# Patient Record
Sex: Female | Born: 1935 | Race: White | Hispanic: No | Smoking: Never smoker
Health system: Southern US, Community
[De-identification: ages and names within clinical notes are randomized; demographics above are authoritative.]

## PROBLEM LIST (undated history)

## (undated) DIAGNOSIS — I219 Acute myocardial infarction, unspecified: Secondary | ICD-10-CM

## (undated) DIAGNOSIS — N179 Acute kidney failure, unspecified: Secondary | ICD-10-CM

## (undated) DIAGNOSIS — J189 Pneumonia, unspecified organism: Secondary | ICD-10-CM

## (undated) DIAGNOSIS — I10 Essential (primary) hypertension: Secondary | ICD-10-CM

## (undated) DIAGNOSIS — I509 Heart failure, unspecified: Secondary | ICD-10-CM

## (undated) DIAGNOSIS — K219 Gastro-esophageal reflux disease without esophagitis: Secondary | ICD-10-CM

## (undated) DIAGNOSIS — I341 Nonrheumatic mitral (valve) prolapse: Secondary | ICD-10-CM

## (undated) DIAGNOSIS — F0390 Unspecified dementia without behavioral disturbance: Secondary | ICD-10-CM

## (undated) DIAGNOSIS — I471 Supraventricular tachycardia, unspecified: Secondary | ICD-10-CM

## (undated) DIAGNOSIS — K922 Gastrointestinal hemorrhage, unspecified: Secondary | ICD-10-CM

## (undated) DIAGNOSIS — I059 Rheumatic mitral valve disease, unspecified: Secondary | ICD-10-CM

## (undated) DIAGNOSIS — K5520 Angiodysplasia of colon without hemorrhage: Secondary | ICD-10-CM

## (undated) DIAGNOSIS — N183 Chronic kidney disease, stage 3 unspecified: Secondary | ICD-10-CM

## (undated) DIAGNOSIS — N1832 Chronic kidney disease, stage 3b: Secondary | ICD-10-CM

## (undated) DIAGNOSIS — E876 Hypokalemia: Secondary | ICD-10-CM

## (undated) DIAGNOSIS — C189 Malignant neoplasm of colon, unspecified: Secondary | ICD-10-CM

## (undated) DIAGNOSIS — Z950 Presence of cardiac pacemaker: Secondary | ICD-10-CM

## (undated) DIAGNOSIS — M1990 Unspecified osteoarthritis, unspecified site: Secondary | ICD-10-CM

## (undated) DIAGNOSIS — Z8489 Family history of other specified conditions: Secondary | ICD-10-CM

## (undated) DIAGNOSIS — Z8701 Personal history of pneumonia (recurrent): Secondary | ICD-10-CM

## (undated) DIAGNOSIS — I350 Nonrheumatic aortic (valve) stenosis: Secondary | ICD-10-CM

## (undated) DIAGNOSIS — N390 Urinary tract infection, site not specified: Secondary | ICD-10-CM

## (undated) DIAGNOSIS — D649 Anemia, unspecified: Secondary | ICD-10-CM

## (undated) DIAGNOSIS — R011 Cardiac murmur, unspecified: Secondary | ICD-10-CM

## (undated) DIAGNOSIS — K31819 Angiodysplasia of stomach and duodenum without bleeding: Secondary | ICD-10-CM

## (undated) DIAGNOSIS — N189 Chronic kidney disease, unspecified: Secondary | ICD-10-CM

## (undated) DIAGNOSIS — I4891 Unspecified atrial fibrillation: Secondary | ICD-10-CM

## (undated) DIAGNOSIS — Z5189 Encounter for other specified aftercare: Secondary | ICD-10-CM

## (undated) DIAGNOSIS — I639 Cerebral infarction, unspecified: Secondary | ICD-10-CM

## (undated) DIAGNOSIS — IMO0001 Reserved for inherently not codable concepts without codable children: Secondary | ICD-10-CM

## (undated) HISTORY — PX: NASAL SINUS SURGERY: SHX719

## (undated) HISTORY — DX: Supraventricular tachycardia, unspecified: I47.10

## (undated) HISTORY — DX: Cerebral infarction, unspecified: I63.9

## (undated) HISTORY — DX: Chronic kidney disease, stage 3b: N18.32

## (undated) HISTORY — DX: Acute kidney failure, unspecified: N17.9

## (undated) HISTORY — DX: Urinary tract infection, site not specified: N39.0

## (undated) HISTORY — DX: Unspecified dementia, unspecified severity, without behavioral disturbance, psychotic disturbance, mood disturbance, and anxiety: F03.90

## (undated) HISTORY — PX: PACEMAKER INSERTION: SHX728

## (undated) HISTORY — DX: Nonrheumatic aortic (valve) stenosis: I35.0

## (undated) HISTORY — DX: Rheumatic mitral valve disease, unspecified: I05.9

## (undated) HISTORY — DX: Chronic kidney disease, unspecified: N18.9

## (undated) HISTORY — DX: Angiodysplasia of stomach and duodenum without bleeding: K31.819

## (undated) HISTORY — DX: Chronic kidney disease, stage 3 unspecified: N18.30

## (undated) HISTORY — DX: Presence of cardiac pacemaker: Z95.0

## (undated) HISTORY — DX: Nonrheumatic mitral (valve) prolapse: I34.1

## (undated) HISTORY — PX: APPENDECTOMY: SHX54

## (undated) HISTORY — DX: Malignant neoplasm of colon, unspecified: C18.9

## (undated) HISTORY — DX: Acute myocardial infarction, unspecified: I21.9

## (undated) HISTORY — PX: BLADDER SURGERY: SHX569

## (undated) HISTORY — PX: EYE SURGERY: SHX253

## (undated) HISTORY — PX: CARDIAC SURGERY: SHX584

## (undated) HISTORY — DX: Personal history of pneumonia (recurrent): Z87.01

## (undated) HISTORY — DX: Essential (primary) hypertension: I10

## (undated) HISTORY — DX: Gastrointestinal hemorrhage, unspecified: K92.2

## (undated) HISTORY — DX: Supraventricular tachycardia: I47.1

## (undated) HISTORY — DX: Hypokalemia: E87.6

## (undated) HISTORY — DX: Angiodysplasia of colon without hemorrhage: K55.20

## (undated) HISTORY — DX: Encounter for other specified aftercare: Z51.89

## (undated) SURGERY — ESOPHAGOGASTRODUODENOSCOPY (EGD) WITH PROPOFOL
Anesthesia: Monitor Anesthesia Care

---

## 1998-04-28 ENCOUNTER — Other Ambulatory Visit: Admission: RE | Admit: 1998-04-28 | Discharge: 1998-04-28 | Payer: Self-pay | Admitting: Family Medicine

## 1998-06-23 ENCOUNTER — Encounter: Payer: Self-pay | Admitting: Family Medicine

## 1998-06-23 ENCOUNTER — Ambulatory Visit (HOSPITAL_COMMUNITY): Admission: RE | Admit: 1998-06-23 | Discharge: 1998-06-23 | Payer: Self-pay | Admitting: Obstetrics & Gynecology

## 1999-07-25 ENCOUNTER — Ambulatory Visit (HOSPITAL_COMMUNITY): Admission: RE | Admit: 1999-07-25 | Discharge: 1999-07-25 | Payer: Self-pay | Admitting: Family Medicine

## 1999-07-25 ENCOUNTER — Encounter: Payer: Self-pay | Admitting: Family Medicine

## 2000-07-31 ENCOUNTER — Encounter: Payer: Self-pay | Admitting: Family Medicine

## 2000-07-31 ENCOUNTER — Ambulatory Visit (HOSPITAL_COMMUNITY): Admission: RE | Admit: 2000-07-31 | Discharge: 2000-07-31 | Payer: Self-pay | Admitting: Family Medicine

## 2001-08-22 ENCOUNTER — Encounter: Payer: Self-pay | Admitting: Obstetrics & Gynecology

## 2001-08-22 ENCOUNTER — Ambulatory Visit (HOSPITAL_COMMUNITY): Admission: RE | Admit: 2001-08-22 | Discharge: 2001-08-22 | Payer: Self-pay | Admitting: Obstetrics & Gynecology

## 2001-08-30 ENCOUNTER — Inpatient Hospital Stay (HOSPITAL_COMMUNITY): Admission: EM | Admit: 2001-08-30 | Discharge: 2001-08-30 | Payer: Self-pay | Admitting: Internal Medicine

## 2002-02-06 ENCOUNTER — Encounter (HOSPITAL_BASED_OUTPATIENT_CLINIC_OR_DEPARTMENT_OTHER): Payer: Self-pay | Admitting: Internal Medicine

## 2002-02-06 ENCOUNTER — Inpatient Hospital Stay (HOSPITAL_COMMUNITY): Admission: EM | Admit: 2002-02-06 | Discharge: 2002-02-08 | Payer: Self-pay | Admitting: *Deleted

## 2002-08-27 ENCOUNTER — Ambulatory Visit (HOSPITAL_COMMUNITY): Admission: RE | Admit: 2002-08-27 | Discharge: 2002-08-27 | Payer: Self-pay | Admitting: Obstetrics & Gynecology

## 2002-08-27 ENCOUNTER — Encounter: Payer: Self-pay | Admitting: Obstetrics & Gynecology

## 2003-08-30 ENCOUNTER — Ambulatory Visit (HOSPITAL_COMMUNITY): Admission: RE | Admit: 2003-08-30 | Discharge: 2003-08-30 | Payer: Self-pay | Admitting: Family Medicine

## 2004-09-01 ENCOUNTER — Ambulatory Visit (HOSPITAL_COMMUNITY): Admission: RE | Admit: 2004-09-01 | Discharge: 2004-09-01 | Payer: Self-pay | Admitting: Family Medicine

## 2005-09-03 ENCOUNTER — Ambulatory Visit (HOSPITAL_COMMUNITY): Admission: RE | Admit: 2005-09-03 | Discharge: 2005-09-03 | Payer: Self-pay | Admitting: Family Medicine

## 2006-08-19 ENCOUNTER — Ambulatory Visit: Payer: Self-pay | Admitting: Internal Medicine

## 2006-08-19 ENCOUNTER — Encounter (INDEPENDENT_AMBULATORY_CARE_PROVIDER_SITE_OTHER): Payer: Self-pay | Admitting: *Deleted

## 2006-08-19 ENCOUNTER — Ambulatory Visit (HOSPITAL_COMMUNITY): Admission: RE | Admit: 2006-08-19 | Discharge: 2006-08-19 | Payer: Self-pay | Admitting: Internal Medicine

## 2006-09-05 ENCOUNTER — Ambulatory Visit (HOSPITAL_COMMUNITY): Admission: RE | Admit: 2006-09-05 | Discharge: 2006-09-05 | Payer: Self-pay | Admitting: Family Medicine

## 2007-09-23 ENCOUNTER — Ambulatory Visit (HOSPITAL_COMMUNITY): Admission: RE | Admit: 2007-09-23 | Discharge: 2007-09-23 | Payer: Self-pay | Admitting: Family Medicine

## 2008-06-14 ENCOUNTER — Ambulatory Visit (HOSPITAL_COMMUNITY): Admission: RE | Admit: 2008-06-14 | Discharge: 2008-06-14 | Payer: Self-pay | Admitting: Ophthalmology

## 2008-09-24 ENCOUNTER — Ambulatory Visit (HOSPITAL_COMMUNITY): Admission: RE | Admit: 2008-09-24 | Discharge: 2008-09-24 | Payer: Self-pay | Admitting: Family Medicine

## 2009-09-26 ENCOUNTER — Ambulatory Visit (HOSPITAL_COMMUNITY): Admission: RE | Admit: 2009-09-26 | Discharge: 2009-09-26 | Payer: Self-pay | Admitting: Family Medicine

## 2010-08-28 ENCOUNTER — Other Ambulatory Visit: Payer: Self-pay

## 2010-08-28 DIAGNOSIS — Z139 Encounter for screening, unspecified: Secondary | ICD-10-CM

## 2010-09-29 ENCOUNTER — Ambulatory Visit (HOSPITAL_COMMUNITY)
Admission: RE | Admit: 2010-09-29 | Discharge: 2010-09-29 | Disposition: A | Discharge status: Home / Self Care | Payer: Medicare Other | Source: Ambulatory Visit | Attending: Family Medicine | Admitting: Family Medicine

## 2010-09-29 DIAGNOSIS — Z1231 Encounter for screening mammogram for malignant neoplasm of breast: Secondary | ICD-10-CM | POA: Insufficient documentation

## 2010-09-29 DIAGNOSIS — Z139 Encounter for screening, unspecified: Secondary | ICD-10-CM

## 2010-10-04 ENCOUNTER — Other Ambulatory Visit (HOSPITAL_COMMUNITY): Payer: Self-pay | Admitting: Family Medicine

## 2010-10-04 DIAGNOSIS — N6310 Unspecified lump in the right breast, unspecified quadrant: Secondary | ICD-10-CM

## 2010-10-11 ENCOUNTER — Ambulatory Visit (HOSPITAL_COMMUNITY)
Admission: RE | Admit: 2010-10-11 | Discharge: 2010-10-11 | Disposition: A | Discharge status: Home / Self Care | Payer: Medicare Other | Source: Ambulatory Visit | Attending: Family Medicine | Admitting: Family Medicine

## 2010-10-11 ENCOUNTER — Other Ambulatory Visit (HOSPITAL_COMMUNITY): Payer: Self-pay | Admitting: Family Medicine

## 2010-10-11 DIAGNOSIS — N6310 Unspecified lump in the right breast, unspecified quadrant: Secondary | ICD-10-CM

## 2010-10-11 DIAGNOSIS — R928 Other abnormal and inconclusive findings on diagnostic imaging of breast: Secondary | ICD-10-CM | POA: Insufficient documentation

## 2010-10-11 DIAGNOSIS — N6009 Solitary cyst of unspecified breast: Secondary | ICD-10-CM | POA: Insufficient documentation

## 2010-11-10 NOTE — Discharge Summary (Signed)
Crystal Cooper. [facility]  Patient:    Crystal Cooper, Crystal Cooper Visit Number: [id number] MRN: [id number]          Service Type: MED Location: [id number] Attending Physician:  Cristopher Peru Dictated by:   Mikey Bussing, P.A. Admit Date:  08/29/2001 Disc. Date: 08/30/01   CC:         Youlanda Roys. Deatra Ina, M.D.   Referring Physician Discharge [place]:  August 31, 1926  BRIEF HISTORY:  This is a patient referred to Dr. Lovena Le for spells of rapid heart rate.  Her symptoms date back to age 65.  She has occasional "spells" which were rare and typically did not last long and were associated with a feeling of weakness.  She has lived with these for quite some time.  In 1989 she was at work and had the sensation of her heart racing and was subsequently diagnosed with mitral valve prolapse and started on beta blockers.  Over the six months prior to this admission she had two prolonged episodes of rapid heart rate which occurred suddenly and ended suddenly.  She has had many episodes which will last for brief periods of time - typically one to two minutes.  Two spells have been associated with hypotension with systolic blood pressures in the 85 to 90 range and heart rate between 200 and 240 beats per minute.  She has worn a 30-day monitor and was found to have no symptomatic arrhythmias while wearing the monitor.  She was referred for evaluation.  Dr. Lovena Le suspected that she had SVT and recommended radiofrequency ablation. She was admitted to [facility] for this procedure.  PAST MEDICAL HISTORY:  Significant for hypertension, remote history of rheumatic fever as a child, history of bladder surgery as well as sinus surgery, and she is status post appendectomy.  She has had three spontaneous vaginal deliveries.  ALLERGIES:  CODEINE.  MEDICATIONS PRIOR TO ADMISSION:  Altace 10 mg daily.  SOCIAL HISTORY:  The patient lives alone.  She has supportive family  members. She denies tobacco or alcohol use.  She still drives.  She no longer uses caffeine.  HOSPITAL COURSE:  As noted, this patient was admitted to [facility] for a radiofrequency ablation for SVT.  She was taken to the EP lab on the day of admission and underwent ablation performed by Dr. Lovena Le, which she tolerated well.  Arrangements were made to discharge the patient the following day; however, she had some nausea and vomiting.  She was kept in the hospital until August 30, 2001.  She is currently feeling better at this time.  She had a Foley catheter which was removed; however, she has not voided since the catheter was removed.  If she is able to void, she will be discharged later today.  LABORATORY DATA:  An EKG on March 8 showed normal sinus rhythm, rate 69 beats per minute, and was interpreted as a normal EKG.  DISCHARGE MEDICATIONS: 1. Altace 10 mg daily. 2. Enteric-coated aspirin 325 mg daily for six weeks. 3. The patient was to be on SBE prophylaxis with antibiotics prior to any    surgical or dental procedures x 3 months.  ACTIVITY:  The patient was told to avoid any strenuous activity or heavy lifting for four days, no driving for two days.  DIET:  She was to be on a low fat/low cholesterol/low salt diet.  WOUND CARE:  She was told she could remove her dressings  and shower when she was back home.  She was to call if she had any drainage from her wound.  FOLLOW-UP:  The patient was to follow up with [facility] in six to eight weeks; the office will call her for an appointment.  She will follow up with Dr. Deatra Ina as needed or as scheduled.  PROBLEM LIST AT TIME OF DISCHARGE: 1. Supraventricular tachycardia, symptomatic since age 71. 2. Status post radiofrequency ablation performed August 29, 2001. 3. History of hypertension. Dictated by:   Mikey Bussing, P.A. Attending Physician:  Cristopher Peru DD:  08/30/01 TD:  08/30/01 Job: [id number]

## 2010-11-10 NOTE — Discharge Summary (Signed)
NAME:  Crystal Cooper, Crystal Cooper                         ACCOUNT NO.:  [id number]   MEDICAL RECORD NO.:  [id number]                   PATIENT TYPE:  INP   LOCATION:  5151                                 FACILITY:  [facility]   PHYSICIAN:  Ricard Dillon, M.D.             DATE OF BIRTH:  10-07-1926   DATE OF ADMISSION:  02/06/2002  DATE OF DISCHARGE:  02/08/2002                                 DISCHARGE SUMMARY   ID:  This is a 77 year old female patient.   CHIEF COMPLAINT:  Coughing x 1 month.  Vomiting x 3 days.   HISTORY OF PRESENT ILLNESS:  This is a normally 77 year old healthy woman  who has been sick for one month.  This started with episodes of shortness of  breath and intense spells of coughing productive of white sputum.  There is  no fever.  No hemoptysis.  She complained of sinus congestion but no GERD-  like symptoms.  She has no history of asthma or COPD.  She is a nonsmoker  but a long history of second-hand smoke exposure.  She was seen in the  primary care office 10 days ago and prescribed treatment for bronchitis  which did not help.  She was seen again in the office on Monday, was given  Cipro and prednisone and an intramuscular injury presumably Solu-Medrol.  She felt better on Tuesday, however, she started vomiting Tuesday night.  Most of the vomiting sounded post tussive although I wondered whether some  of this could have had a more primary cause on admission.  She denies  hemoptysis, abdominal pain or dysphagia.   On arrival to ER her chest x-ray was found to be normal, however, he sodium  was 117.   PAST MEDICAL HISTORY:  1. Hypertension.  2. Rheumatic fever.  3. Sinus surgery.  4. Appendectomy remotely.  5. SVT status post radiofrequency ablation August 29, 2001 (Dr. Lovena Le).   MEDICATIONS ON ADMISSION:  Altace 10 mg q.d., prednisone 40 mg p.o. q.d.  started on Monday, Cipro 500 mg b.i.d.   SOCIAL HISTORY:  She lives alone.  No recent changes in environment.   PHYSICAL EXAMINATION:  VITAL SIGNS:  Temperature 98.9, pulse 66,  respirations 22.  O2 sats were 97% on room air.  HEENT:  Edentulous.  NECK:  Normal.  CARDIOVASCULAR:  Heart sounds were normal, soft, S4.  No murmurs.  No  bruits.  RESPIRATORY:  Air entry was normal.  No wheezes or crackles were heard.  ABDOMEN:  No liver.  No spleen.  She was nontender.  BREASTS:  Deferred as she had a normal mammogram in January '03.  EXTREMITIES:  Normal peripheral pulses.  No evidence of a DVT.  CNS:  Equal strength bilaterally.  She is alert and oriented.   LABORATORY WORK:  On admission sodium was 117, potassium 3.5, chloride 81,  CO2 28, BUN 8, creatinine 0.7.  White count 10, hemoglobin  13, platelets  326.  Her liver function tests were normal.  Calcium was 8.9, albumin 3.6.  Total CK 109.  Lipase was 42.  A PA and lateral chest x-ray was done which  was normal.  Sinus films were normal.  EKG showed poor R wave progression  that was not new, slight inversion of T waves in the high lateral chest  leads were also felt probably to be chronic.   LABORATORY AND OTHER INVESTIGATIONS:  Sodium on admission was 117.  Urine  osmolality was 196.  Serum osmolality was 244.  Urine sodium was 71.  Sodium  was repeated on 02/07/02 and this was 129, potassium 4.5.  Her CBC was  normal.   COURSE IN THE HOSPITAL:  This patient was admitted with what was felt to be  asthmatic bronchitis.  This settled down with Ventolin nebulizers, however,  the patient is still complaining of episodic spasms of coughing and a  sensation of tightness in her chest.  She certainly looks considerably  better than on admission, however.  We felt that she would be best treated  with bronchodilators and inhaled steroids.  Due to cost considerations, I  did not order an Advair diskus, however, we will discharge her on Flovent  and Ventolin inhalers.  She might be able to get samples from her primary  care office this week.   The  vomiting settled down on its own.  She is tolerating an oral diet.  I  think most of this was post tussive.   With regards to the hyponatremia I think this is largely secondary to one  and two above.  Her studies were not compatible with SIADH and this  corrected with isotonic fluid and continues to correct even after IVs were  stopped.   IMPRESSION:  1. Asthmatic bronchitis.  I am uncertain whether this is woman is going to     have further problems with this or this could be an isolated episode.  2. No evidence of an infectious component here.  She is not discharged on     antibiotics.  3. The vomiting is largely post tussive.  4. Hyponatremia currently resolved.   DISCHARGE MEDICATIONS:  Flovent inhaler 44 two puffs b.i.d.  Ventolin  inhaler 2 puffs q.i.d. and p.r.n.  Her Altace that she was on on admission  is on hold because of the cough variant.  I really doubt that this is what  is behind this.  I will leave this to Dr. Deatra Ina to restart as necessary and  follow.   DISPOSITION:  The patient is discharged in stable condition.  Advised to  follow up with primary, Dr. Deatra Ina, this week.                                               Ricard Dillon, M.D.    MGR/MEDQ  D:  02/08/2002  T:  02/11/2002  Job:  [id number]   cc:   Dr. Deatra Ina

## 2010-11-10 NOTE — H&P (Signed)
NAME:  Crystal Cooper, Crystal Cooper NO.:  [id number]   MEDICAL RECORD NO.:  [id number]                   PATIENT TYPE:  EMS   LOCATION:  MAJO                                 FACILITY:  [facility]   PHYSICIAN:  Ricard Dillon, M.D.             DATE OF BIRTH:  [birth year]   DATE OF ADMISSION:  02/06/2002  DATE OF DISCHARGE:                                HISTORY & PHYSICAL   ID:  This is a 77 year old woman who is a primary patient of Dr. Kaplan's,  [place].   CHIEF COMPLAINT:  Coughing times one month, vomiting times three days.   HISTORY OF PRESENT ILLNESS:  This is a normally healthy, 77 year old woman  who has been sick roughly for a month.  This started with spasms of episodic  shortness of breath and intense spells of coughing productive of whitish  sputum.  She had no fever, no hemoptysis, no gastroesophageal reflux  symptoms, but did have sinus congestion and drainage.  There is no history  of asthma or COPD, although, during her married life she had an intense  secondary smoke exposure.  She was seen in her primary MD's office 10 days  ago and prescribed Amoxil for bronchitis, which did not help.  She was seen  again in the office on Monday of this week was given Cipro, p.o. Prednisone,  and IM injection (presumably steroids).  She felt better on Tuesday,  however, started vomiting intensely Tuesday night and could not keep  anything on her stomach.  Most of this sounds posttussive, however, she was  also nauseated predominantly independent of coughing.  She has been unable  to keep much down.  There is no hemoptysis, no abdominal pain, no dysphagia,  and no dysuria.   She has been arrived in the ER after vomiting and coughing all night.  Her  sodium is 117.  Chest x-ray PA and lateral has been done, which is entirely  normal.   PAST MEDICAL HISTORY:  Hypertension on all Altace, rheumatic fever as a  child, sinus surgery unknown date  and surgeon, appendectomy, remotely  supraventricular tachycardia status-post radiofrequency ablation on  08/29/2001, (Dr. Cristopher Peru).   CURRENT MEDICATIONS:  Altace 10 mg p.o. q.d., Prednisone 22 p.o. q.d.  started on Monday, Cipro 500 b.i.d. also started on Monday.   SOCIAL HISTORY:  She lives alone.  No recent changes in her environment  exposures, etc.   PHYSICAL EXAMINATION:  VITAL SIGNS:  Temperature 98.9, blood pressure  166/82, pulse 70, respirations 16, O2 sats 97%.  HEENT:  She is edentulous.  Mucus membranes are moist.  NECK:  Normal.  No masses felt.  CVS:  S1, S2 normal.  Positive S4 is heard.  There is no murmurs, no bruits.  She appears euvolemic.  RESPIRATORY:  Air entry is quite normal.  There is no wheezing.  No  crackles.  Forced expiratory maneuver  was attempted.  Again I did not  appreciate significant bronchospasm.  ABDOMEN:  No liver.  No spleen.  She is nontender.  BREAST:  Deferred, as she had a normal mammogram in January 2003.  EXTREMITIES:  Normal peripheral pulses.  There is no edema.  CNS:  Equal strength.  She is alert and oriented.  Nothing seems focal.   LABORATORY WORK TO DATE:  Sodium 117, potassium 3.5, chloride 81, CO2 28,  BUN 8, creatinine 0.7, white count 10, hemoglobin 13, platelets 326, liver  function tests are normal, calcium 8.9, albumin 3.6, lipase is normal at 42,  total CK is 109.  Chest x-ray PA and lateral shows no evidence of an acute  infiltrate.  EKG shows sinus rhythm.  Poor R wave progression (not new since  March).  She has developed T wave inversion in the high lateral leads.  This  was flat in March or isoelectric in March, perhaps somewhat worse, although,  there is no evidence of exertional chest symptoms.   IMPRESSION:  1. Probable asthmatic bronchitis secondary to a viral illness.  There is no     evidence of bacterial pneumonia or bronchitis at present.  I cannot     completely rule out other illnesses such as  gastroesophageal reflux,     sinusitis, etc.  2. Intense vomiting.  Most of this sound posttussive probably secondary to     #1, however, she is still nauseated now.  I would also wonder about a     drug effect, Quinolones or Prednisone, both can do this.  Her abdomen     seems benign.  There is no reason at this point to suspect a primary GI     problem here, although, one would wonder about esophageal reflux     contributing to her coughing.  3. Hyponatremia.  I think this is probably secondary largely to #1 and #2.     As such should correct with isotonic fluid.  It seems reasonable to do a     spot urine for sodium and osmolality, however.   PLAN:  She will see nebulizers, fluids, and antibiotics.  I see no need for  IV steroids now at present, as I do not appreciate any wheezing.  However,  ultimately PFTs may prove useful here.                                               Ricard Dillon, M.D.    MGR/MEDQ  D:  02/06/2002  T:  02/09/2002  Job:  [id number]   cc:   Youlanda Roys. Deatra Ina, M.D.

## 2010-11-10 NOTE — Op Note (Signed)
Crystal Cooper, Crystal Cooper               ACCOUNT NO.:  [id number]   MEDICAL RECORD NO.:  [id number]          PATIENT TYPE:  AMB   LOCATION:  DAY                           FACILITY:  [facility]   PHYSICIAN:  R. Garfield Cornea, M.D. DATE OF BIRTH:  08-07-1926   DATE OF PROCEDURE:  08/19/2006  DATE OF DISCHARGE:                               OPERATIVE REPORT   PROCEDURE:  Colonoscopy with biopsy.   INDICATIONS FOR PROCEDURE:  The patient is a 77 year old lady referred  at courtesy Dr. Tollie Pizza over at [facility] for  colorectal cancer screening.  She has never had her lower GI tract  imaged previously.  There is no family history of colon cancer, and she  has no lower GI tract symptoms.  Colonoscopy is now being done as a  screening maneuver.  This approach has been discussed with the patient  at length.  Potential risks, benefits, and alternatives have been  reviewed.  Please see the documentation in the medical record.   PROCEDURE NOTE:  O2 saturation, blood pressure, pulse was monitored  throughout the entire procedure.  Conscious sedation Versed 4 mg IV  Demerol 50 mg IV in divided doses.  Instrument Pentax video chip system.   FINDINGS:  Digital rectal exam revealed no abnormalities.   Endoscopic Findings:  Prep was good.  Scope was advanced from the  rectosigmoid junction through the left transverse right colon to the  area of appendiceal orifice, ileocecal valve and cecum where these  structures were well seen and photographed for the record.  From this  level, the scope was slowly withdrawn, and all previous mentioned  mucosal surfaces were again seen.   The patient had the following abnormalities:  1. Scattered sigmoid diverticula.  2. Two diminutive 3 mm polyps at the hepatic flexure and 2 in the      descending colon, all of which were cold biopsy/removed.  The      remainder of colonic mucosa appeared normal.  Scope was then the      rectum where a thoroughly  examination of the rectal mucosa      including retroflex view of the anal verge was undertaken.  The      rectal mucosa appeared normal.  The patient tolerated the procedure      well, was reacted in endoscopy.   IMPRESSION:  Normal rectum, sigmoid diverticula diminutive colonic  polyps removed as described above.  Remainder of colonic mucosa appeared  normal.   RECOMMENDATIONS:  1. Diverticulosis literature provided to [name].  2. Follow up on path.  3. Further recommendations to follow.      Bridgette Habermann, M.D.  Electronically Signed     RMR/MEDQ  D:  08/19/2006  T:  08/19/2006  Job:  [id number]   cc:   Juanita Craver, M.D.  Fax: [phone]

## 2010-11-10 NOTE — Op Note (Signed)
[place]  Patient:    Crystal Cooper, Crystal Cooper Visit Number: [id number] MRN: [id number]          Service Type: MED Location: [id number] Attending Physician:  Cristopher Peru Dictated by:   Champ Mungo. Lovena Le, M.D. [facility] Proc. Date: 08/29/01 Admit Date:  08/29/2001 Discharge Date: 08/30/2001   CC:         Youlanda Roys. Deatra Ina, M.D.  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  INDICATION:  Recurrent tachypalpitations and documented heart rates in the 200 range.  I. INTRODUCTION:  The patient is a very pleasant 77 year old young lady who was referred by Dr. Elson Clan for evaluation of tachypalpitations.  The patient states that her symptoms start and stop suddenly and have been present off and on for many, many years.  While wearing an automatic blood pressure cuff, her heart rate was said to be 220 beats per minute with a blood pressure of 85 associated with dizziness and palpitations.  There was no actual documented SVT, but because her symptoms are very typical for SVT, and she had a documentation of very rapid heart rate, she is referred for electrophysiologic testing.  PHYSICIAN:  Champ Mungo. Lovena Le, M.D.  II. PROCEDURE:  After informed consent was obtained, the patient was taken to the diagnostic EP lab in the fasting state.  After the usual preparation and draping, intravenous fentanyl and midazolam was given for sedation.  A 6-French hexapolar catheter was inserted percutaneously in the right jugular vein and advanced to the coronary sinus.  A 5-French quadripolar catheter was inserted percutaneously in the right femoral vein and advanced to the RV apex. A 5-French quadripolar catheter was inserted percutaneously in the right femoral vein and advanced to the His bundle region.  After measurement of the basic intervals,  rapid ventricular pacing was carried out from the RV apex at a pacing cycle length of 500 msec and stepwise decreased down to 310 msec where V-A Wenckebach was observed.  During rapid ventricular pacing, the atrial activation sequence was midline and decremental.  Next, programmed ventricular stimulation was carried out from the RV apex at a base adjusted cycle length of 500 msec.  The S1 and S2 interval was stepwise decreased from 440 msec down to 230 msec where the retrograde A-V node ERP was observed. During programmed ventricular stimulation, the atrial activation sequence was again midline and decremental.  Next, programmed atrial stimulation was carried out from the coronary sinus with a base adjusted cycle length of 500 msec.  The S1-S2 interval was stepwise decreased down to 210 msec where the ERP of the A-V node was observed.  It should be noted that during programmed atrial stimulation, there were multiple A-H jumps and echo beats including double and triple echo beats.  Next, rapid atrial pacing was carried out from the coronary sinus with a base adjusted cycle length of 500 msec and stepwise decreased down 390 msec where A-V Wenckebach was observed.  During rapid atrial pacing, there was nonsustained SVT.  Isoproterenol at 10 cc/min (approximately 0.5 mcg per minute) was subsequently infused, and additional rapid atrial pacing resulted in the initiation of SVT.  The tachycardia cycle length varied between 280 and 343 msec.  During tachycardia, mapping demonstrated the earliest atrial activation to be in the His bundle region. The V-A interval was very short, less than 30 msec.  PVCs placed at  the time of His bundle refractions did not result in pre-excitation of the atrium, and the ventricular pacing resulted in a VAV conduction sequence.  In addition, the atrial activation was midline.  With the diagnosis of A-V node re-entrant tachycardia in hand, the 7-French quadripolar  ablation catheter was inserted through the right femoral vein and advanced into the right atrium.  Mapping was carried out in the right atrium at sites 7 and 8 in Kochs triangle. Three RF energy applications were delivered.  During RF energy application, there were prolonged episodes of accelerated junctional rhythm.  Following this, additional rapid atrial pacing was carried out demonstrating no evidence of any residual dual A-V nodal physiology.  The A-V Wenckebach cycle length increased to 590 msec.  At this point, the catheters were removed.  Hemostasis was assured, and the patient was returned to her room in satisfactory condition.  III. COMPLICATIONS:  None.  IV:  RESULTS:      A. Baseline EHG.  The baseline EHG demonstrated normal sinus rhythm and         normal axis and intervals.      B. Baseline intervals.  The sinus node cycle length was 737 msec.         The A-H interval 91 msec, the H-V interval 36 msec, and the         Q-R-S restoration 94 msec.      C. Rapid ventricular pacing.  Rapid ventricular pacing was carried out         from the RV apex at a base adjusted cycle length of 500 msec and         stepwise decreased down to 310 msec where V-A Wenckebach was         observed.  During rapid ventricular pacing, the atrial activation         sequence was midline and decremental.      D. Programmed ventricular stimulation.  Programmed ventricular         stimulation was carried out from the RV apex at a base adjusted         cycle length of 500 msec.  The S1-S2 interval was stepwise decreased         from 440 msec down to 230 msec where the ERP of the ventricle was         observed.  During programmed ventricular stimulation, the atrial         activation sequence was again midline and decremental.  There were no         obvious V-A jumps.      E. Programmed atrial stimulation.  Programmed atrial stimulation was          carried out from the coronary sinus at a base  adjusted cycle         length of 500 msec.  The S1-S2 interval was stepwise decreased         from 440 msec down to 210 msec where the ERP of the A-V node         was observed.  During programmed atrial stimulation, there were         multiple A-H jump and echo and double echo beats.      F. Rapid atrial pacing.  Rapid atrial pacing was carried out from the         coronary sinus both with and without isoproterenol.  The A-V         Wenckebach cycle  length was 390 msec.  During isoproterenol infusion,         and with rapid atrial pacing, there was inducible A-V re-entrant         tachycardia.      G. Arrhythmias observed.         a. A-V node re-entrant tachycardia.  Initiation rapid            atrial pacing during isoproterenol infusion, duration            sustained, termination spontaneous and with ventricular            pacing.         b. Atrial fibrillation/flutter.  Initiation spontaneous            during SVT.  Duration sustained, termination spontaneous.      H. Mapping.  Mapping of the patients SVT demonstrated the earliest         atrial activation down in Kochs triangle.  In addition, mapping         demonstrated a normal size and orientation of Kochs triangle.      I. RF energy application.  A total of three RF energy applications were         subsequently delivered to sites 7 and 8 in Kochs triangle.  During         RF energy application, there was accelerated junctional rhythm.  V. CONCLUSIONS:  This study demonstrates inducible A-V node re-entrant tachycardia.  In addition, there was spontaneous occurring atrial fibrillation/flutter during A-V node re-entrant tachycardia.  The clinical significance of this arrhythmia is unknown.  Finally, after three RF energy applications, there was no evidence of any residual dual A-V nodal physiology. ictated by:   Champ Mungo. Lovena Le, M.D. [facility] Attending Physician:  Cristopher Peru DD:  08/29/01 TD:  09/01/01 Job: [id number]

## 2011-07-03 ENCOUNTER — Encounter: Payer: Self-pay | Admitting: Cardiology

## 2011-07-26 ENCOUNTER — Encounter: Payer: Self-pay | Admitting: Internal Medicine

## 2011-09-06 ENCOUNTER — Other Ambulatory Visit (HOSPITAL_COMMUNITY): Payer: Self-pay | Admitting: Family Medicine

## 2011-09-06 DIAGNOSIS — Z139 Encounter for screening, unspecified: Secondary | ICD-10-CM

## 2011-09-24 ENCOUNTER — Encounter: Payer: Self-pay | Admitting: Cardiology

## 2011-10-12 ENCOUNTER — Ambulatory Visit (HOSPITAL_COMMUNITY)
Admission: RE | Admit: 2011-10-12 | Discharge: 2011-10-12 | Disposition: A | Discharge status: Home / Self Care | Payer: Medicare Other | Source: Ambulatory Visit | Attending: Family Medicine | Admitting: Family Medicine

## 2011-10-12 DIAGNOSIS — Z139 Encounter for screening, unspecified: Secondary | ICD-10-CM

## 2011-10-12 DIAGNOSIS — Z1231 Encounter for screening mammogram for malignant neoplasm of breast: Secondary | ICD-10-CM | POA: Insufficient documentation

## 2011-10-18 ENCOUNTER — Encounter: Payer: Self-pay | Admitting: Cardiology

## 2011-10-18 ENCOUNTER — Encounter: Payer: Self-pay | Admitting: *Deleted

## 2011-10-19 ENCOUNTER — Ambulatory Visit (HOSPITAL_COMMUNITY): Payer: Medicare Other | Attending: Internal Medicine

## 2011-10-19 ENCOUNTER — Ambulatory Visit (INDEPENDENT_AMBULATORY_CARE_PROVIDER_SITE_OTHER): Payer: Medicare Other | Admitting: Cardiology

## 2011-10-19 ENCOUNTER — Encounter: Payer: Self-pay | Admitting: Cardiology

## 2011-10-19 ENCOUNTER — Other Ambulatory Visit: Payer: Self-pay

## 2011-10-19 VITALS — BP 152/80 | HR 80 | Ht 64.0 in | Wt 126.0 lb

## 2011-10-19 DIAGNOSIS — R011 Cardiac murmur, unspecified: Secondary | ICD-10-CM | POA: Insufficient documentation

## 2011-10-19 DIAGNOSIS — I059 Rheumatic mitral valve disease, unspecified: Secondary | ICD-10-CM | POA: Insufficient documentation

## 2011-10-19 DIAGNOSIS — I10 Essential (primary) hypertension: Secondary | ICD-10-CM

## 2011-10-19 DIAGNOSIS — Z9889 Other specified postprocedural states: Secondary | ICD-10-CM | POA: Insufficient documentation

## 2011-10-19 DIAGNOSIS — R0609 Other forms of dyspnea: Secondary | ICD-10-CM | POA: Insufficient documentation

## 2011-10-19 DIAGNOSIS — R079 Chest pain, unspecified: Secondary | ICD-10-CM | POA: Insufficient documentation

## 2011-10-19 DIAGNOSIS — R0989 Other specified symptoms and signs involving the circulatory and respiratory systems: Secondary | ICD-10-CM | POA: Insufficient documentation

## 2011-10-19 DIAGNOSIS — I498 Other specified cardiac arrhythmias: Secondary | ICD-10-CM

## 2011-10-19 DIAGNOSIS — I471 Supraventricular tachycardia: Secondary | ICD-10-CM

## 2011-10-19 NOTE — Assessment & Plan Note (Signed)
Continue present blood pressure medications. 

## 2011-10-19 NOTE — Assessment & Plan Note (Signed)
Patient sounds to have an aortic stenosis murmur. Plan echocardiogram to better assess.

## 2011-10-19 NOTE — Progress Notes (Signed)
  HPI: 78 year-old female for evaluation of murmur. Patient does have a history of ablation of AVNRT in 2003. She does have chronic dyspnea on exertion but no orthopnea or PND. Occasional mild pedal edema. No syncope. She also has problems with chest pain. It is substernal and described as a pressing pain. No radiation. Last several minutes and resolve spontaneously. Not exertional. No associated symptoms. She was recently noted to have a worsening murmur. Cardiology was asked to further evaluate.  Current Outpatient Prescriptions  Medication Sig Dispense Refill  . albuterol (PROVENTIL HFA;VENTOLIN HFA) 108 (90 BASE) MCG/ACT inhaler Inhale 2 puffs into the lungs every 4 (four) hours as needed.      [name] amLODipine (NORVASC) 5 MG tablet Take 5 mg by mouth daily.      . MULTIPLE VITAMIN PO Take by mouth.        Allergies  Allergen Reactions  . Codeine   . Hctz (Hydrochlorothiazide)     Past Medical History  Diagnosis Date  . HTN (hypertension)   . Asthma   . Glaucoma   . Hearing loss   . Prolapsing mitral leaflet syndrome   . SVT (supraventricular tachycardia)     S/P ablation of AVNRT in 2003    Past Surgical History  Procedure Date  . Appendectomy   . Bladder surgery   . Cardiac surgery   . Nasal sinus surgery     History   Social History  . Marital Status: Widowed    Spouse Name: N/A    Number of Children: 3  . Years of Education: N/A   Occupational History  . Not on file.   Social History Main Topics  . Smoking status: Never Smoker   . Smokeless tobacco: Not on file  . Alcohol Use: No  . Drug Use: No  . Sexually Active: Not on file   Other Topics Concern  . Not on file   Social History Narrative  . No narrative on file    Family History  Problem Relation Age of Onset  . Coronary artery disease Brother     MI at age 41    ROS: Some arthralgias but no fevers or chills, productive cough, hemoptysis, dysphasia, odynophagia, melena, hematochezia, dysuria,  hematuria, rash, seizure activity, orthopnea, PND, claudication. Remaining systems are negative.  Physical Exam:   Blood pressure 152/80, pulse 80, height 5\' 4"  (1.626 m), weight 57.153 kg (126 lb).  General:  Well developed/somewhat frail in NAD Skin warm/dry Patient not depressed No peripheral clubbing Back-normal HEENT-normal/normal eyelids Neck supple/normal carotid upstroke bilaterally; no JVD; no thyromegaly chest - CTA/ normal expansion CV - RRR/normal S1 and S2; no rubs or gallops;  PMI nondisplaced; 3/6 systolic murmur left sternal border. S2 is not diminished. There is radiation to the carotids. Abdomen -NT/ND, no HSM, no mass, + bowel sounds, no bruit 2+ femoral pulses, no bruits Ext-trace edema, no chords, 2+ DP, varicosities noted Neuro-grossly nonfocal  ECG normal sinus rhythm with no ST changes.

## 2011-10-19 NOTE — Patient Instructions (Signed)
Your physician wants you to follow-up in: [place] will receive a reminder letter in the mail two months in advance. If you don't receive a letter, please call our office to schedule the follow-up appointment.   Your physician has requested that you have a lexiscan myoview. For further information please visit [email or web address]. Please follow instruction sheet, as given.   Your physician has requested that you have an echocardiogram. Echocardiography is a painless test that uses sound waves to create images of your heart. It provides your doctor with information about the size and shape of your heart and how well your heart's chambers and valves are working. This procedure takes approximately one hour. There are no restrictions for this procedure.

## 2011-10-19 NOTE — Assessment & Plan Note (Signed)
Symptoms atypical. Schedule Myoview for risk stratification.

## 2011-10-19 NOTE — Assessment & Plan Note (Signed)
Status post ablation. 

## 2011-11-01 ENCOUNTER — Other Ambulatory Visit (HOSPITAL_COMMUNITY): Payer: Medicare Other

## 2011-12-31 ENCOUNTER — Ambulatory Visit (HOSPITAL_COMMUNITY): Payer: Medicare Other | Attending: Cardiology | Admitting: Radiology

## 2011-12-31 VITALS — BP 143/79 | HR 74 | Ht 64.0 in | Wt 127.0 lb

## 2011-12-31 DIAGNOSIS — Z8249 Family history of ischemic heart disease and other diseases of the circulatory system: Secondary | ICD-10-CM | POA: Insufficient documentation

## 2011-12-31 DIAGNOSIS — R0609 Other forms of dyspnea: Secondary | ICD-10-CM | POA: Insufficient documentation

## 2011-12-31 DIAGNOSIS — I4949 Other premature depolarization: Secondary | ICD-10-CM

## 2011-12-31 DIAGNOSIS — I10 Essential (primary) hypertension: Secondary | ICD-10-CM | POA: Insufficient documentation

## 2011-12-31 DIAGNOSIS — R0602 Shortness of breath: Secondary | ICD-10-CM

## 2011-12-31 DIAGNOSIS — R002 Palpitations: Secondary | ICD-10-CM | POA: Insufficient documentation

## 2011-12-31 DIAGNOSIS — R0789 Other chest pain: Secondary | ICD-10-CM | POA: Insufficient documentation

## 2011-12-31 DIAGNOSIS — R079 Chest pain, unspecified: Secondary | ICD-10-CM

## 2011-12-31 DIAGNOSIS — R0989 Other specified symptoms and signs involving the circulatory and respiratory systems: Secondary | ICD-10-CM | POA: Insufficient documentation

## 2011-12-31 DIAGNOSIS — R011 Cardiac murmur, unspecified: Secondary | ICD-10-CM

## 2011-12-31 MED ORDER — REGADENOSON 0.4 MG/5ML IV SOLN
0.4000 mg | Freq: Once | INTRAVENOUS | Status: AC
  Administered 2011-12-31: 0.4 mg via INTRAVENOUS

## 2011-12-31 MED ORDER — TECHNETIUM TC 99M TETROFOSMIN IV KIT
11.0000 | PACK | Freq: Once | INTRAVENOUS | Status: AC | PRN
  Administered 2011-12-31: 11 via INTRAVENOUS

## 2011-12-31 MED ORDER — TECHNETIUM TC 99M TETROFOSMIN IV KIT
33.0000 | PACK | Freq: Once | INTRAVENOUS | Status: AC | PRN
  Administered 2011-12-31: 33 via INTRAVENOUS

## 2011-12-31 NOTE — Progress Notes (Signed)
[place] [address] [phone]  Cardiology Nuclear Med Study  Crystal Cooper is a 78 y.o. female     MRN : [id number]     DOB: 03-25-1927  Procedure Date: 12/31/2011  Nuclear Med Background Indication for Stress Test:  Evaluation for Ischemia. History:  '03 Ablation for SVT; 10/19/11 Echo:EF=60-65%, mild to moderate AS. Cardiac Risk Factors: Family History - CAD and Hypertension.  Symptoms:  Chest/Pressure.  (last episode of chest discomfort was last week), DOE/SOB and Palpitations    Nuclear Pre-Procedure Caffeine/Decaff Intake:  None NPO After: 7:00pm   Lungs:  Clear. O2 Sat: 96% on room air. IV 0.9% NS with Angio Cath:  22g  IV Site: R Hand  IV Started by:  Matilde Haymaker, RN  Chest Size (in):  36 Cup Size: B  Height: 5\' 4"  (1.626 m)  Weight:  127 lb (57.607 kg)  BMI:  Body mass index is 21.80 kg/(m^2). Tech Comments:  n/a    Nuclear Med Study 1 or 2 day study: 1 day  Stress Test Type:  Lexiscan  Reading MD: Darlin Coco, MD  Order Authorizing Provider:  [name], MD  Resting Radionuclide: Technetium 34m Tetrofosmin  Resting Radionuclide Dose: 10.1 mCi   Stress Radionuclide:  Technetium 25m Tetrofosmin  Stress Radionuclide Dose: 31.4 mCi           Stress Protocol Rest HR: 74 Stress HR: 108  Rest BP: 143/79 Stress BP: 145/81  Exercise Time (min): n/a METS: n/a   Predicted Max HR: 138 bpm % Max HR: 78.26 bpm Rate Pressure Product: 15660   Dose of Adenosine (mg):  n/a Dose of Lexiscan: 0.4 mg  Dose of Atropine (mg): n/a Dose of Dobutamine: n/a mcg/kg/min (at max HR)  Stress Test Technologist: Letta Moynahan, CMA-N  Nuclear Technologist:  Charlton Amor, CNMT     Rest Procedure:  Myocardial perfusion imaging was performed at rest 45 minutes following the intravenous administration of Technetium 27m Tetrofosmin.  Rest ECG: No acute changes and occasional PVC's.  Stress Procedure:  The  patient received IV Lexiscan 0.4 mg over 15-seconds.  Technetium 72m Tetrofosmin injected at 30-seconds.  There were nonspecific ST-T wave changes, a very brief episode of AVB vs a pause and occasional PVC's/PAC's with Lexiscan.  She also c/o chest tightness, 8/10.  Quantitative spect images were obtained after a 45 minute delay.  Stress ECG: No significant change from baseline ECG  QPS Raw Data Images:  Normal; no motion artifact; normal heart/lung ratio. Stress Images:  Normal homogeneous uptake in all areas of the myocardium. Rest Images:  Normal homogeneous uptake in all areas of the myocardium. Subtraction (SDS):  No evidence of ischemia. Transient Ischemic Dilatation (Normal <1.22):  0.98 Lung/Heart Ratio (Normal <0.45):  0.29  Quantitative Gated Spect Images QGS EDV:  66 ml QGS ESV:  16 ml  Impression Exercise Capacity:  Lexiscan with no exercise. BP Response:  Normal blood pressure response. Clinical Symptoms:  There is dyspnea. Patient had panic attack with dyspnea at end of infusion. ECG Impression:  No significant ST segment change suggestive of ischemia. Comparison with Prior Nuclear Study: No previous nuclear study performed  Overall Impression:  Normal stress nuclear study.  LV Ejection Fraction: 76%.  LV Wall Motion:  NL LV Function; NL Wall Motion  [employer]

## 2012-09-08 ENCOUNTER — Other Ambulatory Visit (HOSPITAL_COMMUNITY): Payer: Self-pay | Admitting: Family Medicine

## 2012-09-08 DIAGNOSIS — Z139 Encounter for screening, unspecified: Secondary | ICD-10-CM

## 2012-10-14 ENCOUNTER — Ambulatory Visit (HOSPITAL_COMMUNITY)
Admission: RE | Admit: 2012-10-14 | Discharge: 2012-10-14 | Disposition: A | Discharge status: Home / Self Care | Payer: Medicare Other | Source: Ambulatory Visit | Attending: Family Medicine | Admitting: Family Medicine

## 2012-10-14 DIAGNOSIS — Z1231 Encounter for screening mammogram for malignant neoplasm of breast: Secondary | ICD-10-CM | POA: Insufficient documentation

## 2012-10-14 DIAGNOSIS — Z139 Encounter for screening, unspecified: Secondary | ICD-10-CM

## 2012-10-15 ENCOUNTER — Other Ambulatory Visit: Payer: Self-pay | Admitting: Family Medicine

## 2012-10-15 DIAGNOSIS — R928 Other abnormal and inconclusive findings on diagnostic imaging of breast: Secondary | ICD-10-CM

## 2012-10-29 ENCOUNTER — Ambulatory Visit (HOSPITAL_COMMUNITY)
Admission: RE | Admit: 2012-10-29 | Discharge: 2012-10-29 | Disposition: A | Discharge status: Home / Self Care | Payer: Medicare Other | Source: Ambulatory Visit | Attending: Family Medicine | Admitting: Family Medicine

## 2012-10-29 ENCOUNTER — Other Ambulatory Visit (HOSPITAL_COMMUNITY): Payer: Self-pay | Admitting: Family Medicine

## 2012-10-29 DIAGNOSIS — R928 Other abnormal and inconclusive findings on diagnostic imaging of breast: Secondary | ICD-10-CM

## 2012-10-29 DIAGNOSIS — N6009 Solitary cyst of unspecified breast: Secondary | ICD-10-CM | POA: Insufficient documentation

## 2013-09-22 ENCOUNTER — Other Ambulatory Visit (HOSPITAL_COMMUNITY): Payer: Self-pay | Admitting: Family Medicine

## 2013-09-22 DIAGNOSIS — Z1231 Encounter for screening mammogram for malignant neoplasm of breast: Secondary | ICD-10-CM

## 2013-10-15 ENCOUNTER — Ambulatory Visit (HOSPITAL_COMMUNITY): Payer: Medicare Other

## 2013-10-30 ENCOUNTER — Ambulatory Visit (HOSPITAL_COMMUNITY)
Admission: RE | Admit: 2013-10-30 | Discharge: 2013-10-30 | Disposition: A | Discharge status: Home / Self Care | Payer: Medicare Other | Source: Ambulatory Visit | Attending: Family Medicine | Admitting: Family Medicine

## 2013-10-30 DIAGNOSIS — R928 Other abnormal and inconclusive findings on diagnostic imaging of breast: Secondary | ICD-10-CM | POA: Insufficient documentation

## 2013-10-30 DIAGNOSIS — Z1231 Encounter for screening mammogram for malignant neoplasm of breast: Secondary | ICD-10-CM | POA: Insufficient documentation

## 2013-11-04 ENCOUNTER — Other Ambulatory Visit: Payer: Self-pay | Admitting: Family Medicine

## 2013-11-04 DIAGNOSIS — R928 Other abnormal and inconclusive findings on diagnostic imaging of breast: Secondary | ICD-10-CM

## 2013-11-13 ENCOUNTER — Ambulatory Visit (INDEPENDENT_AMBULATORY_CARE_PROVIDER_SITE_OTHER): Payer: Medicare Other | Admitting: Cardiology

## 2013-11-13 ENCOUNTER — Encounter: Payer: Self-pay | Admitting: Cardiology

## 2013-11-13 ENCOUNTER — Encounter: Payer: Self-pay | Admitting: *Deleted

## 2013-11-13 VITALS — BP 177/88 | HR 78 | Ht 63.0 in | Wt 125.0 lb

## 2013-11-13 DIAGNOSIS — I359 Nonrheumatic aortic valve disorder, unspecified: Secondary | ICD-10-CM

## 2013-11-13 DIAGNOSIS — I10 Essential (primary) hypertension: Secondary | ICD-10-CM

## 2013-11-13 DIAGNOSIS — R011 Cardiac murmur, unspecified: Secondary | ICD-10-CM

## 2013-11-13 DIAGNOSIS — I471 Supraventricular tachycardia: Secondary | ICD-10-CM

## 2013-11-13 DIAGNOSIS — I350 Nonrheumatic aortic (valve) stenosis: Secondary | ICD-10-CM

## 2013-11-13 DIAGNOSIS — I498 Other specified cardiac arrhythmias: Secondary | ICD-10-CM

## 2013-11-13 NOTE — Progress Notes (Signed)
      HPI: FU dyspnea. History of ablation of AVNRT in 2003. Nuclear study 7/13 showed EF 76 and normal perfusion. Echo 4/13 showed normal LV function and mild to moderate AS (mean gradient 20 mmHg), mild MR. Last seen 4/13. Since then, She has some dyspnea on exertion which is chronic and unchanged. No orthopnea, PND, pedal edema or chest pain or syncope.    Current Outpatient Prescriptions  Medication Sig Dispense Refill  . albuterol (PROVENTIL HFA;VENTOLIN HFA) 108 (90 BASE) MCG/ACT inhaler Inhale 2 puffs into the lungs every 4 (four) hours as needed.      [name] amLODipine (NORVASC) 5 MG tablet Take 5 mg by mouth daily.      . MULTIPLE VITAMIN PO Take by mouth.       No current facility-administered medications for this visit.     Past Medical History  Diagnosis Date  . HTN (hypertension)   . Asthma   . Glaucoma   . Hearing loss   . Prolapsing mitral leaflet syndrome   . SVT (supraventricular tachycardia)     S/P ablation of AVNRT in 2003  . Aortic stenosis     Past Surgical History  Procedure Laterality Date  . Appendectomy    . Bladder surgery    . Cardiac surgery    . Nasal sinus surgery      History   Social History  . Marital Status: Widowed    Spouse Name: N/A    Number of Children: 3  . Years of Education: N/A   Occupational History  . Not on file.   Social History Main Topics  . Smoking status: Never Smoker   . Smokeless tobacco: Not on file  . Alcohol Use: No  . Drug Use: No  . Sexual Activity: Not on file   Other Topics Concern  . Not on file   Social History Narrative  . No narrative on file    ROS: no fevers or chills, productive cough, hemoptysis, dysphasia, odynophagia, melena, hematochezia, dysuria, hematuria, rash, seizure activity, orthopnea, PND, pedal edema, claudication. Remaining systems are negative.  Physical Exam: Well-developed well-nourished in no acute distress.  Skin is warm and dry.  HEENT is normal.  Neck is supple.    Chest is clear to auscultation with normal expansion.  Cardiovascular exam is regular rate and rhythm. 3/6 systolic murmur left sternal border. S2 mildly diminished. Abdominal exam nontender or distended. No masses palpated. Extremities show trace edema. neuro grossly intact  ECG Sinus rhythm at a rate of 78. First degree AV block. Nonspecific ST changes

## 2013-11-13 NOTE — Assessment & Plan Note (Signed)
Status post ablation. 

## 2013-11-13 NOTE — Assessment & Plan Note (Addendum)
Blood pressure elevated today but she follows this at home and typically controlled.. Continue present medications. Follow and adjust as needed.

## 2013-11-13 NOTE — Assessment & Plan Note (Addendum)
Aortic stenosis sounds worse on examination. Plan repeat echocardiogram.

## 2013-11-13 NOTE — Patient Instructions (Signed)

## 2013-11-18 ENCOUNTER — Other Ambulatory Visit: Payer: Self-pay | Admitting: Family Medicine

## 2013-11-18 ENCOUNTER — Ambulatory Visit (HOSPITAL_COMMUNITY)
Admission: RE | Admit: 2013-11-18 | Discharge: 2013-11-18 | Disposition: A | Discharge status: Home / Self Care | Payer: Medicare Other | Source: Ambulatory Visit | Attending: Family Medicine | Admitting: Family Medicine

## 2013-11-18 DIAGNOSIS — N6009 Solitary cyst of unspecified breast: Secondary | ICD-10-CM | POA: Insufficient documentation

## 2013-11-18 DIAGNOSIS — R928 Other abnormal and inconclusive findings on diagnostic imaging of breast: Secondary | ICD-10-CM

## 2013-12-04 ENCOUNTER — Ambulatory Visit (HOSPITAL_COMMUNITY): Payer: Medicare Other | Attending: Cardiovascular Disease | Admitting: Cardiology

## 2013-12-04 DIAGNOSIS — I350 Nonrheumatic aortic (valve) stenosis: Secondary | ICD-10-CM

## 2013-12-04 DIAGNOSIS — I471 Supraventricular tachycardia: Secondary | ICD-10-CM

## 2013-12-04 DIAGNOSIS — R011 Cardiac murmur, unspecified: Secondary | ICD-10-CM

## 2013-12-04 DIAGNOSIS — I359 Nonrheumatic aortic valve disorder, unspecified: Secondary | ICD-10-CM | POA: Insufficient documentation

## 2013-12-04 DIAGNOSIS — I10 Essential (primary) hypertension: Secondary | ICD-10-CM | POA: Insufficient documentation

## 2013-12-04 DIAGNOSIS — I059 Rheumatic mitral valve disease, unspecified: Secondary | ICD-10-CM | POA: Insufficient documentation

## 2013-12-04 NOTE — Progress Notes (Signed)
Echo performed. 

## 2013-12-11 ENCOUNTER — Other Ambulatory Visit (HOSPITAL_COMMUNITY): Payer: Medicare Other

## 2014-05-24 NOTE — Progress Notes (Signed)
      HPI: FU AS. History of ablation of AVNRT in 2003. Nuclear study 7/13 showed EF 76 and normal perfusion. Last echocardiogram June 2015 showed normal LV functionWith moderate to severe aortic stenosis and a mean gradient of 40 mmHg. Mild mitral regurgitation. Since last seen, She has dyspnea on exertion which is chronic and unchanged. No orthopnea, PND, chest pain or syncope. Minimal pedal edema.  Current Outpatient Prescriptions  Medication Sig Dispense Refill  . albuterol (PROVENTIL HFA;VENTOLIN HFA) 108 (90 BASE) MCG/ACT inhaler Inhale 2 puffs into the lungs every 4 (four) hours as needed.    [name] amLODipine (NORVASC) 5 MG tablet Take 5 mg by mouth daily.    [name] BIOTIN FORTE PO Take by mouth daily.    . MULTIPLE VITAMIN PO Take by mouth.     No current facility-administered medications for this visit.     Past Medical History  Diagnosis Date  . HTN (hypertension)   . Asthma   . Glaucoma   . Hearing loss   . Prolapsing mitral leaflet syndrome   . SVT (supraventricular tachycardia)     S/P ablation of AVNRT in 2003  . Aortic stenosis     Past Surgical History  Procedure Laterality Date  . Appendectomy    . Bladder surgery    . Cardiac surgery    . Nasal sinus surgery      History   Social History  . Marital Status: Widowed    Spouse Name: N/A    Number of Children: 3  . Years of Education: N/A   Occupational History  . Not on file.   Social History Main Topics  . Smoking status: Never Smoker   . Smokeless tobacco: Not on file  . Alcohol Use: No  . Drug Use: No  . Sexual Activity: Not on file   Other Topics Concern  . Not on file   Social History Narrative    ROS: Fatigue but no fevers or chills, productive cough, hemoptysis, dysphasia, odynophagia, melena, hematochezia, dysuria, hematuria, rash, seizure activity, orthopnea, PND, claudication. Remaining systems are negative.  Physical Exam: Well-developed well-nourished in no acute distress.  Skin is  warm and dry.  HEENT is normal.  Neck is supple.  Chest is clear to auscultation with normal expansion.  Cardiovascular exam is regular rate and rhythm. 3/6 systolic murmur left sternal border Abdominal exam nontender or distended. No masses palpated. Extremities show Trace edema. neuro grossly intact  ECG Sinus rhythm at a rate of 88. Normal axis. No significant ST changes. Right atrial enlargement.

## 2014-05-25 ENCOUNTER — Ambulatory Visit (INDEPENDENT_AMBULATORY_CARE_PROVIDER_SITE_OTHER): Payer: Medicare Other | Admitting: Cardiology

## 2014-05-25 ENCOUNTER — Encounter: Payer: Self-pay | Admitting: Cardiology

## 2014-05-25 VITALS — BP 160/70 | HR 88 | Ht 63.0 in | Wt 124.1 lb

## 2014-05-25 DIAGNOSIS — I359 Nonrheumatic aortic valve disorder, unspecified: Secondary | ICD-10-CM

## 2014-05-25 DIAGNOSIS — I10 Essential (primary) hypertension: Secondary | ICD-10-CM

## 2014-05-25 DIAGNOSIS — R0602 Shortness of breath: Secondary | ICD-10-CM

## 2014-05-25 DIAGNOSIS — I471 Supraventricular tachycardia: Secondary | ICD-10-CM

## 2014-05-25 NOTE — Patient Instructions (Signed)
Your physician wants you to follow-up in: June [place] will receive a reminder letter in the mail two months in advance. If you don't receive a letter, please call our office to schedule the follow-up appointment.   Your physician has requested that you have an echocardiogram. Echocardiography is a painless test that uses sound waves to create images of your heart. It provides your doctor with information about the size and shape of your heart and how well your heart's chambers and valves are working. This procedure takes approximately one hour. There are no restrictions for this procedure.SCHEDULE IN [place]

## 2014-05-25 NOTE — Assessment & Plan Note (Signed)
Blood pressure is mildly elevated. However she states is typically controlled. Continue present dose of amlodipine. Follow and adjust as needed.

## 2014-05-25 NOTE — Assessment & Plan Note (Signed)
Status post ablation. 

## 2014-05-25 NOTE — Assessment & Plan Note (Signed)
Patient has moderate to severe aortic stenosis. She does have dyspnea on exertion but this is chronic and unchanged. We will plan to repeat her echocardiogram in June 2016. We will see her back after that. I have explained that she will most likely require aortic valve replacement in the future. Given her age we would consider TAVR. I have explained the symptoms of chest pain, syncope and worsening dyspnea that she should be aware of.

## 2014-06-04 ENCOUNTER — Ambulatory Visit (HOSPITAL_COMMUNITY): Payer: Medicare Other

## 2014-07-26 DIAGNOSIS — H4011X2 Primary open-angle glaucoma, moderate stage: Secondary | ICD-10-CM | POA: Diagnosis not present

## 2014-08-30 DIAGNOSIS — I10 Essential (primary) hypertension: Secondary | ICD-10-CM | POA: Diagnosis not present

## 2014-08-30 DIAGNOSIS — J45909 Unspecified asthma, uncomplicated: Secondary | ICD-10-CM | POA: Diagnosis not present

## 2014-08-30 DIAGNOSIS — I350 Nonrheumatic aortic (valve) stenosis: Secondary | ICD-10-CM | POA: Diagnosis not present

## 2014-09-22 ENCOUNTER — Telehealth: Payer: Self-pay | Admitting: Cardiology

## 2014-09-24 NOTE — Telephone Encounter (Signed)
Closed encounter °

## 2014-10-25 ENCOUNTER — Other Ambulatory Visit (HOSPITAL_COMMUNITY): Payer: Self-pay | Admitting: Family Medicine

## 2014-10-25 DIAGNOSIS — Z1231 Encounter for screening mammogram for malignant neoplasm of breast: Secondary | ICD-10-CM

## 2014-11-01 DIAGNOSIS — H4011X2 Primary open-angle glaucoma, moderate stage: Secondary | ICD-10-CM | POA: Diagnosis not present

## 2014-11-08 ENCOUNTER — Ambulatory Visit (HOSPITAL_COMMUNITY)
Admission: RE | Admit: 2014-11-08 | Discharge: 2014-11-08 | Disposition: A | Discharge status: Home / Self Care | Payer: Medicare Other | Source: Ambulatory Visit | Attending: Family Medicine | Admitting: Family Medicine

## 2014-11-08 ENCOUNTER — Ambulatory Visit (HOSPITAL_COMMUNITY): Payer: Medicare Other

## 2014-11-08 DIAGNOSIS — Z1231 Encounter for screening mammogram for malignant neoplasm of breast: Secondary | ICD-10-CM | POA: Diagnosis not present

## 2014-11-12 ENCOUNTER — Ambulatory Visit (HOSPITAL_COMMUNITY): Payer: Medicare Other

## 2014-11-12 ENCOUNTER — Ambulatory Visit (HOSPITAL_COMMUNITY)
Admission: RE | Admit: 2014-11-12 | Discharge: 2014-11-12 | Disposition: A | Discharge status: Home / Self Care | Payer: Medicare Other | Source: Ambulatory Visit | Attending: Cardiology | Admitting: Cardiology

## 2014-11-12 DIAGNOSIS — I340 Nonrheumatic mitral (valve) insufficiency: Secondary | ICD-10-CM | POA: Diagnosis not present

## 2014-11-12 DIAGNOSIS — I071 Rheumatic tricuspid insufficiency: Secondary | ICD-10-CM | POA: Insufficient documentation

## 2014-11-12 DIAGNOSIS — I358 Other nonrheumatic aortic valve disorders: Secondary | ICD-10-CM | POA: Insufficient documentation

## 2014-11-12 DIAGNOSIS — I350 Nonrheumatic aortic (valve) stenosis: Secondary | ICD-10-CM | POA: Insufficient documentation

## 2014-11-12 DIAGNOSIS — R0602 Shortness of breath: Secondary | ICD-10-CM

## 2014-11-12 DIAGNOSIS — I359 Nonrheumatic aortic valve disorder, unspecified: Secondary | ICD-10-CM | POA: Insufficient documentation

## 2014-11-24 DIAGNOSIS — I509 Heart failure, unspecified: Secondary | ICD-10-CM

## 2014-11-24 HISTORY — DX: Heart failure, unspecified: I50.9

## 2014-12-12 ENCOUNTER — Encounter (HOSPITAL_COMMUNITY): Payer: Self-pay | Admitting: Emergency Medicine

## 2014-12-12 ENCOUNTER — Emergency Department (HOSPITAL_COMMUNITY): Payer: Medicare Other

## 2014-12-12 ENCOUNTER — Inpatient Hospital Stay (HOSPITAL_COMMUNITY)
Admission: EM | Admit: 2014-12-12 | Discharge: 2014-12-19 | DRG: 292 | Disposition: A | Discharge status: Home Health | Payer: Medicare Other | Attending: Internal Medicine | Admitting: Internal Medicine

## 2014-12-12 DIAGNOSIS — I4892 Unspecified atrial flutter: Secondary | ICD-10-CM | POA: Diagnosis not present

## 2014-12-12 DIAGNOSIS — I509 Heart failure, unspecified: Secondary | ICD-10-CM

## 2014-12-12 DIAGNOSIS — Z79899 Other long term (current) drug therapy: Secondary | ICD-10-CM | POA: Diagnosis not present

## 2014-12-12 DIAGNOSIS — J80 Acute respiratory distress syndrome: Secondary | ICD-10-CM | POA: Diagnosis present

## 2014-12-12 DIAGNOSIS — E871 Hypo-osmolality and hyponatremia: Secondary | ICD-10-CM | POA: Diagnosis present

## 2014-12-12 DIAGNOSIS — I5031 Acute diastolic (congestive) heart failure: Secondary | ICD-10-CM

## 2014-12-12 DIAGNOSIS — E876 Hypokalemia: Secondary | ICD-10-CM | POA: Diagnosis present

## 2014-12-12 DIAGNOSIS — I4891 Unspecified atrial fibrillation: Secondary | ICD-10-CM | POA: Diagnosis present

## 2014-12-12 DIAGNOSIS — R0603 Acute respiratory distress: Secondary | ICD-10-CM

## 2014-12-12 DIAGNOSIS — I10 Essential (primary) hypertension: Secondary | ICD-10-CM | POA: Diagnosis not present

## 2014-12-12 DIAGNOSIS — H9190 Unspecified hearing loss, unspecified ear: Secondary | ICD-10-CM | POA: Diagnosis present

## 2014-12-12 DIAGNOSIS — J45909 Unspecified asthma, uncomplicated: Secondary | ICD-10-CM | POA: Diagnosis present

## 2014-12-12 DIAGNOSIS — I471 Supraventricular tachycardia: Secondary | ICD-10-CM

## 2014-12-12 DIAGNOSIS — I5033 Acute on chronic diastolic (congestive) heart failure: Secondary | ICD-10-CM | POA: Diagnosis not present

## 2014-12-12 DIAGNOSIS — H409 Unspecified glaucoma: Secondary | ICD-10-CM | POA: Diagnosis present

## 2014-12-12 DIAGNOSIS — Z9889 Other specified postprocedural states: Secondary | ICD-10-CM | POA: Diagnosis present

## 2014-12-12 DIAGNOSIS — E8770 Fluid overload, unspecified: Secondary | ICD-10-CM | POA: Diagnosis not present

## 2014-12-12 DIAGNOSIS — R0602 Shortness of breath: Secondary | ICD-10-CM

## 2014-12-12 DIAGNOSIS — Z8249 Family history of ischemic heart disease and other diseases of the circulatory system: Secondary | ICD-10-CM

## 2014-12-12 DIAGNOSIS — R0682 Tachypnea, not elsewhere classified: Secondary | ICD-10-CM | POA: Diagnosis not present

## 2014-12-12 DIAGNOSIS — I483 Typical atrial flutter: Secondary | ICD-10-CM | POA: Diagnosis not present

## 2014-12-12 DIAGNOSIS — I120 Hypertensive chronic kidney disease with stage 5 chronic kidney disease or end stage renal disease: Secondary | ICD-10-CM | POA: Diagnosis not present

## 2014-12-12 HISTORY — DX: Unspecified atrial fibrillation: I48.91

## 2014-12-12 LAB — BASIC METABOLIC PANEL
Anion gap: 10 (ref 5–15)
BUN: 15 mg/dL (ref 6–20)
CHLORIDE: 98 mmol/L — AB (ref 101–111)
CO2: 24 mmol/L (ref 22–32)
CREATININE: 0.67 mg/dL (ref 0.44–1.00)
Calcium: 8.8 mg/dL — ABNORMAL LOW (ref 8.9–10.3)
GFR calc Af Amer: >60 mL/min (ref >60)
Glucose, Bld: 160 mg/dL — ABNORMAL HIGH (ref 65–99)
Potassium: 3.1 mmol/L — ABNORMAL LOW (ref 3.5–5.1)
SODIUM: 132 mmol/L — AB (ref 135–145)

## 2014-12-12 LAB — CBC WITH DIFFERENTIAL/PLATELET
BASOS PCT: 1 % (ref 0–1)
Basophils Absolute: 0 10*3/uL (ref 0.0–0.1)
EOS PCT: 1 % (ref 0–5)
Eosinophils Absolute: 0.1 10*3/uL (ref 0.0–0.7)
HCT: 36.9 % (ref 36.0–46.0)
Hemoglobin: 12.5 g/dL (ref 12.0–15.0)
Lymphocytes Relative: 15 % (ref 12–46)
Lymphs Abs: 1.2 10*3/uL (ref 0.7–4.0)
MCH: 31.3 pg (ref 26.0–34.0)
MCHC: 33.9 g/dL (ref 30.0–36.0)
MCV: 92.3 fL (ref 78.0–100.0)
MONOS PCT: 8 % (ref 3–12)
Monocytes Absolute: 0.7 10*3/uL (ref 0.1–1.0)
Neutro Abs: 6.2 10*3/uL (ref 1.7–7.7)
Neutrophils Relative %: 75 % (ref 43–77)
Platelets: 196 10*3/uL (ref 150–400)
RBC: 4 MIL/uL (ref 3.87–5.11)
RDW: 12.5 % (ref 11.5–15.5)
WBC: 8.3 10*3/uL (ref 4.0–10.5)

## 2014-12-12 LAB — MRSA PCR SCREENING: MRSA BY PCR: NEGATIVE

## 2014-12-12 LAB — BLOOD GAS, ARTERIAL
Acid-base deficit: 0.6 mmol/L (ref 0.0–2.0)
BICARBONATE: 23.7 meq/L (ref 20.0–24.0)
Drawn by: 422461
FIO2: 1 %
O2 Content: 15 L/min
O2 Saturation: 99 %
PH ART: 7.39 (ref 7.350–7.450)
PO2 ART: 244 mmHg — AB (ref 80.0–100.0)
Patient temperature: 98.6
TCO2: 21.3 mmol/L (ref 0–100)
pCO2 arterial: 40 mmHg (ref 35.0–45.0)

## 2014-12-12 LAB — I-STAT TROPONIN, ED: TROPONIN I, POC: 0.04 ng/mL (ref 0.00–0.08)

## 2014-12-12 LAB — TROPONIN I
TROPONIN I: 0.1 ng/mL — AB (ref <0.031)
Troponin I: 0.11 ng/mL — ABNORMAL HIGH (ref <0.031)
Troponin I: 0.1 ng/mL — ABNORMAL HIGH (ref <0.031)

## 2014-12-12 LAB — LACTIC ACID, PLASMA
Lactic Acid, Venous: 1.2 mmol/L (ref 0.5–2.0)
Lactic Acid, Venous: 3.4 mmol/L (ref 0.5–2.0)

## 2014-12-12 LAB — BRAIN NATRIURETIC PEPTIDE: B NATRIURETIC PEPTIDE 5: 277 pg/mL — AB (ref 0.0–100.0)

## 2014-12-12 MED ORDER — HEPARIN SODIUM (PORCINE) 5000 UNIT/ML IJ SOLN
5000.0000 [IU] | Freq: Three times a day (TID) | INTRAMUSCULAR | Status: DC
  Administered 2014-12-12 – 2014-12-14 (×7): 5000 [IU] via SUBCUTANEOUS
  Filled 2014-12-12 (×8): qty 1

## 2014-12-12 MED ORDER — FUROSEMIDE 10 MG/ML IJ SOLN
40.0000 mg | Freq: Two times a day (BID) | INTRAMUSCULAR | Status: DC
  Administered 2014-12-12 – 2014-12-13 (×3): 40 mg via INTRAVENOUS
  Filled 2014-12-12 (×2): qty 4

## 2014-12-12 MED ORDER — FUROSEMIDE 10 MG/ML IJ SOLN
40.0000 mg | INTRAMUSCULAR | Status: AC
  Administered 2014-12-12: 40 mg via INTRAVENOUS
  Filled 2014-12-12: qty 4

## 2014-12-12 MED ORDER — CETYLPYRIDINIUM CHLORIDE 0.05 % MT LIQD
7.0000 mL | Freq: Two times a day (BID) | OROMUCOSAL | Status: DC
  Administered 2014-12-12 – 2014-12-19 (×10): 7 mL via OROMUCOSAL

## 2014-12-12 MED ORDER — SODIUM CHLORIDE 0.9 % IV SOLN: 250.0000 mL | INTRAVENOUS | Status: DC | PRN

## 2014-12-12 MED ORDER — ALBUTEROL SULFATE (2.5 MG/3ML) 0.083% IN NEBU
5.0000 mg | INHALATION_SOLUTION | Freq: Once | RESPIRATORY_TRACT | Status: AC
  Administered 2014-12-12: 5 mg via RESPIRATORY_TRACT
  Filled 2014-12-12: qty 6

## 2014-12-12 MED ORDER — SODIUM CHLORIDE 0.9 % IJ SOLN: 3.0000 mL | INTRAMUSCULAR | Status: DC | PRN

## 2014-12-12 MED ORDER — SODIUM CHLORIDE 0.9 % IJ SOLN
3.0000 mL | Freq: Two times a day (BID) | INTRAMUSCULAR | Status: DC
  Administered 2014-12-13: 11:00:00 via INTRAVENOUS
  Administered 2014-12-13 – 2014-12-19 (×11): 3 mL via INTRAVENOUS

## 2014-12-12 MED ORDER — ONDANSETRON HCL 4 MG/2ML IJ SOLN: 4.0000 mg | Freq: Four times a day (QID) | INTRAMUSCULAR | Status: DC | PRN

## 2014-12-12 MED ORDER — ALBUTEROL SULFATE (2.5 MG/3ML) 0.083% IN NEBU
2.5000 mg | INHALATION_SOLUTION | RESPIRATORY_TRACT | Status: DC | PRN
Start: 2014-12-12 — End: 2014-12-19

## 2014-12-12 MED ORDER — ACETAMINOPHEN 325 MG PO TABS
650.0000 mg | ORAL_TABLET | ORAL | Status: DC | PRN
  Filled 2014-12-12: qty 2

## 2014-12-12 MED ORDER — AMLODIPINE BESYLATE 5 MG PO TABS
5.0000 mg | ORAL_TABLET | Freq: Every day | ORAL | Status: DC
  Administered 2014-12-12 – 2014-12-15 (×4): 5 mg via ORAL
  Filled 2014-12-12 (×5): qty 1

## 2014-12-12 MED ORDER — IPRATROPIUM-ALBUTEROL 0.5-2.5 (3) MG/3ML IN SOLN
3.0000 mL | Freq: Four times a day (QID) | RESPIRATORY_TRACT | Status: AC
  Administered 2014-12-12 (×4): 3 mL via RESPIRATORY_TRACT
  Filled 2014-12-12 (×4): qty 3

## 2014-12-12 MED ORDER — IPRATROPIUM BROMIDE 0.02 % IN SOLN
0.5000 mg | Freq: Once | RESPIRATORY_TRACT | Status: AC
  Administered 2014-12-12: 0.5 mg via RESPIRATORY_TRACT
  Filled 2014-12-12: qty 2.5

## 2014-12-12 MED ORDER — POTASSIUM CHLORIDE CRYS ER 20 MEQ PO TBCR
40.0000 meq | EXTENDED_RELEASE_TABLET | Freq: Two times a day (BID) | ORAL | Status: AC
  Administered 2014-12-12 (×2): 40 meq via ORAL
  Filled 2014-12-12 (×2): qty 2

## 2014-12-12 MED ORDER — FUROSEMIDE 10 MG/ML IJ SOLN
40.0000 mg | Freq: Once | INTRAMUSCULAR | Status: AC
  Administered 2014-12-13: 40 mg via INTRAVENOUS
  Filled 2014-12-12: qty 4

## 2014-12-12 NOTE — ED Notes (Signed)
Pt incorrectly discharged by this writer

## 2014-12-12 NOTE — ED Notes (Signed)
Pt adjusted in bed per request,  Comfort measures given and family advised to let us know if anything needed or wanted.

## 2014-12-12 NOTE — ED Notes (Signed)
Bed: WA04 Expected date:  Expected time:  Means of arrival:  Comments: EMS Resp Distress

## 2014-12-12 NOTE — Progress Notes (Signed)
Transported Pt to 1224 on BiPap @ Fi02= .40. Pt tolerated well.

## 2014-12-12 NOTE — Progress Notes (Signed)
Utilization review completed.  

## 2014-12-12 NOTE — ED Notes (Signed)
I have just given report to [facility], RN in ICU/step-down and will transport shortly.

## 2014-12-12 NOTE — ED Notes (Signed)
Patient was in respiratory distress that started three days ago with expiratory and inspiratory wheezing. Initial Sats 89% on room air, A-Fib to Sinus Tach to A-Fib. Patient was given albuterol neb 5mg  and 125mg  of Solumedrol. Patient said that inhaler was not working.

## 2014-12-12 NOTE — ED Provider Notes (Signed)
CSN: [id number]     Arrival date & time 12/12/14  0349 History   First MD Initiated Contact with Patient 12/12/14 0353     Chief Complaint  Patient presents with  . Respiratory Distress    Patient was in respiratory distress that started three days ago with expiratory and inspiratory wheezing. Initial Sats 89% on room air, A-Fib to Sinus Tach to A-Fib. Patient was given albuterol neb 5mg  and 125mg  of Solumedrol. Patient said that inhaler was not working.    Level V caveat applies secondary to acuity of condition  (Consider location/radiation/quality/duration/timing/severity/associated sxs/prior Treatment) HPI Comments: 81 year old female with a history of hypertension, SVT status post ablation in 2003, aortic stenosis, and atrial fibrillation presents to the emergency department for further evaluation of respiratory distress. Her EMS, patient called daughter at 3 AM secondary to difficulty breathing. Patient was found to be in respiratory distress on EMS arrival with sats of 89% on room air. Patient has no chronic oxygen requirement. She was given 2 albuterol nebulizer treatments and 125 mg of Solu-Medrol prior to arrival. Patient satting 100% on nonrebreather on arrival to the emergency department. Patient reports some pain/pressure in her left chest. She denies any abdominal pain or known fever. History difficult to obtain from patient as she cannot complete a full sentence secondary to shortness of breath.  Patient is DNI   Past Medical History  Diagnosis Date  . HTN (hypertension)   . Asthma   . Glaucoma   . Hearing loss   . Prolapsing mitral leaflet syndrome   . SVT (supraventricular tachycardia)     S/P ablation of AVNRT in 2003  . Aortic stenosis   . Atrial fibrillation    Past Surgical History  Procedure Laterality Date  . Appendectomy    . Bladder surgery    . Cardiac surgery    . Nasal sinus surgery     Family History  Problem Relation Age of Onset  . Coronary artery  disease Brother     MI at age 51   History  Substance Use Topics  . Smoking status: Never Smoker   . Smokeless tobacco: Not on file  . Alcohol Use: No   OB History    No data available      Review of Systems  Unable to perform ROS: Acuity of condition  Constitutional: Negative for fever.  Respiratory: Positive for shortness of breath.   Cardiovascular: Positive for chest pain.  Neurological: Negative for syncope.    Allergies  Codeine and Hctz  Home Medications   Prior to Admission medications   Medication Sig Start Date End Date Taking? Authorizing Provider  albuterol (PROVENTIL HFA;VENTOLIN HFA) 108 (90 BASE) MCG/ACT inhaler Inhale 2 puffs into the lungs every 4 (four) hours as needed.   Yes Historical Provider, MD  amLODipine (NORVASC) 5 MG tablet Take 5 mg by mouth daily.   Yes Historical Provider, MD  BIOTIN FORTE PO Take by mouth daily.   Yes Historical Provider, MD  MULTIPLE VITAMIN PO Take by mouth.   Yes Historical Provider, MD   BP 131/58 mmHg  Pulse 101  Temp(Src) 98.7 F (37.1 C) (Oral)  Resp 24  SpO2 100%   Physical Exam  Constitutional: She is oriented to person, place, and time. She appears well-developed and well-nourished. She appears distressed.  Patient in mild distress.  HENT:  Head: Normocephalic and atraumatic.  Eyes: Conjunctivae and EOM are normal. No scleral icterus.  Neck: Normal range of motion.  Cardiovascular: Regular  rhythm and intact distal pulses.   Tachycardic rate, regular rhythm  Pulmonary/Chest: She is in respiratory distress. She has wheezes. She has no rales.  Diffuse inspiratory and expiratory wheezes. Patient in respiratory distress. Accessory muscle use noted. Chest expansion is symmetric. Patient speaking in very truncated sentences; no more than 3-5 words secondary to shortness of breath.  Abdominal: Soft. She exhibits no distension. There is no tenderness.  No tenderness to palpation of the abdomen. No peritoneal signs.   Musculoskeletal: Normal range of motion. She exhibits edema.  2+ pitting edema  Neurological: She is alert and oriented to person, place, and time. She exhibits normal muscle tone. Coordination normal.  GCS 15. Patient alert and oriented 3. She moves her extremity is without ataxia.  Skin: Skin is warm and dry. No rash noted. She is not diaphoretic. No erythema. No pallor.  Psychiatric: She has a normal mood and affect. Her behavior is normal.  Nursing note and vitals reviewed.   ED Course  Procedures (including critical care time) Labs Review Labs Reviewed  BASIC METABOLIC PANEL - Abnormal; Notable for the following:    Sodium 132 (*)    Potassium 3.1 (*)    Chloride 98 (*)    Glucose, Bld 160 (*)    Calcium 8.8 (*)    All other components within normal limits  BRAIN NATRIURETIC PEPTIDE - Abnormal; Notable for the following:    B Natriuretic Peptide 277.0 (*)    All other components within normal limits  BLOOD GAS, ARTERIAL - Abnormal; Notable for the following:    pO2, Arterial 244 (*)    All other components within normal limits  CBC WITH DIFFERENTIAL/PLATELET  LACTIC ACID, PLASMA  LACTIC ACID, PLASMA  I-STAT TROPOININ, ED    Imaging Review Dg Chest Port 1 View  12/12/2014   CLINICAL DATA:  Shortness of breath. History of asthma, atrial fibrillation, nonsmoker. Respiratory distress starting 3 days ago with expiratory and inspiratory wheezing. Decreased oxygen saturations.  EXAM: PORTABLE CHEST - 1 VIEW  COMPARISON:  None.  FINDINGS: Mild cardiac enlargement and pulmonary vascular congestion. Diffuse interstitial infiltrates throughout the lungs likely representing edema. No blunting of costophrenic angles. No pneumothorax. No focal consolidation. Calcified aorta.  IMPRESSION: Cardiac enlargement with pulmonary vascular congestion and diffuse interstitial pattern suggesting edema.   Electronically Signed   By: Lucienne Capers M.D.   On: 12/12/2014 04:29     EKG  Interpretation   Date/Time:  Sunday December 12 2014 04:11:41 EDT Ventricular Rate:  100 PR Interval:  206 QRS Duration: 101 QT Interval:  368 QTC Calculation: 475 R Axis:   66 Text Interpretation:  Sinus tachycardia with irregular rate SINCE LAST  TRACING HEART RATE HAS INCREASED Confirmed by Debby Freiberg [phone]) on  12/12/2014 5:23:52 AM       CRITICAL CARE Performed by: Antonietta Breach   Total critical care time: 31  Critical care time was exclusive of separately billable procedures and treating other patients.  Critical care was necessary to treat or prevent imminent or life-threatening deterioration.  Critical care was time spent personally by me on the following activities: development of treatment plan with patient and/or surrogate as well as nursing, discussions with consultants, evaluation of patient's response to treatment, examination of patient, obtaining history from patient or surrogate, ordering and performing treatments and interventions, ordering and review of laboratory studies, ordering and review of radiographic studies, pulse oximetry and re-evaluation of patient's condition.  MDM   Final diagnoses:  Shortness of breath  Acute respiratory distress  Acute congestive heart failure, unspecified congestive heart failure type    81 year old female presents to the emergency department for further evaluation of respiratory distress. Patient with inspiratory and expiratory wheezing on arrival. She was given albuterol nebulizer 2 by EMS with Solu-Medrol. Lung sounds have improved over ED course with BiPAP and Lasix. Chest x-ray shows cardiac enlargement with pulmonary vascular congestion and findings c/w edema. EKG nonischemic with negative initial troponin. Clinical picture suggestive of CHF; no known COPD history. Patient to be admitted to [facility] for further management. She is tolerating BiPAP well with O2 sats of 99%.   Filed Vitals:   12/12/14 0500 12/12/14 0502  12/12/14 0530 12/12/14 0600  BP: 131/58 131/58 120/51 114/43  Pulse: 93 101 88 79  Temp:      TempSrc:      Resp: 19 24 27 19   SpO2: 100% 100% 99% 99%     Antonietta Breach, PA-C 12/12/14 0612  Antonietta Breach, PA-C 12/12/14 [id number]  Debby Freiberg, MD 12/13/14 0025

## 2014-12-12 NOTE — ED Notes (Signed)
Bed: RESB Expected date:  Expected time:  Means of arrival:  Comments: Rm 4

## 2014-12-12 NOTE — ED Notes (Signed)
Respiratory at bedside.

## 2014-12-12 NOTE — H&P (Addendum)
Patient Demographics  Crystal Cooper, is a 81 y.o. female  MRN: [id number]   DOB - 10-20-1926  Admit Date - 12/12/2014  Outpatient Primary MD for the patient is Stephens Shire, MD   With History of -  Past Medical History  Diagnosis Date  . HTN (hypertension)   . Asthma   . Glaucoma   . Hearing loss   . Prolapsing mitral leaflet syndrome   . SVT (supraventricular tachycardia)     S/P ablation of AVNRT in 2003  . Aortic stenosis   . Atrial fibrillation       Past Surgical History  Procedure Laterality Date  . Appendectomy    . Bladder surgery    . Cardiac surgery    . Nasal sinus surgery      in for   Chief Complaint  Patient presents with  . Respiratory Distress    Patient was in respiratory distress that started three days ago with expiratory and inspiratory wheezing. Initial Sats 89% on room air, A-Fib to Sinus Tach to A-Fib. Patient was given albuterol neb 5mg  and 125mg  of Solumedrol. Patient said that inhaler was not working.     HPI  Crystal Cooper  is a 81 y.o. female, with past medical history of diastolic CHF, SVT, asthmatic bronchitis, vertigo stenosis, hypertension, presents with complaints of shortness of breath, patient had recent echo in May 2016 showing grade 2 diastolic dysfunction, EF [id number], agent called her daughter 3 AM secondary to difficulty breathing, found to be in respiratory distress, interacting 89% on room air,  ABG showing no hypoxia or hypercarbia, chest x-ray significant for vascular congestion, has elevated BNP, patient started on BiPAP, received IV Lasix, had significant improvement of her symptoms, reports some is pressure in left chest during shortness of breath, patient is hard of hearing, usually wears hearing aid which she does not have right now, she was obtained from daughter.   Review of Systems    In addition to the HPI above, No Fever-chills, No Headache, No changes with Vision or hearing, No problems swallowing food or  Liquids, Port some Chest pain, Cough and Shortness of Breath, No Abdominal pain, No Nausea or Vommitting, Bowel movements are regular, No Blood in stool or Urine, No dysuria, No new skin rashes or bruises, No new joints pains-aches,  No new weakness, tingling, numbness in any extremity, No recent weight gain or loss, No polyuria, polydypsia or polyphagia, No significant Mental Stressors.  A full 10 point Review of Systems was done, except as stated above, all other Review of Systems were negative.   Social History History  Substance Use Topics  . Smoking status: Never Smoker   . Smokeless tobacco: Not on file  . Alcohol Use: No     Family History Family History  Problem Relation Age of Onset  . Coronary artery disease Brother     MI at age 54     Prior to Admission medications   Medication Sig Start Date End Date Taking? Authorizing Provider  albuterol (PROVENTIL HFA;VENTOLIN HFA) 108 (90 BASE) MCG/ACT inhaler Inhale 2 puffs into the lungs every 4 (four) hours as needed.   Yes Historical Provider, MD  amLODipine (NORVASC) 5 MG tablet Take 5 mg by mouth daily.   Yes Historical Provider, MD  BIOTIN FORTE PO Take by mouth daily.   Yes Historical Provider, MD  MULTIPLE VITAMIN PO Take by mouth.   Yes Historical Provider, MD    Allergies  Allergen Reactions  .  Codeine   . Hctz [Hydrochlorothiazide]     Physical Exam  Vitals  Blood pressure 133/53, pulse 86, temperature 98.7 F (37.1 C), temperature source Oral, resp. rate 20, SpO2 100 %.   1. General frail elderly female lying in bed in NAD,    2. Normal affect and insight, Not Suicidal or Homicidal, Awake Alert, Oriented X 3.  3. No F.N deficits, ALL C.Nerves Intact, Strength 5/5 all 4 extremities, Sensation intact all 4 extremities, Plantars down going.  4. Ears and Eyes appear Normal, Conjunctivae clear, PERRLA. Moist Oral Mucosa.  5. Supple Neck, +8 cm JVD, No cervical lymphadenopathy appriciated, No  Carotid Bruits.  6. Symmetrical Chest wall movement, Good air movement bilaterally, no wheezing  7. RRR, No Gallops, Rubs, significant for murmur 3/ 6, No Parasternal Heave.  8. Positive Bowel Sounds, Abdomen Soft, No tenderness, No organomegaly appriciated,No rebound -guarding or rigidity.  9.  No Cyanosis, Normal Skin Turgor, No Skin Rash or Bruise.  10. Good muscle tone,  joints appear normal , no effusions, Normal ROM.  11. No Palpable Lymph Nodes in Neck or Axillae    Data Review  CBC  Recent Labs Lab 12/12/14 0432  WBC 8.3  HGB 12.5  HCT 36.9  PLT 196  MCV 92.3  MCH 31.3  MCHC 33.9  RDW 12.5  LYMPHSABS 1.2  MONOABS 0.7  EOSABS 0.1  BASOSABS 0.0   ------------------------------------------------------------------------------------------------------------------  Chemistries   Recent Labs Lab 12/12/14 0432  NA 132*  K 3.1*  CL 98*  CO2 24  GLUCOSE 160*  BUN 15  CREATININE 0.67  CALCIUM 8.8*   ------------------------------------------------------------------------------------------------------------------ CrCl cannot be calculated (Unknown ideal weight.). ------------------------------------------------------------------------------------------------------------------ No results for input(s): TSH, T4TOTAL, T3FREE, THYROIDAB in the last 72 hours.  Invalid input(s): FREET3   Coagulation profile No results for input(s): INR, PROTIME in the last 168 hours. ------------------------------------------------------------------------------------------------------------------- No results for input(s): DDIMER in the last 72 hours. -------------------------------------------------------------------------------------------------------------------  Cardiac Enzymes No results for input(s): CKMB, TROPONINI, MYOGLOBIN in the last 168 hours.  Invalid input(s):  CK ------------------------------------------------------------------------------------------------------------------ Invalid input(s): POCBNP   ---------------------------------------------------------------------------------------------------------------  Urinalysis No results found for: COLORURINE, APPEARANCEUR, LABSPEC, PHURINE, GLUCOSEU, HGBUR, BILIRUBINUR, KETONESUR, PROTEINUR, UROBILINOGEN, NITRITE, LEUKOCYTESUR  ----------------------------------------------------------------------------------------------------------------  Imaging results:   Dg Chest Port 1 View  12/12/2014   CLINICAL DATA:  Shortness of breath. History of asthma, atrial fibrillation, nonsmoker. Respiratory distress starting 3 days ago with expiratory and inspiratory wheezing. Decreased oxygen saturations.  EXAM: PORTABLE CHEST - 1 VIEW  COMPARISON:  None.  FINDINGS: Mild cardiac enlargement and pulmonary vascular congestion. Diffuse interstitial infiltrates throughout the lungs likely representing edema. No blunting of costophrenic angles. No pneumothorax. No focal consolidation. Calcified aorta.  IMPRESSION: Cardiac enlargement with pulmonary vascular congestion and diffuse interstitial pattern suggesting edema.   Electronically Signed   By: Lucienne Capers M.D.   On: 12/12/2014 04:29    My personal review of EKG: Sinus arrhythmia, Rate 100 /min, QTc 475 , no Acute ST changes    Assessment & Plan  Active Problems:   Hypertension   SVT (supraventricular tachycardia)   CHF (congestive heart failure)    Acute diastolic CHF - Patient presents with dyspnea, elevated BNP, chest x-ray significant for volume overload, echo done last month showing normal EF, but the grade 2 diastolic dysfunction, will start on IV Lasix 40 mg twice a day, daily weights, strict ins and outs, monitor on telemetry, continue with daily weight. - Asthmatic bronchitis might be contributing factor as patient had wheezing by EMS,  and  endorses some cough, will continue with nebs, when necessary albuterol, hold on IV steroid.  Chest pain - During dyspnea, is likely related to CHF, will monitor on telemetry, and cycle troponins follow the trend.  Hypertension - Continue with Norvasc.  Hypokelmia -will replete, recheck in am.  History of SVT - Monitor on telemetry   DVT Prophylaxis Heparin -    AM Labs Ordered, also please review Full Orders  Family Communication: Admission, patients condition and plan of care including tests being ordered have been discussed with the patient and daughter who indicate understanding and agree with the plan and Code Status.  Code Status full code, this was confirmed with patient and daughter(patient is not DO NOT INTUBATE as initially reported.  Likely DC to  pending further workup  Condition GUARDED   Time spent in minutes : 55 minutes    Micaella Gitto M.D on 12/12/2014 at 8:02 AM  Between 7am to 7pm - Pager - [phone]  After 7pm go to [email or web address] - password [email or web address]  And look for the night coverage person covering me after hours  [facility] Office  [phone]

## 2014-12-13 LAB — BASIC METABOLIC PANEL
ANION GAP: 7 (ref 5–15)
BUN: 22 mg/dL — ABNORMAL HIGH (ref 6–20)
CALCIUM: 8.7 mg/dL — AB (ref 8.9–10.3)
CO2: 28 mmol/L (ref 22–32)
Chloride: 97 mmol/L — ABNORMAL LOW (ref 101–111)
Creatinine, Ser: 0.87 mg/dL (ref 0.44–1.00)
GFR calc Af Amer: >60 mL/min (ref >60)
GFR calc non Af Amer: 59 mL/min — ABNORMAL LOW (ref >60)
GLUCOSE: 115 mg/dL — AB (ref 65–99)
POTASSIUM: 4.5 mmol/L (ref 3.5–5.1)
SODIUM: 132 mmol/L — AB (ref 135–145)

## 2014-12-13 NOTE — Progress Notes (Signed)
Patient Demographics  Crystal Cooper, is a 81 y.o. female, DOB - 04-06-1927, [id number]  Admit date - 12/12/2014   Admitting Physician Ivor Costa, MD  Outpatient Primary MD for the patient is Stephens Shire, MD  LOS - 1   Chief Complaint  Patient presents with  . Respiratory Distress    Patient was in respiratory distress that started three days ago with expiratory and inspiratory wheezing. Initial Sats 89% on room air, A-Fib to Sinus Tach to A-Fib. Patient was given albuterol neb 5mg  and 125mg  of Solumedrol. Patient said that inhaler was not working.       Admission HPI/Brief narrative: 81 y.o. female, with  history of diastolic CHF, SVT, asthmatic bronchitis, aortic stenosis, hypertension, presents withshortness of breath, patient , echo in May 2016 showing grade 2 diastolic dysfunction, EF [id number], initially requiring BiPAP, significant improvement with IV diuresis.  Subjective:   Crystal Cooper today has, No headache, No chest pain, No abdominal pain - No Nausea, No new weakness tingling or numbness, No Cough , reports her shortness of breath much improved.  Assessment & Plan    Active Problems:   Hypertension   SVT (supraventricular tachycardia)   CHF (congestive heart failure)  Acute diastolic CHF - 2-D echo on 5/16 showing EF 55%, with a grade 2 diastolic dysfunction. - Continue with IV Lasix 40 mg twice a day, strict ins and outs, -2270 mL over last 24 hours, monitor daily weight. - No need of BiPAP overnight, more stable, transfer to telemetry.  Chest pain - During dyspnea, is likely related to CHF, continue to monitor on telemetry, no recurrence. - Troponin has plateaued 0.11>> 0.10>> 0.10 - Discussed with cardiology 6/19 Dr. Caryl Comes, patient with normal stress test 12/2011, this is most likely related to demand ischemia, not a candidate for any further testing.  Hypertension - Continue  with Norvasc.  Hypokelmia - replaced  History of SVT - Monitor on telemetry  Code Status: FULL  Family Communication: Discussed with patient, none at bedside  Disposition Plan: Transfer to telemetry, pending PT evaluation   Procedures None   Consults   None   Medications  Scheduled Meds: . amLODipine  5 mg Oral Daily  . antiseptic oral rinse  7 mL Mouth Rinse BID  . furosemide  40 mg Intravenous BID  . furosemide  40 mg Intravenous Once  . heparin  5,000 Units Subcutaneous 3 times per day  . sodium chloride  3 mL Intravenous Q12H   Continuous Infusions:  PRN Meds:.sodium chloride, acetaminophen, albuterol, ondansetron (ZOFRAN) IV, sodium chloride  DVT Prophylaxis  Heparin -  Lab Results  Component Value Date   PLT 196 12/12/2014    Antibiotics    Anti-infectives    None          Objective:   Filed Vitals:   12/13/14 0500 12/13/14 0600 12/13/14 0700 12/13/14 0800  BP: 157/62 146/52 150/63   Pulse: 62 63 70 66  Temp:    98.4 F (36.9 C)  TempSrc:    Axillary  Resp: 18 19 20 24   Height:      Weight: 58.1 kg (128 lb 1.4 oz)     SpO2: 99% 100% 98% 98%    Wt Readings from Last 3  Encounters:  12/13/14 58.1 kg (128 lb 1.4 oz)  05/25/14 56.291 kg (124 lb 1.6 oz)  11/13/13 56.7 kg (125 lb)     Intake/Output Summary (Last 24 hours) at 12/13/14 1033 Last data filed at 12/13/14 0900  Gross per 24 hour  Intake     80 ml  Output   2150 ml  Net  -2070 ml     Physical Exam  Awake Alert, Oriented X 3, No new F.N deficits, Normal affect [place].AT,PERRAL Supple Neck,+ JVD, No cervical lymphadenopathy appriciated.  Symmetrical Chest wall movement, Good air movement bilaterally, bibasilar crackles RRR,No Gallops,Rubs, 3/6 [place], No Parasternal Heave +ve B.Sounds, Abd Soft, No tenderness, No organomegaly appriciated, No rebound - guarding or rigidity. No Cyanosis, Clubbing or edema, No new Rash or bruise     Data Review   Micro Results Recent  Results (from the past 240 hour(s))  MRSA PCR Screening     Status: None   Collection Time: 12/12/14 10:00 AM  Result Value Ref Range Status   MRSA by PCR NEGATIVE NEGATIVE Final    Comment:        The GeneXpert MRSA Assay (FDA approved for NASAL specimens only), is one component of a comprehensive MRSA colonization surveillance program. It is not intended to diagnose MRSA infection nor to guide or monitor treatment for MRSA infections.     Radiology Reports Dg Chest Port 1 View  12/12/2014   CLINICAL DATA:  Shortness of breath. History of asthma, atrial fibrillation, nonsmoker. Respiratory distress starting 3 days ago with expiratory and inspiratory wheezing. Decreased oxygen saturations.  EXAM: PORTABLE CHEST - 1 VIEW  COMPARISON:  None.  FINDINGS: Mild cardiac enlargement and pulmonary vascular congestion. Diffuse interstitial infiltrates throughout the lungs likely representing edema. No blunting of costophrenic angles. No pneumothorax. No focal consolidation. Calcified aorta.  IMPRESSION: Cardiac enlargement with pulmonary vascular congestion and diffuse interstitial pattern suggesting edema.   Electronically Signed   By: Lucienne Capers M.D.   On: 12/12/2014 04:29     CBC  Recent Labs Lab 12/12/14 0432  WBC 8.3  HGB 12.5  HCT 36.9  PLT 196  MCV 92.3  MCH 31.3  MCHC 33.9  RDW 12.5  LYMPHSABS 1.2  MONOABS 0.7  EOSABS 0.1  BASOSABS 0.0    Chemistries   Recent Labs Lab 12/12/14 0432 12/13/14 0357  NA 132* 132*  K 3.1* 4.5  CL 98* 97*  CO2 24 28  GLUCOSE 160* 115*  BUN 15 22*  CREATININE 0.67 0.87  CALCIUM 8.8* 8.7*   ------------------------------------------------------------------------------------------------------------------ estimated creatinine clearance is 39.1 mL/min (by C-G formula based on Cr of 0.87). ------------------------------------------------------------------------------------------------------------------ No results for input(s):  HGBA1C in the last 72 hours. ------------------------------------------------------------------------------------------------------------------ No results for input(s): CHOL, HDL, LDLCALC, TRIG, CHOLHDL, LDLDIRECT in the last 72 hours. ------------------------------------------------------------------------------------------------------------------ No results for input(s): TSH, T4TOTAL, T3FREE, THYROIDAB in the last 72 hours.  Invalid input(s): FREET3 ------------------------------------------------------------------------------------------------------------------ No results for input(s): VITAMINB12, FOLATE, FERRITIN, TIBC, IRON, RETICCTPCT in the last 72 hours.  Coagulation profile No results for input(s): INR, PROTIME in the last 168 hours.  No results for input(s): DDIMER in the last 72 hours.  Cardiac Enzymes  Recent Labs Lab 12/12/14 1014 12/12/14 1540 12/12/14 2200  TROPONINI 0.11* 0.10* 0.10*   ------------------------------------------------------------------------------------------------------------------ Invalid input(s): POCBNP     Time Spent in minutes   30 minutes   Yazir Koerber M.D on 12/13/2014 at 10:33 AM  Between 7am to 7pm - Pager - [phone]  After 7pm go to [email or web address] -  password [facility]   Office  [phone]

## 2014-12-13 NOTE — Care Management Note (Signed)
Case Management Note  Patient Details  Name: Crystal Cooper MRN: [id number] Date of Birth: [birth year]  Subjective/Objective:   Transfer from SDU.CHF.From home.PT cons placed. Await recommendations.                 Action/Plan:Monitor progress for d/c plans.   Expected Discharge Date:                Expected Discharge Plan:  Home/Self Care  In-House Referral:     Discharge planning Services  CM Consult  Post Acute Care Choice:    Choice offered to:     DME Arranged:    DME Agency:     HH Arranged:    HH Agency:     Status of Service:  In process, will continue to follow  Medicare Important Message Given:    Date Medicare IM Given:    Medicare IM give by:    Date Additional Medicare IM Given:    Additional Medicare Important Message give by:     If discussed at [place] of Stay Meetings, dates discussed:    Additional Comments:  Dessa Phi, RN 12/13/2014, 12:50 PM

## 2014-12-13 NOTE — Progress Notes (Signed)
Patient converted to a.fib/flutter. Denies symptoms, BP stable, MD notified Barbee Shro[place]. Brigitte Pulse, RN

## 2014-12-14 DIAGNOSIS — I5033 Acute on chronic diastolic (congestive) heart failure: Principal | ICD-10-CM

## 2014-12-14 DIAGNOSIS — I4892 Unspecified atrial flutter: Secondary | ICD-10-CM

## 2014-12-14 DIAGNOSIS — I483 Typical atrial flutter: Secondary | ICD-10-CM

## 2014-12-14 LAB — BASIC METABOLIC PANEL
Anion gap: 8 (ref 5–15)
BUN: 20 mg/dL (ref 6–20)
CO2: 29 mmol/L (ref 22–32)
CREATININE: 0.76 mg/dL (ref 0.44–1.00)
Calcium: 8.5 mg/dL — ABNORMAL LOW (ref 8.9–10.3)
Chloride: 90 mmol/L — ABNORMAL LOW (ref 101–111)
GFR calc Af Amer: >60 mL/min (ref >60)
GLUCOSE: 108 mg/dL — AB (ref 65–99)
Potassium: 3.4 mmol/L — ABNORMAL LOW (ref 3.5–5.1)
Sodium: 127 mmol/L — ABNORMAL LOW (ref 135–145)

## 2014-12-14 LAB — MAGNESIUM: MAGNESIUM: 1.9 mg/dL (ref 1.7–2.4)

## 2014-12-14 LAB — SODIUM, URINE, RANDOM: SODIUM UR: 53 mmol/L

## 2014-12-14 LAB — OSMOLALITY: Osmolality: 269 mOsm/kg — ABNORMAL LOW (ref 275–300)

## 2014-12-14 MED ORDER — FUROSEMIDE 40 MG PO TABS
40.0000 mg | ORAL_TABLET | Freq: Every day | ORAL | Status: DC
  Filled 2014-12-14: qty 1

## 2014-12-14 MED ORDER — FUROSEMIDE 40 MG PO TABS
40.0000 mg | ORAL_TABLET | Freq: Every day | ORAL | Status: DC
  Administered 2014-12-14 – 2014-12-15 (×2): 40 mg via ORAL
  Filled 2014-12-14: qty 1

## 2014-12-14 MED ORDER — APIXABAN 2.5 MG PO TABS
2.5000 mg | ORAL_TABLET | Freq: Two times a day (BID) | ORAL | Status: DC
  Administered 2014-12-14 – 2014-12-19 (×11): 2.5 mg via ORAL
  Filled 2014-12-14 (×11): qty 1

## 2014-12-14 MED ORDER — POTASSIUM CHLORIDE CRYS ER 20 MEQ PO TBCR
30.0000 meq | EXTENDED_RELEASE_TABLET | ORAL | Status: AC
  Administered 2014-12-14 (×2): 30 meq via ORAL
  Filled 2014-12-14 (×4): qty 1

## 2014-12-14 NOTE — Progress Notes (Addendum)
Patient Demographics  Crystal Cooper, is a 81 y.o. female, DOB - 30-Sep-1926, [id number]  Admit date - 12/12/2014   Admitting Physician Ivor Costa, MD  Outpatient Primary MD for the patient is Stephens Shire, MD  LOS - 2   Chief Complaint  Patient presents with  . Respiratory Distress    Patient was in respiratory distress that started three days ago with expiratory and inspiratory wheezing. Initial Sats 89% on room air, A-Fib to Sinus Tach to A-Fib. Patient was given albuterol neb 5mg  and 125mg  of Solumedrol. Patient said that inhaler was not working.       Admission HPI/Brief narrative: 81 y.o. female, with  history of diastolic CHF, SVT, asthmatic bronchitis, aortic stenosis, hypertension, presents withshortness of breath, patient , echo in May 2016 showing grade 2 diastolic dysfunction, EF [id number], initially requiring BiPAP, significant improvement with IV diuresis. Patient developed a flutter on 12/13/14  Subjective:   Crystal Cooper today has, No headache, No chest pain, No abdominal pain - No Nausea, No new weakness tingling or numbness, No Cough , denies any further shortness of breath.  Assessment & Plan    Active Problems:   Hypertension   SVT (supraventricular tachycardia)   CHF (congestive heart failure)  Acute diastolic CHF - 2-D echo on 5/16 showing EF 55%, with a grade 2 diastolic dysfunction. - Initially on IV Lasix 40 mg twice a day, strict ins and outs, -4930 L since admission,monitor daily weight. Weight went from 57 on admission to 54 kg today, will change IV diuresis to Lasix 40 mg oral daily(as patient appears to be over diuresed especially in the setting of hyponatremia) - No need of BiPAP overnight, more stable, transfer to telemetry.  Chest pain - During dyspnea, is likely related to CHF, continue to monitor on telemetry, no recurrence. - Troponin has plateaued 0.11>> 0.10>>  0.10 - Discussed with cardiology,  normal stress test 12/2011, this is most likely related to demand ischemia, not a candidate for any further testing.  Atrial flutter - Cardiology consult appreciated,CHA2DS2 VASc score of 5 , rate controlled, radiology recommended anticoagulation.  Hyponatremia - Most likely in the setting of diuresis, will DC IV diuresis, lower Lasix dose, check serum osmolality, urine osmolality and sodium.  Hypertension - Continue with Norvasc.  Hypokelmia - Repleted, recheck in a.m. magnesium within normal limits  History of SVT - Monitor on telemetry  Code Status: FULL  Family Communication: Discussed with patient, none at bedside  Disposition Plan: Continue with telemetry   Procedures None   Consults   None   Medications  Scheduled Meds: . amLODipine  5 mg Oral Daily  . antiseptic oral rinse  7 mL Mouth Rinse BID  . furosemide  40 mg Oral Daily  . heparin  5,000 Units Subcutaneous 3 times per day  . potassium chloride  30 mEq Oral Q4H  . sodium chloride  3 mL Intravenous Q12H   Continuous Infusions:  PRN Meds:.sodium chloride, acetaminophen, albuterol, ondansetron (ZOFRAN) IV, sodium chloride  DVT Prophylaxis  Heparin -  Lab Results  Component Value Date   PLT 196 12/12/2014    Antibiotics    Anti-infectives    None          Objective:  Filed Vitals:   12/13/14 1232 12/13/14 1816 12/13/14 2031 12/14/14 0513  BP: 155/73 132/78 115/64 133/58  Pulse: 86 88 67 71  Temp: 98.1 F (36.7 C)  98.2 F (36.8 C) 97.5 F (36.4 C)  TempSrc: Oral  Oral Oral  Resp: 20  20 20   Height:      Weight:    54.6 kg (120 lb 5.9 oz)  SpO2: 94%  97% 93%    Wt Readings from Last 3 Encounters:  12/14/14 54.6 kg (120 lb 5.9 oz)  05/25/14 56.291 kg (124 lb 1.6 oz)  11/13/13 56.7 kg (125 lb)     Intake/Output Summary (Last 24 hours) at 12/14/14 1115 Last data filed at 12/14/14 0555  Gross per 24 hour  Intake    120 ml  Output   2800  ml  Net  -2680 ml     Physical Exam  Awake Alert, Oriented X 3, No new F.N deficits, Normal affect [place].AT,PERRAL Supple Neck,+ JVD, No cervical lymphadenopathy appriciated.  Symmetrical Chest wall movement, Good air movement bilaterally, Irregular,No Gallops,Rubs, 3/6 Murmur,NO parasternal heave  +ve B.Sounds, Abd Soft, No tenderness, No organomegaly appriciated, No rebound - guarding or rigidity. No Cyanosis, Clubbing or edema, No new Rash or bruise     Data Review   Micro Results Recent Results (from the past 240 hour(s))  MRSA PCR Screening     Status: None   Collection Time: 12/12/14 10:00 AM  Result Value Ref Range Status   MRSA by PCR NEGATIVE NEGATIVE Final    Comment:        The GeneXpert MRSA Assay (FDA approved for NASAL specimens only), is one component of a comprehensive MRSA colonization surveillance program. It is not intended to diagnose MRSA infection nor to guide or monitor treatment for MRSA infections.     Radiology Reports Dg Chest Port 1 View  12/12/2014   CLINICAL DATA:  Shortness of breath. History of asthma, atrial fibrillation, nonsmoker. Respiratory distress starting 3 days ago with expiratory and inspiratory wheezing. Decreased oxygen saturations.  EXAM: PORTABLE CHEST - 1 VIEW  COMPARISON:  None.  FINDINGS: Mild cardiac enlargement and pulmonary vascular congestion. Diffuse interstitial infiltrates throughout the lungs likely representing edema. No blunting of costophrenic angles. No pneumothorax. No focal consolidation. Calcified aorta.  IMPRESSION: Cardiac enlargement with pulmonary vascular congestion and diffuse interstitial pattern suggesting edema.   Electronically Signed   By: Lucienne Capers M.D.   On: 12/12/2014 04:29     CBC  Recent Labs Lab 12/12/14 0432  WBC 8.3  HGB 12.5  HCT 36.9  PLT 196  MCV 92.3  MCH 31.3  MCHC 33.9  RDW 12.5  LYMPHSABS 1.2  MONOABS 0.7  EOSABS 0.1  BASOSABS 0.0    Chemistries   Recent  Labs Lab 12/12/14 0432 12/13/14 0357 12/14/14 0410 12/14/14 0900  NA 132* 132* 127*  --   K 3.1* 4.5 3.4*  --   CL 98* 97* 90*  --   CO2 24 28 29   --   GLUCOSE 160* 115* 108*  --   BUN 15 22* 20  --   CREATININE 0.67 0.87 0.76  --   CALCIUM 8.8* 8.7* 8.5*  --   MG  --   --   --  1.9   ------------------------------------------------------------------------------------------------------------------ estimated creatinine clearance is 42.5 mL/min (by C-G formula based on Cr of 0.76). ------------------------------------------------------------------------------------------------------------------ No results for input(s): HGBA1C in the last 72 hours. ------------------------------------------------------------------------------------------------------------------ No results for input(s): CHOL, HDL, LDLCALC, TRIG,  CHOLHDL, LDLDIRECT in the last 72 hours. ------------------------------------------------------------------------------------------------------------------ No results for input(s): TSH, T4TOTAL, T3FREE, THYROIDAB in the last 72 hours.  Invalid input(s): FREET3 ------------------------------------------------------------------------------------------------------------------ No results for input(s): VITAMINB12, FOLATE, FERRITIN, TIBC, IRON, RETICCTPCT in the last 72 hours.  Coagulation profile No results for input(s): INR, PROTIME in the last 168 hours.  No results for input(s): DDIMER in the last 72 hours.  Cardiac Enzymes  Recent Labs Lab 12/12/14 1014 12/12/14 1540 12/12/14 2200  TROPONINI 0.11* 0.10* 0.10*   ------------------------------------------------------------------------------------------------------------------ Invalid input(s): POCBNP     Time Spent in minutes   30 minutes   Chucky Homes M.D on 12/14/2014 at 11:15 AM  Between 7am to 7pm - Pager - [phone]  After 7pm go to [email or web address] - password [facility]  [facility]   Office   [phone]

## 2014-12-14 NOTE — Consult Note (Signed)
Patient ID: Crystal Cooper MRN: [id number], DOB/AGE: 1927-01-25   Admit date: 12/12/2014   Primary Physician: Stephens Shire, MD Primary Cardiologist: Dr. Stanford Breed  Pt. Profile:  81 y/o female with chronic diastolic CHF, aortic stenosis and SVT s/p ablation in 2003, admitted for a/c CHF. Also found to be in atrial flutter.    Problem List  Past Medical History  Diagnosis Date  . HTN (hypertension)   . Asthma   . Glaucoma   . Hearing loss   . Prolapsing mitral leaflet syndrome   . SVT (supraventricular tachycardia)     S/P ablation of AVNRT in 2003  . Aortic stenosis   . Atrial fibrillation     Past Surgical History  Procedure Laterality Date  . Appendectomy    . Bladder surgery    . Cardiac surgery    . Nasal sinus surgery       Allergies  Allergies  Allergen Reactions  . Codeine   . Hctz [Hydrochlorothiazide]     HPI  The patient is a 81 y/o female, followed by Dr. Stanford Breed, with a h/o SVT s/p AVNRT ablation in 2003, aortic stenosis, atrial fibrillation, HTN and chronic diastolic CHF with grade 2 DD on recent echo 10/2014. EF is normal at 60-65%. Her AS is moderate-severe. Recent 2 echo demonstrated an AVA of 0.73 cm2 and mean gradient of 31 mg Hg.  She was admitted to [facility] by IM, 12/12/14, for acute on chronic diastolic CHF. She has had a good response to IV diuretic therapy and dyspnea has improved. I/Os negative 4.9 L. She is well below her dry office weight of 124 lb. Hospital weight is at 120 lb. Cardiology has been consulted regarding abnormal heart rhythm on telemetry, which appears to be atrial flutter with a controlled and, at times, a slow ventricular response. Ventricular rate is currently in the 90s. She is hypokalemic with a K of 3.4. Mg normal at 1.9. Troponin's are mildly elevated with flat trend (0.11, 0.10, 0.10), likely secondary to demand ischemia.  CBC normal on admit.  She has remained afebrile. CXR on admit only with pulmonary vascular  congestion/ edema. Renal function is normal. CHA2DS2 VASc score is 5. (CHF, HTN, Age> 17 (2), Female Sex).  She reports that she feels better. Her dyspnea and LEE have resolved. She denies any palpitations. No chest pain, dizziness, syncope/ near syncope. No h/o falls or GIB.    Home Medications  Prior to Admission medications   Medication Sig Start Date End Date Taking? Authorizing Provider  albuterol (PROVENTIL HFA;VENTOLIN HFA) 108 (90 BASE) MCG/ACT inhaler Inhale 2 puffs into the lungs every 4 (four) hours as needed.   Yes Historical Provider, MD  amLODipine (NORVASC) 5 MG tablet Take 5 mg by mouth daily.   Yes Historical Provider, MD  BIOTIN FORTE PO Take by mouth daily.   Yes Historical Provider, MD  MULTIPLE VITAMIN PO Take by mouth.   Yes Historical Provider, MD    Family History  Family History  Problem Relation Age of Onset  . Coronary artery disease Brother     MI at age 14    Social History  History   Social History  . Marital Status: Widowed    Spouse Name: N/A  . Number of Children: 3  . Years of Education: N/A   Occupational History  . Not on file.   Social History Main Topics  . Smoking status: Never Smoker   . Smokeless tobacco: Not on file  .  Alcohol Use: No  . Drug Use: No  . Sexual Activity: Not on file   Other Topics Concern  . Not on file   Social History Narrative     Review of Systems General:  No chills, fever, night sweats or weight changes.  Cardiovascular:  No chest pain, dyspnea on exertion, edema, orthopnea, palpitations, paroxysmal nocturnal dyspnea. Dermatological: No rash, lesions/masses Respiratory: No cough, dyspnea Urologic: No hematuria, dysuria Abdominal:   No nausea, vomiting, diarrhea, bright red blood per rectum, melena, or hematemesis Neurologic:  No visual changes, wkns, changes in mental status. All other systems reviewed and are otherwise negative except as noted above.  Physical Exam  Blood pressure 133/58,  pulse 71, temperature 97.5 F (36.4 C), temperature source Oral, resp. rate 20, height 5\' 3"  (1.6 m), weight 120 lb 5.9 oz (54.6 kg), SpO2 93 %.  General: Pleasant, NAD Psych: Normal affect. Neuro: Alert and oriented X 3. Moves all extremities spontaneously. HEENT: Normal  Neck: Supple without bruits or JVD. Lungs:  Resp regular and unlabored, CTA. Heart: RRR 3/6 SM heard throughout the precordium  Abdomen: Soft, non-tender, non-distended, BS + x 4.  Extremities: No clubbing, cyanosis or edema. DP/PT/Radials 2+ and equal bilaterally.  Labs  Troponin [facility] of Care Test)  Recent Labs  12/12/14 0440  TROPIPOC 0.04    Recent Labs  12/12/14 1014 12/12/14 1540 12/12/14 2200  TROPONINI 0.11* 0.10* 0.10*   Lab Results  Component Value Date   WBC 8.3 12/12/2014   HGB 12.5 12/12/2014   HCT 36.9 12/12/2014   MCV 92.3 12/12/2014   PLT 196 12/12/2014    Recent Labs Lab 12/14/14 0410  NA 127*  K 3.4*  CL 90*  CO2 29  BUN 20  CREATININE 0.76  CALCIUM 8.5*  GLUCOSE 108*   No results found for: CHOL, HDL, LDLCALC, TRIG No results found for: DDIMER   Radiology/Studies  Dg Chest Port 1 View  12/12/2014   CLINICAL DATA:  Shortness of breath. History of asthma, atrial fibrillation, nonsmoker. Respiratory distress starting 3 days ago with expiratory and inspiratory wheezing. Decreased oxygen saturations.  EXAM: PORTABLE CHEST - 1 VIEW  COMPARISON:  None.  FINDINGS: Mild cardiac enlargement and pulmonary vascular congestion. Diffuse interstitial infiltrates throughout the lungs likely representing edema. No blunting of costophrenic angles. No pneumothorax. No focal consolidation. Calcified aorta.  IMPRESSION: Cardiac enlargement with pulmonary vascular congestion and diffuse interstitial pattern suggesting edema.   Electronically Signed   By: Lucienne Capers M.D.   On: 12/12/2014 04:29    ECG  Atrial flutter with a SVR.    ASSESSMENT AND PLAN  Active Problems:    Hypertension   SVT (supraventricular tachycardia)   CHF (congestive heart failure)    1. Atrial Flutter: telemetry strips demonstrate atrial flutter with classic saw tooth pattern. Ventricular rate is well controlled currently in the 90s. She has had some occasional bradycardia on telemetry as well with rates as low as 39. Would avoid use of AVN blocking agents to prevent recurrent bradyacardia. She has been asymptomatic w/o dizziness, syncope/ near syncope. She has and elevated CHA2DS2 VASc score of 5 (CHF, HTN, Age >61 and female sex), placing her at a high risk for stroke/ TIA. She has good cognitive function and denies h/o falls or GIB. Would recommend anticoagulation. She has normal renal function. Would recommend Eliquis. Given age > 71 and weight < than 60 kg, she will need the lower dose, 2.5 mg BID. Check TSH. May also consider OP  cardiac monitor to assess for recurrent bradycardia. Will defer to MD.   2. Aortic Stenosis: Moderate-severe based on recent echo. Asymptomatic (no dyspnea after diuresis, no chest pain or syncope). Dr. Stanford Breed will continue to follow.  3. Chronic Diastolic CHF: euvolemic. Diuresed well. Symptoms resolved. Below dry weight of 124 (120 lb today). Continue PO Lasix and low sodium diet.   4. Elevated Troponin: mildly elevated with flat trend. No chest pain. Likely demand ischemia from CHF.    Signed, SIMMONS, Silas Flood, PA-C 12/14/2014, 10:07 AM   History and all data above reviewed.  Patient examined.  I agree with the findings as above. The patient has had increased dyspnea over a week or so.  She has not noticed any irregular rhythm.  She has had some orthopnea.   The patient exam reveals [id number], 3/6 systolic murmur radiating out the outflow tract, no diastolic murmur  ,  Lungs: Clear  ,  Abd: Positive bowel sounds, no rebound no guarding, Ext No edema  .  All available labs, radiology testing, previous records reviewed. Agree with documented assessment  and plan.   Atrial flutter:  Rate is controlled.  Discussed at length with the patient and one of her daughters.  She will need anticoagulation and she will be started on Eliquis.  No contraindication to this.  She was in NSR earlier this admission.  No indication for cardioversion.  She can have follow up with Dr. Stanford Breed after discharge and out patient monitoring to determine further med adjustments for rate or rhythm control.   Jeneen Rinks [facility]  11:33 AM  12/14/2014

## 2014-12-14 NOTE — Care Management Note (Signed)
Case Management Note  Patient Details  Name: TAJ DILS MRN: [id number] Date of Birth: 06-22-1927  Subjective/Objective: Patient chose [facility] for HHRN/PT/OT.TC [facility] rep Kristen aware of orders, await d/c order.                   Action/Plan:d/c plan home w/HHC.   Expected Discharge Date:                Expected Discharge Plan:  [place]  In-House Referral:     Discharge planning Services  CM Consult  Post Acute Care Choice:    Choice offered to:     DME Arranged:    DME Agency:     HH Arranged:  RN, PT, OT [place] Agency:  [place]  Status of Service:  In process, will continue to follow  Medicare Important Message Given:    Date Medicare IM Given:    Medicare IM give by:    Date Additional Medicare IM Given:    Additional Medicare Important Message give by:     If discussed at [place] of Stay Meetings, dates discussed:    Additional Comments:  Dessa Phi, RN 12/14/2014, 3:58 PM

## 2014-12-14 NOTE — Evaluation (Signed)
Physical Therapy Evaluation Patient Details Name: Crystal Cooper MRN: [id number] DOB: 1926/12/29 Today's Date: 12/14/2014   History of Present Illness  81 y/o female with chronic diastolic CHF, aortic stenosis and SVT s/p ablation in 2003, admitted for a/c CHF. Also found to be in atrial flutter.   Clinical Impression  Pt admitted with above diagnosis. Pt currently with functional limitations due to the deficits listed below (see PT Problem List). Pt will need HH therapies and to use RW at all times.  Pt and daughter aware to use RW for safety upon d/c and HH can make any safety recommendations.  Will follow acutely.  Pt will benefit from skilled PT to increase their independence and safety with mobility to allow discharge to the venue listed below.     Follow Up Recommendations Home health PT;Supervision - Intermittent (HHOT and HHRN if needed)    Equipment Recommendations  None recommended by PT    Recommendations for Other Services       Precautions / Restrictions Precautions Precautions: Fall Restrictions Weight Bearing Restrictions: No      Mobility  Bed Mobility Overal bed mobility: Independent                Transfers Overall transfer level: Independent                  Ambulation/Gait Ambulation/Gait assistance: Min guard Ambulation Distance (Feet): 250 Feet Assistive device: None;Rolling walker (2 wheeled) Gait Pattern/deviations: Step-through pattern;Decreased stride length;Shuffle;Staggering left;Staggering right;Narrow base of support   Gait velocity interpretation: <1.8 ft/sec, indicative of risk for recurrent falls General Gait Details: Ambulated some without RW and some with RW.  Pt steadier with RW.  Pt has a RW and encouraged pt to use at home.  Without RW, pt staggers occasionally.  Daughter states she will try to get pt to use RW at home.  Daughter feels that her gait without Rw is her baseline and that she shuffles her feet almost always.  Pt  has slow and guarded gait.    Stairs            Wheelchair Mobility    Modified Rankin (Stroke Patients Only)       Balance Overall balance assessment: Needs assistance Sitting-balance support: No upper extremity supported;Feet supported Sitting balance-Leahy Scale: Good     Standing balance support: No upper extremity supported;During functional activity Standing balance-Leahy Scale: Fair Standing balance comment: can stand statically without RW for short periods of time.  Able to wash hands at sink without PT assist/support.              High level balance activites: Turns High Level Balance Comments: min guard assist with turns.              Pertinent Vitals/Pain Pain Assessment: No/denies pain  VSS    Home Living Family/patient expects to be discharged to:: Private residence Living Arrangements: Alone Available Help at Discharge: Family;Available PRN/intermittently Type of Home: House Home Access: Stairs to enter Entrance Stairs-Rails: Left Entrance Stairs-Number of Steps: 3 Home Layout: One level Home Equipment: Walker - 2 wheels      Prior Function Level of Independence: Independent         Comments: daughter came in and observed part of treatment and agrees pt is close to baseline.  she furniture walks and shuffled her feet PTA per daughter     Hand Dominance   Dominant Hand: Right    Extremity/Trunk Assessment   Upper Extremity Assessment:  Defer to OT evaluation           Lower Extremity Assessment: Generalized weakness      Cervical / Trunk Assessment: Kyphotic  Communication   Communication: No difficulties  Cognition Arousal/Alertness: Awake/alert Behavior During Therapy: WFL for tasks assessed/performed Overall Cognitive Status: Within Functional Limits for tasks assessed       Memory: Decreased short-term memory              General Comments      Exercises        Assessment/Plan    PT Assessment  Patient needs continued PT services  PT Diagnosis Generalized weakness   PT Problem List Decreased activity tolerance;Decreased balance;Decreased mobility;Decreased knowledge of use of DME;Decreased safety awareness;Decreased knowledge of precautions;Decreased cognition  PT Treatment Interventions DME instruction;Gait training;Therapeutic activities;Functional mobility training;Therapeutic exercise;Stair training;Balance training;Patient/family education   PT Goals (Current goals can be found in the Care Plan section) Acute Rehab PT Goals Patient Stated Goal: [facility] PT Goal Formulation: With patient/family Time For Goal Achievement: 12/21/14 Potential to Achieve Goals: Good    Frequency Min 3X/week   Barriers to discharge Decreased caregiver support      Co-evaluation               End of Session Equipment Utilized During Treatment: Gait belt Activity Tolerance: Patient limited by fatigue Patient left: in chair;with call bell/phone within reach;with chair alarm set;with family/visitor present Nurse Communication: Mobility status         Time: 1012-1033 PT Time Calculation (min) (ACUTE ONLY): 21 min   Charges:   PT Evaluation $Initial PT Evaluation Tier I: 1 Procedure     PT G CodesDenice Paradise December 22, 2014, 11:57 AM Amanda [place] Acute Rehabilitation [phone] (pager)

## 2014-12-15 ENCOUNTER — Other Ambulatory Visit: Payer: Self-pay | Admitting: Cardiology

## 2014-12-15 DIAGNOSIS — I4892 Unspecified atrial flutter: Secondary | ICD-10-CM

## 2014-12-15 DIAGNOSIS — R001 Bradycardia, unspecified: Secondary | ICD-10-CM

## 2014-12-15 DIAGNOSIS — E871 Hypo-osmolality and hyponatremia: Secondary | ICD-10-CM

## 2014-12-15 LAB — BASIC METABOLIC PANEL
Anion gap: 9 (ref 5–15)
BUN: 20 mg/dL (ref 6–20)
CALCIUM: 8.6 mg/dL — AB (ref 8.9–10.3)
CO2: 26 mmol/L (ref 22–32)
Chloride: 90 mmol/L — ABNORMAL LOW (ref 101–111)
Creatinine, Ser: 0.83 mg/dL (ref 0.44–1.00)
GFR calc Af Amer: >60 mL/min (ref >60)
GFR calc non Af Amer: >60 mL/min (ref >60)
Glucose, Bld: 127 mg/dL — ABNORMAL HIGH (ref 65–99)
Potassium: 4.3 mmol/L (ref 3.5–5.1)
SODIUM: 125 mmol/L — AB (ref 135–145)

## 2014-12-15 LAB — OSMOLALITY, URINE: OSMOLALITY UR: 482 mosm/kg (ref 390–1090)

## 2014-12-15 MED ORDER — FUROSEMIDE 20 MG PO TABS
20.0000 mg | ORAL_TABLET | Freq: Every day | ORAL | Status: DC
  Administered 2014-12-16: 20 mg via ORAL
  Filled 2014-12-15: qty 1

## 2014-12-15 NOTE — Progress Notes (Signed)
Patient Demographics  Crystal Cooper, is a 81 y.o. female, DOB - [birth year], [id number]  Admit date - 12/12/2014   Admitting Physician Ivor Costa, MD  Outpatient Primary MD for the patient is Stephens Shire, MD  LOS - 3   Chief Complaint  Patient presents with  . Respiratory Distress    Patient was in respiratory distress that started three days ago with expiratory and inspiratory wheezing. Initial Sats 89% on room air, A-Fib to Sinus Tach to A-Fib. Patient was given albuterol neb 5mg  and 125mg  of Solumedrol. Patient said that inhaler was not working.       Admission HPI/Brief narrative: 81 y.o. female, with  history of diastolic CHF, SVT, asthmatic bronchitis, aortic stenosis, hypertension, presents withshortness of breath, patient , echo in May 2016 showing grade 2 diastolic dysfunction, EF [id number], initially requiring BiPAP, significant improvement with IV diuresis. Patient developed a flutter on 12/13/14  Subjective:   Crystal Cooper today has, No headache, No chest pain, No abdominal pain - No Nausea, No new weakness tingling or numbness, No Cough , denies any further shortness of breath.  Assessment & Plan    Active Problems:   Hypertension   SVT (supraventricular tachycardia)   CHF (congestive heart failure)   Atrial flutter  Acute diastolic CHF - 2-D echo on 5/16 showing EF 55%, with a grade 2 diastolic dysfunction. - Initially on IV Lasix 40 mg twice a day, strict ins and outs, -5700 L since admission,monitor daily weight, currently appears to be euvolemic, continue with Lasix 20 mg oral daily.   Chest pain - During dyspnea, is likely related to CHF, continue to monitor on telemetry, no recurrence. - Troponin has plateaued 0.11>> 0.10>> 0.10 - Discussed with cardiology,  normal stress test 12/2011, this is most likely related to demand ischemia, not a candidate for any further  testing.  Atrial flutter - Cardiology consult appreciated,CHA2DS2 VASc score of 5 , rate controlled, started on Eliquis, will need 30 days monitor as an outpatient, to be arranged by Dr. Stanford Breed as an outpatient.  Hyponatremia - workup significant for elevated urine sodium ,  urine osmolality of 482 , serum osmolality 269 , workup significant for possible SIADH , will continue with low-dose Lasix , and have patient on fluid restriction 1200 mL per day , patient endorses least 6 large cups of water ingestion per day .  Hypertension - Continue with Norvasc.  Hypokelmia - Repleted,  magnesium within normal limits  History of SVT - Monitor on telemetry  Code Status: FULL  Family Communication: Discussed with patient, none at bedside  Disposition Plan: Continue with telemetry, significant hyponatremia prevent current discharge plan for today .   Procedures None   Consults   None   Medications  Scheduled Meds: . amLODipine  5 mg Oral Daily  . antiseptic oral rinse  7 mL Mouth Rinse BID  . apixaban  2.5 mg Oral BID  . furosemide  20 mg Oral Daily  . sodium chloride  3 mL Intravenous Q12H   Continuous Infusions:  PRN Meds:.sodium chloride, acetaminophen, albuterol, ondansetron (ZOFRAN) IV, sodium chloride  DVT Prophylaxis  Heparin -  Lab Results  Component Value Date   PLT 196 12/12/2014    Antibiotics  Anti-infectives    None          Objective:   Filed Vitals:   12/14/14 1432 12/14/14 2129 12/15/14 0451 12/15/14 1329  BP: 116/47 142/60 142/73 117/58  Pulse: 62 67 86 70  Temp: 97.4 F (36.3 C) 97.9 F (36.6 C) 97.8 F (36.6 C) 97.6 F (36.4 C)  TempSrc: Oral Oral Oral Oral  Resp: 20 20 20 20   Height:      Weight:   55.3 kg (121 lb 14.6 oz)   SpO2: 97% 94% 92% 99%    Wt Readings from Last 3 Encounters:  12/15/14 55.3 kg (121 lb 14.6 oz)  05/25/14 56.291 kg (124 lb 1.6 oz)  11/13/13 56.7 kg (125 lb)     Intake/Output Summary (Last 24  hours) at 12/15/14 1350 Last data filed at 12/15/14 1200  Gross per 24 hour  Intake    580 ml  Output   1350 ml  Net   -770 ml     Physical Exam  Awake Alert, Oriented X 3, No new F.N deficits, Normal affect .AT,PERRAL Supple Neck,+ JVD, No cervical lymphadenopathy appriciated.  Symmetrical Chest wall movement, Good air movement bilaterally, Irregular,No Gallops,Rubs, 3/6 Murmur,NO parasternal heave  +ve B.Sounds, Abd Soft, No tenderness, No organomegaly appriciated, No rebound - guarding or rigidity. No Cyanosis, Clubbing or edema, No new Rash or bruise     Data Review   Micro Results Recent Results (from the past 240 hour(s))  MRSA PCR Screening     Status: None   Collection Time: 12/12/14 10:00 AM  Result Value Ref Range Status   MRSA by PCR NEGATIVE NEGATIVE Final    Comment:        The GeneXpert MRSA Assay (FDA approved for NASAL specimens only), is one component of a comprehensive MRSA colonization surveillance program. It is not intended to diagnose MRSA infection nor to guide or monitor treatment for MRSA infections.     Radiology Reports Dg Chest Port 1 View  12/12/2014   CLINICAL DATA:  Shortness of breath. History of asthma, atrial fibrillation, nonsmoker. Respiratory distress starting 3 days ago with expiratory and inspiratory wheezing. Decreased oxygen saturations.  EXAM: PORTABLE CHEST - 1 VIEW  COMPARISON:  None.  FINDINGS: Mild cardiac enlargement and pulmonary vascular congestion. Diffuse interstitial infiltrates throughout the lungs likely representing edema. No blunting of costophrenic angles. No pneumothorax. No focal consolidation. Calcified aorta.  IMPRESSION: Cardiac enlargement with pulmonary vascular congestion and diffuse interstitial pattern suggesting edema.   Electronically Signed   By: Lucienne Capers M.D.   On: 12/12/2014 04:29     CBC  Recent Labs Lab 12/12/14 0432  WBC 8.3  HGB 12.5  HCT 36.9  PLT 196  MCV 92.3  MCH 31.3   MCHC 33.9  RDW 12.5  LYMPHSABS 1.2  MONOABS 0.7  EOSABS 0.1  BASOSABS 0.0    Chemistries   Recent Labs Lab 12/12/14 0432 12/13/14 0357 12/14/14 0410 12/14/14 0900 12/15/14 0345  NA 132* 132* 127*  --  125*  K 3.1* 4.5 3.4*  --  4.3  CL 98* 97* 90*  --  90*  CO2 24 28 29   --  26  GLUCOSE 160* 115* 108*  --  127*  BUN 15 22* 20  --  20  CREATININE 0.67 0.87 0.76  --  0.83  CALCIUM 8.8* 8.7* 8.5*  --  8.6*  MG  --   --   --  1.9  --    ------------------------------------------------------------------------------------------------------------------  estimated creatinine clearance is 41 mL/min (by C-G formula based on Cr of 0.83). ------------------------------------------------------------------------------------------------------------------ No results for input(s): HGBA1C in the last 72 hours. ------------------------------------------------------------------------------------------------------------------ No results for input(s): CHOL, HDL, LDLCALC, TRIG, CHOLHDL, LDLDIRECT in the last 72 hours. ------------------------------------------------------------------------------------------------------------------ No results for input(s): TSH, T4TOTAL, T3FREE, THYROIDAB in the last 72 hours.  Invalid input(s): FREET3 ------------------------------------------------------------------------------------------------------------------ No results for input(s): VITAMINB12, FOLATE, FERRITIN, TIBC, IRON, RETICCTPCT in the last 72 hours.  Coagulation profile No results for input(s): INR, PROTIME in the last 168 hours.  No results for input(s): DDIMER in the last 72 hours.  Cardiac Enzymes  Recent Labs Lab 12/12/14 1014 12/12/14 1540 12/12/14 2200  TROPONINI 0.11* 0.10* 0.10*   ------------------------------------------------------------------------------------------------------------------ Invalid input(s): POCBNP     Time Spent in minutes   30 minutes   Crystal Cooper,  Najiyah Paris M.D on 12/15/2014 at 1:50 PM  Between 7am to 7pm - Pager - [phone]  After 7pm go to [email or web address] - password [facility]  [facility]   Office  [phone]

## 2014-12-15 NOTE — Progress Notes (Signed)
  I reviewed telemetry. She is still in atrial flutter with a CVR. No issues with RVR or bradycardia. She is anticoagulated with Eliquis. No indication for med changes. Per Dr. Rosezella Florida progress note from 6/21, she will need a 30 day monitor and f/u with Dr. Stanford Breed. I have placed order for monitor and f/u appointment is scheduled. Our office will call with appointment for monitor pick-up. Clinic f/u appt date and time is listed in discharge AVS.   SIMMONS, BRITTAINY 12/15/2014

## 2014-12-16 LAB — BASIC METABOLIC PANEL
ANION GAP: 8 (ref 5–15)
BUN: 16 mg/dL (ref 6–20)
CALCIUM: 8.3 mg/dL — AB (ref 8.9–10.3)
CO2: 26 mmol/L (ref 22–32)
Chloride: 89 mmol/L — ABNORMAL LOW (ref 101–111)
Creatinine, Ser: 0.76 mg/dL (ref 0.44–1.00)
GFR calc Af Amer: >60 mL/min (ref >60)
Glucose, Bld: 132 mg/dL — ABNORMAL HIGH (ref 65–99)
POTASSIUM: 3.9 mmol/L (ref 3.5–5.1)
SODIUM: 123 mmol/L — AB (ref 135–145)

## 2014-12-16 MED ORDER — AMLODIPINE BESYLATE 2.5 MG PO TABS
2.5000 mg | ORAL_TABLET | Freq: Every day | ORAL | Status: DC
  Administered 2014-12-16 – 2014-12-18 (×3): 2.5 mg via ORAL
  Filled 2014-12-16 (×4): qty 1

## 2014-12-16 MED ORDER — FUROSEMIDE 40 MG PO TABS
40.0000 mg | ORAL_TABLET | Freq: Every day | ORAL | Status: DC
  Administered 2014-12-17 – 2014-12-19 (×3): 40 mg via ORAL
  Filled 2014-12-16 (×3): qty 1

## 2014-12-16 MED ORDER — DEMECLOCYCLINE HCL 150 MG PO TABS
300.0000 mg | ORAL_TABLET | Freq: Two times a day (BID) | ORAL | Status: DC
  Administered 2014-12-16 – 2014-12-19 (×7): 300 mg via ORAL
  Filled 2014-12-16 (×7): qty 2

## 2014-12-16 NOTE — Progress Notes (Signed)
Physical Therapy Treatment Patient Details Name: Crystal Cooper MRN: [id number] DOB: 07/17/1926 Today's Date: 12/16/2014    History of Present Illness 81 y/o female with chronic diastolic CHF, aortic stenosis and SVT s/p ablation in 2003, admitted for a/c CHF. Also found to be in atrial flutter.     PT Comments    Pt feeling "okay".  Daughter in room and very attentive.  Pt OOB in recliner.  Assisted with amb in hallway with RW.  Prior used no AD at home.  Daughter would like pt to use all the time but she feels pt won't remember to use it.    Follow Up Recommendations  Home health PT;Supervision - Intermittent     Equipment Recommendations  None recommended by PT    Recommendations for Other Services       Precautions / Restrictions Precautions Precautions: Fall Precaution Comments: fluid restrictions Restrictions Weight Bearing Restrictions: No    Mobility  Bed Mobility               General bed mobility comments: Pt OOB in recliner  Transfers Overall transfer level: Needs assistance Equipment used: Rolling walker (2 wheeled) Transfers: Sit to/from Stand Sit to Stand: Supervision;Min guard         General transfer comment: 25% VC's to push up from reclliner and not pull up from walker  Ambulation/Gait Ambulation/Gait assistance: Supervision;Min guard Ambulation Distance (Feet): 125 Feet Assistive device: Rolling walker (2 wheeled) Gait Pattern/deviations: Step-through pattern;Decreased stride length;Trunk flexed;Shuffle Gait velocity: decreased   General Gait Details: used RW for increased support.  required 25% VC's for proper walker to self distance and upright posture.    Stairs            Wheelchair Mobility    Modified Rankin (Stroke Patients Only)       Balance                                    Cognition Arousal/Alertness: Awake/alert Behavior During Therapy: WFL for tasks assessed/performed Overall Cognitive  Status: Within Functional Limits for tasks assessed                      Exercises      General Comments        Pertinent Vitals/Pain Pain Assessment: No/denies pain    Home Living                      Prior Function            PT Goals (current goals can now be found in the care plan section) Progress towards PT goals: Progressing toward goals    Frequency  Min 3X/week    PT Plan      Co-evaluation             End of Session Equipment Utilized During Treatment: Gait belt Activity Tolerance: Patient tolerated treatment well Patient left: in chair;with call bell/phone within reach;with chair alarm set;with family/visitor present     Time: [id number] PT Time Calculation (min) (ACUTE ONLY): 24 min  Charges:  $Gait Training: 8-22 mins $Therapeutic Activity: 8-22 mins                    G Codes:      Rica Koyanagi  PTA [facility]  Acute  Rehab Pager      [phone]

## 2014-12-16 NOTE — Progress Notes (Signed)
Patient Demographics  Crystal Cooper, is a 81 y.o. female, DOB - [birth year], [id number]  Admit date - 12/12/2014   Admitting Physician Ivor Costa, MD  Outpatient Primary MD for the patient is Stephens Shire, MD  LOS - 4   Chief Complaint  Patient presents with  . Respiratory Distress    Patient was in respiratory distress that started three days ago with expiratory and inspiratory wheezing. Initial Sats 89% on room air, A-Fib to Sinus Tach to A-Fib. Patient was given albuterol neb 5mg  and 125mg  of Solumedrol. Patient said that inhaler was not working.       Admission HPI/Brief narrative: 81 y.o. female, with  history of diastolic CHF, SVT, asthmatic bronchitis, aortic stenosis, hypertension, presents withshortness of breath, patient , echo in May 2016 showing grade 2 diastolic dysfunction, EF [id number], initially requiring BiPAP, significant improvement with IV diuresis. Patient developed a flutter on 12/13/14, rate controlled, started on anticoagulation, patient hospital stay was prolonged secondary to worsening hyponatremia.  Subjective:   Crystal Cooper today has, No headache, No chest pain, No abdominal pain - No Nausea, No new weakness tingling or numbness, No Cough , denies any further shortness of breath.  Assessment & Plan    Active Problems:   Hypertension   SVT (supraventricular tachycardia)   CHF (congestive heart failure)   Atrial flutter  Acute diastolic CHF - 2-D echo on 5/16 showing EF 55%, with a grade 2 diastolic dysfunction. - Initially on IV Lasix 40 mg twice a day, strict ins and outs, -6070 L since admission,monitor daily weight, currently appears to be euvolemic, continue with Lasix 20 mg oral daily.   Chest pain - During dyspnea, is likely related to CHF, continue to monitor on telemetry, no recurrence. - Troponin has plateaued 0.11>> 0.10>> 0.10 - Discussed with cardiology,   normal stress test 12/2011, this is most likely related to demand ischemia, not a candidate for any further testing.  Atrial flutter - Cardiology consult appreciated,CHA2DS2 VASc score of 5 , rate controlled, started on Eliquis, will need 30 days monitor as an outpatient, to be arranged by Dr. Stanford Breed as an outpatient.  Hyponatremia - workup significant for elevated urine sodium ,  urine osmolality of 482 , serum osmolality 269 ,  patient was started on fluid restriction yesterday, despite that she still continued to have worsening hyponatremia, so nephrology service were consulted for assistance for management of patients hyponatremia., will continue with low-dose Lasix ,   Hypertension - Continue with Norvasc.  Hypokelmia - Repleted,  magnesium within normal limits  History of SVT - Monitor on telemetry  Code Status: FULL  Family Communication: Discussed with patient,spoke with daughter over the phone  Disposition Plan: Continue with telemetry, significant hyponatremia prevent current discharge plan  .   Procedures None   Consults   None   Medications  Scheduled Meds: . amLODipine  5 mg Oral Daily  . antiseptic oral rinse  7 mL Mouth Rinse BID  . apixaban  2.5 mg Oral BID  . furosemide  20 mg Oral Daily  . sodium chloride  3 mL Intravenous Q12H   Continuous Infusions:  PRN Meds:.sodium chloride, acetaminophen, albuterol, ondansetron (ZOFRAN) IV, sodium chloride  DVT Prophylaxis  Heparin -  Lab Results  Component Value Date   PLT 196 12/12/2014    Antibiotics    Anti-infectives    None          Objective:   Filed Vitals:   12/15/14 0451 12/15/14 1329 12/15/14 2122 12/16/14 0504  BP: 142/73 117/58 132/64 132/62  Pulse: 86 70 88 67  Temp: 97.8 F (36.6 C) 97.6 F (36.4 C) 98.1 F (36.7 C) 98.6 F (37 C)  TempSrc: Oral Oral Oral Oral  Resp: 20 20 20 20   Height:      Weight: 55.3 kg (121 lb 14.6 oz)   55.2 kg (121 lb 11.1 oz)  SpO2: 92% 99% 98%  98%    Wt Readings from Last 3 Encounters:  12/16/14 55.2 kg (121 lb 11.1 oz)  05/25/14 56.291 kg (124 lb 1.6 oz)  11/13/13 56.7 kg (125 lb)     Intake/Output Summary (Last 24 hours) at 12/16/14 1258 Last data filed at 12/16/14 1011  Gross per 24 hour  Intake    480 ml  Output    850 ml  Net   -370 ml     Physical Exam  Awake Alert, Oriented X 3, No new F.N deficits, Normal affect [place].AT,PERRAL Supple Neck,+ JVD, No cervical lymphadenopathy appriciated.  Symmetrical Chest wall movement, Good air movement bilaterally, Irregular,No Gallops,Rubs, 3/6 Murmur,NO parasternal heave  +ve B.Sounds, Abd Soft, No tenderness, No organomegaly appriciated, No rebound - guarding or rigidity. No Cyanosis, Clubbing or edema, No new Rash or bruise     Data Review   Micro Results Recent Results (from the past 240 hour(s))  MRSA PCR Screening     Status: None   Collection Time: 12/12/14 10:00 AM  Result Value Ref Range Status   MRSA by PCR NEGATIVE NEGATIVE Final    Comment:        The GeneXpert MRSA Assay (FDA approved for NASAL specimens only), is one component of a comprehensive MRSA colonization surveillance program. It is not intended to diagnose MRSA infection nor to guide or monitor treatment for MRSA infections.     Radiology Reports Dg Chest Port 1 View  12/12/2014   CLINICAL DATA:  Shortness of breath. History of asthma, atrial fibrillation, nonsmoker. Respiratory distress starting 3 days ago with expiratory and inspiratory wheezing. Decreased oxygen saturations.  EXAM: PORTABLE CHEST - 1 VIEW  COMPARISON:  None.  FINDINGS: Mild cardiac enlargement and pulmonary vascular congestion. Diffuse interstitial infiltrates throughout the lungs likely representing edema. No blunting of costophrenic angles. No pneumothorax. No focal consolidation. Calcified aorta.  IMPRESSION: Cardiac enlargement with pulmonary vascular congestion and diffuse interstitial pattern suggesting edema.    Electronically Signed   By: Lucienne Capers M.D.   On: 12/12/2014 04:29     CBC  Recent Labs Lab 12/12/14 0432  WBC 8.3  HGB 12.5  HCT 36.9  PLT 196  MCV 92.3  MCH 31.3  MCHC 33.9  RDW 12.5  LYMPHSABS 1.2  MONOABS 0.7  EOSABS 0.1  BASOSABS 0.0    Chemistries   Recent Labs Lab 12/12/14 0432 12/13/14 0357 12/14/14 0410 12/14/14 0900 12/15/14 0345 12/16/14 0450  NA 132* 132* 127*  --  125* 123*  K 3.1* 4.5 3.4*  --  4.3 3.9  CL 98* 97* 90*  --  90* 89*  CO2 24 28 29   --  26 26  GLUCOSE 160* 115* 108*  --  127* 132*  BUN 15 22* 20  --  20 16  CREATININE 0.67 0.87 0.76  --  0.83 0.76  CALCIUM 8.8* 8.7* 8.5*  --  8.6* 8.3*  MG  --   --   --  1.9  --   --    ------------------------------------------------------------------------------------------------------------------ estimated creatinine clearance is 42.5 mL/min (by C-G formula based on Cr of 0.76). ------------------------------------------------------------------------------------------------------------------ No results for input(s): HGBA1C in the last 72 hours. ------------------------------------------------------------------------------------------------------------------ No results for input(s): CHOL, HDL, LDLCALC, TRIG, CHOLHDL, LDLDIRECT in the last 72 hours. ------------------------------------------------------------------------------------------------------------------ No results for input(s): TSH, T4TOTAL, T3FREE, THYROIDAB in the last 72 hours.  Invalid input(s): FREET3 ------------------------------------------------------------------------------------------------------------------ No results for input(s): VITAMINB12, FOLATE, FERRITIN, TIBC, IRON, RETICCTPCT in the last 72 hours.  Coagulation profile No results for input(s): INR, PROTIME in the last 168 hours.  No results for input(s): DDIMER in the last 72 hours.  Cardiac Enzymes  Recent Labs Lab 12/12/14 1014 12/12/14 1540 12/12/14 2200   TROPONINI 0.11* 0.10* 0.10*   ------------------------------------------------------------------------------------------------------------------ Invalid input(s): POCBNP     Time Spent in minutes   30 minutes   Praneeth Bussey M.D on 12/16/2014 at 12:58 PM  Between 7am to 7pm - Pager - [phone]  After 7pm go to [email or web address] - password [email or web address]  [facility]   Office  [phone]

## 2014-12-16 NOTE — Care Management Note (Signed)
Case Management Note  Patient Details  Name: Crystal Cooper MRN: [id number] Date of Birth: 1926/11/02  Subjective/Objective:  Hyponatremia.   [facility] rep already following,& has [place] orders,just awaiting d/c order.              Action/Plan:d/c plan d/c plan home w/HHC.   Expected Discharge Date:  12/14/14               Expected Discharge Plan:  [place]  In-House Referral:     Discharge planning Services  CM Consult  Post Acute Care Choice:    Choice offered to:     DME Arranged:    DME Agency:     HH Arranged:  RN, PT, OT Rosa Sanchez Agency:  [facility]  Status of Service:  In process, will continue to follow  Medicare Important Message Given:  Yes Date Medicare IM Given:  12/16/14 Medicare IM give by:  Dessa Phi Date Additional Medicare IM Given:    Additional Medicare Important Message give by:     If discussed at [place] of Stay Meetings, dates discussed:    Additional Comments:  Dessa Phi, RN 12/16/2014, 3:42 PM

## 2014-12-16 NOTE — Consult Note (Signed)
Reason for Consult: Hyponatremia Referring Physician: Elgergawy  Crystal Cooper is an 81 y.o. female with past medical history significant for hypertension, atrial fibrillation also with SVT as well as aortic stenosis and diastolic dysfunction.  She was noted to present on 6/19 with shortness of breath- chest x-ray consistent with congestive heart failure. She was placed on Lasix and diuresed quite successfully. On 621 her sodium was noted to decrease from 132 to 127 so Lasix was decreased and is now at a level of 20 mg oral daily. Ins and outs of been pretty even last 24 hours. Patient had been put on a fluid restriction yesterday. Interestingly, even on the Lasix urine osmolality is elevated at 482 Indicating inappropriate concentration of urine. Patient also had been drinking large amounts of water before fluid restriction was put in place.  She does not appear to be dry or wet on exam.  She tells me that this has happened before and was managed as an OP- she doesn't think anything special was done- "they just kept checking labs until it got better " according to the daughter    Trend in Creatinine: CREATININE, SER  Date/Time Value Ref Range Status  12/16/2014 04:50 AM 0.76 0.44 - 1.00 mg/dL Final  12/15/2014 03:45 AM 0.83 0.44 - 1.00 mg/dL Final  12/14/2014 04:10 AM 0.76 0.44 - 1.00 mg/dL Final  12/13/2014 03:57 AM 0.87 0.44 - 1.00 mg/dL Final  12/12/2014 04:32 AM 0.67 0.44 - 1.00 mg/dL Final   SODIUM  Date/Time Value Ref Range Status  12/16/2014 04:50 AM 123* 135 - 145 mmol/L Final  12/15/2014 03:45 AM 125* 135 - 145 mmol/L Final  12/14/2014 04:10 AM 127* 135 - 145 mmol/L Final  12/13/2014 03:57 AM 132* 135 - 145 mmol/L Final  12/12/2014 04:32 AM 132* 135 - 145 mmol/L Final   PMH:   Past Medical History  Diagnosis Date  . HTN (hypertension)   . Asthma   . Glaucoma   . Hearing loss   . Prolapsing mitral leaflet syndrome   . SVT (supraventricular tachycardia)     S/P ablation of  AVNRT in 2003  . Aortic stenosis   . Atrial fibrillation     PSH:   Past Surgical History  Procedure Laterality Date  . Appendectomy    . Bladder surgery    . Cardiac surgery    . Nasal sinus surgery      Allergies:  Allergies  Allergen Reactions  . Codeine   . Hctz [Hydrochlorothiazide]     Medications:   Prior to Admission medications   Medication Sig Start Date End Date Taking? Authorizing Provider  albuterol (PROVENTIL HFA;VENTOLIN HFA) 108 (90 BASE) MCG/ACT inhaler Inhale 2 puffs into the lungs every 4 (four) hours as needed.   Yes Historical Provider, MD  amLODipine (NORVASC) 5 MG tablet Take 5 mg by mouth daily.   Yes Historical Provider, MD  BIOTIN FORTE PO Take by mouth daily.   Yes Historical Provider, MD  MULTIPLE VITAMIN PO Take by mouth.   Yes Historical Provider, MD    Discontinued Meds:   Medications Discontinued During This Encounter  Medication Reason  . furosemide (LASIX) injection 40 mg   . furosemide (LASIX) tablet 40 mg   . heparin injection 5,000 Units   . furosemide (LASIX) tablet 40 mg     Social History:  reports that she has never smoked. She does not have any smokeless tobacco history on file. She reports that she does not drink alcohol  or use illicit drugs.  Family History:   Family History  Problem Relation Age of Onset  . Coronary artery disease Brother     MI at age 38    A comprehensive review of systems was negative except for: Respiratory: positive for SOB now improved Musculoskeletal: positive for muscle weakness  Blood pressure 132/62, pulse 67, temperature 98.6 F (37 C), temperature source Oral, resp. rate 20, height 5\' 3"  (1.6 m), weight 55.2 kg (121 lb 11.1 oz), SpO2 98 %. General appearance: alert, cooperative and looks good for age Eyes: conjunctivae/corneas clear. PERRL, EOM's intact. Fundi benign. Neck: no adenopathy, no carotid bruit, no JVD, supple, symmetrical, trachea midline and thyroid not enlarged, symmetric, no  tenderness/mass/nodules Resp: clear to auscultation bilaterally Cardio: regular rate and rhythm and systolic murmur: systolic ejection 2/6, harsh at 2nd left intercostal space GI: soft, non-tender; bowel sounds normal; no masses,  no organomegaly Extremities: extremities normal, atraumatic, no cyanosis or edema  Labs: Basic Metabolic Panel:  Recent Labs Lab 12/12/14 0432 12/13/14 0357 12/14/14 0410 12/15/14 0345 12/16/14 0450  NA 132* 132* 127* 125* 123*  K 3.1* 4.5 3.4* 4.3 3.9  CL 98* 97* 90* 90* 89*  CO2 24 28 29 26 26   GLUCOSE 160* 115* 108* 127* 132*  BUN 15 22* 20 20 16   CREATININE 0.67 0.87 0.76 0.83 0.76  CALCIUM 8.8* 8.7* 8.5* 8.6* 8.3*   Liver Function Tests: No results for input(s): AST, ALT, ALKPHOS, BILITOT, PROT, ALBUMIN in the last 168 hours. No results for input(s): LIPASE, AMYLASE in the last 168 hours. No results for input(s): AMMONIA in the last 168 hours. CBC:  Recent Labs Lab 12/12/14 0432  WBC 8.3  NEUTROABS 6.2  HGB 12.5  HCT 36.9  MCV 92.3  PLT 196   PT/INR: @labrcntip (inr:5) Cardiac Enzymes:  Recent Labs Lab 12/12/14 1014 12/12/14 1540 12/12/14 2200  TROPONINI 0.11* 0.10* 0.10*   CBG: No results for input(s): GLUCAP in the last 168 hours.  Iron Studies: No results for input(s): IRON, TIBC, TRANSFERRIN, FERRITIN in the last 168 hours.  Xrays/Other Studies: No results found.   Assessment/Plan: 81 year old WF with a history of hyponatremia now admitted with CHF- better with diuresis but then worsening of hyponatremia - has an inappropriately high urine osm  1) Hyponatremia- Initially needed to be diuresed and was diuresed appropriately- now seems euvolemic- however, the issue is that she was taking in water (hypoosmolar fluid) and she is putting out urine with an inappropriate high osm SIADH- so is left with extra free water in her system.  Would continue with fluid restriction and minimizing water- then needs something to dilute  urine-  Lasix should do it but for some reason it is not doing- will increase dose to 40 mg daily and then also add demeclocycline as an additional agent to dilute urine.  It should equilibrate it just sometimes takes a little while- if no better tomorrow I will consider a dose of samsca  2) Hypertension- well controlled - will decrease norvasc if has anything to do with edema    Thank you for this consult, I will continue to follow    Sneijder Bernards A 12/16/2014, 11:37 AM

## 2014-12-17 DIAGNOSIS — E871 Hypo-osmolality and hyponatremia: Secondary | ICD-10-CM

## 2014-12-17 LAB — BASIC METABOLIC PANEL
ANION GAP: 10 (ref 5–15)
ANION GAP: 10 (ref 5–15)
BUN: 23 mg/dL — ABNORMAL HIGH (ref 6–20)
BUN: 26 mg/dL — AB (ref 6–20)
CALCIUM: 8.5 mg/dL — AB (ref 8.9–10.3)
CO2: 25 mmol/L (ref 22–32)
CO2: 26 mmol/L (ref 22–32)
CREATININE: 0.89 mg/dL (ref 0.44–1.00)
Calcium: 8.5 mg/dL — ABNORMAL LOW (ref 8.9–10.3)
Chloride: 87 mmol/L — ABNORMAL LOW (ref 101–111)
Chloride: 86 mmol/L — ABNORMAL LOW (ref 101–111)
Creatinine, Ser: 1.03 mg/dL — ABNORMAL HIGH (ref 0.44–1.00)
GFR calc Af Amer: >60 mL/min (ref >60)
GFR, EST AFRICAN AMERICAN: 56 mL/min — AB (ref >60)
GFR, EST NON AFRICAN AMERICAN: 57 mL/min — AB (ref >60)
GFR, EST NON AFRICAN AMERICAN: 48 mL/min — AB (ref >60)
Glucose, Bld: 115 mg/dL — ABNORMAL HIGH (ref 65–99)
Glucose, Bld: 167 mg/dL — ABNORMAL HIGH (ref 65–99)
POTASSIUM: 4.2 mmol/L (ref 3.5–5.1)
Potassium: 4.1 mmol/L (ref 3.5–5.1)
SODIUM: 122 mmol/L — AB (ref 135–145)
Sodium: 122 mmol/L — ABNORMAL LOW (ref 135–145)

## 2014-12-17 MED ORDER — TOLVAPTAN 15 MG PO TABS
15.0000 mg | ORAL_TABLET | Freq: Once | ORAL | Status: AC
  Administered 2014-12-17: 15 mg via ORAL
  Filled 2014-12-17: qty 1

## 2014-12-17 NOTE — Progress Notes (Signed)
Patient Demographics  Crystal Cooper, is a 81 y.o. female, DOB - March 12, 1927, [id number]  Admit date - 12/12/2014   Admitting Physician Ivor Costa, MD  Outpatient Primary MD for the patient is Stephens Shire, MD  LOS - 5   Chief Complaint  Patient presents with  . Respiratory Distress    Patient was in respiratory distress that started three days ago with expiratory and inspiratory wheezing. Initial Sats 89% on room air, A-Fib to Sinus Tach to A-Fib. Patient was given albuterol neb 5mg  and 125mg  of Solumedrol. Patient said that inhaler was not working.       Admission HPI/Brief narrative: 81 y.o. female, with  history of diastolic CHF, SVT, asthmatic bronchitis, aortic stenosis, hypertension, presents with shortness of breath, patient , echo in May 2016 showing grade 2 diastolic dysfunction, EF [id number], initially requiring BiPAP, significant improvement with IV diuresis. Patient developed a flutter on 12/13/14, rate controlled, started on anticoagulation, patient hospital stay was prolonged secondary to worsening hyponatremia.  Subjective:   Crystal Cooper today has, No headache, No chest pain, No abdominal pain - No Nausea, No new weakness tingling or numbness, No Cough , denies any further shortness of breath.  Assessment & Plan    Active Problems:   Hypertension   SVT (supraventricular tachycardia)   CHF (congestive heart failure)   Atrial flutter  Hyponatremia - workup significant for elevated urine sodium ,  urine osmolality of 482 , serum osmolality 269 ,  Despite being on strict fluid restriction she continues to have worsening hyponatremia her sodium level is 122 today. - Nephrology consult greatly appreciated, will continue with fluid restriction, Lasix increased to 40 mg oral daily, started on demeclocycline.   Acute diastolic CHF - 2-D echo on 5/16 showing EF 55%, with a grade 2 diastolic  dysfunction. - Initially on IV Lasix 40 mg twice a day, strict ins and outs, ,daily weight, currently appears to be euvolemic, continue with Lasix 40 mg oral daily.   Chest pain - During dyspnea, is likely related to CHF, continue to monitor on telemetry, no recurrence. - Troponin has plateaued 0.11>> 0.10>> 0.10 - Discussed with cardiology,  normal stress test 12/2011, this is most likely related to demand ischemia, not a candidate for any further testing.  Atrial flutter - Cardiology consult appreciated,CHA2DS2 VASc score of 5 , rate controlled, started on Eliquis, will need 30 days monitor as an outpatient, to be arranged by Dr. Stanford Breed as an outpatient.    Hypertension - Continue with Norvasc.  Hypokelmia - Repleted,  magnesium within normal limits  History of SVT - Monitor on telemetry  Code Status: FULL  Family Communication: Discussed with patient,spoke with daughter over the phone 12/16/14.  Disposition Plan: Continue with telemetry, significant hyponatremia prevent current discharge plan  .   Procedures None   Consults   None   Medications  Scheduled Meds: . amLODipine  2.5 mg Oral Daily  . antiseptic oral rinse  7 mL Mouth Rinse BID  . apixaban  2.5 mg Oral BID  . demeclocycline  300 mg Oral Q12H  . furosemide  40 mg Oral Daily  . sodium chloride  3 mL Intravenous Q12H   Continuous Infusions:  PRN Meds:.sodium chloride, acetaminophen, albuterol, ondansetron (ZOFRAN)  IV, sodium chloride  DVT Prophylaxis  Heparin -  Lab Results  Component Value Date   PLT 196 12/12/2014    Antibiotics    Anti-infectives    Start     Dose/Rate Route Frequency Ordered Stop   12/16/14 1400  demeclocycline (DECLOMYCIN) tablet 300 mg     300 mg Oral Every 12 hours 12/16/14 1303            Objective:   Filed Vitals:   12/16/14 1329 12/16/14 1427 12/16/14 2030 12/17/14 0400  BP: 100/41 105/50 123/59 146/65  Pulse: 78  68 67  Temp: 97.8 F (36.6 C)  98 F  (36.7 C) 98.5 F (36.9 C)  TempSrc: Oral  Oral Oral  Resp: 18  18 18   Height:      Weight:    55.3 kg (121 lb 14.6 oz)  SpO2: 97%  98% 98%    Wt Readings from Last 3 Encounters:  12/17/14 55.3 kg (121 lb 14.6 oz)  05/25/14 56.291 kg (124 lb 1.6 oz)  11/13/13 56.7 kg (125 lb)     Intake/Output Summary (Last 24 hours) at 12/17/14 1040 Last data filed at 12/17/14 0420  Gross per 24 hour  Intake    360 ml  Output    350 ml  Net     10 ml     Physical Exam  Awake Alert, Oriented X 3, No new F.N deficits, Normal affect .AT,PERRAL Supple Neck,+ JVD, No cervical lymphadenopathy appriciated.  Symmetrical Chest wall movement, Good air movement bilaterally, Irregular,No Gallops,Rubs, 3/6 Murmur,NO parasternal heave  +ve B.Sounds, Abd Soft, No tenderness, No organomegaly appriciated, No rebound - guarding or rigidity. No Cyanosis, Clubbing or edema, No new Rash or bruise     Data Review   Micro Results Recent Results (from the past 240 hour(s))  MRSA PCR Screening     Status: None   Collection Time: 12/12/14 10:00 AM  Result Value Ref Range Status   MRSA by PCR NEGATIVE NEGATIVE Final    Comment:        The GeneXpert MRSA Assay (FDA approved for NASAL specimens only), is one component of a comprehensive MRSA colonization surveillance program. It is not intended to diagnose MRSA infection nor to guide or monitor treatment for MRSA infections.     Radiology Reports Dg Chest Port 1 View  12/12/2014   CLINICAL DATA:  Shortness of breath. History of asthma, atrial fibrillation, nonsmoker. Respiratory distress starting 3 days ago with expiratory and inspiratory wheezing. Decreased oxygen saturations.  EXAM: PORTABLE CHEST - 1 VIEW  COMPARISON:  None.  FINDINGS: Mild cardiac enlargement and pulmonary vascular congestion. Diffuse interstitial infiltrates throughout the lungs likely representing edema. No blunting of costophrenic angles. No pneumothorax. No focal  consolidation. Calcified aorta.  IMPRESSION: Cardiac enlargement with pulmonary vascular congestion and diffuse interstitial pattern suggesting edema.   Electronically Signed   By: Lucienne Capers M.D.   On: 12/12/2014 04:29     CBC  Recent Labs Lab 12/12/14 0432  WBC 8.3  HGB 12.5  HCT 36.9  PLT 196  MCV 92.3  MCH 31.3  MCHC 33.9  RDW 12.5  LYMPHSABS 1.2  MONOABS 0.7  EOSABS 0.1  BASOSABS 0.0    Chemistries   Recent Labs Lab 12/13/14 0357 12/14/14 0410 12/14/14 0900 12/15/14 0345 12/16/14 0450 12/17/14 0430  NA 132* 127*  --  125* 123* 122*  K 4.5 3.4*  --  4.3 3.9 4.1  CL 97* 90*  --  90* 89* 87*  CO2 28 29  --  26 26 25   GLUCOSE 115* 108*  --  127* 132* 115*  BUN 22* 20  --  20 16 23*  CREATININE 0.87 0.76  --  0.83 0.76 0.89  CALCIUM 8.7* 8.5*  --  8.6* 8.3* 8.5*  MG  --   --  1.9  --   --   --    ------------------------------------------------------------------------------------------------------------------ estimated creatinine clearance is 38.2 mL/min (by C-G formula based on Cr of 0.89). ------------------------------------------------------------------------------------------------------------------ No results for input(s): HGBA1C in the last 72 hours. ------------------------------------------------------------------------------------------------------------------ No results for input(s): CHOL, HDL, LDLCALC, TRIG, CHOLHDL, LDLDIRECT in the last 72 hours. ------------------------------------------------------------------------------------------------------------------ No results for input(s): TSH, T4TOTAL, T3FREE, THYROIDAB in the last 72 hours.  Invalid input(s): FREET3 ------------------------------------------------------------------------------------------------------------------ No results for input(s): VITAMINB12, FOLATE, FERRITIN, TIBC, IRON, RETICCTPCT in the last 72 hours.  Coagulation profile No results for input(s): INR, PROTIME in the  last 168 hours.  No results for input(s): DDIMER in the last 72 hours.  Cardiac Enzymes  Recent Labs Lab 12/12/14 1014 12/12/14 1540 12/12/14 2200  TROPONINI 0.11* 0.10* 0.10*   ------------------------------------------------------------------------------------------------------------------ Invalid input(s): POCBNP     Time Spent in minutes   30 minutes   Barett Whidbee M.D on 12/17/2014 at 10:40 AM  Between 7am to 7pm - Pager - [phone]  After 7pm go to [email or web address] - password [facility]  [facility]   Office  [phone]

## 2014-12-17 NOTE — Progress Notes (Signed)
Physical Therapy Treatment Patient Details Name: Crystal Cooper MRN: [id number] DOB: Jun 06, 1927 Today's Date: 12/17/2014    History of Present Illness 81 y/o female with chronic diastolic CHF, aortic stenosis and SVT s/p ablation in 2003, admitted for a/c CHF. Also found to be in atrial flutter.     PT Comments    Assisted pt out of recliner to amb in hallway.  Noted decreased gait speed today with increased c/o fatigue.  Assisted to bathroom and on/off toilet.  Returned to [employer].  Pt is much safer using the walker than with out as prior to admission.   Follow Up Recommendations  Home health PT;Supervision - Intermittent     Equipment Recommendations  None recommended by PT    Recommendations for Other Services       Precautions / Restrictions Precautions Precautions: Fall Precaution Comments: fluid restrictions Restrictions Weight Bearing Restrictions: No    Mobility  Bed Mobility               General bed mobility comments: Pt OOB in recliner  Transfers Overall transfer level: Needs assistance Equipment used: Rolling walker (2 wheeled) Transfers: Sit to/from Stand Sit to Stand: Supervision         General transfer comment: increased ability to self rise and good hand placement.  Assisted out of recliner and on/off toilet.  Ambulation/Gait Ambulation/Gait assistance: Supervision;Min guard Ambulation Distance (Feet): 85 Feet Assistive device: Rolling walker (2 wheeled) Gait Pattern/deviations: Step-to pattern;Decreased stride length;Shuffle;Trunk flexed Gait velocity: decreased   General Gait Details: used RW for increased support.  required < 25% VC's for proper walker to self distance and upright posture. decreased speed today with increased c/o fatigue.   Stairs            Wheelchair Mobility    Modified Rankin (Stroke Patients Only)       Balance                                    Cognition Arousal/Alertness:  Awake/alert Behavior During Therapy: WFL for tasks assessed/performed Overall Cognitive Status: Within Functional Limits for tasks assessed                      Exercises      General Comments        Pertinent Vitals/Pain Pain Assessment: Faces Faces Pain Scale: Hurts little more Pain Location: B "thighs" Pain Descriptors / Indicators: Aching Pain Intervention(s): Monitored during session;Repositioned    Home Living                      Prior Function            PT Goals (current goals can now be found in the care plan section) Progress towards PT goals: Progressing toward goals    Frequency  Min 3X/week    PT Plan      Co-evaluation             End of Session Equipment Utilized During Treatment: Gait belt Activity Tolerance: Patient tolerated treatment well Patient left: in chair;with call bell/phone within reach;with chair alarm set;with family/visitor present     Time: [id number] PT Time Calculation (min) (ACUTE ONLY): 18 min  Charges:  $Gait Training: 8-22 mins                    G Codes:  Rica Koyanagi  PTA [facility]  Acute  Rehab Pager      [phone]

## 2014-12-17 NOTE — Progress Notes (Signed)
Subjective:  Feels fine- but then family tells me that she threw up today - sodium stable to just a little down Objective Vital signs in last 24 hours: Filed Vitals:   12/16/14 1329 12/16/14 1427 12/16/14 2030 12/17/14 0400  BP: 100/41 105/50 123/59 146/65  Pulse: 78  68 67  Temp: 97.8 F (36.6 C)  98 F (36.7 C) 98.5 F (36.9 C)  TempSrc: Oral  Oral Oral  Resp: 18  18 18   Height:      Weight:    55.3 kg (121 lb 14.6 oz)  SpO2: 97%  98% 98%   Weight change: 0.1 kg (3.5 oz)  Intake/Output Summary (Last 24 hours) at 12/17/14 1232 Last data filed at 12/17/14 1220  Gross per 24 hour  Intake    480 ml  Output    550 ml  Net    -70 ml    Assessment/Plan: 81 year old WF with a history of hyponatremia now admitted with CHF- better with diuresis but then worsening of hyponatremia - has an inappropriately high urine osm  1) Hyponatremia- Initially needed to be diuresed and was diuresed appropriately- now seems euvolemic- however, the issue is that she was taking in water (hypoosmolar fluid) and she is putting out urine with an inappropriate high osm SIADH- so is left with extra free water in her system. Would continue with fluid restriction and minimizing water- increased lasix and added demeclocycline yest- still no improvement. I will give one dose of tolvaptan to eliminate dilute urine- do not want to do too much because could cause volume depletion.   Then It should equilibrate it just sometimes takes a little while-  2) Hypertension- well controlled - have decreased norvasc if has anything to do with edema     Roger Kettles A    Labs: Basic Metabolic Panel:  Recent Labs Lab 12/15/14 0345 12/16/14 0450 12/17/14 0430  NA 125* 123* 122*  K 4.3 3.9 4.1  CL 90* 89* 87*  CO2 26 26 25   GLUCOSE 127* 132* 115*  BUN 20 16 23*  CREATININE 0.83 0.76 0.89  CALCIUM 8.6* 8.3* 8.5*   Liver Function Tests: No results for input(s): AST, ALT, ALKPHOS, BILITOT, PROT, ALBUMIN in  the last 168 hours. No results for input(s): LIPASE, AMYLASE in the last 168 hours. No results for input(s): AMMONIA in the last 168 hours. CBC:  Recent Labs Lab 12/12/14 0432  WBC 8.3  NEUTROABS 6.2  HGB 12.5  HCT 36.9  MCV 92.3  PLT 196   Cardiac Enzymes:  Recent Labs Lab 12/12/14 1014 12/12/14 1540 12/12/14 2200  TROPONINI 0.11* 0.10* 0.10*   CBG: No results for input(s): GLUCAP in the last 168 hours.  Iron Studies: No results for input(s): IRON, TIBC, TRANSFERRIN, FERRITIN in the last 72 hours. Studies/Results: No results found. Medications: Infusions:    Scheduled Medications: . amLODipine  2.5 mg Oral Daily  . antiseptic oral rinse  7 mL Mouth Rinse BID  . apixaban  2.5 mg Oral BID  . demeclocycline  300 mg Oral Q12H  . furosemide  40 mg Oral Daily  . sodium chloride  3 mL Intravenous Q12H    have reviewed scheduled and prn medications.  Physical Exam: General: NAD, HOH Heart: irreg but good rate Lungs: clear Abdomen: soft, non tender Extremities: mild edema    12/17/2014,12:32 PM  LOS: 5 days

## 2014-12-17 NOTE — Discharge Instructions (Signed)

## 2014-12-18 LAB — BASIC METABOLIC PANEL
Anion gap: 9 (ref 5–15)
Anion gap: 12 (ref 5–15)
BUN: 26 mg/dL — ABNORMAL HIGH (ref 6–20)
BUN: 24 mg/dL — ABNORMAL HIGH (ref 6–20)
CALCIUM: 8.5 mg/dL — AB (ref 8.9–10.3)
CHLORIDE: 88 mmol/L — AB (ref 101–111)
CO2: 27 mmol/L (ref 22–32)
CO2: 27 mmol/L (ref 22–32)
Calcium: 8.9 mg/dL (ref 8.9–10.3)
Chloride: 88 mmol/L — ABNORMAL LOW (ref 101–111)
Creatinine, Ser: 1.02 mg/dL — ABNORMAL HIGH (ref 0.44–1.00)
Creatinine, Ser: 0.93 mg/dL (ref 0.44–1.00)
GFR calc non Af Amer: 49 mL/min — ABNORMAL LOW (ref >60)
GFR calc non Af Amer: 54 mL/min — ABNORMAL LOW (ref >60)
GFR, EST AFRICAN AMERICAN: 56 mL/min — AB (ref >60)
GLUCOSE: 128 mg/dL — AB (ref 65–99)
GLUCOSE: 119 mg/dL — AB (ref 65–99)
Potassium: 4.2 mmol/L (ref 3.5–5.1)
Potassium: 4.3 mmol/L (ref 3.5–5.1)
Sodium: 124 mmol/L — ABNORMAL LOW (ref 135–145)
Sodium: 127 mmol/L — ABNORMAL LOW (ref 135–145)

## 2014-12-18 MED ORDER — TOLVAPTAN 15 MG PO TABS
15.0000 mg | ORAL_TABLET | Freq: Once | ORAL | Status: AC
  Administered 2014-12-18: 15 mg via ORAL
  Filled 2014-12-18: qty 1

## 2014-12-18 NOTE — Progress Notes (Signed)
Subjective:  No complaints- sodium up from 122-124 Objective Vital signs in last 24 hours: Filed Vitals:   12/17/14 1405 12/17/14 2137 12/18/14 0456 12/18/14 0457  BP: 120/52 121/55 133/55   Pulse: 60 65 71   Temp: 97.8 F (36.6 C) 97.8 F (36.6 C) 97.9 F (36.6 C)   TempSrc: Oral Oral Oral   Resp: 18 18 17    Height:      Weight:    55.2 kg (121 lb 11.1 oz)  SpO2: 97% 97% 99%    Weight change: -0.1 kg (-3.5 oz)  Intake/Output Summary (Last 24 hours) at 12/18/14 1015 Last data filed at 12/18/14 0914  Gross per 24 hour  Intake    980 ml  Output   1150 ml  Net   -170 ml    Assessment/Plan: 81 year old WF with a history of hyponatremia now admitted with CHF- better with diuresis but then worsening of hyponatremia - has an inappropriately high urine osm  1) Hyponatremia- Initially needed to be diuresed and was diuresed appropriately- now seems euvolemic- however, the issue is that she was taking in water (hypoosmolar fluid) and she is putting out urine with an inappropriate high osm SIADH- so is left with extra free water in her system. Would continue with fluid restriction and minimizing water- I have increased  lasix to 40 mg and added demeclocycline - is better s/p one dose of tolvaptan- will give another dose of tolvaptan today to eliminate dilute urine- do not want to do too much because could cause volume depletion.   Thenit should equilibrate it just sometimes takes a little while- if sodium trends up tomorrow- I think OK for discharge on lasix and demeclocycline- with close lab follow up to delineate further tomorrow 2) Hypertension- well controlled - have decreased norvasc if has anything to do with edema     Jaideep Pollack A    Labs: Basic Metabolic Panel:  Recent Labs Lab 12/17/14 0430 12/17/14 1917 12/18/14 0247  NA 122* 122* 124*  K 4.1 4.2 4.2  CL 87* 86* 88*  CO2 25 26 27   GLUCOSE 115* 167* 128*  BUN 23* 26* 26*  CREATININE 0.89 1.03* 1.02*   CALCIUM 8.5* 8.5* 8.5*   Liver Function Tests: No results for input(s): AST, ALT, ALKPHOS, BILITOT, PROT, ALBUMIN in the last 168 hours. No results for input(s): LIPASE, AMYLASE in the last 168 hours. No results for input(s): AMMONIA in the last 168 hours. CBC:  Recent Labs Lab 12/12/14 0432  WBC 8.3  NEUTROABS 6.2  HGB 12.5  HCT 36.9  MCV 92.3  PLT 196   Cardiac Enzymes:  Recent Labs Lab 12/12/14 1014 12/12/14 1540 12/12/14 2200  TROPONINI 0.11* 0.10* 0.10*   CBG: No results for input(s): GLUCAP in the last 168 hours.  Iron Studies: No results for input(s): IRON, TIBC, TRANSFERRIN, FERRITIN in the last 72 hours. Studies/Results: No results found. Medications: Infusions:    Scheduled Medications: . amLODipine  2.5 mg Oral Daily  . antiseptic oral rinse  7 mL Mouth Rinse BID  . apixaban  2.5 mg Oral BID  . demeclocycline  300 mg Oral Q12H  . furosemide  40 mg Oral Daily  . sodium chloride  3 mL Intravenous Q12H    have reviewed scheduled and prn medications.  Physical Exam: General: NAD, HOH Heart: irreg but good rate Lungs: clear Abdomen: soft, non tender Extremities: mild edema    12/18/2014,10:15 AM  LOS: 6 days

## 2014-12-18 NOTE — Progress Notes (Signed)
Patient Demographics  Crystal Cooper, is a 81 y.o. female, DOB - [birth year], [id number]  Admit date - 12/12/2014   Admitting Physician Ivor Costa, MD  Outpatient Primary MD for the patient is Stephens Shire, MD  LOS - 6   Chief Complaint  Patient presents with  . Respiratory Distress    Patient was in respiratory distress that started three days ago with expiratory and inspiratory wheezing. Initial Sats 89% on room air, A-Fib to Sinus Tach to A-Fib. Patient was given albuterol neb 5mg  and 125mg  of Solumedrol. Patient said that inhaler was not working.       Admission HPI/Brief narrative: 81 y.o. female, with  history of diastolic CHF, SVT, asthmatic bronchitis, aortic stenosis, hypertension, presents with shortness of breath, patient , echo in May 2016 showing grade 2 diastolic dysfunction, EF [id number], initially requiring BiPAP, significant improvement with IV diuresis. Patient developed a flutter on 12/13/14, rate controlled, started on anticoagulation, patient hospital stay was prolonged secondary to worsening hyponatremia.  Subjective:   Crystal Cooper today has, No headache, No chest pain, No abdominal pain - No Nausea, No new weakness tingling or numbness, No Cough , denies any further shortness of breath.  Assessment & Plan    Active Problems:   Hypertension   SVT (supraventricular tachycardia)   CHF (congestive heart failure)   Atrial flutter   Hyponatremia  Hyponatremia - workup significant for elevated urine sodium ,  urine osmolality of 482 , serum osmolality 269 , Despite being on strict fluid restriction she continues to have worsening hyponatremia . - Nephrology consult greatly appreciated, will continue with fluid restriction, Lasix increased to 40 mg oral daily, started on demeclocycline, received 1 dose of tolvaptan, sodium improved today to 124, will be given another close by  nephrology today.   Acute diastolic CHF - 2-D echo on 5/16 showing EF 55%, with a grade 2 diastolic dysfunction. - Initially on IV Lasix 40 mg twice a day, strict ins and outs, ,daily weight, currently appears to be euvolemic, continue with Lasix 40 mg oral daily.   Chest pain - During dyspnea, is likely related to CHF, continue to monitor on telemetry, no recurrence. - Troponin has plateaued 0.11>> 0.10>> 0.10 - Discussed with cardiology,  normal stress test 12/2011, this is most likely related to demand ischemia, not a candidate for any further testing.  Atrial flutter - Cardiology consult appreciated,CHA2DS2 VASc score of 5 , rate controlled, started on Eliquis, will need 30 days monitor as an outpatient, to be arranged by Dr. Stanford Breed as an outpatient.    Hypertension - Continue with Norvasc.  Hypokelmia - Repleted,  magnesium within normal limits  History of SVT - Monitor on telemetry  Code Status: FULL  Family Communication: Discussed with patient,spoke with daughter over the phone 12/16/14.  Disposition Plan: Continue with telemetry, significant hyponatremia prevent current discharge plan  , and be discharged home in a.m. if sodium continues to trend up tomorrow.   Procedures None   Consults   None   Medications  Scheduled Meds: . amLODipine  2.5 mg Oral Daily  . antiseptic oral rinse  7 mL Mouth Rinse BID  . apixaban  2.5 mg Oral BID  . demeclocycline  300 mg Oral Q12H  .  furosemide  40 mg Oral Daily  . sodium chloride  3 mL Intravenous Q12H  . tolvaptan  15 mg Oral Once   Continuous Infusions:  PRN Meds:.sodium chloride, acetaminophen, albuterol, ondansetron (ZOFRAN) IV, sodium chloride  DVT Prophylaxis  Heparin -  Lab Results  Component Value Date   PLT 196 12/12/2014    Antibiotics    Anti-infectives    Start     Dose/Rate Route Frequency Ordered Stop   12/16/14 1400  demeclocycline (DECLOMYCIN) tablet 300 mg     300 mg Oral Every 12 hours  12/16/14 1303            Objective:   Filed Vitals:   12/17/14 2137 12/18/14 0456 12/18/14 0457 12/18/14 0909  BP: 121/55 133/55  125/51  Pulse: 65 71  76  Temp: 97.8 F (36.6 C) 97.9 F (36.6 C)  97.4 F (36.3 C)  TempSrc: Oral Oral  Oral  Resp: 18 17    Height:      Weight:   55.2 kg (121 lb 11.1 oz)   SpO2: 97% 99%      Wt Readings from Last 3 Encounters:  12/18/14 55.2 kg (121 lb 11.1 oz)  05/25/14 56.291 kg (124 lb 1.6 oz)  11/13/13 56.7 kg (125 lb)     Intake/Output Summary (Last 24 hours) at 12/18/14 1109 Last data filed at 12/18/14 0914  Gross per 24 hour  Intake    980 ml  Output   1150 ml  Net   -170 ml     Physical Exam  Awake Alert, Oriented X 3, No new F.N deficits, Normal affect [place].AT,PERRAL Supple Neck,+ JVD, No cervical lymphadenopathy appriciated.  Symmetrical Chest wall movement, Good air movement bilaterally, Irregular,No Gallops,Rubs, 3/6 Murmur,NO parasternal heave  +ve B.Sounds, Abd Soft, No tenderness, No organomegaly appriciated, No rebound - guarding or rigidity. No Cyanosis, Clubbing or edema, No new Rash or bruise     Data Review   Micro Results Recent Results (from the past 240 hour(s))  MRSA PCR Screening     Status: None   Collection Time: 12/12/14 10:00 AM  Result Value Ref Range Status   MRSA by PCR NEGATIVE NEGATIVE Final    Comment:        The GeneXpert MRSA Assay (FDA approved for NASAL specimens only), is one component of a comprehensive MRSA colonization surveillance program. It is not intended to diagnose MRSA infection nor to guide or monitor treatment for MRSA infections.     Radiology Reports Dg Chest Port 1 View  12/12/2014   CLINICAL DATA:  Shortness of breath. History of asthma, atrial fibrillation, nonsmoker. Respiratory distress starting 3 days ago with expiratory and inspiratory wheezing. Decreased oxygen saturations.  EXAM: PORTABLE CHEST - 1 VIEW  COMPARISON:  None.  FINDINGS: Mild cardiac  enlargement and pulmonary vascular congestion. Diffuse interstitial infiltrates throughout the lungs likely representing edema. No blunting of costophrenic angles. No pneumothorax. No focal consolidation. Calcified aorta.  IMPRESSION: Cardiac enlargement with pulmonary vascular congestion and diffuse interstitial pattern suggesting edema.   Electronically Signed   By: Lucienne Capers M.D.   On: 12/12/2014 04:29     CBC  Recent Labs Lab 12/12/14 0432  WBC 8.3  HGB 12.5  HCT 36.9  PLT 196  MCV 92.3  MCH 31.3  MCHC 33.9  RDW 12.5  LYMPHSABS 1.2  MONOABS 0.7  EOSABS 0.1  BASOSABS 0.0    Chemistries   Recent Labs Lab 12/14/14 0900 12/15/14 0345 12/16/14 0450 12/17/14  0430 12/17/14 1917 12/18/14 0247  NA  --  125* 123* 122* 122* 124*  K  --  4.3 3.9 4.1 4.2 4.2  CL  --  90* 89* 87* 86* 88*  CO2  --  26 26 25 26 27   GLUCOSE  --  127* 132* 115* 167* 128*  BUN  --  20 16 23* 26* 26*  CREATININE  --  0.83 0.76 0.89 1.03* 1.02*  CALCIUM  --  8.6* 8.3* 8.5* 8.5* 8.5*  MG 1.9  --   --   --   --   --    ------------------------------------------------------------------------------------------------------------------ estimated creatinine clearance is 33.4 mL/min (by C-G formula based on Cr of 1.02). ------------------------------------------------------------------------------------------------------------------ No results for input(s): HGBA1C in the last 72 hours. ------------------------------------------------------------------------------------------------------------------ No results for input(s): CHOL, HDL, LDLCALC, TRIG, CHOLHDL, LDLDIRECT in the last 72 hours. ------------------------------------------------------------------------------------------------------------------ No results for input(s): TSH, T4TOTAL, T3FREE, THYROIDAB in the last 72 hours.  Invalid input(s):  FREET3 ------------------------------------------------------------------------------------------------------------------ No results for input(s): VITAMINB12, FOLATE, FERRITIN, TIBC, IRON, RETICCTPCT in the last 72 hours.  Coagulation profile No results for input(s): INR, PROTIME in the last 168 hours.  No results for input(s): DDIMER in the last 72 hours.  Cardiac Enzymes  Recent Labs Lab 12/12/14 1014 12/12/14 1540 12/12/14 2200  TROPONINI 0.11* 0.10* 0.10*   ------------------------------------------------------------------------------------------------------------------ Invalid input(s): POCBNP     Time Spent in minutes   30 minutes   Faizah Kandler M.D on 12/18/2014 at 11:09 AM  Between 7am to 7pm - Pager - [phone]  After 7pm go to [email or web address] - password [email or web address]  [facility]   Office  [phone]

## 2014-12-19 LAB — RENAL FUNCTION PANEL
ALBUMIN: 3 g/dL — AB (ref 3.5–5.0)
ANION GAP: 8 (ref 5–15)
BUN: 27 mg/dL — ABNORMAL HIGH (ref 6–20)
CO2: 29 mmol/L (ref 22–32)
Calcium: 8.9 mg/dL (ref 8.9–10.3)
Chloride: 91 mmol/L — ABNORMAL LOW (ref 101–111)
Creatinine, Ser: 0.96 mg/dL (ref 0.44–1.00)
GFR calc Af Amer: >60 mL/min (ref >60)
GFR calc non Af Amer: 52 mL/min — ABNORMAL LOW (ref >60)
Glucose, Bld: 113 mg/dL — ABNORMAL HIGH (ref 65–99)
PHOSPHORUS: 4.8 mg/dL — AB (ref 2.5–4.6)
Potassium: 4.3 mmol/L (ref 3.5–5.1)
SODIUM: 128 mmol/L — AB (ref 135–145)

## 2014-12-19 MED ORDER — APIXABAN 2.5 MG PO TABS: 2.5000 mg | ORAL_TABLET | Freq: Two times a day (BID) | ORAL | Status: DC

## 2014-12-19 MED ORDER — DEMECLOCYCLINE HCL 150 MG PO TABS: 300.0000 mg | ORAL_TABLET | Freq: Two times a day (BID) | ORAL | Status: DC

## 2014-12-19 MED ORDER — AMLODIPINE BESYLATE 2.5 MG PO TABS: 2.5000 mg | ORAL_TABLET | Freq: Every day | ORAL | Status: DC

## 2014-12-19 MED ORDER — FUROSEMIDE 20 MG PO TABS
20.0000 mg | ORAL_TABLET | Freq: Every day | ORAL | Status: DC
Start: 2014-12-19 — End: 2015-01-24

## 2014-12-19 NOTE — Progress Notes (Signed)
Discharge to home, d/c instructions and follow up appointments done and was given to the daughter, verbalized understanding. PIV removed no s/s of infiltration or swelling noted.

## 2014-12-19 NOTE — Progress Notes (Signed)
Subjective:  No complaints- sodium up to 128 Objective Vital signs in last 24 hours: Filed Vitals:   12/18/14 1355 12/18/14 2114 12/19/14 0500 12/19/14 0536  BP: 116/67 105/70  111/73  Pulse: 93 67  62  Temp: 97.8 F (36.6 C) 97.3 F (36.3 C)  97.7 F (36.5 C)  TempSrc: Oral Oral  Oral  Resp: 18 18  20   Height:      Weight:   56.11 kg (123 lb 11.2 oz)   SpO2: 98% 98%  97%   Weight change: 0.91 kg (2 lb 0.1 oz)  Intake/Output Summary (Last 24 hours) at 12/19/14 1124 Last data filed at 12/19/14 0830  Gross per 24 hour  Intake    933 ml  Output   1700 ml  Net   -767 ml    Assessment/Plan: 81 year old WF with a history of hyponatremia now admitted with CHF- better with diuresis but then worsening of hyponatremia - has an inappropriately high urine osm  1) Hyponatremia- Initially needed to be diuresed and was diuresed appropriately- now seems euvolemic- however, the issue is that she was taking in water (hypoosmolar fluid) and she is putting out urine with an inappropriate high osm SIADH- is better with  lasix to 40 mg  demeclocycline -also  s/p two doses of tolvaptan-  Thenit should equilibrate it just sometimes takes a little while-I would discharge her on lasix 20 mg daily and demeclocycline  300 BID.  I just told her to drink what she is thirsty for and to not drink extra- I do not think is necessary to tell her she cannot have water.   I will arrange for labs to be done later this week as well as a follow up with me at the office  2) Hypertension- well controlled - I actually would have her to stop the norvasc at discharge- i decreased dose but BP if anything is low  Renal will sign off    Cyerra Yim A    Labs: Basic Metabolic Panel:  Recent Labs Lab 12/18/14 0247 12/18/14 0630 12/19/14 0507  NA 124* 127* 128*  K 4.2 4.3 4.3  CL 88* 88* 91*  CO2 27 27 29   GLUCOSE 128* 119* 113*  BUN 26* 24* 27*  CREATININE 1.02* 0.93 0.96  CALCIUM 8.5* 8.9 8.9  PHOS   --   --  4.8*   Liver Function Tests:  Recent Labs Lab 12/19/14 0507  ALBUMIN 3.0*   No results for input(s): LIPASE, AMYLASE in the last 168 hours. No results for input(s): AMMONIA in the last 168 hours. CBC: No results for input(s): WBC, NEUTROABS, HGB, HCT, MCV, PLT in the last 168 hours. Cardiac Enzymes:  Recent Labs Lab 12/12/14 1540 12/12/14 2200  TROPONINI 0.10* 0.10*   CBG: No results for input(s): GLUCAP in the last 168 hours.  Iron Studies: No results for input(s): IRON, TIBC, TRANSFERRIN, FERRITIN in the last 72 hours. Studies/Results: No results found. Medications: Infusions:    Scheduled Medications: . amLODipine  2.5 mg Oral Daily  . antiseptic oral rinse  7 mL Mouth Rinse BID  . apixaban  2.5 mg Oral BID  . demeclocycline  300 mg Oral Q12H  . furosemide  40 mg Oral Daily  . sodium chloride  3 mL Intravenous Q12H    have reviewed scheduled and prn medications.  Physical Exam: General: NAD, HOH Heart: irreg but good rate Lungs: clear Abdomen: soft, non tender Extremities: mild edema    12/19/2014,11:24 AM  LOS: 7 days

## 2014-12-19 NOTE — Discharge Summary (Signed)
Crystal Cooper, is a 81 y.o. female  DOB July 11, 1926  MRN [id number].  Admission date:  12/12/2014  Admitting Physician  Ivor Costa, MD  Discharge Date:  12/19/2014   Primary MD  Stephens Shire, MD  Recommendations for primary care physician for things to follow:  - Please check BMP during next visit, follow on sodium level, sodium is 128 at day of discharge. - Patient to follow with nephrology service Dr.Goldsborough  this coming week regarding hyponatremia. Patient is being discharged on Lasix 20 mg oral daily,and demeclocycline ( given prescription for 10 day supply until she is followed by nephrology) - Patient started on Eliquis for anticoagulation as she developed new onset A.flutter.  Admission Diagnosis  Shortness of breath [R06.02] Acute respiratory distress [J80] Acute congestive heart failure, unspecified congestive heart failure type [I50.9]   Discharge Diagnosis  Shortness of breath [R06.02] Acute respiratory distress [J80] Acute congestive heart failure, unspecified congestive heart failure type [I50.9]    Active Problems:   Hypertension   SVT (supraventricular tachycardia)   CHF (congestive heart failure)   Atrial flutter   Hyponatremia      Past Medical History  Diagnosis Date  . HTN (hypertension)   . Asthma   . Glaucoma   . Hearing loss   . Prolapsing mitral leaflet syndrome   . SVT (supraventricular tachycardia)     S/P ablation of AVNRT in 2003  . Aortic stenosis   . Atrial fibrillation     Past Surgical History  Procedure Laterality Date  . Appendectomy    . Bladder surgery    . Cardiac surgery    . Nasal sinus surgery         History of present illness and  Hospital Course:     Kindly see H&P for history of present illness and admission details, please review complete Labs, Consult reports and Test reports for all details in brief  HPI  from the history  and physical done on the day of admission  Crystal Cooper is a 81 y.o. female, with past medical history of diastolic CHF, SVT, asthmatic bronchitis, vertigo stenosis, hypertension, presents with complaints of shortness of breath, patient had recent echo in May 2016 showing grade 2 diastolic dysfunction, EF [id number], agent called her daughter 3 AM secondary to difficulty breathing, found to be in respiratory distress, interacting 89% on room air, ABG showing no hypoxia or hypercarbia, chest x-ray significant for vascular congestion, has elevated BNP, patient started on BiPAP, received IV Lasix, had significant improvement of her symptoms, reports some is pressure in left chest during shortness of breath, patient is hard of hearing, usually wears hearing aid which she does not have right now, she was obtained from daughter.  Hospital Course  81 y.o. female, with history of diastolic CHF, SVT, asthmatic bronchitis, aortic stenosis, hypertension, presents with shortness of breath, patient , echo in May 2016 showing grade 2 diastolic dysfunction, EF [id number], initially requiring BiPAP, significant improvement with IV diuresis. Patient developed a flutter on 12/13/14, rate controlled,  started on anticoagulation, patient hospital stay was prolonged secondary to worsening hyponatremia.   Hyponatremia - workup significant for elevated urine sodium , urine osmolality of 482 , serum osmolality 269 , SIADH Despite being on strict fluid restriction she continues to have worsening hyponatremia initially, sodium is 128 and day of discharge, lowest was at 122 during hospital stay. - Nephrology consult greatly appreciated, surgeon started to improve after patient received couple doses of tolvaptan, started on demeclocycline , and to continue with fluid restriction on discharge, to follow with nephrology this coming week as an outpatient   Acute diastolic CHF - 2-D echo on 5/16 showing EF 55%, with a grade 2 diastolic  dysfunction. - Initially on IV Lasix 40 mg twice a day, strict ins and outs, ,daily weight, currently appears to be euvolemic,  Chest pain - During dyspnea, is likely related to CHF, continue to monitor on telemetry, no recurrence. - Troponin has plateaued 0.11>> 0.10>> 0.10 - Discussed with cardiology, normal stress test 12/2011, this is most likely related to demand ischemia, not a candidate for any further testing.  Atrial flutter - Cardiology consult appreciated,CHA2DS2 VASc score of 5 , rate controlled, started on Eliquis, will need 30 days monitor as an outpatient, to be arranged by Dr. Stanford Breed as an outpatient.    Hypertension - Continue with Norvasc.  Hypokelmia - Repleted, magnesium within normal limits  History of SVT - Monitor on telemetry  Discharge Condition: stable   Follow UP  Follow-up Information    Follow up with [facility].   Why:  HHRN/PT/OT   Contact information:   [address] [place] [address] [phone]       Follow up with Tarri Fuller, PA-C On 01/24/2015.   Specialties:  Physician Assistant, Radiology, Interventional Cardiology   Why:  11:30 am (Dr. Jacalyn Lefevre PA).    Contact information:   [address] [place] [address] [phone]       Follow up with [facility].   Specialty:  Cardiology   Why:  our office will call you with an appointment for a 30 day heart monitor   Contact information:   [id number] [place] [phone]      Follow up with BURNETT,BRENT A, MD. Schedule an appointment as soon as possible for a visit in 1 week.   Specialty:  Family Medicine   Why:  Posthospitalization follow-up   Contact information:   [place] [address] [phone]         Discharge Instructions  and  Discharge Medications    Discharge Instructions    Diet - low  sodium heart healthy    Complete by:  As directed      Discharge instructions    Complete by:  As directed   Follow with Primary MD BURNETT,BRENT A, MD in 7 days   Get CBC, CMP, 2 view Chest X ray checked  by Primary MD next visit.    Activity: As tolerated with Full fall precautions use walker/cane & assistance as needed   Disposition Home    Diet: Heart Healthy , with fluid restriction 1200 mL per day, with feeding assistance and aspiration precautions.  For Heart failure patients - Check your Weight same time everyday, if you gain over 2 pounds, or you develop in leg swelling, experience more shortness of breath or chest pain, call your Primary MD immediately.  Follow Cardiac Low Salt Diet and 1.5 lit/day fluid restriction.   On your next visit with your primary care physician please Get Medicines reviewed and adjusted.   Please request your Prim.MD to go over all Hospital Tests and Procedure/Radiological results at the follow up, please get all Hospital records sent to your Prim MD by signing hospital release before you go home.   If you experience worsening of your admission symptoms, develop shortness of breath, life threatening emergency, suicidal or homicidal thoughts you must seek medical attention immediately by calling 911 or calling your MD immediately  if symptoms less severe.  You Must read complete instructions/literature along with all the possible adverse reactions/side effects for all the Medicines you take and that have been prescribed to you. Take any new Medicines after you have completely understood and accpet all the possible adverse reactions/side effects.   Do not drive, operating heavy machinery, perform activities at heights, swimming or participation in water activities or provide baby sitting services if your were admitted for syncope or siezures until you have seen by Primary MD or a Neurologist and advised to do so again.  Do not drive when taking Pain  medications.    Do not take more than prescribed Pain, Sleep and Anxiety Medications  Special Instructions: If you have smoked or chewed Tobacco  in the last 2 yrs please stop smoking, stop any regular Alcohol  and or any Recreational drug use.  Wear Seat belts while driving.   Please note  You were cared for by a hospitalist during your hospital stay. If you have any questions about your discharge medications or the care you received while you were in the hospital after you are discharged, you can call the unit and asked to speak with the hospitalist on call if the hospitalist that took care of you is not available. Once you are discharged, your primary care physician will handle any further medical issues. Please note that NO REFILLS for any discharge medications will be authorized once you are discharged, as it is imperative that you return to your primary care physician (or establish a relationship with a primary care physician if you do not have one) for your aftercare needs so that they can reassess your need for medications and monitor your lab values.     Increase activity slowly    Complete by:  As directed             Medication List    TAKE these medications        albuterol 108 (90 BASE) MCG/ACT inhaler  Commonly known as:  PROVENTIL HFA;VENTOLIN HFA  Inhale 2 puffs into the lungs every 4 (four) hours as needed.     amLODipine 2.5 MG tablet  Commonly known as:  NORVASC  Take 1 tablet (2.5 mg total) by mouth daily.     apixaban 2.5 MG Tabs tablet  Commonly known as:  ELIQUIS  Take 1 tablet (2.5 mg total) by mouth 2 (two) times daily.     BIOTIN FORTE PO  Take by mouth daily.     demeclocycline 150 MG tablet  Commonly known as:  DECLOMYCIN  Take 2 tablets (300 mg total) by mouth every 12 (twelve) hours.     furosemide 20 MG tablet  Commonly known as:  LASIX  Take 1 tablet (20 mg total) by mouth daily.     MULTIPLE VITAMIN PO  Take by mouth.           Diet  and Activity recommendation: See Discharge Instructions above   Consults obtained -  Cardiology Nephrology   Major procedures and Radiology Reports - PLEASE review detailed and final reports for all details, in brief -     Dg Chest Port 1 View  12/12/2014   CLINICAL DATA:  Shortness of breath. History of asthma, atrial fibrillation, nonsmoker. Respiratory distress starting 3 days ago with expiratory and inspiratory wheezing. Decreased oxygen saturations.  EXAM: PORTABLE CHEST - 1 VIEW  COMPARISON:  None.  FINDINGS: Mild cardiac enlargement and pulmonary vascular congestion. Diffuse interstitial infiltrates throughout the lungs likely representing edema. No blunting of costophrenic angles. No pneumothorax. No focal consolidation. Calcified aorta.  IMPRESSION: Cardiac enlargement with pulmonary vascular congestion and diffuse interstitial pattern suggesting edema.   Electronically Signed   By: Lucienne Capers M.D.   On: 12/12/2014 04:29    Micro Results   Recent Results (from the past 240 hour(s))  MRSA PCR Screening     Status: None   Collection Time: 12/12/14 10:00 AM  Result Value Ref Range Status   MRSA by PCR NEGATIVE NEGATIVE Final    Comment:        The GeneXpert MRSA Assay (FDA approved for NASAL specimens only), is one component of a comprehensive MRSA colonization surveillance program. It is not intended to diagnose MRSA infection nor to guide or monitor treatment for MRSA infections.        Today   Subjective:   Crystal Cooper today has no headache,no chest abdominal pain,no new weakness tingling or numbness, feels much better wants to go home today.   Objective:   Blood pressure 111/73, pulse 62, temperature 97.7 F (36.5 C), temperature source Oral, resp. rate 20, height 5\' 3"  (1.6 m), weight 56.11 kg (123 lb 11.2 oz), SpO2 97 %.   Intake/Output Summary (Last 24 hours) at 12/19/14 1207 Last data filed at 12/19/14 0830  Gross per 24 hour   Intake    933 ml  Output   1700 ml  Net   -767 ml    Exam Awake Alert, Oriented x 3, No new F.N deficits, Normal affect [place].AT,PERRAL Supple Neck,No JVD, No cervical lymphadenopathy appriciated.  Symmetrical Chest wall movement, Good air movement bilaterally, CTAB ,No Gallops,Rubs or new Murmurs, No Parasternal Heave +ve B.Sounds, Abd Soft, Non tender, No organomegaly appriciated, No rebound -guarding or rigidity. No Cyanosis, Clubbing or edema, No new Rash or bruise  Data Review   CBC w Diff: Lab Results  Component Value Date   WBC 8.3 12/12/2014   HGB 12.5 12/12/2014   HCT 36.9 12/12/2014   PLT 196 12/12/2014   LYMPHOPCT 15 12/12/2014   MONOPCT 8 12/12/2014   EOSPCT 1 12/12/2014   BASOPCT 1 12/12/2014    CMP: Lab Results  Component Value Date   NA 128* 12/19/2014   K 4.3 12/19/2014   CL 91* 12/19/2014   CO2 29 12/19/2014   BUN 27* 12/19/2014   CREATININE 0.96 12/19/2014   ALBUMIN 3.0* 12/19/2014  .   Total Time in preparing paper work, data evaluation and todays exam - 35 minutes  ELGERGAWY, DAWOOD M.D on 12/19/2014 at 12:07 PM  [facility]   Office  [phone]

## 2014-12-21 DIAGNOSIS — I350 Nonrheumatic aortic (valve) stenosis: Secondary | ICD-10-CM | POA: Diagnosis not present

## 2014-12-21 DIAGNOSIS — I4891 Unspecified atrial fibrillation: Secondary | ICD-10-CM | POA: Diagnosis not present

## 2014-12-21 DIAGNOSIS — Z7901 Long term (current) use of anticoagulants: Secondary | ICD-10-CM | POA: Diagnosis not present

## 2014-12-21 DIAGNOSIS — I4892 Unspecified atrial flutter: Secondary | ICD-10-CM | POA: Diagnosis not present

## 2014-12-21 DIAGNOSIS — I5033 Acute on chronic diastolic (congestive) heart failure: Secondary | ICD-10-CM | POA: Diagnosis not present

## 2014-12-21 DIAGNOSIS — I10 Essential (primary) hypertension: Secondary | ICD-10-CM | POA: Diagnosis not present

## 2014-12-22 DIAGNOSIS — I4892 Unspecified atrial flutter: Secondary | ICD-10-CM | POA: Diagnosis not present

## 2014-12-22 DIAGNOSIS — I10 Essential (primary) hypertension: Secondary | ICD-10-CM | POA: Diagnosis not present

## 2014-12-22 DIAGNOSIS — I4891 Unspecified atrial fibrillation: Secondary | ICD-10-CM | POA: Diagnosis not present

## 2014-12-22 DIAGNOSIS — Z7901 Long term (current) use of anticoagulants: Secondary | ICD-10-CM | POA: Diagnosis not present

## 2014-12-22 DIAGNOSIS — I350 Nonrheumatic aortic (valve) stenosis: Secondary | ICD-10-CM | POA: Diagnosis not present

## 2014-12-22 DIAGNOSIS — I5033 Acute on chronic diastolic (congestive) heart failure: Secondary | ICD-10-CM | POA: Diagnosis not present

## 2014-12-23 ENCOUNTER — Ambulatory Visit (INDEPENDENT_AMBULATORY_CARE_PROVIDER_SITE_OTHER): Payer: Medicare Other

## 2014-12-23 ENCOUNTER — Other Ambulatory Visit: Payer: Self-pay | Admitting: Cardiology

## 2014-12-23 DIAGNOSIS — R001 Bradycardia, unspecified: Secondary | ICD-10-CM

## 2014-12-23 DIAGNOSIS — I4892 Unspecified atrial flutter: Secondary | ICD-10-CM

## 2014-12-23 DIAGNOSIS — I471 Supraventricular tachycardia: Secondary | ICD-10-CM

## 2014-12-23 DIAGNOSIS — E871 Hypo-osmolality and hyponatremia: Secondary | ICD-10-CM | POA: Diagnosis not present

## 2014-12-24 DIAGNOSIS — I5033 Acute on chronic diastolic (congestive) heart failure: Secondary | ICD-10-CM | POA: Diagnosis not present

## 2014-12-24 DIAGNOSIS — I350 Nonrheumatic aortic (valve) stenosis: Secondary | ICD-10-CM | POA: Diagnosis not present

## 2014-12-24 DIAGNOSIS — I4892 Unspecified atrial flutter: Secondary | ICD-10-CM | POA: Diagnosis not present

## 2014-12-24 DIAGNOSIS — I10 Essential (primary) hypertension: Secondary | ICD-10-CM | POA: Diagnosis not present

## 2014-12-24 DIAGNOSIS — I4891 Unspecified atrial fibrillation: Secondary | ICD-10-CM | POA: Diagnosis not present

## 2014-12-24 DIAGNOSIS — Z7901 Long term (current) use of anticoagulants: Secondary | ICD-10-CM | POA: Diagnosis not present

## 2014-12-25 DIAGNOSIS — Z7901 Long term (current) use of anticoagulants: Secondary | ICD-10-CM | POA: Diagnosis not present

## 2014-12-25 DIAGNOSIS — I10 Essential (primary) hypertension: Secondary | ICD-10-CM | POA: Diagnosis not present

## 2014-12-25 DIAGNOSIS — I350 Nonrheumatic aortic (valve) stenosis: Secondary | ICD-10-CM | POA: Diagnosis not present

## 2014-12-25 DIAGNOSIS — I5033 Acute on chronic diastolic (congestive) heart failure: Secondary | ICD-10-CM | POA: Diagnosis not present

## 2014-12-25 DIAGNOSIS — I4892 Unspecified atrial flutter: Secondary | ICD-10-CM | POA: Diagnosis not present

## 2014-12-25 DIAGNOSIS — I4891 Unspecified atrial fibrillation: Secondary | ICD-10-CM | POA: Diagnosis not present

## 2014-12-27 DIAGNOSIS — I350 Nonrheumatic aortic (valve) stenosis: Secondary | ICD-10-CM | POA: Diagnosis not present

## 2014-12-27 DIAGNOSIS — I10 Essential (primary) hypertension: Secondary | ICD-10-CM | POA: Diagnosis not present

## 2014-12-27 DIAGNOSIS — I4891 Unspecified atrial fibrillation: Secondary | ICD-10-CM | POA: Diagnosis not present

## 2014-12-27 DIAGNOSIS — Z7901 Long term (current) use of anticoagulants: Secondary | ICD-10-CM | POA: Diagnosis not present

## 2014-12-27 DIAGNOSIS — I4892 Unspecified atrial flutter: Secondary | ICD-10-CM | POA: Diagnosis not present

## 2014-12-27 DIAGNOSIS — I5033 Acute on chronic diastolic (congestive) heart failure: Secondary | ICD-10-CM | POA: Diagnosis not present

## 2014-12-28 DIAGNOSIS — E871 Hypo-osmolality and hyponatremia: Secondary | ICD-10-CM | POA: Diagnosis not present

## 2014-12-29 DIAGNOSIS — I5033 Acute on chronic diastolic (congestive) heart failure: Secondary | ICD-10-CM | POA: Diagnosis not present

## 2014-12-29 DIAGNOSIS — I4891 Unspecified atrial fibrillation: Secondary | ICD-10-CM | POA: Diagnosis not present

## 2014-12-29 DIAGNOSIS — Z7901 Long term (current) use of anticoagulants: Secondary | ICD-10-CM | POA: Diagnosis not present

## 2014-12-29 DIAGNOSIS — I10 Essential (primary) hypertension: Secondary | ICD-10-CM | POA: Diagnosis not present

## 2014-12-29 DIAGNOSIS — I4892 Unspecified atrial flutter: Secondary | ICD-10-CM | POA: Diagnosis not present

## 2014-12-29 DIAGNOSIS — I350 Nonrheumatic aortic (valve) stenosis: Secondary | ICD-10-CM | POA: Diagnosis not present

## 2014-12-30 DIAGNOSIS — Z7901 Long term (current) use of anticoagulants: Secondary | ICD-10-CM | POA: Diagnosis not present

## 2014-12-30 DIAGNOSIS — I4892 Unspecified atrial flutter: Secondary | ICD-10-CM | POA: Diagnosis not present

## 2014-12-30 DIAGNOSIS — I10 Essential (primary) hypertension: Secondary | ICD-10-CM | POA: Diagnosis not present

## 2014-12-30 DIAGNOSIS — I350 Nonrheumatic aortic (valve) stenosis: Secondary | ICD-10-CM | POA: Diagnosis not present

## 2014-12-30 DIAGNOSIS — I4891 Unspecified atrial fibrillation: Secondary | ICD-10-CM | POA: Diagnosis not present

## 2014-12-30 DIAGNOSIS — I5033 Acute on chronic diastolic (congestive) heart failure: Secondary | ICD-10-CM | POA: Diagnosis not present

## 2014-12-31 DIAGNOSIS — Z7901 Long term (current) use of anticoagulants: Secondary | ICD-10-CM | POA: Diagnosis not present

## 2014-12-31 DIAGNOSIS — I350 Nonrheumatic aortic (valve) stenosis: Secondary | ICD-10-CM | POA: Diagnosis not present

## 2014-12-31 DIAGNOSIS — I5033 Acute on chronic diastolic (congestive) heart failure: Secondary | ICD-10-CM | POA: Diagnosis not present

## 2014-12-31 DIAGNOSIS — I10 Essential (primary) hypertension: Secondary | ICD-10-CM | POA: Diagnosis not present

## 2014-12-31 DIAGNOSIS — I4892 Unspecified atrial flutter: Secondary | ICD-10-CM | POA: Diagnosis not present

## 2014-12-31 DIAGNOSIS — I4891 Unspecified atrial fibrillation: Secondary | ICD-10-CM | POA: Diagnosis not present

## 2015-01-03 DIAGNOSIS — Z7901 Long term (current) use of anticoagulants: Secondary | ICD-10-CM | POA: Diagnosis not present

## 2015-01-03 DIAGNOSIS — I5033 Acute on chronic diastolic (congestive) heart failure: Secondary | ICD-10-CM | POA: Diagnosis not present

## 2015-01-03 DIAGNOSIS — I4891 Unspecified atrial fibrillation: Secondary | ICD-10-CM | POA: Diagnosis not present

## 2015-01-03 DIAGNOSIS — I350 Nonrheumatic aortic (valve) stenosis: Secondary | ICD-10-CM | POA: Diagnosis not present

## 2015-01-03 DIAGNOSIS — I10 Essential (primary) hypertension: Secondary | ICD-10-CM | POA: Diagnosis not present

## 2015-01-03 DIAGNOSIS — I4892 Unspecified atrial flutter: Secondary | ICD-10-CM | POA: Diagnosis not present

## 2015-01-04 DIAGNOSIS — I5033 Acute on chronic diastolic (congestive) heart failure: Secondary | ICD-10-CM | POA: Diagnosis not present

## 2015-01-04 DIAGNOSIS — I10 Essential (primary) hypertension: Secondary | ICD-10-CM | POA: Diagnosis not present

## 2015-01-04 DIAGNOSIS — I4892 Unspecified atrial flutter: Secondary | ICD-10-CM | POA: Diagnosis not present

## 2015-01-04 DIAGNOSIS — I4891 Unspecified atrial fibrillation: Secondary | ICD-10-CM | POA: Diagnosis not present

## 2015-01-04 DIAGNOSIS — Z7901 Long term (current) use of anticoagulants: Secondary | ICD-10-CM | POA: Diagnosis not present

## 2015-01-04 DIAGNOSIS — E871 Hypo-osmolality and hyponatremia: Secondary | ICD-10-CM | POA: Diagnosis not present

## 2015-01-04 DIAGNOSIS — I350 Nonrheumatic aortic (valve) stenosis: Secondary | ICD-10-CM | POA: Diagnosis not present

## 2015-01-05 DIAGNOSIS — I4891 Unspecified atrial fibrillation: Secondary | ICD-10-CM | POA: Diagnosis not present

## 2015-01-05 DIAGNOSIS — I4892 Unspecified atrial flutter: Secondary | ICD-10-CM | POA: Diagnosis not present

## 2015-01-05 DIAGNOSIS — I350 Nonrheumatic aortic (valve) stenosis: Secondary | ICD-10-CM | POA: Diagnosis not present

## 2015-01-05 DIAGNOSIS — I10 Essential (primary) hypertension: Secondary | ICD-10-CM | POA: Diagnosis not present

## 2015-01-05 DIAGNOSIS — Z7901 Long term (current) use of anticoagulants: Secondary | ICD-10-CM | POA: Diagnosis not present

## 2015-01-05 DIAGNOSIS — I5033 Acute on chronic diastolic (congestive) heart failure: Secondary | ICD-10-CM | POA: Diagnosis not present

## 2015-01-06 DIAGNOSIS — I4891 Unspecified atrial fibrillation: Secondary | ICD-10-CM | POA: Diagnosis not present

## 2015-01-06 DIAGNOSIS — I350 Nonrheumatic aortic (valve) stenosis: Secondary | ICD-10-CM | POA: Diagnosis not present

## 2015-01-06 DIAGNOSIS — I5033 Acute on chronic diastolic (congestive) heart failure: Secondary | ICD-10-CM | POA: Diagnosis not present

## 2015-01-06 DIAGNOSIS — I10 Essential (primary) hypertension: Secondary | ICD-10-CM | POA: Diagnosis not present

## 2015-01-06 DIAGNOSIS — I4892 Unspecified atrial flutter: Secondary | ICD-10-CM | POA: Diagnosis not present

## 2015-01-06 DIAGNOSIS — Z7901 Long term (current) use of anticoagulants: Secondary | ICD-10-CM | POA: Diagnosis not present

## 2015-01-07 DIAGNOSIS — I4891 Unspecified atrial fibrillation: Secondary | ICD-10-CM | POA: Diagnosis not present

## 2015-01-07 DIAGNOSIS — Z7901 Long term (current) use of anticoagulants: Secondary | ICD-10-CM | POA: Diagnosis not present

## 2015-01-07 DIAGNOSIS — I10 Essential (primary) hypertension: Secondary | ICD-10-CM | POA: Diagnosis not present

## 2015-01-07 DIAGNOSIS — I4892 Unspecified atrial flutter: Secondary | ICD-10-CM | POA: Diagnosis not present

## 2015-01-07 DIAGNOSIS — I5033 Acute on chronic diastolic (congestive) heart failure: Secondary | ICD-10-CM | POA: Diagnosis not present

## 2015-01-07 DIAGNOSIS — I350 Nonrheumatic aortic (valve) stenosis: Secondary | ICD-10-CM | POA: Diagnosis not present

## 2015-01-10 DIAGNOSIS — I350 Nonrheumatic aortic (valve) stenosis: Secondary | ICD-10-CM | POA: Diagnosis not present

## 2015-01-10 DIAGNOSIS — I509 Heart failure, unspecified: Secondary | ICD-10-CM | POA: Diagnosis not present

## 2015-01-10 DIAGNOSIS — I120 Hypertensive chronic kidney disease with stage 5 chronic kidney disease or end stage renal disease: Secondary | ICD-10-CM | POA: Diagnosis not present

## 2015-01-10 DIAGNOSIS — I5033 Acute on chronic diastolic (congestive) heart failure: Secondary | ICD-10-CM | POA: Diagnosis not present

## 2015-01-10 DIAGNOSIS — I10 Essential (primary) hypertension: Secondary | ICD-10-CM | POA: Diagnosis not present

## 2015-01-10 DIAGNOSIS — I4892 Unspecified atrial flutter: Secondary | ICD-10-CM | POA: Diagnosis not present

## 2015-01-10 DIAGNOSIS — Z7901 Long term (current) use of anticoagulants: Secondary | ICD-10-CM | POA: Diagnosis not present

## 2015-01-10 DIAGNOSIS — I4891 Unspecified atrial fibrillation: Secondary | ICD-10-CM | POA: Diagnosis not present

## 2015-01-10 DIAGNOSIS — E871 Hypo-osmolality and hyponatremia: Secondary | ICD-10-CM | POA: Diagnosis not present

## 2015-01-11 DIAGNOSIS — Z7901 Long term (current) use of anticoagulants: Secondary | ICD-10-CM | POA: Diagnosis not present

## 2015-01-11 DIAGNOSIS — I350 Nonrheumatic aortic (valve) stenosis: Secondary | ICD-10-CM | POA: Diagnosis not present

## 2015-01-11 DIAGNOSIS — I4891 Unspecified atrial fibrillation: Secondary | ICD-10-CM | POA: Diagnosis not present

## 2015-01-11 DIAGNOSIS — I10 Essential (primary) hypertension: Secondary | ICD-10-CM | POA: Diagnosis not present

## 2015-01-11 DIAGNOSIS — I5033 Acute on chronic diastolic (congestive) heart failure: Secondary | ICD-10-CM | POA: Diagnosis not present

## 2015-01-11 DIAGNOSIS — I4892 Unspecified atrial flutter: Secondary | ICD-10-CM | POA: Diagnosis not present

## 2015-01-12 ENCOUNTER — Telehealth: Payer: Self-pay | Admitting: Physician Assistant

## 2015-01-12 DIAGNOSIS — I4891 Unspecified atrial fibrillation: Secondary | ICD-10-CM | POA: Diagnosis not present

## 2015-01-12 DIAGNOSIS — I10 Essential (primary) hypertension: Secondary | ICD-10-CM | POA: Diagnosis not present

## 2015-01-12 DIAGNOSIS — I350 Nonrheumatic aortic (valve) stenosis: Secondary | ICD-10-CM | POA: Diagnosis not present

## 2015-01-12 DIAGNOSIS — I5033 Acute on chronic diastolic (congestive) heart failure: Secondary | ICD-10-CM | POA: Diagnosis not present

## 2015-01-12 DIAGNOSIS — I4892 Unspecified atrial flutter: Secondary | ICD-10-CM | POA: Diagnosis not present

## 2015-01-12 DIAGNOSIS — Z7901 Long term (current) use of anticoagulants: Secondary | ICD-10-CM | POA: Diagnosis not present

## 2015-01-12 NOTE — Telephone Encounter (Signed)
Received records from [facility] for appointment with Tarri Fuller, PA on 01/24/15.  Records given to D Chavis (medical records) for Bryan's schedule on 01/24/15. lp

## 2015-01-13 DIAGNOSIS — I5033 Acute on chronic diastolic (congestive) heart failure: Secondary | ICD-10-CM | POA: Diagnosis not present

## 2015-01-13 DIAGNOSIS — I10 Essential (primary) hypertension: Secondary | ICD-10-CM | POA: Diagnosis not present

## 2015-01-13 DIAGNOSIS — I4891 Unspecified atrial fibrillation: Secondary | ICD-10-CM | POA: Diagnosis not present

## 2015-01-13 DIAGNOSIS — Z7901 Long term (current) use of anticoagulants: Secondary | ICD-10-CM | POA: Diagnosis not present

## 2015-01-13 DIAGNOSIS — I4892 Unspecified atrial flutter: Secondary | ICD-10-CM | POA: Diagnosis not present

## 2015-01-13 DIAGNOSIS — I350 Nonrheumatic aortic (valve) stenosis: Secondary | ICD-10-CM | POA: Diagnosis not present

## 2015-01-14 DIAGNOSIS — I10 Essential (primary) hypertension: Secondary | ICD-10-CM | POA: Diagnosis not present

## 2015-01-14 DIAGNOSIS — I350 Nonrheumatic aortic (valve) stenosis: Secondary | ICD-10-CM | POA: Diagnosis not present

## 2015-01-14 DIAGNOSIS — Z7901 Long term (current) use of anticoagulants: Secondary | ICD-10-CM | POA: Diagnosis not present

## 2015-01-14 DIAGNOSIS — I4891 Unspecified atrial fibrillation: Secondary | ICD-10-CM | POA: Diagnosis not present

## 2015-01-14 DIAGNOSIS — I4892 Unspecified atrial flutter: Secondary | ICD-10-CM | POA: Diagnosis not present

## 2015-01-14 DIAGNOSIS — I5033 Acute on chronic diastolic (congestive) heart failure: Secondary | ICD-10-CM | POA: Diagnosis not present

## 2015-01-16 DIAGNOSIS — I10 Essential (primary) hypertension: Secondary | ICD-10-CM | POA: Diagnosis not present

## 2015-01-16 DIAGNOSIS — Z7901 Long term (current) use of anticoagulants: Secondary | ICD-10-CM | POA: Diagnosis not present

## 2015-01-16 DIAGNOSIS — I4892 Unspecified atrial flutter: Secondary | ICD-10-CM | POA: Diagnosis not present

## 2015-01-16 DIAGNOSIS — I5033 Acute on chronic diastolic (congestive) heart failure: Secondary | ICD-10-CM | POA: Diagnosis not present

## 2015-01-16 DIAGNOSIS — I4891 Unspecified atrial fibrillation: Secondary | ICD-10-CM | POA: Diagnosis not present

## 2015-01-16 DIAGNOSIS — I350 Nonrheumatic aortic (valve) stenosis: Secondary | ICD-10-CM | POA: Diagnosis not present

## 2015-01-17 DIAGNOSIS — E871 Hypo-osmolality and hyponatremia: Secondary | ICD-10-CM | POA: Diagnosis not present

## 2015-01-17 DIAGNOSIS — I509 Heart failure, unspecified: Secondary | ICD-10-CM | POA: Diagnosis not present

## 2015-01-17 DIAGNOSIS — I350 Nonrheumatic aortic (valve) stenosis: Secondary | ICD-10-CM | POA: Diagnosis not present

## 2015-01-17 DIAGNOSIS — I10 Essential (primary) hypertension: Secondary | ICD-10-CM | POA: Diagnosis not present

## 2015-01-18 DIAGNOSIS — I350 Nonrheumatic aortic (valve) stenosis: Secondary | ICD-10-CM | POA: Diagnosis not present

## 2015-01-18 DIAGNOSIS — I10 Essential (primary) hypertension: Secondary | ICD-10-CM | POA: Diagnosis not present

## 2015-01-18 DIAGNOSIS — I4891 Unspecified atrial fibrillation: Secondary | ICD-10-CM | POA: Diagnosis not present

## 2015-01-18 DIAGNOSIS — I4892 Unspecified atrial flutter: Secondary | ICD-10-CM | POA: Diagnosis not present

## 2015-01-18 DIAGNOSIS — Z7901 Long term (current) use of anticoagulants: Secondary | ICD-10-CM | POA: Diagnosis not present

## 2015-01-18 DIAGNOSIS — I5033 Acute on chronic diastolic (congestive) heart failure: Secondary | ICD-10-CM | POA: Diagnosis not present

## 2015-01-20 DIAGNOSIS — I4891 Unspecified atrial fibrillation: Secondary | ICD-10-CM | POA: Diagnosis not present

## 2015-01-20 DIAGNOSIS — I4892 Unspecified atrial flutter: Secondary | ICD-10-CM | POA: Diagnosis not present

## 2015-01-20 DIAGNOSIS — I350 Nonrheumatic aortic (valve) stenosis: Secondary | ICD-10-CM | POA: Diagnosis not present

## 2015-01-20 DIAGNOSIS — Z7901 Long term (current) use of anticoagulants: Secondary | ICD-10-CM | POA: Diagnosis not present

## 2015-01-20 DIAGNOSIS — I5033 Acute on chronic diastolic (congestive) heart failure: Secondary | ICD-10-CM | POA: Diagnosis not present

## 2015-01-20 DIAGNOSIS — I10 Essential (primary) hypertension: Secondary | ICD-10-CM | POA: Diagnosis not present

## 2015-01-21 DIAGNOSIS — E876 Hypokalemia: Secondary | ICD-10-CM | POA: Diagnosis not present

## 2015-01-24 ENCOUNTER — Ambulatory Visit (INDEPENDENT_AMBULATORY_CARE_PROVIDER_SITE_OTHER): Payer: Medicare Other | Admitting: Physician Assistant

## 2015-01-24 ENCOUNTER — Encounter: Payer: Self-pay | Admitting: Physician Assistant

## 2015-01-24 VITALS — BP 162/72 | HR 74 | Ht 63.0 in | Wt 119.5 lb

## 2015-01-24 DIAGNOSIS — I471 Supraventricular tachycardia: Secondary | ICD-10-CM

## 2015-01-24 DIAGNOSIS — I350 Nonrheumatic aortic (valve) stenosis: Secondary | ICD-10-CM | POA: Diagnosis not present

## 2015-01-24 DIAGNOSIS — I10 Essential (primary) hypertension: Secondary | ICD-10-CM | POA: Diagnosis not present

## 2015-01-24 DIAGNOSIS — I483 Typical atrial flutter: Secondary | ICD-10-CM

## 2015-01-24 DIAGNOSIS — I5032 Chronic diastolic (congestive) heart failure: Secondary | ICD-10-CM

## 2015-01-24 NOTE — Assessment & Plan Note (Signed)
No SVT seen on 30 day heart monitor

## 2015-01-24 NOTE — Progress Notes (Signed)
Patient ID: Crystal Cooper, female   DOB: [birth year], 81 y.o.   MRN: [id number]    Date:  01/24/2015   ID:  Crystal Cooper, DOB 1927/01/03, MRN [id number]  PCP:  Stephens Shire, MD  Primary Cardiologist:  Stanford Breed   Chief Complaint  Patient presents with  . Follow-up    30-day event monitor  . Shortness of Breath    on exertion  . Edema    in both ankles     History of Present Illness: Crystal Cooper is a 81 y.o. female followed by Dr. Stanford Breed, with a h/o SVT s/p AVNRT ablation in 2003, aortic stenosis, atrial fibrillation, HTN and chronic diastolic CHF with grade 2 DD on recent echo 10/2014. EF is normal at 60-65%. Her AS is moderate-severe. Recent 2 echo demonstrated an AVA of 0.73 cm2 and mean gradient of 31 mg Hg.  She was admitted to [facility] by IM, 12/12/14, for acute on chronic diastolic CHF. She has had a good response to IV diuretic therapy and dyspnea has improved. I/Os negative 4.9 L. She is well below her dry office weight of 124 lb. Hospital weight is at 120 lb. Cardiology was consulted for atrial flutter with a controlled and, at times, a slow ventricular response. Troponin's were mildly elevated with flat trend (0.11, 0.10, 0.10), likely secondary to demand ischemia. Renal function is normal. CHA2DS2 VASc score is 5. (CHF, HTN, Age> 22 (2), Female Sex). She was started on Eliquis 2.5 mg twice daily.    Patient presents for posthospital follow-up. She wore a CardioNet monitor for 30 days. Her heart rate ranged from 47-117. The overall trend is very well controlled. Approximately last 10 days. That her overall burden per day was increased compared to the first 20 days. She reports that she feels "pretty good". She does notice that her heart rate increases with some exertion she sometimes feels short of breath. She gets lower extremity edema and was instructed to keep her legs elevated as much as possible. She sleeps on one pillow and denies any PND. Her Lasix was recently increased to  40 mg daily by Dr. Moshe Cipro.    The patient currently denies nausea, vomiting, fever, chest pain, orthopnea, dizziness, PND, cough, congestion, abdominal pain, hematochezia, melena, claudication.  Wt Readings from Last 3 Encounters:  01/24/15 119 lb 8 oz (54.205 kg)  12/19/14 123 lb 11.2 oz (56.11 kg)  05/25/14 124 lb 1.6 oz (56.291 kg)     Past Medical History  Diagnosis Date  . HTN (hypertension)   . Asthma   . Glaucoma   . Hearing loss   . Prolapsing mitral leaflet syndrome   . SVT (supraventricular tachycardia)     S/P ablation of AVNRT in 2003  . Aortic stenosis   . Atrial fibrillation     Current Outpatient Prescriptions  Medication Sig Dispense Refill  . albuterol (PROVENTIL HFA;VENTOLIN HFA) 108 (90 BASE) MCG/ACT inhaler Inhale 2 puffs into the lungs every 4 (four) hours as needed.    [name] amLODipine (NORVASC) 2.5 MG tablet Take 1 tablet (2.5 mg total) by mouth daily. 30 tablet 0  . apixaban (ELIQUIS) 2.5 MG TABS tablet Take 1 tablet (2.5 mg total) by mouth 2 (two) times daily. 60 tablet 1  . BIOTIN FORTE PO Take by mouth daily.    [name] demeclocycline (DECLOMYCIN) 150 MG tablet Take 2 tablets (300 mg total) by mouth every 12 (twelve) hours. 20 tablet 0  . furosemide (LASIX) 40 MG tablet Take 40  mg by mouth daily.    . MULTIPLE VITAMIN PO Take by mouth.     No current facility-administered medications for this visit.    Allergies:    Allergies  Allergen Reactions  . Codeine   . Hctz [Hydrochlorothiazide]     Social History:  The patient  reports that she has never smoked. She does not have any smokeless tobacco history on file. She reports that she does not drink alcohol or use illicit drugs.   Family history:   Family History  Problem Relation Age of Onset  . Coronary artery disease Brother     MI at age 86    ROS:  Please see the history of present illness.  All other systems reviewed and negative.   PHYSICAL EXAM: VS:  BP 162/72 mmHg  Pulse 74  Ht 5'  3" (1.6 m)  Wt 119 lb 8 oz (54.205 kg)  BMI 21.17 kg/m2 Well nourished, well developed, in no acute distress HEENT: Pupils are equal round react to light accommodation extraocular movements are intact.  Neck: no JVDNo cervical lymphadenopathy. Cardiac: 3/6 systolic murmur  Lungs:  clear to auscultation bilaterally, no wheezing, rhonchi or rales Abd: soft, nontender, positive bowel sounds all quadrants, no hepatosplenomegaly Ext: no lower extremity edema.  2+ radial and dorsalis pedis pulses. Skin: warm and dry Neuro:  Grossly normal     ASSESSMENT AND PLAN:  Problem List Items Addressed This Visit    SVT (supraventricular tachycardia) - Primary    No SVT seen on 30 day heart monitor      Relevant Medications   furosemide (LASIX) 40 MG tablet   Hypertension    Patient reports her blood pressure at home is usually 120/72. She does get nervous when she is here at the office. I rechecked her blood pressure was also 160/78. Hematocrit make any changes.      Relevant Medications   furosemide (LASIX) 40 MG tablet   CHF (congestive heart failure)    Patient appears euvolemic I did emphasize daily weight monitoring and when to call the office.      Relevant Medications   furosemide (LASIX) 40 MG tablet   Atrial flutter    Overall burden is as high as 74%.  Heart rate is well controlled.  She only takes amlodipine 2.5 mg daily and also Eliquis 2.5 mg twice daily.  She reports feeling pretty good.  Would not add any, beta blocker due to bradycardia.  She comes becomes more symptomatic may need to consider anti-rhythmic.      Relevant Medications   furosemide (LASIX) 40 MG tablet   Aortic stenosis    Her AS is moderate-severe. Recent 2 echo demonstrated an AVA of 0.73 cm2 and mean gradient of 31 mg Hg.  continue to follow with echocardiograms      Relevant Medications   furosemide (LASIX) 40 MG tablet

## 2015-01-24 NOTE — Assessment & Plan Note (Addendum)
Her AS is moderate-severe. Recent 2 echo demonstrated an AVA of 0.73 cm2 and mean gradient of 31 mg Hg.  continue to follow with echocardiograms

## 2015-01-24 NOTE — Assessment & Plan Note (Signed)
Overall burden is as high as 74%.  Heart rate is well controlled.  She only takes amlodipine 2.5 mg daily and also Eliquis 2.5 mg twice daily.  She reports feeling pretty good.  Would not add any, beta blocker due to bradycardia.  She comes becomes more symptomatic may need to consider anti-rhythmic.

## 2015-01-24 NOTE — Assessment & Plan Note (Signed)
Patient appears euvolemic I did emphasize daily weight monitoring and when to call the office.

## 2015-01-24 NOTE — Assessment & Plan Note (Signed)
Patient reports her blood pressure at home is usually 120/72. She does get nervous when she is here at the office. I rechecked her blood pressure was also 160/78. Hematocrit make any changes.

## 2015-01-24 NOTE — Patient Instructions (Addendum)
Monitor your weight every morning.  If you gain 3 pounds in 24 hours, or 5 pounds in a week, call the office for instructions.   Your physician recommends that you schedule a follow-up appointment in: 3 months with Dr. Stanford Breed.

## 2015-01-25 DIAGNOSIS — I5033 Acute on chronic diastolic (congestive) heart failure: Secondary | ICD-10-CM | POA: Diagnosis not present

## 2015-01-25 DIAGNOSIS — I4891 Unspecified atrial fibrillation: Secondary | ICD-10-CM | POA: Diagnosis not present

## 2015-01-25 DIAGNOSIS — Z7901 Long term (current) use of anticoagulants: Secondary | ICD-10-CM | POA: Diagnosis not present

## 2015-01-25 DIAGNOSIS — I10 Essential (primary) hypertension: Secondary | ICD-10-CM | POA: Diagnosis not present

## 2015-01-25 DIAGNOSIS — I350 Nonrheumatic aortic (valve) stenosis: Secondary | ICD-10-CM | POA: Diagnosis not present

## 2015-01-25 DIAGNOSIS — I4892 Unspecified atrial flutter: Secondary | ICD-10-CM | POA: Diagnosis not present

## 2015-01-26 DIAGNOSIS — I5033 Acute on chronic diastolic (congestive) heart failure: Secondary | ICD-10-CM | POA: Diagnosis not present

## 2015-01-26 DIAGNOSIS — I10 Essential (primary) hypertension: Secondary | ICD-10-CM | POA: Diagnosis not present

## 2015-01-26 DIAGNOSIS — Z7901 Long term (current) use of anticoagulants: Secondary | ICD-10-CM | POA: Diagnosis not present

## 2015-01-26 DIAGNOSIS — I350 Nonrheumatic aortic (valve) stenosis: Secondary | ICD-10-CM | POA: Diagnosis not present

## 2015-01-26 DIAGNOSIS — I4892 Unspecified atrial flutter: Secondary | ICD-10-CM | POA: Diagnosis not present

## 2015-01-26 DIAGNOSIS — I4891 Unspecified atrial fibrillation: Secondary | ICD-10-CM | POA: Diagnosis not present

## 2015-01-31 DIAGNOSIS — I4891 Unspecified atrial fibrillation: Secondary | ICD-10-CM | POA: Diagnosis not present

## 2015-01-31 DIAGNOSIS — I5033 Acute on chronic diastolic (congestive) heart failure: Secondary | ICD-10-CM | POA: Diagnosis not present

## 2015-01-31 DIAGNOSIS — I350 Nonrheumatic aortic (valve) stenosis: Secondary | ICD-10-CM | POA: Diagnosis not present

## 2015-01-31 DIAGNOSIS — I4892 Unspecified atrial flutter: Secondary | ICD-10-CM | POA: Diagnosis not present

## 2015-01-31 DIAGNOSIS — Z7901 Long term (current) use of anticoagulants: Secondary | ICD-10-CM | POA: Diagnosis not present

## 2015-01-31 DIAGNOSIS — I10 Essential (primary) hypertension: Secondary | ICD-10-CM | POA: Diagnosis not present

## 2015-02-04 DIAGNOSIS — I350 Nonrheumatic aortic (valve) stenosis: Secondary | ICD-10-CM | POA: Diagnosis not present

## 2015-02-04 DIAGNOSIS — I4891 Unspecified atrial fibrillation: Secondary | ICD-10-CM | POA: Diagnosis not present

## 2015-02-04 DIAGNOSIS — Z7901 Long term (current) use of anticoagulants: Secondary | ICD-10-CM | POA: Diagnosis not present

## 2015-02-04 DIAGNOSIS — I4892 Unspecified atrial flutter: Secondary | ICD-10-CM | POA: Diagnosis not present

## 2015-02-04 DIAGNOSIS — I5033 Acute on chronic diastolic (congestive) heart failure: Secondary | ICD-10-CM | POA: Diagnosis not present

## 2015-02-04 DIAGNOSIS — I10 Essential (primary) hypertension: Secondary | ICD-10-CM | POA: Diagnosis not present

## 2015-02-07 DIAGNOSIS — Z7901 Long term (current) use of anticoagulants: Secondary | ICD-10-CM | POA: Diagnosis not present

## 2015-02-07 DIAGNOSIS — I350 Nonrheumatic aortic (valve) stenosis: Secondary | ICD-10-CM | POA: Diagnosis not present

## 2015-02-07 DIAGNOSIS — I4892 Unspecified atrial flutter: Secondary | ICD-10-CM | POA: Diagnosis not present

## 2015-02-07 DIAGNOSIS — I10 Essential (primary) hypertension: Secondary | ICD-10-CM | POA: Diagnosis not present

## 2015-02-07 DIAGNOSIS — I4891 Unspecified atrial fibrillation: Secondary | ICD-10-CM | POA: Diagnosis not present

## 2015-02-07 DIAGNOSIS — I5033 Acute on chronic diastolic (congestive) heart failure: Secondary | ICD-10-CM | POA: Diagnosis not present

## 2015-02-08 DIAGNOSIS — H4011X2 Primary open-angle glaucoma, moderate stage: Secondary | ICD-10-CM | POA: Diagnosis not present

## 2015-02-08 DIAGNOSIS — H3531 Nonexudative age-related macular degeneration: Secondary | ICD-10-CM | POA: Diagnosis not present

## 2015-02-10 DIAGNOSIS — I5033 Acute on chronic diastolic (congestive) heart failure: Secondary | ICD-10-CM | POA: Diagnosis not present

## 2015-02-10 DIAGNOSIS — I350 Nonrheumatic aortic (valve) stenosis: Secondary | ICD-10-CM | POA: Diagnosis not present

## 2015-02-10 DIAGNOSIS — I4891 Unspecified atrial fibrillation: Secondary | ICD-10-CM | POA: Diagnosis not present

## 2015-02-10 DIAGNOSIS — I10 Essential (primary) hypertension: Secondary | ICD-10-CM | POA: Diagnosis not present

## 2015-02-10 DIAGNOSIS — I4892 Unspecified atrial flutter: Secondary | ICD-10-CM | POA: Diagnosis not present

## 2015-02-10 DIAGNOSIS — Z7901 Long term (current) use of anticoagulants: Secondary | ICD-10-CM | POA: Diagnosis not present

## 2015-02-11 DIAGNOSIS — E871 Hypo-osmolality and hyponatremia: Secondary | ICD-10-CM | POA: Diagnosis not present

## 2015-02-14 DIAGNOSIS — I5033 Acute on chronic diastolic (congestive) heart failure: Secondary | ICD-10-CM | POA: Diagnosis not present

## 2015-02-14 DIAGNOSIS — Z7901 Long term (current) use of anticoagulants: Secondary | ICD-10-CM | POA: Diagnosis not present

## 2015-02-14 DIAGNOSIS — I4892 Unspecified atrial flutter: Secondary | ICD-10-CM | POA: Diagnosis not present

## 2015-02-14 DIAGNOSIS — I4891 Unspecified atrial fibrillation: Secondary | ICD-10-CM | POA: Diagnosis not present

## 2015-02-14 DIAGNOSIS — I350 Nonrheumatic aortic (valve) stenosis: Secondary | ICD-10-CM | POA: Diagnosis not present

## 2015-02-14 DIAGNOSIS — I10 Essential (primary) hypertension: Secondary | ICD-10-CM | POA: Diagnosis not present

## 2015-02-19 DIAGNOSIS — I4892 Unspecified atrial flutter: Secondary | ICD-10-CM | POA: Diagnosis not present

## 2015-02-19 DIAGNOSIS — Z7901 Long term (current) use of anticoagulants: Secondary | ICD-10-CM | POA: Diagnosis not present

## 2015-02-19 DIAGNOSIS — I10 Essential (primary) hypertension: Secondary | ICD-10-CM | POA: Diagnosis not present

## 2015-02-19 DIAGNOSIS — I5033 Acute on chronic diastolic (congestive) heart failure: Secondary | ICD-10-CM | POA: Diagnosis not present

## 2015-02-19 DIAGNOSIS — I350 Nonrheumatic aortic (valve) stenosis: Secondary | ICD-10-CM | POA: Diagnosis not present

## 2015-02-19 DIAGNOSIS — I4891 Unspecified atrial fibrillation: Secondary | ICD-10-CM | POA: Diagnosis not present

## 2015-02-22 DIAGNOSIS — Z7901 Long term (current) use of anticoagulants: Secondary | ICD-10-CM | POA: Diagnosis not present

## 2015-02-22 DIAGNOSIS — I350 Nonrheumatic aortic (valve) stenosis: Secondary | ICD-10-CM | POA: Diagnosis not present

## 2015-02-22 DIAGNOSIS — I4891 Unspecified atrial fibrillation: Secondary | ICD-10-CM | POA: Diagnosis not present

## 2015-02-22 DIAGNOSIS — I4892 Unspecified atrial flutter: Secondary | ICD-10-CM | POA: Diagnosis not present

## 2015-02-22 DIAGNOSIS — I5033 Acute on chronic diastolic (congestive) heart failure: Secondary | ICD-10-CM | POA: Diagnosis not present

## 2015-02-22 DIAGNOSIS — I10 Essential (primary) hypertension: Secondary | ICD-10-CM | POA: Diagnosis not present

## 2015-02-25 DIAGNOSIS — I509 Heart failure, unspecified: Secondary | ICD-10-CM | POA: Diagnosis not present

## 2015-02-25 DIAGNOSIS — E871 Hypo-osmolality and hyponatremia: Secondary | ICD-10-CM | POA: Diagnosis not present

## 2015-03-04 DIAGNOSIS — Z7901 Long term (current) use of anticoagulants: Secondary | ICD-10-CM | POA: Diagnosis not present

## 2015-03-04 DIAGNOSIS — I5033 Acute on chronic diastolic (congestive) heart failure: Secondary | ICD-10-CM | POA: Diagnosis not present

## 2015-03-04 DIAGNOSIS — I350 Nonrheumatic aortic (valve) stenosis: Secondary | ICD-10-CM | POA: Diagnosis not present

## 2015-03-04 DIAGNOSIS — I10 Essential (primary) hypertension: Secondary | ICD-10-CM | POA: Diagnosis not present

## 2015-03-04 DIAGNOSIS — I4892 Unspecified atrial flutter: Secondary | ICD-10-CM | POA: Diagnosis not present

## 2015-03-04 DIAGNOSIS — I4891 Unspecified atrial fibrillation: Secondary | ICD-10-CM | POA: Diagnosis not present

## 2015-03-10 DIAGNOSIS — I4891 Unspecified atrial fibrillation: Secondary | ICD-10-CM | POA: Diagnosis not present

## 2015-03-10 DIAGNOSIS — I5033 Acute on chronic diastolic (congestive) heart failure: Secondary | ICD-10-CM | POA: Diagnosis not present

## 2015-03-10 DIAGNOSIS — I4892 Unspecified atrial flutter: Secondary | ICD-10-CM | POA: Diagnosis not present

## 2015-03-10 DIAGNOSIS — I10 Essential (primary) hypertension: Secondary | ICD-10-CM | POA: Diagnosis not present

## 2015-03-10 DIAGNOSIS — Z7901 Long term (current) use of anticoagulants: Secondary | ICD-10-CM | POA: Diagnosis not present

## 2015-03-10 DIAGNOSIS — I350 Nonrheumatic aortic (valve) stenosis: Secondary | ICD-10-CM | POA: Diagnosis not present

## 2015-03-14 DIAGNOSIS — E871 Hypo-osmolality and hyponatremia: Secondary | ICD-10-CM | POA: Diagnosis not present

## 2015-03-16 DIAGNOSIS — Z7901 Long term (current) use of anticoagulants: Secondary | ICD-10-CM | POA: Diagnosis not present

## 2015-03-16 DIAGNOSIS — I4892 Unspecified atrial flutter: Secondary | ICD-10-CM | POA: Diagnosis not present

## 2015-03-16 DIAGNOSIS — I5033 Acute on chronic diastolic (congestive) heart failure: Secondary | ICD-10-CM | POA: Diagnosis not present

## 2015-03-16 DIAGNOSIS — I4891 Unspecified atrial fibrillation: Secondary | ICD-10-CM | POA: Diagnosis not present

## 2015-03-16 DIAGNOSIS — I10 Essential (primary) hypertension: Secondary | ICD-10-CM | POA: Diagnosis not present

## 2015-03-16 DIAGNOSIS — I350 Nonrheumatic aortic (valve) stenosis: Secondary | ICD-10-CM | POA: Diagnosis not present

## 2015-03-20 DIAGNOSIS — I10 Essential (primary) hypertension: Secondary | ICD-10-CM | POA: Diagnosis not present

## 2015-03-20 DIAGNOSIS — I5033 Acute on chronic diastolic (congestive) heart failure: Secondary | ICD-10-CM | POA: Diagnosis not present

## 2015-03-20 DIAGNOSIS — I350 Nonrheumatic aortic (valve) stenosis: Secondary | ICD-10-CM | POA: Diagnosis not present

## 2015-03-20 DIAGNOSIS — Z7901 Long term (current) use of anticoagulants: Secondary | ICD-10-CM | POA: Diagnosis not present

## 2015-03-20 DIAGNOSIS — I4891 Unspecified atrial fibrillation: Secondary | ICD-10-CM | POA: Diagnosis not present

## 2015-03-20 DIAGNOSIS — I4892 Unspecified atrial flutter: Secondary | ICD-10-CM | POA: Diagnosis not present

## 2015-03-30 DIAGNOSIS — I4891 Unspecified atrial fibrillation: Secondary | ICD-10-CM | POA: Diagnosis not present

## 2015-03-30 DIAGNOSIS — I5033 Acute on chronic diastolic (congestive) heart failure: Secondary | ICD-10-CM | POA: Diagnosis not present

## 2015-03-30 DIAGNOSIS — I10 Essential (primary) hypertension: Secondary | ICD-10-CM | POA: Diagnosis not present

## 2015-03-30 DIAGNOSIS — I4892 Unspecified atrial flutter: Secondary | ICD-10-CM | POA: Diagnosis not present

## 2015-03-30 DIAGNOSIS — I350 Nonrheumatic aortic (valve) stenosis: Secondary | ICD-10-CM | POA: Diagnosis not present

## 2015-03-30 DIAGNOSIS — Z7901 Long term (current) use of anticoagulants: Secondary | ICD-10-CM | POA: Diagnosis not present

## 2015-04-03 DIAGNOSIS — I4892 Unspecified atrial flutter: Secondary | ICD-10-CM | POA: Diagnosis not present

## 2015-04-03 DIAGNOSIS — Z7901 Long term (current) use of anticoagulants: Secondary | ICD-10-CM | POA: Diagnosis not present

## 2015-04-03 DIAGNOSIS — I5033 Acute on chronic diastolic (congestive) heart failure: Secondary | ICD-10-CM | POA: Diagnosis not present

## 2015-04-03 DIAGNOSIS — I4891 Unspecified atrial fibrillation: Secondary | ICD-10-CM | POA: Diagnosis not present

## 2015-04-03 DIAGNOSIS — I350 Nonrheumatic aortic (valve) stenosis: Secondary | ICD-10-CM | POA: Diagnosis not present

## 2015-04-03 DIAGNOSIS — I10 Essential (primary) hypertension: Secondary | ICD-10-CM | POA: Diagnosis not present

## 2015-04-15 DIAGNOSIS — I5033 Acute on chronic diastolic (congestive) heart failure: Secondary | ICD-10-CM | POA: Diagnosis not present

## 2015-04-15 DIAGNOSIS — Z7901 Long term (current) use of anticoagulants: Secondary | ICD-10-CM | POA: Diagnosis not present

## 2015-04-15 DIAGNOSIS — I10 Essential (primary) hypertension: Secondary | ICD-10-CM | POA: Diagnosis not present

## 2015-04-15 DIAGNOSIS — I350 Nonrheumatic aortic (valve) stenosis: Secondary | ICD-10-CM | POA: Diagnosis not present

## 2015-04-15 DIAGNOSIS — I4892 Unspecified atrial flutter: Secondary | ICD-10-CM | POA: Diagnosis not present

## 2015-04-15 DIAGNOSIS — I4891 Unspecified atrial fibrillation: Secondary | ICD-10-CM | POA: Diagnosis not present

## 2015-04-24 NOTE — Progress Notes (Signed)
      HPI: FU AS. History of ablation of AVNRT in 2003. Nuclear study 7/13 showed EF 76 and normal perfusion. Last echocardiogram May 2016 showed normal LV function; grade 2 diastolic dysfunction, moderate to severe AS (mean gradient 31 mmHg), mild MR. Admitted with CHF 6/16 which improved with diuresis; also with newly diagnosed atrial flutter/fibrillation. Monitor 6/16 showed afib rate controlled. Since last seen, She has dyspnea with more vigorous activities. No orthopnea, PND, pedal edema, exertional chest pain, syncope or bleeding. No palpitations.  Current Outpatient Prescriptions  Medication Sig Dispense Refill  . albuterol (PROVENTIL HFA;VENTOLIN HFA) 108 (90 BASE) MCG/ACT inhaler Inhale 2 puffs into the lungs every 4 (four) hours as needed.    [name] amLODipine (NORVASC) 2.5 MG tablet Take 1 tablet (2.5 mg total) by mouth daily. 30 tablet 0  . apixaban (ELIQUIS) 2.5 MG TABS tablet Take 1 tablet (2.5 mg total) by mouth 2 (two) times daily. 60 tablet 1  . furosemide (LASIX) 40 MG tablet Take 40 mg by mouth daily.     No current facility-administered medications for this visit.     Past Medical History  Diagnosis Date  . HTN (hypertension)   . Asthma   . Glaucoma   . Hearing loss   . Prolapsing mitral leaflet syndrome   . SVT (supraventricular tachycardia) (HCC)     S/P ablation of AVNRT in 2003  . Aortic stenosis   . Atrial fibrillation [facility])     Past Surgical History  Procedure Laterality Date  . Appendectomy    . Bladder surgery    . Cardiac surgery    . Nasal sinus surgery      Social History   Social History  . Marital Status: Widowed    Spouse Name: N/A  . Number of Children: 3  . Years of Education: N/A   Occupational History  . Not on file.   Social History Main Topics  . Smoking status: Never Smoker   . Smokeless tobacco: Not on file  . Alcohol Use: No  . Drug Use: No  . Sexual Activity: Not on file   Other Topics Concern  . Not on file   Social  History Narrative    ROS: no fevers or chills, productive cough, hemoptysis, dysphasia, odynophagia, melena, hematochezia, dysuria, hematuria, rash, seizure activity, orthopnea, PND, pedal edema, claudication. Remaining systems are negative.  Physical Exam: Well-developed frail in no acute distress.  Skin is warm and dry.  HEENT is normal.  Neck is supple.  Chest is clear to auscultation with normal expansion.  Cardiovascular exam is regular rate and rhythm. 3/6 systolic murmur LSB; S2 is diminished Abdominal exam nontender or distended. No masses palpated. Extremities show no edema. neuro grossly intact

## 2015-04-25 ENCOUNTER — Telehealth: Payer: Self-pay | Admitting: Cardiology

## 2015-04-25 NOTE — Telephone Encounter (Signed)
Received records from [facility] for appointment on 04/26/15 with Dr Stanford Breed.  Records given to [facility] (medical records) for Dr Jacalyn Lefevre schedule on 04/26/15. lp

## 2015-04-26 ENCOUNTER — Ambulatory Visit (INDEPENDENT_AMBULATORY_CARE_PROVIDER_SITE_OTHER): Payer: Medicare Other | Admitting: Cardiology

## 2015-04-26 ENCOUNTER — Encounter: Payer: Self-pay | Admitting: Cardiology

## 2015-04-26 VITALS — BP 150/70 | HR 60 | Ht 63.0 in | Wt 119.7 lb

## 2015-04-26 DIAGNOSIS — I471 Supraventricular tachycardia: Secondary | ICD-10-CM

## 2015-04-26 DIAGNOSIS — I5032 Chronic diastolic (congestive) heart failure: Secondary | ICD-10-CM | POA: Diagnosis not present

## 2015-04-26 DIAGNOSIS — I350 Nonrheumatic aortic (valve) stenosis: Secondary | ICD-10-CM

## 2015-04-26 DIAGNOSIS — I483 Typical atrial flutter: Secondary | ICD-10-CM | POA: Diagnosis not present

## 2015-04-26 DIAGNOSIS — I10 Essential (primary) hypertension: Secondary | ICD-10-CM

## 2015-04-26 DIAGNOSIS — I4892 Unspecified atrial flutter: Secondary | ICD-10-CM | POA: Diagnosis not present

## 2015-04-26 LAB — CBC
HCT: 37.2 % (ref 36.0–46.0)
HEMOGLOBIN: 12.4 g/dL (ref 12.0–15.0)
MCH: 28.8 pg (ref 26.0–34.0)
MCHC: 33.3 g/dL (ref 30.0–36.0)
MCV: 86.5 fL (ref 78.0–100.0)
MPV: 9 fL (ref 8.6–12.4)
Platelets: 218 10*3/uL (ref 150–400)
RBC: 4.3 MIL/uL (ref 3.87–5.11)
RDW: 14.9 % (ref 11.5–15.5)
WBC: 5.1 10*3/uL (ref 4.0–10.5)

## 2015-04-26 LAB — BASIC METABOLIC PANEL
BUN: 15 mg/dL (ref 7–25)
CO2: 31 mmol/L (ref 20–31)
Calcium: 9.3 mg/dL (ref 8.6–10.4)
Chloride: 95 mmol/L — ABNORMAL LOW (ref 98–110)
Creat: 0.84 mg/dL (ref 0.60–0.88)
Glucose, Bld: 105 mg/dL — ABNORMAL HIGH (ref 65–99)
Potassium: 4.4 mmol/L (ref 3.5–5.3)
Sodium: 133 mmol/L — ABNORMAL LOW (ref 135–146)

## 2015-04-26 NOTE — Patient Instructions (Signed)
Medication Instructions:   NO CHANGE  Labwork:  Your physician recommends that you HAVE LAB WORK TODAY  Follow-Up:  Your physician recommends that you schedule a follow-up appointment in: 3 MONTHS WITH DR CRENSHAW   If you need a refill on your cardiac medications before your next appointment, please call your pharmacy.    

## 2015-04-26 NOTE — Assessment & Plan Note (Signed)
Patient with history of atrial fibrillation/flutter. She remains in this rhythm on examination. Her rate is controlled on the medications. Continue apixaban. Check hemoglobin and renal function.

## 2015-04-26 NOTE — Assessment & Plan Note (Signed)
Patient has a history of diastolic congestive heart failure.She is euvolemic on examination. Continue present dose of Lasix. Check potassium and renal function. Note she has also been hyponatremic previously. We will also follow her sodium.

## 2015-04-26 NOTE — Assessment & Plan Note (Signed)
History of AVNRT ablation.

## 2015-04-26 NOTE — Assessment & Plan Note (Signed)
Continue present blood pressure medications. 

## 2015-04-26 NOTE — Assessment & Plan Note (Signed)
Patient has moderate to severe aortic stenosis. She does have dyspnea on exertion but this is chronic and unchanged. We will plan to repeat her echocardiogram in Feb 2017. I have explained that she will most likely require aortic valve replacement in the future. Given her age we would consider TAVR. I have explained the symptoms of chest pain, syncope and worsening dyspnea that she should be aware of.

## 2015-04-28 ENCOUNTER — Encounter: Payer: Self-pay | Admitting: Cardiology

## 2015-04-28 NOTE — Telephone Encounter (Signed)
This encounter was created in error - please disregard.

## 2015-04-28 NOTE — Telephone Encounter (Signed)
Pt called in stating that she received a call from our office asking her to call back .   Thanks

## 2015-05-06 DIAGNOSIS — Z23 Encounter for immunization: Secondary | ICD-10-CM | POA: Diagnosis not present

## 2015-05-30 DIAGNOSIS — J22 Unspecified acute lower respiratory infection: Secondary | ICD-10-CM | POA: Diagnosis not present

## 2015-07-12 DIAGNOSIS — I350 Nonrheumatic aortic (valve) stenosis: Secondary | ICD-10-CM | POA: Diagnosis not present

## 2015-07-12 DIAGNOSIS — E222 Syndrome of inappropriate secretion of antidiuretic hormone: Secondary | ICD-10-CM | POA: Diagnosis not present

## 2015-07-12 DIAGNOSIS — I509 Heart failure, unspecified: Secondary | ICD-10-CM | POA: Diagnosis not present

## 2015-07-12 DIAGNOSIS — E871 Hypo-osmolality and hyponatremia: Secondary | ICD-10-CM | POA: Diagnosis not present

## 2015-07-12 DIAGNOSIS — I10 Essential (primary) hypertension: Secondary | ICD-10-CM | POA: Diagnosis not present

## 2015-07-19 ENCOUNTER — Encounter: Payer: Self-pay | Admitting: Cardiology

## 2015-07-21 DIAGNOSIS — E871 Hypo-osmolality and hyponatremia: Secondary | ICD-10-CM | POA: Diagnosis not present

## 2015-07-21 DIAGNOSIS — I509 Heart failure, unspecified: Secondary | ICD-10-CM | POA: Diagnosis not present

## 2015-07-22 NOTE — Progress Notes (Signed)
      HPI: FU AS. History of ablation of AVNRT in 2003. Nuclear study 7/13 showed EF 76 and normal perfusion. Last echocardiogram May 2016 showed normal LV function; grade 2 diastolic dysfunction, moderate to severe AS (mean gradient 31 mmHg), mild MR. Admitted with CHF 6/16 which improved with diuresis; also with newly diagnosed atrial flutter/fibrillation. Monitor 6/16 showed afib rate controlled. Since last seen, She notes increased dyspnea on exertion as well as mild orthopnea. Also with mild pedal edema. No chest pain, palpitations or syncope.  Current Outpatient Prescriptions  Medication Sig Dispense Refill  . albuterol (PROVENTIL HFA;VENTOLIN HFA) 108 (90 BASE) MCG/ACT inhaler Inhale 2 puffs into the lungs every 4 (four) hours as needed.    [name] amLODipine (NORVASC) 2.5 MG tablet Take 1 tablet (2.5 mg total) by mouth daily. 30 tablet 0  . apixaban (ELIQUIS) 2.5 MG TABS tablet Take 1 tablet (2.5 mg total) by mouth 2 (two) times daily. 60 tablet 1  . furosemide (LASIX) 40 MG tablet Take 40 mg by mouth daily.     No current facility-administered medications for this visit.     Past Medical History  Diagnosis Date  . HTN (hypertension)   . Asthma   . Glaucoma   . Hearing loss   . Prolapsing mitral leaflet syndrome   . SVT (supraventricular tachycardia) (HCC)     S/P ablation of AVNRT in 2003  . Aortic stenosis   . Atrial fibrillation [facility])     Past Surgical History  Procedure Laterality Date  . Appendectomy    . Bladder surgery    . Cardiac surgery    . Nasal sinus surgery      Social History   Social History  . Marital Status: Widowed    Spouse Name: N/A  . Number of Children: 3  . Years of Education: N/A   Occupational History  . Not on file.   Social History Main Topics  . Smoking status: Never Smoker   . Smokeless tobacco: Not on file  . Alcohol Use: No  . Drug Use: No  . Sexual Activity: Not on file   Other Topics Concern  . Not on file   Social History  Narrative    Family History  Problem Relation Age of Onset  . Coronary artery disease Brother     MI at age 55    ROS: no fevers or chills, productive cough, hemoptysis, dysphasia, odynophagia, melena, hematochezia, dysuria, hematuria, rash, seizure activity, orthopnea, PND, pedal edema, claudication. Remaining systems are negative.  Physical Exam: Well-developed frail in no acute distress.  Skin is warm and dry.  HEENT is normal.  Neck is supple.  Chest is clear to auscultation with normal expansion.  Cardiovascular exam is irregular, 3/6 systolic murmur left sternal border. Abdominal exam nontender or distended. No masses palpated. Extremities show 1+ edema. neuro grossly intact  ECG Atrial fibrillation at a rate of 60. No ST changes.

## 2015-07-29 ENCOUNTER — Encounter: Payer: Self-pay | Admitting: Cardiology

## 2015-07-29 ENCOUNTER — Ambulatory Visit (INDEPENDENT_AMBULATORY_CARE_PROVIDER_SITE_OTHER): Payer: Medicare Other | Admitting: Cardiology

## 2015-07-29 ENCOUNTER — Telehealth: Payer: Self-pay | Admitting: Cardiology

## 2015-07-29 VITALS — BP 150/72 | HR 60 | Ht 63.0 in | Wt 117.0 lb

## 2015-07-29 DIAGNOSIS — I471 Supraventricular tachycardia: Secondary | ICD-10-CM | POA: Diagnosis not present

## 2015-07-29 DIAGNOSIS — I350 Nonrheumatic aortic (valve) stenosis: Secondary | ICD-10-CM | POA: Diagnosis not present

## 2015-07-29 DIAGNOSIS — I4891 Unspecified atrial fibrillation: Secondary | ICD-10-CM | POA: Diagnosis not present

## 2015-07-29 DIAGNOSIS — I10 Essential (primary) hypertension: Secondary | ICD-10-CM | POA: Diagnosis not present

## 2015-07-29 MED ORDER — APIXABAN 2.5 MG PO TABS: 2.5000 mg | ORAL_TABLET | Freq: Two times a day (BID) | ORAL | Status: DC

## 2015-07-29 MED ORDER — FUROSEMIDE 40 MG PO TABS: 60.0000 mg | ORAL_TABLET | Freq: Every day | ORAL | Status: DC

## 2015-07-29 NOTE — Telephone Encounter (Signed)
Spoke with pt dtr, eliquis PA approved.

## 2015-07-29 NOTE — Telephone Encounter (Signed)
New Message  Pt c/o medication issue:  1. Name of Medication: Eliquis  4. What is your medication issue? Insurance wants the office to call the medication in and explain why the patient needs the medicaiton.  Antoine  [phone]   Comments: Please call the patient back and them know if the medication was called in

## 2015-07-29 NOTE — Assessment & Plan Note (Signed)
Patient in atrial fibrillation this morning. This could be contributing to her congestive heart failure as well. If aortic stenosis has not progressed we may need to consider attempting cardioversion. Her rate is controlled on the medications. Continue apixaban. Check hemoglobin and renal function.

## 2015-07-29 NOTE — Assessment & Plan Note (Signed)
Plan as outlined under aortic stenosis. Increased diuretics.

## 2015-07-29 NOTE — Assessment & Plan Note (Signed)
History of ablation.

## 2015-07-29 NOTE — Patient Instructions (Signed)
Medication Instructions:   INCREASE FUROSEMIDE TO 60 MG ONCE DAILY= 1 AND 1/2 TABLETS ONCE DAILY  Labwork:  Your physician recommends that you return for lab work in: [place]  Testing/Procedures:  Your physician has requested that you have an echocardiogram. Echocardiography is a painless test that uses sound waves to create images of your heart. It provides your doctor with information about the size and shape of your heart and how well your heart's chambers and valves are working. This procedure takes approximately one hour. There are no restrictions for this procedure.    Follow-Up:  Your physician recommends that you schedule a follow-up appointment in: [place]   If you need a refill on your cardiac medications before your next appointment, please call your pharmacy.

## 2015-07-29 NOTE — Telephone Encounter (Signed)
Returned call. Pt needed to make sure refills were sent to pharmacy, I submitted these and she is aware.  Calling also because [facility] is asking for reason pt needs Eliquis  - Prior Authorization may be required. Deferred to [facility].

## 2015-07-29 NOTE — Assessment & Plan Note (Signed)
Blood pressure controlled. Continue present medications. 

## 2015-07-29 NOTE — Assessment & Plan Note (Signed)
Patient is having worsening CHF symptoms. She is mildly volume overloaded on examination. Change Lasix to 60 mg daily. Check potassium and renal function in 1 week. Repeat echocardiogram to assess aortic stenosis. She may require TAVR in the near future. I will see her back in 4 weeks to reassess.

## 2015-08-05 DIAGNOSIS — I359 Nonrheumatic aortic valve disorder, unspecified: Secondary | ICD-10-CM | POA: Diagnosis not present

## 2015-08-05 DIAGNOSIS — I350 Nonrheumatic aortic (valve) stenosis: Secondary | ICD-10-CM | POA: Diagnosis not present

## 2015-08-05 LAB — CBC
HCT: 39.9 % (ref 36.0–46.0)
Hemoglobin: 13.2 g/dL (ref 12.0–15.0)
MCH: 28.8 pg (ref 26.0–34.0)
MCHC: 33.1 g/dL (ref 30.0–36.0)
MCV: 86.9 fL (ref 78.0–100.0)
MPV: 9.1 fL (ref 8.6–12.4)
Platelets: 215 10*3/uL (ref 150–400)
RBC: 4.59 MIL/uL (ref 3.87–5.11)
RDW: 15.7 % — ABNORMAL HIGH (ref 11.5–15.5)
WBC: 4.1 10*3/uL (ref 4.0–10.5)

## 2015-08-05 LAB — BASIC METABOLIC PANEL
BUN: 16 mg/dL (ref 7–25)
CHLORIDE: 92 mmol/L — AB (ref 98–110)
CO2: 34 mmol/L — AB (ref 20–31)
Calcium: 9.4 mg/dL (ref 8.6–10.4)
Creat: 0.9 mg/dL — ABNORMAL HIGH (ref 0.60–0.88)
Glucose, Bld: 101 mg/dL — ABNORMAL HIGH (ref 65–99)
Potassium: 3.8 mmol/L (ref 3.5–5.3)
SODIUM: 132 mmol/L — AB (ref 135–146)

## 2015-08-11 ENCOUNTER — Other Ambulatory Visit (HOSPITAL_COMMUNITY): Payer: Medicare Other

## 2015-08-11 DIAGNOSIS — H401131 Primary open-angle glaucoma, bilateral, mild stage: Secondary | ICD-10-CM | POA: Diagnosis not present

## 2015-08-11 DIAGNOSIS — H353131 Nonexudative age-related macular degeneration, bilateral, early dry stage: Secondary | ICD-10-CM | POA: Diagnosis not present

## 2015-08-11 DIAGNOSIS — Z961 Presence of intraocular lens: Secondary | ICD-10-CM | POA: Diagnosis not present

## 2015-08-12 ENCOUNTER — Other Ambulatory Visit: Payer: Self-pay

## 2015-08-12 ENCOUNTER — Ambulatory Visit (HOSPITAL_COMMUNITY): Payer: Medicare Other | Attending: Cardiology

## 2015-08-12 DIAGNOSIS — I340 Nonrheumatic mitral (valve) insufficiency: Secondary | ICD-10-CM | POA: Insufficient documentation

## 2015-08-12 DIAGNOSIS — I352 Nonrheumatic aortic (valve) stenosis with insufficiency: Secondary | ICD-10-CM | POA: Diagnosis not present

## 2015-08-12 DIAGNOSIS — I071 Rheumatic tricuspid insufficiency: Secondary | ICD-10-CM | POA: Insufficient documentation

## 2015-08-12 DIAGNOSIS — I359 Nonrheumatic aortic valve disorder, unspecified: Secondary | ICD-10-CM | POA: Diagnosis present

## 2015-08-12 DIAGNOSIS — I119 Hypertensive heart disease without heart failure: Secondary | ICD-10-CM | POA: Insufficient documentation

## 2015-08-12 DIAGNOSIS — I350 Nonrheumatic aortic (valve) stenosis: Secondary | ICD-10-CM

## 2015-08-30 NOTE — Progress Notes (Signed)
      HPI: FU AS. History of ablation of AVNRT in 2003. Nuclear study 7/13 showed EF 76 and normal perfusion. Echocardiogram May 2016 showed normal LV function; grade 2 diastolic dysfunction, moderate to severe AS (mean gradient 31 mmHg), mild MR. Admitted with CHF 6/16 which improved with diuresis; also with newly diagnosed atrial flutter/fibrillation. Monitor 6/16 showed afib rate controlled. Echocardiogram repeated February 2017 and showed normal LV systolic function, severe aortic stenosis with mean gradient 43 mmHg, mild mitral regurgitation, moderate tricuspid regurgitation and mildly elevated pulmonary pressures. Since last seen, She has some dyspnea on exertion but improved. There is no orthopnea, PND or pedal edema has also improved. She does not have exertional chest pain or syncope.  Current Outpatient Prescriptions  Medication Sig Dispense Refill  . albuterol (PROVENTIL HFA;VENTOLIN HFA) 108 (90 BASE) MCG/ACT inhaler Inhale 2 puffs into the lungs every 4 (four) hours as needed.    [name] amLODipine (NORVASC) 2.5 MG tablet Take 1 tablet (2.5 mg total) by mouth daily. 30 tablet 0  . apixaban (ELIQUIS) 2.5 MG TABS tablet Take 1 tablet (2.5 mg total) by mouth 2 (two) times daily. 60 tablet 1  . furosemide (LASIX) 40 MG tablet Take 1.5 tablets (60 mg total) by mouth daily. (Patient taking differently: Take 60 mg by mouth daily. Take one and one-half tablets (60mg ) daily.) 45 tablet 6   No current facility-administered medications for this visit.     Past Medical History  Diagnosis Date  . HTN (hypertension)   . Asthma   . Glaucoma   . Hearing loss   . Prolapsing mitral leaflet syndrome   . SVT (supraventricular tachycardia) (HCC)     S/P ablation of AVNRT in 2003  . Aortic stenosis   . Atrial fibrillation [facility])     Past Surgical History  Procedure Laterality Date  . Appendectomy    . Bladder surgery    . Cardiac surgery    . Nasal sinus surgery      Social History   Social  History  . Marital Status: Widowed    Spouse Name: N/A  . Number of Children: 3  . Years of Education: N/A   Occupational History  . Not on file.   Social History Main Topics  . Smoking status: Never Smoker   . Smokeless tobacco: Not on file  . Alcohol Use: No  . Drug Use: No  . Sexual Activity: Not on file   Other Topics Concern  . Not on file   Social History Narrative    Family History  Problem Relation Age of Onset  . Coronary artery disease Brother     MI at age 71    ROS: no fevers or chills, productive cough, hemoptysis, dysphasia, odynophagia, melena, hematochezia, dysuria, hematuria, rash, seizure activity, orthopnea, PND, claudication. Remaining systems are negative.  Physical Exam: Well-developed well-nourished in no acute distress.  Skin is warm and dry.  HEENT is normal.  Neck is supple.  Chest is clear to auscultation with normal expansion.  Cardiovascular exam is irregular and bradycardic, 3/6 systolic murmur left sternal border. S2 is diminished. Abdominal exam nontender or distended. No masses palpated. Extremities show trace edema. neuro grossly intact  ECG Atrial fibrillation with slow ventricular response.

## 2015-09-06 ENCOUNTER — Encounter: Payer: Self-pay | Admitting: Cardiology

## 2015-09-06 ENCOUNTER — Ambulatory Visit (INDEPENDENT_AMBULATORY_CARE_PROVIDER_SITE_OTHER): Payer: Medicare Other | Admitting: Cardiology

## 2015-09-06 VITALS — BP 142/70 | HR 45 | Ht 62.0 in | Wt 111.2 lb

## 2015-09-06 DIAGNOSIS — I350 Nonrheumatic aortic (valve) stenosis: Secondary | ICD-10-CM

## 2015-09-06 DIAGNOSIS — I480 Paroxysmal atrial fibrillation: Secondary | ICD-10-CM | POA: Insufficient documentation

## 2015-09-06 DIAGNOSIS — I5032 Chronic diastolic (congestive) heart failure: Secondary | ICD-10-CM

## 2015-09-06 DIAGNOSIS — I4891 Unspecified atrial fibrillation: Secondary | ICD-10-CM

## 2015-09-06 DIAGNOSIS — I10 Essential (primary) hypertension: Secondary | ICD-10-CM

## 2015-09-06 NOTE — Assessment & Plan Note (Addendum)
Patient's echocardiogram demonstrates severe aortic stenosis.She has progressive CHF symptoms that are now improved on higher dose diuretics. I have explained that this is a progressive process. I did not think she would be a very good candidate for open aortic valve replacement but I will refer her for consideration of TAVR. Right and left catheterization can be arranged if she is felt to be a good candidate for TAVR.

## 2015-09-06 NOTE — Assessment & Plan Note (Signed)
Continue present blood pressure medications. 

## 2015-09-06 NOTE — Patient Instructions (Signed)
REFERRAL TO DR Burt Knack OR DR Bancroft  Your physician recommends that you schedule a follow-up appointment in: [place]

## 2015-09-06 NOTE — Assessment & Plan Note (Signed)
Patient remains in atrial fibrillation. Rate is mildly decreased. We will avoid AV nodal blocking agents. Continue apixaban.

## 2015-09-06 NOTE — Assessment & Plan Note (Signed)
Chronic diastolic congestive heart failure With contribution from aortic stenosis. Much improved with high-dose diuretics. Continue Lasix at present dose.

## 2015-09-16 ENCOUNTER — Ambulatory Visit (INDEPENDENT_AMBULATORY_CARE_PROVIDER_SITE_OTHER): Payer: Medicare Other | Admitting: Cardiovascular Disease

## 2015-09-16 ENCOUNTER — Encounter: Payer: Self-pay | Admitting: Cardiovascular Disease

## 2015-09-16 ENCOUNTER — Encounter: Payer: Self-pay | Admitting: *Deleted

## 2015-09-16 VITALS — BP 140/82 | HR 64 | Ht 62.0 in | Wt 114.8 lb

## 2015-09-16 DIAGNOSIS — I359 Nonrheumatic aortic valve disorder, unspecified: Secondary | ICD-10-CM | POA: Diagnosis not present

## 2015-09-16 DIAGNOSIS — I350 Nonrheumatic aortic (valve) stenosis: Secondary | ICD-10-CM | POA: Diagnosis not present

## 2015-09-16 DIAGNOSIS — I481 Persistent atrial fibrillation: Secondary | ICD-10-CM | POA: Diagnosis not present

## 2015-09-16 DIAGNOSIS — I4819 Other persistent atrial fibrillation: Secondary | ICD-10-CM

## 2015-09-16 LAB — CBC WITH DIFFERENTIAL/PLATELET
BASOS PCT: 1 % (ref 0–1)
Basophils Absolute: 0 10*3/uL (ref 0.0–0.1)
Eosinophils Absolute: 0.1 10*3/uL (ref 0.0–0.7)
Eosinophils Relative: 2 % (ref 0–5)
HCT: 39.6 % (ref 36.0–46.0)
HEMOGLOBIN: 13.2 g/dL (ref 12.0–15.0)
LYMPHS ABS: 1.6 10*3/uL (ref 0.7–4.0)
Lymphocytes Relative: 34 % (ref 12–46)
MCH: 28.9 pg (ref 26.0–34.0)
MCHC: 33.3 g/dL (ref 30.0–36.0)
MCV: 86.8 fL (ref 78.0–100.0)
MPV: 9.3 fL (ref 8.6–12.4)
Monocytes Absolute: 0.6 10*3/uL (ref 0.1–1.0)
Monocytes Relative: 13 % — ABNORMAL HIGH (ref 3–12)
NEUTROS ABS: 2.4 10*3/uL (ref 1.7–7.7)
NEUTROS PCT: 50 % (ref 43–77)
PLATELETS: 206 10*3/uL (ref 150–400)
RBC: 4.56 MIL/uL (ref 3.87–5.11)
RDW: 15 % (ref 11.5–15.5)
WBC: 4.7 10*3/uL (ref 4.0–10.5)

## 2015-09-16 NOTE — Patient Instructions (Signed)
Medication Instructions:  Your physician recommends that you continue on your current medications as directed. Please refer to the Current Medication list given to you today.  See catheterization instruction sheet.    Labwork: Lab work to be done today--BMP,CBC, PT  Testing/Procedures:. Your physician has requested that you have a cardiac catheterization. Cardiac catheterization is used to diagnose and/or treat various heart conditions. Doctors may recommend this procedure for a number of different reasons. The most common reason is to evaluate chest pain. Chest pain can be a symptom of coronary artery disease (CAD), and cardiac catheterization can show whether plaque is narrowing or blocking your heart's arteries. This procedure is also used to evaluate the valves, as well as measure the blood flow and oxygen levels in different parts of your heart. For further information please visit [email or web address]. Please follow instruction sheet, as given.  Scheduled for April 3,2017    Follow-Up: To be arranged after catheterization  Any Other Special Instructions Will Be Listed Below (If Applicable).     If you need a refill on your cardiac medications before your next appointment, please call your pharmacy.

## 2015-09-16 NOTE — Progress Notes (Signed)
Valve Clinic Note  Referring: Crenshaw  Chief Complaint  Patient presents with  . New Evaluation    TAVR consult    History of Present Illness: 82 yo female with history of severe aortic valve stenosis, HTN, persistent atrial fibrillation on Eliquis, asthma, SVT, chronic diastolic CHF referred to valve clinic today for further discussion regarding her aortic valve stenosis and possible TAVR. She is followed by Dr. Stanford Breed. She has been followed for many years for aortic stenosis. Most recent echo 08/12/15 with normal LV systolic function, severe aortic stenosis. The aortic valve is thickened and calcified with mean gradient of 43 mm Hg, peak gradient 71 mmHg, AVA 0.44 cm2. There is mild mitral valve regurgitation and moderate tricuspid regurgitation. She has had progressive dyspnea over the last 6 months. She has been on Lasix but her dyspnea is worsening. No chest pain or syncope. No dizziness. She lives alone. She does her own cooking and cleaning. She still drives. She is a widow. Her daughter reports no memory issues. She is very functional.   Primary Care Physician: Stephens Shire, MD  Primary Cardiology: Stanford Breed   Past Medical History  Diagnosis Date  . HTN (hypertension)   . Asthma   . Glaucoma   . Hearing loss   . Prolapsing mitral leaflet syndrome   . SVT (supraventricular tachycardia) (HCC)     S/P ablation of AVNRT in 2003  . Aortic stenosis   . Atrial fibrillation [facility])     Past Surgical History  Procedure Laterality Date  . Appendectomy    . Bladder surgery    . Cardiac surgery    . Nasal sinus surgery      Current Outpatient Prescriptions  Medication Sig Dispense Refill  . albuterol (PROVENTIL HFA;VENTOLIN HFA) 108 (90 BASE) MCG/ACT inhaler Inhale 2 puffs into the lungs every 4 (four) hours as needed.    [name] amLODipine (NORVASC) 2.5 MG tablet Take 1 tablet (2.5 mg total) by mouth daily. 30 tablet 0  . apixaban (ELIQUIS) 2.5 MG TABS tablet Take 1 tablet (2.5 mg  total) by mouth 2 (two) times daily. 60 tablet 1  . furosemide (LASIX) 40 MG tablet Take 1.5 tablets (60 mg total) by mouth daily. (Patient taking differently: Take 60 mg by mouth daily. Take one and one-half tablets (60mg ) daily.) 45 tablet 6   No current facility-administered medications for this visit.    Allergies  Allergen Reactions  . Codeine Other (See Comments)    Made pt feel ill   . Hctz [Hydrochlorothiazide] Other (See Comments)    Pt was ill and affected kidneys     Social History   Social History  . Marital Status: Widowed    Spouse Name: N/A  . Number of Children: 3  . Years of Education: N/A   Occupational History  . Not on file.   Social History Main Topics  . Smoking status: Never Smoker   . Smokeless tobacco: Not on file  . Alcohol Use: No  . Drug Use: No  . Sexual Activity: Not on file   Other Topics Concern  . Not on file   Social History Narrative    Family History  Problem Relation Age of Onset  . Pneumonia Father     Review of Systems:  As stated in the HPI and otherwise negative.   BP 140/82 mmHg  Pulse 64  Ht 5\' 2"  (1.575 m)  Wt 114 lb 12.8 oz (52.073 kg)  BMI 20.99 kg/m2  SpO2 98%  Physical Examination: General: Well developed, well nourished, NAD HEENT: OP clear, mucus membranes moist SKIN: warm, dry. No rashes. Neuro: No focal deficits Musculoskeletal: Muscle strength 5/5 all ext Psychiatric: Mood and affect normal Neck: No JVD, no carotid bruits, no thyromegaly, no lymphadenopathy. Lungs:Clear bilaterally, no wheezes, rhonci, crackles Cardiovascular: Irreg irreg with loud harsh systolic murmur. No gallops or rubs. Abdomen:Soft. Bowel sounds present. Non-tender.  Extremities: No lower extremity edema. Pulses are 2 + in the bilateral DP/PT.  Echo 08/12/15: Left ventricle: The cavity size was normal. Systolic function was  normal. The estimated ejection fraction was in the range of 60%  to 65%. Wall motion was normal;  there were no regional wall  motion abnormalities. - Aortic valve: Valve mobility was restricted. There was severe  stenosis. There was mild regurgitation. Mean gradient (S): 43 mm  Hg. Peak gradient (S): 71 mm Hg. - Aortic root: The aortic root was normal in size. - Ascending aorta: The ascending aorta was normal in size. - Mitral valve: Structurally normal valve. There was mild  regurgitation. - Left atrium: The atrium was normal in size. - Right ventricle: The cavity size was normal. Wall thickness was  normal. Systolic function was normal. - Right atrium: The atrium was mildly dilated. - Tricuspid valve: There was moderate regurgitation. - Pulmonary arteries: Systolic pressure was mildly increased. PA  peak pressure: 42 mm Hg (S). - Inferior vena cava: The vessel was normal in size. - Pericardium, extracardiac: There was no pericardial effusion.  EKG:  EKG is not ordered today but I have reviewed the EKG from 09/06/15. This shows atrial fib, rate in the 40s with non-specific ST and T wave abnormalities.   Recent Labs: 12/12/2014: B Natriuretic Peptide 277.0* 12/14/2014: Magnesium 1.9 08/05/2015: BUN 16; Creat 0.90*; Hemoglobin 13.2; Platelets 215; Potassium 3.8; Sodium 132*   Lipid Panel No results found for: CHOL, TRIG, HDL, CHOLHDL, VLDL, LDLCALC, LDLDIRECT   Wt Readings from Last 3 Encounters:  09/16/15 114 lb 12.8 oz (52.073 kg)  09/06/15 111 lb 3.2 oz (50.44 kg)  07/29/15 117 lb (53.071 kg)    Other studies Reviewed: Additional studies/ records that were reviewed today include: Echo images are personally reviewed by me. Review of the above records demonstrates:   STS Risk Score:  Risk of Mortality: 4.097% Morbidity or Mortality: 22.329% Long Length of Stay: 9.591% Short Length of Stay: 20.333% Permanent Stroke: 2.526% Prolonged Ventilation: 13.159% DSW Infection: 0.111% Renal Failure: 4.793% Reoperation: 9.879%  Assessment and Plan:   1. Severe aortic  valve stenosis: She has stage D symptomatic aortic valve stenosis. I have personally reviewed the echo images. The aortic valve is thickened, calcified with limited leaflet mobility. I think she would benefit from AVR. Given advanced age, she is not a good candidate for conventional AVR by surgical approach. STS risk score 4.09% but clearly she is not an optimal candidate for surgical approach to AVR. I think she may be a good candidate for TAVR. She is a very functional 82 yo patient. I have reviewed the TAVR procedure in detail today with the patient and her family. She would like to proceed with planning for TAVR. I will arrange a right and left heart catheterization at [facility] on 09/26/15. Risks and benefits of procedure reviewed with the patient. After the cath, she will be referred to see one of the CT surgeons on our TAVR team. She will then have Cardiac CT and CTA of the chest/abd/pelvis.   2. Atrial fibrillation: Rate controlled. She  is on Eliquis. This will be held 2 days prior to her cardiac cath.   Current medicines are reviewed at length with the patient today.  The patient does not have concerns regarding medicines.  The following changes have been made:  no change  Labs/ tests ordered today include:  No orders of the defined types were placed in this encounter.    Disposition:   FU with TAVR CT surgeon after cath.    Signed, Lauree Chandler, MD 09/16/2015 2:22 PM    [facility] [place], [place], [place]  [address] Phone: [phone]; Fax: [phone]

## 2015-09-17 LAB — BASIC METABOLIC PANEL
BUN: 18 mg/dL (ref 7–25)
CHLORIDE: 92 mmol/L — AB (ref 98–110)
CO2: 31 mmol/L (ref 20–31)
Calcium: 9.2 mg/dL (ref 8.6–10.4)
Creat: 0.92 mg/dL — ABNORMAL HIGH (ref 0.60–0.88)
Glucose, Bld: 103 mg/dL — ABNORMAL HIGH (ref 65–99)
Potassium: 4.5 mmol/L (ref 3.5–5.3)
SODIUM: 135 mmol/L (ref 135–146)

## 2015-09-17 LAB — PROTIME-INR
INR: 1.15 (ref <1.50)
Prothrombin Time: 14.8 seconds (ref 11.6–15.2)

## 2015-09-26 ENCOUNTER — Ambulatory Visit (HOSPITAL_COMMUNITY)
Admission: RE | Admit: 2015-09-26 | Discharge: 2015-09-26 | Disposition: A | Discharge status: Home / Self Care | Payer: Medicare Other | Source: Ambulatory Visit | Attending: Cardiovascular Disease | Admitting: Cardiovascular Disease

## 2015-09-26 ENCOUNTER — Encounter (HOSPITAL_COMMUNITY)
Admission: RE | Disposition: A | Discharge status: Home / Self Care | Payer: Self-pay | Source: Ambulatory Visit | Attending: Cardiovascular Disease

## 2015-09-26 ENCOUNTER — Encounter (HOSPITAL_COMMUNITY): Payer: Self-pay | Admitting: Cardiovascular Disease

## 2015-09-26 DIAGNOSIS — I2510 Atherosclerotic heart disease of native coronary artery without angina pectoris: Secondary | ICD-10-CM | POA: Insufficient documentation

## 2015-09-26 DIAGNOSIS — I110 Hypertensive heart disease with heart failure: Secondary | ICD-10-CM | POA: Diagnosis not present

## 2015-09-26 DIAGNOSIS — I350 Nonrheumatic aortic (valve) stenosis: Secondary | ICD-10-CM

## 2015-09-26 DIAGNOSIS — I5032 Chronic diastolic (congestive) heart failure: Secondary | ICD-10-CM | POA: Insufficient documentation

## 2015-09-26 DIAGNOSIS — J45909 Unspecified asthma, uncomplicated: Secondary | ICD-10-CM | POA: Insufficient documentation

## 2015-09-26 DIAGNOSIS — Z7901 Long term (current) use of anticoagulants: Secondary | ICD-10-CM | POA: Diagnosis not present

## 2015-09-26 DIAGNOSIS — I2584 Coronary atherosclerosis due to calcified coronary lesion: Secondary | ICD-10-CM | POA: Insufficient documentation

## 2015-09-26 DIAGNOSIS — I481 Persistent atrial fibrillation: Secondary | ICD-10-CM | POA: Diagnosis not present

## 2015-09-26 DIAGNOSIS — H409 Unspecified glaucoma: Secondary | ICD-10-CM | POA: Diagnosis not present

## 2015-09-26 HISTORY — PX: CARDIAC CATHETERIZATION: SHX172

## 2015-09-26 LAB — POCT I-STAT 3, VENOUS BLOOD GAS (G3P V)
ACID-BASE EXCESS: 4 mmol/L — AB (ref 0.0–2.0)
BICARBONATE: 30.1 meq/L — AB (ref 20.0–24.0)
O2 SAT: 59 %
PH VEN: 7.37 — AB (ref 7.250–7.300)
TCO2: 32 mmol/L (ref 0–100)
pCO2, Ven: 52 mmHg — ABNORMAL HIGH (ref 45.0–50.0)
pO2, Ven: 32 mmHg (ref 31.0–45.0)

## 2015-09-26 LAB — POCT I-STAT 3, ART BLOOD GAS (G3+)
ACID-BASE EXCESS: 2 mmol/L (ref 0.0–2.0)
BICARBONATE: 27.4 meq/L — AB (ref 20.0–24.0)
O2 SAT: 92 %
PO2 ART: 63 mmHg — AB (ref 80.0–100.0)
TCO2: 29 mmol/L (ref 0–100)
pCO2 arterial: 44.1 mmHg (ref 35.0–45.0)
pH, Arterial: 7.4 (ref 7.350–7.450)

## 2015-09-26 SURGERY — RIGHT/LEFT HEART CATH AND CORONARY ANGIOGRAPHY
Anesthesia: LOCAL

## 2015-09-26 MED ORDER — MIDAZOLAM HCL 2 MG/2ML IJ SOLN
INTRAMUSCULAR | Status: AC
  Filled 2015-09-26: qty 2

## 2015-09-26 MED ORDER — IOPAMIDOL (ISOVUE-370) INJECTION 76%
INTRAVENOUS | Status: DC | PRN
  Administered 2015-09-26: 50 mL via INTRA_ARTERIAL

## 2015-09-26 MED ORDER — VERAPAMIL HCL 2.5 MG/ML IV SOLN
INTRAVENOUS | Status: AC
  Filled 2015-09-26: qty 2

## 2015-09-26 MED ORDER — SODIUM CHLORIDE 0.9 % IV SOLN: 250.0000 mL | INTRAVENOUS | Status: DC | PRN

## 2015-09-26 MED ORDER — LIDOCAINE HCL (PF) 1 % IJ SOLN
INTRAMUSCULAR | Status: AC
  Filled 2015-09-26: qty 30

## 2015-09-26 MED ORDER — VERAPAMIL HCL 2.5 MG/ML IV SOLN
INTRAVENOUS | Status: DC | PRN
  Administered 2015-09-26: 10 mL via INTRA_ARTERIAL

## 2015-09-26 MED ORDER — FENTANYL CITRATE (PF) 100 MCG/2ML IJ SOLN
INTRAMUSCULAR | Status: DC | PRN
  Administered 2015-09-26: 25 ug via INTRAVENOUS

## 2015-09-26 MED ORDER — HEPARIN (PORCINE) IN NACL 2-0.9 UNIT/ML-% IJ SOLN
INTRAMUSCULAR | Status: DC | PRN
  Administered 2015-09-26: 09:00:00

## 2015-09-26 MED ORDER — HEPARIN (PORCINE) IN NACL 2-0.9 UNIT/ML-% IJ SOLN
INTRAMUSCULAR | Status: AC
  Filled 2015-09-26: qty 1000

## 2015-09-26 MED ORDER — SODIUM CHLORIDE 0.9% FLUSH: 3.0000 mL | Freq: Two times a day (BID) | INTRAVENOUS | Status: DC

## 2015-09-26 MED ORDER — HEPARIN SODIUM (PORCINE) 1000 UNIT/ML IJ SOLN
INTRAMUSCULAR | Status: AC
  Filled 2015-09-26: qty 1

## 2015-09-26 MED ORDER — SODIUM CHLORIDE 0.9% FLUSH: 3.0000 mL | INTRAVENOUS | Status: DC | PRN

## 2015-09-26 MED ORDER — FENTANYL CITRATE (PF) 100 MCG/2ML IJ SOLN
INTRAMUSCULAR | Status: AC
  Filled 2015-09-26: qty 2

## 2015-09-26 MED ORDER — MIDAZOLAM HCL 2 MG/2ML IJ SOLN
INTRAMUSCULAR | Status: DC | PRN
  Administered 2015-09-26: 1 mg via INTRAVENOUS

## 2015-09-26 MED ORDER — ASPIRIN 81 MG PO CHEW
81.0000 mg | CHEWABLE_TABLET | ORAL | Status: AC
  Administered 2015-09-26: 81 mg via ORAL

## 2015-09-26 MED ORDER — LIDOCAINE HCL (PF) 1 % IJ SOLN
INTRAMUSCULAR | Status: DC | PRN
  Administered 2015-09-26: 4 mL via INTRADERMAL

## 2015-09-26 MED ORDER — SODIUM CHLORIDE 0.9 % IV SOLN
INTRAVENOUS | Status: AC
  Administered 2015-09-26: 08:00:00 via INTRAVENOUS

## 2015-09-26 MED ORDER — SODIUM CHLORIDE 0.9 % IV SOLN: INTRAVENOUS | Status: AC

## 2015-09-26 MED ORDER — HEPARIN SODIUM (PORCINE) 1000 UNIT/ML IJ SOLN
INTRAMUSCULAR | Status: DC | PRN
  Administered 2015-09-26: 3000 [IU] via INTRAVENOUS

## 2015-09-26 MED ORDER — IOPAMIDOL (ISOVUE-370) INJECTION 76%
INTRAVENOUS | Status: AC
  Filled 2015-09-26: qty 100

## 2015-09-26 MED ORDER — ASPIRIN 81 MG PO CHEW
CHEWABLE_TABLET | ORAL | Status: AC
  Administered 2015-09-26: 81 mg via ORAL
  Filled 2015-09-26: qty 1

## 2015-09-26 SURGICAL SUPPLY — 16 items
CATH BALLN WEDGE 5F 110CM (CATHETERS) ×2 IMPLANT
CATH INFINITI 5 FR JL3.5 (CATHETERS) ×2 IMPLANT
CATH INFINITI 5FR AL1 (CATHETERS) ×2 IMPLANT
CATH INFINITI JR4 5F (CATHETERS) ×2 IMPLANT
DEVICE RAD COMP TR BAND LRG (VASCULAR PRODUCTS) ×2 IMPLANT
GLIDESHEATH SLEND SS 6F .021 (SHEATH) ×2 IMPLANT
GUIDEWIRE .025 260CM (WIRE) ×2 IMPLANT
KIT HEART LEFT (KITS) ×2 IMPLANT
PACK CARDIAC CATHETERIZATION (CUSTOM PROCEDURE TRAY) ×2 IMPLANT
SHEATH CLASSIC 8F 25CM (SHEATH) IMPLANT
SHEATH FAST CATH BRACH 5F 5CM (SHEATH) ×2 IMPLANT
TRANSDUCER W/STOPCOCK (MISCELLANEOUS) ×2 IMPLANT
TUBING CIL FLEX 10 FLL-RA (TUBING) ×2 IMPLANT
WIRE EMERALD ST .035X150CM (WIRE) ×2 IMPLANT
WIRE HI TORQ VERSACORE-J 145CM (WIRE) ×2 IMPLANT
WIRE SAFE-T 1.5MM-J .035X260CM (WIRE) ×2 IMPLANT

## 2015-09-26 NOTE — H&P (View-Only) (Signed)
Valve Clinic Note  Referring: Crenshaw  Chief Complaint  Patient presents with  . New Evaluation    TAVR consult    History of Present Illness: 82 yo female with history of severe aortic valve stenosis, HTN, persistent atrial fibrillation on Eliquis, asthma, SVT, chronic diastolic CHF referred to valve clinic today for further discussion regarding her aortic valve stenosis and possible TAVR. She is followed by Dr. Stanford Breed. She has been followed for many years for aortic stenosis. Most recent echo 08/12/15 with normal LV systolic function, severe aortic stenosis. The aortic valve is thickened and calcified with mean gradient of 43 mm Hg, peak gradient 71 mmHg, AVA 0.44 cm2. There is mild mitral valve regurgitation and moderate tricuspid regurgitation. She has had progressive dyspnea over the last 6 months. She has been on Lasix but her dyspnea is worsening. No chest pain or syncope. No dizziness. She lives alone. She does her own cooking and cleaning. She still drives. She is a widow. Her daughter reports no memory issues. She is very functional.   Primary Care Physician: Stephens Shire, MD  Primary Cardiology: Stanford Breed   Past Medical History  Diagnosis Date  . HTN (hypertension)   . Asthma   . Glaucoma   . Hearing loss   . Prolapsing mitral leaflet syndrome   . SVT (supraventricular tachycardia) (HCC)     S/P ablation of AVNRT in 2003  . Aortic stenosis   . Atrial fibrillation [facility])     Past Surgical History  Procedure Laterality Date  . Appendectomy    . Bladder surgery    . Cardiac surgery    . Nasal sinus surgery      Current Outpatient Prescriptions  Medication Sig Dispense Refill  . albuterol (PROVENTIL HFA;VENTOLIN HFA) 108 (90 BASE) MCG/ACT inhaler Inhale 2 puffs into the lungs every 4 (four) hours as needed.    [name] amLODipine (NORVASC) 2.5 MG tablet Take 1 tablet (2.5 mg total) by mouth daily. 30 tablet 0  . apixaban (ELIQUIS) 2.5 MG TABS tablet Take 1 tablet (2.5 mg  total) by mouth 2 (two) times daily. 60 tablet 1  . furosemide (LASIX) 40 MG tablet Take 1.5 tablets (60 mg total) by mouth daily. (Patient taking differently: Take 60 mg by mouth daily. Take one and one-half tablets (60mg ) daily.) 45 tablet 6   No current facility-administered medications for this visit.    Allergies  Allergen Reactions  . Codeine Other (See Comments)    Made pt feel ill   . Hctz [Hydrochlorothiazide] Other (See Comments)    Pt was ill and affected kidneys     Social History   Social History  . Marital Status: Widowed    Spouse Name: N/A  . Number of Children: 3  . Years of Education: N/A   Occupational History  . Not on file.   Social History Main Topics  . Smoking status: Never Smoker   . Smokeless tobacco: Not on file  . Alcohol Use: No  . Drug Use: No  . Sexual Activity: Not on file   Other Topics Concern  . Not on file   Social History Narrative    Family History  Problem Relation Age of Onset  . Pneumonia Father     Review of Systems:  As stated in the HPI and otherwise negative.   BP 140/82 mmHg  Pulse 64  Ht 5\' 2"  (1.575 m)  Wt 114 lb 12.8 oz (52.073 kg)  BMI 20.99 kg/m2  SpO2 98%  Physical Examination: General: Well developed, well nourished, NAD HEENT: OP clear, mucus membranes moist SKIN: warm, dry. No rashes. Neuro: No focal deficits Musculoskeletal: Muscle strength 5/5 all ext Psychiatric: Mood and affect normal Neck: No JVD, no carotid bruits, no thyromegaly, no lymphadenopathy. Lungs:Clear bilaterally, no wheezes, rhonci, crackles Cardiovascular: Irreg irreg with loud harsh systolic murmur. No gallops or rubs. Abdomen:Soft. Bowel sounds present. Non-tender.  Extremities: No lower extremity edema. Pulses are 2 + in the bilateral DP/PT.  Echo 08/12/15: Left ventricle: The cavity size was normal. Systolic function was  normal. The estimated ejection fraction was in the range of 60%  to 65%. Wall motion was normal;  there were no regional wall  motion abnormalities. - Aortic valve: Valve mobility was restricted. There was severe  stenosis. There was mild regurgitation. Mean gradient (S): 43 mm  Hg. Peak gradient (S): 71 mm Hg. - Aortic root: The aortic root was normal in size. - Ascending aorta: The ascending aorta was normal in size. - Mitral valve: Structurally normal valve. There was mild  regurgitation. - Left atrium: The atrium was normal in size. - Right ventricle: The cavity size was normal. Wall thickness was  normal. Systolic function was normal. - Right atrium: The atrium was mildly dilated. - Tricuspid valve: There was moderate regurgitation. - Pulmonary arteries: Systolic pressure was mildly increased. PA  peak pressure: 42 mm Hg (S). - Inferior vena cava: The vessel was normal in size. - Pericardium, extracardiac: There was no pericardial effusion.  EKG:  EKG is not ordered today but I have reviewed the EKG from 09/06/15. This shows atrial fib, rate in the 40s with non-specific ST and T wave abnormalities.   Recent Labs: 12/12/2014: B Natriuretic Peptide 277.0* 12/14/2014: Magnesium 1.9 08/05/2015: BUN 16; Creat 0.90*; Hemoglobin 13.2; Platelets 215; Potassium 3.8; Sodium 132*   Lipid Panel No results found for: CHOL, TRIG, HDL, CHOLHDL, VLDL, LDLCALC, LDLDIRECT   Wt Readings from Last 3 Encounters:  09/16/15 114 lb 12.8 oz (52.073 kg)  09/06/15 111 lb 3.2 oz (50.44 kg)  07/29/15 117 lb (53.071 kg)    Other studies Reviewed: Additional studies/ records that were reviewed today include: Echo images are personally reviewed by me. Review of the above records demonstrates:   STS Risk Score:  Risk of Mortality: 4.097% Morbidity or Mortality: 22.329% Long Length of Stay: 9.591% Short Length of Stay: 20.333% Permanent Stroke: 2.526% Prolonged Ventilation: 13.159% DSW Infection: 0.111% Renal Failure: 4.793% Reoperation: 9.879%  Assessment and Plan:   1. Severe aortic  valve stenosis: She has stage D symptomatic aortic valve stenosis. I have personally reviewed the echo images. The aortic valve is thickened, calcified with limited leaflet mobility. I think she would benefit from AVR. Given advanced age, she is not a good candidate for conventional AVR by surgical approach. STS risk score 4.09% but clearly she is not an optimal candidate for surgical approach to AVR. I think she may be a good candidate for TAVR. She is a very functional 82 yo patient. I have reviewed the TAVR procedure in detail today with the patient and her family. She would like to proceed with planning for TAVR. I will arrange a right and left heart catheterization at [facility] on 09/26/15. Risks and benefits of procedure reviewed with the patient. After the cath, she will be referred to see one of the CT surgeons on our TAVR team. She will then have Cardiac CT and CTA of the chest/abd/pelvis.   2. Atrial fibrillation: Rate controlled. She  is on Eliquis. This will be held 2 days prior to her cardiac cath.   Current medicines are reviewed at length with the patient today.  The patient does not have concerns regarding medicines.  The following changes have been made:  no change  Labs/ tests ordered today include:  No orders of the defined types were placed in this encounter.    Disposition:   FU with TAVR CT surgeon after cath.    Signed, Lauree Chandler, MD 09/16/2015 2:22 PM    [facility] [place], [place], [place]  [address] Phone: [phone]; Fax: [phone]

## 2015-09-26 NOTE — Discharge Instructions (Signed)
Resume Eliquis tomorrow morning (09/27/15) if no bleeding from cath sites.    Radial Site Care Refer to this sheet in the next few weeks. These instructions provide you with information about caring for yourself after your procedure. Your health care provider may also give you more specific instructions. Your treatment has been planned according to current medical practices, but problems sometimes occur. Call your health care provider if you have any problems or questions after your procedure. WHAT TO EXPECT AFTER THE PROCEDURE After your procedure, it is typical to have the following:  Bruising at the radial site that usually fades within 1-2 weeks.  Blood collecting in the tissue (hematoma) that may be painful to the touch. It should usually decrease in size and tenderness within 1-2 weeks. HOME CARE INSTRUCTIONS  Take medicines only as directed by your health care provider.  You may shower 24-48 hours after the procedure or as directed by your health care provider. Remove the bandage (dressing) and gently wash the site with plain soap and water. Pat the area dry with a clean towel. Do not rub the site, because this may cause bleeding.  Do not take baths, swim, or use a hot tub until your health care provider approves.  Check your insertion site every day for redness, swelling, or drainage.  Do not apply powder or lotion to the site.  Do not flex or bend the affected arm for 24 hours or as directed by your health care provider.  Do not push or pull heavy objects with the affected arm for 24 hours or as directed by your health care provider.  Do not lift over 10 lb (4.5 kg) for 5 days after your procedure or as directed by your health care provider.  Ask your health care provider when it is okay to:  Return to work or school.  Resume usual physical activities or sports.  Resume sexual activity.  Do not drive home if you are discharged the same day as the procedure. Have someone  else drive you.  You may drive 24 hours after the procedure unless otherwise instructed by your health care provider.  Do not operate machinery or power tools for 24 hours after the procedure.  If your procedure was done as an outpatient procedure, which means that you went home the same day as your procedure, a responsible adult should be with you for the first 24 hours after you arrive home.  Keep all follow-up visits as directed by your health care provider. This is important. SEEK MEDICAL CARE IF:  You have a fever.  You have chills.  You have increased bleeding from the radial site. Hold pressure on the site. SEEK IMMEDIATE MEDICAL CARE IF:  You have unusual pain at the radial site.  You have redness, warmth, or swelling at the radial site.  You have drainage (other than a small amount of blood on the dressing) from the radial site.  The radial site is bleeding, and the bleeding does not stop after 30 minutes of holding steady pressure on the site.  Your arm or hand becomes pale, cool, tingly, or numb.   This information is not intended to replace advice given to you by your health care provider. Make sure you discuss any questions you have with your health care provider.   Document Released: 07/14/2010 Document Revised: 07/02/2014 Document Reviewed: 12/28/2013 Elsevier Interactive Patient Education [employer].

## 2015-09-26 NOTE — Interval H&P Note (Signed)
History and Physical Interval Note:  09/26/2015 8:36 AM  Crystal Cooper  has presented today for cardiac cath with the diagnosis of aortic stenosis  The various methods of treatment have been discussed with the patient and family. After consideration of risks, benefits and other options for treatment, the patient has consented to  Procedure(s): Right/Left Heart Cath and Coronary Angiography (N/A) as a surgical intervention .  The patient's history has been reviewed, patient examined, no change in status, stable for surgery.  I have reviewed the patient's chart and labs.  Questions were answered to the patient's satisfaction.     Tommy Minichiello

## 2015-09-28 ENCOUNTER — Encounter: Payer: Self-pay | Admitting: Surgery

## 2015-09-28 ENCOUNTER — Institutional Professional Consult (permissible substitution) (INDEPENDENT_AMBULATORY_CARE_PROVIDER_SITE_OTHER): Payer: Medicare Other | Admitting: Surgery

## 2015-09-28 VITALS — BP 170/75 | HR 54 | Resp 16 | Ht 62.0 in | Wt 112.8 lb

## 2015-09-28 DIAGNOSIS — I482 Chronic atrial fibrillation, unspecified: Secondary | ICD-10-CM

## 2015-09-28 DIAGNOSIS — I350 Nonrheumatic aortic (valve) stenosis: Secondary | ICD-10-CM

## 2015-09-29 ENCOUNTER — Other Ambulatory Visit: Payer: Self-pay | Admitting: *Deleted

## 2015-09-29 DIAGNOSIS — I350 Nonrheumatic aortic (valve) stenosis: Secondary | ICD-10-CM

## 2015-09-30 ENCOUNTER — Encounter: Payer: Self-pay | Admitting: Surgery

## 2015-09-30 NOTE — Progress Notes (Signed)
Patient ID: Crystal Cooper, female   DOB: 11/11/1926, 82 y.o.   MRN: [id number]  [facility] REPORT  Referring Provider is Crenshaw, Denice Bors, MD PCP is Stephens Shire, MD  Chief Complaint  Patient presents with  . Aortic Stenosis    SEVERE..eval for TAVR.Marland KitchenECHO 08/12/15, CATH 09/26/15    HPI:  The patient is an 82 year old woman with a history of hypertension, persistent atrial fibrillation on Eliquis and chronic diastolic heart failure. She has been followed for aortic stenosis for many years by Dr. Stanford Breed. She has had progressive dyspnea over the past 6 months. A recent echo on 08/12/2015 showed normal LV function with severe AS with a mean gradient of 43 mm Hg and an AVA of 0.44 cm2. She underwent cardiac cath on 09/26/2015 showing non-obstructive CAD with severe AS. The mean gradient was measured at 20 mm Hg and the AVA at 0.74 cm2.   She is widowed and lives alone. She is here today with her daughter who lives 6 miles away. Another daughter lives close by. She still takes care of her house.   Past Medical History  Diagnosis Date  . HTN (hypertension)   . Asthma   . Glaucoma   . Hearing loss   . Prolapsing mitral leaflet syndrome   . SVT (supraventricular tachycardia) (HCC)     S/P ablation of AVNRT in 2003  . Aortic stenosis   . Atrial fibrillation [facility])     Past Surgical History  Procedure Laterality Date  . Appendectomy    . Bladder surgery    . Cardiac surgery    . Nasal sinus surgery    . Cardiac catheterization N/A 09/26/2015    Procedure: Right/Left Heart Cath and Coronary Angiography;  Surgeon: Burnell Blanks, MD;  Location: [facility];  Service: Cardiovascular;  Laterality: N/A;    Family History  Problem Relation Age of Onset  . Pneumonia Father     Social History   Social History  . Marital Status: Widowed    Spouse Name: N/A  . Number of Children:  3  . Years of Education: N/A   Occupational History  . Not on file.   Social History Main Topics  . Smoking status: Never Smoker   . Smokeless tobacco: Not on file  . Alcohol Use: No  . Drug Use: No  . Sexual Activity: Not on file   Other Topics Concern  . Not on file   Social History Narrative    Current Outpatient Prescriptions  Medication Sig Dispense Refill  . albuterol (PROVENTIL HFA;VENTOLIN HFA) 108 (90 BASE) MCG/ACT inhaler Inhale 2 puffs into the lungs every 4 (four) hours as needed for wheezing or shortness of breath.     [name] amLODipine (NORVASC) 2.5 MG tablet Take 1 tablet (2.5 mg total) by mouth daily. 30 tablet 0  . apixaban (ELIQUIS) 2.5 MG TABS tablet Take 1 tablet (2.5 mg total) by mouth 2 (two) times daily. 60 tablet 1  . furosemide (LASIX) 40 MG tablet Take 1.5 tablets (60 mg total) by mouth daily. (Patient taking differently: Take 60 mg by mouth daily. Take one and one-half tablets (60mg ) daily.) 45 tablet 6   No current facility-administered medications for this visit.    Allergies  Allergen Reactions  . Hctz [Hydrochlorothiazide] Other (See Comments)    Pt was ill and affected kidneys       Review of  Systems:   General:  normal appetite, decreased energy, no weight gain, no weight loss, no fever  Cardiac:  no chest pain with exertion, no chest pain at rest, moderate SOB with mild exertion, no resting SOB, no PND, no orthopnea, no palpitations, has persistent atrial fibrillation, no LE edema, no dizzy spells, no syncope  Respiratory:  Has exertional shortness of breath, no home oxygen, no productive cough, no dry cough, no bronchitis, no wheezing, no hemoptysis, no asthma, no pain with inspiration or cough, no sleep apnea, no CPAP at night  GI:   no difficulty swallowing, no reflux, no frequent heartburn, no hiatal hernia, no abdominal pain, no constipation, no diarrhea, no hematochezia, no hematemesis, no melena  GU:   no dysuria,  no frequency, no  urinary tract infection, no hematuria,no kidney stones, no kidney disease  Vascular:  no pain suggestive of claudication, no pain in feet, no leg cramps, no varicose veins, no DVT, no non-healing foot ulcer  Neuro:   no stroke, no TIA's, no seizures, no headaches, notemporary blindness one eye,  no slurred speech, no peripheral neuropathy, no chronic pain, no instability of gait, no memory/cognitive dysfunction  Musculoskeletal: no arthritis, no joint swelling, no myalgias, no difficulty walking, normal mobility   Skin:   no rash, no itching, no skin infections, no pressure sores or ulcerations  Psych:   no anxiety, no depression, no nervousness, no unusual recent stress  Eyes:   no blurry vision, no floaters, no recent vision changes,  wears glasses or contacts  ENT:   has hearing loss, no loose or painful teeth, wears dentures  Hematologic:  no easy bruising, no abnormal bleeding, no clotting disorder, no frequent epistaxis  Endocrine:  no diabetes, does not check CBG's at home       Physical Exam:   BP 170/75 mmHg  Pulse 54  Resp 16  Ht 5\' 2"  (1.575 m)  Wt 112 lb 12.8 oz (51.166 kg)  BMI 20.63 kg/m2  SpO2 94%  General:  Elderly but  well-appearing  HEENT:  Unremarkable , NCAT, PERLA, EOMI, oropharynx clar  Neck:   no JVD, no bruits, no adenopathy or thyromegaly  Chest:   clear to auscultation, symmetrical breath sounds, no wheezes, no rhonchi   CV:   RRR, grade III/VI crescendo/decrescendo murmur heard best at RSB,  no diastolic murmur  Abdomen:  soft, non-tender, no masses or organomegaly  Extremities:  warm, well-perfused, pulses palpable in feet, no LE edema  Rectal/GU  Deferred  Neuro:   Grossly non-focal and symmetrical throughout  Skin:   Clean and dry, no rashes, no breakdown   Diagnostic Tests:   [facility]  [address], [place] [address]   [phone]  ------------------------------------------------------------------- Transthoracic Echocardiography  Patient: Carmie, Dehart MR #: [id number] Study Date: 08/12/2015 Gender: F Age: 82 Height: 160 cm Weight: 53.1 kg BSA: 1.54 m^2 Pt. Status: Room:  ORDERING [name] SONOGRAPHER [place], Will ATTENDING Ena Dawley, M.D. PERFORMING [facility], Outpatient  cc:  ------------------------------------------------------------------- LV EF: 60% - 65%  ------------------------------------------------------------------- Indications: (I35.0).  ------------------------------------------------------------------- History: PMH: Acquired from the patient and from the patient&'s chart. Atrial fibrillation. Aortic stenosis. Mitral valve disease. Risk factors: Hypertension.  ------------------------------------------------------------------- Study Conclusions  - Left ventricle: The cavity size was normal. Systolic function was  normal. The estimated ejection fraction was in the range of 60%  to 65%. Wall motion was normal; there were no regional wall  motion abnormalities. - Aortic valve: Valve mobility  was restricted. There was severe  stenosis. There was mild regurgitation. Mean gradient (S): 43 mm  Hg. Peak gradient (S): 71 mm Hg. - Aortic root: The aortic root was normal in size. - Ascending aorta: The ascending aorta was normal in size. - Mitral valve: Structurally normal valve. There was mild  regurgitation. - Left atrium: The atrium was normal in size. - Right ventricle: The cavity size was normal. Wall thickness was  normal. Systolic function was normal. - Right atrium: The atrium was mildly dilated. - Tricuspid valve: There was moderate regurgitation. - Pulmonary arteries: Systolic pressure was mildly increased. PA  peak pressure: 42 mm Hg (S). - Inferior vena cava: The vessel  was normal in size. - Pericardium, extracardiac: There was no pericardial effusion.  Transthoracic echocardiography. M-mode, complete 2D, spectral Doppler, and color Doppler. Birthdate: Patient birthdate: 08/09/1926. Age: Patient is 82 yr old. Sex: Gender: female. BMI: 20.7 kg/m^2. Blood pressure: 150/72 Patient status: Outpatient. Study date: Study date: 08/12/2015. Study time: 03:12 PM. Location: [facility]  -------------------------------------------------------------------  ------------------------------------------------------------------- Left ventricle: The cavity size was normal. Systolic function was normal. The estimated ejection fraction was in the range of 60% to 65%. Wall motion was normal; there were no regional wall motion abnormalities. The study was not technically sufficient to allow evaluation of LV diastolic dysfunction due to atrial fibrillation.  ------------------------------------------------------------------- Aortic valve: Trileaflet; severely thickened, severely calcified leaflets. Valve mobility was restricted. Doppler: There was severe stenosis. There was mild regurgitation. VTI ratio of LVOT to aortic valve: 0.14. Valve area (VTI): 0.45 cm^2. Indexed valve area (VTI): 0.29 cm^2/m^2. Peak velocity ratio of LVOT to aortic valve: 0.14. Valve area (Vmax): 0.44 cm^2. Indexed valve area (Vmax): 0.29 cm^2/m^2. Mean velocity ratio of LVOT to aortic valve: 0.14. Valve area (Vmean): 0.45 cm^2. Indexed valve area (Vmean): 0.29 cm^2/m^2. Mean gradient (S): 43 mm Hg. Peak gradient (S): 71 mm Hg.  ------------------------------------------------------------------- Aorta: Aortic root: The aortic root was normal in size. Ascending aorta: The ascending aorta was normal in size.  ------------------------------------------------------------------- Mitral valve: Structurally normal valve. Mobility was not restricted.  Doppler: Transvalvular velocity was within the normal range. There was no evidence for stenosis. There was mild regurgitation. Peak gradient (D): 7 mm Hg.  ------------------------------------------------------------------- Left atrium: The atrium was normal in size.  ------------------------------------------------------------------- Right ventricle: The cavity size was normal. Wall thickness was normal. Systolic function was normal.  ------------------------------------------------------------------- Pulmonic valve: Structurally normal valve. Cusp separation was normal. Doppler: Transvalvular velocity was within the normal range. There was no evidence for stenosis. There was no regurgitation.  ------------------------------------------------------------------- Tricuspid valve: Structurally normal valve. Doppler: Transvalvular velocity was within the normal range. There was moderate regurgitation.  ------------------------------------------------------------------- Pulmonary artery: The main pulmonary artery was normal-sized. Systolic pressure was mildly increased.  ------------------------------------------------------------------- Right atrium: The atrium was mildly dilated.  ------------------------------------------------------------------- Pericardium: There was no pericardial effusion.  ------------------------------------------------------------------- Systemic veins: Inferior vena cava: The vessel was normal in size.  ------------------------------------------------------------------- Measurements  Left ventricle Value Reference LV ID, ED, PLAX chordal (L) 35.5 mm 43 - 52 LV ID, ES, PLAX chordal 25.3 mm 23 - 38 LV fx shortening, PLAX chordal 29 % >=29 LV PW thickness, ED 10.3 mm --------- IVS/LV  PW ratio, ED 1.14 <=1.3 Stroke volume, 2D 47 ml --------- Stroke volume/bsa, 2D 31 ml/m^2 ---------  Ventricular septum Value Reference IVS thickness, ED 11.7 mm ---------  LVOT Value Reference LVOT ID, S 20 mm --------- LVOT area 3.14 cm^2 --------- LVOT ID 20 mm --------- LVOT peak  velocity, S 59.3 cm/s --------- LVOT mean velocity, S 40.6 cm/s --------- LVOT VTI, S 15.1 cm --------- LVOT peak gradient, S 1 mm Hg --------- Stroke volume (SV), LVOT DP 47.4 ml --------- Stroke index (SV/bsa), LVOT DP 30.9 ml/m^2 ---------  Aortic valve Value Reference Aortic valve peak velocity, S 420 cm/s --------- Aortic valve mean velocity, S 282 cm/s --------- Aortic valve VTI, S 106 cm --------- Aortic mean gradient, S 36 mm Hg --------- Aortic peak gradient, S 71 mm Hg --------- VTI ratio, LVOT/AV 0.14 --------- Aortic valve area, VTI 0.45 cm^2 --------- Aortic valve area/bsa, VTI 0.29 cm^2/m^2 --------- Velocity ratio, peak, LVOT/AV 0.14 --------- Aortic valve area, peak velocity 0.44 cm^2 --------- Aortic valve area/bsa, peak 0.29 cm^2/m^2 --------- velocity Velocity ratio, mean, LVOT/AV 0.14  --------- Aortic valve area, mean velocity 0.45 cm^2 --------- Aortic valve area/bsa, mean 0.29 cm^2/m^2 --------- velocity  Aorta Value Reference Aortic root ID, ED 32 mm --------- Ascending aorta ID, A-P, S 32 mm ---------  Left atrium Value Reference LA ID, A-P, ES 30 mm --------- LA ID/bsa, A-P 1.95 cm/m^2 <=2.2 LA volume, ES, 1-p A4C 48 ml --------- LA volume/bsa, ES, 1-p A4C 31.3 ml/m^2 --------- LA volume, ES, 1-p A2C 64 ml --------- LA volume/bsa, ES, 1-p A2C 41.7 ml/m^2 ---------  Mitral valve Value Reference Mitral E-wave peak velocity 130 cm/s --------- Mitral deceleration time 211 ms 150 - 230 Mitral peak gradient, D 7 mm Hg ---------  Pulmonary arteries Value Reference PA pressure, S, DP (H) 42 mm Hg <=30  Tricuspid valve Value Reference Tricuspid regurg peak velocity 311 cm/s --------- Tricuspid peak RV-RA gradient 39 mm Hg ---------  Systemic veins Value Reference Estimated CVP 3 mm Hg ---------  Right ventricle Value Reference RV pressure, S, DP (H) 42 mm Hg <=30  Legend: (L) and (H) mark values outside specified reference range.  ------------------------------------------------------------------- Prepared and Electronically Authenticated  by  Ena Dawley, M.D. 2017-02-17T17:01:38  Burnell Blanks, MD (Primary)      Procedures    Right/Left Heart Cath and Coronary Angiography    Conclusion     Prox RCA to Mid RCA lesion, 10% stenosed.  Ost LAD to Prox LAD lesion, 10% stenosed.  Prox LAD to Mid LAD lesion, 30% stenosed.  1. Mild non-obstructive CAD 2. Severe aortic valve stenosis (mean gradient 20.4 mm Hg, AVA 0.74 cm2). The valve appears to be heavily calcified.   Recommendations: Will continue with planning for TAVR. She will see our surgeons and CT scans will be arranged. Resume Eliquis tomorrow if no bleeding from cath access sites.      Indications    Severe aortic stenosis [I35.0 (ICD-10-CM)]    Technique and Indications    Estimated blood loss <50 mL. Indication: 82 yo female with history of severe AS here today for right and left heart cath for workup prior to TAVR.   Procedure: The risks, benefits, complications, treatment options, and expected outcomes were discussed with the patient. The patient and/or family concurred with the proposed plan, giving informed consent. The patient was brought to the cath lab after IV hydration was begun and oral premedication was given. The patient was further sedated with Versed and Fentanyl. There was an IV catheter present in the right antecubital vein. This area was prepped and draped. I then changed this out for a 5 [place] sheath. A right heart catheterization was performed with a balloon tipped catheter. The right wrist was assessed with a modified Allens test which was positive. The right wrist  was prepped and draped in a sterile fashion. 1% lidocaine was used for local anesthesia. Using the modified Seldinger access technique, a 5 French sheath was placed in the right radial artery. 3 mg Verapamil was given through the sheath. 3000 units IV heparin was given. Standard diagnostic catheters were used to perform selective coronary angiography. I crossed  the aortic valve with an AL-1 catheter and a straight wire. LV pressures measured. No LV gram performed. The sheath was removed from the right radial artery and a Terumo hemostasis band was applied at the arteriotomy site on the right wrist.   Sedation: During this procedure the patient is administered a total of Versed 1 mg and Fentanyl 25 mcg to achieve and maintain moderate conscious sedation. The patient's heart rate, blood pressure, and oxygen saturation are monitored continuously during the procedure. The period of conscious sedation is 27 minutes, of which I was present face-to-face 100% of this time.    Coronary Findings    Dominance: Right   Left Anterior Descending   . Ost LAD to Prox LAD lesion, 10% stenosed. Calcified diffuse.   . Prox LAD to Mid LAD lesion, 30% stenosed. Calcified diffuse.   . First Diagonal Branch   The vessel is moderate in size and is angiographically normal.   . Second Diagonal Branch   The vessel is small in size.   . Third Diagonal Branch   The vessel is small in size.     Left Circumflex  . Vessel is moderate in size. Vessel is angiographically normal.   . Second Obtuse Marginal Branch   The vessel is moderate in size and is angiographically normal.     Right Coronary Artery  . Vessel is large.   . Prox RCA to Mid RCA lesion, 10% stenosed. Diffuse.   . Right Posterior Descending Artery   The vessel is large in size.   . Right Posterior Atrioventricular Branch   The vessel is moderate in size.       Right Heart Pressures Elevated LV EDP consistent with volume overload.    Left Heart    Aortic Valve There is severe aortic valve stenosis. The aortic valve is calcified.    Coronary Diagrams    Diagnostic Diagram            Implants     No implant documentation for this case.    PACS Images    Show images for Cardiac catheterization     Link to Procedure Log    Procedure Log      Hemo Data       Most Recent  Value   Fick Cardiac Output  3.37 L/min   Fick Cardiac Output Index  2.25 (L/min)/BSA   Aortic Mean Gradient  20.4 mmHg   Aortic Peak Gradient  17 mmHg   Aortic Valve Area  0.74   Aortic Value Area Index  0.49 cm2/BSA   RA A Wave  6 mmHg   RA V Wave  6 mmHg   RA Mean  5 mmHg   RV Systolic Pressure  47 mmHg   RV Diastolic Pressure  0 mmHg   RV EDP  5 mmHg   PA Systolic Pressure  43 mmHg   PA Diastolic Pressure  11 mmHg   PA Mean  24 mmHg   PW A Wave  17 mmHg   PW V Wave  26 mmHg   PW Mean  14 mmHg   AO Systolic Pressure  [id number] mmHg  AO Diastolic Pressure  54 mmHg   AO Mean  83 mmHg   LV Systolic Pressure  123XX123 mmHg   LV Diastolic Pressure  5 mmHg   LV EDP  14 mmHg   Arterial Occlusion Pressure Extended Systolic Pressure  [id number] mmHg   Arterial Occlusion Pressure Extended Diastolic Pressure  50 mmHg   Arterial Occlusion Pressure Extended Mean Pressure  77 mmHg   Left Ventricular Apex Extended Systolic Pressure  123XX123 mmHg   Left Ventricular Apex Extended Diastolic Pressure  20 mmHg   Left Ventricular Apex Extended EDP Pressure  43 mmHg   QP/QS  1   TPVR Index  10.69 HRUI   TSVR Index  36.98 HRUI   PVR SVR Ratio  0.13   TPVR/TSVR Ratio  0.29    STS Risk Score: AVR  Risk of Mortality: 4.097% Morbidity or Mortality: 22.329% Long Length of Stay: 9.591% Short Length of Stay: 20.333% Permanent Stroke: 2.526% Prolonged Ventilation: 13.159% DSW Infection: 0.111% Renal Failure: 4.793% Reoperation: 9.879%  Impression:  This 82 year old woman has stage D severe symptomatic aortic stenosis with a trileaflet, severely thickened and calcified valve with restricted mobility and a mean gradient of 43 mm Hg. Her cath shows no significant coronary disease. She has progressive exertional shortness of breath and fatigue due to diastolic congestive heart failure with NYHA class II symptoms. I think AVR is indicated. Her risk for open surgical AVR is at least  moderately elevated due to her advanced age and atrial fibrillation. I think TAVR is a reasonable alternative.   The patient and her daughter were counseled at length regarding treatment alternatives for management of severe symptomatic aortic stenosis. Alternative approaches such as conventional aortic valve replacement, transcatheter aortic valve replacement, and palliative medical therapy were compared and contrasted at length. The risks associated with conventional surgical aortic valve replacement were been discussed in detail, as were expectations for post-operative convalescence. Long-term prognosis with medical therapy was discussed. She would like to proceed with further TAVR evaluation.   Plan:  She will be scheduled for a gated cardiac CT, CTA of the chest, abdomen, and pelvis and will have PT evaluation and PFT's. She will then return to see Dr. Roxy Manns for a second surgical evaluation.    Gaye Pollack, MD 09/28/2015

## 2015-10-10 ENCOUNTER — Encounter: Payer: Self-pay | Admitting: Physical Therapy

## 2015-10-10 ENCOUNTER — Ambulatory Visit: Payer: Medicare Other | Attending: Surgery | Admitting: Physical Therapy

## 2015-10-10 DIAGNOSIS — R293 Abnormal posture: Secondary | ICD-10-CM | POA: Insufficient documentation

## 2015-10-10 DIAGNOSIS — R2689 Other abnormalities of gait and mobility: Secondary | ICD-10-CM | POA: Insufficient documentation

## 2015-10-10 DIAGNOSIS — M6281 Muscle weakness (generalized): Secondary | ICD-10-CM | POA: Diagnosis not present

## 2015-10-10 NOTE — Therapy (Signed)
[place], [place], [address] Phone: [phone]   Fax:  [phone]  Physical Therapy Evaluation  Patient Details  Name: Crystal Cooper MRN: [id number] Date of Birth: [birth year] Referring Provider: Dr. Gilford Raid  Encounter Date: 10/10/2015      PT End of Session - 10/10/15 1503    Visit Number 1   PT Start Time 1501   PT Stop Time 1550   PT Time Calculation (min) 49 min   Activity Tolerance Patient tolerated treatment well      Past Medical History  Diagnosis Date  . HTN (hypertension)   . Asthma   . Glaucoma   . Hearing loss   . Prolapsing mitral leaflet syndrome   . SVT (supraventricular tachycardia) (HCC)     S/P ablation of AVNRT in 2003  . Aortic stenosis   . Atrial fibrillation [facility])     Past Surgical History  Procedure Laterality Date  . Appendectomy    . Bladder surgery    . Cardiac surgery    . Nasal sinus surgery    . Cardiac catheterization N/A 09/26/2015    Procedure: Right/Left Heart Cath and Coronary Angiography;  Surgeon: Burnell Blanks, MD;  Location: [facility];  Service: Cardiovascular;  Laterality: N/A;    There were no vitals filed for this visit.       Subjective Assessment - 10/10/15 1504    Subjective notice increased SOB over past 6+months which has caused a general slowing down of mobility and more rest breaks, daughters now helping with deep cleaning, reports some chest tightness with SOB   Patient Stated Goals to improve mobility   Currently in Pain? No/denies            [facility] PT Assessment - 10/10/15 0001    Assessment   Medical Diagnosis severe aortic stenosis   Referring Provider Dr. Gilford Raid   Onset Date/Surgical Date 09/28/15   Precautions   Precautions None   Restrictions   Weight Bearing Restrictions No   Balance Screen   Has the patient fallen in the past 6 months No   Has the patient had a decrease in activity  level because of a fear of falling?  No   Is the patient reluctant to leave their home because of a fear of falling?  No   Home Environment   Living Environment Private residence   Home Access Stairs to enter   Entrance Stairs-Number of Steps [place] One level   Prior Function   Level of Independence Independent with household mobility without device;Independent with community mobility with device   Posture/Postural Control   Posture/Postural Control Postural limitations   Postural Limitations Rounded Shoulders;Forward head   ROM / Strength   AROM / PROM / Strength AROM;Strength   Strength   Overall Strength Comments grossly 3+/5 throughout UE and 4-/5 throughout lower extremity   Strength Assessment Site Hand   Right/Left hand Right;Left   Right Hand Grip (lbs) 21  R hand dominant   Left Hand Grip (lbs) 21   Ambulation/Gait   Assistive device Straight cane  with quad support base attached (like hurry cane)   Gait Pattern Decreased step length - right;Decreased step length - left;Trunk flexed  mild unsteadiness but no LOB          [facility] Pre-Surgical Assessment - 10/10/15 0001    5 Meter Walk Test- trial 1 13.3  sec   5 Meter Walk Test- trial 2 14 sec.    5 Meter Walk Test- trial 3 15 sec.  >/= 6 sec considered slow speed   5 meter walk test average 14.1 sec   Timed Up & Go Test trial  26.6 sec.  with single point cane   Comments >/= 12 sec indicates increased fall risk   4 Stage Balance Test tolerated for:  10 sec.   4 Stage Balance Test Position 2   comment inability to hold position 3 x 10 sec indicative of increased fall risk   Sit To Stand Test- trial 1 25.5 sec.   Comment </= 14.8 sec WNL   ADL/IADL Independent with: Bathing;Dressing;Meal prep;Finances   ADL/IADL Needs Assistance with: Valla Leaver work   ADL/IADL Fraility Index Vulnerable   6 Minute Walk- Baseline yes   BP (mmHg) 118/68 mmHg   HR (bpm) 54   02 Sat (%RA) 98 %    Modified Borg Scale for Dyspnea 0- Nothing at all   Perceived Rate of Exertion (Borg) 6-   6 Minute Walk Post Test yes   BP (mmHg) 158/78 mmHg   HR (bpm) 80   02 Sat (%RA) 94 %   Modified Borg Scale for Dyspnea 1- Very mild shortness of breath   Perceived Rate of Exertion (Borg) 17- Very hard   Aerobic Endurance Distance Walked 410   Endurance additional comments No seated rest breaks, slow steady pace, utilized single point cane and supervision due to mild unsteadiness noted                         PT Education - 10/10/15 1613    Education provided Yes   Education Details fall risk/continued use of cane   Person(s) Educated Patient;Child(ren)   Methods Explanation   Comprehension Verbalized understanding                    Plan - 10/10/15 1613    Clinical Impression Statement Pt is an 82 yo female presenting to O[facility] for evaluation prior to possible TAVR surgery due to severe aortic stenosis. Pt is present with daughter. Pt reports a longstanding cardiac history which was mostely assymptomatic up until about 6 months ago when exertional SOB started. Pt also reports some chest tightess at times with activity. Since bout with CHF last year, pt has relied more on daughters/son-in-law for assist with groceries, heavy cleaning, and yardwork. Pt utilizes single point cane in community and no device in household. Pt presents with generalized muscle weakness throughout UE and lower extremities, good ROM, fair balance with increased fall risk per 4 stage balance test and timed up and go, and moderate to severe limitations in 6 minute walk test/aerobic endurance in 6 minute walk test. Pt able to ambulate at slow steady pace continuously for 6 minutes but only walked a total of 410'. BP increased from 118/68 at rest to 158/78 after 6 minute walk.    PT Frequency One time visit   Consulted and Agree with Plan of Care Patient;Family member/caregiver      Patient was found  to have the following deficits and impairments:     Visit Diagnosis: Muscle weakness (generalized) - Plan: PT plan of care cert/re-cert  Other abnormalities of gait and mobility - Plan: PT plan of care cert/re-cert  Abnormal posture - Plan: PT plan of care cert/re-cert      G-Codes - [phone]    Functional  Assessment Tool Used 6 minute walk 410' with single point cane   Functional Limitation Mobility: Walking and moving around   Mobility: Walking and Moving Around Current Status [phone]) At least 60 percent but less than 80 percent impaired, limited or restricted   Mobility: Walking and Moving Around Goal Status [phone]) At least 60 percent but less than 80 percent impaired, limited or restricted   Mobility: Walking and Moving Around Discharge Status [phone]) At least 60 percent but less than 80 percent impaired, limited or restricted       Problem List Patient Active Problem List   Diagnosis Date Noted  . Atrial fibrillation ([place]) 09/06/2015  . Hyponatremia 12/17/2014  . Atrial flutter ([place]) 12/14/2014  . CHF (congestive heart failure) ([place]) 12/12/2014  . Aortic stenosis 11/13/2013  . Murmur 10/19/2011  . Chest pain 10/19/2011  . Hypertension 10/19/2011  . SVT (supraventricular tachycardia) ([place]) 10/19/2011    Dago Jungwirth, PT 10/10/2015, 4:25 PM  [facility] [address] [place], [place], [address] Phone: [phone]   Fax:  [phone]  Name: Crystal Cooper MRN: [id number] Date of Birth: March 20, 1927

## 2015-10-14 ENCOUNTER — Encounter: Payer: Self-pay | Admitting: Thoracic Surgery (Cardiothoracic Vascular Surgery)

## 2015-10-14 ENCOUNTER — Institutional Professional Consult (permissible substitution) (INDEPENDENT_AMBULATORY_CARE_PROVIDER_SITE_OTHER): Payer: Medicare Other | Admitting: Thoracic Surgery (Cardiothoracic Vascular Surgery)

## 2015-10-14 ENCOUNTER — Ambulatory Visit (HOSPITAL_COMMUNITY)
Admission: RE | Admit: 2015-10-14 | Discharge: 2015-10-14 | Disposition: A | Discharge status: Home / Self Care | Payer: Medicare Other | Source: Ambulatory Visit | Attending: Surgery | Admitting: Surgery

## 2015-10-14 ENCOUNTER — Ambulatory Visit (HOSPITAL_COMMUNITY): Payer: Medicare Other

## 2015-10-14 ENCOUNTER — Other Ambulatory Visit: Payer: Self-pay | Admitting: *Deleted

## 2015-10-14 VITALS — BP 118/64 | HR 61 | Resp 16 | Ht 62.0 in | Wt 111.0 lb

## 2015-10-14 DIAGNOSIS — I358 Other nonrheumatic aortic valve disorders: Secondary | ICD-10-CM | POA: Diagnosis not present

## 2015-10-14 DIAGNOSIS — N6489 Other specified disorders of breast: Secondary | ICD-10-CM | POA: Diagnosis not present

## 2015-10-14 DIAGNOSIS — R918 Other nonspecific abnormal finding of lung field: Secondary | ICD-10-CM | POA: Diagnosis not present

## 2015-10-14 DIAGNOSIS — I482 Chronic atrial fibrillation, unspecified: Secondary | ICD-10-CM

## 2015-10-14 DIAGNOSIS — I350 Nonrheumatic aortic (valve) stenosis: Secondary | ICD-10-CM | POA: Insufficient documentation

## 2015-10-14 DIAGNOSIS — I5032 Chronic diastolic (congestive) heart failure: Secondary | ICD-10-CM | POA: Diagnosis not present

## 2015-10-14 DIAGNOSIS — R942 Abnormal results of pulmonary function studies: Secondary | ICD-10-CM | POA: Diagnosis not present

## 2015-10-14 DIAGNOSIS — J449 Chronic obstructive pulmonary disease, unspecified: Secondary | ICD-10-CM | POA: Insufficient documentation

## 2015-10-14 DIAGNOSIS — I712 Thoracic aortic aneurysm, without rupture: Secondary | ICD-10-CM | POA: Diagnosis not present

## 2015-10-14 LAB — PULMONARY FUNCTION TEST
FEF 25-75 PRE: 0.33 L/s
FEF 25-75 Post: 0.6 L/sec
FEF2575-%Change-Post: 80 %
FEF2575-%Pred-Post: 64 %
FEF2575-%Pred-Pre: 35 %
FEV1-%Change-Post: 24 %
FEV1-%PRED-POST: 61 %
FEV1-%PRED-PRE: 49 %
FEV1-POST: 0.92 L
FEV1-Pre: 0.74 L
FEV1FVC-%Change-Post: -1 %
FEV1FVC-%Pred-Pre: 80 %
FEV6-%CHANGE-POST: 28 %
FEV6-%PRED-POST: 83 %
FEV6-%PRED-PRE: 64 %
FEV6-POST: 1.6 L
FEV6-PRE: 1.25 L
FEV6FVC-%CHANGE-POST: 0 %
FEV6FVC-%PRED-POST: 105 %
FEV6FVC-%PRED-PRE: 104 %
FVC-%CHANGE-POST: 27 %
FVC-%PRED-POST: 79 %
FVC-%Pred-Pre: 62 %
FVC-Post: 1.63 L
FVC-Pre: 1.28 L
POST FEV1/FVC RATIO: 57 %
POST FEV6/FVC RATIO: 98 %
Pre FEV1/FVC ratio: 58 %
Pre FEV6/FVC Ratio: 97 %
RV % PRED: 103 %
RV: 2.51 L
TLC % PRED: 91 %
TLC: 4.37 L

## 2015-10-14 MED ORDER — IOPAMIDOL (ISOVUE-370) INJECTION 76%
INTRAVENOUS | Status: AC
  Administered 2015-10-14: 70 mL via INTRAVENOUS
  Filled 2015-10-14: qty 50

## 2015-10-14 MED ORDER — ALBUTEROL SULFATE (2.5 MG/3ML) 0.083% IN NEBU
2.5000 mg | INHALATION_SOLUTION | Freq: Once | RESPIRATORY_TRACT | Status: AC
  Administered 2015-10-14: 2.5 mg via RESPIRATORY_TRACT

## 2015-10-14 MED ORDER — IOPAMIDOL (ISOVUE-370) INJECTION 76%
INTRAVENOUS | Status: AC
  Administered 2015-10-14: 80 mL
  Filled 2015-10-14: qty 100

## 2015-10-14 NOTE — Progress Notes (Signed)
[facility]  MULTIDISCIPLINARY HEART VALVE CLINIC  CARDIOTHORACIC SURGERY CONSULTATION REPORT  Referring Provider is Crenshaw, Denice Bors, MD PCP is Stephens Shire, MD  Chief Complaint  Patient presents with  . Aortic Stenosis    2ND TAVR EVAL after consult with Dr. Cyndia Bent on 09/30/15...review all studies    HPI:  Patient is an 82 year old female with history of aortic stenosis, atrial fibrillation, SVT status post ablation in 2003, hypertension, and chronic diastolic congestive heart failure who has been referred for a second surgical opinion to discuss treatment options for management of severe symptomatic aortic stenosis. The patient's cardiac history dates back nearly 20 years ago when she first presented with atrial tachycardia. 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 She has been followed for several years by Dr. Stanford Breed. Echocardiograms have demonstrated a gradual progression of severity of aortic stenosis. In July 2016 the patient was hospitalized with an acute exacerbation of chronic diastolic congestive heart failure. During that admission she developed atrial flutter and atrial fibrillation. She was started on Eliquis for anticoagulation at that time.  Follow-up transthoracic echocardiogram performed 08/12/2015 reversed revealed further progression and the severity of aortic stenosis with peak velocity across the aortic valve measured 4.2 m/s corresponding to mean transvalvular gradient estimated 36 mmHg.  Left ventricular systolic function remains preserved with ejection fraction estimated 60-65%. She was referred to Dr. Angelena Form for consultation and subsequently underwent left and right heart catheterization on 09/26/2015. She is found to have mild nonobstructive coronary artery disease. Mean transvalvular gradient measured at catheterization was 20.4 mmHg corresponding to aortic valve area calculated 0.74 cm. The patient  was referred for surgical consultation and initially evaluated by Dr. Cyndia Bent.  She was felt to be at least moderate risk for conventional surgical aortic valve replacement and transcatheter aortic valve replacement was discussed as an alternative. The patient has subsequent undergone CT angiography to characterize the feasibility of transcatheter aortic valve replacement. The patient has been referred for a second surgical opinion.  The patient is widowed and lives alone in a private home in [place].  She has 2 adult daughters, both of whom live reasonably close by. The patient remains functionally independent. She still drives and automobile on a limited basis. She does her own cooking, cleaning, and simple chores around the house. She complains of progressive exertional fatigue and exertional shortness of breath. Symptoms have been slowly progressive for quite some time. She now occasionally gets short of breath with minimal activity and at rest. Oftentimes she will wake up in the middle night feeling as though she has smothering and will get up to sit in a chair. She has occasional episodes of pressure across her chest associated with shortness of breath. She has had some mild transient dizzy spells when she stands up from a sitting position. She has not had any syncopal episodes. Her mobility and functional status is limited primarily by fatigue and exertional shortness of breath. She does use a cane to stabilize herself with walking. She has some arthritis but minimal pain.   Past Medical History  Diagnosis Date  . HTN (hypertension)   . Asthma   . Glaucoma   . Hearing loss   . Prolapsing mitral leaflet syndrome   . SVT (supraventricular tachycardia) (HCC)     S/P ablation of AVNRT in 2003  . Aortic stenosis   . Atrial fibrillation [facility])     Past Surgical History  Procedure Laterality Date  . Appendectomy    .  Bladder surgery    . Cardiac surgery    . Nasal sinus surgery    . Cardiac  catheterization N/A 09/26/2015    Procedure: Right/Left Heart Cath and Coronary Angiography;  Surgeon: Burnell Blanks, MD;  Location: [facility];  Service: Cardiovascular;  Laterality: N/A;    Family History  Problem Relation Age of Onset  . Pneumonia Father     Social History   Social History  . Marital Status: Widowed    Spouse Name: N/A  . Number of Children: 3  . Years of Education: N/A   Occupational History  . Not on file.   Social History Main Topics  . Smoking status: Never Smoker   . Smokeless tobacco: Not on file  . Alcohol Use: No  . Drug Use: No  . Sexual Activity: Not on file   Other Topics Concern  . Not on file   Social History Narrative    Current Outpatient Prescriptions  Medication Sig Dispense Refill  . albuterol (PROVENTIL HFA;VENTOLIN HFA) 108 (90 BASE) MCG/ACT inhaler Inhale 2 puffs into the lungs every 4 (four) hours as needed for wheezing or shortness of breath.     [name] amLODipine (NORVASC) 2.5 MG tablet Take 1 tablet (2.5 mg total) by mouth daily. 30 tablet 0  . apixaban (ELIQUIS) 2.5 MG TABS tablet Take 1 tablet (2.5 mg total) by mouth 2 (two) times daily. 60 tablet 1  . furosemide (LASIX) 40 MG tablet Take 1.5 tablets (60 mg total) by mouth daily. (Patient taking differently: Take 60 mg by mouth daily. Take one and one-half tablets (60mg ) daily.) 45 tablet 6   No current facility-administered medications for this visit.    Allergies  Allergen Reactions  . Hctz [Hydrochlorothiazide] Other (See Comments)    Pt was ill and affected kidneys       Review of Systems:   General:  normal appetite, decreased energy, no weight gain, no weight loss, no fever  Cardiac:  + chest pain with exertion, no chest pain at rest, + SOB with exertion, occasional resting SOB, + PND, + orthopnea, + palpitations, + arrhythmia, + atrial fibrillation, + LE edema, + dizzy spells, no syncope  Respiratory:  + shortness of breath, no home oxygen, no  productive cough, no dry cough, no bronchitis, no wheezing, no hemoptysis, no asthma, no pain with inspiration or cough, no sleep apnea, no CPAP at night  GI:   no difficulty swallowing, no reflux, no frequent heartburn, no hiatal hernia, no abdominal pain, no constipation, no diarrhea, no hematochezia, no hematemesis, no melena  GU:   no dysuria,  no frequency, no urinary tract infection, no hematuria, no kidney stones, no kidney disease  Vascular:  no pain suggestive of claudication, no pain in feet, no leg cramps, + varicose veins, no DVT, no non-healing foot ulcer  Neuro:   no stroke, no TIA's, no seizures, no headaches, no temporary blindness one eye,  no slurred speech, no peripheral neuropathy, no chronic pain, mild instability of gait, no memory/cognitive dysfunction  Musculoskeletal: + arthritis, + joint swelling, no myalgias, minimal difficulty walking, slightly decreased mobility   Skin:   no rash, no itching, no skin infections, no pressure sores or ulcerations  Psych:   no anxiety, no depression, no nervousness, no unusual recent stress  Eyes:   no blurry vision, no floaters, no recent vision changes, + wears glasses or contacts  ENT:   no hearing loss, no loose or painful teeth, edentulous with  full set dentures  Hematologic:  + easy bruising, no abnormal bleeding, no clotting disorder, no frequent epistaxis  Endocrine:  no diabetes, does not check CBG's at home           Physical Exam:   BP 118/64 mmHg  Pulse 61  Resp 16  Ht 5\' 2"  (1.575 m)  Wt 111 lb (50.349 kg)  BMI 20.30 kg/m2  General:  Elderly and frail-appearing  HEENT:  Unremarkable   Neck:   no JVD, no bruits, no adenopathy   Chest:   clear to auscultation, symmetrical breath sounds, no wheezes, no rhonchi   CV:   RRR, grade IV/VI crescendo/decrescendo murmur heard best at RSB,  no diastolic murmur  Abdomen:  soft, non-tender, no masses   Extremities:  warm, well-perfused, pulses palpable, + LE  edema  Rectal/GU  Deferred  Neuro:   Grossly non-focal and symmetrical throughout  Skin:   Clean and dry, no rashes, no breakdown   Diagnostic Tests:  Transthoracic Echocardiography  Patient: Ladaija, Vaccarelli MR #: [id number] Study Date: 08/12/2015 Gender: F Age: 38 Height: 160 cm Weight: 53.1 kg BSA: 1.54 m^2 Pt. Status: Room:  ORDERING [name] SONOGRAPHER Sumas, Will ATTENDING Ena Dawley, M.D. PERFORMING [facility], Outpatient  cc:  ------------------------------------------------------------------- LV EF: 60% - 65%  ------------------------------------------------------------------- Indications: (I35.0).  ------------------------------------------------------------------- History: PMH: Acquired from the patient and from the patient&'s chart. Atrial fibrillation. Aortic stenosis. Mitral valve disease. Risk factors: Hypertension.  ------------------------------------------------------------------- Study Conclusions  - Left ventricle: The cavity size was normal. Systolic function was  normal. The estimated ejection fraction was in the range of 60%  to 65%. Wall motion was normal; there were no regional wall  motion abnormalities. - Aortic valve: Valve mobility was restricted. There was severe  stenosis. There was mild regurgitation. Mean gradient (S): 43 mm  Hg. Peak gradient (S): 71 mm Hg. - Aortic root: The aortic root was normal in size. - Ascending aorta: The ascending aorta was normal in size. - Mitral valve: Structurally normal valve. There was mild  regurgitation. - Left atrium: The atrium was normal in size. - Right ventricle: The cavity size was normal. Wall thickness was  normal. Systolic function was normal. - Right atrium: The atrium was mildly dilated. - Tricuspid valve: There was moderate regurgitation. - Pulmonary arteries: Systolic pressure was mildly  increased. PA  peak pressure: 42 mm Hg (S). - Inferior vena cava: The vessel was normal in size. - Pericardium, extracardiac: There was no pericardial effusion.  Transthoracic echocardiography. M-mode, complete 2D, spectral Doppler, and color Doppler. Birthdate: Patient birthdate: 09-18-1926. Age: Patient is 82 yr old. Sex: Gender: female. BMI: 20.7 kg/m^2. Blood pressure: 150/72 Patient status: Outpatient. Study date: Study date: 08/12/2015. Study time: 03:12 PM. Location: [facility]  -------------------------------------------------------------------  ------------------------------------------------------------------- Left ventricle: The cavity size was normal. Systolic function was normal. The estimated ejection fraction was in the range of 60% to 65%. Wall motion was normal; there were no regional wall motion abnormalities. The study was not technically sufficient to allow evaluation of LV diastolic dysfunction due to atrial fibrillation.  ------------------------------------------------------------------- Aortic valve: Trileaflet; severely thickened, severely calcified leaflets. Valve mobility was restricted. Doppler: There was severe stenosis. There was mild regurgitation. VTI ratio of LVOT to aortic valve: 0.14. Valve area (VTI): 0.45 cm^2. Indexed valve area (VTI): 0.29 cm^2/m^2. Peak velocity ratio of LVOT to aortic valve: 0.14. Valve area (Vmax): 0.44 cm^2. Indexed valve area (Vmax): 0.29 cm^2/m^2. Mean velocity ratio of LVOT to aortic valve:  0.14. Valve area (Vmean): 0.45 cm^2. Indexed valve area (Vmean): 0.29 cm^2/m^2. Mean gradient (S): 43 mm Hg. Peak gradient (S): 71 mm Hg.  ------------------------------------------------------------------- Aorta: Aortic root: The aortic root was normal in size. Ascending aorta: The ascending aorta was normal in  size.  ------------------------------------------------------------------- Mitral valve: Structurally normal valve. Mobility was not restricted. Doppler: Transvalvular velocity was within the normal range. There was no evidence for stenosis. There was mild regurgitation. Peak gradient (D): 7 mm Hg.  ------------------------------------------------------------------- Left atrium: The atrium was normal in size.  ------------------------------------------------------------------- Right ventricle: The cavity size was normal. Wall thickness was normal. Systolic function was normal.  ------------------------------------------------------------------- Pulmonic valve: Structurally normal valve. Cusp separation was normal. Doppler: Transvalvular velocity was within the normal range. There was no evidence for stenosis. There was no regurgitation.  ------------------------------------------------------------------- Tricuspid valve: Structurally normal valve. Doppler: Transvalvular velocity was within the normal range. There was moderate regurgitation.  ------------------------------------------------------------------- Pulmonary artery: The main pulmonary artery was normal-sized. Systolic pressure was mildly increased.  ------------------------------------------------------------------- Right atrium: The atrium was mildly dilated.  ------------------------------------------------------------------- Pericardium: There was no pericardial effusion.  ------------------------------------------------------------------- Systemic veins: Inferior vena cava: The vessel was normal in size.  ------------------------------------------------------------------- Measurements  Left ventricle Value Reference LV ID, ED, PLAX chordal (L) 35.5 mm 43 - 52 LV ID, ES, PLAX chordal 25.3  mm 23 - 38 LV fx shortening, PLAX chordal 29 % >=29 LV PW thickness, ED 10.3 mm --------- IVS/LV PW ratio, ED 1.14 <=1.3 Stroke volume, 2D 47 ml --------- Stroke volume/bsa, 2D 31 ml/m^2 ---------  Ventricular septum Value Reference IVS thickness, ED 11.7 mm ---------  LVOT Value Reference LVOT ID, S 20 mm --------- LVOT area 3.14 cm^2 --------- LVOT ID 20 mm --------- LVOT peak velocity, S 59.3 cm/s --------- LVOT mean velocity, S 40.6 cm/s --------- LVOT VTI, S 15.1 cm --------- LVOT peak gradient, S 1 mm Hg --------- Stroke volume (SV), LVOT DP 47.4 ml --------- Stroke index (SV/bsa), LVOT DP 30.9 ml/m^2 ---------  Aortic valve Value Reference Aortic valve peak velocity, S 420 cm/s --------- Aortic valve mean velocity, S 282 cm/s --------- Aortic valve VTI, S 106 cm --------- Aortic mean gradient, S 36 mm Hg --------- Aortic peak gradient, S 71 mm Hg --------- VTI ratio, LVOT/AV 0.14 --------- Aortic valve area, VTI 0.45 cm^2 --------- Aortic valve area/bsa, VTI 0.29 cm^2/m^2 --------- Velocity ratio, peak, LVOT/AV 0.14 --------- Aortic valve area, peak velocity 0.44 cm^2  --------- Aortic valve area/bsa, peak 0.29 cm^2/m^2 --------- velocity Velocity ratio, mean, LVOT/AV 0.14 --------- Aortic valve area, mean velocity 0.45 cm^2 --------- Aortic valve area/bsa, mean 0.29 cm^2/m^2 --------- velocity  Aorta Value Reference Aortic root ID, ED 32 mm --------- Ascending aorta ID, A-P, S 32 mm ---------  Left atrium Value Reference LA ID, A-P, ES 30 mm --------- LA ID/bsa, A-P 1.95 cm/m^2 <=2.2 LA volume, ES, 1-p A4C 48 ml --------- LA volume/bsa, ES, 1-p A4C 31.3 ml/m^2 --------- LA volume, ES, 1-p A2C 64 ml --------- LA volume/bsa, ES, 1-p A2C 41.7 ml/m^2 ---------  Mitral valve Value Reference Mitral E-wave peak velocity 130 cm/s --------- Mitral deceleration time 211 ms 150 - 230 Mitral peak gradient, D 7 mm Hg ---------  Pulmonary arteries Value Reference PA pressure, S, DP (H) 42 mm Hg <=30  Tricuspid valve Value Reference Tricuspid regurg peak velocity 311 cm/s --------- Tricuspid peak RV-RA gradient 39 mm Hg ---------  Systemic veins Value Reference Estimated CVP 3 mm Hg ---------  Right ventricle Value Reference RV pressure, S, DP (H) 42  mm Hg <=30  Legend: (L) and (H) mark values  outside specified reference range.  ------------------------------------------------------------------- Prepared and Electronically Authenticated by  Ena Dawley, M.D. 2017-02-17T17:01:38       CARDIAC CATHETERIZATION Procedures    Right/Left Heart Cath and Coronary Angiography    Conclusion     Prox RCA to Mid RCA lesion, 10% stenosed.  Ost LAD to Prox LAD lesion, 10% stenosed.  Prox LAD to Mid LAD lesion, 30% stenosed.  1. Mild non-obstructive CAD 2. Severe aortic valve stenosis (mean gradient 20.4 mm Hg, AVA 0.74 cm2). The valve appears to be heavily calcified.   Recommendations: Will continue with planning for TAVR. She will see our surgeons and CT scans will be arranged. Resume Eliquis tomorrow if no bleeding from cath access sites.      Indications    Severe aortic stenosis [I35.0 (ICD-10-CM)]    Technique and Indications    Estimated blood loss <50 mL. Indication: 82 yo female with history of severe AS here today for right and left heart cath for workup prior to TAVR.   Procedure: The risks, benefits, complications, treatment options, and expected outcomes were discussed with the patient. The patient and/or family concurred with the proposed plan, giving informed consent. The patient was brought to the cath lab after IV hydration was begun and oral premedication was given. The patient was further sedated with Versed and Fentanyl. There was an IV catheter present in the right antecubital vein. This area was prepped and draped. I then changed this out for a 5 [place] sheath. A right heart catheterization was performed with a balloon tipped catheter. The right wrist was assessed with a modified Allens test which was positive. The right wrist was prepped and draped in a sterile fashion. 1% lidocaine was used for local anesthesia. Using the modified Seldinger access technique, a 5 French sheath was placed in the right radial artery. 3 mg Verapamil was given  through the sheath. 3000 units IV heparin was given. Standard diagnostic catheters were used to perform selective coronary angiography. I crossed the aortic valve with an AL-1 catheter and a straight wire. LV pressures measured. No LV gram performed. The sheath was removed from the right radial artery and a Terumo hemostasis band was applied at the arteriotomy site on the right wrist.   Sedation: During this procedure the patient is administered a total of Versed 1 mg and Fentanyl 25 mcg to achieve and maintain moderate conscious sedation. The patient's heart rate, blood pressure, and oxygen saturation are monitored continuously during the procedure. The period of conscious sedation is 27 minutes, of which I was present face-to-face 100% of this time.    Coronary Findings    Dominance: Right   Left Anterior Descending   . Ost LAD to Prox LAD lesion, 10% stenosed. Calcified diffuse.   . Prox LAD to Mid LAD lesion, 30% stenosed. Calcified diffuse.   . First Diagonal Branch   The vessel is moderate in size and is angiographically normal.   . Second Diagonal Branch   The vessel is small in size.   . Third Diagonal Branch   The vessel is small in size.     Left Circumflex  . Vessel is moderate in size. Vessel is angiographically normal.   . Second Obtuse Marginal Branch   The vessel is moderate in size and is angiographically normal.     Right Coronary Artery  . Vessel is large.   . Prox RCA to Mid RCA lesion, 10%  stenosed. Diffuse.   . Right Posterior Descending Artery   The vessel is large in size.   . Right Posterior Atrioventricular Branch   The vessel is moderate in size.       Right Heart Pressures Elevated LV EDP consistent with volume overload.    Left Heart    Aortic Valve There is severe aortic valve stenosis. The aortic valve is calcified.    Coronary Diagrams    Diagnostic Diagram            Implants     No implant documentation for this case.     PACS Images    Show images for Cardiac catheterization     Link to Procedure Log    Procedure Log      Hemo Data       Most Recent Value   Fick Cardiac Output  3.37 L/min   Fick Cardiac Output Index  2.25 (L/min)/BSA   Aortic Mean Gradient  20.4 mmHg   Aortic Peak Gradient  17 mmHg   Aortic Valve Area  0.74   Aortic Value Area Index  0.49 cm2/BSA   RA A Wave  6 mmHg   RA V Wave  6 mmHg   RA Mean  5 mmHg   RV Systolic Pressure  47 mmHg   RV Diastolic Pressure  0 mmHg   RV EDP  5 mmHg   PA Systolic Pressure  43 mmHg   PA Diastolic Pressure  11 mmHg   PA Mean  24 mmHg   PW A Wave  17 mmHg   PW V Wave  26 mmHg   PW Mean  14 mmHg   AO Systolic Pressure  [id number] mmHg   AO Diastolic Pressure  54 mmHg   AO Mean  83 mmHg   LV Systolic Pressure  123XX123 mmHg   LV Diastolic Pressure  5 mmHg   LV EDP  14 mmHg   Arterial Occlusion Pressure Extended Systolic Pressure  [id number] mmHg   Arterial Occlusion Pressure Extended Diastolic Pressure  50 mmHg   Arterial Occlusion Pressure Extended Mean Pressure  77 mmHg   Left Ventricular Apex Extended Systolic Pressure  123XX123 mmHg   Left Ventricular Apex Extended Diastolic Pressure  20 mmHg   Left Ventricular Apex Extended EDP Pressure  43 mmHg   QP/QS  1   TPVR Index  10.69 HRUI   TSVR Index  36.98 HRUI   PVR SVR Ratio  0.13   TPVR/TSVR Ratio  0.29     Cardiac TAVR CT  TECHNIQUE: The patient was scanned on a Philips 256 scanner. A 120 kV retrospective scan was triggered in the descending thoracic aorta at 111 HU's. Gantry rotation speed was 270 msecs and collimation was .9 mm. No beta blockade or nitro were given. The 3D data set was reconstructed in 5% intervals of the R-R cycle. Systolic and diastolic phases were analyzed on a dedicated work station using MPR, MIP and VRT modes. The patient received 80 cc of contrast.  MEDICATIONS: None  FINDINGS: Aortic Valve: Functionally bicuspid. Thickened  and calcified leaflet tips  Aorta: Mild calcification of the arch and descending thoracic aorta No aneurysm Normal origin of great vessels  Sinotubular Junction: 28 mm  Ascending Thoracic Aorta: 34 mm  Aortic Arch: 29 mm  Descending Thoracic Aorta: 23 mm  Sinus of Valsalva Measurements:  Non-coronary: 28 mm  Right -coronary: 31 mm  Left -coronary: 31 mm  Coronary Artery Height above Annulus:  Left Main:  11.2 mm  Right Coronary: 14.8 mm  Virtual Basal Annulus Measurements:  Maximum/Minimum Diameter: 31 mm x 22.8 mm  Perimeter: 87 mm  Area: 580 mm2  Coronary Arteries: Sufficient height above annulus for deployment  Optimum Fluoroscopic Angle for Delivery: LAO 15 degrees  IMPRESSION: 1) Calcified functionally bicuspid AV with annulus 580 mm2 suitable for a 29 mm Sapien 3 valve  2) Sufficient coronary artery height for deployment  3) Normal arch and great vessels with no aortic root enlargement  4) Optimum angle for deployment LAO 15 degrees  Jenkins Rouge  Electronically Signed: By: Jenkins Rouge M.D. On: 10/14/2015 12:44    STS Risk Calculator  Procedure    AVR  Risk of Mortality   6.5% Morbidity or Mortality  24.8% Prolonged LOS   12.0% Short LOS    16.2% Permanent Stroke   2.7% Prolonged Vent Support  17.8% DSW Infection    0.1% Renal Failure    5.3% Reoperation    10.3%   Impression:  Patient has stage D severe symptomatic aortic stenosis.  She presents with progressive symptoms of exertional shortness of breath and fatigue consistent with chronic diastolic congestive heart failure, [place] Heart Association functional class III. Symptoms or further exacerbated by the presence of persistent atrial fibrillation with relatively slow heart rate. I have personally reviewed the patient's recent transthoracic echocardiogram, diagnostic cardiac catheterization, and CT angiograms. Echocardiogram confirms the  presence of severe calcification, thickening, and restricted leaflet mobility involving all 3 leaflets of the aortic valve. Peak velocity across the aortic valve measures greater than 4 m/s. Left ventricular systolic function remains preserved. Diagnostic cardiac catheterization is notable for the absence of significant coronary artery disease. Risks associated with conventional surgical aortic valve replacement would be at least moderately elevated because of the patient's advanced age and frailty.  Cardiac gated CT angiogram of the heart demonstrates anatomical characteristics consistent with severe aortic stenosis without any significant complicating features.  CT angiogram of the aorta and iliac vessels has not been officially interpreted but the patient appears to have adequate pelvic vascular access to facilitate a transfemoral approach for TAVR.   Plan:  The patient and her daughter were counseled at length regarding treatment alternatives for management of severe symptomatic aortic stenosis.  The natural history of aortic stenosis and long-term prognosis with medical therapy was discussed.  Alternative approaches such as conventional surgical aortic valve replacement, transcatheter aortic valve replacement, and palliative medical therapy were compared and contrasted at length. This discussion was placed in the context of the patient's own specific clinical presentation and past medical history.  The patient hopes to proceed with transcatheter aortic valve replacement in the near future.  We tentatively plan for TAVR via transfemoral approach on Tuesday, 10/25/2015.    Following the decision to proceed with transcatheter aortic valve replacement, a discussion has been held regarding what types of management strategies would be attempted intraoperatively in the event of life-threatening complications, including whether or not the patient would be considered a candidate for the use of cardiopulmonary  bypass and/or conversion to open sternotomy for attempted surgical intervention.  The patient has been advised of a variety of complications that might develop including but not limited to risks of death, stroke, paravalvular leak, aortic dissection or other major vascular complications, aortic annulus rupture, device embolization, cardiac rupture or perforation, mitral regurgitation, acute myocardial infarction, arrhythmia, heart block or bradycardia requiring permanent pacemaker placement, congestive heart failure, respiratory failure, renal failure, pneumonia, infection, other late complications related  to structural valve deterioration or migration, or other complications that might ultimately cause a temporary or permanent loss of functional independence or other long term morbidity.  The patient provides full informed consent for the procedure as described and all questions were answered.  The patient has been instructed to stop taking Eliquis 5 days prior to her surgery.    Valentina Gu. Roxy Manns, MD 10/14/2015 3:16 PM

## 2015-10-14 NOTE — Patient Instructions (Signed)
Patient has been instructed to stop taking Eliquis 5 days prior to your scheduled surgery  Patient should continue taking all other medications without change through the day before surgery.  Patient should have nothing to eat or drink after midnight the night before surgery.  On the morning of surgery patient should not take any of her regular medications.

## 2015-10-19 ENCOUNTER — Encounter: Payer: Medicare Other | Admitting: Thoracic Surgery (Cardiothoracic Vascular Surgery)

## 2015-10-21 ENCOUNTER — Ambulatory Visit (HOSPITAL_COMMUNITY)
Admission: RE | Admit: 2015-10-21 | Discharge: 2015-10-21 | Disposition: A | Discharge status: Home / Self Care | Payer: Medicare Other | Source: Ambulatory Visit | Attending: Thoracic Surgery (Cardiothoracic Vascular Surgery) | Admitting: Thoracic Surgery (Cardiothoracic Vascular Surgery)

## 2015-10-21 ENCOUNTER — Encounter (HOSPITAL_COMMUNITY): Payer: Self-pay

## 2015-10-21 ENCOUNTER — Encounter (HOSPITAL_COMMUNITY)
Admission: RE | Admit: 2015-10-21 | Discharge: 2015-10-21 | Disposition: A | Discharge status: Home / Self Care | Payer: Medicare Other | Source: Ambulatory Visit | Attending: Cardiovascular Disease | Admitting: Cardiovascular Disease

## 2015-10-21 VITALS — BP 146/62 | HR 98 | Temp 97.6°F | Resp 18 | Ht 62.0 in | Wt 112.1 lb

## 2015-10-21 DIAGNOSIS — Z01818 Encounter for other preprocedural examination: Secondary | ICD-10-CM | POA: Insufficient documentation

## 2015-10-21 DIAGNOSIS — I110 Hypertensive heart disease with heart failure: Secondary | ICD-10-CM | POA: Diagnosis not present

## 2015-10-21 DIAGNOSIS — R911 Solitary pulmonary nodule: Secondary | ICD-10-CM | POA: Insufficient documentation

## 2015-10-21 DIAGNOSIS — J449 Chronic obstructive pulmonary disease, unspecified: Secondary | ICD-10-CM | POA: Insufficient documentation

## 2015-10-21 DIAGNOSIS — Z79899 Other long term (current) drug therapy: Secondary | ICD-10-CM | POA: Insufficient documentation

## 2015-10-21 DIAGNOSIS — I509 Heart failure, unspecified: Secondary | ICD-10-CM | POA: Diagnosis not present

## 2015-10-21 DIAGNOSIS — I350 Nonrheumatic aortic (valve) stenosis: Secondary | ICD-10-CM

## 2015-10-21 DIAGNOSIS — Z7901 Long term (current) use of anticoagulants: Secondary | ICD-10-CM | POA: Insufficient documentation

## 2015-10-21 DIAGNOSIS — Z01812 Encounter for preprocedural laboratory examination: Secondary | ICD-10-CM | POA: Insufficient documentation

## 2015-10-21 DIAGNOSIS — I517 Cardiomegaly: Secondary | ICD-10-CM | POA: Insufficient documentation

## 2015-10-21 HISTORY — DX: Unspecified osteoarthritis, unspecified site: M19.90

## 2015-10-21 HISTORY — DX: Anemia, unspecified: D64.9

## 2015-10-21 HISTORY — DX: Reserved for inherently not codable concepts without codable children: IMO0001

## 2015-10-21 HISTORY — DX: Cardiac murmur, unspecified: R01.1

## 2015-10-21 HISTORY — DX: Heart failure, unspecified: I50.9

## 2015-10-21 HISTORY — DX: Family history of other specified conditions: Z84.89

## 2015-10-21 HISTORY — DX: Pneumonia, unspecified organism: J18.9

## 2015-10-21 LAB — COMPREHENSIVE METABOLIC PANEL
ALT: 19 U/L (ref 14–54)
ANION GAP: 12 (ref 5–15)
AST: 30 U/L (ref 15–41)
Albumin: 3.6 g/dL (ref 3.5–5.0)
Alkaline Phosphatase: 95 U/L (ref 38–126)
BUN: 13 mg/dL (ref 6–20)
CHLORIDE: 98 mmol/L — AB (ref 101–111)
CO2: 23 mmol/L (ref 22–32)
CREATININE: 0.81 mg/dL (ref 0.44–1.00)
Calcium: 9 mg/dL (ref 8.9–10.3)
GFR calc non Af Amer: >60 mL/min (ref >60)
Glucose, Bld: 100 mg/dL — ABNORMAL HIGH (ref 65–99)
Potassium: 3.9 mmol/L (ref 3.5–5.1)
SODIUM: 133 mmol/L — AB (ref 135–145)
Total Bilirubin: 1.2 mg/dL (ref 0.3–1.2)
Total Protein: 7.1 g/dL (ref 6.5–8.1)

## 2015-10-21 LAB — BLOOD GAS, ARTERIAL
ACID-BASE EXCESS: 4.1 mmol/L — AB (ref 0.0–2.0)
Bicarbonate: 28 mEq/L — ABNORMAL HIGH (ref 20.0–24.0)
Drawn by: 206361
FIO2: 0.21
O2 SAT: 96.7 %
PCO2 ART: 41.4 mmHg (ref 35.0–45.0)
PH ART: 7.445 (ref 7.350–7.450)
Patient temperature: 98.6
TCO2: 29.3 mmol/L (ref 0–100)
pO2, Arterial: 85.8 mmHg (ref 80.0–100.0)

## 2015-10-21 LAB — CBC
HCT: 36.3 % (ref 36.0–46.0)
Hemoglobin: 12.2 g/dL (ref 12.0–15.0)
MCH: 29.6 pg (ref 26.0–34.0)
MCHC: 33.6 g/dL (ref 30.0–36.0)
MCV: 88.1 fL (ref 78.0–100.0)
PLATELETS: 186 10*3/uL (ref 150–400)
RBC: 4.12 MIL/uL (ref 3.87–5.11)
RDW: 13.5 % (ref 11.5–15.5)
WBC: 4.8 10*3/uL (ref 4.0–10.5)

## 2015-10-21 LAB — URINALYSIS, ROUTINE W REFLEX MICROSCOPIC
Bilirubin Urine: NEGATIVE
Glucose, UA: NEGATIVE mg/dL
Ketones, ur: NEGATIVE mg/dL
NITRITE: NEGATIVE
PROTEIN: NEGATIVE mg/dL
Specific Gravity, Urine: 1.008 (ref 1.005–1.030)
pH: 6 (ref 5.0–8.0)

## 2015-10-21 LAB — SURGICAL PCR SCREEN
MRSA, PCR: NEGATIVE
STAPHYLOCOCCUS AUREUS: NEGATIVE

## 2015-10-21 LAB — URINE MICROSCOPIC-ADD ON

## 2015-10-21 LAB — ABO/RH: ABO/RH(D): A POS

## 2015-10-21 LAB — APTT: aPTT: 40 seconds — ABNORMAL HIGH (ref 24–37)

## 2015-10-21 LAB — PROTIME-INR
INR: 1.3 (ref 0.00–1.49)
Prothrombin Time: 16.3 seconds — ABNORMAL HIGH (ref 11.6–15.2)

## 2015-10-21 NOTE — Pre-Procedure Instructions (Signed)
SHYLYNN STORIE  10/21/2015     Your procedure is scheduled on Tuesday, Oct 25, 2015 at 10:40 AM.   Report to [facility] Entrance "A" Admitting Office at 8:00 AM.   Call this number if you have problems the morning of surgery: [phone]   Any questions prior to day of surgery, please call [phone] between 8 & 4 PM.   Remember:  Do not eat food or drink liquids after midnight Monday, 10/24/15.  Take these medicines the morning of surgery with A SIP OF WATER: Amlodipine (Norvasc), Albuterol inhaler - if needed (bring with you day of surgery)   Do not wear jewelry, make-up or nail polish.  Do not wear lotions, powders, or perfumes.  You may NOT wear deodorant.  Men may shave face and neck.  Do not bring valuables to the hospital.  [facility] is not responsible for any belongings or valuables.  Contacts, dentures or bridgework may not be worn into surgery.  Leave your suitcase in the car.  After surgery it may be brought to your room.  For patients admitted to the hospital, discharge time will be determined by your treatment team.  Special instructions:  [facility] - Preparing for Surgery  Before surgery, you can play an important role.  Because skin is not sterile, your skin needs to be as free of germs as possible.  You can reduce the number of germs on you skin by washing with CHG (chlorahexidine gluconate) soap before surgery.  CHG is an antiseptic cleaner which kills germs and bonds with the skin to continue killing germs even after washing.  Please DO NOT use if you have an allergy to CHG or antibacterial soaps.  If your skin becomes reddened/irritated stop using the CHG and inform your nurse when you arrive at Short Stay.  Do not shave (including legs and underarms) for at least 48 hours prior to the first CHG shower.  You may shave your face.  Please follow these instructions carefully:   1.  Shower with CHG Soap the night before surgery and the                                 morning of Surgery.  2.  If you choose to wash your hair, wash your hair first as usual with your       normal shampoo.  3.  After you shampoo, rinse your hair and body thoroughly to remove the                      Shampoo.  4.  Use CHG as you would any other liquid soap.  You can apply chg directly       to the skin and wash gently with scrungie or a clean washcloth.  5.  Apply the CHG Soap to your body ONLY FROM THE NECK DOWN.        Do not use on open wounds or open sores.  Avoid contact with your eyes, ears, mouth and genitals (private parts).  Wash genitals (private parts) with your normal soap.  6.  Wash thoroughly, paying special attention to the area where your surgery        will be performed.  7.  Thoroughly rinse your body with warm water from the neck down.  8.  DO NOT shower/wash with your normal soap after using and rinsing off  the CHG Soap.  9.  Pat yourself dry with a clean towel.            10.  Wear clean pajamas.            11.  Place clean sheets on your bed the night of your first shower and do not        sleep with pets.  Day of Surgery  Do not apply any lotions/deodorants the morning of surgery.  Please wear clean clothes to the hospital.   Please read over the following fact sheets that you were given. Pain Booklet, Coughing and Deep Breathing, Blood Transfusion Information, MRSA Information and Surgical Site Infection Prevention

## 2015-10-22 LAB — HEMOGLOBIN A1C
Hgb A1c MFr Bld: 6.3 % — ABNORMAL HIGH (ref 4.8–5.6)
MEAN PLASMA GLUCOSE: 134 mg/dL

## 2015-10-24 MED ORDER — NITROGLYCERIN IN D5W 200-5 MCG/ML-% IV SOLN
2.0000 ug/min | INTRAVENOUS | Status: DC
  Filled 2015-10-24: qty 250

## 2015-10-24 MED ORDER — PHENYLEPHRINE HCL 10 MG/ML IJ SOLN
30.0000 ug/min | INTRAVENOUS | Status: DC
  Filled 2015-10-24: qty 2

## 2015-10-24 MED ORDER — SODIUM CHLORIDE 0.9 % IV SOLN
INTRAVENOUS | Status: DC
  Filled 2015-10-24: qty 30

## 2015-10-24 MED ORDER — DEXMEDETOMIDINE HCL IN NACL 400 MCG/100ML IV SOLN
0.1000 ug/kg/h | INTRAVENOUS | Status: AC
  Administered 2015-10-25: .3 ug/kg/h via INTRAVENOUS
  Filled 2015-10-24: qty 100

## 2015-10-24 MED ORDER — POTASSIUM CHLORIDE 2 MEQ/ML IV SOLN
80.0000 meq | INTRAVENOUS | Status: DC
  Filled 2015-10-24: qty 40

## 2015-10-24 MED ORDER — EPINEPHRINE HCL 1 MG/ML IJ SOLN
0.0000 ug/min | INTRAVENOUS | Status: DC
  Filled 2015-10-24: qty 4

## 2015-10-24 MED ORDER — VANCOMYCIN HCL IN DEXTROSE 1-5 GM/200ML-% IV SOLN: 1000.0000 mg | INTRAVENOUS | Status: DC

## 2015-10-24 MED ORDER — VANCOMYCIN HCL IN DEXTROSE 1-5 GM/200ML-% IV SOLN
1000.0000 mg | INTRAVENOUS | Status: AC
  Administered 2015-10-25: 1000 mg via INTRAVENOUS
  Filled 2015-10-24: qty 200

## 2015-10-24 MED ORDER — SODIUM CHLORIDE 0.9 % IV SOLN
INTRAVENOUS | Status: DC
  Filled 2015-10-24: qty 2.5

## 2015-10-24 MED ORDER — NOREPINEPHRINE BITARTRATE 1 MG/ML IV SOLN
0.0000 ug/min | INTRAVENOUS | Status: AC
  Administered 2015-10-25: 2 ug/min via INTRAVENOUS
  Filled 2015-10-24: qty 4

## 2015-10-24 MED ORDER — DEXTROSE 5 % IV SOLN: 1.5000 g | INTRAVENOUS | Status: DC

## 2015-10-24 MED ORDER — DEXMEDETOMIDINE HCL IN NACL 400 MCG/100ML IV SOLN
0.1000 ug/kg/h | INTRAVENOUS | Status: DC
  Filled 2015-10-24: qty 100

## 2015-10-24 MED ORDER — NOREPINEPHRINE BITARTRATE 1 MG/ML IV SOLN
0.0000 ug/min | INTRAVENOUS | Status: DC
  Filled 2015-10-24: qty 4

## 2015-10-24 MED ORDER — MAGNESIUM SULFATE 50 % IJ SOLN
40.0000 meq | INTRAMUSCULAR | Status: DC
  Filled 2015-10-24: qty 10

## 2015-10-24 MED ORDER — DOPAMINE-DEXTROSE 3.2-5 MG/ML-% IV SOLN: 0.0000 ug/kg/min | INTRAVENOUS | Status: DC

## 2015-10-24 MED ORDER — DEXTROSE 5 % IV SOLN
1.5000 g | INTRAVENOUS | Status: AC
  Administered 2015-10-25: 1.5 g via INTRAVENOUS
  Filled 2015-10-24: qty 1.5

## 2015-10-24 MED ORDER — DOPAMINE-DEXTROSE 3.2-5 MG/ML-% IV SOLN
0.0000 ug/kg/min | INTRAVENOUS | Status: DC
  Filled 2015-10-24: qty 250

## 2015-10-24 NOTE — H&P (Signed)
[address]       [place] [address]             [phone]      Cardiothoracic Surgery History and Physical   Referring Provider is Stanford Breed, Denice Bors, MD PCP is Stephens Shire, MD  Chief Complaint  Patient presents with  . Aortic Stenosis    SEVERE..eval for TAVR.Marland KitchenECHO 08/12/15, CATH 09/26/15    HPI:  The patient is an 82 year old woman with a history of hypertension, persistent atrial fibrillation on Eliquis and chronic diastolic heart failure. She has been followed for aortic stenosis for many years by Dr. Stanford Breed. She has had progressive dyspnea over the past 6 months. A recent echo on 08/12/2015 showed normal LV function with severe AS with a mean gradient of 43 mm Hg and an AVA of 0.44 cm2. She underwent cardiac cath on 09/26/2015 showing non-obstructive CAD with severe AS. The mean gradient was measured at 20 mm Hg and the AVA at 0.74 cm2.   She is widowed and lives alone. She is here today with her daughter who lives 6 miles away. Another daughter lives close by. She still takes care of her house.   Past Medical History  Diagnosis Date  . HTN (hypertension)   . Asthma   . Glaucoma   . Hearing loss   . Prolapsing mitral leaflet syndrome   . SVT (supraventricular tachycardia) (HCC)     S/P ablation of AVNRT in 2003  . Aortic stenosis   . Atrial fibrillation [facility])     Past Surgical History  Procedure Laterality Date  . Appendectomy    . Bladder surgery    . Cardiac surgery    . Nasal sinus surgery    . Cardiac catheterization N/A 09/26/2015    Procedure: Right/Left Heart Cath and Coronary Angiography; Surgeon: Burnell Blanks, MD; Location: [place] [facility]; Service: Cardiovascular; Laterality: N/A;    Family History  Problem Relation Age of Onset  . Pneumonia Father     Social History   Social History  . Marital Status: Widowed     Spouse Name: N/A  . Number of Children: 3  . Years of Education: N/A   Occupational History  . Not on file.   Social History Main Topics  . Smoking status: Never Smoker   . Smokeless tobacco: Not on file  . Alcohol Use: No  . Drug Use: No  . Sexual Activity: Not on file   Other Topics Concern  . Not on file   Social History Narrative    Current Outpatient Prescriptions  Medication Sig Dispense Refill  . albuterol (PROVENTIL HFA;VENTOLIN HFA) 108 (90 BASE) MCG/ACT inhaler Inhale 2 puffs into the lungs every 4 (four) hours as needed for wheezing or shortness of breath.     [name] amLODipine (NORVASC) 2.5 MG tablet Take 1 tablet (2.5 mg total) by mouth daily. 30 tablet 0  . apixaban (ELIQUIS) 2.5 MG TABS tablet Take 1 tablet (2.5 mg total) by mouth 2 (two) times daily. 60 tablet 1  . furosemide (LASIX) 40 MG tablet Take 1.5 tablets (60 mg total) by mouth daily. (Patient taking differently: Take 60 mg by mouth daily. Take one and one-half tablets (60mg ) daily.) 45 tablet 6   No current facility-administered medications for this visit.    Allergies  Allergen Reactions  . Hctz [Hydrochlorothiazide] Other (See Comments)    Pt was ill and affected kidneys  Review of Systems:  General:normal appetite, decreased energy, no weight gain, no weight loss, no fever Cardiac:no chest pain with exertion, no chest pain at rest, moderate SOB with mild exertion, no resting SOB, no PND, no orthopnea, no palpitations, has persistent atrial fibrillation, no LE edema, no dizzy spells, no syncope Respiratory:Has exertional shortness of breath, no home oxygen, no productive cough, no dry cough, no bronchitis, no wheezing, no hemoptysis, no asthma, no pain with inspiration or cough, no sleep apnea, no CPAP at  night GI:no difficulty swallowing, no reflux, no frequent heartburn, no hiatal hernia, no abdominal pain, no constipation, no diarrhea, no hematochezia, no hematemesis, no melena GU:no dysuria, no frequency, no urinary tract infection, no hematuria,no kidney stones, no kidney disease Vascular:no pain suggestive of claudication, no pain in feet, no leg cramps, no varicose veins, no DVT, no non-healing foot ulcer Neuro:no stroke, no TIA's, no seizures, no headaches, notemporary blindness one eye, no slurred speech, no peripheral neuropathy, no chronic pain, no instability of gait, no memory/cognitive dysfunction Musculoskeletal:no arthritis, no joint swelling, no myalgias, no difficulty walking, normal mobility  Skin:no rash, no itching, no skin infections, no pressure sores or ulcerations Psych:no anxiety, no depression, no nervousness, no unusual recent stress Eyes:no blurry vision, no floaters, no recent vision changes, wears glasses or contacts ENT:has hearing loss, no loose or painful teeth, wears dentures Hematologic:no easy bruising, no abnormal bleeding, no clotting disorder, no frequent epistaxis Endocrine:no diabetes, does not check CBG's at home     Physical Exam:  BP 170/75 mmHg  Pulse 54  Resp 16  Ht 5\' 2"  (1.575 m)  Wt 112 lb 12.8 oz (51.166 kg)  BMI 20.63 kg/m2  SpO2 94% General:Elderly but well-appearing HEENT:Unremarkable , NCAT, PERLA, EOMI, oropharynx  clar Neck:no JVD, no bruits, no adenopathy or thyromegaly Chest:clear to auscultation, symmetrical breath sounds, no wheezes, no rhonchi  CV:RRR, grade III/VI crescendo/decrescendo murmur heard best at RSB, no diastolic murmur Abdomen:soft, non-tender, no masses or organomegaly Extremities:warm, well-perfused, pulses palpable in feet, no LE edema Rectal/GUDeferred Neuro:Grossly non-focal and symmetrical throughout Skin:Clean and dry, no rashes, no breakdown   Diagnostic Tests:   [facility]  [address], [place] [address]  [phone]  ------------------------------------------------------------------- Transthoracic Echocardiography  Patient: Crystal, Cooper MR #: [id number] Study Date: 08/12/2015 Gender: F Age: 27 Height: 160 cm Weight: 53.1 kg BSA: 1.54 m^2 Pt. Status: Room:  ORDERING [name] SONOGRAPHER Bug Tussle, Will ATTENDING Ena Dawley, M.D. PERFORMING [facility], Outpatient  cc:  ------------------------------------------------------------------- LV EF: 60% - 65%  ------------------------------------------------------------------- Indications: (I35.0).  ------------------------------------------------------------------- History: PMH: Acquired from the patient and from the patient&'s chart. Atrial fibrillation. Aortic stenosis. Mitral valve disease. Risk factors: Hypertension.  ------------------------------------------------------------------- Study Conclusions  - Left ventricle: The cavity size was  normal. Systolic function was  normal. The estimated ejection fraction was in the range of 60%  to 65%. Wall motion was normal; there were no regional wall  motion abnormalities. - Aortic valve: Valve mobility was restricted. There was severe  stenosis. There was mild regurgitation. Mean gradient (S): 43 mm  Hg. Peak gradient (S): 71 mm Hg. - Aortic root: The aortic root was normal in size. - Ascending aorta: The ascending aorta was normal in size. - Mitral valve: Structurally normal valve. There was mild  regurgitation. - Left atrium: The atrium was normal in size. - Right ventricle: The cavity size was normal. Wall thickness was  normal. Systolic function was normal. - Right atrium: The atrium was mildly  dilated. - Tricuspid valve: There was moderate regurgitation. - Pulmonary arteries: Systolic pressure was mildly increased. PA  peak pressure: 42 mm Hg (S). - Inferior vena cava: The vessel was normal in size. - Pericardium, extracardiac: There was no pericardial effusion.  Transthoracic echocardiography. M-mode, complete 2D, spectral Doppler, and color Doppler. Birthdate: Patient birthdate: [birth year]. Age: Patient is 82 yr old. Sex: Gender: female. BMI: 20.7 kg/m^2. Blood pressure: 150/72 Patient status: Outpatient. Study date: Study date: 08/12/2015. Study time: 03:12 PM. Location: [facility]  -------------------------------------------------------------------  ------------------------------------------------------------------- Left ventricle: The cavity size was normal. Systolic function was normal. The estimated ejection fraction was in the range of 60% to 65%. Wall motion was normal; there were no regional wall motion abnormalities. The study was not technically sufficient to allow evaluation of LV diastolic dysfunction due to atrial fibrillation.  ------------------------------------------------------------------- Aortic valve:  Trileaflet; severely thickened, severely calcified leaflets. Valve mobility was restricted. Doppler: There was severe stenosis. There was mild regurgitation. VTI ratio of LVOT to aortic valve: 0.14. Valve area (VTI): 0.45 cm^2. Indexed valve area (VTI): 0.29 cm^2/m^2. Peak velocity ratio of LVOT to aortic valve: 0.14. Valve area (Vmax): 0.44 cm^2. Indexed valve area (Vmax): 0.29 cm^2/m^2. Mean velocity ratio of LVOT to aortic valve: 0.14. Valve area (Vmean): 0.45 cm^2. Indexed valve area (Vmean): 0.29 cm^2/m^2. Mean gradient (S): 43 mm Hg. Peak gradient (S): 71 mm Hg.  ------------------------------------------------------------------- Aorta: Aortic root: The aortic root was normal in size. Ascending aorta: The ascending aorta was normal in size.  ------------------------------------------------------------------- Mitral valve: Structurally normal valve. Mobility was not restricted. Doppler: Transvalvular velocity was within the normal range. There was no evidence for stenosis. There was mild regurgitation. Peak gradient (D): 7 mm Hg.  ------------------------------------------------------------------- Left atrium: The atrium was normal in size.  ------------------------------------------------------------------- Right ventricle: The cavity size was normal. Wall thickness was normal. Systolic function was normal.  ------------------------------------------------------------------- Pulmonic valve: Structurally normal valve. Cusp separation was normal. Doppler: Transvalvular velocity was within the normal range. There was no evidence for stenosis. There was no regurgitation.  ------------------------------------------------------------------- Tricuspid valve: Structurally normal valve. Doppler: Transvalvular velocity was within the normal range. There was moderate  regurgitation.  ------------------------------------------------------------------- Pulmonary artery: The main pulmonary artery was normal-sized. Systolic pressure was mildly increased.  ------------------------------------------------------------------- Right atrium: The atrium was mildly dilated.  ------------------------------------------------------------------- Pericardium: There was no pericardial effusion.  ------------------------------------------------------------------- Systemic veins: Inferior vena cava: The vessel was normal in size.  ------------------------------------------------------------------- Measurements  Left ventricle Value Reference LV ID, ED, PLAX chordal (L) 35.5 mm 43 - 52 LV ID, ES, PLAX chordal 25.3 mm 23 - 38 LV fx shortening, PLAX chordal 29 % >=29 LV PW thickness, ED 10.3 mm --------- IVS/LV PW ratio, ED 1.14 <=1.3 Stroke volume, 2D 47 ml --------- Stroke volume/bsa, 2D 31 ml/m^2 ---------  Ventricular septum Value Reference IVS thickness, ED 11.7 mm ---------  LVOT Value Reference LVOT ID, S 20 mm --------- LVOT area 3.14 cm^2 --------- LVOT ID 20 mm --------- LVOT peak velocity, S 59.3 cm/s --------- LVOT mean velocity, S 40.6 cm/s --------- LVOT VTI, S 15.1 cm --------- LVOT peak gradient, S 1 mm Hg --------- Stroke volume (SV), LVOT DP  47.4 ml --------- Stroke index (SV/bsa), LVOT DP 30.9 ml/m^2 ---------  Aortic valve Value Reference Aortic valve peak velocity, S 420 cm/s --------- Aortic valve mean velocity, S 282 cm/s --------- Aortic valve VTI, S 106 cm --------- Aortic mean gradient, S 36 mm Hg --------- Aortic peak gradient, S 71 mm Hg --------- VTI ratio,  LVOT/AV 0.14 --------- Aortic valve area, VTI 0.45 cm^2 --------- Aortic valve area/bsa, VTI 0.29 cm^2/m^2 --------- Velocity ratio, peak, LVOT/AV 0.14 --------- Aortic valve area, peak velocity 0.44 cm^2 --------- Aortic valve area/bsa, peak 0.29 cm^2/m^2 --------- velocity Velocity ratio, mean, LVOT/AV 0.14 --------- Aortic valve area, mean velocity 0.45 cm^2 --------- Aortic valve area/bsa, mean 0.29 cm^2/m^2 --------- velocity  Aorta Value Reference Aortic root ID, ED 32 mm --------- Ascending aorta ID, A-P, S 32 mm ---------  Left atrium Value Reference LA ID, A-P, ES 30 mm --------- LA ID/bsa, A-P 1.95 cm/m^2 <=2.2 LA volume, ES, 1-p A4C 48 ml --------- LA volume/bsa, ES, 1-p A4C 31.3 ml/m^2 --------- LA volume, ES, 1-p A2C 64 ml --------- LA volume/bsa, ES, 1-p A2C 41.7 ml/m^2 ---------  Mitral valve Value Reference Mitral E-wave peak  velocity 130 cm/s --------- Mitral deceleration time 211 ms 150 - 230 Mitral peak gradient, D 7 mm Hg ---------  Pulmonary arteries Value Reference PA pressure, S, DP (H) 42 mm Hg <=30  Tricuspid valve Value Reference Tricuspid regurg peak velocity 311 cm/s --------- Tricuspid peak RV-RA gradient 39 mm Hg ---------  Systemic veins Value Reference Estimated CVP 3 mm Hg ---------  Right ventricle Value Reference RV pressure, S, DP (H) 42 mm Hg <=30  Legend: (L) and (H) mark values outside specified reference range.  ------------------------------------------------------------------- Prepared and Electronically Authenticated by  Ena Dawley, M.D. 2017-02-17T17:01:38  Burnell Blanks, MD (Primary)      Procedures    Right/Left Heart Cath and Coronary Angiography    Conclusion     Prox RCA to Mid RCA lesion, 10% stenosed.  Ost LAD to Prox LAD lesion, 10% stenosed.  Prox LAD to Mid LAD lesion, 30% stenosed.  1. Mild non-obstructive CAD 2. Severe aortic valve stenosis (mean gradient 20.4 mm Hg, AVA 0.74 cm2). The valve appears to be heavily calcified.   Recommendations: Will continue with planning for TAVR. She will see our surgeons and CT scans will be arranged. Resume Eliquis tomorrow if no bleeding from cath access sites.      Indications    Severe aortic stenosis [I35.0 (ICD-10-CM)]    Technique and Indications    Estimated blood loss <50 mL. Indication: 82 yo female with history of severe AS here today for right and left heart cath for workup prior to TAVR.   Procedure: The  risks, benefits, complications, treatment options, and expected outcomes were discussed with the patient. The patient and/or family concurred with the proposed plan, giving informed consent. The patient was brought to the cath lab after IV hydration was begun and oral premedication was given. The patient was further sedated with Versed and Fentanyl. There was an IV catheter present in the right antecubital vein. This area was prepped and draped. I then changed this out for a 5 [place] sheath. A right heart catheterization was performed with a balloon tipped catheter. The right wrist was assessed with a modified Allens test which was positive. The right wrist was prepped and draped in a sterile fashion. 1% lidocaine was used for local anesthesia. Using the modified Seldinger access technique, a 5 French sheath was placed in the right radial artery. 3 mg Verapamil was given through the sheath. 3000 units IV heparin was given. Standard diagnostic catheters were used to perform selective coronary angiography. I crossed the aortic valve with an AL-1 catheter and a straight wire. LV pressures measured. No LV gram performed. The sheath was removed from the right radial artery and a Terumo  hemostasis band was applied at the arteriotomy site on the right wrist.   Sedation: During this procedure the patient is administered a total of Versed 1 mg and Fentanyl 25 mcg to achieve and maintain moderate conscious sedation. The patient's heart rate, blood pressure, and oxygen saturation are monitored continuously during the procedure. The period of conscious sedation is 27 minutes, of which I was present face-to-face 100% of this time.    Coronary Findings    Dominance: Right   Left Anterior Descending   . Ost LAD to Prox LAD lesion, 10% stenosed. Calcified diffuse.   . Prox LAD to Mid LAD lesion, 30% stenosed. Calcified diffuse.   . First Diagonal Branch   The vessel is moderate in size and is  angiographically normal.   . Second Diagonal Branch   The vessel is small in size.   . Third Diagonal Branch   The vessel is small in size.     Left Circumflex  . Vessel is moderate in size. Vessel is angiographically normal.   . Second Obtuse Marginal Branch   The vessel is moderate in size and is angiographically normal.     Right Coronary Artery  . Vessel is large.   . Prox RCA to Mid RCA lesion, 10% stenosed. Diffuse.   . Right Posterior Descending Artery   The vessel is large in size.   . Right Posterior Atrioventricular Branch   The vessel is moderate in size.       Right Heart Pressures Elevated LV EDP consistent with volume overload.    Left Heart    Aortic Valve There is severe aortic valve stenosis. The aortic valve is calcified.    Coronary Diagrams    Diagnostic Diagram            Implants    No implant documentation for this case.    PACS Images    Show images for Cardiac catheterization     Link to Procedure Log    Procedure Log      Hemo Data       Most Recent Value   Fick Cardiac Output  3.37 L/min   Fick Cardiac Output Index  2.25 (L/min)/BSA   Aortic Mean Gradient  20.4 mmHg   Aortic Peak Gradient  17 mmHg   Aortic Valve Area  0.74   Aortic Value Area Index  0.49 cm2/BSA   RA A Wave  6 mmHg   RA V Wave  6 mmHg   RA Mean  5 mmHg   RV Systolic Pressure  47 mmHg   RV Diastolic Pressure  0 mmHg   RV EDP  5 mmHg   PA Systolic Pressure  43 mmHg   PA Diastolic Pressure  11 mmHg   PA Mean  24 mmHg   PW A Wave  17 mmHg   PW V Wave  26 mmHg   PW Mean  14 mmHg   AO Systolic Pressure  [id number] mmHg   AO Diastolic Pressure  54 mmHg   AO Mean  83 mmHg   LV Systolic Pressure  123XX123 mmHg   LV Diastolic Pressure  5 mmHg   LV EDP  14 mmHg    Arterial Occlusion Pressure Extended Systolic Pressure  [id number] mmHg   Arterial Occlusion Pressure Extended Diastolic Pressure  50 mmHg   Arterial Occlusion Pressure Extended Mean Pressure  77 mmHg   Left Ventricular Apex Extended Systolic Pressure  123XX123 mmHg   Left Ventricular Apex Extended Diastolic Pressure  20 mmHg   Left Ventricular Apex Extended EDP Pressure  43 mmHg   QP/QS  1   TPVR Index  10.69 HRUI   TSVR Index  36.98 HRUI   PVR SVR Ratio  0.13   TPVR/TSVR Ratio  0.29   ADDENDUM REPORT: 10/14/2015 13:56 EXAM: OVER-READ INTERPRETATION CT CHEST The following report is an over-read performed by radiologist Dr. Fonnie Birkenhead [facility] on 10/14/2015. This over-read does not include interpretation of cardiac or coronary anatomy or pathology. The interpretation by the cardiologist is attached. COMPARISON: None. FINDINGS: Lymph nodes adjacent to the distal esophagus and descending thoracic aorta are sub cm in short axis size. Mosaic attenuation in the lungs is nonspecific. Probable mucoid impaction in the left upper lobe (series 512, image 8). No pericardial or pleural effusion. A fluid density nodule in the medial right breast measures 1.6 cm (series 513, image 48). IMPRESSION: 1. No acute extracardiac findings. 2. Fluid density lesion in the medial right breast. Correlation with physical exam, mammogram and possibly ultrasound recommended, as clinically indicated. Electronically Signed  By: Lorin Picket M.D.  On: 10/14/2015 13:56     Study Result     CLINICAL DATA: Aortic stenosis  EXAM: Cardiac TAVR CT  TECHNIQUE: The patient was scanned on a Philips 256 scanner. A 120 kV retrospective scan was triggered in the descending thoracic aorta at 111 HU's. Gantry rotation speed was 270 msecs and collimation was .9 mm. No beta blockade or nitro were given. The 3D data set was reconstructed in  5% intervals of the R-R cycle. Systolic and diastolic phases were analyzed on a dedicated work station using MPR, MIP and VRT modes. The patient received 80 cc of contrast.  MEDICATIONS: None  FINDINGS: Aortic Valve: Functionally bicuspid. Thickened and calcified leaflet tips  Aorta: Mild calcification of the arch and descending thoracic aorta No aneurysm Normal origin of great vessels  Sinotubular Junction: 28 mm  Ascending Thoracic Aorta: 34 mm  Aortic Arch: 29 mm  Descending Thoracic Aorta: 23 mm  Sinus of Valsalva Measurements:  Non-coronary: 28 mm  Right -coronary: 31 mm  Left -coronary: 31 mm  Coronary Artery Height above Annulus:  Left Main: 11.2 mm  Right Coronary: 14.8 mm  Virtual Basal Annulus Measurements:  Maximum/Minimum Diameter: 31 mm x 22.8 mm  Perimeter: 87 mm  Area: 580 mm2  Coronary Arteries: Sufficient height above annulus for deployment  Optimum Fluoroscopic Angle for Delivery: LAO 15 degrees  IMPRESSION: 1) Calcified functionally bicuspid AV with annulus 580 mm2 suitable for a 29 mm Sapien 3 valve  2) Sufficient coronary artery height for deployment  3) Normal arch and great vessels with no aortic root enlargement  4) Optimum angle for deployment LAO 15 degrees  Jenkins Rouge  Electronically Signed: By: Jenkins Rouge M.D. On: 10/14/2015 12:44   CLINICAL DATA: 82 year old female with history of severe aortic stenosis. Preprocedural study prior to potential transcatheter aortic valve replacement (TAVR) procedure.  EXAM: CT ANGIOGRAPHY CHEST, ABDOMEN AND PELVIS  TECHNIQUE: Multidetector CT imaging through the chest, abdomen and pelvis was performed using the standard protocol during bolus administration of intravenous contrast. Multiplanar reconstructed images and MIPs were obtained and reviewed to evaluate the vascular anatomy.  CONTRAST: 70 mL of Isovue 370.  COMPARISON:  No priors.  FINDINGS: CTA CHEST FINDINGS  Mediastinum/Lymph Nodes: Heart size is mildly enlarged. There is no significant pericardial fluid, thickening or pericardial calcification. There is atherosclerosis of the thoracic aorta, the great vessels of the mediastinum and the  coronary arteries, including calcified atherosclerotic plaque in the left anterior descending and right coronary arteries. Severe thickening and calcification of the aortic valve, compatible with the reported clinical history of aortic stenosis. No pathologically enlarged mediastinal or hilar lymph nodes. Esophagus is unremarkable in appearance. No axillary lymphadenopathy.  Lungs/Pleura: Scattered areas of mild scarring are noted throughout the lungs bilaterally, most evident in the right middle lobe and anterior aspect of the lingula. In the left upper lobe there is a 12 x 5 mm (mean diameter of 9 mm) pulmonary nodule which appears to have a branching appearance on coronal reconstructions (image 37 of series 408), most favored to represent an area of mucoid impaction. A few other scattered areas of apparent mucoid impaction are also noted. No acute consolidative airspace disease. No pleural effusions. There is a mosaic attenuation throughout the lung parenchyma with areas of lucency interspersed with areas of mild ground-glass attenuation, likely to reflect air trapping from small airways disease.  Musculoskeletal/Soft Tissues: There are no aggressive appearing lytic or blastic lesions noted in the visualized portions of the skeleton.  CTA ABDOMEN AND PELVIS FINDINGS  Hepatobiliary: No cystic or solid hepatic lesions. No intra or extrahepatic biliary ductal dilatation. Gallbladder is normal in appearance.  Pancreas: No pancreatic mass. No pancreatic ductal dilatation. No pancreatic or peripancreatic fluid or inflammatory changes.  Spleen: Unremarkable.  Adrenals/Urinary Tract: 12 mm  low-attenuation lesion in the interpolar region of the right kidney is compatible with a tiny cyst. Other smaller sub cm low-attenuation lesions in the kidneys bilaterally are too small to definitively characterize, but are also favored to represent tiny cysts. Bilateral adrenal glands are normal in appearance. There is no hydroureteronephrosis. Urinary bladder is trabeculated, and there are several bladder wall diverticulae.  Stomach/Bowel: The stomach is normal in appearance. No pathologic dilatation of small bowel or colon. The appendix is not confidently identified may be surgically absent. Regardless, there are no inflammatory changes noted adjacent to the cecum to suggest the presence of an acute appendicitis at this time.  Vascular/Lymphatic: Vascular findings and measurements pertinent to potential TAVR procedure, as detailed below. No aneurysm or dissection identified in the abdominal or pelvic vasculature. No lymphadenopathy noted in the abdomen or pelvis.  Reproductive: Uterus is retroverted, and markedly heterogeneous in appearance with multiple coarse calcifications, compatible with a fibroid uterus. Ovaries are atrophic.  Other: No significant ascites and no pneumoperitoneum noted.  Musculoskeletal: There are no aggressive appearing lytic or blastic lesions noted in the visualized portions of the skeleton.  VASCULAR MEASUREMENTS PERTINENT TO TAVR:  AORTA:  Minimal Aortic Diameter - 14 x 14 mm  Severity of Aortic Calcification - moderate to severe  RIGHT PELVIS:  Right Common Iliac Artery -  Minimal Diameter - 8.5 x 8.6 mm  Tortuosity - mild  Calcification - mild  Right External Iliac Artery -  Minimal Diameter - 5.8 x 5.7 mm  Tortuosity - moderate  Calcification - none  Right Common Femoral Artery -  Minimal Diameter - 6.1 x 5.9 mm  Tortuosity - mild  Calcification - mild  LEFT PELVIS:  Left Common Iliac Artery  -  Minimal Diameter - 8.2 x 8.4 mm  Tortuosity - mild  Calcification - mild  Left External Iliac Artery -  Minimal Diameter - 5.8 x 6.2 mm  Tortuosity - moderate  Calcification - none  Left Common Femoral Artery -  Minimal Diameter - 6.2 x 5.5 mm  Tortuosity - mild  Calcification - mild  Review of the  MIP images confirms the above findings.  IMPRESSION: 1. Vascular findings and measurements pertinent to potential TAVR procedure, as detailed above. This patient may have suitable pelvic arterial access for low profile devices, as detailed above. 2. Severe thickening and calcification of the aortic valve, compatible with the reported clinical history of severe aortic stenosis. 3. Probable air trapping in the lungs, suggestive of small airways disease. 4. Scattered pulmonary nodules, as discussed above, the largest of which in the left upper lobe has a mean diameter of 9 mm, but appears to have a branching configuration, suggestive of mucoid impaction. Follow-up noncontrast chest CT is recommended in 3 months to evaluate for the stability or resolution of this finding. 5. Additional incidental findings, as above.   Electronically Signed  By: Vinnie Langton M.D.  On: 10/17/2015 11:53    STS Risk Score: AVR  Risk of Mortality: 4.097% Morbidity or Mortality: 22.329% Long Length of Stay: 9.591% Short Length of Stay: 20.333% Permanent Stroke: 2.526% Prolonged Ventilation: 13.159% DSW Infection: 0.111% Renal Failure: 4.793% Reoperation: 9.879%  Impression:  This 82 year old woman has stage D severe symptomatic aortic stenosis with a trileaflet, severely thickened and calcified valve with restricted mobility and a mean gradient of 43 mm Hg. Her cath shows no significant coronary disease. She has progressive exertional shortness of breath and fatigue due to diastolic congestive heart failure with NYHA class II symptoms. I think AVR is indicated.  Her risk for open surgical AVR is at least moderately elevated due to her advanced age and atrial fibrillation. I think TAVR is a reasonable alternative. Cardiac gated CT angiogram of the heart demonstrates anatomical characteristics consistent with severe aortic stenosis without any significant complicating features. CTA of the abdomen and pelvis appears to show adequate vascular access for a transfemoral approach.   The patient and her daughter were counseled at length regarding treatment alternatives for management of severe symptomatic aortic stenosis. Alternative approaches such as conventional aortic valve replacement, transcatheter aortic valve replacement, and palliative medical therapy were compared and contrasted at length. The risks associated with conventional surgical aortic valve replacement were been discussed in detail, as were expectations for post-operative convalescence. Long-term prognosis with medical therapy was discussed.   We discussed complications that might develop including but not limited to risks of death, stroke, paravalvular leak, aortic dissection or other major vascular complications, aortic annulus rupture, device embolization, cardiac rupture or perforation, mitral regurgitation, acute myocardial infarction, arrhythmia, heart block or bradycardia requiring permanent pacemaker placement, congestive heart failure, respiratory failure, renal failure, pneumonia, infection, other late complications related to structural valve deterioration or migration, or other complications that might ultimately cause a temporary or permanent loss of functional independence or other long term morbidity.   I discussed transcatheter aortic valve replacement, what types of management strategies would be attempted intraoperatively in the event of life-threatening complications, including whether or not the patient would be considered a candidate for the use of cardiopulmonary bypass and/or  conversion to open sternotomy for attempted surgical intervention.  The patient has been advised of a variety of complications that might develop including but not limited to risks of death, stroke, paravalvular leak, aortic dissection or other major vascular complications, aortic annulus rupture, device embolization, cardiac rupture or perforation, mitral regurgitation, acute myocardial infarction, arrhythmia, heart block or bradycardia requiring permanent pacemaker placement, congestive heart failure, respiratory failure, renal failure, pneumonia, infection, other late complications related to structural valve deterioration or migration, or other complications that might ultimately cause a temporary or  permanent loss of functional independence or other long term morbidity.  The patient provides full informed consent for the procedure as described and all questions were answered.

## 2015-10-25 ENCOUNTER — Inpatient Hospital Stay (HOSPITAL_COMMUNITY): Payer: Medicare Other | Admitting: Certified Registered"

## 2015-10-25 ENCOUNTER — Encounter (HOSPITAL_COMMUNITY): Payer: Self-pay | Admitting: *Deleted

## 2015-10-25 ENCOUNTER — Ambulatory Visit (HOSPITAL_COMMUNITY): Payer: Medicare Other

## 2015-10-25 ENCOUNTER — Encounter (HOSPITAL_COMMUNITY)
Admission: RE | Disposition: A | Discharge status: Home Health | Payer: Self-pay | Source: Ambulatory Visit | Attending: Cardiovascular Disease

## 2015-10-25 ENCOUNTER — Inpatient Hospital Stay (HOSPITAL_COMMUNITY): Payer: Medicare Other

## 2015-10-25 ENCOUNTER — Inpatient Hospital Stay (HOSPITAL_COMMUNITY)
Admission: RE | Admit: 2015-10-25 | Discharge: 2015-10-28 | DRG: 267 | Disposition: A | Discharge status: Home Health | Payer: Medicare Other | Source: Ambulatory Visit | Attending: Cardiovascular Disease | Admitting: Cardiovascular Disease

## 2015-10-25 DIAGNOSIS — I2510 Atherosclerotic heart disease of native coronary artery without angina pectoris: Secondary | ICD-10-CM | POA: Diagnosis present

## 2015-10-25 DIAGNOSIS — I5032 Chronic diastolic (congestive) heart failure: Secondary | ICD-10-CM | POA: Diagnosis not present

## 2015-10-25 DIAGNOSIS — I350 Nonrheumatic aortic (valve) stenosis: Secondary | ICD-10-CM

## 2015-10-25 DIAGNOSIS — D649 Anemia, unspecified: Secondary | ICD-10-CM | POA: Diagnosis not present

## 2015-10-25 DIAGNOSIS — I4892 Unspecified atrial flutter: Secondary | ICD-10-CM | POA: Diagnosis present

## 2015-10-25 DIAGNOSIS — Y831 Surgical operation with implant of artificial internal device as the cause of abnormal reaction of the patient, or of later complication, without mention of misadventure at the time of the procedure: Secondary | ICD-10-CM | POA: Diagnosis not present

## 2015-10-25 DIAGNOSIS — H9190 Unspecified hearing loss, unspecified ear: Secondary | ICD-10-CM | POA: Diagnosis present

## 2015-10-25 DIAGNOSIS — Z952 Presence of prosthetic heart valve: Secondary | ICD-10-CM | POA: Diagnosis not present

## 2015-10-25 DIAGNOSIS — I4891 Unspecified atrial fibrillation: Secondary | ICD-10-CM | POA: Diagnosis not present

## 2015-10-25 DIAGNOSIS — I110 Hypertensive heart disease with heart failure: Secondary | ICD-10-CM | POA: Diagnosis present

## 2015-10-25 DIAGNOSIS — Z95818 Presence of other cardiac implants and grafts: Secondary | ICD-10-CM

## 2015-10-25 DIAGNOSIS — D62 Acute posthemorrhagic anemia: Secondary | ICD-10-CM | POA: Diagnosis not present

## 2015-10-25 DIAGNOSIS — R001 Bradycardia, unspecified: Secondary | ICD-10-CM | POA: Diagnosis not present

## 2015-10-25 DIAGNOSIS — R5381 Other malaise: Secondary | ICD-10-CM | POA: Diagnosis not present

## 2015-10-25 DIAGNOSIS — Z006 Encounter for examination for normal comparison and control in clinical research program: Secondary | ICD-10-CM

## 2015-10-25 DIAGNOSIS — I083 Combined rheumatic disorders of mitral, aortic and tricuspid valves: Secondary | ICD-10-CM | POA: Diagnosis not present

## 2015-10-25 DIAGNOSIS — Z7901 Long term (current) use of anticoagulants: Secondary | ICD-10-CM | POA: Diagnosis not present

## 2015-10-25 DIAGNOSIS — L7632 Postprocedural hematoma of skin and subcutaneous tissue following other procedure: Secondary | ICD-10-CM | POA: Diagnosis not present

## 2015-10-25 DIAGNOSIS — J90 Pleural effusion, not elsewhere classified: Secondary | ICD-10-CM | POA: Diagnosis not present

## 2015-10-25 DIAGNOSIS — D500 Iron deficiency anemia secondary to blood loss (chronic): Secondary | ICD-10-CM | POA: Diagnosis not present

## 2015-10-25 DIAGNOSIS — Z954 Presence of other heart-valve replacement: Secondary | ICD-10-CM | POA: Diagnosis not present

## 2015-10-25 DIAGNOSIS — I517 Cardiomegaly: Secondary | ICD-10-CM | POA: Diagnosis not present

## 2015-10-25 DIAGNOSIS — Z79899 Other long term (current) drug therapy: Secondary | ICD-10-CM

## 2015-10-25 DIAGNOSIS — J811 Chronic pulmonary edema: Secondary | ICD-10-CM | POA: Diagnosis not present

## 2015-10-25 DIAGNOSIS — R0602 Shortness of breath: Secondary | ICD-10-CM | POA: Diagnosis not present

## 2015-10-25 DIAGNOSIS — I481 Persistent atrial fibrillation: Secondary | ICD-10-CM | POA: Diagnosis present

## 2015-10-25 DIAGNOSIS — I509 Heart failure, unspecified: Secondary | ICD-10-CM | POA: Diagnosis not present

## 2015-10-25 HISTORY — PX: TEE WITHOUT CARDIOVERSION: SHX5443

## 2015-10-25 HISTORY — PX: TRANSCATHETER AORTIC VALVE REPLACEMENT, TRANSFEMORAL: SHX6400

## 2015-10-25 LAB — POCT I-STAT, CHEM 8
BUN: 12 mg/dL (ref 6–20)
BUN: 11 mg/dL (ref 6–20)
BUN: 10 mg/dL (ref 6–20)
BUN: 11 mg/dL (ref 6–20)
CALCIUM ION: 1.13 mmol/L (ref 1.13–1.30)
CALCIUM ION: 1.1 mmol/L — AB (ref 1.13–1.30)
CHLORIDE: 97 mmol/L — AB (ref 101–111)
CREATININE: 0.6 mg/dL (ref 0.44–1.00)
CREATININE: 0.6 mg/dL (ref 0.44–1.00)
Calcium, Ion: 1.13 mmol/L (ref 1.13–1.30)
Calcium, Ion: 1.16 mmol/L (ref 1.13–1.30)
Chloride: 96 mmol/L — ABNORMAL LOW (ref 101–111)
Chloride: 96 mmol/L — ABNORMAL LOW (ref 101–111)
Chloride: 98 mmol/L — ABNORMAL LOW (ref 101–111)
Creatinine, Ser: 0.5 mg/dL (ref 0.44–1.00)
Creatinine, Ser: 0.5 mg/dL (ref 0.44–1.00)
GLUCOSE: 171 mg/dL — AB (ref 65–99)
GLUCOSE: 140 mg/dL — AB (ref 65–99)
Glucose, Bld: 150 mg/dL — ABNORMAL HIGH (ref 65–99)
Glucose, Bld: 132 mg/dL — ABNORMAL HIGH (ref 65–99)
HEMATOCRIT: 36 % (ref 36.0–46.0)
HEMATOCRIT: 36 % (ref 36.0–46.0)
HEMATOCRIT: 38 % (ref 36.0–46.0)
HEMATOCRIT: 35 % — AB (ref 36.0–46.0)
HEMOGLOBIN: 12.2 g/dL (ref 12.0–15.0)
HEMOGLOBIN: 12.2 g/dL (ref 12.0–15.0)
HEMOGLOBIN: 12.9 g/dL (ref 12.0–15.0)
Hemoglobin: 11.9 g/dL — ABNORMAL LOW (ref 12.0–15.0)
POTASSIUM: 3.1 mmol/L — AB (ref 3.5–5.1)
POTASSIUM: 3.1 mmol/L — AB (ref 3.5–5.1)
Potassium: 3 mmol/L — ABNORMAL LOW (ref 3.5–5.1)
Potassium: 3 mmol/L — ABNORMAL LOW (ref 3.5–5.1)
SODIUM: 135 mmol/L (ref 135–145)
SODIUM: 135 mmol/L (ref 135–145)
SODIUM: 136 mmol/L (ref 135–145)
Sodium: 135 mmol/L (ref 135–145)
TCO2: 30 mmol/L (ref 0–100)
TCO2: 29 mmol/L (ref 0–100)
TCO2: 28 mmol/L (ref 0–100)
TCO2: 26 mmol/L (ref 0–100)

## 2015-10-25 LAB — PROTIME-INR
INR: 1.25 (ref 0.00–1.49)
INR: 1.54 — ABNORMAL HIGH (ref 0.00–1.49)
PROTHROMBIN TIME: 18.6 s — AB (ref 11.6–15.2)
Prothrombin Time: 15.9 seconds — ABNORMAL HIGH (ref 11.6–15.2)

## 2015-10-25 LAB — POCT I-STAT 3, ART BLOOD GAS (G3+)
ACID-BASE EXCESS: 6 mmol/L — AB (ref 0.0–2.0)
ACID-BASE EXCESS: 6 mmol/L — AB (ref 0.0–2.0)
ACID-BASE EXCESS: 2 mmol/L (ref 0.0–2.0)
BICARBONATE: 30.4 meq/L — AB (ref 20.0–24.0)
BICARBONATE: 27.5 meq/L — AB (ref 20.0–24.0)
Bicarbonate: 29.7 mEq/L — ABNORMAL HIGH (ref 20.0–24.0)
O2 SAT: 100 %
O2 Saturation: 100 %
O2 Saturation: 99 %
PCO2 ART: 43.3 mmHg (ref 35.0–45.0)
PH ART: 7.467 — AB (ref 7.350–7.450)
PH ART: 7.404 (ref 7.350–7.450)
PO2 ART: 541 mmHg — AB (ref 80.0–100.0)
PO2 ART: 114 mmHg — AB (ref 80.0–100.0)
Patient temperature: 35.5
TCO2: 32 mmol/L (ref 0–100)
TCO2: 31 mmol/L (ref 0–100)
TCO2: 29 mmol/L (ref 0–100)
pCO2 arterial: 41.5 mmHg (ref 35.0–45.0)
pCO2 arterial: 36 mmHg (ref 35.0–45.0)
pH, Arterial: 7.519 — ABNORMAL HIGH (ref 7.350–7.450)
pO2, Arterial: 557 mmHg — ABNORMAL HIGH (ref 80.0–100.0)

## 2015-10-25 LAB — CBC
HCT: 31.2 % — ABNORMAL LOW (ref 36.0–46.0)
HEMOGLOBIN: 10.4 g/dL — AB (ref 12.0–15.0)
MCH: 28.7 pg (ref 26.0–34.0)
MCHC: 33.3 g/dL (ref 30.0–36.0)
MCV: 86.2 fL (ref 78.0–100.0)
Platelets: 136 10*3/uL — ABNORMAL LOW (ref 150–400)
RBC: 3.62 MIL/uL — AB (ref 3.87–5.11)
RDW: 13.4 % (ref 11.5–15.5)
WBC: 4.1 10*3/uL (ref 4.0–10.5)

## 2015-10-25 LAB — GLUCOSE, CAPILLARY: Glucose-Capillary: 109 mg/dL — ABNORMAL HIGH (ref 65–99)

## 2015-10-25 LAB — PREPARE RBC (CROSSMATCH)

## 2015-10-25 LAB — APTT: aPTT: 35 seconds (ref 24–37)

## 2015-10-25 SURGERY — IMPLANTATION, AORTIC VALVE, TRANSCATHETER, FEMORAL APPROACH
Anesthesia: General | Site: Groin | Laterality: Right

## 2015-10-25 MED ORDER — ROCURONIUM BROMIDE 100 MG/10ML IV SOLN
INTRAVENOUS | Status: DC | PRN
  Administered 2015-10-25: 5 mg via INTRAVENOUS

## 2015-10-25 MED ORDER — PANTOPRAZOLE SODIUM 40 MG PO TBEC
40.0000 mg | DELAYED_RELEASE_TABLET | Freq: Every day | ORAL | Status: DC
  Administered 2015-10-27 – 2015-10-28 (×2): 40 mg via ORAL
  Filled 2015-10-25 (×2): qty 1

## 2015-10-25 MED ORDER — METOPROLOL TARTRATE 12.5 MG HALF TABLET
12.5000 mg | ORAL_TABLET | Freq: Two times a day (BID) | ORAL | Status: DC
  Administered 2015-10-25: 12.5 mg via ORAL
  Filled 2015-10-25: qty 1

## 2015-10-25 MED ORDER — PHENYLEPHRINE HCL 10 MG/ML IJ SOLN
10.0000 mg | INTRAVENOUS | Status: DC | PRN
  Administered 2015-10-25: 100 ug/min via INTRAVENOUS

## 2015-10-25 MED ORDER — NITROGLYCERIN IN D5W 200-5 MCG/ML-% IV SOLN: 0.0000 ug/min | INTRAVENOUS | Status: DC

## 2015-10-25 MED ORDER — ALBUTEROL SULFATE (2.5 MG/3ML) 0.083% IN NEBU: 2.5000 mg | INHALATION_SOLUTION | RESPIRATORY_TRACT | Status: DC | PRN

## 2015-10-25 MED ORDER — LACTATED RINGERS IV SOLN
INTRAVENOUS | Status: DC | PRN
  Administered 2015-10-25: 11:00:00 via INTRAVENOUS

## 2015-10-25 MED ORDER — ONDANSETRON HCL 4 MG/2ML IJ SOLN: 4.0000 mg | Freq: Four times a day (QID) | INTRAMUSCULAR | Status: DC | PRN

## 2015-10-25 MED ORDER — POTASSIUM CHLORIDE 10 MEQ/50ML IV SOLN
10.0000 meq | INTRAVENOUS | Status: AC
  Administered 2015-10-25 (×3): 10 meq via INTRAVENOUS

## 2015-10-25 MED ORDER — ASPIRIN EC 325 MG PO TBEC
325.0000 mg | DELAYED_RELEASE_TABLET | Freq: Every day | ORAL | Status: DC
  Administered 2015-10-26 – 2015-10-28 (×3): 325 mg via ORAL
  Filled 2015-10-25 (×3): qty 1

## 2015-10-25 MED ORDER — ACETAMINOPHEN 160 MG/5ML PO SOLN: 1000.0000 mg | Freq: Four times a day (QID) | ORAL | Status: DC

## 2015-10-25 MED ORDER — TRAMADOL HCL 50 MG PO TABS: 50.0000 mg | ORAL_TABLET | ORAL | Status: DC | PRN

## 2015-10-25 MED ORDER — LACTATED RINGERS IV SOLN: 500.0000 mL | Freq: Once | INTRAVENOUS | Status: DC | PRN

## 2015-10-25 MED ORDER — PROTAMINE SULFATE 10 MG/ML IV SOLN
INTRAVENOUS | Status: DC | PRN
  Administered 2015-10-25: 30 mg via INTRAVENOUS
  Administered 2015-10-25: 20 mg via INTRAVENOUS
  Administered 2015-10-25 (×2): 30 mg via INTRAVENOUS

## 2015-10-25 MED ORDER — ONDANSETRON HCL 4 MG/2ML IJ SOLN
INTRAMUSCULAR | Status: AC
  Filled 2015-10-25: qty 2

## 2015-10-25 MED ORDER — ACETAMINOPHEN 500 MG PO TABS
1000.0000 mg | ORAL_TABLET | Freq: Four times a day (QID) | ORAL | Status: DC
  Administered 2015-10-26 – 2015-10-27 (×7): 1000 mg via ORAL
  Filled 2015-10-25 (×8): qty 2

## 2015-10-25 MED ORDER — ALBUMIN HUMAN 5 % IV SOLN: 250.0000 mL | INTRAVENOUS | Status: AC | PRN

## 2015-10-25 MED ORDER — PHENYLEPHRINE HCL 10 MG/ML IJ SOLN
INTRAMUSCULAR | Status: DC | PRN
  Administered 2015-10-25 (×2): 200 ug via INTRAVENOUS

## 2015-10-25 MED ORDER — CHLORHEXIDINE GLUCONATE 0.12 % MT SOLN
15.0000 mL | Freq: Once | OROMUCOSAL | Status: AC
  Administered 2015-10-25: 15 mL via OROMUCOSAL
  Filled 2015-10-25: qty 15

## 2015-10-25 MED ORDER — PHENYLEPHRINE HCL 10 MG/ML IJ SOLN
0.0000 ug/min | INTRAVENOUS | Status: DC
  Filled 2015-10-25: qty 2

## 2015-10-25 MED ORDER — METOPROLOL TARTRATE 25 MG/10 ML ORAL SUSPENSION: 12.5000 mg | Freq: Two times a day (BID) | ORAL | Status: DC

## 2015-10-25 MED ORDER — METOPROLOL TARTRATE 5 MG/5ML IV SOLN: 2.5000 mg | INTRAVENOUS | Status: DC | PRN

## 2015-10-25 MED ORDER — SODIUM CHLORIDE 0.9 % IV SOLN: 10.0000 mL/h | Freq: Once | INTRAVENOUS | Status: DC

## 2015-10-25 MED ORDER — SODIUM CHLORIDE 0.9 % IV SOLN
INTRAVENOUS | Status: DC
  Filled 2015-10-25: qty 2.5

## 2015-10-25 MED ORDER — CHLORHEXIDINE GLUCONATE 4 % EX LIQD: 60.0000 mL | Freq: Once | CUTANEOUS | Status: DC

## 2015-10-25 MED ORDER — MAGNESIUM SULFATE 4 GM/100ML IV SOLN
4.0000 g | Freq: Once | INTRAVENOUS | Status: AC
  Administered 2015-10-25: 4 g via INTRAVENOUS
  Filled 2015-10-25: qty 100

## 2015-10-25 MED ORDER — ROCURONIUM BROMIDE 50 MG/5ML IV SOLN
INTRAVENOUS | Status: AC
  Filled 2015-10-25: qty 2

## 2015-10-25 MED ORDER — ASPIRIN 81 MG PO CHEW
324.0000 mg | CHEWABLE_TABLET | Freq: Every day | ORAL | Status: DC
  Filled 2015-10-25: qty 4

## 2015-10-25 MED ORDER — 0.9 % SODIUM CHLORIDE (POUR BTL) OPTIME
TOPICAL | Status: DC | PRN
  Administered 2015-10-25: 3000 mL

## 2015-10-25 MED ORDER — IODIXANOL 320 MG/ML IV SOLN
INTRAVENOUS | Status: DC | PRN
  Administered 2015-10-25: 112.9 mL via INTRAVENOUS

## 2015-10-25 MED ORDER — ACETAMINOPHEN 650 MG RE SUPP: 650.0000 mg | Freq: Once | RECTAL | Status: DC

## 2015-10-25 MED ORDER — LIDOCAINE 2% (20 MG/ML) 5 ML SYRINGE
INTRAMUSCULAR | Status: AC
  Filled 2015-10-25: qty 5

## 2015-10-25 MED ORDER — CHLORHEXIDINE GLUCONATE 4 % EX LIQD: 30.0000 mL | CUTANEOUS | Status: DC

## 2015-10-25 MED ORDER — HEPARIN SODIUM (PORCINE) 1000 UNIT/ML IJ SOLN
INTRAMUSCULAR | Status: DC | PRN
  Administered 2015-10-25: 8000 [IU] via INTRAVENOUS

## 2015-10-25 MED ORDER — INSULIN REGULAR BOLUS VIA INFUSION
0.0000 [IU] | Freq: Three times a day (TID) | INTRAVENOUS | Status: DC
  Filled 2015-10-25: qty 10

## 2015-10-25 MED ORDER — AMLODIPINE BESYLATE 5 MG PO TABS
2.5000 mg | ORAL_TABLET | Freq: Every day | ORAL | Status: DC
  Administered 2015-10-26 – 2015-10-28 (×3): 2.5 mg via ORAL
  Filled 2015-10-25 (×3): qty 1

## 2015-10-25 MED ORDER — ROCURONIUM BROMIDE 50 MG/5ML IV SOLN
INTRAVENOUS | Status: AC
  Filled 2015-10-25: qty 1

## 2015-10-25 MED ORDER — CHLORHEXIDINE GLUCONATE 0.12 % MT SOLN
15.0000 mL | OROMUCOSAL | Status: AC
  Administered 2015-10-25: 15 mL via OROMUCOSAL
  Filled 2015-10-25: qty 15

## 2015-10-25 MED ORDER — PROPOFOL 10 MG/ML IV BOLUS
INTRAVENOUS | Status: AC
  Filled 2015-10-25: qty 20

## 2015-10-25 MED ORDER — SUGAMMADEX SODIUM 200 MG/2ML IV SOLN
INTRAVENOUS | Status: DC | PRN
Start: 2015-10-25 — End: 2015-10-25
  Administered 2015-10-25: 200 mg via INTRAVENOUS

## 2015-10-25 MED ORDER — MORPHINE SULFATE (PF) 2 MG/ML IV SOLN
2.0000 mg | INTRAVENOUS | Status: DC | PRN
  Administered 2015-10-25: 2 mg via INTRAVENOUS
  Filled 2015-10-25: qty 1

## 2015-10-25 MED ORDER — MORPHINE SULFATE (PF) 2 MG/ML IV SOLN: 1.0000 mg | INTRAVENOUS | Status: AC | PRN

## 2015-10-25 MED ORDER — SODIUM CHLORIDE 0.9 % IV SOLN
INTRAVENOUS | Status: DC | PRN
  Administered 2015-10-25: 1500 mL

## 2015-10-25 MED ORDER — DEXTROSE 5 % IV SOLN
1.5000 g | Freq: Two times a day (BID) | INTRAVENOUS | Status: AC
  Administered 2015-10-25 – 2015-10-27 (×4): 1.5 g via INTRAVENOUS
  Filled 2015-10-25 (×5): qty 1.5

## 2015-10-25 MED ORDER — OXYCODONE HCL 5 MG PO TABS: 5.0000 mg | ORAL_TABLET | ORAL | Status: DC | PRN

## 2015-10-25 MED ORDER — EPHEDRINE SULFATE 50 MG/ML IJ SOLN
INTRAMUSCULAR | Status: DC | PRN
  Administered 2015-10-25: 15 mg via INTRAVENOUS
  Administered 2015-10-25: 5 mg via INTRAVENOUS
  Administered 2015-10-25: 10 mg via INTRAVENOUS

## 2015-10-25 MED ORDER — SODIUM CHLORIDE 0.9 % IV SOLN: INTRAVENOUS | Status: DC

## 2015-10-25 MED ORDER — MIDAZOLAM HCL 2 MG/2ML IJ SOLN: 2.0000 mg | INTRAMUSCULAR | Status: DC | PRN

## 2015-10-25 MED ORDER — DEXMEDETOMIDINE HCL IN NACL 200 MCG/50ML IV SOLN
0.1000 ug/kg/h | INTRAVENOUS | Status: DC
  Filled 2015-10-25: qty 50

## 2015-10-25 MED ORDER — VANCOMYCIN HCL IN DEXTROSE 1-5 GM/200ML-% IV SOLN
1000.0000 mg | Freq: Once | INTRAVENOUS | Status: AC
  Administered 2015-10-25: 1000 mg via INTRAVENOUS
  Filled 2015-10-25: qty 200

## 2015-10-25 MED ORDER — FAMOTIDINE IN NACL 20-0.9 MG/50ML-% IV SOLN
20.0000 mg | Freq: Two times a day (BID) | INTRAVENOUS | Status: AC
  Administered 2015-10-25: 20 mg via INTRAVENOUS

## 2015-10-25 MED ORDER — PROTAMINE SULFATE 10 MG/ML IV SOLN
INTRAVENOUS | Status: AC
  Filled 2015-10-25: qty 5

## 2015-10-25 MED ORDER — PROPOFOL 10 MG/ML IV BOLUS
INTRAVENOUS | Status: DC | PRN
  Administered 2015-10-25: 30 mg via INTRAVENOUS
  Administered 2015-10-25 (×2): 40 mg via INTRAVENOUS
  Administered 2015-10-25: 30 mg via INTRAVENOUS

## 2015-10-25 MED ORDER — ONDANSETRON HCL 4 MG/2ML IJ SOLN
INTRAMUSCULAR | Status: DC | PRN
  Administered 2015-10-25: 4 mg via INTRAVENOUS

## 2015-10-25 MED ORDER — ACETAMINOPHEN 160 MG/5ML PO SOLN: 650.0000 mg | Freq: Once | ORAL | Status: DC

## 2015-10-25 MED ORDER — SODIUM CHLORIDE 0.9 % IV SOLN
INTRAVENOUS | Status: AC
  Administered 2015-10-25: 16:00:00 via INTRAVENOUS

## 2015-10-25 MED ORDER — FENTANYL CITRATE (PF) 100 MCG/2ML IJ SOLN
INTRAMUSCULAR | Status: DC | PRN
  Administered 2015-10-25: 100 ug via INTRAVENOUS

## 2015-10-25 MED ORDER — FENTANYL CITRATE (PF) 250 MCG/5ML IJ SOLN
INTRAMUSCULAR | Status: AC
  Filled 2015-10-25: qty 5

## 2015-10-25 MED FILL — Heparin Sodium (Porcine) Inj 1000 Unit/ML: INTRAMUSCULAR | Qty: 30 | Status: AC

## 2015-10-25 MED FILL — Potassium Chloride Inj 2 mEq/ML: INTRAVENOUS | Qty: 40 | Status: AC

## 2015-10-25 MED FILL — Magnesium Sulfate Inj 50%: INTRAMUSCULAR | Qty: 10 | Status: AC

## 2015-10-25 SURGICAL SUPPLY — 99 items
ADAPTER UNIV SWAN GANZ BIP (ADAPTER) ×2 IMPLANT
ADAPTER UNV SWAN GANZ BIP (ADAPTER) ×1
BAG BANDED W/RUBBER/TAPE 36X54 (MISCELLANEOUS) ×3 IMPLANT
BAG DECANTER FOR FLEXI CONT (MISCELLANEOUS) IMPLANT
BAG SNAP BAND KOVER 36X36 (MISCELLANEOUS) ×6 IMPLANT
BLADE OSCILLATING /SAGITTAL (BLADE) IMPLANT
BLADE STERNUM SYSTEM 6 (BLADE) IMPLANT
BLADE SURG ROTATE 9660 (MISCELLANEOUS) ×3 IMPLANT
CABLE PACING FASLOC BIEGE (MISCELLANEOUS) ×3 IMPLANT
CABLE PACING FASLOC BLUE (MISCELLANEOUS) IMPLANT
CANNULA FEM VENOUS REMOTE 22FR (CANNULA) IMPLANT
CANNULA OPTISITE PERFUSION 16F (CANNULA) IMPLANT
CANNULA OPTISITE PERFUSION 18F (CANNULA) IMPLANT
CATH S G BIP PACING (SET/KITS/TRAYS/PACK) ×6 IMPLANT
CATH STRAIGHT 5FR 65CM (CATHETERS) ×3 IMPLANT
CLIP TI MEDIUM 24 (CLIP) IMPLANT
CLIP TI WIDE RED SMALL 24 (CLIP) IMPLANT
CONT SPEC STER OR (MISCELLANEOUS) ×9 IMPLANT
COVER BACK TABLE 24X17X13 BIG (DRAPES) ×3 IMPLANT
COVER DOME SNAP 22 D (MISCELLANEOUS) ×3 IMPLANT
COVER MAYO STAND STRL (DRAPES) ×3 IMPLANT
COVER TABLE BACK 60X90 (DRAPES) IMPLANT
CRADLE DONUT ADULT HEAD (MISCELLANEOUS) ×3 IMPLANT
DERMABOND ADVANCED (GAUZE/BANDAGES/DRESSINGS) ×2
DERMABOND ADVANCED .7 DNX12 (GAUZE/BANDAGES/DRESSINGS) ×4 IMPLANT
DEVICE CLOSURE PERCLS PRGLD 6F (VASCULAR PRODUCTS) ×4 IMPLANT
DRAPE INCISE IOBAN 66X45 STRL (DRAPES) IMPLANT
DRAPE SLUSH MACHINE 52X66 (DRAPES) ×3 IMPLANT
DRAPE TABLE COVER HEAVY DUTY (DRAPES) ×3 IMPLANT
DRSG OPSITE 6X11 MED (GAUZE/BANDAGES/DRESSINGS) ×3 IMPLANT
DRSG TEGADERM 4X4.75 (GAUZE/BANDAGES/DRESSINGS) ×3 IMPLANT
ELECT REM PT RETURN 9FT ADLT (ELECTROSURGICAL) ×6
ELECTRODE REM PT RTRN 9FT ADLT (ELECTROSURGICAL) ×4 IMPLANT
FELT TEFLON 6X6 (MISCELLANEOUS) IMPLANT
FEMORAL VENOUS CANN RAP (CANNULA) IMPLANT
GAUZE SPONGE 4X4 12PLY STRL (GAUZE/BANDAGES/DRESSINGS) ×3 IMPLANT
GLOVE BIO SURGEON STRL SZ7.5 (GLOVE) ×3 IMPLANT
GLOVE BIO SURGEON STRL SZ8 (GLOVE) IMPLANT
GLOVE BIOGEL PI IND STRL 6.5 (GLOVE) ×12 IMPLANT
GLOVE BIOGEL PI INDICATOR 6.5 (GLOVE) ×6
GLOVE ECLIPSE 7.5 STRL STRAW (GLOVE) ×6 IMPLANT
GLOVE ECLIPSE 8.0 STRL XLNG CF (GLOVE) ×6 IMPLANT
GLOVE EUDERMIC 7 POWDERFREE (GLOVE) ×6 IMPLANT
GLOVE ORTHO TXT STRL SZ7.5 (GLOVE) IMPLANT
GOWN STRL REUS W/ TWL LRG LVL3 (GOWN DISPOSABLE) ×8 IMPLANT
GOWN STRL REUS W/ TWL XL LVL3 (GOWN DISPOSABLE) ×14 IMPLANT
GOWN STRL REUS W/TWL LRG LVL3 (GOWN DISPOSABLE) ×4
GOWN STRL REUS W/TWL XL LVL3 (GOWN DISPOSABLE) ×7
GUIDEWIRE SAF TJ AMPL .035X180 (WIRE) ×3 IMPLANT
GUIDEWIRE SAFE TJ AMPLATZ EXST (WIRE) ×3 IMPLANT
GUIDEWIRE STRAIGHT .035 260CM (WIRE) ×3 IMPLANT
INSERT FOGARTY 61MM (MISCELLANEOUS) IMPLANT
INSERT FOGARTY SM (MISCELLANEOUS) IMPLANT
INSERT FOGARTY XLG (MISCELLANEOUS) IMPLANT
KIT BASIN OR (CUSTOM PROCEDURE TRAY) ×3 IMPLANT
KIT DILATOR VASC 18G NDL (KITS) IMPLANT
KIT HEART LEFT (KITS) ×3 IMPLANT
KIT ROOM TURNOVER OR (KITS) ×3 IMPLANT
KIT SUCTION CATH 14FR (SUCTIONS) IMPLANT
NEEDLE PERC 18GX7CM (NEEDLE) ×3 IMPLANT
NS IRRIG 1000ML POUR BTL (IV SOLUTION) ×15 IMPLANT
PACK AORTA (CUSTOM PROCEDURE TRAY) ×3 IMPLANT
PAD ARMBOARD 7.5X6 YLW CONV (MISCELLANEOUS) ×6 IMPLANT
PAD ELECT DEFIB RADIOL ZOLL (MISCELLANEOUS) ×3 IMPLANT
PERCLOSE PROGLIDE 6F (VASCULAR PRODUCTS) ×6
SET MICROPUNCTURE 5F STIFF (MISCELLANEOUS) ×3 IMPLANT
SHEATH AVANTI 11CM 8FR (MISCELLANEOUS) ×3 IMPLANT
SHEATH BRITE TIP 6FR 35CM (SHEATH) ×3 IMPLANT
SHEATH PINNACLE 6F 10CM (SHEATH) ×6 IMPLANT
SLEEVE REPOSITIONING LENGTH 30 (MISCELLANEOUS) ×3 IMPLANT
SPONGE GAUZE 4X4 12PLY STER LF (GAUZE/BANDAGES/DRESSINGS) ×3 IMPLANT
SPONGE LAP 4X18 X RAY DECT (DISPOSABLE) ×3 IMPLANT
STOPCOCK MORSE 400PSI 3WAY (MISCELLANEOUS) ×9 IMPLANT
SUT ETHIBOND X763 2 0 SH 1 (SUTURE) IMPLANT
SUT GORETEX CV 4 TH 22 36 (SUTURE) IMPLANT
SUT GORETEX CV4 TH-18 (SUTURE) IMPLANT
SUT GORETEX TH-18 36 INCH (SUTURE) IMPLANT
SUT PROLENE 3 0 SH1 36 (SUTURE) IMPLANT
SUT PROLENE 4 0 RB 1 (SUTURE)
SUT PROLENE 4-0 RB1 .5 CRCL 36 (SUTURE) IMPLANT
SUT PROLENE 5 0 C 1 36 (SUTURE) IMPLANT
SUT PROLENE 6 0 C 1 30 (SUTURE) IMPLANT
SUT SILK  1 MH (SUTURE)
SUT SILK 1 MH (SUTURE) IMPLANT
SUT SILK 2 0 SH CR/8 (SUTURE) IMPLANT
SUT VIC AB 2-0 CT1 27 (SUTURE)
SUT VIC AB 2-0 CT1 TAPERPNT 27 (SUTURE) IMPLANT
SUT VIC AB 2-0 CTX 36 (SUTURE) IMPLANT
SUT VIC AB 3-0 SH 8-18 (SUTURE) IMPLANT
SYR 30ML LL (SYRINGE) ×6 IMPLANT
SYR 50ML LL SCALE MARK (SYRINGE) ×3 IMPLANT
SYRINGE 10CC LL (SYRINGE) ×6 IMPLANT
TOWEL OR 17X26 10 PK STRL BLUE (TOWEL DISPOSABLE) ×6 IMPLANT
TRANSDUCER W/STOPCOCK (MISCELLANEOUS) ×6 IMPLANT
TRAY FOLEY IC TEMP SENS 16FR (CATHETERS) ×3 IMPLANT
TUBING HIGH PRESSURE 120CM (CONNECTOR) ×3 IMPLANT
VALVE HEART TRANSCATH SZ3 29MM (Prosthesis & Implant Heart) ×3 IMPLANT
WIRE AMPLATZ SS-J .035X180CM (WIRE) ×6 IMPLANT
WIRE BENTSON .035X145CM (WIRE) ×3 IMPLANT

## 2015-10-25 NOTE — Progress Notes (Signed)
  Echocardiogram Echocardiogram Transesophageal has been performed.  Jennette Dubin 10/25/2015, 1:34 PM

## 2015-10-25 NOTE — Anesthesia Preprocedure Evaluation (Addendum)
Anesthesia Evaluation  Patient identified by MRN, date of birth, ID band Patient awake    Reviewed: Allergy & Precautions, NPO status , Patient's Chart, lab work & pertinent test results, reviewed documented beta blocker date and time   History of Anesthesia Complications Negative for: history of anesthetic complications  Airway Mallampati: I  TM Distance: >3 FB     Dental  (+) Edentulous Upper, Edentulous Lower, Dental Advisory Given   Pulmonary shortness of breath, asthma ,    breath sounds clear to auscultation       Cardiovascular hypertension, Pt. on medications +CHF  + Valvular Problems/Murmurs  Rhythm:Regular + Systolic murmurs    Neuro/Psych negative neurological ROS  negative psych ROS   GI/Hepatic negative GI ROS, Neg liver ROS,   Endo/Other  negative endocrine ROS  Renal/GU negative Renal ROS     Musculoskeletal  (+) Arthritis ,   Abdominal   Peds  Hematology negative hematology ROS (+)   Anesthesia Other Findings   Reproductive/Obstetrics                            Anesthesia Physical Anesthesia Plan  ASA: IV  Anesthesia Plan:    Post-op Pain Management:    Induction: Intravenous  Airway Management Planned: Oral ETT  Additional Equipment: Arterial line, Ultrasound Guidance Line Placement and CVP  Intra-op Plan:   Post-operative Plan: Extubation in OR and Possible Post-op intubation/ventilation  Informed Consent: I have reviewed the patients History and Physical, chart, labs and discussed the procedure including the risks, benefits and alternatives for the proposed anesthesia with the patient or authorized representative who has indicated his/her understanding and acceptance.   Dental advisory given  Plan Discussed with: Surgeon and CRNA  Anesthesia Plan Comments:         Anesthesia Quick Evaluation

## 2015-10-25 NOTE — Interval H&P Note (Signed)
History and Physical Interval Note:  10/25/2015 10:31 AM  Nickola W Difonzo  has presented today for surgery, with the diagnosis of SEVERE AS  The various methods of treatment have been discussed with the patient and family. After consideration of risks, benefits and other options for treatment, the patient has consented to  Procedure(s): TRANSCATHETER AORTIC VALVE REPLACEMENT, TRANSFEMORAL (N/A) TRANSESOPHAGEAL ECHOCARDIOGRAM (TEE) (N/A) as a surgical intervention .  The patient's history has been reviewed, patient examined, no change in status, stable for surgery.  I have reviewed the patient's chart and labs.  Questions were answered to the patient's satisfaction.     Gaye Pollack

## 2015-10-25 NOTE — Anesthesia Postprocedure Evaluation (Signed)
Anesthesia Post Note  Patient: Crystal Cooper  Procedure(s) Performed: Procedure(s) (LRB): TRANSCATHETER AORTIC VALVE REPLACEMENT, TRANSFEMORAL (Right) TRANSESOPHAGEAL ECHOCARDIOGRAM (TEE) (N/A)  Patient location during evaluation: ICU Anesthesia Type: General Level of consciousness: awake and alert Pain management: pain level controlled Vital Signs Assessment: post-procedure vital signs reviewed and stable Respiratory status: spontaneous breathing, nonlabored ventilation and respiratory function stable Cardiovascular status: blood pressure returned to baseline and stable Postop Assessment: no signs of nausea or vomiting Anesthetic complications: no    Last Vitals:  Filed Vitals:   10/25/15 2000 10/25/15 2100  BP: 140/60 154/64  Pulse: 50 64  Temp: 37 C 37 C  Resp: 19 19    Last Pain:  Filed Vitals:   10/25/15 2124  PainSc: 5                  Berdell Nevitt A

## 2015-10-25 NOTE — Transfer of Care (Signed)
Immediate Anesthesia Transfer of Care Note  Patient: Crystal Cooper  Procedure(s) Performed: Procedure(s): TRANSCATHETER AORTIC VALVE REPLACEMENT, TRANSFEMORAL (Right) TRANSESOPHAGEAL ECHOCARDIOGRAM (TEE) (N/A)  Patient Location: PACU  Anesthesia Type:General  Level of Consciousness: awake and alert   Airway & Oxygen Therapy: Patient Spontanous Breathing and Patient connected to nasal cannula oxygen  Post-op Assessment: Report given to RN  Post vital signs: Reviewed and stable  Last Vitals:  Filed Vitals:   10/25/15 0829  BP: 176/71  Pulse: 57  Temp: 36.4 C  Resp: 18    Last Pain: There were no vitals filed for this visit.       Complications: No apparent anesthesia complications

## 2015-10-25 NOTE — Progress Notes (Signed)
Patient ID: Crystal Cooper, female   DOB: [birth year], 82 y.o.   MRN: [id number]   SICU Evening Rounds:   Hemodynamically stable, atrial flutter 60, temp pacer in place on backup Awake and alert, neuro intact   Urine output good   Groin sites look good.  CBC    Component Value Date/Time   WBC 4.1 10/25/2015 1515   RBC 3.62* 10/25/2015 1515   HGB 11.9* 10/25/2015 1518   HCT 35.0* 10/25/2015 1518   PLT 136* 10/25/2015 1515   MCV 86.2 10/25/2015 1515   MCH 28.7 10/25/2015 1515   MCHC 33.3 10/25/2015 1515   RDW 13.4 10/25/2015 1515   LYMPHSABS 1.6 09/16/2015 1501   MONOABS 0.6 09/16/2015 1501   EOSABS 0.1 09/16/2015 1501   BASOSABS 0.0 09/16/2015 1501     BMET    Component Value Date/Time   NA 136 10/25/2015 1518   K 3.1* 10/25/2015 1518   CL 97* 10/25/2015 1518   CO2 23 10/21/2015 1532   GLUCOSE 132* 10/25/2015 1518   BUN 11 10/25/2015 1518   CREATININE 0.60 10/25/2015 1518   CREATININE 0.92* 09/16/2015 1501   CALCIUM 9.0 10/21/2015 1532   GFRNONAA >60 10/21/2015 1532   GFRAA >60 10/21/2015 1532     A/P:  Stable postop course. Continue current plans

## 2015-10-25 NOTE — CV Procedure (Signed)
[facility]  TAVR OPERATIVE NOTE   Date of Procedure:  10/25/2015  Preoperative Diagnosis: Severe Aortic Stenosis   Postoperative Diagnosis: Same   Procedure:    Transcatheter Aortic Valve Replacement - Transfemoral Approach  Edwards Sapien 3 THV (size 29 mm, model # 9600TFX, serial # [id number])   Co-Surgeons:  Gilford Raid, MD and Lauree Chandler, MD  Anesthesiologist:                  Ermalene Postin  Echocardiographer:  Meda Coffee  Pre-operative Echo Findings:  Severe aortic stenosis  Normal left ventricular systolic function  Post-operative Echo Findings:  Trivial paravalvular leak  Normal left ventricular systolic function  BRIEF CLINICAL NOTE AND INDICATIONS FOR SURGERY  82 yo female with history of severe aortic valve stenosis, HTN, persistent atrial fibrillation on Eliquis, asthma, SVT, chronic diastolic CHF who is here today for TAVr. She was referred to valve clinic today for further discussion regarding her aortic valve stenosis and possible TAVR. She is followed by Dr. Stanford Breed. She has been followed for many years for aortic stenosis. Most recent echo 08/12/15 with normal LV systolic function, severe aortic stenosis. The aortic valve is thickened and calcified with mean gradient of 43 mm Hg, peak gradient 71 mmHg, AVA 0.44 cm2. There is mild mitral valve regurgitation and moderate tricuspid regurgitation. She has had progressive dyspnea over the last 6 months. She has been on Lasix but her dyspnea is worsening. No chest pain or syncope. No dizziness. She lives alone. She does her own cooking and cleaning. She still drives. She is a widow. Her daughter reports no memory issues. She is very functional. CT scans show suitable anatomy for femoral artery access and use of a 29 mm valve.   During the course of the patient's preoperative work up they have been evaluated comprehensively by a multidisciplinary team of specialists coordinated through the [facility] in the [facility].  They have been demonstrated to suffer from symptomatic severe aortic stenosis as noted above. The patient has been counseled extensively as to the relative risks and benefits of all options for the treatment of severe aortic stenosis including long term medical therapy, conventional surgery for aortic valve replacement, and transcatheter aortic valve replacement.  The patient has been independently evaluated by two cardiac surgeons including Dr Roxy Manns and Dr. Cyndia Bent, and they are felt to be at high risk for conventional surgical aortic valve replacement. Both surgeons indicated the patient would be a poor candidate for conventional surgery given advanced age and other comorbidities. Based upon review of all of the patient's preoperative diagnostic tests they are felt to be candidate for transcatheter aortic valve replacement using the transfemoral approach as an alternative to high risk conventional surgery.    Following the decision to proceed with transcatheter aortic valve replacement, a discussion has been held regarding what types of management strategies would be attempted intraoperatively in the event of life-threatening complications, including whether or not the patient would be considered a candidate for the use of cardiopulmonary bypass and/or conversion to open sternotomy for attempted surgical intervention.  The patient has been advised of a variety of complications that might develop peculiar to this approach including but not limited to risks of death, stroke, paravalvular leak, aortic dissection or other major vascular complications, aortic annulus rupture, device embolization, cardiac rupture or perforation, acute myocardial infarction, arrhythmia, heart block or bradycardia requiring permanent pacemaker placement, congestive heart failure, respiratory failure, renal failure,  pneumonia, infection, other late complications related to  structural valve deterioration or migration, or other complications that might ultimately cause a temporary or permanent loss of functional independence or other long term morbidity.  The patient provides full informed consent for the procedure as described and all questions were answered preoperatively.   DETAILS OF THE OPERATIVE PROCEDURE  PREPARATION:   The patient is brought to the operating room on the above mentioned date and central monitoring was established by the anesthesia team including placement of Swan-Ganz catheter and radial arterial line. The patient is placed in the supine position on the operating table.  Intravenous antibiotics are administered. General endotracheal anesthesia is induced uneventfully. A Foley catheter is placed.  Baseline transesophageal echocardiogram was performed. The patient's chest, abdomen, both groins, and both lower extremities are prepared and draped in a sterile manner. A time out procedure is performed.   PERIPHERAL ACCESS:   Using the modified Seldinger technique, femoral arterial and venous access were obtained with placement of 6 Fr sheaths on the lef side.  A pigtail diagnostic catheter was passed through the femoral arterial sheath under fluoroscopic guidance into the aortic root.  A temporary transvenous pacemaker catheter was passed through the femoral venous sheath under fluoroscopic guidance into the right ventricle.  The pacemaker was tested to ensure stable lead placement and pacemaker capture. Aortic root angiography was performed in order to determine the optimal angiographic angle for valve deployment.  TRANSFEMORAL ACCESS:  A percutaneous technique was used for access. A micro-puncture needle was used to gain access into the right femoral artery using the modified Seldinger technique. Appropriate location of the arterial stick was confirmed with contrast injection through the small micropuncture catheter before the largeer sheath was  placed. Double ProGlide devices were deployed before sheath insertion. The patient was heparinized systemically and ACT verified > 250 seconds.    A 16 Fr transfemoral E-sheath was introduced into the right femoral artery after progressively dilating over an Amplatz superstiff wire. An AL-1 catheter was used to direct a straight-tip exchange length wire across the native aortic valve into the left ventricle. This was exchanged out for a pigtail catheter and position was confirmed in the LV apex. Simultaneous LV and Ao pressures were recorded.  The pigtail catheter was then exchanged for an Amplatz Extra-stiff wire in the LV apex. At that point, BAV was performed using a 26 mm valvuloplasty balloon.  Once optimal position was achieved, BAV was done under rapid ventricular pacing at 180 bpm. The patient recovered well hemodynamically.   TRANSCATHETER HEART VALVE DEPLOYMENT:  An Edwards Sapien 3 THV (size 29 mm) was prepared and crimped per manufacturer's guidelines, and the proper orientation of the valve is confirmed on the [employer] delivery system. The valve was advanced through the introducer sheath using normal technique until in an appropriate position in the abdominal aorta beyond the sheath tip. The balloon was then retracted and using the fine-tuning wheel was centered on the valve. The valve was then advanced across the aortic arch using appropriate flexion of the catheter. The valve was carefully positioned across the aortic valve annulus. The Commander catheter was retracted using normal technique. Once final position of the valve has been confirmed by angiographic assessment, the valve is deployed while temporarily holding ventilation and during rapid ventricular pacing to maintain systolic blood pressure < 50 mmHg and pulse pressure < 10 mmHg. The balloon inflation is held for >3 seconds after reaching full deployment volume. Once the balloon has fully  deflated the balloon is retracted into  the ascending aorta and valve function is assessed using TEE. There is felt to be trivial paravalvular leak and no central aortic insufficiency.  The patient's hemodynamic recovery following valve deployment is good.  The deployment balloon and guidewire are both removed. Echo demostrated acceptable post-procedural gradients, stable mitral valve function, and no AI.   PROCEDURE COMPLETION:  The sheath was then removed and the double ProGlide sutures were pulled down to the arterial wall and closure was completed. Protamine was administered once femoral arterial repair was complete. The temporary pacemaker, pigtail catheters and femoral sheaths were removed with manual pressure used for hemostasis.   The patient tolerated the procedure well and is transported to the surgical intensive care in stable condition. There were no immediate intraoperative complications. All sponge instrument and needle counts are verified correct at completion of the operation.   No blood products were administered during the operation.  The patient received a total of  112.9 mL of intravenous contrast during the procedure.  Dellia Donnelly MD 10/25/2015 11:27 AM

## 2015-10-25 NOTE — Anesthesia Procedure Notes (Addendum)
Procedure Name: Intubation Date/Time: 10/25/2015 11:42 AM Performed by: Sampson Si E Pre-anesthesia Checklist: Patient identified, Emergency Drugs available, Suction available, Patient being monitored and Timeout performed Patient Re-evaluated:Patient Re-evaluated prior to inductionOxygen Delivery Method: Circle system utilized Preoxygenation: Pre-oxygenation with 100% oxygen Intubation Type: IV induction Ventilation: Mask ventilation without difficulty and Two handed mask ventilation required Laryngoscope Size: Mac and 3 Grade View: Grade I Tube type: Oral Tube size: 8.0 mm Number of attempts: 1 Airway Equipment and Method: Stylet Placement Confirmation: ETT inserted through vocal cords under direct vision,  positive ETCO2 and breath sounds checked- equal and bilateral Secured at: 21 cm Tube secured with: Tape Dental Injury: Teeth and Oropharynx as per pre-operative assessment    Central Venous Catheter Insertion Performed by: anesthesiologist Patient location: Pre-op. Preanesthetic checklist: patient identified, IV checked, site marked, risks and benefits discussed, surgical consent, monitors and equipment checked, pre-op evaluation, timeout performed and anesthesia consent Position: Trendelenburg Lidocaine 1% used for infiltration Landmarks identified and Seldinger technique used Catheter size: 8 Fr Central line was placed.Double lumen Procedure performed using ultrasound guided technique. Attempts: 1 Following insertion, dressing applied, line sutured and Biopatch. Post procedure assessment: blood return through all ports, free fluid flow and no air. Patient tolerated the procedure well with no immediate complications.

## 2015-10-25 NOTE — Op Note (Signed)
[facility]   MULTIDISCIPLINARY HEART VALVE TEAM   TAVR OPERATIVE NOTE   Date of Procedure:  10/25/2015  Preoperative Diagnosis: Severe Aortic Stenosis   Postoperative Diagnosis: Same   Procedure:    Transcatheter Aortic Valve Replacement - Percutaneous Right Transfemoral Approach  Edwards Sapien 3 THV (size 29 mm, model # 9600TFX, serial # [id number])   Co-Surgeons:  Gaye Pollack, MD and Lauree Chandler, MD   Anesthesiologist:  Laurie Panda, MD  Echocardiographer:  Ena Dawley, MD.  Pre-operative Echo Findings:   severe aortic stenosis   normal left ventricular systolic function   Post-operative Echo Findings:  trivial paravalvular leak  normal left ventricular systolic function    BRIEF CLINICAL NOTE AND INDICATIONS FOR SURGERY  The patient is an 82 year old woman with a history of hypertension, persistent atrial fibrillation on Eliquis and chronic diastolic heart failure. She has been followed for aortic stenosis for many years by Dr. Stanford Breed. She has had progressive dyspnea over the past 6 months. A recent echo on 08/12/2015 showed normal LV function with severe AS with a mean gradient of 43 mm Hg and an AVA of 0.44 cm2. She underwent cardiac cath on 09/26/2015 showing non-obstructive CAD with severe AS. The mean gradient was measured at 20 mm Hg and the AVA at 0.74 cm2.   During the course of the patient's preoperative work up they have been evaluated comprehensively by a multidisciplinary team of specialists coordinated through the [facility] in the [facility].  They have been demonstrated to suffer from symptomatic severe aortic stenosis as noted above. The patient has been counseled extensively as to the relative risks and benefits of all options for the treatment of severe aortic stenosis including long term medical therapy, conventional surgery for aortic valve replacement, and  transcatheter aortic valve replacement.  The patient has been independently evaluated by two cardiac surgeons including myself and Dr. Roxy Manns, and they are felt to be at intermediate risk for conventional surgical aortic valve replacement due to advanced age. Based upon review of all of the patient's preoperative diagnostic tests they are felt to be candidate for transcatheter aortic valve replacement using the transfemoral approach as an alternative to higher risk conventional surgery.    Following the decision to proceed with transcatheter aortic valve replacement, a discussion has been held regarding what types of management strategies would be attempted intraoperatively in the event of life-threatening complications, including whether or not the patient would be considered a candidate for the use of cardiopulmonary bypass and/or conversion to open sternotomy for attempted surgical intervention.  The patient has been advised of a variety of complications that might develop peculiar to this approach including but not limited to risks of death, stroke, paravalvular leak, aortic dissection or other major vascular complications, aortic annulus rupture, device embolization, cardiac rupture or perforation, acute myocardial infarction, arrhythmia, heart block or bradycardia requiring permanent pacemaker placement, congestive heart failure, respiratory failure, renal failure, pneumonia, infection, other late complications related to structural valve deterioration or migration, or other complications that might ultimately cause a temporary or permanent loss of functional independence or other long term morbidity.  The patient provides full informed consent for the procedure as described and all questions were answered preoperatively.    DETAILS OF THE OPERATIVE PROCEDURE  PREPARATION:    The patient is brought to the operating room on the above mentioned date and central monitoring was established by the  anesthesia team including  placement of Swan-Ganz catheter and radial arterial line. The patient is placed in the supine position on the operating table.  Intravenous antibiotics are administered. General endotracheal anesthesia is induced uneventfully. A Foley catheter is placed.  Baseline transesophageal echocardiogram was performed. The patient's chest, abdomen, both groins, and both lower extremities are prepared and draped in a sterile manner. A time out procedure is performed.   Peripheral and transfemoral access is obtained by Dr. Angelena Form and will be dictated in his noted.   BALLOON AORTIC VALVULOPLASTY:   Balloon aortic valvuloplasty was performed using a 26 mm valvuloplasty balloon.  Once optimal position was achieved, BAV was done under rapid ventricular pacing. The patient recovered well hemodynamically.    TRANSCATHETER HEART VALVE DEPLOYMENT:   An Edwards Sapien 3 transcatheter heart valve (size 29 mm, model #9600TFX, serial [id number]) was prepared and crimped per manufacturer's guidelines, and the proper orientation of the valve is confirmed on the [employer] delivery system. The valve was advanced through the introducer sheath using normal technique until in an appropriate position in the abdominal aorta beyond the sheath tip. The balloon was then retracted and using the fine-tuning wheel was centered on the valve. The valve was then advanced across the aortic arch using appropriate flexion of the catheter. The valve was carefully positioned across the aortic valve annulus. The Commander catheter was retracted using normal technique. Once final position of the valve has been confirmed by angiographic assessment, the valve is deployed while temporarily holding ventilation and during rapid ventricular pacing to maintain systolic blood pressure < 50 mmHg and pulse pressure < 10 mmHg. The balloon inflation is held for >3 seconds after reaching full deployment volume. Once the balloon  has fully deflated the balloon is retracted into the ascending aorta and valve function is assessed using echocardiography. There is felt to be trivial paravalvular leak and no central aortic insufficiency.  The patient's hemodynamic recovery following valve deployment is good.  The deployment balloon and guidewire are both removed. Echo demostrated acceptable post-procedural gradients, stable mitral valve function, and no central aortic insufficiency.    PROCEDURE COMPLETION:   The sheath was removed and femoral artery closure performed by Dr Angelena Form. Please see his separate report for details.  Protamine was administered once femoral arterial repair was complete. The temporary pacemaker, pigtail catheters and femoral sheaths were removed with manual pressure used for hemostasis.   The patient tolerated the procedure well and is transported to the surgical intensive care in stable condition. There were no immediate intraoperative complications. All sponge instrument and needle counts are verified correct at completion of the operation.   No blood products were administered during the operation.  The patient received a total of 112.9 mL of intravenous contrast during the procedure.   Gaye Pollack, MD 10/25/2015

## 2015-10-26 ENCOUNTER — Inpatient Hospital Stay (HOSPITAL_COMMUNITY): Payer: Medicare Other

## 2015-10-26 ENCOUNTER — Encounter (HOSPITAL_COMMUNITY)
Admission: RE | Disposition: A | Discharge status: Home Health | Payer: Self-pay | Source: Ambulatory Visit | Attending: Cardiovascular Disease

## 2015-10-26 ENCOUNTER — Other Ambulatory Visit: Payer: Self-pay | Admitting: *Deleted

## 2015-10-26 DIAGNOSIS — R001 Bradycardia, unspecified: Secondary | ICD-10-CM

## 2015-10-26 DIAGNOSIS — Z954 Presence of other heart-valve replacement: Secondary | ICD-10-CM

## 2015-10-26 DIAGNOSIS — I4891 Unspecified atrial fibrillation: Secondary | ICD-10-CM

## 2015-10-26 DIAGNOSIS — I350 Nonrheumatic aortic (valve) stenosis: Secondary | ICD-10-CM

## 2015-10-26 DIAGNOSIS — I483 Typical atrial flutter: Secondary | ICD-10-CM

## 2015-10-26 HISTORY — PX: EP IMPLANTABLE DEVICE: SHX172B

## 2015-10-26 LAB — GLUCOSE, CAPILLARY
GLUCOSE-CAPILLARY: 164 mg/dL — AB (ref 65–99)
GLUCOSE-CAPILLARY: 92 mg/dL (ref 65–99)
GLUCOSE-CAPILLARY: 96 mg/dL (ref 65–99)
GLUCOSE-CAPILLARY: 97 mg/dL (ref 65–99)
Glucose-Capillary: 152 mg/dL — ABNORMAL HIGH (ref 65–99)
Glucose-Capillary: 90 mg/dL (ref 65–99)

## 2015-10-26 LAB — BASIC METABOLIC PANEL
Anion gap: 8 (ref 5–15)
BUN: 6 mg/dL (ref 6–20)
CALCIUM: 8 mg/dL — AB (ref 8.9–10.3)
CHLORIDE: 100 mmol/L — AB (ref 101–111)
CO2: 24 mmol/L (ref 22–32)
CREATININE: 0.75 mg/dL (ref 0.44–1.00)
GFR calc Af Amer: >60 mL/min (ref >60)
GFR calc non Af Amer: >60 mL/min (ref >60)
Glucose, Bld: 113 mg/dL — ABNORMAL HIGH (ref 65–99)
Potassium: 3.7 mmol/L (ref 3.5–5.1)
Sodium: 132 mmol/L — ABNORMAL LOW (ref 135–145)

## 2015-10-26 LAB — CBC
HEMATOCRIT: 30.7 % — AB (ref 36.0–46.0)
HEMOGLOBIN: 10.3 g/dL — AB (ref 12.0–15.0)
MCH: 29.9 pg (ref 26.0–34.0)
MCHC: 33.6 g/dL (ref 30.0–36.0)
MCV: 89.2 fL (ref 78.0–100.0)
Platelets: 122 10*3/uL — ABNORMAL LOW (ref 150–400)
RBC: 3.44 MIL/uL — ABNORMAL LOW (ref 3.87–5.11)
RDW: 13.9 % (ref 11.5–15.5)
WBC: 5 10*3/uL (ref 4.0–10.5)

## 2015-10-26 LAB — ECHOCARDIOGRAM COMPLETE
Height: 62 in
WEIGHTICAEL: 1793.6 [oz_av]

## 2015-10-26 LAB — MAGNESIUM: Magnesium: 2.5 mg/dL — ABNORMAL HIGH (ref 1.7–2.4)

## 2015-10-26 SURGERY — PACEMAKER IMPLANT
Anesthesia: LOCAL

## 2015-10-26 MED ORDER — POTASSIUM CHLORIDE 10 MEQ/50ML IV SOLN
10.0000 meq | INTRAVENOUS | Status: AC | PRN
  Administered 2015-10-26 (×3): 10 meq via INTRAVENOUS

## 2015-10-26 MED ORDER — SODIUM CHLORIDE 0.9 % IR SOLN
Status: AC
  Filled 2015-10-26: qty 2

## 2015-10-26 MED ORDER — LIDOCAINE HCL (PF) 1 % IJ SOLN
INTRAMUSCULAR | Status: DC | PRN
  Administered 2015-10-26: 45 mL

## 2015-10-26 MED ORDER — SODIUM CHLORIDE 0.9 % IV SOLN: INTRAVENOUS | Status: DC

## 2015-10-26 MED ORDER — SODIUM CHLORIDE 0.9 % IV SOLN
INTRAVENOUS | Status: DC | PRN
  Administered 2015-10-26: 50 mL/h via INTRAVENOUS

## 2015-10-26 MED ORDER — MIDAZOLAM HCL 5 MG/5ML IJ SOLN
INTRAMUSCULAR | Status: DC | PRN
  Administered 2015-10-26: 1 mg via INTRAVENOUS

## 2015-10-26 MED ORDER — LIDOCAINE HCL (PF) 1 % IJ SOLN
INTRAMUSCULAR | Status: AC
  Filled 2015-10-26: qty 30

## 2015-10-26 MED ORDER — ACETAMINOPHEN 325 MG PO TABS: 325.0000 mg | ORAL_TABLET | ORAL | Status: DC | PRN

## 2015-10-26 MED ORDER — CEFAZOLIN SODIUM 1-5 GM-% IV SOLN: 1.0000 g | Freq: Four times a day (QID) | INTRAVENOUS | Status: DC

## 2015-10-26 MED ORDER — HEPARIN (PORCINE) IN NACL 2-0.9 UNIT/ML-% IJ SOLN
INTRAMUSCULAR | Status: DC | PRN
  Administered 2015-10-26: 500 mL

## 2015-10-26 MED ORDER — SODIUM CHLORIDE 0.9 % IV SOLN
250.0000 mL | INTRAVENOUS | Status: DC
  Administered 2015-10-26: 250 mL via INTRAVENOUS

## 2015-10-26 MED ORDER — MIDAZOLAM HCL 5 MG/5ML IJ SOLN
INTRAMUSCULAR | Status: AC
  Filled 2015-10-26: qty 5

## 2015-10-26 MED ORDER — ONDANSETRON HCL 4 MG/2ML IJ SOLN: 4.0000 mg | Freq: Four times a day (QID) | INTRAMUSCULAR | Status: DC | PRN

## 2015-10-26 MED ORDER — SODIUM CHLORIDE 0.9% FLUSH: 3.0000 mL | INTRAVENOUS | Status: DC | PRN

## 2015-10-26 MED ORDER — CEFAZOLIN SODIUM-DEXTROSE 2-4 GM/100ML-% IV SOLN
INTRAVENOUS | Status: AC
  Filled 2015-10-26: qty 100

## 2015-10-26 MED ORDER — SODIUM CHLORIDE 0.9% FLUSH: 3.0000 mL | Freq: Two times a day (BID) | INTRAVENOUS | Status: DC

## 2015-10-26 MED ORDER — CHLORHEXIDINE GLUCONATE 4 % EX LIQD
60.0000 mL | Freq: Once | CUTANEOUS | Status: DC
  Filled 2015-10-26: qty 60

## 2015-10-26 MED ORDER — CEFAZOLIN SODIUM-DEXTROSE 2-4 GM/100ML-% IV SOLN
2.0000 g | INTRAVENOUS | Status: AC
  Administered 2015-10-26: 2 g via INTRAVENOUS

## 2015-10-26 MED ORDER — SODIUM CHLORIDE 0.9 % IR SOLN
80.0000 mg | Status: AC
  Administered 2015-10-26: 80 mg

## 2015-10-26 MED ORDER — FENTANYL CITRATE (PF) 100 MCG/2ML IJ SOLN
INTRAMUSCULAR | Status: AC
  Filled 2015-10-26: qty 2

## 2015-10-26 MED ORDER — HEPARIN (PORCINE) IN NACL 2-0.9 UNIT/ML-% IJ SOLN
INTRAMUSCULAR | Status: AC
  Filled 2015-10-26: qty 500

## 2015-10-26 SURGICAL SUPPLY — 7 items
CABLE SURGICAL S-101-97-12 (CABLE) ×2 IMPLANT
LEAD CAPSURE NOVUS 45CM (Lead) ×2 IMPLANT
LEAD CAPSURE NOVUS 5076-52CM (Lead) ×2 IMPLANT
PAD DEFIB LIFELINK (PAD) ×2 IMPLANT
PPM ADVISA MRI DR A2DR01 (Pacemaker) ×2 IMPLANT
SHEATH CLASSIC 7F (SHEATH) ×4 IMPLANT
TRAY PACEMAKER INSERTION (PACKS) ×2 IMPLANT

## 2015-10-26 NOTE — Consult Note (Signed)
ELECTROPHYSIOLOGY CONSULT NOTE    Patient ID: Crystal Cooper MRN: [id number], DOB/AGE: 11/08/1926 82 y.o.  Admit date: 10/25/2015 Date of Consult: 10/26/2015  Primary Physician: Stephens Shire, MD Primary Cardiologist: Dr. Stanford Breed Referring MD: Dr. Julianne Handler  Reason for Consultation: bradycardia, evaluate for PPM  HPI: Crystal Cooper is a 82 y.o. female with PMHx of severe AS, AFlutter/fib, HTN, Asthma, remote SVT ablation in 2003 and glaucoma, was admitted 10/25/15 to undergo TAVR for her AS (which was done 10/25/15).  EP is being asked to evaluate for possible PPM implant.  The patient is observed underneath the temp pacing to have rates of high 30's-40's to the 60's and observed prior to her TAVR yesterday by cardiology to be in AFlutter with rates 35-40bpm.  She is not on any rate limiting meds her or at home.  Past Medical History  Diagnosis Date  . HTN (hypertension)   . Asthma   . Glaucoma   . Hearing loss   . Prolapsing mitral leaflet syndrome   . SVT (supraventricular tachycardia) (HCC)     S/P ablation of AVNRT in 2003  . Aortic stenosis   . Atrial fibrillation ([place])   . Heart murmur   . Shortness of breath dyspnea     with exertion  . CHF (congestive heart failure) ([place]) 11/2014  . Pneumonia   . Arthritis   . Anemia     years ago  . Family history of adverse reaction to anesthesia     2 daughters would have N/V     Surgical History:  Past Surgical History  Procedure Laterality Date  . Appendectomy    . Bladder surgery    . Cardiac surgery    . Nasal sinus surgery    . Cardiac catheterization N/A 09/26/2015    Procedure: Right/Left Heart Cath and Coronary Angiography;  Surgeon: Burnell Blanks, MD;  Location: [facility];  Service: Cardiovascular;  Laterality: N/A;  . Eye surgery Bilateral     cataract surgery     Prescriptions prior to admission  Medication Sig Dispense Refill Last Dose  . albuterol (PROVENTIL HFA;VENTOLIN HFA) 108 (90  BASE) MCG/ACT inhaler Inhale 2 puffs into the lungs every 4 (four) hours as needed for wheezing or shortness of breath.    10/25/2015 at 0630  . amLODipine (NORVASC) 2.5 MG tablet Take 1 tablet (2.5 mg total) by mouth daily. 30 tablet 0 10/25/2015 at 0630  . apixaban (ELIQUIS) 2.5 MG TABS tablet Take 1 tablet (2.5 mg total) by mouth 2 (two) times daily. 60 tablet 1 Past Week at Unknown time  . furosemide (LASIX) 40 MG tablet Take 1.5 tablets (60 mg total) by mouth daily. (Patient taking differently: Take 60 mg by mouth daily. Take one and one-half tablets (60mg ) daily.) 45 tablet 6 10/24/2015 at Unknown time    Inpatient Medications:  . acetaminophen  1,000 mg Oral Q6H   Or  . acetaminophen (TYLENOL) oral liquid 160 mg/5 mL  1,000 mg Per Tube Q6H  . acetaminophen (TYLENOL) oral liquid 160 mg/5 mL  650 mg Per Tube Once   Or  . acetaminophen  650 mg Rectal Once  . amLODipine  2.5 mg Oral Daily  . aspirin EC  325 mg Oral Daily   Or  . aspirin  324 mg Per Tube Daily  . cefUROXime (ZINACEF)  IV  1.5 g Intravenous Q12H  . insulin regular  0-10 Units Intravenous TID WC  . [START ON 10/27/2015] pantoprazole  40 mg Oral Daily    Allergies:  Allergies  Allergen Reactions  . Hctz [Hydrochlorothiazide] Other (See Comments)    Pt was ill and affected kidneys     Social History   Social History  . Marital Status: Widowed    Spouse Name: N/A  . Number of Children: 3  . Years of Education: N/A   Occupational History  . Not on file.   Social History Main Topics  . Smoking status: Never Smoker   . Smokeless tobacco: Never Used  . Alcohol Use: No  . Drug Use: No  . Sexual Activity: Not on file   Other Topics Concern  . Not on file   Social History Narrative     Family History  Problem Relation Age of Onset  . Pneumonia Father      Review of Systems: All other systems reviewed and are otherwise negative except as noted above.  Physical Exam: Filed Vitals:   10/26/15 0800 10/26/15  0900 10/26/15 1000 10/26/15 1100  BP: 134/62 134/63 131/60 131/61  Pulse: 50 53 50 50  Temp: 98.6 F (37 C) 98.4 F (36.9 C) 98.8 F (37.1 C) 99 F (37.2 C)  TempSrc:      Resp: 17 14 20 20   Height:      Weight:      SpO2: 98% 93% 100% 97%    GEN- The patient is well appearing, alert and oriented x 3 today.   HEENT: normocephalic, atraumatic; sclera clear, conjunctiva pink; hearing intact; oropharynx clear; neck supple, no JVP Lymph- no cervical lymphadenopathy Lungs- Clear to ausculation bilaterally, normal work of breathing.  No wheezes, rales, rhonchi Heart- Regular rate and rhythm, no murmurs, rubs or gallops, PMI not laterally displaced GI- soft, non-tender, non-distended, bowel sounds present Extremities- no clubbing, cyanosis, or edema; DP/PT/radial pulses 2+ bilaterally MS- no significant deformity or atrophy Skin- warm and dry, no rash or lesion Psych- euthymic mood, full affect Neuro- no gross deficits observed, hard of hearing  Labs:   Lab Results  Component Value Date   WBC 5.0 10/26/2015   HGB 10.3* 10/26/2015   HCT 30.7* 10/26/2015   MCV 89.2 10/26/2015   PLT 122* 10/26/2015    Recent Labs Lab 10/21/15 1532  10/26/15 0430  NA 133*  < > 132*  K 3.9  < > 3.7  CL 98*  < > 100*  CO2 23  --  24  BUN 13  < > 6  CREATININE 0.81  < > 0.75  CALCIUM 9.0  --  8.0*  PROT 7.1  --   --   BILITOT 1.2  --   --   ALKPHOS 95  --   --   ALT 19  --   --   AST 30  --   --   GLUCOSE 100*  < > 113*  < > = values in this interval not displayed.     EKG: today is AFib/flutter, 42 bpm Recent out patient EKGs with rates 40's-60's TELEMETRY: currently pacing at 50, AFib with intrinsic rates 40's-60's noted, RN and MD have observed rates in high 30's as well.  10/26/15: Echocardiogram Study Conclusions - Left ventricle: The cavity size was normal. Wall thickness was  increased in a pattern of moderate LVH. Systolic function was  normal. The estimated ejection  fraction was in the range of 60%  to 65%. - Aortic valve: Normal appearing 29 mm Sapien 3 valve with no  perivalvular regurgitation. Valve area (VTI): 1.21  cm^2. Valve  area (Vmax): 1.2 cm^2. Valve area (Vmean): 1.21 cm^2. - Mitral valve: There was mild to moderate regurgitation. - Left atrium: The atrium was mildly dilated. - Right atrium: The atrium was mildly dilated. - Atrial septum: No defect or patent foramen ovale was identified. - Tricuspid valve: There was moderate regurgitation.  09/26/15: LHC Conclusion     Prox RCA to Mid RCA lesion, 10% stenosed.  Ost LAD to Prox LAD lesion, 10% stenosed.  Prox LAD to Mid LAD lesion, 30% stenosed.  1. Mild non-obstructive CAD 2. Severe aortic valve stenosis (mean gradient 20.4 mm Hg, AVA 0.74 cm2). The valve appears to be heavily calcified     Assessment and Plan:   1. Afib/Flutter with SVR     CHA2DS2Vasc is at least 3, out patient on Eliquis     She has had for about a year or os symptoms of occassional dizziness, lightheaded spells, no near syncope or syncope, ? Secondary to AS, bradycardia, both  2.Severe AS, now s/p TAVR     POD #1.  Stable.  3. HTN     stable    Signed, Tommye Standard, PA-C 10/26/2015 12:12 PM       I have seen and examined this patient with Tommye Standard.  Agree with above, note added to reflect my findings.  On exam, regular rhythm, no murmurs, lungs clear.  Had TAVR yesterday with bradycardia into the 30s during the case.  Continued to require pacing for bradycardia.  ECG appears to be in AF with junctional escape and heart block at times.  Crystal Cooper plan for dual chamber pacemaker today as she may require cardioversion in the future.  Risks and benefits discussed.  risks include bleeding, infection, tamponade, and pneumothorax.  The patient understands the risks and has agreed.    Awesome Jared M. Dashun Borre MD 10/26/2015 2:11 PM

## 2015-10-26 NOTE — Progress Notes (Signed)
     SUBJECTIVE: NO pain or dyspnea.   Tele: atrial flutter, paced at 50 bpm. When temp pacer is turned off, her underlying Hr is 38-42 bpm.   BP 149/67 mmHg  Pulse 51  Temp(Src) 98.6 F (37 C) (Oral)  Resp 24  Ht 5\' 2"  (1.575 m)  Wt 112 lb 1.6 oz (50.848 kg)  BMI 20.50 kg/m2  SpO2 99%  Intake/Output Summary (Last 24 hours) at 10/26/15 0841 Last data filed at 10/26/15 0600  Gross per 24 hour  Intake 1798.75 ml  Output   2140 ml  Net -341.25 ml    PHYSICAL EXAM General: Well developed, well nourished, in no acute distress. Alert and oriented x 3.  Psych:  Good affect, responds appropriately Neck: No JVD. No masses noted.  Lungs: Clear bilaterally with no wheezes or rhonci noted.  Heart: RRR with no murmurs noted. Abdomen: Bowel sounds are present. Soft, non-tender.  Extremities: No lower extremity edema.   LABS: Basic Metabolic Panel:  Recent Labs  10/25/15 1518 10/26/15 0430  NA 136 132*  K 3.1* 3.7  CL 97* 100*  CO2  --  24  GLUCOSE 132* 113*  BUN 11 6  CREATININE 0.60 0.75  CALCIUM  --  8.0*  MG  --  2.5*   CBC:  Recent Labs  10/25/15 1515 10/25/15 1518 10/26/15 0430  WBC 4.1  --  5.0  HGB 10.4* 11.9* 10.3*  HCT 31.2* 35.0* 30.7*  MCV 86.2  --  89.2  PLT 136*  --  122*   Current Meds: . acetaminophen  1,000 mg Oral Q6H   Or  . acetaminophen (TYLENOL) oral liquid 160 mg/5 mL  1,000 mg Per Tube Q6H  . acetaminophen (TYLENOL) oral liquid 160 mg/5 mL  650 mg Per Tube Once   Or  . acetaminophen  650 mg Rectal Once  . amLODipine  2.5 mg Oral Daily  . aspirin EC  325 mg Oral Daily   Or  . aspirin  324 mg Per Tube Daily  . cefUROXime (ZINACEF)  IV  1.5 g Intravenous Q12H  . famotidine (PEPCID) IV  20 mg Intravenous Q12H  . insulin regular  0-10 Units Intravenous TID WC  . metoprolol tartrate  12.5 mg Oral BID   Or  . metoprolol tartrate  12.5 mg Per Tube BID  . [START ON 10/27/2015] pantoprazole  40 mg Oral Daily     ASSESSMENT AND  PLAN:  1. Severe aortic stenosis: She is stable this am POD #1 s/p TAVR. She is on ASA. Will restart Eliquis tomorrow if groin sites stable. Echo later today to assess valve.   2. Atrial flutter/bradycardia: She has received no AV nodal blocking agents. Yesterday before the TAVR, she was in atrial flutter with HR of 35-40 bpm. Today she has the same bradycardia and is requiring the temp pacer. Will ask EP team to see to discuss permanent pacemaker. She is receiving no AV nodal blocking agents.    Crystal Cooper  5/3/20178:41 AM

## 2015-10-26 NOTE — Progress Notes (Signed)
Site area: left groin fv sheath Site Prior to Removal:  Level 0 Pressure Applied For:  15 minutes Manual:   yes Patient Status During Pull:  stable Post Pull Site:  Level  0 Post Pull Instructions Given:  yes Post Pull Pulses Present: yes Dressing Applied:  Small tegaderm Bedrest begins @  1700 Comments:

## 2015-10-26 NOTE — Progress Notes (Signed)
  Echocardiogram 2D Echocardiogram has been performed.  Beck Cofer 10/26/2015, 11:18 AM

## 2015-10-26 NOTE — Progress Notes (Signed)
Patient not returning from cath lab, patient to transfer to room 2W14, I brought patients glasses to 2W14 and placed in room. Receiving RN made aware and given report.  Rowe Pavy, RN

## 2015-10-26 NOTE — Progress Notes (Signed)
1 Day Post-Op Procedure(s) (LRB): TRANSCATHETER AORTIC VALVE REPLACEMENT, TRANSFEMORAL (Right) TRANSESOPHAGEAL ECHOCARDIOGRAM (TEE) (N/A) Subjective: No complaints  Objective: Vital signs in last 24 hours: Temp:  [95.7 F (35.4 C)-98.6 F (37 C)] 98.6 F (37 C) (05/03 0600) Pulse Rate:  [30-66] 51 (05/03 0600) Cardiac Rhythm:  [-] Atrial fibrillation;Ventricular paced (05/03 0748) Resp:  [15-24] 24 (05/03 0600) BP: (110-154)/(48-71) 149/67 mmHg (05/03 0600) SpO2:  [96 %-100 %] 99 % (05/03 0600) Arterial Line BP: (121-148)/(41-59) 144/54 mmHg (05/03 0600)  Hemodynamic parameters for last 24 hours:    Intake/Output from previous day: 05/02 0701 - 05/03 0700 In: 1798.8 [P.O.:360; I.V.:1088.8; IV Piggyback:350] Out: 2140 [Urine:2040; Blood:100] Intake/Output this shift:    General appearance: alert and cooperative Neurologic: intact Heart: irregularly irregular rhythm and no murmur Lungs: clear to auscultation bilaterally Extremities: extremities normal, atraumatic, no cyanosis or edema Wound: groin sites ok  Lab Results:  Recent Labs  10/25/15 1515 10/25/15 1518 10/26/15 0430  WBC 4.1  --  5.0  HGB 10.4* 11.9* 10.3*  HCT 31.2* 35.0* 30.7*  PLT 136*  --  122*   BMET:  Recent Labs  10/25/15 1518 10/26/15 0430  NA 136 132*  K 3.1* 3.7  CL 97* 100*  CO2  --  24  GLUCOSE 132* 113*  BUN 11 6  CREATININE 0.60 0.75  CALCIUM  --  8.0*    PT/INR:  Recent Labs  10/25/15 1515  LABPROT 18.6*  INR 1.54*   ABG    Component Value Date/Time   PHART 7.404 10/25/2015 1513   HCO3 27.5* 10/25/2015 1513   TCO2 26 10/25/2015 1518   ACIDBASEDEF 0.6 12/12/2014 0423   O2SAT 99.0 10/25/2015 1513   CBG (last 3)   Recent Labs  10/25/15 2332 10/25/15 2338 10/26/15 0411  GLUCAP 152* 164* 97    Assessment/Plan: S/P Procedure(s) (LRB): TRANSCATHETER AORTIC VALVE REPLACEMENT, TRANSFEMORAL (Right) TRANSESOPHAGEAL ECHOCARDIOGRAM (TEE) (N/A)  POD  1  Hemodynamically stable  Atrial flutter with ventricular rate in the 40's-50's, sometimes down in the 30's and has been intermittently pacing with transvenous pacer on back up of 45. Will see what Dr. Angelena Form thinks about PPM.  Plan 2D echo today.  No beta blocker due to slow heart rate.  Will need to decide on anticoagulation. She was on Eliquis preop for A-fib/flutter.     LOS: 1 day    Gaye Pollack 10/26/2015

## 2015-10-27 ENCOUNTER — Inpatient Hospital Stay (HOSPITAL_COMMUNITY): Payer: Medicare Other

## 2015-10-27 ENCOUNTER — Encounter (HOSPITAL_COMMUNITY): Payer: Self-pay | Admitting: Cardiology

## 2015-10-27 LAB — CBC
HCT: 30.2 % — ABNORMAL LOW (ref 36.0–46.0)
Hemoglobin: 10 g/dL — ABNORMAL LOW (ref 12.0–15.0)
MCH: 30.1 pg (ref 26.0–34.0)
MCHC: 33.1 g/dL (ref 30.0–36.0)
MCV: 91 fL (ref 78.0–100.0)
PLATELETS: 113 10*3/uL — AB (ref 150–400)
RBC: 3.32 MIL/uL — AB (ref 3.87–5.11)
RDW: 13.9 % (ref 11.5–15.5)
WBC: 4.6 10*3/uL (ref 4.0–10.5)

## 2015-10-27 LAB — BASIC METABOLIC PANEL
ANION GAP: 7 (ref 5–15)
BUN: 7 mg/dL (ref 6–20)
CALCIUM: 8.5 mg/dL — AB (ref 8.9–10.3)
CO2: 24 mmol/L (ref 22–32)
CREATININE: 0.82 mg/dL (ref 0.44–1.00)
Chloride: 101 mmol/L (ref 101–111)
GFR calc non Af Amer: >60 mL/min (ref >60)
Glucose, Bld: 137 mg/dL — ABNORMAL HIGH (ref 65–99)
Potassium: 4.4 mmol/L (ref 3.5–5.1)
SODIUM: 132 mmol/L — AB (ref 135–145)

## 2015-10-27 LAB — GLUCOSE, CAPILLARY: Glucose-Capillary: 133 mg/dL — ABNORMAL HIGH (ref 65–99)

## 2015-10-27 MED ORDER — SODIUM CHLORIDE 0.9% FLUSH
10.0000 mL | INTRAVENOUS | Status: DC | PRN
  Administered 2015-10-27: 20 mL
  Administered 2015-10-27 (×2): 10 mL
  Filled 2015-10-27 (×3): qty 40

## 2015-10-27 NOTE — Progress Notes (Signed)
POD 2 s/p TAVR and 1 Day Post-Op Procedure(s) (LRB): Pacemaker Implant (N/A) Subjective:  No complaints  Objective: Vital signs in last 24 hours: Temp:  [97.7 F (36.5 C)-98.4 F (36.9 C)] 97.7 F (36.5 C) (05/04 1343) Pulse Rate:  [59-60] 59 (05/04 1343) Cardiac Rhythm:  [-] Ventricular paced (05/04 0850) Resp:  [18-23] 18 (05/04 1343) BP: (123-160)/(59-86) 136/59 mmHg (05/04 1343) SpO2:  [94 %-99 %] 99 % (05/04 1343) Weight:  [53.071 kg (117 lb)] 53.071 kg (117 lb) (05/04 0443)  Hemodynamic parameters for last 24 hours:    Intake/Output from previous day: 05/03 0701 - 05/04 0700 In: 480 [P.O.:480] Out: 795 [Urine:795] Intake/Output this shift: Total I/O In: 600 [P.O.:600] Out: 1200 [Urine:1200]  General appearance: alert and cooperative Neurologic: intact Heart: irregularly irregular rhythm Lungs: clear to auscultation bilaterally Extremities: extremities normal, atraumatic, no cyanosis or edema Wound: groin sites ok Pressure dressing over pacer site per EP, not removed  Lab Results:  Recent Labs  10/26/15 0430 10/27/15 0505  WBC 5.0 4.6  HGB 10.3* 10.0*  HCT 30.7* 30.2*  PLT 122* 113*   BMET:  Recent Labs  10/26/15 0430 10/27/15 0815  NA 132* 132*  K 3.7 4.4  CL 100* 101  CO2 24 24  GLUCOSE 113* 137*  BUN 6 7  CREATININE 0.75 0.82  CALCIUM 8.0* 8.5*    PT/INR:  Recent Labs  10/25/15 1515  LABPROT 18.6*  INR 1.54*   ABG    Component Value Date/Time   PHART 7.404 10/25/2015 1513   HCO3 27.5* 10/25/2015 1513   TCO2 26 10/25/2015 1518   ACIDBASEDEF 0.6 12/12/2014 0423   O2SAT 99.0 10/25/2015 1513   CBG (last 3)   Recent Labs  10/26/15 1226 10/26/15 1606 10/27/15 [address] [place], [place] [address]   [phone]  ------------------------------------------------------------------- Transthoracic Echocardiography  Patient: Crystal Cooper, Crystal Cooper MR #: [id number] Study Date: 10/26/2015 Gender: F Age: 82 Height: 157.5 cm Weight: 50.8 kg BSA: 1.49 m^2 Pt. Status: Room: 2S08C  SONOGRAPHER Diamond Nickel ADMITTING Lauree Chandler ATTENDING Sonny Dandy, Christopher REFERRING McAlhany, Christopher PERFORMING [facility], Inpatient  cc:  ------------------------------------------------------------------- LV EF: 60% - 65%  ------------------------------------------------------------------- Indications: Aortic stenosis 424.1.  ------------------------------------------------------------------- History: PMH: Asthma. Prolapsing mitral leaflet syndrome. Supraventricular tachycardia. Post TAVR. Dyspnea and murmur. Atrial fibrillation. Congestive heart failure. Risk factors: Hypertension.  ------------------------------------------------------------------- Study Conclusions  - Left ventricle: The cavity size was normal. Wall thickness was  increased in a pattern of moderate LVH. Systolic function was  normal. The estimated ejection fraction was in the range of 60%  to 65%. - Aortic valve: Normal appearing 29 mm Sapien 3 valve with no  perivalvular regurgitation. Valve area (VTI): 1.21 cm^2. Valve  area (Vmax): 1.2 cm^2. Valve area (Vmean): 1.21 cm^2. - Mitral valve: There was mild to moderate regurgitation. - Left atrium: The atrium was mildly dilated. - Right atrium: The atrium was mildly dilated. - Atrial septum: No defect or patent foramen ovale was identified. - Tricuspid valve: There was moderate regurgitation.  Transthoracic echocardiography. M-mode, complete 2D, spectral Doppler, and color Doppler. Birthdate: Patient birthdate: Sep 24, 1926. Age: Patient  is 82 yr old. Sex: Gender: female. BMI: 20.5 kg/m^2. Blood pressure: 149/67 Patient status: Inpatient. Study date: Study date: 10/26/2015. Study time: 10:42 AM. Location: ICU/CCU  -------------------------------------------------------------------  ------------------------------------------------------------------- Left ventricle: The cavity size was normal. Wall thickness was increased in a pattern of  moderate LVH. Systolic function was normal. The estimated ejection fraction was in the range of 60% to 65%.  ------------------------------------------------------------------- Aortic valve: Normal appearing 29 mm Sapien 3 valve with no perivalvular regurgitation. Doppler: VTI ratio of LVOT to aortic valve: 0.39. Valve area (VTI): 1.21 cm^2. Indexed valve area (VTI): 0.81 cm^2/m^2. Peak velocity ratio of LVOT to aortic valve: 0.38. Valve area (Vmax): 1.2 cm^2. Indexed valve area (Vmax): 0.8 cm^2/m^2. Mean velocity ratio of LVOT to aortic valve: 0.38. Valve area (Vmean): 1.21 cm^2. Indexed valve area (Vmean): 0.81 cm^2/m^2.  Mean gradient (S): 6 mm Hg. Peak gradient (S): 11 mm Hg.  ------------------------------------------------------------------- Aorta: The aorta was normal, not dilated, and non-diseased.  ------------------------------------------------------------------- Mitral valve: Doppler: There was mild to moderate regurgitation.  ------------------------------------------------------------------- Left atrium: The atrium was mildly dilated.  ------------------------------------------------------------------- Atrial septum: No defect or patent foramen ovale was identified.  ------------------------------------------------------------------- Right ventricle: The cavity size was normal. Wall thickness was normal. Systolic function was normal.  ------------------------------------------------------------------- Pulmonic valve:  Doppler: There was mild regurgitation.  ------------------------------------------------------------------- Tricuspid valve: Doppler: There was moderate regurgitation.  ------------------------------------------------------------------- Right atrium: The atrium was mildly dilated.  ------------------------------------------------------------------- Pericardium: The pericardium was normal in appearance.  ------------------------------------------------------------------- Post procedure conclusions Ascending Aorta:  - The aorta was normal, not dilated, and non-diseased.  ------------------------------------------------------------------- Measurements  Left ventricle Value Reference LV ID, ED, PLAX chordal (L) 32.1 mm 43 - 52 LV ID, ES, PLAX chordal (L) 19.8 mm 23 - 38 LV fx shortening, PLAX chordal 38 % >=29 LV PW thickness, ED 12.2 mm --------- IVS/LV PW ratio, ED 1.16 <=1.3 Stroke volume, 2D 45 ml --------- Stroke volume/bsa, 2D 30 ml/m^2 --------- LV e&', lateral 8.05 cm/s --------- LV e&', medial 6.09 cm/s --------- LV e&', average 7.07 cm/s ---------  Ventricular septum Value Reference IVS thickness, ED 14.1 mm ---------  LVOT Value Reference LVOT ID, S 20 mm --------- LVOT area 3.14 cm^2 --------- LVOT peak velocity, S 62.8 cm/s --------- LVOT mean velocity, S 41.5 cm/s --------- LVOT VTI,  S 14.4 cm --------- LVOT peak gradient, S 2 mm Hg ---------  Aortic valve Value Reference Aortic valve peak velocity, S 164 cm/s --------- Aortic valve mean velocity, S 108 cm/s --------- Aortic valve VTI, S 37.4 cm --------- Aortic mean gradient, S 6 mm Hg --------- Aortic peak gradient, S 11 mm Hg --------- VTI ratio, LVOT/AV 0.39 --------- Aortic valve area, VTI 1.21 cm^2 --------- Aortic valve area/bsa, VTI 0.81 cm^2/m^2 --------- Velocity ratio, peak, LVOT/AV 0.38 --------- Aortic valve area, peak velocity 1.2 cm^2 --------- Aortic valve area/bsa, peak 0.8 cm^2/m^2 --------- velocity Velocity ratio, mean, LVOT/AV 0.38 --------- Aortic valve area, mean velocity 1.21 cm^2 --------- Aortic valve area/bsa, mean 0.81 cm^2/m^2 --------- velocity  Left atrium Value Reference LA ID, A-P, ES 39 mm --------- LA ID/bsa, A-P (H) 2.61 cm/m^2 <=2.2 LA volume, S 71.3 ml --------- LA volume/bsa, S 47.8 ml/m^2 --------- LA volume, ES, 1-p A4C 56 ml --------- LA volume/bsa, ES, 1-p A4C 37.5 ml/m^2 --------- LA volume, ES, 1-p A2C 77.7 ml --------- LA volume/bsa, ES, 1-p A2C 52.1 ml/m^2 ---------  Tricuspid valve Value Reference Tricuspid regurg peak velocity 327 cm/s  --------- Tricuspid peak RV-RA gradient 43 mm Hg ---------  Legend: (L) and (H) mark values outside specified reference range.  ------------------------------------------------------------------- Prepared and Electronically Authenticated by  Jenkins Rouge, M.D. 2017-05-03T12:15:26   Assessment/Plan:  POD 2 s/p TAVR. Echo personally reviewed by me and valve is functioning normally with low gradient and no paravalvular leak seen. She ambulated once today  with cardiac rehab and reportedly weak. I am not sure why she only walked once today and it is 5:00 pm. Her daughter is here with her and also concerned that she only walked once. She has been hemodynamically stable. Pacer checked this am by EP and functioning normally. She will be able to go home tomorrow if walking ok and pacer site ok. I would wait to restart Eliquis with pacer pocket hematoma.  LOS: 2 days    Gaye Pollack 10/27/2015

## 2015-10-27 NOTE — Progress Notes (Signed)
SUBJECTIVE: The patient is doing well today.  At this time, she denies chest pain, shortness of breath, or any new concerns.  She has minimal discomfort at her pacer site.  [name] acetaminophen  1,000 mg Oral Q6H   Or  . acetaminophen (TYLENOL) oral liquid 160 mg/5 mL  1,000 mg Per Tube Q6H  . acetaminophen (TYLENOL) oral liquid 160 mg/5 mL  650 mg Per Tube Once   Or  . acetaminophen  650 mg Rectal Once  . amLODipine  2.5 mg Oral Daily  . aspirin EC  325 mg Oral Daily   Or  . aspirin  324 mg Per Tube Daily  . pantoprazole  40 mg Oral Daily   . nitroGLYCERIN    . phenylephrine (NEO-SYNEPHRINE) Adult infusion      OBJECTIVE: Physical Exam: Filed Vitals:   10/26/15 1715 10/26/15 1721 10/26/15 2000 10/27/15 0443  BP: 123/86 151/70 148/60 159/74  Pulse: 59 59 60 60  Temp: 98.3 F (36.8 C)  98.4 F (36.9 C) 97.8 F (36.6 C)  TempSrc: Oral  Oral Oral  Resp: 18  18 18   Height:      Weight:    117 lb (53.071 kg)  SpO2: 95%  94% 98%    Intake/Output Summary (Last 24 hours) at 10/27/15 0953 Last data filed at 10/27/15 0827  Gross per 24 hour  Intake    480 ml  Output   1245 ml  Net   -765 ml    Telemetry reveals Afib, generally V paced, at times rates 60's-70's  GEN- The patient is well appearing, alert and oriented x 3 today.   Head- normocephalic, atraumatic Eyes-  Sclera clear, conjunctiva pink Ears- hearing intact Oropharynx- clear Neck- supple, no JVP Lungs- Clear to ausculation bilaterally, normal work of breathing Heart- Regular rate and rhythm (paced), no significant murmurs, no rubs or gallops GI- soft, NT, ND Extremities- no clubbing, cyanosis, or edema Skin- no rash or lesion Psych- euthymic mood, full affect Neuro- no gross deficits appreciated  PPM site has hematoma (golf ball size), minimal ecchymosis, dressing is dry  LABS: Basic Metabolic Panel:  Recent Labs  10/26/15 0430 10/27/15 0815  NA 132* 132*  K 3.7 4.4  CL 100* 101  CO2 24 24    GLUCOSE 113* 137*  BUN 6 7  CREATININE 0.75 0.82  CALCIUM 8.0* 8.5*  MG 2.5*  --    CBC:  Recent Labs  10/26/15 0430 10/27/15 0505  WBC 5.0 4.6  HGB 10.3* 10.0*  HCT 30.7* 30.2*  MCV 89.2 91.0  PLT 122* 113*   RADIOLOGY: Dg Chest 2 View 10/27/2015  CLINICAL DATA:  Weakness.  Status post pacer placement EXAM: CHEST  2 VIEW COMPARISON:  10/26/2015 FINDINGS: There is a left chest wall pacer device with lead in the right atrial appendage and right ventricle. No pneumothorax identified. Right IJ catheter tip is in the projection of the SVC. Moderate cardiac enlargement. Aortic atherosclerosis noted. The lungs appear hyperinflated but are clear. Improvement in pulmonary edema pattern. IMPRESSION: 1. Improvement in pulmonary edema. 2. No complication after left chest wall pacer placement. Electronically Signed   By: Kerby Moors M.D.   On: 10/27/2015 08:17     ASSESSMENT AND PLAN:   1. Afib/Flutter with SVR  CHA2DS2Vasc is at least 3, out patient on Eliquis  Symptomatic bradycardia     S/p PPM implant yesterday by Dr. Curt Bears     Device interrogation this morning shows normal function  cxr is w/o pneumothorax Hematoma at pacer site, pressure was held to the site with significant improvement, patient tolerated well, pressure dressing was applied.  2.Severe AS, now s/p TAVR  POD #2. Stable.  3. HTN  stable  Tommye Standard, PA-C 10/27/2015 9:53 AM   I have seen and examined this patient with Tommye Standard.  Agree with above, note added to reflect my findings.  On exam, regular rhythm, no murmurs, lungs clear.  Had dual chamber pacemaker placed yesterday.  Tolerated procedure well.  Device interrogation this AM shows working appropriately.  Has hematoma over site.  Plan to watch overnight and discharge tomorrow if stable.    Johnluke Haugen M. Reagann Dolce MD 10/27/2015 8:41 PM

## 2015-10-27 NOTE — Progress Notes (Signed)
CARDIAC REHAB PHASE I   PRE:  Rate/Rhythm: 60 paced  BP:  Supine:   Sitting: 157/67  Standing:    SaO2: 92%RA  MODE:  Ambulation: 80 ft   POST:  Rate/Rhythm: 77  BP:  Supine:   Sitting: 174/76  Standing:    SaO2: 97%RA 1140-1205 Pt walked 80 ft with gait belt use, her cane and asst x 1 with unsteady gait. Encouraged pt to stand upright. Would recommend PT consult and discussed with RN. Pt is very weak and would benefit from their services too. To recliner after walk. Set up lunch and got coffee for pt. Family in room.   Graylon Good, RN BSN  10/27/2015 12:13 PM

## 2015-10-27 NOTE — Progress Notes (Signed)
SUBJECTIVE: Sore over pacemaker site. No chest pain or dyspnea  Tele: paced at 60 bpm  BP 159/74 mmHg  Pulse 60  Temp(Src) 97.8 F (36.6 C) (Oral)  Resp 18  Ht 5\' 2"  (1.575 m)  Wt 117 lb (53.071 kg)  BMI 21.39 kg/m2  SpO2 98%  Intake/Output Summary (Last 24 hours) at 10/27/15 0738 Last data filed at 10/26/15 2020  Gross per 24 hour  Intake    480 ml  Output    795 ml  Net   -315 ml    PHYSICAL EXAM General: Well developed, well nourished, in no acute distress. Alert and oriented x 3.  Psych:  Good affect, responds appropriately Neck: No JVD. No masses noted.  Lungs: Clear bilaterally with no wheezes or rhonci noted.  Heart: RRR with no murmurs noted. Abdomen: Bowel sounds are present. Soft, non-tender.  Extremities: No lower extremity edema.  Chest wall: hematoma at pacer site.   LABS: Basic Metabolic Panel:  Recent Labs  10/25/15 1518 10/26/15 0430  NA 136 132*  K 3.1* 3.7  CL 97* 100*  CO2  --  24  GLUCOSE 132* 113*  BUN 11 6  CREATININE 0.60 0.75  CALCIUM  --  8.0*  MG  --  2.5*   CBC:  Recent Labs  10/26/15 0430 10/27/15 0505  WBC 5.0 4.6  HGB 10.3* 10.0*  HCT 30.7* 30.2*  MCV 89.2 91.0  PLT 122* PENDING   Current Meds: . acetaminophen  1,000 mg Oral Q6H   Or  . acetaminophen (TYLENOL) oral liquid 160 mg/5 mL  1,000 mg Per Tube Q6H  . acetaminophen (TYLENOL) oral liquid 160 mg/5 mL  650 mg Per Tube Once   Or  . acetaminophen  650 mg Rectal Once  . amLODipine  2.5 mg Oral Daily  . aspirin EC  325 mg Oral Daily   Or  . aspirin  324 mg Per Tube Daily  . cefUROXime (ZINACEF)  IV  1.5 g Intravenous Q12H  . insulin regular  0-10 Units Intravenous TID WC  . pantoprazole  40 mg Oral Daily   Echo 10/26/15: - Left ventricle: The cavity size was normal. Wall thickness was  increased in a pattern of moderate LVH. Systolic function was  normal. The estimated ejection fraction was in the range of 60%  to 65%. - Aortic valve: Normal  appearing 29 mm Sapien 3 valve with no  perivalvular regurgitation. Valve area (VTI): 1.21 cm^2. Valve  area (Vmax): 1.2 cm^2. Valve area (Vmean): 1.21 cm^2. - Mitral valve: There was mild to moderate regurgitation. - Left atrium: The atrium was mildly dilated. - Right atrium: The atrium was mildly dilated. - Atrial septum: No defect or patent foramen ovale was identified. - Tricuspid valve: There was moderate regurgitation.   ASSESSMENT AND PLAN:   1. Severe aortic stenosis: She is stable this am POD #2 s/p TAVR. She is on ASA. Will not restart Eliquis today due to hematoma at pacemaker site left chest wall. Echo yesterday with normally functioning AVR.    2. Atrial flutter/bradycardia: Pt had baseline atrial flutter with bradycardia. Post procedure no change in baseline HR 35-40 bpm. Permanent pacemaker implanted yesterday. She has a hematoma at the pacemaker site. Will monitor today and plan d/c home tomorrow if pacemaker site is stable. Will not restart Eliquis until ok with EP team.   Probable d/c home tomorrow. I have spoken to her daughters this am.   Elmira Olkowski  5/4/20177:38 AM

## 2015-10-27 NOTE — Discharge Instructions (Signed)
° ° °  Supplemental Discharge Instructions for  Pacemaker/Defibrillator Patients  Activity No heavy lifting or vigorous activity with your left/right arm for 6 to 8 weeks.  Do not raise your left/right arm above your head for one week.  Gradually raise your affected arm as drawn below.             10/30/15                      10/31/15                       11/01/15                     11/02/15 __  NO DRIVING for  1 week    ; you may begin driving on  [id number]    .  WOUND CARE - Keep the wound area clean and dry.  Do not get this area wet for one week. No showers for one week; you may shower on 11/02/15  . - The tape/steri-strips on your wound will fall off; do not pull them off.  No bandage is needed on the site.  DO  NOT apply any creams, oils, or ointments to the wound area. - If you notice any drainage or discharge from the wound, any swelling or bruising at the site, or you develop a fever > 101? F after you are discharged home, call the office at once.  Special Instructions - You are still able to use cellular telephones; use the ear opposite the side where you have your pacemaker/defibrillator.  Avoid carrying your cellular phone near your device. - When traveling through airports, show security personnel your identification card to avoid being screened in the metal detectors.  Ask the security personnel to use the hand wand. - Avoid arc welding equipment, MRI testing (magnetic resonance imaging), TENS units (transcutaneous nerve stimulators).  Call the office for questions about other devices. - Avoid electrical appliances that are in poor condition or are not properly grounded. - Microwave ovens are safe to be near or to operate.  Additional information for defibrillator patients should your device go off: - If your device goes off ONCE and you feel fine afterward, notify the device clinic nurses. - If your device goes off ONCE and you do not feel well afterward, call 911. - If your device  goes off TWICE, call 911. - If your device goes off THREE times in one day, call 911.  DO NOT DRIVE YOURSELF OR A FAMILY MEMBER WITH A DEFIBRILLATOR TO THE HOSPITAL--CALL 911.

## 2015-10-28 ENCOUNTER — Telehealth: Payer: Self-pay | Admitting: Cardiovascular Disease

## 2015-10-28 DIAGNOSIS — D500 Iron deficiency anemia secondary to blood loss (chronic): Secondary | ICD-10-CM

## 2015-10-28 DIAGNOSIS — R5381 Other malaise: Secondary | ICD-10-CM

## 2015-10-28 LAB — BASIC METABOLIC PANEL
ANION GAP: 7 (ref 5–15)
BUN: 8 mg/dL (ref 6–20)
CALCIUM: 8.5 mg/dL — AB (ref 8.9–10.3)
CO2: 26 mmol/L (ref 22–32)
CREATININE: 0.79 mg/dL (ref 0.44–1.00)
Chloride: 101 mmol/L (ref 101–111)
GLUCOSE: 94 mg/dL (ref 65–99)
Potassium: 4.2 mmol/L (ref 3.5–5.1)
Sodium: 134 mmol/L — ABNORMAL LOW (ref 135–145)

## 2015-10-28 LAB — CBC
HEMATOCRIT: 26.9 % — AB (ref 36.0–46.0)
Hemoglobin: 9.3 g/dL — ABNORMAL LOW (ref 12.0–15.0)
MCH: 31.1 pg (ref 26.0–34.0)
MCHC: 34.6 g/dL (ref 30.0–36.0)
MCV: 90 fL (ref 78.0–100.0)
PLATELETS: 113 10*3/uL — AB (ref 150–400)
RBC: 2.99 MIL/uL — ABNORMAL LOW (ref 3.87–5.11)
RDW: 13.9 % (ref 11.5–15.5)
WBC: 5.6 10*3/uL (ref 4.0–10.5)

## 2015-10-28 MED ORDER — ASPIRIN 325 MG PO TBEC: 325.0000 mg | DELAYED_RELEASE_TABLET | Freq: Every day | ORAL | Status: DC

## 2015-10-28 MED FILL — Insulin Regular (Human) Inj 100 Unit/ML: INTRAMUSCULAR | Qty: 250 | Status: AC

## 2015-10-28 NOTE — Evaluation (Signed)
Physical Therapy Evaluation Patient Details Name: Crystal Cooper MRN: [id number] DOB: 04/20/1927 Today's Date: 10/28/2015   History of Present Illness  82 yo female with onset of TAVR after extended time with low energy and now has TAVR, pacemaker site with hematoma.  PMHx:  aortic stenosis, a-flutter, bradycardia, pacemaker implanted 5.3,   Clinical Impression  Pt had 150' walk for 6 min test, speed with 5 meter walk was .9 to 1.1 feet per second, had O2 sat 95% after longer walk and pulses were controlled at 76 post gait.     Follow Up Recommendations CIR    Equipment Recommendations  Cane    Recommendations for Other Services Rehab consult     Precautions / Restrictions Precautions Precautions: ICD/Pacemaker;Fall Restrictions Weight Bearing Restrictions: No      Mobility  Bed Mobility               General bed mobility comments: up in chair when PT entered  Transfers Overall transfer level: Needs assistance Equipment used: 1 person hand held assist;Straight cane Transfers: Sit to/from Stand;Stand Pivot Transfers Sit to Stand: Min guard;Min assist Stand pivot transfers: Min guard;Min assist       General transfer comment: Pt is forgetting to use RUE and tends to try to get LUE out of sling  Ambulation/Gait Ambulation/Gait assistance: Min guard;Min assist Ambulation Distance (Feet): 150 Feet Assistive device: Straight cane;1 person hand held assist Gait Pattern/deviations: Step-through pattern;Shuffle;Narrow base of support;Trunk flexed;Drifts right/left Gait velocity: reduced Gait velocity interpretation: Below normal speed for age/gender General Gait Details: Pt is SOB with the effort and O2 sats were 95% post gait  Stairs Stairs:  (Pt was SOB with hallway walks)          Wheelchair Mobility    Modified Rankin (Stroke Patients Only)       Balance Overall balance assessment: Needs assistance Sitting-balance support: Feet supported Sitting  balance-Leahy Scale: Good   Postural control: Posterior lean Standing balance support: Single extremity supported Standing balance-Leahy Scale: Fair Standing balance comment: fair- dynamic standing balance                 Standardized Balance Assessment Standardized Balance Assessment : TUG: Timed Up and Go Test     Timed Up and Go Test TUG: Normal TUG Normal TUG (seconds): 20.3     Pertinent Vitals/Pain Pain Assessment: 0-10 Pain Score: 5  Pain Location: surgery site Pain Descriptors / Indicators: Operative site guarding Pain Intervention(s): Monitored during session;Premedicated before session;Repositioned (applied her sling)    Home Living Family/patient expects to be discharged to:: Private residence Living Arrangements: Alone Available Help at Discharge: Family;Available PRN/intermittently Type of Home: House Home Access: Stairs to enter Entrance Stairs-Rails: Left Entrance Stairs-Number of Steps: 3 Home Layout: One level Home Equipment: Walker - 2 wheels;Cane - single point      Prior Function Level of Independence: Independent with assistive device(s)               Hand Dominance   Dominant Hand: Right    Extremity/Trunk Assessment   Upper Extremity Assessment: Generalized weakness;LUE deficits/detail       LUE Deficits / Details: L arm in a sling   Lower Extremity Assessment: Generalized weakness      Cervical / Trunk Assessment: Kyphotic  Communication   Communication: HOH  Cognition Arousal/Alertness: Awake/alert Behavior During Therapy: WFL for tasks assessed/performed Overall Cognitive Status: Within Functional Limits for tasks assessed  General Comments General comments (skin integrity, edema, etc.): Pt is fatigued with effort of gait and needed some rest between walks to assess for TAVR, but has modified BORG of 5 and SOB with sats normal    Exercises        Assessment/Plan    PT  Assessment Patient needs continued PT services  PT Diagnosis Difficulty walking;Acute pain   PT Problem List Decreased strength;Decreased range of motion;Decreased activity tolerance;Decreased balance;Decreased mobility;Decreased coordination;Decreased knowledge of use of DME;Decreased safety awareness;Decreased knowledge of precautions;Cardiopulmonary status limiting activity;Decreased skin integrity;Pain  PT Treatment Interventions DME instruction;Gait training;Stair training;Functional mobility training;Therapeutic activities;Therapeutic exercise;Balance training;Neuromuscular re-education;Patient/family education   PT Goals (Current goals can be found in the Care Plan section) Acute Rehab PT Goals Patient Stated Goal: to get home when able PT Goal Formulation: With patient Time For Goal Achievement: 11/11/15 Potential to Achieve Goals: Good    Frequency Min 4X/week   Barriers to discharge Decreased caregiver support;Other (comment) (has drop in help only)      Co-evaluation               End of Session Equipment Utilized During Treatment: Gait belt Activity Tolerance: Patient tolerated treatment well Patient left: in chair;with call bell/phone within reach;with nursing/sitter in room Nurse Communication: Mobility status    Functional Assessment Tool Used: 6 minute walk test 150' with SPC and min guard assist, time for 5 meter walks .9 to 1.1 f/sec, frailty scale was 6.     Time: [id number] PT Time Calculation (min) (ACUTE ONLY): 33 min   Charges:   PT Evaluation $PT Eval Moderate Complexity: 1 Procedure PT Treatments $Gait Training: 8-22 mins   PT G Codes:   PT G-Codes **NOT FOR INPATIENT CLASS** Functional Assessment Tool Used: 6 minute walk test 150' with SPC and min guard assist, time for 5 meter walks .9 to 1.1 f/sec, frailty scale was 6.     Ramond Dial 10/28/2015, 10:29 AM   Mee Hives, PT MS Acute Rehab Dept. Number: [phone] [id number] and [place] [phone]

## 2015-10-28 NOTE — Progress Notes (Signed)
Pt ambulated 150 feet on RA with cane and assist x 1.  During walk pt tired easily and felt weak.  Pt assisted to recliner after walk.  Pt now resting comfortably with call bell within reach.  RN will continue to monitor.  Claudette Stapler, RN

## 2015-10-28 NOTE — Progress Notes (Signed)
SUBJECTIVE: The patient is doing well today.  At this time, she denies chest pain, shortness of breath, or any new concerns.  She has minimal discomfort at her pacer site.  [name] acetaminophen  1,000 mg Oral Q6H   Or  . acetaminophen (TYLENOL) oral liquid 160 mg/5 mL  1,000 mg Per Tube Q6H  . acetaminophen (TYLENOL) oral liquid 160 mg/5 mL  650 mg Per Tube Once   Or  . acetaminophen  650 mg Rectal Once  . amLODipine  2.5 mg Oral Daily  . aspirin EC  325 mg Oral Daily   Or  . aspirin  324 mg Per Tube Daily  . pantoprazole  40 mg Oral Daily   . nitroGLYCERIN    . phenylephrine (NEO-SYNEPHRINE) Adult infusion      OBJECTIVE: Physical Exam: Filed Vitals:   10/27/15 1343 10/27/15 1946 10/28/15 0611 10/28/15 0638  BP: 136/59 149/63 172/67 158/68  Pulse: 59 61 61   Temp: 97.7 F (36.5 C) 98 F (36.7 C) 98.1 F (36.7 C)   TempSrc: Oral Axillary Axillary   Resp: 18 20 20    Height:      Weight:   117 lb (53.071 kg)   SpO2: 99% 100% 99%     Intake/Output Summary (Last 24 hours) at 10/28/15 0807 Last data filed at 10/27/15 1800  Gross per 24 hour  Intake    480 ml  Output   1200 ml  Net   -720 ml    Telemetry reveals Afib, generally V paced, at times rates 60's-70's  GEN- The patient is well appearing, alert and oriented x 3 today.   Head- normocephalic, atraumatic Eyes-  Sclera clear, conjunctiva pink Ears- hearing intact Oropharynx- clear Neck- supple, no JVP Lungs- Clear to ausculation bilaterally, normal work of breathing Heart- Regular rate and rhythm (paced), no significant murmurs, no rubs or gallops GI- soft, NT, ND Extremities- no clubbing, cyanosis, or edema Skin- no rash or lesion Psych- euthymic mood, full affect Neuro- no gross deficits appreciated, hard of hearing  PPM site has hematoma remains, similar to yesterday, + ecchymosis, dressing is dry, no bleeding, mild tenderness  LABS: Basic Metabolic Panel:  Recent Labs  10/26/15 0430  10/27/15 0815 10/28/15 0625  NA 132* 132* 134*  K 3.7 4.4 4.2  CL 100* 101 101  CO2 24 24 26   GLUCOSE 113* 137* 94  BUN 6 7 8   CREATININE 0.75 0.82 0.79  CALCIUM 8.0* 8.5* 8.5*  MG 2.5*  --   --    CBC:  Recent Labs  10/27/15 0505 10/28/15 0627  WBC 4.6 5.6  HGB 10.0* 9.3*  HCT 30.2* 26.9*  MCV 91.0 90.0  PLT 113* 113*   RADIOLOGY: Dg Chest 2 View 10/27/2015  CLINICAL DATA:  Weakness.  Status post pacer placement EXAM: CHEST  2 VIEW COMPARISON:  10/26/2015 FINDINGS: There is a left chest wall pacer device with lead in the right atrial appendage and right ventricle. No pneumothorax identified. Right IJ catheter tip is in the projection of the SVC. Moderate cardiac enlargement. Aortic atherosclerosis noted. The lungs appear hyperinflated but are clear. Improvement in pulmonary edema pattern. IMPRESSION: 1. Improvement in pulmonary edema. 2. No complication after left chest wall pacer placement. Electronically Signed   By: Kerby Moors M.D.   On: 10/27/2015 08:17     ASSESSMENT AND PLAN:   1. Afib/Flutter with SVR  CHA2DS2Vasc is at least 3, out patient on Eliquis  Symptomatic bradycardia  S/p PPM implant 10/26/15 by Dr. Curt Bears     Device interrogation 10/27/15 post implant showed normal function     cxr is w/o pneumothorax 10/27/15 Hematoma remains at pacer site despite pressure dressing, agree with holding her Eliquis until next week as discussed with Dr Julianne Handler, Pura Picinich have wound check Monday next week to re-evaluate site. Patient is instructed to call if any worsening or increase in size.  2.Severe AS, now s/p TAVR  POD #3. Stable.  3. HTN  stable  Tommye Standard, PA-C 10/28/2015 8:07 AM    I have seen and examined this patient with Tommye Standard on 10/28/15.  Agree with above, note added to reflect my findings.  On exam, regular rhythm, no murmurs, lungs clear.  Had dual chamber pacemaker placed. Hematoma over device site.  Serria Sloma plan on follow up in device  clinic on Monday for wound check.  Agree with holding anticoagulation until hematoma evaluated.    Krystyn Picking M. Jniya Madara MD 10/29/2015 7:42 AM  oagu

## 2015-10-28 NOTE — Discharge Summary (Signed)
Discharge Summary    Patient ID: Crystal Cooper,  MRN: [id number], DOB/AGE: [birth year] 82 y.o.  Admit date: 10/25/2015 Discharge date: 10/28/2015  Primary Care Provider: BURNETT,BRENT Cooper Primary Cardiologist: Dr. Stanford Cooper  Discharge Diagnoses    Active Problems:   Severe aortic stenosis   Junctional bradycardia   Allergies Allergies  Allergen Reactions  . Hctz [Hydrochlorothiazide] Other (See Comments)    Pt was ill and affected kidneys     Diagnostic Studies/Procedures  Transesophageal Echocardiography - Left ventricle: The cavity size was normal. Wall thickness was  normal. Systolic function was normal. The estimated ejection  fraction was in the range of 60% to 65%. Wall motion was normal;  there were no regional wall motion abnormalities. - Aortic valve: Bicuspid; severely thickened, severely calcified  leaflets. Cusp separation was severely reduced. There was severe  stenosis. There was mild regurgitation. - Mitral valve: There was mild regurgitation. - Left atrium: No evidence of thrombus in the atrial cavity or  appendage. No evidence of thrombus in the atrial cavity or  appendage. - Right atrium: No evidence of thrombus in the atrial cavity or  appendage. - Atrial septum: No defect or patent foramen ovale was identified. - Tricuspid valve: There was moderate regurgitation.  Impressions:  - This was an intraoperative TEE during Cooper TAVR procedure.  Transthoracic Echocardiography Post TAVR 10/26/15 - Left ventricle: The cavity size was normal. Wall thickness was  increased in Cooper pattern of moderate LVH. Systolic function was  normal. The estimated ejection fraction was in the range of 60%  to 65%. - Aortic valve: Normal appearing 29 mm Sapien 3 valve with no  perivalvular regurgitation. Valve area (VTI): 1.21 cm^2. Valve  area (Vmax): 1.2 cm^2. Valve area (Vmean): 1.21 cm^2. - Mitral valve: There was mild to moderate regurgitation. - Left  atrium: The atrium was mildly dilated. - Right atrium: The atrium was mildly dilated. - Atrial septum: No defect or patent foramen ovale was identified. - Tricuspid valve: There was moderate regurgitation.  Pacemaker Implant 10/26/15    DESCRIPTION OF PROCEDURE: Informed written consent was obtained, and  the patient was brought to the electrophysiology lab in Cooper fasting state. The patient required no sedation for the procedure today. The patients left chest was prepped and draped in the usual sterile fashion by the EP lab staff. The skin overlying the left deltopectoral region was infiltrated with lidocaine for local analgesia. Cooper 4-cm incision was made over the left deltopectoral region. Cooper left subcutaneous pacemaker pocket was fashioned using Cooper combination of sharp and blunt dissection. Electrocautery was required to assure hemostasis.   RA/RV Lead Placement: The left cephalic vein was therefore directly visualized and cannulated. Through the left cephalic vein, Cooper Medtronic model 5076 (serial number [id number]) right atrial lead and Cooper Medtronic model 5076 (serial number [id number]) right ventricular lead were advanced  with fluoroscopic visualization into the right atrial appendage and right ventricular apex positions respectively. Initial atrial lead fib- waves measured 4.62mV with impedance of 623 ohms. Right ventricular lead R-waves measured 7.1 mV with an impedance of 667 ohms and Cooper threshold of 1.1 V at 0.5 msec. Both leads were secured to the pectoralis fascia using #2-0 silk over the suture sleeves.   Device Placement: The leads were then connected to Cooper Medtronic Advisa DR MRI model A2DR01 1 (serial number [id number]) pacemaker. The pocket was irrigated with copious gentamicin solution. The pacemaker was then placed into the pocket. The pocket was  then closed in 3 layers with 2.0 Vicryl suture for the subcutaneous and 3.0 Vicryl suture subcuticular layers. Steri-Strips  and Cooper sterile dressing were then applied. EBL<10ml. There were no early apparent complications.    CONCLUSIONS:  1. Successful implantation of Cooper Medtronic Advisa DR MRI dual-chamber pacemaker for atrial fibrillation and junctional bradycardia 2. No early apparent complications.    _____________   History of Present Illness   Crystal Cooper is Cooper 82 year old female with past medical history of hypertension, persistent atrial fibrillation, chronic diastolic heart failure, and severe aortic stenosis. She had progressive dyspnea over the past 6 months, echo on 08/12/2015 showed normal LV function with severe aortic stenosis with Cooper mean gradient of 43 mmHg. She underwent cardiac catheterization on 09/26/2015 that showed nonobstructive CAD with again severe AS the mean gradient was measured at 20 mmHg and the AVA at 0.74 cm2.  She was seen by cardiothoracic surgery and it was determined that her risk for an open surgical AVR was elevated. Decision was made to perform transcatheter aortic valve replacement. She was admitted for planned TAVR.   Hospital Course  Upon admission she was in atrial flutter. She is in chronic atrial fib/flutter. She takes Eliquis as her anticoagulation.  She underwent transcatheter aortic valve replacement on 10/25/2015. Her temporary pacer was kept in place post procedure.  Following the procedure the next morning it was noted that when her temporary pacer is turned off her underlying heart rhythm was 38-42 bpm. He had received no AV nodal blocking agents. EP was consulted for consideration of  permanent pacemaker placement.    She underwent permanent pacemaker placement on 10/26/2015.  Following the pacemaker implantation she had Cooper hematoma at the pacemaker site. At that time her Eliquis had not been restarted. It was held due to pacemaker hematoma.   She ambulated post procedure but felt easily tired and weak. PT was consults noted. PT suggested inpatient rehabilitation.  Patient was evaluated for inpatient rehabilitation and it was felt that inpatient rehabilitation would benefit her however there is no bed available. Outpatient rehabilitation was offered to the patient, but she refused. So she will be going home with home health.   She will be seen by cardiology in one week for Cooper pacemaker pocket check and CBC. At that time if there is no visible hematoma at the pacemaker site, Eliquis can be restarted. Once Eliquis is restarted, stop 324mg  aspirin.  Patient will follow-up with Dr. Angelena Form in one month, and have echo at the same time. _____________  Discharge Vitals Blood pressure 138/59, pulse 81, temperature 98.1 F (36.7 C), temperature source Oral, resp. rate 18, height 5\' 2"  (1.575 m), weight 117 lb (53.071 kg), SpO2 98 %.  Filed Weights   10/25/15 0829 10/27/15 0443 10/28/15 0611  Weight: 112 lb 1.6 oz (50.848 kg) 117 lb (53.071 kg) 117 lb (53.071 kg)    Labs & Radiologic Studies     CBC  Recent Labs  10/27/15 0505 10/28/15 0627  WBC 4.6 5.6  HGB 10.0* 9.3*  HCT 30.2* 26.9*  MCV 91.0 90.0  PLT 113* [id number]*   Basic Metabolic Panel  Recent Labs  10/26/15 0430 10/27/15 0815 10/28/15 0625  NA 132* 132* 134*  K 3.7 4.4 4.2  CL 100* 101 101  CO2 24 24 26   GLUCOSE 113* 137* 94  BUN 6 7 8   CREATININE 0.75 0.82 0.79  CALCIUM 8.0* 8.5* 8.5*  MG 2.5*  --   --     Dg  Chest 2 View  10/27/2015  CLINICAL DATA:  Weakness.  Status post pacer placement EXAM: CHEST  2 VIEW COMPARISON:  10/26/2015 FINDINGS: There is Cooper left chest wall pacer device with lead in the right atrial appendage and right ventricle. No pneumothorax identified. Right IJ catheter tip is in the projection of the SVC. Moderate cardiac enlargement. Aortic atherosclerosis noted. The lungs appear hyperinflated but are clear. Improvement in pulmonary edema pattern. IMPRESSION: 1. Improvement in pulmonary edema. 2. No complication after left chest wall pacer placement. Electronically  Signed   By: Kerby Moors M.D.   On: 10/27/2015 08:17   Dg Chest 2 View  10/21/2015  CLINICAL DATA:  Pre aortic valve replacement evaluation. EXAM: CHEST  2 VIEW COMPARISON:  Chest CTA dated 10/14/2015 and portable chest dated 12/12/2014. FINDINGS: The recently demonstrated left upper lobe nodule suggesting mucoid impaction may be present as Cooper subtle density in the superior aspect of the left upper lobe. This could also represent Cooper prominent first costochondral junction on today's frontal view. The cardiac silhouette is enlarged. The lungs are mildly hyperexpanded with mild diffuse peribronchial thickening and accentuation of the interstitial markings. Cooper linear scar at the left lung base is again demonstrated. Mild scoliosis. Thoracic spine degenerative changes. Diffuse osteopenia. IMPRESSION: 1. Cardiomegaly. 2. Mild changes of COPD and chronic bronchitis. 3. Previously noted left upper lobe nodule with previously recommended CT follow-up. Electronically Signed   By: Claudie Revering M.D.   On: 10/21/2015 15:25   Ct Coronary Morp W/cta Cor W/score W/ca W/cm &/or Wo/cm  10/14/2015  ADDENDUM REPORT: 10/14/2015 13:56 EXAM: OVER-READ INTERPRETATION  CT CHEST The following report is an over-read performed by radiologist Dr. Fonnie Birkenhead [facility] on 10/14/2015. This over-read does not include interpretation of cardiac or coronary anatomy or pathology. The interpretation by the cardiologist is attached. COMPARISON:  None. FINDINGS: Lymph nodes adjacent to the distal esophagus and descending thoracic aorta are sub cm in short axis size. Mosaic attenuation in the lungs is nonspecific. Probable mucoid impaction in the left upper lobe (series 512, image 8). No pericardial or pleural effusion. Cooper fluid density nodule in the medial right breast measures 1.6 cm (series 513, image 48). IMPRESSION: 1. No acute extracardiac findings. 2. Fluid density lesion in the medial right breast. Correlation with physical  exam, mammogram and possibly ultrasound recommended, as clinically indicated. Electronically Signed   By: Lorin Picket M.D.   On: 10/14/2015 13:56  10/14/2015  CLINICAL DATA:  Aortic stenosis EXAM: Cardiac TAVR CT TECHNIQUE: The patient was scanned on Cooper Philips 256 scanner. Cooper 120 kV retrospective scan was triggered in the descending thoracic aorta at 111 HU's. Gantry rotation speed was 270 msecs and collimation was .9 mm. No beta blockade or nitro were given. The 3D data set was reconstructed in 5% intervals of the R-R cycle. Systolic and diastolic phases were analyzed on Cooper dedicated work station using MPR, MIP and VRT modes. The patient received 80 cc of contrast. MEDICATIONS: None FINDINGS: Aortic Valve: Functionally bicuspid. Thickened and calcified leaflet tips Aorta: Mild calcification of the arch and descending thoracic aorta No aneurysm Normal origin of great vessels Sinotubular Junction:  28 mm Ascending Thoracic Aorta:  34 mm Aortic Arch:  29 mm Descending Thoracic Aorta:  23 mm Sinus of Valsalva Measurements: Non-coronary:  28 mm Right -coronary:  31 mm Left -coronary:  31 mm Coronary Artery Height above Annulus: Left Main:  11.2 mm Right Coronary:  14.8 mm Virtual Basal Annulus Measurements:  Maximum/Minimum Diameter:  31 mm x 22.8 mm Perimeter:  87 mm Area:  580 mm2 Coronary Arteries:  Sufficient height above annulus for deployment Optimum Fluoroscopic Angle for Delivery:  LAO 15 degrees IMPRESSION: 1) Calcified functionally bicuspid AV with annulus 580 mm2 suitable for Cooper 29 mm Sapien 3 valve 2) Sufficient coronary artery height for deployment 3) Normal arch and great vessels with no aortic root enlargement 4) Optimum angle for deployment LAO 15 degrees Jenkins Rouge Electronically Signed: By: Jenkins Rouge M.D. On: 10/14/2015 12:44   Dg Chest Port 1 View  10/26/2015  CLINICAL DATA:  Aortic stenosis with aortic valve replacement EXAM: PORTABLE CHEST 1 VIEW COMPARISON:  Oct 25, 2015 FINDINGS: Central  catheter tip is in the superior vena cava. There is Cooper prosthetic aortic valve in place. There is Cooper pacemaker placed from Cooper femoral approach with tip in right ventricle. No pneumothorax. Patchy opacity in the lung apices remains. Lungs elsewhere are clear. There is Cooper minimal left pleural effusion. Heart is mildly enlarged with pulmonary vascular within normal limits. No adenopathy. IMPRESSION: No change in catheter positions. No pneumothorax. Patchy opacity in the upper lobes near the apices bilaterally remain stable. Question mild edema versus subtle pneumonia in the upper lobes. Lungs elsewhere clear. Minimal left effusion. No change in cardiac silhouette. Electronically Signed   By: Lowella Grip III M.D.   On: 10/26/2015 08:05   Dg Chest Port 1 View  10/25/2015  CLINICAL DATA:  TAVR EXAM: PORTABLE CHEST 1 VIEW COMPARISON:  10/21/2015 and earlier FINDINGS: Right IJ central line tip to the superior vena cava. Status post placement of transcatheter aortic valve. Heart is mildly enlarged. There is increased patchy density at the lung apices, partially obscuring Cooper known left upper lobe pulmonary nodule. No pleural effusions. No definite pneumothorax. Skin folds overlie the chest. IMPRESSION: 1. Mild cardiomegaly. 2. Bilateral upper lobe infiltrates or developing edema. Electronically Signed   By: Nolon Nations M.D.   On: 10/25/2015 14:49   Ct Angio Chest Aorta W/cm &/or Wo/cm  10/17/2015  CLINICAL DATA:  82 year old female with history of severe aortic stenosis. Preprocedural study prior to potential transcatheter aortic valve replacement (TAVR) procedure. EXAM: CT ANGIOGRAPHY CHEST, ABDOMEN AND PELVIS TECHNIQUE: Multidetector CT imaging through the chest, abdomen and pelvis was performed using the standard protocol during bolus administration of intravenous contrast. Multiplanar reconstructed images and MIPs were obtained and reviewed to evaluate the vascular anatomy. CONTRAST:  70 mL of Isovue 370.  COMPARISON:  No priors. FINDINGS: CTA CHEST FINDINGS Mediastinum/Lymph Nodes: Heart size is mildly enlarged. There is no significant pericardial fluid, thickening or pericardial calcification. There is atherosclerosis of the thoracic aorta, the great vessels of the mediastinum and the coronary arteries, including calcified atherosclerotic plaque in the left anterior descending and right coronary arteries. Severe thickening and calcification of the aortic valve, compatible with the reported clinical history of aortic stenosis. No pathologically enlarged mediastinal or hilar lymph nodes. Esophagus is unremarkable in appearance. No axillary lymphadenopathy. Lungs/Pleura: Scattered areas of mild scarring are noted throughout the lungs bilaterally, most evident in the right middle lobe and anterior aspect of the lingula. In the left upper lobe there is Cooper 12 x 5 mm (mean diameter of 9 mm) pulmonary nodule which appears to have Cooper branching appearance on coronal reconstructions (image 37 of series 408), most favored to represent an area of mucoid impaction. Cooper few other scattered areas of apparent mucoid impaction are also noted. No acute consolidative airspace disease. No  pleural effusions. There is Cooper mosaic attenuation throughout the lung parenchyma with areas of lucency interspersed with areas of mild ground-glass attenuation, likely to reflect air trapping from small airways disease. Musculoskeletal/Soft Tissues: There are no aggressive appearing lytic or blastic lesions noted in the visualized portions of the skeleton. CTA ABDOMEN AND PELVIS FINDINGS Hepatobiliary: No cystic or solid hepatic lesions. No intra or extrahepatic biliary ductal dilatation. Gallbladder is normal in appearance. Pancreas: No pancreatic mass. No pancreatic ductal dilatation. No pancreatic or peripancreatic fluid or inflammatory changes. Spleen: Unremarkable. Adrenals/Urinary Tract: 12 mm low-attenuation lesion in the interpolar region of the  right kidney is compatible with Cooper tiny cyst. Other smaller sub cm low-attenuation lesions in the kidneys bilaterally are too small to definitively characterize, but are also favored to represent tiny cysts. Bilateral adrenal glands are normal in appearance. There is no hydroureteronephrosis. Urinary bladder is trabeculated, and there are several bladder wall diverticulae. Stomach/Bowel: The stomach is normal in appearance. No pathologic dilatation of small bowel or colon. The appendix is not confidently identified may be surgically absent. Regardless, there are no inflammatory changes noted adjacent to the cecum to suggest the presence of an acute appendicitis at this time. Vascular/Lymphatic: Vascular findings and measurements pertinent to potential TAVR procedure, as detailed below. No aneurysm or dissection identified in the abdominal or pelvic vasculature. No lymphadenopathy noted in the abdomen or pelvis. Reproductive: Uterus is retroverted, and markedly heterogeneous in appearance with multiple coarse calcifications, compatible with Cooper fibroid uterus. Ovaries are atrophic. Other: No significant ascites and no pneumoperitoneum noted. Musculoskeletal: There are no aggressive appearing lytic or blastic lesions noted in the visualized portions of the skeleton. VASCULAR MEASUREMENTS PERTINENT TO TAVR: AORTA: Minimal Aortic Diameter -  14 x 14 mm Severity of Aortic Calcification -  moderate to severe RIGHT PELVIS: Right Common Iliac Artery - Minimal Diameter - 8.5 x 8.6 mm Tortuosity - mild Calcification - mild Right External Iliac Artery - Minimal Diameter - 5.8 x 5.7 mm Tortuosity - moderate Calcification - none Right Common Femoral Artery - Minimal Diameter - 6.1 x 5.9 mm Tortuosity - mild Calcification - mild LEFT PELVIS: Left Common Iliac Artery - Minimal Diameter - 8.2 x 8.4 mm Tortuosity - mild Calcification - mild Left External Iliac Artery - Minimal Diameter - 5.8 x 6.2 mm Tortuosity - moderate Calcification  - none Left Common Femoral Artery - Minimal Diameter - 6.2 x 5.5 mm Tortuosity - mild Calcification - mild Review of the MIP images confirms the above findings. IMPRESSION: 1. Vascular findings and measurements pertinent to potential TAVR procedure, as detailed above. This patient may have suitable pelvic arterial access for low profile devices, as detailed above. 2. Severe thickening and calcification of the aortic valve, compatible with the reported clinical history of severe aortic stenosis. 3. Probable air trapping in the lungs, suggestive of small airways disease. 4. Scattered pulmonary nodules, as discussed above, the largest of which in the left upper lobe has Cooper mean diameter of 9 mm, but appears to have Cooper branching configuration, suggestive of mucoid impaction. Follow-up noncontrast chest CT is recommended in 3 months to evaluate for the stability or resolution of this finding. 5. Additional incidental findings, as above. Electronically Signed   By: Vinnie Langton M.D.   On: 10/17/2015 11:53   Ct Angio Abd/pel W/ And/or W/o  10/17/2015  CLINICAL DATA:  82 year old female with history of severe aortic stenosis. Preprocedural study prior to potential transcatheter aortic valve replacement (TAVR) procedure. EXAM: CT ANGIOGRAPHY  CHEST, ABDOMEN AND PELVIS TECHNIQUE: Multidetector CT imaging through the chest, abdomen and pelvis was performed using the standard protocol during bolus administration of intravenous contrast. Multiplanar reconstructed images and MIPs were obtained and reviewed to evaluate the vascular anatomy. CONTRAST:  70 mL of Isovue 370. COMPARISON:  No priors. FINDINGS: CTA CHEST FINDINGS Mediastinum/Lymph Nodes: Heart size is mildly enlarged. There is no significant pericardial fluid, thickening or pericardial calcification. There is atherosclerosis of the thoracic aorta, the great vessels of the mediastinum and the coronary arteries, including calcified atherosclerotic plaque in the left  anterior descending and right coronary arteries. Severe thickening and calcification of the aortic valve, compatible with the reported clinical history of aortic stenosis. No pathologically enlarged mediastinal or hilar lymph nodes. Esophagus is unremarkable in appearance. No axillary lymphadenopathy. Lungs/Pleura: Scattered areas of mild scarring are noted throughout the lungs bilaterally, most evident in the right middle lobe and anterior aspect of the lingula. In the left upper lobe there is Cooper 12 x 5 mm (mean diameter of 9 mm) pulmonary nodule which appears to have Cooper branching appearance on coronal reconstructions (image 37 of series 408), most favored to represent an area of mucoid impaction. Cooper few other scattered areas of apparent mucoid impaction are also noted. No acute consolidative airspace disease. No pleural effusions. There is Cooper mosaic attenuation throughout the lung parenchyma with areas of lucency interspersed with areas of mild ground-glass attenuation, likely to reflect air trapping from small airways disease. Musculoskeletal/Soft Tissues: There are no aggressive appearing lytic or blastic lesions noted in the visualized portions of the skeleton. CTA ABDOMEN AND PELVIS FINDINGS Hepatobiliary: No cystic or solid hepatic lesions. No intra or extrahepatic biliary ductal dilatation. Gallbladder is normal in appearance. Pancreas: No pancreatic mass. No pancreatic ductal dilatation. No pancreatic or peripancreatic fluid or inflammatory changes. Spleen: Unremarkable. Adrenals/Urinary Tract: 12 mm low-attenuation lesion in the interpolar region of the right kidney is compatible with Cooper tiny cyst. Other smaller sub cm low-attenuation lesions in the kidneys bilaterally are too small to definitively characterize, but are also favored to represent tiny cysts. Bilateral adrenal glands are normal in appearance. There is no hydroureteronephrosis. Urinary bladder is trabeculated, and there are several bladder wall  diverticulae. Stomach/Bowel: The stomach is normal in appearance. No pathologic dilatation of small bowel or colon. The appendix is not confidently identified may be surgically absent. Regardless, there are no inflammatory changes noted adjacent to the cecum to suggest the presence of an acute appendicitis at this time. Vascular/Lymphatic: Vascular findings and measurements pertinent to potential TAVR procedure, as detailed below. No aneurysm or dissection identified in the abdominal or pelvic vasculature. No lymphadenopathy noted in the abdomen or pelvis. Reproductive: Uterus is retroverted, and markedly heterogeneous in appearance with multiple coarse calcifications, compatible with Cooper fibroid uterus. Ovaries are atrophic. Other: No significant ascites and no pneumoperitoneum noted. Musculoskeletal: There are no aggressive appearing lytic or blastic lesions noted in the visualized portions of the skeleton. VASCULAR MEASUREMENTS PERTINENT TO TAVR: AORTA: Minimal Aortic Diameter -  14 x 14 mm Severity of Aortic Calcification -  moderate to severe RIGHT PELVIS: Right Common Iliac Artery - Minimal Diameter - 8.5 x 8.6 mm Tortuosity - mild Calcification - mild Right External Iliac Artery - Minimal Diameter - 5.8 x 5.7 mm Tortuosity - moderate Calcification - none Right Common Femoral Artery - Minimal Diameter - 6.1 x 5.9 mm Tortuosity - mild Calcification - mild LEFT PELVIS: Left Common Iliac Artery - Minimal Diameter - 8.2 x 8.4  mm Tortuosity - mild Calcification - mild Left External Iliac Artery - Minimal Diameter - 5.8 x 6.2 mm Tortuosity - moderate Calcification - none Left Common Femoral Artery - Minimal Diameter - 6.2 x 5.5 mm Tortuosity - mild Calcification - mild Review of the MIP images confirms the above findings. IMPRESSION: 1. Vascular findings and measurements pertinent to potential TAVR procedure, as detailed above. This patient may have suitable pelvic arterial access for low profile devices, as detailed  above. 2. Severe thickening and calcification of the aortic valve, compatible with the reported clinical history of severe aortic stenosis. 3. Probable air trapping in the lungs, suggestive of small airways disease. 4. Scattered pulmonary nodules, as discussed above, the largest of which in the left upper lobe has Cooper mean diameter of 9 mm, but appears to have Cooper branching configuration, suggestive of mucoid impaction. Follow-up noncontrast chest CT is recommended in 3 months to evaluate for the stability or resolution of this finding. 5. Additional incidental findings, as above. Electronically Signed   By: Vinnie Langton M.D.   On: 10/17/2015 11:53    Disposition   Pt is being discharged home today in good condition.  Follow-up Plans & Appointments    Follow-up Information    Follow up with [facility] On 10/31/2015.   Specialty:  Cardiology   Why:  11:00AM, wound check (pacemaker)   Contact information:   [address] [place] [phone]      Follow up with Will Meredith Leeds, MD On 01/30/2016.   Specialty:  Cardiology   Why:  10:30AM   Contact information:   [place] [address] [phone]       Follow up with [facility] On 11/07/2015.   Specialty:  Cardiology   Why:  11:00AM, wound check   Contact information:   [address] [place] [phone]     Discharge Instructions    Diet - low sodium heart healthy    Complete by:  As directed      Increase activity slowly    Complete by:  As directed            Discharge Medications   Current Discharge Medication List    START taking these medications   Details  aspirin EC 325 MG EC tablet Take 1 tablet (325 mg total) by mouth daily. Qty: 30 tablet, Refills: 0      CONTINUE these medications which have NOT CHANGED   Details  albuterol (PROVENTIL HFA;VENTOLIN HFA)  108 (90 BASE) MCG/ACT inhaler Inhale 2 puffs into the lungs every 4 (four) hours as needed for wheezing or shortness of breath.     amLODipine (NORVASC) 2.5 MG tablet Take 1 tablet (2.5 mg total) by mouth daily. Qty: 30 tablet, Refills: 0    furosemide (LASIX) 40 MG tablet Take 1.5 tablets (60 mg total) by mouth daily. Qty: 45 tablet, Refills: 6      STOP taking these medications     apixaban (ELIQUIS) 2.5 MG TABS tablet             Outstanding Labs/Studies   CBC (pt had hematoma at pacemaker site).   Duration of Discharge Encounter   Greater than 30 minutes including physician time.  Signed, Arbutus Leas NP 10/28/2015, 4:12 PM   I have personally seen and examined this patient. I agree with the assessment and plan  as outlined above.   MCALHANY,CHRISTOPHER 10/28/2015 4:43 PM

## 2015-10-28 NOTE — Consult Note (Signed)
Physical Medicine and Rehabilitation Consult  Reason for Consult: Debility Referring Physician: Dr. Angelena Form   HPI: Crystal Cooper is a 82 y.o. female with history of HTN, A fib--on eliquis, chronic diastolic CHF, severe aortic stenosis with progressive dyspnea x 6 months. She elected to undergo TVAR on 10/25/15 by Dr. Bascom Levels. Angelena Form. Post op with HR in 30-40 range and dual chamber pacemaker placed 05/03.  Repeat echo with Hematoma pacemakers site improved with compression and ABLA being monitored. PT evaluation done today and patient noted to be debilitated. CIR recommended for follow up therapy.    Review of Systems  Constitutional: Positive for malaise/fatigue.  HENT: Negative for hearing loss.   Eyes: Negative for blurred vision.  Respiratory: Positive for shortness of breath. Negative for cough.   Cardiovascular: Negative for chest pain, palpitations and leg swelling.  Gastrointestinal: Negative for heartburn, nausea and diarrhea.  Genitourinary: Negative for dysuria and urgency.  Musculoskeletal: Negative for myalgias, back pain and joint pain.  Neurological: Positive for weakness. Negative for dizziness, sensory change, speech change and headaches.  Psychiatric/Behavioral: Negative for memory loss. The patient does not have insomnia.       Past Medical History  Diagnosis Date  . HTN (hypertension)   . Asthma   . Glaucoma   . Hearing loss   . Prolapsing mitral leaflet syndrome   . SVT (supraventricular tachycardia) (HCC)     S/P ablation of AVNRT in 2003  . Aortic stenosis   . Atrial fibrillation ([place])   . Heart murmur   . Shortness of breath dyspnea     with exertion  . CHF (congestive heart failure) ([place]) 11/2014  . Pneumonia   . Arthritis   . Anemia     years ago  . Family history of adverse reaction to anesthesia     2 daughters would have N/V    Past Surgical History  Procedure Laterality Date  . Appendectomy    . Bladder surgery    .  Cardiac surgery    . Nasal sinus surgery    . Cardiac catheterization N/A 09/26/2015    Procedure: Right/Left Heart Cath and Coronary Angiography;  Surgeon: Burnell Blanks, MD;  Location: [facility];  Service: Cardiovascular;  Laterality: N/A;  . Eye surgery Bilateral     cataract surgery  . Ep implantable device N/A 10/26/2015    Procedure: Pacemaker Implant;  Surgeon: Will Meredith Leeds, MD;  Location: [facility];  Service: Cardiovascular;  Laterality: N/A;  . Transcatheter aortic valve replacement, transfemoral Right 10/25/2015    Procedure: TRANSCATHETER AORTIC VALVE REPLACEMENT, TRANSFEMORAL;  Surgeon: Burnell Blanks, MD;  Location: [facility];  Service: Open Heart Surgery;  Laterality: Right;  . Tee without cardioversion N/A 10/25/2015    Procedure: TRANSESOPHAGEAL ECHOCARDIOGRAM (TEE);  Surgeon: Burnell Blanks, MD;  Location: [facility];  Service: Open Heart Surgery;  Laterality: N/A;    Family History  Problem Relation Age of Onset  . Pneumonia Father     Social History:  Lives alone. Independent with cane PTA--children help with housework and meals. She has been sedentary  since last June and has driven once in the past 9 months. She  reports that she has never smoked. She has never used smokeless tobacco. She reports that she does not drink alcohol or use illicit drugs.    Allergies  Allergen Reactions  . Hctz [Hydrochlorothiazide] Other (See Comments)    Pt was ill and affected kidneys  Medications Prior to Admission  Medication Sig Dispense Refill  . albuterol (PROVENTIL HFA;VENTOLIN HFA) 108 (90 BASE) MCG/ACT inhaler Inhale 2 puffs into the lungs every 4 (four) hours as needed for wheezing or shortness of breath.     [name] amLODipine (NORVASC) 2.5 MG tablet Take 1 tablet (2.5 mg total) by mouth daily. 30 tablet 0  . apixaban (ELIQUIS) 2.5 MG TABS tablet Take 1 tablet (2.5 mg total) by mouth 2 (two) times daily. 60 tablet 1  . furosemide (LASIX) 40  MG tablet Take 1.5 tablets (60 mg total) by mouth daily. (Patient taking differently: Take 60 mg by mouth daily. Take one and one-half tablets (60mg ) daily.) 45 tablet 6    Home: Home Living Family/patient expects to be discharged to:: Private residence Living Arrangements: Alone Available Help at Discharge: Family, Available PRN/intermittently Type of Home: House Home Access: Stairs to enter Technical brewer of Steps: 3 Entrance Stairs-Rails: Left Home Layout: One level Bathroom Toilet: Standard Bathroom Accessibility: No Home Equipment: Environmental consultant - 2 wheels, Cane - single point  Functional History: Prior Function Level of Independence: Independent with assistive device(s) Functional Status:  Mobility: Bed Mobility General bed mobility comments: up in chair when PT entered Transfers Overall transfer level: Needs assistance Equipment used: 1 person hand held assist, Straight cane Transfers: Sit to/from Stand, Stand Pivot Transfers Sit to Stand: Min guard, Min assist Stand pivot transfers: Min guard, Min assist General transfer comment: Pt is forgetting to use RUE and tends to try to get LUE out of sling Ambulation/Gait Ambulation/Gait assistance: Min guard, Min assist Ambulation Distance (Feet): 150 Feet Assistive device: Straight cane, 1 person hand held assist Gait Pattern/deviations: Step-through pattern, Shuffle, Narrow base of support, Trunk flexed, Drifts right/left General Gait Details: Pt is SOB with the effort and O2 sats were 95% post gait Gait velocity: reduced Gait velocity interpretation: Below normal speed for age/gender Stairs:  (Pt was SOB with hallway walks)    ADL:    Cognition: Cognition Overall Cognitive Status: Within Functional Limits for tasks assessed Orientation Level: Oriented X4 Cognition Arousal/Alertness: Awake/alert Behavior During Therapy: WFL for tasks assessed/performed Overall Cognitive Status: Within Functional Limits for tasks  assessed    Blood pressure 158/68, pulse 61, temperature 98.1 F (36.7 C), temperature source Axillary, resp. rate 20, height 5\' 2"  (1.575 m), weight 53.071 kg (117 lb), SpO2 99 %. Physical Exam  Nursing note and vitals reviewed. Constitutional: She is oriented to person, place, and time. She appears well-developed and well-nourished.  LUE in sling  HENT:  Head: Normocephalic and atraumatic.  Eyes: Conjunctivae are normal. Pupils are equal, round, and reactive to light.  Neck: Normal range of motion. Neck supple.  Cardiovascular: Normal rate.   Murmur heard. Respiratory: Effort normal and breath sounds normal. No stridor. No respiratory distress. She has no wheezes.  GI: Soft. Bowel sounds are normal. She exhibits no distension. There is no tenderness.  Musculoskeletal: She exhibits edema (lower ext trace).  Neurological: She is alert and oriented to person, place, and time.  Cognitively intact. Strength grossly 4/5 LUE and 5/5 RUE. Lower ext 4/5 prox to 5/5 distal. No sensory changes.  Skin: Skin is warm and dry.  Psychiatric: She has a normal mood and affect. Her behavior is normal. Judgment and thought content normal.    Results for orders placed or performed during the hospital encounter of 10/25/15 (from the past 24 hour(s))  Basic metabolic panel     Status: Abnormal   Collection Time: 10/28/15  6:25 AM  Result Value Ref Range   Sodium 134 (L) 135 - 145 mmol/L   Potassium 4.2 3.5 - 5.1 mmol/L   Chloride 101 101 - 111 mmol/L   CO2 26 22 - 32 mmol/L   Glucose, Bld 94 65 - 99 mg/dL   BUN 8 6 - 20 mg/dL   Creatinine, Ser 0.79 0.44 - 1.00 mg/dL   Calcium 8.5 (L) 8.9 - 10.3 mg/dL   GFR calc non Af Amer >60 >60 mL/min   GFR calc Af Amer >60 >60 mL/min   Anion gap 7 5 - 15  CBC     Status: Abnormal   Collection Time: 10/28/15  6:27 AM  Result Value Ref Range   WBC 5.6 4.0 - 10.5 K/uL   RBC 2.99 (L) 3.87 - 5.11 MIL/uL   Hemoglobin 9.3 (L) 12.0 - 15.0 g/dL   HCT 26.9 (L)  36.0 - 46.0 %   MCV 90.0 78.0 - 100.0 fL   MCH 31.1 26.0 - 34.0 pg   MCHC 34.6 30.0 - 36.0 g/dL   RDW 13.9 11.5 - 15.5 %   Platelets 113 (L) 150 - 400 K/uL   Dg Chest 2 View  10/27/2015  CLINICAL DATA:  Weakness.  Status post pacer placement EXAM: CHEST  2 VIEW COMPARISON:  10/26/2015 FINDINGS: There is a left chest wall pacer device with lead in the right atrial appendage and right ventricle. No pneumothorax identified. Right IJ catheter tip is in the projection of the SVC. Moderate cardiac enlargement. Aortic atherosclerosis noted. The lungs appear hyperinflated but are clear. Improvement in pulmonary edema pattern. IMPRESSION: 1. Improvement in pulmonary edema. 2. No complication after left chest wall pacer placement. Electronically Signed   By: Kerby Moors M.D.   On: 10/27/2015 08:17    Assessment/Plan: Diagnosis: debility related to AS and recent TAVR 1. Does the need for close, 24 hr/day medical supervision in concert with the patient's rehab needs make it unreasonable for this patient to be served in a less intensive setting? Potentially 2. Co-Morbidities requiring supervision/potential complications: afib, htn 3. Due to bladder management, bowel management, safety, skin/wound care, disease management, medication administration, pain management and patient education, does the patient require 24 hr/day rehab nursing? Yes 4. Does the patient require coordinated care of a physician, rehab nurse, PT (1-2 hrs/day, 5 days/week) and OT (1-2 hrs/day, 5 days/week) to address physical and functional deficits in the context of the above medical diagnosis(es)? Yes and Potentially Addressing deficits in the following areas: balance, endurance, locomotion, strength, transferring, bowel/bladder control, bathing, dressing, feeding, grooming and toileting 5. Can the patient actively participate in an intensive therapy program of at least 3 hrs of therapy per day at least 5 days per week? Yes 6. The  potential for patient to make measurable gains while on inpatient rehab is good and fair 7. Anticipated functional outcomes upon discharge from inpatient rehab are modified independent  with PT, modified independent with OT, modified independent with SLP. 8. Estimated rehab length of stay to reach the above functional goals is: 5-7 days if needed 9. Does the patient have adequate social supports and living environment to accommodate these discharge functional goals? Yes 10. Anticipated D/C setting: Home 11. Anticipated post D/C treatments: [place] therapy 12. Overall Rehab/Functional Prognosis: excellent  RECOMMENDATIONS: This patient's condition is appropriate for continued rehabilitative care in the following setting: Home health vs brief CIR admit Patient has agreed to participate in recommended program. Yes and Potentially Note that insurance prior  authorization may be required for reimbursement for recommended care.  Comment: Pt doing fairly well. Pending bed availability could benefit from a brief stay. Her preference, however,  is to go home if she's safe enough to do so. Per patient, her daughter will be with her initially once home  Meredith Staggers, MD, [facility] 10/28/2015     10/28/2015

## 2015-10-28 NOTE — Telephone Encounter (Signed)
TOC patient-fu appt 11-04-15 with Katie at [facility]

## 2015-10-28 NOTE — Clinical Documentation Improvement (Signed)
Cardiothoracic  Can the diagnosis of Atrial Flutter be further specified by type ?  Thank you    Atypical (Type II)  Typical (Type I)  Other  Clinically Undetermined  Document any associated diagnoses/conditions.   Supporting Information:  Afib/Flutter w SVR , Severe AS s/p TAVR   Evaluation: Telemetry    Please exercise your independent, professional judgment when responding. A specific answer is not anticipated or expected.   Thank You,  [place] [phone]

## 2015-10-28 NOTE — Progress Notes (Signed)
Rehab admissions - I met with patient and family at the bedside.  Patient prefers to discharge home with home health.  I did let patient and family know that we have no rehab beds open today or tomorrow.  Call me for questions.  #[phone]

## 2015-10-28 NOTE — Progress Notes (Signed)
     SUBJECTIVE: No chest pain or dyspnea.   Tele: paced  BP 158/68 mmHg  Pulse 61  Temp(Src) 98.1 F (36.7 C) (Axillary)  Resp 20  Ht 5\' 2"  (1.575 m)  Wt 117 lb (53.071 kg)  BMI 21.39 kg/m2  SpO2 99%  Intake/Output Summary (Last 24 hours) at 10/28/15 0710 Last data filed at 10/27/15 1800  Gross per 24 hour  Intake    840 ml  Output   1200 ml  Net   -360 ml    PHYSICAL EXAM General: Well developed, well nourished, in no acute distress. Alert and oriented x 3.  Psych:  Good affect, responds appropriately Neck: No JVD. No masses noted.  Lungs: Clear bilaterally with no wheezes or rhonci noted.  Heart: RRR with no murmurs noted. Abdomen: Bowel sounds are present. Soft, non-tender.  Extremities: No lower extremity edema.   LABS: Basic Metabolic Panel:  Recent Labs  10/26/15 0430 10/27/15 0815  NA 132* 132*  K 3.7 4.4  CL 100* 101  CO2 24 24  GLUCOSE 113* 137*  BUN 6 7  CREATININE 0.75 0.82  CALCIUM 8.0* 8.5*  MG 2.5*  --    CBC:  Recent Labs  10/27/15 0505 10/28/15 0627  WBC 4.6 5.6  HGB 10.0* 9.3*  HCT 30.2* 26.9*  MCV 91.0 90.0  PLT 113* 113*   Current Meds: . acetaminophen  1,000 mg Oral Q6H   Or  . acetaminophen (TYLENOL) oral liquid 160 mg/5 mL  1,000 mg Per Tube Q6H  . acetaminophen (TYLENOL) oral liquid 160 mg/5 mL  650 mg Per Tube Once   Or  . acetaminophen  650 mg Rectal Once  . amLODipine  2.5 mg Oral Daily  . aspirin EC  325 mg Oral Daily   Or  . aspirin  324 mg Per Tube Daily  . pantoprazole  40 mg Oral Daily     ASSESSMENT AND PLAN:   1. Severe aortic stenosis: She is stable this am POD #3 s/p TAVR. She is on ASA 325 mg daily. Will not restart Eliquis today due to hematoma at pacemaker site left chest wall. Echo 10/26/15 with normally functioning AVR. Once Eliquis is restarted, would stop ASA.   2. Atrial flutter/bradycardia: Pt had baseline atrial flutter with bradycardia. Post procedure no change in baseline HR 35-40 bpm.  Permanent pacemaker implanted 10/26/15. She has a hematoma at the pacemaker site. Will ambulate today. PT consult. Will not restart Eliquis for several days given pacemaker pocket hematoma. Will ask EP team to see her this am to comment. I would anticipate f/u in our office early next week with APP with pacemaker pocket check and CBC and if hematoma is improved, can restart Eliquis then.   3. Anemia: Expected blood loss post TAVR and with hematoma pacer pocket. Will need CBC early next week.   D/c home later today if ambulating ok. She will need one week f/u with office APP for pacer pocket check, CBC and then one month f/u with me with echo.     Crystal Cooper  5/5/20177:10 AM

## 2015-10-28 NOTE — Care Management Important Message (Signed)
Important Message  Patient Details  Name: Crystal Cooper MRN: [id number] Date of Birth: 12-05-1926   Medicare Important Message Given:  Yes    [name] 10/28/2015, 3:00 PM

## 2015-10-28 NOTE — Progress Notes (Signed)
CARDIAC REHAB PHASE I   PRE:  Rate/Rhythm: paced 61  BP:  Supine:   Sitting: 142/51  Standing:    SaO2: 97%RA  MODE:  Ambulation: 150 ft   POST:  Rate/Rhythm: 68 paced   BP:  Supine:   Sitting: 160/58  Standing:    SaO2: 96%RA 1410-1450 Pt walked 150 ft on RA with cane and gait belt and asst x 1 with fairly steady gait. Much better than yesterday. To recliner after walk. Kept sling on so pt would not use left arm too much. Gave low sodium diet handouts and reviewed when to call MD with weight gain. Pt weighs daily and has been watching sodium. Discussed CRP 2 but family and pt stated would not be appropriate for pt.   Graylon Good, RN BSN  10/28/2015 2:46 PM

## 2015-10-28 NOTE — Care Management Note (Signed)
Case Management Note Marvetta Gibbons RN, BSN Unit 2W-Case Manager [phone]  Patient Details  Name: Crystal Cooper MRN: [id number] Date of Birth: 1926/07/11  Subjective/Objective:  Pt admitted s/p TAVR then also PPM                  Action/Plan: PTA pt lived at home- recommendations were for CIR- however there are no beds available and pt stable for discharge- in to speak with pt and family at bedside regarding d/c options- STSNF as backup offered to pt vs home with [facility]- per daughter family has arranged for pt to have someone with her for assistance at home if pt returns home with [facility]- per pt she has used [facility] in the past for services- after discussion of safety of going home with [facility]- vs going to a STSNF for rehab-  pt has decided that she would rather return home with [facility] and does not wish to pursue STSNF option- again confirmed pt choice for [facility] agency as [facility] - pt has no DME needs- confirmed that family to be there with pt at home- call made to [place] PA with cardiology to let her know pt desire to d/c home with [facility]- orders to be placed for HH-RN/PT/OT/aide- referral made to [facility] with [facility] for [facility] services-   Expected Discharge Date:      10/28/15            Expected Discharge Plan:  [place]  In-House Referral:     Discharge planning Services  CM Consult  Post Acute Care Choice:  Home Health Choice offered to:  Patient, Adult Children  DME Arranged:    DME Agency:     HH Arranged:  PT, RN, OT, Nurse's Aide [place] Agency:  [facility] [place]  Status of Service:  Completed, signed off  Medicare Important Message Given:  Yes Date Medicare IM Given:    Medicare IM give by:    Date Additional Medicare IM Given:    Additional Medicare Important Message give by:     If discussed at [place] of Stay Meetings, dates discussed:    Discharge Disposition: home/home health   Additional Comments:  Dawayne Patricia, RN 10/28/2015, 4:39 PM

## 2015-10-28 NOTE — Progress Notes (Signed)
      [address]       [place] [address]             [phone]      2 Days Post-Op Procedure(s) (LRB): Pacemaker Implant (N/A)   Subjective:  [name] has no complaints.    Objective: Vital signs in last 24 hours: Temp:  [97.7 F (36.5 C)-98.1 F (36.7 C)] 98.1 F (36.7 C) (05/05 0611) Pulse Rate:  [59-61] 61 (05/05 0611) Cardiac Rhythm:  [-] Ventricular paced (05/04 1946) Resp:  [18-20] 20 (05/05 0611) BP: (136-172)/(59-80) 158/68 mmHg (05/05 0638) SpO2:  [99 %-100 %] 99 % (05/05 0611) Weight:  [117 lb (53.071 kg)] 117 lb (53.071 kg) (05/05 0611)  Intake/Output from previous day: 05/04 0701 - 05/05 0700 In: 840 [P.O.:840] Out: 1200 [Urine:1200]  General appearance: alert, cooperative and no distress Heart: regular rate and rhythm and paced Lungs: clear to auscultation bilaterally Abdomen: soft, non-tender; bowel sounds normal; no masses,  no organomegaly Wound: hematoma at PPM site, clean and dry  Lab Results:  Recent Labs  10/27/15 0505 10/28/15 0627  WBC 4.6 5.6  HGB 10.0* 9.3*  HCT 30.2* 26.9*  PLT 113* 113*   BMET:  Recent Labs  10/27/15 0815 10/28/15 0625  NA 132* 134*  K 4.4 4.2  CL 101 101  CO2 24 26  GLUCOSE 137* 94  BUN 7 8  CREATININE 0.82 0.79  CALCIUM 8.5* 8.5*    PT/INR:  Recent Labs  10/25/15 1515  LABPROT 18.6*  INR 1.54*   ABG    Component Value Date/Time   PHART 7.404 10/25/2015 1513   HCO3 27.5* 10/25/2015 1513   TCO2 26 10/25/2015 1518   ACIDBASEDEF 0.6 12/12/2014 0423   O2SAT 99.0 10/25/2015 1513   CBG (last 3)   Recent Labs  10/26/15 1226 10/26/15 1606 10/27/15 0822  GLUCAP 92 96 133*    Assessment/Plan: S/P Procedure(s) (LRB): Pacemaker Implant (N/A)  1. CV- remains hemodynamically stable 2. Hematoma at [facility] site, hold Eliquis for now 3. Dispo- patient stable, needs to ambulate, possibly for discharge later today   LOS: 3 days    Ahmed Prima, Jaken Fregia 10/28/2015

## 2015-10-31 ENCOUNTER — Encounter: Payer: Self-pay | Admitting: Cardiology

## 2015-10-31 ENCOUNTER — Ambulatory Visit (INDEPENDENT_AMBULATORY_CARE_PROVIDER_SITE_OTHER): Payer: Medicare Other | Admitting: *Deleted

## 2015-10-31 ENCOUNTER — Telehealth: Payer: Self-pay | Admitting: *Deleted

## 2015-10-31 ENCOUNTER — Ambulatory Visit: Payer: Medicare Other | Admitting: Cardiology

## 2015-10-31 DIAGNOSIS — I4819 Other persistent atrial fibrillation: Secondary | ICD-10-CM

## 2015-10-31 DIAGNOSIS — I481 Persistent atrial fibrillation: Secondary | ICD-10-CM

## 2015-10-31 DIAGNOSIS — T148XXA Other injury of unspecified body region, initial encounter: Secondary | ICD-10-CM

## 2015-10-31 LAB — TYPE AND SCREEN
ABO/RH(D): A POS
ANTIBODY SCREEN: NEGATIVE
UNIT DIVISION: 0
Unit division: 0

## 2015-10-31 NOTE — Telephone Encounter (Signed)
LMTCB//sss 

## 2015-10-31 NOTE — Telephone Encounter (Signed)
Patient has a wound check this morning. Discussed with Tobin Chad in device clinic- she will call me when the patient is here.

## 2015-10-31 NOTE — Telephone Encounter (Signed)
New Message:    Daughter said pt was her earlier and saw you,please call her.

## 2015-10-31 NOTE — Telephone Encounter (Signed)
Discussed with pt when she was here in office for wound check on 10/31/15.  Patient contacted regarding discharge from  [facility] on 5/517.  Patient understands to follow up with provider. Yes.  Angelena Form, PA on 11/04/15 at 8:00 Patient understands discharge instructions?  yes Patient understands medications and regiment? yes Patient understands to bring all medications to this visit? yes

## 2015-11-01 DIAGNOSIS — R001 Bradycardia, unspecified: Secondary | ICD-10-CM | POA: Diagnosis not present

## 2015-11-01 DIAGNOSIS — I5030 Unspecified diastolic (congestive) heart failure: Secondary | ICD-10-CM | POA: Diagnosis not present

## 2015-11-01 DIAGNOSIS — I350 Nonrheumatic aortic (valve) stenosis: Secondary | ICD-10-CM | POA: Diagnosis not present

## 2015-11-01 DIAGNOSIS — I110 Hypertensive heart disease with heart failure: Secondary | ICD-10-CM | POA: Diagnosis not present

## 2015-11-01 DIAGNOSIS — Z7901 Long term (current) use of anticoagulants: Secondary | ICD-10-CM | POA: Diagnosis not present

## 2015-11-01 DIAGNOSIS — Z48812 Encounter for surgical aftercare following surgery on the circulatory system: Secondary | ICD-10-CM | POA: Diagnosis not present

## 2015-11-01 DIAGNOSIS — I4891 Unspecified atrial fibrillation: Secondary | ICD-10-CM | POA: Diagnosis not present

## 2015-11-01 DIAGNOSIS — Z5181 Encounter for therapeutic drug level monitoring: Secondary | ICD-10-CM | POA: Diagnosis not present

## 2015-11-01 DIAGNOSIS — Z950 Presence of cardiac pacemaker: Secondary | ICD-10-CM | POA: Diagnosis not present

## 2015-11-01 LAB — CUP PACEART INCLINIC DEVICE CHECK
Battery Voltage: 3.14 V
Brady Statistic AP VP Percent: 0.18 %
Brady Statistic AP VS Percent: 0.01 %
Brady Statistic RA Percent Paced: 0.2 %
Brady Statistic RV Percent Paced: 67.75 %
Implantable Lead Implant Date: 20170503
Implantable Lead Location: 753859
Implantable Lead Location: 753860
Implantable Lead Model: 5076
Implantable Lead Model: 5076
Lead Channel Impedance Value: 380 Ohm
Lead Channel Impedance Value: 399 Ohm
Lead Channel Impedance Value: 399 Ohm
Lead Channel Impedance Value: 475 Ohm
Lead Channel Pacing Threshold Amplitude: 0.75 V
Lead Channel Sensing Intrinsic Amplitude: 1.25 mV
Lead Channel Sensing Intrinsic Amplitude: 1.375 mV
MDC IDC LEAD IMPLANT DT: 20170503
MDC IDC MSMT LEADCHNL RV PACING THRESHOLD PULSEWIDTH: 0.4 ms
MDC IDC MSMT LEADCHNL RV SENSING INTR AMPL: 16.125 mV
MDC IDC MSMT LEADCHNL RV SENSING INTR AMPL: 17.875 mV
MDC IDC SESS DTM: 20170508152952
MDC IDC SET LEADCHNL RA PACING AMPLITUDE: 3.5 V
MDC IDC SET LEADCHNL RV PACING AMPLITUDE: 3.5 V
MDC IDC SET LEADCHNL RV PACING PULSEWIDTH: 0.4 ms
MDC IDC SET LEADCHNL RV SENSING SENSITIVITY: 2.8 mV
MDC IDC STAT BRADY AS VP PERCENT: 67.56 %
MDC IDC STAT BRADY AS VS PERCENT: 32.24 %

## 2015-11-01 NOTE — Telephone Encounter (Signed)
[name] wanted to know if patient would be ok to push herself out of a chair. I told her that this would be fine.   We also discussed lifting and other arm restrictions.  [name] voiced understanding.

## 2015-11-01 NOTE — Telephone Encounter (Signed)
Pt's daughter [name] returned your call-pls call at [phone]

## 2015-11-01 NOTE — Progress Notes (Signed)
Wound check appointment to evaluate hematoma (see 5/5 discharge summary). Hematoma remains and is still slightly firm upon palpation. Ecchymosis has begun to turn green/yellow. Dr.Camnitz assessed wound and recommended that patient follow up on 5/12 for a wound recheck to potentially restart Eliquis.  Pacemaker check performed. Normal device function. Threshold, sensing, and impedances consistent with implant measurements. Device programmed at 3.5V for extra safety margin until 3 month visit. Histogram distribution appropriate for patient and level of activity. Persistent AF since implant--currently taking 324mg  ASA. No high ventricular rates noted. Patient educated about wound care, arm mobility, lifting restrictions. ROV on 5/12 @ 0830 for a wound recheck and CBC.

## 2015-11-02 DIAGNOSIS — I350 Nonrheumatic aortic (valve) stenosis: Secondary | ICD-10-CM | POA: Diagnosis not present

## 2015-11-02 DIAGNOSIS — R001 Bradycardia, unspecified: Secondary | ICD-10-CM | POA: Diagnosis not present

## 2015-11-02 DIAGNOSIS — Z950 Presence of cardiac pacemaker: Secondary | ICD-10-CM | POA: Diagnosis not present

## 2015-11-02 DIAGNOSIS — I4891 Unspecified atrial fibrillation: Secondary | ICD-10-CM | POA: Diagnosis not present

## 2015-11-02 DIAGNOSIS — I110 Hypertensive heart disease with heart failure: Secondary | ICD-10-CM | POA: Diagnosis not present

## 2015-11-02 DIAGNOSIS — Z48812 Encounter for surgical aftercare following surgery on the circulatory system: Secondary | ICD-10-CM | POA: Diagnosis not present

## 2015-11-02 NOTE — Progress Notes (Signed)
Cardiology Office Note    Date:  11/04/2015   ID:  Crystal Cooper, DOB June 30, 1926, MRN [id number]  PCP:  Stephens Shire, MD  Cardiologist:  Dr. Stanford Breed   Post hospital follow up- TAVR  History of Present Illness:  Crystal Cooper is a 82 y.o. female with past medical history of HTN, persistent atrial fib/flutter, chronic diastolic heart failure, and severe aortic stenosis who presents to clinic for post hospital follow after recent admission for TAVR.  She had progressive dyspnea over the past 6 months and echo on 08/12/2015 showed normal LV function with severe aortic stenosis with a mean gradient of 43 mmHg. She underwent cardiac catheterization on 09/26/2015 that showed nonobstructive CAD with again severe AS the mean gradient was measured at 20 mmHg and the AVA at 0.74 cm2. She was seen by cardiothoracic surgery and it was determined that her risk for an open surgical AVR was elevated. Decision was made to perform transcatheter aortic valve replacement. She was admitted for planned TAVR on 10/25/2015. Her hospital course was complicate by bradycardia requiring PPM on 10/26/2015.Following the pacemaker implantation she had a hematoma at the pacemaker site and Eliquis was held. Plan was to restart as an outpatient if CBC stable.  At that time if there is no visible hematoma at the pacemaker site, Eliquis can be restarted and 324mg  aspirin discontinued. She was weak and inpatient rehab was recommended but there were not beds. She refused SNF and went home.   Today she presents to clinic for follow up. No chest pain or SOB. Feeling better and better every day, especially in terms of bleeding. Some tenderness over the pacer hematoma. She sometimes get LE edema but usually well controlled. No orthopnea or PND. No dizziness or syncope. No blood in her stool or urine.    Past Medical History  Diagnosis Date  . HTN (hypertension)   . Asthma   . Glaucoma   . Hearing loss   . Prolapsing  mitral leaflet syndrome   . SVT (supraventricular tachycardia) (HCC)     S/P ablation of AVNRT in 2003  . Aortic stenosis   . Atrial fibrillation ([place])   . Heart murmur   . Shortness of breath dyspnea     with exertion  . CHF (congestive heart failure) ([place]) 11/2014  . Pneumonia   . Arthritis   . Anemia     years ago  . Family history of adverse reaction to anesthesia     2 daughters would have N/V    Past Surgical History  Procedure Laterality Date  . Appendectomy    . Bladder surgery    . Cardiac surgery    . Nasal sinus surgery    . Cardiac catheterization N/A 09/26/2015    Procedure: Right/Left Heart Cath and Coronary Angiography;  Surgeon: Burnell Blanks, MD;  Location: [facility];  Service: Cardiovascular;  Laterality: N/A;  . Eye surgery Bilateral     cataract surgery  . Ep implantable device N/A 10/26/2015    Procedure: Pacemaker Implant;  Surgeon: Will Meredith Leeds, MD;  Location: [facility];  Service: Cardiovascular;  Laterality: N/A;  . Transcatheter aortic valve replacement, transfemoral Right 10/25/2015    Procedure: TRANSCATHETER AORTIC VALVE REPLACEMENT, TRANSFEMORAL;  Surgeon: Burnell Blanks, MD;  Location: [facility];  Service: Open Heart Surgery;  Laterality: Right;  . Tee without cardioversion N/A 10/25/2015    Procedure: TRANSESOPHAGEAL ECHOCARDIOGRAM (TEE);  Surgeon: Burnell Blanks, MD;  Location:  [facility];  Service: Open Heart Surgery;  Laterality: N/A;    Current Medications: Outpatient Prescriptions Prior to Visit  Medication Sig Dispense Refill  . albuterol (PROVENTIL HFA;VENTOLIN HFA) 108 (90 BASE) MCG/ACT inhaler Inhale 2 puffs into the lungs every 4 (four) hours as needed for wheezing or shortness of breath.     [name] amLODipine (NORVASC) 2.5 MG tablet Take 1 tablet (2.5 mg total) by mouth daily. 30 tablet 0  . aspirin EC 325 MG EC tablet Take 1 tablet (325 mg total) by mouth daily. 30 tablet 0  . furosemide (LASIX) 40 MG  tablet Take 1.5 tablets (60 mg total) by mouth daily. 45 tablet 6   No facility-administered medications prior to visit.     Allergies:   Hctz   Social History   Social History  . Marital Status: Widowed    Spouse Name: N/A  . Number of Children: 3  . Years of Education: N/A   Social History Main Topics  . Smoking status: Never Smoker   . Smokeless tobacco: Never Used  . Alcohol Use: No  . Drug Use: No  . Sexual Activity: Not Asked   Other Topics Concern  . None   Social History Narrative     Family History:  The patient's family history includes Pneumonia in her father.   ROS:   Please see the history of present illness.    ROS All other systems reviewed and are negative.   PHYSICAL EXAM:   VS:  BP 122/70 mmHg  Pulse 68  Ht 5\' 2"  (1.575 m)  Wt 118 lb (53.524 kg)  BMI 21.58 kg/m2   GEN: Well nourished, well developed, in no acute distress HEENT: normal Neck: no JVD, carotid bruits, or masses Cardiac: RRR; no murmurs, rubs, or gallops, 1 + bilateral LE edema .[name] Swelling around pacemaker site but soft.  Respiratory:  clear to auscultation bilaterally, normal work of breathing GI: soft, nontender, nondistended, + BS MS: no deformity or atrophy Skin: warm and dry, no rash Neuro:  Alert and Oriented x 3, Strength and sensation are intact Psych: euthymic mood, full affect  Wt Readings from Last 3 Encounters:  11/04/15 118 lb (53.524 kg)  10/28/15 117 lb (53.071 kg)  10/21/15 112 lb 1.6 oz (50.848 kg)      Studies/Labs Reviewed:   EKG:  EKG is ordered today.  The ekg ordered today demonstrates V paced rhythm with underlying atrial fib  Recent Labs: 12/12/2014: B Natriuretic Peptide 277.0* 10/21/2015: ALT 19 10/26/2015: Magnesium 2.5* 10/28/2015: BUN 8; Creatinine, Ser 0.79; Hemoglobin 9.3*; Platelets 113*; Potassium 4.2; Sodium 134*   Lipid Panel No results found for: CHOL, TRIG, HDL, CHOLHDL, VLDL, LDLCALC, LDLDIRECT  Additional studies/ records that were  reviewed today include:  Transesophageal Echocardiography - Left ventricle: The cavity size was normal. Wall thickness was  normal. Systolic function was normal. The estimated ejection  fraction was in the range of 60% to 65%. Wall motion was normal;  there were no regional wall motion abnormalities. - Aortic valve: Bicuspid; severely thickened, severely calcified  leaflets. Cusp separation was severely reduced. There was severe  stenosis. There was mild regurgitation. - Mitral valve: There was mild regurgitation. - Left atrium: No evidence of thrombus in the atrial cavity or  appendage. No evidence of thrombus in the atrial cavity or  appendage. - Right atrium: No evidence of thrombus in the atrial cavity or  appendage. - Atrial septum: No defect or patent foramen ovale was identified. - Tricuspid  valve: There was moderate regurgitation. Impressions - This was an intraoperative TEE during a TAVR procedure.  Transthoracic Echocardiography Post TAVR 10/26/15 - Left ventricle: The cavity size was normal. Wall thickness was  increased in a pattern of moderate LVH. Systolic function was  normal. The estimated ejection fraction was in the range of 60%  to 65%. - Aortic valve: Normal appearing 29 mm Sapien 3 valve with no  perivalvular regurgitation. Valve area (VTI): 1.21 cm^2. Valve  area (Vmax): 1.2 cm^2. Valve area (Vmean): 1.21 cm^2. - Mitral valve: There was mild to moderate regurgitation. - Left atrium: The atrium was mildly dilated. - Right atrium: The atrium was mildly dilated. - Atrial septum: No defect or patent foramen ovale was identified. - Tricuspid valve: There was moderate regurgitation.  Pacemaker Implant 10/26/15    CONCLUSIONS:  1. Successful implantation of a Medtronic Advisa DR MRI dual-chamber pacemaker for atrial fibrillation and junctional bradycardia 2. No early apparent complications.          ASSESSMENT:    1. S/P TAVR  (transcatheter aortic valve replacement)   2. S/P cardiac pacemaker procedure   3. Essential hypertension   4. Persistent atrial fibrillation ([place])   5. Chronic diastolic congestive heart failure (HCC)      PLAN:  In order of problems listed above:  Severe AS s/p TAVR (11/02/15): doing well. Will see Dr. Julianne Handler with echo in 1 month  Bradycardia s/p Medtronic Adapta PPM: wound check today. Hematoma has gone down but still quite prominant. Dr. Lovena Le came in to look at the patients pacemaker site. The plan is to restart eliquis 2.5mg  BID on 11/09/15 after two weeks to allow time to heal. If it gets worse between now and then, she will call us and not restart the Eliquis  HTN: BP well controlled on current regimen   Persistent atrial fib/flutter: Eliquis held due to PPM hematoma. If CBC stable, plan is to restart Eliquis 2.5mg  BID on 11/09/15. CHADSVASC score of at least 5 (CHF, HTN, age, F sex).   Chronic diastolic CHF: appears euvolemic. Continue lasix 60mg  daily.    Medication Adjustments/Labs and Tests Ordered: Current medicines are reviewed at length with the patient today.  Concerns regarding medicines are outlined above.  Medication changes, Labs and Tests ordered today are listed in the Patient Instructions below. Patient Instructions  Medication Instructions:  Your physician has recommended you make the following change in your medication:  1.  RESTART the Eliquis 2.5 mg taking 1 tablet twice a day on Nov 09, 2015  Labwork: TODAY:  CBC  Testing/Procedures:   Follow-Up: Your physician recommends that you schedule a follow-up appointment in: [facility]    Any Other Special Instructions Will Be Listed Below (If Applicable).   If you need a refill on your cardiac medications before your next appointment, please call your pharmacy.       Signed, Angelena Form, PA-C  11/04/2015 9:00 AM    [facility] [address],  [place], [place]  [address] Phone: [phone]; Fax: [phone]

## 2015-11-03 DIAGNOSIS — Z48812 Encounter for surgical aftercare following surgery on the circulatory system: Secondary | ICD-10-CM | POA: Diagnosis not present

## 2015-11-03 DIAGNOSIS — I350 Nonrheumatic aortic (valve) stenosis: Secondary | ICD-10-CM | POA: Diagnosis not present

## 2015-11-03 DIAGNOSIS — R001 Bradycardia, unspecified: Secondary | ICD-10-CM | POA: Diagnosis not present

## 2015-11-03 DIAGNOSIS — I110 Hypertensive heart disease with heart failure: Secondary | ICD-10-CM | POA: Diagnosis not present

## 2015-11-03 DIAGNOSIS — Z950 Presence of cardiac pacemaker: Secondary | ICD-10-CM | POA: Diagnosis not present

## 2015-11-03 DIAGNOSIS — I4891 Unspecified atrial fibrillation: Secondary | ICD-10-CM | POA: Diagnosis not present

## 2015-11-04 ENCOUNTER — Other Ambulatory Visit (INDEPENDENT_AMBULATORY_CARE_PROVIDER_SITE_OTHER): Payer: Medicare Other | Admitting: *Deleted

## 2015-11-04 ENCOUNTER — Ambulatory Visit (INDEPENDENT_AMBULATORY_CARE_PROVIDER_SITE_OTHER): Payer: Self-pay | Admitting: *Deleted

## 2015-11-04 ENCOUNTER — Ambulatory Visit (INDEPENDENT_AMBULATORY_CARE_PROVIDER_SITE_OTHER): Payer: Medicare Other | Admitting: Physician Assistant

## 2015-11-04 ENCOUNTER — Encounter: Payer: Self-pay | Admitting: Physician Assistant

## 2015-11-04 VITALS — BP 122/70 | HR 68 | Ht 62.0 in | Wt 118.0 lb

## 2015-11-04 DIAGNOSIS — I5032 Chronic diastolic (congestive) heart failure: Secondary | ICD-10-CM

## 2015-11-04 DIAGNOSIS — I481 Persistent atrial fibrillation: Secondary | ICD-10-CM | POA: Diagnosis not present

## 2015-11-04 DIAGNOSIS — T148 Other injury of unspecified body region: Secondary | ICD-10-CM

## 2015-11-04 DIAGNOSIS — T148XXA Other injury of unspecified body region, initial encounter: Secondary | ICD-10-CM

## 2015-11-04 DIAGNOSIS — Z954 Presence of other heart-valve replacement: Secondary | ICD-10-CM | POA: Diagnosis not present

## 2015-11-04 DIAGNOSIS — I10 Essential (primary) hypertension: Secondary | ICD-10-CM | POA: Diagnosis not present

## 2015-11-04 DIAGNOSIS — Z950 Presence of cardiac pacemaker: Secondary | ICD-10-CM | POA: Diagnosis not present

## 2015-11-04 DIAGNOSIS — I4819 Other persistent atrial fibrillation: Secondary | ICD-10-CM

## 2015-11-04 DIAGNOSIS — Z952 Presence of prosthetic heart valve: Secondary | ICD-10-CM

## 2015-11-04 LAB — CBC
HEMATOCRIT: 28.5 % — AB (ref 35.0–45.0)
Hemoglobin: 9.6 g/dL — ABNORMAL LOW (ref 11.7–15.5)
MCH: 29.7 pg (ref 27.0–33.0)
MCHC: 33.7 g/dL (ref 32.0–36.0)
MCV: 88.2 fL (ref 80.0–100.0)
MPV: 8.4 fL (ref 7.5–12.5)
Platelets: 246 10*3/uL (ref 140–400)
RBC: 3.23 MIL/uL — ABNORMAL LOW (ref 3.80–5.10)
RDW: 15.7 % — AB (ref 11.0–15.0)
WBC: 5.5 10*3/uL (ref 3.8–10.8)

## 2015-11-04 MED ORDER — APIXABAN 2.5 MG PO TABS: 2.5000 mg | ORAL_TABLET | Freq: Two times a day (BID) | ORAL | Status: DC

## 2015-11-04 NOTE — Progress Notes (Signed)
Wound reassessed for hematoma s/p ppm implant. Ecchymosis is now green/yellow over device area and hematoma is soft upon palpation. Dr.Taylor saw patient with Angelena Form, PA and recommended that pt restart Eliquis on 5/17. Patient will follow up with Dr.Camnitz in 3 months as scheduled.

## 2015-11-04 NOTE — Patient Instructions (Addendum)
Medication Instructions:  Your physician has recommended you make the following change in your medication:  1.  RESTART the Eliquis 2.5 mg taking 1 tablet twice a day on Nov 09, 2015  Labwork: TODAY:  CBC  Testing/Procedures:   Follow-Up: Your physician recommends that you schedule a follow-up appointment in: [place]    Any Other Special Instructions Will Be Listed Below (If Applicable).   If you need a refill on your cardiac medications before your next appointment, please call your pharmacy.

## 2015-11-04 NOTE — Addendum Note (Signed)
Addended by: Eulis Foster on: 11/04/2015 08:18 AM   Modules accepted: Orders

## 2015-11-05 DIAGNOSIS — I4891 Unspecified atrial fibrillation: Secondary | ICD-10-CM | POA: Diagnosis not present

## 2015-11-05 DIAGNOSIS — R001 Bradycardia, unspecified: Secondary | ICD-10-CM | POA: Diagnosis not present

## 2015-11-05 DIAGNOSIS — I350 Nonrheumatic aortic (valve) stenosis: Secondary | ICD-10-CM | POA: Diagnosis not present

## 2015-11-05 DIAGNOSIS — I110 Hypertensive heart disease with heart failure: Secondary | ICD-10-CM | POA: Diagnosis not present

## 2015-11-05 DIAGNOSIS — Z950 Presence of cardiac pacemaker: Secondary | ICD-10-CM | POA: Diagnosis not present

## 2015-11-05 DIAGNOSIS — Z48812 Encounter for surgical aftercare following surgery on the circulatory system: Secondary | ICD-10-CM | POA: Diagnosis not present

## 2015-11-07 ENCOUNTER — Ambulatory Visit: Payer: Medicare Other

## 2015-11-07 ENCOUNTER — Telehealth: Payer: Self-pay | Admitting: Cardiology

## 2015-11-07 DIAGNOSIS — I4891 Unspecified atrial fibrillation: Secondary | ICD-10-CM | POA: Diagnosis not present

## 2015-11-07 DIAGNOSIS — I350 Nonrheumatic aortic (valve) stenosis: Secondary | ICD-10-CM | POA: Diagnosis not present

## 2015-11-07 DIAGNOSIS — Z950 Presence of cardiac pacemaker: Secondary | ICD-10-CM | POA: Diagnosis not present

## 2015-11-07 DIAGNOSIS — Z48812 Encounter for surgical aftercare following surgery on the circulatory system: Secondary | ICD-10-CM | POA: Diagnosis not present

## 2015-11-07 DIAGNOSIS — I110 Hypertensive heart disease with heart failure: Secondary | ICD-10-CM | POA: Diagnosis not present

## 2015-11-07 DIAGNOSIS — R001 Bradycardia, unspecified: Secondary | ICD-10-CM | POA: Diagnosis not present

## 2015-11-07 NOTE — Telephone Encounter (Signed)
Called patient back and informed her she should be fine sitting under the hair dryer. Informed patient did not call her daughter back because she was not on the [facility]. Patient stated she was not having her hair done any time soon. Patient verbalized understanding

## 2015-11-07 NOTE — Telephone Encounter (Signed)
Crystal Cooper(Daughter) is calling because  MRs. Crystal Cooper wants to know now that she has a Psychologist, forensic can she sit up under the hair dryer? Please call   Thanks

## 2015-11-08 DIAGNOSIS — I350 Nonrheumatic aortic (valve) stenosis: Secondary | ICD-10-CM | POA: Diagnosis not present

## 2015-11-08 DIAGNOSIS — Z48812 Encounter for surgical aftercare following surgery on the circulatory system: Secondary | ICD-10-CM | POA: Diagnosis not present

## 2015-11-08 DIAGNOSIS — Z950 Presence of cardiac pacemaker: Secondary | ICD-10-CM | POA: Diagnosis not present

## 2015-11-08 DIAGNOSIS — R001 Bradycardia, unspecified: Secondary | ICD-10-CM | POA: Diagnosis not present

## 2015-11-08 DIAGNOSIS — I110 Hypertensive heart disease with heart failure: Secondary | ICD-10-CM | POA: Diagnosis not present

## 2015-11-08 DIAGNOSIS — I4891 Unspecified atrial fibrillation: Secondary | ICD-10-CM | POA: Diagnosis not present

## 2015-11-09 ENCOUNTER — Telehealth: Payer: Self-pay | Admitting: *Deleted

## 2015-11-09 ENCOUNTER — Ambulatory Visit (INDEPENDENT_AMBULATORY_CARE_PROVIDER_SITE_OTHER): Payer: Self-pay | Admitting: *Deleted

## 2015-11-09 DIAGNOSIS — I350 Nonrheumatic aortic (valve) stenosis: Secondary | ICD-10-CM | POA: Diagnosis not present

## 2015-11-09 DIAGNOSIS — T148XXA Other injury of unspecified body region, initial encounter: Secondary | ICD-10-CM

## 2015-11-09 DIAGNOSIS — Z950 Presence of cardiac pacemaker: Secondary | ICD-10-CM | POA: Diagnosis not present

## 2015-11-09 DIAGNOSIS — I4891 Unspecified atrial fibrillation: Secondary | ICD-10-CM | POA: Diagnosis not present

## 2015-11-09 DIAGNOSIS — I110 Hypertensive heart disease with heart failure: Secondary | ICD-10-CM | POA: Diagnosis not present

## 2015-11-09 DIAGNOSIS — R001 Bradycardia, unspecified: Secondary | ICD-10-CM | POA: Diagnosis not present

## 2015-11-09 DIAGNOSIS — T148 Other injury of unspecified body region: Secondary | ICD-10-CM

## 2015-11-09 DIAGNOSIS — Z48812 Encounter for surgical aftercare following surgery on the circulatory system: Secondary | ICD-10-CM | POA: Diagnosis not present

## 2015-11-09 NOTE — Progress Notes (Signed)
Patient presents to the office for a wound recheck s/p ppm implant on 5/3. Steri strips removed. Incision edges approximated. Wound without redness. Hematoma remains, but is soft upon palpation. Ecchymosis around device pocket is beginning to turn yellow.  I informed patient that she should restart Eliquis as Dr.Taylor recommended, but I strongly urged her to call if she notices that the site getting larger. Patient/daughter voiced understanding.  Patient education also completed regarding wound care, arm mobility, and lifting restrictions.

## 2015-11-09 NOTE — Telephone Encounter (Signed)
Recent PPM insertion on 5/3.  Wound check appt 5/12 w/ hematoma noted. HHRN calling in today to report patient still has hematoma in place and was supposed to restart her Elquis today. Advised to hold Eliquis until device clinic speaks with patient to determine if safe to restart. Forwarding to device clinic to call patient to discuss.

## 2015-11-09 NOTE — Telephone Encounter (Signed)
Spoke to patient regarding hematoma. Patient states that she believes that it has gotten a lot smaller since she was in the office last. Patient states that the ecchymosis is now yellow. Patient states that she did take one dose of her Eliquis this am, but was advised by her home health nurse not to take anymore until she spoke with our office. Appt was made for today at 1530 for a wound recheck/steri strip removal. Patient voiced understanding.

## 2015-11-10 DIAGNOSIS — I350 Nonrheumatic aortic (valve) stenosis: Secondary | ICD-10-CM | POA: Diagnosis not present

## 2015-11-10 DIAGNOSIS — I4891 Unspecified atrial fibrillation: Secondary | ICD-10-CM | POA: Diagnosis not present

## 2015-11-10 DIAGNOSIS — R001 Bradycardia, unspecified: Secondary | ICD-10-CM | POA: Diagnosis not present

## 2015-11-10 DIAGNOSIS — Z950 Presence of cardiac pacemaker: Secondary | ICD-10-CM | POA: Diagnosis not present

## 2015-11-10 DIAGNOSIS — I110 Hypertensive heart disease with heart failure: Secondary | ICD-10-CM | POA: Diagnosis not present

## 2015-11-10 DIAGNOSIS — Z48812 Encounter for surgical aftercare following surgery on the circulatory system: Secondary | ICD-10-CM | POA: Diagnosis not present

## 2015-11-11 DIAGNOSIS — I110 Hypertensive heart disease with heart failure: Secondary | ICD-10-CM | POA: Diagnosis not present

## 2015-11-11 DIAGNOSIS — I4891 Unspecified atrial fibrillation: Secondary | ICD-10-CM | POA: Diagnosis not present

## 2015-11-11 DIAGNOSIS — Z48812 Encounter for surgical aftercare following surgery on the circulatory system: Secondary | ICD-10-CM | POA: Diagnosis not present

## 2015-11-11 DIAGNOSIS — R001 Bradycardia, unspecified: Secondary | ICD-10-CM | POA: Diagnosis not present

## 2015-11-11 DIAGNOSIS — Z950 Presence of cardiac pacemaker: Secondary | ICD-10-CM | POA: Diagnosis not present

## 2015-11-11 DIAGNOSIS — I350 Nonrheumatic aortic (valve) stenosis: Secondary | ICD-10-CM | POA: Diagnosis not present

## 2015-11-14 DIAGNOSIS — I110 Hypertensive heart disease with heart failure: Secondary | ICD-10-CM | POA: Diagnosis not present

## 2015-11-14 DIAGNOSIS — I350 Nonrheumatic aortic (valve) stenosis: Secondary | ICD-10-CM | POA: Diagnosis not present

## 2015-11-14 DIAGNOSIS — Z950 Presence of cardiac pacemaker: Secondary | ICD-10-CM | POA: Diagnosis not present

## 2015-11-14 DIAGNOSIS — R001 Bradycardia, unspecified: Secondary | ICD-10-CM | POA: Diagnosis not present

## 2015-11-14 DIAGNOSIS — Z48812 Encounter for surgical aftercare following surgery on the circulatory system: Secondary | ICD-10-CM | POA: Diagnosis not present

## 2015-11-14 DIAGNOSIS — I4891 Unspecified atrial fibrillation: Secondary | ICD-10-CM | POA: Diagnosis not present

## 2015-11-15 DIAGNOSIS — R001 Bradycardia, unspecified: Secondary | ICD-10-CM | POA: Diagnosis not present

## 2015-11-15 DIAGNOSIS — Z48812 Encounter for surgical aftercare following surgery on the circulatory system: Secondary | ICD-10-CM | POA: Diagnosis not present

## 2015-11-15 DIAGNOSIS — I4891 Unspecified atrial fibrillation: Secondary | ICD-10-CM | POA: Diagnosis not present

## 2015-11-15 DIAGNOSIS — Z950 Presence of cardiac pacemaker: Secondary | ICD-10-CM | POA: Diagnosis not present

## 2015-11-15 DIAGNOSIS — I110 Hypertensive heart disease with heart failure: Secondary | ICD-10-CM | POA: Diagnosis not present

## 2015-11-15 DIAGNOSIS — I350 Nonrheumatic aortic (valve) stenosis: Secondary | ICD-10-CM | POA: Diagnosis not present

## 2015-11-16 DIAGNOSIS — Z48812 Encounter for surgical aftercare following surgery on the circulatory system: Secondary | ICD-10-CM | POA: Diagnosis not present

## 2015-11-16 DIAGNOSIS — I110 Hypertensive heart disease with heart failure: Secondary | ICD-10-CM | POA: Diagnosis not present

## 2015-11-16 DIAGNOSIS — I350 Nonrheumatic aortic (valve) stenosis: Secondary | ICD-10-CM | POA: Diagnosis not present

## 2015-11-16 DIAGNOSIS — R001 Bradycardia, unspecified: Secondary | ICD-10-CM | POA: Diagnosis not present

## 2015-11-16 DIAGNOSIS — I4891 Unspecified atrial fibrillation: Secondary | ICD-10-CM | POA: Diagnosis not present

## 2015-11-16 DIAGNOSIS — Z950 Presence of cardiac pacemaker: Secondary | ICD-10-CM | POA: Diagnosis not present

## 2015-11-18 DIAGNOSIS — Z48812 Encounter for surgical aftercare following surgery on the circulatory system: Secondary | ICD-10-CM | POA: Diagnosis not present

## 2015-11-18 DIAGNOSIS — I350 Nonrheumatic aortic (valve) stenosis: Secondary | ICD-10-CM | POA: Diagnosis not present

## 2015-11-18 DIAGNOSIS — I110 Hypertensive heart disease with heart failure: Secondary | ICD-10-CM | POA: Diagnosis not present

## 2015-11-18 DIAGNOSIS — I4891 Unspecified atrial fibrillation: Secondary | ICD-10-CM | POA: Diagnosis not present

## 2015-11-18 DIAGNOSIS — Z950 Presence of cardiac pacemaker: Secondary | ICD-10-CM | POA: Diagnosis not present

## 2015-11-18 DIAGNOSIS — R001 Bradycardia, unspecified: Secondary | ICD-10-CM | POA: Diagnosis not present

## 2015-11-21 DIAGNOSIS — R001 Bradycardia, unspecified: Secondary | ICD-10-CM | POA: Diagnosis not present

## 2015-11-21 DIAGNOSIS — I4891 Unspecified atrial fibrillation: Secondary | ICD-10-CM | POA: Diagnosis not present

## 2015-11-21 DIAGNOSIS — Z950 Presence of cardiac pacemaker: Secondary | ICD-10-CM | POA: Diagnosis not present

## 2015-11-21 DIAGNOSIS — I350 Nonrheumatic aortic (valve) stenosis: Secondary | ICD-10-CM | POA: Diagnosis not present

## 2015-11-21 DIAGNOSIS — I110 Hypertensive heart disease with heart failure: Secondary | ICD-10-CM | POA: Diagnosis not present

## 2015-11-21 DIAGNOSIS — Z48812 Encounter for surgical aftercare following surgery on the circulatory system: Secondary | ICD-10-CM | POA: Diagnosis not present

## 2015-11-22 DIAGNOSIS — I350 Nonrheumatic aortic (valve) stenosis: Secondary | ICD-10-CM | POA: Diagnosis not present

## 2015-11-22 DIAGNOSIS — Z950 Presence of cardiac pacemaker: Secondary | ICD-10-CM | POA: Diagnosis not present

## 2015-11-22 DIAGNOSIS — R001 Bradycardia, unspecified: Secondary | ICD-10-CM | POA: Diagnosis not present

## 2015-11-22 DIAGNOSIS — I110 Hypertensive heart disease with heart failure: Secondary | ICD-10-CM | POA: Diagnosis not present

## 2015-11-22 DIAGNOSIS — Z48812 Encounter for surgical aftercare following surgery on the circulatory system: Secondary | ICD-10-CM | POA: Diagnosis not present

## 2015-11-22 DIAGNOSIS — I4891 Unspecified atrial fibrillation: Secondary | ICD-10-CM | POA: Diagnosis not present

## 2015-11-24 DIAGNOSIS — Z950 Presence of cardiac pacemaker: Secondary | ICD-10-CM | POA: Diagnosis not present

## 2015-11-24 DIAGNOSIS — I4891 Unspecified atrial fibrillation: Secondary | ICD-10-CM | POA: Diagnosis not present

## 2015-11-24 DIAGNOSIS — R001 Bradycardia, unspecified: Secondary | ICD-10-CM | POA: Diagnosis not present

## 2015-11-24 DIAGNOSIS — I110 Hypertensive heart disease with heart failure: Secondary | ICD-10-CM | POA: Diagnosis not present

## 2015-11-24 DIAGNOSIS — Z48812 Encounter for surgical aftercare following surgery on the circulatory system: Secondary | ICD-10-CM | POA: Diagnosis not present

## 2015-11-24 DIAGNOSIS — I350 Nonrheumatic aortic (valve) stenosis: Secondary | ICD-10-CM | POA: Diagnosis not present

## 2015-11-25 DIAGNOSIS — I350 Nonrheumatic aortic (valve) stenosis: Secondary | ICD-10-CM | POA: Diagnosis not present

## 2015-11-25 DIAGNOSIS — Z950 Presence of cardiac pacemaker: Secondary | ICD-10-CM | POA: Diagnosis not present

## 2015-11-25 DIAGNOSIS — Z48812 Encounter for surgical aftercare following surgery on the circulatory system: Secondary | ICD-10-CM | POA: Diagnosis not present

## 2015-11-25 DIAGNOSIS — I4891 Unspecified atrial fibrillation: Secondary | ICD-10-CM | POA: Diagnosis not present

## 2015-11-25 DIAGNOSIS — R001 Bradycardia, unspecified: Secondary | ICD-10-CM | POA: Diagnosis not present

## 2015-11-25 DIAGNOSIS — I110 Hypertensive heart disease with heart failure: Secondary | ICD-10-CM | POA: Diagnosis not present

## 2015-11-27 NOTE — Progress Notes (Signed)
Valve Clinic Note  Referring: [place]  Chief Complaint  Patient presents with  . Follow-up    History of Present Illness: 82 yo female with history of severe aortic valve stenosis, HTN, persistent atrial fibrillation on Eliquis, asthma, SVT, chronic diastolic CHF here today for one month TAVR follow up. She underwent TAVR on 10/25/15 with a 29 mm Edwards Sapien 3 bioprosthetic valve from the transfemoral approach. She had persistent bradycardia following the procedure with baseline atrial fib. A permanent pacemaker was implanted 10/26/15 by Dr. Lennie Odor.  Eliquis was held due to pacer pocket hematoma but was restarted mid May.   She is here today for follow up. She is feeling great. The Pacer pocket hematoma is resolving but still sore. NO dyspnea. No chest pain with exertion. No LE edema. No dizziness.   Primary Care Physician: Stephens Shire, MD  Primary Cardiology: Stanford Breed   Past Medical History  Diagnosis Date  . HTN (hypertension)   . Asthma   . Glaucoma   . Hearing loss   . Prolapsing mitral leaflet syndrome   . SVT (supraventricular tachycardia) (HCC)     S/P ablation of AVNRT in 2003  . Aortic stenosis   . Atrial fibrillation ([place])   . Heart murmur   . Shortness of breath dyspnea     with exertion  . CHF (congestive heart failure) ([place]) 11/2014  . Pneumonia   . Arthritis   . Anemia     years ago  . Family history of adverse reaction to anesthesia     2 daughters would have N/V    Past Surgical History  Procedure Laterality Date  . Appendectomy    . Bladder surgery    . Cardiac surgery    . Nasal sinus surgery    . Cardiac catheterization N/A 09/26/2015    Procedure: Right/Left Heart Cath and Coronary Angiography;  Surgeon: Burnell Blanks, MD;  Location: [facility];  Service: Cardiovascular;  Laterality: N/A;  . Eye surgery Bilateral     cataract surgery  . Ep implantable device N/A 10/26/2015    Procedure: Pacemaker Implant;  Surgeon: Will Meredith Leeds, MD;  Location: [facility];  Service: Cardiovascular;  Laterality: N/A;  . Transcatheter aortic valve replacement, transfemoral Right 10/25/2015    Procedure: TRANSCATHETER AORTIC VALVE REPLACEMENT, TRANSFEMORAL;  Surgeon: Burnell Blanks, MD;  Location: [facility];  Service: Open Heart Surgery;  Laterality: Right;  . Tee without cardioversion N/A 10/25/2015    Procedure: TRANSESOPHAGEAL ECHOCARDIOGRAM (TEE);  Surgeon: Burnell Blanks, MD;  Location: [facility];  Service: Open Heart Surgery;  Laterality: N/A;    Current Outpatient Prescriptions  Medication Sig Dispense Refill  . albuterol (PROVENTIL HFA;VENTOLIN HFA) 108 (90 BASE) MCG/ACT inhaler Inhale 2 puffs into the lungs every 4 (four) hours as needed for wheezing or shortness of breath.     [name] amLODipine (NORVASC) 2.5 MG tablet Take 1 tablet (2.5 mg total) by mouth daily. 30 tablet 0  . apixaban (ELIQUIS) 2.5 MG TABS tablet Take 1 tablet (2.5 mg total) by mouth 2 (two) times daily. 180 tablet 3  . furosemide (LASIX) 40 MG tablet Take 1.5 tablets (60 mg total) by mouth daily. 45 tablet 6   No current facility-administered medications for this visit.    Allergies  Allergen Reactions  . Hctz [Hydrochlorothiazide] Other (See Comments)    Pt was ill and affected kidneys     Social History   Social History  . Marital Status: Widowed  Spouse Name: N/A  . Number of Children: 3  . Years of Education: N/A   Occupational History  . Not on file.   Social History Main Topics  . Smoking status: Never Smoker   . Smokeless tobacco: Never Used  . Alcohol Use: No  . Drug Use: No  . Sexual Activity: Not on file   Other Topics Concern  . Not on file   Social History Narrative    Family History  Problem Relation Age of Onset  . Pneumonia Father   . Heart attack Neg Hx   . Hypertension Mother     Review of Systems:  As stated in the HPI and otherwise negative.   BP 140/60 mmHg  Pulse 67  Ht 5\' 2"  (1.575 m)   Wt 112 lb 12.8 oz (51.166 kg)  BMI 20.63 kg/m2  Physical Examination: General: Well developed, well nourished, NAD HEENT: OP clear, mucus membranes moist SKIN: warm, dry. No rashes. Neuro: No focal deficits Musculoskeletal: Muscle strength 5/5 all ext Psychiatric: Mood and affect normal Neck: No JVD, no carotid bruits, no thyromegaly, no lymphadenopathy. Lungs:Clear bilaterally, no wheezes, rhonci, crackles Cardiovascular: Irreg irreg with loud harsh systolic murmur. No gallops or rubs. Abdomen:Soft. Bowel sounds present. Non-tender.  Extremities: No lower extremity edema. Pulses are 2 + in the bilateral DP/PT.  Echo 11/28/15:  Full report pending. Preliminary read with normal LV function. Bioprosthetic aortic valve working well.   EKG:  EKG is not ordered today   Recent Labs: 12/12/2014: B Natriuretic Peptide 277.0* 10/21/2015: ALT 19 10/26/2015: Magnesium 2.5* 10/28/2015: BUN 8; Creatinine, Ser 0.79; Potassium 4.2; Sodium 134* 11/04/2015: Hemoglobin 9.6*; Platelets 246   Lipid Panel No results found for: CHOL, TRIG, HDL, CHOLHDL, VLDL, LDLCALC, LDLDIRECT   Wt Readings from Last 3 Encounters:  11/28/15 112 lb 12.8 oz (51.166 kg)  11/04/15 118 lb (53.524 kg)  10/28/15 117 lb (53.071 kg)    Other studies Reviewed: Additional studies/ records that were reviewed today include: Echo images are personally reviewed by me. Review of the above records demonstrates:   Assessment and Plan:   1. Severe aortic valve stenosis: Now s/p TAVR and doing well. She is back on Eliquis. Pacer pocket hematoma is resolving. She is NYHA class 1. Echo today shows normally functioning bioprosthetic aortic valve. Normal LV function. She will need one year TAVR follow up with me. Continue Eliquis.   2. Atrial fibrillation: Rate controlled. She is on Eliquis.   3. Bradycardia: Permanent pacemaker in place. She is due to see Dr. Lennie Odor in 2 months.   Current medicines are reviewed at length with the  patient today.  The patient does not have concerns regarding medicines.  The following changes have been made:  no change  Labs/ tests ordered today include:  No orders of the defined types were placed in this encounter.    Disposition:   FU with TAVR CT surgeon after cath.    Signed, Lauree Chandler, MD 11/28/2015 11:57 AM    [facility] [place], [place], [place]  [address] Phone: [phone]; Fax: [phone]

## 2015-11-28 ENCOUNTER — Ambulatory Visit (HOSPITAL_COMMUNITY): Payer: Medicare Other | Attending: Cardiology

## 2015-11-28 ENCOUNTER — Ambulatory Visit (INDEPENDENT_AMBULATORY_CARE_PROVIDER_SITE_OTHER): Payer: Medicare Other | Admitting: Cardiovascular Disease

## 2015-11-28 ENCOUNTER — Encounter: Payer: Self-pay | Admitting: Cardiovascular Disease

## 2015-11-28 ENCOUNTER — Other Ambulatory Visit: Payer: Self-pay

## 2015-11-28 VITALS — BP 140/60 | HR 67 | Ht 62.0 in | Wt 112.8 lb

## 2015-11-28 DIAGNOSIS — I272 Other secondary pulmonary hypertension: Secondary | ICD-10-CM | POA: Insufficient documentation

## 2015-11-28 DIAGNOSIS — I509 Heart failure, unspecified: Secondary | ICD-10-CM | POA: Diagnosis not present

## 2015-11-28 DIAGNOSIS — I481 Persistent atrial fibrillation: Secondary | ICD-10-CM | POA: Diagnosis not present

## 2015-11-28 DIAGNOSIS — I350 Nonrheumatic aortic (valve) stenosis: Secondary | ICD-10-CM | POA: Diagnosis not present

## 2015-11-28 DIAGNOSIS — Z954 Presence of other heart-valve replacement: Secondary | ICD-10-CM

## 2015-11-28 DIAGNOSIS — I071 Rheumatic tricuspid insufficiency: Secondary | ICD-10-CM | POA: Diagnosis not present

## 2015-11-28 DIAGNOSIS — Z953 Presence of xenogenic heart valve: Secondary | ICD-10-CM | POA: Insufficient documentation

## 2015-11-28 DIAGNOSIS — I340 Nonrheumatic mitral (valve) insufficiency: Secondary | ICD-10-CM | POA: Diagnosis not present

## 2015-11-28 DIAGNOSIS — I517 Cardiomegaly: Secondary | ICD-10-CM | POA: Insufficient documentation

## 2015-11-28 DIAGNOSIS — I351 Nonrheumatic aortic (valve) insufficiency: Secondary | ICD-10-CM | POA: Diagnosis not present

## 2015-11-28 DIAGNOSIS — I4891 Unspecified atrial fibrillation: Secondary | ICD-10-CM | POA: Insufficient documentation

## 2015-11-28 DIAGNOSIS — R001 Bradycardia, unspecified: Secondary | ICD-10-CM | POA: Diagnosis not present

## 2015-11-28 DIAGNOSIS — I4819 Other persistent atrial fibrillation: Secondary | ICD-10-CM

## 2015-11-28 DIAGNOSIS — Z952 Presence of prosthetic heart valve: Secondary | ICD-10-CM

## 2015-11-28 NOTE — Patient Instructions (Signed)
Medication Instructions:  Your physician recommends that you continue on your current medications as directed. Please refer to the Current Medication list given to you today.   Labwork: none  Testing/Procedures: Your physician has requested that you have an echocardiogram. Echocardiography is a painless test that uses sound waves to create images of your heart. It provides your doctor with information about the size and shape of your heart and how well your heart's chambers and valves are working. This procedure takes approximately one hour. There are no restrictions for this procedure.  To be done in 11 months when you see Dr. Angelena Form     Follow-Up: Your physician wants you to follow-up in: 6 months with Dr. Stanford Breed and 11 months with Dr. Angelena Form. You will receive a reminder letter in the mail two months in advance. If you don't receive a letter, please call our office to schedule the follow-up appointment.    Any Other Special Instructions Will Be Listed Below (If Applicable).     If you need a refill on your cardiac medications before your next appointment, please call your pharmacy.

## 2015-11-29 DIAGNOSIS — Z48812 Encounter for surgical aftercare following surgery on the circulatory system: Secondary | ICD-10-CM | POA: Diagnosis not present

## 2015-11-29 DIAGNOSIS — R001 Bradycardia, unspecified: Secondary | ICD-10-CM | POA: Diagnosis not present

## 2015-11-29 DIAGNOSIS — I350 Nonrheumatic aortic (valve) stenosis: Secondary | ICD-10-CM | POA: Diagnosis not present

## 2015-11-29 DIAGNOSIS — I110 Hypertensive heart disease with heart failure: Secondary | ICD-10-CM | POA: Diagnosis not present

## 2015-11-29 DIAGNOSIS — Z950 Presence of cardiac pacemaker: Secondary | ICD-10-CM | POA: Diagnosis not present

## 2015-11-29 DIAGNOSIS — I4891 Unspecified atrial fibrillation: Secondary | ICD-10-CM | POA: Diagnosis not present

## 2015-11-30 DIAGNOSIS — I350 Nonrheumatic aortic (valve) stenosis: Secondary | ICD-10-CM | POA: Diagnosis not present

## 2015-11-30 DIAGNOSIS — Z950 Presence of cardiac pacemaker: Secondary | ICD-10-CM | POA: Diagnosis not present

## 2015-11-30 DIAGNOSIS — Z48812 Encounter for surgical aftercare following surgery on the circulatory system: Secondary | ICD-10-CM | POA: Diagnosis not present

## 2015-11-30 DIAGNOSIS — I4891 Unspecified atrial fibrillation: Secondary | ICD-10-CM | POA: Diagnosis not present

## 2015-11-30 DIAGNOSIS — R001 Bradycardia, unspecified: Secondary | ICD-10-CM | POA: Diagnosis not present

## 2015-11-30 DIAGNOSIS — I110 Hypertensive heart disease with heart failure: Secondary | ICD-10-CM | POA: Diagnosis not present

## 2015-12-01 DIAGNOSIS — I4891 Unspecified atrial fibrillation: Secondary | ICD-10-CM | POA: Diagnosis not present

## 2015-12-01 DIAGNOSIS — I350 Nonrheumatic aortic (valve) stenosis: Secondary | ICD-10-CM | POA: Diagnosis not present

## 2015-12-01 DIAGNOSIS — Z48812 Encounter for surgical aftercare following surgery on the circulatory system: Secondary | ICD-10-CM | POA: Diagnosis not present

## 2015-12-01 DIAGNOSIS — I110 Hypertensive heart disease with heart failure: Secondary | ICD-10-CM | POA: Diagnosis not present

## 2015-12-01 DIAGNOSIS — Z950 Presence of cardiac pacemaker: Secondary | ICD-10-CM | POA: Diagnosis not present

## 2015-12-01 DIAGNOSIS — R001 Bradycardia, unspecified: Secondary | ICD-10-CM | POA: Diagnosis not present

## 2015-12-02 DIAGNOSIS — Z950 Presence of cardiac pacemaker: Secondary | ICD-10-CM | POA: Diagnosis not present

## 2015-12-02 DIAGNOSIS — I110 Hypertensive heart disease with heart failure: Secondary | ICD-10-CM | POA: Diagnosis not present

## 2015-12-02 DIAGNOSIS — I350 Nonrheumatic aortic (valve) stenosis: Secondary | ICD-10-CM | POA: Diagnosis not present

## 2015-12-02 DIAGNOSIS — I4891 Unspecified atrial fibrillation: Secondary | ICD-10-CM | POA: Diagnosis not present

## 2015-12-02 DIAGNOSIS — R001 Bradycardia, unspecified: Secondary | ICD-10-CM | POA: Diagnosis not present

## 2015-12-02 DIAGNOSIS — Z48812 Encounter for surgical aftercare following surgery on the circulatory system: Secondary | ICD-10-CM | POA: Diagnosis not present

## 2015-12-05 DIAGNOSIS — I4891 Unspecified atrial fibrillation: Secondary | ICD-10-CM | POA: Diagnosis not present

## 2015-12-05 DIAGNOSIS — I350 Nonrheumatic aortic (valve) stenosis: Secondary | ICD-10-CM | POA: Diagnosis not present

## 2015-12-05 DIAGNOSIS — R001 Bradycardia, unspecified: Secondary | ICD-10-CM | POA: Diagnosis not present

## 2015-12-05 DIAGNOSIS — Z950 Presence of cardiac pacemaker: Secondary | ICD-10-CM | POA: Diagnosis not present

## 2015-12-05 DIAGNOSIS — I110 Hypertensive heart disease with heart failure: Secondary | ICD-10-CM | POA: Diagnosis not present

## 2015-12-05 DIAGNOSIS — Z48812 Encounter for surgical aftercare following surgery on the circulatory system: Secondary | ICD-10-CM | POA: Diagnosis not present

## 2015-12-07 DIAGNOSIS — Z950 Presence of cardiac pacemaker: Secondary | ICD-10-CM | POA: Diagnosis not present

## 2015-12-07 DIAGNOSIS — R001 Bradycardia, unspecified: Secondary | ICD-10-CM | POA: Diagnosis not present

## 2015-12-07 DIAGNOSIS — I350 Nonrheumatic aortic (valve) stenosis: Secondary | ICD-10-CM | POA: Diagnosis not present

## 2015-12-07 DIAGNOSIS — I110 Hypertensive heart disease with heart failure: Secondary | ICD-10-CM | POA: Diagnosis not present

## 2015-12-07 DIAGNOSIS — I4891 Unspecified atrial fibrillation: Secondary | ICD-10-CM | POA: Diagnosis not present

## 2015-12-07 DIAGNOSIS — Z48812 Encounter for surgical aftercare following surgery on the circulatory system: Secondary | ICD-10-CM | POA: Diagnosis not present

## 2015-12-09 DIAGNOSIS — Z48812 Encounter for surgical aftercare following surgery on the circulatory system: Secondary | ICD-10-CM | POA: Diagnosis not present

## 2015-12-09 DIAGNOSIS — I110 Hypertensive heart disease with heart failure: Secondary | ICD-10-CM | POA: Diagnosis not present

## 2015-12-09 DIAGNOSIS — Z950 Presence of cardiac pacemaker: Secondary | ICD-10-CM | POA: Diagnosis not present

## 2015-12-09 DIAGNOSIS — I350 Nonrheumatic aortic (valve) stenosis: Secondary | ICD-10-CM | POA: Diagnosis not present

## 2015-12-09 DIAGNOSIS — R001 Bradycardia, unspecified: Secondary | ICD-10-CM | POA: Diagnosis not present

## 2015-12-09 DIAGNOSIS — I4891 Unspecified atrial fibrillation: Secondary | ICD-10-CM | POA: Diagnosis not present

## 2015-12-12 DIAGNOSIS — I4891 Unspecified atrial fibrillation: Secondary | ICD-10-CM | POA: Diagnosis not present

## 2015-12-12 DIAGNOSIS — Z950 Presence of cardiac pacemaker: Secondary | ICD-10-CM | POA: Diagnosis not present

## 2015-12-12 DIAGNOSIS — I110 Hypertensive heart disease with heart failure: Secondary | ICD-10-CM | POA: Diagnosis not present

## 2015-12-12 DIAGNOSIS — Z48812 Encounter for surgical aftercare following surgery on the circulatory system: Secondary | ICD-10-CM | POA: Diagnosis not present

## 2015-12-12 DIAGNOSIS — R001 Bradycardia, unspecified: Secondary | ICD-10-CM | POA: Diagnosis not present

## 2015-12-12 DIAGNOSIS — I350 Nonrheumatic aortic (valve) stenosis: Secondary | ICD-10-CM | POA: Diagnosis not present

## 2015-12-13 DIAGNOSIS — I110 Hypertensive heart disease with heart failure: Secondary | ICD-10-CM | POA: Diagnosis not present

## 2015-12-13 DIAGNOSIS — I4891 Unspecified atrial fibrillation: Secondary | ICD-10-CM | POA: Diagnosis not present

## 2015-12-13 DIAGNOSIS — I350 Nonrheumatic aortic (valve) stenosis: Secondary | ICD-10-CM | POA: Diagnosis not present

## 2015-12-13 DIAGNOSIS — R001 Bradycardia, unspecified: Secondary | ICD-10-CM | POA: Diagnosis not present

## 2015-12-13 DIAGNOSIS — Z950 Presence of cardiac pacemaker: Secondary | ICD-10-CM | POA: Diagnosis not present

## 2015-12-13 DIAGNOSIS — Z48812 Encounter for surgical aftercare following surgery on the circulatory system: Secondary | ICD-10-CM | POA: Diagnosis not present

## 2015-12-16 DIAGNOSIS — Z950 Presence of cardiac pacemaker: Secondary | ICD-10-CM | POA: Diagnosis not present

## 2015-12-16 DIAGNOSIS — I4891 Unspecified atrial fibrillation: Secondary | ICD-10-CM | POA: Diagnosis not present

## 2015-12-16 DIAGNOSIS — I350 Nonrheumatic aortic (valve) stenosis: Secondary | ICD-10-CM | POA: Diagnosis not present

## 2015-12-16 DIAGNOSIS — R001 Bradycardia, unspecified: Secondary | ICD-10-CM | POA: Diagnosis not present

## 2015-12-16 DIAGNOSIS — Z48812 Encounter for surgical aftercare following surgery on the circulatory system: Secondary | ICD-10-CM | POA: Diagnosis not present

## 2015-12-16 DIAGNOSIS — I110 Hypertensive heart disease with heart failure: Secondary | ICD-10-CM | POA: Diagnosis not present

## 2015-12-19 DIAGNOSIS — R001 Bradycardia, unspecified: Secondary | ICD-10-CM | POA: Diagnosis not present

## 2015-12-19 DIAGNOSIS — I350 Nonrheumatic aortic (valve) stenosis: Secondary | ICD-10-CM | POA: Diagnosis not present

## 2015-12-19 DIAGNOSIS — Z950 Presence of cardiac pacemaker: Secondary | ICD-10-CM | POA: Diagnosis not present

## 2015-12-19 DIAGNOSIS — I110 Hypertensive heart disease with heart failure: Secondary | ICD-10-CM | POA: Diagnosis not present

## 2015-12-19 DIAGNOSIS — Z48812 Encounter for surgical aftercare following surgery on the circulatory system: Secondary | ICD-10-CM | POA: Diagnosis not present

## 2015-12-19 DIAGNOSIS — I4891 Unspecified atrial fibrillation: Secondary | ICD-10-CM | POA: Diagnosis not present

## 2015-12-21 DIAGNOSIS — R001 Bradycardia, unspecified: Secondary | ICD-10-CM | POA: Diagnosis not present

## 2015-12-21 DIAGNOSIS — I350 Nonrheumatic aortic (valve) stenosis: Secondary | ICD-10-CM | POA: Diagnosis not present

## 2015-12-21 DIAGNOSIS — Z48812 Encounter for surgical aftercare following surgery on the circulatory system: Secondary | ICD-10-CM | POA: Diagnosis not present

## 2015-12-21 DIAGNOSIS — I4891 Unspecified atrial fibrillation: Secondary | ICD-10-CM | POA: Diagnosis not present

## 2015-12-21 DIAGNOSIS — Z950 Presence of cardiac pacemaker: Secondary | ICD-10-CM | POA: Diagnosis not present

## 2015-12-21 DIAGNOSIS — I110 Hypertensive heart disease with heart failure: Secondary | ICD-10-CM | POA: Diagnosis not present

## 2015-12-23 DIAGNOSIS — I4891 Unspecified atrial fibrillation: Secondary | ICD-10-CM | POA: Diagnosis not present

## 2015-12-23 DIAGNOSIS — R001 Bradycardia, unspecified: Secondary | ICD-10-CM | POA: Diagnosis not present

## 2015-12-23 DIAGNOSIS — Z48812 Encounter for surgical aftercare following surgery on the circulatory system: Secondary | ICD-10-CM | POA: Diagnosis not present

## 2015-12-23 DIAGNOSIS — I350 Nonrheumatic aortic (valve) stenosis: Secondary | ICD-10-CM | POA: Diagnosis not present

## 2015-12-23 DIAGNOSIS — I110 Hypertensive heart disease with heart failure: Secondary | ICD-10-CM | POA: Diagnosis not present

## 2015-12-23 DIAGNOSIS — Z950 Presence of cardiac pacemaker: Secondary | ICD-10-CM | POA: Diagnosis not present

## 2015-12-26 DIAGNOSIS — R001 Bradycardia, unspecified: Secondary | ICD-10-CM | POA: Diagnosis not present

## 2015-12-26 DIAGNOSIS — Z48812 Encounter for surgical aftercare following surgery on the circulatory system: Secondary | ICD-10-CM | POA: Diagnosis not present

## 2015-12-26 DIAGNOSIS — I4891 Unspecified atrial fibrillation: Secondary | ICD-10-CM | POA: Diagnosis not present

## 2015-12-26 DIAGNOSIS — I350 Nonrheumatic aortic (valve) stenosis: Secondary | ICD-10-CM | POA: Diagnosis not present

## 2015-12-26 DIAGNOSIS — Z950 Presence of cardiac pacemaker: Secondary | ICD-10-CM | POA: Diagnosis not present

## 2015-12-26 DIAGNOSIS — I110 Hypertensive heart disease with heart failure: Secondary | ICD-10-CM | POA: Diagnosis not present

## 2015-12-29 DIAGNOSIS — I4891 Unspecified atrial fibrillation: Secondary | ICD-10-CM | POA: Diagnosis not present

## 2015-12-29 DIAGNOSIS — R001 Bradycardia, unspecified: Secondary | ICD-10-CM | POA: Diagnosis not present

## 2015-12-29 DIAGNOSIS — I350 Nonrheumatic aortic (valve) stenosis: Secondary | ICD-10-CM | POA: Diagnosis not present

## 2015-12-29 DIAGNOSIS — Z48812 Encounter for surgical aftercare following surgery on the circulatory system: Secondary | ICD-10-CM | POA: Diagnosis not present

## 2015-12-29 DIAGNOSIS — I110 Hypertensive heart disease with heart failure: Secondary | ICD-10-CM | POA: Diagnosis not present

## 2015-12-29 DIAGNOSIS — Z950 Presence of cardiac pacemaker: Secondary | ICD-10-CM | POA: Diagnosis not present

## 2016-01-09 DIAGNOSIS — H401131 Primary open-angle glaucoma, bilateral, mild stage: Secondary | ICD-10-CM | POA: Diagnosis not present

## 2016-01-11 DIAGNOSIS — H9190 Unspecified hearing loss, unspecified ear: Secondary | ICD-10-CM | POA: Insufficient documentation

## 2016-01-11 DIAGNOSIS — J45909 Unspecified asthma, uncomplicated: Secondary | ICD-10-CM | POA: Insufficient documentation

## 2016-01-11 DIAGNOSIS — I2510 Atherosclerotic heart disease of native coronary artery without angina pectoris: Secondary | ICD-10-CM | POA: Insufficient documentation

## 2016-01-11 DIAGNOSIS — I3489 Other nonrheumatic mitral valve disorders: Secondary | ICD-10-CM | POA: Insufficient documentation

## 2016-01-11 DIAGNOSIS — I348 Other nonrheumatic mitral valve disorders: Secondary | ICD-10-CM | POA: Insufficient documentation

## 2016-01-11 DIAGNOSIS — H409 Unspecified glaucoma: Secondary | ICD-10-CM | POA: Insufficient documentation

## 2016-01-16 DIAGNOSIS — H401131 Primary open-angle glaucoma, bilateral, mild stage: Secondary | ICD-10-CM | POA: Diagnosis not present

## 2016-01-17 DIAGNOSIS — J4520 Mild intermittent asthma, uncomplicated: Secondary | ICD-10-CM | POA: Diagnosis not present

## 2016-01-17 DIAGNOSIS — I509 Heart failure, unspecified: Secondary | ICD-10-CM | POA: Diagnosis not present

## 2016-01-17 DIAGNOSIS — I5022 Chronic systolic (congestive) heart failure: Secondary | ICD-10-CM | POA: Diagnosis not present

## 2016-01-17 DIAGNOSIS — I350 Nonrheumatic aortic (valve) stenosis: Secondary | ICD-10-CM | POA: Diagnosis not present

## 2016-01-17 DIAGNOSIS — I4891 Unspecified atrial fibrillation: Secondary | ICD-10-CM | POA: Diagnosis not present

## 2016-01-17 DIAGNOSIS — I10 Essential (primary) hypertension: Secondary | ICD-10-CM | POA: Diagnosis not present

## 2016-01-17 DIAGNOSIS — I482 Chronic atrial fibrillation: Secondary | ICD-10-CM | POA: Diagnosis not present

## 2016-02-08 NOTE — Progress Notes (Signed)
 Electrophysiology Office Note   Date:  02/08/2016   ID:  Crystal Cooper, DOB 04/09/1927, MRN [id number]  PCP:  Cooper,Crystal A, MD  Cardiologist:  Crenshaw, McAlhany Primary Electrophysiologist:  Ron Beske Martin Ranada Vigorito, MD    No chief complaint on file.    History of Present Illness: Crystal Cooper is a 82 y.o. female who presents today for electrophysiology evaluation.   Hx severe aortic valve stenosis, HTN, persistent atrial fibrillation on Eliquis, asthma, SVT, chronic diastolic CHF.  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  Dual chamber pacemaker implanted complicated by pocket hematoma.   Today, she denies symptoms of palpitations, chest pain, shortness of breath, orthopnea, PND, lower extremity edema, claudication, dizziness, presyncope, syncope, bleeding, or neurologic sequela. The patient is tolerating medications without difficulties and is otherwise without complaint today. She does say that she has a little bit of fatigue and shortness of breath, but this is been improving ever since her TAVR. She is in atrial fibrillation today and has been for the last at least 110 days.    Past Medical History:  Diagnosis Date  . Anemia    years ago  . Aortic stenosis   . Arthritis   . Asthma   . Atrial fibrillation (HCC)   . CHF (congestive heart failure) (HCC) 11/2014  . Family history of adverse reaction to anesthesia    2 daughters would have N/V  . Glaucoma   . Hearing loss   . Heart murmur   . HTN (hypertension)   . Pneumonia   . Prolapsing mitral leaflet syndrome   . Shortness of breath dyspnea    with exertion  . SVT (supraventricular tachycardia) (HCC)    S/P ablation of AVNRT in 2003   Past Surgical History:  Procedure Laterality Date  . APPENDECTOMY    . BLADDER SURGERY    . CARDIAC CATHETERIZATION N/A 09/26/2015   Procedure: Right/Left Heart Cath and Coronary Angiography;  Surgeon: Christopher D McAlhany, MD;  [facility]: [facility];   Service: Cardiovascular;  Laterality: N/A;  . CARDIAC SURGERY    . EP IMPLANTABLE DEVICE N/A 10/26/2015   Procedure: Pacemaker Implant;  Surgeon: Alizandra Loh Martin Tyaira Heward, MD;  Location: [facility];  Service: Cardiovascular;  Laterality: N/A;  . EYE SURGERY Bilateral    cataract surgery  . NASAL SINUS SURGERY    . TEE WITHOUT CARDIOVERSION N/A 10/25/2015   Procedure: TRANSESOPHAGEAL ECHOCARDIOGRAM (TEE);  Surgeon: Christopher D McAlhany, MD;  Location: [facility];  Service: Open Heart Surgery;  Laterality: N/A;  . TRANSCATHETER AORTIC VALVE REPLACEMENT, TRANSFEMORAL Right 10/25/2015   Procedure: TRANSCATHETER AORTIC VALVE REPLACEMENT, TRANSFEMORAL;  Surgeon: Christopher D McAlhany, MD;  Location: [facility];  Service: Open Heart Surgery;  Laterality: Right;     Current Outpatient Prescriptions  Medication Sig Dispense Refill  . albuterol (PROVENTIL HFA;VENTOLIN HFA) 108 (90 BASE) MCG/ACT inhaler Inhale 2 puffs into the lungs every 4 (four) hours as needed for wheezing or shortness of breath.     . amLODipine (NORVASC) 2.5 MG tablet Take 1 tablet (2.5 mg total) by mouth daily. 30 tablet 0  . apixaban (ELIQUIS) 2.5 MG TABS tablet Take 1 tablet (2.5 mg total) by mouth 2 (two) times daily. 180 tablet 3  . furosemide (LASIX) 40 MG tablet Take 1.5 tablets (60 mg total) by mouth daily. 45 tablet 6   No current facility-administered medications for this visit.     Allergies:   Hctz [hydrochlorothiazide]   Social History:    The patient  reports that she has never smoked. She has never used smokeless tobacco. She reports that she does not drink alcohol or use drugs.   Family History:  The patient's family history includes Hypertension in her mother; Pneumonia in her father.    ROS:  Please see the history of present illness.   Otherwise, review of systems is positive for none.   All other systems are reviewed and negative.    PHYSICAL EXAM: VS:  There were no vitals taken for this visit. , BMI There is no  height or weight on file to calculate BMI. GEN: Well nourished, well developed, in no acute distress  HEENT: normal  Neck: no JVD, carotid bruits, or masses Cardiac: RRR; no murmurs, rubs, or gallops,no edema  Respiratory:  clear to auscultation bilaterally, normal work of breathing GI: soft, nontender, nondistended, + BS MS: no deformity or atrophy  Skin: warm and dry,  device pocket is well healed Neuro:  Strength and sensation are intact Psych: euthymic mood, full affect  EKG:  EKG is ordered today. Personal review of the ekg ordered shows atrial fibrillation, V paced   Device interrogation is reviewed today in detail.  See PaceArt for details.   Recent Labs: 10/21/2015: ALT 19 10/26/2015: Magnesium 2.5 10/28/2015: BUN 8; Creatinine, Ser 0.79; Potassium 4.2; Sodium 134 11/04/2015: Hemoglobin 9.6; Platelets 246    Lipid Panel  No results found for: CHOL, TRIG, HDL, CHOLHDL, VLDL, LDLCALC, LDLDIRECT   Wt Readings from Last 3 Encounters:  11/28/15 112 lb 12.8 oz (51.2 kg)  11/04/15 118 lb (53.5 kg)  10/28/15 117 lb (53.1 kg)      Other studies Reviewed: Additional studies/ records that were reviewed today include: TTE 11/28/15  Review of the above records today demonstrates:  - Left ventricle: The cavity size was normal. Wall thickness was   normal. Systolic function was normal. The estimated ejection   fraction was in the range of 55% to 60%. Indeterminant diastolic   function, atrial fibrillation. Septal bounce consistent with   pacing. - Aortic valve: Bioprosthetic aortic valve s/p TAVR. Mild   perivalvular aortic insufficiency. There was no stenosis. Mean   gradient (S): 5 mm Hg. - Mitral valve: There was moderate regurgitation. - Left atrium: The atrium was moderately dilated. - Right ventricle: The cavity size was normal. Pacer wire or   catheter noted in right ventricle. Systolic function was normal. - Right atrium: The atrium was mildly dilated. - Tricuspid valve:  There was mild-moderate regurgitation. Peak   RV-RA gradient (S): 36 mm Hg. - Pulmonary arteries: PA peak pressure: 39 mm Hg (S). - Inferior vena cava: The vessel was normal in size. The   respirophasic diameter changes were in the normal range (>= 50%),   consistent with normal central venous pressure.   ASSESSMENT AND PLAN:  1.  Atrial fibrillation: anticoagulation with Eliquis. I discussed with her the options of cardioversion, now that she is post aortic valve replacement. We discussed the options of medication management along with cardioversion. She says that she would like to talk this over with her daughters and Kodie Pick call us back with an answer.  This patients CHA2DS2-VASc Score and unadjusted Ischemic Stroke Rate (% per year) is equal to 7.2 % stroke rate/year from a score of 5  Above score calculated as 1 point each if present [CHF, HTN, DM, Vascular=MI/PAD/Aortic Plaque, Age if 65-74, or Female] Above score calculated as 2 points each if present [Age > 75, or Stroke/TIA/TE]     2. Junctional bradycardia: dual chamber pacemaker placed 10/26/15.  Complicated by hematoma formation. no changes made today.    Current medicines are reviewed at length with the patient today.   The patient does not have concerns regarding her medicines.  The following changes were made today:  none  Labs/ tests ordered today include:  No orders of the defined types were placed in this encounter.    Disposition:   FU with Sameria Morss 9 months  Signed, Mclain Freer Martin Tupac Jeffus, MD  02/08/2016 9:28 PM     [facility] [address] [place] [address] [phone] (office) [phone] (fax)  

## 2016-02-09 ENCOUNTER — Encounter: Payer: Self-pay | Admitting: Cardiology

## 2016-02-09 ENCOUNTER — Ambulatory Visit (INDEPENDENT_AMBULATORY_CARE_PROVIDER_SITE_OTHER): Payer: Medicare Other | Admitting: Cardiology

## 2016-02-09 VITALS — BP 154/74 | HR 70 | Ht 62.0 in | Wt 114.0 lb

## 2016-02-09 DIAGNOSIS — I4819 Other persistent atrial fibrillation: Secondary | ICD-10-CM

## 2016-02-09 DIAGNOSIS — R001 Bradycardia, unspecified: Secondary | ICD-10-CM | POA: Diagnosis not present

## 2016-02-09 DIAGNOSIS — I481 Persistent atrial fibrillation: Secondary | ICD-10-CM

## 2016-02-09 LAB — CUP PACEART INCLINIC DEVICE CHECK
Brady Statistic AP VS Percent: 0 %
Brady Statistic AS VS Percent: 10.27 %
Date Time Interrogation Session: 20170817130503
Implantable Lead Implant Date: 20170503
Implantable Lead Location: 753860
Implantable Lead Model: 5076
Lead Channel Impedance Value: 323 Ohm
Lead Channel Pacing Threshold Amplitude: 0.75 V
Lead Channel Pacing Threshold Pulse Width: 0.4 ms
Lead Channel Sensing Intrinsic Amplitude: 11.375 mV
Lead Channel Setting Pacing Amplitude: 2 V
Lead Channel Setting Pacing Pulse Width: 0.4 ms
Lead Channel Setting Sensing Sensitivity: 2.8 mV
MDC IDC LEAD IMPLANT DT: 20170503
MDC IDC LEAD LOCATION: 753859
MDC IDC MSMT BATTERY REMAINING LONGEVITY: 102 mo
MDC IDC MSMT BATTERY VOLTAGE: 3.03 V
MDC IDC MSMT LEADCHNL RA IMPEDANCE VALUE: 475 Ohm
MDC IDC MSMT LEADCHNL RA SENSING INTR AMPL: 3.375 mV
MDC IDC MSMT LEADCHNL RV IMPEDANCE VALUE: 380 Ohm
MDC IDC MSMT LEADCHNL RV IMPEDANCE VALUE: 513 Ohm
MDC IDC SET LEADCHNL RV PACING AMPLITUDE: 2.5 V
MDC IDC STAT BRADY AP VP PERCENT: 0.06 %
MDC IDC STAT BRADY AS VP PERCENT: 89.67 %
MDC IDC STAT BRADY RA PERCENT PACED: 0.06 %
MDC IDC STAT BRADY RV PERCENT PACED: 89.72 %

## 2016-02-09 NOTE — Patient Instructions (Signed)
Medication Instructions:  Your physician recommends that you continue on your current medications as directed. Please refer to the Current Medication list given to you today.   Labwork: None ordered   Testing/Procedures: None ordered   Follow-Up: Your physician wants you to follow-up in: 9 months with Dr Crystal Cooper will receive a reminder letter in the mail two months in advance. If you don't receive a letter, please call our office to schedule the follow-up appointment.  Remote monitoring is used to monitor your Pacemaker from home. This monitoring reduces the number of office visits required to check your device to one time per year. It allows [place] to keep an eye on the functioning of your device to ensure it is working properly. You are scheduled for a device check from home on 05/10/16. You may send your transmission at any time that day. If you have a wireless device, the transmission will be sent automatically. After your physician reviews your transmission, you will receive a postcard with your next transmission date.      Electrical Cardioversion Electrical cardioversion is the delivery of a jolt of electricity to change the rhythm of the heart. Sticky patches or metal paddles are placed on the chest to deliver the electricity from a device. This is done to restore a normal rhythm. A rhythm that is too fast or not regular keeps the heart from pumping well. Electrical cardioversion is done in an emergency if:   There is low or no blood pressure as a result of the heart rhythm.   Normal rhythm must be restored as fast as possible to protect the brain and heart from further damage.   It may save a life. Cardioversion may be done for heart rhythms that are not immediately life threatening, such as atrial fibrillation or flutter, in which:   The heart is beating too fast or is not regular.   Medicine to change the rhythm has not worked.   It is safe to wait in order to allow  time for preparation.  Symptoms of the abnormal rhythm are bothersome.  The risk of stroke and other serious problems can be reduced. LET [facility] CARE PROVIDER KNOW ABOUT:   Any allergies you have.  All medicines you are taking, including vitamins, herbs, eye drops, creams, and over-the-counter medicines.  Previous problems you or members of your family have had with the use of anesthetics.   Any blood disorders you have.   Previous surgeries you have had.   Medical conditions you have. RISKS AND COMPLICATIONS  Generally, this is a safe procedure. However, problems can occur and include:   Breathing problems related to the anesthetic used.  A blood clot that breaks free and travels to other parts of your body. This could cause a stroke or other problems. The risk of this is lowered by use of blood-thinning medicine (anticoagulant) prior to the procedure.  Cardiac arrest (rare). BEFORE THE PROCEDURE   You may have tests to detect blood clots in your heart and to evaluate heart function.  You may start taking anticoagulants so your blood does not clot as easily.   Medicines may be given to help stabilize your heart rate and rhythm. PROCEDURE  You will be given medicine through an IV tube to reduce discomfort and make you sleepy (sedative).   An electrical shock will be delivered. AFTER THE PROCEDURE Your heart rhythm will be watched to make sure it does not change.    This information is not  intended to replace advice given to you by your health care provider. Make sure you discuss any questions you have with your health care provider.   Document Released: 06/01/2002 Document Revised: 07/02/2014 Document Reviewed: 12/24/2012 Elsevier Interactive Patient Education [employer].

## 2016-02-13 ENCOUNTER — Telehealth: Payer: Self-pay | Admitting: Cardiology

## 2016-02-13 DIAGNOSIS — I509 Heart failure, unspecified: Secondary | ICD-10-CM | POA: Diagnosis not present

## 2016-02-13 DIAGNOSIS — Z01812 Encounter for preprocedural laboratory examination: Secondary | ICD-10-CM

## 2016-02-13 DIAGNOSIS — I480 Paroxysmal atrial fibrillation: Secondary | ICD-10-CM

## 2016-02-13 DIAGNOSIS — E871 Hypo-osmolality and hyponatremia: Secondary | ICD-10-CM | POA: Diagnosis not present

## 2016-02-13 NOTE — Telephone Encounter (Signed)
New Message  Pt voiced she is calling due to prior discussion she and MD Camnitz had on Thursday during her appt.  Pt voiced MD Camnitz offered the procedure of shocking of the heart to get it back in rhythm and pt is calling to confirm.  Pt voiced she never confirmed or denied recommendation on Thursday during the appt but calling to confirm she wants it done.  Pt voiced it has to be done after 9.14 due to family vacation previously scheduled.  Please follow up with pt. Thanks!

## 2016-02-14 ENCOUNTER — Encounter: Payer: Self-pay | Admitting: *Deleted

## 2016-02-14 NOTE — Telephone Encounter (Signed)
DCCV scheduled for 9/8.   Patient will have pre procedure labs next week in [place]. Letter of instructions completed and patient will pick up when she goes by [place] next week. Patient verbalized understanding and agreeable to plan.

## 2016-02-14 NOTE — Telephone Encounter (Signed)
Follow Up:   September 8th is the date she would like to have her procedure please.i

## 2016-02-17 ENCOUNTER — Other Ambulatory Visit: Payer: Self-pay | Admitting: Cardiology

## 2016-02-22 ENCOUNTER — Telehealth: Payer: Self-pay | Admitting: Cardiology

## 2016-02-22 NOTE — Telephone Encounter (Signed)
Patient given instructions. She verbalized understanding.

## 2016-02-22 NOTE — Telephone Encounter (Signed)
New message    Pt verbalized that she is calling for the rn to go over her instructions for [place]

## 2016-02-24 ENCOUNTER — Other Ambulatory Visit (HOSPITAL_COMMUNITY)
Admission: RE | Admit: 2016-02-24 | Discharge: 2016-02-24 | Disposition: A | Discharge status: Home / Self Care | Payer: Medicare Other | Source: Ambulatory Visit | Attending: Cardiology | Admitting: Cardiology

## 2016-02-24 DIAGNOSIS — Z01812 Encounter for preprocedural laboratory examination: Secondary | ICD-10-CM | POA: Insufficient documentation

## 2016-02-24 DIAGNOSIS — I480 Paroxysmal atrial fibrillation: Secondary | ICD-10-CM | POA: Insufficient documentation

## 2016-02-24 LAB — CBC WITH DIFFERENTIAL/PLATELET
Basophils Absolute: 0 10*3/uL (ref 0.0–0.1)
Basophils Relative: 1 %
EOS PCT: 2 %
Eosinophils Absolute: 0.1 10*3/uL (ref 0.0–0.7)
HEMATOCRIT: 35.5 % — AB (ref 36.0–46.0)
Hemoglobin: 11.9 g/dL — ABNORMAL LOW (ref 12.0–15.0)
LYMPHS ABS: 1.2 10*3/uL (ref 0.7–4.0)
LYMPHS PCT: 29 %
MCH: 29.5 pg (ref 26.0–34.0)
MCHC: 33.5 g/dL (ref 30.0–36.0)
MCV: 87.9 fL (ref 78.0–100.0)
MONO ABS: 0.6 10*3/uL (ref 0.1–1.0)
Monocytes Relative: 14 %
NEUTROS ABS: 2.3 10*3/uL (ref 1.7–7.7)
Neutrophils Relative %: 54 %
PLATELETS: 207 10*3/uL (ref 150–400)
RBC: 4.04 MIL/uL (ref 3.87–5.11)
RDW: 14.1 % (ref 11.5–15.5)
WBC: 4.2 10*3/uL (ref 4.0–10.5)

## 2016-02-24 LAB — BASIC METABOLIC PANEL
Anion gap: 5 (ref 5–15)
BUN: 13 mg/dL (ref 6–20)
CHLORIDE: 97 mmol/L — AB (ref 101–111)
CO2: 28 mmol/L (ref 22–32)
Calcium: 8.8 mg/dL — ABNORMAL LOW (ref 8.9–10.3)
Creatinine, Ser: 0.77 mg/dL (ref 0.44–1.00)
GFR calc Af Amer: >60 mL/min (ref >60)
GFR calc non Af Amer: >60 mL/min (ref >60)
GLUCOSE: 96 mg/dL (ref 65–99)
POTASSIUM: 3.9 mmol/L (ref 3.5–5.1)
Sodium: 130 mmol/L — ABNORMAL LOW (ref 135–145)

## 2016-03-02 ENCOUNTER — Ambulatory Visit (HOSPITAL_COMMUNITY): Payer: Medicare Other | Admitting: Anesthesiology

## 2016-03-02 ENCOUNTER — Encounter (HOSPITAL_COMMUNITY)
Admission: RE | Disposition: A | Discharge status: Home / Self Care | Payer: Self-pay | Source: Ambulatory Visit | Attending: Cardiovascular Disease

## 2016-03-02 ENCOUNTER — Ambulatory Visit (HOSPITAL_COMMUNITY)
Admission: RE | Admit: 2016-03-02 | Discharge: 2016-03-02 | Disposition: A | Discharge status: Home / Self Care | Payer: Medicare Other | Source: Ambulatory Visit | Attending: Cardiovascular Disease | Admitting: Cardiovascular Disease

## 2016-03-02 ENCOUNTER — Encounter (HOSPITAL_COMMUNITY): Payer: Self-pay | Admitting: *Deleted

## 2016-03-02 DIAGNOSIS — Z950 Presence of cardiac pacemaker: Secondary | ICD-10-CM | POA: Diagnosis not present

## 2016-03-02 DIAGNOSIS — I481 Persistent atrial fibrillation: Secondary | ICD-10-CM | POA: Diagnosis not present

## 2016-03-02 DIAGNOSIS — Z952 Presence of prosthetic heart valve: Secondary | ICD-10-CM | POA: Diagnosis not present

## 2016-03-02 DIAGNOSIS — I110 Hypertensive heart disease with heart failure: Secondary | ICD-10-CM | POA: Diagnosis not present

## 2016-03-02 DIAGNOSIS — I4891 Unspecified atrial fibrillation: Secondary | ICD-10-CM | POA: Diagnosis not present

## 2016-03-02 DIAGNOSIS — I5032 Chronic diastolic (congestive) heart failure: Secondary | ICD-10-CM | POA: Diagnosis not present

## 2016-03-02 DIAGNOSIS — Z79899 Other long term (current) drug therapy: Secondary | ICD-10-CM | POA: Diagnosis not present

## 2016-03-02 DIAGNOSIS — D509 Iron deficiency anemia, unspecified: Secondary | ICD-10-CM | POA: Diagnosis not present

## 2016-03-02 DIAGNOSIS — Z7901 Long term (current) use of anticoagulants: Secondary | ICD-10-CM | POA: Diagnosis not present

## 2016-03-02 DIAGNOSIS — I499 Cardiac arrhythmia, unspecified: Secondary | ICD-10-CM | POA: Diagnosis not present

## 2016-03-02 DIAGNOSIS — I10 Essential (primary) hypertension: Secondary | ICD-10-CM | POA: Diagnosis not present

## 2016-03-02 DIAGNOSIS — J45909 Unspecified asthma, uncomplicated: Secondary | ICD-10-CM | POA: Diagnosis not present

## 2016-03-02 HISTORY — PX: CARDIOVERSION: SHX1299

## 2016-03-02 SURGERY — CARDIOVERSION
Anesthesia: Monitor Anesthesia Care

## 2016-03-02 MED ORDER — PROPOFOL 10 MG/ML IV BOLUS
INTRAVENOUS | Status: DC | PRN
  Administered 2016-03-02 (×2): 10 mg via INTRAVENOUS
  Administered 2016-03-02: 40 mg via INTRAVENOUS

## 2016-03-02 MED ORDER — SODIUM CHLORIDE 0.9 % IV SOLN
INTRAVENOUS | Status: DC
  Administered 2016-03-02: 500 mL via INTRAVENOUS

## 2016-03-02 MED ORDER — LIDOCAINE 2% (20 MG/ML) 5 ML SYRINGE
INTRAMUSCULAR | Status: DC | PRN
  Administered 2016-03-02: 60 mg via INTRAVENOUS

## 2016-03-02 NOTE — H&P (View-Only) (Signed)
Electrophysiology Office Note   Date:  02/08/2016   ID:  Crystal Cooper, DOB 1927/01/21, MRN [id number]  PCP:  Stephens Shire, MD  Cardiologist:  Jerolyn Center Primary Electrophysiologist:  Constance Haw, MD    No chief complaint on file.    History of Present Illness: Crystal Cooper is a 82 y.o. female who presents today for electrophysiology evaluation.   Hx severe aortic valve stenosis, HTN, persistent atrial fibrillation on Eliquis, asthma, SVT, chronic diastolic CHF.  Had TAVR  0/1/74 complicated by junctional bradycardia.  Dual chamber pacemaker implanted complicated by pocket hematoma.   Today, she denies symptoms of palpitations, chest pain, shortness of breath, orthopnea, PND, lower extremity edema, claudication, dizziness, presyncope, syncope, bleeding, or neurologic sequela. The patient is tolerating medications without difficulties and is otherwise without complaint today. She does say that she has a little bit of fatigue and shortness of breath, but this is been improving ever since her TAVR. She is in atrial fibrillation today and has been for the last at least 110 days.    Past Medical History:  Diagnosis Date  . Anemia    years ago  . Aortic stenosis   . Arthritis   . Asthma   . Atrial fibrillation ([place])   . CHF (congestive heart failure) ([place]) 11/2014  . Family history of adverse reaction to anesthesia    2 daughters would have N/V  . Glaucoma   . Hearing loss   . Heart murmur   . HTN (hypertension)   . Pneumonia   . Prolapsing mitral leaflet syndrome   . Shortness of breath dyspnea    with exertion  . SVT (supraventricular tachycardia) (HCC)    S/P ablation of AVNRT in 2003   Past Surgical History:  Procedure Laterality Date  . APPENDECTOMY    . BLADDER SURGERY    . CARDIAC CATHETERIZATION N/A 09/26/2015   Procedure: Right/Left Heart Cath and Coronary Angiography;  Surgeon: Burnell Blanks, MD;  Location: [place] [facility];   Service: Cardiovascular;  Laterality: N/A;  . CARDIAC SURGERY    . EP IMPLANTABLE DEVICE N/A 10/26/2015   Procedure: Pacemaker Implant;  Surgeon: Jurrell Royster Meredith Leeds, MD;  Location: [place] [facility];  Service: Cardiovascular;  Laterality: N/A;  . EYE SURGERY Bilateral    cataract surgery  . NASAL SINUS SURGERY    . TEE WITHOUT CARDIOVERSION N/A 10/25/2015   Procedure: TRANSESOPHAGEAL ECHOCARDIOGRAM (TEE);  Surgeon: Burnell Blanks, MD;  Location: [place];  Service: Open Heart Surgery;  Laterality: N/A;  . TRANSCATHETER AORTIC VALVE REPLACEMENT, TRANSFEMORAL Right 10/25/2015   Procedure: TRANSCATHETER AORTIC VALVE REPLACEMENT, TRANSFEMORAL;  Surgeon: Burnell Blanks, MD;  Location: [place];  Service: Open Heart Surgery;  Laterality: Right;     Current Outpatient Prescriptions  Medication Sig Dispense Refill  . albuterol (PROVENTIL HFA;VENTOLIN HFA) 108 (90 BASE) MCG/ACT inhaler Inhale 2 puffs into the lungs every 4 (four) hours as needed for wheezing or shortness of breath.     [name] amLODipine (NORVASC) 2.5 MG tablet Take 1 tablet (2.5 mg total) by mouth daily. 30 tablet 0  . apixaban (ELIQUIS) 2.5 MG TABS tablet Take 1 tablet (2.5 mg total) by mouth 2 (two) times daily. 180 tablet 3  . furosemide (LASIX) 40 MG tablet Take 1.5 tablets (60 mg total) by mouth daily. 45 tablet 6   No current facility-administered medications for this visit.     Allergies:   Hctz [hydrochlorothiazide]   Social History:  The patient  reports that she has never smoked. She has never used smokeless tobacco. She reports that she does not drink alcohol or use drugs.   Family History:  The patient's family history includes Hypertension in her mother; Pneumonia in her father.    ROS:  Please see the history of present illness.   Otherwise, review of systems is positive for none.   All other systems are reviewed and negative.    PHYSICAL EXAM: VS:  There were no vitals taken for this visit. , BMI There is no  height or weight on file to calculate BMI. GEN: Well nourished, well developed, in no acute distress  HEENT: normal  Neck: no JVD, carotid bruits, or masses Cardiac: RRR; no murmurs, rubs, or gallops,no edema  Respiratory:  clear to auscultation bilaterally, normal work of breathing GI: soft, nontender, nondistended, + BS MS: no deformity or atrophy  Skin: warm and dry,  device pocket is well healed Neuro:  Strength and sensation are intact Psych: euthymic mood, full affect  EKG:  EKG is ordered today. Personal review of the ekg ordered shows atrial fibrillation, V paced   Device interrogation is reviewed today in detail.  See PaceArt for details.   Recent Labs: 10/21/2015: ALT 19 10/26/2015: Magnesium 2.5 10/28/2015: BUN 8; Creatinine, Ser 0.79; Potassium 4.2; Sodium 134 11/04/2015: Hemoglobin 9.6; Platelets 246    Lipid Panel  No results found for: CHOL, TRIG, HDL, CHOLHDL, VLDL, LDLCALC, LDLDIRECT   Wt Readings from Last 3 Encounters:  11/28/15 112 lb 12.8 oz (51.2 kg)  11/04/15 118 lb (53.5 kg)  10/28/15 117 lb (53.1 kg)      Other studies Reviewed: Additional studies/ records that were reviewed today include: TTE 11/28/15  Review of the above records today demonstrates:  - Left ventricle: The cavity size was normal. Wall thickness was   normal. Systolic function was normal. The estimated ejection   fraction was in the range of 55% to 60%. Indeterminant diastolic   function, atrial fibrillation. Septal bounce consistent with   pacing. - Aortic valve: Bioprosthetic aortic valve s/p TAVR. Mild   perivalvular aortic insufficiency. There was no stenosis. Mean   gradient (S): 5 mm Hg. - Mitral valve: There was moderate regurgitation. - Left atrium: The atrium was moderately dilated. - Right ventricle: The cavity size was normal. Pacer wire or   catheter noted in right ventricle. Systolic function was normal. - Right atrium: The atrium was mildly dilated. - Tricuspid valve:  There was mild-moderate regurgitation. Peak   RV-RA gradient (S): 36 mm Hg. - Pulmonary arteries: PA peak pressure: 39 mm Hg (S). - Inferior vena cava: The vessel was normal in size. The   respirophasic diameter changes were in the normal range (>= 50%),   consistent with normal central venous pressure.   ASSESSMENT AND PLAN:  1.  Atrial fibrillation: anticoagulation with Eliquis. I discussed with her the options of cardioversion, now that she is post aortic valve replacement. We discussed the options of medication management along with cardioversion. She says that she would like to talk this over with her daughters and Cedarius Kersh call us back with an answer.  This patients CHA2DS2-VASc Score and unadjusted Ischemic Stroke Rate (% per year) is equal to 7.2 % stroke rate/year from a score of 5  Above score calculated as 1 point each if present [CHF, HTN, DM, Vascular=MI/PAD/Aortic Plaque, Age if 65-74, or Female] Above score calculated as 2 points each if present [Age > 75, or Stroke/TIA/TE]  2. Junctional bradycardia: dual chamber pacemaker placed 10/26/15.  Complicated by hematoma formation. no changes made today.    Current medicines are reviewed at length with the patient today.   The patient does not have concerns regarding her medicines.  The following changes were made today:  none  Labs/ tests ordered today include:  No orders of the defined types were placed in this encounter.    Disposition:   FU with Kadey Mihalic 9 months  Signed, Ashad Fawbush Meredith Leeds, MD  02/08/2016 9:28 PM     [facility] [address] [place] [address] [phone] (office) [phone] (fax)

## 2016-03-02 NOTE — Anesthesia Preprocedure Evaluation (Signed)
Anesthesia Evaluation  Patient identified by MRN, date of birth, ID band Patient awake    Reviewed: Allergy & Precautions, NPO status , Patient's Chart, lab work & pertinent test results, reviewed documented beta blocker date and time   History of Anesthesia Complications Negative for: history of anesthetic complications  Airway Mallampati: I  TM Distance: >3 FB Neck ROM: Full    Dental  (+) Edentulous Upper, Edentulous Lower, Dental Advisory Given   Pulmonary shortness of breath, asthma ,    Pulmonary exam normal breath sounds clear to auscultation       Cardiovascular hypertension, Pt. on medications Normal cardiovascular exam+ dysrhythmias + Valvular Problems/Murmurs (s/p TAVR) MR  Rhythm:Regular - Systolic murmurs Left ventricle: The cavity size was normal. Wall thickness was   normal. Systolic function was normal. The estimated ejection   fraction was in the range of 55% to 60%. Indeterminant diastolic   function, atrial fibrillation. Septal bounce consistent with   pacing. - Aortic valve: Bioprosthetic aortic valve s/p TAVR. Mild   perivalvular aortic insufficiency. There was no stenosis. Mean   gradient (S): 5 mm Hg. - Mitral valve: There was moderate regurgitation. - Left atrium: The atrium was moderately dilated. - Right ventricle: The cavity size was normal. Pacer wire or   catheter noted in right ventricle. Systolic function was normal. - Right atrium: The atrium was mildly dilated. - Tricuspid valve: There was mild-moderate regurgitation. Peak   RV-RA gradient (S): 36 mm Hg. - Pulmonary arteries: PA peak pressure: 39 mm Hg (S). - Inferior vena cava: The vessel was normal in size. The   respirophasic diameter changes were in the normal range (>= 50%),   consistent with normal central venous pressure.   Neuro/Psych negative neurological ROS  negative psych ROS   GI/Hepatic negative GI ROS, Neg liver ROS,    Endo/Other  negative endocrine ROS  Renal/GU negative Renal ROS     Musculoskeletal  (+) Arthritis ,   Abdominal   Peds  Hematology negative hematology ROS (+)   Anesthesia Other Findings   Reproductive/Obstetrics                             Anesthesia Physical  Anesthesia Plan  ASA: III  Anesthesia Plan: MAC   Post-op Pain Management:    Induction: Intravenous  Airway Management Planned: Mask  Additional Equipment:   Intra-op Plan:   Post-operative Plan:   Informed Consent:   Plan Discussed with: Surgeon and CRNA  Anesthesia Plan Comments:         Anesthesia Quick Evaluation

## 2016-03-02 NOTE — Anesthesia Postprocedure Evaluation (Signed)
Anesthesia Post Note  Patient: Crystal Cooper  Procedure(s) Performed: Procedure(s) (LRB): CARDIOVERSION (N/A)  Patient location during evaluation: PACU Anesthesia Type: MAC Level of consciousness: awake and alert Pain management: pain level controlled Vital Signs Assessment: post-procedure vital signs reviewed and stable Respiratory status: spontaneous breathing, nonlabored ventilation, respiratory function stable and patient connected to nasal cannula oxygen Cardiovascular status: stable and blood pressure returned to baseline Anesthetic complications: no    Last Vitals:  Vitals:   03/02/16 1120 03/02/16 1130  BP: (!) 164/83 (!) 179/85  Pulse: 68 80  Resp: (!) 23 (!) 24  Temp:      Last Pain:  Vitals:   03/02/16 1111  TempSrc: Oral                 [name]

## 2016-03-02 NOTE — Interval H&P Note (Signed)
History and Physical Interval Note:  03/02/2016 10:19 AM  Crystal Cooper  has presented today for surgery, with the diagnosis of a fib  The various methods of treatment have been discussed with the patient and family. After consideration of risks, benefits and other options for treatment, the patient has consented to  Procedure(s): CARDIOVERSION (N/A) as a surgical intervention .  The patient's history has been reviewed, patient examined, no change in status, stable for surgery.  I have reviewed the patient's chart and labs.  Questions were answered to the patient's satisfaction.     Mertie Moores

## 2016-03-02 NOTE — Discharge Instructions (Signed)
Electrical Cardioversion, Care After °Refer to this sheet in the next few weeks. These instructions provide you with information on caring for yourself after your procedure. Your health care provider may also give you more specific instructions. Your treatment has been planned according to current medical practices, but problems sometimes occur. Call your health care provider if you have any problems or questions after your procedure. °WHAT TO EXPECT AFTER THE PROCEDURE °After your procedure, it is typical to have the following sensations: °· Some redness on the skin where the shocks were delivered. If this is tender, a sunburn lotion or hydrocortisone cream may help. °· Possible return of an abnormal heart rhythm within hours or days after the procedure. °HOME CARE INSTRUCTIONS °· Take medicines only as directed by your health care provider. Be sure you understand how and when to take your medicine. °· Learn how to feel your pulse and check it often. °· Limit your activity for 48 hours after the procedure or as directed by your health care provider. °· Avoid or minimize caffeine and other stimulants as directed by your health care provider. °SEEK MEDICAL CARE IF: °· You feel like your heart is beating too fast or your pulse is not regular. °· You have any questions about your medicines. °· You have bleeding that will not stop. °SEEK IMMEDIATE MEDICAL CARE IF: °· You are dizzy or feel faint. °· It is hard to breathe or you feel short of breath. °· There is a change in discomfort in your chest. °· Your speech is slurred or you have trouble moving an arm or leg on one side of your body. °· You get a serious muscle cramp that does not go away. °· Your fingers or toes turn cold or blue. °  °This information is not intended to replace advice given to you by your health care provider. Make sure you discuss any questions you have with your health care provider. °  °Document Released: 04/01/2013 Document Revised: 07/02/2014  Document Reviewed: 04/01/2013 °Elsevier Interactive Patient Education ©2016 [employer] ° °

## 2016-03-02 NOTE — Transfer of Care (Signed)
Immediate Anesthesia Transfer of Care Note  Patient: Crystal Cooper  Procedure(s) Performed: Procedure(s): CARDIOVERSION (N/A)  Patient Location: Endoscopy Unit  Anesthesia Type:MAC  Level of Consciousness: awake  Airway & Oxygen Therapy: Patient Spontanous Breathing and Patient connected to nasal cannula oxygen  Post-op Assessment: Report given to RN, Post -op Vital signs reviewed and stable and Patient moving all extremities  Post vital signs: Reviewed and stable  Last Vitals:  Vitals:   03/02/16 0940  BP: (!) 154/94  Resp: 16  Temp: 36.6 C    Last Pain:  Vitals:   03/02/16 0940  TempSrc: Oral         Complications: No apparent anesthesia complications

## 2016-03-02 NOTE — CV Procedure (Signed)
    Cardioversion Note  Crystal Cooper [id number] Jun 28, 1926  Procedure: DC Cardioversion Indications: atrial fib  Procedure Details Consent: Obtained Time Out: Verified patient identification, verified procedure, site/side was marked, verified correct patient position, special equipment/implants available, Radiology Safety Procedures followed,  medications/allergies/relevent history reviewed, required imaging and test results available.  Performed  The patient has been on adequate anticoagulation.  The patient received Lidocaine 60  Mg IV followed by Propofol 60 mg IV  for sedation.  Synchronous cardioversion was performed at 120  joules.  The cardioversion was successful     Complications: No apparent complications Patient did tolerate procedure well.   Thayer Headings, Brooke Bonito., MD, [facility] 03/02/2016, 11:04 AM

## 2016-03-26 DIAGNOSIS — H401131 Primary open-angle glaucoma, bilateral, mild stage: Secondary | ICD-10-CM | POA: Diagnosis not present

## 2016-04-19 DIAGNOSIS — H401131 Primary open-angle glaucoma, bilateral, mild stage: Secondary | ICD-10-CM | POA: Diagnosis not present

## 2016-04-26 DIAGNOSIS — Z23 Encounter for immunization: Secondary | ICD-10-CM | POA: Diagnosis not present

## 2016-05-10 ENCOUNTER — Ambulatory Visit (INDEPENDENT_AMBULATORY_CARE_PROVIDER_SITE_OTHER): Payer: Medicare Other | Admitting: *Deleted

## 2016-05-10 ENCOUNTER — Telehealth: Payer: Self-pay | Admitting: Cardiology

## 2016-05-10 DIAGNOSIS — R001 Bradycardia, unspecified: Secondary | ICD-10-CM | POA: Diagnosis not present

## 2016-05-10 NOTE — Telephone Encounter (Signed)
LMOVM reminding pt to send remote transmission.   

## 2016-05-11 NOTE — Progress Notes (Signed)
Remote pacemaker transmission.   

## 2016-05-16 ENCOUNTER — Encounter: Payer: Self-pay | Admitting: Cardiology

## 2016-05-19 LAB — CUP PACEART REMOTE DEVICE CHECK
Battery Voltage: 3.02 V
Brady Statistic AP VP Percent: 7.68 %
Brady Statistic RA Percent Paced: 7.71 %
Implantable Lead Implant Date: 20170503
Implantable Lead Location: 753860
Implantable Pulse Generator Implant Date: 20170503
Lead Channel Impedance Value: 380 Ohm
Lead Channel Impedance Value: 475 Ohm
Lead Channel Pacing Threshold Amplitude: 0.625 V
Lead Channel Pacing Threshold Pulse Width: 0.4 ms
Lead Channel Pacing Threshold Pulse Width: 0.4 ms
Lead Channel Setting Pacing Amplitude: 2 V
Lead Channel Setting Sensing Sensitivity: 2.8 mV
MDC IDC LEAD IMPLANT DT: 20170503
MDC IDC LEAD LOCATION: 753859
MDC IDC MSMT BATTERY REMAINING LONGEVITY: 105 mo
MDC IDC MSMT LEADCHNL RA IMPEDANCE VALUE: 342 Ohm
MDC IDC MSMT LEADCHNL RA IMPEDANCE VALUE: 494 Ohm
MDC IDC MSMT LEADCHNL RA SENSING INTR AMPL: 5.625 mV
MDC IDC MSMT LEADCHNL RA SENSING INTR AMPL: 5.625 mV
MDC IDC MSMT LEADCHNL RV PACING THRESHOLD AMPLITUDE: 0.75 V
MDC IDC MSMT LEADCHNL RV SENSING INTR AMPL: 14 mV
MDC IDC MSMT LEADCHNL RV SENSING INTR AMPL: 14 mV
MDC IDC SESS DTM: 20171116202256
MDC IDC SET LEADCHNL RV PACING AMPLITUDE: 2.5 V
MDC IDC SET LEADCHNL RV PACING PULSEWIDTH: 0.4 ms
MDC IDC STAT BRADY AP VS PERCENT: 0.03 %
MDC IDC STAT BRADY AS VP PERCENT: 89.23 %
MDC IDC STAT BRADY AS VS PERCENT: 3.06 %
MDC IDC STAT BRADY RV PERCENT PACED: 96.91 %

## 2016-06-18 DIAGNOSIS — R404 Transient alteration of awareness: Secondary | ICD-10-CM | POA: Diagnosis not present

## 2016-06-18 DIAGNOSIS — R55 Syncope and collapse: Secondary | ICD-10-CM | POA: Diagnosis not present

## 2016-06-18 DIAGNOSIS — R001 Bradycardia, unspecified: Secondary | ICD-10-CM | POA: Diagnosis not present

## 2016-06-27 NOTE — Progress Notes (Signed)
HPI: FU AVR. History of ablation of AVNRT in 2003. Echocardiogram repeated February 2017 and showed normal LV systolic function, severe aortic stenosis with mean gradient 43 mmHg, mild mitral regurgitation, moderate tricuspid regurgitation and mildly elevated pulmonary pressures. Cath 2/17 showed no significant CAD. CT 4/17 showed nodule and fu recommended 3 months. Had TAVR 5/17. Echo 6/17 showed normal LV function; s/p TAVR with mild AI, moderate MR; biatrial enlargement and mild to moderate TR. Had PM 5/17. Also with afib/flutter. Had DCCV 03/02/16. Since last seen, her dyspnea has improved. She denies orthopnea, PND, pedal edema or chest pain. On Christmas day patient states she had a syncopal episode. She sat down at the table and had syncope for less than 5 minutes. No preceding chest pain, nausea or palpitations. She was dyspneic. She felt well afterwards other than diaphoresis. No symptoms or event since then.  Current Outpatient Prescriptions  Medication Sig Dispense Refill  . albuterol (PROVENTIL HFA;VENTOLIN HFA) 108 (90 BASE) MCG/ACT inhaler Inhale 2 puffs into the lungs every 4 (four) hours as needed for wheezing or shortness of breath.     [name] amLODipine (NORVASC) 5 MG tablet Take 1 tablet (5 mg total) by mouth daily. 30 tablet 3  . apixaban (ELIQUIS) 2.5 MG TABS tablet Take 1 tablet (2.5 mg total) by mouth 2 (two) times daily. 180 tablet 3  . Biotin 10000 MCG TABS Take by mouth.    . furosemide (LASIX) 40 MG tablet Take 1.5 tablets (60 mg total) by mouth daily. 45 tablet 6  . multivitamin-iron-minerals-folic acid (CENTRUM) chewable tablet Chew 1 tablet by mouth daily.     No current facility-administered medications for this visit.      Past Medical History:  Diagnosis Date  . Anemia    years ago  . Aortic stenosis   . Arthritis   . Asthma   . Atrial fibrillation ([place])   . CHF (congestive heart failure) ([place]) 11/2014  . Family history of adverse reaction to anesthesia    2 daughters would have N/V  . Glaucoma   . Hearing loss   . Heart murmur   . HTN (hypertension)   . Pneumonia   . Prolapsing mitral leaflet syndrome   . Shortness of breath dyspnea    with exertion  . SVT (supraventricular tachycardia) (HCC)    S/P ablation of AVNRT in 2003    Past Surgical History:  Procedure Laterality Date  . APPENDECTOMY    . BLADDER SURGERY    . CARDIAC CATHETERIZATION N/A 09/26/2015   Procedure: Right/Left Heart Cath and Coronary Angiography;  Surgeon: Burnell Blanks, MD;  Location: [place] [facility];  Service: Cardiovascular;  Laterality: N/A;  . CARDIAC SURGERY    . CARDIOVERSION N/A 03/02/2016   Procedure: CARDIOVERSION;  Surgeon: Thayer Headings, MD;  Location: [place];  Service: Cardiovascular;  Laterality: N/A;  . EP IMPLANTABLE DEVICE N/A 10/26/2015   Procedure: Pacemaker Implant;  Surgeon: Will Meredith Leeds, MD;  Location: [place] [facility];  Service: Cardiovascular;  Laterality: N/A;  . EYE SURGERY Bilateral    cataract surgery  . NASAL SINUS SURGERY    . TEE WITHOUT CARDIOVERSION N/A 10/25/2015   Procedure: TRANSESOPHAGEAL ECHOCARDIOGRAM (TEE);  Surgeon: Burnell Blanks, MD;  Location: [place];  Service: Open Heart Surgery;  Laterality: N/A;  . TRANSCATHETER AORTIC VALVE REPLACEMENT, TRANSFEMORAL Right 10/25/2015   Procedure: TRANSCATHETER AORTIC VALVE REPLACEMENT, TRANSFEMORAL;  Surgeon: Burnell Blanks, MD;  Location: [place];  Service: Open Heart Surgery;  Laterality: Right;    Social History   Social History  . Marital status: Widowed    Spouse name: N/A  . Number of children: 3  . Years of education: N/A   Occupational History  . Not on file.   Social History Main Topics  . Smoking status: Never Smoker  . Smokeless tobacco: Never Used  . Alcohol use No  . Drug use: No  . Sexual activity: Not on file   Other Topics Concern  . Not on file   Social History Narrative  . No narrative on file    Family History    Problem Relation Age of Onset  . Hypertension Mother   . Pneumonia Father   . Heart attack Neg Hx     ROS: no fevers or chills, productive cough, hemoptysis, dysphasia, odynophagia, melena, hematochezia, dysuria, hematuria, rash, seizure activity, orthopnea, PND, pedal edema, claudication. Remaining systems are negative.  Physical Exam: Well-developed thin in no acute distress.  Skin is warm and dry.  HEENT is normal.  Neck is supple.  Chest is clear to auscultation with normal expansion. Pacemaker left chest.Cardiovascular exam is regular rate and rhythm. No diastolic murmur Abdominal exam nontender or distended. No masses palpated. Extremities show no edema. neuro grossly intact  ECG Atrial sensed ventricular paced.  A/P  1 status post TAVR-continue SBE prophylaxis.  2 status post pacemaker-management per electrophysiology.  3 history of atrial fibrillation-patient in sinus today. Continue apixaban.   4 chronic diastolic congestive heart failure-continue present dose of diuretics.  5 hypertension-blood pressure controlled. Continue present medications.  6 Abnormal chest CT-will schedule follow-up noncontrast study.  7 syncope-etiology unclear. Question vagal episode. We will have her pacemaker interrogated. Repeat echocardiogram. Patient instructed not to drive for 6 months. No events since Christmas day.   [name], MD

## 2016-06-29 ENCOUNTER — Encounter (INDEPENDENT_AMBULATORY_CARE_PROVIDER_SITE_OTHER): Payer: Self-pay

## 2016-06-29 ENCOUNTER — Ambulatory Visit (INDEPENDENT_AMBULATORY_CARE_PROVIDER_SITE_OTHER): Payer: Medicare Other | Admitting: Pediatrics

## 2016-06-29 ENCOUNTER — Encounter: Payer: Self-pay | Admitting: Pediatrics

## 2016-06-29 VITALS — BP 176/83 | HR 81 | Temp 96.9°F | Ht 62.0 in | Wt 113.2 lb

## 2016-06-29 DIAGNOSIS — I10 Essential (primary) hypertension: Secondary | ICD-10-CM | POA: Diagnosis not present

## 2016-06-29 DIAGNOSIS — E871 Hypo-osmolality and hyponatremia: Secondary | ICD-10-CM | POA: Diagnosis not present

## 2016-06-29 DIAGNOSIS — I4819 Other persistent atrial fibrillation: Secondary | ICD-10-CM

## 2016-06-29 DIAGNOSIS — I5032 Chronic diastolic (congestive) heart failure: Secondary | ICD-10-CM

## 2016-06-29 DIAGNOSIS — I481 Persistent atrial fibrillation: Secondary | ICD-10-CM

## 2016-06-29 LAB — BMP8+EGFR
BUN / CREAT RATIO: 18 (ref 12–28)
BUN: 16 mg/dL (ref 8–27)
CALCIUM: 9.2 mg/dL (ref 8.7–10.3)
CHLORIDE: 92 mmol/L — AB (ref 96–106)
CO2: 28 mmol/L (ref 18–29)
CREATININE: 0.88 mg/dL (ref 0.57–1.00)
GFR calc non Af Amer: 59 mL/min/{1.73_m2} — ABNORMAL LOW (ref >59)
GFR, EST AFRICAN AMERICAN: 68 mL/min/{1.73_m2} (ref >59)
Glucose: 92 mg/dL (ref 65–99)
Potassium: 4.1 mmol/L (ref 3.5–5.2)
Sodium: 136 mmol/L (ref 134–144)

## 2016-06-29 MED ORDER — FUROSEMIDE 40 MG PO TABS: 60.0000 mg | ORAL_TABLET | Freq: Every day | ORAL | 6 refills | Status: DC

## 2016-06-29 MED ORDER — AMLODIPINE BESYLATE 5 MG PO TABS: 5.0000 mg | ORAL_TABLET | Freq: Every day | ORAL | 3 refills | Status: DC

## 2016-06-29 MED ORDER — APIXABAN 2.5 MG PO TABS: 2.5000 mg | ORAL_TABLET | Freq: Two times a day (BID) | ORAL | 3 refills | Status: DC

## 2016-06-29 NOTE — Progress Notes (Signed)
Subjective:   Patient ID: Crystal Cooper, female    DOB: 1926/07/05, 83 y.o.   MRN: [id number] CC: New Patient (Initial Visit)  HPI: Crystal Cooper is a 83 y.o. female presenting for New Patient (Initial Visit)  Pt's long time PCP has recently retired, here to establish care  HTN: no CP, SON Has not had BP med change for past 2-3 yrs Takes 2.22m of amlodipine daily  Aortic valve stenosis: s/p valve replacement  Afib: has a pacemaker On apixaban No trouble with bleeding  Energy level has been fine Appetite has been  CHF: no swelling in legs Weights in the morning--stays around 111 lbs Takes 6551mlasix daily  Lives at home alone Daughters live 8 miles in either direction Minimal driving now  Past Medical History:  Diagnosis Date  . Anemia    years ago  . Aortic stenosis   . Arthritis   . Asthma   . Atrial fibrillation ([place]  . CHF (congestive heart failure) ([place]/2016  . Family history of adverse reaction to anesthesia    2 daughters would have N/V  . Glaucoma   . Hearing loss   . Heart murmur   . HTN (hypertension)   . Pneumonia   . Prolapsing mitral leaflet syndrome   . Shortness of breath dyspnea    with exertion  . SVT (supraventricular tachycardia) (HCC)    S/P ablation of AVNRT in 2003   Family History  Problem Relation Age of Onset  . Hypertension Mother   . Pneumonia Father   . Heart attack Neg Hx    Social History   Social History  . Marital status: Widowed    Spouse name: N/A  . Number of children: 3  . Years of education: N/A   Social History Main Topics  . Smoking status: Never Smoker  . Smokeless tobacco: Never Used  . Alcohol use No  . Drug use: No  . Sexual activity: Not Asked   Other Topics Concern  . None   Social History Narrative  . None   ROS: All systems negative other than what is in HPI  Objective:    BP (!) 176/83   Pulse 81   Temp (!) 96.9 F (36.1 C) (Oral)   Ht 5' 2"  (1.575 m)   Wt 113 lb 3.2 oz (51.3  kg)   BMI 20.70 kg/m   Wt Readings from Last 3 Encounters:  06/29/16 113 lb 3.2 oz (51.3 kg)  03/02/16 111 lb (50.3 kg)  02/09/16 114 lb (51.7 kg)    Gen: NAD, alert, cooperative with exam, NCAT EYES: EOMI, no conjunctival injection, or no icterus ENT:   OP without erythema LYMPH: no cervical LAD CV: irregular, normal rate, prominent S1, no murmur, distal pulses 2+ b/l Resp: CTABL, no wheezes, normal WOB Abd: +BS, soft, NTND. no guarding or organomegaly Ext: No edema, warm Neuro: Alert and oriented MSK: normal muscle bulk  Assessment & Plan:  Crystal Cooper was seen today for new patient (initial visit), follow med problems. No complaints today.  Diagnoses and all orders for this visit:  Essential hypertension Elevated, increase from 2.5 to 51m7mmlodipine Check ay home -     BMP8+EGFR -     amLODipine (NORVASC) 5 MG tablet; Take 1 tablet (5 mg total) by mouth daily.  Chronic diastolic congestive heart failure (HCC) wts stable at home euvolemic on exam -     furosemide (LASIX) 40 MG tablet; Take 1.5 tablets (60 mg total)  by mouth daily.  Persistent atrial fibrillation (HCC) Rate controlled -     apixaban (ELIQUIS) 2.5 MG TABS tablet; Take 1 tablet (2.5 mg total) by mouth 2 (two) times daily.  Hyponatremia Recheck labs -     BMP8+EGFR   Follow up plan: Return in about 6 weeks (around 08/10/2016). Assunta Found, MD [place]

## 2016-06-29 NOTE — Patient Instructions (Signed)
Take 5mg  of amlodipine every day Continue to check blood pressures at home Let me know if less than 120 regularly or greater than 150 on top  We will check labs today

## 2016-07-03 ENCOUNTER — Encounter: Payer: Self-pay | Admitting: Cardiology

## 2016-07-03 ENCOUNTER — Telehealth: Payer: Self-pay | Admitting: Cardiology

## 2016-07-03 ENCOUNTER — Ambulatory Visit (INDEPENDENT_AMBULATORY_CARE_PROVIDER_SITE_OTHER): Payer: Medicare Other | Admitting: Cardiology

## 2016-07-03 VITALS — BP 134/60 | HR 86 | Ht 62.0 in | Wt 113.0 lb

## 2016-07-03 DIAGNOSIS — R938 Abnormal findings on diagnostic imaging of other specified body structures: Secondary | ICD-10-CM | POA: Diagnosis not present

## 2016-07-03 DIAGNOSIS — R55 Syncope and collapse: Secondary | ICD-10-CM | POA: Diagnosis not present

## 2016-07-03 DIAGNOSIS — Z952 Presence of prosthetic heart valve: Secondary | ICD-10-CM | POA: Diagnosis not present

## 2016-07-03 DIAGNOSIS — R9389 Abnormal findings on diagnostic imaging of other specified body structures: Secondary | ICD-10-CM

## 2016-07-03 NOTE — Telephone Encounter (Signed)
-----   Message from Cristopher Estimable, RN sent at 07/03/2016  3:47 PM EST ----- Hey dr Stanford Breed saw this patient today and wanted an interrogation of her device. She reported a syncopal episode on 12-25. He is looking for any rhythm disturbances. Can you guys do that from her remote at home?

## 2016-07-03 NOTE — Telephone Encounter (Signed)
LMOVM for pt to return call 

## 2016-07-03 NOTE — Patient Instructions (Addendum)
Medication Instructions:   NO CHANGE  Testing/Procedures:  Your physician has requested that you have an echocardiogram. Echocardiography is a painless test that uses sound waves to create images of your heart. It provides your doctor with information about the size and shape of your heart and how well your heart's chambers and valves are working. This procedure takes approximately one hour. There are no restrictions for this procedure.  Non-Cardiac CT scanning, (CAT scanning), is a noninvasive, special x-ray that produces cross-sectional images of the body using x-rays and a computer. CT scans help physicians diagnose and treat medical conditions. For some CT exams, a contrast material is used to enhance visibility in the area of the body being studied. CT scans provide greater clarity and reveal more details than regular x-ray exams.      Follow-Up:  Your physician wants you to follow-up in: 3 Vici will receive a reminder letter in the mail two months in advance. If you don't receive a letter, please call our office to schedule the follow-up appointment.

## 2016-07-05 NOTE — Telephone Encounter (Signed)
Spoke w/ pt and informed her MD wanted her to send a remote transmission w/ her home monitor. Pt stated that she knows how to send the transmission w/ the monitor and she will do it in the morning.

## 2016-07-06 ENCOUNTER — Ambulatory Visit (HOSPITAL_COMMUNITY): Payer: Medicare Other | Attending: Cardiology

## 2016-07-06 ENCOUNTER — Other Ambulatory Visit: Payer: Self-pay

## 2016-07-06 ENCOUNTER — Ambulatory Visit (INDEPENDENT_AMBULATORY_CARE_PROVIDER_SITE_OTHER)
Admission: RE | Admit: 2016-07-06 | Discharge: 2016-07-06 | Disposition: A | Discharge status: Home / Self Care | Payer: Medicare Other | Source: Ambulatory Visit | Attending: Cardiology | Admitting: Cardiology

## 2016-07-06 DIAGNOSIS — R938 Abnormal findings on diagnostic imaging of other specified body structures: Secondary | ICD-10-CM | POA: Diagnosis not present

## 2016-07-06 DIAGNOSIS — I081 Rheumatic disorders of both mitral and tricuspid valves: Secondary | ICD-10-CM | POA: Insufficient documentation

## 2016-07-06 DIAGNOSIS — J45909 Unspecified asthma, uncomplicated: Secondary | ICD-10-CM | POA: Diagnosis not present

## 2016-07-06 DIAGNOSIS — R9389 Abnormal findings on diagnostic imaging of other specified body structures: Secondary | ICD-10-CM

## 2016-07-06 DIAGNOSIS — R55 Syncope and collapse: Secondary | ICD-10-CM | POA: Diagnosis not present

## 2016-07-06 DIAGNOSIS — I4891 Unspecified atrial fibrillation: Secondary | ICD-10-CM | POA: Diagnosis not present

## 2016-07-06 DIAGNOSIS — I509 Heart failure, unspecified: Secondary | ICD-10-CM | POA: Insufficient documentation

## 2016-07-06 DIAGNOSIS — R918 Other nonspecific abnormal finding of lung field: Secondary | ICD-10-CM | POA: Diagnosis not present

## 2016-07-06 NOTE — Telephone Encounter (Signed)
Dr Stanford Breed aware and no changes made at this time.

## 2016-07-06 NOTE — Telephone Encounter (Signed)
Transmission received. Forward note to device tech RN to review.

## 2016-07-10 ENCOUNTER — Telehealth: Payer: Self-pay | Admitting: Cardiology

## 2016-07-10 NOTE — Telephone Encounter (Signed)
Pt tells me that she was mistaken.  The message was an old message.   Nothing needed at this time. She thanks me following up.

## 2016-07-10 NOTE — Telephone Encounter (Signed)
Crystal Cooper is returning a call .[name] Thanks

## 2016-07-13 ENCOUNTER — Telehealth: Payer: Self-pay | Admitting: Cardiology

## 2016-07-13 NOTE — Telephone Encounter (Signed)
Pt saw Dr Stanford Breed on 1-0-18,daughter says she have some questions.

## 2016-07-13 NOTE — Telephone Encounter (Signed)
Follow up   Pt daughter returned phone call. Stated she accidentally hit the button on the phone

## 2016-07-13 NOTE — Telephone Encounter (Signed)
Attempt to return call-no answer, lmtcb. 

## 2016-07-13 NOTE — Telephone Encounter (Signed)
Returned call, spoke to daughter (ok per DPR)-states patient said she is not suppose to drive for 6 months but this was not indicated on her AVS and wanted to clarify.    Per OV note on 07/04/15 with Dr. Stanford Breed:  (7 syncope-etiology unclear. Question vagal episode. We will have her pacemaker interrogated. Repeat echocardiogram. Patient instructed not to drive for 6 months. No events since Christmas day.)   Daughter verbalized understanding.  Advised this can readdressed at her f/u appointment in 3 months.

## 2016-07-24 DIAGNOSIS — H353131 Nonexudative age-related macular degeneration, bilateral, early dry stage: Secondary | ICD-10-CM | POA: Diagnosis not present

## 2016-07-24 DIAGNOSIS — Z961 Presence of intraocular lens: Secondary | ICD-10-CM | POA: Diagnosis not present

## 2016-07-24 DIAGNOSIS — H43813 Vitreous degeneration, bilateral: Secondary | ICD-10-CM | POA: Diagnosis not present

## 2016-07-24 DIAGNOSIS — H40011 Open angle with borderline findings, low risk, right eye: Secondary | ICD-10-CM | POA: Diagnosis not present

## 2016-07-27 ENCOUNTER — Other Ambulatory Visit: Payer: Self-pay | Admitting: *Deleted

## 2016-07-27 DIAGNOSIS — I350 Nonrheumatic aortic (valve) stenosis: Secondary | ICD-10-CM

## 2016-08-09 ENCOUNTER — Ambulatory Visit (INDEPENDENT_AMBULATORY_CARE_PROVIDER_SITE_OTHER): Payer: Medicare Other | Admitting: *Deleted

## 2016-08-09 ENCOUNTER — Telehealth: Payer: Self-pay | Admitting: Cardiology

## 2016-08-09 DIAGNOSIS — R001 Bradycardia, unspecified: Secondary | ICD-10-CM | POA: Diagnosis not present

## 2016-08-09 NOTE — Progress Notes (Signed)
Remote pacemaker transmission.   

## 2016-08-09 NOTE — Telephone Encounter (Signed)
Informed patient that remote transmission was received successfully.

## 2016-08-09 NOTE — Telephone Encounter (Signed)
New message    Pt calling to see if her transmission came through.

## 2016-08-10 ENCOUNTER — Ambulatory Visit (INDEPENDENT_AMBULATORY_CARE_PROVIDER_SITE_OTHER): Payer: Medicare Other | Admitting: Pediatrics

## 2016-08-10 ENCOUNTER — Encounter: Payer: Self-pay | Admitting: Pediatrics

## 2016-08-10 ENCOUNTER — Encounter: Payer: Self-pay | Admitting: *Deleted

## 2016-08-10 VITALS — BP 126/68 | HR 82 | Temp 96.8°F | Ht 62.0 in | Wt 112.4 lb

## 2016-08-10 DIAGNOSIS — Z23 Encounter for immunization: Secondary | ICD-10-CM | POA: Diagnosis not present

## 2016-08-10 DIAGNOSIS — I10 Essential (primary) hypertension: Secondary | ICD-10-CM | POA: Diagnosis not present

## 2016-08-10 NOTE — Progress Notes (Signed)
  Subjective:   Patient ID: Crystal Cooper, female    DOB: 01/31/1927, 83 y.o.   MRN: [id number] CC: Follow-up (Blood pressure)  HPI: Crystal Cooper is a 83 y.o. female presenting for Follow-up (Blood pressure)  HTN: at home has been 120s/80s since restarting amlodipine No lightheadedness or dizzyness Taking meds regularly No CP, no palpitations  Relevant past medical, surgical, family and social history reviewed. Allergies and medications reviewed and updated. History  Smoking Status  . Never Smoker  Smokeless Tobacco  . Never Used   ROS: Per HPI   Objective:    BP 126/68   Pulse 82   Temp (!) 96.8 F (36 C) (Oral)   Ht 5\' 2"  (1.575 m)   Wt 112 lb 6.4 oz (51 kg)   BMI 20.56 kg/m   Wt Readings from Last 3 Encounters:  08/10/16 112 lb 6.4 oz (51 kg)  07/03/16 113 lb (51.3 kg)  06/29/16 113 lb 3.2 oz (51.3 kg)    Gen: NAD, alert, cooperative with exam, NCAT EYES: EOMI, no conjunctival injection, or no icterus CV: NRRR, normal S1/S2, no murmur Resp: CTABL, no wheezes, normal WOB Abd: +BS, soft, NTND. no guarding or organomegaly Ext: No edema, warm Neuro: Alert and oriented  Assessment & Plan:  Gloriann was seen today for follow-up BP.  Diagnoses and all orders for this visit:  Essential hypertension Improved BP Cont to check at home Cont current meds  Need for vaccination against Streptococcus pneumoniae using pneumococcal conjugate vaccine 7 -     Pneumococcal conjugate vaccine 13-valent   Follow up plan: Return in about 6 months (around 02/07/2017). Assunta Found, MD [place]

## 2016-08-14 ENCOUNTER — Encounter: Payer: Self-pay | Admitting: Cardiology

## 2016-08-15 ENCOUNTER — Telehealth: Payer: Self-pay | Admitting: Pediatrics

## 2016-08-15 NOTE — Telephone Encounter (Signed)
Line busy x 2 jkp 2/21

## 2016-08-16 ENCOUNTER — Ambulatory Visit (INDEPENDENT_AMBULATORY_CARE_PROVIDER_SITE_OTHER): Payer: Medicare Other | Admitting: *Deleted

## 2016-08-16 DIAGNOSIS — Z23 Encounter for immunization: Secondary | ICD-10-CM

## 2016-08-17 LAB — CUP PACEART REMOTE DEVICE CHECK
Battery Voltage: 3.01 V
Brady Statistic AS VS Percent: 1.85 %
Brady Statistic RA Percent Paced: 14.15 %
Implantable Lead Implant Date: 20170503
Implantable Lead Implant Date: 20170503
Implantable Lead Location: 753860
Implantable Lead Model: 5076
Implantable Pulse Generator Implant Date: 20170503
Lead Channel Impedance Value: 323 Ohm
Lead Channel Impedance Value: 380 Ohm
Lead Channel Pacing Threshold Amplitude: 0.75 V
Lead Channel Pacing Threshold Amplitude: 0.875 V
Lead Channel Pacing Threshold Pulse Width: 0.4 ms
Lead Channel Pacing Threshold Pulse Width: 0.4 ms
Lead Channel Sensing Intrinsic Amplitude: 6 mV
Lead Channel Sensing Intrinsic Amplitude: 6 mV
Lead Channel Setting Pacing Amplitude: 2 V
MDC IDC LEAD LOCATION: 753859
MDC IDC MSMT BATTERY REMAINING LONGEVITY: 98 mo
MDC IDC MSMT LEADCHNL RA IMPEDANCE VALUE: 456 Ohm
MDC IDC MSMT LEADCHNL RV IMPEDANCE VALUE: 456 Ohm
MDC IDC MSMT LEADCHNL RV SENSING INTR AMPL: 14.75 mV
MDC IDC MSMT LEADCHNL RV SENSING INTR AMPL: 14.75 mV
MDC IDC SESS DTM: 20180215153841
MDC IDC SET LEADCHNL RV PACING AMPLITUDE: 2.5 V
MDC IDC SET LEADCHNL RV PACING PULSEWIDTH: 0.4 ms
MDC IDC SET LEADCHNL RV SENSING SENSITIVITY: 2.8 mV
MDC IDC STAT BRADY AP VP PERCENT: 14.76 %
MDC IDC STAT BRADY AP VS PERCENT: 0.02 %
MDC IDC STAT BRADY AS VP PERCENT: 83.37 %
MDC IDC STAT BRADY RV PERCENT PACED: 97.45 %

## 2016-08-17 NOTE — Telephone Encounter (Signed)
Spoke with patient, appointment schedule for 08/21/16 at 2:00 pm.

## 2016-08-21 ENCOUNTER — Ambulatory Visit (INDEPENDENT_AMBULATORY_CARE_PROVIDER_SITE_OTHER): Payer: Medicare Other

## 2016-08-21 ENCOUNTER — Other Ambulatory Visit: Payer: Self-pay | Admitting: Pediatrics

## 2016-08-21 DIAGNOSIS — Z780 Asymptomatic menopausal state: Secondary | ICD-10-CM

## 2016-08-27 ENCOUNTER — Encounter: Payer: Self-pay | Admitting: Pharmacist

## 2016-08-27 ENCOUNTER — Ambulatory Visit (INDEPENDENT_AMBULATORY_CARE_PROVIDER_SITE_OTHER): Payer: Medicare Other | Admitting: Pharmacist

## 2016-08-27 VITALS — Ht 61.25 in | Wt 112.0 lb

## 2016-08-27 DIAGNOSIS — M810 Age-related osteoporosis without current pathological fracture: Secondary | ICD-10-CM | POA: Diagnosis not present

## 2016-08-27 MED ORDER — RISEDRONATE SODIUM 150 MG PO TABS: 150.0000 mg | ORAL_TABLET | ORAL | 11 refills | Status: DC

## 2016-08-27 MED ORDER — CALCIUM CARBONATE-VITAMIN D 500-200 MG-UNIT PO TABS: 1.0000 | ORAL_TABLET | Freq: Every day | ORAL | Status: DC

## 2016-08-27 NOTE — Progress Notes (Signed)
Patient ID: Crystal Cooper, female   DOB: March 03, 1927, 83 y.o.   MRN: [id number]     HPI: Crystal Cooper is here today to discuss DEXA results and discuss possible treatment for osteoporosis  Back Pain?  Yes       Kyphosis?  Yes  - Max adult height = 5'3 "  Current height = 5' 1.25"  Prior fracture?  Yes - right wrist in 1977 (83yo) - fell on ice Med(s) for Osteoporosis/Osteopenia:  none Med(s) previously tried for Osteoporosis/Osteopenia:  none                                                             PMH: Age at menopause:  83 yo Hysterectomy?  No Oophorectomy?  No HRT? No Steroid Use?  No Thyroid med?  No History of cancer?  No History of digestive disorders (ie Crohn's)?  No Current or previous eating disorders?  No Last Vitamin D Result: never checked Last GFR Result:  Over 60 (09.01.217)   FH/SH: Family history of osteoporosis?  No Parent with history of hip fracture?  No Family history of breast cancer?  No Exercise?  No Smoking?  No Alcohol?  No Denies falling over the last 12 months Uses cane to ambulate    Calcium Assessment Calcium Intake  # of servings/day  Calcium mg  Milk (8 oz) 0.5  x  300  = 150mg   Yogurt (4 oz) 0 x  200 = 0  Cheese (1 oz) 1 x  200 = 200mg   Other Calcium sources   250mg   Ca supplement MVI = 400mg    Estimated calcium intake per day 1000mg     DEXA Results Date of Test T-Score for AP Spine L1-L4 T-Score for Total Left Hip T-Score left radius 33%  08/21/2016 -3.5 -3.9 -3.3                  Assessment: osteoporosis  Recommendations: 1.  Discussed Forteo, Reclast, Prolia and oral bisphosphonates. Pros and con dicussed with patient.  Recommended Forteo because of bone building effects but patient refused because is a daily injectiona d she is concerned about cost / coverage gap. Patient will start Risendronate 150mg  monthly as long as labs checked today are WNL. 2.  recommend calcium 1200mg  daily through supplementation or diet.  3.   recommend weight bearing exercise as able - continue to use cane to ambulate 4.  Counseled and educated about fall risk and prevention. 5.   Orders Placed This Encounter  Procedures  . VITAMIN D 25 Hydroxy (Vit-D Deficiency, Fractures)  . PTH, Intact and Calcium  . Magnesium  . Phosphorus    Recheck DEXA:  2 years  Time spent counseling patient:  30 minutes

## 2016-08-27 NOTE — Patient Instructions (Signed)
Recommend starting risendronate / Actonel 150mg  - take 1 tablet once a month.  Take first thing in the morning before you eat or take other medications.  Take with at least 4 ounces of water and then wait to eat or take other medications for 30 minutes.    Also increase calcium intake - recommend either take a calcium supplement 500mg  1 tablet or increase calcium rich foods such as low fat milk, low fast cheese, yogurt or greens (collard, turnip), salmon.  Fall Prevention in the Home Falls can cause injuries and can affect people from all age groups. There are many simple things that you can do to make your home safe and to help prevent falls. What can I do on the outside of my home?  Regularly repair the edges of walkways and driveways and fix any cracks.  Remove high doorway thresholds.  Trim any shrubbery on the main path into your home.  Use bright outdoor lighting.  Clear walkways of debris and clutter, including tools and rocks.  Regularly check that handrails are securely fastened and in good repair. Both sides of any steps should have handrails.  Install guardrails along the edges of any raised decks or porches.  Have leaves, snow, and ice cleared regularly.  Use sand or salt on walkways during winter months.  In the garage, clean up any spills right away, including grease or oil spills. What can I do in the bathroom?  Use night lights.  Install grab bars by the toilet and in the tub and shower. Do not use towel bars as grab bars.  Use non-skid mats or decals on the floor of the tub or shower.  If you need to sit down while you are in the shower, use a plastic, non-slip stool.  Keep the floor dry. Immediately clean up any water that spills on the floor.  Remove soap buildup in the tub or shower on a regular basis.  Attach bath mats securely with double-sided non-slip rug tape.  Remove throw rugs and other tripping hazards from the floor. What can I do in the  bedroom?  Use night lights.  Make sure that a bedside light is easy to reach.  Do not use oversized bedding that drapes onto the floor.  Have a firm chair that has side arms to use for getting dressed.  Remove throw rugs and other tripping hazards from the floor. What can I do in the kitchen?  Clean up any spills right away.  Avoid walking on wet floors.  Place frequently used items in easy-to-reach places.  If you need to reach for something above you, use a sturdy step stool that has a grab bar.  Keep electrical cables out of the way.  Do not use floor polish or wax that makes floors slippery. If you have to use wax, make sure that it is non-skid floor wax.  Remove throw rugs and other tripping hazards from the floor. What can I do in the stairways?  Do not leave any items on the stairs.  Make sure that there are handrails on both sides of the stairs. Fix handrails that are broken or loose. Make sure that handrails are as long as the stairways.  Check any carpeting to make sure that it is firmly attached to the stairs. Fix any carpet that is loose or worn.  Avoid having throw rugs at the top or bottom of stairways, or secure the rugs with carpet tape to prevent them from moving.  Make sure that you have a light switch at the top of the stairs and the bottom of the stairs. If you do not have them, have them installed. What are some other fall prevention tips?  Wear closed-toe shoes that fit well and support your feet. Wear shoes that have rubber soles or low heels.  When you use a stepladder, make sure that it is completely opened and that the sides are firmly locked. Have someone hold the ladder while you are using it. Do not climb a closed stepladder.  Add color or contrast paint or tape to grab bars and handrails in your home. Place contrasting color strips on the first and last steps.  Use mobility aids as needed, such as canes, walkers, scooters, and  crutches.  Turn on lights if it is dark. Replace any light bulbs that burn out.  Set up furniture so that there are clear paths. Keep the furniture in the same spot.  Fix any uneven floor surfaces.  Choose a carpet design that does not hide the edge of steps of a stairway.  Be aware of any and all pets.  Review your medicines with your healthcare provider. Some medicines can cause dizziness or changes in blood pressure, which increase your risk of falling. Talk with your health care provider about other ways that you can decrease your risk of falls. This may include working with a physical therapist or trainer to improve your strength, balance, and endurance. This information is not intended to replace advice given to you by your health care provider. Make sure you discuss any questions you have with your health care provider. Document Released: 06/01/2002 Document Revised: 11/08/2015 Document Reviewed: 07/16/2014 Elsevier Interactive Patient Education  2017 [employer].

## 2016-08-28 LAB — PTH, INTACT AND CALCIUM
CALCIUM: 9.9 mg/dL (ref 8.7–10.3)
PTH: 13 pg/mL — ABNORMAL LOW (ref 15–65)

## 2016-08-28 LAB — MAGNESIUM: Magnesium: 2.4 mg/dL — ABNORMAL HIGH (ref 1.6–2.3)

## 2016-08-28 LAB — VITAMIN D 25 HYDROXY (VIT D DEFICIENCY, FRACTURES): Vit D, 25-Hydroxy: 41.3 ng/mL (ref 30.0–100.0)

## 2016-08-28 LAB — PHOSPHORUS: PHOSPHORUS: 3.9 mg/dL (ref 2.5–4.5)

## 2016-08-30 ENCOUNTER — Telehealth: Payer: Self-pay | Admitting: Pediatrics

## 2016-08-31 NOTE — Telephone Encounter (Signed)
Lab results showed low PTH, normal calcium and normal vitamin D.   Patient states that she did take 1 dose of risendronate and is feeling like it might be hurting her stomach.   Since PTH was low suggest starting Forto 39mcg sq qd.  Patient is agreeable to try and will come in 09/06/16 for instruction and to receive sample.

## 2016-09-06 ENCOUNTER — Ambulatory Visit (INDEPENDENT_AMBULATORY_CARE_PROVIDER_SITE_OTHER): Payer: Medicare Other | Admitting: Pharmacist

## 2016-09-06 DIAGNOSIS — M810 Age-related osteoporosis without current pathological fracture: Secondary | ICD-10-CM

## 2016-09-06 MED ORDER — INSULIN PEN NEEDLE 32G X 4 MM MISC: 0 refills | Status: DC

## 2016-09-06 MED ORDER — TERIPARATIDE (RECOMBINANT) 600 MCG/2.4ML ~~LOC~~ SOLN: 20.0000 ug | Freq: Every day | SUBCUTANEOUS | 0 refills | Status: DC

## 2016-09-07 NOTE — Progress Notes (Signed)
Patient ID: TOMMIE DEJOSEPH, female   DOB: 1926-12-18, 82 y.o.   MRN: [id number]   Patient ID: MEITAL RIEHL, female   DOB: 30-Sep-1926, 83 y.o.   MRN: [id number]     HPI: Mrs. Belknap is here today to start Forteo.  I saw patient about 2 weeks ago to review DEXA.  We discussed many options and at the time she wanted to start oral bisphoophonate.  However labs show low PTH and since patients T-Scores were very low after discussion with her PCP it wa decided to try Forteo at least for 3 months.   Back Pain?  Yes       Kyphosis?  Yes  - Max adult height = 5'3 "  Current height = 5' 1.25"  Prior fracture?  Yes - right wrist in 1977 (83yo) - fell on ice Med(s) for Osteoporosis/Osteopenia:  risendronate 150mg  monthly Med(s) previously tried for Osteoporosis/Osteopenia:  none                                                             PMH: Age at menopause:  83 yo Hysterectomy?  No Oophorectomy?  No HRT? No Steroid Use?  No Thyroid med?  No History of cancer?  No History of digestive disorders (ie Crohn's)?  No Current or previous eating disorders?  No Last Vitamin D Result: 41.3 (08/27/2016) PTH = 13 (low) Magnesium = 2.4 (slightly elevated but has been higher in past) Phosphorus = 3.9 (normal) Last GFR Result:  Over 60 (09.01.217)   FH/SH: Family history of osteoporosis?  No Parent with history of hip fracture?  No Family history of breast cancer?  No Exercise?  No Smoking?  No Alcohol?  No Denies falling over the last 12 months Uses cane to ambulate    Calcium Assessment Calcium Intake  # of servings/day  Calcium mg  Milk (8 oz) 0.5  x  300  = 150mg   Yogurt (4 oz) 0 x  200 = 0  Cheese (1 oz) 1 x  200 = 200mg   Other Calcium sources   250mg   Ca supplement MVI = 400mg    Estimated calcium intake per day 1000mg     DEXA Results Date of Test T-Score for AP Spine L1-L4 T-Score for Total Left Hip T-Score left radius 33%  08/21/2016 -3.5 -3.9 -3.3                   Assessment: osteoporosis  Recommendations: 1.  Forteo 5mcg SQ daily started today - educated patient on proper administration, storage and side effects to monitor for.  First injection given in office today. 2.  recommend calcium 1200mg  daily through supplementation or diet.  3.  recommend weight bearing exercise as able - continue to use cane to ambulate 4.  Counseled and educated about fall risk and prevention. 5.   No orders of the defined types were placed in this encounter.   Recheck DEXA:  3 -6 months  Time spent counseling patient:  40 minutes

## 2016-10-24 ENCOUNTER — Other Ambulatory Visit: Payer: Self-pay | Admitting: Pediatrics

## 2016-10-24 DIAGNOSIS — I10 Essential (primary) hypertension: Secondary | ICD-10-CM

## 2016-11-02 ENCOUNTER — Other Ambulatory Visit: Payer: Self-pay

## 2016-11-02 ENCOUNTER — Ambulatory Visit (HOSPITAL_COMMUNITY): Payer: Medicare Other | Attending: Cardiology

## 2016-11-02 ENCOUNTER — Ambulatory Visit (INDEPENDENT_AMBULATORY_CARE_PROVIDER_SITE_OTHER): Payer: Medicare Other | Admitting: Cardiovascular Disease

## 2016-11-02 ENCOUNTER — Encounter: Payer: Self-pay | Admitting: Cardiovascular Disease

## 2016-11-02 VITALS — BP 126/72 | HR 72 | Ht 61.0 in | Wt 111.1 lb

## 2016-11-02 DIAGNOSIS — I471 Supraventricular tachycardia: Secondary | ICD-10-CM | POA: Insufficient documentation

## 2016-11-02 DIAGNOSIS — Z48812 Encounter for surgical aftercare following surgery on the circulatory system: Secondary | ICD-10-CM | POA: Insufficient documentation

## 2016-11-02 DIAGNOSIS — I350 Nonrheumatic aortic (valve) stenosis: Secondary | ICD-10-CM | POA: Diagnosis not present

## 2016-11-02 DIAGNOSIS — I110 Hypertensive heart disease with heart failure: Secondary | ICD-10-CM | POA: Insufficient documentation

## 2016-11-02 DIAGNOSIS — I4891 Unspecified atrial fibrillation: Secondary | ICD-10-CM | POA: Diagnosis not present

## 2016-11-02 DIAGNOSIS — Z953 Presence of xenogenic heart valve: Secondary | ICD-10-CM | POA: Insufficient documentation

## 2016-11-02 DIAGNOSIS — Z952 Presence of prosthetic heart valve: Secondary | ICD-10-CM | POA: Diagnosis not present

## 2016-11-02 DIAGNOSIS — J45909 Unspecified asthma, uncomplicated: Secondary | ICD-10-CM | POA: Insufficient documentation

## 2016-11-02 DIAGNOSIS — I509 Heart failure, unspecified: Secondary | ICD-10-CM | POA: Insufficient documentation

## 2016-11-02 NOTE — Progress Notes (Signed)
Valve Clinic Note  No chief complaint on file.   History of Present Illness: 83 yo female with history of severe aortic valve stenosis now one year s/p TAVR, HTN, persistent atrial fibrillation on Eliquis, asthma, SVT, chronic diastolic CHF here today for one year TAVR follow up. She underwent TAVR on 10/25/15 with a 29 mm Edwards Sapien 3 bioprosthetic valve from the transfemoral approach. She had persistent bradycardia following the procedure with baseline atrial fib. A permanent pacemaker was implanted 10/26/15 by Dr. Lennie Odor.  She had cardioversion in September 2017.   She is here today for follow up. The patient denies any chest pain, dyspnea, palpitations, lower extremity edema, orthopnea, PND, dizziness, near syncope or syncope.   Primary Care Physician: Eustaquio Maize, MD  Primary Cardiology: Stanford Breed   Past Medical History:  Diagnosis Date  . Anemia    years ago  . Aortic stenosis   . Arthritis   . Asthma   . Atrial fibrillation ([place])   . CHF (congestive heart failure) ([place]) 11/2014  . Family history of adverse reaction to anesthesia    2 daughters would have N/V  . Glaucoma   . Hearing loss   . Heart murmur   . HTN (hypertension)   . Pneumonia   . Prolapsing mitral leaflet syndrome   . Shortness of breath dyspnea    with exertion  . SVT (supraventricular tachycardia) ([facility])    S/P ablation of AVNRT in 2003    Past Surgical History:  Procedure Laterality Date  . APPENDECTOMY    . BLADDER SURGERY    . CARDIAC CATHETERIZATION N/A 09/26/2015   Procedure: Right/Left Heart Cath and Coronary Angiography;  Surgeon: Burnell Blanks, MD;  Location: [facility];  Service: Cardiovascular;  Laterality: N/A;  . CARDIAC SURGERY    . CARDIOVERSION N/A 03/02/2016   Procedure: CARDIOVERSION;  Surgeon: Thayer Headings, MD;  Location: [facility];  Service: Cardiovascular;  Laterality: N/A;  . EP IMPLANTABLE DEVICE N/A 10/26/2015   Procedure: Pacemaker Implant;  Surgeon:  Will Meredith Leeds, MD;  Location: [facility];  Service: Cardiovascular;  Laterality: N/A;  . EYE SURGERY Bilateral    cataract surgery  . NASAL SINUS SURGERY    . TEE WITHOUT CARDIOVERSION N/A 10/25/2015   Procedure: TRANSESOPHAGEAL ECHOCARDIOGRAM (TEE);  Surgeon: Burnell Blanks, MD;  Location: [facility];  Service: Open Heart Surgery;  Laterality: N/A;  . TRANSCATHETER AORTIC VALVE REPLACEMENT, TRANSFEMORAL Right 10/25/2015   Procedure: TRANSCATHETER AORTIC VALVE REPLACEMENT, TRANSFEMORAL;  Surgeon: Burnell Blanks, MD;  Location: [facility];  Service: Open Heart Surgery;  Laterality: Right;    Current Outpatient Prescriptions  Medication Sig Dispense Refill  . albuterol (PROVENTIL HFA;VENTOLIN HFA) 108 (90 BASE) MCG/ACT inhaler Inhale 2 puffs into the lungs every 4 (four) hours as needed for wheezing or shortness of breath.     [name] amLODipine (NORVASC) 5 MG tablet TAKE ONE TABLET BY MOUTH ONCE DAILY 30 tablet 3  . apixaban (ELIQUIS) 2.5 MG TABS tablet Take 1 tablet (2.5 mg total) by mouth 2 (two) times daily. 180 tablet 3  . calcium-vitamin D (OSCAL WITH D) 500-200 MG-UNIT tablet Take 1 tablet by mouth daily.    . furosemide (LASIX) 40 MG tablet Take 1.5 tablets (60 mg total) by mouth daily. 45 tablet 6  . Insulin Pen Needle 32G X 4 MM MISC Use one pen needle daily with Forteo pen 100 each 0  . multivitamin-iron-minerals-folic acid (CENTRUM) chewable tablet Chew 1 tablet  by mouth daily.    . Teriparatide, Recombinant, 600 MCG/2.4ML SOLN Inject 0.08 mLs (20 mcg total) into the skin daily. 7.2 mL 0   No current facility-administered medications for this visit.     Allergies  Allergen Reactions  . Hctz [Hydrochlorothiazide] Other (See Comments)    Pt was ill and affected kidneys   . Codeine Other (See Comments)    Unknown  Made pt feel ill, has not had any problems since 1977    Social History   Social History  . Marital status: Widowed    Spouse name: N/A  . Number of  children: 3  . Years of education: N/A   Occupational History  . Not on file.   Social History Main Topics  . Smoking status: Never Smoker  . Smokeless tobacco: Never Used  . Alcohol use No  . Drug use: No  . Sexual activity: No   Other Topics Concern  . Not on file   Social History Narrative  . No narrative on file    Family History  Problem Relation Age of Onset  . Hypertension Mother   . Pneumonia Father   . Heart attack Neg Hx     Review of Systems:  As stated in the HPI and otherwise negative.   BP 126/72   Pulse 72   Ht 5\' 1"  (1.549 m)   Wt 111 lb 1.9 oz (50.4 kg)   SpO2 98%   BMI 21.00 kg/m   Physical Examination: General: Well developed, well nourished, NAD  HEENT: OP clear, mucus membranes moist  SKIN: warm, dry. No rashes. Neuro: No focal deficits  Musculoskeletal: Muscle strength 5/5 all ext  Psychiatric: Mood and affect normal  Neck: No JVD, no carotid bruits, no thyromegaly, no lymphadenopathy.  Lungs:Clear bilaterally, no wheezes, rhonci, crackles Cardiovascular: Regular rate and rhythm. No murmurs, gallops or rubs. Abdomen:Soft. Bowel sounds present. Non-tender.  Extremities: No lower extremity edema. Pulses are 2 + in the bilateral DP/PT.   Echo  Preliminary Read: Normal LV function AVR working well, no AI  EKG:  EKG is not ordered today   Recent Labs: 02/24/2016: Hemoglobin 11.9; Platelets 207 06/29/2016: BUN 16; Creatinine, Ser 0.88; Potassium 4.1; Sodium 136 08/27/2016: Magnesium 2.4   Lipid Panel No results found for: CHOL, TRIG, HDL, CHOLHDL, VLDL, LDLCALC, LDLDIRECT   Wt Readings from Last 3 Encounters:  11/02/16 111 lb 1.9 oz (50.4 kg)  08/27/16 112 lb (50.8 kg)  08/10/16 112 lb 6.4 oz (51 kg)    Other studies Reviewed: Additional studies/ records that were reviewed today include: Echo images are personally reviewed by me. Review of the above records demonstrates:   Assessment and Plan:   1. Severe aortic valve stenosis s/p  TAVR: She is one year s/p TAVR. She is on Eliquis. She is NYHA class 1. Echo today with normally functioning AVR with no AI. No further valve clinic f/u.     Current medicines are reviewed at length with the patient today.  The patient does not have concerns regarding medicines.  The following changes have been made:  no change  Labs/ tests ordered today include:  No orders of the defined types were placed in this encounter.   Disposition:   FU with Dr. Stanford Breed as planned.     Signed, Lauree Chandler, MD 11/02/2016 1:30 PM    [facility] [address], [place], [place]  [address] Phone: [phone]; Fax: [phone]

## 2016-11-02 NOTE — Patient Instructions (Signed)
Medication Instructions:  Your physician recommends that you continue on your current medications as directed. Please refer to the Current Medication list given to you today.   Labwork: none  Testing/Procedures: none  Follow-Up: Follow up with Dr.Crenshaw and Dr. Curt Bears as planned.     Any Other Special Instructions Will Be Listed Below (If Applicable).     If you need a refill on your cardiac medications before your next appointment, please call your pharmacy.

## 2016-11-05 NOTE — Progress Notes (Signed)
HPI: FU AVR. History of ablation of AVNRT in 2003. Echocardiogram repeated February 2017 and showed normal LV systolic function, severe aortic stenosis with mean gradient 43 mmHg. Cath 2/17 showed no significant CAD. Had TAVR 5/17. Had PM 5/17. Also with afib/flutter. Had DCCV 03/02/16. Echocardiogram May 2018 showed normal LV systolic function, bioprosthetic aortic valve with normal function, biatrial enlargement and mild right ventricular enlargement. Since last seen, the patient has dyspnea with more extreme activities but not with routine activities. It is relieved with rest. It is not associated with chest pain. There is no orthopnea, PND or pedal edema. There is no syncope or palpitations. There is no exertional chest pain.   Current Outpatient Prescriptions  Medication Sig Dispense Refill  . acetaminophen (TYLENOL) 500 MG tablet Take 500 mg by mouth every 6 (six) hours as needed for mild pain.    [name] albuterol (PROVENTIL HFA;VENTOLIN HFA) 108 (90 BASE) MCG/ACT inhaler Inhale 2 puffs into the lungs every 4 (four) hours as needed for wheezing or shortness of breath.     [name] amLODipine (NORVASC) 5 MG tablet TAKE ONE TABLET BY MOUTH ONCE DAILY 30 tablet 3  . apixaban (ELIQUIS) 2.5 MG TABS tablet Take 1 tablet (2.5 mg total) by mouth 2 (two) times daily. 180 tablet 3  . calcium-vitamin D (OSCAL WITH D) 500-200 MG-UNIT tablet Take 1 tablet by mouth daily.    . furosemide (LASIX) 40 MG tablet Take 1.5 tablets (60 mg total) by mouth daily. 45 tablet 6  . Insulin Pen Needle 32G X 4 MM MISC Use one pen needle daily with Forteo pen 100 each 0  . multivitamin-iron-minerals-folic acid (CENTRUM) chewable tablet Chew 1 tablet by mouth daily.    . Teriparatide, Recombinant, 600 MCG/2.4ML SOLN Inject 0.08 mLs (20 mcg total) into the skin daily. 7.2 mL 0   No current facility-administered medications for this visit.      Past Medical History:  Diagnosis Date  . Anemia    years ago  . Aortic stenosis    . Arthritis   . Asthma   . Atrial fibrillation ([place])   . CHF (congestive heart failure) ([place]) 11/2014  . Family history of adverse reaction to anesthesia    2 daughters would have N/V  . Glaucoma   . Hearing loss   . Heart murmur   . HTN (hypertension)   . Pneumonia   . Prolapsing mitral leaflet syndrome   . Shortness of breath dyspnea    with exertion  . SVT (supraventricular tachycardia) ([facility])    S/P ablation of AVNRT in 2003    Past Surgical History:  Procedure Laterality Date  . APPENDECTOMY    . BLADDER SURGERY    . CARDIAC CATHETERIZATION N/A 09/26/2015   Procedure: Right/Left Heart Cath and Coronary Angiography;  Surgeon: Burnell Blanks, MD;  Location: [facility];  Service: Cardiovascular;  Laterality: N/A;  . CARDIAC SURGERY    . CARDIOVERSION N/A 03/02/2016   Procedure: CARDIOVERSION;  Surgeon: Thayer Headings, MD;  Location: [facility];  Service: Cardiovascular;  Laterality: N/A;  . EP IMPLANTABLE DEVICE N/A 10/26/2015   Procedure: Pacemaker Implant;  Surgeon: Will Meredith Leeds, MD;  Location: [facility];  Service: Cardiovascular;  Laterality: N/A;  . EYE SURGERY Bilateral    cataract surgery  . NASAL SINUS SURGERY    . TEE WITHOUT CARDIOVERSION N/A 10/25/2015   Procedure: TRANSESOPHAGEAL ECHOCARDIOGRAM (TEE);  Surgeon: Burnell Blanks, MD;  Location: [facility];  Service: Open Heart Surgery;  Laterality: N/A;  . TRANSCATHETER AORTIC VALVE REPLACEMENT, TRANSFEMORAL Right 10/25/2015   Procedure: TRANSCATHETER AORTIC VALVE REPLACEMENT, TRANSFEMORAL;  Surgeon: Burnell Blanks, MD;  Location: [facility];  Service: Open Heart Surgery;  Laterality: Right;    Social History   Social History  . Marital status: Widowed    Spouse name: N/A  . Number of children: 3  . Years of education: N/A   Occupational History  . Not on file.   Social History Main Topics  . Smoking status: Never Smoker  . Smokeless tobacco: Never Used  . Alcohol use No  .  Drug use: No  . Sexual activity: No   Other Topics Concern  . Not on file   Social History Narrative  . No narrative on file    Family History  Problem Relation Age of Onset  . Hypertension Mother   . Pneumonia Father   . Heart attack Neg Hx     ROS: no fevers or chills, productive cough, hemoptysis, dysphasia, odynophagia, melena, hematochezia, dysuria, hematuria, rash, seizure activity, orthopnea, PND, pedal edema, claudication. Remaining systems are negative.  Physical Exam: Well-developed frail in no acute distress.  Skin is warm and dry.  HEENT is normal.  Neck is supple.  Chest is clear to auscultation with normal expansion.  Cardiovascular exam is regular rate and rhythm. No murmurs Abdominal exam nontender or distended. No masses palpated. Extremities show no edema. neuro grossly intact   A/P  1 S/P TAVR-continue SBE prophylaxis.  2 status post permanent pacemaker-followed by electrophysiology.  3 history of paroxysmal atrial fibrillation-patient remains in sinus rhythm on examination. Continue apixaban.   4 hypertension-blood pressure is mildly elevated; however typically controlled. We will follow and increase medications as needed.  5 chronic diastolic congestive heart failure-patient appears to be euvolemic. Continue present dose of diuretics.  6 abnormal chest CT-follow-up in January unchanged and no follow-up recommended.  7 syncope-no recurrent episodes. LV function normal.    [name], MD

## 2016-11-08 ENCOUNTER — Ambulatory Visit (INDEPENDENT_AMBULATORY_CARE_PROVIDER_SITE_OTHER): Payer: Medicare Other | Admitting: Cardiology

## 2016-11-08 ENCOUNTER — Encounter: Payer: Self-pay | Admitting: Cardiology

## 2016-11-08 VITALS — BP 154/72 | HR 72 | Ht 61.0 in | Wt 111.8 lb

## 2016-11-08 DIAGNOSIS — Z952 Presence of prosthetic heart valve: Secondary | ICD-10-CM | POA: Diagnosis not present

## 2016-11-08 DIAGNOSIS — I10 Essential (primary) hypertension: Secondary | ICD-10-CM

## 2016-11-08 DIAGNOSIS — I5032 Chronic diastolic (congestive) heart failure: Secondary | ICD-10-CM

## 2016-11-08 DIAGNOSIS — I480 Paroxysmal atrial fibrillation: Secondary | ICD-10-CM

## 2016-11-08 NOTE — Patient Instructions (Signed)
Your physician wants you to follow-up in: 6 MONTHS WITH DR CRENSHAW You will receive a reminder letter in the mail two months in advance. If you don't receive a letter, please call our office to schedule the follow-up appointment.   If you need a refill on your cardiac medications before your next appointment, please call your pharmacy.  

## 2016-11-14 ENCOUNTER — Encounter: Payer: Self-pay | Admitting: Cardiology

## 2016-11-14 ENCOUNTER — Ambulatory Visit (INDEPENDENT_AMBULATORY_CARE_PROVIDER_SITE_OTHER): Payer: Medicare Other | Admitting: Cardiology

## 2016-11-14 VITALS — BP 130/72 | HR 74 | Ht 61.0 in | Wt 110.8 lb

## 2016-11-14 DIAGNOSIS — R001 Bradycardia, unspecified: Secondary | ICD-10-CM

## 2016-11-14 DIAGNOSIS — I480 Paroxysmal atrial fibrillation: Secondary | ICD-10-CM | POA: Diagnosis not present

## 2016-11-14 DIAGNOSIS — Z45018 Encounter for adjustment and management of other part of cardiac pacemaker: Secondary | ICD-10-CM | POA: Diagnosis not present

## 2016-11-14 LAB — CUP PACEART INCLINIC DEVICE CHECK
Date Time Interrogation Session: 20180523152443
Implantable Lead Implant Date: 20170503
Implantable Lead Location: 753859
Implantable Lead Location: 753860
MDC IDC LEAD IMPLANT DT: 20170503
MDC IDC PG IMPLANT DT: 20170503

## 2016-11-14 NOTE — Progress Notes (Signed)
Electrophysiology Office Note   Date:  11/14/2016   ID:  Crystal Cooper, DOB [birth year], MRN [id number]  PCP:  Eustaquio Maize, MD  Cardiologist:  Jerolyn Center Primary Electrophysiologist:  Constance Haw, MD    Chief Complaint  Patient presents with  . Pacemaker Check    Junctional Brady/PAF     History of Present Illness: Crystal Cooper is a 83 y.o. female who presents today for electrophysiology evaluation.   Hx severe aortic valve stenosis, HTN, persistent atrial fibrillation on Eliquis, asthma, SVT, chronic diastolic CHF.  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  Dual chamber pacemaker implanted complicated by pocket hematoma.   Today, denies symptoms of palpitations, chest pain, shortness of breath, orthopnea, PND, lower extremity edema, claudication, dizziness, presyncope, syncope, bleeding, or neurologic sequela. The patient is tolerating medications without difficulties and is otherwise without complaint today. She has been feeling well without complaint. She is able to do all her daily activities without problems. She did have an episode of syncope on Christmas day. She says that she passed out for one second. Her primary physician has changed her medication since then, she has had no further issues.   Past Medical History:  Diagnosis Date  . Anemia    years ago  . Aortic stenosis   . Arthritis   . Asthma   . Atrial fibrillation ([place])   . CHF (congestive heart failure) ([place]) 11/2014  . Family history of adverse reaction to anesthesia    2 daughters would have N/V  . Glaucoma   . Hearing loss   . Heart murmur   . HTN (hypertension)   . Pneumonia   . Prolapsing mitral leaflet syndrome   . Shortness of breath dyspnea    with exertion  . SVT (supraventricular tachycardia) (HCC)    S/P ablation of AVNRT in 2003   Past Surgical History:  Procedure Laterality Date  . APPENDECTOMY    . BLADDER SURGERY    . CARDIAC CATHETERIZATION N/A  09/26/2015   Procedure: Right/Left Heart Cath and Coronary Angiography;  Surgeon: Burnell Blanks, MD;  Location: [facility];  Service: Cardiovascular;  Laterality: N/A;  . CARDIAC SURGERY    . CARDIOVERSION N/A 03/02/2016   Procedure: CARDIOVERSION;  Surgeon: Thayer Headings, MD;  Location: [facility];  Service: Cardiovascular;  Laterality: N/A;  . EP IMPLANTABLE DEVICE N/A 10/26/2015   Procedure: Pacemaker Implant;  Surgeon: Will Meredith Leeds, MD;  Location: [facility];  Service: Cardiovascular;  Laterality: N/A;  . EYE SURGERY Bilateral    cataract surgery  . NASAL SINUS SURGERY    . TEE WITHOUT CARDIOVERSION N/A 10/25/2015   Procedure: TRANSESOPHAGEAL ECHOCARDIOGRAM (TEE);  Surgeon: Burnell Blanks, MD;  Location: [place];  Service: Open Heart Surgery;  Laterality: N/A;  . TRANSCATHETER AORTIC VALVE REPLACEMENT, TRANSFEMORAL Right 10/25/2015   Procedure: TRANSCATHETER AORTIC VALVE REPLACEMENT, TRANSFEMORAL;  Surgeon: Burnell Blanks, MD;  Location: [facility];  Service: Open Heart Surgery;  Laterality: Right;     Current Outpatient Prescriptions  Medication Sig Dispense Refill  . acetaminophen (TYLENOL) 500 MG tablet Take 500 mg by mouth every 6 (six) hours as needed for mild pain.    [name] albuterol (PROVENTIL HFA;VENTOLIN HFA) 108 (90 BASE) MCG/ACT inhaler Inhale 2 puffs into the lungs every 4 (four) hours as needed for wheezing or shortness of breath.     [name] amLODipine (NORVASC) 5 MG tablet TAKE ONE TABLET BY MOUTH ONCE DAILY  30 tablet 3  . apixaban (ELIQUIS) 2.5 MG TABS tablet Take 1 tablet (2.5 mg total) by mouth 2 (two) times daily. 180 tablet 3  . Biotin 10 MG CAPS Take 1 capsule by mouth daily.    . calcium-vitamin D (OSCAL WITH D) 500-200 MG-UNIT tablet Take 1 tablet by mouth daily.    . furosemide (LASIX) 40 MG tablet Take 1.5 tablets (60 mg total) by mouth daily. 45 tablet 6  . Insulin Pen Needle 32G X 4 MM MISC Use one pen needle daily with Forteo pen 100  each 0  . multivitamin-iron-minerals-folic acid (CENTRUM) chewable tablet Chew 1 tablet by mouth daily.    . Teriparatide, Recombinant, 600 MCG/2.4ML SOLN Inject 0.08 mLs (20 mcg total) into the skin daily. 7.2 mL 0   No current facility-administered medications for this visit.     Allergies:   Hctz [hydrochlorothiazide] and Codeine   Social History:  The patient  reports that she has never smoked. She has never used smokeless tobacco. She reports that she does not drink alcohol or use drugs.   Family History:  The patient's family history includes Hypertension in her mother; Pneumonia in her father.    ROS:  Please see the history of present illness.   Otherwise, review of systems is positive for passing out.   All other systems are reviewed and negative.     PHYSICAL EXAM: VS:  BP 130/72   Pulse 74   Ht 5\' 1"  (1.549 m)   Wt 110 lb 12.8 oz (50.3 kg)   SpO2 97%   BMI 20.94 kg/m  , BMI Body mass index is 20.94 kg/m. GEN: Well nourished, well developed, in no acute distress  HEENT: normal  Neck: no JVD, carotid bruits, or masses Cardiac: RRR; no murmurs, rubs, or gallops,no edema  Respiratory:  clear to auscultation bilaterally, normal work of breathing GI: soft, nontender, nondistended, + BS MS: no deformity or atrophy  Skin: warm and dry, device site well healed Neuro:  Strength and sensation are intact Psych: euthymic mood, full affect  EKG:  EKG is not ordered today. Personal review of the ekg ordered 07/03/16 shows A sense, V pace   Personal review of the device interrogation today. Results in [facility]: 02/24/2016: Hemoglobin 11.9; Platelets 207 06/29/2016: BUN 16; Creatinine, Ser 0.88; Potassium 4.1; Sodium 136 08/27/2016: Magnesium 2.4    Lipid Panel  No results found for: CHOL, TRIG, HDL, CHOLHDL, VLDL, LDLCALC, LDLDIRECT   Wt Readings from Last 3 Encounters:  11/14/16 110 lb 12.8 oz (50.3 kg)  11/08/16 111 lb 12.8 oz (50.7 kg)  11/02/16 111 lb  1.9 oz (50.4 kg)      Other studies Reviewed: Additional studies/ records that were reviewed today include: TTE 11/28/15  Review of the above records today demonstrates:  - Left ventricle: The cavity size was normal. Wall thickness was   normal. Systolic function was normal. The estimated ejection   fraction was in the range of 55% to 60%. Indeterminant diastolic   function, atrial fibrillation. Septal bounce consistent with   pacing. - Aortic valve: Bioprosthetic aortic valve s/p TAVR. Mild   perivalvular aortic insufficiency. There was no stenosis. Mean   gradient (S): 5 mm Hg. - Mitral valve: There was moderate regurgitation. - Left atrium: The atrium was moderately dilated. - Right ventricle: The cavity size was normal. Pacer wire or   catheter noted in right ventricle. Systolic function was normal. - Right atrium: The atrium  was mildly dilated. - Tricuspid valve: There was mild-moderate regurgitation. Peak   RV-RA gradient (S): 36 mm Hg. - Pulmonary arteries: PA peak pressure: 39 mm Hg (S). - Inferior vena cava: The vessel was normal in size. The   respirophasic diameter changes were in the normal range (>= 50%),   consistent with normal central venous pressure.   ASSESSMENT AND PLAN:  1.  Atrial fibrillation: Anticoagulated with Eliquis. Has reverted back to sinus rhythm with only minimal amounts of atrial fibrillation. We'll switch her device settings to MVP she is conducting.  This patients CHA2DS2-VASc Score and unadjusted Ischemic Stroke Rate (% per year) is equal to 7.2 % stroke rate/year from a score of 5  Above score calculated as 1 point each if present [CHF, HTN, DM, Vascular=MI/PAD/Aortic Plaque, Age if 65-74, or Female] Above score calculated as 2 points each if present [Age > 75, or Stroke/TIA/TE]   2. 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 She is in sinus rhythm and is conducting currently. Will switch her to  MVP.    Current medicines are reviewed at length with the patient today.   The patient does not have concerns regarding her medicines.  The following changes were made today:  none  Labs/ tests ordered today include:  No orders of the defined types were placed in this encounter.    Disposition:   FU with Will Camnitz 12 months  Signed, Will Meredith Leeds, MD  11/14/2016 2:29 PM     [place] [address] [place] [address] [phone] (office) [phone] (fax)

## 2016-11-14 NOTE — Patient Instructions (Signed)
Medication Instructions:    Your physician recommends that you continue on your current medications as directed. Please refer to the Current Medication list given to you today.  --- If you need a refill on your cardiac medications before your next appointment, please call your pharmacy. ---  Labwork:  None ordered  Testing/Procedures:  None ordered  Follow-Up: Remote monitoring is used to monitor your Pacemaker of ICD from home. This monitoring reduces the number of office visits required to check your device to one time per year. It allows [place] to keep an eye on the functioning of your device to ensure it is working properly. You are scheduled for a device check from home on 02/13/2017. You may send your transmission at any time that day. If you have a wireless device, the transmission will be sent automatically. After your physician reviews your transmission, you will receive a postcard with your next transmission date.   Your physician wants you to follow-up in: 1 year with Dr. Curt Bears.  You will receive a reminder letter in the mail two months in advance. If you don't receive a letter, please call our office to schedule the follow-up appointment.  Thank you for choosing [facility]!!   Trinidad Curet, RN [phone]

## 2016-12-03 ENCOUNTER — Telehealth: Payer: Self-pay | Admitting: Pediatrics

## 2016-12-03 MED ORDER — TERIPARATIDE (RECOMBINANT) 600 MCG/2.4ML ~~LOC~~ SOLN: 20.0000 ug | Freq: Every day | SUBCUTANEOUS | 0 refills | Status: DC

## 2016-12-03 NOTE — Telephone Encounter (Signed)
Will continue Forteo for 6 months total.  Patient given #2 months of samples and will have DEXA rechecked 02/18/2017.

## 2017-01-24 DIAGNOSIS — H353131 Nonexudative age-related macular degeneration, bilateral, early dry stage: Secondary | ICD-10-CM | POA: Diagnosis not present

## 2017-01-24 DIAGNOSIS — H40011 Open angle with borderline findings, low risk, right eye: Secondary | ICD-10-CM | POA: Diagnosis not present

## 2017-01-24 DIAGNOSIS — Z9849 Cataract extraction status, unspecified eye: Secondary | ICD-10-CM | POA: Diagnosis not present

## 2017-01-24 DIAGNOSIS — Z961 Presence of intraocular lens: Secondary | ICD-10-CM | POA: Diagnosis not present

## 2017-02-08 ENCOUNTER — Ambulatory Visit (INDEPENDENT_AMBULATORY_CARE_PROVIDER_SITE_OTHER): Payer: Medicare Other | Admitting: Pediatrics

## 2017-02-08 ENCOUNTER — Encounter: Payer: Self-pay | Admitting: Pediatrics

## 2017-02-08 DIAGNOSIS — I4819 Other persistent atrial fibrillation: Secondary | ICD-10-CM

## 2017-02-08 DIAGNOSIS — I10 Essential (primary) hypertension: Secondary | ICD-10-CM

## 2017-02-08 DIAGNOSIS — I5032 Chronic diastolic (congestive) heart failure: Secondary | ICD-10-CM | POA: Diagnosis not present

## 2017-02-08 DIAGNOSIS — I481 Persistent atrial fibrillation: Secondary | ICD-10-CM

## 2017-02-08 DIAGNOSIS — R7309 Other abnormal glucose: Secondary | ICD-10-CM

## 2017-02-08 MED ORDER — APIXABAN 2.5 MG PO TABS: 2.5000 mg | ORAL_TABLET | Freq: Two times a day (BID) | ORAL | 3 refills | Status: DC

## 2017-02-08 MED ORDER — FUROSEMIDE 40 MG PO TABS: 60.0000 mg | ORAL_TABLET | Freq: Every day | ORAL | 6 refills | Status: DC

## 2017-02-08 MED ORDER — AMLODIPINE BESYLATE 5 MG PO TABS: 5.0000 mg | ORAL_TABLET | Freq: Every day | ORAL | 3 refills | Status: DC

## 2017-02-08 NOTE — Progress Notes (Signed)
  Subjective:   Patient ID: Crystal Cooper, female    DOB: Aug 08, 1926, 83 y.o.   MRN: [id number] CC: Follow-up (6 month) multiple med problems HPI: Crystal Cooper is a 83 y.o. female presenting for Follow-up (6 month)  HTN: checks at home 115-120/60s-70s No CP, no HA Feels better on 5mg  she says Denies lightheadedness, dizziness  Afib: no bleeding, now on eliquis, most recently has been in sinus rhythm  Constipation: takes stool softener as needed  Chronic diastolic HF: No swelling in LE Follows with cardiology  Relevant past medical, surgical, family and social history reviewed. Allergies and medications reviewed and updated. History  Smoking Status  . Never Smoker  Smokeless Tobacco  . Never Used   ROS: Per HPI   Objective:    BP 137/70   Pulse 90   Temp (!) 97.4 F (36.3 C) (Oral)   Ht 5\' 1"  (1.549 m)   Wt 112 lb 12.8 oz (51.2 kg)   BMI 21.31 kg/m   Wt Readings from Last 3 Encounters:  02/08/17 112 lb 12.8 oz (51.2 kg)  11/14/16 110 lb 12.8 oz (50.3 kg)  11/08/16 111 lb 12.8 oz (50.7 kg)    Gen: NAD, alert, cooperative with exam, NCAT EYES: EOMI, no conjunctival injection, or no icterus ENT:  Hearing aids in place, OP without erythema, no distended neck veins CV: NRRR, normal S1/S2 Resp: CTABL, no wheezes, normal WOB Abd: +BS, soft, NTND. no guarding or organomegaly Ext: No edema, warm Neuro: Alert and oriented, uses cane  Assessment & Plan:  Crystal Cooper was seen today for follow-up med problems  Diagnoses and all orders for this visit:  Essential hypertension Adequate control, cont current meds -     amLODipine (NORVASC) 5 MG tablet; Take 1 tablet (5 mg total) by mouth daily. -     Basic Metabolic Panel  h/o atrial fibrillation (HCC) On blood thinners, follows with cardiology -     apixaban (ELIQUIS) 2.5 MG TABS tablet; Take 1 tablet (2.5 mg total) by mouth 2 (two) times daily.  Chronic diastolic congestive heart failure (HCC) euvolemic on exam  today, cont BP management -     furosemide (LASIX) 40 MG tablet; Take 1.5 tablets (60 mg total) by mouth daily.   Follow up plan: Return in about 6 months (around 08/11/2017). Assunta Found, MD [place]

## 2017-02-09 LAB — BASIC METABOLIC PANEL
BUN/Creatinine Ratio: 18 (ref 12–28)
BUN: 20 mg/dL (ref 8–27)
CALCIUM: 8.8 mg/dL (ref 8.7–10.3)
CHLORIDE: 94 mmol/L — AB (ref 96–106)
CO2: 24 mmol/L (ref 20–29)
Creatinine, Ser: 1.12 mg/dL — ABNORMAL HIGH (ref 0.57–1.00)
GFR calc non Af Amer: 44 mL/min/{1.73_m2} — ABNORMAL LOW (ref >59)
GFR, EST AFRICAN AMERICAN: 51 mL/min/{1.73_m2} — AB (ref >59)
GLUCOSE: 136 mg/dL — AB (ref 65–99)
Potassium: 3.6 mmol/L (ref 3.5–5.2)
Sodium: 135 mmol/L (ref 134–144)

## 2017-02-11 ENCOUNTER — Other Ambulatory Visit: Payer: Self-pay | Admitting: *Deleted

## 2017-02-11 DIAGNOSIS — R7309 Other abnormal glucose: Secondary | ICD-10-CM

## 2017-02-12 DIAGNOSIS — R7309 Other abnormal glucose: Secondary | ICD-10-CM | POA: Diagnosis not present

## 2017-02-12 LAB — BAYER DCA HB A1C WAIVED: HB A1C (BAYER DCA - WAIVED): 5.6 % (ref <7.0)

## 2017-02-12 NOTE — Addendum Note (Signed)
Addended by: Earlene Plater on: 02/12/2017 12:17 PM   Modules accepted: Orders

## 2017-02-13 ENCOUNTER — Ambulatory Visit (INDEPENDENT_AMBULATORY_CARE_PROVIDER_SITE_OTHER): Payer: Medicare Other | Admitting: *Deleted

## 2017-02-13 DIAGNOSIS — R001 Bradycardia, unspecified: Secondary | ICD-10-CM | POA: Diagnosis not present

## 2017-02-13 NOTE — Progress Notes (Signed)
Remote pacemaker transmission.   

## 2017-02-18 ENCOUNTER — Other Ambulatory Visit: Payer: Self-pay | Admitting: Pediatrics

## 2017-02-18 ENCOUNTER — Other Ambulatory Visit (INDEPENDENT_AMBULATORY_CARE_PROVIDER_SITE_OTHER): Payer: Medicare Other

## 2017-02-18 DIAGNOSIS — Z09 Encounter for follow-up examination after completed treatment for conditions other than malignant neoplasm: Secondary | ICD-10-CM

## 2017-02-22 ENCOUNTER — Encounter: Payer: Self-pay | Admitting: Cardiology

## 2017-04-02 LAB — CUP PACEART REMOTE DEVICE CHECK
Battery Remaining Longevity: 107 mo
Battery Voltage: 3.02 V
Brady Statistic AP VS Percent: 13.7 %
Brady Statistic RA Percent Paced: 15.46 %
Brady Statistic RV Percent Paced: 4.95 %
Implantable Lead Implant Date: 20170503
Implantable Lead Location: 753859
Implantable Lead Model: 5076
Implantable Lead Model: 5076
Implantable Pulse Generator Implant Date: 20170503
Lead Channel Impedance Value: 304 Ohm
Lead Channel Impedance Value: 437 Ohm
Lead Channel Impedance Value: 437 Ohm
Lead Channel Sensing Intrinsic Amplitude: 3.375 mV
Lead Channel Sensing Intrinsic Amplitude: 9.875 mV
Lead Channel Setting Pacing Amplitude: 2.5 V
Lead Channel Setting Sensing Sensitivity: 2.8 mV
MDC IDC LEAD IMPLANT DT: 20170503
MDC IDC LEAD LOCATION: 753860
MDC IDC MSMT LEADCHNL RA PACING THRESHOLD AMPLITUDE: 0.875 V
MDC IDC MSMT LEADCHNL RA PACING THRESHOLD PULSEWIDTH: 0.4 ms
MDC IDC MSMT LEADCHNL RA SENSING INTR AMPL: 3.375 mV
MDC IDC MSMT LEADCHNL RV IMPEDANCE VALUE: 361 Ohm
MDC IDC MSMT LEADCHNL RV PACING THRESHOLD AMPLITUDE: 1.25 V
MDC IDC MSMT LEADCHNL RV PACING THRESHOLD PULSEWIDTH: 0.4 ms
MDC IDC MSMT LEADCHNL RV SENSING INTR AMPL: 9.875 mV
MDC IDC SESS DTM: 20180822140851
MDC IDC SET LEADCHNL RA PACING AMPLITUDE: 2 V
MDC IDC SET LEADCHNL RV PACING PULSEWIDTH: 0.4 ms
MDC IDC STAT BRADY AP VP PERCENT: 2.12 %
MDC IDC STAT BRADY AS VP PERCENT: 2.56 %
MDC IDC STAT BRADY AS VS PERCENT: 81.62 %

## 2017-04-10 DIAGNOSIS — H353132 Nonexudative age-related macular degeneration, bilateral, intermediate dry stage: Secondary | ICD-10-CM | POA: Diagnosis not present

## 2017-04-10 DIAGNOSIS — H401111 Primary open-angle glaucoma, right eye, mild stage: Secondary | ICD-10-CM | POA: Diagnosis not present

## 2017-04-10 DIAGNOSIS — H40002 Preglaucoma, unspecified, left eye: Secondary | ICD-10-CM | POA: Diagnosis not present

## 2017-05-01 ENCOUNTER — Ambulatory Visit (INDEPENDENT_AMBULATORY_CARE_PROVIDER_SITE_OTHER): Payer: Medicare Other

## 2017-05-01 DIAGNOSIS — Z23 Encounter for immunization: Secondary | ICD-10-CM | POA: Diagnosis not present

## 2017-05-15 ENCOUNTER — Ambulatory Visit (INDEPENDENT_AMBULATORY_CARE_PROVIDER_SITE_OTHER): Payer: Medicare Other | Admitting: *Deleted

## 2017-05-15 DIAGNOSIS — R001 Bradycardia, unspecified: Secondary | ICD-10-CM | POA: Diagnosis not present

## 2017-05-15 NOTE — Progress Notes (Signed)
Remote pacemaker transmission.   

## 2017-05-24 ENCOUNTER — Encounter: Payer: Self-pay | Admitting: Cardiology

## 2017-05-24 NOTE — Progress Notes (Signed)
Letter  

## 2017-06-04 LAB — CUP PACEART REMOTE DEVICE CHECK
Brady Statistic AP VP Percent: 2.48 %
Brady Statistic AP VS Percent: 20.57 %
Brady Statistic AS VP Percent: 6.1 %
Brady Statistic RA Percent Paced: 21.38 %
Date Time Interrogation Session: 20181121145218
Implantable Lead Implant Date: 20170503
Implantable Lead Location: 753859
Implantable Lead Model: 5076
Lead Channel Impedance Value: 361 Ohm
Lead Channel Impedance Value: 418 Ohm
Lead Channel Impedance Value: 437 Ohm
Lead Channel Pacing Threshold Pulse Width: 0.4 ms
Lead Channel Sensing Intrinsic Amplitude: 3.5 mV
Lead Channel Sensing Intrinsic Amplitude: 8.125 mV
Lead Channel Sensing Intrinsic Amplitude: 8.125 mV
Lead Channel Setting Pacing Amplitude: 2 V
Lead Channel Setting Pacing Amplitude: 2.5 V
Lead Channel Setting Pacing Pulse Width: 0.4 ms
Lead Channel Setting Sensing Sensitivity: 2.8 mV
MDC IDC LEAD IMPLANT DT: 20170503
MDC IDC LEAD LOCATION: 753860
MDC IDC MSMT BATTERY REMAINING LONGEVITY: 99 mo
MDC IDC MSMT BATTERY VOLTAGE: 3.02 V
MDC IDC MSMT LEADCHNL RA IMPEDANCE VALUE: 304 Ohm
MDC IDC MSMT LEADCHNL RA PACING THRESHOLD AMPLITUDE: 0.625 V
MDC IDC MSMT LEADCHNL RA PACING THRESHOLD PULSEWIDTH: 0.4 ms
MDC IDC MSMT LEADCHNL RA SENSING INTR AMPL: 3.5 mV
MDC IDC MSMT LEADCHNL RV PACING THRESHOLD AMPLITUDE: 1 V
MDC IDC PG IMPLANT DT: 20170503
MDC IDC STAT BRADY AS VS PERCENT: 70.85 %
MDC IDC STAT BRADY RV PERCENT PACED: 8.67 %

## 2017-06-25 ENCOUNTER — Other Ambulatory Visit: Payer: Self-pay | Admitting: Pediatrics

## 2017-06-25 DIAGNOSIS — I10 Essential (primary) hypertension: Secondary | ICD-10-CM

## 2017-07-19 NOTE — Progress Notes (Signed)
HPI: FU AVR. History of ablation of AVNRT in 2003. Echocardiogram repeated February 2017 and showed normal LV systolic function, severe aortic stenosis with mean gradient 43 mmHg. Cath 2/17 showed no significant CAD. Had TAVR 5/17. Had PM 5/17. Also with afib/flutter. Had DCCV 03/02/16. Echocardiogram May 2018 showed normal LV systolic function, bioprosthetic aortic valve with normal function, biatrial enlargement and mild right ventricular enlargement. Since last seen,  patient denies dyspnea, chest pain, palpitations or syncope.   Current Outpatient Medications  Medication Sig Dispense Refill  . acetaminophen (TYLENOL) 500 MG tablet Take 500 mg by mouth every 6 (six) hours as needed for mild pain.    [name] albuterol (PROVENTIL HFA;VENTOLIN HFA) 108 (90 BASE) MCG/ACT inhaler Inhale 2 puffs into the lungs every 4 (four) hours as needed for wheezing or shortness of breath.     [name] amLODipine (NORVASC) 5 MG tablet Take 1 tablet (5 mg total) by mouth daily. 30 tablet 3  . amLODipine (NORVASC) 5 MG tablet TAKE 1 TABLET BY MOUTH ONCE DAILY 90 tablet 0  . apixaban (ELIQUIS) 2.5 MG TABS tablet Take 1 tablet (2.5 mg total) by mouth 2 (two) times daily. 180 tablet 3  . Biotin 10 MG CAPS Take 1 capsule by mouth daily.    . calcium-vitamin D (OSCAL WITH D) 500-200 MG-UNIT tablet Take 1 tablet by mouth daily.    . furosemide (LASIX) 40 MG tablet Take 1.5 tablets (60 mg total) by mouth daily. 45 tablet 6  . multivitamin-iron-minerals-folic acid (CENTRUM) chewable tablet Chew 1 tablet by mouth daily.     No current facility-administered medications for this visit.      Past Medical History:  Diagnosis Date  . Anemia    years ago  . Aortic stenosis   . Arthritis   . Asthma   . Atrial fibrillation ([place])   . CHF (congestive heart failure) ([place]) 11/2014  . Family history of adverse reaction to anesthesia    2 daughters would have N/V  . Glaucoma   . Hearing loss   . Heart murmur   . HTN  (hypertension)   . Pneumonia   . Prolapsing mitral leaflet syndrome   . Shortness of breath dyspnea    with exertion  . SVT (supraventricular tachycardia) (HCC)    S/P ablation of AVNRT in 2003    Past Surgical History:  Procedure Laterality Date  . APPENDECTOMY    . BLADDER SURGERY    . CARDIAC CATHETERIZATION N/A 09/26/2015   Procedure: Right/Left Heart Cath and Coronary Angiography;  Surgeon: Burnell Blanks, MD;  Location: [facility];  Service: Cardiovascular;  Laterality: N/A;  . CARDIAC SURGERY    . CARDIOVERSION N/A 03/02/2016   Procedure: CARDIOVERSION;  Surgeon: Thayer Headings, MD;  Location: [facility];  Service: Cardiovascular;  Laterality: N/A;  . EP IMPLANTABLE DEVICE N/A 10/26/2015   Procedure: Pacemaker Implant;  Surgeon: Will Meredith Leeds, MD;  Location: [facility];  Service: Cardiovascular;  Laterality: N/A;  . EYE SURGERY Bilateral    cataract surgery  . NASAL SINUS SURGERY    . TEE WITHOUT CARDIOVERSION N/A 10/25/2015   Procedure: TRANSESOPHAGEAL ECHOCARDIOGRAM (TEE);  Surgeon: Burnell Blanks, MD;  Location: [facility];  Service: Open Heart Surgery;  Laterality: N/A;  . TRANSCATHETER AORTIC VALVE REPLACEMENT, TRANSFEMORAL Right 10/25/2015   Procedure: TRANSCATHETER AORTIC VALVE REPLACEMENT, TRANSFEMORAL;  Surgeon: Burnell Blanks, MD;  Location: [facility];  Service: Open Heart Surgery;  Laterality: Right;  Social History   Socioeconomic History  . Marital status: Widowed    Spouse name: Not on file  . Number of children: 3  . Years of education: Not on file  . Highest education level: Not on file  Social Needs  . Financial resource strain: Not on file  . Food insecurity - worry: Not on file  . Food insecurity - inability: Not on file  . Transportation needs - medical: Not on file  . Transportation needs - non-medical: Not on file  Occupational History  . Not on file  Tobacco Use  . Smoking status: Never Smoker  . Smokeless  tobacco: Never Used  Substance and Sexual Activity  . Alcohol use: No    Alcohol/week: 0.0 oz  . Drug use: No  . Sexual activity: No  Other Topics Concern  . Not on file  Social History Narrative  . Not on file    Family History  Problem Relation Age of Onset  . Hypertension Mother   . Pneumonia Father   . Heart attack Neg Hx     ROS: no fevers or chills, productive cough, hemoptysis, dysphasia, odynophagia, melena, hematochezia, dysuria, hematuria, rash, seizure activity, orthopnea, PND, pedal edema, claudication. Remaining systems are negative.  Physical Exam: Well-developed well-nourished in no acute distress.  Skin is warm and dry.  HEENT is normal.  Neck is supple.  Chest is clear to auscultation with normal expansion.  Cardiovascular exam is regular rate and rhythm.  Abdominal exam nontender or distended. No masses palpated. Extremities show no edema. neuro grossly intact  ECG-sinus rhythm with ventricular pacing.  Personally reviewed  A/P  1 status post TAVR-continue SBE prophylaxis.  2 history of paroxysmal atrial fibrillation-patient is in sinus rhythm today.  Continue apixaban.  Check hemoglobin and renal function.  3 hypertension-blood pressure is controlled.  Continue present medications.  4 prior pacemaker-followed by electrophysiology.  5 chronic diastolic congestive heart failure-patient is euvolemic.  Continue present dose of diuretics.  Check potassium and renal function.  [name], MD

## 2017-07-29 ENCOUNTER — Ambulatory Visit (INDEPENDENT_AMBULATORY_CARE_PROVIDER_SITE_OTHER): Payer: Medicare Other | Admitting: Cardiology

## 2017-07-29 ENCOUNTER — Encounter: Payer: Self-pay | Admitting: Cardiology

## 2017-07-29 VITALS — BP 124/61 | HR 71 | Ht 63.0 in | Wt 111.2 lb

## 2017-07-29 DIAGNOSIS — I5032 Chronic diastolic (congestive) heart failure: Secondary | ICD-10-CM

## 2017-07-29 DIAGNOSIS — I10 Essential (primary) hypertension: Secondary | ICD-10-CM

## 2017-07-29 DIAGNOSIS — Z952 Presence of prosthetic heart valve: Secondary | ICD-10-CM

## 2017-07-29 DIAGNOSIS — I480 Paroxysmal atrial fibrillation: Secondary | ICD-10-CM | POA: Diagnosis not present

## 2017-07-29 NOTE — Patient Instructions (Signed)
Medication Instructions:   NO CHANGE  Labwork:  Your physician recommends that you HAVE LAB WORK TODAY  Follow-Up:  Your physician wants you to follow-up in: ONE YEAR WITH DR CRENSHAW You will receive a reminder letter in the mail two months in advance. If you don't receive a letter, please call our office to schedule the follow-up appointment.   If you need a refill on your cardiac medications before your next appointment, please call your pharmacy.    

## 2017-07-30 ENCOUNTER — Encounter: Payer: Self-pay | Admitting: *Deleted

## 2017-07-30 LAB — BASIC METABOLIC PANEL
BUN / CREAT RATIO: 17 (ref 12–28)
BUN: 19 mg/dL (ref 8–27)
CO2: 29 mmol/L (ref 20–29)
CREATININE: 1.14 mg/dL — AB (ref 0.57–1.00)
Calcium: 9.3 mg/dL (ref 8.7–10.3)
Chloride: 95 mmol/L — ABNORMAL LOW (ref 96–106)
GFR calc non Af Amer: 43 mL/min/{1.73_m2} — ABNORMAL LOW (ref >59)
GFR, EST AFRICAN AMERICAN: 50 mL/min/{1.73_m2} — AB (ref >59)
GLUCOSE: 95 mg/dL (ref 65–99)
Potassium: 4.3 mmol/L (ref 3.5–5.2)
Sodium: 140 mmol/L (ref 134–144)

## 2017-07-30 LAB — CBC
HEMOGLOBIN: 11.7 g/dL (ref 11.1–15.9)
Hematocrit: 34.3 % (ref 34.0–46.6)
MCH: 30.9 pg (ref 26.6–33.0)
MCHC: 34.1 g/dL (ref 31.5–35.7)
MCV: 91 fL (ref 79–97)
Platelets: 198 10*3/uL (ref 150–379)
RBC: 3.79 x10E6/uL (ref 3.77–5.28)
RDW: 13.9 % (ref 12.3–15.4)
WBC: 4.7 10*3/uL (ref 3.4–10.8)

## 2017-08-08 ENCOUNTER — Other Ambulatory Visit: Payer: Self-pay | Admitting: Pediatrics

## 2017-08-08 MED ORDER — ALBUTEROL SULFATE HFA 108 (90 BASE) MCG/ACT IN AERS: 2.0000 | INHALATION_SPRAY | RESPIRATORY_TRACT | 0 refills | Status: DC | PRN

## 2017-08-08 NOTE — Telephone Encounter (Signed)
What is the name of the medication? Inhaler, Dr. Marlyn Corporal in [place] prescribed  but he is retired  Have you contacted your pharmacy to request a refill? yes  Which pharmacy would you like this sent to? [facility] [place]   Patient notified that their request is being sent to the clinical staff for review and that they should receive a call once it is complete. If they do not receive a call within 24 hours they can check with their pharmacy or our office.

## 2017-08-08 NOTE — Telephone Encounter (Signed)
Pt aware refill sent to [facility] 

## 2017-08-12 ENCOUNTER — Ambulatory Visit (INDEPENDENT_AMBULATORY_CARE_PROVIDER_SITE_OTHER): Payer: Medicare Other | Admitting: Pediatrics

## 2017-08-12 ENCOUNTER — Encounter: Payer: Self-pay | Admitting: Pediatrics

## 2017-08-12 VITALS — BP 136/68 | HR 75 | Temp 96.6°F | Ht 63.0 in | Wt 112.0 lb

## 2017-08-12 DIAGNOSIS — I481 Persistent atrial fibrillation: Secondary | ICD-10-CM | POA: Diagnosis not present

## 2017-08-12 DIAGNOSIS — J4520 Mild intermittent asthma, uncomplicated: Secondary | ICD-10-CM | POA: Diagnosis not present

## 2017-08-12 DIAGNOSIS — I10 Essential (primary) hypertension: Secondary | ICD-10-CM | POA: Diagnosis not present

## 2017-08-12 DIAGNOSIS — M810 Age-related osteoporosis without current pathological fracture: Secondary | ICD-10-CM

## 2017-08-12 DIAGNOSIS — I4819 Other persistent atrial fibrillation: Secondary | ICD-10-CM

## 2017-08-12 NOTE — Progress Notes (Signed)
  Subjective:   Patient ID: Crystal Cooper, female    DOB: 04-04-1927, 84 y.o.   MRN: [id number] CC: Follow-up (6 month)  HPI: Crystal Cooper is a 84 y.o. female presenting for Follow-up (6 month)  HTN: taking med regularly  Osteoporosis:   Relevant past medical, surgical, family and social history reviewed. Allergies and medications reviewed and updated. Social History   Tobacco Use  Smoking Status Never Smoker  Smokeless Tobacco Never Used   ROS: Per HPI   Objective:    BP 136/68   Pulse 75   Temp (!) 96.6 F (35.9 C) (Oral)   Ht 5\' 3"  (1.6 m)   Wt 112 lb (50.8 kg)   BMI 19.84 kg/m   Wt Readings from Last 3 Encounters:  08/12/17 112 lb (50.8 kg)  07/29/17 111 lb 3.2 oz (50.4 kg)  02/08/17 112 lb 12.8 oz (51.2 kg)    Gen: NAD, alert,  NCAT EYES: EOMI, no conjunctival injection, or no icterus ENT: OP without erythema LYMPH: no cervical LAD CV: NRRR, normal S1/S2 Resp: CTABL, no wheezes, normal WOB Abd: +BS, soft, NTND. no guarding or organomegaly Ext: No edema, warm Neuro: Alert and oriented, strength equal b/l UE and LE, coordination grossly normal MSK: normal muscle bulk  Assessment & Plan:  Crystal Cooper was seen today for follow-up.  Diagnoses and all orders for this visit:  Essential hypertension Stable, cont current meds  Age-related osteoporosis without current pathological fracture Will see if able to get coverage for prolia  Mild intermittent asthma without complication Stable, rarely needs albuterol  Persistent atrial fibrillation ([place]) On eliquis, cont  Follow up plan: Return in about 6 months (around 02/09/2018). Assunta Found, MD [place]

## 2017-08-14 ENCOUNTER — Ambulatory Visit (INDEPENDENT_AMBULATORY_CARE_PROVIDER_SITE_OTHER): Payer: Medicare Other | Admitting: *Deleted

## 2017-08-14 DIAGNOSIS — R001 Bradycardia, unspecified: Secondary | ICD-10-CM | POA: Diagnosis not present

## 2017-08-14 NOTE — Progress Notes (Signed)
Remote pacemaker transmission.   

## 2017-08-15 ENCOUNTER — Encounter: Payer: Self-pay | Admitting: Cardiology

## 2017-08-19 LAB — CUP PACEART REMOTE DEVICE CHECK
Battery Remaining Longevity: 94 mo
Brady Statistic AS VP Percent: 8.59 %
Brady Statistic RA Percent Paced: 13.73 %
Brady Statistic RV Percent Paced: 13.36 %
Date Time Interrogation Session: 20190220151528
Implantable Lead Implant Date: 20170503
Implantable Lead Location: 753859
Implantable Lead Location: 753860
Implantable Lead Model: 5076
Lead Channel Impedance Value: 304 Ohm
Lead Channel Impedance Value: 361 Ohm
Lead Channel Impedance Value: 437 Ohm
Lead Channel Pacing Threshold Pulse Width: 0.4 ms
Lead Channel Sensing Intrinsic Amplitude: 9.125 mV
Lead Channel Sensing Intrinsic Amplitude: 9.125 mV
Lead Channel Setting Pacing Amplitude: 2 V
Lead Channel Setting Pacing Amplitude: 2.5 V
Lead Channel Setting Pacing Pulse Width: 0.4 ms
Lead Channel Setting Sensing Sensitivity: 2.8 mV
MDC IDC LEAD IMPLANT DT: 20170503
MDC IDC MSMT BATTERY VOLTAGE: 3.01 V
MDC IDC MSMT LEADCHNL RA IMPEDANCE VALUE: 418 Ohm
MDC IDC MSMT LEADCHNL RA PACING THRESHOLD AMPLITUDE: 0.75 V
MDC IDC MSMT LEADCHNL RA SENSING INTR AMPL: 4.125 mV
MDC IDC MSMT LEADCHNL RA SENSING INTR AMPL: 4.125 mV
MDC IDC MSMT LEADCHNL RV PACING THRESHOLD AMPLITUDE: 0.875 V
MDC IDC MSMT LEADCHNL RV PACING THRESHOLD PULSEWIDTH: 0.4 ms
MDC IDC PG IMPLANT DT: 20170503
MDC IDC STAT BRADY AP VP PERCENT: 4.52 %
MDC IDC STAT BRADY AP VS PERCENT: 9.59 %
MDC IDC STAT BRADY AS VS PERCENT: 77.3 %

## 2017-09-23 ENCOUNTER — Other Ambulatory Visit: Payer: Self-pay | Admitting: Pediatrics

## 2017-09-23 DIAGNOSIS — I10 Essential (primary) hypertension: Secondary | ICD-10-CM

## 2017-10-07 DIAGNOSIS — H40002 Preglaucoma, unspecified, left eye: Secondary | ICD-10-CM | POA: Diagnosis not present

## 2017-10-07 DIAGNOSIS — H401111 Primary open-angle glaucoma, right eye, mild stage: Secondary | ICD-10-CM | POA: Diagnosis not present

## 2017-10-07 DIAGNOSIS — H353132 Nonexudative age-related macular degeneration, bilateral, intermediate dry stage: Secondary | ICD-10-CM | POA: Diagnosis not present

## 2017-10-23 ENCOUNTER — Other Ambulatory Visit: Payer: Self-pay | Admitting: Pediatrics

## 2017-10-23 DIAGNOSIS — I5032 Chronic diastolic (congestive) heart failure: Secondary | ICD-10-CM

## 2017-11-13 ENCOUNTER — Telehealth: Payer: Self-pay | Admitting: Cardiology

## 2017-11-13 ENCOUNTER — Ambulatory Visit (INDEPENDENT_AMBULATORY_CARE_PROVIDER_SITE_OTHER): Payer: Medicare Other | Admitting: *Deleted

## 2017-11-13 DIAGNOSIS — R001 Bradycardia, unspecified: Secondary | ICD-10-CM

## 2017-11-13 NOTE — Telephone Encounter (Signed)
ERROR/ no note needed/kw

## 2017-11-14 ENCOUNTER — Encounter: Payer: Self-pay | Admitting: Cardiology

## 2017-11-14 NOTE — Progress Notes (Signed)
Remote pacemaker transmission.   

## 2017-11-27 ENCOUNTER — Encounter: Payer: Self-pay | Admitting: Cardiology

## 2017-11-27 ENCOUNTER — Ambulatory Visit (INDEPENDENT_AMBULATORY_CARE_PROVIDER_SITE_OTHER): Payer: Medicare Other | Admitting: Cardiology

## 2017-11-27 ENCOUNTER — Encounter (INDEPENDENT_AMBULATORY_CARE_PROVIDER_SITE_OTHER): Payer: Self-pay

## 2017-11-27 VITALS — BP 120/68 | HR 70 | Ht 62.0 in | Wt 110.0 lb

## 2017-11-27 DIAGNOSIS — I480 Paroxysmal atrial fibrillation: Secondary | ICD-10-CM

## 2017-11-27 DIAGNOSIS — R001 Bradycardia, unspecified: Secondary | ICD-10-CM | POA: Diagnosis not present

## 2017-11-27 LAB — CUP PACEART REMOTE DEVICE CHECK
Battery Remaining Longevity: 93 mo
Battery Voltage: 3.01 V
Brady Statistic AP VS Percent: 12.43 %
Brady Statistic AS VS Percent: 77.13 %
Date Time Interrogation Session: 20190605123628
Implantable Lead Implant Date: 20170503
Implantable Lead Location: 753860
Implantable Lead Model: 5076
Implantable Pulse Generator Implant Date: 20170503
Lead Channel Impedance Value: 304 Ohm
Lead Channel Impedance Value: 418 Ohm
Lead Channel Pacing Threshold Amplitude: 0.875 V
Lead Channel Pacing Threshold Amplitude: 1.5 V
Lead Channel Pacing Threshold Pulse Width: 0.4 ms
Lead Channel Pacing Threshold Pulse Width: 0.4 ms
Lead Channel Sensing Intrinsic Amplitude: 2.25 mV
Lead Channel Setting Sensing Sensitivity: 2.8 mV
MDC IDC LEAD IMPLANT DT: 20170503
MDC IDC LEAD LOCATION: 753859
MDC IDC MSMT LEADCHNL RA SENSING INTR AMPL: 4.25 mV
MDC IDC MSMT LEADCHNL RV IMPEDANCE VALUE: 342 Ohm
MDC IDC MSMT LEADCHNL RV IMPEDANCE VALUE: 437 Ohm
MDC IDC MSMT LEADCHNL RV SENSING INTR AMPL: 10.875 mV
MDC IDC MSMT LEADCHNL RV SENSING INTR AMPL: 9.75 mV
MDC IDC SET LEADCHNL RA PACING AMPLITUDE: 2 V
MDC IDC SET LEADCHNL RV PACING AMPLITUDE: 3 V
MDC IDC SET LEADCHNL RV PACING PULSEWIDTH: 0.4 ms
MDC IDC STAT BRADY AP VP PERCENT: 3.32 %
MDC IDC STAT BRADY AS VP PERCENT: 7.13 %
MDC IDC STAT BRADY RA PERCENT PACED: 15.2 %
MDC IDC STAT BRADY RV PERCENT PACED: 10.68 %

## 2017-11-27 NOTE — Patient Instructions (Signed)
Medication Instructions:  Your physician recommends that you continue on your current medications as directed. Please refer to the Current Medication list given to you today.  *If you need a refill on your cardiac medications before your next appointment, please call your pharmacy*  Labwork: None ordered  Testing/Procedures: None ordered  Follow-Up: Remote monitoring is used to monitor your Pacemaker or ICD from home. This monitoring reduces the number of office visits required to check your device to one time per year. It allows [place] to keep an eye on the functioning of your device to ensure it is working properly. You are scheduled for a device check from home on 02/12/2018. You may send your transmission at any time that day. If you have a wireless device, the transmission will be sent automatically. After your physician reviews your transmission, you will receive a postcard with your next transmission date.  Your physician wants you to follow-up in: 1 year with Dr. Curt Bears.  You will receive a reminder letter in the mail two months in advance. If you don't receive a letter, please call our office to schedule the follow-up appointment.  Thank you for choosing [facility]!!   Trinidad Curet, RN [phone]

## 2017-11-27 NOTE — Progress Notes (Signed)
Electrophysiology Office Note   Date:  11/27/2017   ID:  Crystal Cooper, DOB Sep 17, 1926, MRN [id number]  PCP:  Eustaquio Maize, MD  Cardiologist:  Jerolyn Center Primary Electrophysiologist:  Constance Haw, MD    Chief Complaint  Patient presents with  . Pacemaker Check    Junctional Bradycardia/PAF     History of Present Illness: Crystal Cooper is a 84 y.o. female who presents today for electrophysiology evaluation.   Hx severe aortic valve stenosis, HTN, persistent atrial fibrillation on Eliquis, asthma, SVT, chronic diastolic CHF.  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  Dual chamber pacemaker implanted complicated by pocket hematoma.  Today, denies symptoms of palpitations, chest pain, shortness of breath, orthopnea, PND, lower extremity edema, claudication, dizziness, presyncope, syncope, bleeding, or neurologic sequela. The patient is tolerating medications without difficulties.  Overall she is doing well.  She has no major complaints at this time.  She is tolerating her pacemaker well.  She has had minimal atrial fibrillation.   Past Medical History:  Diagnosis Date  . Anemia    years ago  . Aortic stenosis   . Arthritis   . Asthma   . Atrial fibrillation ([place])   . CHF (congestive heart failure) ([place]) 11/2014  . Family history of adverse reaction to anesthesia    2 daughters would have N/V  . Glaucoma   . Hearing loss   . Heart murmur   . HTN (hypertension)   . Pneumonia   . Prolapsing mitral leaflet syndrome   . Shortness of breath dyspnea    with exertion  . SVT (supraventricular tachycardia) ([facility])    S/P ablation of AVNRT in 2003   Past Surgical History:  Procedure Laterality Date  . APPENDECTOMY    . BLADDER SURGERY    . CARDIAC CATHETERIZATION N/A 09/26/2015   Procedure: Right/Left Heart Cath and Coronary Angiography;  Surgeon: Burnell Blanks, MD;  Location: [facility];  Service: Cardiovascular;  Laterality: N/A;   . CARDIAC SURGERY    . CARDIOVERSION N/A 03/02/2016   Procedure: CARDIOVERSION;  Surgeon: Thayer Headings, MD;  Location: [facility];  Service: Cardiovascular;  Laterality: N/A;  . EP IMPLANTABLE DEVICE N/A 10/26/2015   Procedure: Pacemaker Implant;  Surgeon: Mercedies Ganesh Meredith Leeds, MD;  Location: [facility];  Service: Cardiovascular;  Laterality: N/A;  . EYE SURGERY Bilateral    cataract surgery  . NASAL SINUS SURGERY    . TEE WITHOUT CARDIOVERSION N/A 10/25/2015   Procedure: TRANSESOPHAGEAL ECHOCARDIOGRAM (TEE);  Surgeon: Burnell Blanks, MD;  Location: [facility];  Service: Open Heart Surgery;  Laterality: N/A;  . TRANSCATHETER AORTIC VALVE REPLACEMENT, TRANSFEMORAL Right 10/25/2015   Procedure: TRANSCATHETER AORTIC VALVE REPLACEMENT, TRANSFEMORAL;  Surgeon: Burnell Blanks, MD;  Location: [facility];  Service: Open Heart Surgery;  Laterality: Right;     Current Outpatient Medications  Medication Sig Dispense Refill  . acetaminophen (TYLENOL) 500 MG tablet Take 500 mg by mouth every 6 (six) hours as needed for mild pain.    [name] albuterol (PROVENTIL HFA;VENTOLIN HFA) 108 (90 Base) MCG/ACT inhaler Inhale 2 puffs into the lungs every 4 (four) hours as needed for wheezing or shortness of breath. 18 g 0  . amLODipine (NORVASC) 5 MG tablet Take 1 tablet (5 mg total) by mouth daily. 30 tablet 3  . apixaban (ELIQUIS) 2.5 MG TABS tablet Take 1 tablet (2.5 mg total) by mouth 2 (two) times daily. 180 tablet 3  .  Biotin 10 MG CAPS Take 1 capsule by mouth daily.    . calcium-vitamin D (OSCAL WITH D) 500-200 MG-UNIT tablet Take 1 tablet by mouth daily.    . furosemide (LASIX) 40 MG tablet TAKE 1 & 1/2 (ONE & ONE-HALF) TABLETS BY MOUTH ONCE DAILY 45 tablet 2  . multivitamin-iron-minerals-folic acid (CENTRUM) chewable tablet Chew 1 tablet by mouth daily.     No current facility-administered medications for this visit.     Allergies:   Hctz [hydrochlorothiazide] and Codeine   Social History:  The  patient  reports that she has never smoked. She has never used smokeless tobacco. She reports that she does not drink alcohol or use drugs.   Family History:  The patient's family history includes Hypertension in her mother; Pneumonia in her father.    ROS:  Please see the history of present illness.   Otherwise, review of systems is positive for balance problems.   All other systems are reviewed and negative.   PHYSICAL EXAM: VS:  BP 120/68   Pulse 70   Ht 5\' 2"  (1.575 m)   Wt 110 lb (49.9 kg)   SpO2 95%   BMI 20.12 kg/m  , BMI Body mass index is 20.12 kg/m. GEN: Well nourished, well developed, in no acute distress  HEENT: normal  Neck: no JVD, carotid bruits, or masses Cardiac: RRR; no murmurs, rubs, or gallops,no edema  Respiratory:  clear to auscultation bilaterally, normal work of breathing GI: soft, nontender, nondistended, + BS MS: no deformity or atrophy  Skin: warm and dry, device site well healed Neuro:  Strength and sensation are intact Psych: euthymic mood, full affect  EKG:  EKG is not ordered today. Personal review of the ekg ordered 07/03/17 shows A sense, V paced  Personal review of the device interrogation today. Results in [facility]: 07/29/2017: BUN 19; Creatinine, Ser 1.14; Hemoglobin 11.7; Platelets 198; Potassium 4.3; Sodium 140    Lipid Panel  No results found for: CHOL, TRIG, HDL, CHOLHDL, VLDL, LDLCALC, LDLDIRECT   Wt Readings from Last 3 Encounters:  11/27/17 110 lb (49.9 kg)  08/12/17 112 lb (50.8 kg)  07/29/17 111 lb 3.2 oz (50.4 kg)      Other studies Reviewed: Additional studies/ records that were reviewed today include: TTE 11/28/15  Review of the above records today demonstrates:  - Left ventricle: The cavity size was normal. Wall thickness was   normal. Systolic function was normal. The estimated ejection   fraction was in the range of 55% to 60%. Indeterminant diastolic   function, atrial fibrillation. Septal bounce  consistent with   pacing. - Aortic valve: Bioprosthetic aortic valve s/p TAVR. Mild   perivalvular aortic insufficiency. There was no stenosis. Mean   gradient (S): 5 mm Hg. - Mitral valve: There was moderate regurgitation. - Left atrium: The atrium was moderately dilated. - Right ventricle: The cavity size was normal. Pacer wire or   catheter noted in right ventricle. Systolic function was normal. - Right atrium: The atrium was mildly dilated. - Tricuspid valve: There was mild-moderate regurgitation. Peak   RV-RA gradient (S): 36 mm Hg. - Pulmonary arteries: PA peak pressure: 39 mm Hg (S). - Inferior vena cava: The vessel was normal in size. The   respirophasic diameter changes were in the normal range (>= 50%),   consistent with normal central venous pressure.   ASSESSMENT AND PLAN:  1.  Atrial fibrillation: Early on Eliquis.  She is back in sinus rhythm  and doing well.  She has no major complaints.  Continue current management.  This patients CHA2DS2-VASc Score and unadjusted Ischemic Stroke Rate (% per year) is equal to 7.2 % stroke rate/year from a score of 5  Above score calculated as 1 point each if present [CHF, HTN, DM, Vascular=MI/PAD/Aortic Plaque, Age if 65-74, or Female] Above score calculated as 2 points each if present [Age > 75, or Stroke/TIA/TE]  2. Junctional bradycardia: Status post [employer] dual-chamber pacemaker implanted 10/26/2015.  Device function without issue.  No changes.    Current medicines are reviewed at length with the patient today.   The patient does not have concerns regarding her medicines.  The following changes were made today: None  Labs/ tests ordered today include:  No orders of the defined types were placed in this encounter.    Disposition:   FU with Lucas Winograd 12 months  Signed, Hanson Medeiros Meredith Leeds, MD  11/27/2017 10:57 AM     [facility] [address] [place] [address] [phone]  (office) [phone] (fax)

## 2017-11-30 LAB — CUP PACEART REMOTE DEVICE CHECK
Battery Remaining Longevity: 94 mo
Battery Voltage: 3.02 V
Brady Statistic AS VS Percent: 80.63 %
Brady Statistic RA Percent Paced: 10.16 %
Implantable Lead Implant Date: 20170503
Implantable Lead Implant Date: 20170503
Implantable Lead Location: 753860
Implantable Lead Model: 5076
Implantable Pulse Generator Implant Date: 20170503
Lead Channel Impedance Value: 304 Ohm
Lead Channel Impedance Value: 342 Ohm
Lead Channel Pacing Threshold Amplitude: 0.75 V
Lead Channel Pacing Threshold Amplitude: 1.25 V
Lead Channel Pacing Threshold Pulse Width: 0.4 ms
Lead Channel Pacing Threshold Pulse Width: 0.4 ms
Lead Channel Sensing Intrinsic Amplitude: 3.125 mV
Lead Channel Setting Pacing Amplitude: 2 V
MDC IDC LEAD LOCATION: 753859
MDC IDC MSMT LEADCHNL RA IMPEDANCE VALUE: 399 Ohm
MDC IDC MSMT LEADCHNL RA SENSING INTR AMPL: 3.125 mV
MDC IDC MSMT LEADCHNL RV IMPEDANCE VALUE: 437 Ohm
MDC IDC MSMT LEADCHNL RV SENSING INTR AMPL: 9.625 mV
MDC IDC MSMT LEADCHNL RV SENSING INTR AMPL: 9.625 mV
MDC IDC SESS DTM: 20190522133013
MDC IDC SET LEADCHNL RV PACING AMPLITUDE: 2.75 V
MDC IDC SET LEADCHNL RV PACING PULSEWIDTH: 0.4 ms
MDC IDC SET LEADCHNL RV SENSING SENSITIVITY: 2.8 mV
MDC IDC STAT BRADY AP VP PERCENT: 3.35 %
MDC IDC STAT BRADY AP VS PERCENT: 6.98 %
MDC IDC STAT BRADY AS VP PERCENT: 9.04 %
MDC IDC STAT BRADY RV PERCENT PACED: 12.69 %

## 2017-12-02 ENCOUNTER — Encounter: Payer: Self-pay | Admitting: Family Medicine

## 2017-12-02 ENCOUNTER — Ambulatory Visit (INDEPENDENT_AMBULATORY_CARE_PROVIDER_SITE_OTHER): Payer: Medicare Other | Admitting: Family Medicine

## 2017-12-02 VITALS — BP 139/66 | HR 72 | Temp 96.8°F | Ht 62.0 in | Wt 111.0 lb

## 2017-12-02 DIAGNOSIS — R490 Dysphonia: Secondary | ICD-10-CM

## 2017-12-02 DIAGNOSIS — J069 Acute upper respiratory infection, unspecified: Secondary | ICD-10-CM | POA: Diagnosis not present

## 2017-12-02 DIAGNOSIS — H6121 Impacted cerumen, right ear: Secondary | ICD-10-CM | POA: Diagnosis not present

## 2017-12-02 DIAGNOSIS — B9789 Other viral agents as the cause of diseases classified elsewhere: Secondary | ICD-10-CM | POA: Diagnosis not present

## 2017-12-02 NOTE — Progress Notes (Signed)
Subjective: CC: hoarseness PCP: Eustaquio Maize, MD WIO:[id number] Crystal Cooper is a 84 y.o. female presenting to clinic today for:  1. Hoarseness Patient reports that she had onset of an upper respiratory infection several weeks ago.  She notes that cough has gotten quite a bit better and she has been using peppermints to help with the cough.  Cough is nonproductive.  No hemoptysis.  However, over the last week, she has become hoarse.  She just wanted to get checked out to make sure that everything was okay.  Denies any fevers, chills, rhinorrhea, sinus congestion or headache.  She has been using her albuterol inhaler but no oral antihistamines.   ROS: Per HPI  Allergies  Allergen Reactions  . Hctz [Hydrochlorothiazide] Other (See Comments)    Pt was ill and affected kidneys   . Codeine Other (See Comments)    Unknown  Made pt feel ill, has not had any problems since 1977   Past Medical History:  Diagnosis Date  . Anemia    years ago  . Aortic stenosis   . Arthritis   . Asthma   . Atrial fibrillation ([place])   . CHF (congestive heart failure) ([place]) 11/2014  . Family history of adverse reaction to anesthesia    2 daughters would have N/V  . Glaucoma   . Hearing loss   . Heart murmur   . HTN (hypertension)   . Pneumonia   . Prolapsing mitral leaflet syndrome   . Shortness of breath dyspnea    with exertion  . SVT (supraventricular tachycardia) ([facility])    S/P ablation of AVNRT in 2003    Current Outpatient Medications:  .  acetaminophen (TYLENOL) 500 MG tablet, Take 500 mg by mouth every 6 (six) hours as needed for mild pain., Disp: , Rfl:  .  albuterol (PROVENTIL HFA;VENTOLIN HFA) 108 (90 Base) MCG/ACT inhaler, Inhale 2 puffs into the lungs every 4 (four) hours as needed for wheezing or shortness of breath., Disp: 18 g, Rfl: 0 .  amLODipine (NORVASC) 5 MG tablet, Take 1 tablet (5 mg total) by mouth daily., Disp: 30 tablet, Rfl: 3 .  apixaban (ELIQUIS) 2.5 MG TABS tablet, Take 1  tablet (2.5 mg total) by mouth 2 (two) times daily., Disp: 180 tablet, Rfl: 3 .  Biotin 10 MG CAPS, Take 1 capsule by mouth daily., Disp: , Rfl:  .  calcium-vitamin D (OSCAL WITH D) 500-200 MG-UNIT tablet, Take 1 tablet by mouth daily., Disp: , Rfl:  .  furosemide (LASIX) 40 MG tablet, TAKE 1 & 1/2 (ONE & ONE-HALF) TABLETS BY MOUTH ONCE DAILY, Disp: 45 tablet, Rfl: 2 .  multivitamin-iron-minerals-folic acid (CENTRUM) chewable tablet, Chew 1 tablet by mouth daily., Disp: , Rfl:  Social History   Socioeconomic History  . Marital status: Widowed    Spouse name: Not on file  . Number of children: 3  . Years of education: Not on file  . Highest education level: Not on file  Occupational History  . Not on file  Social Needs  . Financial resource strain: Not on file  . Food insecurity:    Worry: Not on file    Inability: Not on file  . Transportation needs:    Medical: Not on file    Non-medical: Not on file  Tobacco Use  . Smoking status: Never Smoker  . Smokeless tobacco: Never Used  Substance and Sexual Activity  . Alcohol use: No    Alcohol/week: 0.0 oz  . Drug  use: No  . Sexual activity: Never  Lifestyle  . Physical activity:    Days per week: Not on file    Minutes per session: Not on file  . Stress: Not on file  Relationships  . Social connections:    Talks on phone: Not on file    Gets together: Not on file    Attends religious service: Not on file    Active member of club or organization: Not on file    Attends meetings of clubs or organizations: Not on file    Relationship status: Not on file  . Intimate partner violence:    Fear of current or ex partner: Not on file    Emotionally abused: Not on file    Physically abused: Not on file    Forced sexual activity: Not on file  Other Topics Concern  . Not on file  Social History Narrative  . Not on file   Family History  Problem Relation Age of Onset  . Hypertension Mother   . Pneumonia Father   . Heart attack  Neg Hx     Objective: Office vital signs reviewed. BP 139/66   Pulse 72   Temp (!) 96.8 F (36 C) (Oral)   Ht 5\' 2"  (1.575 m)   Wt 111 lb (50.3 kg)   BMI 20.30 kg/m   Physical Examination:  General: Awake, alert, well appearing, No acute distress HEENT: Normal    Neck: No masses palpated. No lymphadenopathy    Ears: L Tympanic membranes intact, normal light reflex, no erythema, no bulging; R TM obscured by cerumen    Eyes: PERRLA, extraocular membranes intact, sclera white    Nose: nasal turbinates moist, no nasal discharge    Throat: moist mucus membranes, no erythema, no tonsillar exudate.  Airway is patent Cardio: regular rate and rhythm, S1S2 heard, no murmurs appreciated Pulm: clear to auscultation bilaterally, no wheezes, rhonchi or rales; normal work of breathing on room air  Assessment/ Plan: 84 y.o. female   1. Viral URI with cough Patient is afebrile and nontoxic-appearing.  I suspect that she is getting over a viral URI.  Hoarseness likely related to cough.  For now, continue supportive care.  Home care instructions reviewed.  Handout was provided.  If hoarseness persists beyond another week, I instructed her to call our office and I will refer her to ear nose and throat for direct visualization of the vocal cords.  2. Hoarseness  3. Impacted cerumen of right ear Irrigation performed today.  Janora Norlander, DO [place] [phone]

## 2017-12-02 NOTE — Patient Instructions (Addendum)
I think the hoarseness is a result from your coughing and sinus drainage.  However, we discussed that if this were to last longer than another week I would like you to call me and I will refer you to the ear nose and throat doctor to look at your vocal cords.   Hoarseness Hoarseness is any abnormal change in your voice.Hoarseness can make it difficult to speak. Your voice may sound raspy, breathy, or strained. Hoarseness is caused by a problem with the vocal cords. The vocal cords are two bands of tissue inside your voice box (larynx). When you speak, your vocal cords move back and forth to create sound. The surfaces of your vocal cords need to be smooth for your voice to sound clear. Swelling or lumps on the vocal cords can cause hoarseness. Common causes of vocal cord problems include:  Upper airway infection.  A long-term cough.  Straining or overusing your voice.  Smoking.  Allergies.  Vocal cord growths.  Stomach acids that flow up from your stomach and irritate your vocal cords (gastroesophageal reflux).  Follow these instructions at home: Watch your condition for any changes. To ease any discomfort that you feel:  Rest your voice. Do not whisper. Whispering can cause muscle strain.  Do not speak in a loud or harsh voice that makes your hoarseness worse.  Do not use any tobacco products, including cigarettes, chewing tobacco, or electronic cigarettes. If you need help quitting, ask your health care provider.  Avoid secondhand smoke.  Do not eat foods that give you heartburn. Heartburn can make gastroesophageal reflux worse.  Do not drink coffee.  Do not drink alcohol.  Drink enough fluids to keep your urine clear or pale yellow.  Use a humidifier if the air in your home is dry.  Contact a health care provider if:  You have hoarseness that lasts longer than 3 weeks.  You almost lose or completelylose your voice for longer than 3 days.  You have pain when you  swallow or try to talk.  You feel a lump in your neck. Get help right away if:  You have trouble swallowing.  You feel as though you are choking when you swallow.  You cough up blood or vomit blood.  You have trouble breathing. This information is not intended to replace advice given to you by your health care provider. Make sure you discuss any questions you have with your health care provider. Document Released: 05/25/2005 Document Revised: 11/17/2015 Document Reviewed: 06/02/2014 Elsevier Interactive Patient Education  [employer].

## 2017-12-10 ENCOUNTER — Ambulatory Visit (INDEPENDENT_AMBULATORY_CARE_PROVIDER_SITE_OTHER): Payer: Medicare Other | Admitting: Family

## 2017-12-10 ENCOUNTER — Encounter: Payer: Self-pay | Admitting: Family

## 2017-12-10 VITALS — BP 137/72 | HR 75 | Temp 97.6°F | Ht 62.0 in | Wt 111.8 lb

## 2017-12-10 DIAGNOSIS — H6121 Impacted cerumen, right ear: Secondary | ICD-10-CM

## 2017-12-10 NOTE — Progress Notes (Signed)
   Subjective:    Patient ID: Crystal Cooper, female    DOB: [birth year], 84 y.o.   MRN: [id number]  Chief Complaint  Patient presents with  . right ear has cerumen    Ear Fullness   There is pain in the right ear. This is a recurrent problem. The problem has been unchanged. The pain is at a severity of 0/10. The patient is experiencing no pain. Associated symptoms include hearing loss.      Review of Systems  HENT: Positive for hearing loss.   All other systems reviewed and are negative.      Objective:   Physical Exam  Constitutional: She is oriented to person, place, and time. She appears well-developed and well-nourished. No distress.  HENT:  Head: Normocephalic and atraumatic.  Left Ear: External ear normal.  Mouth/Throat: Oropharynx is clear and moist.  Right ear cerumen impaction   Eyes: Pupils are equal, round, and reactive to light.  Neck: Normal range of motion. Neck supple. No thyromegaly present.  Cardiovascular: Normal rate, regular rhythm, normal heart sounds and intact distal pulses.  No murmur heard. Pulmonary/Chest: Effort normal and breath sounds normal. No respiratory distress. She has no wheezes.  Abdominal: Soft. Bowel sounds are normal. She exhibits no distension. There is no tenderness.  Musculoskeletal: Normal range of motion. She exhibits edema (trace right ankle). She exhibits no tenderness.  Neurological: She is alert and oriented to person, place, and time. She has normal reflexes. No cranial nerve deficit.  Skin: Skin is warm and dry.  Psychiatric: She has a normal mood and affect. Her behavior is normal. Judgment and thought content normal.  Vitals reviewed.   Water and peroxide mixture used to irrigate right ear by nurse. Cerumen removed and TM WNL  BP 137/72   Pulse 75   Temp 97.6 F (36.4 C) (Oral)   Ht 5\' 2"  (1.575 m)   Wt 111 lb 12.8 oz (50.7 kg)   BMI 20.45 kg/m      Assessment & Plan:  Crystal Cooper was seen today for right ear has  cerumen.  Diagnoses and all orders for this visit:  Right ear impacted cerumen  Do not stick anything into ear Ear drops OTC as needed for wax RTO as needed  Evelina Dun, FNP

## 2017-12-10 NOTE — Patient Instructions (Signed)
Earwax Buildup, Adult The ears produce a substance called earwax that helps keep bacteria out of the ear and protects the skin in the ear canal. Occasionally, earwax can build up in the ear and cause discomfort or hearing loss. What increases the risk? This condition is more likely to develop in people who:  Are female.  Are elderly.  Naturally produce more earwax.  Clean their ears often with cotton swabs.  Use earplugs often.  Use in-ear headphones often.  Wear hearing aids.  Have narrow ear canals.  Have earwax that is overly thick or sticky.  Have eczema.  Are dehydrated.  Have excess hair in the ear canal.  What are the signs or symptoms? Symptoms of this condition include:  Reduced or muffled hearing.  A feeling of fullness in the ear or feeling that the ear is plugged.  Fluid coming from the ear.  Ear pain.  Ear itch.  Ringing in the ear.  Coughing.  An obvious piece of earwax that can be seen inside the ear canal.  How is this diagnosed? This condition may be diagnosed based on:  Your symptoms.  Your medical history.  An ear exam. During the exam, your health care provider will look into your ear with an instrument called an otoscope.  You may have tests, including a hearing test. How is this treated? This condition may be treated by:  Using ear drops to soften the earwax.  Having the earwax removed by a health care provider. The health care provider may: ? Flush the ear with water. ? Use an instrument that has a loop on the end (curette). ? Use a suction device.  Surgery to remove the wax buildup. This may be done in severe cases.  Follow these instructions at home:  Take over-the-counter and prescription medicines only as told by your health care provider.  Do not put any objects, including cotton swabs, into your ear. You can clean the opening of your ear canal with a washcloth or facial tissue.  Follow instructions from your health  care provider about cleaning your ears. Do not over-clean your ears.  Drink enough fluid to keep your urine clear or pale yellow. This will help to thin the earwax.  Keep all follow-up visits as told by your health care provider. If earwax builds up in your ears often or if you use hearing aids, consider seeing your health care provider for routine, preventive ear cleanings. Ask your health care provider how often you should schedule your cleanings.  If you have hearing aids, clean them according to instructions from the manufacturer and your health care provider. Contact a health care provider if:  You have ear pain.  You develop a fever.  You have blood, pus, or other fluid coming from your ear.  You have hearing loss.  You have ringing in your ears that does not go away.  Your symptoms do not improve with treatment.  You feel like the room is spinning (vertigo). Summary  Earwax can build up in the ear and cause discomfort or hearing loss.  The most common symptoms of this condition include reduced or muffled hearing and a feeling of fullness in the ear or feeling that the ear is plugged.  This condition may be diagnosed based on your symptoms, your medical history, and an ear exam.  This condition may be treated by using ear drops to soften the earwax or by having the earwax removed by a health care provider.  Do   not put any objects, including cotton swabs, into your ear. You can clean the opening of your ear canal with a washcloth or facial tissue. This information is not intended to replace advice given to you by your health care provider. Make sure you discuss any questions you have with your health care provider. Document Released: 07/19/2004 Document Revised: 08/22/2016 Document Reviewed: 08/22/2016 Elsevier Interactive Patient Education  2018 [employer]vier Inc[employer]  

## 2017-12-19 DIAGNOSIS — H401111 Primary open-angle glaucoma, right eye, mild stage: Secondary | ICD-10-CM | POA: Diagnosis not present

## 2018-01-10 ENCOUNTER — Telehealth: Payer: Self-pay | Admitting: *Deleted

## 2018-01-10 NOTE — Telephone Encounter (Signed)
   Researched coverage with both medicare and and supplement. Covered 100%. Patient would not owe anything for Prolia. Discussed with patient and she said that she will consider it. She has an appointment in August with Dr Evette Doffing and prefers to wait until then to start it if she decides to. Advised that we have to order the mediation ahead of time but that we try to keep one in stock. She can call and come in sooner if she changes her mind. Patient stated understanding.

## 2018-01-23 ENCOUNTER — Other Ambulatory Visit: Payer: Self-pay | Admitting: Pediatrics

## 2018-01-23 DIAGNOSIS — I5032 Chronic diastolic (congestive) heart failure: Secondary | ICD-10-CM

## 2018-01-24 ENCOUNTER — Other Ambulatory Visit: Payer: Self-pay | Admitting: Pediatrics

## 2018-01-24 DIAGNOSIS — I5032 Chronic diastolic (congestive) heart failure: Secondary | ICD-10-CM

## 2018-02-05 ENCOUNTER — Encounter: Payer: Self-pay | Admitting: *Deleted

## 2018-02-05 ENCOUNTER — Ambulatory Visit (INDEPENDENT_AMBULATORY_CARE_PROVIDER_SITE_OTHER): Payer: Medicare Other | Admitting: *Deleted

## 2018-02-05 VITALS — BP 139/75 | HR 76 | Ht 62.0 in | Wt 111.0 lb

## 2018-02-05 DIAGNOSIS — Z0000 Encounter for general adult medical examination without abnormal findings: Secondary | ICD-10-CM

## 2018-02-05 DIAGNOSIS — Z23 Encounter for immunization: Secondary | ICD-10-CM

## 2018-02-05 NOTE — Progress Notes (Signed)
Subjective:   Crystal Cooper is a 84 y.o. female who presents for an Initial Medicare Annual Wellness Visit.  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  She enjoys traveling as tolerated and reading.  Crystal Cooper has 2 daughters, and 1 daughter who is deceased, and 2 grandsons.  She is widowed and lives alone.  She sees her daughters frequently as they provide transportation for her to appointments and grocery store.  Crystal Cooper states that she is feeling increasingly stronger since having a valve replacement in 2017, and pacemaker placement.  She is able to do more walking than she has been in the past few years.  She reports no hospitalizations, ER visits, or surgeries in the past year.  She feels her health this year is better than it was last year.    Review of Systems    All negative today         Objective:    Today's Vitals   02/05/18 0918  BP: 139/75  Pulse: 76  Weight: 111 lb (50.3 kg)  Height: 5\' 2"  (1.575 m)  PainSc: 0-No pain   Body mass index is 20.3 kg/m.  Advanced Directives 02/05/2018 03/02/2016 10/26/2015 10/21/2015 10/10/2015 09/26/2015 12/12/2014  Does Patient Have a Medical Advance Directive? Yes Yes No No No No No  Type of Paramedic of [place];Living will [place]  Does patient want to make changes to medical advance directive? No - Patient declined - - - - - -  Copy of [place] in Chart? No - copy requested No - copy requested - - - - -  Would patient like information on creating a medical advance directive? - - No - patient declined information No - patient declined information No - patient declined information No - patient declined information No - patient declined information    Current Medications (verified) Outpatient Encounter Medications as of 02/05/2018  Medication Sig  . acetaminophen (TYLENOL) 500 MG tablet Take 500 mg by mouth every 6 (six) hours as needed  for mild pain.  [name] albuterol (PROVENTIL HFA;VENTOLIN HFA) 108 (90 Base) MCG/ACT inhaler Inhale 2 puffs into the lungs every 4 (four) hours as needed for wheezing or shortness of breath.  [name] amLODipine (NORVASC) 5 MG tablet Take 1 tablet (5 mg total) by mouth daily.  [name] apixaban (ELIQUIS) 2.5 MG TABS tablet Take 1 tablet (2.5 mg total) by mouth 2 (two) times daily.  . Biotin 10 MG CAPS Take 1 capsule by mouth daily.  . calcium-vitamin D (OSCAL WITH D) 500-200 MG-UNIT tablet Take 1 tablet by mouth daily.  . furosemide (LASIX) 40 MG tablet TAKE 1 & 1/2 (ONE & ONE-HALF) TABLETS BY MOUTH ONCE DAILY  . multivitamin-iron-minerals-folic acid (CENTRUM) chewable tablet Chew 1 tablet by mouth daily.   No facility-administered encounter medications on file as of 02/05/2018.     Allergies (verified) Hctz [hydrochlorothiazide] and Codeine   History: Past Medical History:  Diagnosis Date  . Anemia    years ago  . Aortic stenosis   . Arthritis   . Asthma   . Atrial fibrillation ([place])   . CHF (congestive heart failure) ([place]) 11/2014  . Family history of adverse reaction to anesthesia    2 daughters would have N/V  . Glaucoma   . Hearing loss   . Heart murmur   . HTN (hypertension)   . Pneumonia   . Prolapsing  mitral leaflet syndrome   . Shortness of breath dyspnea    with exertion  . SVT (supraventricular tachycardia) (HCC)    S/P ablation of AVNRT in 2003   Past Surgical History:  Procedure Laterality Date  . APPENDECTOMY    . BLADDER SURGERY    . CARDIAC CATHETERIZATION N/A 09/26/2015   Procedure: Right/Left Heart Cath and Coronary Angiography;  Surgeon: Burnell Blanks, MD;  Location: [facility];  Service: Cardiovascular;  Laterality: N/A;  . CARDIAC SURGERY    . CARDIOVERSION N/A 03/02/2016   Procedure: CARDIOVERSION;  Surgeon: Thayer Headings, MD;  Location: [facility];  Service: Cardiovascular;  Laterality: N/A;  . EP IMPLANTABLE DEVICE N/A 10/26/2015   Procedure: Pacemaker  Implant;  Surgeon: Will Meredith Leeds, MD;  Location: [facility];  Service: Cardiovascular;  Laterality: N/A;  . EYE SURGERY Bilateral    cataract surgery  . NASAL SINUS SURGERY    . TEE WITHOUT CARDIOVERSION N/A 10/25/2015   Procedure: TRANSESOPHAGEAL ECHOCARDIOGRAM (TEE);  Surgeon: Burnell Blanks, MD;  Location: [facility];  Service: Open Heart Surgery;  Laterality: N/A;  . TRANSCATHETER AORTIC VALVE REPLACEMENT, TRANSFEMORAL Right 10/25/2015   Procedure: TRANSCATHETER AORTIC VALVE REPLACEMENT, TRANSFEMORAL;  Surgeon: Burnell Blanks, MD;  Location: [facility];  Service: Open Heart Surgery;  Laterality: Right;   Family History  Problem Relation Age of Onset  . Hypertension Mother   . Pneumonia Father   . Heart attack Neg Hx    Social History   Socioeconomic History  . Marital status: Widowed    Spouse name: Not on file  . Number of children: 3  . Years of education: Not on file  . Highest education level: Not on file  Occupational History  . Not on file  Social Needs  . Financial resource strain: Not on file  . Food insecurity:    Worry: Not on file    Inability: Not on file  . Transportation needs:    Medical: Not on file    Non-medical: Not on file  Tobacco Use  . Smoking status: Never Smoker  . Smokeless tobacco: Never Used  Substance and Sexual Activity  . Alcohol use: No    Alcohol/week: 0.0 standard drinks  . Drug use: No  . Sexual activity: Never  Lifestyle  . Physical activity:    Days per week: Not on file    Minutes per session: Not on file  . Stress: Not on file  Relationships  . Social connections:    Talks on phone: Not on file    Gets together: Not on file    Attends religious service: Not on file    Active member of club or organization: Not on file    Attends meetings of clubs or organizations: Not on file    Relationship status: Not on file  Other Topics Concern  . Not on file  Social History Narrative  . Not on file    Tobacco  Counseling Counseling given: No   Clinical Intake:     Pain Score: 0-No pain                  Activities of Daily Living In your present state of health, do you have any difficulty performing the following activities: 02/05/2018  Hearing? Y  Comment Wears bilateral hearing aids  Vision? N  Difficulty concentrating or making decisions? N  Walking or climbing stairs? Y  Comment Avoids climbing stairs   Dressing or bathing?  N  Doing errands, shopping? N  Preparing Food and eating ? N  Using the Toilet? N  In the past six months, have you accidently leaked urine? N  Do you have problems with loss of bowel control? N  Managing your Medications? N  Managing your Finances? N  Housekeeping or managing your Housekeeping? N  Some recent data might be hidden     Immunizations and Health Maintenance Immunization History  Administered Date(s) Administered  . Influenza, High Dose Seasonal PF 03/26/2015, 05/01/2017  . Influenza-Unspecified 04/26/2016  . Pneumococcal Conjugate-13 08/10/2016  . Pneumococcal Polysaccharide-23 04/10/1996  . Tdap 08/16/2016  . Zoster Recombinat (Shingrix) 02/05/2018   Health Maintenance Due  Topic Date Due  . INFLUENZA VACCINE  01/23/2018    Patient Care Team: Eustaquio Maize, MD as PCP - General (Pediatrics) Ander Slade, Carlisle Beers, MD as Referring Physician (Ophthalmology) Stanford Breed Denice Bors, MD as Consulting Physician (Cardiology) Constance Haw, MD as Consulting Physician (Cardiology)       Assessment:   This is a routine wellness examination for [place].  Hearing/Vision screen Patient wears hearing aids bilaterally States she sees well with her glasses.  She is seen regularly by Dr. Ander Slade to follow up on glaucoma.     Dietary issues and exercise activities discussed:  Ms. Mode states she eats 3 meals per day and snacks as needed.  Usually has cereal and milk for breakfast, a sandwich for lunch, and vegetables for dinner.   Recommended a diet of mostly lean protein, vegetables, fruits, and lean proteins.  Healthy cooking options - baked, broiled, grilled, sauteed.     Current Exercise Habits: Home exercise routine, Type of exercise: walking, Time (Minutes): 30, Frequency (Times/Week): 3, Weekly Exercise (Minutes/Week): 90, Intensity: Mild, Exercise limited by: cardiac condition(s)  Patient states she is feeling much stronger since valve replacement in 2017.  She is able to walk around her block, and wants to work on increasing her endurance by gradually increasing her walking time.   Goals    . Exercise 3x per week (30 min per time)     Increase walking time by 15 minutes per day      Depression Screen PHQ 2/9 Scores 02/05/2018 12/10/2017 12/02/2017 08/12/2017 02/08/2017 08/10/2016 06/29/2016  PHQ - 2 Score 1 0 0 0 0 0 0    Fall Risk Fall Risk  02/05/2018 12/10/2017 12/02/2017 08/12/2017 02/08/2017  Falls in the past year? No No No No No    Is the patient's home free of loose throw rugs in walkways, pet beds, electrical cords, etc?   no      Grab bars in the bathroom? yes      Handrails on the stairs?   yes      Adequate lighting?   yes    Cognitive Function: MMSE - Mini Mental State Exam 02/05/2018  Orientation to time 5  Orientation to Place 5  Registration 3  Attention/ Calculation 5  Recall 3  Language- name 2 objects 2  Language- repeat 0  Language- follow 3 step command 3  Language- read & follow direction 1  Write a sentence 1  Copy design 1  Total score 29        Screening Tests Health Maintenance  Topic Date Due  . INFLUENZA VACCINE  01/23/2018  . TETANUS/TDAP  08/16/2026  . DEXA SCAN  Completed  . PNA vac Low Risk Adult  Completed    Qualifies for Shingles Vaccine? Yes, given today  Cancer Screenings: Lung:  Low Dose CT Chest recommended if Age 62-80 years, 30 pack-year currently smoking OR have quit w/in 15years. Patient does not qualify. Breast: Up to date on Mammogram? Not  indicated   Up to date of Bone Density/Dexa? Yes Colorectal: Not indicated  Additional Screenings:  Hepatitis C Screening:  Not indicated     Plan:     Work on goal of increasing your walking by 15-20 minutes per day.  Shingrix vaccine given today, 2nd dose needed after 04/07/18. Bring copy of  Advance Directives to office to be filed in medical record.   I have personally reviewed and noted the following in the patient's chart:   . Medical and social history . Use of alcohol, tobacco or illicit drugs  . Current medications and supplements . Functional ability and status . Nutritional status . Physical activity . Advanced directives . List of other physicians . Hospitalizations, surgeries, and ER visits in previous 12 months . Vitals . Screenings to include cognitive, depression, and falls . Referrals and appointments  In addition, I have reviewed and discussed with patient certain preventive protocols, quality metrics, and best practice recommendations. A written personalized care plan for preventive services as well as general preventive health recommendations were provided to patient.     Bettejane Leavens M, RN   02/05/2018    I have reviewed and agree with the above AWV documentation.   Assunta Found, MD [facility] 02/06/2018, 1:29 PM

## 2018-02-05 NOTE — Patient Instructions (Addendum)
Please work on your goal of increasing your walking by 15-20 minutes per day.   You received your Shingles vaccine today, you will need to 2nd dose after 04/07/18.   Thank you for coming in for your Annual Wellness Visit today!   Preventive Care 22 Years and Older, Female Preventive care refers to lifestyle choices and visits with your health care provider that can promote health and wellness. What does preventive care include?  A yearly physical exam. This is also called an annual well check.  Dental exams once or twice a year.  Routine eye exams. Ask your health care provider how often you should have your eyes checked.  Personal lifestyle choices, including: ? Daily care of your teeth and gums. ? Regular physical activity. ? Eating a healthy diet. ? Avoiding tobacco and drug use. ? Limiting alcohol use. ? Practicing safe sex. ? Taking low-dose aspirin every day. ? Taking vitamin and mineral supplements as recommended by your health care provider. What happens during an annual well check? The services and screenings done by your health care provider during your annual well check will depend on your age, overall health, lifestyle risk factors, and family history of disease. Counseling Your health care provider may ask you questions about your:  Alcohol use.  Tobacco use.  Drug use.  Emotional well-being.  Home and relationship well-being.  Sexual activity.  Eating habits.  History of falls.  Memory and ability to understand (cognition).  Work and work Statistician.  Reproductive health.  Screening You may have the following tests or measurements:  Height, weight, and BMI.  Blood pressure.  Lipid and cholesterol levels. These may be checked every 5 years, or more frequently if you are over 85 years old.  Skin check.  Lung cancer screening. You may have this screening every year starting at age 58 if you have a 30-pack-year history of smoking and  currently smoke or have quit within the past 15 years.  Fecal occult blood test (FOBT) of the stool. You may have this test every year starting at age 49.  Flexible sigmoidoscopy or colonoscopy. You may have a sigmoidoscopy every 5 years or a colonoscopy every 10 years starting at age 21.  Hepatitis C blood test.  Hepatitis B blood test.  Sexually transmitted disease (STD) testing.  Diabetes screening. This is done by checking your blood sugar (glucose) after you have not eaten for a while (fasting). You may have this done every 1-3 years.  Bone density scan. This is done to screen for osteoporosis. You may have this done starting at age 50.  Mammogram. This may be done every 1-2 years. Talk to your health care provider about how often you should have regular mammograms.  Talk with your health care provider about your test results, treatment options, and if necessary, the need for more tests. Vaccines Your health care provider may recommend certain vaccines, such as:  Influenza vaccine. This is recommended every year.  Tetanus, diphtheria, and acellular pertussis (Tdap, Td) vaccine. You may need a Td booster every 10 years.  Varicella vaccine. You may need this if you have not been vaccinated.  Zoster vaccine. You may need this after age 20.  Measles, mumps, and rubella (MMR) vaccine. You may need at least one dose of MMR if you were born in 1957 or later. You may also need a second dose.  Pneumococcal 13-valent conjugate (PCV13) vaccine. One dose is recommended after age 52.  Pneumococcal polysaccharide (PPSV23) vaccine. One dose  is recommended after age 46.  Meningococcal vaccine. You may need this if you have certain conditions.  Hepatitis A vaccine. You may need this if you have certain conditions or if you travel or work in places where you may be exposed to hepatitis A.  Hepatitis B vaccine. You may need this if you have certain conditions or if you travel or work in  places where you may be exposed to hepatitis B.  Haemophilus influenzae type b (Hib) vaccine. You may need this if you have certain conditions.  Talk to your health care provider about which screenings and vaccines you need and how often you need them. This information is not intended to replace advice given to you by your health care provider. Make sure you discuss any questions you have with your health care provider. Document Released: 07/08/2015 Document Revised: 02/29/2016 Document Reviewed: 04/12/2015 Elsevier Interactive Patient Education  [employer].

## 2018-02-10 ENCOUNTER — Encounter: Payer: Self-pay | Admitting: Pediatrics

## 2018-02-10 ENCOUNTER — Ambulatory Visit (INDEPENDENT_AMBULATORY_CARE_PROVIDER_SITE_OTHER): Payer: Medicare Other | Admitting: Pediatrics

## 2018-02-10 VITALS — BP 130/68 | HR 76 | Temp 97.6°F | Ht 62.0 in | Wt 112.2 lb

## 2018-02-10 DIAGNOSIS — M810 Age-related osteoporosis without current pathological fracture: Secondary | ICD-10-CM

## 2018-02-10 DIAGNOSIS — J4520 Mild intermittent asthma, uncomplicated: Secondary | ICD-10-CM | POA: Diagnosis not present

## 2018-02-10 DIAGNOSIS — I10 Essential (primary) hypertension: Secondary | ICD-10-CM

## 2018-02-10 DIAGNOSIS — I481 Persistent atrial fibrillation: Secondary | ICD-10-CM | POA: Diagnosis not present

## 2018-02-10 DIAGNOSIS — I5032 Chronic diastolic (congestive) heart failure: Secondary | ICD-10-CM | POA: Diagnosis not present

## 2018-02-10 DIAGNOSIS — I4819 Other persistent atrial fibrillation: Secondary | ICD-10-CM

## 2018-02-10 MED ORDER — DENOSUMAB 60 MG/ML ~~LOC~~ SOSY
60.0000 mg | PREFILLED_SYRINGE | Freq: Once | SUBCUTANEOUS | Status: AC
  Administered 2018-02-10: 60 mg via SUBCUTANEOUS

## 2018-02-10 MED ORDER — ALBUTEROL SULFATE HFA 108 (90 BASE) MCG/ACT IN AERS: 2.0000 | INHALATION_SPRAY | RESPIRATORY_TRACT | 3 refills | Status: DC | PRN

## 2018-02-10 MED ORDER — FUROSEMIDE 40 MG PO TABS: ORAL_TABLET | ORAL | 3 refills | Status: DC

## 2018-02-10 MED ORDER — AMLODIPINE BESYLATE 5 MG PO TABS: 5.0000 mg | ORAL_TABLET | Freq: Every day | ORAL | 3 refills | Status: DC

## 2018-02-10 MED ORDER — APIXABAN 2.5 MG PO TABS: 2.5000 mg | ORAL_TABLET | Freq: Two times a day (BID) | ORAL | 3 refills | Status: DC

## 2018-02-10 NOTE — Progress Notes (Signed)
  Subjective:   Patient ID: Crystal Cooper, female    DOB: 1926/12/25, 83 y.o.   MRN: [id number] CC: Medical Management of Chronic Issues  HPI: Crystal Cooper is a 84 y.o. female   Osteoporosis: Open to starting Prolia  Hypertension: Taking medicines regularly.  No lightheadedness or dizziness.  No falls since last visit.  Atrial fibrillation: On Eliquis.  No heart palpitations  Reactive airway disease: Takes albuterol at times for shortness of breath.  She thinks it helps she gets that feeling.  Happening regularly.  Does tend to happen more in hot weather.  Relevant past medical, surgical, family and social history reviewed. Allergies and medications reviewed and updated. Social History   Tobacco Use  Smoking Status Never Smoker  Smokeless Tobacco Never Used   ROS: Per HPI   Objective:    BP 130/68   Pulse 76   Temp 97.6 F (36.4 C) (Oral)   Ht 5' 2" (1.575 m)   Wt 112 lb 3.2 oz (50.9 kg)   BMI 20.52 kg/m   Wt Readings from Last 3 Encounters:  02/10/18 112 lb 3.2 oz (50.9 kg)  02/05/18 111 lb (50.3 kg)  12/10/17 111 lb 12.8 oz (50.7 kg)    Gen: NAD, alert, cooperative with exam, NCAT EYES: EOMI, no conjunctival injection, or no icterus ENT: OP without erythema LYMPH: no cervical LAD CV: NRRR, normal S1/S2, no murmur, distal pulses 2+ b/l Resp: CTABL, no wheezes, normal WOB Abd: +BS, soft, NTND.  Ext: No edema, warm Neuro: Alert and oriented, strength equal b/l UE and LE, coordination grossly normal MSK: normal muscle bulk  Assessment & Plan:  Crystal Cooper was seen today for medical management of chronic issues.  Diagnoses and all orders for this visit:  Osteoporosis, unspecified osteoporosis type, unspecified pathological fracture presence Start Prolia, given injection today. -     VITAMIN D 25 Hydroxy (Vit-D Deficiency, Fractures) -     BMP8+EGFR -     denosumab (PROLIA) injection 60 mg  Persistent atrial fibrillation (HCC) Stable, continue below -      apixaban (ELIQUIS) 2.5 MG TABS tablet; Take 1 tablet (2.5 mg total) by mouth 2 (two) times daily.  Essential hypertension Stable, continue below -     amLODipine (NORVASC) 5 MG tablet; Take 1 tablet (5 mg total) by mouth daily.  Mild intermittent reactive airway disease without complication Stable, continue below -     albuterol (PROVENTIL HFA;VENTOLIN HFA) 108 (90 Base) MCG/ACT inhaler; Inhale 2 puffs into the lungs every 4 (four) hours as needed for wheezing or shortness of breath.  Chronic diastolic congestive heart failure (HCC) No swelling today, euvolemic.  We will get blood work. -     furosemide (LASIX) 40 MG tablet; TAKE 1 & 1/2 (ONE & ONE-HALF) TABLETS BY MOUTH ONCE DAILY   Follow up plan: Return in about 6 months (around 08/13/2018). Assunta Found, MD [place]

## 2018-02-11 LAB — BMP8+EGFR
BUN / CREAT RATIO: 17 (ref 12–28)
BUN: 20 mg/dL (ref 8–27)
CO2: 26 mmol/L (ref 20–29)
CREATININE: 1.16 mg/dL — AB (ref 0.57–1.00)
Calcium: 9 mg/dL (ref 8.7–10.3)
Chloride: 94 mmol/L — ABNORMAL LOW (ref 96–106)
GFR calc Af Amer: 48 mL/min/{1.73_m2} — ABNORMAL LOW (ref >59)
GFR, EST NON AFRICAN AMERICAN: 42 mL/min/{1.73_m2} — AB (ref >59)
GLUCOSE: 93 mg/dL (ref 65–99)
Potassium: 4.1 mmol/L (ref 3.5–5.2)
SODIUM: 134 mmol/L (ref 134–144)

## 2018-02-11 LAB — VITAMIN D 25 HYDROXY (VIT D DEFICIENCY, FRACTURES): Vit D, 25-Hydroxy: 57.2 ng/mL (ref 30.0–100.0)

## 2018-02-12 ENCOUNTER — Telehealth: Payer: Self-pay | Admitting: Cardiology

## 2018-02-12 ENCOUNTER — Ambulatory Visit (INDEPENDENT_AMBULATORY_CARE_PROVIDER_SITE_OTHER): Payer: Medicare Other | Admitting: *Deleted

## 2018-02-12 DIAGNOSIS — I480 Paroxysmal atrial fibrillation: Secondary | ICD-10-CM

## 2018-02-12 DIAGNOSIS — R001 Bradycardia, unspecified: Secondary | ICD-10-CM

## 2018-02-12 NOTE — Progress Notes (Signed)
Remote pacemaker transmission.   

## 2018-02-12 NOTE — Telephone Encounter (Signed)
Attempted to confirm remote transmission with pt. No answer and was unable to leave a message.   

## 2018-02-13 ENCOUNTER — Ambulatory Visit: Payer: Medicare Other | Admitting: *Deleted

## 2018-02-14 ENCOUNTER — Encounter: Payer: Self-pay | Admitting: Cardiology

## 2018-02-14 DIAGNOSIS — H40002 Preglaucoma, unspecified, left eye: Secondary | ICD-10-CM | POA: Diagnosis not present

## 2018-02-14 DIAGNOSIS — H353132 Nonexudative age-related macular degeneration, bilateral, intermediate dry stage: Secondary | ICD-10-CM | POA: Diagnosis not present

## 2018-02-14 DIAGNOSIS — H401111 Primary open-angle glaucoma, right eye, mild stage: Secondary | ICD-10-CM | POA: Diagnosis not present

## 2018-02-17 ENCOUNTER — Telehealth: Payer: Self-pay

## 2018-02-17 NOTE — Telephone Encounter (Signed)
**Note De-Identified  Obfuscation** I received a fax addressed to me from the pts daughter, Marin Roberts, concerning the pt applying for pt asst with [employer] for her Eliquis.  She is asking what the criteria is for the pt to be approved for pt assistance with [employer]. I faxed her back a [employer] pt asst application with a message written on the front page asking her to call [employer] (phone number is listed on the front page of the application) as I do not know all of the requirements.

## 2018-02-25 ENCOUNTER — Other Ambulatory Visit: Payer: Self-pay

## 2018-02-25 DIAGNOSIS — I4819 Other persistent atrial fibrillation: Secondary | ICD-10-CM

## 2018-02-25 MED ORDER — APIXABAN 2.5 MG PO TABS: 2.5000 mg | ORAL_TABLET | Freq: Two times a day (BID) | ORAL | 3 refills | Status: DC

## 2018-02-25 NOTE — Telephone Encounter (Signed)
[name] called me back and stated that she has not faxed the pts [employer]MS pt asst application in. I have advised her that I will fax it to [employer] along with the provider part.  Dr Curt Bears has signed the provider part of the application and I have faxed all to [employer].

## 2018-02-25 NOTE — Telephone Encounter (Signed)
Fax received (Copy of the pts BMS pt asst application) from the pts daughter Marin Roberts.  I have called and left a message on [place] [facility] asking her to call me back as it is unclear if she has already faxed this application to [employer] or if she wants me to.

## 2018-03-05 ENCOUNTER — Other Ambulatory Visit: Payer: Self-pay

## 2018-03-05 DIAGNOSIS — I4819 Other persistent atrial fibrillation: Secondary | ICD-10-CM

## 2018-03-05 MED ORDER — APIXABAN 2.5 MG PO TABS: 2.5000 mg | ORAL_TABLET | Freq: Two times a day (BID) | ORAL | 3 refills | Status: DC

## 2018-03-05 NOTE — Telephone Encounter (Signed)
BMS pt asst application faxed back to [place] with note stating that the application is out dated. They sent a new application.  I have completed the new BMS pt asst application and printed a new Eliquis RX. I have placed both in Dr [employer] mail bin awaiting his signature.

## 2018-03-10 NOTE — Telephone Encounter (Signed)
Dr Curt Bears has signed the pts application and Eliquis RX. I have faxed all back to [employer].

## 2018-03-17 ENCOUNTER — Encounter: Payer: Self-pay | Admitting: Family Medicine

## 2018-03-17 ENCOUNTER — Ambulatory Visit (INDEPENDENT_AMBULATORY_CARE_PROVIDER_SITE_OTHER): Payer: Medicare Other | Admitting: Family Medicine

## 2018-03-17 VITALS — BP 136/75 | HR 82 | Temp 98.2°F | Ht 62.0 in | Wt 112.0 lb

## 2018-03-17 DIAGNOSIS — J4520 Mild intermittent asthma, uncomplicated: Secondary | ICD-10-CM | POA: Diagnosis not present

## 2018-03-17 DIAGNOSIS — J45901 Unspecified asthma with (acute) exacerbation: Secondary | ICD-10-CM

## 2018-03-17 MED ORDER — AMOXICILLIN-POT CLAVULANATE 875-125 MG PO TABS: 1.0000 | ORAL_TABLET | Freq: Two times a day (BID) | ORAL | 0 refills | Status: AC

## 2018-03-17 MED ORDER — BETAMETHASONE SOD PHOS & ACET 6 (3-3) MG/ML IJ SUSP
6.0000 mg | Freq: Once | INTRAMUSCULAR | Status: AC
  Administered 2018-03-17: 6 mg via INTRAMUSCULAR

## 2018-03-17 NOTE — Patient Instructions (Signed)
Continue using inh.aler at least 4 times a day

## 2018-03-17 NOTE — Progress Notes (Signed)
Subjective:  Patient ID: Crystal Cooper, female    DOB: 08/27/1926  Age: 84 y.o. MRN: [id number]  CC: Asthma (cough, shortness of breath; symptoms began 2 days ago, seems to be worse at night, feels better today, using Albuterol inhaler)   HPI Crystal Cooper presents for exacerbation of her asthma.  For the last 2 days she has had increasing shortness of breath that has been partially relieved temporarily by the use of her albuterol inhaler.  Unfortunately the wheezing comes back along with the shortness of breath.  She has had no fever chills and sweats.  She does have a cough but it has been nonproductive.  She has a long history of asthma.  She does not use maintenance inhalers.  She thinks she is getting some better since starting regular use of the albuterol inhaler yesterday. Depression screen [facility] 2/9 03/17/2018 02/10/2018 02/10/2018  Decreased Interest 0 0 0  Down, Depressed, Hopeless 0 1 1  PHQ - 2 Score 0 1 1    History Bridey has a past medical history of Anemia, Aortic stenosis, Arthritis, Asthma, Atrial fibrillation ([place]), CHF (congestive heart failure) ([place]) (11/2014), Family history of adverse reaction to anesthesia, Glaucoma, Hearing loss, Heart murmur, HTN (hypertension), Pneumonia, Prolapsing mitral leaflet syndrome, Shortness of breath dyspnea, and SVT (supraventricular tachycardia) ([place]).   She has a past surgical history that includes Appendectomy; Bladder surgery; Cardiac surgery; Nasal sinus surgery; Cardiac catheterization (N/A, 09/26/2015); Eye surgery (Bilateral); Cardiac catheterization (N/A, 10/26/2015); Transcatheter aortic valve replacement, transfemoral (Right, 10/25/2015); TEE without cardioversion (N/A, 10/25/2015); and Cardioversion (N/A, 03/02/2016).   Her family history includes Hypertension in her mother; Pneumonia in her father.She reports that she has never smoked. She has never used smokeless tobacco. She reports that she does not drink alcohol or use  drugs.    ROS Review of Systems  Constitutional: Negative.  Negative for fever.  HENT: Negative.   Eyes: Negative for visual disturbance.  Respiratory: Positive for cough and shortness of breath.   Cardiovascular: Negative for chest pain.  Gastrointestinal: Negative for abdominal pain.  Musculoskeletal: Negative for arthralgias.    Objective:  BP 136/75   Pulse 82   Temp 98.2 F (36.8 C) (Oral)   Ht 5\' 2"  (1.575 m)   Wt 112 lb (50.8 kg)   SpO2 97%   BMI 20.49 kg/m   BP Readings from Last 3 Encounters:  03/17/18 136/75  02/10/18 130/68  02/05/18 139/75    Wt Readings from Last 3 Encounters:  03/17/18 112 lb (50.8 kg)  02/10/18 112 lb 3.2 oz (50.9 kg)  02/05/18 111 lb (50.3 kg)     Physical Exam  Constitutional: She is oriented to person, place, and time. She appears well-developed and well-nourished. No distress.  HENT:  Head: Normocephalic and atraumatic.  Eyes: Pupils are equal, round, and reactive to light. Conjunctivae are normal.  Neck: Normal range of motion. Neck supple. No thyromegaly present.  Cardiovascular: Normal rate, regular rhythm and normal heart sounds.  No murmur heard. Pulmonary/Chest: Effort normal. No respiratory distress. She has wheezes (Noted at the left base with associated rhonchi). She has no rales.  Abdominal: Soft.  Musculoskeletal: Normal range of motion.  Lymphadenopathy:    She has no cervical adenopathy.  Neurological: She is alert and oriented to person, place, and time.  Skin: Skin is warm and dry.  Psychiatric: She has a normal mood and affect. Her behavior is normal. Thought content normal.      Assessment & Plan:  Crystal Cooper was seen today for asthma.  Diagnoses and all orders for this visit:  Mild intermittent reactive airway disease without complication -     betamethasone acetate-betamethasone sodium phosphate (CELESTONE) injection 6 mg  Moderate asthma with exacerbation, unspecified whether persistent  Other  orders -     amoxicillin-clavulanate (AUGMENTIN) 875-125 MG tablet; Take 1 tablet by mouth 2 (two) times daily for 10 days.       I am having Crystal Cooper start on amoxicillin-clavulanate. I am also having her maintain her multivitamin-iron-minerals-folic acid, calcium-vitamin D, acetaminophen, Biotin, furosemide, amLODipine, albuterol, and apixaban. We administered betamethasone acetate-betamethasone sodium phosphate.  Allergies as of 03/17/2018      Reactions   Hctz [hydrochlorothiazide] Other (See Comments)   Pt was ill and affected kidneys   Codeine Other (See Comments)   Unknown  Made pt feel ill, has not had any problems since 1977      Medication List        Accurate as of 03/17/18  7:19 PM. Always use your most recent med list.          acetaminophen 500 MG tablet Commonly known as:  TYLENOL Take 500 mg by mouth every 6 (six) hours as needed for mild pain.   albuterol 108 (90 Base) MCG/ACT inhaler Commonly known as:  PROVENTIL HFA;VENTOLIN HFA Inhale 2 puffs into the lungs every 4 (four) hours as needed for wheezing or shortness of breath.   amLODipine 5 MG tablet Commonly known as:  NORVASC Take 1 tablet (5 mg total) by mouth daily.   amoxicillin-clavulanate 875-125 MG tablet Commonly known as:  AUGMENTIN Take 1 tablet by mouth 2 (two) times daily for 10 days.   apixaban 2.5 MG Tabs tablet Commonly known as:  ELIQUIS Take 1 tablet (2.5 mg total) by mouth 2 (two) times daily.   Biotin 10 MG Caps Take 1 capsule by mouth daily.   calcium-vitamin D 500-200 MG-UNIT tablet Commonly known as:  OSCAL WITH D Take 1 tablet by mouth daily.   furosemide 40 MG tablet Commonly known as:  LASIX TAKE 1 & 1/2 (ONE & ONE-HALF) TABLETS BY MOUTH ONCE DAILY   multivitamin-iron-minerals-folic acid chewable tablet Chew 1 tablet by mouth daily.        Follow-up: No follow-ups on file.  [name], M.D.

## 2018-03-18 LAB — CUP PACEART REMOTE DEVICE CHECK
Battery Remaining Longevity: 89 mo
Battery Voltage: 3.01 V
Brady Statistic AP VP Percent: 4.04 %
Brady Statistic AP VS Percent: 3.63 %
Brady Statistic AS VP Percent: 18.73 %
Brady Statistic AS VS Percent: 73.6 %
Brady Statistic RA Percent Paced: 7.55 %
Brady Statistic RV Percent Paced: 22.92 %
Date Time Interrogation Session: 20190821153806
Implantable Lead Implant Date: 20170503
Implantable Lead Implant Date: 20170503
Implantable Lead Location: 753859
Implantable Lead Location: 753860
Implantable Lead Model: 5076
Implantable Lead Model: 5076
Implantable Pulse Generator Implant Date: 20170503
Lead Channel Impedance Value: 285 Ohm
Lead Channel Impedance Value: 342 Ohm
Lead Channel Impedance Value: 399 Ohm
Lead Channel Impedance Value: 399 Ohm
Lead Channel Pacing Threshold Amplitude: 1 V
Lead Channel Pacing Threshold Amplitude: 1 V
Lead Channel Pacing Threshold Pulse Width: 0.4 ms
Lead Channel Pacing Threshold Pulse Width: 0.4 ms
Lead Channel Sensing Intrinsic Amplitude: 2.5 mV
Lead Channel Sensing Intrinsic Amplitude: 2.5 mV
Lead Channel Sensing Intrinsic Amplitude: 9.75 mV
Lead Channel Sensing Intrinsic Amplitude: 9.75 mV
Lead Channel Setting Pacing Amplitude: 2 V
Lead Channel Setting Pacing Amplitude: 2.5 V
Lead Channel Setting Pacing Pulse Width: 0.4 ms
Lead Channel Setting Sensing Sensitivity: 2.8 mV

## 2018-03-24 ENCOUNTER — Telehealth: Payer: Self-pay | Admitting: Cardiology

## 2018-03-24 NOTE — Telephone Encounter (Signed)
Letter received via fax from [employer] stating that they have approved the pts Eliquis for pt asst. Approval good from 03/21/2018 until 06/24/2018.

## 2018-03-24 NOTE — Telephone Encounter (Signed)
Error no note needed °

## 2018-04-06 ENCOUNTER — Other Ambulatory Visit: Payer: Self-pay

## 2018-04-06 ENCOUNTER — Encounter (HOSPITAL_COMMUNITY): Payer: Self-pay | Admitting: Emergency Medicine

## 2018-04-06 ENCOUNTER — Other Ambulatory Visit (HOSPITAL_COMMUNITY): Payer: Medicare Other

## 2018-04-06 ENCOUNTER — Observation Stay (HOSPITAL_COMMUNITY)
Admission: EM | Admit: 2018-04-06 | Discharge: 2018-04-08 | Disposition: A | Discharge status: Home / Self Care | Payer: Medicare Other | Attending: Internal Medicine | Admitting: Internal Medicine

## 2018-04-06 ENCOUNTER — Emergency Department (HOSPITAL_COMMUNITY): Payer: Medicare Other

## 2018-04-06 DIAGNOSIS — R0602 Shortness of breath: Secondary | ICD-10-CM | POA: Diagnosis not present

## 2018-04-06 DIAGNOSIS — M1990 Unspecified osteoarthritis, unspecified site: Secondary | ICD-10-CM | POA: Insufficient documentation

## 2018-04-06 DIAGNOSIS — D62 Acute posthemorrhagic anemia: Secondary | ICD-10-CM | POA: Insufficient documentation

## 2018-04-06 DIAGNOSIS — I5032 Chronic diastolic (congestive) heart failure: Secondary | ICD-10-CM | POA: Diagnosis not present

## 2018-04-06 DIAGNOSIS — Z950 Presence of cardiac pacemaker: Secondary | ICD-10-CM | POA: Insufficient documentation

## 2018-04-06 DIAGNOSIS — K31819 Angiodysplasia of stomach and duodenum without bleeding: Secondary | ICD-10-CM | POA: Diagnosis not present

## 2018-04-06 DIAGNOSIS — Z23 Encounter for immunization: Secondary | ICD-10-CM | POA: Diagnosis not present

## 2018-04-06 DIAGNOSIS — I480 Paroxysmal atrial fibrillation: Secondary | ICD-10-CM | POA: Insufficient documentation

## 2018-04-06 DIAGNOSIS — K2970 Gastritis, unspecified, without bleeding: Secondary | ICD-10-CM

## 2018-04-06 DIAGNOSIS — D500 Iron deficiency anemia secondary to blood loss (chronic): Secondary | ICD-10-CM

## 2018-04-06 DIAGNOSIS — Z952 Presence of prosthetic heart valve: Secondary | ICD-10-CM | POA: Insufficient documentation

## 2018-04-06 DIAGNOSIS — K222 Esophageal obstruction: Secondary | ICD-10-CM | POA: Insufficient documentation

## 2018-04-06 DIAGNOSIS — I4892 Unspecified atrial flutter: Secondary | ICD-10-CM | POA: Insufficient documentation

## 2018-04-06 DIAGNOSIS — K449 Diaphragmatic hernia without obstruction or gangrene: Secondary | ICD-10-CM | POA: Insufficient documentation

## 2018-04-06 DIAGNOSIS — I5189 Other ill-defined heart diseases: Secondary | ICD-10-CM

## 2018-04-06 DIAGNOSIS — H409 Unspecified glaucoma: Secondary | ICD-10-CM | POA: Insufficient documentation

## 2018-04-06 DIAGNOSIS — K2950 Unspecified chronic gastritis without bleeding: Secondary | ICD-10-CM | POA: Insufficient documentation

## 2018-04-06 DIAGNOSIS — D509 Iron deficiency anemia, unspecified: Secondary | ICD-10-CM | POA: Diagnosis not present

## 2018-04-06 DIAGNOSIS — D649 Anemia, unspecified: Secondary | ICD-10-CM | POA: Diagnosis not present

## 2018-04-06 DIAGNOSIS — I110 Hypertensive heart disease with heart failure: Secondary | ICD-10-CM | POA: Diagnosis not present

## 2018-04-06 DIAGNOSIS — K921 Melena: Secondary | ICD-10-CM

## 2018-04-06 DIAGNOSIS — E871 Hypo-osmolality and hyponatremia: Secondary | ICD-10-CM | POA: Insufficient documentation

## 2018-04-06 DIAGNOSIS — K625 Hemorrhage of anus and rectum: Secondary | ICD-10-CM | POA: Diagnosis not present

## 2018-04-06 DIAGNOSIS — I471 Supraventricular tachycardia: Secondary | ICD-10-CM | POA: Diagnosis not present

## 2018-04-06 DIAGNOSIS — I700 Atherosclerosis of aorta: Secondary | ICD-10-CM | POA: Diagnosis not present

## 2018-04-06 DIAGNOSIS — K2990 Gastroduodenitis, unspecified, without bleeding: Secondary | ICD-10-CM

## 2018-04-06 DIAGNOSIS — K2980 Duodenitis without bleeding: Secondary | ICD-10-CM | POA: Insufficient documentation

## 2018-04-06 DIAGNOSIS — J45909 Unspecified asthma, uncomplicated: Secondary | ICD-10-CM | POA: Diagnosis not present

## 2018-04-06 DIAGNOSIS — Z7901 Long term (current) use of anticoagulants: Secondary | ICD-10-CM | POA: Diagnosis not present

## 2018-04-06 DIAGNOSIS — I350 Nonrheumatic aortic (valve) stenosis: Secondary | ICD-10-CM | POA: Insufficient documentation

## 2018-04-06 DIAGNOSIS — R0609 Other forms of dyspnea: Secondary | ICD-10-CM | POA: Diagnosis not present

## 2018-04-06 LAB — VITAMIN B12: Vitamin B-12: 759 pg/mL (ref 180–914)

## 2018-04-06 LAB — CBC
HCT: 18.1 % — ABNORMAL LOW (ref 36.0–46.0)
HEMOGLOBIN: 5.4 g/dL — AB (ref 12.0–15.0)
MCH: 23.1 pg — AB (ref 26.0–34.0)
MCHC: 29.8 g/dL — AB (ref 30.0–36.0)
MCV: 77.4 fL — ABNORMAL LOW (ref 80.0–100.0)
Platelets: 215 10*3/uL (ref 150–400)
RBC: 2.34 MIL/uL — ABNORMAL LOW (ref 3.87–5.11)
RDW: 14.7 % (ref 11.5–15.5)
WBC: 4.6 10*3/uL (ref 4.0–10.5)
nRBC: 0 % (ref 0.0–0.2)

## 2018-04-06 LAB — I-STAT TROPONIN, ED: TROPONIN I, POC: 0.03 ng/mL (ref 0.00–0.08)

## 2018-04-06 LAB — URINALYSIS, ROUTINE W REFLEX MICROSCOPIC
Bilirubin Urine: NEGATIVE
Glucose, UA: NEGATIVE mg/dL
Ketones, ur: NEGATIVE mg/dL
Nitrite: NEGATIVE
Protein, ur: NEGATIVE mg/dL
Specific Gravity, Urine: 1.008 (ref 1.005–1.030)
WBC, UA: >50 WBC/hpf — ABNORMAL HIGH (ref 0–5)
pH: 8 (ref 5.0–8.0)

## 2018-04-06 LAB — HEMOGLOBIN AND HEMATOCRIT, BLOOD
HCT: 28.4 % — ABNORMAL LOW (ref 36.0–46.0)
Hemoglobin: 9.1 g/dL — ABNORMAL LOW (ref 12.0–15.0)

## 2018-04-06 LAB — BASIC METABOLIC PANEL
Anion gap: 10 (ref 5–15)
BUN: 11 mg/dL (ref 8–23)
CALCIUM: 8.2 mg/dL — AB (ref 8.9–10.3)
CO2: 25 mmol/L (ref 22–32)
Chloride: 98 mmol/L (ref 98–111)
Creatinine, Ser: 1.01 mg/dL — ABNORMAL HIGH (ref 0.44–1.00)
GFR, EST AFRICAN AMERICAN: 55 mL/min — AB (ref >60)
GFR, EST NON AFRICAN AMERICAN: 48 mL/min — AB (ref >60)
GLUCOSE: 110 mg/dL — AB (ref 70–99)
POTASSIUM: 3.2 mmol/L — AB (ref 3.5–5.1)
SODIUM: 133 mmol/L — AB (ref 135–145)

## 2018-04-06 LAB — BRAIN NATRIURETIC PEPTIDE
B Natriuretic Peptide: 521.2 pg/mL — ABNORMAL HIGH (ref 0.0–100.0)
B Natriuretic Peptide: 790.9 pg/mL — ABNORMAL HIGH (ref 0.0–100.0)

## 2018-04-06 LAB — IRON AND TIBC
Iron: 408 ug/dL — ABNORMAL HIGH (ref 28–170)
Saturation Ratios: 85 % — ABNORMAL HIGH (ref 10.4–31.8)
TIBC: 482 ug/dL — ABNORMAL HIGH (ref 250–450)
UIBC: 74 ug/dL

## 2018-04-06 LAB — FERRITIN: Ferritin: 6 ng/mL — ABNORMAL LOW (ref 11–307)

## 2018-04-06 LAB — TROPONIN I: Troponin I: 0.06 ng/mL (ref <0.03)

## 2018-04-06 LAB — POC OCCULT BLOOD, ED: Fecal Occult Bld: POSITIVE — AB

## 2018-04-06 LAB — PREPARE RBC (CROSSMATCH)

## 2018-04-06 MED ORDER — CENTRUM PO CHEW
1.0000 | CHEWABLE_TABLET | Freq: Every day | ORAL | Status: DC
  Filled 2018-04-06: qty 1

## 2018-04-06 MED ORDER — CALCIUM CARBONATE-VITAMIN D 500-200 MG-UNIT PO TABS
1.0000 | ORAL_TABLET | Freq: Every day | ORAL | Status: DC
  Administered 2018-04-06 – 2018-04-08 (×3): 1 via ORAL
  Filled 2018-04-06 (×4): qty 1

## 2018-04-06 MED ORDER — FUROSEMIDE 40 MG PO TABS
60.0000 mg | ORAL_TABLET | Freq: Every day | ORAL | Status: DC
  Administered 2018-04-06 – 2018-04-08 (×3): 60 mg via ORAL
  Filled 2018-04-06 (×4): qty 1

## 2018-04-06 MED ORDER — DOCUSATE SODIUM 100 MG PO CAPS: 100.0000 mg | ORAL_CAPSULE | Freq: Every day | ORAL | Status: DC | PRN

## 2018-04-06 MED ORDER — AMLODIPINE BESYLATE 5 MG PO TABS
5.0000 mg | ORAL_TABLET | Freq: Every day | ORAL | Status: DC
  Administered 2018-04-06 – 2018-04-08 (×3): 5 mg via ORAL
  Filled 2018-04-06 (×4): qty 1

## 2018-04-06 MED ORDER — ALBUTEROL SULFATE (2.5 MG/3ML) 0.083% IN NEBU
3.0000 mL | INHALATION_SOLUTION | RESPIRATORY_TRACT | Status: DC | PRN
  Administered 2018-04-07 (×2): 3 mL via RESPIRATORY_TRACT
  Filled 2018-04-06 (×2): qty 3

## 2018-04-06 MED ORDER — FUROSEMIDE 10 MG/ML IJ SOLN
20.0000 mg | Freq: Once | INTRAMUSCULAR | Status: AC
  Administered 2018-04-06: 20 mg via INTRAVENOUS
  Filled 2018-04-06: qty 2

## 2018-04-06 MED ORDER — SODIUM CHLORIDE 0.9% IV SOLUTION: Freq: Once | INTRAVENOUS | Status: DC

## 2018-04-06 MED ORDER — INFLUENZA VAC SPLIT HIGH-DOSE 0.5 ML IM SUSY: 0.5000 mL | PREFILLED_SYRINGE | INTRAMUSCULAR | Status: DC | PRN

## 2018-04-06 MED ORDER — POTASSIUM CHLORIDE CRYS ER 20 MEQ PO TBCR
40.0000 meq | EXTENDED_RELEASE_TABLET | ORAL | Status: AC
  Administered 2018-04-06 (×2): 40 meq via ORAL
  Filled 2018-04-06 (×2): qty 2

## 2018-04-06 MED ORDER — ADULT MULTIVITAMIN W/MINERALS CH
1.0000 | ORAL_TABLET | Freq: Every day | ORAL | Status: DC
  Administered 2018-04-06 – 2018-04-08 (×3): 1 via ORAL
  Filled 2018-04-06 (×4): qty 1

## 2018-04-06 MED ORDER — SODIUM CHLORIDE 0.9% IV SOLUTION
Freq: Once | INTRAVENOUS | Status: AC
  Administered 2018-04-06: 10:00:00 via INTRAVENOUS

## 2018-04-06 MED ORDER — FERROUS SULFATE 325 (65 FE) MG PO TABS
325.0000 mg | ORAL_TABLET | Freq: Two times a day (BID) | ORAL | Status: DC
  Administered 2018-04-06 – 2018-04-08 (×3): 325 mg via ORAL
  Filled 2018-04-06 (×3): qty 1

## 2018-04-06 MED ORDER — ALBUTEROL SULFATE (2.5 MG/3ML) 0.083% IN NEBU
5.0000 mg | INHALATION_SOLUTION | Freq: Once | RESPIRATORY_TRACT | Status: AC
  Administered 2018-04-06: 5 mg via RESPIRATORY_TRACT
  Filled 2018-04-06: qty 6

## 2018-04-06 NOTE — ED Notes (Signed)
CRITICAL VALUE ALERT  Critical Value:  Hemoglobin 5.4 Date & Time Notied:  04/06/2018 Provider Notified: Eulis Foster

## 2018-04-06 NOTE — ED Triage Notes (Signed)
Pt reports she has been SOB for about two weeks.  Pt does have a cardiac hx, however no chest pain.  When asked if she had pain she denied however she later stated she could "feel it in my back."

## 2018-04-06 NOTE — Consult Note (Addendum)
Cardiology Consult    Patient ID: MAKAIYA GEERDES MRN: [id number], DOB/AGE: 1926/09/08   Admit date: 04/06/2018 Date of Consult: 04/06/2018  Primary Physician: Eustaquio Maize, MD Primary Cardiologist: Dr. Stanford Breed  Requesting Provider: Dr. Marthenia Rolling Reason for Consultation: dyspnea  Crystal Cooper is a 84 y.o. female who is being seen today for the evaluation of dyspnea at the request of Dr. Marthenia Rolling.   Patient Profile    84 year old female with past medical history of AVNRT with ablation in 2003, severe aortic stenosis with TAVR 5/17, and pacemaker placed at that time as well, A. fib/flutter who presented with dyspnea and found to have acute GI bleed.  Past Medical History   Past Medical History:  Diagnosis Date  . Anemia    years ago  . Aortic stenosis   . Arthritis   . Asthma   . Atrial fibrillation ([place])   . CHF (congestive heart failure) ([place]) 11/2014  . Family history of adverse reaction to anesthesia    2 daughters would have N/V  . Glaucoma   . Hearing loss   . Heart murmur   . HTN (hypertension)   . Pneumonia   . Prolapsing mitral leaflet syndrome   . Shortness of breath dyspnea    with exertion  . SVT (supraventricular tachycardia) ([facility])    S/P ablation of AVNRT in 2003    Past Surgical History:  Procedure Laterality Date  . APPENDECTOMY    . BLADDER SURGERY    . CARDIAC CATHETERIZATION N/A 09/26/2015   Procedure: Right/Left Heart Cath and Coronary Angiography;  Surgeon: Burnell Blanks, MD;  Location: [facility];  Service: Cardiovascular;  Laterality: N/A;  . CARDIAC SURGERY    . CARDIOVERSION N/A 03/02/2016   Procedure: CARDIOVERSION;  Surgeon: Thayer Headings, MD;  Location: [facility];  Service: Cardiovascular;  Laterality: N/A;  . EP IMPLANTABLE DEVICE N/A 10/26/2015   Procedure: Pacemaker Implant;  Surgeon: Will Meredith Leeds, MD;  Location: [place] [facility];  Service: Cardiovascular;  Laterality: N/A;  . EYE SURGERY Bilateral    cataract surgery  . NASAL SINUS SURGERY    . TEE WITHOUT CARDIOVERSION N/A 10/25/2015   Procedure: TRANSESOPHAGEAL ECHOCARDIOGRAM (TEE);  Surgeon: Burnell Blanks, MD;  Location: [facility];  Service: Open Heart Surgery;  Laterality: N/A;  . TRANSCATHETER AORTIC VALVE REPLACEMENT, TRANSFEMORAL Right 10/25/2015   Procedure: TRANSCATHETER AORTIC VALVE REPLACEMENT, TRANSFEMORAL;  Surgeon: Burnell Blanks, MD;  Location: [place];  Service: Open Heart Surgery;  Laterality: Right;     Allergies  Allergies  Allergen Reactions  . Hctz [Hydrochlorothiazide] Other (See Comments)    Pt was ill and affected kidneys   . Codeine Other (See Comments)    Unknown  Made pt feel ill, has not had any problems since 1977    History of Present Illness    Crystal Cooper is a 84 year old female with past medical history noted above.  She is followed by Dr. Stanford Breed as an outpatient.  She had an echocardiogram in February 2017 that showed normal LV function, severe AS with a mean gradient of 43 mmHg.  Cath at that time showed no significant CAD and ultimately underwent TAVR on 5/17 with subsequent pacemaker placement.  She had a cardioversion on 03/02/2016.  Had an echocardiogram on May 2018 which showed normal systolic dysfunction bioprosthetic valve with normal function, biatrial enlargement and mild right ventricular enlargement.  She was last seen in the office by Dr. Stanford Breed on 07/29/2017 and  reported doing well at that time.  She is followed by Dr. Curt Bears as an outpatient for her pacemaker.   She presented to the ED on 10/13 with reports of significant dyspnea, fatigue and orthopnea over the past several weeks.  No chest pain or dizziness.  In the ED her labs showed sodium of 133, potassium 3.2, BNP 521, hemoglobin 5.4, and her stool was heme positive.  Chest x-ray was negative for acute edema.  She was admitted to internal medicine and given 2 units of PRBCs and GI consulted.  Inpatient Medications    .  sodium chloride   Intravenous Once  . amLODipine  5 mg Oral Daily  . calcium-vitamin D  1 tablet Oral Daily  . ferrous sulfate  325 mg Oral BID WC  . furosemide  60 mg Oral Daily  . multivitamin with minerals  1 tablet Oral Daily    Family History    Family History  Problem Relation Age of Onset  . Hypertension Mother   . Pneumonia Father   . Heart attack Neg Hx     Social History    Social History   Socioeconomic History  . Marital status: Widowed    Spouse name: Not on file  . Number of children: 3  . Years of education: Not on file  . Highest education level: Not on file  Occupational History  . Not on file  Social Needs  . Financial resource strain: Not on file  . Food insecurity:    Worry: Not on file    Inability: Not on file  . Transportation needs:    Medical: Not on file    Non-medical: Not on file  Tobacco Use  . Smoking status: Never Smoker  . Smokeless tobacco: Never Used  Substance and Sexual Activity  . Alcohol use: No    Alcohol/week: 0.0 standard drinks  . Drug use: No  . Sexual activity: Never  Lifestyle  . Physical activity:    Days per week: Not on file    Minutes per session: Not on file  . Stress: Not on file  Relationships  . Social connections:    Talks on phone: Not on file    Gets together: Not on file    Attends religious service: Not on file    Active member of club or organization: Not on file    Attends meetings of clubs or organizations: Not on file    Relationship status: Not on file  . Intimate partner violence:    Fear of current or ex partner: Not on file    Emotionally abused: Not on file    Physically abused: Not on file    Forced sexual activity: Not on file  Other Topics Concern  . Not on file  Social History Narrative  . Not on file     Review of Systems    See HPI All other systems reviewed and are otherwise negative except as noted above.  Physical Exam    Blood pressure (!) 144/57, pulse 79,  temperature 97.8 F (36.6 C), temperature source Oral, resp. rate 18, height 5\' 2"  (1.575 m), weight 50.8 kg, SpO2 100 %.  General: Pleasant, older white female NAD Psych: Normal affect. Neuro: Alert and oriented X 3. Moves all extremities spontaneously. HEENT: Normal  Neck: Supple, no JVD. Lungs:  Resp regular and unlabored, CTA. Heart: RRR no murmurs. Abdomen: Soft, non-tender, non-distended, BS + x 4.  Extremities: No clubbing, cyanosis or edema. DP/PT/Radials 2+ and  equal bilaterally.  Labs    Troponin [facility] of [facility] Test) Recent Labs    04/06/18 0637  TROPIPOC 0.03   No results for input(s): CKTOTAL, CKMB, TROPONINI in the last 72 hours. Lab Results  Component Value Date   WBC 4.6 04/06/2018   HGB 5.4 (LL) 04/06/2018   HCT 18.1 (L) 04/06/2018   MCV 77.4 (L) 04/06/2018   PLT 215 04/06/2018    Recent Labs  Lab 04/06/18 0633  NA 133*  K 3.2*  CL 98  CO2 25  BUN 11  CREATININE 1.01*  CALCIUM 8.2*  GLUCOSE 110*   No results found for: CHOL, HDL, LDLCALC, TRIG No results found for: [facility]   Radiology Studies    Dg Chest 2 View  Result Date: 04/06/2018 CLINICAL DATA:  Shortness of breath for 2 weeks.  Cardiac history. EXAM: CHEST - 2 VIEW COMPARISON:  10/27/2015.  CT 07/06/2016. FINDINGS: Hyperinflation. Osteopenia. Dual lead pacer. Midline trachea. Moderate cardiomegaly. Atherosclerosis in the transverse aorta. Aortic valve repair. No pleural effusion or pneumothorax. No lobar consolidation. Moderate lower and left-sided predominant interstitial thickening is felt to be similar, given differences in technique. IMPRESSION: Hyperinflation and chronic interstitial thickening, most consistent with COPD/chronic bronchitis. No convincing evidence of acute superimposed process. Aortic Atherosclerosis (ICD10-I70.0). Electronically Signed   By: Abigail Miyamoto M.D.   On: 04/06/2018 07:23    ECG & Cardiac Imaging    EKG:  The EKG was personally reviewed and demonstrates sinus  rhythm with first-degree AV block  Echo: 11/02/2016  Study Conclusions  - Left ventricle: The cavity size was normal. Systolic function was   normal. The estimated ejection fraction was in the range of 60%   to 65%. Wall motion was normal; there were no regional wall   motion abnormalities. - Aortic valve: A TAVR bioprosthesis was present and functioning   normally. Peak velocity (S): 161 cm/s. - Left atrium: The atrium was mildly dilated. - Right ventricle: The cavity size was mildly dilated. Wall   thickness was normal. - Right atrium: The atrium was moderately dilated.  Assessment & Plan    84 year old female with past medical history of AVNRT with ablation in 2003, severe aortic stenosis with TAVR 5/17, and pacemaker placed at that time as well, A. fib/flutter who presented with dyspnea and found to have acute GI bleed.  1.  Dyspnea: This is in the setting of acute GI bleed with a hemoglobin of 5.  She does not appear volume overloaded on exam, and chest x-ray without acute edema.  BNP is elevated, but no JVD noted.  Suspect this is related to her anemia, but repeat echo pending to further evaluate aortic valve that was done back in 2017.  2.  Aortic stenosis status post TAVR: Reports she has been doing well since having this done.  Denies any significant weight gain or lower extremity edema over the past several weeks.  Does not appear volume overloa  ded on exam. --Echo pending to further evaluate aortic valve.  3.  Acute GI bleed: Hemoglobin noted at 5 on admission.  Also noted with positive heme stool.  GI has been consulted with plans to do an EGD in the a.m.  She was transfused 2 units while in the ED.  Further management per primary.  4.  A. Fib: EKG notes sinus rhythm on admission.  Her Eliquis has been held in the setting of acute GI bleed. CHA2DS2-VASc Score of 4. Further decision on [place] pending GI work up.  Barnet Pall, NP-C Pager [phone] 04/06/2018,  3:29 PM   Patient seen and examined. Agree with assessment and plan.  Crystal Cooper is a very pleasant 84 year old female who has a history of AV nodal reentrant tachycardia and is status post ablation in 2003.  She had developed progressive aortic valve stenosis leading to ultimately TAVR surgery in May 2017 and required a permanent pacemaker.  She is followed by Dr. Stanford Breed.  She has been on chronic Eliquis for atrial fibrillation/flutter.  She denies any significant change in the color of her stools.  She denies bright red blood per rectum.  Recently she has started to notice more shortness of breath.  She presented to the hospital today with increasing shortness of breath and was found to have severe anemia with a hemoglobin of 5.4 and positive stools for blood.  Remotely she had undergone normal screening colonoscopy in 2007.  She denies any abdominal discomfort.  She denies any chest pain.  She denies any presyncope or syncope.  Her last echo Doppler study was in May 2018 which showed an EF of 60 to 65%.  Her TAVR bioprosthesis was present and was functioning normally.  She had mild dilation of her atrium and right ventricle.  On exam her blood pressure is stable but mildly increased at 144/57.  Telemetry reveals predominant ventricular pacing with intermittent atrial sensing and atrial past pacing.  There is no JVD.  She has bilateral transmitted murmurs to her carotids.  Her lungs are relatively clear.  Rhythm was regular with a 1/6 systolic murmur.  I did not appreciate aortic insufficiency.  Abdomen was soft and nontender.  She did not have clubbing cyanosis or edema.  Her ECG shows ventricular pacing at 76 bpm with occasional atrial sensing and occasional atrial pacing.  Laboratory is notable for normal renal function.  Hemoglobin 5.4, hematocrit 18.1, MCV 77.4.  She received 2 units of blood cell transfusion.  Fecal stool is positive for blood.  A 2D echo Doppler study has been ordered for  reassessment.  I suspect her decompensation with increasing dyspnea is due to her profound anemia resulting in decreased oxygen carrying capacity.  I will schedule her for follow-up echo Doppler study to reassess her systolic and diastolic function as well as competence of her TAVR bioprosthetic valve.  Follow-up CBC posttransfusion.  Okay to proceed with endoscopy with SBE prophylaxis in light of aortic valve replacement.  Eliquis is held in light of her GI bleed and profound anemia.  With age and GI blood loss, alternative to full dose anticoagulation may be necessary in the future.   Troy Sine, MD, [facility] 04/06/2018 4:03 PM

## 2018-04-06 NOTE — ED Provider Notes (Signed)
[facility] EMERGENCY DEPARTMENT Provider Note   CSN: [id number] Arrival date & time: 04/06/18  3536     History   Chief Complaint Chief Complaint  Patient presents with  . Shortness of Breath    HPI Crystal Cooper is a 84 y.o. female.  HPI   She is here for shortness of breath, occurring intermittently both day and night, worsened last night at 4 AM.  She has a mild cough which is nonproductive.  She was recently treated for bronchitis with Augmentin.  No history of congestive heart failure.  She is not complaining of chest pain.  There is been no fever, chills, nausea or vomiting.  Most of the history is from her daughter who helps because the patient is very hard of hearing.  Patient complains of urinary incontinence but has not had any fecal incontinence.  No long-term similar problems.  There are no other known modifying factors.  Past Medical History:  Diagnosis Date  . Anemia    years ago  . Aortic stenosis   . Arthritis   . Asthma   . Atrial fibrillation ([place])   . CHF (congestive heart failure) ([place]) 11/2014  . Family history of adverse reaction to anesthesia    2 daughters would have N/V  . Glaucoma   . Hearing loss   . Heart murmur   . HTN (hypertension)   . Pneumonia   . Prolapsing mitral leaflet syndrome   . Shortness of breath dyspnea    with exertion  . SVT (supraventricular tachycardia) ([facility])    S/P ablation of AVNRT in 2003    Patient Active Problem List   Diagnosis Date Noted  . Symptomatic anemia 04/06/2018  . Osteoporosis 08/27/2016  . ASCVD (arteriosclerotic cardiovascular disease) 01/11/2016  . Asthma 01/11/2016  . Glaucoma 01/11/2016  . Hearing loss 01/11/2016  . Other nonrheumatic mitral valve disorders 01/11/2016  . Junctional bradycardia   . Aortic valve stenosis 10/25/2015  . Atrial fibrillation ([place]) 09/06/2015  . Hyponatremia 12/17/2014  . Atrial flutter ([place]) 12/14/2014  . Congestive heart failure ([place])  12/12/2014  . Aortic stenosis 11/13/2013  . Murmur 10/19/2011  . Chest pain 10/19/2011  . Hypertension 10/19/2011  . SVT (supraventricular tachycardia) ([place]) 10/19/2011    Past Surgical History:  Procedure Laterality Date  . APPENDECTOMY    . BLADDER SURGERY    . CARDIAC CATHETERIZATION N/A 09/26/2015   Procedure: Right/Left Heart Cath and Coronary Angiography;  Surgeon: Burnell Blanks, MD;  Location: [facility];  Service: Cardiovascular;  Laterality: N/A;  . CARDIAC SURGERY    . CARDIOVERSION N/A 03/02/2016   Procedure: CARDIOVERSION;  Surgeon: Thayer Headings, MD;  Location: [facility];  Service: Cardiovascular;  Laterality: N/A;  . EP IMPLANTABLE DEVICE N/A 10/26/2015   Procedure: Pacemaker Implant;  Surgeon: Will Meredith Leeds, MD;  Location: [facility];  Service: Cardiovascular;  Laterality: N/A;  . EYE SURGERY Bilateral    cataract surgery  . NASAL SINUS SURGERY    . TEE WITHOUT CARDIOVERSION N/A 10/25/2015   Procedure: TRANSESOPHAGEAL ECHOCARDIOGRAM (TEE);  Surgeon: Burnell Blanks, MD;  Location: [facility];  Service: Open Heart Surgery;  Laterality: N/A;  . TRANSCATHETER AORTIC VALVE REPLACEMENT, TRANSFEMORAL Right 10/25/2015   Procedure: TRANSCATHETER AORTIC VALVE REPLACEMENT, TRANSFEMORAL;  Surgeon: Burnell Blanks, MD;  Location: [facility];  Service: Open Heart Surgery;  Laterality: Right;     OB History   None      Home Medications  Prior to Admission medications   Medication Sig Start Date End Date Taking? Authorizing Provider  acetaminophen (TYLENOL) 500 MG tablet Take 500 mg by mouth every 6 (six) hours as needed for mild pain.   Yes [provider]  albuterol (PROVENTIL HFA;VENTOLIN HFA) 108 (90 Base) MCG/ACT inhaler Inhale 2 puffs into the lungs every 4 (four) hours as needed for wheezing or shortness of breath. 02/10/18  Yes Eustaquio Maize, MD  amLODipine (NORVASC) 5 MG tablet Take 1 tablet (5 mg total) by mouth daily.  02/10/18  Yes Eustaquio Maize, MD  apixaban (ELIQUIS) 2.5 MG TABS tablet Take 1 tablet (2.5 mg total) by mouth 2 (two) times daily. 03/05/18  Yes Camnitz, Ocie Doyne, MD  Biotin 10 MG CAPS Take 1 capsule by mouth daily.   Yes [provider]  calcium-vitamin D (OSCAL WITH D) 500-200 MG-UNIT tablet Take 1 tablet by mouth daily. 08/27/16  Yes Cherre Robins, PharmD  docusate sodium (COLACE) 100 MG capsule Take 100 mg by mouth daily as needed for mild constipation.   Yes [provider]  furosemide (LASIX) 40 MG tablet TAKE 1 & 1/2 (ONE & ONE-HALF) TABLETS BY MOUTH ONCE DAILY 02/10/18  Yes Eustaquio Maize, MD  multivitamin-iron-minerals-folic acid (CENTRUM) chewable tablet Chew 1 tablet by mouth daily.   Yes [provider]    Family History Family History  Problem Relation Age of Onset  . Hypertension Mother   . Pneumonia Father   . Heart attack Neg Hx     Social History Social History   Tobacco Use  . Smoking status: Never Smoker  . Smokeless tobacco: Never Used  Substance Use Topics  . Alcohol use: No    Alcohol/week: 0.0 standard drinks  . Drug use: No     Allergies   Hctz [hydrochlorothiazide] and Codeine   Review of Systems Review of Systems  All other systems reviewed and are negative.    Physical Exam Updated Vital Signs BP (!) 128/45   Pulse 94   Temp 97.6 F (36.4 C) (Oral)   Resp (!) 25   Ht 5\' 2"  (1.575 m)   Wt 50.8 kg   SpO2 98%   BMI 20.49 kg/m   Physical Exam  Constitutional: She is oriented to person, place, and time. She appears well-developed. She does not appear ill.  Elderly, frail  HENT:  Head: Normocephalic and atraumatic.  Eyes: Pupils are equal, round, and reactive to light. EOM are normal.  Pale conjunctiva  Neck: Normal range of motion and phonation normal. Neck supple.  Cardiovascular: Normal rate and regular rhythm.  Pulmonary/Chest: Effort normal and breath sounds normal. No stridor. No respiratory  distress. She has no wheezes. She exhibits no tenderness.  Good air movement bilaterally  Abdominal: Soft. She exhibits no distension. There is no tenderness. There is no guarding.  Genitourinary:  Genitourinary Comments: Anus with normal sphincter tone.  Small amount of brown stool in rectal vault.  Stool sent for Hemoccult testing.  Musculoskeletal: Normal range of motion. She exhibits no edema or deformity.  Neurological: She is alert and oriented to person, place, and time. She exhibits normal muscle tone.  Skin: Skin is warm and dry. There is pallor.  Psychiatric: She has a normal mood and affect. Her behavior is normal. Judgment and thought content normal.  Nursing note and vitals reviewed.    ED Treatments / Results  Labs (all labs ordered are listed, but only abnormal results are displayed) Labs Reviewed  BASIC  METABOLIC PANEL - Abnormal; Notable for the following components:      Result Value   Sodium 133 (*)    Potassium 3.2 (*)    Glucose, Bld 110 (*)    Creatinine, Ser 1.01 (*)    Calcium 8.2 (*)    GFR calc non Af Amer 48 (*)    GFR calc Af Amer 55 (*)    All other components within normal limits  CBC - Abnormal; Notable for the following components:   RBC 2.34 (*)    Hemoglobin 5.4 (*)    HCT 18.1 (*)    MCV 77.4 (*)    MCH 23.1 (*)    MCHC 29.8 (*)    All other components within normal limits  POC OCCULT BLOOD, ED - Abnormal; Notable for the following components:   Fecal Occult Bld POSITIVE (*)    All other components within normal limits  BRAIN NATRIURETIC PEPTIDE  URINALYSIS, ROUTINE W REFLEX MICROSCOPIC  I-STAT TROPONIN, ED  TYPE AND SCREEN  PREPARE RBC (CROSSMATCH)  PREPARE RBC (CROSSMATCH)    EKG EKG Interpretation  Date/Time:  Sunday April 06 2018 06:20:56 EDT Ventricular Rate:  76 PR Interval:    QRS Duration: 162 QT Interval:  420 QTC Calculation: 472 R Axis:   21 Text Interpretation:  Ventricular-paced rhythm with occasional AV  dual-paced complexes Abnormal ECG Interpretation limited secondary to artifact Confirmed by Thayer Jew [phone]) on 04/06/2018 6:34:52 AM   Radiology Dg Chest 2 View  Result Date: 04/06/2018 CLINICAL DATA:  Shortness of breath for 2 weeks.  Cardiac history. EXAM: CHEST - 2 VIEW COMPARISON:  10/27/2015.  CT 07/06/2016. FINDINGS: Hyperinflation. Osteopenia. Dual lead pacer. Midline trachea. Moderate cardiomegaly. Atherosclerosis in the transverse aorta. Aortic valve repair. No pleural effusion or pneumothorax. No lobar consolidation. Moderate lower and left-sided predominant interstitial thickening is felt to be similar, given differences in technique. IMPRESSION: Hyperinflation and chronic interstitial thickening, most consistent with COPD/chronic bronchitis. No convincing evidence of acute superimposed process. Aortic Atherosclerosis (ICD10-I70.0). Electronically Signed   By: Abigail Miyamoto M.D.   On: 04/06/2018 07:23    Procedures .Critical Care Performed by: Daleen Bo, MD Authorized by: Daleen Bo, MD   Critical care provider statement:    Critical care time (minutes):  35   Critical care start time:  04/06/2018 7:02 AM   Critical care end time:  04/06/2018 8:05 AM   Critical care time was exclusive of:  Separately billable procedures and treating other patients   Critical care was necessary to treat or prevent imminent or life-threatening deterioration of the following conditions:  Circulatory failure   Critical care was time spent personally by me on the following activities:  Blood draw for specimens, development of treatment plan with patient or surrogate, discussions with consultants, evaluation of patient's response to treatment, examination of patient, obtaining history from patient or surrogate, ordering and performing treatments and interventions, ordering and review of laboratory studies, pulse oximetry, re-evaluation of patient's condition, review of old charts and  ordering and review of radiographic studies   (including critical care time)  Medications Ordered in ED Medications  0.9 %  sodium chloride infusion (Manually program via Guardrails IV Fluids) (has no administration in time range)  0.9 %  sodium chloride infusion (Manually program via Guardrails IV Fluids) (has no administration in time range)  furosemide (LASIX) injection 20 mg (has no administration in time range)  potassium chloride SA (K-DUR,KLOR-CON) CR tablet 40 mEq (has no administration in time range)  albuterol (PROVENTIL) (2.5 MG/3ML) 0.083% nebulizer solution 5 mg (5 mg Nebulization Given 04/06/18 0654)     Initial Impression / Assessment and Plan / ED Course  I have reviewed the triage vital signs and the nursing notes.  Pertinent labs & imaging results that were available during my care of the patient were reviewed by me and considered in my medical decision making (see chart for details).  Clinical Course as of Apr 06 841  Sun Apr 06, 2018  0745 Abnormal, blood present  POC occult blood, ED Provider will collect(!) [EW]  0745 Abnormal, sodium low, potassium low, glucose low, creatinine, calcium, GFR low  Basic metabolic panel(!) [EW]  1696 Normal  I-stat troponin, ED [EW]  0745 Normal except hemoglobin low, MCV low  CBC(!!) [EW]  85 Sister with bronchitis, no infiltrate or CHF, images reviewed by me  DG Chest 2 View [EW]    Clinical Course User Index [EW] Daleen Bo, MD     Patient Vitals for the past 24 hrs:  BP Temp Temp src Pulse Resp SpO2 Height Weight  04/06/18 0800 (!) 128/45 - - 94 (!) 25 98 % - -  04/06/18 0730 (!) 146/59 - - 90 (!) 23 100 % - -  04/06/18 0702 - - - - - - 5\' 2"  (1.575 m) 50.8 kg  04/06/18 0629 140/85 97.6 F (36.4 C) Oral 90 20 98 % - -    7:46 AM Reevaluation with update and discussion. After initial assessment and treatment, an updated evaluation reveals no change in clinical status.  Findings discussed with patient and  family members, they agreed to proceed with blood transfusion.  All questions answered. Daleen Bo   Medical Decision Making: Symptomatic anemia, with blood in stool, and decreased MCV consistent with iron deficiency.  She does not have evidence for acute pulmonary process.  Shortness of breath likely due to anemia.  Patient requires blood transfusion for nebulization and will need to be hospitalized.  Trace positive brown stool in rectal vault.  Suspect chronic GI bleed, leading to iron deficiency anemia.  CRITICAL CARE-yes Performed by: Daleen Bo   Nursing Notes Reviewed/ Care Coordinated Applicable Imaging Reviewed Interpretation of Laboratory Data incorporated into ED treatment   7:47 AM-Consult complete with hospitalist. Patient case explained and discussed.  He agrees to admit patient for further evaluation and treatment. Call ended at 7:53 AM  Plan: Admit   Final Clinical Impressions(s) / ED Diagnoses   Final diagnoses:  Iron deficiency anemia, unspecified iron deficiency anemia type  Rectal bleeding    ED Discharge Orders    None       Daleen Bo, MD 04/06/18 [phone]

## 2018-04-06 NOTE — Consult Note (Addendum)
Referring Provider: No ref. provider found Primary Care Physician:  Eustaquio Maize, MD Primary Gastroenterologist:  Dr. Manus Rudd  Reason for Consultation:  Hemoccult + stools, symptomatic anemia   IMPRESSION:  Suspected GI blood loss anemia presenting with a hemoglobin of 5.4, hemoccult + stools Ongoing shortness of breath despite 2 units of PRBCs. Normal BUN Mild hyponatremia Normal screening colonoscopy with Dr. Gala Romney 2007  Etiology of GI blood loss anemia is unclear. Both upper and lower sources must be considered as the patient has no localizing symptoms. However, given her advanced age and comorbidities, will request cardiology clearance for endoscopy.  I am recommending an EGD first. Could consider a colonoscopy later if the EGD is nondiagnostic.  PLAN: EGD tomorrow if cleared by Cardiology  Serial hgb/hct with additional transfusion as indicated Obtain PT/INR Regular diet today. NPO at midnight Consider colonoscopy after full Eliquis washout if EGD is nondiagnostic  I consented the patient at the bedside today discussing the risks, benefits, and alternatives to endoscopic evaluation. In particular, we discussed the risks that include, but are not limited to, reaction to medication, cardiopulmonary compromise, bleeding requiring blood transfusion, aspiration resulting in pneumonia, perforation requiring surgery, and even death. We reviewed the risk of missed lesion including polyps or even cancer. The patient acknowledges these risks and asks that we proceed.      HPI: Crystal Cooper is a 84 y.o. female seen in consultation requested Dr. Marthenia Rolling for further evaluation of symptomatic anemia and Hemoccult-positive stools.  History is obtained through the patient, her daughter who is present at the bedside, and review of her electronic health record.  Patient is followed by Dr. Stanford Breed for atrial fibrillation requiring Eliquis, supraventricular tachycardia, hypertension,  moderate to severe aortic stenosis, and diastolic congestive heart failure per an echocardiogram in 2015. 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  She was admitted earlier today with progressive shortness of breath and fatigue.  She was found to have a hemoglobin of 5.4.  Her hemoglobin was 11.7 September. She remains somewhat short of breath despite 2 units of PRBCs earlier today.   No dysphagia, odynophagia, heartburn, nausea, vomiting, abdominal pain, change in bowel habits, melena, hematochezia, or bright red blood per rectum. She has chronic constipation that responds to stool softeners PRN (not more than weekly). There is no overt GI blood loss. No epistaxis, vaginal bleeding, hemoptysis, or hematuria.   She uses tylenol for arthralgias. Denies the use of any NSAIDs. No prior GI bleeding.  She reports a normal screening colonoscopy with Dr. Gala Romney in 2007. He told her that she did not need to have another colonoscopy as she was 79 at the time.    Past Medical History:  Diagnosis Date  . Anemia    years ago  . Aortic stenosis   . Arthritis   . Asthma   . Atrial fibrillation ([place])   . CHF (congestive heart failure) ([place]) 11/2014  . Family history of adverse reaction to anesthesia    2 daughters would have N/V  . Glaucoma   . Hearing loss   . Heart murmur   . HTN (hypertension)   . Pneumonia   . Prolapsing mitral leaflet syndrome   . Shortness of breath dyspnea    with exertion  . SVT (supraventricular tachycardia) ([facility])    S/P ablation of AVNRT in 2003    Past Surgical History:  Procedure Laterality Date  . APPENDECTOMY    .  BLADDER SURGERY    . CARDIAC CATHETERIZATION N/A 09/26/2015   Procedure: Right/Left Heart Cath and Coronary Angiography;  Surgeon: Burnell Blanks, MD;  Location: [place];  Service: Cardiovascular;  Laterality: N/A;  . CARDIAC SURGERY    . CARDIOVERSION N/A 03/02/2016    Procedure: CARDIOVERSION;  Surgeon: Thayer Headings, MD;  Location: [facility];  Service: Cardiovascular;  Laterality: N/A;  . EP IMPLANTABLE DEVICE N/A 10/26/2015   Procedure: Pacemaker Implant;  Surgeon: Will Meredith Leeds, MD;  Location: [place];  Service: Cardiovascular;  Laterality: N/A;  . EYE SURGERY Bilateral    cataract surgery  . NASAL SINUS SURGERY    . TEE WITHOUT CARDIOVERSION N/A 10/25/2015   Procedure: TRANSESOPHAGEAL ECHOCARDIOGRAM (TEE);  Surgeon: Burnell Blanks, MD;  Location: [facility];  Service: Open Heart Surgery;  Laterality: N/A;  . TRANSCATHETER AORTIC VALVE REPLACEMENT, TRANSFEMORAL Right 10/25/2015   Procedure: TRANSCATHETER AORTIC VALVE REPLACEMENT, TRANSFEMORAL;  Surgeon: Burnell Blanks, MD;  Location: [place];  Service: Open Heart Surgery;  Laterality: Right;    Prior to Admission medications   Medication Sig Start Date End Date Taking? Authorizing Provider  acetaminophen (TYLENOL) 500 MG tablet Take 500 mg by mouth every 6 (six) hours as needed for mild pain.   Yes [provider]  albuterol (PROVENTIL HFA;VENTOLIN HFA) 108 (90 Base) MCG/ACT inhaler Inhale 2 puffs into the lungs every 4 (four) hours as needed for wheezing or shortness of breath. 02/10/18  Yes Eustaquio Maize, MD  amLODipine (NORVASC) 5 MG tablet Take 1 tablet (5 mg total) by mouth daily. 02/10/18  Yes Eustaquio Maize, MD  apixaban (ELIQUIS) 2.5 MG TABS tablet Take 1 tablet (2.5 mg total) by mouth 2 (two) times daily. 03/05/18  Yes Camnitz, Ocie Doyne, MD  Biotin 10 MG CAPS Take 1 capsule by mouth daily.   Yes [provider]  calcium-vitamin D (OSCAL WITH D) 500-200 MG-UNIT tablet Take 1 tablet by mouth daily. 08/27/16  Yes Cherre Robins, PharmD  docusate sodium (COLACE) 100 MG capsule Take 100 mg by mouth daily as needed for mild constipation.   Yes [provider]  furosemide (LASIX) 40 MG tablet TAKE 1 & 1/2 (ONE & ONE-HALF) TABLETS BY MOUTH ONCE DAILY  02/10/18  Yes Eustaquio Maize, MD  multivitamin-iron-minerals-folic acid (CENTRUM) chewable tablet Chew 1 tablet by mouth daily.   Yes [provider]    Current Facility-Administered Medications  Medication Dose Route Frequency Provider Last Rate Last Dose  . 0.9 %  sodium chloride infusion (Manually program via Guardrails IV Fluids)   Intravenous Once Dana Allan I, MD      . albuterol (PROVENTIL) (2.5 MG/3ML) 0.083% nebulizer solution 3 mL  3 mL Inhalation Q4H PRN Dana Allan I, MD      . amLODipine (NORVASC) tablet 5 mg  5 mg Oral Daily Dana Allan I, MD   5 mg at 04/06/18 1238  . calcium-vitamin D (OSCAL WITH D) 500-200 MG-UNIT per tablet 1 tablet  1 tablet Oral Daily Dana Allan I, MD   1 tablet at 04/06/18 1238  . docusate sodium (COLACE) capsule 100 mg  100 mg Oral Daily PRN Dana Allan I, MD      . ferrous sulfate tablet 325 mg  325 mg Oral BID WC Dana Allan I, MD      . furosemide (LASIX) tablet 60 mg  60 mg Oral Daily Dana Allan I, MD   60 mg at 04/06/18 1238  .  Influenza vac split quadrivalent PF (FLUZONE HIGH-DOSE) injection 0.5 mL  0.5 mL Intramuscular Prior to discharge Dana Allan I, MD      . multivitamin with minerals tablet 1 tablet  1 tablet Oral Daily Skeet Simmer, [employer]   1 tablet at 04/06/18 1238    Allergies as of 04/06/2018 - Review Complete 04/06/2018  Allergen Reaction Noted  . Hctz [hydrochlorothiazide] Other (See Comments)   . Codeine Other (See Comments) 01/11/2016    Family History  Problem Relation Age of Onset  . Hypertension Mother   . Pneumonia Father   . Heart attack Neg Hx     Social History   Socioeconomic History  . Marital status: Widowed    Spouse name: Not on file  . Number of children: 3  . Years of education: Not on file  . Highest education level: Not on file  Occupational History  . Not on file  Social Needs  . Financial resource strain: Not on file  . Food insecurity:      Worry: Not on file    Inability: Not on file  . Transportation needs:    Medical: Not on file    Non-medical: Not on file  Tobacco Use  . Smoking status: Never Smoker  . Smokeless tobacco: Never Used  Substance and Sexual Activity  . Alcohol use: No    Alcohol/week: 0.0 standard drinks  . Drug use: No  . Sexual activity: Never  Lifestyle  . Physical activity:    Days per week: Not on file    Minutes per session: Not on file  . Stress: Not on file  Relationships  . Social connections:    Talks on phone: Not on file    Gets together: Not on file    Attends religious service: Not on file    Active member of club or organization: Not on file    Attends meetings of clubs or organizations: Not on file    Relationship status: Not on file  . Intimate partner violence:    Fear of current or ex partner: Not on file    Emotionally abused: Not on file    Physically abused: Not on file    Forced sexual activity: Not on file  Other Topics Concern  . Not on file  Social History Narrative  . Not on file    Review of Systems: 12 system ROS is negative except as noted above.   Physical Exam: Vital signs in last 24 hours: Temp:  [97.6 F (36.4 C)-97.8 F (36.6 C)] 97.8 F (36.6 C) (10/13 1258) Pulse Rate:  [63-94] 79 (10/13 1258) Resp:  [18-25] 18 (10/13 1258) BP: (106-146)/(45-85) 144/57 (10/13 1258) SpO2:  [98 %-100 %] 100 % (10/13 1258) Weight:  [50.8 kg] 50.8 kg (10/13 0702) Last BM Date: 04/05/18 General:   Alert,  Elderly woman. She appears slightly short of breath and speaks in short phrases between breaths. She appears her stated age.  Head:  Normocephalic and atraumatic. Eyes:  Sclera clear, no icterus.   Conjunctiva pink. Ears:  Normal auditory acuity. Nose:  No deformity, discharge,  or lesions. Mouth:  No deformity or lesions.  No blood in the oropharynx. Neck:  Supple; no masses or thyromegaly. Lungs:  Clear throughout to auscultation.   No wheezes, crackles,  or rhonchi.  Heart:  Regular rate and rhythm; no murmurs, clicks, rubs,  or gallops. Abdomen:  Soft,slight protuberent, nontender, normal bowel sounds, no mass or hsm.   Rectal:  Deferred. Hemoccult positive per the ED staff.   Msk:  Symmetrical without gross deformities. . Pulses:  Normal pulses noted. Extremities:  Without clubbing or edema. Neurologic:  Alert and  oriented x4;  grossly normal neurologically. Skin:  Intact without significant lesions or rashes.. Psych:  Alert and cooperative. Normal mood and affect.  Lab Results: Recent Labs    04/06/18 0633  WBC 4.6  HGB 5.4*  HCT 18.1*  PLT 215   BMET Recent Labs    04/06/18 0633  NA 133*  K 3.2*  CL 98  CO2 25  GLUCOSE 110*  BUN 11  CREATININE 1.01*  CALCIUM 8.2*     Studies/Results: Dg Chest 2 View  Result Date: 04/06/2018 CLINICAL DATA:  Shortness of breath for 2 weeks.  Cardiac history. EXAM: CHEST - 2 VIEW COMPARISON:  10/27/2015.  CT 07/06/2016. FINDINGS: Hyperinflation. Osteopenia. Dual lead pacer. Midline trachea. Moderate cardiomegaly. Atherosclerosis in the transverse aorta. Aortic valve repair. No pleural effusion or pneumothorax. No lobar consolidation. Moderate lower and left-sided predominant interstitial thickening is felt to be similar, given differences in technique. IMPRESSION: Hyperinflation and chronic interstitial thickening, most consistent with COPD/chronic bronchitis. No convincing evidence of acute superimposed process. Aortic Atherosclerosis (ICD10-I70.0). Electronically Signed   By: Abigail Miyamoto M.D.   On: 04/06/2018 07:23    I consented the patient at the bedside today discussing the risks, benefits, and alternatives to endoscopic evaluation. In particular, we discussed the risks that include, but are not limited to, reaction to medication, cardiopulmonary compromise, bleeding requiring blood transfusion, aspiration resulting in pneumonia, perforation requiring surgery, and even death. We  reviewed the risk of missed lesion including polyps or even cancer. The patient acknowledges these risks and asks that we proceed.

## 2018-04-06 NOTE — H&P (Signed)
History and Physical  KENTRELL GUETTLER NLG:[id number] DOB: 1926-09-30 DOA: 04/06/2018  Referring physician: ER physician, Dr. Eulis Foster. PCP: Eustaquio Maize, MD  Outpatient Specialists: Cardiology Patient coming from: Home  Chief Complaint: Shortness of breath and fatigue.  HPI: Patient is an 84 year old Caucasian female, with past medical history significant for atrial fibrillation for which patient takes Eliquis 2.5 mg p.o. twice daily, supraventricular tachycardias, hypertension, hearing loss, diastolic congestive heart failure as per echocardiogram done in 2015, moderate to severe aortic and anemia.  Patient is known to the cardiologist, Dr. Stanford Breed.  Patient was seen alongside patient's daughter.  Patient is hard of hearing.  Patient presents with fatigue, shortness of breath, dyspnea on exertion and orthopnea.  Apparently, this has been going on for a long time.  On presentation to the hospital, lab work done revealed hemoglobin of 5.4 g/dL, hemoglobin was 11.7 g/dL 7 months ago.  Fecal occult blood he said to be positive.  Potassium is 3.2 mmol/L, but patient is on Lasix 60 mg p.o. once daily.  Chest x-ray revealed "Hyperinflation and chronic interstitial thickening, most consistent with COPD/chronic bronchitis. No convincing evidence of acute superimposed process".  No headache, and no neck pain, and no fever chills, no URI symptoms, no chest pain, no GI symptoms and no urinary symptoms.  Hospitalist service has been called to admit patient for further assessment and management.  ED Course: Work-up for shortness of breath and fatigue. Pertinent labs: CBC reveals WBC of 4.6, hemoglobin of 5.4, hematocrit of 18.1, MCV of 77.4 with platelet count of 215.  Chemistry reveals sodium of 133, potassium of 3.2, chloride of 90, CO2 25, BUN of 11 and creatinine of 1.01 with blood sugar of 110.  Troponin is 0.03, cardiac BNP is 521.2. EKG: Independently reviewed.  Imaging: independently reviewed.    Review of Systems:  Negative for fever, visual changes, sore throat, rash, new muscle aches, chest pain, dysuria, bleeding, n/v/abdominal pain.  Past Medical History:  Diagnosis Date  . Anemia    years ago  . Aortic stenosis   . Arthritis   . Asthma   . Atrial fibrillation ([place])   . CHF (congestive heart failure) ([place]) 11/2014  . Family history of adverse reaction to anesthesia    2 daughters would have N/V  . Glaucoma   . Hearing loss   . Heart murmur   . HTN (hypertension)   . Pneumonia   . Prolapsing mitral leaflet syndrome   . Shortness of breath dyspnea    with exertion  . SVT (supraventricular tachycardia) ([facility])    S/P ablation of AVNRT in 2003    Past Surgical History:  Procedure Laterality Date  . APPENDECTOMY    . BLADDER SURGERY    . CARDIAC CATHETERIZATION N/A 09/26/2015   Procedure: Right/Left Heart Cath and Coronary Angiography;  Surgeon: Burnell Blanks, MD;  Location: [facility];  Service: Cardiovascular;  Laterality: N/A;  . CARDIAC SURGERY    . CARDIOVERSION N/A 03/02/2016   Procedure: CARDIOVERSION;  Surgeon: Thayer Headings, MD;  Location: [place];  Service: Cardiovascular;  Laterality: N/A;  . EP IMPLANTABLE DEVICE N/A 10/26/2015   Procedure: Pacemaker Implant;  Surgeon: Will Meredith Leeds, MD;  Location: [facility];  Service: Cardiovascular;  Laterality: N/A;  . EYE SURGERY Bilateral    cataract surgery  . NASAL SINUS SURGERY    . TEE WITHOUT CARDIOVERSION N/A 10/25/2015   Procedure: TRANSESOPHAGEAL ECHOCARDIOGRAM (TEE);  Surgeon: Burnell Blanks, MD;  Location: [facility];  Service: Open Heart Surgery;  Laterality: N/A;  . TRANSCATHETER AORTIC VALVE REPLACEMENT, TRANSFEMORAL Right 10/25/2015   Procedure: TRANSCATHETER AORTIC VALVE REPLACEMENT, TRANSFEMORAL;  Surgeon: Burnell Blanks, MD;  Location: [facility];  Service: Open Heart Surgery;  Laterality: Right;     reports that she has never smoked. She has never used  smokeless tobacco. She reports that she does not drink alcohol or use drugs.  Allergies  Allergen Reactions  . Hctz [Hydrochlorothiazide] Other (See Comments)    Pt was ill and affected kidneys   . Codeine Other (See Comments)    Unknown  Made pt feel ill, has not had any problems since 1977    Family History  Problem Relation Age of Onset  . Hypertension Mother   . Pneumonia Father   . Heart attack Neg Hx      Prior to Admission medications   Medication Sig Start Date End Date Taking? Authorizing Provider  acetaminophen (TYLENOL) 500 MG tablet Take 500 mg by mouth every 6 (six) hours as needed for mild pain.    [provider]  albuterol (PROVENTIL HFA;VENTOLIN HFA) 108 (90 Base) MCG/ACT inhaler Inhale 2 puffs into the lungs every 4 (four) hours as needed for wheezing or shortness of breath. 02/10/18   Eustaquio Maize, MD  amLODipine (NORVASC) 5 MG tablet Take 1 tablet (5 mg total) by mouth daily. 02/10/18   Eustaquio Maize, MD  apixaban (ELIQUIS) 2.5 MG TABS tablet Take 1 tablet (2.5 mg total) by mouth 2 (two) times daily. 03/05/18   Camnitz, Ocie Doyne, MD  Biotin 10 MG CAPS Take 1 capsule by mouth daily.    [provider]  calcium-vitamin D (OSCAL WITH D) 500-200 MG-UNIT tablet Take 1 tablet by mouth daily. 08/27/16   Cherre Robins, PharmD  furosemide (LASIX) 40 MG tablet TAKE 1 & 1/2 (ONE & ONE-HALF) TABLETS BY MOUTH ONCE DAILY 02/10/18   Eustaquio Maize, MD  multivitamin-iron-minerals-folic acid (CENTRUM) chewable tablet Chew 1 tablet by mouth daily.    [provider]    Physical Exam: Vitals:   04/06/18 0629 04/06/18 0702  BP: 140/85   Pulse: 90   Resp: 20   Temp: 97.6 F (36.4 C)   TempSrc: Oral   SpO2: 98%   Weight:  50.8 kg  Height:  5\' 2"  (1.575 m)   Constitutional:  . Appears calm and comfortable.  Hard of hearing. Eyes:  . Pallor..  ENMT:  . external ears, nose appear normal Neck:  . Neck is supple. No JVD Respiratory:   . CTA bilaterally, no w/r/r.  . Respiratory effort normal. No retractions or accessory muscle use Cardiovascular:  . S1S2 . No LE extremity edema   Abdomen:  . Abdomen is soft and non tender. Organs are difficult to assess. Neurologic:  . Awake and alert. . Moves all limbs.  Wt Readings from Last 3 Encounters:  04/06/18 50.8 kg  03/17/18 50.8 kg  02/10/18 50.9 kg    I have personally reviewed following labs and imaging studies  Labs on Admission:  CBC: Recent Labs  Lab 04/06/18 0633  WBC 4.6  HGB 5.4*  HCT 18.1*  MCV 77.4*  PLT 440   Basic Metabolic Panel: Recent Labs  Lab 04/06/18 0633  NA 133*  K 3.2*  CL 98  CO2 25  GLUCOSE 110*  BUN 11  CREATININE 1.01*  CALCIUM 8.2*   Liver Function Tests: No results for input(s): AST, ALT, ALKPHOS, BILITOT,  PROT, ALBUMIN in the last 168 hours. No results for input(s): LIPASE, AMYLASE in the last 168 hours. No results for input(s): AMMONIA in the last 168 hours. Coagulation Profile: No results for input(s): INR, PROTIME in the last 168 hours. Cardiac Enzymes: No results for input(s): CKTOTAL, CKMB, CKMBINDEX, TROPONINI in the last 168 hours. BNP (last 3 results) No results for input(s): PROBNP in the last 8760 hours. HbA1C: No results for input(s): HGBA1C in the last 72 hours. CBG: No results for input(s): GLUCAP in the last 168 hours. Lipid Profile: No results for input(s): CHOL, HDL, LDLCALC, TRIG, CHOLHDL, LDLDIRECT in the last 72 hours. Thyroid Function Tests: No results for input(s): TSH, T4TOTAL, FREET4, T3FREE, THYROIDAB in the last 72 hours. Anemia Panel: No results for input(s): VITAMINB12, FOLATE, FERRITIN, TIBC, IRON, RETICCTPCT in the last 72 hours. Urine analysis:    Component Value Date/Time   COLORURINE YELLOW 10/21/2015 1532   APPEARANCEUR CLEAR 10/21/2015 1532   LABSPEC 1.008 10/21/2015 1532   PHURINE 6.0 10/21/2015 1532   GLUCOSEU NEGATIVE 10/21/2015 1532   HGBUR TRACE (A) 10/21/2015 1532    BILIRUBINUR NEGATIVE 10/21/2015 1532   KETONESUR NEGATIVE 10/21/2015 1532   PROTEINUR NEGATIVE 10/21/2015 1532   NITRITE NEGATIVE 10/21/2015 1532   LEUKOCYTESUR SMALL (A) 10/21/2015 1532   Sepsis Labs: @LABRCNTIP (procalcitonin:4,lacticidven:4) )No results found for this or any previous visit (from the past 240 hour(s)).    Radiological Exams on Admission: Dg Chest 2 View  Result Date: 04/06/2018 CLINICAL DATA:  Shortness of breath for 2 weeks.  Cardiac history. EXAM: CHEST - 2 VIEW COMPARISON:  10/27/2015.  CT 07/06/2016. FINDINGS: Hyperinflation. Osteopenia. Dual lead pacer. Midline trachea. Moderate cardiomegaly. Atherosclerosis in the transverse aorta. Aortic valve repair. No pleural effusion or pneumothorax. No lobar consolidation. Moderate lower and left-sided predominant interstitial thickening is felt to be similar, given differences in technique. IMPRESSION: Hyperinflation and chronic interstitial thickening, most consistent with COPD/chronic bronchitis. No convincing evidence of acute superimposed process. Aortic Atherosclerosis (ICD10-I70.0). Electronically Signed   By: Abigail Miyamoto M.D.   On: 04/06/2018 07:23    EKG: Independently reviewed.   Active Problems:   * No active hospital problems. *   Assessment/Plan Symptomatic anemia: Hemoglobin is 5.4 g/dL MCV 77 Fecal occult blood is positive Suspect upper GI bleed Check iron stores Transfuse 2 units of packed red blood cells Hold Eliquis Consult GI and cardiology Start patient on iron Further management will depend on hospital course  History of diastolic dysfunction/aortic stenosis: Last visualized echocardiogram was in 2015. Moderate to severe left aortic stenosis noted Will consult cardiology team Patient may benefit from repeat echocardiogram, if none has been done by the cardiology team. Continue diuretics Further management will depend on hospital course  Atrial fibrillation: Rate controlled Eliquis  to be held.  Hypertension: Continue to optimize.  Further management will depend on hospital course.  DVT prophylaxis: SCD Code Status: Full code Family Communication: Patient's daughter Disposition Plan: Home eventually Consults called: We will consult cardiology and GI Admission status: Observation  Time spent: 60 minutes.  Dana Allan, MD  [facility] Pager #: [phone] 7PM-7AM contact night coverage as above  04/06/2018, 8:21 AM

## 2018-04-07 ENCOUNTER — Encounter (HOSPITAL_COMMUNITY)
Admission: EM | Disposition: A | Discharge status: Home / Self Care | Payer: Self-pay | Source: Home / Self Care | Attending: Emergency Medicine

## 2018-04-07 ENCOUNTER — Observation Stay (HOSPITAL_BASED_OUTPATIENT_CLINIC_OR_DEPARTMENT_OTHER): Payer: Medicare Other

## 2018-04-07 ENCOUNTER — Observation Stay (HOSPITAL_COMMUNITY): Payer: Medicare Other | Admitting: Anesthesiology

## 2018-04-07 ENCOUNTER — Encounter (HOSPITAL_COMMUNITY): Payer: Self-pay | Admitting: Anesthesiology

## 2018-04-07 DIAGNOSIS — K222 Esophageal obstruction: Secondary | ICD-10-CM | POA: Diagnosis not present

## 2018-04-07 DIAGNOSIS — E871 Hypo-osmolality and hyponatremia: Secondary | ICD-10-CM | POA: Diagnosis not present

## 2018-04-07 DIAGNOSIS — K2970 Gastritis, unspecified, without bleeding: Secondary | ICD-10-CM

## 2018-04-07 DIAGNOSIS — I700 Atherosclerosis of aorta: Secondary | ICD-10-CM | POA: Diagnosis not present

## 2018-04-07 DIAGNOSIS — D509 Iron deficiency anemia, unspecified: Secondary | ICD-10-CM | POA: Diagnosis not present

## 2018-04-07 DIAGNOSIS — K921 Melena: Secondary | ICD-10-CM

## 2018-04-07 DIAGNOSIS — K2990 Gastroduodenitis, unspecified, without bleeding: Secondary | ICD-10-CM | POA: Diagnosis not present

## 2018-04-07 DIAGNOSIS — I5032 Chronic diastolic (congestive) heart failure: Secondary | ICD-10-CM | POA: Diagnosis not present

## 2018-04-07 DIAGNOSIS — D62 Acute posthemorrhagic anemia: Secondary | ICD-10-CM | POA: Diagnosis not present

## 2018-04-07 DIAGNOSIS — I480 Paroxysmal atrial fibrillation: Secondary | ICD-10-CM | POA: Diagnosis not present

## 2018-04-07 DIAGNOSIS — K2951 Unspecified chronic gastritis with bleeding: Secondary | ICD-10-CM | POA: Diagnosis not present

## 2018-04-07 DIAGNOSIS — Z7901 Long term (current) use of anticoagulants: Secondary | ICD-10-CM | POA: Diagnosis not present

## 2018-04-07 DIAGNOSIS — I110 Hypertensive heart disease with heart failure: Secondary | ICD-10-CM | POA: Diagnosis not present

## 2018-04-07 DIAGNOSIS — D649 Anemia, unspecified: Secondary | ICD-10-CM | POA: Diagnosis not present

## 2018-04-07 DIAGNOSIS — K31819 Angiodysplasia of stomach and duodenum without bleeding: Secondary | ICD-10-CM | POA: Diagnosis not present

## 2018-04-07 DIAGNOSIS — I340 Nonrheumatic mitral (valve) insufficiency: Secondary | ICD-10-CM | POA: Diagnosis not present

## 2018-04-07 DIAGNOSIS — K449 Diaphragmatic hernia without obstruction or gangrene: Secondary | ICD-10-CM | POA: Diagnosis not present

## 2018-04-07 DIAGNOSIS — J45909 Unspecified asthma, uncomplicated: Secondary | ICD-10-CM | POA: Diagnosis not present

## 2018-04-07 DIAGNOSIS — I4892 Unspecified atrial flutter: Secondary | ICD-10-CM | POA: Diagnosis not present

## 2018-04-07 DIAGNOSIS — Z23 Encounter for immunization: Secondary | ICD-10-CM | POA: Diagnosis not present

## 2018-04-07 DIAGNOSIS — I350 Nonrheumatic aortic (valve) stenosis: Secondary | ICD-10-CM | POA: Diagnosis not present

## 2018-04-07 DIAGNOSIS — K2980 Duodenitis without bleeding: Secondary | ICD-10-CM | POA: Diagnosis not present

## 2018-04-07 HISTORY — PX: BIOPSY: SHX5522

## 2018-04-07 HISTORY — PX: ESOPHAGOGASTRODUODENOSCOPY (EGD) WITH PROPOFOL: SHX5813

## 2018-04-07 HISTORY — PX: HOT HEMOSTASIS: SHX5433

## 2018-04-07 LAB — COMPREHENSIVE METABOLIC PANEL
ALT: 18 U/L (ref 0–44)
AST: 25 U/L (ref 15–41)
Albumin: 3.5 g/dL (ref 3.5–5.0)
Alkaline Phosphatase: 54 U/L (ref 38–126)
Anion gap: 8 (ref 5–15)
BUN: 10 mg/dL (ref 8–23)
CO2: 25 mmol/L (ref 22–32)
Calcium: 8.2 mg/dL — ABNORMAL LOW (ref 8.9–10.3)
Chloride: 104 mmol/L (ref 98–111)
Creatinine, Ser: 1.01 mg/dL — ABNORMAL HIGH (ref 0.44–1.00)
GFR calc Af Amer: 55 mL/min — ABNORMAL LOW (ref >60)
GFR calc non Af Amer: 48 mL/min — ABNORMAL LOW (ref >60)
Glucose, Bld: 110 mg/dL — ABNORMAL HIGH (ref 70–99)
Potassium: 4.1 mmol/L (ref 3.5–5.1)
Sodium: 137 mmol/L (ref 135–145)
Total Bilirubin: 1.7 mg/dL — ABNORMAL HIGH (ref 0.3–1.2)
Total Protein: 6.6 g/dL (ref 6.5–8.1)

## 2018-04-07 LAB — TYPE AND SCREEN
ABO/RH(D): A POS
ANTIBODY SCREEN: NEGATIVE
UNIT DIVISION: 0
UNIT DIVISION: 0

## 2018-04-07 LAB — BPAM RBC
BLOOD PRODUCT EXPIRATION DATE: 201910302359
Blood Product Expiration Date: 201910312359
ISSUE DATE / TIME: 201910131003
ISSUE DATE / TIME: 201910131238
Unit Type and Rh: 6200
Unit Type and Rh: 6200

## 2018-04-07 LAB — ECHOCARDIOGRAM COMPLETE
Height: 62 in
Weight: 1792 oz

## 2018-04-07 LAB — FOLATE RBC
Folate, Hemolysate: >620 ng/mL
Folate, RBC: >2214 ng/mL (ref >498)
Hematocrit: 28 % — ABNORMAL LOW (ref 34.0–46.6)

## 2018-04-07 LAB — PROTIME-INR
INR: 1.17
Prothrombin Time: 14.8 s (ref 11.4–15.2)

## 2018-04-07 SURGERY — ESOPHAGOGASTRODUODENOSCOPY (EGD) WITH PROPOFOL
Anesthesia: Monitor Anesthesia Care

## 2018-04-07 MED ORDER — BENZONATATE 100 MG PO CAPS
200.0000 mg | ORAL_CAPSULE | Freq: Once | ORAL | Status: AC
  Administered 2018-04-07: 200 mg via ORAL
  Filled 2018-04-07: qty 2

## 2018-04-07 MED ORDER — INFLUENZA VAC SPLIT HIGH-DOSE 0.5 ML IM SUSY
0.5000 mL | PREFILLED_SYRINGE | INTRAMUSCULAR | Status: AC
  Administered 2018-04-08: 0.5 mL via INTRAMUSCULAR
  Filled 2018-04-07: qty 0.5

## 2018-04-07 MED ORDER — SODIUM CHLORIDE 0.9 % IV SOLN
INTRAVENOUS | Status: DC
  Administered 2018-04-07: 12:00:00 via INTRAVENOUS

## 2018-04-07 MED ORDER — PANTOPRAZOLE SODIUM 40 MG PO TBEC
40.0000 mg | DELAYED_RELEASE_TABLET | Freq: Every day | ORAL | Status: DC
  Administered 2018-04-07 – 2018-04-08 (×2): 40 mg via ORAL
  Filled 2018-04-07 (×2): qty 1

## 2018-04-07 MED ORDER — SODIUM CHLORIDE 0.9 % IV SOLN: INTRAVENOUS | Status: DC

## 2018-04-07 MED ORDER — SODIUM CHLORIDE 0.9 % IV SOLN
INTRAVENOUS | Status: DC | PRN
  Administered 2018-04-07: 12:00:00 via INTRAVENOUS

## 2018-04-07 MED ORDER — METHOCARBAMOL 500 MG PO TABS
500.0000 mg | ORAL_TABLET | Freq: Once | ORAL | Status: AC
  Administered 2018-04-07: 500 mg via ORAL
  Filled 2018-04-07: qty 1

## 2018-04-07 MED ORDER — PROPOFOL 10 MG/ML IV BOLUS
INTRAVENOUS | Status: DC | PRN
  Administered 2018-04-07 (×2): 10 mg via INTRAVENOUS
  Administered 2018-04-07: 30 mg via INTRAVENOUS
  Administered 2018-04-07 (×7): 20 mg via INTRAVENOUS
  Administered 2018-04-07: 10 mg via INTRAVENOUS

## 2018-04-07 MED ORDER — LIDOCAINE 2% (20 MG/ML) 5 ML SYRINGE
INTRAMUSCULAR | Status: DC | PRN
  Administered 2018-04-07: 20 mg via INTRAVENOUS
  Administered 2018-04-07: 30 mg via INTRAVENOUS

## 2018-04-07 SURGICAL SUPPLY — 14 items

## 2018-04-07 NOTE — Op Note (Signed)
[facility] Patient Name: Crystal Cooper Procedure Date : 04/07/2018 MRN: [id number] Attending MD: Jerene Bears , MD Date of Birth: Mar 09, 1927 CSN: [id number] Age: 84 Admit Type: Inpatient Procedure:                Upper GI endoscopy Indications:              Acute post hemorrhagic anemia, Melena Providers:                Lajuan Lines. Hilarie Fredrickson, MD, Carlyn Reichert, RN, Elspeth Cho                            Tech., Rockney Ghee, CRNA Referring MD:             [facility] Medicines:                Monitored Anesthesia Care Complications:            No immediate complications. Estimated Blood Loss:     Estimated blood loss was minimal. Procedure:                Pre-Anesthesia Assessment:                           - Prior to the procedure, a History and Physical                            was performed, and patient medications and                            allergies were reviewed. The patient's tolerance of                            previous anesthesia was also reviewed. The risks                            and benefits of the procedure and the sedation                            options and risks were discussed with the patient.                            All questions were answered, and informed consent                            was obtained. Prior Anticoagulants: The patient has                            taken Eliquis (apixaban), last dose was 2 days                            prior to procedure. ASA Grade Assessment: III - A                            patient with severe systemic disease. After  reviewing the risks and benefits, the patient was                            deemed in satisfactory condition to undergo the                            procedure.                           After obtaining informed consent, the endoscope was                            passed under direct vision. Throughout the   procedure, the patient's blood pressure, pulse, and                            oxygen saturations were monitored continuously. The                            GIF-H190 ([id number] Olympus adult EGD was introduced                            through the mouth, and advanced to the second part                            of duodenum. The upper GI endoscopy was                            accomplished without difficulty. The patient                            tolerated the procedure well. Scope In: Scope Out: Findings:      A non-obstructing and mild Schatzki ring was found in the lower third of       the esophagus.      A 2 cm hiatal hernia was present.      A few localized, small non-bleeding erosions were found in the cardia       and in the gastric fundus. There were no stigmata of recent bleeding.       Biopsies were taken with a cold forceps for histology and Helicobacter       pylori testing (body, antrum, incisura, fundus).      The exam of the stomach was otherwise normal.      Localized mild inflammation characterized by erosions and erythema was       found in the duodenal bulb.      A single small angioectasia with typical arborization was found in the       second portion of the duodenum. Fulguration to ablate the lesion to       prevent bleeding by argon plasma at 0.5 liters/minute and 20 watts was       successful.      The exam of the duodenum was otherwise normal. Impression:               - Non-obstructing and mild Schatzki ring.                           -  2 cm hiatal hernia.                           - Non-bleeding erosive gastropathy in proximal                            stomach. Biopsied.                           - Mild bulbar duodenitis.                           - A single angioectasia in the duodenum. Treated                            with argon plasma coagulation (APC). Moderate Sedation:      N/A Recommendation:           - Patient has a contact number available  for                            emergencies. The signs and symptoms of potential                            delayed complications were discussed with the                            patient. Return to normal activities tomorrow.                            Written discharge instructions were provided to the                            patient.                           - Resume previous diet.                           - Continue present medications.                           - Would hold Eliquis at least until tomorrow.                           - Daily PPI.                           - Await pathology results.                           - Would plan daily PPI, monitor Hgb to ensure                            improvement. If further decline, then would                            consider  further endoscopic evaluation (video                            capsule and possible colonoscopy) versus holding                            anticoagulation and observation. Procedure Code(s):        --- Professional ---                           [phone], 34, Esophagogastroduodenoscopy, flexible,                            transoral; with control of bleeding, any method                           43239, Esophagogastroduodenoscopy, flexible,                            transoral; with biopsy, single or multiple Diagnosis Code(s):        --- Professional ---                           K22.2, Esophageal obstruction                           K44.9, Diaphragmatic hernia without obstruction or                            gangrene                           K31.89, Other diseases of stomach and duodenum                           K29.80, Duodenitis without bleeding                           K31.819, Angiodysplasia of stomach and duodenum                            without bleeding                           D62, Acute posthemorrhagic anemia                           K92.1, Melena (includes Hematochezia) CPT copyright 2018  American Medical Association. All rights reserved. The codes documented in this report are preliminary and upon coder review may  be revised to meet current compliance requirements. Jerene Bears, MD 04/07/2018 1:23:52 PM This report has been signed electronically. Number of Addenda: 0

## 2018-04-07 NOTE — Progress Notes (Signed)
Progress Note  Patient Name: Crystal Cooper Date of Encounter: 04/07/2018  Primary Cardiologist: Dr Stanford Breed  Subjective   Mild dyspnea; no CP  Inpatient Medications    Scheduled Meds: . sodium chloride   Intravenous Once  . amLODipine  5 mg Oral Daily  . calcium-vitamin D  1 tablet Oral Daily  . ferrous sulfate  325 mg Oral BID WC  . furosemide  60 mg Oral Daily  . [START ON 04/08/2018] Influenza vac split quadrivalent PF  0.5 mL Intramuscular Tomorrow-1000  . multivitamin with minerals  1 tablet Oral Daily   Continuous Infusions:  PRN Meds: albuterol, docusate sodium   Vital Signs    Vitals:   04/06/18 2100 04/07/18 0044 04/07/18 0059 04/07/18 0757  BP: (!) 141/72 (!) 153/80  (!) 152/74  Pulse: 82 97  81  Resp: 19   18  Temp: (!) 97.5 F (36.4 C)   (!) 97.5 F (36.4 C)  TempSrc: Oral   Oral  SpO2: 97% 93% 100% 100%  Weight:      Height:        Intake/Output Summary (Last 24 hours) at 04/07/2018 0906 Last data filed at 04/07/2018 0600 Gross per 24 hour  Intake 540 ml  Output 950 ml  Net -410 ml   Filed Weights   04/06/18 0702  Weight: 50.8 kg    Telemetry    Sinus- Personally Reviewed   Physical Exam   GEN: No acute distress.   Neck: Positive JVD Cardiac: RRR, 1/6 systolic murmur Respiratory: Clear to auscultation bilaterally. GI: Soft, nontender, non-distended  MS: No edema Neuro:  Nonfocal  Psych: Normal affect   Labs    Chemistry Recent Labs  Lab 04/06/18 0633 04/07/18 0516  NA 133* 137  K 3.2* 4.1  CL 98 104  CO2 25 25  GLUCOSE 110* 110*  BUN 11 10  CREATININE 1.01* 1.01*  CALCIUM 8.2* 8.2*  PROT  --  6.6  ALBUMIN  --  3.5  AST  --  25  ALT  --  18  ALKPHOS  --  54  BILITOT  --  1.7*  GFRNONAA 48* 48*  GFRAA 55* 55*  ANIONGAP 10 8     Hematology Recent Labs  Lab 04/06/18 0633 04/06/18 1848  WBC 4.6  --   RBC 2.34*  --   HGB 5.4* 9.1*  HCT 18.1* 28.4*  MCV 77.4*  --   MCH 23.1*  --   MCHC 29.8*  --     RDW 14.7  --   PLT 215  --     Cardiac Enzymes Recent Labs  Lab 04/06/18 1848  TROPONINI 0.06*    Recent Labs  Lab 04/06/18 0637  TROPIPOC 0.03     BNP Recent Labs  Lab 04/06/18 0732 04/06/18 1848  BNP 521.2* 790.9*      Radiology    Dg Chest 2 View  Result Date: 04/06/2018 CLINICAL DATA:  Shortness of breath for 2 weeks.  Cardiac history. EXAM: CHEST - 2 VIEW COMPARISON:  10/27/2015.  CT 07/06/2016. FINDINGS: Hyperinflation. Osteopenia. Dual lead pacer. Midline trachea. Moderate cardiomegaly. Atherosclerosis in the transverse aorta. Aortic valve repair. No pleural effusion or pneumothorax. No lobar consolidation. Moderate lower and left-sided predominant interstitial thickening is felt to be similar, given differences in technique. IMPRESSION: Hyperinflation and chronic interstitial thickening, most consistent with COPD/chronic bronchitis. No convincing evidence of acute superimposed process. Aortic Atherosclerosis (ICD10-I70.0). Electronically Signed   By: Adria Devon.D.  On: 04/06/2018 07:23    Patient Profile     84 y.o. female with past medical history of TAVR, pacemaker, atrial fibrillation/atrial flutter admitted with dyspnea and found to be severely anemic.  Assessment & Plan    1 dyspnea-likely secondary to anemia.  She feels some improvement following transfusion.  She does have jugular venous distention this morning and has chronic diastolic congestive heart failure.  Continue Lasix at present dose.  Follow renal function.  2 chronic diastolic congestive heart failure-jugular venous distention noted this morning.  Continue Lasix and follow.  3 status post TAVR-follow-up echocardiogram pending.  4 acute blood loss anemia-improved with transfusion.  GI evaluation in process.  5 paroxysmal atrial fibrillation-she is in sinus rhythm.  Apixaban on hold because of GI bleed.  For questions or updates, please contact [place] Please consult  [email or web address] for contact info under        Signed, [name], MD  04/07/2018, 9:06 AM

## 2018-04-07 NOTE — Progress Notes (Signed)
   04/06/18 2006  Complaints & Interventions  Complains of Other (Comment) (critical troponin 0.06)  Provider Notification  Provider Name/Title Card Fellow  Date Provider Notified 04/06/18  Time Provider Notified 2004  Notification Type Page  Notification Reason Other (Comment) (critical troponin 0.06)  Response Other (Comment) (Continue to monitor - troponins cancelled)  Date of Provider Response 04/06/18  Time of Provider Response 2015   Will continue to monitor patient.  Earleen Reaper RN-BC, [employer]

## 2018-04-07 NOTE — Progress Notes (Signed)
  Echocardiogram 2D Echocardiogram has been performed.  Crystal Cooper 04/07/2018, 8:38 AM

## 2018-04-07 NOTE — Progress Notes (Signed)
   04/07/18 0049  Provider Notification  Provider Name/Title [facility]  Date Provider Notified 04/07/18  Time Provider Notified [phone]  Notification Type Page  Notification Reason Change in status (SOB - O2 92% RA - need order for oxygen)  Response See new orders  Date of Provider Response 04/07/18  Time of Provider Response 0053   Patient c/o increasing SOB.  Unable to talk in complete sentences.  Upon auscultation, she sounds tight.  She was 92% on RA.  Placed on 1 LPM O2 via [place] and sats came up to 98%.  She states she gets like this when her Asthma flares up.  PRN albuterol treatment given by Respiratory.  [facility] made aware.  Order received for prn oxygen.  Patient is currently resting comfortably.  Will continue to monitor patient.  Earleen Reaper RN-BC, [employer]

## 2018-04-07 NOTE — Progress Notes (Signed)
Progress Note    Crystal Cooper  ALP:[id number] DOB: Dec 07, 1926  DOA: 04/06/2018 PCP: Eustaquio Maize, MD    Brief Narrative:    Medical records reviewed and are as summarized below:  Crystal Cooper is an 84 y.o. female with past medical history significant for atrial fibrillation for which patient takes Eliquis 2.5 mg p.o. twice daily, supraventricular tachycardias, hypertension, hearing loss, diastolic congestive heart failure as per echocardiogram done in 2015, moderate to severe aortic and anemia.  Patient is known to the cardiologist, Dr. Stanford Breed.  Patient was seen alongside patient's daughter.  Patient is hard of hearing.  Patient presents with fatigue, shortness of breath, dyspnea on exertion and orthopnea.  Apparently, this has been going on for a long time.  On presentation to the hospital, lab work done revealed hemoglobin of 5.4 g/dL, hemoglobin was 11.7 g/dL 7 months ago.  Fecal occult blood he said to be positive.  Potassium is 3.2 mmol/L, but patient is on Lasix 60 mg p.o. once daily.  Chest x-ray revealed "Hyperinflation and chronic interstitial thickening, most consistent with COPD/chronic bronchitis. No convincing evidence of acute superimposed process".  No headache, and no neck pain, and no fever chills, no URI symptoms, no chest pain, no GI symptoms and no urinary symptoms.  Hospitalist service has been called to admit patient for further assessment and management.  Assessment/Plan:   Active Problems:   Symptomatic anemia  Symptomatic anemia Admission Hemoglobin is 5.4 g/dL Fecal occult blood is positive s/p 2 units of packed red blood cells Hold Eliquis For EGD today  History of diastolic dysfunction/aortic stenosis: Last visualized echocardiogram was in 2015. Moderate to severe left aortic stenosis noted Appreciate cardiology consult -continue lasix -echo done- pending reading  P. Atrial fibrillation: In sinus Eliquis to be  held.  Hypertension: Adjust meds as able  Asthma PRN nebulizers  Family Communication/Anticipated D/C date and plan/Code Status   DVT prophylaxis: scd Code Status: Full Code.  Family Communication: none at bedside Disposition Plan: pending EGD   Medical Consultants:    GI  cardiology   Anti-Infectives:    None  Subjective:   Had episode of shortness of breath last PM that improved with nebulizer  Objective:    Vitals:   04/06/18 2100 04/07/18 0044 04/07/18 0059 04/07/18 0757  BP: (!) 141/72 (!) 153/80  (!) 152/74  Pulse: 82 97  81  Resp: 19   18  Temp: (!) 97.5 F (36.4 C)   (!) 97.5 F (36.4 C)  TempSrc: Oral   Oral  SpO2: 97% 93% 100% 100%  Weight:      Height:        Intake/Output Summary (Last 24 hours) at 04/07/2018 1123 Last data filed at 04/07/2018 0900 Gross per 24 hour  Intake 540 ml  Output 1750 ml  Net -1210 ml   Filed Weights   04/06/18 3532  Weight: 50.8 kg    Exam: In bed, appears stated age No increased work of breathing, no wheezing +BS, soft, NT A+Ox3, mildly hard of hearing Rrr, no LE edema  Data Reviewed:   I have personally reviewed following labs and imaging studies:  Labs: Labs show the following:   Basic Metabolic Panel: Recent Labs  Lab 04/06/18 0633 04/07/18 0516  NA 133* 137  K 3.2* 4.1  CL 98 104  CO2 25 25  GLUCOSE 110* 110*  BUN 11 10  CREATININE 1.01* 1.01*  CALCIUM 8.2* 8.2*   GFR Estimated Creatinine Clearance:  29.9 mL/min (A) (by C-G formula based on SCr of 1.01 mg/dL (H)). Liver Function Tests: Recent Labs  Lab 04/07/18 0516  AST 25  ALT 18  ALKPHOS 54  BILITOT 1.7*  PROT 6.6  ALBUMIN 3.5   No results for input(s): LIPASE, AMYLASE in the last 168 hours. No results for input(s): AMMONIA in the last 168 hours. Coagulation profile Recent Labs  Lab 04/07/18 0516  INR 1.17    CBC: Recent Labs  Lab 04/06/18 0633 04/06/18 1848  WBC 4.6  --   HGB 5.4* 9.1*  HCT 18.1* 28.4*   MCV 77.4*  --   PLT 215  --    Cardiac Enzymes: Recent Labs  Lab 04/06/18 1848  TROPONINI 0.06*   BNP (last 3 results) No results for input(s): PROBNP in the last 8760 hours. CBG: No results for input(s): GLUCAP in the last 168 hours. D-Dimer: No results for input(s): DDIMER in the last 72 hours. Hgb A1c: No results for input(s): HGBA1C in the last 72 hours. Lipid Profile: No results for input(s): CHOL, HDL, LDLCALC, TRIG, CHOLHDL, LDLDIRECT in the last 72 hours. Thyroid function studies: No results for input(s): TSH, T4TOTAL, T3FREE, THYROIDAB in the last 72 hours.  Invalid input(s): FREET3 Anemia work up: Recent Labs    04/06/18 1848  VITAMINB12 759  FERRITIN 6*  TIBC 482*  IRON 408*   Sepsis Labs: Recent Labs  Lab 04/06/18 0633  WBC 4.6    Microbiology No results found for this or any previous visit (from the past 240 hour(s)).  Procedures and diagnostic studies:  Dg Chest 2 View  Result Date: 04/06/2018 CLINICAL DATA:  Shortness of breath for 2 weeks.  Cardiac history. EXAM: CHEST - 2 VIEW COMPARISON:  10/27/2015.  CT 07/06/2016. FINDINGS: Hyperinflation. Osteopenia. Dual lead pacer. Midline trachea. Moderate cardiomegaly. Atherosclerosis in the transverse aorta. Aortic valve repair. No pleural effusion or pneumothorax. No lobar consolidation. Moderate lower and left-sided predominant interstitial thickening is felt to be similar, given differences in technique. IMPRESSION: Hyperinflation and chronic interstitial thickening, most consistent with COPD/chronic bronchitis. No convincing evidence of acute superimposed process. Aortic Atherosclerosis (ICD10-I70.0). Electronically Signed   By: Abigail Miyamoto M.D.   On: 04/06/2018 07:23    Medications:   . sodium chloride   Intravenous Once  . amLODipine  5 mg Oral Daily  . calcium-vitamin D  1 tablet Oral Daily  . ferrous sulfate  325 mg Oral BID WC  . furosemide  60 mg Oral Daily  . [START ON 04/08/2018]  Influenza vac split quadrivalent PF  0.5 mL Intramuscular Tomorrow-1000  . multivitamin with minerals  1 tablet Oral Daily   Continuous Infusions:   LOS: 0 days   Geradine Girt  [facility]   *Please refer to [email or web address], password [email or web address] to get updated schedule on who will round on this patient, as hospitalists switch teams weekly. If 7PM-7AM, please contact night-coverage at [email or web address], password [email or web address] for any overnight needs.  04/07/2018, 11:23 AM

## 2018-04-07 NOTE — Progress Notes (Signed)
   04/07/18 0035  Pain Assessment  Pain Scale 0-10  Pain Score 5  Pain Type Acute pain  Pain Location Leg  Pain Orientation Right;Lower  Pain Descriptors / Indicators Cramping  Pain Frequency Intermittent  Pain Onset Sudden  Patients Stated Pain Goal 0  Pain Intervention(s) MD notified (Comment)  Complaints & Interventions  Complains of Coughing  Provider Notification  Provider Name/Title [facility]  Date Provider Notified 04/07/18  Time Provider Notified [phone]  Notification Type Page  Notification Reason Requested by patient/family (right lower leg cramping and coughing)  Response See new orders  Date of Provider Response 04/07/18  Time of Provider Response 0045   Patient complained of coughing and leg cramping.  Orders received for Tessalon and Robaxin; which were given.  Will continue to monitor patient.  Earleen Reaper RN-BC, [employer]

## 2018-04-07 NOTE — Progress Notes (Signed)
Patient for EGD today for evaluation of anemia. She looks and feels okay -VSS -Hgb up from 5.4 to 9.1 -Cardiology has already evaluated this am. Patient is in NSR. Will proceed with EGD this am.   Tye Savoy, NP

## 2018-04-07 NOTE — Anesthesia Preprocedure Evaluation (Addendum)
Anesthesia Evaluation    Reviewed: Allergy & Precautions, Patient's Chart, lab work & pertinent test results  History of Anesthesia Complications Negative for: history of anesthetic complications  Airway Mallampati: III  TM Distance: >3 FB Neck ROM: Full    Dental  (+) Lower Dentures, Upper Dentures   Pulmonary shortness of breath and with exertion, asthma ,    breath sounds clear to auscultation       Cardiovascular hypertension, Pt. on medications +CHF  negative cardio ROS  + dysrhythmias Atrial Fibrillation and Supra Ventricular Tachycardia + Valvular Problems/Murmurs MVP and AS  Rhythm:Irregular Rate:Normal   S/p TAVR  '18 TTE - EF 60% to 65%. A TAVR bioprosthesis was present and functioning normally. LA was mildly dilated. RV cavity size was mildly dilated. RA was moderately dilated.    Neuro/Psych negative neurological ROS  negative psych ROS   GI/Hepatic negative GI ROS, Neg liver ROS,   Endo/Other  negative endocrine ROS  Renal/GU negative Renal ROS  negative genitourinary   Musculoskeletal negative musculoskeletal ROS (+) Arthritis ,   Abdominal   Peds  Hematology negative hematology ROS (+) anemia ,   Anesthesia Other Findings   Reproductive/Obstetrics                            Anesthesia Physical Anesthesia Plan  ASA: III  Anesthesia Plan: MAC   Post-op Pain Management:    Induction: Intravenous  PONV Risk Score and Plan: 2 and Propofol infusion and Treatment may vary due to age or medical condition  Airway Management Planned: Nasal Cannula and Natural Airway  Additional Equipment: None  Intra-op Plan:   Post-operative Plan:   Informed Consent: I have reviewed the patients History and Physical, chart, labs and discussed the procedure including the risks, benefits and alternatives for the proposed anesthesia with the patient or authorized representative who has  indicated his/her understanding and acceptance.     Plan Discussed with: CRNA and Anesthesiologist  Anesthesia Plan Comments:        Anesthesia Quick Evaluation

## 2018-04-07 NOTE — Anesthesia Postprocedure Evaluation (Signed)
Anesthesia Post Note  Patient: Crystal Cooper  Procedure(s) Performed: ESOPHAGOGASTRODUODENOSCOPY (EGD) WITH PROPOFOL (N/A ) HOT HEMOSTASIS (ARGON PLASMA COAGULATION/BICAP) (N/A ) BIOPSY     Patient location during evaluation: PACU Anesthesia Type: MAC Level of consciousness: awake and alert Pain management: pain level controlled Vital Signs Assessment: post-procedure vital signs reviewed and stable Respiratory status: spontaneous breathing, nonlabored ventilation and respiratory function stable Cardiovascular status: stable and blood pressure returned to baseline Anesthetic complications: no    Last Vitals:  Vitals:   04/07/18 2029 04/07/18 2036  BP:  110/62  Pulse:  90  Resp:  18  Temp:  36.6 C  SpO2: 95% 97%    Last Pain:  Vitals:   04/07/18 2036  TempSrc: Oral  PainSc:                  [name]

## 2018-04-07 NOTE — Transfer of Care (Signed)
Immediate Anesthesia Transfer of Care Note  Patient: Crystal Cooper  Procedure(s) Performed: ESOPHAGOGASTRODUODENOSCOPY (EGD) WITH PROPOFOL (N/A ) HOT HEMOSTASIS (ARGON PLASMA COAGULATION/BICAP) (N/A ) BIOPSY  Patient Location: PACU and Endoscopy Unit  Anesthesia Type:MAC  Level of Consciousness: drowsy and patient cooperative  Airway & Oxygen Therapy: Patient Spontanous Breathing and Patient connected to nasal cannula oxygen  Post-op Assessment: Report given to RN and Post -op Vital signs reviewed and stable  Post vital signs: Reviewed and stable  Last Vitals:  Vitals Value Taken Time  BP 112/45 04/07/2018  1:11 PM  Temp    Pulse 69 04/07/2018  1:12 PM  Resp 30 04/07/2018  1:12 PM  SpO2 100 % 04/07/2018  1:12 PM  Vitals shown include unvalidated device data.  Last Pain:  Vitals:   04/07/18 1204  TempSrc: Oral  PainSc: 0-No pain      Patients Stated Pain Goal: 0 (93/81/82 9937)  Complications: No apparent anesthesia complications

## 2018-04-08 ENCOUNTER — Encounter (HOSPITAL_COMMUNITY): Payer: Self-pay | Admitting: Internal Medicine

## 2018-04-08 ENCOUNTER — Encounter: Payer: Self-pay | Admitting: Internal Medicine

## 2018-04-08 DIAGNOSIS — D649 Anemia, unspecified: Secondary | ICD-10-CM | POA: Diagnosis not present

## 2018-04-08 DIAGNOSIS — Z7901 Long term (current) use of anticoagulants: Secondary | ICD-10-CM

## 2018-04-08 DIAGNOSIS — K922 Gastrointestinal hemorrhage, unspecified: Secondary | ICD-10-CM | POA: Diagnosis not present

## 2018-04-08 DIAGNOSIS — K921 Melena: Secondary | ICD-10-CM

## 2018-04-08 DIAGNOSIS — R0600 Dyspnea, unspecified: Secondary | ICD-10-CM

## 2018-04-08 DIAGNOSIS — D509 Iron deficiency anemia, unspecified: Secondary | ICD-10-CM

## 2018-04-08 LAB — BASIC METABOLIC PANEL
ANION GAP: 9 (ref 5–15)
BUN: 17 mg/dL (ref 8–23)
CALCIUM: 8.4 mg/dL — AB (ref 8.9–10.3)
CO2: 26 mmol/L (ref 22–32)
Chloride: 100 mmol/L (ref 98–111)
Creatinine, Ser: 1.09 mg/dL — ABNORMAL HIGH (ref 0.44–1.00)
GFR calc Af Amer: 51 mL/min — ABNORMAL LOW (ref >60)
GFR, EST NON AFRICAN AMERICAN: 44 mL/min — AB (ref >60)
Glucose, Bld: 105 mg/dL — ABNORMAL HIGH (ref 70–99)
Potassium: 3.7 mmol/L (ref 3.5–5.1)
SODIUM: 135 mmol/L (ref 135–145)

## 2018-04-08 LAB — CBC
HCT: 29 % — ABNORMAL LOW (ref 36.0–46.0)
Hemoglobin: 9.1 g/dL — ABNORMAL LOW (ref 12.0–15.0)
MCH: 25.3 pg — ABNORMAL LOW (ref 26.0–34.0)
MCHC: 31.4 g/dL (ref 30.0–36.0)
MCV: 80.6 fL (ref 80.0–100.0)
PLATELETS: 182 10*3/uL (ref 150–400)
RBC: 3.6 MIL/uL — AB (ref 3.87–5.11)
RDW: 16.9 % — ABNORMAL HIGH (ref 11.5–15.5)
WBC: 7.1 10*3/uL (ref 4.0–10.5)
nRBC: 0 % (ref 0.0–0.2)

## 2018-04-08 MED ORDER — SODIUM CHLORIDE 0.9 % IV SOLN
510.0000 mg | Freq: Once | INTRAVENOUS | Status: AC
  Administered 2018-04-08: 510 mg via INTRAVENOUS
  Filled 2018-04-08: qty 17

## 2018-04-08 MED ORDER — FERROUS SULFATE 325 (65 FE) MG PO TABS: 325.0000 mg | ORAL_TABLET | Freq: Two times a day (BID) | ORAL | 3 refills | Status: DC

## 2018-04-08 MED ORDER — PANTOPRAZOLE SODIUM 40 MG PO TBEC: 40.0000 mg | DELAYED_RELEASE_TABLET | Freq: Every day | ORAL | 0 refills | Status: DC

## 2018-04-08 NOTE — Progress Notes (Signed)
Patient discharged to home. After visit summary reviewed with patient and daughter.  Capable of re verbalizing medications and follow up appointments. Pt remains stable. No signs and symptoms of distress. Educated to return to ER in the event of SOB, dizziness, chest pain, or fainting. Mady Gemma, RN

## 2018-04-08 NOTE — Progress Notes (Signed)
MEDICATION RELATED CONSULT NOTE - FOLLOW UP   Pharmacy Consult for IV iron Indication: anemia  Allergies  Allergen Reactions  . Hctz [Hydrochlorothiazide] Other (See Comments)    Pt was ill and affected kidneys   . Codeine Other (See Comments)    Unknown  Made pt feel ill, has not had any problems since 1977    Patient Measurements: Height: 5\' 2"  (157.5 cm) Weight: 112 lb (50.8 kg) IBW/kg (Calculated) : 50.1  Labs: Recent Labs    04/06/18 0633 04/06/18 1848 04/07/18 0516 04/08/18 0402 04/08/18 0806  WBC 4.6  --   --   --  7.1  HGB 5.4* 9.1*  --   --  9.1*  HCT 18.1* 28.4*  28.0*  --   --  29.0*  PLT 215  --   --   --  182  CREATININE 1.01*  --  1.01* 1.09*  --   ALBUMIN  --   --  3.5  --   --   PROT  --   --  6.6  --   --   AST  --   --  25  --   --   ALT  --   --  18  --   --   ALKPHOS  --   --  54  --   --   BILITOT  --   --  1.7*  --   --    Estimated Creatinine Clearance: 27.7 mL/min (A) (by C-G formula based on SCr of 1.09 mg/dL (H)).   Assessment:  84 yr old female admitted 10/13 with symptomatic anemia. Was on Eliquis 2.5 mg BID prior to admission for atrial fibrillation, which has been held. Last Eliquis dose 10/12 at 7pm.    Pharmacy asked to dose IV iron for target Hgb 13.  Hgb 9.1 today. Hgb 5.4 on admit, 2 units PRBCs given.  Fe 408 and tsat 85 on 10/13 pm but after transfusion. Ferritin 6.    Iron deficit 1085 mg.  Discussed with GI.  On Ferrous sulfate 325 mg BID.  Goal of Therapy:   target Hgb 13  Plan:   Feraheme 510 mg IV x 1 today.  Repeat dose in 3-8 days.  Arty Baumgartner, [place] Pager: [phone] or phone: [phone] 04/08/2018,11:24 AM

## 2018-04-08 NOTE — Care Management Note (Signed)
Case Management Note  Patient Details  Name: Crystal Cooper MRN: [id number] Date of Birth: 1927-02-10  Subjective/Objective:                 obs for anemia   Action/Plan:  Will dc to home today, self acre, RW to be delivered to room prior to discharge. Patient states she has transportation home.  Expected Discharge Date:                  Expected Discharge Plan:  Home/Self Care  In-House Referral:     Discharge planning Services  CM Consult  Post Acute Care Choice:  Durable Medical Equipment Choice offered to:     DME Arranged:  Walker rolling DME Agency:  [place]:    [facility] Agency:     Status of Service:  Completed, signed off  If discussed at [place] of Stay Meetings, dates discussed:    Additional Comments:  Carles Collet, RN 04/08/2018, 11:49 AM

## 2018-04-08 NOTE — Discharge Summary (Signed)
Physician Discharge Summary  Crystal Cooper KXF:[id number] DOB: Apr 03, 1927 DOA: 04/06/2018  PCP: Eustaquio Maize, MD  Admit date: 04/06/2018 Discharge date: 04/08/2018  Admitted From: home Discharge disposition: home   Recommendations for Outpatient Follow-Up:   1. Cbc 1 week 2. 2nd dose of IV Fe in 1 week 3. Daily PPC 4. Holding eliquis for now-- cardiology follow up   Discharge Diagnosis:   Active Problems:   Symptomatic anemia   Melena   Angiodysplasia of duodenum   Gastritis and gastroduodenitis   Iron deficiency anemia    Discharge Condition: Improved.  Diet recommendation:  Regular.  Wound care: None.  Code status: Full.   History of Present Illness:   Patient is an 84 year old Caucasian female, with past medical history significant for atrial fibrillation for which patient takes Eliquis 2.5 mg p.o. twice daily, supraventricular tachycardias, hypertension, hearing loss, diastolic congestive heart failure as per echocardiogram done in 2015, moderate to severe aortic and anemia.  Patient is known to the cardiologist, Dr. Stanford Breed.  Patient was seen alongside patient's daughter.  Patient is hard of hearing.  Patient presents with fatigue, shortness of breath, dyspnea on exertion and orthopnea.  Apparently, this has been going on for a long time.  On presentation to the hospital, lab work done revealed hemoglobin of 5.4 g/dL, hemoglobin was 11.7 g/dL 7 months ago.  Fecal occult blood he said to be positive.  Potassium is 3.2 mmol/L, but patient is on Lasix 60 mg p.o. once daily.  Chest x-ray revealed "Hyperinflation and chronic interstitial thickening, most consistent with COPD/chronic bronchitis. No convincing evidence of acute superimposed process".  No headache, and no neck pain, and no fever chills, no URI symptoms, no chest pain, no GI symptoms and no urinary symptoms.  Hospitalist service has been called to admit patient for further assessment and  management.    Hospital Course by Problem:   Symptomatic anemia due to severe Fe def anemia Admission Hemoglobin was 5.4 g/dL Fecal occult blood is positive s/p 2 units of packed red blood cells with improvement in Hgb -s/p IV Fe x 1, will need another dose in 1 week -cbc 1 week -GI and cardiology follow up   History of diastolic dysfunction/aortic stenosis: Last visualized echocardiogram was in 2015. Moderate to severe left aortic stenosis noted Appreciate cardiology consult -continue lasix -echo shows normal LV function and normal AV   P. Atrial fibrillation: In sinus Eliquis to be held until follow up with cardiology.  Hypertension: Well controlled  Asthma PRN nebulizers    Medical Consultants:    GI cardiology  Discharge Exam:   Vitals:   04/08/18 0408 04/08/18 0900  BP: 133/67 125/70  Pulse: 60 69  Resp: 18 18  Temp: (!) 97.5 F (36.4 C) 97.6 F (36.4 C)  SpO2: 97% 97%   Vitals:   04/07/18 2029 04/07/18 2036 04/08/18 0408 04/08/18 0900  BP:  110/62 133/67 125/70  Pulse:  90 60 69  Resp:  18 18 18   Temp:  97.9 F (36.6 C) (!) 97.5 F (36.4 C) 97.6 F (36.4 C)  TempSrc:  Oral Oral Oral  SpO2: 95% 97% 97% 97%  Weight:      Height:        General exam: Appears calm and comfortable.  The results of significant diagnostics from this hospitalization (including imaging, microbiology, ancillary and laboratory) are listed below for reference.     Procedures and Diagnostic Studies:   Dg Chest 2 View  Result Date: 04/06/2018 CLINICAL DATA:  Shortness of breath for 2 weeks.  Cardiac history. EXAM: CHEST - 2 VIEW COMPARISON:  10/27/2015.  CT 07/06/2016. FINDINGS: Hyperinflation. Osteopenia. Dual lead pacer. Midline trachea. Moderate cardiomegaly. Atherosclerosis in the transverse aorta. Aortic valve repair. No pleural effusion or pneumothorax. No lobar consolidation. Moderate lower and left-sided predominant interstitial thickening is felt to  be similar, given differences in technique. IMPRESSION: Hyperinflation and chronic interstitial thickening, most consistent with COPD/chronic bronchitis. No convincing evidence of acute superimposed process. Aortic Atherosclerosis (ICD10-I70.0). Electronically Signed   By: Abigail Miyamoto M.D.   On: 04/06/2018 07:23     Labs:   Basic Metabolic Panel: Recent Labs  Lab 04/06/18 0633 04/07/18 0516 04/08/18 0402  NA 133* 137 135  K 3.2* 4.1 3.7  CL 98 104 100  CO2 25 25 26   GLUCOSE 110* 110* 105*  BUN 11 10 17   CREATININE 1.01* 1.01* 1.09*  CALCIUM 8.2* 8.2* 8.4*   GFR Estimated Creatinine Clearance: 27.7 mL/min (A) (by C-G formula based on SCr of 1.09 mg/dL (H)). Liver Function Tests: Recent Labs  Lab 04/07/18 0516  AST 25  ALT 18  ALKPHOS 54  BILITOT 1.7*  PROT 6.6  ALBUMIN 3.5   No results for input(s): LIPASE, AMYLASE in the last 168 hours. No results for input(s): AMMONIA in the last 168 hours. Coagulation profile Recent Labs  Lab 04/07/18 0516  INR 1.17    CBC: Recent Labs  Lab 04/06/18 0633 04/06/18 1848 04/08/18 0806  WBC 4.6  --  7.1  HGB 5.4* 9.1* 9.1*  HCT 18.1* 28.4*  28.0* 29.0*  MCV 77.4*  --  80.6  PLT 215  --  182   Cardiac Enzymes: Recent Labs  Lab 04/06/18 1848  TROPONINI 0.06*   BNP: Invalid input(s): POCBNP CBG: No results for input(s): GLUCAP in the last 168 hours. D-Dimer No results for input(s): DDIMER in the last 72 hours. Hgb A1c No results for input(s): HGBA1C in the last 72 hours. Lipid Profile No results for input(s): CHOL, HDL, LDLCALC, TRIG, CHOLHDL, LDLDIRECT in the last 72 hours. Thyroid function studies No results for input(s): TSH, T4TOTAL, T3FREE, THYROIDAB in the last 72 hours.  Invalid input(s): FREET3 Anemia work up Recent Labs    04/06/18 1848  VITAMINB12 759  FERRITIN 6*  TIBC 482*  IRON 408*   Microbiology No results found for this or any previous visit (from the past 240 hour(s)).   Discharge  Instructions:   Discharge Instructions    Discharge instructions   Complete by:  As directed    Will need another dose of IV iron in 1 week to replenish your stores of iron Stop eliquis for now-- will need to follow up with your cardiologist Please have family stay with you for a few nights post-discharge from the hospital Soft diet   Increase activity slowly   Complete by:  As directed      Allergies as of 04/08/2018      Reactions   Hctz [hydrochlorothiazide] Other (See Comments)   Pt was ill and affected kidneys   Codeine Other (See Comments)   Unknown  Made pt feel ill, has not had any problems since 1977      Medication List    STOP taking these medications   apixaban 2.5 MG Tabs tablet Commonly known as:  ELIQUIS     TAKE these medications   acetaminophen 500 MG tablet Commonly known as:  TYLENOL Take 500 mg by mouth  every 6 (six) hours as needed for mild pain.   albuterol 108 (90 Base) MCG/ACT inhaler Commonly known as:  PROVENTIL HFA;VENTOLIN HFA Inhale 2 puffs into the lungs every 4 (four) hours as needed for wheezing or shortness of breath.   amLODipine 5 MG tablet Commonly known as:  NORVASC Take 1 tablet (5 mg total) by mouth daily.   Biotin 10 MG Caps Take 1 capsule by mouth daily.   calcium-vitamin D 500-200 MG-UNIT tablet Commonly known as:  OSCAL WITH D Take 1 tablet by mouth daily.   docusate sodium 100 MG capsule Commonly known as:  COLACE Take 100 mg by mouth daily as needed for mild constipation.   ferrous sulfate 325 (65 FE) MG tablet Take 1 tablet (325 mg total) by mouth 2 (two) times daily with a meal.   furosemide 40 MG tablet Commonly known as:  LASIX TAKE 1 & 1/2 (ONE & ONE-HALF) TABLETS BY MOUTH ONCE DAILY   multivitamin-iron-minerals-folic acid chewable tablet Chew 1 tablet by mouth daily.   pantoprazole 40 MG tablet Commonly known as:  PROTONIX Take 1 tablet (40 mg total) by mouth daily at 6 (six) AM. Start taking on:   04/09/2018            Durable Medical Equipment  (From admission, onward)         Start     Ordered   04/08/18 1031  For home use only DME Walker rolling  Once    Comments:  Youth height rolling walker  Question:  Patient needs a walker to treat with the following condition  Answer:  Anemia   04/08/18 [place] Follow up.   Why:  RW to be delivered to room prior to DC to go home w patient Contact information: [address] [place] [address] [phone]        Eustaquio Maize, MD Follow up in 1 week(s).   Specialty:  Pediatrics Why:  need CBC in 1 week and another dose of IV iron to replenish stores Contact information: [place] [address] [phone]        Lelon Perla, MD Follow up in 4 week(s).   Specialty:  Cardiology Contact information: [address] [place] [address] [phone]        Jerene Bears, MD Follow up in 2 week(s).   Specialty:  Gastroenterology Contact information: [address] [place] [address] [phone]            Time coordinating discharge: 25 min  Signed:  Geradine Girt  [facility] 04/08/2018, 12:12 PM

## 2018-04-08 NOTE — Progress Notes (Signed)
Progress Note  Patient Name: Crystal Cooper Date of Encounter: 04/08/2018  Primary Cardiologist: Dr Stanford Breed  Subjective   No CP or dyspnea  Inpatient Medications    Scheduled Meds: . sodium chloride   Intravenous Once  . amLODipine  5 mg Oral Daily  . calcium-vitamin D  1 tablet Oral Daily  . ferrous sulfate  325 mg Oral BID WC  . furosemide  60 mg Oral Daily  . Influenza vac split quadrivalent PF  0.5 mL Intramuscular Tomorrow-1000  . multivitamin with minerals  1 tablet Oral Daily  . pantoprazole  40 mg Oral Q0600   Continuous Infusions:  PRN Meds: albuterol, docusate sodium   Vital Signs    Vitals:   04/07/18 1424 04/07/18 2029 04/07/18 2036 04/08/18 0408  BP: 133/68  110/62 133/67  Pulse: 76  90 60  Resp: 18  18 18   Temp: 97.7 F (36.5 C)  97.9 F (36.6 C) (!) 97.5 F (36.4 C)  TempSrc: Oral  Oral Oral  SpO2: (!) 76% 95% 97% 97%  Weight:      Height:        Intake/Output Summary (Last 24 hours) at 04/08/2018 0853 Last data filed at 04/08/2018 0600 Gross per 24 hour  Intake 680 ml  Output 1700 ml  Net -1020 ml   Filed Weights   04/06/18 0702  Weight: 50.8 kg    Telemetry    Sinus with intermittent atrial and ventricular pacing- Personally Reviewed   Physical Exam   GEN: No acute distress. WD  Neck: supple Cardiac: RRR, 1/6 systolic murmur; no DM Respiratory: CTA GI: Soft, NT/ND MS: No edema Neuro: grossly intact  Labs    Chemistry Recent Labs  Lab 04/06/18 0633 04/07/18 0516 04/08/18 0402  NA 133* 137 135  K 3.2* 4.1 3.7  CL 98 104 100  CO2 25 25 26   GLUCOSE 110* 110* 105*  BUN 11 10 17   CREATININE 1.01* 1.01* 1.09*  CALCIUM 8.2* 8.2* 8.4*  PROT  --  6.6  --   ALBUMIN  --  3.5  --   AST  --  25  --   ALT  --  18  --   ALKPHOS  --  54  --   BILITOT  --  1.7*  --   GFRNONAA 48* 48* 44*  GFRAA 55* 55* 51*  ANIONGAP 10 8 9      Hematology Recent Labs  Lab 04/06/18 0633 04/06/18 1848  WBC 4.6  --   RBC 2.34*   --   HGB 5.4* 9.1*  HCT 18.1* 28.4*  28.0*  MCV 77.4*  --   MCH 23.1*  --   MCHC 29.8*  --   RDW 14.7  --   PLT 215  --     Cardiac Enzymes Recent Labs  Lab 04/06/18 1848  TROPONINI 0.06*    Recent Labs  Lab 04/06/18 0637  TROPIPOC 0.03     BNP Recent Labs  Lab 04/06/18 0732 04/06/18 1848  BNP 521.2* 790.9*      Patient Profile     84 y.o. female with past medical history of TAVR, pacemaker, atrial fibrillation/atrial flutter admitted with dyspnea and found to be severely anemic.  Assessment & Plan    1 dyspnea-improved.  Likely secondary to anemia at time of presentation.  Improved following transfusion.  Volume status stable this morning.  Continue present dose of Lasix.  2 chronic diastolic congestive heart failure-continue present dose of Lasix and follow.  3 status post TAVR-follow-up echocardiogram shows normal LV function and normally functioning aortic valve replacement.  4 acute blood loss anemia-improved with transfusion.  Results of EGD noted.  5 paroxysmal atrial fibrillation-patient remains in sinus rhythm.  Given recent anemia and GI bleed I would favor holding apixaban for now.  We can reassess as an outpatient but risk may outweigh benefit.   Patient can be discharged from a cardiac standpoint. [facility] will sign off.   Medication Recommendations:  DC apixaban Other recommendations (labs, testing, etc):  No other lab testing Follow up as an outpatient: Dr. Stanford Breed 4 to 6 weeks following discharge  For questions or updates, please contact [place] Please consult [email or web address] for contact info under        Signed, [name], MD  04/08/2018, 8:53 AM

## 2018-04-08 NOTE — Progress Notes (Signed)
Daily Rounding Note  04/08/2018, 9:58 AM  LOS: 0 days   SUBJECTIVE:   Chief complaint: Acute and chronic blood loss anemia.    Patient walking 50 feet, able to climb up and down 3 stairs.  Does not feel winded.  A little bit weaker than normal but rapidly returning to baseline endurance.  Dark stool yesterday, none today.  No nausea or vomiting. Tolerating soft diet.  OBJECTIVE:         Vital signs in last 24 hours:    Temp:  [97.5 F (36.4 C)-98.7 F (37.1 C)] 97.6 F (36.4 C) (10/15 0900) Pulse Rate:  [60-90] 69 (10/15 0900) Resp:  [18-38] 18 (10/15 0900) BP: (110-136)/(45-70) 125/70 (10/15 0900) SpO2:  [76 %-100 %] 97 % (10/15 0900) Last BM Date: 04/06/18 Filed Weights   04/06/18 0702  Weight: 50.8 kg   General: Pleasant, looks well, walking with a walker and PT assistance in the hallway.  Hard of hearing. Did not examine her extensively. Heart: Irregularly irregular, rate 83. Chest: No labored breathing. Abdomen: Not distended. Extremities: No peripheral edema. Neuro/Psych: Pleasant, cooperative.  Hard of hearing.  Fluid speech.  Fully alert and oriented.  Walking with assistance of a walker in the hall.  Intake/Output from previous day: 10/14 0701 - 10/15 0700 In: 680 [P.O.:480; I.V.:200] Out: 1700 [Urine:1700]  Intake/Output this shift: No intake/output data recorded.  Lab Results: Recent Labs    04/06/18 0633 04/06/18 1848 04/08/18 0806  WBC 4.6  --  7.1  HGB 5.4* 9.1* 9.1*  HCT 18.1* 28.4*  28.0* 29.0*  PLT 215  --  182   BMET Recent Labs    04/06/18 0633 04/07/18 0516 04/08/18 0402  NA 133* 137 135  K 3.2* 4.1 3.7  CL 98 104 100  CO2 25 25 26   GLUCOSE 110* 110* 105*  BUN 11 10 17   CREATININE 1.01* 1.01* 1.09*  CALCIUM 8.2* 8.2* 8.4*   LFT Recent Labs    04/07/18 0516  PROT 6.6  ALBUMIN 3.5  AST 25  ALT 18  ALKPHOS 54  BILITOT 1.7*   PT/INR Recent Labs     04/07/18 0516  LABPROT 14.8  INR 1.17   Hepatitis Panel No results for input(s): HEPBSAG, HCVAB, HEPAIGM, HEPBIGM in the last 72 hours.  Studies/Results: No results found.  Scheduled Meds: . sodium chloride   Intravenous Once  . amLODipine  5 mg Oral Daily  . calcium-vitamin D  1 tablet Oral Daily  . ferrous sulfate  325 mg Oral BID WC  . furosemide  60 mg Oral Daily  . multivitamin with minerals  1 tablet Oral Daily  . pantoprazole  40 mg Oral Q0600   Continuous Infusions: PRN Meds:.albuterol, docusate sodium   ASSESMENT:   *   UGIB with melena.    04/07/2018 EGD:   Nonobstructing Schatzki ring.  Hiatal hernia.  Nonbleeding erosions in the gastric cardia and fundus without stigmata of bleeding.  Duodenal bulb inflammation with erosion.  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 2007 colonoscopy with Dr. Gala Romney, normal screening study.  Aged out of routine screening colonoscopies.  *   Anemia.  S/p 2 U PRBCs.  Hgb 5.4 >> 9.1 >> 9.1,  (11.7 in 07/2017).  IDA: ferritin 6.   On oral ironBID  *    Chronic Eliquis.  Indictation Parox A. Fib. S/p TAVR.    PLAN   *  Dr Jacalyn Lefevre rec: discontinue Abixiban, may  consider restart in future but will revisit this at [place] in 4 to 6 weeks.    *  Continue 1x daily PPI.  On no PPI etc PTA.    *   Infuse Feraheme??  *   ? Home today.  Should obtain CBC in 7 to 10 days, could be done through PMD Assunta Found MD.     *    Adding Colace daily.  She is now taking oral iron and calcium supplement which are both quite constipating.  She may need laxatives, if so would recommend MiraLAX.    Crystal Cooper  04/08/2018, 9:58 AM Phone [phone]

## 2018-04-08 NOTE — Evaluation (Signed)
Physical Therapy Evaluation Patient Details Name: Crystal Cooper MRN: [id number] DOB: 12-May-1927 Today's Date: 04/08/2018   History of Present Illness  84 y.o. female with past medical history of TAVR, pacemaker, atrial fibrillation/atrial flutter admitted with dyspnea and found to be severely anemic.  Clinical Impression  Patient evaluated by Physical Therapy with no further acute PT needs identified. All education has been completed and the patient has no further questions. Pt mod I with transfers and ambulation and practiced stairs as well. O2 sats remained upper 90's, HR 85-105 bpm with exertion. Recommend family stay with her tonight and she reports that they will.  See below for any follow-up Physical Therapy or equipment needs. PT is signing off. Thank you for this referral.     Follow Up Recommendations No PT follow up    Equipment Recommendations  Rolling walker with 5" wheels(youth height)    Recommendations for Other Services       Precautions / Restrictions Precautions Precautions: None Restrictions Weight Bearing Restrictions: No      Mobility  Bed Mobility Overal bed mobility: Modified Independent                Transfers Overall transfer level: Modified independent Equipment used: Rolling walker (2 wheeled)             General transfer comment: no difficulties with sit to stand, stand to sit without use of hands  Ambulation/Gait Ambulation/Gait assistance: Modified independent (Device/Increase time) Gait Distance (Feet): 300 Feet Assistive device: Rolling walker (2 wheeled) Gait Pattern/deviations: Decreased stride length Gait velocity: age appropriate Gait velocity interpretation: 1.31 - 2.62 ft/sec, indicative of limited community ambulator General Gait Details: vc's for fwd gaze but pt with safe use of RW, no imbalance noted. O2 sats 95%, HR 85 bpm  Stairs Stairs: Yes Stairs assistance: Supervision Stair Management: One rail Right;Step  to pattern;Forwards Number of Stairs: 3 General stair comments: steps up with L each time due to discomfort R posterior calf. Discussed sequencing for descent. O2 sats 98% after stairs. HR 105 bpm and decreased after 30 secs.   Wheelchair Mobility    Modified Rankin (Stroke Patients Only)       Balance Overall balance assessment: Mild deficits observed, not formally tested                                           Pertinent Vitals/Pain Pain Assessment: No/denies pain    Home Living Family/patient expects to be discharged to:: Private residence Living Arrangements: Alone Available Help at Discharge: Family;Available 24 hours/day Type of Home: House Home Access: Stairs to enter Entrance Stairs-Rails: Left Entrance Stairs-Number of Steps: 3 Home Layout: One level Home Equipment: Cane - single point Additional Comments: pt has a walker that someone gave her but is too big for her so she doesn't use it, will plan to request a new one    Prior Function Level of Independence: Independent with assistive device(s)         Comments: walks with cane, was walking block 3x/ day until she started feeling weak     Hand Dominance   Dominant Hand: Right    Extremity/Trunk Assessment   Upper Extremity Assessment Upper Extremity Assessment: Overall WFL for tasks assessed    Lower Extremity Assessment Lower Extremity Assessment: RLE deficits/detail;Generalized weakness RLE Deficits / Details: RLE weaker than left and pt reports she has been  having some Achilles discomfort with stairs RLE Sensation: WNL RLE Coordination: WNL    Cervical / Trunk Assessment Cervical / Trunk Assessment: Kyphotic  Communication   Communication: HOH  Cognition Arousal/Alertness: Awake/alert Behavior During Therapy: WFL for tasks assessed/performed Overall Cognitive Status: Within Functional Limits for tasks assessed                                         General Comments General comments (skin integrity, edema, etc.): discussed return to activity level slowly and keeping cell phone in her pocket when she is ambulating out of house     Exercises     Assessment/Plan    PT Assessment Patent does not need any further PT services  PT Problem List         PT Treatment Interventions      PT Goals (Current goals can be found in the Care Plan section)  Acute Rehab PT Goals Patient Stated Goal: return home PT Goal Formulation: All assessment and education complete, DC therapy    Frequency     Barriers to discharge        Co-evaluation               AM-PAC PT "6 Clicks" Daily Activity  Outcome Measure Difficulty turning over in bed (including adjusting bedclothes, sheets and blankets)?: None Difficulty moving from lying on back to sitting on the side of the bed? : None Difficulty sitting down on and standing up from a chair with arms (e.g., wheelchair, bedside commode, etc,.)?: None Help needed moving to and from a bed to chair (including a wheelchair)?: None Help needed walking in hospital room?: None Help needed climbing 3-5 steps with a railing? : A Little 6 Click Score: 23    End of Session Equipment Utilized During Treatment: Gait belt Activity Tolerance: Patient tolerated treatment well Patient left: in chair;with call bell/phone within reach;with chair alarm set Nurse Communication: Mobility status PT Visit Diagnosis: Muscle weakness (generalized) (M62.81)    Time: 0762-2633 PT Time Calculation (min) (ACUTE ONLY): 22 min   Charges:   PT Evaluation $PT Eval Low Complexity: 1 Low          Leighton Roach, PT  Acute Rehab Services  Pager [phone] Office [place] 04/08/2018, 10:28 AM

## 2018-04-09 ENCOUNTER — Telehealth: Payer: Self-pay | Admitting: Pediatrics

## 2018-04-09 NOTE — Telephone Encounter (Signed)
Appt made/daughter aware

## 2018-04-15 ENCOUNTER — Encounter: Payer: Self-pay | Admitting: Pediatrics

## 2018-04-15 ENCOUNTER — Ambulatory Visit (INDEPENDENT_AMBULATORY_CARE_PROVIDER_SITE_OTHER): Payer: Medicare Other | Admitting: Pediatrics

## 2018-04-15 VITALS — BP 119/44 | HR 68 | Temp 96.7°F | Ht 62.0 in | Wt 113.6 lb

## 2018-04-15 DIAGNOSIS — Z09 Encounter for follow-up examination after completed treatment for conditions other than malignant neoplasm: Secondary | ICD-10-CM

## 2018-04-15 DIAGNOSIS — I10 Essential (primary) hypertension: Secondary | ICD-10-CM

## 2018-04-15 DIAGNOSIS — D509 Iron deficiency anemia, unspecified: Secondary | ICD-10-CM | POA: Diagnosis not present

## 2018-04-15 MED ORDER — AMLODIPINE BESYLATE 2.5 MG PO TABS: 2.5000 mg | ORAL_TABLET | Freq: Every day | ORAL | 1 refills | Status: DC

## 2018-04-15 NOTE — Progress Notes (Signed)
  Subjective:   Patient ID: Crystal Cooper, female    DOB: [birth year], 84 y.o.   MRN: [id number] CC: Anemia [facility] follow up- [facility] 10/16. Patient states she has no complaints.)  HPI: Crystal Cooper is a 84 y.o. female   Admitted to [facility] 10/13 with a hemoglobin of 5.4 and shortness of breath EGD and colonoscopy.  She received IV iron, 2 pRBC with improvement in hemoglobin.  Fecal occult blood was positive.  She had an EGD that showed single angiectasia that was ablated.   History of TAVR, atrial fibrillation.  Was on Eliquis at time of admission, now off until follow-up with cardiology in 4 weeks.   Has been feeling back to her normal self.  No pain in her stomach.  Appetite has been normal.  No lightheadedness when she stands up.  Has been having regular stools.  No bloody, maroon-colored or black stools.   Relevant past medical, surgical, family and social history reviewed. Allergies and medications reviewed and updated. Social History   Tobacco Use  Smoking Status Never Smoker  Smokeless Tobacco Never Used   ROS: Per HPI   Objective:    BP (!) 119/44   Pulse 68   Temp (!) 96.7 F (35.9 C) (Oral)   Ht _0  (1.575 m)   Wt 113 lb 9.6 oz (51.5 kg)   BMI 20.78 kg/m   Wt Readings from Last 3 Encounters:  04/15/18 113 lb 9.6 oz (51.5 kg)  04/06/18 112 lb (50.8 kg)  03/17/18 112 lb (50.8 kg)    Gen: NAD, alert, cooperative with exam, NCAT EYES: EOMI, no conjunctival injection, or no icterus CV: NRRR, normal S1/S2, no murmur, distal pulses 2+ b/l Resp: CTABL, no wheezes, normal WOB Abd: +BS, soft, NTND.  Ext: No edema, warm Neuro: Alert and oriented MSK: normal muscle bulk  Assessment & Plan:  Crystal Cooper was seen today for anemia and hospital follow-up.  Hospital records, procedure, lab work were reviewed.  Diagnoses and all orders for this visit:  Iron deficiency anemia, unspecified iron deficiency anemia type Feeling back to normal self.  We will repeat  labs today.  Has appointment next week with GI.  Their office said they would be able to send for IV iron infusion if appropriate.  Possible that angiectasia was ablated with source of bleeding.  Now off of Eliquis, going to determine restart Eliquis versus not at next appointment with cardiology.  For now we will monitor hg.  -     BMP8+EGFR -     CBC with Differential/Platelet  Essential hypertension Diastolic quite low, will decrease amlodipine to 2.5 mg.  Continue to follow blood pressures at home. Let me know if top number is <110 or >150 or if bottom number is <50 or > 90.  Follow up plan: Return in about 2 months (around 06/15/2018). Assunta Found, MD [place]

## 2018-04-15 NOTE — Patient Instructions (Signed)
Stop amlodipine 5mg . Start lower dose, amlodipine 2.5mg  once a day in the morning.  Check blood pressures at home. Let me know if top number is <110 or >150 or if bottom number is <50 or > 90

## 2018-04-16 LAB — CBC WITH DIFFERENTIAL/PLATELET
BASOS ABS: 0.1 10*3/uL (ref 0.0–0.2)
Basos: 2 %
EOS (ABSOLUTE): 0.2 10*3/uL (ref 0.0–0.4)
Eos: 5 %
Hematocrit: 27.4 % — ABNORMAL LOW (ref 34.0–46.6)
Hemoglobin: 8.7 g/dL — CL (ref 11.1–15.9)
IMMATURE GRANS (ABS): 0 10*3/uL (ref 0.0–0.1)
IMMATURE GRANULOCYTES: 0 %
LYMPHS: 23 %
Lymphocytes Absolute: 1 10*3/uL (ref 0.7–3.1)
MCH: 27 pg (ref 26.6–33.0)
MCHC: 31.8 g/dL (ref 31.5–35.7)
MCV: 85 fL (ref 79–97)
MONOCYTES: 13 %
Monocytes Absolute: 0.6 10*3/uL (ref 0.1–0.9)
NEUTROS PCT: 57 %
Neutrophils Absolute: 2.6 10*3/uL (ref 1.4–7.0)
PLATELETS: 231 10*3/uL (ref 150–450)
RBC: 3.22 x10E6/uL — AB (ref 3.77–5.28)
RDW: 19.6 % — ABNORMAL HIGH (ref 12.3–15.4)
WBC: 4.5 10*3/uL (ref 3.4–10.8)

## 2018-04-16 LAB — BMP8+EGFR
BUN/Creatinine Ratio: 13 (ref 12–28)
BUN: 15 mg/dL (ref 8–27)
CALCIUM: 8.2 mg/dL — AB (ref 8.7–10.3)
CHLORIDE: 95 mmol/L — AB (ref 96–106)
CO2: 27 mmol/L (ref 20–29)
Creatinine, Ser: 1.13 mg/dL — ABNORMAL HIGH (ref 0.57–1.00)
GFR calc Af Amer: 50 mL/min/{1.73_m2} — ABNORMAL LOW (ref >59)
GFR calc non Af Amer: 43 mL/min/{1.73_m2} — ABNORMAL LOW (ref >59)
Glucose: 96 mg/dL (ref 65–99)
POTASSIUM: 4.3 mmol/L (ref 3.5–5.2)
Sodium: 136 mmol/L (ref 134–144)

## 2018-04-18 ENCOUNTER — Telehealth: Payer: Self-pay | Admitting: Internal Medicine

## 2018-04-18 ENCOUNTER — Other Ambulatory Visit: Payer: Self-pay

## 2018-04-18 DIAGNOSIS — D509 Iron deficiency anemia, unspecified: Secondary | ICD-10-CM

## 2018-04-18 NOTE — Telephone Encounter (Signed)
Pt scheduled for Feraheme at pt care center 04/22/18@8am . Pts daughter aware of appt. Orders in epic.

## 2018-04-22 ENCOUNTER — Encounter (HOSPITAL_COMMUNITY): Payer: Medicare Other

## 2018-04-23 ENCOUNTER — Encounter: Payer: Self-pay | Admitting: Physician Assistant

## 2018-04-23 ENCOUNTER — Ambulatory Visit (INDEPENDENT_AMBULATORY_CARE_PROVIDER_SITE_OTHER): Payer: Medicare Other | Admitting: Physician Assistant

## 2018-04-23 ENCOUNTER — Other Ambulatory Visit (INDEPENDENT_AMBULATORY_CARE_PROVIDER_SITE_OTHER): Payer: Medicare Other

## 2018-04-23 VITALS — BP 142/58 | HR 71 | Ht 62.0 in | Wt 111.0 lb

## 2018-04-23 DIAGNOSIS — K2980 Duodenitis without bleeding: Secondary | ICD-10-CM

## 2018-04-23 DIAGNOSIS — D509 Iron deficiency anemia, unspecified: Secondary | ICD-10-CM | POA: Diagnosis not present

## 2018-04-23 DIAGNOSIS — K31819 Angiodysplasia of stomach and duodenum without bleeding: Secondary | ICD-10-CM | POA: Diagnosis not present

## 2018-04-23 LAB — CBC WITH DIFFERENTIAL/PLATELET
BASOS ABS: 0.1 10*3/uL (ref 0.0–0.1)
Basophils Relative: 2.2 % (ref 0.0–3.0)
EOS PCT: 2.6 % (ref 0.0–5.0)
Eosinophils Absolute: 0.1 10*3/uL (ref 0.0–0.7)
HEMATOCRIT: 32.3 % — AB (ref 36.0–46.0)
HEMOGLOBIN: 10.7 g/dL — AB (ref 12.0–15.0)
Lymphocytes Relative: 24 % (ref 12.0–46.0)
Lymphs Abs: 0.9 10*3/uL (ref 0.7–4.0)
MCHC: 33 g/dL (ref 30.0–36.0)
MCV: 85.7 fl (ref 78.0–100.0)
MONOS PCT: 13.5 % — AB (ref 3.0–12.0)
Monocytes Absolute: 0.5 10*3/uL (ref 0.1–1.0)
Neutro Abs: 2.2 10*3/uL (ref 1.4–7.7)
Neutrophils Relative %: 57.7 % (ref 43.0–77.0)
Platelets: 256 10*3/uL (ref 150.0–400.0)
RBC: 3.77 Mil/uL — AB (ref 3.87–5.11)
RDW: 25.7 % — ABNORMAL HIGH (ref 11.5–15.5)
WBC: 3.8 10*3/uL — AB (ref 4.0–10.5)

## 2018-04-23 MED ORDER — PANTOPRAZOLE SODIUM 40 MG PO TBEC: 40.0000 mg | DELAYED_RELEASE_TABLET | Freq: Every day | ORAL | 11 refills | Status: DC

## 2018-04-23 NOTE — Patient Instructions (Addendum)
Your provider has requested that you go to the basement level for lab work before leaving today. Press "B" on the elevator. The lab is located at the first door on the left as you exit the elevator.  We sent refills for the Pantoprazole sodium 40 mg to your pharmacy. [facility], [place].  The nurse will call you tomorrow about an IV Iron infusion at [facility].  Normal BMI (Body Mass Index- based on height and weight) is between 23 and 30. Your BMI today is Body mass index is 20.3 kg/m. [name] Please consider follow up  regarding your BMI with your Primary Care Provider.

## 2018-04-24 ENCOUNTER — Encounter: Payer: Self-pay | Admitting: Physician Assistant

## 2018-04-24 NOTE — Progress Notes (Addendum)
Subjective:    Patient ID: Crystal Cooper, female    DOB: [birth year], 84 y.o.   MRN: [id number]  HPI Crystal Cooper is a pleasant 84 year old white female, recently known to Dr. Hilarie Cooper seen during hospitalization.  She had remotely been established with Dr. Gala Cooper, and apparently had colonoscopy in 2007. She comes in today for post hospital follow-up, after admission 10/13 through 04/08/2018 with weakness and shortness of breath, and found to have marked anemia with hemoglobin of 5.4 very She was heme positive with complaints of dark stool in setting of Eliquis.  She received transfusions during her admission and 1 Feraheme infusion.  Iron studies consistent with iron deficiency. Went EGD with Dr. 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  She was to stay off of Eliquis at term after her admission, and was discharged home on daily PPI.  She did have follow-up hemoglobin through her PCP on 04/15/2018 showed hemoglobin of 8.7 hematocrit of 27.4. She says she has been feeling pretty good, denies any extreme weakness and has not had any shortness of breath with usual activities.  She denies dizziness or lightheadedness.  She has been paying attention to her bowels and has not noted any dark stools or blood. Apparently she had been scheduled for another iron infusion but they were clear whether or not she needed this and decided to wait until the appointment today.  Other medical problems include hypertension, atrial fibrillation, aortic stenosis, congestive heart failure.  Review of Systems Pertinent positive and negative review of systems were noted in the above HPI section.  All other review of systems was otherwise negative.  Outpatient Encounter Medications as of 04/23/2018  Medication Sig  . acetaminophen (TYLENOL) 500 MG tablet Take 500 mg by mouth every 6 (six) hours as needed for mild pain.  [name] albuterol (PROVENTIL HFA;VENTOLIN HFA) 108 (90 Base) MCG/ACT inhaler  Inhale 2 puffs into the lungs every 4 (four) hours as needed for wheezing or shortness of breath.  [name] amLODipine (NORVASC) 2.5 MG tablet Take 1 tablet (2.5 mg total) by mouth daily.  . Biotin 10 MG CAPS Take 1 capsule by mouth daily.  . calcium-vitamin D (OSCAL WITH D) 500-200 MG-UNIT tablet Take 1 tablet by mouth daily.  [name] denosumab (PROLIA) 60 MG/ML SOSY injection Inject 60 mg into the skin every 6 (six) months.  . docusate sodium (COLACE) 100 MG capsule Take 100 mg by mouth daily as needed for mild constipation.  . ferrous sulfate 325 (65 FE) MG tablet Take 1 tablet (325 mg total) by mouth 2 (two) times daily with a meal.  . furosemide (LASIX) 40 MG tablet TAKE 1 & 1/2 (ONE & ONE-HALF) TABLETS BY MOUTH ONCE DAILY  . multivitamin-iron-minerals-folic acid (CENTRUM) chewable tablet Chew 1 tablet by mouth daily.  . pantoprazole (PROTONIX) 40 MG tablet Take 1 tablet (40 mg total) by mouth daily at 6 (six) AM.  . [DISCONTINUED] pantoprazole (PROTONIX) 40 MG tablet Take 1 tablet (40 mg total) by mouth daily at 6 (six) AM.   No facility-administered encounter medications on file as of 04/23/2018.    Allergies  Allergen Reactions  . Hctz [Hydrochlorothiazide] Other (See Comments)    Pt was ill and affected kidneys   . Codeine Other (See Comments)    Unknown  Made pt feel ill, has not had any problems since 1977   Patient Active Problem List   Diagnosis Date Noted  . Iron deficiency anemia   .  Melena   . Angiodysplasia of duodenum   . Gastritis and gastroduodenitis   . Symptomatic anemia 04/06/2018  . Osteoporosis 08/27/2016  . ASCVD (arteriosclerotic cardiovascular disease) 01/11/2016  . Asthma 01/11/2016  . Glaucoma 01/11/2016  . Hearing loss 01/11/2016  . Other nonrheumatic mitral valve disorders 01/11/2016  . Junctional bradycardia   . Aortic valve stenosis 10/25/2015  . Atrial fibrillation (Crystal Cooper) 09/06/2015  . Hyponatremia 12/17/2014  . Atrial flutter (Crystal Cooper) 12/14/2014  .  Congestive heart failure (Crystal Cooper) 12/12/2014  . Aortic stenosis 11/13/2013  . Murmur 10/19/2011  . Chest pain 10/19/2011  . Hypertension 10/19/2011  . SVT (supraventricular tachycardia) (Crystal Cooper) 10/19/2011   Social History   Socioeconomic History  . Marital status: Widowed    Spouse name: Not on file  . Number of children: 3  . Years of education: Not on file  . Highest education level: Not on file  Occupational History  . Not on file  Social Needs  . Financial resource strain: Not on file  . Food insecurity:    Worry: Not on file    Inability: Not on file  . Transportation needs:    Medical: Not on file    Non-medical: Not on file  Tobacco Use  . Smoking status: Never Smoker  . Smokeless tobacco: Never Used  Substance and Sexual Activity  . Alcohol use: No    Alcohol/week: 0.0 standard drinks  . Drug use: No  . Sexual activity: Never  Lifestyle  . Physical activity:    Days per week: Not on file    Minutes per session: Not on file  . Stress: Not on file  Relationships  . Social connections:    Talks on phone: Not on file    Gets together: Not on file    Attends religious service: Not on file    Active member of club or organization: Not on file    Attends meetings of clubs or organizations: Not on file    Relationship status: Not on file  . Intimate partner violence:    Fear of current or ex partner: Not on file    Emotionally abused: Not on file    Physically abused: Not on file    Forced sexual activity: Not on file  Other Topics Concern  . Not on file  Social History Narrative  . Not on file    [name]'s family history includes Hypertension in her mother; Pneumonia in her father.      Objective:    Vitals:   04/23/18 1512  BP: (!) 142/58  Pulse: 71    Physical Exam; well-developed elderly, thin, frail appearing white female in no acute distress, was not accompanied by her daughter blood pressure 142/58 pulse 71, BMI 20.3.  HEENT; nontraumatic  normocephalic EOMI PERRLA sclera anicteric Oral mucosa moist, Cardiovascular; regular rate and rhythm with H2-C9,, systolic murmur Pulmonary; clear bilaterally, Abdomen ;soft, nontender non-distended bowel sounds are active there is no palpable mass or hepatosplenomegaly, Rectal ;exam not done today she was recently documented heme positive during hospitalization, Extremities; no clubbing cyanosis or edema skin warm and dry, Neuro psych ;alert and oriented, grossly nonfocal mood and affect appropriate       Assessment & Plan:   #1 84 year old white female with recent hospitalization 2 weeks ago with weakness, shortness of breath and marked anemia with hemoglobin of 5.4, heme positive stool, there deficiency, in setting of chronic anticoagulation with Eliquis.  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 She is been doing well since return to home with no active GI complaints She has not noted any melena or hematochezia. She remains off Eliquis. Last hemoglobin had drifted about half a gram.  Suspect that she has AVMs as source for chronic slow intermittent blood loss especially in setting of chronic anticoagulation.  She likely has other small bowel AVMs.  Colonoscopy was last done in 2007 and normal.  This was not pursued during hospitalization.  #2 history of atrial fibrillation-on chronic Eliquis #3.  Congestive heart failure #4.  Aortic stenosis #5.  Hypertension  Plan; repeat CBC today, and will need serial CBCs over the next several months Schedule patient for a second IV Feraheme infusion. Continue Protonix 40 mg p.o. daily.-Refill sent We will monitor her hemoglobin serially.  If she has had any significant drop over the past couple of weeks, we will leave her off of Eliquis and then plan to proceed with colonoscopy and/or capsule endoscopy. If she is maintaining a stable hemoglobin will restart Eliquis, and then continue to monitor hemoglobin.   This plan was discussed with the patient and her daughter expressed understanding.   Joie Hipps S Marionette Meskill PA-C 04/24/2018   Cc: Eustaquio Maize, MD   Addendum: Reviewed and agree with assessment and management plan. Pyrtle, Lajuan Lines, MD

## 2018-04-25 ENCOUNTER — Other Ambulatory Visit: Payer: Self-pay

## 2018-04-25 DIAGNOSIS — D509 Iron deficiency anemia, unspecified: Secondary | ICD-10-CM

## 2018-04-28 ENCOUNTER — Encounter (HOSPITAL_COMMUNITY)
Admission: RE | Admit: 2018-04-28 | Discharge: 2018-04-28 | Disposition: A | Discharge status: Home / Self Care | Payer: Medicare Other | Source: Ambulatory Visit | Attending: Internal Medicine | Admitting: Internal Medicine

## 2018-04-28 ENCOUNTER — Encounter (HOSPITAL_COMMUNITY): Payer: Self-pay

## 2018-04-28 DIAGNOSIS — D509 Iron deficiency anemia, unspecified: Secondary | ICD-10-CM | POA: Diagnosis not present

## 2018-04-28 MED ORDER — SODIUM CHLORIDE 0.9 % IV SOLN
Freq: Once | INTRAVENOUS | Status: AC
  Administered 2018-04-28: 15:00:00 via INTRAVENOUS

## 2018-04-28 MED ORDER — SODIUM CHLORIDE 0.9 % IV SOLN
510.0000 mg | Freq: Once | INTRAVENOUS | Status: AC
  Administered 2018-04-28: 510 mg via INTRAVENOUS
  Filled 2018-04-28: qty 17

## 2018-04-28 NOTE — Progress Notes (Signed)
Tolerated infusion well.  Has had iron infusions in the past without reaction noted.

## 2018-05-08 ENCOUNTER — Telehealth: Payer: Self-pay | Admitting: Physician Assistant

## 2018-05-08 ENCOUNTER — Other Ambulatory Visit: Payer: Medicare Other

## 2018-05-09 ENCOUNTER — Other Ambulatory Visit: Payer: Self-pay

## 2018-05-09 ENCOUNTER — Other Ambulatory Visit: Payer: Medicare Other

## 2018-05-09 DIAGNOSIS — D509 Iron deficiency anemia, unspecified: Secondary | ICD-10-CM | POA: Diagnosis not present

## 2018-05-09 NOTE — Telephone Encounter (Signed)
Order is in [place] now. Faxed to the number listed as requested.  CBC for chronic anemia

## 2018-05-10 LAB — CBC WITH DIFFERENTIAL/PLATELET
BASOS ABS: 0.1 10*3/uL (ref 0.0–0.2)
Basos: 1 %
EOS (ABSOLUTE): 0.1 10*3/uL (ref 0.0–0.4)
Eos: 3 %
Hematocrit: 30.3 % — ABNORMAL LOW (ref 34.0–46.6)
Hemoglobin: 9.8 g/dL — ABNORMAL LOW (ref 11.1–15.9)
IMMATURE GRANULOCYTES: 1 %
Immature Grans (Abs): 0 10*3/uL (ref 0.0–0.1)
LYMPHS: 28 %
Lymphocytes Absolute: 1.2 10*3/uL (ref 0.7–3.1)
MCH: 28.9 pg (ref 26.6–33.0)
MCHC: 32.3 g/dL (ref 31.5–35.7)
MCV: 89 fL (ref 79–97)
MONOS ABS: 0.5 10*3/uL (ref 0.1–0.9)
Monocytes: 12 %
NEUTROS PCT: 55 %
Neutrophils Absolute: 2.3 10*3/uL (ref 1.4–7.0)
PLATELETS: 202 10*3/uL (ref 150–450)
RBC: 3.39 x10E6/uL — AB (ref 3.77–5.28)
RDW: 20.5 % — AB (ref 12.3–15.4)
WBC: 4.2 10*3/uL (ref 3.4–10.8)

## 2018-05-10 LAB — SPECIMEN STATUS REPORT

## 2018-05-12 ENCOUNTER — Encounter: Payer: Self-pay | Admitting: Cardiology

## 2018-05-12 DIAGNOSIS — K922 Gastrointestinal hemorrhage, unspecified: Secondary | ICD-10-CM | POA: Insufficient documentation

## 2018-05-12 DIAGNOSIS — Z952 Presence of prosthetic heart valve: Secondary | ICD-10-CM | POA: Insufficient documentation

## 2018-05-12 DIAGNOSIS — I5189 Other ill-defined heart diseases: Secondary | ICD-10-CM | POA: Insufficient documentation

## 2018-05-12 DIAGNOSIS — Z950 Presence of cardiac pacemaker: Secondary | ICD-10-CM | POA: Insufficient documentation

## 2018-05-13 ENCOUNTER — Encounter: Payer: Self-pay | Admitting: Cardiology

## 2018-05-13 ENCOUNTER — Telehealth: Payer: Self-pay | Admitting: Cardiology

## 2018-05-13 ENCOUNTER — Ambulatory Visit (INDEPENDENT_AMBULATORY_CARE_PROVIDER_SITE_OTHER): Payer: Medicare Other | Admitting: Cardiology

## 2018-05-13 DIAGNOSIS — I5189 Other ill-defined heart diseases: Secondary | ICD-10-CM

## 2018-05-13 DIAGNOSIS — I480 Paroxysmal atrial fibrillation: Secondary | ICD-10-CM

## 2018-05-13 DIAGNOSIS — Z950 Presence of cardiac pacemaker: Secondary | ICD-10-CM

## 2018-05-13 DIAGNOSIS — K264 Chronic or unspecified duodenal ulcer with hemorrhage: Secondary | ICD-10-CM | POA: Diagnosis not present

## 2018-05-13 DIAGNOSIS — Z952 Presence of prosthetic heart valve: Secondary | ICD-10-CM | POA: Diagnosis not present

## 2018-05-13 DIAGNOSIS — I10 Essential (primary) hypertension: Secondary | ICD-10-CM | POA: Diagnosis not present

## 2018-05-13 NOTE — Assessment & Plan Note (Signed)
TAVR May 2017- Her most recent echo Oct 2019 showed normal LVF with transvalvularvelocity was within the normal range. There was no stenosis. There was no regurgitation.

## 2018-05-13 NOTE — Assessment & Plan Note (Signed)
NSR Oct 2019

## 2018-05-13 NOTE — Assessment & Plan Note (Signed)
Admitted 04/06/18- Eliquis stopped. Transfused. She resumed Eliquis 04/23/18 - then stopped it again 05/09/18 secondary to a drop in her Hgb to 9.8 from 10.7.

## 2018-05-13 NOTE — Assessment & Plan Note (Signed)
Continue present blood pressure medications. 

## 2018-05-13 NOTE — Progress Notes (Signed)
05/13/2018 Crystal Cooper   [birth year]  [id number]  Primary Physician Eustaquio Maize, MD Primary Cardiologist: Dr Stanford Breed  HPI: Mrs. Dowse is a pleasant 84 year old female followed by Dr. Stanford Breed.  She is here in the office today for post hospital follow-up with her daughter.  Patient has a history of remote AVNRT ablation in 2003.  She has severe aortic stenosis and in May 2017 she underwent TAVR.  She had a pacemaker placed at that time as well for bradycardia.  She also had paroxysmal atrial fibrillation at that time and underwent a cardioversion in September 2017.  She is been on Eliquis.  She presented to the emergency room 04/06/2018 with weakness.  She was found to be profoundly anemic with a hemoglobin of 5.4.  Her Eliquis was held and she was transfused.  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  She was seen in in the GI office 04/23/2018.  There was some confusion about whether she was to resume her Eliquis or not.  The notes seem to indicate that she should continue to hold the Eliquis and they would monitor her CBC but the patient's daughter says that her instructions were to resume the Eliquis.  At the time of that office visit (04/23/18)  the patient's hemoglobin was 10.7.  A follow-up hemoglobin done on November 15 showed a drop to 9.8.  The patient was contacted at that point and told "continue to hold Eliquis".  The patient's daughter has sent a patient called message through epic to try and get this clarified.  From a cardiac standpoint she is doing well.  She denies any unusual palpitations or tachycardia.  She has no chest pain or shortness of breath.  She is hard of hearing and some of this history was obtained through the patient's daughter.   Current Outpatient Medications  Medication Sig Dispense Refill  . acetaminophen (TYLENOL) 500 MG tablet Take 500 mg by mouth every 6 (six) hours as needed for mild pain.    [name] albuterol (PROVENTIL HFA;VENTOLIN HFA) 108 (90  Base) MCG/ACT inhaler Inhale 2 puffs into the lungs every 4 (four) hours as needed for wheezing or shortness of breath. 18 g 3  . amLODipine (NORVASC) 2.5 MG tablet Take 1 tablet (2.5 mg total) by mouth daily. 90 tablet 1  . Biotin 10 MG CAPS Take 1 capsule by mouth daily.    . calcium-vitamin D (OSCAL WITH D) 500-200 MG-UNIT tablet Take 1 tablet by mouth daily.    [name] denosumab (PROLIA) 60 MG/ML SOSY injection Inject 60 mg into the skin every 6 (six) months.    . docusate sodium (COLACE) 100 MG capsule Take 100 mg by mouth daily as needed for mild constipation.    . ferrous sulfate 325 (65 FE) MG tablet Take 1 tablet (325 mg total) by mouth 2 (two) times daily with a meal.  3  . furosemide (LASIX) 40 MG tablet TAKE 1 & 1/2 (ONE & ONE-HALF) TABLETS BY MOUTH ONCE DAILY 45 tablet 3  . multivitamin-iron-minerals-folic acid (CENTRUM) chewable tablet Chew 1 tablet by mouth daily.    . pantoprazole (PROTONIX) 40 MG tablet Take 1 tablet (40 mg total) by mouth daily at 6 (six) AM. 30 tablet 11   No current facility-administered medications for this visit.     Allergies  Allergen Reactions  . Hctz [Hydrochlorothiazide] Other (See Comments)    Pt was ill and affected kidneys   . Codeine Other (See Comments)    Unknown  Made pt feel ill, has not had any problems since 1977    Past Medical History:  Diagnosis Date  . Anemia    years ago  . Aortic stenosis   . Arthritis   . Asthma   . Atrial fibrillation ([place])   . CHF (congestive heart failure) ([place]) 11/2014  . Family history of adverse reaction to anesthesia    2 daughters would have N/V  . Glaucoma   . Hearing loss   . Heart murmur   . HTN (hypertension)   . Pneumonia   . Prolapsing mitral leaflet syndrome   . Shortness of breath dyspnea    with exertion  . SVT (supraventricular tachycardia) ([facility])    S/P ablation of AVNRT in 2003    Social History   Socioeconomic History  . Marital status: Widowed    Spouse name: Not on file  .  Number of children: 3  . Years of education: Not on file  . Highest education level: Not on file  Occupational History  . Not on file  Social Needs  . Financial resource strain: Not on file  . Food insecurity:    Worry: Not on file    Inability: Not on file  . Transportation needs:    Medical: Not on file    Non-medical: Not on file  Tobacco Use  . Smoking status: Never Smoker  . Smokeless tobacco: Never Used  Substance and Sexual Activity  . Alcohol use: No    Alcohol/week: 0.0 standard drinks  . Drug use: No  . Sexual activity: Never  Lifestyle  . Physical activity:    Days per week: Not on file    Minutes per session: Not on file  . Stress: Not on file  Relationships  . Social connections:    Talks on phone: Not on file    Gets together: Not on file    Attends religious service: Not on file    Active member of club or organization: Not on file    Attends meetings of clubs or organizations: Not on file    Relationship status: Not on file  . Intimate partner violence:    Fear of current or ex partner: Not on file    Emotionally abused: Not on file    Physically abused: Not on file    Forced sexual activity: Not on file  Other Topics Concern  . Not on file  Social History Narrative  . Not on file     Family History  Problem Relation Age of Onset  . Hypertension Mother   . Pneumonia Father   . Heart attack Neg Hx   . Colon cancer Neg Hx   . Esophageal cancer Neg Hx      Review of Systems: General: negative for chills, fever, night sweats or weight changes.  Cardiovascular: negative for chest pain, dyspnea on exertion, edema, orthopnea, palpitations, paroxysmal nocturnal dyspnea or shortness of breath Dermatological: negative for rash Respiratory: negative for cough or wheezing Urologic: negative for hematuria Abdominal: negative for nausea, vomiting, diarrhea, bright red blood per rectum, melena, or hematemesis Neurologic: negative for visual changes,  syncope, or dizziness All other systems reviewed and are otherwise negative except as noted above.    Blood pressure (!) 147/68, pulse 80, resp. rate 16, height 5\' 2"  (1.575 m), weight 112 lb (50.8 kg), SpO2 97 %.  General appearance: alert, cooperative, no distress and thin Lungs: clear to auscultation bilaterally Heart: regular rate and rhythm Extremities: no edema Skin:  pale, warm, dry Neurologic: Grossly normal   ASSESSMENT AND PLAN:   GI bleed Admitted 04/06/18- Eliquis stopped. Transfused. She resumed Eliquis 04/23/18 - then stopped it again 05/09/18 secondary to a drop in her Hgb to 9.8 from 10.7.  PAF (paroxysmal atrial fibrillation) [facility]) NSR Oct 2019  S/P TAVR (transcatheter aortic valve replacement) TAVR May 2017- Her most recent echo Oct 2019 showed normal LVF with transvalvularvelocity was within the normal range. There was no stenosis. There was no regurgitation.  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 Dr Curt Bears follows.   Essential hypertension Continue present blood pressure medications.  Diastolic dysfunction Echo Oct 2019- grade 2 DD   PLAN  Will defer Eliquis question to GI. I'll ask Dr Stanford Breed if he thinks we should consider ASA 81 mg if the patient is unable to take Eliquis.   Kerin Ransom PA-C 05/13/2018 3:00 PM

## 2018-05-13 NOTE — Patient Instructions (Signed)
Medication Instructions:  No Changes. If you need a refill on your cardiac medications before your next appointment, please call your pharmacy.   Lab work: No Orders. If you have labs (blood work) drawn today and your tests are completely normal, you will receive your results only by: [name] MyChart Message (if you have MyChart) OR . A paper copy in the mail If you have any lab test that is abnormal or we need to change your treatment, we will call you to review the results.  Testing/Procedures: No Orders.  Follow-Up: At [facility], you and your health needs are our priority.  As part of our continuing mission to provide you with exceptional heart care, we have created designated Provider Care Teams.  These Care Teams include your primary Cardiologist (physician) and Advanced Practice Providers (APPs -  Physician Assistants and Nurse Practitioners) who all work together to provide you with the care you need, when you need it. You will need a follow up appointment in 3 months. You may see Dr.Crenshaw or one of the following Advanced Practice Providers on your designated Care Team:   Kerin Ransom, PA-C Roby Lofts, [place] . Sande Rives, PA-C  Any Other Special Instructions Will Be Listed Below (If Applicable). None

## 2018-05-13 NOTE — Assessment & Plan Note (Signed)
Echo Oct 2019- grade 2 DD

## 2018-05-13 NOTE — Assessment & Plan Note (Signed)
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 Dr Curt Bears follows.

## 2018-05-14 ENCOUNTER — Ambulatory Visit (INDEPENDENT_AMBULATORY_CARE_PROVIDER_SITE_OTHER): Payer: Medicare Other

## 2018-05-14 DIAGNOSIS — R001 Bradycardia, unspecified: Secondary | ICD-10-CM

## 2018-05-14 NOTE — Telephone Encounter (Signed)
error 

## 2018-05-14 NOTE — Telephone Encounter (Signed)
Please clarify if the patient is or is currently off of Eliquis therapy Her hemoglobin has decreased slightly in the last 3 weeks.  If she is back on Eliquis this could be contributing to slow GI blood loss. If she is back on Eliquis and asymptomatic then I would continue with current therapy and repeat CBC in 1 week as previously recommended

## 2018-05-15 NOTE — Progress Notes (Signed)
Remote pacemaker transmission.   

## 2018-05-21 ENCOUNTER — Other Ambulatory Visit: Payer: Self-pay

## 2018-05-21 ENCOUNTER — Telehealth: Payer: Self-pay | Admitting: Internal Medicine

## 2018-05-21 DIAGNOSIS — D649 Anemia, unspecified: Secondary | ICD-10-CM

## 2018-05-21 NOTE — Telephone Encounter (Signed)
Order in epic. 

## 2018-05-21 NOTE — Telephone Encounter (Signed)
Daughter aware that order is in epic.

## 2018-05-21 NOTE — Telephone Encounter (Signed)
Pt's daughter called again to give fax # for Keen[facility] Bachelor to fax lab order [phone].

## 2018-05-26 ENCOUNTER — Other Ambulatory Visit: Payer: Self-pay

## 2018-05-26 ENCOUNTER — Other Ambulatory Visit: Payer: Medicare Other

## 2018-05-26 ENCOUNTER — Telehealth: Payer: Self-pay | Admitting: Internal Medicine

## 2018-05-26 DIAGNOSIS — D649 Anemia, unspecified: Secondary | ICD-10-CM

## 2018-05-26 NOTE — Telephone Encounter (Signed)
The phone number does not ring through.  [facility] does not have a Crystal Cooper and does not know who the patient is. Unable to help "Crystal Cooper"

## 2018-05-27 ENCOUNTER — Other Ambulatory Visit: Payer: Self-pay

## 2018-05-27 DIAGNOSIS — D509 Iron deficiency anemia, unspecified: Secondary | ICD-10-CM

## 2018-05-27 DIAGNOSIS — D649 Anemia, unspecified: Secondary | ICD-10-CM

## 2018-05-27 LAB — CBC WITH DIFFERENTIAL/PLATELET
BASOS: 1 %
Basophils Absolute: 0.1 10*3/uL (ref 0.0–0.2)
EOS (ABSOLUTE): 0.2 10*3/uL (ref 0.0–0.4)
EOS: 5 %
HEMOGLOBIN: 9.5 g/dL — AB (ref 11.1–15.9)
Hematocrit: 29.5 % — ABNORMAL LOW (ref 34.0–46.6)
IMMATURE GRANS (ABS): 0 10*3/uL (ref 0.0–0.1)
IMMATURE GRANULOCYTES: 0 %
LYMPHS: 27 %
Lymphocytes Absolute: 1.3 10*3/uL (ref 0.7–3.1)
MCH: 29.4 pg (ref 26.6–33.0)
MCHC: 32.2 g/dL (ref 31.5–35.7)
MCV: 91 fL (ref 79–97)
MONOCYTES: 11 %
Monocytes Absolute: 0.5 10*3/uL (ref 0.1–0.9)
NEUTROS ABS: 2.8 10*3/uL (ref 1.4–7.0)
NEUTROS PCT: 56 %
Platelets: 189 10*3/uL (ref 150–450)
RBC: 3.23 x10E6/uL — ABNORMAL LOW (ref 3.77–5.28)
RDW: 18.3 % — ABNORMAL HIGH (ref 12.3–15.4)
WBC: 4.9 10*3/uL (ref 3.4–10.8)

## 2018-06-06 ENCOUNTER — Telehealth: Payer: Self-pay | Admitting: Internal Medicine

## 2018-06-06 NOTE — Telephone Encounter (Signed)
Patient daughter states pt is having labs done Monday 12.16.19 and wants to make sure the form for her cbc is sent to [place]. Patient daughter requesting a call to make sure this is done.

## 2018-06-06 NOTE — Telephone Encounter (Signed)
Left message for pts daughter that the order is in epic so her PCP should be able to see the order.

## 2018-06-09 ENCOUNTER — Other Ambulatory Visit: Payer: Medicare Other

## 2018-06-09 DIAGNOSIS — D649 Anemia, unspecified: Secondary | ICD-10-CM | POA: Diagnosis not present

## 2018-06-09 DIAGNOSIS — D509 Iron deficiency anemia, unspecified: Secondary | ICD-10-CM | POA: Diagnosis not present

## 2018-06-09 LAB — CBC WITH DIFFERENTIAL/PLATELET
BASOS ABS: 0.1 10*3/uL (ref 0.0–0.2)
Basos: 2 %
EOS (ABSOLUTE): 0.2 10*3/uL (ref 0.0–0.4)
Eos: 6 %
Hematocrit: 33.8 % — ABNORMAL LOW (ref 34.0–46.6)
Hemoglobin: 11.3 g/dL (ref 11.1–15.9)
IMMATURE GRANS (ABS): 0 10*3/uL (ref 0.0–0.1)
Immature Granulocytes: 0 %
LYMPHS: 29 %
Lymphocytes Absolute: 1.1 10*3/uL (ref 0.7–3.1)
MCH: 29.8 pg (ref 26.6–33.0)
MCHC: 33.4 g/dL (ref 31.5–35.7)
MCV: 89 fL (ref 79–97)
Monocytes Absolute: 0.7 10*3/uL (ref 0.1–0.9)
Monocytes: 18 %
NEUTROS ABS: 1.8 10*3/uL (ref 1.4–7.0)
NEUTROS PCT: 45 %
PLATELETS: 211 10*3/uL (ref 150–450)
RBC: 3.79 x10E6/uL (ref 3.77–5.28)
RDW: 14.9 % (ref 12.3–15.4)
WBC: 3.9 10*3/uL (ref 3.4–10.8)

## 2018-06-16 ENCOUNTER — Encounter: Payer: Self-pay | Admitting: Pediatrics

## 2018-06-16 ENCOUNTER — Ambulatory Visit (INDEPENDENT_AMBULATORY_CARE_PROVIDER_SITE_OTHER): Payer: Medicare Other | Admitting: Pediatrics

## 2018-06-16 VITALS — BP 136/64 | HR 77 | Temp 97.1°F | Ht 62.0 in | Wt 110.6 lb

## 2018-06-16 DIAGNOSIS — I10 Essential (primary) hypertension: Secondary | ICD-10-CM | POA: Diagnosis not present

## 2018-06-16 DIAGNOSIS — I480 Paroxysmal atrial fibrillation: Secondary | ICD-10-CM | POA: Diagnosis not present

## 2018-06-16 DIAGNOSIS — D509 Iron deficiency anemia, unspecified: Secondary | ICD-10-CM | POA: Diagnosis not present

## 2018-06-16 DIAGNOSIS — M810 Age-related osteoporosis without current pathological fracture: Secondary | ICD-10-CM

## 2018-06-16 NOTE — Progress Notes (Signed)
  Subjective:   Patient ID: Crystal Cooper, female    DOB: August 24, 1926, 84 y.o.   MRN: [id number] CC: Medical Management of Chronic Issues  HPI: Crystal Cooper is a 84 y.o. female   Has not noticed any further bleeding.  Had recheck hemoglobin last week, up to 10.7.  Has follow-up hemoglobin in 4 weeks.  Still holding Eliquis, on for atrial fibrillation.  Has follow-up upcoming with cardiology.    Overall has been feeling well.  No shortness of breath.  Appetite is been okay.  Having regular bowel movements.  Taking medicines regularly, no lightheadedness or dizziness.  Osteoporosis: Due for Prolia injection February 2020  Relevant past medical, surgical, family and social history reviewed. Allergies and medications reviewed and updated. Social History   Tobacco Use  Smoking Status Never Smoker  Smokeless Tobacco Never Used   ROS: Per HPI   Objective:    BP 136/64   Pulse 77   Temp (!) 97.1 F (36.2 C) (Oral)   Ht 5\' 2"  (1.575 m)   Wt 110 lb 9.6 oz (50.2 kg)   BMI 20.23 kg/m   Wt Readings from Last 3 Encounters:  06/16/18 110 lb 9.6 oz (50.2 kg)  05/13/18 112 lb (50.8 kg)  04/23/18 111 lb (50.3 kg)    Gen: NAD, alert, cooperative with exam, NCAT EYES: EOMI, no conjunctival injection, or no icterus ENT: OP without erythema LYMPH: no cervical LAD CV: NRRR, normal S1/S2, no murmur, distal pulses 2+ b/l Resp: CTABL, no wheezes, normal WOB Abd: +BS, soft, NTND. no guarding or organomegaly Ext: No edema, warm Neuro: Alert and oriented  Assessment & Plan:  Crystal Cooper was seen today for medical management of chronic issues.  Diagnoses and all orders for this visit:  Essential hypertension Stable, continue to new current medicine  Iron deficiency anemia, unspecified iron deficiency anemia type Improved, on iron replacement.  Has next hemoglobin in 4 weeks.  May be able to restart Eliquis in future  PAF (paroxysmal atrial fibrillation) [facility]) Has follow-up with cardiology  upcoming  Osteoporosis, unspecified osteoporosis type, unspecified pathological fracture presence Due for Prolia Feb 2020  Follow up plan: Return in about 2 months (around 08/17/2018) for prolia injection. Assunta Found, MD [place]

## 2018-06-23 ENCOUNTER — Other Ambulatory Visit: Payer: Self-pay | Admitting: Pediatrics

## 2018-06-23 DIAGNOSIS — I5032 Chronic diastolic (congestive) heart failure: Secondary | ICD-10-CM

## 2018-07-07 ENCOUNTER — Other Ambulatory Visit: Payer: Self-pay | Admitting: Internal Medicine

## 2018-07-07 ENCOUNTER — Telehealth: Payer: Self-pay | Admitting: Internal Medicine

## 2018-07-07 ENCOUNTER — Other Ambulatory Visit: Payer: Medicare Other

## 2018-07-07 ENCOUNTER — Ambulatory Visit: Payer: Medicare Other

## 2018-07-07 DIAGNOSIS — D649 Anemia, unspecified: Secondary | ICD-10-CM | POA: Diagnosis not present

## 2018-07-07 NOTE — Telephone Encounter (Signed)
Pt's daughter [name] called requesting orders for blood work sent to pt's PCP. She states that orders have been faxed over before, pt is planning on going to have blood drawn today at  11:00am.

## 2018-07-07 NOTE — Telephone Encounter (Signed)
Lab order is in Epic and faxed to [place].  Pt daughter aware

## 2018-07-08 LAB — CBC WITH DIFFERENTIAL/PLATELET
BASOS ABS: 0.1 10*3/uL (ref 0.0–0.2)
Basos: 1 %
EOS (ABSOLUTE): 0.3 10*3/uL (ref 0.0–0.4)
EOS: 6 %
HEMATOCRIT: 32.9 % — AB (ref 34.0–46.6)
HEMOGLOBIN: 11.1 g/dL (ref 11.1–15.9)
IMMATURE GRANS (ABS): 0 10*3/uL (ref 0.0–0.1)
IMMATURE GRANULOCYTES: 0 %
LYMPHS: 25 %
Lymphocytes Absolute: 1.2 10*3/uL (ref 0.7–3.1)
MCH: 30.9 pg (ref 26.6–33.0)
MCHC: 33.7 g/dL (ref 31.5–35.7)
MCV: 92 fL (ref 79–97)
Monocytes Absolute: 0.6 10*3/uL (ref 0.1–0.9)
Monocytes: 13 %
NEUTROS PCT: 55 %
Neutrophils Absolute: 2.5 10*3/uL (ref 1.4–7.0)
Platelets: 193 10*3/uL (ref 150–450)
RBC: 3.59 x10E6/uL — AB (ref 3.77–5.28)
RDW: 12.9 % (ref 11.7–15.4)
WBC: 4.7 10*3/uL (ref 3.4–10.8)

## 2018-07-10 ENCOUNTER — Telehealth: Payer: Self-pay | Admitting: Physician Assistant

## 2018-07-10 NOTE — Telephone Encounter (Signed)
Labs were placed in Dr Vena Rua outbox as unremarkable. I have returned to [facility] and she will respond to patient.

## 2018-07-10 NOTE — Telephone Encounter (Signed)
Pt called to follow up on message that she sent through my chart. She stated that she is getting very anxious. Pls call her.

## 2018-07-10 NOTE — Telephone Encounter (Signed)
Have you seen any lab faxed over on this patient?

## 2018-07-11 LAB — CUP PACEART REMOTE DEVICE CHECK
Brady Statistic AS VP Percent: 29.93 %
Brady Statistic AS VS Percent: 63.69 %
Brady Statistic RA Percent Paced: 5.85 %
Brady Statistic RV Percent Paced: 33.15 %
Date Time Interrogation Session: 20191120162719
Implantable Lead Implant Date: 20170503
Implantable Lead Location: 753860
Implantable Pulse Generator Implant Date: 20170503
Lead Channel Impedance Value: 304 Ohm
Lead Channel Impedance Value: 342 Ohm
Lead Channel Impedance Value: 418 Ohm
Lead Channel Pacing Threshold Pulse Width: 0.4 ms
Lead Channel Sensing Intrinsic Amplitude: 3.625 mV
Lead Channel Sensing Intrinsic Amplitude: 9.125 mV
Lead Channel Setting Pacing Amplitude: 2 V
Lead Channel Setting Pacing Pulse Width: 0.4 ms
Lead Channel Setting Sensing Sensitivity: 2.8 mV
MDC IDC LEAD IMPLANT DT: 20170503
MDC IDC LEAD LOCATION: 753859
MDC IDC MSMT BATTERY REMAINING LONGEVITY: 96 mo
MDC IDC MSMT BATTERY VOLTAGE: 3.02 V
MDC IDC MSMT LEADCHNL RA IMPEDANCE VALUE: 437 Ohm
MDC IDC MSMT LEADCHNL RA PACING THRESHOLD AMPLITUDE: 1 V
MDC IDC MSMT LEADCHNL RA SENSING INTR AMPL: 3.625 mV
MDC IDC MSMT LEADCHNL RV PACING THRESHOLD AMPLITUDE: 1.25 V
MDC IDC MSMT LEADCHNL RV PACING THRESHOLD PULSEWIDTH: 0.4 ms
MDC IDC MSMT LEADCHNL RV SENSING INTR AMPL: 9.125 mV
MDC IDC SET LEADCHNL RV PACING AMPLITUDE: 2.5 V
MDC IDC STAT BRADY AP VP PERCENT: 3.21 %
MDC IDC STAT BRADY AP VS PERCENT: 3.17 %

## 2018-07-11 NOTE — Telephone Encounter (Signed)
Advised the hemoglobin was WNL's.

## 2018-07-18 ENCOUNTER — Telehealth: Payer: Self-pay | Admitting: Cardiology

## 2018-07-18 DIAGNOSIS — I480 Paroxysmal atrial fibrillation: Secondary | ICD-10-CM

## 2018-07-18 MED ORDER — APIXABAN 5 MG PO TABS: 5.0000 mg | ORAL_TABLET | Freq: Two times a day (BID) | ORAL | 6 refills | Status: DC

## 2018-07-18 NOTE — Telephone Encounter (Signed)
New Message:    Please call, concerning pt's medicine.

## 2018-07-18 NOTE — Telephone Encounter (Signed)
Spoke with pt dtr, they received the following message: Notes recorded by Jerene Bears, MD on 07/11/2018 at 11:23 AM EST Hgb remains normal Great news Consideration to resume Eliquis is reasonable but would defer this to the prescribing provider. If Eliquis is restarted, I recommend CBC every 7 days for the first 2 to 4 weeks on therapy given her history  The patients follow up appointment 08-22-18 and they did not want to wait to restart the xarelto until then if dr Stanford Breed thinks she should restart now. Will forward for dr Stanford Breed review

## 2018-07-18 NOTE — Telephone Encounter (Signed)
Spoke with pt dtr, Aware of dr crenshaw's recommendations. New script sent to the pharmacy and Lab orders mailed to the pt  

## 2018-07-18 NOTE — Telephone Encounter (Signed)
Would treat with apixaban 5 BID; no ASA; check hgb in 1 week and 3 weeks [name]

## 2018-07-25 ENCOUNTER — Other Ambulatory Visit: Payer: Medicare Other

## 2018-07-25 DIAGNOSIS — I480 Paroxysmal atrial fibrillation: Secondary | ICD-10-CM | POA: Diagnosis not present

## 2018-07-26 LAB — CBC
HEMOGLOBIN: 11.2 g/dL (ref 11.1–15.9)
Hematocrit: 32 % — ABNORMAL LOW (ref 34.0–46.6)
MCH: 31.8 pg (ref 26.6–33.0)
MCHC: 35 g/dL (ref 31.5–35.7)
MCV: 91 fL (ref 79–97)
Platelets: 172 10*3/uL (ref 150–450)
RBC: 3.52 x10E6/uL — AB (ref 3.77–5.28)
RDW: 12.2 % (ref 11.7–15.4)
WBC: 5.3 10*3/uL (ref 3.4–10.8)

## 2018-08-04 DIAGNOSIS — H40002 Preglaucoma, unspecified, left eye: Secondary | ICD-10-CM | POA: Diagnosis not present

## 2018-08-04 DIAGNOSIS — H401111 Primary open-angle glaucoma, right eye, mild stage: Secondary | ICD-10-CM | POA: Diagnosis not present

## 2018-08-08 NOTE — Progress Notes (Signed)
HPI: FU AVR and PAF. History of ablation of AVNRT in 2003. Echocardiogram repeated February 2017 and showed normal LV systolic function, severe aortic stenosis with mean gradient 43 mmHg. Cath 2/17 showed no significant CAD. Had TAVR 5/17. Had PM 5/17. Also with afib/flutter. Had DCCV 03/02/16. Echocardiogram October 2019 showed normal LV function, moderate diastolic dysfunction, prior aortic valve replacement with mean gradient 6 mmHg, mild mitral regurgitation, biatrial enlargement, mild tricuspid regurgitation and moderately elevated pulmonary pressure.  Patient admitted with GI bleeding October 2019 with hemoglobin 5.4.  Eliquis was held and she was transfused.  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  Since last seen,she notes some dyspnea on exertion but not as severe as when her hemoglobin was 5.4.  No orthopnea, PND, pedal edema, chest pain or syncope.  She has dark stools related to iron.  Current Outpatient Medications  Medication Sig Dispense Refill  . acetaminophen (TYLENOL) 500 MG tablet Take 500 mg by mouth every 6 (six) hours as needed for mild pain.    [name] albuterol (PROVENTIL HFA;VENTOLIN HFA) 108 (90 Base) MCG/ACT inhaler Inhale 2 puffs into the lungs every 4 (four) hours as needed for wheezing or shortness of breath. 18 g 3  . amLODipine (NORVASC) 2.5 MG tablet Take 1 tablet (2.5 mg total) by mouth daily. 90 tablet 1  . Biotin 10 MG CAPS Take 1 capsule by mouth daily.    . calcium-vitamin D (OSCAL WITH D) 500-200 MG-UNIT tablet Take 1 tablet by mouth daily.    [name] denosumab (PROLIA) 60 MG/ML SOSY injection Inject 60 mg into the skin every 6 (six) months.    . docusate sodium (COLACE) 100 MG capsule Take 100 mg by mouth daily as needed for mild constipation.    . ferrous sulfate 325 (65 FE) MG tablet Take 1 tablet (325 mg total) by mouth 2 (two) times daily with a meal.  3  . furosemide (LASIX) 40 MG tablet TAKE 1 & 1/2 (ONE & ONE-HALF) TABLETS BY MOUTH ONCE DAILY 45  tablet 5  . multivitamin-iron-minerals-folic acid (CENTRUM) chewable tablet Chew 1 tablet by mouth daily.    . pantoprazole (PROTONIX) 40 MG tablet Take 1 tablet (40 mg total) by mouth daily at 6 (six) AM. 30 tablet 11   No current facility-administered medications for this visit.      Past Medical History:  Diagnosis Date  . Anemia    years ago  . Aortic stenosis   . Arthritis   . Asthma   . Atrial fibrillation ([place])   . CHF (congestive heart failure) ([place]) 11/2014  . Family history of adverse reaction to anesthesia    2 daughters would have N/V  . Glaucoma   . Hearing loss   . Heart murmur   . HTN (hypertension)   . Pneumonia   . Prolapsing mitral leaflet syndrome   . Shortness of breath dyspnea    with exertion  . SVT (supraventricular tachycardia) (H[facility])    S/P ablation of AVNRT in 2003    Past Surgical History:  Procedure Laterality Date  . APPENDECTOMY    . BIOPSY  04/07/2018   Procedure: BIOPSY;  Surgeon: Jerene Bears, MD;  Location: [facility] ENDOSCOPY;  Service: Gastroenterology;;  . BLADDER SURGERY    . CARDIAC CATHETERIZATION N/A 09/26/2015   Procedure: Right/Left Heart Cath and Coronary Angiography;  Surgeon: Burnell Blanks, MD;  Location: [place] CV LAB;  Service: Cardiovascular;  Laterality: N/A;  . CARDIAC  SURGERY    . CARDIOVERSION N/A 03/02/2016   Procedure: CARDIOVERSION;  Surgeon: Thayer Headings, MD;  Location: [place];  Service: Cardiovascular;  Laterality: N/A;  . EP IMPLANTABLE DEVICE N/A 10/26/2015   Procedure: Pacemaker Implant;  Surgeon: Will Meredith Leeds, MD;  Location: [place] CV LAB;  Service: Cardiovascular;  Laterality: N/A;  . ESOPHAGOGASTRODUODENOSCOPY (EGD) WITH PROPOFOL N/A 04/07/2018   Procedure: ESOPHAGOGASTRODUODENOSCOPY (EGD) WITH PROPOFOL;  Surgeon: Jerene Bears, MD;  Location: [facility] ENDOSCOPY;  Service: Gastroenterology;  Laterality: N/A;  . EYE SURGERY Bilateral    cataract surgery  . HOT HEMOSTASIS N/A 04/07/2018    Procedure: HOT HEMOSTASIS (ARGON PLASMA COAGULATION/BICAP);  Surgeon: Jerene Bears, MD;  Location: [facility] ENDOSCOPY;  Service: Gastroenterology;  Laterality: N/A;  . NASAL SINUS SURGERY    . TEE WITHOUT CARDIOVERSION N/A 10/25/2015   Procedure: TRANSESOPHAGEAL ECHOCARDIOGRAM (TEE);  Surgeon: Burnell Blanks, MD;  Location: [place];  Service: Open Heart Surgery;  Laterality: N/A;  . TRANSCATHETER AORTIC VALVE REPLACEMENT, TRANSFEMORAL Right 10/25/2015   Procedure: TRANSCATHETER AORTIC VALVE REPLACEMENT, TRANSFEMORAL;  Surgeon: Burnell Blanks, MD;  Location: [place];  Service: Open Heart Surgery;  Laterality: Right;    Social History   Socioeconomic History  . Marital status: Widowed    Spouse name: Not on file  . Number of children: 3  . Years of education: Not on file  . Highest education level: Not on file  Occupational History  . Not on file  Social Needs  . Financial resource strain: Not on file  . Food insecurity:    Worry: Not on file    Inability: Not on file  . Transportation needs:    Medical: Not on file    Non-medical: Not on file  Tobacco Use  . Smoking status: Never Smoker  . Smokeless tobacco: Never Used  Substance and Sexual Activity  . Alcohol use: No    Alcohol/week: 0.0 standard drinks  . Drug use: No  . Sexual activity: Never  Lifestyle  . Physical activity:    Days per week: Not on file    Minutes per session: Not on file  . Stress: Not on file  Relationships  . Social connections:    Talks on phone: Not on file    Gets together: Not on file    Attends religious service: Not on file    Active member of club or organization: Not on file    Attends meetings of clubs or organizations: Not on file    Relationship status: Not on file  . Intimate partner violence:    Fear of current or ex partner: Not on file    Emotionally abused: Not on file    Physically abused: Not on file    Forced sexual activity: Not on file  Other Topics Concern  . Not on  file  Social History Narrative  . Not on file    Family History  Problem Relation Age of Onset  . Hypertension Mother   . Pneumonia Father   . Heart attack Neg Hx   . Colon cancer Neg Hx   . Esophageal cancer Neg Hx     ROS: no fevers or chills, productive cough, hemoptysis, dysphasia, odynophagia, melena, hematochezia, dysuria, hematuria, rash, seizure activity, orthopnea, PND, pedal edema, claudication. Remaining systems are negative.  Physical Exam: Well-developed well-nourished in no acute distress.  Skin is warm and dry.  HEENT is normal.  Neck is supple.  Chest is clear to auscultation with  normal expansion.  Cardiovascular exam is regular rate and rhythm.  Abdominal exam nontender or distended. No masses palpated. Extremities show no edema. neuro grossly intact  ECG-sinus rhythm at a rate of 71, no ST changes.  Personally reviewed  A/P  1 status post TAVR-plan to continue SBE prophylaxis.  2 paroxysmal atrial fibrillation-patient remains in sinus rhythm.  Hemoglobin had decreased to 9.4.  Apixaban is on hold.  I think we should continue this.  She does describe some increased dyspnea on exertion and I wonder if her anemia has worsened.  Check hemoglobin.  She is not volume overloaded on examination.  She understands the higher risk of CVA off of Eliquis but at this point I feel we have no choice.  3 hypertension-patient's blood pressure is controlled.  Continue present medications and follow.  4 chronic diastolic congestive heart failure-she is euvolemic on examination.  Continue present dose of Lasix.  Continue fluid restriction and low-sodium diet.  5 prior pacemaker-patient is followed by electrophysiology.  [name], MD

## 2018-08-13 ENCOUNTER — Encounter: Payer: Self-pay | Admitting: Family Medicine

## 2018-08-13 ENCOUNTER — Ambulatory Visit (INDEPENDENT_AMBULATORY_CARE_PROVIDER_SITE_OTHER): Payer: Medicare Other

## 2018-08-13 ENCOUNTER — Ambulatory Visit (INDEPENDENT_AMBULATORY_CARE_PROVIDER_SITE_OTHER): Payer: Medicare Other | Admitting: Family Medicine

## 2018-08-13 VITALS — BP 130/65 | HR 76 | Temp 97.1°F | Ht 62.0 in | Wt 111.0 lb

## 2018-08-13 DIAGNOSIS — I10 Essential (primary) hypertension: Secondary | ICD-10-CM

## 2018-08-13 DIAGNOSIS — M810 Age-related osteoporosis without current pathological fracture: Secondary | ICD-10-CM | POA: Diagnosis not present

## 2018-08-13 DIAGNOSIS — I480 Paroxysmal atrial fibrillation: Secondary | ICD-10-CM | POA: Diagnosis not present

## 2018-08-13 DIAGNOSIS — R001 Bradycardia, unspecified: Secondary | ICD-10-CM

## 2018-08-13 DIAGNOSIS — D509 Iron deficiency anemia, unspecified: Secondary | ICD-10-CM

## 2018-08-13 DIAGNOSIS — J4520 Mild intermittent asthma, uncomplicated: Secondary | ICD-10-CM | POA: Diagnosis not present

## 2018-08-13 MED ORDER — ALBUTEROL SULFATE HFA 108 (90 BASE) MCG/ACT IN AERS: 2.0000 | INHALATION_SPRAY | RESPIRATORY_TRACT | 3 refills | Status: DC | PRN

## 2018-08-13 NOTE — Progress Notes (Signed)
Subjective:  Patient ID: Crystal Cooper, female    DOB: [birth year], 85 y.o.   MRN: [id number]  Chief Complaint:  Medical Management of Chronic Issues   HPI: Crystal Cooper is a 85 y.o. female presenting on 08/13/2018 for Medical Management of Chronic Issues   1. Essential hypertension  Complaint with meds - Yes Checking BP at home - No Exercising Regularly - No Watching Salt intake - Yes Pertinent ROS:  Headache - No Chest pain - No Dyspnea - No Palpitations - No LE edema - No They report good compliance with medications and can restate their regimen by memory. No medication side effects.  BP Readings from Last 3 Encounters:  08/13/18 130/65  06/16/18 136/64  05/13/18 (!) 147/68     2. PAF (paroxysmal atrial fibrillation) ([place])  Followed by cardiology. Denies chest pain, shortness of breath, or palpitations. No lower extremity edema or orthopnea.    3. Iron deficiency anemia, unspecified iron deficiency anemia type  Recently restarted Eliquis. Hgb has been stable. Is on iron repletion therapy without associated side effects. Denies abnormal bleeding or bruising. No dark or tarry stools.   4. Age-related osteoporosis without current pathological fracture  Calcium and vitamin D along with Prolia. No problems with therapy.      Relevant past medical, surgical, family, and social history reviewed and updated as indicated.  Allergies and medications reviewed and updated.   Past Medical History:  Diagnosis Date  . Anemia    years ago  . Aortic stenosis   . Arthritis   . Asthma   . Atrial fibrillation ([place])   . CHF (congestive heart failure) ([place]) 11/2014  . Family history of adverse reaction to anesthesia    2 daughters would have N/V  . Glaucoma   . Hearing loss   . Heart murmur   . HTN (hypertension)   . Pneumonia   . Prolapsing mitral leaflet syndrome   . Shortness of breath dyspnea    with exertion  . SVT (supraventricular tachycardia) ([facility])    S/P  ablation of AVNRT in 2003    Past Surgical History:  Procedure Laterality Date  . APPENDECTOMY    . BIOPSY  04/07/2018   Procedure: BIOPSY;  Surgeon: Jerene Bears, MD;  Location: [facility] ENDOSCOPY;  Service: Gastroenterology;;  . BLADDER SURGERY    . CARDIAC CATHETERIZATION N/A 09/26/2015   Procedure: Right/Left Heart Cath and Coronary Angiography;  Surgeon: Burnell Blanks, MD;  Location: [place] CV LAB;  Service: Cardiovascular;  Laterality: N/A;  . CARDIAC SURGERY    . CARDIOVERSION N/A 03/02/2016   Procedure: CARDIOVERSION;  Surgeon: Thayer Headings, MD;  Location: [place];  Service: Cardiovascular;  Laterality: N/A;  . EP IMPLANTABLE DEVICE N/A 10/26/2015   Procedure: Pacemaker Implant;  Surgeon: Will Meredith Leeds, MD;  Location: [place] CV LAB;  Service: Cardiovascular;  Laterality: N/A;  . ESOPHAGOGASTRODUODENOSCOPY (EGD) WITH PROPOFOL N/A 04/07/2018   Procedure: ESOPHAGOGASTRODUODENOSCOPY (EGD) WITH PROPOFOL;  Surgeon: Jerene Bears, MD;  Location[facility] ENDOSCOPY;  Service: Gastroenterology;  Laterality: N/A;  . EYE SURGERY Bilateral    cataract surgery  . HOT HEMOSTASIS N/A 04/07/2018   Procedure: HOT HEMOSTASIS (ARGON PLASMA COAGULATION/BICAP);  Surgeon: Jerene Bears, MD;  Location: [facility] ENDOSCOPY;  Service: Gastroenterology;  Laterality: N/A;  . NASAL SINUS SURGERY    . TEE WITHOUT CARDIOVERSION N/A 10/25/2015   Procedure: TRANSESOPHAGEAL ECHOCARDIOGRAM (TEE);  Surgeon: Burnell Blanks, MD;  Location: [place];  Service: Open Heart Surgery;  Laterality: N/A;  . TRANSCATHETER AORTIC VALVE REPLACEMENT, TRANSFEMORAL Right 10/25/2015   Procedure: TRANSCATHETER AORTIC VALVE REPLACEMENT, TRANSFEMORAL;  Surgeon: Burnell Blanks, MD;  Location: [place];  Service: Open Heart Surgery;  Laterality: Right;    Social History   Socioeconomic History  . Marital status: Widowed    Spouse name: Not on file  . Number of children: 3  . Years of education: Not on file  .  Highest education level: Not on file  Occupational History  . Not on file  Social Needs  . Financial resource strain: Not on file  . Food insecurity:    Worry: Not on file    Inability: Not on file  . Transportation needs:    Medical: Not on file    Non-medical: Not on file  Tobacco Use  . Smoking status: Never Smoker  . Smokeless tobacco: Never Used  Substance and Sexual Activity  . Alcohol use: No    Alcohol/week: 0.0 standard drinks  . Drug use: No  . Sexual activity: Never  Lifestyle  . Physical activity:    Days per week: Not on file    Minutes per session: Not on file  . Stress: Not on file  Relationships  . Social connections:    Talks on phone: Not on file    Gets together: Not on file    Attends religious service: Not on file    Active member of club or organization: Not on file    Attends meetings of clubs or organizations: Not on file    Relationship status: Not on file  . Intimate partner violence:    Fear of current or ex partner: Not on file    Emotionally abused: Not on file    Physically abused: Not on file    Forced sexual activity: Not on file  Other Topics Concern  . Not on file  Social History Narrative  . Not on file    Outpatient Encounter Medications as of 08/13/2018  Medication Sig  . acetaminophen (TYLENOL) 500 MG tablet Take 500 mg by mouth every 6 (six) hours as needed for mild pain.  [name] albuterol (PROVENTIL HFA;VENTOLIN HFA) 108 (90 Base) MCG/ACT inhaler Inhale 2 puffs into the lungs every 4 (four) hours as needed for wheezing or shortness of breath.  [name] amLODipine (NORVASC) 2.5 MG tablet Take 1 tablet (2.5 mg total) by mouth daily.  [name] apixaban (ELIQUIS) 5 MG TABS tablet Take 1 tablet (5 mg total) by mouth 2 (two) times daily.  . Biotin 10 MG CAPS Take 1 capsule by mouth daily.  . calcium-vitamin D (OSCAL WITH D) 500-200 MG-UNIT tablet Take 1 tablet by mouth daily.  [name] denosumab (PROLIA) 60 MG/ML SOSY injection Inject 60 mg into the skin  every 6 (six) months.  . docusate sodium (COLACE) 100 MG capsule Take 100 mg by mouth daily as needed for mild constipation.  . ferrous sulfate 325 (65 FE) MG tablet Take 1 tablet (325 mg total) by mouth 2 (two) times daily with a meal.  . furosemide (LASIX) 40 MG tablet TAKE 1 & 1/2 (ONE & ONE-HALF) TABLETS BY MOUTH ONCE DAILY  . multivitamin-iron-minerals-folic acid (CENTRUM) chewable tablet Chew 1 tablet by mouth daily.  . pantoprazole (PROTONIX) 40 MG tablet Take 1 tablet (40 mg total) by mouth daily at 6 (six) AM.   No facility-administered encounter medications on file as of 08/13/2018.     Allergies  Allergen Reactions  . Hctz [  Hydrochlorothiazide] Other (See Comments)    Pt was ill and affected kidneys   . Codeine Other (See Comments)    Unknown  Made pt feel ill, has not had any problems since 1977    Review of Systems  Constitutional: Negative for chills, fatigue, fever and unexpected weight change.  HENT: Positive for hearing loss (chronic). Negative for congestion.   Eyes: Negative for photophobia and visual disturbance.  Respiratory: Positive for cough. Negative for chest tightness, shortness of breath and wheezing.   Cardiovascular: Negative for chest pain, palpitations and leg swelling.  Gastrointestinal: Negative for abdominal pain, anal bleeding, blood in stool, constipation, diarrhea, nausea and vomiting.  Endocrine: Negative for cold intolerance, heat intolerance, polydipsia, polyphagia and polyuria.  Genitourinary: Negative for decreased urine volume and difficulty urinating.  Musculoskeletal: Negative for arthralgias and myalgias.  Neurological: Negative for dizziness, tremors, seizures, syncope, facial asymmetry, speech difficulty, weakness, light-headedness, numbness and headaches.  Psychiatric/Behavioral: Negative for confusion.  All other systems reviewed and are negative.       Objective:  BP 130/65   Pulse 76   Temp (!) 97.1 F (36.2 C) (Oral)   Ht  _0  (1.575 m)   Wt 111 lb (50.3 kg)   BMI 20.30 kg/m    Wt Readings from Last 3 Encounters:  08/13/18 111 lb (50.3 kg)  06/16/18 110 lb 9.6 oz (50.2 kg)  05/13/18 112 lb (50.8 kg)    Physical Exam Vitals signs and nursing note reviewed.  Constitutional:      General: She is not in acute distress.    Appearance: Normal appearance. She is well-developed and well-groomed. She is not ill-appearing or toxic-appearing.  HENT:     Head: Normocephalic and atraumatic.     Right Ear: Tympanic membrane, ear canal and external ear normal. Decreased hearing (chronic) noted.     Left Ear: Tympanic membrane, ear canal and external ear normal. Decreased hearing (chronic) noted.     Nose: Nose normal.     Mouth/Throat:     Lips: Pink.     Mouth: Mucous membranes are moist.     Pharynx: Oropharynx is clear. Uvula midline.  Neck:     Musculoskeletal: Full passive range of motion without pain and neck supple.     Thyroid: No thyroid mass, thyromegaly or thyroid tenderness.     Vascular: No carotid bruit or JVD.  Cardiovascular:     Rate and Rhythm: Normal rate and regular rhythm.     Chest Wall: PMI is not displaced.     Pulses: Normal pulses.     Heart sounds: Normal heart sounds. No murmur. No friction rub. No gallop.   Pulmonary:     Effort: Pulmonary effort is normal. No respiratory distress.     Breath sounds: Normal breath sounds.  Abdominal:     General: Bowel sounds are normal.     Palpations: Abdomen is soft.     Tenderness: There is no abdominal tenderness.  Lymphadenopathy:     Cervical: No cervical adenopathy.  Skin:    General: Skin is warm and dry.     Capillary Refill: Capillary refill takes less than 2 seconds.     Coloration: Skin is not pale.  Neurological:     General: No focal deficit present.     Mental Status: She is alert and oriented to person, place, and time.  Psychiatric:        Mood and Affect: Mood normal.        Behavior:  Behavior normal. Behavior is  cooperative.        Thought Content: Thought content normal.        Judgment: Judgment normal.     Results for orders placed or performed in visit on 07/18/18  CBC  Result Value Ref Range   WBC 5.3 3.4 - 10.8 x10E3/uL   RBC 3.52 (L) 3.77 - 5.28 x10E6/uL   Hemoglobin 11.2 11.1 - 15.9 g/dL   Hematocrit 32.0 (L) 34.0 - 46.6 %   MCV 91 79 - 97 fL   MCH 31.8 26.6 - 33.0 pg   MCHC 35.0 31.5 - 35.7 g/dL   RDW 12.2 11.7 - 15.4 %   Platelets 172 150 - 450 x10E3/uL       Pertinent labs & imaging results that were available during my care of the patient were reviewed by me and considered in my medical decision making.  Assessment & Plan:  Murrell was seen today for medical management of chronic issues.  Diagnoses and all orders for this visit:  Essential hypertension Stable. Continue medications as prescribed. Labs to be drawn Friday.  -     CMP14+EGFR -     CBC with Differential/Platelet  PAF (paroxysmal atrial fibrillation) ([place]) Has restarted Eliquis. Has follow up with cardiology next week. Labs to be drawn Friday.  -     CMP14+EGFR -     CBC with Differential/Platelet  Iron deficiency anemia, unspecified iron deficiency anemia type Has restarted Eliquis. Last Hgb normal. Will have redrawn this Friday. Continue Iron repletion therapy.  -     CBC with Differential/Platelet  Age-related osteoporosis without current pathological fracture Due for prolia injection this month     Continue all other maintenance medications.  Follow up plan: Return in about 3 months (around 11/11/2018), or if symptoms worsen or fail to improve, for HTN.   The above assessment and management plan was discussed with the patient. The patient verbalized understanding of and has agreed to the management plan. Patient is aware to call the clinic if symptoms persist or worsen. Patient is aware when to return to the clinic for a follow-up visit. Patient educated on when it is appropriate to go to the  emergency department.   Monia Pouch, FNP-C [facility] [phone]

## 2018-08-14 ENCOUNTER — Ambulatory Visit: Payer: Medicare Other | Admitting: Pediatrics

## 2018-08-15 ENCOUNTER — Other Ambulatory Visit: Payer: Medicare Other

## 2018-08-15 DIAGNOSIS — I480 Paroxysmal atrial fibrillation: Secondary | ICD-10-CM | POA: Diagnosis not present

## 2018-08-15 DIAGNOSIS — I10 Essential (primary) hypertension: Secondary | ICD-10-CM | POA: Diagnosis not present

## 2018-08-15 LAB — CUP PACEART REMOTE DEVICE CHECK
Brady Statistic AP VP Percent: 6.1 %
Brady Statistic AP VS Percent: 10.7 %
Brady Statistic AS VP Percent: 9.43 %
Brady Statistic RA Percent Paced: 16.45 %
Brady Statistic RV Percent Paced: 15.86 %
Implantable Lead Implant Date: 20170503
Implantable Lead Location: 753859
Implantable Lead Location: 753860
Implantable Lead Model: 5076
Implantable Lead Model: 5076
Lead Channel Impedance Value: 304 Ohm
Lead Channel Impedance Value: 342 Ohm
Lead Channel Impedance Value: 437 Ohm
Lead Channel Pacing Threshold Amplitude: 1.125 V
Lead Channel Pacing Threshold Pulse Width: 0.4 ms
Lead Channel Sensing Intrinsic Amplitude: 8.875 mV
Lead Channel Sensing Intrinsic Amplitude: 8.875 mV
Lead Channel Setting Pacing Amplitude: 2 V
Lead Channel Setting Pacing Amplitude: 2.5 V
Lead Channel Setting Pacing Pulse Width: 0.4 ms
MDC IDC LEAD IMPLANT DT: 20170503
MDC IDC MSMT BATTERY REMAINING LONGEVITY: 87 mo
MDC IDC MSMT BATTERY VOLTAGE: 3.01 V
MDC IDC MSMT LEADCHNL RA PACING THRESHOLD AMPLITUDE: 0.875 V
MDC IDC MSMT LEADCHNL RA SENSING INTR AMPL: 4.375 mV
MDC IDC MSMT LEADCHNL RA SENSING INTR AMPL: 4.375 mV
MDC IDC MSMT LEADCHNL RV IMPEDANCE VALUE: 437 Ohm
MDC IDC MSMT LEADCHNL RV PACING THRESHOLD PULSEWIDTH: 0.4 ms
MDC IDC PG IMPLANT DT: 20170503
MDC IDC SESS DTM: 20200219150313
MDC IDC SET LEADCHNL RV SENSING SENSITIVITY: 2.8 mV
MDC IDC STAT BRADY AS VS PERCENT: 73.77 %

## 2018-08-16 LAB — CMP14+EGFR
A/G RATIO: 1.6 (ref 1.2–2.2)
ALK PHOS: 61 IU/L (ref 39–117)
ALT: 15 IU/L (ref 0–32)
AST: 24 IU/L (ref 0–40)
Albumin: 4.2 g/dL (ref 3.6–4.6)
BILIRUBIN TOTAL: 0.3 mg/dL (ref 0.0–1.2)
BUN / CREAT RATIO: 17 (ref 12–28)
BUN: 17 mg/dL (ref 8–27)
CALCIUM: 8.9 mg/dL (ref 8.7–10.3)
CO2: 22 mmol/L (ref 20–29)
CREATININE: 0.98 mg/dL (ref 0.57–1.00)
Chloride: 98 mmol/L (ref 96–106)
GFR calc Af Amer: 59 mL/min/{1.73_m2} — ABNORMAL LOW (ref >59)
GFR calc non Af Amer: 51 mL/min/{1.73_m2} — ABNORMAL LOW (ref >59)
GLOBULIN, TOTAL: 2.7 g/dL (ref 1.5–4.5)
Glucose: 95 mg/dL (ref 65–99)
Potassium: 4.3 mmol/L (ref 3.5–5.2)
SODIUM: 139 mmol/L (ref 134–144)
Total Protein: 6.9 g/dL (ref 6.0–8.5)

## 2018-08-16 LAB — CBC
HEMATOCRIT: 27.5 % — AB (ref 34.0–46.6)
Hemoglobin: 9.4 g/dL — ABNORMAL LOW (ref 11.1–15.9)
MCH: 32.3 pg (ref 26.6–33.0)
MCHC: 34.2 g/dL (ref 31.5–35.7)
MCV: 95 fL (ref 79–97)
Platelets: 199 10*3/uL (ref 150–450)
RBC: 2.91 x10E6/uL — ABNORMAL LOW (ref 3.77–5.28)
RDW: 13.2 % (ref 11.7–15.4)
WBC: 4.5 10*3/uL (ref 3.4–10.8)

## 2018-08-21 NOTE — Progress Notes (Signed)
Remote pacemaker transmission.   

## 2018-08-22 ENCOUNTER — Encounter: Payer: Self-pay | Admitting: Cardiology

## 2018-08-22 ENCOUNTER — Ambulatory Visit (INDEPENDENT_AMBULATORY_CARE_PROVIDER_SITE_OTHER): Payer: Medicare Other | Admitting: Cardiology

## 2018-08-22 VITALS — BP 118/62 | HR 71 | Ht 62.0 in | Wt 113.0 lb

## 2018-08-22 DIAGNOSIS — I480 Paroxysmal atrial fibrillation: Secondary | ICD-10-CM | POA: Diagnosis not present

## 2018-08-22 DIAGNOSIS — Z952 Presence of prosthetic heart valve: Secondary | ICD-10-CM

## 2018-08-22 DIAGNOSIS — I10 Essential (primary) hypertension: Secondary | ICD-10-CM | POA: Diagnosis not present

## 2018-08-22 NOTE — Patient Instructions (Signed)
Medication Instructions:  NO CHANGE If you need a refill on your cardiac medications before your next appointment, please call your pharmacy.   Lab work: Your physician recommends that you HAVE LAB WORK TODAY  If you have labs (blood work) drawn today and your tests are completely normal, you will receive your results only by: . MyChart Message (if you have MyChart) OR . A paper copy in the mail If you have any lab test that is abnormal or we need to change your treatment, we will call you to review the results.  Follow-Up: At [facility], you and your health needs are our priority.  As part of our continuing mission to provide you with exceptional heart care, we have created designated Provider Care Teams.  These Care Teams include your primary Cardiologist (physician) and Advanced Practice Providers (APPs -  Physician Assistants and Nurse Practitioners) who all work together to provide you with the care you need, when you need it. Your physician recommends that you schedule a follow-up appointment in: 3 MONTHS WITH DR CRENSHAW      

## 2018-08-23 LAB — CBC
Hematocrit: 29.2 % — ABNORMAL LOW (ref 34.0–46.6)
Hemoglobin: 9.9 g/dL — ABNORMAL LOW (ref 11.1–15.9)
MCH: 31.7 pg (ref 26.6–33.0)
MCHC: 33.9 g/dL (ref 31.5–35.7)
MCV: 94 fL (ref 79–97)
PLATELETS: 197 10*3/uL (ref 150–450)
RBC: 3.12 x10E6/uL — AB (ref 3.77–5.28)
RDW: 13.2 % (ref 11.7–15.4)
WBC: 4.8 10*3/uL (ref 3.4–10.8)

## 2018-08-29 ENCOUNTER — Encounter: Payer: Self-pay | Admitting: Pediatrics

## 2018-09-04 ENCOUNTER — Other Ambulatory Visit: Payer: Self-pay

## 2018-09-04 ENCOUNTER — Other Ambulatory Visit: Payer: Medicare Other

## 2018-09-04 DIAGNOSIS — R710 Precipitous drop in hematocrit: Secondary | ICD-10-CM

## 2018-09-05 LAB — CBC WITH DIFFERENTIAL/PLATELET
Basophils Absolute: 0.1 10*3/uL (ref 0.0–0.2)
Basos: 2 %
EOS (ABSOLUTE): 0.2 10*3/uL (ref 0.0–0.4)
Eos: 4 %
Hematocrit: 32.7 % — ABNORMAL LOW (ref 34.0–46.6)
Hemoglobin: 10.6 g/dL — ABNORMAL LOW (ref 11.1–15.9)
Immature Grans (Abs): 0 10*3/uL (ref 0.0–0.1)
Immature Granulocytes: 0 %
Lymphocytes Absolute: 1 10*3/uL (ref 0.7–3.1)
Lymphs: 24 %
MCH: 31.3 pg (ref 26.6–33.0)
MCHC: 32.4 g/dL (ref 31.5–35.7)
MCV: 97 fL (ref 79–97)
Monocytes Absolute: 0.5 10*3/uL (ref 0.1–0.9)
Monocytes: 12 %
Neutrophils Absolute: 2.3 10*3/uL (ref 1.4–7.0)
Neutrophils: 58 %
PLATELETS: 210 10*3/uL (ref 150–450)
RBC: 3.39 x10E6/uL — AB (ref 3.77–5.28)
RDW: 12.4 % (ref 11.7–15.4)
WBC: 4 10*3/uL (ref 3.4–10.8)

## 2018-10-06 ENCOUNTER — Telehealth: Payer: Self-pay | Admitting: Family Medicine

## 2018-10-07 NOTE — Telephone Encounter (Signed)
Will change to televisit - [name] aware

## 2018-10-07 NOTE — Telephone Encounter (Signed)
She can be a telephone visit

## 2018-10-08 ENCOUNTER — Other Ambulatory Visit: Payer: Self-pay

## 2018-10-08 ENCOUNTER — Ambulatory Visit (INDEPENDENT_AMBULATORY_CARE_PROVIDER_SITE_OTHER): Payer: Medicare Other | Admitting: Family Medicine

## 2018-10-08 ENCOUNTER — Encounter: Payer: Self-pay | Admitting: Family Medicine

## 2018-10-08 DIAGNOSIS — I10 Essential (primary) hypertension: Secondary | ICD-10-CM

## 2018-10-08 DIAGNOSIS — I480 Paroxysmal atrial fibrillation: Secondary | ICD-10-CM

## 2018-10-08 DIAGNOSIS — K219 Gastro-esophageal reflux disease without esophagitis: Secondary | ICD-10-CM

## 2018-10-08 MED ORDER — PANTOPRAZOLE SODIUM 40 MG PO TBEC: 40.0000 mg | DELAYED_RELEASE_TABLET | Freq: Every day | ORAL | 1 refills | Status: DC

## 2018-10-08 MED ORDER — AMLODIPINE BESYLATE 2.5 MG PO TABS: 2.5000 mg | ORAL_TABLET | Freq: Every day | ORAL | 1 refills | Status: DC

## 2018-10-08 NOTE — Progress Notes (Signed)
Virtual Visit via telephone Note Due to COVID-19, visit is conducted virtually and was requested by patient.  I connected with Crystal Cooper on 10/08/18 at 1305 by telephone and verified that I am speaking with the correct person using two identifiers. Crystal Cooper is currently located at home and family is currently with them during visit. The provider, Monia Pouch, FNP is located in their office at time of visit.  I discussed the limitations, risks, security and privacy concerns of performing an evaluation and management service by telephone and the availability of in person appointments. I also discussed with the patient that there may be a patient responsible charge related to this service. The patient expressed understanding and agreed to proceed.  Subjective:  Patient ID: Crystal Cooper, female    DOB: [birth year], 85 y.o.   MRN: [id number]  Chief Complaint:  Medical Management of Chronic Issues   HPI: Crystal Cooper is a 85 y.o. female presenting on 10/08/2018 for Medical Management of Chronic Issues   1. Essential hypertension Complaint with meds - Yes Checking BP at home - No Exercising Regularly - No Watching Salt intake - Yes Pertinent ROS:  Headache - No Chest pain - No Dyspnea - No Palpitations - No LE edema - No They report good compliance with medications and can restate their regimen by memory. No medication side effects.  BP Readings from Last 3 Encounters:  08/22/18 118/62  08/13/18 130/65  06/16/18 136/64     2. PAF (paroxysmal atrial fibrillation) ([place]) Followed by cardiology. Hgb recently dropped again so Apixaban was stopped. Pt denies chest pain, palpitations, shortness of breath, fatigue, or dizziness. No abnormal bruising or bleeding. Last Hgb 10.6on 09/04/2018, will recheck in one month. Pt is taking iron as prescribed.   3. GERD without esophagitis Well controlled with current medications. Denies cough, sore throat, trouble swallowing,  voice changes, or hemoptysis.     Relevant past medical, surgical, family, and social history reviewed and updated as indicated.  Allergies and medications reviewed and updated.   Past Medical History:  Diagnosis Date  . Anemia    years ago  . Aortic stenosis   . Arthritis   . Asthma   . Atrial fibrillation ([place])   . CHF (congestive heart failure) ([place]) 11/2014  . Family history of adverse reaction to anesthesia    2 daughters would have N/V  . Glaucoma   . Hearing loss   . Heart murmur   . HTN (hypertension)   . Pneumonia   . Prolapsing mitral leaflet syndrome   . Shortness of breath dyspnea    with exertion  . SVT (supraventricular tachycardia) ([facility])    S/P ablation of AVNRT in 2003    Past Surgical History:  Procedure Laterality Date  . APPENDECTOMY    . BIOPSY  04/07/2018   Procedure: BIOPSY;  Surgeon: Jerene Bears, MD;  Location: [facility] ENDOSCOPY;  Service: Gastroenterology;;  . BLADDER SURGERY    . CARDIAC CATHETERIZATION N/A 09/26/2015   Procedure: Right/Left Heart Cath and Coronary Angiography;  Surgeon: Burnell Blanks, MD;  Location: [place] CV LAB;  Service: Cardiovascular;  Laterality: N/A;  . CARDIAC SURGERY    . CARDIOVERSION N/A 03/02/2016   Procedure: CARDIOVERSION;  Surgeon: Thayer Headings, MD;  Location: [place];  Service: Cardiovascular;  Laterality: N/A;  . EP IMPLANTABLE DEVICE N/A 10/26/2015   Procedure: Pacemaker Implant;  Surgeon: Will Meredith Leeds, MD;  Location: [place] CV LAB;  Service:  Cardiovascular;  Laterality: N/A;  . ESOPHAGOGASTRODUODENOSCOPY (EGD) WITH PROPOFOL N/A 04/07/2018   Procedure: ESOPHAGOGASTRODUODENOSCOPY (EGD) WITH PROPOFOL;  Surgeon: Jerene Bears, MD;  Location: [facility] ENDOSCOPY;  Service: Gastroenterology;  Laterality: N/A;  . EYE SURGERY Bilateral    cataract surgery  . HOT HEMOSTASIS N/A 04/07/2018   Procedure: HOT HEMOSTASIS (ARGON PLASMA COAGULATION/BICAP);  Surgeon: Jerene Bears, MD;  Location: [facility]  ENDOSCOPY;  Service: Gastroenterology;  Laterality: N/A;  . NASAL SINUS SURGERY    . TEE WITHOUT CARDIOVERSION N/A 10/25/2015   Procedure: TRANSESOPHAGEAL ECHOCARDIOGRAM (TEE);  Surgeon: Burnell Blanks, MD;  Location: [place];  Service: Open Heart Surgery;  Laterality: N/A;  . TRANSCATHETER AORTIC VALVE REPLACEMENT, TRANSFEMORAL Right 10/25/2015   Procedure: TRANSCATHETER AORTIC VALVE REPLACEMENT, TRANSFEMORAL;  Surgeon: Burnell Blanks, MD;  Location: [place];  Service: Open Heart Surgery;  Laterality: Right;    Social History   Socioeconomic History  . Marital status: Widowed    Spouse name: Not on file  . Number of children: 3  . Years of education: Not on file  . Highest education level: Not on file  Occupational History  . Not on file  Social Needs  . Financial resource strain: Not on file  . Food insecurity:    Worry: Not on file    Inability: Not on file  . Transportation needs:    Medical: Not on file    Non-medical: Not on file  Tobacco Use  . Smoking status: Never Smoker  . Smokeless tobacco: Never Used  Substance and Sexual Activity  . Alcohol use: No    Alcohol/week: 0.0 standard drinks  . Drug use: No  . Sexual activity: Never  Lifestyle  . Physical activity:    Days per week: Not on file    Minutes per session: Not on file  . Stress: Not on file  Relationships  . Social connections:    Talks on phone: Not on file    Gets together: Not on file    Attends religious service: Not on file    Active member of club or organization: Not on file    Attends meetings of clubs or organizations: Not on file    Relationship status: Not on file  . Intimate partner violence:    Fear of current or ex partner: Not on file    Emotionally abused: Not on file    Physically abused: Not on file    Forced sexual activity: Not on file  Other Topics Concern  . Not on file  Social History Narrative  . Not on file    Outpatient Encounter Medications as of 10/08/2018   Medication Sig  . acetaminophen (TYLENOL) 500 MG tablet Take 500 mg by mouth every 6 (six) hours as needed for mild pain.  [name] albuterol (PROVENTIL HFA;VENTOLIN HFA) 108 (90 Base) MCG/ACT inhaler Inhale 2 puffs into the lungs every 4 (four) hours as needed for wheezing or shortness of breath.  [name] amLODipine (NORVASC) 2.5 MG tablet Take 1 tablet (2.5 mg total) by mouth daily.  . Biotin 10 MG CAPS Take 1 capsule by mouth daily.  . calcium-vitamin D (OSCAL WITH D) 500-200 MG-UNIT tablet Take 1 tablet by mouth daily.  [name] denosumab (PROLIA) 60 MG/ML SOSY injection Inject 60 mg into the skin every 6 (six) months.  . docusate sodium (COLACE) 100 MG capsule Take 100 mg by mouth daily as needed for mild constipation.  . ferrous sulfate 325 (65 FE) MG tablet Take 1 tablet (  325 mg total) by mouth 2 (two) times daily with a meal.  . furosemide (LASIX) 40 MG tablet TAKE 1 & 1/2 (ONE & ONE-HALF) TABLETS BY MOUTH ONCE DAILY  . multivitamin-iron-minerals-folic acid (CENTRUM) chewable tablet Chew 1 tablet by mouth daily.  . pantoprazole (PROTONIX) 40 MG tablet Take 1 tablet (40 mg total) by mouth daily at 6 (six) AM.  . [DISCONTINUED] amLODipine (NORVASC) 2.5 MG tablet Take 1 tablet (2.5 mg total) by mouth daily.  . [DISCONTINUED] pantoprazole (PROTONIX) 40 MG tablet Take 1 tablet (40 mg total) by mouth daily at 6 (six) AM.   No facility-administered encounter medications on file as of 10/08/2018.     Allergies  Allergen Reactions  . Hctz [Hydrochlorothiazide] Other (See Comments)    Pt was ill and affected kidneys   . Codeine Other (See Comments)    Unknown  Made pt feel ill, has not had any problems since 1977    Review of Systems  Constitutional: Negative for activity change, appetite change, chills, diaphoresis, fatigue, fever and unexpected weight change.  Eyes: Negative for photophobia and visual disturbance.  Respiratory: Negative for cough and shortness of breath.   Cardiovascular: Negative for  chest pain, palpitations and leg swelling.  Gastrointestinal: Negative for abdominal distention, abdominal pain, anal bleeding, blood in stool, constipation, diarrhea, nausea, rectal pain and vomiting.  Endocrine: Negative for polydipsia, polyphagia and polyuria.  Genitourinary: Negative for decreased urine volume and difficulty urinating.  Musculoskeletal: Negative for arthralgias and myalgias.  Skin: Negative for color change and pallor.  Neurological: Negative for dizziness, tremors, seizures, syncope, facial asymmetry, speech difficulty, weakness, light-headedness, numbness and headaches.  Hematological: Does not bruise/bleed easily.  Psychiatric/Behavioral: Negative for confusion.  All other systems reviewed and are negative.        Observations/Objective: No vital signs or physical exam, this was a telephone or virtual health encounter.  Pt alert and oriented, answers all questions appropriately, and able to speak in full sentences.    Assessment and Plan: Chrishawn was seen today for medical management of chronic issues.  Diagnoses and all orders for this visit:  Essential hypertension Stable, continue below.  -     amLODipine (NORVASC) 2.5 MG tablet; Take 1 tablet (2.5 mg total) by mouth daily.  PAF (paroxysmal atrial fibrillation) ([place]) Followed by cardiology.   GERD without esophagitis Stable, continue below. -     pantoprazole (PROTONIX) 40 MG tablet; Take 1 tablet (40 mg total) by mouth daily at 6 (six) AM.     Follow Up Instructions: Return in about 1 month (around 11/07/2018), or if symptoms worsen or fail to improve, for anemia.    I discussed the assessment and treatment plan with the patient. The patient was provided an opportunity to ask questions and all were answered. The patient agreed with the plan and demonstrated an understanding of the instructions.   The patient was advised to call back or seek an in-person evaluation if the symptoms worsen or if the  condition fails to improve as anticipated.  The above assessment and management plan was discussed with the patient. The patient verbalized understanding of and has agreed to the management plan. Patient is aware to call the clinic if symptoms persist or worsen. Patient is aware when to return to the clinic for a follow-up visit. Patient educated on when it is appropriate to go to the emergency department.    I provided 15 minutes of non-face-to-face time during this encounter. The call started at 1305. The  call ended at 1320.   Monia Pouch, FNP-C [facility] [address] [place], [place] [address] [phone]

## 2018-11-10 ENCOUNTER — Telehealth: Payer: Self-pay | Admitting: Cardiology

## 2018-11-10 ENCOUNTER — Ambulatory Visit: Payer: Medicare Other | Admitting: Family Medicine

## 2018-11-10 NOTE — Progress Notes (Signed)
Virtual Visit via Video Note   This visit type was conducted due to national recommendations for restrictions regarding the COVID-19 Pandemic (e.g. social distancing) in an effort to limit this patient's exposure and mitigate transmission in our community.  Due to her co-morbid illnesses, this patient is at least at moderate risk for complications without adequate follow up.  This format is felt to be most appropriate for this patient at this time.  All issues noted in this document were discussed and addressed.  A limited physical exam was performed with this format.  Please refer to the patient's chart for her consent to telehealth for [facility].   Date:  11/11/2018   ID:  Crystal Cooper, DOB [birth year], MRN [id number]  Patient Location: Home Provider Location: Home  PCP:  Baruch Gouty, FNP  Cardiologist:  Dr Stanford Breed  Evaluation Performed:  Follow-Up Visit  Chief Complaint:  FU AVR and atrial fibrillation  History of Present Illness:    FU AVR and PAF. History of ablation of AVNRT in 2003. Echocardiogram repeated February 2017 and showed normal LV systolic function, severe aortic stenosis with mean gradient 43 mmHg. Cath 2/17 showed no significant CAD. Had TAVR 5/17. Had PM 5/17. Also with afib/flutter. Had DCCV 03/02/16. Echocardiogram October 2019 showed normal LV function, moderate diastolic dysfunction, prior aortic valve replacement with mean gradient 6 mmHg, mild mitral regurgitation, biatrial enlargement, mild tricuspid regurgitation and moderately elevated pulmonary pressure.  Patient admitted with GI bleeding October 2019 with hemoglobin 5.4.  Eliquis was held and she was transfused.  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  Since last seen,patient denies dyspnea, chest pain, palpitations, syncope, melena or hematochezia.  The patient does not have symptoms concerning for COVID-19 infection (fever, chills, cough, or new shortness of breath).    Past Medical  History:  Diagnosis Date  . Anemia    years ago  . Aortic stenosis   . Arthritis   . Asthma   . Atrial fibrillation ([place])   . CHF (congestive heart failure) ([place]) 11/2014  . Family history of adverse reaction to anesthesia    2 daughters would have N/V  . Glaucoma   . Hearing loss   . Heart murmur   . HTN (hypertension)   . Pneumonia   . Prolapsing mitral leaflet syndrome   . Shortness of breath dyspnea    with exertion  . SVT (supraventricular tachycardia) ([facility])    S/P ablation of AVNRT in 2003   Past Surgical History:  Procedure Laterality Date  . APPENDECTOMY    . BIOPSY  04/07/2018   Procedure: BIOPSY;  Surgeon: Jerene Bears, MD;  Location: [facility] ENDOSCOPY;  Service: Gastroenterology;;  . BLADDER SURGERY    . CARDIAC CATHETERIZATION N/A 09/26/2015   Procedure: Right/Left Heart Cath and Coronary Angiography;  Surgeon: Burnell Blanks, MD;  Location: [place] CV LAB;  Service: Cardiovascular;  Laterality: N/A;  . CARDIAC SURGERY    . CARDIOVERSION N/A 03/02/2016   Procedure: CARDIOVERSION;  Surgeon: Thayer Headings, MD;  Location: [place];  Service: Cardiovascular;  Laterality: N/A;  . EP IMPLANTABLE DEVICE N/A 10/26/2015   Procedure: Pacemaker Implant;  Surgeon: Will Meredith Leeds, MD;  Location: [place] CV LAB;  Service: Cardiovascular;  Laterality: N/A;  . ESOPHAGOGASTRODUODENOSCOPY (EGD) WITH PROPOFOL N/A 04/07/2018   Procedure: ESOPHAGOGASTRODUODENOSCOPY (EGD) WITH PROPOFOL;  Surgeon: Jerene Bears, MD;  Location: [facility] ENDOSCOPY;  Service: Gastroenterology;  Laterality: N/A;  . EYE SURGERY Bilateral  cataract surgery  . HOT HEMOSTASIS N/A 04/07/2018   Procedure: HOT HEMOSTASIS (ARGON PLASMA COAGULATION/BICAP);  Surgeon: Jerene Bears, MD;  Location: [facility] ENDOSCOPY;  Service: Gastroenterology;  Laterality: N/A;  . NASAL SINUS SURGERY    . TEE WITHOUT CARDIOVERSION N/A 10/25/2015   Procedure: TRANSESOPHAGEAL ECHOCARDIOGRAM (TEE);  Surgeon: Burnell Blanks, MD;  Location: [place];  Service: Open Heart Surgery;  Laterality: N/A;  . TRANSCATHETER AORTIC VALVE REPLACEMENT, TRANSFEMORAL Right 10/25/2015   Procedure: TRANSCATHETER AORTIC VALVE REPLACEMENT, TRANSFEMORAL;  Surgeon: Burnell Blanks, MD;  Location: [place];  Service: Open Heart Surgery;  Laterality: Right;     Current Meds  Medication Sig  . acetaminophen (TYLENOL) 500 MG tablet Take 500 mg by mouth every 6 (six) hours as needed for mild pain.  [name] albuterol (PROVENTIL HFA;VENTOLIN HFA) 108 (90 Base) MCG/ACT inhaler Inhale 2 puffs into the lungs every 4 (four) hours as needed for wheezing or shortness of breath.  [name] amLODipine (NORVASC) 2.5 MG tablet Take 1 tablet (2.5 mg total) by mouth daily.  . Biotin 10 MG CAPS Take 1 capsule by mouth daily.  . calcium-vitamin D (OSCAL WITH D) 500-200 MG-UNIT tablet Take 1 tablet by mouth daily.  [name] denosumab (PROLIA) 60 MG/ML SOSY injection Inject 60 mg into the skin every 6 (six) months.  . docusate sodium (COLACE) 100 MG capsule Take 100 mg by mouth daily as needed for mild constipation.  . ferrous sulfate 325 (65 FE) MG tablet Take 1 tablet (325 mg total) by mouth 2 (two) times daily with a meal.  . furosemide (LASIX) 40 MG tablet TAKE 1 & 1/2 (ONE & ONE-HALF) TABLETS BY MOUTH ONCE DAILY  . multivitamin-iron-minerals-folic acid (CENTRUM) chewable tablet Chew 1 tablet by mouth daily.  . pantoprazole (PROTONIX) 40 MG tablet Take 1 tablet (40 mg total) by mouth daily at 6 (six) AM.     Allergies:   Hctz [hydrochlorothiazide] and Codeine   Social History   Tobacco Use  . Smoking status: Never Smoker  . Smokeless tobacco: Never Used  Substance Use Topics  . Alcohol use: No    Alcohol/week: 0.0 standard drinks  . Drug use: No     Family Hx: The patient's family history includes Hypertension in her mother; Pneumonia in her father. There is no history of Heart attack, Colon cancer, or Esophageal cancer.  ROS:   Please see the history  of present illness.    No fevers, chills or productive cough. All other systems reviewed and are negative.  Recent Labs: 04/06/2018: B Natriuretic Peptide 790.9 08/15/2018: ALT 15; BUN 17; Creatinine, Ser 0.98; Potassium 4.3; Sodium 139 09/04/2018: Hemoglobin 10.6; Platelets 210    Wt Readings from Last 3 Encounters:  11/11/18 110 lb (49.9 kg)  08/22/18 113 lb (51.3 kg)  08/13/18 111 lb (50.3 kg)     Objective:    Vital Signs:  Ht 5\' 2"  (1.575 m)   Wt 110 lb (49.9 kg)   BMI 20.12 kg/m    VITAL SIGNS:  reviewed  No acute distress Answers questions appropriately Normal affect Remainder of physical examination not performed (telehealth visit; coronavirus pandemic)  ASSESSMENT & PLAN:    1. Status post TAVR-plan to continue SBE prophylaxis. 2. Paroxysmal atrial fibrillation-by history patient remains in sinus rhythm.  Her apixaban has not been resumed due to previous anemia and GI bleed.  She has not had melena or hematochezia.  She is scheduled to have blood work done with primary care today.  We will await hemoglobin.  If it has improved we will resume apixaban and follow hemoglobin closely.  If she has recurrent GI bleeding would need to discontinue at that time. 3. Prior pacemaker-followed by electrophysiology. 4. Hypertension-Continue present medications and follow. 5. Chronic diastolic congestive heart failure-patient is euvolemic by history.  We will continue present dose of Lasix.  Continue fluid restriction and low-sodium diet.  COVID-19 Education: The importance of social distancing was discussed today.  Time:   Today, I have spent 12 minutes with the patient with telehealth technology discussing the above problems.     Medication Adjustments/Labs and Tests Ordered: Current medicines are reviewed at length with the patient today.  Concerns regarding medicines are outlined above.   Tests Ordered: No orders of the defined types were placed in this encounter.    Medication Changes: No orders of the defined types were placed in this encounter.   Disposition:  Follow up in 4 month(s)  Signed, [name], MD  11/11/2018 11:08 AM    [place]

## 2018-11-11 ENCOUNTER — Other Ambulatory Visit: Payer: Self-pay

## 2018-11-11 ENCOUNTER — Encounter: Payer: Self-pay | Admitting: Family Medicine

## 2018-11-11 ENCOUNTER — Ambulatory Visit (INDEPENDENT_AMBULATORY_CARE_PROVIDER_SITE_OTHER): Payer: Medicare Other | Admitting: Family Medicine

## 2018-11-11 ENCOUNTER — Encounter: Payer: Self-pay | Admitting: *Deleted

## 2018-11-11 ENCOUNTER — Encounter: Payer: Self-pay | Admitting: Cardiology

## 2018-11-11 ENCOUNTER — Telehealth (INDEPENDENT_AMBULATORY_CARE_PROVIDER_SITE_OTHER): Payer: Medicare Other | Admitting: Cardiology

## 2018-11-11 VITALS — Ht 62.0 in | Wt 110.0 lb

## 2018-11-11 VITALS — BP 152/69 | HR 75 | Temp 97.6°F | Ht 62.0 in | Wt 111.0 lb

## 2018-11-11 DIAGNOSIS — I10 Essential (primary) hypertension: Secondary | ICD-10-CM

## 2018-11-11 DIAGNOSIS — Z952 Presence of prosthetic heart valve: Secondary | ICD-10-CM

## 2018-11-11 DIAGNOSIS — I480 Paroxysmal atrial fibrillation: Secondary | ICD-10-CM

## 2018-11-11 DIAGNOSIS — D509 Iron deficiency anemia, unspecified: Secondary | ICD-10-CM

## 2018-11-11 DIAGNOSIS — E871 Hypo-osmolality and hyponatremia: Secondary | ICD-10-CM

## 2018-11-11 DIAGNOSIS — N183 Chronic kidney disease, stage 3 unspecified: Secondary | ICD-10-CM

## 2018-11-11 DIAGNOSIS — M810 Age-related osteoporosis without current pathological fracture: Secondary | ICD-10-CM | POA: Diagnosis not present

## 2018-11-11 DIAGNOSIS — I5032 Chronic diastolic (congestive) heart failure: Secondary | ICD-10-CM

## 2018-11-11 MED ORDER — DENOSUMAB 60 MG/ML ~~LOC~~ SOSY
60.0000 mg | PREFILLED_SYRINGE | Freq: Once | SUBCUTANEOUS | Status: AC
  Administered 2018-11-11: 17:00:00 60 mg via SUBCUTANEOUS

## 2018-11-11 NOTE — Progress Notes (Signed)
PROLIA: Summary of Benefits Interpretation  Nov 11, 2018     Purchase Information  Last purchase location: [place] . Primary: Medicare  . Secondary: [employer] (If Medicaid, the patient's cost will be $0 and can be purchased at their local pharmacy through their Rx Benefits)  Summary of Benefits  . Received on: 11/11/2018 . Estimated Cost to Patient: $0 . Prior authorization required: No   . Physician Purchase Covered: Yes  . Specialty Pharmacy Covered:No    Patient notified that Prolia was covered at 100% with her secondary insurance.  Injection administered 11/11/2018.    Hulen Skains, Katherleen Folkes M   11/11/2018 [place] [phone]

## 2018-11-11 NOTE — Patient Instructions (Signed)
Medication Instructions:  NO CHANGE If you need a refill on your cardiac medications before your next appointment, please call your pharmacy.   Lab work: If you have labs (blood work) drawn today and your tests are completely normal, you will receive your results only by: [name] MyChart Message (if you have MyChart) OR . A paper copy in the mail If you have any lab test that is abnormal or we need to change your treatment, we will call you to review the results.  Follow-Up: At [facility], you and your health needs are our priority.  As part of our continuing mission to provide you with exceptional heart care, we have created designated Provider Care Teams.  These Care Teams include your primary Cardiologist (physician) and Advanced Practice Providers (APPs -  Physician Assistants and Nurse Practitioners) who all work together to provide you with the care you need, when you need it. Your physician recommends that you schedule a follow-up appointment in:  [place]

## 2018-11-11 NOTE — Progress Notes (Signed)
Pt given Prolia Tolerated well Buy and bill

## 2018-11-11 NOTE — Progress Notes (Signed)
Subjective:  Patient ID: Crystal Cooper, female    DOB: March 02, 1927, 85 y.o.   MRN: [id number]  Chief Complaint:  Anemia (1 mo follow up ); Hyperlipidemia; Hypothyroidism; and Hypertension   HPI: Crystal Cooper is a 85 y.o. female presenting on 11/11/2018 for Anemia (1 mo follow up ); Hyperlipidemia; Hypothyroidism; and Hypertension   1. Essential hypertension  Complaint with meds - Yes Checking BP at home ranging 145/85 Exercising Regularly - No Watching Salt intake - Yes Pertinent ROS:  Headache - No Chest pain - No Dyspnea - Yes, exertional Palpitations - No LE edema - No They report good compliance with medications and can restate their regimen by memory. No medication side effects.  BP Readings from Last 3 Encounters:  11/11/18 (!) 152/69  08/22/18 118/62  08/13/18 130/65     2. Iron deficiency anemia, unspecified iron deficiency anemia type  Iron deficiency anemia due to GI bleed and Eliquis. Has been off of Eliquis for several months and Hgb and Hct are trending up. She denies hematochezia or melena. No abnormal bruising or bleeding. States she has more energy and is not feeling as weak or short of breath. States she feels much better than she did a few weeks back.   3. Hyponatremia  Previous hyponatremia. Goes have slight generalized weakness today. No confusion, nausea, headache, or dizziness.    4. Age-related osteoporosis without current pathological fracture  Had first Prolia injection in 01/2018. States they were working with insurance to see if the next injection would be covered. Will get injection today if approved by insurance. Does take calcium and vit D repletion daily.      Relevant past medical, surgical, family, and social history reviewed and updated as indicated.  Allergies and medications reviewed and updated.   Past Medical History:  Diagnosis Date  . Anemia    years ago  . Aortic stenosis   . Arthritis   . Asthma   . Atrial  fibrillation ([place])   . CHF (congestive heart failure) ([place]) 11/2014  . Family history of adverse reaction to anesthesia    2 daughters would have N/V  . Glaucoma   . Hearing loss   . Heart murmur   . HTN (hypertension)   . Pneumonia   . Prolapsing mitral leaflet syndrome   . Shortness of breath dyspnea    with exertion  . SVT (supraventricular tachycardia) ([facility])    S/P ablation of AVNRT in 2003    Past Surgical History:  Procedure Laterality Date  . APPENDECTOMY    . BIOPSY  04/07/2018   Procedure: BIOPSY;  Surgeon: Jerene Bears, MD;  Location: [facility] ENDOSCOPY;  Service: Gastroenterology;;  . BLADDER SURGERY    . CARDIAC CATHETERIZATION N/A 09/26/2015   Procedure: Right/Left Heart Cath and Coronary Angiography;  Surgeon: Burnell Blanks, MD;  Location: [place] CV LAB;  Service: Cardiovascular;  Laterality: N/A;  . CARDIAC SURGERY    . CARDIOVERSION N/A 03/02/2016   Procedure: CARDIOVERSION;  Surgeon: Thayer Headings, MD;  Location: [place];  Service: Cardiovascular;  Laterality: N/A;  . EP IMPLANTABLE DEVICE N/A 10/26/2015   Procedure: Pacemaker Implant;  Surgeon: Will Meredith Leeds, MD;  Location: [place] CV LAB;  Service: Cardiovascular;  Laterality: N/A;  . ESOPHAGOGASTRODUODENOSCOPY (EGD) WITH PROPOFOL N/A 04/07/2018   Procedure: ESOPHAGOGASTRODUODENOSCOPY (EGD) WITH PROPOFOL;  Surgeon: Jerene Bears, MD;  Location: [facility] ENDOSCOPY;  Service: Gastroenterology;  Laterality: N/A;  . EYE SURGERY Bilateral  cataract surgery  . HOT HEMOSTASIS N/A 04/07/2018   Procedure: HOT HEMOSTASIS (ARGON PLASMA COAGULATION/BICAP);  Surgeon: Jerene Bears, MD;  Location: [facility] ENDOSCOPY;  Service: Gastroenterology;  Laterality: N/A;  . NASAL SINUS SURGERY    . TEE WITHOUT CARDIOVERSION N/A 10/25/2015   Procedure: TRANSESOPHAGEAL ECHOCARDIOGRAM (TEE);  Surgeon: Burnell Blanks, MD;  Location: [place];  Service: Open Heart Surgery;  Laterality: N/A;  . TRANSCATHETER AORTIC VALVE  REPLACEMENT, TRANSFEMORAL Right 10/25/2015   Procedure: TRANSCATHETER AORTIC VALVE REPLACEMENT, TRANSFEMORAL;  Surgeon: Burnell Blanks, MD;  Location: [place];  Service: Open Heart Surgery;  Laterality: Right;    Social History   Socioeconomic History  . Marital status: Widowed    Spouse name: Not on file  . Number of children: 3  . Years of education: Not on file  . Highest education level: Not on file  Occupational History  . Not on file  Social Needs  . Financial resource strain: Not on file  . Food insecurity:    Worry: Not on file    Inability: Not on file  . Transportation needs:    Medical: Not on file    Non-medical: Not on file  Tobacco Use  . Smoking status: Never Smoker  . Smokeless tobacco: Never Used  Substance and Sexual Activity  . Alcohol use: No    Alcohol/week: 0.0 standard drinks  . Drug use: No  . Sexual activity: Never  Lifestyle  . Physical activity:    Days per week: Not on file    Minutes per session: Not on file  . Stress: Not on file  Relationships  . Social connections:    Talks on phone: Not on file    Gets together: Not on file    Attends religious service: Not on file    Active member of club or organization: Not on file    Attends meetings of clubs or organizations: Not on file    Relationship status: Not on file  . Intimate partner violence:    Fear of current or ex partner: Not on file    Emotionally abused: Not on file    Physically abused: Not on file    Forced sexual activity: Not on file  Other Topics Concern  . Not on file  Social History Narrative  . Not on file    Outpatient Encounter Medications as of 11/11/2018  Medication Sig  . acetaminophen (TYLENOL) 500 MG tablet Take 500 mg by mouth every 6 (six) hours as needed for mild pain.  [name] albuterol (PROVENTIL HFA;VENTOLIN HFA) 108 (90 Base) MCG/ACT inhaler Inhale 2 puffs into the lungs every 4 (four) hours as needed for wheezing or shortness of breath.  [name] amLODipine  (NORVASC) 2.5 MG tablet Take 1 tablet (2.5 mg total) by mouth daily.  . Biotin 10 MG CAPS Take 1 capsule by mouth daily.  . calcium-vitamin D (OSCAL WITH D) 500-200 MG-UNIT tablet Take 1 tablet by mouth daily.  [name] denosumab (PROLIA) 60 MG/ML SOSY injection Inject 60 mg into the skin every 6 (six) months.  . docusate sodium (COLACE) 100 MG capsule Take 100 mg by mouth daily as needed for mild constipation.  . ferrous sulfate 325 (65 FE) MG tablet Take 1 tablet (325 mg total) by mouth 2 (two) times daily with a meal.  . furosemide (LASIX) 40 MG tablet TAKE 1 & 1/2 (ONE & ONE-HALF) TABLETS BY MOUTH ONCE DAILY  . multivitamin-iron-minerals-folic acid (CENTRUM) chewable tablet Chew 1 tablet by mouth  daily.  . pantoprazole (PROTONIX) 40 MG tablet Take 1 tablet (40 mg total) by mouth daily at 6 (six) AM.  . [EXPIRED] denosumab (PROLIA) injection 60 mg    No facility-administered encounter medications on file as of 11/11/2018.     Allergies  Allergen Reactions  . Hctz [Hydrochlorothiazide] Other (See Comments)    Pt was ill and affected kidneys   . Codeine Other (See Comments)    Unknown  Made pt feel ill, has not had any problems since 1977    Review of Systems  Constitutional: Negative for activity change, appetite change, chills, diaphoresis, fatigue, fever and unexpected weight change.  HENT: Positive for hearing loss (chronic).   Eyes: Negative for photophobia and visual disturbance.  Respiratory: Positive for shortness of breath (exertional, longstanding issue). Negative for cough, chest tightness and wheezing.   Cardiovascular: Negative for chest pain, palpitations and leg swelling.  Gastrointestinal: Negative for abdominal distention, abdominal pain, anal bleeding, blood in stool, constipation, diarrhea, nausea and vomiting.  Endocrine: Negative for polydipsia, polyphagia and polyuria.  Genitourinary: Negative for decreased urine volume, difficulty urinating and hematuria.   Musculoskeletal: Negative for arthralgias and myalgias.  Neurological: Positive for weakness (generalized). Negative for dizziness, tremors, seizures, syncope, facial asymmetry, speech difficulty, light-headedness, numbness and headaches.  Hematological: Does not bruise/bleed easily.  Psychiatric/Behavioral: Negative for confusion.  All other systems reviewed and are negative.       Objective:  BP (!) 152/69 (BP Location: Left Arm)   Pulse 75   Temp 97.6 F (36.4 C) (Oral)   Ht '5\' 2"'$  (1.575 m)   Wt 111 lb (50.3 kg)   SpO2 97%   BMI 20.30 kg/m    Wt Readings from Last 3 Encounters:  11/11/18 111 lb (50.3 kg)  11/11/18 110 lb (49.9 kg)  08/22/18 113 lb (51.3 kg)    Physical Exam Vitals signs and nursing note reviewed.  Constitutional:      General: She is not in acute distress.    Appearance: Normal appearance. She is well-developed, well-groomed and normal weight. She is not ill-appearing or toxic-appearing.  HENT:     Head: Normocephalic and atraumatic.     Jaw: There is normal jaw occlusion.     Right Ear: Decreased hearing noted.     Left Ear: Decreased hearing noted.     Nose: Nose normal.     Mouth/Throat:     Mouth: Mucous membranes are moist.     Pharynx: Oropharynx is clear.  Eyes:     General: Lids are normal.     Conjunctiva/sclera: Conjunctivae normal.     Pupils: Pupils are equal, round, and reactive to light.  Neck:     Musculoskeletal: Full passive range of motion without pain and neck supple.     Thyroid: No thyroid mass, thyromegaly or thyroid tenderness.     Vascular: No carotid bruit or JVD.     Trachea: Trachea and phonation normal.  Cardiovascular:     Rate and Rhythm: Normal rate and regular rhythm.     Pulses: Normal pulses.     Heart sounds: Normal heart sounds. No murmur. No friction rub. No gallop.   Pulmonary:     Effort: Pulmonary effort is normal. No respiratory distress.     Breath sounds: Normal breath sounds.  Musculoskeletal:      Right lower leg: No edema.     Left lower leg: No edema.  Lymphadenopathy:     Cervical: No cervical adenopathy.  Skin:  General: Skin is warm and dry.     Capillary Refill: Capillary refill takes less than 2 seconds.     Coloration: Skin is not jaundiced or pale.     Findings: No bruising.  Neurological:     General: No focal deficit present.     Mental Status: She is alert and oriented to person, place, and time.     Gait: Gait abnormal (uses cane to ambulate).  Psychiatric:        Mood and Affect: Mood normal.        Behavior: Behavior normal. Behavior is cooperative.        Thought Content: Thought content normal.        Judgment: Judgment normal.     Results for orders placed or performed in visit on 09/04/18  CBC with Differential/Platelet  Result Value Ref Range   WBC 4.0 3.4 - 10.8 x10E3/uL   RBC 3.39 (L) 3.77 - 5.28 x10E6/uL   Hemoglobin 10.6 (L) 11.1 - 15.9 g/dL   Hematocrit 32.7 (L) 34.0 - 46.6 %   MCV 97 79 - 97 fL   MCH 31.3 26.6 - 33.0 pg   MCHC 32.4 31.5 - 35.7 g/dL   RDW 12.4 11.7 - 15.4 %   Platelets 210 150 - 450 x10E3/uL   Neutrophils 58 Not Estab. %   Lymphs 24 Not Estab. %   Monocytes 12 Not Estab. %   Eos 4 Not Estab. %   Basos 2 Not Estab. %   Neutrophils Absolute 2.3 1.4 - 7.0 x10E3/uL   Lymphocytes Absolute 1.0 0.7 - 3.1 x10E3/uL   Monocytes Absolute 0.5 0.1 - 0.9 x10E3/uL   EOS (ABSOLUTE) 0.2 0.0 - 0.4 x10E3/uL   Basophils Absolute 0.1 0.0 - 0.2 x10E3/uL   Immature Granulocytes 0 Not Estab. %   Immature Grans (Abs) 0.0 0.0 - 0.1 x10E3/uL       Pertinent labs & imaging results that were available during my care of the patient were reviewed by me and considered in my medical decision making.  Assessment & Plan:  Maeby was seen today for anemia, hyperlipidemia, hypothyroidism and hypertension.  Diagnoses and all orders for this visit:  Essential hypertension Stable. Continue current medications. Report any persistent high or low  readings. Medications as prescribed. Labs pending. No refills needed today. Follow up in 3 months. DASH diet discussed.  -     CMP14+EGFR -     TSH -     Lipid panel  Iron deficiency anemia, unspecified iron deficiency anemia type Has been off of Eliquis for several months due to GI bleeding. Pt denies hematochezia or melena. Pt states she is not longer short of breath. States she is gaining her strength back. If Hgb and Hct have returned to normal, will restart Eliquis per cardiology. If we restart Eliquis we will repeat CBC after 4 weeks. Pt will be notified of results.  -     CBC with Differential/Platelet  Hyponatremia Last sodium level normal. Pt does have slight generalized weakness, will recheck today. .  -     CMP14+EGFR  Age-related osteoporosis without current pathological fracture -     denosumab (PROLIA) injection 60 mg     Continue all other maintenance medications.  Follow up plan: Return in about 3 months (around 02/11/2019), or if symptoms worsen or fail to improve, for HTN.   The above assessment and management plan was discussed with the patient. The patient verbalized understanding of and has agreed to the  management plan. Patient is aware to call the clinic if symptoms persist or worsen. Patient is aware when to return to the clinic for a follow-up visit. Patient educated on when it is appropriate to go to the emergency department.   Monia Pouch, FNP-C [facility] [phone]

## 2018-11-11 NOTE — Telephone Encounter (Signed)
VERBAL CONSENT GIVEN TO TERESA FIELDS ON 11/10/2018 AT 9:54AM.      Virtual Visit Pre-Appointment Phone Call  "Crystal Cooper, I am calling you today to discuss your upcoming appointment. We are currently trying to limit exposure to the virus that causes COVID-19 by seeing patients at home rather than in the office."  1. "What is the BEST phone number to call the day of the visit?" - include this in appointment notes  2. "Do you have or have access to (through a family member/friend) a smartphone with video capability that we can use for your visit?" a. If yes - list this number in appt notes as "cell" (if different from BEST phone #) and list the appointment type as a VIDEO visit in appointment notes b. If no - list the appointment type as a PHONE visit in appointment notes  3. Confirm consent - "In the setting of the current Covid19 crisis, you are scheduled for a VIDEO visit with your provider on 11/11/2018 at 11AM.  Just as we do with many in-office visits, in order for you to participate in this visit, we must obtain consent.  If you'd like, I can send this to your mychart (if signed up) or email for you to review.  Otherwise, I can obtain your verbal consent now.  All virtual visits are billed to your insurance company just like a normal visit would be.  By agreeing to a virtual visit, we'd like you to understand that the technology does not allow for your provider to perform an examination, and thus may limit your provider's ability to fully assess your condition. If your provider identifies any concerns that need to be evaluated in person, we will make arrangements to do so.  Finally, though the technology is pretty good, we cannot assure that it will always work on either your or our end, and in the setting of a video visit, we may have to convert it to a phone-only visit.  In either situation, we cannot ensure that we have a secure connection.  Are you willing to proceed?" STAFF: Did the patient  verbally acknowledge consent to telehealth visit? Document YES/NO here: YES  4. Advise patient to be prepared - "Two hours prior to your appointment, go ahead and check your blood pressure, pulse, oxygen saturation, and your weight (if you have the equipment to check those) and write them all down. When your visit starts, your provider will ask you for this information. If you have an Apple Watch or Kardia device, please plan to have heart rate information ready on the day of your appointment. Please have a pen and paper handy nearby the day of the visit as well."  5. Give patient instructions for MyChart download to smartphone OR Doximity/Doxy.me as below if video visit (depending on what platform provider is using)  6. Inform patient they will receive a phone call 15 minutes prior to their appointment time (may be from unknown caller ID) so they should be prepared to answer    Crystal Cooper has been deemed a candidate for a follow-up tele-health visit to limit community exposure during the Covid-19 pandemic. I spoke with the patient via phone to ensure availability of phone/video source, confirm preferred email & phone number, and discuss instructions and expectations.  I reminded Crystal Cooper to be prepared with any vital sign and/or heart rhythm information that could potentially be obtained via home monitoring, at the time of her visit.  I reminded Crystal Cooper to expect a phone call prior to her visit.  Crystal Cooper, [place] 11/11/2018 10:29 AM   INSTRUCTIONS FOR DOWNLOADING THE MYCHART APP TO SMARTPHONE  - The patient must first make sure to have activated MyChart and know their login information - If Apple, go to [employer] and type in MyChart in the search bar and download the app. If Android, ask patient to go to [employer] and type in [place] in the search bar and download the app. The app is free but as with any other app downloads, their phone may  require them to verify saved payment information or Apple/Android password.  - The patient will need to then log into the app with their MyChart username and password, and select [facility] as their healthcare provider to link the account. When it is time for your visit, go to the MyChart app, find appointments, and click Begin Video Visit. Be sure to Select Allow for your device to access the Microphone and Camera for your visit. You will then be connected, and your provider will be with you shortly.  **If they have any issues connecting, or need assistance please contact MyChart service desk [phone])**  **If using a computer, in order to ensure the best quality for their visit they will need to use either of the following Internet Browsers: [employer], or Google Chrome**  IF USING DOXIMITY or DOXY.ME - The patient will receive a link just prior to their visit by text.     FULL LENGTH CONSENT FOR TELE-HEALTH VISIT   I hereby voluntarily request, consent and authorize [facility] and its employed or contracted physicians, physician assistants, nurse practitioners or other licensed health care professionals (the Practitioner), to provide me with telemedicine health care services (the "Services") as deemed necessary by the treating Practitioner. I acknowledge and consent to receive the Services by the Practitioner via telemedicine. I understand that the telemedicine visit will involve communicating with the Practitioner through live audiovisual communication technology and the disclosure of certain medical information by electronic transmission. I acknowledge that I have been given the opportunity to request an in-person assessment or other available alternative prior to the telemedicine visit and am voluntarily participating in the telemedicine visit.  I understand that I have the right to withhold or withdraw my consent to the use of telemedicine in the course of my care at  any time, without affecting my right to future care or treatment, and that the Practitioner or I may terminate the telemedicine visit at any time. I understand that I have the right to inspect all information obtained and/or recorded in the course of the telemedicine visit and may receive copies of available information for a reasonable fee.  I understand that some of the potential risks of receiving the Services via telemedicine include:  [name] Delay or interruption in medical evaluation due to technological equipment failure or disruption; . Information transmitted may not be sufficient (e.g. poor resolution of images) to allow for appropriate medical decision making by the Practitioner; and/or  . In rare instances, security protocols could fail, causing a breach of personal health information.  Furthermore, I acknowledge that it is my responsibility to provide information about my medical history, conditions and care that is complete and accurate to the best of my ability. I acknowledge that Practitioner's advice, recommendations, and/or decision may be based on factors not within their control, such as incomplete or inaccurate data provided by me or  distortions of diagnostic images or specimens that may result from electronic transmissions. I understand that the practice of medicine is not an exact science and that Practitioner makes no warranties or guarantees regarding treatment outcomes. I acknowledge that I will receive a copy of this consent concurrently upon execution via email to the email address I last provided but may also request a printed copy by calling the office of [facility].    I understand that my insurance will be billed for this visit.   I have read or had this consent read to me. . I understand the contents of this consent, which adequately explains the benefits and risks of the Services being provided via telemedicine.  . I have been provided ample opportunity to ask questions  regarding this consent and the Services and have had my questions answered to my satisfaction. . I give my informed consent for the services to be provided through the use of telemedicine in my medical care  By participating in this telemedicine visit I agree to the above.

## 2018-11-12 ENCOUNTER — Encounter: Payer: Self-pay | Admitting: Family Medicine

## 2018-11-12 ENCOUNTER — Ambulatory Visit (INDEPENDENT_AMBULATORY_CARE_PROVIDER_SITE_OTHER): Payer: Medicare Other | Admitting: *Deleted

## 2018-11-12 DIAGNOSIS — N183 Chronic kidney disease, stage 3 (moderate): Secondary | ICD-10-CM

## 2018-11-12 DIAGNOSIS — R001 Bradycardia, unspecified: Secondary | ICD-10-CM

## 2018-11-12 LAB — CBC WITH DIFFERENTIAL/PLATELET
Basophils Absolute: 0.1 10*3/uL (ref 0.0–0.2)
Basos: 2 %
EOS (ABSOLUTE): 0.3 10*3/uL (ref 0.0–0.4)
Eos: 6 %
Hematocrit: 36 % (ref 34.0–46.6)
Hemoglobin: 12.3 g/dL (ref 11.1–15.9)
Immature Grans (Abs): 0 10*3/uL (ref 0.0–0.1)
Immature Granulocytes: 0 %
Lymphocytes Absolute: 1.3 10*3/uL (ref 0.7–3.1)
Lymphs: 28 %
MCH: 31 pg (ref 26.6–33.0)
MCHC: 34.2 g/dL (ref 31.5–35.7)
MCV: 91 fL (ref 79–97)
Monocytes Absolute: 0.5 10*3/uL (ref 0.1–0.9)
Monocytes: 12 %
Neutrophils Absolute: 2.4 10*3/uL (ref 1.4–7.0)
Neutrophils: 52 %
Platelets: 193 10*3/uL (ref 150–450)
RBC: 3.97 x10E6/uL (ref 3.77–5.28)
RDW: 11.7 % (ref 11.7–15.4)
WBC: 4.5 10*3/uL (ref 3.4–10.8)

## 2018-11-12 LAB — TSH: TSH: 3.34 u[IU]/mL (ref 0.450–4.500)

## 2018-11-12 LAB — CMP14+EGFR
ALT: 21 IU/L (ref 0–32)
AST: 29 IU/L (ref 0–40)
Albumin/Globulin Ratio: 1.7 (ref 1.2–2.2)
Albumin: 4.5 g/dL (ref 3.6–4.6)
Alkaline Phosphatase: 75 IU/L (ref 39–117)
BUN/Creatinine Ratio: 15 (ref 12–28)
BUN: 18 mg/dL (ref 8–27)
Bilirubin Total: 0.4 mg/dL (ref 0.0–1.2)
CO2: 31 mmol/L — ABNORMAL HIGH (ref 20–29)
Calcium: 9.2 mg/dL (ref 8.7–10.3)
Chloride: 92 mmol/L — ABNORMAL LOW (ref 96–106)
Creatinine, Ser: 1.19 mg/dL — ABNORMAL HIGH (ref 0.57–1.00)
GFR calc Af Amer: 47 mL/min/{1.73_m2} — ABNORMAL LOW (ref >59)
GFR calc non Af Amer: 41 mL/min/{1.73_m2} — ABNORMAL LOW (ref >59)
Globulin, Total: 2.7 g/dL (ref 1.5–4.5)
Glucose: 113 mg/dL — ABNORMAL HIGH (ref 65–99)
Potassium: 3.8 mmol/L (ref 3.5–5.2)
Sodium: 136 mmol/L (ref 134–144)
Total Protein: 7.2 g/dL (ref 6.0–8.5)

## 2018-11-12 LAB — CUP PACEART REMOTE DEVICE CHECK
Battery Remaining Longevity: 86 mo
Battery Voltage: 3.01 V
Brady Statistic AP VP Percent: 4.79 %
Brady Statistic AP VS Percent: 11.78 %
Brady Statistic AS VP Percent: 5.9 %
Brady Statistic AS VS Percent: 77.53 %
Brady Statistic RA Percent Paced: 16.39 %
Brady Statistic RV Percent Paced: 11.03 %
Date Time Interrogation Session: 20200520121643
Implantable Lead Implant Date: 20170503
Implantable Lead Implant Date: 20170503
Implantable Lead Location: 753859
Implantable Lead Location: 753860
Implantable Lead Model: 5076
Implantable Lead Model: 5076
Implantable Pulse Generator Implant Date: 20170503
Lead Channel Impedance Value: 323 Ohm
Lead Channel Impedance Value: 361 Ohm
Lead Channel Impedance Value: 437 Ohm
Lead Channel Impedance Value: 437 Ohm
Lead Channel Pacing Threshold Amplitude: 0.875 V
Lead Channel Pacing Threshold Amplitude: 1 V
Lead Channel Pacing Threshold Pulse Width: 0.4 ms
Lead Channel Pacing Threshold Pulse Width: 0.4 ms
Lead Channel Sensing Intrinsic Amplitude: 10.625 mV
Lead Channel Sensing Intrinsic Amplitude: 10.625 mV
Lead Channel Sensing Intrinsic Amplitude: 3.875 mV
Lead Channel Sensing Intrinsic Amplitude: 3.875 mV
Lead Channel Setting Pacing Amplitude: 2 V
Lead Channel Setting Pacing Amplitude: 2.5 V
Lead Channel Setting Pacing Pulse Width: 0.4 ms
Lead Channel Setting Sensing Sensitivity: 2.8 mV

## 2018-11-12 LAB — LIPID PANEL
Chol/HDL Ratio: 2.1 ratio (ref 0.0–4.4)
Cholesterol, Total: 146 mg/dL (ref 100–199)
HDL: 70 mg/dL (ref >39)
LDL Calculated: 50 mg/dL (ref 0–99)
Triglycerides: 128 mg/dL (ref 0–149)
VLDL Cholesterol Cal: 26 mg/dL (ref 5–40)

## 2018-11-12 MED ORDER — ENALAPRIL MALEATE 2.5 MG PO TABS: 2.5000 mg | ORAL_TABLET | Freq: Every day | ORAL | 3 refills | Status: DC

## 2018-11-12 NOTE — Addendum Note (Signed)
Addended by: Baruch Gouty on: 11/12/2018 09:16 AM   Modules accepted: Orders

## 2018-11-13 ENCOUNTER — Ambulatory Visit: Payer: Medicare Other | Admitting: Cardiology

## 2018-11-13 ENCOUNTER — Telehealth: Payer: Self-pay | Admitting: Family Medicine

## 2018-11-13 NOTE — Telephone Encounter (Signed)
Yes, I sent the info about the medication with her labs. Her renal function continues to decline and she needs this very low dose for renal protection.

## 2018-11-13 NOTE — Telephone Encounter (Signed)
Pt States that she wasn't aware of thsi med being sent in - I told her that it was a low dose Bp med that can also be used for kidney protection - but she wanted [facility] response and also wanted to make sure we know "that her BP was taken after she rushed down the hall"

## 2018-11-18 ENCOUNTER — Telehealth: Payer: Self-pay | Admitting: Family Medicine

## 2018-11-18 NOTE — Telephone Encounter (Signed)
The enalapril is not for her HTN. It is for her declining kidney function and the Norvasc does not help that.

## 2018-11-18 NOTE — Telephone Encounter (Signed)
Patient is wanting to know if she really needs to start the enalapril or could she just go back up on amlodipine 5mg ?

## 2018-11-18 NOTE — Telephone Encounter (Signed)
Patient aware of recommendation.  

## 2018-11-19 NOTE — Telephone Encounter (Signed)
LM - detailed with info from [facility] = also aware to call with any questions.

## 2018-11-21 ENCOUNTER — Telehealth: Payer: Self-pay | Admitting: *Deleted

## 2018-11-21 ENCOUNTER — Encounter: Payer: Self-pay | Admitting: Cardiology

## 2018-11-21 MED ORDER — METOPROLOL TARTRATE 25 MG PO TABS: 25.0000 mg | ORAL_TABLET | Freq: Two times a day (BID) | ORAL | 3 refills | Status: DC

## 2018-11-21 NOTE — Telephone Encounter (Signed)
Spoke to pt dtr, [name]. Advised of transmission findings and to start Lopressor 25 mg BID.  Agreeable to plan. Rx sent to [facility].

## 2018-11-21 NOTE — Progress Notes (Signed)
Remote pacemaker transmission.   

## 2018-11-21 NOTE — Telephone Encounter (Signed)
-----   Message from Lelon Perla, MD sent at 11/20/2018  4:30 PM EDT ----- Resume apixaban 2.5 mg BID and DC ASA; recheck hgb 4 weeks [name] ----- Message ----- From: Cristopher Estimable, RN Sent: 11/20/2018   3:42 PM EDT To: Lelon Perla, MD  What about xarelto? ----- Message ----- From: Lelon Perla, MD Sent: 11/20/2018   2:31 PM EDT To: Cristopher Estimable, RN  Hgb stable [name] ----- Message ----- From: Cristopher Estimable, RN Sent: 11/20/2018   2:05 PM EDT To: Lelon Perla, MD  Labs cbc available in chart

## 2018-11-21 NOTE — Telephone Encounter (Signed)
Spoke to dtr, [place]. She reports pt has already re-started Apixaban 2.5 mg BID, per PCP, and pt isn't taking ASA. Pt is scheduled for f/u Hgb on 6/22 w/ PCP. Forwarding to Katherine, Fredia Beets, for her Juluis Rainier.

## 2018-11-21 NOTE — Telephone Encounter (Signed)
-----   Message from Will Meredith Leeds, MD sent at 11/20/2018 10:52 AM EDT ----- Abnormal device interrogation reviewed.  Lead parameters and battery status stable.  NSVT noted. Start metoprolol 25 mg BID.

## 2018-11-21 NOTE — Telephone Encounter (Signed)
lmtcb

## 2018-12-15 ENCOUNTER — Ambulatory Visit: Payer: Medicare Other | Admitting: Family Medicine

## 2018-12-15 ENCOUNTER — Other Ambulatory Visit: Payer: Self-pay

## 2018-12-16 ENCOUNTER — Ambulatory Visit: Payer: Medicare Other | Admitting: Family Medicine

## 2018-12-17 ENCOUNTER — Other Ambulatory Visit: Payer: Self-pay

## 2018-12-18 ENCOUNTER — Ambulatory Visit: Payer: Medicare Other | Admitting: Family Medicine

## 2018-12-19 ENCOUNTER — Other Ambulatory Visit: Payer: Self-pay

## 2018-12-22 ENCOUNTER — Ambulatory Visit (INDEPENDENT_AMBULATORY_CARE_PROVIDER_SITE_OTHER): Payer: Medicare Other | Admitting: Family Medicine

## 2018-12-22 ENCOUNTER — Encounter: Payer: Self-pay | Admitting: Family Medicine

## 2018-12-22 ENCOUNTER — Other Ambulatory Visit: Payer: Self-pay

## 2018-12-22 VITALS — BP 147/66 | HR 60 | Temp 96.7°F | Ht 62.0 in | Wt 112.0 lb

## 2018-12-22 DIAGNOSIS — I10 Essential (primary) hypertension: Secondary | ICD-10-CM | POA: Diagnosis not present

## 2018-12-22 DIAGNOSIS — K264 Chronic or unspecified duodenal ulcer with hemorrhage: Secondary | ICD-10-CM | POA: Diagnosis not present

## 2018-12-22 DIAGNOSIS — I5032 Chronic diastolic (congestive) heart failure: Secondary | ICD-10-CM

## 2018-12-22 DIAGNOSIS — D509 Iron deficiency anemia, unspecified: Secondary | ICD-10-CM

## 2018-12-22 LAB — CMP14+EGFR
ALT: 14 IU/L (ref 0–32)
AST: 23 IU/L (ref 0–40)
Albumin/Globulin Ratio: 1.8 (ref 1.2–2.2)
Albumin: 4.3 g/dL (ref 3.6–4.6)
Alkaline Phosphatase: 55 IU/L (ref 39–117)
BUN/Creatinine Ratio: 14 (ref 12–28)
BUN: 17 mg/dL (ref 8–27)
Bilirubin Total: 0.6 mg/dL (ref 0.0–1.2)
CO2: 26 mmol/L (ref 20–29)
Calcium: 9.3 mg/dL (ref 8.7–10.3)
Chloride: 96 mmol/L (ref 96–106)
Creatinine, Ser: 1.19 mg/dL — ABNORMAL HIGH (ref 0.57–1.00)
GFR calc Af Amer: 47 mL/min/{1.73_m2} — ABNORMAL LOW (ref >59)
GFR calc non Af Amer: 41 mL/min/{1.73_m2} — ABNORMAL LOW (ref >59)
Globulin, Total: 2.4 g/dL (ref 1.5–4.5)
Glucose: 86 mg/dL (ref 65–99)
Potassium: 4.7 mmol/L (ref 3.5–5.2)
Sodium: 134 mmol/L (ref 134–144)
Total Protein: 6.7 g/dL (ref 6.0–8.5)

## 2018-12-22 LAB — CBC WITH DIFFERENTIAL/PLATELET
Basophils Absolute: 0.1 10*3/uL (ref 0.0–0.2)
Basos: 2 %
EOS (ABSOLUTE): 0.2 10*3/uL (ref 0.0–0.4)
Eos: 5 %
Hematocrit: 32.1 % — ABNORMAL LOW (ref 34.0–46.6)
Hemoglobin: 10.8 g/dL — ABNORMAL LOW (ref 11.1–15.9)
Immature Grans (Abs): 0 10*3/uL (ref 0.0–0.1)
Immature Granulocytes: 0 %
Lymphocytes Absolute: 0.9 10*3/uL (ref 0.7–3.1)
Lymphs: 24 %
MCH: 31.2 pg (ref 26.6–33.0)
MCHC: 33.6 g/dL (ref 31.5–35.7)
MCV: 93 fL (ref 79–97)
Monocytes Absolute: 0.4 10*3/uL (ref 0.1–0.9)
Monocytes: 12 %
Neutrophils Absolute: 2 10*3/uL (ref 1.4–7.0)
Neutrophils: 57 %
Platelets: 198 10*3/uL (ref 150–450)
RBC: 3.46 x10E6/uL — ABNORMAL LOW (ref 3.77–5.28)
RDW: 13.1 % (ref 11.7–15.4)
WBC: 3.5 10*3/uL (ref 3.4–10.8)

## 2018-12-22 MED ORDER — FUROSEMIDE 40 MG PO TABS: 60.0000 mg | ORAL_TABLET | Freq: Every day | ORAL | 3 refills | Status: DC

## 2018-12-22 NOTE — Patient Instructions (Addendum)
It was a pleasure seeing you today, Tashiya.  Information regarding what we discussed is included in this packet.  Please make an appointment to see me in 3 months.   In a few days you may receive a survey in the mail or online from [employer] regarding your visit with [place] today. Please take a moment to fill this out. Your feedback is very important to our office. It can help [place] better understand your needs as well as improve your experience and satisfaction. Thank you for taking your time to complete it. We care about you.  Because of recent events of COVID-19 ("Coronavirus"), please follow CDC recommendations:   1. Wash your hand frequently 2. Avoid touching your face 3. Stay away from people who are sick 4. If you have symptoms such as fever, cough, shortness of breath then call your healthcare provider for further guidance 5. If you are sick, STAY AT HOME, unless otherwise directed by your healthcare provider. 6. Follow directions from state and national officials regarding staying safe    Please feel free to call our office if any questions or concerns arise.  Warm Regards, Monia Pouch, FNP-C [facility] [address] [place], [place] [address] [phone]      survey

## 2018-12-22 NOTE — Progress Notes (Signed)
Subjective:  Patient ID: Crystal Cooper, female    DOB: Jul 09, 1926, 85 y.o.   MRN: [id number]  Chief Complaint:  Hypertension (5-6 week follow up )   HPI: Crystal Cooper is a 85 y.o. female presenting on 12/22/2018 for Hypertension (5-6 week follow up )   1. Essential hypertension  Complaint with meds - Yes Checking BP at home - No Exercising Regularly - No Watching Salt intake - Yes Pertinent ROS:  Headache - No Chest pain - No Dyspnea - No Palpitations - No LE edema - Yes They report good compliance with medications and can restate their regimen by memory. No medication side effects.  BP Readings from Last 3 Encounters:  12/22/18 (!) 147/66  11/11/18 (!) 152/69  08/22/18 118/62     2. Iron deficiency anemia, unspecified iron deficiency anemia type  Pt was restarted on Eliquis 4 weeks ago after Hgb returned to normal. Pt is compliant with iron repletion and Eliquis therapy. She denies dark, tarry stools. No melena or hematochezia. No abdominal pain, weakness, fatigue, palpitations, dizziness or shortness of breath.    3. Chronic diastolic congestive heart failure ([place])  Ongoing. Followed by cardiology. States she is out of her furosemide. States she has noticed an increase in swelling in her feet and ankles for the last 2 days. No fatigue, syncope, dyspnea, PND, or orthopnea.      Relevant past medical, surgical, family, and social history reviewed and updated as indicated.  Allergies and medications reviewed and updated.   Past Medical History:  Diagnosis Date  . Anemia    years ago  . Aortic stenosis   . Arthritis   . Asthma   . Atrial fibrillation ([place])   . CHF (congestive heart failure) ([place]) 11/2014  . Family history of adverse reaction to anesthesia    2 daughters would have N/V  . Glaucoma   . Hearing loss   . Heart murmur   . HTN (hypertension)   . Pneumonia   . Prolapsing mitral leaflet syndrome   . Shortness of breath dyspnea    with exertion   . SVT (supraventricular tachycardia) ([facility])    S/P ablation of AVNRT in 2003    Past Surgical History:  Procedure Laterality Date  . APPENDECTOMY    . BIOPSY  04/07/2018   Procedure: BIOPSY;  Surgeon: Jerene Bears, MD;  Location: [facility] ENDOSCOPY;  Service: Gastroenterology;;  . BLADDER SURGERY    . CARDIAC CATHETERIZATION N/A 09/26/2015   Procedure: Right/Left Heart Cath and Coronary Angiography;  Surgeon: Burnell Blanks, MD;  Location: [place] CV LAB;  Service: Cardiovascular;  Laterality: N/A;  . CARDIAC SURGERY    . CARDIOVERSION N/A 03/02/2016   Procedure: CARDIOVERSION;  Surgeon: Thayer Headings, MD;  Location: [place];  Service: Cardiovascular;  Laterality: N/A;  . EP IMPLANTABLE DEVICE N/A 10/26/2015   Procedure: Pacemaker Implant;  Surgeon: Will Meredith Leeds, MD;  Location: [place] CV LAB;  Service: Cardiovascular;  Laterality: N/A;  . ESOPHAGOGASTRODUODENOSCOPY (EGD) WITH PROPOFOL N/A 04/07/2018   Procedure: ESOPHAGOGASTRODUODENOSCOPY (EGD) WITH PROPOFOL;  Surgeon: Jerene Bears, MD;  Location: [facility] ENDOSCOPY;  Service: Gastroenterology;  Laterality: N/A;  . EYE SURGERY Bilateral    cataract surgery  . HOT HEMOSTASIS N/A 04/07/2018   Procedure: HOT HEMOSTASIS (ARGON PLASMA COAGULATION/BICAP);  Surgeon: Jerene Bears, MD;  Location: [facility] ENDOSCOPY;  Service: Gastroenterology;  Laterality: N/A;  . NASAL SINUS SURGERY    . TEE WITHOUT CARDIOVERSION N/A  10/25/2015   Procedure: TRANSESOPHAGEAL ECHOCARDIOGRAM (TEE);  Surgeon: Burnell Blanks, MD;  Location: [place];  Service: Open Heart Surgery;  Laterality: N/A;  . TRANSCATHETER AORTIC VALVE REPLACEMENT, TRANSFEMORAL Right 10/25/2015   Procedure: TRANSCATHETER AORTIC VALVE REPLACEMENT, TRANSFEMORAL;  Surgeon: Burn[place] Blanks, MD;  Location: [place];  Service: Open Heart Surgery;  Laterality: Right;    Social History   Socioeconomic History  . Marital status: Widowed    Spouse name: Not on file  . Number of  children: 3  . Years of education: Not on file  . Highest education level: Not on file  Occupational History  . Not on file  Social Needs  . Financial resource strain: Not on file  . Food insecurity    Worry: Not on file    Inability: Not on file  . Transportation needs    Medical: Not on file    Non-medical: Not on file  Tobacco Use  . Smoking status: Never Smoker  . Smokeless tobacco: Never Used  Substance and Sexual Activity  . Alcohol use: No    Alcohol/week: 0.0 standard drinks  . Drug use: No  . Sexual activity: Never  Lifestyle  . Physical activity    Days per week: Not on file    Minutes per session: Not on file  . Stress: Not on file  Relationships  . Social Herbalist on phone: Not on file    Gets together: Not on file    Attends religious service: Not on file    Active member of club or organization: Not on file    Attends meetings of clubs or organizations: Not on file    Relationship status: Not on file  . Intimate partner violence    Fear of current or ex partner: Not on file    Emotionally abused: Not on file    Physically abused: Not on file    Forced sexual activity: Not on file  Other Topics Concern  . Not on file  Social History Narrative  . Not on file    Outpatient Encounter Medications as of 12/22/2018  Medication Sig  . acetaminophen (TYLENOL) 500 MG tablet Take 500 mg by mouth every 6 (six) hours as needed for mild pain.  [name] albuterol (PROVENTIL HFA;VENTOLIN HFA) 108 (90 Base) MCG/ACT inhaler Inhale 2 puffs into the lungs every 4 (four) hours as needed for wheezing or shortness of breath.  [name] amLODipine (NORVASC) 2.5 MG tablet Take 1 tablet (2.5 mg total) by mouth daily.  [name] Apixaban (ELIQUIS PO) Take 2.5 mg by mouth daily. Sample  . Biotin 10 MG CAPS Take 1 capsule by mouth daily.  . calcium-vitamin D (OSCAL WITH D) 500-200 MG-UNIT tablet Take 1 tablet by mouth daily.  [name] denosumab (PROLIA) 60 MG/ML SOSY injection Inject 60 mg into  the skin every 6 (six) months.  . docusate sodium (COLACE) 100 MG capsule Take 100 mg by mouth daily as needed for mild constipation.  . ferrous sulfate 325 (65 FE) MG tablet Take 1 tablet (325 mg total) by mouth 2 (two) times daily with a meal.  . furosemide (LASIX) 40 MG tablet Take 1.5 tablets (60 mg total) by mouth daily.  . metoprolol tartrate (LOPRESSOR) 25 MG tablet Take 1 tablet (25 mg total) by mouth 2 (two) times daily.  . multivitamin-iron-minerals-folic acid (CENTRUM) chewable tablet Chew 1 tablet by mouth daily.  . pantoprazole (PROTONIX) 40 MG tablet Take 1 tablet (40 mg total) by mouth  daily at 6 (six) AM.  . [DISCONTINUED] furosemide (LASIX) 40 MG tablet TAKE 1 & 1/2 (ONE & ONE-HALF) TABLETS BY MOUTH ONCE DAILY  . enalapril (VASOTEC) 2.5 MG tablet Take 1 tablet (2.5 mg total) by mouth daily for 30 days.   No facility-administered encounter medications on file as of 12/22/2018.     Allergies  Allergen Reactions  . Hctz [Hydrochlorothiazide] Other (See Comments)    Pt was ill and affected kidneys   . Codeine Other (See Comments)    Unknown  Made pt feel ill, has not had any problems since 1977    Review of Systems  Constitutional: Negative for chills, fatigue, fever and unexpected weight change.  Eyes: Negative for photophobia and visual disturbance.  Respiratory: Negative for cough, chest tightness and shortness of breath.   Cardiovascular: Positive for leg swelling. Negative for chest pain and palpitations.  Gastrointestinal: Negative for abdominal pain, anal bleeding and blood in stool.  Genitourinary: Negative for decreased urine volume and difficulty urinating.  Musculoskeletal: Negative for arthralgias and myalgias.  Skin: Negative for color change and pallor.  Neurological: Negative for dizziness, tremors, seizures, syncope, facial asymmetry, speech difficulty, weakness, light-headedness, numbness and headaches.  Hematological: Does not bruise/bleed easily.   Psychiatric/Behavioral: Negative for confusion.  All other systems reviewed and are negative.       Objective:  BP (!) 147/66   Pulse 60   Temp (!) 96.7 F (35.9 C) (Oral)   Ht 5' 2" (1.575 m)   Wt 112 lb (50.8 kg)   BMI 20.49 kg/m    Wt Readings from Last 3 Encounters:  12/22/18 112 lb (50.8 kg)  11/11/18 111 lb (50.3 kg)  11/11/18 110 lb (49.9 kg)    Physical Exam Vitals signs and nursing note reviewed.  Constitutional:      General: She is not in acute distress.    Appearance: Normal appearance. She is well-developed and well-groomed. She is not ill-appearing, toxic-appearing or diaphoretic.  HENT:     Head: Normocephalic and atraumatic.     Jaw: There is normal jaw occlusion.     Right Ear: Hearing normal.     Left Ear: Hearing normal.     Nose: Nose normal.     Mouth/Throat:     Lips: Pink.     Mouth: Mucous membranes are moist.     Pharynx: Oropharynx is clear. Uvula midline.  Eyes:     General: Lids are normal.     Extraocular Movements: Extraocular movements intact.     Conjunctiva/sclera: Conjunctivae normal.     Pupils: Pupils are equal, round, and reactive to light.  Neck:     Musculoskeletal: Normal range of motion and neck supple.     Thyroid: No thyroid mass, thyromegaly or thyroid tenderness.     Vascular: No carotid bruit or JVD.     Trachea: Trachea and phonation normal.  Cardiovascular:     Rate and Rhythm: Normal rate and regular rhythm.     Chest Wall: PMI is not displaced.     Pulses: Normal pulses.     Heart sounds: Normal heart sounds. No murmur. No friction rub. No gallop.   Pulmonary:     Effort: Pulmonary effort is normal. No respiratory distress.     Breath sounds: Normal breath sounds. No wheezing.  Abdominal:     General: Bowel sounds are normal. There is no distension or abdominal bruit.     Palpations: Abdomen is soft. There is no hepatomegaly or splenomegaly.  Tenderness: There is no abdominal tenderness. There is no right  CVA tenderness or left CVA tenderness.     Hernia: No hernia is present.  Musculoskeletal: Normal range of motion.     Right lower leg: 1+ Edema present.     Left lower leg: 1+ Edema present.  Lymphadenopathy:     Cervical: No cervical adenopathy.  Skin:    General: Skin is warm and dry.     Capillary Refill: Capillary refill takes less than 2 seconds.     Coloration: Skin is not cyanotic, jaundiced or pale.     Findings: No rash.  Neurological:     General: No focal deficit present.     Mental Status: She is alert and oriented to person, place, and time.     Cranial Nerves: Cranial nerves are intact.     Sensory: Sensation is intact.     Motor: Motor function is intact.     Coordination: Coordination is intact.     Gait: Gait is intact.     Deep Tendon Reflexes: Reflexes are normal and symmetric.  Psychiatric:        Attention and Perception: Attention and perception normal.        Mood and Affect: Mood and affect normal.        Speech: Speech normal.        Behavior: Behavior normal. Behavior is cooperative.        Thought Content: Thought content normal.        Cognition and Memory: Cognition and memory normal.        Judgment: Judgment normal.     Results for orders placed or performed in visit on 11/12/18  CUP PACEART REMOTE DEVICE CHECK  Result Value Ref Range   Date Time Interrogation Session [phone]    Pulse Generator Manufacturer [employer]M    Pulse Gen Model A2DR01 Advisa DR MRI    Pulse Gen Serial Number [id number]    Clinic Name [facility] Pulse Generator Type Implantable Pulse Generator    Implantable Pulse Generator Implant Date [id number]    Implantable Lead Manufacturer [employer]M    Implantable Lead Model 5076 CapSureFix Novus MRI SureScan    Implantable Lead Serial Number [id number]    Implantable Lead Implant Date 16010932    Implantable Lead Location Detail 1 UNKNOWN    Implantable Lead Location [id number]    Implantable Lead Manufacturer  MERM    Implantable Lead Model 5076 CapSureFix Novus MRI SureScan    Implantable Lead Serial Number [id number]    Implantable Lead Implant Date 42706237    Implantable Lead Location Detail 1 UNKNOWN    Implantable Lead Location [id number]    Lead Channel Setting Sensing Sensitivity 2.8 mV   Lead Channel Setting Pacing Amplitude 2 V   Lead Channel Setting Pacing Pulse Width 0.4 ms   Lead Channel Setting Pacing Amplitude 2.5 V   Lead Channel Impedance Value 437 ohm   Lead Channel Impedance Value 323 ohm   Lead Channel Sensing Intrinsic Amplitude 3.875 mV   Lead Channel Sensing Intrinsic Amplitude 3.875 mV   Lead Channel Pacing Threshold Amplitude 0.875 V   Lead Channel Pacing Threshold Pulse Width 0.4 ms   Lead Channel Impedance Value 437 ohm   Lead Channel Impedance Value 361 ohm   Lead Channel Sensing Intrinsic Amplitude 10.625 mV   Lead Channel Sensing Intrinsic Amplitude 10.625 mV   Lead Channel Pacing Threshold Amplitude 1 V   Lead Channel Pacing Threshold  Pulse Width 0.4 ms   Battery Status OK    Battery Remaining Longevity 86 mo   Battery Voltage 3.01 V   Brady Statistic RA Percent Paced 16.39 %   Brady Statistic RV Percent Paced 11.03 %   Brady Statistic AP VP Percent 4.79 %   Brady Statistic AS VP Percent 5.9 %   Brady Statistic AP VS Percent 11.78 %   Brady Statistic AS VS Percent 77.53 %       Pertinent labs & imaging results that were available during my care of the patient were reviewed by me and considered in my medical decision making.  Assessment & Plan:  Minnie was seen today for hypertension.  Diagnoses and all orders for this visit:  Essential hypertension DASH diet discussed. Continue medications as prescribed. Labs pending. Report any persistent high or low readings.  -     CBC with Differential/Platelet -     CMP14+EGFR  Iron deficiency anemia, unspecified iron deficiency anemia type Continue repletion therapy. Will check labs today. Will copy Dr.  Stanford Breed and Dr. Curt Bears with lab results per pt request.  -     CBC with Differential/Platelet -     ZOX09+UEAV  Chronic diastolic congestive heart failure ([place]) Has been out of furosemide for two days. Does have 1+ nonpitting edema in bilateral ankles. No PND, orthopnea, or dyspnea. No weight gain of greater than 3 lbs in one day of 5 lbs in one week. Pt aware to report weight changes or new or worsening symptoms. Keep follow up with cardiology.  -     furosemide (LASIX) 40 MG tablet; Take 1.5 tablets (60 mg total) by mouth daily.     Continue all other maintenance medications.  Follow up plan: Return in about 3 months (around 03/24/2019), or if symptoms worsen or fail to improve, for HTN.  Educational handout given for survey  The above assessment and management plan was discussed with the patient. The patient verbalized understanding of and has agreed to the management plan. Patient is aware to call the clinic if symptoms persist or worsen. Patient is aware when to return to the clinic for a follow-up visit. Patient educated on when it is appropriate to go to the emergency department.   Monia Pouch, FNP-C [facility] [phone]

## 2018-12-23 ENCOUNTER — Other Ambulatory Visit: Payer: Self-pay | Admitting: *Deleted

## 2018-12-23 NOTE — Addendum Note (Signed)
Addended by: Baruch Gouty on: 12/23/2018 08:04 AM   Modules accepted: Orders

## 2018-12-30 ENCOUNTER — Other Ambulatory Visit: Payer: Self-pay

## 2018-12-30 ENCOUNTER — Other Ambulatory Visit: Payer: Medicare Other

## 2018-12-30 DIAGNOSIS — D649 Anemia, unspecified: Secondary | ICD-10-CM | POA: Diagnosis not present

## 2018-12-31 LAB — CBC WITH DIFFERENTIAL/PLATELET
Basophils Absolute: 0.1 10*3/uL (ref 0.0–0.2)
Basos: 2 %
EOS (ABSOLUTE): 0.4 10*3/uL (ref 0.0–0.4)
Eos: 8 %
Hematocrit: 31.7 % — ABNORMAL LOW (ref 34.0–46.6)
Hemoglobin: 10.6 g/dL — ABNORMAL LOW (ref 11.1–15.9)
Immature Grans (Abs): 0 10*3/uL (ref 0.0–0.1)
Immature Granulocytes: 0 %
Lymphocytes Absolute: 1.2 10*3/uL (ref 0.7–3.1)
Lymphs: 27 %
MCH: 31.5 pg (ref 26.6–33.0)
MCHC: 33.4 g/dL (ref 31.5–35.7)
MCV: 94 fL (ref 79–97)
Monocytes Absolute: 0.6 10*3/uL (ref 0.1–0.9)
Monocytes: 12 %
Neutrophils Absolute: 2.4 10*3/uL (ref 1.4–7.0)
Neutrophils: 51 %
Platelets: 192 10*3/uL (ref 150–450)
RBC: 3.36 x10E6/uL — ABNORMAL LOW (ref 3.77–5.28)
RDW: 13.1 % (ref 11.7–15.4)
WBC: 4.6 10*3/uL (ref 3.4–10.8)

## 2019-01-05 ENCOUNTER — Other Ambulatory Visit: Payer: Self-pay | Admitting: Family Medicine

## 2019-01-05 DIAGNOSIS — D509 Iron deficiency anemia, unspecified: Secondary | ICD-10-CM

## 2019-01-05 DIAGNOSIS — K264 Chronic or unspecified duodenal ulcer with hemorrhage: Secondary | ICD-10-CM

## 2019-01-06 ENCOUNTER — Telehealth: Payer: Self-pay | Admitting: Family Medicine

## 2019-01-06 NOTE — Telephone Encounter (Signed)
Patient aware of lab results and recommendations. 

## 2019-01-22 ENCOUNTER — Telehealth: Payer: Self-pay | Admitting: Family Medicine

## 2019-01-22 NOTE — Telephone Encounter (Signed)
Pt daughter called - aware of last labs. Appt made for follow up here .  Has cardio appt 02/24/19. Gi has not been set up at this time.   I will call them today and try to get this set up asap.

## 2019-01-22 NOTE — Telephone Encounter (Signed)
GI called and appt set.  Daughter aware

## 2019-01-28 ENCOUNTER — Other Ambulatory Visit: Payer: Self-pay

## 2019-01-29 ENCOUNTER — Encounter: Payer: Self-pay | Admitting: Family Medicine

## 2019-01-29 ENCOUNTER — Ambulatory Visit (INDEPENDENT_AMBULATORY_CARE_PROVIDER_SITE_OTHER): Payer: Medicare Other | Admitting: Family Medicine

## 2019-01-29 VITALS — BP 148/77 | HR 62 | Temp 97.2°F | Ht 62.0 in | Wt 113.0 lb

## 2019-01-29 DIAGNOSIS — R6889 Other general symptoms and signs: Secondary | ICD-10-CM | POA: Diagnosis not present

## 2019-01-29 DIAGNOSIS — D509 Iron deficiency anemia, unspecified: Secondary | ICD-10-CM

## 2019-01-29 MED ORDER — ZOSTER VAC RECOMB ADJUVANTED 50 MCG/0.5ML IM SUSR: 0.5000 mL | Freq: Once | INTRAMUSCULAR | 0 refills | Status: AC

## 2019-01-29 NOTE — Progress Notes (Signed)
Subjective:  Patient ID: Crystal Cooper, female    DOB: Dec 17, 1926, 85 y.o.   MRN: [id number]  Patient Care Team: Baruch Gouty, FNP as PCP - General (Family Medicine) Ander Slade, Carlisle Beers, MD as Referring Physician (Ophthalmology) Stanford Breed Denice Bors, MD as Consulting Physician (Cardiology) Constance Haw, MD as Consulting Physician (Cardiology)   Chief Complaint:  Hypertension (4 week follow up and LABS )   HPI: Crystal Cooper is a 85 y.o. female presenting on 01/29/2019 for Hypertension (4 week follow up and LABS )   Pt following up today for anemia associated with Eliquis use. Pt has been off of Eliquis for 4 weeks. Pt states she has been taking iron as prescribed and eating iron rich foods. States she has gained some strength back. Pt denies melena or hematochezia. No shortness of breath, dizziness, chest pain, or syncope.   Hypertension This is a chronic problem. The current episode started more than 1 year ago. The problem is unchanged. The problem is controlled. Associated symptoms include palpitations. Pertinent negatives include no anxiety, blurred vision, chest pain, headaches, malaise/fatigue, neck pain, orthopnea, peripheral edema, PND, shortness of breath or sweats. Past treatments include beta blockers, ACE inhibitors and calcium channel blockers. The current treatment provides significant improvement. There are no compliance problems.      Relevant past medical, surgical, family, and social history reviewed and updated as indicated.  Allergies and medications reviewed and updated. Date reviewed: Chart in Epic.   Past Medical History:  Diagnosis Date  . Anemia    years ago  . Aortic stenosis   . Arthritis   . Asthma   . Atrial fibrillation ([place])   . CHF (congestive heart failure) ([place]) 11/2014  . Family history of adverse reaction to anesthesia    2 daughters would have N/V  . Glaucoma   . Hearing loss   . Heart murmur   . HTN (hypertension)   .  Pneumonia   . Prolapsing mitral leaflet syndrome   . Shortness of breath dyspnea    with exertion  . SVT (supraventricular tachycardia) ([facility])    S/P ablation of AVNRT in 2003    Past Surgical History:  Procedure Laterality Date  . APPENDECTOMY    . BIOPSY  04/07/2018   Procedure: BIOPSY;  Surgeon: Jerene Bears, MD;  Location: [facility] ENDOSCOPY;  Service: Gastroenterology;;  . BLADDER SURGERY    . CARDIAC CATHETERIZATION N/A 09/26/2015   Procedure: Right/Left Heart Cath and Coronary Angiography;  Surgeon: Burnell Blanks, MD;  Location: [facility];  Service: Cardiovascular;  Laterality: N/A;  . CARDIAC SURGERY    . CARDIOVERSION N/A 03/02/2016   Procedure: CARDIOVERSION;  Surgeon: Thayer Headings, MD;  Location: [facility];  Service: Cardiovascular;  Laterality: N/A;  . EP IMPLANTABLE DEVICE N/A 10/26/2015   Procedure: Pacemaker Implant;  Surgeon: Will Meredith Leeds, MD;  Location: [facility];  Service: Cardiovascular;  Laterality: N/A;  . ESOPHAGOGASTRODUODENOSCOPY (EGD) WITH PROPOFOL N/A 04/07/2018   Procedure: ESOPHAGOGASTRODUODENOSCOPY (EGD) WITH PROPOFOL;  Surgeon: Jerene Bears, MD;  Location: [facility] ENDOSCOPY;  Service: Gastroenterology;  Laterality: N/A;  . EYE SURGERY Bilateral    cataract surgery  . HOT HEMOSTASIS N/A 04/07/2018   Procedure: HOT HEMOSTASIS (ARGON PLASMA COAGULATION/BICAP);  Surgeon: Jerene Bears, MD;  Location: [facility] ENDOSCOPY;  Service: Gastroenterology;  Laterality: N/A;  . NASAL SINUS SURGERY    . TEE WITHOUT CARDIOVERSION N/A 10/25/2015   Procedure: TRANSESOPHAGEAL ECHOCARDIOGRAM (TEE);  Surgeon: Burnell Blanks, MD;  Location: [facility];  Service: Open Heart Surgery;  Laterality: N/A;  . TRANSCATHETER AORTIC VALVE REPLACEMENT, TRANSFEMORAL Right 10/25/2015   Procedure: TRANSCATHETER AORTIC VALVE REPLACEMENT, TRANSFEMORAL;  Surgeon: Burnell Blanks, MD;  Location: [facility];  Service: Open Heart Surgery;  Laterality: Right;    Social History    Socioeconomic History  . Marital status: Widowed    Spouse name: Not on file  . Number of children: 3  . Years of education: Not on file  . Highest education level: Not on file  Occupational History  . Not on file  Social Needs  . Financial resource strain: Not on file  . Food insecurity    Worry: Not on file    Inability: Not on file  . Transportation needs    Medical: Not on file    Non-medical: Not on file  Tobacco Use  . Smoking status: Never Smoker  . Smokeless tobacco: Never Used  Substance and Sexual Activity  . Alcohol use: No    Alcohol/week: 0.0 standard drinks  . Drug use: No  . Sexual activity: Never  Lifestyle  . Physical activity    Days per week: Not on file    Minutes per session: Not on file  . Stress: Not on file  Relationships  . Social Herbalist on phone: Not on file    Gets together: Not on file    Attends religious service: Not on file    Active member of club or organization: Not on file    Attends meetings of clubs or organizations: Not on file    Relationship status: Not on file  . Intimate partner violence    Fear of current or ex partner: Not on file    Emotionally abused: Not on file    Physically abused: Not on file    Forced sexual activity: Not on file  Other Topics Concern  . Not on file  Social History Narrative  . Not on file    Outpatient Encounter Medications as of 01/29/2019  Medication Sig  . acetaminophen (TYLENOL) 500 MG tablet Take 500 mg by mouth every 6 (six) hours as needed for mild pain.  [name] albuterol (PROVENTIL HFA;VENTOLIN HFA) 108 (90 Base) MCG/ACT inhaler Inhale 2 puffs into the lungs every 4 (four) hours as needed for wheezing or shortness of breath.  [name] amLODipine (NORVASC) 2.5 MG tablet Take 1 tablet (2.5 mg total) by mouth daily.  . Biotin 10 MG CAPS Take 1 capsule by mouth daily.  . calcium-vitamin D (OSCAL WITH D) 500-200 MG-UNIT tablet Take 1 tablet by mouth daily.  [name] docusate sodium (COLACE) 100  MG capsule Take 100 mg by mouth daily as needed for mild constipation.  . enalapril (VASOTEC) 2.5 MG tablet Take 1 tablet (2.5 mg total) by mouth daily for 30 days.  . ferrous sulfate 325 (65 FE) MG tablet Take 1 tablet (325 mg total) by mouth 2 (two) times daily with a meal.  . furosemide (LASIX) 40 MG tablet Take 1.5 tablets (60 mg total) by mouth daily.  . metoprolol tartrate (LOPRESSOR) 25 MG tablet Take 1 tablet (25 mg total) by mouth 2 (two) times daily.  . multivitamin-iron-minerals-folic acid (CENTRUM) chewable tablet Chew 1 tablet by mouth daily.  . pantoprazole (PROTONIX) 40 MG tablet Take 1 tablet (40 mg total) by mouth daily at 6 (six) AM.  . Zoster Vaccine Adjuvanted [facility]) injection Inject 0.5 mLs into the muscle once  for 1 dose.  . [DISCONTINUED] Apixaban (ELIQUIS PO) Take 2.5 mg by mouth daily. Sample  . [DISCONTINUED] denosumab (PROLIA) 60 MG/ML SOSY injection Inject 60 mg into the skin every 6 (six) months.   No facility-administered encounter medications on file as of 01/29/2019.     Allergies  Allergen Reactions  . Hctz [Hydrochlorothiazide] Other (See Comments)    Pt was ill and affected kidneys   . Codeine Other (See Comments)    Unknown  Made pt feel ill, has not had any problems since 1977    Review of Systems  Constitutional: Positive for fatigue (improving). Negative for activity change, appetite change, chills, diaphoresis, fever, malaise/fatigue and unexpected weight change.  Eyes: Negative for blurred vision, photophobia and visual disturbance.  Respiratory: Negative for chest tightness, shortness of breath and wheezing.   Cardiovascular: Positive for palpitations. Negative for chest pain, orthopnea, leg swelling and PND.  Gastrointestinal: Negative for abdominal pain, anal bleeding and blood in stool.  Genitourinary: Negative for hematuria.  Musculoskeletal: Negative for neck pain.  Neurological: Negative for dizziness, tremors, seizures, syncope,  facial asymmetry, speech difficulty, weakness, light-headedness, numbness and headaches.  Hematological: Does not bruise/bleed easily.  Psychiatric/Behavioral: Negative for confusion.  All other systems reviewed and are negative.       Objective:  BP (!) 148/77   Pulse 62   Temp (!) 97.2 F (36.2 C) (Oral)   Ht 5\' 2"  (1.575 m)   Wt 113 lb (51.3 kg)   BMI 20.67 kg/m    Wt Readings from Last 3 Encounters:  01/29/19 113 lb (51.3 kg)  12/22/18 112 lb (50.8 kg)  11/11/18 111 lb (50.3 kg)    Physical Exam Vitals signs and nursing note reviewed.  Constitutional:      General: She is not in acute distress.    Appearance: Normal appearance. She is well-developed and well-groomed. She is not ill-appearing, toxic-appearing or diaphoretic.  HENT:     Head: Normocephalic and atraumatic.     Jaw: There is normal jaw occlusion.     Right Ear: Hearing normal.     Left Ear: Hearing normal.     Nose: Nose normal.     Mouth/Throat:     Lips: Pink.     Mouth: Mucous membranes are moist.     Pharynx: Oropharynx is clear. Uvula midline.  Eyes:     General: Lids are normal.     Extraocular Movements: Extraocular movements intact.     Conjunctiva/sclera: Conjunctivae normal.     Pupils: Pupils are equal, round, and reactive to light.  Neck:     Musculoskeletal: Normal range of motion and neck supple.     Thyroid: No thyroid mass, thyromegaly or thyroid tenderness.     Vascular: No carotid bruit or JVD.     Trachea: Trachea and phonation normal.  Cardiovascular:     Rate and Rhythm: Normal rate and regular rhythm.     Chest Wall: PMI is not displaced.     Pulses: Normal pulses.     Heart sounds: Normal heart sounds. No murmur. No friction rub. No gallop.   Pulmonary:     Effort: Pulmonary effort is normal. No respiratory distress.     Breath sounds: Normal breath sounds. No wheezing.  Abdominal:     General: Bowel sounds are normal. There is no distension or abdominal bruit.      Palpations: Abdomen is soft. There is no hepatomegaly or splenomegaly.     Tenderness: There is no abdominal  tenderness. There is no right CVA tenderness or left CVA tenderness.     Hernia: No hernia is present.  Musculoskeletal: Normal range of motion.     Right lower leg: No edema.     Left lower leg: No edema.  Lymphadenopathy:     Cervical: No cervical adenopathy.  Skin:    General: Skin is warm and dry.     Capillary Refill: Capillary refill takes less than 2 seconds.     Coloration: Skin is not cyanotic, jaundiced or pale.     Findings: No rash.  Neurological:     General: No focal deficit present.     Mental Status: She is alert and oriented to person, place, and time.     Cranial Nerves: Cranial nerves are intact.     Sensory: Sensation is intact.     Motor: Motor function is intact.     Coordination: Coordination is intact.     Gait: Gait is intact.     Deep Tendon Reflexes: Reflexes are normal and symmetric.  Psychiatric:        Attention and Perception: Attention and perception normal.        Mood and Affect: Mood and affect normal.        Speech: Speech normal.        Behavior: Behavior normal. Behavior is cooperative.        Thought Content: Thought content normal.        Cognition and Memory: Cognition and memory normal.        Judgment: Judgment normal.     Results for orders placed or performed in visit on 12/30/18  CBC with Differential/Platelet  Result Value Ref Range   WBC 4.6 3.4 - 10.8 x10E3/uL   RBC 3.36 (L) 3.77 - 5.28 x10E6/uL   Hemoglobin 10.6 (L) 11.1 - 15.9 g/dL   Hematocrit 31.7 (L) 34.0 - 46.6 %   MCV 94 79 - 97 fL   MCH 31.5 26.6 - 33.0 pg   MCHC 33.4 31.5 - 35.7 g/dL   RDW 13.1 11.7 - 15.4 %   Platelets 192 150 - 450 x10E3/uL   Neutrophils 51 Not Estab. %   Lymphs 27 Not Estab. %   Monocytes 12 Not Estab. %   Eos 8 Not Estab. %   Basos 2 Not Estab. %   Neutrophils Absolute 2.4 1.4 - 7.0 x10E3/uL   Lymphocytes Absolute 1.2 0.7 - 3.1  x10E3/uL   Monocytes Absolute 0.6 0.1 - 0.9 x10E3/uL   EOS (ABSOLUTE) 0.4 0.0 - 0.4 x10E3/uL   Basophils Absolute 0.1 0.0 - 0.2 x10E3/uL   Immature Granulocytes 0 Not Estab. %   Immature Grans (Abs) 0.0 0.0 - 0.1 x10E3/uL       Pertinent labs & imaging results that were available during my care of the patient were reviewed by me and considered in my medical decision making.  Assessment & Plan:  Kinzi was seen today for hypertension.  Diagnoses and all orders for this visit:  Iron deficiency anemia, unspecified iron deficiency anemia type Pt has follow up scheduled with GI on 02/17/2019. Pt has been off of Eliquis for 4 weeks. Pt has been compliant with iron therapy. Will recheck labs today. Follow up as scheduled.  -     Anemia Profile B -     CBC with Differential/Platelet     Continue all other maintenance medications.  Follow up plan: Return in about 3 months (around 05/01/2019), or if symptoms worsen or fail  to improve.  Continue healthy lifestyle choices, including diet (rich in fruits, vegetables, and lean proteins, and low in salt and simple carbohydrates) and exercise (at least 30 minutes of moderate physical activity daily).   The above assessment and management plan was discussed with the patient. The patient verbalized understanding of and has agreed to the management plan. Patient is aware to call the clinic if symptoms persist or worsen. Patient is aware when to return to the clinic for a follow-up visit. Patient educated on when it is appropriate to go to the emergency department.   Monia Pouch, FNP-C [facility] [phone] 01/29/19

## 2019-01-30 LAB — ANEMIA PROFILE B
Basophils Absolute: 0.1 10*3/uL (ref 0.0–0.2)
Basos: 1 %
EOS (ABSOLUTE): 0.2 10*3/uL (ref 0.0–0.4)
Eos: 4 %
Ferritin: 46 ng/mL (ref 15–150)
Folate: >20 ng/mL (ref >3.0)
Hematocrit: 37.7 % (ref 34.0–46.6)
Hemoglobin: 12.9 g/dL (ref 11.1–15.9)
Immature Grans (Abs): 0 10*3/uL (ref 0.0–0.1)
Immature Granulocytes: 0 %
Iron Saturation: 36 % (ref 15–55)
Iron: 116 ug/dL (ref 27–139)
Lymphocytes Absolute: 1.3 10*3/uL (ref 0.7–3.1)
Lymphs: 26 %
MCH: 31.8 pg (ref 26.6–33.0)
MCHC: 34.2 g/dL (ref 31.5–35.7)
MCV: 93 fL (ref 79–97)
Monocytes Absolute: 0.6 10*3/uL (ref 0.1–0.9)
Monocytes: 12 %
Neutrophils Absolute: 2.8 10*3/uL (ref 1.4–7.0)
Neutrophils: 57 %
Platelets: 169 10*3/uL (ref 150–450)
RBC: 4.06 x10E6/uL (ref 3.77–5.28)
RDW: 11.8 % (ref 11.7–15.4)
Retic Ct Pct: 1.1 % (ref 0.6–2.6)
Total Iron Binding Capacity: 322 ug/dL (ref 250–450)
UIBC: 206 ug/dL (ref 118–369)
Vitamin B-12: 988 pg/mL (ref 232–1245)
WBC: 4.9 10*3/uL (ref 3.4–10.8)

## 2019-02-03 DIAGNOSIS — H40002 Preglaucoma, unspecified, left eye: Secondary | ICD-10-CM | POA: Diagnosis not present

## 2019-02-03 DIAGNOSIS — H401111 Primary open-angle glaucoma, right eye, mild stage: Secondary | ICD-10-CM | POA: Diagnosis not present

## 2019-02-09 ENCOUNTER — Ambulatory Visit (INDEPENDENT_AMBULATORY_CARE_PROVIDER_SITE_OTHER): Payer: Medicare Other | Admitting: *Deleted

## 2019-02-09 VITALS — Ht 62.0 in | Wt 113.0 lb

## 2019-02-09 DIAGNOSIS — Z0000 Encounter for general adult medical examination without abnormal findings: Secondary | ICD-10-CM

## 2019-02-09 NOTE — Progress Notes (Signed)
MEDICARE ANNUAL WELLNESS VISIT  02/09/2019  Telephone Visit Disclaimer This Medicare AWV was conducted by telephone due to national recommendations for restrictions regarding the COVID-19 Pandemic (e.g. social distancing).  I verified, using two identifiers, that I am speaking with Crystal Cooper or their authorized healthcare agent. I discussed the limitations, risks, security, and privacy concerns of performing an evaluation and management service by telephone and the potential availability of an in-person appointment in the future. The patient expressed understanding and agreed to proceed.   Subjective:  Crystal Cooper is a 85 y.o. female patient of Rakes, Connye Burkitt, FNP who had a Medicare Annual Wellness Visit today via telephone. Crystal Cooper is Retired and lives alone. she has 3 children. she reports that she is socially active and does interact with friends/family regularly. she is minimally physically active and enjoys reading.  Patient Care Team: Baruch Gouty, FNP as PCP - General (Family Medicine) Ander Slade, Carlisle Beers, MD as Referring Physician (Ophthalmology) Stanford Breed Denice Bors, MD as Consulting Physician (Cardiology) Constance Haw, MD as Consulting Physician (Cardiology)  Advanced Directives 02/09/2019 04/06/2018 04/06/2018 02/05/2018 03/02/2016 10/26/2015 10/21/2015  Does Patient Have a Medical Advance Directive? No - No Yes Yes No No  Type of Advance Directive - - Public librarian;Living will [place] - -  Does patient want to make changes to medical advance directive? - - - No - Patient declined - - -  Copy of Welcome in Chart? - - - No - copy requested No - copy requested - -  Would patient like information on creating a medical advance directive? Yes (MAU/Ambulatory/Procedural Areas - Information given) No - Patient declined - - - No - patient declined information No - patient declined information    Hospital Utilization  Over the Past 12 Months: # of hospitalizations or ER visits: 1 # of surgeries: 0  Review of Systems    Patient reports that her overall health is unchanged compared to last year.  Patient Reported Readings (BP, Pulse, CBG, Weight, etc) none  Review of Systems: History obtained from chart review and the patient General ROS: negative Ophthalmic ROS: positive for - uses glasses ENT ROS: positive for - uses hearing aids  All other systems negative.  Pain Assessment Pain : No/denies pain     Current Medications & Allergies (verified) Allergies as of 02/09/2019      Reactions   Hctz [hydrochlorothiazide] Other (See Comments)   Pt was ill and affected kidneys   Codeine Other (See Comments)   Unknown  Made pt feel ill, has not had any problems since 1977      Medication List       Accurate as of February 09, 2019  9:48 AM. If you have any questions, ask your nurse or doctor.        acetaminophen 500 MG tablet Commonly known as: TYLENOL Take 500 mg by mouth every 6 (six) hours as needed for mild pain.   albuterol 108 (90 Base) MCG/ACT inhaler Commonly known as: VENTOLIN HFA Inhale 2 puffs into the lungs every 4 (four) hours as needed for wheezing or shortness of breath.   amLODipine 2.5 MG tablet Commonly known as: NORVASC Take 1 tablet (2.5 mg total) by mouth daily.   Biotin 10 MG Caps Take 1 capsule by mouth daily.   calcium-vitamin D 500-200 MG-UNIT tablet Commonly known as: OSCAL WITH D Take 1 tablet by mouth daily.   docusate  sodium 100 MG capsule Commonly known as: COLACE Take 100 mg by mouth daily as needed for mild constipation.   enalapril 2.5 MG tablet Commonly known as: VASOTEC Take 1 tablet (2.5 mg total) by mouth daily for 30 days.   ferrous sulfate 325 (65 FE) MG tablet Take 1 tablet (325 mg total) by mouth 2 (two) times daily with a meal.   furosemide 40 MG tablet Commonly known as: LASIX Take 1.5 tablets (60 mg total) by mouth daily.    metoprolol tartrate 25 MG tablet Commonly known as: LOPRESSOR Take 1 tablet (25 mg total) by mouth 2 (two) times daily.   multivitamin-iron-minerals-folic acid chewable tablet Chew 1 tablet by mouth daily.   pantoprazole 40 MG tablet Commonly known as: PROTONIX Take 1 tablet (40 mg total) by mouth daily at 6 (six) AM.       History (reviewed): Past Medical History:  Diagnosis Date  . Anemia    years ago  . Aortic stenosis   . Arthritis   . Asthma   . Atrial fibrillation ([place])   . CHF (congestive heart failure) ([place]) 11/2014  . Family history of adverse reaction to anesthesia    2 daughters would have N/V  . Glaucoma   . Hearing loss   . Heart murmur   . HTN (hypertension)   . Pneumonia   . Prolapsing mitral leaflet syndrome   . Shortness of breath dyspnea    with exertion  . SVT (supraventricular tachycardia) ([facility])    S/P ablation of AVNRT in 2003   Past Surgical History:  Procedure Laterality Date  . APPENDECTOMY    . BIOPSY  04/07/2018   Procedure: BIOPSY;  Surgeon: Jerene Bears, MD;  Location: [facility] ENDOSCOPY;  Service: Gastroenterology;;  . BLADDER SURGERY    . CARDIAC CATHETERIZATION N/A 09/26/2015   Procedure: Right/Left Heart Cath and Coronary Angiography;  Surgeon: Burnell Blanks, MD;  [facility];  Service: Cardiovascular;  Laterality: N/A;  . CARDIAC SURGERY    . CARDIOVERSION N/A 03/02/2016   Procedure: CARDIOVERSION;  Surgeon: Thayer Headings, MD;  Location: [place];  Service: Cardiovascular;  Laterality: N/A;  . EP IMPLANTABLE DEVICE N/A 10/26/2015   Procedure: Pacemaker Implant;  Surgeon: Will Meredith Leeds, MD;  Location: [place] [facility];  Service: Cardiovascular;  Laterality: N/A;  . ESOPHAGOGASTRODUODENOSCOPY (EGD) WITH PROPOFOL N/A 04/07/2018   Procedure: ESOPHAGOGASTRODUODENOSCOPY (EGD) WITH PROPOFOL;  Surgeon: Jerene Bears, MD;  Location: [facility] ENDOSCOPY;  Service: Gastroenterology;  Laterality: N/A;  . EYE SURGERY  Bilateral    cataract surgery  . HOT HEMOSTASIS N/A 04/07/2018   Procedure: HOT HEMOSTASIS (ARGON PLASMA COAGULATION/BICAP);  Surgeon: Jerene Bears, MD;  Location[facility] ENDOSCOPY;  Service: Gastroenterology;  Laterality: N/A;  . NASAL SINUS SURGERY    . TEE WITHOUT CARDIOVERSION N/A 10/25/2015   Procedure: TRANSESOPHAGEAL ECHOCARDIOGRAM (TEE);  Surgeon: Burnell Blanks, MD;  Location: [place];  Service: Open Heart Surgery;  Laterality: N/A;  . TRANSCATHETER AORTIC VALVE REPLACEMENT, TRANSFEMORAL Right 10/25/2015   Procedure: TRANSCATHETER AORTIC VALVE REPLACEMENT, TRANSFEMORAL;  Surgeon: Burnell Blanks, MD;  Location: [place];  Service: Open Heart Surgery;  Laterality: Right;   Family History  Problem Relation Age of Onset  . Hypertension Mother   . Pneumonia Father   . Heart attack Neg Hx   . Colon cancer Neg Hx   . Esophageal cancer Neg Hx    Social History   Socioeconomic History  . Marital status: Widowed  Spouse name: Not on file  . Number of children: 3  . Years of education: Not on file  . Highest education level: 12th grade  Occupational History  . Occupation: Retired  Scientific laboratory technician  . Financial resource strain: Not on file  . Food insecurity    Worry: Never true    Inability: Never true  . Transportation needs    Medical: No    Non-medical: No  Tobacco Use  . Smoking status: Never Smoker  . Smokeless tobacco: Never Used  Substance and Sexual Activity  . Alcohol use: No    Alcohol/week: 0.0 standard drinks  . Drug use: No  . Sexual activity: Never  Lifestyle  . Physical activity    Days per week: 0 days    Minutes per session: 0 min  . Stress: Not at all  Relationships  . Social connections    Talks on phone: More than three times a week    Gets together: More than three times a week    Attends religious service: More than 4 times per year    Active member of club or organization: Yes    Attends meetings of clubs or organizations: More than 4  times per year    Relationship status: Widowed  Other Topics Concern  . Not on file  Social History Narrative  . Not on file    Activities of Daily Living In your present state of health, do you have any difficulty performing the following activities: 02/09/2019 04/06/2018  Hearing? Y Y  Comment Wear Hearing Aids -  Vision? N N  Difficulty concentrating or making decisions? N N  Walking or climbing stairs? Y Y  Dressing or bathing? N N  Doing errands, shopping? Y N  Preparing Food and eating ? N -  Using the Toilet? N -  In the past six months, have you accidently leaked urine? N -  Do you have problems with loss of bowel control? N -  Managing your Medications? N -  Managing your Finances? N -  Some recent data might be hidden    Patient Education/ Literacy How often do you need to have someone help you when you read instructions, pamphlets, or other written materials from your doctor or pharmacy?: 1 - Never What is the last grade level you completed in school?: GED  Exercise Current Exercise Habits: Home exercise routine, Type of exercise: walking, Time (Minutes): 15, Frequency (Times/Week): 7, Weekly Exercise (Minutes/Week): 105, Intensity: Mild, Exercise limited by: None identified  Diet Patient reports consuming 3 meals a day and 2 snack(s) a day Patient reports that her primary diet is: Regular Patient reports that she does have regular access to food.   Depression Screen PHQ 2/9 Scores 02/09/2019 12/22/2018 11/11/2018 08/13/2018 06/16/2018 04/15/2018 03/17/2018  PHQ - 2 Score 0 0 0 0 0 0 0     Fall Risk Fall Risk  02/09/2019 12/22/2018 11/11/2018 08/13/2018 06/16/2018  Falls in the past year? 0 0 0 0 0  Number falls in past yr: 0 - - - -  Injury with Fall? 0 - - - -  Risk for fall due to : History of fall(s);Impaired balance/gait - - - -     Objective:  Crystal Cooper seemed alert and oriented and she participated appropriately during our telephone visit.  Blood  Pressure Weight BMI  BP Readings from Last 3 Encounters:  01/29/19 (!) 148/77  12/22/18 (!) 147/66  11/11/18 (!) 152/69   Wt Readings from  Last 3 Encounters:  02/09/19 113 lb (51.3 kg)  01/29/19 113 lb (51.3 kg)  12/22/18 112 lb (50.8 kg)   BMI Readings from Last 1 Encounters:  02/09/19 20.67 kg/m    *Unable to obtain current vital signs, weight, and BMI due to telephone visit type  Hearing/Vision  . Crystal Cooper did  seem to have difficulty with hearing/understanding during the telephone conversation . Reports that she has had a formal eye exam by an eye care professional within the past year . Reports that she has not had a formal hearing evaluation within the past year *Unable to fully assess hearing and vision during telephone visit type  Cognitive Function: 6CIT Screen 02/09/2019  What Year? 0 points  What month? 0 points  What time? 0 points  Count back from 20 0 points  Months in reverse 0 points  Repeat phrase 0 points  Total Score 0   (Normal:0-7, Significant for Dysfunction: >8)  Normal Cognitive Function Screening: Yes   Immunization & Health Maintenance Record Immunization History  Administered Date(s) Administered  . Influenza, High Dose Seasonal PF 03/26/2015, 05/01/2017, 04/08/2018  . Influenza-Unspecified 04/26/2016  . Pneumococcal Conjugate-13 08/10/2016  . Pneumococcal Polysaccharide-23 04/10/1996  . Tdap 08/16/2016  . Zoster Recombinat (Shingrix) 02/05/2018    Health Maintenance  Topic Date Due  . INFLUENZA VACCINE  01/24/2019  . TETANUS/TDAP  08/16/2026  . DEXA SCAN  Completed  . PNA vac Low Risk Adult  Completed       Assessment  This is a routine wellness examination for Crystal Cooper.  Health Maintenance: Due or Overdue Health Maintenance Due  Topic Date Due  . INFLUENZA VACCINE  01/24/2019    Crystal Cooper does not need a referral for Community Assistance: Care Management:   no Social Work:    no Prescription Assistance:  no  Nutrition/Diabetes Education:  no   Plan:  Personalized Goals Goals Addressed   None    Personalized Health Maintenance & Screening Recommendations  Influenza vaccine Advanced directives: has NO advanced directive  - add't info requested. Referral to SW: no  Lung Cancer Screening Recommended: no (Low Dose CT Chest recommended if Age 46-80 years, 30 pack-year currently smoking OR have quit w/in past 15 years) Hepatitis C Screening recommended: no HIV Screening recommended: no  Advanced Directives: Written information was prepared per patient's request.  Referrals & Orders No orders of the defined types were placed in this encounter.   Follow-up Plan . Follow-up with Baruch Gouty, FNP as planned . Discuss Advance Directive, Living Will and Power of Attorney with Family.    I have personally reviewed and noted the following in the patient's chart:   . Medical and social history . Use of alcohol, tobacco or illicit drugs  . Current medications and supplements . Functional ability and status . Nutritional status . Physical activity . Advanced directives . List of other physicians . Hospitalizations, surgeries, and ER visits in previous 12 months . Vitals . Screenings to include cognitive, depression, and falls . Referrals and appointments  In addition, I have reviewed and discussed with Kele W Bluett certain preventive protocols, quality metrics, and best practice recommendations. A written personalized care plan for preventive services as well as general preventive health recommendations is available and can be mailed to the patient at her request.      Wardell Heath, LPN  01/04/4579

## 2019-02-09 NOTE — Patient Instructions (Signed)
[name] , Thank you for taking time to come for your Medicare Wellness Visit. I appreciate your ongoing commitment to your health goals. Please review the following plan we discussed and let me know if I can assist you in the future.   These are the goals we discussed: Goals     Exercise 3x per week (30 min per time)     Increase walking time by 15 minutes per day       This is a list of the screening recommended for you and due dates:  Health Maintenance  Topic Date Due   Flu Shot  01/24/2019   Tetanus Vaccine  08/16/2026   DEXA scan (bone density measurement)  Completed   Pneumonia vaccines  Completed     Advance Directive  Advance directives are legal documents that let you make choices ahead of time about your health care and medical treatment in case you become unable to communicate for yourself. Advance directives are a way for you to communicate your wishes to family, friends, and health care providers. This can help convey your decisions about end-of-life care if you become unable to communicate. Discussing and writing advance directives should happen over time rather than all at once. Advance directives can be changed depending on your situation and what you want, even after you have signed the advance directives. If you do not have an advance directive, some states assign family decision makers to act on your behalf based on how closely you are related to them. Each state has its own laws regarding advance directives. You may want to check with your health care provider, attorney, or state representative about the laws in your state. There are different types of advance directives, such as:  Medical power of attorney.  Living will.  Do not resuscitate (DNR) or do not attempt resuscitation (DNAR) order. Health care proxy and medical power of attorney A health care proxy, also called a health care agent, is a person who is appointed to make medical decisions for you  in cases in which you are unable to make the decisions yourself. Generally, people choose someone they know well and trust to represent their preferences. Make sure to ask this person for an agreement to act as your proxy. A proxy may have to exercise judgment in the event of a medical decision for which your wishes are not known. A medical power of attorney is a legal document that names your health care proxy. Depending on the laws in your state, after the document is written, it may also need to be:  Signed.  Notarized.  Dated.  Copied.  Witnessed.  Incorporated into your medical record. You may also want to appoint someone to manage your financial affairs in a situation in which you are unable to do so. This is called a durable power of attorney for finances. It is a separate legal document from the durable power of attorney for health care. You may choose the same person or someone different from your health care proxy to act as your agent in financial matters. If you do not appoint a proxy, or if there is a concern that the proxy is not acting in your best interests, a court-appointed guardian may be designated to act on your behalf. Living will A living will is a set of instructions documenting your wishes about medical care when you cannot express them yourself. Health care providers should keep a copy of your living will in your medical record.  You may want to give a copy to family members or friends. To alert caregivers in case of an emergency, you can place a card in your wallet to let them know that you have a living will and where they can find it. A living will is used if you become:  Terminally ill.  Incapacitated.  Unable to communicate or make decisions. Items to consider in your living will include:  The use or non-use of life-sustaining equipment, such as dialysis machines and breathing machines (ventilators).  A DNR or DNAR order, which is the instruction not to use  cardiopulmonary resuscitation (CPR) if breathing or heartbeat stops.  The use or non-use of tube feeding.  Withholding of food and fluids.  Comfort (palliative) care when the goal becomes comfort rather than a cure.  Organ and tissue donation. A living will does not give instructions for distributing your money and property if you should pass away. It is recommended that you seek the advice of a lawyer when writing a will. Decisions about taxes, beneficiaries, and asset distribution will be legally binding. This process can relieve your family and friends of any concerns surrounding disputes or questions that may come up about the distribution of your assets. DNR or DNAR A DNR or DNAR order is a request not to have CPR in the event that your heart stops beating or you stop breathing. If a DNR or DNAR order has not been made and shared, a health care provider will try to help any patient whose heart has stopped or who has stopped breathing. If you plan to have surgery, talk with your health care provider about how your DNR or DNAR order will be followed if problems occur. Summary  Advance directives are the legal documents that allow you to make choices ahead of time about your health care and medical treatment in case you become unable to communicate for yourself.  The process of discussing and writing advance directives should happen over time. You can change the advance directives, even after you have signed them.  Advance directives include DNR or DNAR orders, living wills, and designating an agent as your medical power of attorney. This information is not intended to replace advice given to you by your health care provider. Make sure you discuss any questions you have with your health care provider. Document Released: 09/18/2007 Document Revised: 07/16/2018 Document Reviewed: 04/30/2016 Elsevier Patient Education  2020 [employer].

## 2019-02-11 ENCOUNTER — Ambulatory Visit (INDEPENDENT_AMBULATORY_CARE_PROVIDER_SITE_OTHER): Payer: Medicare Other | Admitting: *Deleted

## 2019-02-11 DIAGNOSIS — R001 Bradycardia, unspecified: Secondary | ICD-10-CM

## 2019-02-11 LAB — CUP PACEART REMOTE DEVICE CHECK
Battery Remaining Longevity: 83 mo
Battery Voltage: 3.01 V
Brady Statistic AP VP Percent: 18.45 %
Brady Statistic AP VS Percent: 20.93 %
Brady Statistic AS VP Percent: 9.01 %
Brady Statistic AS VS Percent: 51.61 %
Brady Statistic RA Percent Paced: 38.91 %
Brady Statistic RV Percent Paced: 27.8 %
Date Time Interrogation Session: 20200819124818
Implantable Lead Implant Date: 20170503
Implantable Lead Implant Date: 20170503
Implantable Lead Location: 753859
Implantable Lead Location: 753860
Implantable Lead Model: 5076
Implantable Lead Model: 5076
Implantable Pulse Generator Implant Date: 20170503
Lead Channel Impedance Value: 304 Ohm
Lead Channel Impedance Value: 380 Ohm
Lead Channel Impedance Value: 437 Ohm
Lead Channel Impedance Value: 456 Ohm
Lead Channel Pacing Threshold Amplitude: 0.75 V
Lead Channel Pacing Threshold Amplitude: 1.25 V
Lead Channel Pacing Threshold Pulse Width: 0.4 ms
Lead Channel Pacing Threshold Pulse Width: 0.4 ms
Lead Channel Sensing Intrinsic Amplitude: 4.375 mV
Lead Channel Sensing Intrinsic Amplitude: 4.375 mV
Lead Channel Sensing Intrinsic Amplitude: 9.375 mV
Lead Channel Sensing Intrinsic Amplitude: 9.375 mV
Lead Channel Setting Pacing Amplitude: 2 V
Lead Channel Setting Pacing Amplitude: 2.5 V
Lead Channel Setting Pacing Pulse Width: 0.4 ms
Lead Channel Setting Sensing Sensitivity: 2.8 mV

## 2019-02-16 ENCOUNTER — Encounter: Payer: Self-pay | Admitting: Family Medicine

## 2019-02-17 ENCOUNTER — Other Ambulatory Visit: Payer: Self-pay | Admitting: Family Medicine

## 2019-02-17 ENCOUNTER — Ambulatory Visit (INDEPENDENT_AMBULATORY_CARE_PROVIDER_SITE_OTHER): Payer: Medicare Other | Admitting: Gastroenterology

## 2019-02-17 ENCOUNTER — Encounter: Payer: Self-pay | Admitting: Gastroenterology

## 2019-02-17 ENCOUNTER — Encounter: Payer: Self-pay | Admitting: Family Medicine

## 2019-02-17 VITALS — BP 160/70 | HR 72 | Temp 97.9°F | Ht 61.0 in | Wt 113.0 lb

## 2019-02-17 DIAGNOSIS — I480 Paroxysmal atrial fibrillation: Secondary | ICD-10-CM

## 2019-02-17 DIAGNOSIS — D649 Anemia, unspecified: Secondary | ICD-10-CM | POA: Diagnosis not present

## 2019-02-17 MED ORDER — METOPROLOL TARTRATE 25 MG PO TABS: 12.5000 mg | ORAL_TABLET | Freq: Every day | ORAL | 3 refills | Status: DC

## 2019-02-17 NOTE — Progress Notes (Signed)
02/17/2019 Crystal Cooper [id number] 1926/11/02   HISTORY OF PRESENT ILLNESS: This is a 85 year old female who is a patient of Dr. Vena Rua.  She was hospitalized for anemia and heme positive stools in October 2019.  Underwent EGD at that time, which showed the following:  - Non-obstructing and mild Schatzki ring. - 2 cm hiatal hernia. - Non-bleeding erosive gastropathy in proximal stomach. Biopsied. - Mild bulbar duodenitis. - A single angioectasia in the duodenum. Treated with argon plasma coagulation (APC).  Hemoglobin has been monitored since that time.  Eventually increased to just above 11 g.  Then she had a trend down to 9 g range in about February of this year, at which point it seems that her Eliquis was held again.  Then she had an increase in hemoglobin again and has been holding stable at at about 10.6 to 10.8 g since March.  Is on iron supplements twice a day.  Has maintained off of Eliquis.  Most recent CBC and iron studies on August 6 of this year were completely normal with a hemoglobin of 12.9 g.  She denies any sign of overt bleeding.  She says that her stools are dark because of the iron supplements, but she has not seen any change with those.  She takes stool softener, but moves her bowels well with those.  She continues on Protonix 40 mg daily.  She was on the Eliquis for atrial fibrillation, but according to cardiology's note she has been maintaining in sinus rhythm.  She hates being off Eliquis and would like to resume it.   Past Medical History:  Diagnosis Date  . Anemia    years ago  . Aortic stenosis   . Arthritis   . Asthma   . Atrial fibrillation ([place])   . CHF (congestive heart failure) ([place]) 11/2014  . Family history of adverse reaction to anesthesia    2 daughters would have N/V  . Glaucoma   . Hearing loss   . Heart murmur   . HTN (hypertension)   . Pneumonia   . Prolapsing mitral leaflet syndrome   . Shortness of breath dyspnea    with exertion   . SVT (supraventricular tachycardia) ([facility])    S/P ablation of AVNRT in 2003   Past Surgical History:  Procedure Laterality Date  . APPENDECTOMY    . BIOPSY  04/07/2018   Procedure: BIOPSY;  Surgeon: Jerene Bears, MD;  Location: [facility] ENDOSCOPY;  Service: Gastroenterology;;  . BLADDER SURGERY    . CARDIAC CATHETERIZATION N/A 09/26/2015   Procedure: Right/Left Heart Cath and Coronary Angiography;  Surgeon: Burnell Blanks, MD;  Location: [place] CV LAB;  Service: Cardiovascular;  Laterality: N/A;  . CARDIAC SURGERY    . CARDIOVERSION N/A 03/02/2016   Procedure: CARDIOVERSION;  Surgeon: Thayer Headings, MD;  Location: [place];  Service: Cardiovascular;  Laterality: N/A;  . EP IMPLANTABLE DEVICE N/A 10/26/2015   Procedure: Pacemaker Implant;  Surgeon: Will Meredith Leeds, MD;  Location: [place] CV LAB;  Service: Cardiovascular;  Laterality: N/A;  . ESOPHAGOGASTRODUODENOSCOPY (EGD) WITH PROPOFOL N/A 04/07/2018   Procedure: ESOPHAGOGASTRODUODENOSCOPY (EGD) WITH PROPOFOL;  Surgeon: Jerene Bears, MD;  Location: [facility] ENDOSCOPY;  Service: Gastroenterology;  Laterality: N/A;  . EYE SURGERY Bilateral    cataract surgery  . HOT HEMOSTASIS N/A 04/07/2018   Procedure: HOT HEMOSTASIS (ARGON PLASMA COAGULATION/BICAP);  Surgeon: Jerene Bears, MD;  Location: [facility] ENDOSCOPY;  Service: Gastroenterology;  Laterality: N/A;  . NASAL  SINUS SURGERY    . TEE WITHOUT CARDIOVERSION N/A 10/25/2015   Procedure: TRANSESOPHAGEAL ECHOCARDIOGRAM (TEE);  Surgeon: Burnell Blanks, MD;  Location: [place];  Service: Open Heart Surgery;  Laterality: N/A;  . TRANSCATHETER AORTIC VALVE REPLACEMENT, TRANSFEMORAL Right 10/25/2015   Procedure: TRANSCATHETER AORTIC VALVE REPLACEMENT, TRANSFEMORAL;  Surgeon: Burnell Blanks, MD;  Location: [place];  Service: Open Heart Surgery;  Laterality: Right;    reports that she has never smoked. She has never used smokeless tobacco. She reports that she does not drink alcohol  or use drugs. family history includes Hypertension in her mother; Pneumonia in her father. Allergies  Allergen Reactions  . Hctz [Hydrochlorothiazide] Other (See Comments)    Pt was ill and affected kidneys   . Codeine Other (See Comments)    Unknown  Made pt feel ill, has not had any problems since 1977      Outpatient Encounter Medications as of 02/17/2019  Medication Sig  . acetaminophen (TYLENOL) 500 MG tablet Take 500 mg by mouth every 6 (six) hours as needed for mild pain.  [name] albuterol (PROVENTIL HFA;VENTOLIN HFA) 108 (90 Base) MCG/ACT inhaler Inhale 2 puffs into the lungs every 4 (four) hours as needed for wheezing or shortness of breath.  [name] amLODipine (NORVASC) 2.5 MG tablet Take 1 tablet (2.5 mg total) by mouth daily.  . Biotin 10 MG CAPS Take 1 capsule by mouth daily.  . calcium-vitamin D (OSCAL WITH D) 500-200 MG-UNIT tablet Take 1 tablet by mouth daily.  [name] docusate sodium (COLACE) 100 MG capsule Take 100 mg by mouth daily as needed for mild constipation.  . enalapril (VASOTEC) 2.5 MG tablet Take 1 tablet (2.5 mg total) by mouth daily for 30 days.  . ferrous sulfate 325 (65 FE) MG tablet Take 1 tablet (325 mg total) by mouth 2 (two) times daily with a meal.  . furosemide (LASIX) 40 MG tablet Take 1.5 tablets (60 mg total) by mouth daily.  . metoprolol tartrate (LOPRESSOR) 25 MG tablet Take 0.5 tablets (12.5 mg total) by mouth daily.  . multivitamin-iron-minerals-folic acid (CENTRUM) chewable tablet Chew 1 tablet by mouth daily.  . pantoprazole (PROTONIX) 40 MG tablet Take 1 tablet (40 mg total) by mouth daily at 6 (six) AM.   No facility-administered encounter medications on file as of 02/17/2019.       REVIEW OF SYSTEMS  : All other systems reviewed and negative except where noted in the History of Present Illness.   PHYSICAL EXAM: BP (!) 160/70 (BP Location: Right Arm, Patient Position: Sitting, Cuff Size: Normal)   Pulse 72   Temp 97.9 F (36.6 C)   Ht 5\' 1"  (1.549  m) Comment: height measured without shoes  Wt 113 lb (51.3 kg)   BMI 21.35 kg/m  General: Well developed white female in no acute distress Head: Normocephalic and atraumatic Eyes:  Sclerae anicteric, conjunctiva pink. Ears: Normal auditory acuity Lungs: Clear throughout to auscultation; no increased WOB. Heart: Regular rate and rhythm; no M/R/G. Abdomen: Soft, non-distended.  BS present.  Non-tender. Musculoskeletal: Symmetrical with no gross deformities  Skin: No lesions on visible extremities Extremities: No edema  Neurological: Alert oriented x 4, grossly non-focal Psychological:  Alert and cooperative. Normal mood and affect  ASSESSMENT AND PLAN: *Anemia, unspecified: Recent CBC and iron studies earlier this month completely normal with a hemoglobin of 12.9 grams.  She has been off of her Eliquis for the past several months.  She would like to resume this, but at  this point I am not sure how necessary it is.  She has an appoint with Dr. Stanford Breed next week at which point she can discuss resuming that with him.  I think from a GI standpoint if it is deemed necessary then she is fine to resume it with close monitoring of her hemoglobin monthly.  She has had no overt signs of bleeding.  If there continues to be a downward trend in her hemoglobin either on or off of the Eliquis then could perform stool Hemoccult cards, which have been sent with them today.  Not planning any procedures from a GI standpoint currently for now.  CC:  Rakes, Connye Burkitt, FNP

## 2019-02-17 NOTE — Patient Instructions (Signed)
Please do hemoccults if your hemoglobin starts to trend down.  Follow the instructions on the Hemoccult cards and mail them back to [place] when you are finished or you may take them directly to the lab in the basement of the [facility]. We will call you with the results.

## 2019-02-19 ENCOUNTER — Encounter: Payer: Self-pay | Admitting: Cardiology

## 2019-02-19 NOTE — Progress Notes (Signed)
Remote pacemaker transmission.   

## 2019-02-19 NOTE — Progress Notes (Signed)
Addendum: Reviewed and agree with assessment and management plan. Pyrtle, Jay M, MD  

## 2019-02-23 NOTE — Progress Notes (Signed)
HPI: FU AVRand PAF. History of ablation of AVNRT in 2003. Echocardiogram repeated February 2017 and showed normal LV systolic function, severe aortic stenosis with mean gradient 43 mmHg. Cath 2/17 showed no significant CAD. Had TAVR 5/17. Had PM 5/17. Also with afib/flutter. Had DCCV 03/02/16. Echocardiogram October 2019 showed normal LV function, moderate diastolic dysfunction, prior aortic valve replacement with mean gradient 6 mmHg, mild mitral regurgitation, biatrial enlargement, mild tricuspid regurgitation and moderately elevated pulmonary pressure. Patient admitted with GI bleeding October 2019 with hemoglobin 5.4. Eliquis was held and she was transfused. 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 Patient seen by gastroenterology on August 25 and apixaban could be resumed if necessary.  Since last seen,she has dyspnea with more vigorous activities.  There is no orthopnea, PND, pedal edema, chest pain or syncope.  No hematochezia.  Current Outpatient Medications  Medication Sig Dispense Refill  . acetaminophen (TYLENOL) 500 MG tablet Take 500 mg by mouth every 6 (six) hours as needed for mild pain.    [name] albuterol (PROVENTIL HFA;VENTOLIN HFA) 108 (90 Base) MCG/ACT inhaler Inhale 2 puffs into the lungs every 4 (four) hours as needed for wheezing or shortness of breath. 18 g 3  . amLODipine (NORVASC) 2.5 MG tablet Take 1 tablet (2.5 mg total) by mouth daily. 90 tablet 1  . Biotin 10 MG CAPS Take 1 capsule by mouth daily.    . calcium-vitamin D (OSCAL WITH D) 500-200 MG-UNIT tablet Take 1 tablet by mouth daily.    [name] docusate sodium (COLACE) 100 MG capsule Take 100 mg by mouth daily as needed for mild constipation.    . ferrous sulfate 325 (65 FE) MG tablet Take 1 tablet (325 mg total) by mouth 2 (two) times daily with a meal.  3  . furosemide (LASIX) 40 MG tablet Take 1.5 tablets (60 mg total) by mouth daily. 135 tablet 3  . metoprolol tartrate (LOPRESSOR) 25 MG tablet Take 0.5  tablets (12.5 mg total) by mouth daily. 180 tablet 3  . multivitamin-iron-minerals-folic acid (CENTRUM) chewable tablet Chew 1 tablet by mouth daily.    . enalapril (VASOTEC) 2.5 MG tablet Take 1 tablet (2.5 mg total) by mouth daily for 30 days. 30 tablet 3  . pantoprazole (PROTONIX) 40 MG tablet Take 1 tablet (40 mg total) by mouth daily at 6 (six) AM. 90 tablet 1   No current facility-administered medications for this visit.      Past Medical History:  Diagnosis Date  . Anemia    years ago  . Aortic stenosis   . Arthritis   . Asthma   . Atrial fibrillation ([place])   . CHF (congestive heart failure) ([place]) 11/2014  . Family history of adverse reaction to anesthesia    2 daughters would have N/V  . Glaucoma   . Hearing loss   . Heart murmur   . HTN (hypertension)   . Pneumonia   . Prolapsing mitral leaflet syndrome   . Shortness of breath dyspnea    with exertion  . SVT (supraventricular tachycardia) ([facility])    S/P ablation of AVNRT in 2003    Past Surgical History:  Procedure Laterality Date  . APPENDECTOMY    . BIOPSY  04/07/2018   Procedure: BIOPSY;  Surgeon: Jerene Bears, MD;  Location: [facility] ENDOSCOPY;  Service: Gastroenterology;;  . BLADDER SURGERY    . CARDIAC CATHETERIZATION N/A 09/26/2015   Procedure: Right/Left Heart Cath and Coronary Angiography;  Surgeon: Annita Brod  Angelena Form, MD;  Location: [place] CV LAB;  Service: Cardiovascular;  Laterality: N/A;  . CARDIAC SURGERY    . CARDIOVERSION N/A 03/02/2016   Procedure: CARDIOVERSION;  Surgeon: Thayer Headings, MD;  Location: [place];  Service: Cardiovascular;  Laterality: N/A;  . EP IMPLANTABLE DEVICE N/A 10/26/2015   Procedure: Pacemaker Implant;  Surgeon: Will Meredith Leeds, MD;  Location: [place] CV LAB;  Service: Cardiovascular;  Laterality: N/A;  . ESOPHAGOGASTRODUODENOSCOPY (EGD) WITH PROPOFOL N/A 04/07/2018   Procedure: ESOPHAGOGASTRODUODENOSCOPY (EGD) WITH PROPOFOL;  Surgeon: Jerene Bears, MD;   Location: [facility] ENDOSCOPY;  Service: Gastroenterology;  Laterality: N/A;  . EYE SURGERY Bilateral    cataract surgery  . HOT HEMOSTASIS N/A 04/07/2018   Procedure: HOT HEMOSTASIS (ARGON PLASMA COAGULATION/BICAP);  Surgeon: Jerene Bears, MD;  Location: [facility] ENDOSCOPY;  Service: Gastroenterology;  Laterality: N/A;  . NASAL SINUS SURGERY    . TEE WITHOUT CARDIOVERSION N/A 10/25/2015   Procedure: TRANSESOPHAGEAL ECHOCARDIOGRAM (TEE);  Surgeon: Burnell Blanks, MD;  Location: [place];  Service: Open Heart Surgery;  Laterality: N/A;  . TRANSCATHETER AORTIC VALVE REPLACEMENT, TRANSFEMORAL Right 10/25/2015   Procedure: TRANSCATHETER AORTIC VALVE REPLACEMENT, TRANSFEMORAL;  Surgeon: Burnell Blanks, MD;  Location: [place];  Service: Open Heart Surgery;  Laterality: Right;    Social History   Socioeconomic History  . Marital status: Widowed    Spouse name: Not on file  . Number of children: 3  . Years of education: Not on file  . Highest education level: 12th grade  Occupational History  . Occupation: Retired  Scientific laboratory technician  . Financial resource strain: Not on file  . Food insecurity    Worry: Never true    Inability: Never true  . Transportation needs    Medical: No    Non-medical: No  Tobacco Use  . Smoking status: Never Smoker  . Smokeless tobacco: Never Used  Substance and Sexual Activity  . Alcohol use: No    Alcohol/week: 0.0 standard drinks  . Drug use: No  . Sexual activity: Never  Lifestyle  . Physical activity    Days per week: 0 days    Minutes per session: 0 min  . Stress: Not at all  Relationships  . Social connections    Talks on phone: More than three times a week    Gets together: More than three times a week    Attends religious service: More than 4 times per year    Active member of club or organization: Yes    Attends meetings of clubs or organizations: More than 4 times per year    Relationship status: Widowed  . Intimate partner violence    Fear of  current or ex partner: No    Emotionally abused: No    Physically abused: No    Forced sexual activity: No  Other Topics Concern  . Not on file  Social History Narrative  . Not on file    Family History  Problem Relation Age of Onset  . Hypertension Mother   . Pneumonia Father   . Heart attack Neg Hx   . Colon cancer Neg Hx   . Esophageal cancer Neg Hx     ROS: no fevers or chills, productive cough, hemoptysis, dysphasia, odynophagia, melena, hematochezia, dysuria, hematuria, rash, seizure activity, orthopnea, PND, pedal edema, claudication. Remaining systems are negative.  Physical Exam: Well-developed well-nourished in no acute distress.  Skin is warm and dry.  HEENT is normal.  Neck is supple.  Chest is clear to auscultation with normal expansion.  Cardiovascular exam is regular rate and rhythm.  Abdominal exam nontender or distended. No masses palpated. Extremities show no edema. neuro grossly intact  ECG-normal LV function, first-degree AV block, cannot rule out anterior infarct.  Personally reviewed  A/P  1 status post TAVR-continue SBE prophylaxis.  2 paroxysmal atrial fibrillation-patient has had no melena or hematochezia and her hemoglobin has been stable.  We will resume apixaban 2.5 mg BID.  I will plan to check CBC in 2 weeks and again in 4 weeks.  If she develops any recurrent GI bleeding we will plan to discontinue anticoagulation permanently.  3 prior pacemaker-managed by electrophysiology.  4 hypertension-patient's blood pressure is controlled.  Continue present medications and follow.  5 chronic diastolic congestive heart failure-euvolemic based on exam.  Continue present dose of Lasix, fluid restriction and low-sodium diet.  [name], MD

## 2019-02-24 ENCOUNTER — Other Ambulatory Visit: Payer: Self-pay

## 2019-02-24 ENCOUNTER — Encounter: Payer: Self-pay | Admitting: Cardiology

## 2019-02-24 ENCOUNTER — Ambulatory Visit (INDEPENDENT_AMBULATORY_CARE_PROVIDER_SITE_OTHER): Payer: Medicare Other | Admitting: Cardiology

## 2019-02-24 VITALS — BP 130/68 | HR 73 | Temp 97.9°F | Ht 61.0 in | Wt 111.0 lb

## 2019-02-24 DIAGNOSIS — I10 Essential (primary) hypertension: Secondary | ICD-10-CM

## 2019-02-24 DIAGNOSIS — Z952 Presence of prosthetic heart valve: Secondary | ICD-10-CM

## 2019-02-24 DIAGNOSIS — I480 Paroxysmal atrial fibrillation: Secondary | ICD-10-CM

## 2019-02-24 MED ORDER — APIXABAN 2.5 MG PO TABS: 2.5000 mg | ORAL_TABLET | Freq: Two times a day (BID) | ORAL | 6 refills | Status: DC

## 2019-02-24 NOTE — Patient Instructions (Signed)
Medication Instructions:  START ELIQUIS 2.5 MG ONE TABLET TWICE DAILY  If you need a refill on your cardiac medications before your next appointment, please call your pharmacy.   Lab work: Your physician recommends that you return for lab work in: [place]  If you have labs (blood work) drawn today and your tests are completely normal, you will receive your results only by: [name] MyChart Message (if you have MyChart) OR . A paper copy in the mail If you have any lab test that is abnormal or we need to change your treatment, we will call you to review the results.  Follow-Up: At [facility], you and your health needs are our priority.  As part of our continuing mission to provide you with exceptional heart care, we have created designated Provider Care Teams.  These Care Teams include your primary Cardiologist (physician) and Advanced Practice Providers (APPs -  Physician Assistants and Nurse Practitioners) who all work together to provide you with the care you need, when you need it. Your physician recommends that you schedule a follow-up appointment in: [place] wants you to follow-up in: 4-5 [place] will receive a reminder letter in the mail two months in advance. If you don't receive a letter, please call our office to schedule the follow-up appointment.

## 2019-03-10 ENCOUNTER — Other Ambulatory Visit: Payer: Medicare Other

## 2019-03-10 ENCOUNTER — Other Ambulatory Visit: Payer: Self-pay

## 2019-03-10 DIAGNOSIS — I480 Paroxysmal atrial fibrillation: Secondary | ICD-10-CM | POA: Diagnosis not present

## 2019-03-11 LAB — CBC
Hematocrit: 32.2 % — ABNORMAL LOW (ref 34.0–46.6)
Hemoglobin: 11.1 g/dL (ref 11.1–15.9)
MCH: 31.7 pg (ref 26.6–33.0)
MCHC: 34.5 g/dL (ref 31.5–35.7)
MCV: 92 fL (ref 79–97)
Platelets: 188 10*3/uL (ref 150–450)
RBC: 3.5 x10E6/uL — ABNORMAL LOW (ref 3.77–5.28)
RDW: 11.8 % (ref 11.7–15.4)
WBC: 5.1 10*3/uL (ref 3.4–10.8)

## 2019-03-12 ENCOUNTER — Other Ambulatory Visit: Payer: Self-pay

## 2019-03-12 DIAGNOSIS — I10 Essential (primary) hypertension: Secondary | ICD-10-CM

## 2019-03-23 ENCOUNTER — Other Ambulatory Visit: Payer: Self-pay

## 2019-03-24 ENCOUNTER — Ambulatory Visit (INDEPENDENT_AMBULATORY_CARE_PROVIDER_SITE_OTHER): Payer: Medicare Other | Admitting: Family Medicine

## 2019-03-24 ENCOUNTER — Encounter: Payer: Self-pay | Admitting: Family Medicine

## 2019-03-24 VITALS — BP 132/67 | HR 66 | Temp 95.9°F | Resp 18 | Ht 61.0 in | Wt 116.0 lb

## 2019-03-24 DIAGNOSIS — K219 Gastro-esophageal reflux disease without esophagitis: Secondary | ICD-10-CM | POA: Diagnosis not present

## 2019-03-24 DIAGNOSIS — N183 Chronic kidney disease, stage 3 unspecified: Secondary | ICD-10-CM

## 2019-03-24 DIAGNOSIS — I5032 Chronic diastolic (congestive) heart failure: Secondary | ICD-10-CM | POA: Diagnosis not present

## 2019-03-24 DIAGNOSIS — J4520 Mild intermittent asthma, uncomplicated: Secondary | ICD-10-CM | POA: Diagnosis not present

## 2019-03-24 DIAGNOSIS — D509 Iron deficiency anemia, unspecified: Secondary | ICD-10-CM

## 2019-03-24 DIAGNOSIS — I480 Paroxysmal atrial fibrillation: Secondary | ICD-10-CM | POA: Diagnosis not present

## 2019-03-24 DIAGNOSIS — I10 Essential (primary) hypertension: Secondary | ICD-10-CM | POA: Diagnosis not present

## 2019-03-24 DIAGNOSIS — Z23 Encounter for immunization: Secondary | ICD-10-CM

## 2019-03-24 DIAGNOSIS — J45909 Unspecified asthma, uncomplicated: Secondary | ICD-10-CM | POA: Insufficient documentation

## 2019-03-24 LAB — CMP14+EGFR
ALT: 12 IU/L (ref 0–32)
AST: 22 IU/L (ref 0–40)
Albumin/Globulin Ratio: 1.8 (ref 1.2–2.2)
Albumin: 3.9 g/dL (ref 3.5–4.6)
Alkaline Phosphatase: 55 IU/L (ref 39–117)
BUN/Creatinine Ratio: 14 (ref 12–28)
BUN: 16 mg/dL (ref 10–36)
Bilirubin Total: 0.3 mg/dL (ref 0.0–1.2)
CO2: 27 mmol/L (ref 20–29)
Calcium: 9.2 mg/dL (ref 8.7–10.3)
Chloride: 98 mmol/L (ref 96–106)
Creatinine, Ser: 1.16 mg/dL — ABNORMAL HIGH (ref 0.57–1.00)
GFR calc Af Amer: 48 mL/min/{1.73_m2} — ABNORMAL LOW (ref >59)
GFR calc non Af Amer: 42 mL/min/{1.73_m2} — ABNORMAL LOW (ref >59)
Globulin, Total: 2.2 g/dL (ref 1.5–4.5)
Glucose: 100 mg/dL — ABNORMAL HIGH (ref 65–99)
Potassium: 4 mmol/L (ref 3.5–5.2)
Sodium: 138 mmol/L (ref 134–144)
Total Protein: 6.1 g/dL (ref 6.0–8.5)

## 2019-03-24 LAB — CBC WITH DIFFERENTIAL/PLATELET
Basophils Absolute: 0.1 10*3/uL (ref 0.0–0.2)
Basos: 2 %
EOS (ABSOLUTE): 0.2 10*3/uL (ref 0.0–0.4)
Eos: 5 %
Hematocrit: 27.9 % — ABNORMAL LOW (ref 34.0–46.6)
Hemoglobin: 9.2 g/dL — ABNORMAL LOW (ref 11.1–15.9)
Immature Grans (Abs): 0 10*3/uL (ref 0.0–0.1)
Immature Granulocytes: 0 %
Lymphocytes Absolute: 1 10*3/uL (ref 0.7–3.1)
Lymphs: 24 %
MCH: 31.8 pg (ref 26.6–33.0)
MCHC: 33 g/dL (ref 31.5–35.7)
MCV: 97 fL (ref 79–97)
Monocytes Absolute: 0.5 10*3/uL (ref 0.1–0.9)
Monocytes: 13 %
Neutrophils Absolute: 2.2 10*3/uL (ref 1.4–7.0)
Neutrophils: 56 %
Platelets: 205 10*3/uL (ref 150–450)
RBC: 2.89 x10E6/uL — ABNORMAL LOW (ref 3.77–5.28)
RDW: 13.3 % (ref 11.7–15.4)
WBC: 4 10*3/uL (ref 3.4–10.8)

## 2019-03-24 MED ORDER — AMLODIPINE BESYLATE 2.5 MG PO TABS: 2.5000 mg | ORAL_TABLET | Freq: Every day | ORAL | 1 refills | Status: DC

## 2019-03-24 MED ORDER — PANTOPRAZOLE SODIUM 40 MG PO TBEC: 40.0000 mg | DELAYED_RELEASE_TABLET | Freq: Every day | ORAL | 1 refills | Status: DC

## 2019-03-24 MED ORDER — ALBUTEROL SULFATE HFA 108 (90 BASE) MCG/ACT IN AERS: 2.0000 | INHALATION_SPRAY | RESPIRATORY_TRACT | 3 refills | Status: DC | PRN

## 2019-03-24 MED ORDER — ENALAPRIL MALEATE 2.5 MG PO TABS: 2.5000 mg | ORAL_TABLET | Freq: Every day | ORAL | 3 refills | Status: DC

## 2019-03-24 MED ORDER — FUROSEMIDE 40 MG PO TABS: 60.0000 mg | ORAL_TABLET | Freq: Every day | ORAL | 3 refills | Status: DC

## 2019-03-24 NOTE — Addendum Note (Signed)
Addended by: Baruch Gouty on: 03/24/2019 11:26 PM   Modules accepted: Orders

## 2019-03-24 NOTE — Progress Notes (Addendum)
Subjective:  Patient ID: Crystal Cooper, female    DOB: 13-Dec-1926, 85 y.o.   MRN: [id number]  Patient Care Team: Baruch Gouty, FNP as PCP - General (Family Medicine) Ander Slade, Carlisle Beers, MD as Referring Physician (Ophthalmology) Stanford Breed Denice Bors, MD as Consulting Physician (Cardiology) Constance Haw, MD as Consulting Physician (Cardiology)   Chief Complaint:  Medical Management of Chronic Issues, Anemia, Atrial Fibrillation, and Hypertension   HPI: Crystal Cooper is a 85 y.o. female presenting on 03/24/2019 for Medical Management of Chronic Issues, Anemia, Atrial Fibrillation, and Hypertension   Crystal Cooper is a 85 yo female who presents for a 3 month follow up for HTN, PAF, IDA, CKD, and CHF. She reports she is doing well. She does occasionally have chest pain that lasts for only a few minutes and resolves with rest. She states the chest pain has not worsened from what is typical for her. She sees a cardiologist and her next appointment is in December.   She has noticed that her legs feel weak sometimes, usually in the mornings after first waking. She reports she has not been able to walk the neighborhood lately due to the weather. She denies numbness, tingling, or edema. Denies myalgias or one sided weakness.    Anemia Presents for follow-up visit. Symptoms include bruises/bleeds easily. There has been no abdominal pain, anorexia, confusion, fever, leg swelling, light-headedness, palpitations, paresthesias, pica or weight loss. Signs of blood loss that are not present include hematemesis, hematochezia and melena. Past medical history includes heart failure. There are no compliance problems.  Compliance with medications is 76-100%.  Atrial Fibrillation Presents for follow-up visit. Symptoms include chest pain (intermittent), hypertension and weakness. Symptoms are negative for dizziness, hypotension, palpitations, shortness of breath and syncope. The symptoms have been stable. Past  medical history includes atrial fibrillation. There are no medication compliance problems.  Hypertension This is a chronic problem. The current episode started more than 1 year ago. The problem is controlled. Associated symptoms include chest pain (intermittent). Pertinent negatives include no blurred vision, headaches, neck pain, palpitations, peripheral edema or shortness of breath. Risk factors for coronary artery disease include post-menopausal state. Past treatments include diuretics, lifestyle changes, calcium channel blockers and beta blockers. There are no compliance problems.  Hypertensive end-organ damage includes kidney disease and heart failure.     Relevant past medical, surgical, family, and social history reviewed and updated as indicated.  Allergies and medications reviewed and updated. Date reviewed: Chart in Epic.   Past Medical History:  Diagnosis Date  . Anemia    years ago  . Aortic stenosis   . Arthritis   . Asthma   . Atrial fibrillation ([place])   . CHF (congestive heart failure) ([place]) 11/2014  . Family history of adverse reaction to anesthesia    2 daughters would have N/V  . Glaucoma   . Hearing loss   . Heart murmur   . HTN (hypertension)   . Pneumonia   . Prolapsing mitral leaflet syndrome   . Shortness of breath dyspnea    with exertion  . SVT (supraventricular tachycardia) (HCC)    S/P ablation of AVNRT in 2003    Past Surgical History:  Procedure Laterality Date  . APPENDECTOMY    . BIOPSY  04/07/2018   Procedure: BIOPSY;  Surgeon: Jerene Bears, MD;  Location: [facility] ENDOSCOPY;  Service: Gastroenterology;;  . BLADDER SURGERY    . CARDIAC CATHETERIZATION N/A 09/26/2015   Procedure: Right/Left Heart Cath  and Coronary Angiography;  Surgeon: Burnell Blanks, MD;  Location: [place] [facility] LAB;  Service: Cardiovascular;  Laterality: N/A;  . CARDIAC SURGERY    . CARDIOVERSION N/A 03/02/2016   Procedure: CARDIOVERSION;  Surgeon: Thayer Headings, MD;   Location: [place];  Service: Cardiovascular;  Laterality: N/A;  . EP IMPLANTABLE DEVICE N/A 10/26/2015   Procedure: Pacemaker Implant;  Surgeon: Will Meredith Leeds, MD;  Location: [place] [facility];  Service: Cardiovascular;  Laterality: N/A;  . ESOPHAGOGASTRODUODENOSCOPY (EGD) WITH PROPOFOL N/A 04/07/2018   Procedure: ESOPHAGOGASTRODUODENOSCOPY (EGD) WITH PROPOFOL;  Surgeon: Jerene Bears, MD;  Location: [facility] ENDOSCOPY;  Service: Gastroenterology;  Laterality: N/A;  . EYE SURGERY Bilateral    cataract surgery  . HOT HEMOSTASIS N/A 04/07/2018   Procedure: HOT HEMOSTASIS (ARGON PLASMA COAGULATION/BICAP);  Surgeon: Jerene Bears, MD;  Location: [facility] ENDOSCOPY;  Service: Gastroenterology;  Laterality: N/A;  . NASAL SINUS SURGERY    . TEE WITHOUT CARDIOVERSION N/A 10/25/2015   Procedure: TRANSESOPHAGEAL ECHOCARDIOGRAM (TEE);  Surgeon: Burnell Blanks, MD;  Location: [place];  Service: Open Heart Surgery;  Laterality: N/A;  . TRANSCATHETER AORTIC VALVE REPLACEMENT, TRANSFEMORAL Right 10/25/2015   Procedure: TRANSCATHETER AORTIC VALVE REPLACEMENT, TRANSFEMORAL;  Surgeon: Burnell Blanks, MD;  Location: [place];  Service: Open Heart Surgery;  Laterality: Right;    Social History   Socioeconomic History  . Marital status: Widowed    Spouse name: Not on file  . Number of children: 3  . Years of education: Not on file  . Highest education level: 12th grade  Occupational History  . Occupation: Retired  Scientific laboratory technician  . Financial resource strain: Not on file  . Food insecurity    Worry: Never true    Inability: Never true  . Transportation needs    Medical: No    Non-medical: No  Tobacco Use  . Smoking status: Never Smoker  . Smokeless tobacco: Never Used  Substance and Sexual Activity  . Alcohol use: No    Alcohol/week: 0.0 standard drinks  . Drug use: No  . Sexual activity: Never  Lifestyle  . Physical activity    Days per week: 0 days    Minutes per session: 0 min  .  Stress: Not at all  Relationships  . Social connections    Talks on phone: More than three times a week    Gets together: More than three times a week    Attends religious service: More than 4 times per year    Active member of club or organization: Yes    Attends meetings of clubs or organizations: More than 4 times per year    Relationship status: Widowed  . Intimate partner violence    Fear of current or ex partner: No    Emotionally abused: No    Physically abused: No    Forced sexual activity: No  Other Topics Concern  . Not on file  Social History Narrative  . Not on file    Outpatient Encounter Medications as of 03/24/2019  Medication Sig  . acetaminophen (TYLENOL) 500 MG tablet Take 500 mg by mouth every 6 (six) hours as needed for mild pain.  [name] albuterol (VENTOLIN HFA) 108 (90 Base) MCG/ACT inhaler Inhale 2 puffs into the lungs every 4 (four) hours as needed for wheezing or shortness of breath.  [name] amLODipine (NORVASC) 2.5 MG tablet Take 1 tablet (2.5 mg total) by mouth daily.  [name] apixaban (ELIQUIS) 2.5 MG TABS tablet Take 1  tablet (2.5 mg total) by mouth 2 (two) times daily.  . Biotin 10 MG CAPS Take 1 capsule by mouth daily.  . calcium-vitamin D (OSCAL WITH D) 500-200 MG-UNIT tablet Take 1 tablet by mouth daily.  [name] docusate sodium (COLACE) 100 MG capsule Take 100 mg by mouth daily as needed for mild constipation.  . enalapril (VASOTEC) 2.5 MG tablet Take 1 tablet (2.5 mg total) by mouth daily.  . ferrous sulfate 325 (65 FE) MG tablet Take 1 tablet (325 mg total) by mouth 2 (two) times daily with a meal.  . furosemide (LASIX) 40 MG tablet Take 1.5 tablets (60 mg total) by mouth daily.  . metoprolol tartrate (LOPRESSOR) 25 MG tablet Take 0.5 tablets (12.5 mg total) by mouth daily.  . multivitamin-iron-minerals-folic acid (CENTRUM) chewable tablet Chew 1 tablet by mouth daily.  . pantoprazole (PROTONIX) 40 MG tablet Take 1 tablet (40 mg total) by mouth daily at 6 (six) AM.  .  [DISCONTINUED] albuterol (PROVENTIL HFA;VENTOLIN HFA) 108 (90 Base) MCG/ACT inhaler Inhale 2 puffs into the lungs every 4 (four) hours as needed for wheezing or shortness of breath.  . [DISCONTINUED] amLODipine (NORVASC) 2.5 MG tablet Take 1 tablet (2.5 mg total) by mouth daily.  . [DISCONTINUED] enalapril (VASOTEC) 2.5 MG tablet Take 1 tablet (2.5 mg total) by mouth daily for 30 days.  . [DISCONTINUED] furosemide (LASIX) 40 MG tablet Take 1.5 tablets (60 mg total) by mouth daily.  . [DISCONTINUED] pantoprazole (PROTONIX) 40 MG tablet Take 1 tablet (40 mg total) by mouth daily at 6 (six) AM.   No facility-administered encounter medications on file as of 03/24/2019.     Allergies  Allergen Reactions  . Hctz [Hydrochlorothiazide] Other (See Comments)    Pt was ill and affected kidneys   . Codeine Other (See Comments)    Unknown  Made pt feel ill, has not had any problems since 1977    Review of Systems  Constitutional: Negative for activity change, appetite change, fever, unexpected weight change and weight loss.  HENT: Negative for nosebleeds, sore throat and trouble swallowing.   Eyes: Negative for blurred vision and visual disturbance.  Respiratory: Negative for cough and shortness of breath.   Cardiovascular: Positive for chest pain (intermittent). Negative for palpitations and syncope.  Gastrointestinal: Negative for abdominal pain, anorexia, blood in stool, constipation, diarrhea, hematemesis, hematochezia, melena, nausea and vomiting.  Genitourinary: Negative for decreased urine volume, dysuria and hematuria.  Musculoskeletal: Negative for myalgias, neck pain and neck stiffness.  Skin: Negative for rash and wound.  Neurological: Positive for weakness. Negative for dizziness, light-headedness, headaches and paresthesias.  Hematological: Bruises/bleeds easily.  Psychiatric/Behavioral: Negative for confusion.        Objective:  BP 132/67   Pulse 66   Temp (!) 95.9 F (35.5 C)    Resp 18   Ht 5' 1" (1.549 m)   Wt 116 lb (52.6 kg)   SpO2 99%   BMI 21.92 kg/m    Wt Readings from Last 3 Encounters:  03/24/19 116 lb (52.6 kg)  02/24/19 111 lb (50.3 kg)  02/17/19 113 lb (51.3 kg)    Physical Exam Constitutional:      General: She is not in acute distress.    Appearance: Normal appearance. She is normal weight. She is not ill-appearing or toxic-appearing.  HENT:     Head: Normocephalic and atraumatic.  Eyes:     Extraocular Movements: Extraocular movements intact.     Conjunctiva/sclera: Conjunctivae normal.  Pupils: Pupils are equal, round, and reactive to light.  Neck:     Musculoskeletal: Normal range of motion and neck supple. No muscular tenderness.     Vascular: No carotid bruit.  Cardiovascular:     Rate and Rhythm: Rhythm irregularly irregular.     Chest Wall: No thrill.     Pulses: Normal pulses.     Heart sounds: Normal heart sounds. No diastolic murmur. No friction rub. No gallop.   Musculoskeletal:     Right lower leg: No edema.     Left lower leg: No edema.  Neurological:     Mental Status: She is alert.     Results for orders placed or performed in visit on 02/24/19  CBC  Result Value Ref Range   WBC 5.1 3.4 - 10.8 x10E3/uL   RBC 3.50 (L) 3.77 - 5.28 x10E6/uL   Hemoglobin 11.1 11.1 - 15.9 g/dL   Hematocrit 32.2 (L) 34.0 - 46.6 %   MCV 92 79 - 97 fL   MCH 31.7 26.6 - 33.0 pg   MCHC 34.5 31.5 - 35.7 g/dL   RDW 11.8 11.7 - 15.4 %   Platelets 188 150 - 450 x10E3/uL       Pertinent labs & imaging results that were available during my care of the patient were reviewed by me and considered in my medical decision making.  Assessment & Plan:  Medina was seen today for medical management of chronic issues, anemia, atrial fibrillation and hypertension.  Diagnoses and all orders for this visit:  PAF (paroxysmal atrial fibrillation) ([place]) Labs today as below to follow hgb/hct. Will share results with cardiologist and discuss  management as needed.  Notify provider of melena, hematochezia, or hematemesis. -     CBC with Differential/Platelet  Iron deficiency anemia, unspecified iron deficiency anemia type Labs today as below. Continue eating food rich in iron and taking iron supplement.  -     CBC with Differential/Platelet  Essential hypertension BP controlled. Changes were not made in regimen. Daily blood pressure log given with instructions on how to fill out and told to bring to next visit. Goal BP 130/80. Pt aware to report any persistent high or low readings. DASH diet and exercise encouraged. Exercise at least 150 minutes per week and increase as tolerated. Goal BMI > 25. Stress management encouraged. Smoking cessation discussed. Avoid excessive alcohol. Avoid NSAID's. Avoid more than 2000 mg of sodium daily. Medications as prescribed. Follow up as scheduled.  -     amLODipine (NORVASC) 2.5 MG tablet; Take 1 tablet (2.5 mg total) by mouth daily. -     enalapril (VASOTEC) 2.5 MG tablet; Take 1 tablet (2.5 mg total) by mouth daily. -     CMP14+EGFR  CKD (chronic kidney disease), stage III (HCC) Continue medications as prescribed. Labs today as below. Avoid NSAID use.  -     enalapril (VASOTEC) 2.5 MG tablet; Take 1 tablet (2.5 mg total) by mouth daily. -     CMP14+EGFR -     CBC with Differential/Platelet  Mild intermittent reactive airway disease without complication Continue medications as prescribed. Notify provider if increasing shortness or breath or wheezing that does not improve with albuterol use.  -     albuterol (VENTOLIN HFA) 108 (90 Base) MCG/ACT inhaler; Inhale 2 puffs into the lungs every 4 (four) hours as needed for wheezing or shortness of breath.  Chronic diastolic congestive heart failure (HCC) Continue medications as prescribed. Notify provider for increases in weight,  increasing shortness or breath, persistent cough, orthopnea, or edema.  -     furosemide (LASIX) 40 MG tablet; Take 1.5  tablets (60 mg total) by mouth daily.  GERD without esophagitis No red flags present. Diet discussed. Avoid fried, spicy, fatty, greasy, and acidic foods. Avoid caffeine, nicotine, and alcohol. Do not eat 2-3 hours before bedtime and stay upright for at least 1-2 hours after eating. Eat small frequent meals. Avoid NSAID's like motrin and aleve. Medications as prescribed. Report any new or worsening symptoms. Follow up as discussed or sooner if needed.   -     pantoprazole (PROTONIX) 40 MG tablet; Take 1 tablet (40 mg total) by mouth daily at 6 (six) AM. -     CBC with Differential/Platelet  Discussed walking and leg exercises to increase muscle strength in bilateral legs. Education regarding muscle atrophy related to aging and sedentary lifestyle.   Influenza vaccination given at today's visit.   Continue all other maintenance medications.  Follow up plan: Return in about 3 months (around 06/23/2019), or if symptoms worsen or fail to improve, for A-Fib, Anemia, HTN.  Continue healthy lifestyle choices, including diet (rich in fruits, vegetables, and lean proteins, and low in salt and simple carbohydrates) and exercise (at least 30 minutes of moderate physical activity daily).  Educational handout given for health maintenance.   The above assessment and management plan was discussed with the patient. The patient verbalized understanding of and has agreed to the management plan. Patient is aware to call the clinic if they develop any new symptoms or if symptoms persist or worsen. Patient is aware when to return to the clinic for a follow-up visit. Patient educated on when it is appropriate to go to the emergency department.   Marjorie Smolder, RN, FNP student [facility] [phone]  I personally was present during the history, physical exam, and medical decision-making activities of this service and have verified that the service and findings are accurately documented  in the nurse practitioner student's note.  Monia Pouch, FNP-C [facility] [address] [place], [place] [address] [phone]

## 2019-03-24 NOTE — Patient Instructions (Signed)
DASH Eating Plan DASH stands for "Dietary Approaches to Stop Hypertension." The DASH eating plan is a healthy eating plan that has been shown to reduce high blood pressure (hypertension). Additional health benefits may include reducing the risk of type 2 diabetes mellitus, heart disease, and stroke. The DASH eating plan may also help with weight loss.  WHAT DO I NEED TO KNOW ABOUT THE DASH EATING PLAN? For the DASH eating plan, you will follow these general guidelines:  Choose foods with a percent daily value for sodium of less than 5% (as listed on the food label).  Use salt-free seasonings or herbs instead of table salt or sea salt.  Check with your health care provider or pharmacist before using salt substitutes.  Eat lower-sodium products, often labeled as "lower sodium" or "no salt added."  Eat fresh foods.  Eat more vegetables, fruits, and low-fat dairy products.  Choose whole grains. Look for the word "whole" as the first word in the ingredient list.  Choose fish and skinless chicken or turkey more often than red meat. Limit fish, poultry, and meat to 6 oz (170 g) each day.  Limit sweets, desserts, sugars, and sugary drinks.  Choose heart-healthy fats.  Limit cheese to 1 oz (28 g) per day.  Eat more home-cooked food and less restaurant, buffet, and fast food.  Limit fried foods.  Cook foods using methods other than frying.  Limit canned vegetables. If you do use them, rinse them well to decrease the sodium.  When eating at a restaurant, ask that your food be prepared with less salt, or no salt if possible.  WHAT FOODS CAN I EAT? Seek help from a dietitian for individual calorie needs.  Grains Whole grain or whole wheat bread. Brown rice. Whole grain or whole wheat pasta. Quinoa, bulgur, and whole grain cereals. Low-sodium cereals. Corn or whole wheat flour tortillas. Whole grain cornbread. Whole grain crackers. Low-sodium crackers.  Vegetables Fresh or frozen  vegetables (raw, steamed, roasted, or grilled). Low-sodium or reduced-sodium tomato and vegetable juices. Low-sodium or reduced-sodium tomato sauce and paste. Low-sodium or reduced-sodium canned vegetables.   Fruits All fresh, canned (in natural juice), or frozen fruits.  Meat and Other Protein Products Ground beef (85% or leaner), grass-fed beef, or beef trimmed of fat. Skinless chicken or turkey. Ground chicken or turkey. Pork trimmed of fat. All fish and seafood. Eggs. Dried beans, peas, or lentils. Unsalted nuts and seeds. Unsalted canned beans.  Dairy Low-fat dairy products, such as skim or 1% milk, 2% or reduced-fat cheeses, low-fat ricotta or cottage cheese, or plain low-fat yogurt. Low-sodium or reduced-sodium cheeses.  Fats and Oils Tub margarines without trans fats. Light or reduced-fat mayonnaise and salad dressings (reduced sodium). Avocado. Safflower, olive, or canola oils. Natural peanut or almond butter.  Other Unsalted popcorn and pretzels. The items listed above may not be a complete list of recommended foods or beverages. Contact your dietitian for more options.  WHAT FOODS ARE NOT RECOMMENDED?  Grains White bread. White pasta. White rice. Refined cornbread. Bagels and croissants. Crackers that contain trans fat.  Vegetables Creamed or fried vegetables. Vegetables in a cheese sauce. Regular canned vegetables. Regular canned tomato sauce and paste. Regular tomato and vegetable juices.  Fruits Dried fruits. Canned fruit in light or heavy syrup. Fruit juice.  Meat and Other Protein Products Fatty cuts of meat. Ribs, chicken wings, bacon, sausage, bologna, salami, chitterlings, fatback, hot dogs, bratwurst, and packaged luncheon meats. Salted nuts and seeds. Canned beans with salt.    Dairy Whole or 2% milk, cream, half-and-half, and cream cheese. Whole-fat or sweetened yogurt. Full-fat cheeses or blue cheese. Nondairy creamers and whipped toppings. Processed cheese,  cheese spreads, or cheese curds.  Condiments Onion and garlic salt, seasoned salt, table salt, and sea salt. Canned and packaged gravies. Worcestershire sauce. Tartar sauce. Barbecue sauce. Teriyaki sauce. Soy sauce, including reduced sodium. Steak sauce. Fish sauce. Oyster sauce. Cocktail sauce. Horseradish. Ketchup and mustard. Meat flavorings and tenderizers. Bouillon cubes. Hot sauce. Tabasco sauce. Marinades. Taco seasonings. Relishes.  Fats and Oils Butter, stick margarine, lard, shortening, ghee, and bacon fat. Coconut, palm kernel, or palm oils. Regular salad dressings.  Other Pickles and olives. Salted popcorn and pretzels.  The items listed above may not be a complete list of foods and beverages to avoid. Contact your dietitian for more information.  WHERE CAN I FIND MORE INFORMATION? National Heart, Lung, and Blood Institute: www.nhlbi.nih.gov/health/health-topics/topics/dash/ Document Released: 05/31/2011 Document Revised: 10/26/2013 Document Reviewed: 04/15/2013 ExitCare Patient Information 2015 [employer]. This information is not intended to replace advice given to you by your health care provider. Make sure you discuss any questions you have with your health care provider.   I think that you would greatly benefit from seeing a nutritionist.  If you are interested, please call Dr Sykes at [phone] to schedule an appointment.   

## 2019-04-07 ENCOUNTER — Other Ambulatory Visit: Payer: Self-pay

## 2019-04-07 ENCOUNTER — Other Ambulatory Visit: Payer: Medicare Other

## 2019-04-07 DIAGNOSIS — I480 Paroxysmal atrial fibrillation: Secondary | ICD-10-CM | POA: Diagnosis not present

## 2019-04-08 LAB — CBC
Hematocrit: 32.2 % — ABNORMAL LOW (ref 34.0–46.6)
Hemoglobin: 10.7 g/dL — ABNORMAL LOW (ref 11.1–15.9)
MCH: 31.9 pg (ref 26.6–33.0)
MCHC: 33.2 g/dL (ref 31.5–35.7)
MCV: 96 fL (ref 79–97)
Platelets: 189 10*3/uL (ref 150–450)
RBC: 3.35 x10E6/uL — ABNORMAL LOW (ref 3.77–5.28)
RDW: 13.2 % (ref 11.7–15.4)
WBC: 3.7 10*3/uL (ref 3.4–10.8)

## 2019-04-21 ENCOUNTER — Other Ambulatory Visit: Payer: Self-pay

## 2019-04-21 ENCOUNTER — Encounter: Payer: Self-pay | Admitting: Cardiology

## 2019-04-21 ENCOUNTER — Ambulatory Visit (INDEPENDENT_AMBULATORY_CARE_PROVIDER_SITE_OTHER): Payer: Medicare Other | Admitting: Cardiology

## 2019-04-21 VITALS — BP 152/78 | HR 64 | Temp 97.9°F | Ht 62.0 in | Wt 113.0 lb

## 2019-04-21 DIAGNOSIS — I10 Essential (primary) hypertension: Secondary | ICD-10-CM | POA: Diagnosis not present

## 2019-04-21 DIAGNOSIS — Z9889 Other specified postprocedural states: Secondary | ICD-10-CM | POA: Diagnosis not present

## 2019-04-21 DIAGNOSIS — D500 Iron deficiency anemia secondary to blood loss (chronic): Secondary | ICD-10-CM | POA: Diagnosis not present

## 2019-04-21 DIAGNOSIS — K264 Chronic or unspecified duodenal ulcer with hemorrhage: Secondary | ICD-10-CM

## 2019-04-21 DIAGNOSIS — Z950 Presence of cardiac pacemaker: Secondary | ICD-10-CM

## 2019-04-21 DIAGNOSIS — I5189 Other ill-defined heart diseases: Secondary | ICD-10-CM | POA: Diagnosis not present

## 2019-04-21 DIAGNOSIS — I471 Supraventricular tachycardia: Secondary | ICD-10-CM

## 2019-04-21 DIAGNOSIS — I480 Paroxysmal atrial fibrillation: Secondary | ICD-10-CM

## 2019-04-21 DIAGNOSIS — Z952 Presence of prosthetic heart valve: Secondary | ICD-10-CM | POA: Diagnosis not present

## 2019-04-21 DIAGNOSIS — N1832 Chronic kidney disease, stage 3b: Secondary | ICD-10-CM | POA: Diagnosis not present

## 2019-04-21 NOTE — Assessment & Plan Note (Signed)
DCCV Sept 2017- NSR on exam today

## 2019-04-21 NOTE — Assessment & Plan Note (Signed)
Controlled.  

## 2019-04-21 NOTE — Assessment & Plan Note (Signed)
Echo Oct 2019- grade 2 DD- no CHF on exam

## 2019-04-21 NOTE — Progress Notes (Signed)
Cardiology Office Note:    Date:  04/21/2019   ID:  Crystal Cooper, DOB February 01, 1927, MRN [id number]  PCP:  Crystal Gouty, FNP  Cardiologist:  Dr Stanford Breed Electrophysiologist:  None   Referring MD: Crystal Gouty, FNP   Chief Complaint  Patient presents with  . Follow-up    History of Present Illness:    Crystal Cooper is a 85 y.o. female with a hx of severe aortic stenosis. In May 2017 she underwent TAVR.  She had a MDT pacemaker placed at that time as well for bradycardia.  She also had PAF and underwent a DCCV in September 2017.  She was placed on Eliquis.  She presented to the emergency room 04/06/2018  profoundly anemic with a hemoglobin of 5.4.  Her Eliquis was held and she was transfused.  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 There have been a couple of attempts at resuming her Eliquis. It was resumed in Sept after he Hgb was 12.9 in Aug.  By Sept 29th her Hgb had dropped to 9.2 and she was taken off anticoagulation permanently.   She is in the office today for follow up.  Her daughter accompanied her.  The patient is frail and HOH.  She denies any unusual tachycardia or SOB.  Her most recent Hgb 04/07/2019 was up to 10.7.   Past Medical History:  Diagnosis Date  . Anemia    years ago  . Aortic stenosis   . Arthritis   . Asthma   . Atrial fibrillation ([place])   . CHF (congestive heart failure) ([place]) 11/2014  . Family history of adverse reaction to anesthesia    2 daughters would have N/V  . Glaucoma   . Hearing loss   . Heart murmur   . HTN (hypertension)   . Pneumonia   . Prolapsing mitral leaflet syndrome   . Shortness of breath dyspnea    with exertion  . SVT (supraventricular tachycardia) (HCC)    S/P ablation of AVNRT in 2003    Past Surgical History:  Procedure Laterality Date  . APPENDECTOMY    . BIOPSY  04/07/2018   Procedure: BIOPSY;  Surgeon: Jerene Bears, MD;  Location: [facility] ENDOSCOPY;  Service: Gastroenterology;;  . BLADDER SURGERY    .  CARDIAC CATHETERIZATION N/A 09/26/2015   Procedure: Right/Left Heart Cath and Coronary Angiography;  Surgeon: Burnell Blanks, MD;  Location: [place] CV LAB;  Service: Cardiovascular;  Laterality: N/A;  . CARDIAC SURGERY    . CARDIOVERSION N/A 03/02/2016   Procedure: CARDIOVERSION;  Surgeon: Thayer Headings, MD;  Location: [place];  Service: Cardiovascular;  Laterality: N/A;  . EP IMPLANTABLE DEVICE N/A 10/26/2015   Procedure: Pacemaker Implant;  Surgeon: Will Meredith Leeds, MD;  Location: [place] CV LAB;  Service: Cardiovascular;  Laterality: N/A;  . ESOPHAGOGASTRODUODENOSCOPY (EGD) WITH PROPOFOL N/A 04/07/2018   Procedure: ESOPHAGOGASTRODUODENOSCOPY (EGD) WITH PROPOFOL;  Surgeon: Jerene Bears, MD;  Location: [facility] ENDOSCOPY;  Service: Gastroenterology;  Laterality: N/A;  . EYE SURGERY Bilateral    cataract surgery  . HOT HEMOSTASIS N/A 04/07/2018   Procedure: HOT HEMOSTASIS (ARGON PLASMA COAGULATION/BICAP);  Surgeon: Jerene Bears, MD;  Location: [facility] ENDOSCOPY;  Service: Gastroenterology;  Laterality: N/A;  . NASAL SINUS SURGERY    . TEE WITHOUT CARDIOVERSION N/A 10/25/2015   Procedure: TRANSESOPHAGEAL ECHOCARDIOGRAM (TEE);  Surgeon: Burnell Blanks, MD;  Location: [place];  Service: Open Heart Surgery;  Laterality: N/A;  . TRANSCATHETER AORTIC VALVE REPLACEMENT, TRANSFEMORAL  Right 10/25/2015   Procedure: TRANSCATHETER AORTIC VALVE REPLACEMENT, TRANSFEMORAL;  Surgeon: Burnell Blanks, MD;  Location: [place];  Service: Open Heart Surgery;  Laterality: Right;    Current Medications: Current Meds  Medication Sig  . acetaminophen (TYLENOL) 500 MG tablet Take 500 mg by mouth every 6 (six) hours as needed for mild pain.  [name] albuterol (VENTOLIN HFA) 108 (90 Base) MCG/ACT inhaler Inhale 2 puffs into the lungs every 4 (four) hours as needed for wheezing or shortness of breath.  [name] amLODipine (NORVASC) 2.5 MG tablet Take 1 tablet (2.5 mg total) by mouth daily.  . Biotin 10 MG CAPS  Take 1 capsule by mouth daily.  . calcium-vitamin D (OSCAL WITH D) 500-200 MG-UNIT tablet Take 1 tablet by mouth daily.  [name] docusate sodium (COLACE) 100 MG capsule Take 100 mg by mouth daily as needed for mild constipation.  . enalapril (VASOTEC) 2.5 MG tablet Take 1 tablet (2.5 mg total) by mouth daily.  . ferrous sulfate 325 (65 FE) MG tablet Take 1 tablet (325 mg total) by mouth 2 (two) times daily with a meal.  . furosemide (LASIX) 40 MG tablet Take 1.5 tablets (60 mg total) by mouth daily.  . metoprolol tartrate (LOPRESSOR) 25 MG tablet Take 0.5 tablets (12.5 mg total) by mouth daily.  . multivitamin-iron-minerals-folic acid (CENTRUM) chewable tablet Chew 1 tablet by mouth daily.  . pantoprazole (PROTONIX) 40 MG tablet Take 1 tablet (40 mg total) by mouth daily at 6 (six) AM.     Allergies:   Hctz [hydrochlorothiazide] and Codeine   Social History   Socioeconomic History  . Marital status: Widowed    Spouse name: Not on file  . Number of children: 3  . Years of education: Not on file  . Highest education level: 12th grade  Occupational History  . Occupation: Retired  Scientific laboratory technician  . Financial resource strain: Not on file  . Food insecurity    Worry: Never true    Inability: Never true  . Transportation needs    Medical: No    Non-medical: No  Tobacco Use  . Smoking status: Never Smoker  . Smokeless tobacco: Never Used  Substance and Sexual Activity  . Alcohol use: No    Alcohol/week: 0.0 standard drinks  . Drug use: No  . Sexual activity: Never  Lifestyle  . Physical activity    Days per week: 0 days    Minutes per session: 0 min  . Stress: Not at all  Relationships  . Social connections    Talks on phone: More than three times a week    Gets together: More than three times a week    Attends religious service: More than 4 times per year    Active member of club or organization: Yes    Attends meetings of clubs or organizations: More than 4 times per year     Relationship status: Widowed  Other Topics Concern  . Not on file  Social History Narrative  . Not on file     Family History: The patient's family history includes Hypertension in her mother; Pneumonia in her father. There is no history of Heart attack, Colon cancer, or Esophageal cancer.  ROS:   Please see the history of present illness.     All other systems reviewed and are negative.  EKGs/Labs/Other Studies Reviewed:    The following studies were reviewed today:   EKG:   The ekg ordered 02/24/2019 demonstrates NSR, 1st degree AVB  Recent Labs: 11/11/2018: TSH 3.340 03/24/2019: ALT 12; BUN 16; Creatinine, Ser 1.16; Potassium 4.0; Sodium 138 04/07/2019: Hemoglobin 10.7; Platelets 189  Recent Lipid Panel    Component Value Date/Time   CHOL 146 11/11/2018 1557   TRIG 128 11/11/2018 1557   HDL 70 11/11/2018 1557   CHOLHDL 2.1 11/11/2018 1557   LDLCALC 50 11/11/2018 1557    Physical Exam:    VS:  BP (!) 152/78   Pulse 64   Temp 97.9 F (36.6 C) (Temporal)   Ht 5\' 2"  (1.575 m)   Wt 113 lb (51.3 kg)   SpO2 97%   BMI 20.67 kg/m     Wt Readings from Last 3 Encounters:  04/21/19 113 lb (51.3 kg)  03/24/19 116 lb (52.6 kg)  02/24/19 111 lb (50.3 kg)     GEN: Thin, frail female, in no acute distress HEENT: Normal HOH NECK: No JVD; No carotid bruits LYMPHATICS: No lymphadenopathy CARDIAC: RRR, no murmurs, rubs, gallops RESPIRATORY:  Clear to auscultation without rales, wheezing or rhonchi  ABDOMEN: Soft, non-tender, non-distended MUSCULOSKELETAL:  No edema; No deformity  SKIN: Warm and dry NEUROLOGIC:  Alert and oriented x 3 PSYCHIATRIC:  Normal affect   ASSESSMENT:    Anemia Recurrent anemia on Eliquis- anticoagulation has been discontinued permanently as of Sept 2020  PAF (paroxysmal atrial fibrillation) [facility]) DCCV Sept 2017- NSR on exam today  Diastolic dysfunction Echo Oct 2019- grade 2 DD- no CHF on exam  Essential  hypertension Controlled  Pacemaker Placed May 2017 for post TAVR junctional bradycardia MDT duel chamber device. Dr Curt Bears follows.  Her annual visit in June 2020 was put off secondary to [place]- will reschedule this for 2022.   S/P TAVR (transcatheter aortic valve replacement) TAVR May 2017- Her most recent echo Oct 2019 showed normal LVF with transvalvularvelocity was within the normal range. There was no stenosis. There was no regurgitation.  CKD (chronic kidney disease), stage III [id number]  PLAN:    Same Rx- check labs today-BMP and CBC.  If her Hgb is stable I suspect she won't need to continue Q 2week CBCs. I will arrange for a formal pacer check and OV after the 1st of the year.  Keep F/U with dr Stanford Breed as scheduled.    Medication Adjustments/Labs and Tests Ordered: Current medicines are reviewed at length with the patient today.  Concerns regarding medicines are outlined above.  Orders Placed This Encounter  Procedures  . CBC with Differential  . Basic Metabolic Panel (BMET)   No orders of the defined types were placed in this encounter.   Patient Instructions  Medication Instructions:  Your physician recommends that you continue on your current medications as directed. Please refer to the Current Medication list given to you today. *If you need a refill on your cardiac medications before your next appointment, please call your pharmacy*  Lab Work: Your physician recommends that you return for lab work in: TODAY-BMET, CBC If you have labs (blood work) drawn today and your tests are completely normal, you will receive your results only by: [name] MyChart Message (if you have MyChart) OR . A paper copy in the mail If you have any lab test that is abnormal or we need to change your treatment, we will call you to review the results.  Testing/Procedures: NONE  Follow-Up: At [facility], you and your health needs are our priority.  As part of our continuing mission to  provide you with exceptional heart care, we have created designated  Provider Care Teams.  These Care Teams include your primary Cardiologist (physician) and Advanced Practice Providers (APPs -  Physician Assistants and Nurse Practitioners) who all work together to provide you with the care you need, when you need it.  Your next appointment:   AS SCHEDULED 06/15/2019  The format for your next appointment:   In Person  Provider:   [name], MD  Your next appointment:   AT [place] pacer check   The format for your next appointment:   In Person  Provider:   Allegra Lai, MD   Other Instructions     Signed, Kerin Ransom, PA-C  04/21/2019 2:13 PM    [place]

## 2019-04-21 NOTE — Assessment & Plan Note (Signed)
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 Dr Curt Bears follows.  Her annual visit in June 2020 was put off secondary to [place]- will reschedule this for 2022.

## 2019-04-21 NOTE — Assessment & Plan Note (Signed)
GFR 42 

## 2019-04-21 NOTE — Assessment & Plan Note (Signed)
Recurrent anemia on Eliquis- anticoagulation has been discontinued permanently as of Sept 2020

## 2019-04-21 NOTE — Assessment & Plan Note (Signed)
TAVR May 2017- Her most recent echo Oct 2019 showed normal LVF with transvalvularvelocity was within the normal range. There was no stenosis. There was no regurgitation. 

## 2019-04-21 NOTE — Patient Instructions (Addendum)
Medication Instructions:  Your physician recommends that you continue on your current medications as directed. Please refer to the Current Medication list given to you today. *If you need a refill on your cardiac medications before your next appointment, please call your pharmacy*  Lab Work: Your physician recommends that you return for lab work in: TODAY-BMET, CBC If you have labs (blood work) drawn today and your tests are completely normal, you will receive your results only by: [name] MyChart Message (if you have MyChart) OR . A paper copy in the mail If you have any lab test that is abnormal or we need to change your treatment, we will call you to review the results.  Testing/Procedures: NONE  Follow-Up: At [facility], you and your health needs are our priority.  As part of our continuing mission to provide you with exceptional heart care, we have created designated Provider Care Teams.  These Care Teams include your primary Cardiologist (physician) and Advanced Practice Providers (APPs -  Physician Assistants and Nurse Practitioners) who all work together to provide you with the care you need, when you need it.  Your next appointment:   AS SCHEDULED 06/15/2019  The format for your next appointment:   In Person  Provider:   [name], MD  Your next appointment:   AT [place] pacer check   The format for your next appointment:   In Person  Provider:   Allegra Lai, MD   Other Instructions

## 2019-04-22 LAB — CBC WITH DIFFERENTIAL/PLATELET
Basophils Absolute: 0.1 10*3/uL (ref 0.0–0.2)
Basos: 2 %
EOS (ABSOLUTE): 0.3 10*3/uL (ref 0.0–0.4)
Eos: 7 %
Hematocrit: 36 % (ref 34.0–46.6)
Hemoglobin: 12.1 g/dL (ref 11.1–15.9)
Immature Grans (Abs): 0 10*3/uL (ref 0.0–0.1)
Immature Granulocytes: 0 %
Lymphocytes Absolute: 1 10*3/uL (ref 0.7–3.1)
Lymphs: 24 %
MCH: 32.1 pg (ref 26.6–33.0)
MCHC: 33.6 g/dL (ref 31.5–35.7)
MCV: 96 fL (ref 79–97)
Monocytes Absolute: 0.5 10*3/uL (ref 0.1–0.9)
Monocytes: 12 %
Neutrophils Absolute: 2.3 10*3/uL (ref 1.4–7.0)
Neutrophils: 55 %
Platelets: 177 10*3/uL (ref 150–450)
RBC: 3.77 x10E6/uL (ref 3.77–5.28)
RDW: 12.3 % (ref 11.7–15.4)
WBC: 4.2 10*3/uL (ref 3.4–10.8)

## 2019-04-22 LAB — BASIC METABOLIC PANEL
BUN/Creatinine Ratio: 20 (ref 12–28)
BUN: 25 mg/dL (ref 10–36)
CO2: 28 mmol/L (ref 20–29)
Calcium: 9.7 mg/dL (ref 8.7–10.3)
Chloride: 97 mmol/L (ref 96–106)
Creatinine, Ser: 1.23 mg/dL — ABNORMAL HIGH (ref 0.57–1.00)
GFR calc Af Amer: 45 mL/min/{1.73_m2} — ABNORMAL LOW (ref >59)
GFR calc non Af Amer: 39 mL/min/{1.73_m2} — ABNORMAL LOW (ref >59)
Glucose: 95 mg/dL (ref 65–99)
Potassium: 4.2 mmol/L (ref 3.5–5.2)
Sodium: 138 mmol/L (ref 134–144)

## 2019-05-12 ENCOUNTER — Telehealth: Payer: Self-pay | Admitting: Family Medicine

## 2019-05-12 NOTE — Telephone Encounter (Signed)
Patient called to cancel Prolia injection.  She has decided she no longer wants to take this injection

## 2019-05-13 ENCOUNTER — Ambulatory Visit (INDEPENDENT_AMBULATORY_CARE_PROVIDER_SITE_OTHER): Payer: Medicare Other | Admitting: *Deleted

## 2019-05-13 DIAGNOSIS — I480 Paroxysmal atrial fibrillation: Secondary | ICD-10-CM

## 2019-05-13 DIAGNOSIS — R001 Bradycardia, unspecified: Secondary | ICD-10-CM

## 2019-05-14 LAB — CUP PACEART REMOTE DEVICE CHECK
Battery Remaining Longevity: 84 mo
Battery Voltage: 3.01 V
Brady Statistic AP VP Percent: 6.04 %
Brady Statistic AP VS Percent: 12.75 %
Brady Statistic AS VP Percent: 15.18 %
Brady Statistic AS VS Percent: 66.02 %
Brady Statistic RA Percent Paced: 17.89 %
Brady Statistic RV Percent Paced: 21.19 %
Date Time Interrogation Session: 20201118095818
Implantable Lead Implant Date: 20170503
Implantable Lead Implant Date: 20170503
Implantable Lead Location: 753859
Implantable Lead Location: 753860
Implantable Lead Model: 5076
Implantable Lead Model: 5076
Implantable Pulse Generator Implant Date: 20170503
Lead Channel Impedance Value: 304 Ohm
Lead Channel Impedance Value: 361 Ohm
Lead Channel Impedance Value: 418 Ohm
Lead Channel Impedance Value: 418 Ohm
Lead Channel Pacing Threshold Amplitude: 0.875 V
Lead Channel Pacing Threshold Amplitude: 1.125 V
Lead Channel Pacing Threshold Pulse Width: 0.4 ms
Lead Channel Pacing Threshold Pulse Width: 0.4 ms
Lead Channel Sensing Intrinsic Amplitude: 3.5 mV
Lead Channel Sensing Intrinsic Amplitude: 3.5 mV
Lead Channel Sensing Intrinsic Amplitude: 9.875 mV
Lead Channel Sensing Intrinsic Amplitude: 9.875 mV
Lead Channel Setting Pacing Amplitude: 2 V
Lead Channel Setting Pacing Amplitude: 2.5 V
Lead Channel Setting Pacing Pulse Width: 0.4 ms
Lead Channel Setting Sensing Sensitivity: 2.8 mV

## 2019-05-19 ENCOUNTER — Ambulatory Visit: Payer: Medicare Other

## 2019-05-20 ENCOUNTER — Telehealth: Payer: Self-pay | Admitting: Cardiology

## 2019-05-20 DIAGNOSIS — I480 Paroxysmal atrial fibrillation: Secondary | ICD-10-CM

## 2019-05-20 MED ORDER — METOPROLOL TARTRATE 25 MG PO TABS: 25.0000 mg | ORAL_TABLET | Freq: Two times a day (BID) | ORAL | 3 refills | Status: DC

## 2019-05-20 NOTE — Telephone Encounter (Signed)
Pt has been made aware of results and recommendations. Pt is agreeable to increase Metoprolol Tart to 25 mg BID. Pt states she does not need a new Rx sent in yet. I asked her to please keep her appt with Dr. Stanford Breed on 06/15/19 @ 11 am. Pt thanked me for the call. Patient notified of result.  Please refer to phone note from today for complete details.   Julaine Hua, [place] 05/20/2019 1:41 PM

## 2019-05-20 NOTE — Telephone Encounter (Signed)
Patient returning Carol's call in regards to her device results

## 2019-05-20 NOTE — Telephone Encounter (Signed)
-----   Message from Will Meredith Leeds, MD sent at 05/14/2019  7:37 AM EST ----- Abnormal device interrogation reviewed.  Lead parameters and battery status stable.  NSVT and AF noted. Increase metoprolol to 25 mg BID

## 2019-06-10 DIAGNOSIS — H353132 Nonexudative age-related macular degeneration, bilateral, intermediate dry stage: Secondary | ICD-10-CM | POA: Diagnosis not present

## 2019-06-10 DIAGNOSIS — H401111 Primary open-angle glaucoma, right eye, mild stage: Secondary | ICD-10-CM | POA: Diagnosis not present

## 2019-06-10 DIAGNOSIS — H40002 Preglaucoma, unspecified, left eye: Secondary | ICD-10-CM | POA: Diagnosis not present

## 2019-06-10 NOTE — Progress Notes (Signed)
HPI: FU AVRand PAF. History of ablation of AVNRT in 2003. Echocardiogram repeated February 2017 and showed normal LV systolic function, severe aortic stenosis with mean gradient 43 mmHg. Cath 2/17 showed no significant CAD. Had TAVR 5/17. Had PM 5/17. Also with afib/flutter. Had DCCV 03/02/16. Echocardiogram October 2019 showed normal LV function, moderate diastolic dysfunction, prior aortic valve replacement with mean gradient 6 mmHg, mild mitral regurgitation, biatrial enlargement, mild tricuspid regurgitation and moderately elevated pulmonary pressure. Patient admitted with GI bleeding October 2019 with hemoglobin 5.4. Eliquis was held and she was transfused. 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 Patient seen by gastroenterology on August 25 and apixaban could be resumed if necessary.    However hemoglobin again decreased after resuming apixaban.  Since last seen,she denies dyspnea, chest pain, palpitations or syncope.  Current Outpatient Medications  Medication Sig Dispense Refill  . acetaminophen (TYLENOL) 500 MG tablet Take 500 mg by mouth every 6 (six) hours as needed for mild pain.    [name] albuterol (VENTOLIN HFA) 108 (90 Base) MCG/ACT inhaler Inhale 2 puffs into the lungs every 4 (four) hours as needed for wheezing or shortness of breath. 18 g 3  . amLODipine (NORVASC) 2.5 MG tablet Take 1 tablet (2.5 mg total) by mouth daily. 90 tablet 1  . Biotin 10 MG CAPS Take 1 capsule by mouth daily.    . calcium-vitamin D (OSCAL WITH D) 500-200 MG-UNIT tablet Take 1 tablet by mouth daily.    [name] docusate sodium (COLACE) 100 MG capsule Take 100 mg by mouth daily as needed for mild constipation.    . ferrous sulfate 325 (65 FE) MG tablet Take 1 tablet (325 mg total) by mouth 2 (two) times daily with a meal.  3  . furosemide (LASIX) 40 MG tablet Take 1.5 tablets (60 mg total) by mouth daily. 135 tablet 3  . metoprolol tartrate (LOPRESSOR) 25 MG tablet Take 1 tablet (25 mg total) by  mouth 2 (two) times daily. 180 tablet 3  . multivitamin-iron-minerals-folic acid (CENTRUM) chewable tablet Chew 1 tablet by mouth daily.    . pantoprazole (PROTONIX) 40 MG tablet Take 1 tablet (40 mg total) by mouth daily at 6 (six) AM. 90 tablet 1  . enalapril (VASOTEC) 2.5 MG tablet Take 1 tablet (2.5 mg total) by mouth daily. 30 tablet 3   No current facility-administered medications for this visit.     Past Medical History:  Diagnosis Date  . Anemia    years ago  . Aortic stenosis   . Arthritis   . Asthma   . Atrial fibrillation ([place])   . CHF (congestive heart failure) ([place]) 11/2014  . Family history of adverse reaction to anesthesia    2 daughters would have N/V  . Glaucoma   . Hearing loss   . Heart murmur   . HTN (hypertension)   . Pneumonia   . Prolapsing mitral leaflet syndrome   . Shortness of breath dyspnea    with exertion  . SVT (supraventricular tachycardia) (HCC)    S/P ablation of AVNRT in 2003    Past Surgical History:  Procedure Laterality Date  . APPENDECTOMY    . BIOPSY  04/07/2018   Procedure: BIOPSY;  Surgeon: Jerene Bears, MD;  Location: [facility] ENDOSCOPY;  Service: Gastroenterology;;  . BLADDER SURGERY    . CARDIAC CATHETERIZATION N/A 09/26/2015   Procedure: Right/Left Heart Cath and Coronary Angiography;  Surgeon: Burnell Blanks, MD;  Location: [place]  CV LAB;  Service: Cardiovascular;  Laterality: N/A;  . CARDIAC SURGERY    . CARDIOVERSION N/A 03/02/2016   Procedure: CARDIOVERSION;  Surgeon: Thayer Headings, MD;  Location: [place];  Service: Cardiovascular;  Laterality: N/A;  . EP IMPLANTABLE DEVICE N/A 10/26/2015   Procedure: Pacemaker Implant;  Surgeon: Will Meredith Leeds, MD;  Location: [place] CV LAB;  Service: Cardiovascular;  Laterality: N/A;  . ESOPHAGOGASTRODUODENOSCOPY (EGD) WITH PROPOFOL N/A 04/07/2018   Procedure: ESOPHAGOGASTRODUODENOSCOPY (EGD) WITH PROPOFOL;  Surgeon: Jerene Bears, MD;  Location: [facility] ENDOSCOPY;  Service:  Gastroenterology;  Laterality: N/A;  . EYE SURGERY Bilateral    cataract surgery  . HOT HEMOSTASIS N/A 04/07/2018   Procedure: HOT HEMOSTASIS (ARGON PLASMA COAGULATION/BICAP);  Surgeon: Jerene Bears, MD;  Location: [facility] ENDOSCOPY;  Service: Gastroenterology;  Laterality: N/A;  . NASAL SINUS SURGERY    . TEE WITHOUT CARDIOVERSION N/A 10/25/2015   Procedure: TRANSESOPHAGEAL ECHOCARDIOGRAM (TEE);  Surgeon: Burnell Blanks, MD;  Location: [place];  Service: Open Heart Surgery;  Laterality: N/A;  . TRANSCATHETER AORTIC VALVE REPLACEMENT, TRANSFEMORAL Right 10/25/2015   Procedure: TRANSCATHETER AORTIC VALVE REPLACEMENT, TRANSFEMORAL;  Surgeon: Burnell Blanks, MD;  Location: [place];  Service: Open Heart Surgery;  Laterality: Right;    Social History   Socioeconomic History  . Marital status: Widowed    Spouse name: Not on file  . Number of children: 3  . Years of education: Not on file  . Highest education level: 12th grade  Occupational History  . Occupation: Retired  Tobacco Use  . Smoking status: Never Smoker  . Smokeless tobacco: Never Used  Substance and Sexual Activity  . Alcohol use: No    Alcohol/week: 0.0 standard drinks  . Drug use: No  . Sexual activity: Never  Other Topics Concern  . Not on file  Social History Narrative  . Not on file   Social Determinants of Health   Financial Resource Strain:   . Difficulty of Paying Living Expenses: Not on file  Food Insecurity: No Food Insecurity  . Worried About Charity fundraiser in the Last Year: Never true  . Ran Out of Food in the Last Year: Never true  Transportation Needs: No Transportation Needs  . Lack of Transportation (Medical): No  . Lack of Transportation (Non-Medical): No  Physical Activity: Inactive  . Days of Exercise per Week: 0 days  . Minutes of Exercise per Session: 0 min  Stress: No Stress Concern Present  . Feeling of Stress : Not at all  Social Connections: Slightly Isolated  . Frequency of  Communication with Friends and Family: More than three times a week  . Frequency of Social Gatherings with Friends and Family: More than three times a week  . Attends Religious Services: More than 4 times per year  . Active Member of Clubs or Organizations: Yes  . Attends Archivist Meetings: More than 4 times per year  . Marital Status: Widowed  Intimate Partner Violence: Not At Risk  . Fear of Current or Ex-Partner: No  . Emotionally Abused: No  . Physically Abused: No  . Sexually Abused: No    Family History  Problem Relation Age of Onset  . Hypertension Mother   . Pneumonia Father   . Heart attack Neg Hx   . Colon cancer Neg Hx   . Esophageal cancer Neg Hx     ROS: no fevers or chills, productive cough, hemoptysis, dysphasia, odynophagia, melena, hematochezia, dysuria, hematuria, rash,  seizure activity, orthopnea, PND, pedal edema, claudication. Remaining systems are negative.  Physical Exam: Well-developed well-nourished in no acute distress.  Skin is warm and dry.  HEENT is normal.  Neck is supple.  Chest is clear to auscultation with normal expansion.  Cardiovascular exam is regular rate and rhythm.  1/6 systolic murmur left sternal border.  No diastolic murmur. Abdominal exam nontender or distended. No masses palpated. Extremities show no edema. neuro grossly intact   A/P  1 paroxysmal atrial fibrillation-at previous office visit we resumed her Eliquis again.  However her hemoglobin again decreased and this medication was discontinued.  We will therefore not resume.  Continue beta-blocker.  2 prior TAVR-continue SBE prophylaxis.  Repeat echocardiogram.  3 hypertension-blood pressure controlled.  Continue present medications and follow.  4 chronic diastolic congestive heart failure-she appears to be euvolemic.  Continue present dose of diuretic.  5 prior pacemaker-followed by electrophysiology.  [name], MD

## 2019-06-11 NOTE — Progress Notes (Signed)
Remote pacemaker transmission.   

## 2019-06-15 ENCOUNTER — Other Ambulatory Visit: Payer: Self-pay

## 2019-06-15 ENCOUNTER — Ambulatory Visit (INDEPENDENT_AMBULATORY_CARE_PROVIDER_SITE_OTHER): Payer: Medicare Other | Admitting: Cardiology

## 2019-06-15 ENCOUNTER — Encounter: Payer: Self-pay | Admitting: Cardiology

## 2019-06-15 VITALS — BP 142/70 | HR 64 | Temp 97.2°F | Ht 62.0 in | Wt 109.0 lb

## 2019-06-15 DIAGNOSIS — I359 Nonrheumatic aortic valve disorder, unspecified: Secondary | ICD-10-CM | POA: Diagnosis not present

## 2019-06-15 DIAGNOSIS — I10 Essential (primary) hypertension: Secondary | ICD-10-CM | POA: Diagnosis not present

## 2019-06-15 DIAGNOSIS — I480 Paroxysmal atrial fibrillation: Secondary | ICD-10-CM

## 2019-06-15 DIAGNOSIS — Z952 Presence of prosthetic heart valve: Secondary | ICD-10-CM

## 2019-06-15 NOTE — Patient Instructions (Signed)
Medication Instructions:  NO CHANGES  *If you need a refill on your cardiac medications before your next appointment, please call your pharmacy*  Lab Work: NONE NEEDED  If you have labs (blood work) drawn today and your tests are completely normal, you will receive your results only by: [name] MyChart Message (if you have MyChart) OR . A paper copy in the mail If you have any lab test that is abnormal or we need to change your treatment, we will call you to review the results.  Testing/Procedures: Your physician has requested that you have an echocardiogram. Echocardiography is a painless test that uses sound waves to create images of your heart. It provides your doctor with information about the size and shape of your heart and how well your heart's chambers and valves are working. This procedure takes approximately one hour. There are no restrictions for this procedure. [place]   Follow-Up: At [facility], you and your health needs are our priority.  As part of our continuing mission to provide you with exceptional heart care, we have created designated Provider Care Teams.  These Care Teams include your primary Cardiologist (physician) and Advanced Practice Providers (APPs -  Physician Assistants and Nurse Practitioners) who all work together to provide you with the care you need, when you need it.  Your next appointment:   6 month(s)  The format for your next appointment:   In Person  Provider:   You may see Dr. Stanford Breed or one of the following Advanced Practice Providers on your designated Care Team:    Kerin Ransom, PA-C  [place], [place]  Coletta Memos, [place]

## 2019-06-23 ENCOUNTER — Other Ambulatory Visit: Payer: Self-pay

## 2019-06-24 ENCOUNTER — Ambulatory Visit (INDEPENDENT_AMBULATORY_CARE_PROVIDER_SITE_OTHER): Payer: Medicare Other | Admitting: Family Medicine

## 2019-06-24 ENCOUNTER — Encounter: Payer: Self-pay | Admitting: Family Medicine

## 2019-06-24 VITALS — BP 145/69 | HR 60 | Temp 97.1°F | Resp 20 | Ht 62.0 in | Wt 112.0 lb

## 2019-06-24 DIAGNOSIS — I10 Essential (primary) hypertension: Secondary | ICD-10-CM | POA: Diagnosis not present

## 2019-06-24 DIAGNOSIS — N1832 Chronic kidney disease, stage 3b: Secondary | ICD-10-CM | POA: Diagnosis not present

## 2019-06-24 DIAGNOSIS — K219 Gastro-esophageal reflux disease without esophagitis: Secondary | ICD-10-CM

## 2019-06-24 DIAGNOSIS — I480 Paroxysmal atrial fibrillation: Secondary | ICD-10-CM

## 2019-06-24 DIAGNOSIS — H6123 Impacted cerumen, bilateral: Secondary | ICD-10-CM | POA: Diagnosis not present

## 2019-06-24 MED ORDER — ENALAPRIL MALEATE 2.5 MG PO TABS: 2.5000 mg | ORAL_TABLET | Freq: Every day | ORAL | 2 refills | Status: DC

## 2019-06-24 NOTE — Progress Notes (Signed)
Subjective:  Patient ID: Crystal Cooper, female    DOB: 1926-08-12, 85 y.o.   MRN: [id number]  Patient Care Team: Baruch Gouty, FNP as PCP - General (Family Medicine) Ander Slade, Carlisle Beers, MD as Referring Physician (Ophthalmology) Stanford Breed Denice Bors, MD as Consulting Physician (Cardiology) Constance Haw, MD as Consulting Physician (Cardiology)   Chief Complaint:  Medical Management of Chronic Issues (3 mo ), Hypertension, and Atrial Fibrillation   HPI: Crystal Cooper is a 85 y.o. female presenting on 06/24/2019 for Medical Management of Chronic Issues (3 mo ), Hypertension, and Atrial Fibrillation   1. PAF (paroxysmal atrial fibrillation) (HCC) Recently saw Dr. Stanford Breed and is scheduled for an echo and pacemaker check. No chest pain, palpitations, shortness of breath, dizziness, or syncope. Reports she feels fine since the discontinuation of Eliquis. She remains on beta-blocker.   2. Essential hypertension Complaint with meds - Yes Current Medications - Norvasc, Vasotec, Lasix, Lopressor Checking BP at home - No Exercising Regularly - No Watching Salt intake - Yes Pertinent ROS:  Headache - No Fatigue - No Visual Disturbances - No Chest pain - No Dyspnea - No Palpitations - No LE edema - minimal at times They report good compliance with medications and can restate their regimen by memory. No medication side effects.  Family, social, and smoking history reviewed.   BP Readings from Last 3 Encounters:  06/24/19 (!) 145/69  06/15/19 (!) 142/70  04/21/19 (!) 152/78   CMP Latest Ref Rng & Units 04/21/2019 03/24/2019 12/22/2018  Glucose 65 - 99 mg/dL 95 100(H) 86  BUN 10 - 36 mg/dL 25 16 17   Creatinine 0.57 - 1.00 mg/dL 1.23(H) 1.16(H) 1.19(H)  Sodium 134 - 144 mmol/L 138 138 134  Potassium 3.5 - 5.2 mmol/L 4.2 4.0 4.7  Chloride 96 - 106 mmol/L 97 98 96  CO2 20 - 29 mmol/L 28 27 26   Calcium 8.7 - 10.3 mg/dL 9.7 9.2 9.3  Total Protein 6.0 - 8.5 g/dL - 6.1 6.7    Total Bilirubin 0.0 - 1.2 mg/dL - 0.3 0.6  Alkaline Phos 39 - 117 IU/L - 55 55  AST 0 - 40 IU/L - 22 23  ALT 0 - 32 IU/L - 12 14      3. Stage 3b chronic kidney disease Denies decreased urine output or increased swelling. No confusion or weakness.   4. GERD without esophagitis Compliant with medications - Yes Current medications - pantoprazole Adverse side effects - No Cough - No Sore throat - No Voice change - No Hemoptysis - No Dysphagia or dyspepsia - No Water brash - No Red Flags (weight loss, hematochezia, melena, weight loss, early satiety, fevers, odynophagia, or persistent vomiting) - No    Relevant past medical, surgical, family, and social history reviewed and updated as indicated.  Allergies and medications reviewed and updated. Date reviewed: Chart in Epic.   Past Medical History:  Diagnosis Date  . Anemia    years ago  . Aortic stenosis   . Arthritis   . Asthma   . Atrial fibrillation ([place])   . CHF (congestive heart failure) ([place]) 11/2014  . Family history of adverse reaction to anesthesia    2 daughters would have N/V  . Glaucoma   . Hearing loss   . Heart murmur   . HTN (hypertension)   . Pneumonia   . Prolapsing mitral leaflet syndrome   . Shortness of breath dyspnea    with exertion  . SVT (  supraventricular tachycardia) ([place])    S/P ablation of AVNRT in 2003    Past Surgical History:  Procedure Laterality Date  . APPENDECTOMY    . BIOPSY  04/07/2018   Procedure: BIOPSY;  Surgeon: Jerene Bears, MD;  Location: [facility] ENDOSCOPY;  Service: Gastroenterology;;  . BLADDER SURGERY    . CARDIAC CATHETERIZATION N/A 09/26/2015   Procedure: Right/Left Heart Cath and Coronary Angiography;  Surgeon: Burnell Blanks, MD;  Location: [place] [facility];  Service: Cardiovascular;  Laterality: N/A;  . CARDIAC SURGERY    . CARDIOVERSION N/A 03/02/2016   Procedure: CARDIOVERSION;  Surgeon: Thayer Headings, MD;  Location: [place];  Service:  Cardiovascular;  Laterality: N/A;  . EP IMPLANTABLE DEVICE N/A 10/26/2015   Procedure: Pacemaker Implant;  Surgeon: Will Meredith Leeds, MD;  Location: [place] [facility];  Service: Cardiovascular;  Laterality: N/A;  . ESOPHAGOGASTRODUODENOSCOPY (EGD) WITH PROPOFOL N/A 04/07/2018   Procedure: ESOPHAGOGASTRODUODENOSCOPY (EGD) WITH PROPOFOL;  Surgeon: Jerene Bears, MD;  Location: [facility] ENDOSCOPY;  Service: Gastroenterology;  Laterality: N/A;  . EYE SURGERY Bilateral    cataract surgery  . HOT HEMOSTASIS N/A 04/07/2018   Procedure: HOT HEMOSTASIS (ARGON PLASMA COAGULATION/BICAP);  Surgeon: Jerene Bears, MD;  Location: [facility] ENDOSCOPY;  Service: Gastroenterology;  Laterality: N/A;  . NASAL SINUS SURGERY    . TEE WITHOUT CARDIOVERSION N/A 10/25/2015   Procedure: TRANSESOPHAGEAL ECHOCARDIOGRAM (TEE);  Surgeon: Burnell Blanks, MD;  Location: [place];  Service: Open Heart Surgery;  Laterality: N/A;  . TRANSCATHETER AORTIC VALVE REPLACEMENT, TRANSFEMORAL Right 10/25/2015   Procedure: TRANSCATHETER AORTIC VALVE REPLACEMENT, TRANSFEMORAL;  Surgeon: Burnell Blanks, MD;  Location: [place];  Service: Open Heart Surgery;  Laterality: Right;    Social History   Socioeconomic History  . Marital status: Widowed    Spouse name: Not on file  . Number of children: 3  . Years of education: Not on file  . Highest education level: 12th grade  Occupational History  . Occupation: Retired  Tobacco Use  . Smoking status: Never Smoker  . Smokeless tobacco: Never Used  Substance and Sexual Activity  . Alcohol use: No    Alcohol/week: 0.0 standard drinks  . Drug use: No  . Sexual activity: Never  Other Topics Concern  . Not on file  Social History Narrative  . Not on file   Social Determinants of Health   Financial Resource Strain:   . Difficulty of Paying Living Expenses: Not on file  Food Insecurity: No Food Insecurity  . Worried About Charity fundraiser in the Last Year: Never true  . Ran Out of  Food in the Last Year: Never true  Transportation Needs: No Transportation Needs  . Lack of Transportation (Medical): No  . Lack of Transportation (Non-Medical): No  Physical Activity: Inactive  . Days of Exercise per Week: 0 days  . Minutes of Exercise per Session: 0 min  Stress: No Stress Concern Present  . Feeling of Stress : Not at all  Social Connections: Slightly Isolated  . Frequency of Communication with Friends and Family: More than three times a week  . Frequency of Social Gatherings with Friends and Family: More than three times a week  . Attends Religious Services: More than 4 times per year  . Active Member of Clubs or Organizations: Yes  . Attends Archivist Meetings: More than 4 times per year  . Marital Status: Widowed  Intimate Partner Violence: Not At Risk  . Fear  of Current or Ex-Partner: No  . Emotionally Abused: No  . Physically Abused: No  . Sexually Abused: No    Outpatient Encounter Medications as of 06/24/2019  Medication Sig  . acetaminophen (TYLENOL) 500 MG tablet Take 500 mg by mouth every 6 (six) hours as needed for mild pain.  [name] albuterol (VENTOLIN HFA) 108 (90 Base) MCG/ACT inhaler Inhale 2 puffs into the lungs every 4 (four) hours as needed for wheezing or shortness of breath.  [name] amLODipine (NORVASC) 2.5 MG tablet Take 1 tablet (2.5 mg total) by mouth daily.  . Biotin 10 MG CAPS Take 1 capsule by mouth daily.  . calcium-vitamin D (OSCAL WITH D) 500-200 MG-UNIT tablet Take 1 tablet by mouth daily.  [name] docusate sodium (COLACE) 100 MG capsule Take 100 mg by mouth daily as needed for mild constipation.  . enalapril (VASOTEC) 2.5 MG tablet Take 1 tablet (2.5 mg total) by mouth daily.  . ferrous sulfate 325 (65 FE) MG tablet Take 1 tablet (325 mg total) by mouth 2 (two) times daily with a meal.  . furosemide (LASIX) 40 MG tablet Take 1.5 tablets (60 mg total) by mouth daily.  . metoprolol tartrate (LOPRESSOR) 25 MG tablet Take 1 tablet (25 mg  total) by mouth 2 (two) times daily.  . multivitamin-iron-minerals-folic acid (CENTRUM) chewable tablet Chew 1 tablet by mouth daily.  . pantoprazole (PROTONIX) 40 MG tablet Take 1 tablet (40 mg total) by mouth daily at 6 (six) AM.  . [DISCONTINUED] enalapril (VASOTEC) 2.5 MG tablet Take 1 tablet (2.5 mg total) by mouth daily.   No facility-administered encounter medications on file as of 06/24/2019.    Allergies  Allergen Reactions  . Hctz [Hydrochlorothiazide] Other (See Comments)    Pt was ill and affected kidneys   . Codeine Other (See Comments)    Unknown  Made pt feel ill, has not had any problems since 1977    Review of Systems  Constitutional: Negative for activity change, appetite change, chills, diaphoresis, fatigue, fever and unexpected weight change.  HENT: Positive for hearing loss.   Eyes: Negative.  Negative for photophobia and visual disturbance.  Respiratory: Negative for cough, chest tightness and shortness of breath.   Cardiovascular: Negative for chest pain, palpitations and leg swelling.  Gastrointestinal: Negative for abdominal pain, blood in stool, constipation, diarrhea, nausea and vomiting.  Endocrine: Negative.   Genitourinary: Negative for decreased urine volume, difficulty urinating, dysuria, frequency and urgency.  Musculoskeletal: Negative for arthralgias and myalgias.  Skin: Negative.   Allergic/Immunologic: Negative.   Neurological: Negative for dizziness, tremors, seizures, syncope, facial asymmetry, speech difficulty, weakness, light-headedness, numbness and headaches.  Hematological: Negative.   Psychiatric/Behavioral: Negative for confusion, hallucinations, sleep disturbance and suicidal ideas.  All other systems reviewed and are negative.       Objective:  BP (!) 145/69 (BP Location: Right Arm, Cuff Size: Normal)   Pulse 60   Temp (!) 97.1 F (36.2 C)   Resp 20   Ht 5' 2"  (1.575 m)   Wt 112 lb (50.8 kg)   SpO2 98%   BMI 20.49 kg/m     Wt Readings from Last 3 Encounters:  06/24/19 112 lb (50.8 kg)  06/15/19 109 lb (49.4 kg)  04/21/19 113 lb (51.3 kg)    Physical Exam Vitals and nursing note reviewed.  Constitutional:      General: She is not in acute distress.    Appearance: Normal appearance. She is well-developed and well-groomed. She is not ill-appearing, toxic-appearing  or diaphoretic.  HENT:     Head: Normocephalic and atraumatic.     Jaw: There is normal jaw occlusion.     Right Ear: Decreased hearing noted. There is impacted cerumen.     Left Ear: Decreased hearing noted. There is impacted cerumen.     Nose: Nose normal.     Mouth/Throat:     Lips: Pink.     Mouth: Mucous membranes are moist.     Pharynx: Oropharynx is clear. Uvula midline.  Eyes:     General: Lids are normal.     Extraocular Movements: Extraocular movements intact.     Conjunctiva/sclera: Conjunctivae normal.     Pupils: Pupils are equal, round, and reactive to light.  Neck:     Thyroid: No thyroid mass, thyromegaly or thyroid tenderness.     Vascular: No carotid bruit or JVD.     Trachea: Trachea and phonation normal.  Cardiovascular:     Rate and Rhythm: Normal rate and regular rhythm.     Chest Wall: PMI is not displaced.     Pulses: Normal pulses.     Heart sounds: Murmur present. Systolic murmur present with a grade of 1/6. No friction rub. No gallop.   Pulmonary:     Effort: Pulmonary effort is normal. No respiratory distress.     Breath sounds: Normal breath sounds. No wheezing.  Abdominal:     General: Bowel sounds are normal. There is no distension or abdominal bruit.     Palpations: Abdomen is soft. There is no hepatomegaly or splenomegaly.     Tenderness: There is no abdominal tenderness. There is no right CVA tenderness or left CVA tenderness.     Hernia: No hernia is present.  Musculoskeletal:        General: Normal range of motion.     Cervical back: Normal range of motion and neck supple.     Right lower leg:  No edema.     Left lower leg: No edema.  Lymphadenopathy:     Cervical: No cervical adenopathy.  Skin:    General: Skin is warm and dry.     Capillary Refill: Capillary refill takes less than 2 seconds.     Coloration: Skin is not cyanotic, jaundiced or pale.     Findings: No rash.  Neurological:     General: No focal deficit present.     Mental Status: She is alert and oriented to person, place, and time.     Cranial Nerves: Cranial nerves are intact.     Sensory: Sensation is intact.     Motor: Motor function is intact.     Coordination: Coordination is intact.     Gait: Gait abnormal (uses cane).     Deep Tendon Reflexes: Reflexes are normal and symmetric.  Psychiatric:        Attention and Perception: Attention and perception normal.        Mood and Affect: Mood and affect normal.        Speech: Speech normal.        Behavior: Behavior normal. Behavior is cooperative.        Thought Content: Thought content normal.        Cognition and Memory: Cognition and memory normal.        Judgment: Judgment normal.     Results for orders placed or performed in visit on 05/13/19  [facility]  Result Value Ref Range   Date Time Interrogation Session [phone]  Pulse Generator Manufacturer MERM    Pulse Gen Model [id number] Advisa DR MRI    Pulse Gen Serial Number [id number]    Clinic Name [facility] Pulse Generator Type Implantable Pulse Generator    Implantable Pulse Generator Implant Date [id number]    Implantable Lead Manufacturer MERM    Implantable Lead Model 5076 CapSureFix Novus MRI SureScan    Implantable Lead Serial Number [id number]    Implantable Lead Implant Date [id number]    Implantable Lead Location Detail 1 UNKNOWN    Implantable Lead Location [id number]    Implantable Lead Manufacturer MERM    Implantable Lead Model 5076 CapSureFix Novus MRI SureScan    Implantable Lead Serial Number [id number]    Implantable Lead Implant Date  [id number]    Implantable Lead Location Detail 1 UNKNOWN    Implantable Lead Location [phone]    Lead Channel Setting Sensing Sensitivity 2.8 mV   Lead Channel Setting Pacing Amplitude 2 V   Lead Channel Setting Pacing Pulse Width 0.4 ms   Lead Channel Setting Pacing Amplitude 2.5 V   Lead Channel Impedance Value 418 ohm   Lead Channel Impedance Value 304 ohm   Lead Channel Sensing Intrinsic Amplitude 3.5 mV   Lead Channel Sensing Intrinsic Amplitude 3.5 mV   Lead Channel Pacing Threshold Amplitude 0.875 V   Lead Channel Pacing Threshold Pulse Width 0.4 ms   Lead Channel Impedance Value 418 ohm   Lead Channel Impedance Value 361 ohm   Lead Channel Sensing Intrinsic Amplitude 9.875 mV   Lead Channel Sensing Intrinsic Amplitude 9.875 mV   Lead Channel Pacing Threshold Amplitude 1.125 V   Lead Channel Pacing Threshold Pulse Width 0.4 ms   Battery Status OK    Battery Remaining Longevity 84 mo   Battery Voltage 3.01 V   Brady Statistic RA Percent Paced 17.89 %   Brady Statistic RV Percent Paced 21.19 %   Brady Statistic AP VP Percent 6.04 %   Brady Statistic AS VP Percent 15.18 %   Brady Statistic AP VS Percent 12.75 %   Brady Statistic AS VS Percent 66.02 %     Ear Cerumen Removal  Date/Time: 06/24/2019 11:00 AM Performed by: Baruch Gouty, FNP Authorized by: Baruch Gouty, FNP   Anesthesia: Local Anesthetic: none Location details: right ear and left ear Patient tolerance: patient tolerated the procedure well with no immediate complications Comments: Bilateral TM pearly gray without erythema, bulging, or perforation post irrigation.  Procedure type: irrigation  Sedation: Patient sedated: no       Pertinent labs & imaging results that were available during my care of the patient were reviewed by me and considered in my medical decision making.  Assessment & Plan:  Deicy was seen today for medical management of chronic issues, hypertension and atrial  fibrillation.  Diagnoses and all orders for this visit:  PAF (paroxysmal atrial fibrillation) ([place]) Doing well off of Eliquis. Remains on BB. No palpitations, chest pain, dizziness, shortness of breath, or syncope. Followed on a regular basis by cardiology. Will keep follow up appointments.   Essential hypertension BP fairly controlled. Changes were not made in regimen today. Goal BP is 130/80. Pt aware to report any persistent high or low readings. DASH diet and exercise encouraged. Exercise at least 150 minutes per week and increase as tolerated. Goal BMI > 25. Stress management encouraged. Avoid nicotine and tobacco product use. Avoid excessive alcohol and NSAID's. Avoid more than 2000 mg of sodium daily.  Medications as prescribed. Follow up as scheduled.  -     CBC with Differential/Platelet -     CMP14+EGFR -     enalapril (VASOTEC) 2.5 MG tablet; Take 1 tablet (2.5 mg total) by mouth daily.  Stage 3b chronic kidney disease Labs pending. Will continue enalapril for renal protection.  -     CMP14+EGFR -     enalapril (VASOTEC) 2.5 MG tablet; Take 1 tablet (2.5 mg total) by mouth daily.  GERD without esophagitis No red flags present. Diet discussed. Avoid fried, spicy, fatty, greasy, and acidic foods. Avoid caffeine, nicotine, and alcohol. Do not eat 2-3 hours before bedtime and stay upright for at least 1-2 hours after eating. Eat small frequent meals. Avoid NSAID's like motrin and aleve. Medications as prescribed. Report any new or worsening symptoms. Follow up as discussed or sooner if needed.   -     CBC with Differential/Platelet  Bilateral impacted cerumen Cerumen removal in office today. Pt tolerated well and reports improved hearing post procedure.  -     Ear Cerumen Removal     Continue all other maintenance medications.  Follow up plan: Return in about 3 months (around 09/22/2019), or if symptoms worsen or fail to improve.  Continue healthy lifestyle choices, including  diet (rich in fruits, vegetables, and lean proteins, and low in salt and simple carbohydrates) and exercise (at least 30 minutes of moderate physical activity daily).  Educational handout given for survey, COVID-19  The above assessment and management plan was discussed with the patient. The patient verbalized understanding of and has agreed to the management plan. Patient is aware to call the clinic if they develop any new symptoms or if symptoms persist or worsen. Patient is aware when to return to the clinic for a follow-up visit. Patient educated on when it is appropriate to go to the emergency department.   Monia Pouch, FNP-C [facility] [phone]

## 2019-06-24 NOTE — Patient Instructions (Addendum)
It was a pleasure seeing you today, Crystal Cooper.  Information regarding what we discussed is included in this packet.  Please make an appointment to see me in 3 months.    If you had labs performed today, you will be contacted with the abnormal results once they are available, usually in the next 3 business days for routine lab work.  If you had STI testing, a pap smear, or a biopsy performed, expect to be contacted in about 7-10 days. If results are normal, you will not be notified.    In a few days you may receive a survey in the mail or online from [employer] regarding your visit with [place] today. Please take a moment to fill this out. Your feedback is very important to our office. It can help [place] better understand your needs as well as improve your experience and satisfaction. Thank you for taking your time to complete it. We care about you.  Because of recent events of COVID-19 ("Coronavirus"), please follow CDC recommendations:   1. Wash your hand frequently 2. Avoid touching your face 3. Stay away from people who are sick 4. If you have symptoms such as fever, cough, shortness of breath then call your healthcare provider for further guidance 5. If you are sick, STAY AT HOME, unless otherwise directed by your healthcare provider. 6. Follow directions from state and national officials regarding staying safe    Please feel free to call our office if any questions or concerns arise.  Warm Regards, Monia Pouch, FNP-C [place] [address] [place], [place] [address] [phone]

## 2019-06-24 NOTE — Progress Notes (Deleted)
Subjective:  Patient ID: Crystal Cooper, female    DOB: [birth year], 85 y.o.   MRN: [id number]  Patient Care Team: Baruch Gouty, FNP as PCP - General (Family Medicine) Ander Slade, Carlisle Beers, MD as Referring Physician (Ophthalmology) Stanford Breed Denice Bors, MD as Consulting Physician (Cardiology) Constance Haw, MD as Consulting Physician (Cardiology)   Chief Complaint:  Medical Management of Chronic Issues (3 mo ), Hypertension, and Atrial Fibrillation   HPI: Crystal Cooper is a 85 y.o. female presenting on 06/24/2019 for Medical Management of Chronic Issues (3 mo ), Hypertension, and Atrial Fibrillation   HPI 1. PAF (paroxysmal atrial fibrillation) (HCC) ***  2. Essential hypertension ***  3. Stage 3b chronic kidney disease ***  4. GERD without esophagitis ***  5. Right ear impacted cerumen ***  6. CKD (chronic kidney disease), stage III ***     Relevant past medical, surgical, family, and social history reviewed and updated as indicated.  Allergies and medications reviewed and updated. Date reviewed: Chart in Epic.   Past Medical History:  Diagnosis Date  . Anemia    years ago  . Aortic stenosis   . Arthritis   . Asthma   . Atrial fibrillation ([place])   . CHF (congestive heart failure) ([place]) 11/2014  . Family history of adverse reaction to anesthesia    2 daughters would have N/V  . Glaucoma   . Hearing loss   . Heart murmur   . HTN (hypertension)   . Pneumonia   . Prolapsing mitral leaflet syndrome   . Shortness of breath dyspnea    with exertion  . SVT (supraventricular tachycardia) (HCC)    S/P ablation of AVNRT in 2003    Past Surgical History:  Procedure Laterality Date  . APPENDECTOMY    . BIOPSY  04/07/2018   Procedure: BIOPSY;  Surgeon: Jerene Bears, MD;  Location: [facility] ENDOSCOPY;  Service: Gastroenterology;;  . BLADDER SURGERY    . CARDIAC CATHETERIZATION N/A 09/26/2015   Procedure: Right/Left Heart Cath and Coronary Angiography;   Surgeon: Burnell Blanks, MD;  Location: [place] CV LAB;  Service: Cardiovascular;  Laterality: N/A;  . CARDIAC SURGERY    . CARDIOVERSION N/A 03/02/2016   Procedure: CARDIOVERSION;  Surgeon: Thayer Headings, MD;  Location: [place];  Service: Cardiovascular;  Laterality: N/A;  . EP IMPLANTABLE DEVICE N/A 10/26/2015   Procedure: Pacemaker Implant;  Surgeon: Will Meredith Leeds, MD;  Location: [place] CV LAB;  Service: Cardiovascular;  Laterality: N/A;  . ESOPHAGOGASTRODUODENOSCOPY (EGD) WITH PROPOFOL N/A 04/07/2018   Procedure: ESOPHAGOGASTRODUODENOSCOPY (EGD) WITH PROPOFOL;  Surgeon: Jerene Bears, MD;  Location: [facility] ENDOSCOPY;  Service: Gastroenterology;  Laterality: N/A;  . EYE SURGERY Bilateral    cataract surgery  . HOT HEMOSTASIS N/A 04/07/2018   Procedure: HOT HEMOSTASIS (ARGON PLASMA COAGULATION/BICAP);  Surgeon: Jerene Bears, MD;  Location: [facility] ENDOSCOPY;  Service: Gastroenterology;  Laterality: N/A;  . NASAL SINUS SURGERY    . TEE WITHOUT CARDIOVERSION N/A 10/25/2015   Procedure: TRANSESOPHAGEAL ECHOCARDIOGRAM (TEE);  Surgeon: Burnell Blanks, MD;  Location: [place];  Service: Open Heart Surgery;  Laterality: N/A;  . TRANSCATHETER AORTIC VALVE REPLACEMENT, TRANSFEMORAL Right 10/25/2015   Procedure: TRANSCATHETER AORTIC VALVE REPLACEMENT, TRANSFEMORAL;  Surgeon: Burnell Blanks, MD;  Location: [place];  Service: Open Heart Surgery;  Laterality: Right;    Social History   Socioeconomic History  . Marital status: Widowed    Spouse name: Not on file  .  Number of children: 3  . Years of education: Not on file  . Highest education level: 12th grade  Occupational History  . Occupation: Retired  Tobacco Use  . Smoking status: Never Smoker  . Smokeless tobacco: Never Used  Substance and Sexual Activity  . Alcohol use: No    Alcohol/week: 0.0 standard drinks  . Drug use: No  . Sexual activity: Never  Other Topics Concern  . Not on file  Social History  Narrative  . Not on file   Social Determinants of Health   Financial Resource Strain:   . Difficulty of Paying Living Expenses: Not on file  Food Insecurity: No Food Insecurity  . Worried About Charity fundraiser in the Last Year: Never true  . Ran Out of Food in the Last Year: Never true  Transportation Needs: No Transportation Needs  . Lack of Transportation (Medical): No  . Lack of Transportation (Non-Medical): No  Physical Activity: Inactive  . Days of Exercise per Week: 0 days  . Minutes of Exercise per Session: 0 min  Stress: No Stress Concern Present  . Feeling of Stress : Not at all  Social Connections: Slightly Isolated  . Frequency of Communication with Friends and Family: More than three times a week  . Frequency of Social Gatherings with Friends and Family: More than three times a week  . Attends Religious Services: More than 4 times per year  . Active Member of Clubs or Organizations: Yes  . Attends Archivist Meetings: More than 4 times per year  . Marital Status: Widowed  Intimate Partner Violence: Not At Risk  . Fear of Current or Ex-Partner: No  . Emotionally Abused: No  . Physically Abused: No  . Sexually Abused: No    Outpatient Encounter Medications as of 06/24/2019  Medication Sig  . acetaminophen (TYLENOL) 500 MG tablet Take 500 mg by mouth every 6 (six) hours as needed for mild pain.  [name] albuterol (VENTOLIN HFA) 108 (90 Base) MCG/ACT inhaler Inhale 2 puffs into the lungs every 4 (four) hours as needed for wheezing or shortness of breath.  [name] amLODipine (NORVASC) 2.5 MG tablet Take 1 tablet (2.5 mg total) by mouth daily.  . Biotin 10 MG CAPS Take 1 capsule by mouth daily.  . calcium-vitamin D (OSCAL WITH D) 500-200 MG-UNIT tablet Take 1 tablet by mouth daily.  [name] docusate sodium (COLACE) 100 MG capsule Take 100 mg by mouth daily as needed for mild constipation.  . enalapril (VASOTEC) 2.5 MG tablet Take 1 tablet (2.5 mg total) by mouth daily.  .  ferrous sulfate 325 (65 FE) MG tablet Take 1 tablet (325 mg total) by mouth 2 (two) times daily with a meal.  . furosemide (LASIX) 40 MG tablet Take 1.5 tablets (60 mg total) by mouth daily.  . metoprolol tartrate (LOPRESSOR) 25 MG tablet Take 1 tablet (25 mg total) by mouth 2 (two) times daily.  . multivitamin-iron-minerals-folic acid (CENTRUM) chewable tablet Chew 1 tablet by mouth daily.  . pantoprazole (PROTONIX) 40 MG tablet Take 1 tablet (40 mg total) by mouth daily at 6 (six) AM.  . [DISCONTINUED] enalapril (VASOTEC) 2.5 MG tablet Take 1 tablet (2.5 mg total) by mouth daily.   No facility-administered encounter medications on file as of 06/24/2019.    Allergies  Allergen Reactions  . Hctz [Hydrochlorothiazide] Other (See Comments)    Pt was ill and affected kidneys   . Codeine Other (See Comments)    Unknown  Made  pt feel ill, has not had any problems since 1977    Review of Systems      Objective:  BP (!) 145/69 (BP Location: Right Arm, Cuff Size: Normal)   Pulse 60   Temp (!) 97.1 F (36.2 C)   Resp 20   Ht 5' 2"  (1.575 m)   Wt 112 lb (50.8 kg)   SpO2 98%   BMI 20.49 kg/m    Wt Readings from Last 3 Encounters:  06/24/19 112 lb (50.8 kg)  06/15/19 109 lb (49.4 kg)  04/21/19 113 lb (51.3 kg)    Physical Exam  Results for orders placed or performed in visit on 05/13/19  CUP PACEART REMOTE DEVICE CHECK  Result Value Ref Range   Date Time Interrogation Session [id number]    Pulse Generator Manufacturer MERM    Pulse Gen Model [id number] Advisa DR MRI    Pulse Gen Serial Number [id number]    Clinic Name [facility] Pulse Generator Type Implantable Pulse Generator    Implantable Pulse Generator Implant Date [id number]    Implantable Lead Manufacturer MERM    Implantable Lead Model 5076 CapSureFix Novus MRI SureScan    Implantable Lead Serial Number [id number]    Implantable Lead Implant Date [id number]    Implantable Lead Location Detail 1  UNKNOWN    Implantable Lead Location [id number]    Implantable Lead Manufacturer MERM    Implantable Lead Model 5076 CapSureFix Novus MRI SureScan    Implantable Lead Serial Number [id number]    Implantable Lead Implant Date [id number]    Implantable Lead Location Detail 1 UNKNOWN    Implantable Lead Location [id number]    Lead Channel Setting Sensing Sensitivity 2.8 mV   Lead Channel Setting Pacing Amplitude 2 V   Lead Channel Setting Pacing Pulse Width 0.4 ms   Lead Channel Setting Pacing Amplitude 2.5 V   Lead Channel Impedance Value 418 ohm   Lead Channel Impedance Value 304 ohm   Lead Channel Sensing Intrinsic Amplitude 3.5 mV   Lead Channel Sensing Intrinsic Amplitude 3.5 mV   Lead Channel Pacing Threshold Amplitude 0.875 V   Lead Channel Pacing Threshold Pulse Width 0.4 ms   Lead Channel Impedance Value 418 ohm   Lead Channel Impedance Value 361 ohm   Lead Channel Sensing Intrinsic Amplitude 9.875 mV   Lead Channel Sensing Intrinsic Amplitude 9.875 mV   Lead Channel Pacing Threshold Amplitude 1.125 V   Lead Channel Pacing Threshold Pulse Width 0.4 ms   Battery Status OK    Battery Remaining Longevity 84 mo   Battery Voltage 3.01 V   Brady Statistic RA Percent Paced 17.89 %   Brady Statistic RV Percent Paced 21.19 %   Brady Statistic AP VP Percent 6.04 %   Brady Statistic AS VP Percent 15.18 %   Brady Statistic AP VS Percent 12.75 %   Brady Statistic AS VS Percent 66.02 %     Ear Cerumen Removal  Date/Time: 06/24/2019 12:29 PM Performed by: Baruch Gouty, FNP Authorized by: Baruch Gouty, FNP   Anesthesia: Local Anesthetic: none Location details: right ear and left ear Patient tolerance: patient tolerated the procedure well with no immediate complications Comments: TM pearly gray without erythema, bulging, or perforation post irrigation.  Procedure type: curette  Sedation: Patient sedated: no       Pertinent labs & imaging results that were available during my care  of the patient were reviewed by me and considered in my medical  decision making.  Assessment & Plan:  Mayar was seen today for medical management of chronic issues, hypertension and atrial fibrillation.  Diagnoses and all orders for this visit:  PAF (paroxysmal atrial fibrillation) (HCC)  Essential hypertension -     CBC with Differential/Platelet -     CMP14+EGFR -     enalapril (VASOTEC) 2.5 MG tablet; Take 1 tablet (2.5 mg total) by mouth daily.  Stage 3b chronic kidney disease -     CMP14+EGFR  GERD without esophagitis -     CBC with Differential/Platelet  Right ear impacted cerumen  CKD (chronic kidney disease), stage III -     enalapril (VASOTEC) 2.5 MG tablet; Take 1 tablet (2.5 mg total) by mouth daily.     Continue all other maintenance medications.  Follow up plan: Return in about 3 months (around 09/22/2019), or if symptoms worsen or fail to improve.  Continue healthy lifestyle choices, including diet (rich in fruits, vegetables, and lean proteins, and low in salt and simple carbohydrates) and exercise (at least 30 minutes of moderate physical activity daily).  Educational handout given for ***  The above assessment and management plan was discussed with the patient. The patient verbalized understanding of and has agreed to the management plan. Patient is aware to call the clinic if they develop any new symptoms or if symptoms persist or worsen. Patient is aware when to return to the clinic for a follow-up visit. Patient educated on when it is appropriate to go to the emergency department.   Monia Pouch, FNP-C [facility] [phone]

## 2019-06-25 LAB — CBC WITH DIFFERENTIAL/PLATELET
Basophils Absolute: 0.1 10*3/uL (ref 0.0–0.2)
Basos: 1 %
EOS (ABSOLUTE): 0.2 10*3/uL (ref 0.0–0.4)
Eos: 3 %
Hematocrit: 37.9 % (ref 34.0–46.6)
Hemoglobin: 12.4 g/dL (ref 11.1–15.9)
Immature Grans (Abs): 0 10*3/uL (ref 0.0–0.1)
Immature Granulocytes: 0 %
Lymphocytes Absolute: 1 10*3/uL (ref 0.7–3.1)
Lymphs: 20 %
MCH: 30.3 pg (ref 26.6–33.0)
MCHC: 32.7 g/dL (ref 31.5–35.7)
MCV: 93 fL (ref 79–97)
Monocytes Absolute: 0.6 10*3/uL (ref 0.1–0.9)
Monocytes: 12 %
Neutrophils Absolute: 3.1 10*3/uL (ref 1.4–7.0)
Neutrophils: 64 %
Platelets: 165 10*3/uL (ref 150–450)
RBC: 4.09 x10E6/uL (ref 3.77–5.28)
RDW: 11.8 % (ref 11.7–15.4)
WBC: 4.8 10*3/uL (ref 3.4–10.8)

## 2019-06-25 LAB — CMP14+EGFR
ALT: 20 IU/L (ref 0–32)
AST: 34 IU/L (ref 0–40)
Albumin/Globulin Ratio: 1.5 (ref 1.2–2.2)
Albumin: 4.1 g/dL (ref 3.5–4.6)
Alkaline Phosphatase: 60 IU/L (ref 39–117)
BUN/Creatinine Ratio: 17 (ref 12–28)
BUN: 20 mg/dL (ref 10–36)
Bilirubin Total: 0.6 mg/dL (ref 0.0–1.2)
CO2: 30 mmol/L — ABNORMAL HIGH (ref 20–29)
Calcium: 9.5 mg/dL (ref 8.7–10.3)
Chloride: 93 mmol/L — ABNORMAL LOW (ref 96–106)
Creatinine, Ser: 1.16 mg/dL — ABNORMAL HIGH (ref 0.57–1.00)
GFR calc Af Amer: 48 mL/min/{1.73_m2} — ABNORMAL LOW (ref >59)
GFR calc non Af Amer: 42 mL/min/{1.73_m2} — ABNORMAL LOW (ref >59)
Globulin, Total: 2.7 g/dL (ref 1.5–4.5)
Glucose: 88 mg/dL (ref 65–99)
Potassium: 3.8 mmol/L (ref 3.5–5.2)
Sodium: 136 mmol/L (ref 134–144)
Total Protein: 6.8 g/dL (ref 6.0–8.5)

## 2019-06-29 ENCOUNTER — Telehealth: Payer: Self-pay | Admitting: Cardiology

## 2019-06-29 NOTE — Telephone Encounter (Signed)
Informed ok to accompany pt to appt tomorrow. Dtr appreciates our call back and consideration of this matter.

## 2019-06-29 NOTE — Telephone Encounter (Signed)
New Message  Patient's daughter is calling in to get approval for Crystal Cooper to accompany patient to her appointment on 06/30/19 at 2:45 pm with Dr. Curt Bears. States that patient does not hear well and has trouble walking that requires someone to be there with her. Please give patient's daughter a call back to confirm.

## 2019-06-30 ENCOUNTER — Encounter: Payer: Self-pay | Admitting: Cardiology

## 2019-06-30 ENCOUNTER — Ambulatory Visit (INDEPENDENT_AMBULATORY_CARE_PROVIDER_SITE_OTHER): Payer: Medicare Other | Admitting: Cardiology

## 2019-06-30 ENCOUNTER — Other Ambulatory Visit: Payer: Self-pay

## 2019-06-30 VITALS — BP 152/82 | HR 70 | Ht 62.0 in | Wt 113.6 lb

## 2019-06-30 DIAGNOSIS — I480 Paroxysmal atrial fibrillation: Secondary | ICD-10-CM

## 2019-06-30 NOTE — Progress Notes (Signed)
Electrophysiology Office Note   Date:  06/30/2019   ID:  Crystal Cooper, DOB Feb 23, 1927, MRN [id number]  PCP:  Baruch Gouty, FNP  Cardiologist:  Jerolyn Center Primary Electrophysiologist:  Constance Haw, MD    No chief complaint on file.    History of Present Illness: Crystal Cooper is a 86 y.o. female who presents today for electrophysiology evaluation.   Hx severe aortic valve stenosis, HTN, persistent atrial fibrillation on Eliquis, asthma, SVT, chronic diastolic CHF.  Had TAVR  12/29/78 complicated by junctional bradycardia.  Dual chamber pacemaker implanted complicated by pocket hematoma.  Today, denies symptoms of palpitations, chest pain, shortness of breath, orthopnea, PND, lower extremity edema, claudication, dizziness, presyncope, syncope, bleeding, or neurologic sequela. The patient is tolerating medications without difficulties.     Past Medical History:  Diagnosis Date  . Anemia    years ago  . Aortic stenosis   . Arthritis   . Asthma   . Atrial fibrillation ([place])   . CHF (congestive heart failure) ([place]) 11/2014  . Family history of adverse reaction to anesthesia    2 daughters would have N/V  . Glaucoma   . Hearing loss   . Heart murmur   . HTN (hypertension)   . Pneumonia   . Prolapsing mitral leaflet syndrome   . Shortness of breath dyspnea    with exertion  . SVT (supraventricular tachycardia) (HCC)    S/P ablation of AVNRT in 2003   Past Surgical History:  Procedure Laterality Date  . APPENDECTOMY    . BIOPSY  04/07/2018   Procedure: BIOPSY;  Surgeon: Jerene Bears, MD;  Location: [facility] ENDOSCOPY;  Service: Gastroenterology;;  . BLADDER SURGERY    . CARDIAC CATHETERIZATION N/A 09/26/2015   Procedure: Right/Left Heart Cath and Coronary Angiography;  Surgeon: Burnell Blanks, MD;  Location: [place] CV LAB;  Service: Cardiovascular;  Laterality: N/A;  . CARDIAC SURGERY    . CARDIOVERSION N/A 03/02/2016   Procedure: CARDIOVERSION;   Surgeon: Thayer Headings, MD;  Location: [place];  Service: Cardiovascular;  Laterality: N/A;  . EP IMPLANTABLE DEVICE N/A 10/26/2015   Procedure: Pacemaker Implant;  Surgeon: Cortnie Ringel Meredith Leeds, MD;  Location: [place] CV LAB;  Service: Cardiovascular;  Laterality: N/A;  . ESOPHAGOGASTRODUODENOSCOPY (EGD) WITH PROPOFOL N/A 04/07/2018   Procedure: ESOPHAGOGASTRODUODENOSCOPY (EGD) WITH PROPOFOL;  Surgeon: Jerene Bears, MD;  Location: [facility] ENDOSCOPY;  Service: Gastroenterology;  Laterality: N/A;  . EYE SURGERY Bilateral    cataract surgery  . HOT HEMOSTASIS N/A 04/07/2018   Procedure: HOT HEMOSTASIS (ARGON PLASMA COAGULATION/BICAP);  Surgeon: Jerene Bears, MD;  Location: [facility] ENDOSCOPY;  Service: Gastroenterology;  Laterality: N/A;  . NASAL SINUS SURGERY    . TEE WITHOUT CARDIOVERSION N/A 10/25/2015   Procedure: TRANSESOPHAGEAL ECHOCARDIOGRAM (TEE);  Surgeon: Burnell Blanks, MD;  Location: [place];  Service: Open Heart Surgery;  Laterality: N/A;  . TRANSCATHETER AORTIC VALVE REPLACEMENT, TRANSFEMORAL Right 10/25/2015   Procedure: TRANSCATHETER AORTIC VALVE REPLACEMENT, TRANSFEMORAL;  Surgeon: Burnell Blanks, MD;  Location: [place];  Service: Open Heart Surgery;  Laterality: Right;     Current Outpatient Medications  Medication Sig Dispense Refill  . acetaminophen (TYLENOL) 500 MG tablet Take 500 mg by mouth every 6 (six) hours as needed for mild pain.    [name] albuterol (VENTOLIN HFA) 108 (90 Base) MCG/ACT inhaler Inhale 2 puffs into the lungs every 4 (four) hours as needed for wheezing or shortness of breath. 18 g 3  .  amLODipine (NORVASC) 2.5 MG tablet Take 1 tablet (2.5 mg total) by mouth daily. 90 tablet 1  . Biotin 10 MG CAPS Take 1 capsule by mouth daily.    . calcium-vitamin D (OSCAL WITH D) 500-200 MG-UNIT tablet Take 1 tablet by mouth daily.    [name] docusate sodium (COLACE) 100 MG capsule Take 100 mg by mouth daily as needed for mild constipation.    . enalapril (VASOTEC) 2.5 MG  tablet Take 1 tablet (2.5 mg total) by mouth daily. 90 tablet 2  . ferrous sulfate 325 (65 FE) MG tablet Take 1 tablet (325 mg total) by mouth 2 (two) times daily with a meal.  3  . furosemide (LASIX) 40 MG tablet Take 1.5 tablets (60 mg total) by mouth daily. 135 tablet 3  . metoprolol tartrate (LOPRESSOR) 25 MG tablet Take 1 tablet (25 mg total) by mouth 2 (two) times daily. 180 tablet 3  . multivitamin-iron-minerals-folic acid (CENTRUM) chewable tablet Chew 1 tablet by mouth daily.    . pantoprazole (PROTONIX) 40 MG tablet Take 1 tablet (40 mg total) by mouth daily at 6 (six) AM. 90 tablet 1   No current facility-administered medications for this visit.    Allergies:   Hctz [hydrochlorothiazide] and Codeine   Social History:  The patient  reports that she has never smoked. She has never used smokeless tobacco. She reports that she does not drink alcohol or use drugs.   Family History:  The patient's family history includes Hypertension in her mother; Pneumonia in her father.    ROS:  Please see the history of present illness.   Otherwise, review of systems is positive for none.   All other systems are reviewed and negative.   PHYSICAL EXAM: VS:  BP (!) 152/82   Pulse 70   Ht 5\' 2"  (1.575 m)   Wt 113 lb 9.6 oz (51.5 kg)   SpO2 97%   BMI 20.78 kg/m  , BMI Body mass index is 20.78 kg/m. GEN: Well nourished, well developed, in no acute distress  HEENT: normal  Neck: no JVD, carotid bruits, or masses Cardiac: RRR; no murmurs, rubs, or gallops,no edema  Respiratory:  clear to auscultation bilaterally, normal work of breathing GI: soft, nontender, nondistended, + BS MS: no deformity or atrophy  Skin: warm and dry, device site well healed Neuro:  Strength and sensation are intact Psych: euthymic mood, full affect  EKG:  EKG is not ordered today. Personal review of the ekg ordered 02/24/19 shows sinus rhythm  Personal review of the device interrogation today. Results in [place]: 11/11/2018: TSH 3.340 06/24/2019: ALT 20; BUN 20; Creatinine, Ser 1.16; Hemoglobin 12.4; Platelets 165; Potassium 3.8; Sodium 136    Lipid Panel     Component Value Date/Time   CHOL 146 11/11/2018 1557   TRIG 128 11/11/2018 1557   HDL 70 11/11/2018 1557   CHOLHDL 2.1 11/11/2018 1557   LDLCALC 50 11/11/2018 1557     Wt Readings from Last 3 Encounters:  06/30/19 113 lb 9.6 oz (51.5 kg)  06/24/19 112 lb (50.8 kg)  06/15/19 109 lb (49.4 kg)      Other studies Reviewed: Additional studies/ records that were reviewed today include: TTE 11/28/15  Review of the above records today demonstrates:  - Left ventricle: The cavity size was normal. Wall thickness was   normal. Systolic function was normal. The estimated ejection   fraction was in the range of 55% to 60%. Indeterminant diastolic  function, atrial fibrillation. Septal bounce consistent with   pacing. - Aortic valve: Bioprosthetic aortic valve s/p TAVR. Mild   perivalvular aortic insufficiency. There was no stenosis. Mean   gradient (S): 5 mm Hg. - Mitral valve: There was moderate regurgitation. - Left atrium: The atrium was moderately dilated. - Right ventricle: The cavity size was normal. Pacer wire or   catheter noted in right ventricle. Systolic function was normal. - Right atrium: The atrium was mildly dilated. - Tricuspid valve: There was mild-moderate regurgitation. Peak   RV-RA gradient (S): 36 mm Hg. - Pulmonary arteries: PA peak pressure: 39 mm Hg (S). - Inferior vena cava: The vessel was normal in size. The   respirophasic diameter changes were in the normal range (>= 50%),   consistent with normal central venous pressure.   ASSESSMENT AND PLAN:  1.  Atrial fibrillation: Currently on Eliquis.  CHA2DS2-VASc of 5.  Normal on device interrogation.  No changes.  2. 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  Device functioning without issue.  No  changes.      Current medicines are reviewed at length with the patient today.   The patient does not have concerns regarding her medicines.  The following changes were made today: None  Labs/ tests ordered today include:  No orders of the defined types were placed in this encounter.    Disposition:   FU with Abbie Berling 12 months  Signed, Nicholas Ossa Meredith Leeds, MD  06/30/2019 3:17 PM     [address] [place] [address] [phone] (office) [phone] (fax)

## 2019-07-06 ENCOUNTER — Other Ambulatory Visit: Payer: Self-pay

## 2019-07-06 ENCOUNTER — Ambulatory Visit (HOSPITAL_COMMUNITY): Payer: Medicare Other | Attending: Cardiology

## 2019-07-06 DIAGNOSIS — I359 Nonrheumatic aortic valve disorder, unspecified: Secondary | ICD-10-CM

## 2019-07-07 DIAGNOSIS — Z23 Encounter for immunization: Secondary | ICD-10-CM | POA: Diagnosis not present

## 2019-08-03 ENCOUNTER — Other Ambulatory Visit: Payer: Self-pay

## 2019-08-03 ENCOUNTER — Ambulatory Visit (INDEPENDENT_AMBULATORY_CARE_PROVIDER_SITE_OTHER): Payer: Medicare Other | Admitting: Family Medicine

## 2019-08-03 ENCOUNTER — Encounter: Payer: Self-pay | Admitting: Family Medicine

## 2019-08-03 DIAGNOSIS — R410 Disorientation, unspecified: Secondary | ICD-10-CM | POA: Diagnosis not present

## 2019-08-03 LAB — URINALYSIS, COMPLETE
Bilirubin, UA: NEGATIVE
Glucose, UA: NEGATIVE
Ketones, UA: NEGATIVE
Nitrite, UA: NEGATIVE
Protein,UA: NEGATIVE
Specific Gravity, UA: 1.02 (ref 1.005–1.030)
Urobilinogen, Ur: 0.2 mg/dL (ref 0.2–1.0)
pH, UA: 7 (ref 5.0–7.5)

## 2019-08-03 LAB — MICROSCOPIC EXAMINATION: Renal Epithel, UA: NONE SEEN /hpf
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
## 2019-08-04 ENCOUNTER — Other Ambulatory Visit: Payer: Self-pay | Admitting: Family Medicine

## 2019-08-04 ENCOUNTER — Telehealth: Payer: Self-pay | Admitting: *Deleted

## 2019-08-04 DIAGNOSIS — N3001 Acute cystitis with hematuria: Secondary | ICD-10-CM

## 2019-08-04 MED ORDER — CEFDINIR 300 MG PO CAPS: 300.0000 mg | ORAL_CAPSULE | Freq: Two times a day (BID) | ORAL | 0 refills | Status: AC

## 2019-08-04 NOTE — Telephone Encounter (Signed)
Albuterol HFA 20mcg INHALER is not covered on her insurance plan  No alternatives were given

## 2019-08-05 ENCOUNTER — Other Ambulatory Visit: Payer: Self-pay | Admitting: Family Medicine

## 2019-08-05 DIAGNOSIS — J4520 Mild intermittent asthma, uncomplicated: Secondary | ICD-10-CM

## 2019-08-05 LAB — URINE CULTURE

## 2019-08-05 MED ORDER — ALBUTEROL SULFATE HFA 108 (90 BASE) MCG/ACT IN AERS: 2.0000 | INHALATION_SPRAY | RESPIRATORY_TRACT | 11 refills | Status: AC | PRN

## 2019-08-05 NOTE — Telephone Encounter (Signed)
New RX sent

## 2019-08-05 NOTE — Telephone Encounter (Signed)
Pt aware.

## 2019-08-07 DIAGNOSIS — Z23 Encounter for immunization: Secondary | ICD-10-CM | POA: Diagnosis not present

## 2019-08-12 ENCOUNTER — Ambulatory Visit (INDEPENDENT_AMBULATORY_CARE_PROVIDER_SITE_OTHER): Payer: Medicare Other | Admitting: *Deleted

## 2019-08-12 DIAGNOSIS — I480 Paroxysmal atrial fibrillation: Secondary | ICD-10-CM | POA: Diagnosis not present

## 2019-08-12 LAB — CUP PACEART REMOTE DEVICE CHECK
Battery Remaining Longevity: 76 mo
Battery Voltage: 3 V
Brady Statistic AP VP Percent: 14.97 %
Brady Statistic AP VS Percent: 13.77 %
Brady Statistic AS VP Percent: 22.49 %
Brady Statistic AS VS Percent: 48.78 %
Brady Statistic RA Percent Paced: 26.33 %
Brady Statistic RV Percent Paced: 37.07 %
Date Time Interrogation Session: 20210217190117
Implantable Lead Implant Date: 20170503
Implantable Lead Implant Date: 20170503
Implantable Lead Location: 753859
Implantable Lead Location: 753860
Implantable Lead Model: 5076
Implantable Lead Model: 5076
Implantable Pulse Generator Implant Date: 20170503
Lead Channel Impedance Value: 304 Ohm
Lead Channel Impedance Value: 342 Ohm
Lead Channel Impedance Value: 418 Ohm
Lead Channel Impedance Value: 418 Ohm
Lead Channel Pacing Threshold Amplitude: 0.875 V
Lead Channel Pacing Threshold Amplitude: 1 V
Lead Channel Pacing Threshold Pulse Width: 0.4 ms
Lead Channel Pacing Threshold Pulse Width: 0.4 ms
Lead Channel Sensing Intrinsic Amplitude: 4.125 mV
Lead Channel Sensing Intrinsic Amplitude: 4.125 mV
Lead Channel Sensing Intrinsic Amplitude: 9.875 mV
Lead Channel Sensing Intrinsic Amplitude: 9.875 mV
Lead Channel Setting Pacing Amplitude: 2 V
Lead Channel Setting Pacing Amplitude: 2.5 V
Lead Channel Setting Pacing Pulse Width: 0.4 ms
Lead Channel Setting Sensing Sensitivity: 2.8 mV

## 2019-08-13 NOTE — Progress Notes (Signed)
PPM Remote  

## 2019-08-19 ENCOUNTER — Telehealth: Payer: Self-pay | Admitting: Family Medicine

## 2019-08-19 NOTE — Chronic Care Management (AMB) (Signed)
  Chronic Care Management   Note  08/19/2019 Name: DELYNN OLVERA MRN: [id number] DOB: [birth year]  SUVI ARCHULETTA is a 86 y.o. year old female who is a primary care patient of Rakes, Connye Burkitt, FNP. I reached out to [name] by phone today in response to a referral sent by Ms. Iviona Hole Polus's health plan.     Ms. [place] daughter [name] was given information about Chronic Care Management services today including:  1. CCM service includes personalized support from designated clinical staff supervised by her physician, including individualized plan of care and coordination with other care providers 2. 24/7 contact phone numbers for assistance for urgent and routine care needs. 3. Service will only be billed when office clinical staff spend 20 minutes or more in a month to coordinate care. 4. Only one practitioner may furnish and bill the service in a calendar month. 5. The patient may stop CCM services at any time (effective at the end of the month) by phone call to the office staff. 6. The patient will be responsible for cost sharing (co-pay) of up to 20% of the service fee (after annual deductible is met).  Patient's daughter [name] agreed to services and verbal consent obtained.   Follow up plan: Telephone appointment with care management team member scheduled for:09/30/2019  Noreene Larsson, [place], [place],  [address] Direct Dial: [phone] [email or web address] Website: [email or web address]

## 2019-08-23 ENCOUNTER — Encounter (HOSPITAL_COMMUNITY): Payer: Self-pay | Admitting: Emergency Medicine

## 2019-08-23 ENCOUNTER — Emergency Department (HOSPITAL_COMMUNITY): Payer: Medicare Other

## 2019-08-23 ENCOUNTER — Other Ambulatory Visit: Payer: Self-pay

## 2019-08-23 ENCOUNTER — Observation Stay (HOSPITAL_COMMUNITY)
Admission: EM | Admit: 2019-08-23 | Discharge: 2019-08-24 | Disposition: A | Discharge status: Home / Self Care | Payer: Medicare Other | Source: Home / Self Care | Attending: Emergency Medicine | Admitting: Emergency Medicine

## 2019-08-23 DIAGNOSIS — J449 Chronic obstructive pulmonary disease, unspecified: Secondary | ICD-10-CM | POA: Insufficient documentation

## 2019-08-23 DIAGNOSIS — T783XXA Angioneurotic edema, initial encounter: Secondary | ICD-10-CM | POA: Diagnosis not present

## 2019-08-23 DIAGNOSIS — N183 Chronic kidney disease, stage 3 unspecified: Secondary | ICD-10-CM | POA: Insufficient documentation

## 2019-08-23 DIAGNOSIS — E876 Hypokalemia: Secondary | ICD-10-CM | POA: Insufficient documentation

## 2019-08-23 DIAGNOSIS — I471 Supraventricular tachycardia: Secondary | ICD-10-CM | POA: Insufficient documentation

## 2019-08-23 DIAGNOSIS — I5032 Chronic diastolic (congestive) heart failure: Secondary | ICD-10-CM | POA: Insufficient documentation

## 2019-08-23 DIAGNOSIS — I130 Hypertensive heart and chronic kidney disease with heart failure and stage 1 through stage 4 chronic kidney disease, or unspecified chronic kidney disease: Secondary | ICD-10-CM | POA: Insufficient documentation

## 2019-08-23 DIAGNOSIS — T464X5A Adverse effect of angiotensin-converting-enzyme inhibitors, initial encounter: Secondary | ICD-10-CM | POA: Insufficient documentation

## 2019-08-23 DIAGNOSIS — Z952 Presence of prosthetic heart valve: Secondary | ICD-10-CM | POA: Insufficient documentation

## 2019-08-23 DIAGNOSIS — R531 Weakness: Secondary | ICD-10-CM | POA: Diagnosis not present

## 2019-08-23 DIAGNOSIS — Z79899 Other long term (current) drug therapy: Secondary | ICD-10-CM | POA: Insufficient documentation

## 2019-08-23 DIAGNOSIS — I480 Paroxysmal atrial fibrillation: Secondary | ICD-10-CM | POA: Diagnosis present

## 2019-08-23 DIAGNOSIS — I2510 Atherosclerotic heart disease of native coronary artery without angina pectoris: Secondary | ICD-10-CM | POA: Insufficient documentation

## 2019-08-23 DIAGNOSIS — H9190 Unspecified hearing loss, unspecified ear: Secondary | ICD-10-CM | POA: Insufficient documentation

## 2019-08-23 DIAGNOSIS — G9341 Metabolic encephalopathy: Secondary | ICD-10-CM | POA: Diagnosis not present

## 2019-08-23 DIAGNOSIS — I6381 Other cerebral infarction due to occlusion or stenosis of small artery: Secondary | ICD-10-CM | POA: Diagnosis not present

## 2019-08-23 DIAGNOSIS — K219 Gastro-esophageal reflux disease without esophagitis: Secondary | ICD-10-CM | POA: Insufficient documentation

## 2019-08-23 DIAGNOSIS — I10 Essential (primary) hypertension: Secondary | ICD-10-CM | POA: Diagnosis present

## 2019-08-23 DIAGNOSIS — J189 Pneumonia, unspecified organism: Secondary | ICD-10-CM | POA: Diagnosis not present

## 2019-08-23 DIAGNOSIS — R221 Localized swelling, mass and lump, neck: Secondary | ICD-10-CM | POA: Diagnosis not present

## 2019-08-23 DIAGNOSIS — Z950 Presence of cardiac pacemaker: Secondary | ICD-10-CM | POA: Insufficient documentation

## 2019-08-23 DIAGNOSIS — R0602 Shortness of breath: Secondary | ICD-10-CM | POA: Diagnosis not present

## 2019-08-23 DIAGNOSIS — Z20822 Contact with and (suspected) exposure to covid-19: Secondary | ICD-10-CM | POA: Insufficient documentation

## 2019-08-23 DIAGNOSIS — R29701 NIHSS score 1: Secondary | ICD-10-CM | POA: Diagnosis not present

## 2019-08-23 DIAGNOSIS — J45909 Unspecified asthma, uncomplicated: Secondary | ICD-10-CM | POA: Diagnosis present

## 2019-08-23 DIAGNOSIS — R4182 Altered mental status, unspecified: Secondary | ICD-10-CM | POA: Diagnosis not present

## 2019-08-23 DIAGNOSIS — D649 Anemia, unspecified: Secondary | ICD-10-CM | POA: Insufficient documentation

## 2019-08-23 HISTORY — DX: Angioneurotic edema, initial encounter: T78.3XXA

## 2019-08-23 LAB — BASIC METABOLIC PANEL
Anion gap: 13 (ref 5–15)
BUN: 14 mg/dL (ref 8–23)
CO2: 28 mmol/L (ref 22–32)
Calcium: 8.9 mg/dL (ref 8.9–10.3)
Chloride: 95 mmol/L — ABNORMAL LOW (ref 98–111)
Creatinine, Ser: 1.17 mg/dL — ABNORMAL HIGH (ref 0.44–1.00)
GFR calc Af Amer: 48 mL/min — ABNORMAL LOW (ref >60)
GFR calc non Af Amer: 41 mL/min — ABNORMAL LOW (ref >60)
Glucose, Bld: 111 mg/dL — ABNORMAL HIGH (ref 70–99)
Potassium: 3.1 mmol/L — ABNORMAL LOW (ref 3.5–5.1)
Sodium: 136 mmol/L (ref 135–145)

## 2019-08-23 LAB — CBC
HCT: 34.3 % — ABNORMAL LOW (ref 36.0–46.0)
Hemoglobin: 11.1 g/dL — ABNORMAL LOW (ref 12.0–15.0)
MCH: 31.5 pg (ref 26.0–34.0)
MCHC: 32.4 g/dL (ref 30.0–36.0)
MCV: 97.4 fL (ref 80.0–100.0)
Platelets: 202 10*3/uL (ref 150–400)
RBC: 3.52 MIL/uL — ABNORMAL LOW (ref 3.87–5.11)
RDW: 13.2 % (ref 11.5–15.5)
WBC: 9.6 10*3/uL (ref 4.0–10.5)
nRBC: 0 % (ref 0.0–0.2)

## 2019-08-23 MED ORDER — ACETAMINOPHEN 325 MG PO TABS: 650.0000 mg | ORAL_TABLET | Freq: Four times a day (QID) | ORAL | Status: DC | PRN

## 2019-08-23 MED ORDER — FAMOTIDINE IN NACL 20-0.9 MG/50ML-% IV SOLN
20.0000 mg | Freq: Two times a day (BID) | INTRAVENOUS | Status: DC
  Administered 2019-08-23 – 2019-08-24 (×2): 20 mg via INTRAVENOUS
  Filled 2019-08-23 (×2): qty 50

## 2019-08-23 MED ORDER — METHYLPREDNISOLONE SODIUM SUCC 125 MG IJ SOLR
125.0000 mg | Freq: Once | INTRAMUSCULAR | Status: AC
  Administered 2019-08-23: 125 mg via INTRAVENOUS
  Filled 2019-08-23: qty 2

## 2019-08-23 MED ORDER — IOHEXOL 300 MG/ML  SOLN
50.0000 mL | Freq: Once | INTRAMUSCULAR | Status: AC | PRN
  Administered 2019-08-23: 50 mL via INTRAVENOUS

## 2019-08-23 MED ORDER — SODIUM CHLORIDE 0.9 % IV SOLN
INTRAVENOUS | Status: DC | PRN
  Administered 2019-08-23: 23:00:00 250 mL via INTRAVENOUS

## 2019-08-23 MED ORDER — ONDANSETRON HCL 4 MG PO TABS: 4.0000 mg | ORAL_TABLET | Freq: Four times a day (QID) | ORAL | Status: DC | PRN

## 2019-08-23 MED ORDER — ALBUTEROL SULFATE HFA 108 (90 BASE) MCG/ACT IN AERS
2.0000 | INHALATION_SPRAY | Freq: Once | RESPIRATORY_TRACT | Status: AC
  Administered 2019-08-23: 2 via RESPIRATORY_TRACT
  Filled 2019-08-23: qty 6.7

## 2019-08-23 MED ORDER — ACETAMINOPHEN 650 MG RE SUPP: 650.0000 mg | Freq: Four times a day (QID) | RECTAL | Status: DC | PRN

## 2019-08-23 MED ORDER — PREDNISONE 20 MG PO TABS
20.0000 mg | ORAL_TABLET | Freq: Two times a day (BID) | ORAL | Status: DC
  Administered 2019-08-24: 20 mg via ORAL
  Filled 2019-08-23: qty 1

## 2019-08-23 MED ORDER — FAMOTIDINE IN NACL 20-0.9 MG/50ML-% IV SOLN: 20.0000 mg | INTRAVENOUS | Status: DC

## 2019-08-23 MED ORDER — DIPHENHYDRAMINE HCL 25 MG PO CAPS: 25.0000 mg | ORAL_CAPSULE | Freq: Four times a day (QID) | ORAL | Status: DC | PRN

## 2019-08-23 MED ORDER — HEPARIN SODIUM (PORCINE) 5000 UNIT/ML IJ SOLN
5000.0000 [IU] | Freq: Three times a day (TID) | INTRAMUSCULAR | Status: DC
  Administered 2019-08-23 – 2019-08-24 (×2): 5000 [IU] via SUBCUTANEOUS
  Filled 2019-08-23 (×2): qty 1

## 2019-08-23 MED ORDER — ONDANSETRON HCL 4 MG/2ML IJ SOLN: 4.0000 mg | Freq: Four times a day (QID) | INTRAMUSCULAR | Status: DC | PRN

## 2019-08-23 NOTE — H&P (Signed)
History and Physical   Crystal Cooper CBS:[id number] DOB: [birth year] DOA: 08/23/2019  Referring MD/NP/PA: Dr. Lenor Derrick  PCP: Baruch Gouty, FNP   Outpatient Specialists: None  Patient coming from: Home  Chief Complaint: Tongue and facial swelling  HPI: Crystal Cooper is a 86 y.o. female with medical history significant of hypertension, reactive airway disease, paroxysmal atrial fibrillation, aortic stenosis status post surgery, hearing loss, diastolic dysfunction CHF who presented with 1 day history of shortness of breath facial and tongue swelling since last night.  Patient is on ACE inhibitor and beta-blockers.  Symptoms started after she took her metoprolol last night.  She was seen in the ER today with mild swelling of her tongue and face.  She has some mild stridor so she is being admitted to the hospital with suspected angioedema.  Palpation the swelling was worse last night but is little better today.  She denied any previous facial swelling.  She has what appears to be atopy with her asthma and reactive airway disease.  She has been on ACE inhibitor for a while.  Patient is now suspected to have ACE inhibitor induced angioedema and will be admitted to the hospital for treatment..  ED Course: Temperature 98.7 blood pressure 158/77 pulse 88 respirate 28 oxygen sat 94% room air.  Potassium 3.1 otherwise most chemistry within normal.  White count 9.6 hemoglobin 7.9 platelets 202.  Glucose 111.  COVID-19 screen pending.  CT neck soft tissue showed no acute abnormalities.  Also small groundglass opacity in the right lung nonspecific.  Checks x-ray showed no acute findings.  Patient will be admitted for observation and be treated for angioedema.  Review of Systems: As per HPI otherwise 10 point review of systems negative.    Past Medical History:  Diagnosis Date  . Anemia    years ago  . Aortic stenosis   . Arthritis   . Asthma   . Atrial fibrillation ([place])   . CHF (congestive heart  failure) ([place]) 11/2014  . Family history of adverse reaction to anesthesia    2 daughters would have N/V  . Glaucoma   . Hearing loss   . Heart murmur   . HTN (hypertension)   . Pneumonia   . Prolapsing mitral leaflet syndrome   . Shortness of breath dyspnea    with exertion  . SVT (supraventricular tachycardia) (HCC)    S/P ablation of AVNRT in 2003    Past Surgical History:  Procedure Laterality Date  . APPENDECTOMY    . BIOPSY  04/07/2018   Procedure: BIOPSY;  Surgeon: Jerene Bears, MD;  Location[facility] ENDOSCOPY;  Service: Gastroenterology;;  . BLADDER SURGERY    . CARDIAC CATHETERIZATION N/A 09/26/2015   Procedure: Right/Left Heart Cath and Coronary Angiography;  Surgeon: Burnell Blanks, MD;  Location: [place] CV LAB;  Service: Cardiovascular;  Laterality: N/A;  . CARDIAC SURGERY    . CARDIOVERSION N/A 03/02/2016   Procedure: CARDIOVERSION;  Surgeon: Thayer Headings, MD;  Location: [place];  Service: Cardiovascular;  Laterality: N/A;  . EP IMPLANTABLE DEVICE N/A 10/26/2015   Procedure: Pacemaker Implant;  Surgeon: Will Meredith Leeds, MD;  Location: [place] CV LAB;  Service: Cardiovascular;  Laterality: N/A;  . ESOPHAGOGASTRODUODENOSCOPY (EGD) WITH PROPOFOL N/A 04/07/2018   Procedure: ESOPHAGOGASTRODUODENOSCOPY (EGD) WITH PROPOFOL;  Surgeon: Jerene Bears, MD;  Location[facility] ENDOSCOPY;  Service: Gastroenterology;  Laterality: N/A;  . EYE SURGERY Bilateral    cataract surgery  . HOT HEMOSTASIS N/A 04/07/2018  Procedure: HOT HEMOSTASIS (ARGON PLASMA COAGULATION/BICAP);  Surgeon: Jerene Bears, MD;  Location: [facility] ENDOSCOPY;  Service: Gastroenterology;  Laterality: N/A;  . NASAL SINUS SURGERY    . TEE WITHOUT CARDIOVERSION N/A 10/25/2015   Procedure: TRANSESOPHAGEAL ECHOCARDIOGRAM (TEE);  Surgeon: Burnell Blanks, MD;  Location: [facility];  Service: Open Heart Surgery;  Laterality: N/A;  . TRANSCATHETER AORTIC VALVE REPLACEMENT, TRANSFEMORAL Right 10/25/2015    Procedure: TRANSCATHETER AORTIC VALVE REPLACEMENT, TRANSFEMORAL;  Surgeon: Burnell Blanks, MD;  Location: [facility];  Service: Open Heart Surgery;  Laterality: Right;     reports that she has never smoked. She has never used smokeless tobacco. She reports that she does not drink alcohol or use drugs.  Allergies  Allergen Reactions  . Hctz [Hydrochlorothiazide] Other (See Comments)    Pt was ill and this affected her kidneys   . Aspirin Other (See Comments)    Cardiologist said the patient is to not take this  . Codeine Other (See Comments)    Made the patient feel ill, has not had any problems since 1977    Family History  Problem Relation Age of Onset  . Hypertension Mother   . Pneumonia Father   . Heart attack Neg Hx   . Colon cancer Neg Hx   . Esophageal cancer Neg Hx      Prior to Admission medications   Medication Sig Start Date End Date Taking? Authorizing Provider  acetaminophen (TYLENOL) 500 MG tablet Take 500 mg by mouth every 6 (six) hours as needed for mild pain.    [provider]  albuterol (PROAIR HFA) 108 (90 Base) MCG/ACT inhaler Inhale 2 puffs into the lungs every 4 (four) hours as needed for wheezing or shortness of breath. 08/05/19   Baruch Gouty, FNP  amLODipine (NORVASC) 2.5 MG tablet Take 1 tablet (2.5 mg total) by mouth daily. 03/24/19   Baruch Gouty, FNP  Biotin 10 MG CAPS Take 1 capsule by mouth daily.    [provider]  calcium-vitamin D (OSCAL WITH D) 500-200 MG-UNIT tablet Take 1 tablet by mouth daily. 08/27/16   Cherre Robins, PharmD  docusate sodium (COLACE) 100 MG capsule Take 100 mg by mouth daily as needed for mild constipation.    [provider]  enalapril (VASOTEC) 2.5 MG tablet Take 1 tablet (2.5 mg total) by mouth daily. 06/24/19 09/22/19  Baruch Gouty, FNP  ferrous sulfate 325 (65 FE) MG tablet Take 1 tablet (325 mg total) by mouth 2 (two) times daily with a meal. 04/08/18   Eulogio Bear U, DO  furosemide  (LASIX) 40 MG tablet Take 1.5 tablets (60 mg total) by mouth daily. 03/24/19   Baruch Gouty, FNP  metoprolol tartrate (LOPRESSOR) 25 MG tablet Take 1 tablet (25 mg total) by mouth 2 (two) times daily. 05/20/19   Camnitz, Ocie Doyne, MD  multivitamin-iron-minerals-folic acid (CENTRUM) chewable tablet Chew 1 tablet by mouth daily.    [provider]  pantoprazole (PROTONIX) 40 MG tablet Take 1 tablet (40 mg total) by mouth daily at 6 (six) AM. 03/24/19 06/30/19  Baruch Gouty, FNP    Physical Exam: Vitals:   08/23/19 1900 08/23/19 2000 08/23/19 2030 08/23/19 2138  BP:    (!) 152/78  Pulse: 80 73 65 88  Resp: (!) 28 19 20 18   Temp:    98.2 F (36.8 C)  TempSrc:    Oral  SpO2: 98% 94% 94% 95%  Weight:    49.9 kg  Height:    5\' 2"  (1.575 m)      Constitutional: Strong, Frail no acute distress Vitals:   08/23/19 1900 08/23/19 2000 08/23/19 2030 08/23/19 2138  BP:    (!) 152/78  Pulse: 80 73 65 88  Resp: (!) 28 19 20 18   Temp:    98.2 F (36.8 C)  TempSrc:    Oral  SpO2: 98% 94% 94% 95%  Weight:    49.9 kg  Height:    5\' 2"  (1.575 m)   Eyes: PERRL, lids and conjunctivae normal ENMT: Mild macroglossia, lips slightly swollen, mild facial swelling Neck: normal, supple, no masses, no thyromegaly Respiratory: clear to auscultation bilaterally, no wheezing, no crackles. Normal respiratory effort. No accessory muscle use.  Cardiovascular: Regular rate and rhythm, systolic murmur/ rubs / gallops. No extremity edema. 2+ pedal pulses. No carotid bruits.  Abdomen: no tenderness, no masses palpated. No hepatosplenomegaly. Bowel sounds positive.  Musculoskeletal: no clubbing / cyanosis. No joint deformity upper and lower extremities. Good ROM, no contractures. Normal muscle tone.  Skin: no rashes, lesions, ulcers. No induration Neurologic: CN 2-12 grossly intact. Sensation intact, DTR normal. Strength 5/5 in all 4.  Psychiatric: Normal judgment and insight. Alert and oriented x 3.  Normal mood.     Labs on Admission: I have personally reviewed following labs and imaging studies  CBC: Recent Labs  Lab 08/23/19 1623  WBC 9.6  HGB 11.1*  HCT 34.3*  MCV 97.4  PLT 063   Basic Metabolic Panel: Recent Labs  Lab 08/23/19 1623  NA 136  K 3.1*  CL 95*  CO2 28  GLUCOSE 111*  BUN 14  CREATININE 1.17*  CALCIUM 8.9   GFR: Estimated Creatinine Clearance: 25.2 mL/min (A) (by C-G formula based on SCr of 1.17 mg/dL (H)). Liver Function Tests: No results for input(s): AST, ALT, ALKPHOS, BILITOT, PROT, ALBUMIN in the last 168 hours. No results for input(s): LIPASE, AMYLASE in the last 168 hours. No results for input(s): AMMONIA in the last 168 hours. Coagulation Profile: No results for input(s): INR, PROTIME in the last 168 hours. Cardiac Enzymes: No results for input(s): CKTOTAL, CKMB, CKMBINDEX, TROPONINI in the last 168 hours. BNP (last 3 results) No results for input(s): PROBNP in the last 8760 hours. HbA1C: No results for input(s): HGBA1C in the last 72 hours. CBG: No results for input(s): GLUCAP in the last 168 hours. Lipid Profile: No results for input(s): CHOL, HDL, LDLCALC, TRIG, CHOLHDL, LDLDIRECT in the last 72 hours. Thyroid Function Tests: No results for input(s): TSH, T4TOTAL, FREET4, T3FREE, THYROIDAB in the last 72 hours. Anemia Panel: No results for input(s): VITAMINB12, FOLATE, FERRITIN, TIBC, IRON, RETICCTPCT in the last 72 hours. Urine analysis:    Component Value Date/Time   COLORURINE YELLOW 04/06/2018 1255   APPEARANCEUR Clear 08/03/2019 1620   LABSPEC 1.008 04/06/2018 1255   PHURINE 8.0 04/06/2018 1255   GLUCOSEU Negative 08/03/2019 1620   HGBUR SMALL (A) 04/06/2018 1255   BILIRUBINUR Negative 08/03/2019 1620   KETONESUR NEGATIVE 04/06/2018 1255   PROTEINUR Negative 08/03/2019 1620   PROTEINUR NEGATIVE 04/06/2018 1255   NITRITE Negative 08/03/2019 1620   NITRITE NEGATIVE 04/06/2018 1255   LEUKOCYTESUR 1+ (A) 08/03/2019 1620     Sepsis Labs: @LABRCNTIP (procalcitonin:4,lacticidven:4) )No results found for this or any previous visit (from the past 240 hour(s)).   Radiological Exams on Admission: CT Soft Tissue Neck W Contrast  Result Date: 08/23/2019 CLINICAL DATA:  Epiglottitis or tonsillitis suspected. Shortness of breath with sensation of  throat swelling. EXAM: CT NECK WITH CONTRAST TECHNIQUE: Multidetector CT imaging of the neck was performed using the standard protocol following the bolus administration of intravenous contrast. CONTRAST:  82mL OMNIPAQUE IOHEXOL 300 MG/ML  SOLN COMPARISON:  Chest CT 07/06/2016. FINDINGS: Pharynx and larynx: No evidence of mass or swelling. No fluid collection or inflammatory changes in the parapharyngeal or retropharyngeal spaces. Salivary glands: No inflammation, mass, or stone. Thyroid: 8 mm hypoattenuating right thyroid nodule for which no follow-up is recommended. Lymph nodes: No enlarged or suspicious lymph nodes in the neck. Vascular: Major vascular structures of the neck are patent. Moderate calcified atherosclerosis at the carotid bifurcations with likely mild proximal right ICA stenosis. Aortic atherosclerosis. Limited intracranial: Unremarkable. Visualized orbits: Not imaged. Mastoids and visualized paranasal sinuses: Clear. Skeleton: Advanced disc and moderate facet degeneration in the cervical spine with severe asymmetric right neural foraminal stenosis at C3-4, C4-5, and C6-7 and severe neural foraminal stenosis bilaterally at C5-6. Multilevel spinal stenosis, greatest at C5-6 where a partially calcified central disc protrusion results in severe spinal stenosis. Upper chest: Motion artifact in the lung apices with biapical pleuroparenchymal scarring. Small ground-glass opacities medially and laterally in the right upper lobe, new from the 2018 chest CT. Chronic branching density in the left upper lobe suggesting mucoid impaction. Partially visualized ICD. Other: None. IMPRESSION:  1. No acute abnormality identified in the neck. 2. Small ground-glass opacities in the right lung apex with assessment limited by motion artifact, nonspecific though likely infectious or inflammatory. 3. Advanced cervical disc and facet degeneration with severe spinal stenosis at C5-6 and severe multilevel neural foraminal stenosis. 4. Aortic Atherosclerosis (ICD10-I70.0). Electronically Signed   By: Logan Bores M.D.   On: 08/23/2019 18:23   DG Chest Port 1 View  Result Date: 08/23/2019 CLINICAL DATA:  Shortness of breath. Allergic reaction. Aortic stenosis. CHF. EXAM: PORTABLE CHEST 1 VIEW COMPARISON:  04/06/2018 FINDINGS: Pacer. Aortic valve repair. Midline trachea. Mild cardiomegaly. Atherosclerosis in the transverse aorta. No pleural effusion or pneumothorax. Hyperinflation. Moderate interstitial thickening. Left base scarring. IMPRESSION: 1. No acute cardiopulmonary disease. 2. Hyperinflation and interstitial thickening, most consistent with COPD/chronic bronchitis. 3.  Cardiomegaly without congestive failure. 4.  Aortic Atherosclerosis (ICD10-I70.0). Electronically Signed   By: Abigail Miyamoto M.D.   On: 08/23/2019 17:17    EKG: Independently reviewed.  It shows sinus rhythm with first-degree AV block and nonspecific ST-T wave changes.  Appears to be unchanged from previous.  Assessment/Plan Principal Problem:   Angio-edema, initial encounter Active Problems:   Essential hypertension   PAF (paroxysmal atrial fibrillation) (HCC)   ASCVD (arteriosclerotic cardiovascular disease)   S/P TAVR (transcatheter aortic valve replacement)   Pacemaker   CKD (chronic kidney disease), stage III   Reactive airway disease   GERD without esophagitis   Hypokalemia     #1 angioedema: Suspected secondary to ACE inhibitors.  Patient will be taken off ACE inhibitors.  As needed Benadryl with prednisone and Pepcid.  Observe patient closely.  If symptoms resolved we will discharge her home off ACE inhibitors  permanently.  We will also evaluate other medications taken with ACE inhibitor concurrently.  #2 paroxysmal atrial fibrillation: In sinus rhythm now.  Continue home regimen  #3 hypertension: Blood pressure control.  We will have substituted for ACE inhibitor ultimately.  #4 hypokalemia: Replete potassium  #5 GERD: Continue with PPI  #6 reactive airway disease: Stable asymptomatic.   DVT prophylaxis: Heparin Code Status: Full code Family Communication: No family at bedside Disposition Plan: Home Consults  called: None Admission status: Observation  Severity of Illness: The appropriate patient status for this patient is OBSERVATION. Observation status is judged to be reasonable and necessary in order to provide the required intensity of service to ensure the patient's safety. The patient's presenting symptoms, physical exam findings, and initial radiographic and laboratory data in the context of their medical condition is felt to place them at decreased risk for further clinical deterioration. Furthermore, it is anticipated that the patient will be medically stable for discharge from the hospital within 2 midnights of admission. The following factors support the patient status of observation.   " The patient's presenting symptoms include stridor with shortness of breath. " The physical exam findings include mildly inflamed tongue. " The initial radiographic and laboratory data are none.     Barbette Merino MD [facility] Pager [phone]  If 7PM-7AM, please contact night-coverage [email or web address] Password [facility]  08/23/2019, 10:49 PM

## 2019-08-23 NOTE — ED Provider Notes (Signed)
[facility] Provider Note   CSN: [id number] Arrival date & time: 08/23/19  1612     History Chief Complaint  Patient presents with  . Allergic Reaction  . Shortness of Breath    Crystal Cooper is a 86 y.o. female.  Patient is a 86 year old female with a history of CHF, atrial fibrillation, hypertension, COPD, aortic stenosis who presents with shortness of breath.  She states that last night she noted some swelling to her lips and her tongue.  She felt like her tongue was getting thick.  She started having some shortness of breath.  She said that the swelling has gotten better but she still feels like her lips are little swollen and she still feels short of breath.  She is not sure if she had an allergic reaction to something.  She has not had any new medications other than she took some antibiotics for urinary tract infection but she finished her last one about a week ago.  She is on an ACE inhibitor which she has been on for a long time.  She denies any rash or itching.  She used her inhaler and felt a little bit better after that.  She denies any leg swelling.  No fevers.  She has had a cough and some rhinorrhea for about 3 weeks.  No known fevers.  She denies any sore throat.  She is not really sure if she can tell if her throat is swollen.        Past Medical History:  Diagnosis Date  . Anemia    years ago  . Aortic stenosis   . Arthritis   . Asthma   . Atrial fibrillation ([place])   . CHF (congestive heart failure) ([place]) 11/2014  . Family history of adverse reaction to anesthesia    2 daughters would have N/V  . Glaucoma   . Hearing loss   . Heart murmur   . HTN (hypertension)   . Pneumonia   . Prolapsing mitral leaflet syndrome   . Shortness of breath dyspnea    with exertion  . SVT (supraventricular tachycardia) ([facility])    S/P ablation of AVNRT in 2003    Patient Active Problem List   Diagnosis Date Noted  . Reactive airway  disease 03/24/2019  . GERD without esophagitis 03/24/2019  . CKD (chronic kidney disease), stage III 11/12/2018  . GI bleed 05/12/2018  . 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 Diastolic dysfunction 40/98/1191  . Pacemaker 05/12/2018  . Iron deficiency anemia   . Melena   . Angiodysplasia of duodenum   . Gastritis and gastroduodenitis   . Anemia 04/06/2018  . Osteoporosis 08/27/2016  . ASCVD (arteriosclerotic cardiovascular disease) 01/11/2016  . Asthma 01/11/2016  . Glaucoma 01/11/2016  . Hearing loss 01/11/2016  . Other nonrheumatic mitral valve disorders 01/11/2016  . Junctional bradycardia   . PAF (paroxysmal atrial fibrillation) ([place]) 09/06/2015  . Hyponatremia 12/17/2014  . Murmur 10/19/2011  . Chest pain 10/19/2011  . Essential hypertension 10/19/2011  . History of cardiac radiofrequency ablation (RFA) 10/19/2011    Past Surgical History:  Procedure Laterality Date  . APPENDECTOMY    . BIOPSY  04/07/2018   Procedure: BIOPSY;  Surgeon: Jerene Bears, MD;  Location: [facility] ENDOSCOPY;  Service: Gastroenterology;;  . BLADDER SURGERY    . CARDIAC CATHETERIZATION N/A 09/26/2015   Procedure: Right/Left Heart Cath and Coronary Angiography;  Surgeon: Burnell Blanks, MD;  Location: [place]  CV LAB;  Service: Cardiovascular;  Laterality: N/A;  . CARDIAC SURGERY    . CARDIOVERSION N/A 03/02/2016   Procedure: CARDIOVERSION;  Surgeon: Thayer Headings, MD;  Location: [place];  Service: Cardiovascular;  Laterality: N/A;  . EP IMPLANTABLE DEVICE N/A 10/26/2015   Procedure: Pacemaker Implant;  Surgeon: Will Meredith Leeds, MD;  Location: [place] CV LAB;  Service: Cardiovascular;  Laterality: N/A;  . ESOPHAGOGASTRODUODENOSCOPY (EGD) WITH PROPOFOL N/A 04/07/2018   Procedure: ESOPHAGOGASTRODUODENOSCOPY (EGD) WITH PROPOFOL;  Surgeon: Jerene Bears, MD;  Location: [facility] ENDOSCOPY;  Service: Gastroenterology;  Laterality: N/A;  . EYE SURGERY Bilateral     cataract surgery  . HOT HEMOSTASIS N/A 04/07/2018   Procedure: HOT HEMOSTASIS (ARGON PLASMA COAGULATION/BICAP);  Surgeon: Jerene Bears, MD;  Location: [facility] ENDOSCOPY;  Service: Gastroenterology;  Laterality: N/A;  . NASAL SINUS SURGERY    . TEE WITHOUT CARDIOVERSION N/A 10/25/2015   Procedure: TRANSESOPHAGEAL ECHOCARDIOGRAM (TEE);  Surgeon: Burnell Blanks, MD;  Location: [place];  Service: Open Heart Surgery;  Laterality: N/A;  . TRANSCATHETER AORTIC VALVE REPLACEMENT, TRANSFEMORAL Right 10/25/2015   Procedure: TRANSCATHETER AORTIC VALVE REPLACEMENT, TRANSFEMORAL;  Surgeon: Burnell Blanks, MD;  Location: [place];  Service: Open Heart Surgery;  Laterality: Right;     OB History   No obstetric history on file.     Family History  Problem Relation Age of Onset  . Hypertension Mother   . Pneumonia Father   . Heart attack Neg Hx   . Colon cancer Neg Hx   . Esophageal cancer Neg Hx     Social History   Tobacco Use  . Smoking status: Never Smoker  . Smokeless tobacco: Never Used  Substance Use Topics  . Alcohol use: No    Alcohol/week: 0.0 standard drinks  . Drug use: No    Home Medications Prior to Admission medications   Medication Sig Start Date End Date Taking? Authorizing Provider  acetaminophen (TYLENOL) 500 MG tablet Take 500 mg by mouth every 6 (six) hours as needed for mild pain.    [provider]  albuterol (PROAIR HFA) 108 (90 Base) MCG/ACT inhaler Inhale 2 puffs into the lungs every 4 (four) hours as needed for wheezing or shortness of breath. 08/05/19   Baruch Gouty, FNP  amLODipine (NORVASC) 2.5 MG tablet Take 1 tablet (2.5 mg total) by mouth daily. 03/24/19   Baruch Gouty, FNP  Biotin 10 MG CAPS Take 1 capsule by mouth daily.    [provider]  calcium-vitamin D (OSCAL WITH D) 500-200 MG-UNIT tablet Take 1 tablet by mouth daily. 08/27/16   Cherre Robins, PharmD  docusate sodium (COLACE) 100 MG capsule Take 100 mg by mouth daily as needed  for mild constipation.    [provider]  enalapril (VASOTEC) 2.5 MG tablet Take 1 tablet (2.5 mg total) by mouth daily. 06/24/19 09/22/19  Baruch Gouty, FNP  ferrous sulfate 325 (65 FE) MG tablet Take 1 tablet (325 mg total) by mouth 2 (two) times daily with a meal. 04/08/18   Eulogio Bear U, DO  furosemide (LASIX) 40 MG tablet Take 1.5 tablets (60 mg total) by mouth daily. 03/24/19   Baruch Gouty, FNP  metoprolol tartrate (LOPRESSOR) 25 MG tablet Take 1 tablet (25 mg total) by mouth 2 (two) times daily. 05/20/19   Camnitz, Ocie Doyne, MD  multivitamin-iron-minerals-folic acid (CENTRUM) chewable tablet Chew 1 tablet by mouth daily.    [provider]  pantoprazole (PROTONIX) 40 MG  tablet Take 1 tablet (40 mg total) by mouth daily at 6 (six) AM. 03/24/19 06/30/19  Rakes, Connye Burkitt, FNP    Allergies    Hctz [hydrochlorothiazide] and Codeine  Review of Systems   Review of Systems  Constitutional: Negative for chills, diaphoresis, fatigue and fever.  HENT: Positive for congestion, facial swelling and rhinorrhea. Negative for sneezing.   Eyes: Negative.   Respiratory: Positive for cough and shortness of breath. Negative for chest tightness.   Cardiovascular: Negative for chest pain and leg swelling.  Gastrointestinal: Negative for abdominal pain, blood in stool, diarrhea, nausea and vomiting.  Genitourinary: Negative for difficulty urinating, flank pain, frequency and hematuria.  Musculoskeletal: Negative for arthralgias and back pain.  Skin: Negative for rash.  Neurological: Negative for dizziness, speech difficulty, weakness, numbness and headaches.    Physical Exam Updated Vital Signs BP (!) 154/70   Pulse 80   Temp 98.7 F (37.1 C) (Oral)   Resp (!) 28   SpO2 98%   Physical Exam Constitutional:      Appearance: She is well-developed.  HENT:     Head: Normocephalic and atraumatic.     Mouth/Throat:     Comments: No visible angioedema Eyes:     Pupils:  Pupils are equal, round, and reactive to light.  Cardiovascular:     Rate and Rhythm: Normal rate and regular rhythm.     Heart sounds: Normal heart sounds.  Pulmonary:     Effort: Pulmonary effort is normal. No respiratory distress.     Breath sounds: No wheezing or rales.     Comments: Patient has some subtle stridor present.  She has some mild tachypnea Chest:     Chest wall: No tenderness.  Abdominal:     General: Bowel sounds are normal.     Palpations: Abdomen is soft.     Tenderness: There is no abdominal tenderness. There is no guarding or rebound.  Musculoskeletal:        General: Normal range of motion.     Cervical back: Normal range of motion and neck supple. No rigidity.     Comments: 1+ edema to the bilateral lower extremities.  Lymphadenopathy:     Cervical: No cervical adenopathy.  Skin:    General: Skin is warm and dry.     Findings: No rash.  Neurological:     Mental Status: She is alert and oriented to person, place, and time.     ED Results / Procedures / Treatments   Labs (all labs ordered are listed, but only abnormal results are displayed) Labs Reviewed  BASIC METABOLIC PANEL - Abnormal; Notable for the following components:      Result Value   Potassium 3.1 (*)    Chloride 95 (*)    Glucose, Bld 111 (*)    Creatinine, Ser 1.17 (*)    GFR calc non Af Amer 41 (*)    GFR calc Af Amer 48 (*)    All other components within normal limits  CBC - Abnormal; Notable for the following components:   RBC 3.52 (*)    Hemoglobin 11.1 (*)    HCT 34.3 (*)    All other components within normal limits  SARS CORONAVIRUS 2 (TAT 6-24 HRS)    EKG EKG Interpretation  Date/Time:  Sunday August 23 2019 16:18:03 EST Ventricular Rate:  84 PR Interval:  252 QRS Duration: 86 QT Interval:  370 QTC Calculation: 437 R Axis:   43 Text Interpretation: Sinus rhythm with 1st  degree A-V block Cannot rule out Anterior infarct , age undetermined ST & T wave abnormality,  consider inferior ischemia Abnormal ECG since last tracing no significant change Confirmed by Malvin Johns [phone]) on 08/23/2019 4:28:32 PM   Radiology CT Soft Tissue Neck W Contrast  Result Date: 08/23/2019 CLINICAL DATA:  Epiglottitis or tonsillitis suspected. Shortness of breath with sensation of throat swelling. EXAM: CT NECK WITH CONTRAST TECHNIQUE: Multidetector CT imaging of the neck was performed using the standard protocol following the bolus administration of intravenous contrast. CONTRAST:  11mL OMNIPAQUE IOHEXOL 300 MG/ML  SOLN COMPARISON:  Chest CT 07/06/2016. FINDINGS: Pharynx and larynx: No evidence of mass or swelling. No fluid collection or inflammatory changes in the parapharyngeal or retropharyngeal spaces. Salivary glands: No inflammation, mass, or stone. Thyroid: 8 mm hypoattenuating right thyroid nodule for which no follow-up is recommended. Lymph nodes: No enlarged or suspicious lymph nodes in the neck. Vascular: Major vascular structures of the neck are patent. Moderate calcified atherosclerosis at the carotid bifurcations with likely mild proximal right ICA stenosis. Aortic atherosclerosis. Limited intracranial: Unremarkable. Visualized orbits: Not imaged. Mastoids and visualized paranasal sinuses: Clear. Skeleton: Advanced disc and moderate facet degeneration in the cervical spine with severe asymmetric right neural foraminal stenosis at C3-4, C4-5, and C6-7 and severe neural foraminal stenosis bilaterally at C5-6. Multilevel spinal stenosis, greatest at C5-6 where a partially calcified central disc protrusion results in severe spinal stenosis. Upper chest: Motion artifact in the lung apices with biapical pleuroparenchymal scarring. Small ground-glass opacities medially and laterally in the right upper lobe, new from the 2018 chest CT. Chronic branching density in the left upper lobe suggesting mucoid impaction. Partially visualized ICD. Other: None. IMPRESSION: 1. No acute  abnormality identified in the neck. 2. Small ground-glass opacities in the right lung apex with assessment limited by motion artifact, nonspecific though likely infectious or inflammatory. 3. Advanced cervical disc and facet degeneration with severe spinal stenosis at C5-6 and severe multilevel neural foraminal stenosis. 4. Aortic Atherosclerosis (ICD10-I70.0). Electronically Signed   By: Logan Bores M.D.   On: 08/23/2019 18:23   DG Chest Port 1 View  Result Date: 08/23/2019 CLINICAL DATA:  Shortness of breath. Allergic reaction. Aortic stenosis. CHF. EXAM: PORTABLE CHEST 1 VIEW COMPARISON:  04/06/2018 FINDINGS: Pacer. Aortic valve repair. Midline trachea. Mild cardiomegaly. Atherosclerosis in the transverse aorta. No pleural effusion or pneumothorax. Hyperinflation. Moderate interstitial thickening. Left base scarring. IMPRESSION: 1. No acute cardiopulmonary disease. 2. Hyperinflation and interstitial thickening, most consistent with COPD/chronic bronchitis. 3.  Cardiomegaly without congestive failure. 4.  Aortic Atherosclerosis (ICD10-I70.0). Electronically Signed   By: Abigail Miyamoto M.D.   On: 08/23/2019 17:17    Procedures Procedures (including critical care time)  Medications Ordered in ED Medications  methylPREDNISolone sodium succinate (SOLU-MEDROL) 125 mg/2 mL injection 125 mg (125 mg Intravenous Given 08/23/19 1656)  iohexol (OMNIPAQUE) 300 MG/ML solution 50 mL (50 mLs Intravenous Contrast Given 08/23/19 1803)  albuterol (VENTOLIN HFA) 108 (90 Base) MCG/ACT inhaler 2 puff (2 puffs Inhalation Given 08/23/19 1942)    ED Course  I have reviewed the triage vital signs and the nursing notes.  Pertinent labs & imaging results that were available during my care of the patient were reviewed by me and considered in my medical decision making (see chart for details).    MDM Rules/Calculators/A&P                      Patient is a 86 year old female who presents with swelling  to her lips and  tongue.  She had some stridor on initial presentation although she said the swelling had gotten better on her lips and tongue.  She was given dose of Solu-Medrol and monitor carefully.  Her symptoms improve while she was in the ED.  It is unclear what the etiology of this is.  It could be related to ACE inhibitor.  However her symptoms were not clearly allergic/angioedema on arrival.  I did not appreciate any visible swelling although the patient said she felt swollen.  Given this, I did do a CT of her neck which showed no soft tissue masses or visible swelling of the airway.  Chest x-ray is clear without evidence of pneumonia.  Given her symptoms with associated stridor, I felt to be better to admit her for observation.  She has improved quite a bit in the ED.  At this point she has some wheezing and will give her some albuterol.  Will consult the hospitalist for admission.  I spoke with Dr. Jonelle Sidle who will admit the pt.  CRITICAL CARE Performed by: Malvin Johns Total critical care time: 45 minutes Critical care time was exclusive of separately billable procedures and treating other patients. Critical care was necessary to treat or prevent imminent or life-threatening deterioration. Critical care was time spent personally by me on the following activities: development of treatment plan with patient and/or surrogate as well as nursing, discussions with consultants, evaluation of patient's response to treatment, examination of patient, obtaining history from patient or surrogate, ordering and performing treatments and interventions, ordering and review of laboratory studies, ordering and review of radiographic studies, pulse oximetry and re-evaluation of patient's condition.  Final Clinical Impression(s) / ED Diagnoses Final diagnoses:  Angioedema, initial encounter    Rx / DC Orders ED Discharge Orders    None       Malvin Johns, MD 08/23/19 1943

## 2019-08-23 NOTE — ED Notes (Signed)
Attempted to call nursing report.  

## 2019-08-23 NOTE — ED Triage Notes (Signed)
Pt arrives to ED from home with complaints of a possible allergic reaction and shortness of breath starting last night after taking her metoprolol. Patient states her face started swelling and she wasn't able to sleep all night. Patient states the swelling has gotten better today but she is still SOB.

## 2019-08-24 ENCOUNTER — Telehealth: Payer: Self-pay | Admitting: Family Medicine

## 2019-08-24 DIAGNOSIS — T783XXA Angioneurotic edema, initial encounter: Secondary | ICD-10-CM | POA: Diagnosis not present

## 2019-08-24 LAB — COMPREHENSIVE METABOLIC PANEL
ALT: 20 U/L (ref 0–44)
AST: 26 U/L (ref 15–41)
Albumin: 3.2 g/dL — ABNORMAL LOW (ref 3.5–5.0)
Alkaline Phosphatase: 46 U/L (ref 38–126)
Anion gap: 9 (ref 5–15)
BUN: 15 mg/dL (ref 8–23)
CO2: 29 mmol/L (ref 22–32)
Calcium: 8.8 mg/dL — ABNORMAL LOW (ref 8.9–10.3)
Chloride: 96 mmol/L — ABNORMAL LOW (ref 98–111)
Creatinine, Ser: 1.07 mg/dL — ABNORMAL HIGH (ref 0.44–1.00)
GFR calc Af Amer: 53 mL/min — ABNORMAL LOW (ref >60)
GFR calc non Af Amer: 46 mL/min — ABNORMAL LOW (ref >60)
Glucose, Bld: 158 mg/dL — ABNORMAL HIGH (ref 70–99)
Potassium: 3.2 mmol/L — ABNORMAL LOW (ref 3.5–5.1)
Sodium: 134 mmol/L — ABNORMAL LOW (ref 135–145)
Total Bilirubin: 1.6 mg/dL — ABNORMAL HIGH (ref 0.3–1.2)
Total Protein: 6.1 g/dL — ABNORMAL LOW (ref 6.5–8.1)

## 2019-08-24 LAB — SARS CORONAVIRUS 2 (TAT 6-24 HRS): SARS Coronavirus 2: NEGATIVE

## 2019-08-24 LAB — CBC
HCT: 32.6 % — ABNORMAL LOW (ref 36.0–46.0)
Hemoglobin: 10.9 g/dL — ABNORMAL LOW (ref 12.0–15.0)
MCH: 32 pg (ref 26.0–34.0)
MCHC: 33.4 g/dL (ref 30.0–36.0)
MCV: 95.6 fL (ref 80.0–100.0)
Platelets: 172 10*3/uL (ref 150–400)
RBC: 3.41 MIL/uL — ABNORMAL LOW (ref 3.87–5.11)
RDW: 13.1 % (ref 11.5–15.5)
WBC: 6.3 10*3/uL (ref 4.0–10.5)
nRBC: 0 % (ref 0.0–0.2)

## 2019-08-24 LAB — GLUCOSE, CAPILLARY: Glucose-Capillary: 115 mg/dL — ABNORMAL HIGH (ref 70–99)

## 2019-08-24 MED ORDER — POTASSIUM CHLORIDE CRYS ER 20 MEQ PO TBCR
40.0000 meq | EXTENDED_RELEASE_TABLET | Freq: Once | ORAL | Status: AC
  Administered 2019-08-24: 40 meq via ORAL
  Filled 2019-08-24: qty 2

## 2019-08-24 NOTE — Telephone Encounter (Signed)
Appointment scheduled on 09/02/2019 with Monia Pouch, patient and daughter aware.

## 2019-08-24 NOTE — Progress Notes (Signed)
Patient arrived to the unit.  No complaints of pain.  VSS.  Patient placed on telemetry.  Per ED nurse daughter had been called and updated of room number.  Patient stated no need to call daughter again.

## 2019-08-24 NOTE — Discharge Summary (Signed)
Physician Discharge Summary  Crystal Cooper KKX:[id number] DOB: [birth year] DOA: 08/23/2019  PCP: Baruch Gouty, FNP  Admit date: 08/23/2019 Discharge date: 08/24/2019  Admitted From: home Discharge disposition: home   Recommendations for Outpatient Follow-Up:   1. Recommend follow-up with PCP 1 to 2 weeks for evaluation of symptoms.  Recommend monitoring of blood pressure control as ace inhibitor and norvasc have been stopped. Recommend bmet to track potassium level   Discharge Diagnosis:   Principal Problem:   Angio-edema, initial encounter Active Problems:   Essential hypertension   PAF (paroxysmal atrial fibrillation) (HCC)   ASCVD (arteriosclerotic cardiovascular disease)   S/P TAVR (transcatheter aortic valve replacement)   Pacemaker   CKD (chronic kidney disease), stage III   Reactive airway disease   GERD without esophagitis   Hypokalemia    Discharge Condition: Improved.  Diet recommendation: Low sodium, heart healthy.    Wound care: None.  Code status: Full.   History of Present Illness:   Crystal Cooper is a delightful  86 y.o. female with medical history significant of hypertension, reactive airway disease, paroxysmal atrial fibrillation, aortic stenosis status post surgery, hearing loss, diastolic dysfunction CHF who presented 2/28 with 1 day history of shortness of breath facial and tongue swelling since last night.  Patient was on ACE inhibitor and beta-blockers.  Symptoms started after she took her metoprolol the night prior.   She was seen in the ER with mild swelling of her tongue and face.  She had some mild stridor so she was admitted to the hospital with suspected angioedema. She denied any previous facial swelling.  She had what appeared to be atopy with her asthma and reactive airway disease.  She had been on ACE inhibitor for a while.  Patient suspected to have ACE inhibitor induced angioedema and was admitted to the hospital for  treatment.[name]   Hospital Course by Problem:   #1 angioedema: Suspected secondary to ACE inhibitors. CT neck with no acute abnormality in the neck, small ground glass opacities right lung apex with assessment limited by motion likely inflammatory, advanced cervical disc and facet degeneration with severe spinal stenosis at C5-6 and sever multilevel neuroal foraminal stenosis.   Patient was taken off ACE inhibitor. Provided  Benadryl with prednisone and Pepcid. Symptoms resolved quickly. On day of discharge not problems breathing or eating. No edema. Speech clear.  We will also evaluate other medications taken with ACE inhibitor concurrently.  #2 paroxysmal atrial fibrillation: In sinus rhythm now.  Continue home BB  #3 hypertension: Blood pressure controled. Receiving BB but not home ace inhibitor or norvasc. Will discharge on BB only for rate control. Recommend close OP follow up with PCP for evaluation of BP control.   #4 hypokalemia: Repleted and resolved.    GERD: Continue with PPI  #6 reactive airway disease: Stable asymptomatic.     Medical Consultants:      Discharge Exam:   Vitals:   08/24/19 0016 08/24/19 0414  BP: (!) 141/77 (!) 144/75  Pulse: 79 66  Resp: 18 18  Temp: 98 F (36.7 C) 98.1 F (36.7 C)  SpO2: 94% 96%   Vitals:   08/23/19 2138 08/24/19 0016 08/24/19 0414 08/24/19 0600  BP: (!) 152/78 (!) 141/77 (!) 144/75   Pulse: 88 79 66   Resp: 18 18 18    Temp: 98.2 F (36.8 C) 98 F (36.7 C) 98.1 F (36.7 C)   TempSrc: Oral Oral Oral   SpO2: 95% 94%  96%   Weight: 49.9 kg   49.3 kg  Height: 5\' 2"  (1.575 m)       General exam: Appears calm and comfortable.  Sitting up in chair eating breakfast no acute distress Respiratory system: Clear to auscultation. Respiratory effort normal. Cardiovascular system: S1 & S2 heard, RRR. No JVD,  rubs, gallops or clicks. No murmurs. Gastrointestinal system: Abdomen is nondistended, soft and nontender. No organomegaly  or masses felt. Normal bowel sounds heard. Central nervous system: Alert and oriented. No focal neurological deficits. Extremities: No clubbing,  or cyanosis. No edema. Skin: No rashes, lesions or ulcers. Psychiatry: Judgement and insight appear normal. Mood & affect appropriate.    The results of significant diagnostics from this hospitalization (including imaging, microbiology, ancillary and laboratory) are listed below for reference.     Procedures and Diagnostic Studies:   CT Soft Tissue Neck W Contrast  Result Date: 08/23/2019 CLINICAL DATA:  Epiglottitis or tonsillitis suspected. Shortness of breath with sensation of throat swelling. EXAM: CT NECK WITH CONTRAST TECHNIQUE: Multidetector CT imaging of the neck was performed using the standard protocol following the bolus administration of intravenous contrast. CONTRAST:  1mL OMNIPAQUE IOHEXOL 300 MG/ML  SOLN COMPARISON:  Chest CT 07/06/2016. FINDINGS: Pharynx and larynx: No evidence of mass or swelling. No fluid collection or inflammatory changes in the parapharyngeal or retropharyngeal spaces. Salivary glands: No inflammation, mass, or stone. Thyroid: 8 mm hypoattenuating right thyroid nodule for which no follow-up is recommended. Lymph nodes: No enlarged or suspicious lymph nodes in the neck. Vascular: Major vascular structures of the neck are patent. Moderate calcified atherosclerosis at the carotid bifurcations with likely mild proximal right ICA stenosis. Aortic atherosclerosis. Limited intracranial: Unremarkable. Visualized orbits: Not imaged. Mastoids and visualized paranasal sinuses: Clear. Skeleton: Advanced disc and moderate facet degeneration in the cervical spine with severe asymmetric right neural foraminal stenosis at C3-4, C4-5, and C6-7 and severe neural foraminal stenosis bilaterally at C5-6. Multilevel spinal stenosis, greatest at C5-6 where a partially calcified central disc protrusion results in severe spinal stenosis. Upper  chest: Motion artifact in the lung apices with biapical pleuroparenchymal scarring. Small ground-glass opacities medially and laterally in the right upper lobe, new from the 2018 chest CT. Chronic branching density in the left upper lobe suggesting mucoid impaction. Partially visualized ICD. Other: None. IMPRESSION: 1. No acute abnormality identified in the neck. 2. Small ground-glass opacities in the right lung apex with assessment limited by motion artifact, nonspecific though likely infectious or inflammatory. 3. Advanced cervical disc and facet degeneration with severe spinal stenosis at C5-6 and severe multilevel neural foraminal stenosis. 4. Aortic Atherosclerosis (ICD10-I70.0). Electronically Signed   By: Logan Bores M.D.   On: 08/23/2019 18:23   DG Chest Port 1 View  Result Date: 08/23/2019 CLINICAL DATA:  Shortness of breath. Allergic reaction. Aortic stenosis. CHF. EXAM: PORTABLE CHEST 1 VIEW COMPARISON:  04/06/2018 FINDINGS: Pacer. Aortic valve repair. Midline trachea. Mild cardiomegaly. Atherosclerosis in the transverse aorta. No pleural effusion or pneumothorax. Hyperinflation. Moderate interstitial thickening. Left base scarring. IMPRESSION: 1. No acute cardiopulmonary disease. 2. Hyperinflation and interstitial thickening, most consistent with COPD/chronic bronchitis. 3.  Cardiomegaly without congestive failure. 4.  Aortic Atherosclerosis (ICD10-I70.0). Electronically Signed   By: Abigail Miyamoto M.D.   On: 08/23/2019 17:17     Labs:   Basic Metabolic Panel: Recent Labs  Lab 08/23/19 1623 08/24/19 0321  NA 136 134*  K 3.1* 3.2*  CL 95* 96*  CO2 28 29  GLUCOSE 111* 158*  BUN  14 15  CREATININE 1.17* 1.07*  CALCIUM 8.9 8.8*   GFR Estimated Creatinine Clearance: 27.2 mL/min (A) (by C-G formula based on SCr of 1.07 mg/dL (H)). Liver Function Tests: Recent Labs  Lab 08/24/19 0321  AST 26  ALT 20  ALKPHOS 46  BILITOT 1.6*  PROT 6.1*  ALBUMIN 3.2*   No results for input(s):  LIPASE, AMYLASE in the last 168 hours. No results for input(s): AMMONIA in the last 168 hours. Coagulation profile No results for input(s): INR, PROTIME in the last 168 hours.  CBC: Recent Labs  Lab 08/23/19 1623 08/24/19 0321  WBC 9.6 6.3  HGB 11.1* 10.9*  HCT 34.3* 32.6*  MCV 97.4 95.6  PLT 202 172   Cardiac Enzymes: No results for input(s): CKTOTAL, CKMB, CKMBINDEX, TROPONINI in the last 168 hours. BNP: Invalid input(s): POCBNP CBG: Recent Labs  Lab 08/24/19 1138  GLUCAP 115*   D-Dimer No results for input(s): DDIMER in the last 72 hours. Hgb A1c No results for input(s): HGBA1C in the last 72 hours. Lipid Profile No results for input(s): CHOL, HDL, LDLCALC, TRIG, CHOLHDL, LDLDIRECT in the last 72 hours. Thyroid function studies No results for input(s): TSH, T4TOTAL, T3FREE, THYROIDAB in the last 72 hours.  Invalid input(s): FREET3 Anemia work up No results for input(s): VITAMINB12, FOLATE, FERRITIN, TIBC, IRON, RETICCTPCT in the last 72 hours. Microbiology Recent Results (from the past 240 hour(s))  SARS CORONAVIRUS 2 (TAT 6-24 HRS) Nasopharyngeal Nasopharyngeal Swab     Status: None   Collection Time: 08/23/19  7:45 PM   Specimen: Nasopharyngeal Swab  Result Value Ref Range Status   SARS Coronavirus 2 NEGATIVE NEGATIVE Final    Comment: (NOTE) SARS-CoV-2 target nucleic acids are NOT DETECTED. The SARS-CoV-2 RNA is generally detectable in upper and lower respiratory specimens during the acute phase of infection. Negative results do not preclude SARS-CoV-2 infection, do not rule out co-infections with other pathogens, and should not be used as the sole basis for treatment or other patient management decisions. Negative results must be combined with clinical observations, patient history, and epidemiological information. The expected result is Negative. Fact Sheet for Patients: SugarRoll[employer]be Fact Sheet for Healthcare  Providers: https://www.woods-mathews.com/ This test is not yet approved or cleared by the [place] FDA and  has been authorized for detection and/or diagnosis of SARS-CoV-2 by FDA under an Emergency Use Authorization (EUA). This EUA will remain  in effect (meaning this test can be used) for the duration of the COVID-19 declaration under Section 56 4(b)(1) of the Act, 21 U.S.C. section 360bbb-3(b)(1), unless the authorization is terminated or revoked sooner. Performed at [facility], [place] [address], [place], [place] [address]      Discharge Instructions:   Discharge Instructions    Call MD for:  difficulty breathing, headache or visual disturbances   Complete by: As directed    Call MD for:  extreme fatigue   Complete by: As directed    Call MD for:  persistant dizziness or light-headedness   Complete by: As directed    Call MD for:  temperature >100.4   Complete by: As directed    Diet - low sodium heart healthy   Complete by: As directed    Discharge instructions   Complete by: As directed    Take medications as prescribed as some meds stopped Monitor BP daily at same time of day until follow up appointment with PCP. Take BP readings to PCP for evaluation Follow up with PCP 1-2  weeks for evaluation of symptoms and monitoring of BP control   Increase activity slowly   Complete by: As directed      Allergies as of 08/24/2019      Reactions   Hctz [hydrochlorothiazide] Other (See Comments)   Pt was ill and this affected her kidneys   Aspirin Other (See Comments)   Cardiologist said the patient is to not take this   Codeine Other (See Comments)   Made the patient feel ill, has not had any problems since 1977      Medication List    STOP taking these medications   amLODipine 2.5 MG tablet Commonly known as: NORVASC     TAKE these medications   acetaminophen 500 MG tablet Commonly known as: TYLENOL Take 500 mg by mouth every 6 (six) hours as  needed for mild pain or headache.   albuterol 108 (90 Base) MCG/ACT inhaler Commonly known as: ProAir HFA Inhale 2 puffs into the lungs every 4 (four) hours as needed for wheezing or shortness of breath.   Biotin 10 MG Caps Take 10 mg by mouth daily.   calcium-vitamin D 500-200 MG-UNIT tablet Commonly known as: OSCAL WITH D Take 1 tablet by mouth daily.   Caltrate 600+D3 600-800 MG-UNIT Tabs Generic drug: Calcium Carb-Cholecalciferol Take 1 tablet by mouth daily.   docusate sodium 100 MG capsule Commonly known as: COLACE Take 100 mg by mouth daily as needed for mild constipation.   ferrous sulfate 325 (65 FE) MG tablet Take 1 tablet (325 mg total) by mouth 2 (two) times daily with a meal.   furosemide 40 MG tablet Commonly known as: LASIX Take 1.5 tablets (60 mg total) by mouth daily.   metoprolol tartrate 25 MG tablet Commonly known as: LOPRESSOR Take 1 tablet (25 mg total) by mouth 2 (two) times daily.   multivitamin-iron-minerals-folic acid chewable tablet Chew 1 tablet by mouth daily.   pantoprazole 40 MG tablet Commonly known as: PROTONIX Take 1 tablet (40 mg total) by mouth daily at 6 (six) AM.         Time coordinating discharge: 45 minutes  Signed:  Radene Gunning NP  [facility] 08/24/2019, 12:49 PM

## 2019-08-25 ENCOUNTER — Encounter (HOSPITAL_COMMUNITY): Payer: Self-pay | Admitting: Emergency Medicine

## 2019-08-25 ENCOUNTER — Other Ambulatory Visit: Payer: Self-pay

## 2019-08-25 ENCOUNTER — Emergency Department (HOSPITAL_COMMUNITY): Payer: Medicare Other

## 2019-08-25 ENCOUNTER — Inpatient Hospital Stay (HOSPITAL_COMMUNITY)
Admission: EM | Admit: 2019-08-25 | Discharge: 2019-08-30 | DRG: 915 | Disposition: A | Discharge status: Home Health | Payer: Medicare Other | Attending: Internal Medicine | Admitting: Internal Medicine

## 2019-08-25 DIAGNOSIS — T783XXA Angioneurotic edema, initial encounter: Secondary | ICD-10-CM | POA: Diagnosis present

## 2019-08-25 DIAGNOSIS — I480 Paroxysmal atrial fibrillation: Secondary | ICD-10-CM | POA: Diagnosis present

## 2019-08-25 DIAGNOSIS — I639 Cerebral infarction, unspecified: Secondary | ICD-10-CM | POA: Diagnosis not present

## 2019-08-25 DIAGNOSIS — J189 Pneumonia, unspecified organism: Secondary | ICD-10-CM

## 2019-08-25 DIAGNOSIS — J449 Chronic obstructive pulmonary disease, unspecified: Secondary | ICD-10-CM | POA: Diagnosis present

## 2019-08-25 DIAGNOSIS — E876 Hypokalemia: Secondary | ICD-10-CM | POA: Diagnosis present

## 2019-08-25 DIAGNOSIS — I10 Essential (primary) hypertension: Secondary | ICD-10-CM

## 2019-08-25 DIAGNOSIS — I130 Hypertensive heart and chronic kidney disease with heart failure and stage 1 through stage 4 chronic kidney disease, or unspecified chronic kidney disease: Secondary | ICD-10-CM | POA: Diagnosis present

## 2019-08-25 DIAGNOSIS — R195 Other fecal abnormalities: Secondary | ICD-10-CM

## 2019-08-25 DIAGNOSIS — J159 Unspecified bacterial pneumonia: Secondary | ICD-10-CM

## 2019-08-25 DIAGNOSIS — H409 Unspecified glaucoma: Secondary | ICD-10-CM | POA: Diagnosis present

## 2019-08-25 DIAGNOSIS — R42 Dizziness and giddiness: Secondary | ICD-10-CM | POA: Diagnosis not present

## 2019-08-25 DIAGNOSIS — I482 Chronic atrial fibrillation, unspecified: Secondary | ICD-10-CM | POA: Diagnosis not present

## 2019-08-25 DIAGNOSIS — R0603 Acute respiratory distress: Secondary | ICD-10-CM | POA: Diagnosis not present

## 2019-08-25 DIAGNOSIS — N183 Chronic kidney disease, stage 3 unspecified: Secondary | ICD-10-CM

## 2019-08-25 DIAGNOSIS — N1831 Chronic kidney disease, stage 3a: Secondary | ICD-10-CM | POA: Diagnosis present

## 2019-08-25 DIAGNOSIS — G9341 Metabolic encephalopathy: Secondary | ICD-10-CM | POA: Diagnosis present

## 2019-08-25 DIAGNOSIS — M810 Age-related osteoporosis without current pathological fracture: Secondary | ICD-10-CM | POA: Diagnosis present

## 2019-08-25 DIAGNOSIS — I5189 Other ill-defined heart diseases: Secondary | ICD-10-CM

## 2019-08-25 DIAGNOSIS — Z952 Presence of prosthetic heart valve: Secondary | ICD-10-CM | POA: Diagnosis not present

## 2019-08-25 DIAGNOSIS — T50995A Adverse effect of other drugs, medicaments and biological substances, initial encounter: Secondary | ICD-10-CM | POA: Diagnosis present

## 2019-08-25 DIAGNOSIS — Z8673 Personal history of transient ischemic attack (TIA), and cerebral infarction without residual deficits: Secondary | ICD-10-CM

## 2019-08-25 DIAGNOSIS — Z7901 Long term (current) use of anticoagulants: Secondary | ICD-10-CM | POA: Diagnosis not present

## 2019-08-25 DIAGNOSIS — H9190 Unspecified hearing loss, unspecified ear: Secondary | ICD-10-CM | POA: Diagnosis present

## 2019-08-25 DIAGNOSIS — K922 Gastrointestinal hemorrhage, unspecified: Secondary | ICD-10-CM

## 2019-08-25 DIAGNOSIS — K219 Gastro-esophageal reflux disease without esophagitis: Secondary | ICD-10-CM | POA: Diagnosis present

## 2019-08-25 DIAGNOSIS — Z950 Presence of cardiac pacemaker: Secondary | ICD-10-CM | POA: Diagnosis not present

## 2019-08-25 DIAGNOSIS — I350 Nonrheumatic aortic (valve) stenosis: Secondary | ICD-10-CM | POA: Diagnosis not present

## 2019-08-25 DIAGNOSIS — R531 Weakness: Secondary | ICD-10-CM | POA: Diagnosis not present

## 2019-08-25 DIAGNOSIS — E785 Hyperlipidemia, unspecified: Secondary | ICD-10-CM | POA: Diagnosis present

## 2019-08-25 DIAGNOSIS — D509 Iron deficiency anemia, unspecified: Secondary | ICD-10-CM | POA: Diagnosis present

## 2019-08-25 DIAGNOSIS — I5032 Chronic diastolic (congestive) heart failure: Secondary | ICD-10-CM | POA: Diagnosis present

## 2019-08-25 DIAGNOSIS — I6381 Other cerebral infarction due to occlusion or stenosis of small artery: Secondary | ICD-10-CM | POA: Diagnosis not present

## 2019-08-25 DIAGNOSIS — J45909 Unspecified asthma, uncomplicated: Secondary | ICD-10-CM | POA: Diagnosis present

## 2019-08-25 DIAGNOSIS — Z885 Allergy status to narcotic agent status: Secondary | ICD-10-CM

## 2019-08-25 DIAGNOSIS — I2510 Atherosclerotic heart disease of native coronary artery without angina pectoris: Secondary | ICD-10-CM | POA: Diagnosis present

## 2019-08-25 DIAGNOSIS — R29701 NIHSS score 1: Secondary | ICD-10-CM | POA: Diagnosis not present

## 2019-08-25 DIAGNOSIS — I495 Sick sinus syndrome: Secondary | ICD-10-CM | POA: Diagnosis present

## 2019-08-25 DIAGNOSIS — K921 Melena: Secondary | ICD-10-CM | POA: Diagnosis not present

## 2019-08-25 DIAGNOSIS — Z79899 Other long term (current) drug therapy: Secondary | ICD-10-CM

## 2019-08-25 DIAGNOSIS — Z20822 Contact with and (suspected) exposure to covid-19: Secondary | ICD-10-CM | POA: Diagnosis present

## 2019-08-25 DIAGNOSIS — K31819 Angiodysplasia of stomach and duodenum without bleeding: Secondary | ICD-10-CM

## 2019-08-25 DIAGNOSIS — R55 Syncope and collapse: Secondary | ICD-10-CM | POA: Diagnosis not present

## 2019-08-25 DIAGNOSIS — R4182 Altered mental status, unspecified: Secondary | ICD-10-CM | POA: Diagnosis not present

## 2019-08-25 HISTORY — DX: Pneumonia, unspecified organism: J18.9

## 2019-08-25 LAB — PROCALCITONIN: Procalcitonin: <0.1 ng/mL

## 2019-08-25 LAB — COMPREHENSIVE METABOLIC PANEL
ALT: 26 U/L (ref 0–44)
AST: 43 U/L — ABNORMAL HIGH (ref 15–41)
Albumin: 3.5 g/dL (ref 3.5–5.0)
Alkaline Phosphatase: 56 U/L (ref 38–126)
Anion gap: 12 (ref 5–15)
BUN: 24 mg/dL — ABNORMAL HIGH (ref 8–23)
CO2: 25 mmol/L (ref 22–32)
Calcium: 9.1 mg/dL (ref 8.9–10.3)
Chloride: 99 mmol/L (ref 98–111)
Creatinine, Ser: 1.07 mg/dL — ABNORMAL HIGH (ref 0.44–1.00)
GFR calc Af Amer: 53 mL/min — ABNORMAL LOW (ref >60)
GFR calc non Af Amer: 46 mL/min — ABNORMAL LOW (ref >60)
Glucose, Bld: 155 mg/dL — ABNORMAL HIGH (ref 70–99)
Potassium: 3.7 mmol/L (ref 3.5–5.1)
Sodium: 136 mmol/L (ref 135–145)
Total Bilirubin: 1.1 mg/dL (ref 0.3–1.2)
Total Protein: 6.7 g/dL (ref 6.5–8.1)

## 2019-08-25 LAB — URINALYSIS, ROUTINE W REFLEX MICROSCOPIC
Bacteria, UA: NONE SEEN
Bilirubin Urine: NEGATIVE
Glucose, UA: NEGATIVE mg/dL
Ketones, ur: NEGATIVE mg/dL
Leukocytes,Ua: NEGATIVE
Nitrite: NEGATIVE
Protein, ur: NEGATIVE mg/dL
Specific Gravity, Urine: 1.005 (ref 1.005–1.030)
pH: 8 (ref 5.0–8.0)

## 2019-08-25 LAB — CK: Total CK: 263 U/L — ABNORMAL HIGH (ref 38–234)

## 2019-08-25 LAB — CBC
HCT: 37.4 % (ref 36.0–46.0)
Hemoglobin: 12.3 g/dL (ref 12.0–15.0)
MCH: 31.7 pg (ref 26.0–34.0)
MCHC: 32.9 g/dL (ref 30.0–36.0)
MCV: 96.4 fL (ref 80.0–100.0)
Platelets: 226 10*3/uL (ref 150–400)
RBC: 3.88 MIL/uL (ref 3.87–5.11)
RDW: 13.4 % (ref 11.5–15.5)
WBC: 9.9 10*3/uL (ref 4.0–10.5)
nRBC: 0 % (ref 0.0–0.2)

## 2019-08-25 LAB — CBG MONITORING, ED: Glucose-Capillary: 109 mg/dL — ABNORMAL HIGH (ref 70–99)

## 2019-08-25 LAB — TROPONIN I (HIGH SENSITIVITY): Troponin I (High Sensitivity): 29 ng/L — ABNORMAL HIGH (ref <18)

## 2019-08-25 LAB — SARS CORONAVIRUS 2 (TAT 6-24 HRS): SARS Coronavirus 2: NEGATIVE

## 2019-08-25 MED ORDER — DOCUSATE SODIUM 100 MG PO CAPS: 100.0000 mg | ORAL_CAPSULE | Freq: Every day | ORAL | Status: DC | PRN

## 2019-08-25 MED ORDER — ADULT MULTIVITAMIN W/MINERALS CH
1.0000 | ORAL_TABLET | Freq: Every day | ORAL | Status: DC
  Administered 2019-08-25 – 2019-08-30 (×6): 1 via ORAL
  Filled 2019-08-25 (×7): qty 1

## 2019-08-25 MED ORDER — HEPARIN SODIUM (PORCINE) 5000 UNIT/ML IJ SOLN
5000.0000 [IU] | Freq: Two times a day (BID) | INTRAMUSCULAR | Status: DC
  Administered 2019-08-25 – 2019-08-27 (×5): 5000 [IU] via SUBCUTANEOUS
  Filled 2019-08-25 (×5): qty 1

## 2019-08-25 MED ORDER — SODIUM CHLORIDE 0.9 % IV SOLN
1.0000 g | INTRAVENOUS | Status: DC
  Filled 2019-08-25: qty 10

## 2019-08-25 MED ORDER — SODIUM CHLORIDE 0.9% FLUSH
3.0000 mL | Freq: Once | INTRAVENOUS | Status: AC
  Administered 2019-08-25: 3 mL via INTRAVENOUS

## 2019-08-25 MED ORDER — CALCIUM CARB-CHOLECALCIFEROL 600-800 MG-UNIT PO TABS: 1.0000 | ORAL_TABLET | Freq: Every day | ORAL | Status: DC

## 2019-08-25 MED ORDER — BIOTIN 10 MG PO CAPS: 10.0000 mg | ORAL_CAPSULE | Freq: Every day | ORAL | Status: DC

## 2019-08-25 MED ORDER — SODIUM CHLORIDE 0.9 % IV SOLN
1.0000 g | Freq: Once | INTRAVENOUS | Status: AC
  Administered 2019-08-25: 1 g via INTRAVENOUS
  Filled 2019-08-25: qty 10

## 2019-08-25 MED ORDER — FERROUS SULFATE 325 (65 FE) MG PO TABS
325.0000 mg | ORAL_TABLET | Freq: Two times a day (BID) | ORAL | Status: DC
  Administered 2019-08-26 – 2019-08-30 (×8): 325 mg via ORAL
  Filled 2019-08-25 (×10): qty 1

## 2019-08-25 MED ORDER — SODIUM CHLORIDE 0.9 % IV SOLN
INTRAVENOUS | Status: DC
  Administered 2019-08-26: 75 mL/h via INTRAVENOUS

## 2019-08-25 MED ORDER — SODIUM CHLORIDE 0.9 % IV SOLN
500.0000 mg | Freq: Once | INTRAVENOUS | Status: AC
  Administered 2019-08-25: 19:00:00 500 mg via INTRAVENOUS
  Filled 2019-08-25: qty 500

## 2019-08-25 MED ORDER — METOPROLOL TARTRATE 25 MG PO TABS
25.0000 mg | ORAL_TABLET | Freq: Two times a day (BID) | ORAL | Status: DC
  Administered 2019-08-25 – 2019-08-30 (×10): 25 mg via ORAL
  Filled 2019-08-25 (×11): qty 1

## 2019-08-25 MED ORDER — CENTRUM PO CHEW: 1.0000 | CHEWABLE_TABLET | Freq: Every day | ORAL | Status: DC

## 2019-08-25 MED ORDER — CALCIUM CARBONATE-VITAMIN D 500-200 MG-UNIT PO TABS
1.0000 | ORAL_TABLET | Freq: Every day | ORAL | Status: DC
  Administered 2019-08-26 – 2019-08-30 (×5): 1 via ORAL
  Filled 2019-08-25 (×6): qty 1

## 2019-08-25 MED ORDER — SODIUM CHLORIDE 0.9 % IV BOLUS
1000.0000 mL | Freq: Once | INTRAVENOUS | Status: AC
  Administered 2019-08-25: 1000 mL via INTRAVENOUS

## 2019-08-25 MED ORDER — PANTOPRAZOLE SODIUM 40 MG PO TBEC
40.0000 mg | DELAYED_RELEASE_TABLET | Freq: Every day | ORAL | Status: DC
  Administered 2019-08-26 – 2019-08-28 (×3): 40 mg via ORAL
  Filled 2019-08-25 (×3): qty 1

## 2019-08-25 MED ORDER — SODIUM CHLORIDE 0.9 % IV SOLN: 3.0000 g | Freq: Four times a day (QID) | INTRAVENOUS | Status: DC

## 2019-08-25 MED ORDER — HYDRALAZINE HCL 25 MG PO TABS: 25.0000 mg | ORAL_TABLET | Freq: Four times a day (QID) | ORAL | Status: DC | PRN

## 2019-08-25 MED ORDER — SODIUM CHLORIDE 0.9 % IV SOLN
500.0000 mg | INTRAVENOUS | Status: DC
  Filled 2019-08-25: qty 500

## 2019-08-25 MED ORDER — ALBUTEROL SULFATE (2.5 MG/3ML) 0.083% IN NEBU: 3.0000 mL | INHALATION_SOLUTION | RESPIRATORY_TRACT | Status: DC | PRN

## 2019-08-25 MED ORDER — FUROSEMIDE 40 MG PO TABS
60.0000 mg | ORAL_TABLET | Freq: Every day | ORAL | Status: DC
  Administered 2019-08-26: 60 mg via ORAL
  Filled 2019-08-25: qty 1

## 2019-08-25 NOTE — ED Notes (Signed)
Pt eval per dr long and neuro pa , no code stroke called

## 2019-08-25 NOTE — H&P (Addendum)
History and Physical    Crystal Cooper DJS:[id number] DOB: 25-Nov-1926 DOA: 08/25/2019  PCP: Baruch Gouty, FNP   Patient coming from: Home  I have personally briefly reviewed patient's old medical records in [place]  Chief Complaint: AMS  HPI: Crystal Cooper is a 86 y.o. female with medical history significant of hypertension, asthma, paroxysmal atrial fibrillation, aortic stenosis status post surgery, hearing loss, diastolic dysfunction CHF who presented with AMS.  Patient was just hospitalized and discharged yesterday after a episode of angioedema secondary to ACE inhibitor which was discontinued.  Patient lives by herself, and according to the daughter patient has had copious runny nose/postnasal drip for about 3 weeks with occasional cough and occasional short of breath.  This morning patient family called the patient, and the patient did not respond in several occasions.  And eventually patient picked up the phone saying she was on the floor, and she was very confused and very very weak unable to get up herself.  Family arrived at her house, patient was confused and could not answer how much time she was on the floor. ED Course: Patient was found to have a generalized weakness, neurology was consulted, x-ray showed bilateral upper lobes infiltrates suspect for pneumonia.  Review of Systems: Unable to perform patient still somewhat confused and very hard of hearing.   Past Medical History:  Diagnosis Date  . Anemia    years ago  . Aortic stenosis   . Arthritis   . Asthma   . Atrial fibrillation ([place])   . CHF (congestive heart failure) ([place]) 11/2014  . Family history of adverse reaction to anesthesia    2 daughters would have N/V  . Glaucoma   . Hearing loss   . Heart murmur   . HTN (hypertension)   . Pneumonia   . Prolapsing mitral leaflet syndrome   . Shortness of breath dyspnea    with exertion  . SVT (supraventricular tachycardia) ([facility])    S/P ablation of AVNRT  in 2003    Past Surgical History:  Procedure Laterality Date  . APPENDECTOMY    . BIOPSY  04/07/2018   Procedure: BIOPSY;  Surgeon: Jerene Bears, MD;  Location: [facility] ENDOSCOPY;  Service: Gastroenterology;;  . BLADDER SURGERY    . CARDIAC CATHETERIZATION N/A 09/26/2015   Procedure: Right/Left Heart Cath and Coronary Angiography;  Surgeon: Burnell Blanks, MD;  Location: [place] CV LAB;  Service: Cardiovascular;  Laterality: N/A;  . CARDIAC SURGERY    . CARDIOVERSION N/A 03/02/2016   Procedure: CARDIOVERSION;  Surgeon: Thayer Headings, MD;  Location: [place];  Service: Cardiovascular;  Laterality: N/A;  . EP IMPLANTABLE DEVICE N/A 10/26/2015   Procedure: Pacemaker Implant;  Surgeon: Will Meredith Leeds, MD;  Location: [place] CV LAB;  Service: Cardiovascular;  Laterality: N/A;  . ESOPHAGOGASTRODUODENOSCOPY (EGD) WITH PROPOFOL N/A 04/07/2018   Procedure: ESOPHAGOGASTRODUODENOSCOPY (EGD) WITH PROPOFOL;  Surgeon: Jerene Bears, MD;  Location: [facility] ENDOSCOPY;  Service: Gastroenterology;  Laterality: N/A;  . EYE SURGERY Bilateral    cataract surgery  . HOT HEMOSTASIS N/A 04/07/2018   Procedure: HOT HEMOSTASIS (ARGON PLASMA COAGULATION/BICAP);  Surgeon: Jerene Bears, MD;  Location: [facility] ENDOSCOPY;  Service: Gastroenterology;  Laterality: N/A;  . NASAL SINUS SURGERY    . TEE WITHOUT CARDIOVERSION N/A 10/25/2015   Procedure: TRANSESOPHAGEAL ECHOCARDIOGRAM (TEE);  Surgeon: Burnell Blanks, MD;  Location: [place];  Service: Open Heart Surgery;  Laterality: N/A;  . TRANSCATHETER AORTIC VALVE REPLACEMENT,  TRANSFEMORAL Right 10/25/2015   Procedure: TRANSCATHETER AORTIC VALVE REPLACEMENT, TRANSFEMORAL;  Surgeon: Burnell Blanks, MD;  Location: [place];  Service: Open Heart Surgery;  Laterality: Right;     reports that she has never smoked. She has never used smokeless tobacco. She reports that she does not drink alcohol or use drugs.  Allergies  Allergen Reactions  . Hctz  [Hydrochlorothiazide] Other (See Comments)    Pt was ill and this affected her kidneys   . Aspirin Other (See Comments)    Cardiologist said the patient is to not take this  . Codeine Other (See Comments)    Made the patient feel ill, has not had any problems since 1977    Family History  Problem Relation Age of Onset  . Hypertension Mother   . Pneumonia Father   . Heart attack Neg Hx   . Colon cancer Neg Hx   . Esophageal cancer Neg Hx      Prior to Admission medications   Medication Sig Start Date End Date Taking? Authorizing Provider  acetaminophen (TYLENOL) 500 MG tablet Take 500 mg by mouth every 6 (six) hours as needed for mild pain or headache.     [provider]  albuterol (PROAIR HFA) 108 (90 Base) MCG/ACT inhaler Inhale 2 puffs into the lungs every 4 (four) hours as needed for wheezing or shortness of breath. 08/05/19   Baruch Gouty, FNP  Biotin 10 MG CAPS Take 10 mg by mouth daily.     [provider]  Calcium Carb-Cholecalciferol (CALTRATE 600+D3) 600-800 MG-UNIT TABS Take 1 tablet by mouth daily.    [provider]  calcium-vitamin D (OSCAL WITH D) 500-200 MG-UNIT tablet Take 1 tablet by mouth daily. Patient not taking: Reported on 08/23/2019 08/27/16   Cherre Robins, PharmD  docusate sodium (COLACE) 100 MG capsule Take 100 mg by mouth daily as needed for mild constipation.    [provider]  ferrous sulfate 325 (65 FE) MG tablet Take 1 tablet (325 mg total) by mouth 2 (two) times daily with a meal. 04/08/18   Vann, Jessica U, DO  furosemide (LASIX) 40 MG tablet Take 1.5 tablets (60 mg total) by mouth daily. 03/24/19   Baruch Gouty, FNP  metoprolol tartrate (LOPRESSOR) 25 MG tablet Take 1 tablet (25 mg total) by mouth 2 (two) times daily. 05/20/19   Camnitz, Ocie Doyne, MD  multivitamin-iron-minerals-folic acid (CENTRUM) chewable tablet Chew 1 tablet by mouth daily.    [provider]  pantoprazole (PROTONIX) 40 MG tablet  Take 1 tablet (40 mg total) by mouth daily at 6 (six) AM. 03/24/19 08/23/19  Baruch Gouty, FNP    Physical Exam: Vitals:   08/25/19 1715 08/25/19 1811 08/25/19 1830 08/25/19 1900  BP: (!) 152/68 (!) 148/72 (!) 157/68 (!) 161/66  Pulse: 60 60 (!) 59 (!) 58  Resp: 16 16 (!) 21 18  Temp:      SpO2: 100% 100% 99% 100%  Weight:      Height:        Constitutional: NAD, calm, comfortable Vitals:   08/25/19 1715 08/25/19 1811 08/25/19 1830 08/25/19 1900  BP: (!) 152/68 (!) 148/72 (!) 157/68 (!) 161/66  Pulse: 60 60 (!) 59 (!) 58  Resp: 16 16 (!) 21 18  Temp:      SpO2: 100% 100% 99% 100%  Weight:      Height:       Eyes: PERRL, lids and conjunctivae normal ENMT: Mucous membranes are  moist. Posterior pharynx clear of any exudate or lesions.Normal dentition.  Neck: normal, supple, no masses, no thyromegaly Respiratory: clear to auscultation bilaterally, no wheezing, no crackles. Normal respiratory effort. No accessory muscle use.  Cardiovascular: Regular rate and rhythm, soft murmur on heart base. No extremity edema. 2+ pedal pulses. No carotid bruits.  Abdomen: no tenderness, no masses palpated. No hepatosplenomegaly. Bowel sounds positive.  Musculoskeletal: no clubbing / cyanosis. No joint deformity upper and lower extremities. Good ROM, no contractures. Normal muscle tone.  Skin: no rashes, lesions, ulcers. No induration Neurologic: Following simple commands, moving all limbs, no facial droops or tongue deviation Psychiatric: Respond properly   Labs on Admission: I have personally reviewed following labs and imaging studies  CBC: Recent Labs  Lab 08/23/19 1623 08/24/19 0321 08/25/19 1525  WBC 9.6 6.3 9.9  HGB 11.1* 10.9* 12.3  HCT 34.3* 32.6* 37.4  MCV 97.4 95.6 96.4  PLT 202 172 151   Basic Metabolic Panel: Recent Labs  Lab 08/23/19 1623 08/24/19 0321 08/25/19 1525  NA 136 134* 136  K 3.1* 3.2* 3.7  CL 95* 96* 99  CO2 28 29 25   GLUCOSE 111* 158* 155*  BUN 14  15 24*  CREATININE 1.17* 1.07* 1.07*  CALCIUM 8.9 8.8* 9.1   GFR: Estimated Creatinine Clearance: 27.3 mL/min (A) (by C-G formula based on SCr of 1.07 mg/dL (H)). Liver Function Tests: Recent Labs  Lab 08/24/19 0321 08/25/19 1525  AST 26 43*  ALT 20 26  ALKPHOS 46 56  BILITOT 1.6* 1.1  PROT 6.1* 6.7  ALBUMIN 3.2* 3.5   No results for input(s): LIPASE, AMYLASE in the last 168 hours. No results for input(s): AMMONIA in the last 168 hours. Coagulation Profile: No results for input(s): INR, PROTIME in the last 168 hours. Cardiac Enzymes: Recent Labs  Lab 08/25/19 1525  CKTOTAL 263*   BNP (last 3 results) No results for input(s): PROBNP in the last 8760 hours. HbA1C: No results for input(s): HGBA1C in the last 72 hours. CBG: Recent Labs  Lab 08/24/19 1138 08/25/19 1628  GLUCAP 115* 109*   Lipid Profile: No results for input(s): CHOL, HDL, LDLCALC, TRIG, CHOLHDL, LDLDIRECT in the last 72 hours. Thyroid Function Tests: No results for input(s): TSH, T4TOTAL, FREET4, T3FREE, THYROIDAB in the last 72 hours. Anemia Panel: No results for input(s): VITAMINB12, FOLATE, FERRITIN, TIBC, IRON, RETICCTPCT in the last 72 hours. Urine analysis:    Component Value Date/Time   COLORURINE COLORLESS (A) 08/25/2019 1615   APPEARANCEUR CLEAR 08/25/2019 1615   APPEARANCEUR Clear 08/03/2019 1620   LABSPEC 1.005 08/25/2019 1615   PHURINE 8.0 08/25/2019 1615   GLUCOSEU NEGATIVE 08/25/2019 1615   HGBUR SMALL (A) 08/25/2019 1615   BILIRUBINUR NEGATIVE 08/25/2019 1615   BILIRUBINUR Negative 08/03/2019 1620   KETONESUR NEGATIVE 08/25/2019 1615   PROTEINUR NEGATIVE 08/25/2019 1615   NITRITE NEGATIVE 08/25/2019 1615   LEUKOCYTESUR NEGATIVE 08/25/2019 1615    Radiological Exams on Admission: CT Head Wo Contrast  Result Date: 08/25/2019 CLINICAL DATA:  Altered mental status. Encephalopathy. Patient was found down. EXAM: CT HEAD WITHOUT CONTRAST TECHNIQUE: Contiguous axial images were  obtained from the base of the skull through the vertex without intravenous contrast. COMPARISON:  None. FINDINGS: Brain: No evidence of acute infarction, hemorrhage, hydrocephalus, extra-axial collection or mass lesion/mass effect. There multiple old bilateral lacunar infarcts, more prominent on the right than the left. Periventricular white matter lucency consistent with small vessel ischemic disease. Diffuse mild cerebral cortical atrophy with secondary ventricular dilatation.  Vascular: No hyperdense vessel or unexpected calcification. Skull: Normal. Negative for fracture or focal lesion. Sinuses/Orbits: Normal. Other: None IMPRESSION: No acute abnormalities. 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 Electronically Signed   By: Lorriane Shire M.D.   On: 08/25/2019 17:55   DG Chest Port 1 View  Result Date: 08/25/2019 CLINICAL DATA:  Altered mental status.  Found on floor EXAM: PORTABLE CHEST 1 VIEW COMPARISON:  08/23/2019 FINDINGS: TAVR unchanged in position.  Dual lead pacemaker unchanged. Negative for heart failure.  No pleural effusion. Streaky markings in the lung apices have developed since the prior study and could be due to pneumonia. Follow-up recommended. IMPRESSION: Possible upper lobe infiltrates bilaterally. Electronically Signed   By: Franchot Gallo M.D.   On: 08/25/2019 16:20    EKG: Independently reviewed.  Paced rhythm  Assessment/Plan Active Problems:   Community acquired pneumonia   PNA (pneumonia)  Acute bacterial pneumonia Suspect from aspiration/postnasal drip We will rule out other atypical pneumonia given the nature of bilateral involvement, send Legionella antigen, mycoplasma, and strep pneumonia antigen. She does not have any history of TB contact, but given the nature of bilateral upper lobe involvement I will send a QuantiFERON test CURB 65=2, put her in a moderate risk group, warrant in hospital stay and short course of IV ABX. NPO and speech  evaluation  Acute metabolic encephalopathy From pneumonia likely Reevaluate in the morning  Question of syncope Related to weakness and pneumonia Cardiology wise she has a pacemaker, working fine She also has a biosynthetic aortic valve status post TAVR procedure, echo done last year showed stable structure PT evaluation and check orthostatic vital signs  Question of CVA Daughter reported patient has paroxysmal A. fib and sick sinus On Eliquis but stopped due to repeated GI bleed issue. Neurology on board and ordered MRI.  HTN IVF today, resume Lasix tomorrow PRN Hydralazine  DVT prophylaxis: Subcu heparin Code Status: Full code Family Communication: Daughter at bedside Disposition Plan: Karenann Cai will need 2 to 3 days hospital stay until overall condition improved, family desire home care service, transition care team consulted Consults called: Neurology PA is to see patient Admission status: Telemetry admission   Lequita Halt MD [facility] Pager [phone]    08/25/2019, 7:07 PM

## 2019-08-25 NOTE — ED Provider Notes (Addendum)
[facility] EMERGENCY DEPARTMENT Provider Note   CSN: [id number] Arrival date & time: 08/25/19  1514     History Chief Complaint  Patient presents with  . Altered Mental Status    Crystal Cooper is a 86 y.o. female.  86 yo F with a chief complaint of bilateral lower extremity weakness.  Patient was just in the hospital for angioedema.  She was discharged home and yesterday was not answering phone calls.  Eventually answer the phone and told a family member that she was on the floor.  Patient is unsure exactly how she ended up there she thinks that she scooted off the bed.  She denies falling.  Denies injury.  Denies chest pain or shortness of breath or headache or neck pain.  Denies one-sided numbness or weakness denies difficulty with speech or swallowing.  The history is provided by the patient.  Altered Mental Status Severity:  Moderate Most recent episode:  Yesterday Episode history:  Continuous Duration:  2 days Timing:  Constant Progression:  Worsening Chronicity:  New Associated symptoms: weakness (generalized)   Associated symptoms: no fever, no headaches, no nausea, no palpitations and no vomiting        Past Medical History:  Diagnosis Date  . Anemia    years ago  . Aortic stenosis   . Arthritis   . Asthma   . Atrial fibrillation ([place])   . CHF (congestive heart failure) ([place]) 11/2014  . Family history of adverse reaction to anesthesia    2 daughters would have N/V  . Glaucoma   . Hearing loss   . Heart murmur   . HTN (hypertension)   . Pneumonia   . Prolapsing mitral leaflet syndrome   . Shortness of breath dyspnea    with exertion  . SVT (supraventricular tachycardia) ([facility])    S/P ablation of AVNRT in 2003    Patient Active Problem List   Diagnosis Date Noted  . Community acquired pneumonia 08/25/2019  . PNA (pneumonia) 08/25/2019  . Hypokalemia 08/23/2019  . Angio-edema, initial encounter 08/23/2019  . Reactive airway disease  03/24/2019  . GERD without esophagitis 03/24/2019  . CKD (chronic kidney disease), stage III 11/12/2018  . GI bleed 05/12/2018  . 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 Diastolic dysfunction 83/66/2947  . Pacemaker 05/12/2018  . Iron deficiency anemia   . Melena   . Angiodysplasia of duodenum   . Gastritis and gastroduodenitis   . Anemia 04/06/2018  . Osteoporosis 08/27/2016  . ASCVD (arteriosclerotic cardiovascular disease) 01/11/2016  . Asthma 01/11/2016  . Glaucoma 01/11/2016  . Hearing loss 01/11/2016  . Other nonrheumatic mitral valve disorders 01/11/2016  . Junctional bradycardia   . PAF (paroxysmal atrial fibrillation) ([place]) 09/06/2015  . Hyponatremia 12/17/2014  . Murmur 10/19/2011  . Chest pain 10/19/2011  . Essential hypertension 10/19/2011  . History of cardiac radiofrequency ablation (RFA) 10/19/2011    Past Surgical History:  Procedure Laterality Date  . APPENDECTOMY    . BIOPSY  04/07/2018   Procedure: BIOPSY;  Surgeon: Jerene Bears, MD;  Location: [facility] ENDOSCOPY;  Service: Gastroenterology;;  . BLADDER SURGERY    . CARDIAC CATHETERIZATION N/A 09/26/2015   Procedure: Right/Left Heart Cath and Coronary Angiography;  Surgeon: Burnell Blanks, MD;  Location: [facility] CV LAB;  Service: Cardiovascular;  Laterality: N/A;  . CARDIAC SURGERY    . CARDIOVERSION N/A 03/02/2016   Procedure: CARDIOVERSION;  Surgeon: Thayer Headings, MD;  Location: [facility];  Service: Cardiovascular;  Laterality: N/A;  . EP IMPLANTABLE DEVICE N/A 10/26/2015   Procedure: Pacemaker Implant;  Surgeon: Will Meredith Leeds, MD;  Location: [place] CV LAB;  Service: Cardiovascular;  Laterality: N/A;  . ESOPHAGOGASTRODUODENOSCOPY (EGD) WITH PROPOFOL N/A 04/07/2018   Procedure: ESOPHAGOGASTRODUODENOSCOPY (EGD) WITH PROPOFOL;  Surgeon: Jerene Bears, MD;  Location: [facility] ENDOSCOPY;  Service: Gastroenterology;  Laterality: N/A;  . EYE SURGERY Bilateral    cataract  surgery  . HOT HEMOSTASIS N/A 04/07/2018   Procedure: HOT HEMOSTASIS (ARGON PLASMA COAGULATION/BICAP);  Surgeon: Jerene Bears, MD;  Location: [facility] ENDOSCOPY;  Service: Gastroenterology;  Laterality: N/A;  . NASAL SINUS SURGERY    . TEE WITHOUT CARDIOVERSION N/A 10/25/2015   Procedure: TRANSESOPHAGEAL ECHOCARDIOGRAM (TEE);  Surgeon: Burnell Blanks, MD;  Location: [facility];  Service: Open Heart Surgery;  Laterality: N/A;  . TRANSCATHETER AORTIC VALVE REPLACEMENT, TRANSFEMORAL Right 10/25/2015   Procedure: TRANSCATHETER AORTIC VALVE REPLACEMENT, TRANSFEMORAL;  Surgeon: Burnell Blanks, MD;  Location: [place];  Service: Open Heart Surgery;  Laterality: Right;     OB History   No obstetric history on file.     Family History  Problem Relation Age of Onset  . Hypertension Mother   . Pneumonia Father   . Heart attack Neg Hx   . Colon cancer Neg Hx   . Esophageal cancer Neg Hx     Social History   Tobacco Use  . Smoking status: Never Smoker  . Smokeless tobacco: Never Used  Substance Use Topics  . Alcohol use: No    Alcohol/week: 0.0 standard drinks  . Drug use: No    Home Medications Prior to Admission medications   Medication Sig Start Date End Date Taking? Authorizing Provider  acetaminophen (TYLENOL) 500 MG tablet Take 500 mg by mouth every 6 (six) hours as needed for mild pain or headache.     [provider]  albuterol (PROAIR HFA) 108 (90 Base) MCG/ACT inhaler Inhale 2 puffs into the lungs every 4 (four) hours as needed for wheezing or shortness of breath. 08/05/19   Baruch Gouty, FNP  Biotin 10 MG CAPS Take 10 mg by mouth daily.     [provider]  Calcium Carb-Cholecalciferol (CALTRATE 600+D3) 600-800 MG-UNIT TABS Take 1 tablet by mouth daily.    [provider]  calcium-vitamin D (OSCAL WITH D) 500-200 MG-UNIT tablet Take 1 tablet by mouth daily. Patient not taking: Reported on 08/23/2019 08/27/16   Cherre Robins, PharmD  docusate sodium  (COLACE) 100 MG capsule Take 100 mg by mouth daily as needed for mild constipation.    [provider]  ferrous sulfate 325 (65 FE) MG tablet Take 1 tablet (325 mg total) by mouth 2 (two) times daily with a meal. 04/08/18   Vann, Jessica U, DO  furosemide (LASIX) 40 MG tablet Take 1.5 tablets (60 mg total) by mouth daily. 03/24/19   Baruch Gouty, FNP  metoprolol tartrate (LOPRESSOR) 25 MG tablet Take 1 tablet (25 mg total) by mouth 2 (two) times daily. 05/20/19   Camnitz, Ocie Doyne, MD  multivitamin-iron-minerals-folic acid (CENTRUM) chewable tablet Chew 1 tablet by mouth daily.    [provider]  pantoprazole (PROTONIX) 40 MG tablet Take 1 tablet (40 mg total) by mouth daily at 6 (six) AM. 03/24/19 08/23/19  Rakes, Connye Burkitt, FNP    Allergies    Hctz [hydrochlorothiazide], Aspirin, and Codeine  Review of Systems   Review of Systems  Constitutional: Negative for chills and  fever.  HENT: Negative for congestion and rhinorrhea.   Eyes: Negative for redness and visual disturbance.  Respiratory: Negative for shortness of breath and wheezing.   Cardiovascular: Negative for chest pain and palpitations.  Gastrointestinal: Negative for nausea and vomiting.  Genitourinary: Negative for dysuria and urgency.  Musculoskeletal: Negative for arthralgias and myalgias.  Skin: Negative for pallor and wound.  Neurological: Positive for weakness (generalized). Negative for dizziness and headaches.    Physical Exam Updated Vital Signs BP (!) 161/66   Pulse (!) 58   Temp 97.8 F (36.6 C)   Resp 18   Ht 5\' 2"  (1.575 m)   Wt 49.4 kg   SpO2 100%   BMI 19.94 kg/m   Physical Exam Vitals and nursing note reviewed.  Constitutional:      General: She is not in acute distress.    Appearance: She is well-developed. She is not diaphoretic.  HENT:     Head: Normocephalic and atraumatic.  Eyes:     Pupils: Pupils are equal, round, and reactive to light.  Cardiovascular:     Rate and  Rhythm: Normal rate and regular rhythm.     Heart sounds: No murmur. No friction rub. No gallop.   Pulmonary:     Effort: Pulmonary effort is normal.     Breath sounds: No wheezing or rales.  Abdominal:     General: There is no distension.     Palpations: Abdomen is soft.     Tenderness: There is no abdominal tenderness.  Musculoskeletal:        General: No tenderness.     Cervical back: Normal range of motion and neck supple.  Skin:    General: Skin is warm and dry.  Neurological:     Mental Status: She is alert and oriented to person, place, and time.     Comments: May be diminished far look to the left upper visual region.  Finger-to-nose patient pointed her finger initially to my finger and then afterwards always went into the upper left visual field.  There was no ataxia to her finger. She was unable to keep her leg lifted to the   Psychiatric:        Behavior: Behavior normal.     ED Results / Procedures / Treatments   Labs (all labs ordered are listed, but only abnormal results are displayed) Labs Reviewed  COMPREHENSIVE METABOLIC PANEL - Abnormal; Notable for the following components:      Result Value   Glucose, Bld 155 (*)    BUN 24 (*)    Creatinine, Ser 1.07 (*)    AST 43 (*)    GFR calc non Af Amer 46 (*)    GFR calc Af Amer 53 (*)    All other components within normal limits  CK - Abnormal; Notable for the following components:   Total CK 263 (*)    All other components within normal limits  URINALYSIS, ROUTINE W REFLEX MICROSCOPIC - Abnormal; Notable for the following components:   Color, Urine COLORLESS (*)    Hgb urine dipstick SMALL (*)    All other components within normal limits  CBG MONITORING, ED - Abnormal; Notable for the following components:   Glucose-Capillary 109 (*)    All other components within normal limits  TROPONIN I (HIGH SENSITIVITY) - Abnormal; Notable for the following components:   Troponin I (High Sensitivity) 29 (*)    All other  components within normal limits  CULTURE, BLOOD (ROUTINE X 2)  CULTURE, BLOOD (ROUTINE X 2)  SARS CORONAVIRUS 2 (TAT 6-24 HRS)  CBC  PROCALCITONIN  PROCALCITONIN  LEGIONELLA PNEUMOPHILA SEROGP 1 UR AG  MYCOPLASMA PNEUMONIAE ANTIBODY, IGM  STREP PNEUMONIAE URINARY ANTIGEN  QUANTIFERON-TB GOLD PLUS    EKG EKG Interpretation  Date/Time:  Tuesday August 25 2019 16:16:35 EST Ventricular Rate:  60 PR Interval:    QRS Duration: 156 QT Interval:  482 QTC Calculation: 482 R Axis:   -74 Text Interpretation: A-V dual-paced rhythm with some inhibition No further analysis attempted due to paced rhythm No significant change since last tracing Confirmed by Deno Etienne [phone]) on 08/25/2019 4:42:09 PM   Radiology CT Head Wo Contrast  Result Date: 08/25/2019 CLINICAL DATA:  Altered mental status. Encephalopathy. Patient was found down. EXAM: CT HEAD WITHOUT CONTRAST TECHNIQUE: Contiguous axial images were obtained from the base of the skull through the vertex without intravenous contrast. COMPARISON:  None. FINDINGS: Brain: No evidence of acute infarction, hemorrhage, hydrocephalus, extra-axial collection or mass lesion/mass effect. There multiple old bilateral lacunar infarcts, more prominent on the right than the left. Periventricular white matter lucency consistent with small vessel ischemic disease. Diffuse mild cerebral cortical atrophy with secondary ventricular dilatation. Vascular: No hyperdense vessel or unexpected calcification. Skull: Normal. Negative for fracture or focal lesion. Sinuses/Orbits: Normal. Other: None IMPRESSION: No acute abnormalities. 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 Electronically Signed   By: Lorriane Shire M.D.   On: 08/25/2019 17:55   DG Chest Port 1 View  Result Date: 08/25/2019 CLINICAL DATA:  Altered mental status.  Found on floor EXAM: PORTABLE CHEST 1 VIEW COMPARISON:  08/23/2019 FINDINGS: TAVR unchanged in position.  Dual lead pacemaker  unchanged. Negative for heart failure.  No pleural effusion. Streaky markings in the lung apices have developed since the prior study and could be due to pneumonia. Follow-up recommended. IMPRESSION: Possible upper lobe infiltrates bilaterally. Electronically Signed   By: Franchot Gallo M.D.   On: 08/25/2019 16:20    Procedures Procedures (including critical care time)  Medications Ordered in ED Medications  azithromycin (ZITHROMAX) 500 mg in sodium chloride 0.9 % 250 mL IVPB (500 mg Intravenous New Bag/Given 08/25/19 1925)  metoprolol tartrate (LOPRESSOR) tablet 25 mg (has no administration in time range)  furosemide (LASIX) tablet 60 mg (has no administration in time range)  docusate sodium (COLACE) capsule 100 mg (has no administration in time range)  pantoprazole (PROTONIX) EC tablet 40 mg (has no administration in time range)  ferrous sulfate tablet 325 mg (has no administration in time range)  Biotin CAPS 10 mg (has no administration in time range)  Calcium Carb-Cholecalciferol 600-800 MG-UNIT TABS 1 tablet (has no administration in time range)  calcium-vitamin D (OSCAL WITH D) 500-200 MG-UNIT per tablet 1 tablet (has no administration in time range)  multivitamin-iron-minerals-folic acid (CENTRUM) chewable tablet 1 tablet (has no administration in time range)  albuterol (VENTOLIN HFA) 108 (90 Base) MCG/ACT inhaler 2 puff (has no administration in time range)  heparin injection 5,000 Units (has no administration in time range)  0.9 %  sodium chloride infusion ( Intravenous New Bag/Given 08/25/19 1941)  azithromycin (ZITHROMAX) 500 mg in sodium chloride 0.9 % 250 mL IVPB (has no administration in time range)  hydrALAZINE (APRESOLINE) tablet 25 mg (has no administration in time range)  cefTRIAXone (ROCEPHIN) 1 g in sodium chloride 0.9 % 100 mL IVPB (has no administration in time range)  sodium chloride flush (NS) 0.9 % injection 3 mL (3 mLs Intravenous Given 08/25/19  1608)  sodium chloride 0.9  % bolus 1,000 mL (0 mLs Intravenous Stopped 08/25/19 1906)  cefTRIAXone (ROCEPHIN) 1 g in sodium chloride 0.9 % 100 mL IVPB (0 g Intravenous Stopped 08/25/19 1912)    ED Course  I have reviewed the triage vital signs and the nursing notes.  Pertinent labs & imaging results that were available during my care of the patient were reviewed by me and considered in my medical decision making (see chart for details).    MDM Rules/Calculators/A&P                      86 yo F with a chief complaints of lower extremity weakness and difficulty with ambulation.  Started after she was discharged in the hospital yesterday.  We will get the patient's note there is no PT evaluation done in the hospital.  We will have case management/social work evaluate.  Laboratory evaluation chest x-ray urine bolus of IV fluids ambulate in the room and reassess.  Chest x-ray viewed by me with new infiltrates to bilateral upper lobes.  I discussed this with the family and they have been concerned because she has been coughing quite a bit.  They are concerned when she initially came to the hospital but the x-ray was negative.  We will start the patient on antibiotics.  As she is too weak to function independently will discuss with the hospitalist for possible admission.  I did discuss the patient's case with Dr. Lorraine Lax, neurology they did evaluate the patient very briefly on arrival and determined her not to be a code stroke candidate.  I discussed my neurologic exam with him and he recommended an MRI of the brain to further evaluate.  Will discuss with hospitalist.   Due to the [facility] pacemaker unable to obtain MRI until tomorrow morning.  7:54 PM:  I have discussed the diagnosis/risks/treatment options with the patient and believe the pt to be eligible for discharge home to follow-up with PCP. We also discussed returning to the ED immediately if new or worsening sx occur. We discussed the sx which are most concerning (e.g.,  sudden worsening pain, fever, inability to tolerate by mouth) that necessitate immediate return. Medications administered to the patient during their visit and any new prescriptions provided to the patient are listed below.  Medications given during this visit Medications  azithromycin (ZITHROMAX) 500 mg in sodium chloride 0.9 % 250 mL IVPB (500 mg Intravenous New Bag/Given 08/25/19 1925)  metoprolol tartrate (LOPRESSOR) tablet 25 mg (has no administration in time range)  furosemide (LASIX) tablet 60 mg (has no administration in time range)  docusate sodium (COLACE) capsule 100 mg (has no administration in time range)  pantoprazole (PROTONIX) EC tablet 40 mg (has no administration in time range)  ferrous sulfate tablet 325 mg (has no administration in time range)  Biotin CAPS 10 mg (has no administration in time range)  Calcium Carb-Cholecalciferol 600-800 MG-UNIT TABS 1 tablet (has no administration in time range)  calcium-vitamin D (OSCAL WITH D) 500-200 MG-UNIT per tablet 1 tablet (has no administration in time range)  multivitamin-iron-minerals-folic acid (CENTRUM) chewable tablet 1 tablet (has no administration in time range)  albuterol (VENTOLIN HFA) 108 (90 Base) MCG/ACT inhaler 2 puff (has no administration in time range)  heparin injection 5,000 Units (has no administration in time range)  0.9 %  sodium chloride infusion ( Intravenous New Bag/Given 08/25/19 1941)  azithromycin (ZITHROMAX) 500 mg in sodium chloride 0.9 % 250 mL IVPB (has  no administration in time range)  hydrALAZINE (APRESOLINE) tablet 25 mg (has no administration in time range)  cefTRIAXone (ROCEPHIN) 1 g in sodium chloride 0.9 % 100 mL IVPB (has no administration in time range)  sodium chloride flush (NS) 0.9 % injection 3 mL (3 mLs Intravenous Given 08/25/19 1608)  sodium chloride 0.9 % bolus 1,000 mL (0 mLs Intravenous Stopped 08/25/19 1906)  cefTRIAXone (ROCEPHIN) 1 g in sodium chloride 0.9 % 100 mL IVPB (0 g Intravenous  Stopped 08/25/19 1912)     The patient appears reasonably screen and/or stabilized for discharge and I doubt any other medical condition or other [facility] requiring further screening, evaluation, or treatment in the ED at this time prior to discharge.   Final Clinical Impression(s) / ED Diagnoses Final diagnoses:  Community acquired pneumonia of right upper lobe of lung    Rx / DC Orders ED Discharge Orders    None       Deno Etienne, DO 08/25/19 [place], [place], DO 08/25/19 1954

## 2019-08-25 NOTE — ED Notes (Signed)
Pt transported to CT ?

## 2019-08-25 NOTE — ED Triage Notes (Signed)
Per family pt was released from hospital yesterday and today family went to check on her  AND PT WAS FOUND ON FLOOR  ,  Unknown when she was last seen normal, afraid to use left hand

## 2019-08-26 ENCOUNTER — Encounter (HOSPITAL_COMMUNITY): Payer: Self-pay | Admitting: *Deleted

## 2019-08-26 ENCOUNTER — Inpatient Hospital Stay (HOSPITAL_COMMUNITY): Payer: Medicare Other

## 2019-08-26 DIAGNOSIS — R0603 Acute respiratory distress: Secondary | ICD-10-CM

## 2019-08-26 DIAGNOSIS — R55 Syncope and collapse: Secondary | ICD-10-CM

## 2019-08-26 DIAGNOSIS — R42 Dizziness and giddiness: Secondary | ICD-10-CM

## 2019-08-26 LAB — STREP PNEUMONIAE URINARY ANTIGEN: Strep Pneumo Urinary Antigen: NEGATIVE

## 2019-08-26 LAB — PROCALCITONIN: Procalcitonin: <0.1 ng/mL

## 2019-08-26 LAB — BRAIN NATRIURETIC PEPTIDE: B Natriuretic Peptide: 483 pg/mL — ABNORMAL HIGH (ref 0.0–100.0)

## 2019-08-26 MED ORDER — POLYETHYLENE GLYCOL 3350 17 G PO PACK: 17.0000 g | PACK | Freq: Every day | ORAL | Status: DC | PRN

## 2019-08-26 MED ORDER — IPRATROPIUM-ALBUTEROL 0.5-2.5 (3) MG/3ML IN SOLN
3.0000 mL | Freq: Three times a day (TID) | RESPIRATORY_TRACT | Status: DC
  Administered 2019-08-26 – 2019-08-28 (×7): 3 mL via RESPIRATORY_TRACT
  Filled 2019-08-26 (×7): qty 3

## 2019-08-26 MED ORDER — FUROSEMIDE 10 MG/ML IJ SOLN
40.0000 mg | Freq: Once | INTRAMUSCULAR | Status: DC
  Filled 2019-08-26: qty 4

## 2019-08-26 MED ORDER — SENNOSIDES-DOCUSATE SODIUM 8.6-50 MG PO TABS: 2.0000 | ORAL_TABLET | Freq: Every evening | ORAL | Status: DC | PRN

## 2019-08-26 NOTE — Evaluation (Signed)
Clinical/Bedside Swallow Evaluation Patient Details  Name: Crystal Cooper MRN: [id number] Date of Birth: Nov 12, 1926  Today's Date: 08/26/2019 Time: SLP Start Time (ACUTE ONLY): 41 SLP Stop Time (ACUTE ONLY): 1021 SLP Time Calculation (min) (ACUTE ONLY): 15 min  Past Medical History:  Past Medical History:  Diagnosis Date  . Anemia    years ago  . Aortic stenosis   . Arthritis   . Asthma   . Atrial fibrillation ([place])   . CHF (congestive heart failure) ([place]) 11/2014  . Family history of adverse reaction to anesthesia    2 daughters would have N/V  . Glaucoma   . Hearing loss   . Heart murmur   . HTN (hypertension)   . Pneumonia   . Prolapsing mitral leaflet syndrome   . Shortness of breath dyspnea    with exertion  . SVT (supraventricular tachycardia) ([facility])    S/P ablation of AVNRT in 2003   Past Surgical History:  Past Surgical History:  Procedure Laterality Date  . APPENDECTOMY    . BIOPSY  04/07/2018   Procedure: BIOPSY;  Surgeon: Jerene Bears, MD;  Location: [facility] ENDOSCOPY;  Service: Gastroenterology;;  . BLADDER SURGERY    . CARDIAC CATHETERIZATION N/A 09/26/2015   Procedure: Right/Left Heart Cath and Coronary Angiography;  Surgeon: Burnell Blanks, MD;  Location: [facility] CV LAB;  Service: Cardiovascular;  Laterality: N/A;  . CARDIAC SURGERY    . CARDIOVERSION N/A 03/02/2016   Procedure: CARDIOVERSION;  Surgeon: Thayer Headings, MD;  Location: [facility];  Service: Cardiovascular;  Laterality: N/A;  . EP IMPLANTABLE DEVICE N/A 10/26/2015   Procedure: Pacemaker Implant;  Surgeon: Will Meredith Leeds, MD;  Location: [facility] CV LAB;  Service: Cardiovascular;  Laterality: N/A;  . ESOPHAGOGASTRODUODENOSCOPY (EGD) WITH PROPOFOL N/A 04/07/2018   Procedure: ESOPHAGOGASTRODUODENOSCOPY (EGD) WITH PROPOFOL;  Surgeon: Jerene Bears, MD;  Location: [facility] ENDOSCOPY;  Service: Gastroenterology;  Laterality: N/A;  . EYE SURGERY Bilateral    cataract surgery  . HOT HEMOSTASIS  N/A 04/07/2018   Procedure: HOT HEMOSTASIS (ARGON PLASMA COAGULATION/BICAP);  Surgeon: Jerene Bears, MD;  Location: [facility] ENDOSCOPY;  Service: Gastroenterology;  Laterality: N/A;  . NASAL SINUS SURGERY    . TEE WITHOUT CARDIOVERSION N/A 10/25/2015   Procedure: TRANSESOPHAGEAL ECHOCARDIOGRAM (TEE);  Surgeon: Burnell Blanks, MD;  Location: [facility];  Service: Open Heart Surgery;  Laterality: N/A;  . TRANSCATHETER AORTIC VALVE REPLACEMENT, TRANSFEMORAL Right 10/25/2015   Procedure: TRANSCATHETER AORTIC VALVE REPLACEMENT, TRANSFEMORAL;  Surgeon: Burnell Blanks, MD;  Location: [facility];  Service: Open Heart Surgery;  Laterality: Right;   HPI:  Crystal Cooper is a 86 y.o. female with medical history significant of pneumonia, questionable COPD vs chronic bronchitis seen on CXR 2019, hypertension, asthma, paroxysmal atrial fibrillation, aortic stenosis status post surgery, hearing loss, diastolic dysfunction CHF who presented with AMS and found on her floor. Per chart just hospitalized and discharged yesterday after a episode of angioedema secondary to ACE inhibitorof breath. CT No acute abnormalities. 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 MRI pending. CXR showed bilateral upper lobes infiltrates suspect for pneumonia.   Assessment / Plan / Recommendation Clinical Impression  Pleasant, interactive 86 year old participated in swallow assessment with self feeding and denies prior dysphagia. Oral-motor ROM and strength is functional. Straw and thin sips tolerated  without s/s aspiration. Was unable to sustain consecutive 3 oz intake. No difficulty with solid oral or pharyngeal consumption. Pt reported she typically breaks larger  pills in 1/2 and takes smaller ones in applesauce and the larger ones with water. Observed her consume 3 pills without concern. Eructation present and pt educated to remain up after meals. Recommend regular texture, thin, pills in applesauce if needed and  slow pace. No further ST needed.   SLP Visit Diagnosis: Dysphagia, unspecified (R13.10)    Aspiration Risk  Mild aspiration risk    Diet Recommendation Regular;Thin liquid   Liquid Administration via: Cup;Straw Medication Administration: Other (Comment) Supervision: (cut large ones in half, small with water) Compensations: Slow rate;Small sips/bites Postural Changes: Remain upright for at least 30 minutes after po intake;Seated upright at 90 degrees    Other  Recommendations Oral Care Recommendations: Oral care BID   Follow up Recommendations None      Frequency and Duration            Prognosis        Swallow Study   General HPI: Crystal Cooper is a 86 y.o. female with medical history significant of pneumonia, questionable COPD vs chronic bronchitis seen on CXR 2019, hypertension, asthma, paroxysmal atrial fibrillation, aortic stenosis status post surgery, hearing loss, diastolic dysfunction CHF who presented with AMS and found on her floor. Per chart just hospitalized and discharged yesterday after a episode of angioedema secondary to ACE inhibitorof breath. CT No acute abnormalities. 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 MRI pending. CXR showed bilateral upper lobes infiltrates suspect for pneumonia. Type of Study: Bedside Swallow Evaluation Previous Swallow Assessment: (none) Diet Prior to this Study: NPO Temperature Spikes Noted: No Respiratory Status: Room air History of Recent Intubation: No Behavior/Cognition: Alert;Cooperative;Pleasant mood Oral Cavity Assessment: Within Functional Limits Oral Care Completed by SLP: No Oral Cavity - Dentition: Dentures, top;Dentures, bottom Vision: Functional for self-feeding Self-Feeding Abilities: Able to feed self Patient Positioning: Upright in bed Baseline Vocal Quality: Normal Volitional Cough: Strong Volitional Swallow: Able to elicit    Oral/Motor/Sensory Function Overall Oral Motor/Sensory  Function: Within functional limits   Ice Chips Ice chips: Not tested   Thin Liquid Thin Liquid: Within functional limits Presentation: Cup;Straw    Nectar Thick Nectar Thick Liquid: Not tested   Honey Thick Honey Thick Liquid: Not tested   Puree Puree: Within functional limits   Solid     Solid: Within functional limits      Houston Siren 08/26/2019,10:38 AM   Orbie Pyo Colvin Caroli.Ed Risk analyst [phone] Office [phone]

## 2019-08-26 NOTE — TOC Initial Note (Signed)
Transition of Care [facility]) - Initial/Assessment Note    Patient Details  Name: Crystal Cooper MRN: [id number] Date of Birth: 17-Jan-1927  Transition of Care [facility]) CM/SW Contact:    Carles Collet, RN Phone Number: 08/26/2019, 12:32 PM  Clinical Narrative:          Damaris Schooner w patient and daughter at bedside to discuss dispo. Patient lives at home alone with two daughters within 25 or so miles from her. Patient has RW at home, and her house is a one level.  Patient and daughter agree that they want her to return home at [place]. They are interested in [facility] services. We discussed options available for her insurance. They are most interested in [place] through [place]. Referral placed.            Expected Discharge Plan: [place] Barriers to Discharge: Continued Medical Work up   Patient Goals and CMS Choice Patient states their goals for this hospitalization and ongoing recovery are:: to go home CMS Medicare.gov Compare Post Acute Care list provided to:: Patient Choice offered to / list presented to : Patient, Adult Children  Expected Discharge Plan and Services Expected Discharge Plan: [place]   Discharge Planning Services: CM Consult Post Acute Care Choice: Home Health                             HH Arranged: RN, PT, OT, Nurse's Aide [facility] Agency: [place] Date [place]: 08/26/19 Time [place]: 1232 Representative spoke with at [place]: Tommi Rumps (requested home first program)  Prior Living Arrangements/Services   Lives with:: Self                   Activities of Daily Living      Permission Sought/Granted                  Emotional Assessment              Admission diagnosis:  PNA (pneumonia) [J18.9] Community acquired pneumonia of right upper lobe of lung [J18.9] Patient Active Problem List   Diagnosis Date Noted  . Community acquired pneumonia 08/25/2019  . PNA (pneumonia)  08/25/2019  . Hypokalemia 08/23/2019  . Angio-edema, initial encounter 08/23/2019  . Reactive airway disease 03/24/2019  . GERD without esophagitis 03/24/2019  . CKD (chronic kidney disease), stage III 11/12/2018  . GI bleed 05/12/2018  . 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 Diastolic dysfunction 82/42/3536  . Pacemaker 05/12/2018  . Iron deficiency anemia   . Melena   . Angiodysplasia of duodenum   . Gastritis and gastroduodenitis   . Anemia 04/06/2018  . Osteoporosis 08/27/2016  . ASCVD (arteriosclerotic cardiovascular disease) 01/11/2016  . Asthma 01/11/2016  . Glaucoma 01/11/2016  . Hearing loss 01/11/2016  . Other nonrheumatic mitral valve disorders 01/11/2016  . Junctional bradycardia   . PAF (paroxysmal atrial fibrillation) ([place]) 09/06/2015  . Hyponatremia 12/17/2014  . Murmur 10/19/2011  . Chest pain 10/19/2011  . Essential hypertension 10/19/2011  . History of cardiac radiofrequency ablation (RFA) 10/19/2011   PCP:  Baruch Gouty, FNP Pharmacy:   [facility] [address] [place]  [address] [place] [address] Phone: [phone] Fax: [phone]     Social Determinants of Health (SDOH) Interventions    Readmission Risk Interventions No flowsheet data found.

## 2019-08-26 NOTE — Progress Notes (Signed)
Placed pacemaker in MRI safe mode per orders VOO 70.  Will continue to monitor pt until pt taken out of safe mode and return program complete

## 2019-08-26 NOTE — Progress Notes (Signed)
PROGRESS NOTE    Crystal Cooper  LOV:[id number] DOB: September 12, 1926 DOA: 08/25/2019 PCP: Baruch Gouty, FNP   Brief Narrative:  86 year old with history of HTN, asthma, paroxysmal A. fib, aortic stenosis status post surgery, hearing loss, CHF presented with altered mental status.  Recently admitted to the hospital for angioedema secondary to ACE inhibitor which was discontinued.  Reports of copious amount of runny nose for the past 3 weeks.  Upon admission chest x-ray showed bilateral upper lobe infiltrates suspect pneumonia and neurology team was consulted.  Assessment & Plan:   Active Problems:   Community acquired pneumonia   PNA (pneumonia)  Bilateral upper lobe infiltrate concerning for pneumonia Aspiration/persistent postnasal drip -COVID-19 negative.  Procalcitonin negative.  Will stop antibiotics -Check BNP.  Stop IV fluids, Lasix 40 mg IV once. -Bronchodilators, incentive spirometer and flutter valve -Speech and swallow -mild aspiration risk, regular thin liquid okay. -Strep pneumo antigen-pending, Legionella-in process  Acute metabolic encephalopathy -Suspect from underlying infection -MRI brain ordered -UA-negative  Presyncope- she has some autonomic orthostasis.  History of biosynthetic aortic valve status post TAVR  History of paroxysmal atrial fibrillation Sick sinus syndrome status post pacemaker -Eliquis stopped due to recurrent GI bleeds -Metoprolol 25 mg twice daily  Essential hypertension -Metoprolol twice daily  DVT prophylaxis: Subcutaneous heparin Code Status: Full code Family Communication:  Spoke with [name].  Disposition Plan:   Patient From= home  Patient Anticipated D/C place= to be determined once seen by PT  Barriers= maintain hospital stay until her breathing status is improved, in the meantime she requires IV Lasix.  Awaiting her mentation returned to baseline, MRI brain ordered.  Subjective: Coughing as I walked in the room and had some  exertional dyspnea.  At rest she felt okay.  Does have some confusion regarding today's date.  Review of Systems Otherwise negative except as per HPI, including: General: Denies fever, chills, night sweats or unintended weight loss. Resp: Denies hemoptysis Cardiac: Denies chest pain, palpitations, orthopnea, paroxysmal nocturnal dyspnea. GI: Denies abdominal pain, nausea, vomiting, diarrhea or constipation GU: Denies dysuria, frequency, hesitancy or incontinence MS: Denies muscle aches, joint pain or swelling Neuro: Denies headache, neurologic deficits (focal weakness, numbness, tingling), abnormal gait Psych: Denies anxiety, depression, SI/HI/AVH Skin: Denies new rashes or lesions ID: Denies sick contacts, exotic exposures, travel  Examination:  General exam: Appears calm and comfortable  Respiratory system: B/L rhonchi Cardiovascular system: S1 & S2 heard, RRR. No JVD, murmurs, rubs, gallops or clicks. No pedal edema. Gastrointestinal system: Abdomen is nondistended, soft and nontender. No organomegaly or masses felt. Normal bowel sounds heard. Central nervous system: Alert and oriented. No focal neurological deficits. Extremities: Symmetric 5 x 5 power. Skin: No rashes, lesions or ulcers Psychiatry: Judgement and insight appear normal. Mood & affect appropriate.   Objective: Vitals:   08/25/19 1845 08/25/19 1900 08/25/19 2152 08/26/19 0742  BP: (!) 152/73 (!) 161/66 139/71 (!) 157/73  Pulse: (!) 59 (!) 58 (!) 58 60  Resp: (!) 24 18 19 16   Temp:   98.3 F (36.8 C) 98 F (36.7 C)  TempSrc:   Oral   SpO2: 98% 100% 95% 96%  Weight:      Height:        Intake/Output Summary (Last 24 hours) at 08/26/2019 0831 Last data filed at 08/25/2019 2048 Gross per 24 hour  Intake 1350 ml  Output 800 ml  Net 550 ml   Filed Weights   08/25/19 1553  Weight: 49.4 kg  Data Reviewed:   CBC: Recent Labs  Lab 08/23/19 1623 08/24/19 0321 08/25/19 1525  WBC 9.6 6.3 9.9  HGB  11.1* 10.9* 12.3  HCT 34.3* 32.6* 37.4  MCV 97.4 95.6 96.4  PLT 202 172 546   Basic Metabolic Panel: Recent Labs  Lab 08/23/19 1623 08/24/19 0321 08/25/19 1525  NA 136 134* 136  K 3.1* 3.2* 3.7  CL 95* 96* 99  CO2 28 29 25   GLUCOSE 111* 158* 155*  BUN 14 15 24*  CREATININE 1.17* 1.07* 1.07*  CALCIUM 8.9 8.8* 9.1   GFR: Estimated Creatinine Clearance: 27.3 mL/min (A) (by C-G formula based on SCr of 1.07 mg/dL (H)). Liver Function Tests: Recent Labs  Lab 08/24/19 0321 08/25/19 1525  AST 26 43*  ALT 20 26  ALKPHOS 46 56  BILITOT 1.6* 1.1  PROT 6.1* 6.7  ALBUMIN 3.2* 3.5   No results for input(s): LIPASE, AMYLASE in the last 168 hours. No results for input(s): AMMONIA in the last 168 hours. Coagulation Profile: No results for input(s): INR, PROTIME in the last 168 hours. Cardiac Enzymes: Recent Labs  Lab 08/25/19 1525  CKTOTAL 263*   BNP (last 3 results) No results for input(s): PROBNP in the last 8760 hours. HbA1C: No results for input(s): HGBA1C in the last 72 hours. CBG: Recent Labs  Lab 08/24/19 1138 08/25/19 1628  GLUCAP 115* 109*   Lipid Profile: No results for input(s): CHOL, HDL, LDLCALC, TRIG, CHOLHDL, LDLDIRECT in the last 72 hours. Thyroid Function Tests: No results for input(s): TSH, T4TOTAL, FREET4, T3FREE, THYROIDAB in the last 72 hours. Anemia Panel: No results for input(s): VITAMINB12, FOLATE, FERRITIN, TIBC, IRON, RETICCTPCT in the last 72 hours. Sepsis Labs: Recent Labs  Lab 08/25/19 1842  PROCALCITON <0.10    Recent Results (from the past 240 hour(s))  SARS CORONAVIRUS 2 (TAT 6-24 HRS) Nasopharyngeal Nasopharyngeal Swab     Status: None   Collection Time: 08/23/19  7:45 PM   Specimen: Nasopharyngeal Swab  Result Value Ref Range Status   SARS Coronavirus 2 NEGATIVE NEGATIVE Final    Comment: (NOTE) SARS-CoV-2 target nucleic acids are NOT DETECTED. The SARS-CoV-2 RNA is generally detectable in upper and lower respiratory  specimens during the acute phase of infection. Negative results do not preclude SARS-CoV-2 infection, do not rule out co-infections with other pathogens, and should not be used as the sole basis for treatment or other patient management decisions. Negative results must be combined with clinical observations, patient history, and epidemiological information. The expected result is Negative. Fact Sheet for Patients: SugarRoll.be Fact Sheet for Healthcare Providers: https://www.woods-mathews.com/ This test is not yet approved or cleared by the [place] FDA and  has been authorized for detection and/or diagnosis of SARS-CoV-2 by FDA under an Emergency Use Authorization (EUA). This EUA will remain  in effect (meaning this test can be used) for the duration of the COVID-19 declaration under Section 56 4(b)(1) of the Act, 21 U.S.C. section 360bbb-3(b)(1), unless the authorization is terminated or revoked sooner. Performed at [facility], [place] [address] [place], [place] [address]   SARS CORONAVIRUS 2 (TAT 6-24 HRS) Nasopharyngeal Nasopharyngeal Swab     Status: None   Collection Time: 08/25/19  6:04 PM   Specimen: Nasopharyngeal Swab  Result Value Ref Range Status   SARS Coronavirus 2 NEGATIVE NEGATIVE Final    Comment: (NOTE) SARS-CoV-2 target nucleic acids are NOT DETECTED. The SARS-CoV-2 RNA is generally detectable in upper and lower respiratory specimens during the acute  phase of infection. Negative results do not preclude SARS-CoV-2 infection, do not rule out co-infections with other pathogens, and should not be used as the sole basis for treatment or other patient management decisions. Negative results must be combined with clinical observations, patient history, and epidemiological information. The expected result is Negative. Fact Sheet for Patients: SugarRoll.be Fact Sheet for Healthcare  Providers: https://www.woods-mathews.com/ This test is not yet approved or cleared by the [place] FDA and  has been authorized for detection and/or diagnosis of SARS-CoV-2 by FDA under an Emergency Use Authorization (EUA). This EUA will remain  in effect (meaning this test can be used) for the duration of the COVID-19 declaration under Section 56 4(b)(1) of the Act, 21 U.S.C. section 360bbb-3(b)(1), unless the authorization is terminated or revoked sooner. Performed at [facility], [place] [address] [place],  [address]          Radiology Studies: CT Head Wo Contrast  Result Date: 08/25/2019 CLINICAL DATA:  Altered mental status. Encephalopathy. Patient was found down. EXAM: CT HEAD WITHOUT CONTRAST TECHNIQUE: Contiguous axial images were obtained from the base of the skull through the vertex without intravenous contrast. COMPARISON:  None. FINDINGS: Brain: No evidence of acute infarction, hemorrhage, hydrocephalus, extra-axial collection or mass lesion/mass effect. There multiple old bilateral lacunar infarcts, more prominent on the right than the left. Periventricular white matter lucency consistent with small vessel ischemic disease. Diffuse mild cerebral cortical atrophy with secondary ventricular dilatation. Vascular: No hyperdense vessel or unexpected calcification. Skull: Normal. Negative for fracture or focal lesion. Sinuses/Orbits: Normal. Other: None IMPRESSION: No acute abnormalities. 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 Electronically Signed   By: Lorriane Shire M.D.   On: 08/25/2019 17:55   DG Chest Port 1 View  Result Date: 08/25/2019 CLINICAL DATA:  Altered mental status.  Found on floor EXAM: PORTABLE CHEST 1 VIEW COMPARISON:  08/23/2019 FINDINGS: TAVR unchanged in position.  Dual lead pacemaker unchanged. Negative for heart failure.  No pleural effusion. Streaky markings in the lung apices have developed since the prior  study and could be due to pneumonia. Follow-up recommended. IMPRESSION: Possible upper lobe infiltrates bilaterally. Electronically Signed   By: Franchot Gallo M.D.   On: 08/25/2019 16:20        Scheduled Meds: . calcium-vitamin D  1 tablet Oral Daily  . ferrous sulfate  325 mg Oral BID WC  . furosemide  60 mg Oral Daily  . heparin  5,000 Units Subcutaneous Q12H  . metoprolol tartrate  25 mg Oral BID  . multivitamin with minerals  1 tablet Oral Daily  . pantoprazole  40 mg Oral Q0600   Continuous Infusions: . sodium chloride 75 mL/hr (08/26/19 0639)  . azithromycin    . cefTRIAXone (ROCEPHIN)  IV       LOS: 1 day   Time spent= 35 mins    Quanta Roher Arsenio Loader, MD [facility]  If 7PM-7AM, please contact night-coverage  08/26/2019, 8:31 AM

## 2019-08-26 NOTE — Evaluation (Signed)
Physical Therapy Evaluation Patient Details Name: Crystal Cooper MRN: [id number] DOB: [birth year] Today's Date: 08/26/2019   History of Present Illness  86 y.o. female admitted to ED 3/2 after being found on floor by family at home, recent admission 2/28-3/1 for angioedema. Current medical diagnoses of bacterial PNA, AMS.  PMH includes hypertension, reactive airway disease, paroxysmal atrial fibrillation, aortic stenosis s/p TAVR, pacemaker, osteoporosis, hearing loss, diastolic dysfunction CHF.  Clinical Impression   Pt presents with generalized weakness, unsteadiness in standing, impaired cognition (from chart review worse than baseline), decreased activity tolerance, and poor safety awareness. Pt to benefit from acute PT to address deficits. Pt ambulated ~40 ft with RW and min assist from PT for safety and stability. Pt with + orthostatics, but pt reports no dizziness or lightheadedness during mobility. PT recommending HHPT with 24/7 from daughters, if 24/7 not possible PT recommends SNF. PT to progress mobility as tolerated, and will continue to follow acutely.   Orthostatics (BP, HR in bpm): - supine: 152/60, 68 - sitting: 137/84, 77 - standing 0 min: 123/61, 81 - standing 3 min: 142/68, 65  SpO2 95% post-ambulation       Follow Up Recommendations Home health PT;Supervision/Assistance - 24 hour    Equipment Recommendations  None recommended by PT    Recommendations for Other Services       Precautions / Restrictions Precautions Precautions: Fall Precaution Comments: + orthostatics (see flowsheet) Restrictions Weight Bearing Restrictions: No      Mobility  Bed Mobility Overal bed mobility: Needs Assistance Bed Mobility: Supine to Sit     Supine to sit: Min assist;HOB elevated     General bed mobility comments: light assist for scooting to EOB with use of bed pads, pt able to come to sitting with increased time and effort.  Transfers Overall transfer level: Needs  assistance Equipment used: Rolling walker (2 wheeled) Transfers: Sit to/from Stand Sit to Stand: Min assist;From elevated surface         General transfer comment: Min assist for initial power up, steadying. Verbal cuign for hand placement not followed.  Ambulation/Gait Ambulation/Gait assistance: Min assist Gait Distance (Feet): 40 Feet Assistive device: Rolling walker (2 wheeled) Gait Pattern/deviations: Step-through pattern;Decreased stride length;Trunk flexed Gait velocity: decr   General Gait Details: Min assist for steadying, guiding pt and RW during mobility. Verbal cuing for safe placement inside RW.  Stairs            Wheelchair Mobility    Modified Rankin (Stroke Patients Only)       Balance Overall balance assessment: Needs assistance;History of Falls Sitting-balance support: Single extremity supported;Feet unsupported Sitting balance-Leahy Scale: Fair Sitting balance - Comments: posterior leaning with feet unsupported Postural control: Posterior lean Standing balance support: Bilateral upper extremity supported;During functional activity Standing balance-Leahy Scale: Poor Standing balance comment: reliant on external support                             Pertinent Vitals/Pain Pain Assessment: No/denies pain    Home Living Family/patient expects to be discharged to:: Private residence Living Arrangements: Alone Available Help at Discharge: Family;Available PRN/intermittently(daughters live closeby, stay with her "if I am not feeling myself") Type of Home: House Home Access: Stairs to enter Entrance Stairs-Rails: Left Entrance Stairs-Number of Steps: 3 Home Layout: One level Home Equipment: Cane - single point;Walker - 2 wheels      Prior Function Level of Independence: Needs assistance   Gait /  Transfers Assistance Needed: pt ambulates household distances with cane  ADL's / Homemaking Assistance Needed: pt reports dressing and  bathing self, requires assist for cleaning and cooking. Pt reports her family brings her meals, but she can prepare basic food.        Hand Dominance   Dominant Hand: Right    Extremity/Trunk Assessment   Upper Extremity Assessment Upper Extremity Assessment: Generalized weakness    Lower Extremity Assessment Lower Extremity Assessment: Generalized weakness    Cervical / Trunk Assessment Cervical / Trunk Assessment: Kyphotic  Communication   Communication: HOH  Cognition Arousal/Alertness: Awake/alert Behavior During Therapy: WFL for tasks assessed/performed Overall Cognitive Status: No family/caregiver present to determine baseline cognitive functioning Area of Impairment: Orientation;Attention;Memory;Following commands;Safety/judgement;Problem solving                 Orientation Level: Disoriented to;Time;Situation Current Attention Level: Selective Memory: Decreased short-term memory Following Commands: Follows one step commands consistently;Follows one step commands with increased time Safety/Judgement: Decreased awareness of deficits;Decreased awareness of safety   Problem Solving: Slow processing;Decreased initiation;Difficulty sequencing;Requires verbal cues;Requires tactile cues General Comments: Pt A&O to self, but unaware she is in the hospital and states "I thought I left already, and we were running one of our errands". Pt also did not recall being found by family on ground at home PTA. Pt requires multimodal cuing throughout session to perform mobility safely and effectively, increased time to start and change tasks. PT suspects hearing deficit plays into this as well. Pt is very pleasant.      General Comments      Exercises General Exercises - Lower Extremity Hip ABduction/ADduction: AROM;Both;5 reps;Supine   Assessment/Plan    PT Assessment Patient needs continued PT services  PT Problem List Decreased strength;Decreased mobility;Decreased safety  awareness;Decreased cognition;Decreased activity tolerance;Decreased balance;Decreased knowledge of use of DME;Cardiopulmonary status limiting activity       PT Treatment Interventions DME instruction;Therapeutic activities;Gait training;Therapeutic exercise;Patient/family education;Balance training;Stair training;Functional mobility training;Neuromuscular re-education    PT Goals (Current goals can be found in the Care Plan section)  Acute Rehab PT Goals Patient Stated Goal: go home with help of my daughters PT Goal Formulation: With patient Time For Goal Achievement: 09/09/19 Potential to Achieve Goals: Good    Frequency Min 3X/week   Barriers to discharge        Co-evaluation               AM-PAC PT "6 Clicks" Mobility  Outcome Measure Help needed turning from your back to your side while in a flat bed without using bedrails?: A Little Help needed moving from lying on your back to sitting on the side of a flat bed without using bedrails?: A Little Help needed moving to and from a bed to a chair (including a wheelchair)?: A Little Help needed standing up from a chair using your arms (e.g., wheelchair or bedside chair)?: A Little Help needed to walk in hospital room?: A Little Help needed climbing 3-5 steps with a railing? : A Lot 6 Click Score: 17    End of Session Equipment Utilized During Treatment: Gait belt Activity Tolerance: Patient tolerated treatment well;Patient limited by fatigue Patient left: in bed;with call bell/phone within reach;with bed alarm set;with nursing/sitter in room(sitting EOB with bed alarm on, NT in room setting up chair and preparing to bathe pt) Nurse Communication: Mobility status PT Visit Diagnosis: Muscle weakness (generalized) (M62.81);Unsteadiness on feet (R26.81)    Time: 3154-0086 PT Time Calculation (min) (ACUTE ONLY): 29  min   Charges:   PT Evaluation $PT Eval Low Complexity: 1 Low          Shereese Bonnie E, PT Acute  Rehabilitation Services Pager [phone]  Office [phone]   Saleema Weppler D Elonda Husky 08/26/2019, 11:32 AM

## 2019-08-26 NOTE — Progress Notes (Signed)
  Programed device  back to pre-MRI settings after completion of exam.

## 2019-08-27 ENCOUNTER — Inpatient Hospital Stay (HOSPITAL_COMMUNITY): Payer: Medicare Other

## 2019-08-27 DIAGNOSIS — I639 Cerebral infarction, unspecified: Secondary | ICD-10-CM

## 2019-08-27 DIAGNOSIS — I350 Nonrheumatic aortic (valve) stenosis: Secondary | ICD-10-CM

## 2019-08-27 DIAGNOSIS — I5032 Chronic diastolic (congestive) heart failure: Secondary | ICD-10-CM

## 2019-08-27 DIAGNOSIS — I482 Chronic atrial fibrillation, unspecified: Secondary | ICD-10-CM

## 2019-08-27 HISTORY — DX: Cerebral infarction, unspecified: I63.9

## 2019-08-27 LAB — LEGIONELLA PNEUMOPHILA SEROGP 1 UR AG: L. pneumophila Serogp 1 Ur Ag: NEGATIVE

## 2019-08-27 LAB — LIPID PANEL
Cholesterol: 159 mg/dL (ref 0–200)
HDL: 64 mg/dL (ref >40)
LDL Cholesterol: 83 mg/dL (ref 0–99)
Total CHOL/HDL Ratio: 2.5 RATIO
Triglycerides: 60 mg/dL (ref <150)
VLDL: 12 mg/dL (ref 0–40)

## 2019-08-27 LAB — BASIC METABOLIC PANEL
Anion gap: 13 (ref 5–15)
BUN: 24 mg/dL — ABNORMAL HIGH (ref 8–23)
CO2: 25 mmol/L (ref 22–32)
Calcium: 8.4 mg/dL — ABNORMAL LOW (ref 8.9–10.3)
Chloride: 99 mmol/L (ref 98–111)
Creatinine, Ser: 1.46 mg/dL — ABNORMAL HIGH (ref 0.44–1.00)
GFR calc Af Amer: 36 mL/min — ABNORMAL LOW (ref >60)
GFR calc non Af Amer: 31 mL/min — ABNORMAL LOW (ref >60)
Glucose, Bld: 143 mg/dL — ABNORMAL HIGH (ref 70–99)
Potassium: 3.1 mmol/L — ABNORMAL LOW (ref 3.5–5.1)
Sodium: 137 mmol/L (ref 135–145)

## 2019-08-27 LAB — FERRITIN: Ferritin: 55 ng/mL (ref 11–307)

## 2019-08-27 LAB — CBC
HCT: 34.5 % — ABNORMAL LOW (ref 36.0–46.0)
Hemoglobin: 11.5 g/dL — ABNORMAL LOW (ref 12.0–15.0)
MCH: 32.1 pg (ref 26.0–34.0)
MCHC: 33.3 g/dL (ref 30.0–36.0)
MCV: 96.4 fL (ref 80.0–100.0)
Platelets: 190 10*3/uL (ref 150–400)
RBC: 3.58 MIL/uL — ABNORMAL LOW (ref 3.87–5.11)
RDW: 13.2 % (ref 11.5–15.5)
WBC: 7.3 10*3/uL (ref 4.0–10.5)
nRBC: 0 % (ref 0.0–0.2)

## 2019-08-27 LAB — OCCULT BLOOD X 1 CARD TO LAB, STOOL: Fecal Occult Bld: POSITIVE — AB

## 2019-08-27 LAB — VITAMIN B12: Vitamin B-12: 1497 pg/mL — ABNORMAL HIGH (ref 180–914)

## 2019-08-27 LAB — IRON AND TIBC
Iron: 57 ug/dL (ref 28–170)
Saturation Ratios: 22 % (ref 10.4–31.8)
TIBC: 263 ug/dL (ref 250–450)
UIBC: 206 ug/dL

## 2019-08-27 LAB — PROCALCITONIN: Procalcitonin: <0.1 ng/mL

## 2019-08-27 LAB — HEMOGLOBIN A1C
Hgb A1c MFr Bld: 5.3 % (ref 4.8–5.6)
Mean Plasma Glucose: 105.41 mg/dL

## 2019-08-27 LAB — MAGNESIUM: Magnesium: 1.8 mg/dL (ref 1.7–2.4)

## 2019-08-27 MED ORDER — ASPIRIN 325 MG PO TABS
325.0000 mg | ORAL_TABLET | Freq: Once | ORAL | Status: AC
  Administered 2019-08-27: 325 mg via ORAL
  Filled 2019-08-27: qty 1

## 2019-08-27 MED ORDER — POTASSIUM CHLORIDE CRYS ER 20 MEQ PO TBCR
40.0000 meq | EXTENDED_RELEASE_TABLET | Freq: Two times a day (BID) | ORAL | Status: AC
  Administered 2019-08-27 (×2): 40 meq via ORAL
  Filled 2019-08-27 (×2): qty 2

## 2019-08-27 NOTE — Progress Notes (Signed)
Occupational Therapy Evaluation Patient Details Name: Crystal Cooper MRN: [id number] DOB: 07/25/1926 Today's Date: 08/27/2019    History of Present Illness 86 y.o. female admitted to ED 3/2 after being found on floor by family at home, recent admission 2/28-3/1 for angioedema. Current medical diagnoses of bacterial PNA, AMS.  PMH includes hypertension, reactive airway disease, paroxysmal atrial fibrillation, aortic stenosis s/p TAVR, pacemaker, osteoporosis, hearing loss, diastolic dysfunction CHF.   Clinical Impression   Patient is very kind and HOH.  She lives alone in a single story home, but daughters are near and check on her frequently.  They assist her with IADLs like cooking and cleaning, patient able to bath herself independently with washcloths.  Mod independent with most ADLs and typically uses a cane.  Today patient was cognitively foggy and not oriented to place/situation.  She demonstrated slow processing and and poor initiation, needing many cues to start task.  Patient was min assist with mobility.  She moves slowly but was stable with RW.  Able to toilet with min guard and stand at sink for 10 min with grooming.  Will continue to follow with OT acutely to address the deficits listed below.      Follow Up Recommendations  Home health OT;Supervision - Intermittent    Equipment Recommendations  None recommended by OT    Recommendations for Other Services       Precautions / Restrictions Precautions Precautions: Fall Restrictions Weight Bearing Restrictions: No      Mobility Bed Mobility Overal bed mobility: Needs Assistance Bed Mobility: Supine to Sit     Supine to sit: Min assist;HOB elevated        Transfers Overall transfer level: Needs assistance Equipment used: Rolling walker (2 wheeled) Transfers: Sit to/from Stand Sit to Stand: Min assist;From elevated surface              Balance Overall balance assessment: Needs assistance;History of  Falls Sitting-balance support: Single extremity supported;Feet unsupported Sitting balance-Leahy Scale: Fair     Standing balance support: Bilateral upper extremity supported;During functional activity Standing balance-Leahy Scale: Poor                             ADL either performed or assessed with clinical judgement   ADL Overall ADL's : Needs assistance/impaired Eating/Feeding: Set up;Sitting   Grooming: Oral care;Wash/dry hands;Min guard;Standing   Upper Body Bathing: Minimal assistance;Standing   Lower Body Bathing: Minimal assistance;Sitting/lateral leans   Upper Body Dressing : Sitting;Minimal assistance   Lower Body Dressing: Minimal assistance;Sitting/lateral leans   Toilet Transfer: Minimal assistance;Ambulation;Regular Toilet;RW   Toileting- Water quality scientist and Hygiene: Min guard;Sit to/from stand       Functional mobility during ADLs: Min guard;Rolling walker General ADL Comments: Moves slowly and requires cueing for sequencing and initiation     Vision         Perception     Praxis      Pertinent Vitals/Pain Pain Assessment: No/denies pain     Hand Dominance Right   Extremity/Trunk Assessment Upper Extremity Assessment Upper Extremity Assessment: Generalized weakness           Communication Communication Communication: HOH   Cognition Arousal/Alertness: Awake/alert Behavior During Therapy: WFL for tasks assessed/performed Overall Cognitive Status: No family/caregiver present to determine baseline cognitive functioning Area of Impairment: Orientation;Attention;Memory;Following commands;Safety/judgement;Problem solving                 Orientation Level: Situation;Place Current Attention Level:  Selective Memory: Decreased short-term memory Following Commands: Follows one step commands consistently;Follows one step commands with increased time Safety/Judgement: Decreased awareness of deficits;Decreased awareness  of safety   Problem Solving: Slow processing;Decreased initiation;Difficulty sequencing;Requires verbal cues;Requires tactile cues General Comments: She si a little foggy.  Slow processing and poor ini   General Comments       Exercises     Shoulder Instructions      Home Living Family/patient expects to be discharged to:: Private residence Living Arrangements: Alone Available Help at Discharge: Family;Available PRN/intermittently Type of Home: House Home Access: Stairs to enter [employer] of Steps: 3 Entrance Stairs-Rails: Left Home Layout: One level     Bathroom Shower/Tub: Teacher, early years/pre: Standard     Home Equipment: Cane - single point;Walker - 2 wheels;Shower seat   Additional Comments: Has shower seat but does not use it, she said she only sponge bathes now as getting in the tub is too difficult.  Uses cane primary      Prior Functioning/Environment Level of Independence: Needs assistance  Gait / Transfers Assistance Needed: pt ambulates household distances with cane ADL's / Homemaking Assistance Needed: pt reports dressing and bathing self, requires assist for cleaning and cooking. Pt reports her family brings her meals, but she can prepare basic food.            OT Problem List: Decreased strength;Decreased activity tolerance;Impaired balance (sitting and/or standing);Decreased cognition;Decreased safety awareness      OT Treatment/Interventions: Self-care/ADL training;Therapeutic exercise;Energy conservation;Therapeutic activities;Cognitive remediation/compensation;Patient/family education;Balance training    OT Goals(Current goals can be found in the care plan section) Acute Rehab OT Goals Patient Stated Goal: go home with help of my daughters OT Goal Formulation: With patient Time For Goal Achievement: 09/10/19 Potential to Achieve Goals: Good  OT Frequency: Min 3X/week   Barriers to D/C:            Co-evaluation               AM-PAC OT "6 Clicks" Daily Activity     Outcome Measure Help from another person eating meals?: A Little Help from another person taking care of personal grooming?: A Little Help from another person toileting, which includes using toliet, bedpan, or urinal?: A Little Help from another person bathing (including washing, rinsing, drying)?: A Little Help from another person to put on and taking off regular upper body clothing?: A Little Help from another person to put on and taking off regular lower body clothing?: A Little 6 Click Score: 18   End of Session Equipment Utilized During Treatment: Rolling walker;Gait belt Nurse Communication: Mobility status  Activity Tolerance: Patient tolerated treatment well Patient left: in chair;with call bell/phone within reach;with chair alarm set  OT Visit Diagnosis: Unsteadiness on feet (R26.81);Muscle weakness (generalized) (M62.81);History of falling (Z91.81);Other symptoms and signs involving cognitive function                Time: 3704-8889 OT Time Calculation (min): 38 min Charges:  OT General Charges $OT Visit: 1 Visit OT Evaluation $OT Eval Moderate Complexity: 1 Mod OT Treatments $Self Care/Home Management : 23-37 mins  August Luz, OTR/L   Phylliss Bob 08/27/2019, 10:34 AM

## 2019-08-27 NOTE — Consult Note (Signed)
Neurology Consultation  Reason for Consult: Stroke found on MRI Referring Physician: Gerlean Ren  CC: No chief complaint.  Consult for stroke found on MRI  History is obtained from: Chart  HPI: Crystal Cooper is a 86 y.o. female with past medical history of SVT status post ablation, prolapsed mitral leaflet, hypertension, atrial fibrillation on chronic anticoagulation however this has been stopped secondary to GI bleed.  Patient recently was admitted to the hospital for angioedema secondary to ACE inhibitor.  ACE inhibitor was discontinued.  Patient came to the ED at this point in time secondary to being found on the floor. Code stroke considered patient outside window for tPA and not LVO positive.  MRI brain was ordered,   MRI which did show acute stroke.  For this reason neurology was consulted.  Currently patient has no complaints.  She denies any weakness, numbness, decreased vision, difficulty with speaking.   ED course  Relevant labs include -urinalysis negative, Covid screen negative CT head shows-no acute abnormalities chest x-ray showing bilateral upper lobe infiltrate concerning for pneumonia Chart review-no previous neurological notes in epic  Work up that has been done: MRI brain as below CT head as below 2D echo obtained on 07/06/2019 which showed ejection fraction 60 to 65%.  No PFO noted  LKW: Unknown tpa given?: no, unknown last known normal Premorbid modified Rankin scale (mRS): 0 NIH stroke score: 0   Past Medical History:  Diagnosis Date  . Anemia    years ago  . Aortic stenosis   . Arthritis   . Asthma   . Atrial fibrillation ([place])   . CHF (congestive heart failure) ([place]) 11/2014  . Family history of adverse reaction to anesthesia    2 daughters would have N/V  . Glaucoma   . Hearing loss   . Heart murmur   . HTN (hypertension)   . Pneumonia   . Prolapsing mitral leaflet syndrome   . Shortness of breath dyspnea    with exertion  . SVT  (supraventricular tachycardia) ([facility])    S/P ablation of AVNRT in 2003    Family History  Problem Relation Age of Onset  . Hypertension Mother   . Pneumonia Father   . Heart attack Neg Hx   . Colon cancer Neg Hx   . Esophageal cancer Neg Hx    Social History:   reports that she has never smoked. She has never used smokeless tobacco. She reports that she does not drink alcohol or use drugs.  Medications  Current Facility-Administered Medications:  .  albuterol (PROVENTIL) (2.5 MG/3ML) 0.083% nebulizer solution 3 mL, 3 mL, Inhalation, Q4H PRN, Wynetta Fines T, MD .  calcium-vitamin D (OSCAL WITH D) 500-200 MG-UNIT per tablet 1 tablet, 1 tablet, Oral, Daily, Wynetta Fines T, MD, 1 tablet at 08/27/19 [phone] .  docusate sodium (COLACE) capsule 100 mg, 100 mg, Oral, Daily PRN, Wynetta Fines T, MD .  ferrous sulfate tablet 325 mg, 325 mg, Oral, BID WC, Wynetta Fines T, MD, 325 mg at 08/27/19 6834 .  furosemide (LASIX) injection 40 mg, 40 mg, Intravenous, Once, Amin, Ankit Chirag, MD .  heparin injection 5,000 Units, 5,000 Units, Subcutaneous, Q12H, Wynetta Fines T, MD, 5,000 Units at 08/27/19 1962 .  hydrALAZINE (APRESOLINE) tablet 25 mg, 25 mg, Oral, Q6H PRN, Wynetta Fines T, MD .  ipratropium-albuterol (DUONEB) 0.5-2.5 (3) MG/3ML nebulizer solution 3 mL, 3 mL, Nebulization, TID, Amin, Ankit Chirag, MD, 3 mL at 08/27/19 0726 .  metoprolol tartrate (LOPRESSOR) tablet  25 mg, 25 mg, Oral, BID, Wynetta Fines T, MD, 25 mg at 08/27/19 5277 .  multivitamin with minerals tablet 1 tablet, 1 tablet, Oral, Daily, Lequita Halt, MD, 1 tablet at 08/27/19 [phone] .  pantoprazole (PROTONIX) EC tablet 40 mg, 40 mg, Oral, Q0600, Lequita Halt, MD, 40 mg at 08/27/19 0609 .  polyethylene glycol (MIRALAX / GLYCOLAX) packet 17 g, 17 g, Oral, Daily PRN, Amin, Ankit Chirag, MD .  senna-docusate (Senokot-S) tablet 2 tablet, 2 tablet, Oral, QHS PRN, Amin, Ankit Chirag, MD  ROS:   General ROS: negative for - chills, fatigue, fever,  night sweats, weight gain or weight loss Psychological ROS: negative for - behavioral disorder, hallucinations, memory difficulties, mood swings or suicidal ideation Ophthalmic ROS: negative for - blurry vision, double vision, eye pain or loss of vision ENT ROS: negative for - epistaxis, nasal discharge, oral lesions, sore throat, tinnitus or vertigo Respiratory ROS: negative for - cough, hemoptysis, shortness of breath or wheezing Cardiovascular ROS: negative for - chest pain, dyspnea on exertion, edema or irregular heartbeat Gastrointestinal ROS: negative for - abdominal pain, diarrhea, hematemesis, nausea/vomiting or stool incontinence Genito-Urinary ROS: negative for - dysuria, hematuria, incontinence or urinary frequency/urgency Musculoskeletal ROS: negative for - joint swelling or muscular weakness Neurological ROS: as noted in HPI Dermatological ROS: negative for rash and skin lesion changes  Exam: Current vital signs: BP (!) 105/53 (BP Location: Left Arm)   Pulse 63   Temp 97.9 F (36.6 C) (Oral)   Resp 20   Ht 5\' 2"  (1.575 m)   Wt 49.4 kg   SpO2 97%   BMI 19.94 kg/m  Vital signs in last 24 hours: Temp:  [97.9 F (36.6 C)-99.1 F (37.3 C)] 97.9 F (36.6 C) (03/03 2218) Pulse Rate:  [61-63] 63 (03/03 2218) Resp:  [16-20] 20 (03/03 2218) BP: (105-118)/(53-55) 105/53 (03/03 2218) SpO2:  [97 %] 97 % (03/04 0726) Constitutional: Appears well-developed and well-nourished.  Eyes: No scleral injection HENT: No OP obstrucion Head: Normocephalic.  Cardiovascular: Palpable radial pulses Respiratory: Effort normal, non-labored breathing GI: Soft.  No distension. There is no tenderness.  Skin: WDI Neuro: Mental Status: Patient is awake, alert, oriented to person, place, month, year, and situation.  Slow to respond Speech-intact naming, repeating, comprehension Patient is unable to give a clear and coherent history. Cranial Nerves: II: Visual Fields are full.  III,IV, VI:  EOMI without ptosis or diploplia. Pupils equal, round and reactive to light V: Facial sensation is symmetric to temperature VII: Facial movement is symmetric.  VIII: Presbycusis X: Palat elevates symmetrically XI: Shoulder shrug is symmetric. XII: tongue is midline without atrophy or fasciculations.  Motor: Tone is normal. Bulk is normal. 5/5 strength was present in all four extremities.  No drift No aterixis Sensory: Sensation is symmetric to light touch and temperature in the arms and legs. DSS intact Deep Tendon Reflexes: 2+ and symmetric in the biceps Plantars: Toes are downgoing bilaterally.  Cerebellar: FNF and HKS are intact bilaterally  Labs I have reviewed labs in epic and the results pertinent to this consultation are:  CBC    Component Value Date/Time   WBC 7.3 08/27/2019 0117   RBC 3.58 (L) 08/27/2019 0117   HGB 11.5 (L) 08/27/2019 0117   HGB 12.4 06/24/2019 1200   HCT 34.5 (L) 08/27/2019 0117   HCT 37.9 06/24/2019 1200   PLT 190 08/27/2019 0117   PLT 165 06/24/2019 1200   MCV 96.4 08/27/2019 0117   MCV 93 06/24/2019  1200   MCH 32.1 08/27/2019 0117   MCHC 33.3 08/27/2019 0117   RDW 13.2 08/27/2019 0117   RDW 11.8 06/24/2019 1200   LYMPHSABS 1.0 06/24/2019 1200   MONOABS 0.5 04/23/2018 1601   EOSABS 0.2 06/24/2019 1200   BASOSABS 0.1 06/24/2019 1200    CMP     Component Value Date/Time   NA 137 08/27/2019 0117   NA 136 06/24/2019 1200   K 3.1 (L) 08/27/2019 0117   CL 99 08/27/2019 0117   CO2 25 08/27/2019 0117   GLUCOSE 143 (H) 08/27/2019 0117   BUN 24 (H) 08/27/2019 0117   BUN 20 06/24/2019 1200   CREATININE 1.46 (H) 08/27/2019 0117   CREATININE 0.92 (H) 09/16/2015 1501   CALCIUM 8.4 (L) 08/27/2019 0117   PROT 6.7 08/25/2019 1525   PROT 6.8 06/24/2019 1200   ALBUMIN 3.5 08/25/2019 1525   ALBUMIN 4.1 06/24/2019 1200   AST 43 (H) 08/25/2019 1525   ALT 26 08/25/2019 1525   ALKPHOS 56 08/25/2019 1525   BILITOT 1.1 08/25/2019 1525   BILITOT  0.6 06/24/2019 1200   GFRNONAA 31 (L) 08/27/2019 0117   GFRAA 36 (L) 08/27/2019 0117    Lipid Panel     Component Value Date/Time   CHOL 159 08/27/2019 0117   CHOL 146 11/11/2018 1557   TRIG 60 08/27/2019 0117   HDL 64 08/27/2019 0117   HDL 70 11/11/2018 1557   CHOLHDL 2.5 08/27/2019 0117   VLDL 12 08/27/2019 0117   LDLCALC 83 08/27/2019 0117   LDLCALC 50 11/11/2018 1557     Imaging I have reviewed the images obtained:  CT-scan of the brain-no acute infarct  MRI examination of the brain-13 mm acute/subacute infarct within the right thalamic capsular junction, punctate acute/early subacute cortical infarcts, one within the right insula and one within the right parietal lobe.  An additional punctate questioned within the right occipital lobe acute/subacute early within the right occipital lobe.  Moderate to advanced chronic small vessel ischemic disease.  Multiple chronic infarcts within the basal ganglia, right thalamic area and bilateral cerebellar hemispheres.  Etta Quill PA-C [facility] [phone]  M-F  (9:00 am- 5:00 PM)  08/27/2019, 9:36 AM     Assessment:  Is a 86 year old female with known chronic A. fib on Eliquis which has been stopped recently secondary to GI bleed.  Initially patient was brought in secondary to generalized weakness.  Code stroke was not initially called secondary to no localizing or lateralizing deficits.  MRI was obtained while in hospital which did show stroke as above.  Currently patient does not show any localizing lateralizing abnormalities.  Impression: -Multiple punctate infarcts within the right MCA territory and possibly in the right cerebellum.  Recommend -Carotid Dopplers -Start patient on ASA 325mg  daily if felt appropriate with h/o GI bleed  - Echo performed recently, 65months ago - no need to repeat. -Start or continue Atorvastatin 40 mg/other high intensity statin -BP goal: Normotensive with systolic less than  741 -HBAIC and Lipid profile -Telemetry monitoring -Frequent neuro checks -stroke swallow screen -PT/OT # please page stroke NP  Or  PA  Or MD from 8am -4 pm  as this patient from this time will be  followed by the stroke.   You can look them up on [email or web address]  Password [email or web address]  NEUROHOSPITALIST ADDENDUM Performed a face to face diagnostic evaluation.   I have reviewed the contents of history and physical exam as documented by PA/ARNP/Resident and agree with above documentation.  I have discussed and formulated the above plan as documented. Edits to the note have been made as needed.  Reviewed prior GI notes in August 2020  Underwent EGD at that time, which showed the following:  - Non-obstructing and mild Schatzki ring. - 2 cm hiatal hernia. - Non-bleeding erosive gastropathy in proximal stomach. Biopsied. - Mild bulbar duodenitis. - A single angioectasia in the duodenum. Treated with argon plasma coagulation (APC).  Per Janett Billow is there PA-C had mentioned that if Eliquis needed, okay to resume it.  Recommend touching base with Dr. Stanford Breed to see if he can resume Eliquis.  Acute ischemic stroke likely secondary to cardioembolic from A. fib not on anticoagulation.  On MRI brain-it appears embolic.  Patient is doing well, has minimal visual field deficit- NIHSS 1. No need to repeat echo, obtain carotid Dopplers to rule out carotid stenosis.    Karena Addison Muzammil Bruins MD [facility] [phone]   If 7pm to 7am, please call on call as listed on AMION.

## 2019-08-27 NOTE — Progress Notes (Signed)
Carotid artery duplex has been completed. Preliminary results can be found in CV Proc through chart review.   08/27/19 11:47 AM Crystal Cooper RVT

## 2019-08-27 NOTE — Progress Notes (Signed)
PROGRESS NOTE    Crystal Cooper  ZMO:[id number] DOB: 1927-04-20 DOA: 08/25/2019 PCP: Baruch Gouty, FNP   Brief Narrative:  86 year old with history of HTN, asthma, paroxysmal A. fib, aortic stenosis status post surgery, hearing loss, CHF presented with altered mental status.  Recently admitted to the hospital for angioedema secondary to ACE inhibitor which was discontinued.  Reports of copious amount of runny nose for the past 3 weeks.  Upon admission chest x-ray showed bilateral upper lobe infiltrates suspect pneumonia and neurology team was consulted.  MRI was positive for acute/subacute multifocal CVA therefore neurology team consulted.  Assessment & Plan:   Active Problems:   Community acquired pneumonia   PNA (pneumonia)  Acute multifocal CVA likely secondary to atrial fibrillation, not on anticoagulation -MRI brain-consistent with multifocal CVA.  Stroke team consulted -Initial CT head-negative for acute pathology except chronic infarcts. -LDL 83, A1c 5.3 -We will need to restart Eliquis, will get neurology team input. -Supportive care.  PT/OT  Bilateral upper lobe infiltrate concerning for pneumonia Aspiration/persistent postnasal drip -COVID-19 negative.  Procalcitonin negative.  Will stop antibiotics -BNP mildly elevated.  Hold off on further diuresis -Bronchodilators, incentive spirometer and flutter valve -Speech and swallow -mild aspiration risk, regular thin liquid okay. -Strep pneumo antigen-pending, Legionella-in process  Presyncope- she has some autonomic orthostasis.  History of biosynthetic aortic valve status post TAVR  History of paroxysmal atrial fibrillation Sick sinus syndrome status post pacemaker -Eliquis will need to be restarted -Metoprolol 25 mg twice daily  Essential hypertension -Metoprolol twice daily  DVT prophylaxis: Subcutaneous heparin Code Status: Full code Family Communication: Daughter, [name] updated Disposition Plan:   Patient From=  home  Patient Anticipated D/C place= to be determined  Barriers= maintain hospital stay for acute CVA evaluation, neurology team consulted.  Unsafe for discharge.  Subjective: Her coughing is much better this morning but still has slight off-and-on confusion.  She is afraid that she may intermittently fall due to her weakness.  No other acute events overnight otherwise.  Review of Systems Otherwise negative except as per HPI, including: General = no fevers, chills, dizziness,  fatigue HEENT/EYES = negative for loss of vision, double vision, blurred vision,  sore throa Cardiovascular= negative for chest pain, palpitation Respiratory/lungs= negative for shortness of breath,  wheezing; hemoptysis,  Gastrointestinal= negative for nausea, vomiting, abdominal pain Genitourinary= negative for Dysuria MSK = Negative for arthralgia, myalgias Neurology= Negative for headache, numbness, tingling  Psychiatry= Negative for suicidal and homocidal ideation Skin= Negative for Rash  Examination:  Constitutional: Not in acute distress, elderly frail Respiratory: Minimal bibasilar rhonchi Cardiovascular: Normal sinus rhythm, no rubs Abdomen: Nontender nondistended good bowel sounds Musculoskeletal: No edema noted Skin: No rashes seen Neurologic: CN 2-12 grossly intact.  And nonfocal Psychiatric: Normal judgment and insight. Alert and oriented x 3. Normal mood.      Objective: Vitals:   08/26/19 0742 08/26/19 1538 08/26/19 2218 08/27/19 0726  BP: (!) 157/73 (!) 118/55 (!) 105/53   Pulse: 60 61 63   Resp: 16 16 20    Temp: 98 F (36.7 C) 99.1 F (37.3 C) 97.9 F (36.6 C)   TempSrc:   Oral   SpO2: 96% 97% 97% 97%  Weight:      Height:        Intake/Output Summary (Last 24 hours) at 08/27/2019 1104 Last data filed at 08/27/2019 0614 Gross per 24 hour  Intake --  Output 350 ml  Net -350 ml   Filed Weights   08/25/19 1553  Weight: 49.4 kg     Data Reviewed:   CBC: Recent Labs   Lab 08/23/19 1623 08/24/19 0321 08/25/19 1525 08/27/19 0117  WBC 9.6 6.3 9.9 7.3  HGB 11.1* 10.9* 12.3 11.5*  HCT 34.3* 32.6* 37.4 34.5*  MCV 97.4 95.6 96.4 96.4  PLT 202 172 226 423   Basic Metabolic Panel: Recent Labs  Lab 08/23/19 1623 08/24/19 0321 08/25/19 1525 08/27/19 0117  NA 136 134* 136 137  K 3.1* 3.2* 3.7 3.1*  CL 95* 96* 99 99  CO2 28 29 25 25   GLUCOSE 111* 158* 155* 143*  BUN 14 15 24* 24*  CREATININE 1.17* 1.07* 1.07* 1.46*  CALCIUM 8.9 8.8* 9.1 8.4*  MG  --   --   --  1.8   GFR: Estimated Creatinine Clearance: 20 mL/min (A) (by C-G formula based on SCr of 1.46 mg/dL (H)). Liver Function Tests: Recent Labs  Lab 08/24/19 0321 08/25/19 1525  AST 26 43*  ALT 20 26  ALKPHOS 46 56  BILITOT 1.6* 1.1  PROT 6.1* 6.7  ALBUMIN 3.2* 3.5   No results for input(s): LIPASE, AMYLASE in the last 168 hours. No results for input(s): AMMONIA in the last 168 hours. Coagulation Profile: No results for input(s): INR, PROTIME in the last 168 hours. Cardiac Enzymes: Recent Labs  Lab 08/25/19 1525  CKTOTAL 263*   BNP (last 3 results) No results for input(s): PROBNP in the last 8760 hours. HbA1C: Recent Labs    08/27/19 0117  HGBA1C 5.3   CBG: Recent Labs  Lab 08/24/19 1138 08/25/19 1628  GLUCAP 115* 109*   Lipid Profile: Recent Labs    08/27/19 0117  CHOL 159  HDL 64  LDLCALC 83  TRIG 60  CHOLHDL 2.5   Thyroid Function Tests: No results for input(s): TSH, T4TOTAL, FREET4, T3FREE, THYROIDAB in the last 72 hours. Anemia Panel: No results for input(s): VITAMINB12, FOLATE, FERRITIN, TIBC, IRON, RETICCTPCT in the last 72 hours. Sepsis Labs: Recent Labs  Lab 08/25/19 1842 08/26/19 0652 08/27/19 0117  PROCALCITON <0.10 <0.10 <0.10    Recent Results (from the past 240 hour(s))  SARS CORONAVIRUS 2 (TAT 6-24 HRS) Nasopharyngeal Nasopharyngeal Swab     Status: None   Collection Time: 08/23/19  7:45 PM   Specimen: Nasopharyngeal Swab  Result  Value Ref Range Status   SARS Coronavirus 2 NEGATIVE NEGATIVE Final    Comment: (NOTE) SARS-CoV-2 target nucleic acids are NOT DETECTED. The SARS-CoV-2 RNA is generally detectable in upper and lower respiratory specimens during the acute phase of infection. Negative results do not preclude SARS-CoV-2 infection, do not rule out co-infections with other pathogens, and should not be used as the sole basis for treatment or other patient management decisions. Negative results must be combined with clinical observations, patient history, and epidemiological information. The expected result is Negative. Fact Sheet for Patients: SugarRoll.be Fact Sheet for Healthcare Providers: https://www.woods-mathews.com/ This test is not yet approved or cleared by the [place] FDA and  has been authorized for detection and/or diagnosis of SARS-CoV-2 by FDA under an Emergency Use Authorization (EUA). This EUA will remain  in effect (meaning this test can be used) for the duration of the COVID-19 declaration under Section 56 4(b)(1) of the Act, 21 U.S.C. section 360bbb-3(b)(1), unless the authorization is terminated or revoked sooner. Performed at [facility], [place] [address] [place], [place] [address]   SARS CORONAVIRUS 2 (TAT 6-24 HRS) Nasopharyngeal Nasopharyngeal Swab     Status: None  Collection Time: 08/25/19  6:04 PM   Specimen: Nasopharyngeal Swab  Result Value Ref Range Status   SARS Coronavirus 2 NEGATIVE NEGATIVE Final    Comment: (NOTE) SARS-CoV-2 target nucleic acids are NOT DETECTED. The SARS-CoV-2 RNA is generally detectable in upper and lower respiratory specimens during the acute phase of infection. Negative results do not preclude SARS-CoV-2 infection, do not rule out co-infections with other pathogens, and should not be used as the sole basis for treatment or other patient management decisions. Negative results must be combined  with clinical observations, patient history, and epidemiological information. The expected result is Negative. Fact Sheet for Patients: SugarRoll.be Fact Sheet for Healthcare Providers: https://www.woods-mathews.com/ This test is not yet approved or cleared by the [place] FDA and  has been authorized for detection and/or diagnosis of SARS-CoV-2 by FDA under an Emergency Use Authorization (EUA). This EUA will remain  in effect (meaning this test can be used) for the duration of the COVID-19 declaration under Section 56 4(b)(1) of the Act, 21 U.S.C. section 360bbb-3(b)(1), unless the authorization is terminated or revoked sooner. Performed at [facility], [place] [address], [place], [place] [address]   Blood culture (routine x 2)     Status: None (Preliminary result)   Collection Time: 08/25/19  6:06 PM   Specimen: BLOOD RIGHT FOREARM  Result Value Ref Range Status   Specimen Description BLOOD RIGHT FOREARM  Final   Special Requests   Final    BOTTLES DRAWN AEROBIC AND ANAEROBIC Blood Culture results may not be optimal due to an inadequate volume of blood received in culture bottles   Culture   Final    NO GROWTH 2 DAYS Performed at [facility], [place] [address] [place], [place] [address]    Report Status PENDING  Incomplete  Blood culture (routine x 2)     Status: None (Preliminary result)   Collection Time: 08/25/19  6:06 PM   Specimen: BLOOD LEFT HAND  Result Value Ref Range Status   Specimen Description BLOOD LEFT HAND  Final   Special Requests   Final    BOTTLES DRAWN AEROBIC AND ANAEROBIC Blood Culture results may not be optimal due to an inadequate volume of blood received in culture bottles   Culture   Final    NO GROWTH 2 DAYS Performed at [facility], [place] [address] [place], [place] [address]    Report Status PENDING  Incomplete         Radiology Studies: CT Head Wo Contrast  Result Date:  08/25/2019 CLINICAL DATA:  Altered mental status. Encephalopathy. Patient was found down. EXAM: CT HEAD WITHOUT CONTRAST TECHNIQUE: Contiguous axial images were obtained from the base of the skull through the vertex without intravenous contrast. COMPARISON:  None. FINDINGS: Brain: No evidence of acute infarction, hemorrhage, hydrocephalus, extra-axial collection or mass lesion/mass effect. There multiple old bilateral lacunar infarcts, more prominent on the right than the left. Periventricular white matter lucency consistent with small vessel ischemic disease. Diffuse mild cerebral cortical atrophy with secondary ventricular dilatation. Vascular: No hyperdense vessel or unexpected calcification. Skull: Normal. Negative for fracture or focal lesion. Sinuses/Orbits: Normal. Other: None IMPRESSION: No acute abnormalities. 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 Electronically Signed   By: Lorriane Shire M.D.   On: 08/25/2019 17:55   MR BRAIN WO CONTRAST  Result Date: 08/26/2019 CLINICAL DATA:  Neuro deficit, subacute. EXAM: MRI HEAD WITHOUT CONTRAST TECHNIQUE: Multiplanar, multiecho pulse sequences of the brain  and surrounding structures were obtained without intravenous contrast. COMPARISON:  Noncontrast head CT 08/25/2019 FINDINGS: Brain: There is a 13 mm focus of restricted diffusion within the right thalamocapsular junction consistent with acute/early subacute infarction. There are additional punctate acute/early subacute infarcts within the right insular cortex (series 3, image 22) and right parietal cortex (series 3, image 30). There is an additional punctate focus of cortical diffusion weighted hyperintensity within the right occipital lobe which may reflect an additional acute/early subacute infarct versus artifact (series 3, image 16). Moderate to severe patchy and confluent T2/FLAIR hyperintensity within the cerebral white matter is nonspecific, but consistent with chronic small  vessel ischemic disease. Chronic infarcts in the basal ganglia, right thalamus and bilateral cerebellar hemispheres. To a lesser degree, chronic ischemic changes are also present within the pons. Moderate generalized parenchymal atrophy. Vascular: Flow voids maintained within the proximal large arterial vessels. Skull and upper cervical spine: No focal marrow lesion. Sinuses/Orbits: Visualized orbits demonstrate no acute abnormality. Minimal ethmoid sinus mucosal thickening. No significant mastoid effusion. IMPRESSION: 13 mm acute/early subacute infarct within the right thalamocapsular junction. Punctate acute/early subacute cortical infarcts, one within the right insula and one within the right parietal lobe. An additional punctate acute/early subacute cortical infarct is questioned within the right occipital lobe. Moderate to advanced chronic small vessel ischemic disease. Multiple chronic infarcts within the basal ganglia, right thalamus and bilateral cerebellar hemispheres. Electronically Signed   By: Kellie Simmering DO   On: 08/26/2019 14:21   DG Chest Port 1 View  Result Date: 08/25/2019 CLINICAL DATA:  Altered mental status.  Found on floor EXAM: PORTABLE CHEST 1 VIEW COMPARISON:  08/23/2019 FINDINGS: TAVR unchanged in position.  Dual lead pacemaker unchanged. Negative for heart failure.  No pleural effusion. Streaky markings in the lung apices have developed since the prior study and could be due to pneumonia. Follow-up recommended. IMPRESSION: Possible upper lobe infiltrates bilaterally. Electronically Signed   By: Franchot Gallo M.D.   On: 08/25/2019 16:20        Scheduled Meds: . calcium-vitamin D  1 tablet Oral Daily  . ferrous sulfate  325 mg Oral BID WC  . furosemide  40 mg Intravenous Once  . heparin  5,000 Units Subcutaneous Q12H  . ipratropium-albuterol  3 mL Nebulization TID  . metoprolol tartrate  25 mg Oral BID  . multivitamin with minerals  1 tablet Oral Daily  . pantoprazole  40  mg Oral Q0600   Continuous Infusions:    LOS: 2 days   Time spent= 35 mins    Xylon Croom Arsenio Loader, MD [facility]  If 7PM-7AM, please contact night-coverage  08/27/2019, 11:04 AM

## 2019-08-28 DIAGNOSIS — R195 Other fecal abnormalities: Secondary | ICD-10-CM

## 2019-08-28 DIAGNOSIS — I639 Cerebral infarction, unspecified: Secondary | ICD-10-CM

## 2019-08-28 LAB — QUANTIFERON-TB GOLD PLUS: QuantiFERON-TB Gold Plus: NEGATIVE

## 2019-08-28 LAB — QUANTIFERON-TB GOLD PLUS (RQFGPL)
QuantiFERON Mitogen Value: 2.09 IU/mL
QuantiFERON Nil Value: 0.05 IU/mL
QuantiFERON TB1 Ag Value: 0.05 IU/mL
QuantiFERON TB2 Ag Value: 0.06 IU/mL

## 2019-08-28 LAB — BASIC METABOLIC PANEL
Anion gap: 9 (ref 5–15)
BUN: 24 mg/dL — ABNORMAL HIGH (ref 8–23)
CO2: 29 mmol/L (ref 22–32)
Calcium: 8.6 mg/dL — ABNORMAL LOW (ref 8.9–10.3)
Chloride: 99 mmol/L (ref 98–111)
Creatinine, Ser: 1.33 mg/dL — ABNORMAL HIGH (ref 0.44–1.00)
GFR calc Af Amer: 41 mL/min — ABNORMAL LOW (ref >60)
GFR calc non Af Amer: 35 mL/min — ABNORMAL LOW (ref >60)
Glucose, Bld: 151 mg/dL — ABNORMAL HIGH (ref 70–99)
Potassium: 4 mmol/L (ref 3.5–5.1)
Sodium: 137 mmol/L (ref 135–145)

## 2019-08-28 LAB — CBC
HCT: 32.4 % — ABNORMAL LOW (ref 36.0–46.0)
Hemoglobin: 10.6 g/dL — ABNORMAL LOW (ref 12.0–15.0)
MCH: 31.8 pg (ref 26.0–34.0)
MCHC: 32.7 g/dL (ref 30.0–36.0)
MCV: 97.3 fL (ref 80.0–100.0)
Platelets: 168 10*3/uL (ref 150–400)
RBC: 3.33 MIL/uL — ABNORMAL LOW (ref 3.87–5.11)
RDW: 13.5 % (ref 11.5–15.5)
WBC: 5.6 10*3/uL (ref 4.0–10.5)
nRBC: 0 % (ref 0.0–0.2)

## 2019-08-28 LAB — FOLATE RBC
Folate, Hemolysate: 567 ng/mL
Folate, RBC: 1777 ng/mL (ref >498)
Hematocrit: 31.9 % — ABNORMAL LOW (ref 34.0–46.6)

## 2019-08-28 LAB — MYCOPLASMA PNEUMONIAE ANTIBODY, IGM: Mycoplasma pneumo IgM: <770 U/mL (ref 0–769)

## 2019-08-28 LAB — MAGNESIUM: Magnesium: 2 mg/dL (ref 1.7–2.4)

## 2019-08-28 MED ORDER — IPRATROPIUM-ALBUTEROL 0.5-2.5 (3) MG/3ML IN SOLN
3.0000 mL | Freq: Two times a day (BID) | RESPIRATORY_TRACT | Status: DC
  Administered 2019-08-28 – 2019-08-29 (×3): 3 mL via RESPIRATORY_TRACT
  Filled 2019-08-28 (×3): qty 3

## 2019-08-28 MED ORDER — PANTOPRAZOLE SODIUM 40 MG PO TBEC
40.0000 mg | DELAYED_RELEASE_TABLET | Freq: Two times a day (BID) | ORAL | Status: DC
  Administered 2019-08-28 – 2019-08-30 (×4): 40 mg via ORAL
  Filled 2019-08-28 (×4): qty 1

## 2019-08-28 NOTE — Progress Notes (Signed)
Physical Therapy Treatment Patient Details Name: Crystal Cooper MRN: [id number] DOB: [birth year] Today's Date: 08/28/2019    History of Present Illness 86 y.o. female admitted to ED 3/2 after being found on floor by family at home, recent admission 2/28-3/1 for angioedema. Current medical diagnoses of bacterial PNA, AMS.  PMH includes hypertension, reactive airway disease, paroxysmal atrial fibrillation, aortic stenosis s/p TAVR, pacemaker, osteoporosis, hearing loss, diastolic dysfunction CHF.    PT Comments    Pt received in recliner. Daughter present in room. Pt required min guard assist transfers and ambulation with RW 150'. Steady gait with RW. Increased difficulty managing RW during turns. Pt returned to recliner at end of session. Current POC remains appropriate.    Follow Up Recommendations  Home health PT;Supervision/Assistance - 24 hour     Equipment Recommendations  None recommended by PT    Recommendations for Other Services       Precautions / Restrictions Precautions Precautions: Fall Restrictions Weight Bearing Restrictions: No    Mobility  Bed Mobility Overal bed mobility: Needs Assistance Bed Mobility: Supine to Sit     Supine to sit: Min assist;HOB elevated     General bed mobility comments: Pt received in recliner and returned to recliner.  Transfers Overall transfer level: Needs assistance Equipment used: Ambulation equipment used Transfers: Sit to/from Stand Sit to Stand: Min guard         General transfer comment: min guard for safety  Ambulation/Gait Ambulation/Gait assistance: Min guard Gait Distance (Feet): 150 Feet Assistive device: Rolling walker (2 wheeled) Gait Pattern/deviations: Step-through pattern;Decreased stride length;Trunk flexed Gait velocity: decreased Gait velocity interpretation: <1.31 ft/sec, indicative of household ambulator General Gait Details: min guard for safety. Increased difficulty turning with RW. Shoes donned  for gait.   Stairs             Wheelchair Mobility    Modified Rankin (Stroke Patients Only)       Balance Overall balance assessment: Needs assistance;History of Falls Sitting-balance support: No upper extremity supported;Feet supported Sitting balance-Leahy Scale: Fair     Standing balance support: Bilateral upper extremity supported;During functional activity Standing balance-Leahy Scale: Poor Standing balance comment: reliant on external support                            Cognition Arousal/Alertness: Awake/alert Behavior During Therapy: WFL for tasks assessed/performed Overall Cognitive Status: Impaired/Different from baseline Area of Impairment: Orientation;Attention;Memory;Following commands;Problem solving                 Orientation Level: Time Current Attention Level: Selective Memory: Decreased short-term memory Following Commands: Follows one step commands consistently Safety/Judgement: Decreased awareness of deficits;Decreased awareness of safety   Problem Solving: Slow processing General Comments: Very HOH and does not have her hearing aids. Daughter present in room. Difficult to discern hearing vs cognitive impairment.      Exercises      General Comments        Pertinent Vitals/Pain Pain Assessment: Faces Faces Pain Scale: Hurts a little bit Pain Location: R knee with walking Pain Descriptors / Indicators: Discomfort Pain Intervention(s): Monitored during session;Repositioned    Home Living                      Prior Function            PT Goals (current goals can now be found in the care plan section) Acute Rehab PT Goals Patient Stated  Goal: home Progress towards PT goals: Progressing toward goals    Frequency    Min 3X/week      PT Plan Current plan remains appropriate    Co-evaluation              AM-PAC PT "6 Clicks" Mobility   Outcome Measure  Help needed turning from your back  to your side while in a flat bed without using bedrails?: A Little Help needed moving from lying on your back to sitting on the side of a flat bed without using bedrails?: A Little Help needed moving to and from a bed to a chair (including a wheelchair)?: A Little Help needed standing up from a chair using your arms (e.g., wheelchair or bedside chair)?: A Little Help needed to walk in hospital room?: A Little Help needed climbing 3-5 steps with a railing? : A Lot 6 Click Score: 17    End of Session Equipment Utilized During Treatment: Gait belt Activity Tolerance: Patient tolerated treatment well Patient left: in chair;with chair alarm set;with call bell/phone within reach;with family/visitor present Nurse Communication: Mobility status PT Visit Diagnosis: Muscle weakness (generalized) (M62.81);Unsteadiness on feet (R26.81)     Time: 2376-2831 PT Time Calculation (min) (ACUTE ONLY): 23 min  Charges:  $Gait Training: 23-37 mins                     Lorrin Goodell, [place]  Office # [phone] Pager [phone]    Lorriane Shire 08/28/2019, 12:52 PM

## 2019-08-28 NOTE — Consult Note (Addendum)
[facility] Consult: 10:57 AM 08/28/2019  LOS: 3 days    Referring Provider: Dr Reesa Chew  Primary Care Physician:  Baruch Gouty, FNP Primary Gastroenterologist:  Dr. Zenovia Jarred     Reason for Consultation: BRB per rectum.   HPI: Crystal Cooper is a 86 y.o. female.  A. fib, on anticoagulation intermittently for many years.  SVT.  S/p ablation.  Moderate to severe aortic stenosis.  S/p TAVR 2017.  SS, s/p pacemaker.  hypertension.  Diastolic CHF.  CKD 3a.  Reactive airway disease. Over last several years Eliquis stopped/restarted due to recurrent anemia.   Takes oral iron bid and PPI daily. 2008 colonoscopy.  Dr. Manus Rudd.  2 diminutive polyps (serrated adenoma, hyperplastic), few scattered diverticula.  Removed otherwise normal screening study. 03/2018 EGD for transfusion requiring anemia (2 PRBCs, Hb 5.4), FOBT positive: Nonobstructing/mild Schatzki ring.  2 cm HH.  Nonbleeding erosive gastropathy, bxd (mild chronic gastritis, no H. Pylori).  Mild bulbar duodenitis.  None bleeding duodenal AVM, eradicated with APC.  Admission for 2/28  -08/24/2019 w ACE inhibitor induced angioedema.  Hypokalemia was repleted. Patient now admitted with acute right thalamic/occipital CVA, outside window for TPA. She was started on full-strength aspirin (1 dose on 3/4), SQ heparin.  Neurology would like to start Eliquis. Additionally has bil upper lobe lung infiltrates concerning for pneumonia and aspiration, received 1 dose Rocephin, Zithromax after which antibiotics discontinued..  Per speech study she is at risk for mild aspiration but has been cleared for regular diet with thin liquids.  Last night the patient had a brown/reddish-looking stool which tested FOBT +.  PT this morning saw a dark looking stool. Patient stools  normally are quite dark since she takes iron twice a day.  Pt denies abdominal pain, loss of appetite, nausea, heartburn, dysphagia, bleeding per rectum, constipation. Hgb 10.9  >> 12.3 >> 10.6 (this AM) since admission.  Hgb 11.1 on 08/23/2019.  MCV normal.     Past Medical History:  Diagnosis Date  . Anemia    years ago  . Aortic stenosis   . Arthritis   . Asthma   . Atrial fibrillation ([place])   . CHF (congestive heart failure) ([place]) 11/2014  . Family history of adverse reaction to anesthesia    2 daughters would have N/V  . Glaucoma   . Hearing loss   . Heart murmur   . HTN (hypertension)   . Pneumonia   . Prolapsing mitral leaflet syndrome   . Shortness of breath dyspnea    with exertion  . SVT (supraventricular tachycardia) ([facility])    S/P ablation of AVNRT in 2003    Past Surgical History:  Procedure Laterality Date  . APPENDECTOMY    . BIOPSY  04/07/2018   Procedure: BIOPSY;  Surgeon: Jerene Bears, MD;  Location: [facility] ENDOSCOPY;  Service: Gastroenterology;;  . BLADDER SURGERY    . CARDIAC CATHETERIZATION N/A 09/26/2015   Procedure: Right/Left Heart Cath and Coronary Angiography;  Surgeon: Burnell Blanks, MD;  Location: [place] CV LAB;  Service: Cardiovascular;  Laterality: N/A;  . CARDIAC SURGERY    . CARDIOVERSION N/A 03/02/2016   Procedure: CARDIOVERSION;  Surgeon: Thayer Headings, MD;  Location: [place];  Service: Cardiovascular;  Laterality: N/A;  . EP IMPLANTABLE DEVICE N/A 10/26/2015   Procedure: Pacemaker Implant;  Surgeon: Will Meredith Leeds, MD;  Location: [place] CV LAB;  Service: Cardiovascular;  Laterality: N/A;  . ESOPHAGOGASTRODUODENOSCOPY (EGD) WITH PROPOFOL N/A 04/07/2018   Procedure: ESOPHAGOGASTRODUODENOSCOPY (EGD) WITH PROPOFOL;  Surgeon: Jerene Bears, MD;  Location: [facility] ENDOSCOPY;  Service: Gastroenterology;  Laterality: N/A;  . EYE SURGERY Bilateral    cataract surgery  . HOT HEMOSTASIS N/A 04/07/2018   Procedure: HOT HEMOSTASIS  (ARGON PLASMA COAGULATION/BICAP);  Surgeon: Jerene Bears, MD;  Location: [facility] ENDOSCOPY;  Service: Gastroenterology;  Laterality: N/A;  . NASAL SINUS SURGERY    . TEE WITHOUT CARDIOVERSION N/A 10/25/2015   Procedure: TRANSESOPHAGEAL ECHOCARDIOGRAM (TEE);  Surgeon: Burnell Blanks, MD;  Location: [place];  Service: Open Heart Surgery;  Laterality: N/A;  . TRANSCATHETER AORTIC VALVE REPLACEMENT, TRANSFEMORAL Right 10/25/2015   Procedure: TRANSCATHETER AORTIC VALVE REPLACEMENT, TRANSFEMORAL;  Surgeon: Burnell Blanks, MD;  Location: [place];  Service: Open Heart Surgery;  Laterality: Right;    Prior to Admission medications   Medication Sig Start Date End Date Taking? Authorizing Provider  acetaminophen (TYLENOL) 500 MG tablet Take 500 mg by mouth every 6 (six) hours as needed for mild pain or headache.    Yes [provider]  albuterol (PROAIR HFA) 108 (90 Base) MCG/ACT inhaler Inhale 2 puffs into the lungs every 4 (four) hours as needed for wheezing or shortness of breath. 08/05/19  Yes RakesConnye Burkitt, FNP  Biotin 10 MG CAPS Take 10 mg by mouth daily.    Yes [provider]  Calcium Carb-Cholecalciferol (CALTRATE 600+D3) 600-800 MG-UNIT TABS Take 1 tablet by mouth daily.   Yes [provider]  docusate sodium (COLACE) 100 MG capsule Take 100 mg by mouth daily as needed for mild constipation.   Yes [provider]  enalapril (VASOTEC) 2.5 MG tablet Take 2.5 mg by mouth daily.   Yes [provider]  ferrous sulfate 325 (65 FE) MG tablet Take 1 tablet (325 mg total) by mouth 2 (two) times daily with a meal. 04/08/18  Yes Vann, Jessica U, DO  furosemide (LASIX) 40 MG tablet Take 1.5 tablets (60 mg total) by mouth daily. 03/24/19  Yes Rakes, Connye Burkitt, FNP  metoprolol tartrate (LOPRESSOR) 25 MG tablet Take 1 tablet (25 mg total) by mouth 2 (two) times daily. 05/20/19  Yes Camnitz, Will Hassell Done, MD  multivitamin-iron-minerals-folic acid (CENTRUM) chewable  tablet Chew 1 tablet by mouth daily.   Yes [provider]  pantoprazole (PROTONIX) 40 MG tablet Take 1 tablet (40 mg total) by mouth daily at 6 (six) AM. 03/24/19 08/25/19 Yes Rakes, Connye Burkitt, FNP    Scheduled Meds: . calcium-vitamin D  1 tablet Oral Daily  . ferrous sulfate  325 mg Oral BID WC  . furosemide  40 mg Intravenous Once  . ipratropium-albuterol  3 mL Nebulization TID  . metoprolol tartrate  25 mg Oral BID  . multivitamin with minerals  1 tablet Oral Daily  . pantoprazole  40 mg Oral Q0600   Infusions:  PRN Meds: albuterol, docusate sodium, hydrALAZINE, polyethylene glycol, senna-docusate   Allergies as of 08/25/2019 - Review Complete 08/25/2019  Allergen Reaction Noted  . Hctz [hydrochlorothiazide] Other (See Comments)   . Aspirin Other (See Comments) 08/23/2019  .  Codeine Other (See Comments) 01/11/2016    Family History  Problem Relation Age of Onset  . Hypertension Mother   . Pneumonia Father   . Heart attack Neg Hx   . Colon cancer Neg Hx   . Esophageal cancer Neg Hx     Social History   Socioeconomic History  . Marital status: Widowed    Spouse name: Not on file  . Number of children: 3  . Years of education: Not on file  . Highest education level: 12th grade  Occupational History  . Occupation: Retired  Tobacco Use  . Smoking status: Never Smoker  . Smokeless tobacco: Never Used  Substance and Sexual Activity  . Alcohol use: No    Alcohol/week: 0.0 standard drinks  . Drug use: No  . Sexual activity: Never  Other Topics Concern  . Not on file  Social History Narrative  . Not on file   Social Determinants of Health   Financial Resource Strain:   . Difficulty of Paying Living Expenses: Not on file  Food Insecurity: No Food Insecurity  . Worried About Charity fundraiser in the Last Year: Never true  . Ran Out of Food in the Last Year: Never true  Transportation Needs: No Transportation Needs  . Lack of Transportation (Medical): No   . Lack of Transportation (Non-Medical): No  Physical Activity: Inactive  . Days of Exercise per Week: 0 days  . Minutes of Exercise per Session: 0 min  Stress: No Stress Concern Present  . Feeling of Stress : Not at all  Social Connections: Slightly Isolated  . Frequency of Communication with Friends and Family: More than three times a week  . Frequency of Social Gatherings with Friends and Family: More than three times a week  . Attends Religious Services: More than 4 times per year  . Active Member of Clubs or Organizations: Yes  . Attends Archivist Meetings: More than 4 times per year  . Marital Status: Widowed  Intimate Partner Violence: Not At Risk  . Fear of Current or Ex-Partner: No  . Emotionally Abused: No  . Physically Abused: No  . Sexually Abused: No    REVIEW OF SYSTEMS: Constitutional: No fatigue, no weakness. ENT:  No nose bleeds Pulm: No shortness of breath, no cough. CV:  No palpitations, no LE edema.  No chest pain GU:  No hematuria, no frequency GI: See HPI. Heme: Denies unusual or excessive bleeding or bruising. Transfusions: Received PRBCs in 10/2015, 03/2018. Neuro:  No headaches, no peripheral tingling or numbness.  No syncope, no seizures. Derm:  No itching, no rash or sores.  Endocrine:  No sweats or chills.  No polyuria or dysuria Immunization: Up-to-date on multiple vaccinations.  She has completed the 2 series of COVID-19 vaccines. Travel:  None beyond local counties in last few months.    PHYSICAL EXAM: Vital signs in last 24 hours: Vitals:   08/28/19 0808 08/28/19 0924  BP: 119/90 113/62  Pulse: 62 75  Resp: 20 19  Temp: 97.8 F (36.6 C)   SpO2: 96% 97%   Wt Readings from Last 3 Encounters:  08/25/19 49.4 kg  08/24/19 49.3 kg  06/30/19 51.5 kg    General: Pleasant, hard of hearing, comfortable.  Aged but not unwell appearing. Head: No facial asymmetry or swelling.  No signs of head trauma. Eyes: No scleral icterus or  conjunctival pallor.  EOMI. Ears: HOH. Nose: No discharge or congestion Mouth: Full dentures in place, not  removed for exam.  Tongue midline.  Mucosa pink, moist, clear Neck: No JVD, no masses, no thyromegaly. Lungs: Clear bilaterally with good breath sounds.  No cough.  No dyspnea. Heart: Pacemaker visible in position on left upper chest.  RRR.  No MRG.  S1, S2 present Abdomen: Soft.  Not tender, not distended.  No HSM, masses, bruits, hernias.   Rectal: Deferred as she was sitting in the lounge chair rectal exam was not feasible.  Hope to return later today when she is back in bed to perform. Musc/Skeltl: No joint swelling, redness, gross deformity. Extremities: No CCE. Neurologic: Hard of hearing.  Forgetful of dates but knows where she is and what is going on.  Moves all 4 limbs.  Strength not tested.  No impaired speech. Skin: Thin, no significant purpura, rashes, lacerations. Tattoos: None observed Nodes: No cervical adenopathy. Psych: Very pleasant, calm, quiet.  In good spirits  Intake/Output from previous day: No intake/output data recorded. Intake/Output this shift: Total I/O In: 240 [P.O.:240] Out: -   LAB RESULTS: Recent Labs    08/25/19 1525 08/27/19 0117 08/28/19 0112  WBC 9.9 7.3 5.6  HGB 12.3 11.5* 10.6*  HCT 37.4 34.5* 32.4*  PLT 226 190 168   BMET Lab Results  Component Value Date   NA 137 08/28/2019   NA 137 08/27/2019   NA 136 08/25/2019   K 4.0 08/28/2019   K 3.1 (L) 08/27/2019   K 3.7 08/25/2019   CL 99 08/28/2019   CL 99 08/27/2019   CL 99 08/25/2019   CO2 29 08/28/2019   CO2 25 08/27/2019   CO2 25 08/25/2019   GLUCOSE 151 (H) 08/28/2019   GLUCOSE 143 (H) 08/27/2019   GLUCOSE 155 (H) 08/25/2019   BUN 24 (H) 08/28/2019   BUN 24 (H) 08/27/2019   BUN 24 (H) 08/25/2019   CREATININE 1.33 (H) 08/28/2019   CREATININE 1.46 (H) 08/27/2019   CREATININE 1.07 (H) 08/25/2019   CALCIUM 8.6 (L) 08/28/2019   CALCIUM 8.4 (L) 08/27/2019   CALCIUM 9.1  08/25/2019   LFT Recent Labs    08/25/19 1525  PROT 6.7  ALBUMIN 3.5  AST 43*  ALT 26  ALKPHOS 56  BILITOT 1.1   PT/INR Lab Results  Component Value Date   INR 1.17 04/07/2018   INR 1.54 (H) 10/25/2015   INR 1.25 10/25/2015     RADIOLOGY STUDIES: MR BRAIN WO CONTRAST  Result Date: 08/26/2019 CLINICAL DATA:  Neuro deficit, subacute. EXAM: MRI HEAD WITHOUT CONTRAST TECHNIQUE: Multiplanar, multiecho pulse sequences of the brain and surrounding structures were obtained without intravenous contrast. COMPARISON:  Noncontrast head CT 08/25/2019 FINDINGS: Brain: There is a 13 mm focus of restricted diffusion within the right thalamocapsular junction consistent with acute/early subacute infarction. There are additional punctate acute/early subacute infarcts within the right insular cortex (series 3, image 22) and right parietal cortex (series 3, image 30). There is an additional punctate focus of cortical diffusion weighted hyperintensity within the right occipital lobe which may reflect an additional acute/early subacute infarct versus artifact (series 3, image 16). Moderate to severe patchy and confluent T2/FLAIR hyperintensity within the cerebral white matter is nonspecific, but consistent with chronic small vessel ischemic disease. Chronic infarcts in the basal ganglia, right thalamus and bilateral cerebellar hemispheres. To a lesser degree, chronic ischemic changes are also present within the pons. Moderate generalized parenchymal atrophy. Vascular: Flow voids maintained within the proximal large arterial vessels. Skull and upper cervical spine: No focal marrow lesion. Sinuses/Orbits:  Visualized orbits demonstrate no acute abnormality. Minimal ethmoid sinus mucosal thickening. No significant mastoid effusion. IMPRESSION: 13 mm acute/early subacute infarct within the right thalamocapsular junction. Punctate acute/early subacute cortical infarcts, one within the right insula and one within the  right parietal lobe. An additional punctate acute/early subacute cortical infarct is questioned within the right occipital lobe. Moderate to advanced chronic small vessel ischemic disease. Multiple chronic infarcts within the basal ganglia, right thalamus and bilateral cerebellar hemispheres. Electronically Signed   By: Kellie Simmering DO   On: 08/26/2019 14:21   VAS US CAROTID  Result Date: 08/27/2019  Summary: Right Carotid: Velocities in the right ICA are consistent with a 1-39% stenosis. Left Carotid: Velocities in the left ICA are consistent with a 1-39% stenosis. Vertebrals: Bilateral vertebral arteries demonstrate antegrade flow. *See table(s) above for measurements and observations.  Electronically signed by Servando Snare MD on 08/27/2019 at 1:13:43 PM.    Final       IMPRESSION:   *   Reddish-looking, FOBT + stool last night, darker stool this morning. Hemodynamically stable. At baseline she has dark stools due to twice daily iron. 2008 Colonoscopy w a few diverticula, one hyperplastic and 1 serrated adenomatous polyp.   03/2018 EGD w Schatzki ring, hiatal hernia, nonbleeding gastritis, duodenitis, eradication of nonbleeding duodenal AVM   *    Today's Hgb is consistent with level at admission but down not quite 2 g from maximum level.  History iron deficiency attributed to blood loss from small bowel AVM bleeding.    *   Acute CVA.  Neurologically doing well with minimal visual field deficit NIH stroke score of 1.  Restart Eliquis indicated.  So far has been treated with full dose aspirin once yesterday and SQ heparin, both now discontinued.    *    Atrial fibrillation.  Longstanding.  Eliquis previously discontinued due to GI bleeding, anemia.    PLAN:     *  Per Dr Fuller Plan.  ? EGD?[name]  At her age would prefer to avoid colonoscopy, would flex sig be feasible?  *   I suspect she can resume the subcu heparin but will clear with Dr. Fuller Plan.  *    Follow CBC.   Azucena Freed  08/28/2019,  10:57 AM Phone [phone]     Attending Physician Note   I have taken a history, examined the patient and reviewed the chart. I agree with the Advanced Practitioner's note, impression and recommendations.  Occult blood in stool and anemia in the setting of an acute CVA.  Afib previously anticoagulated however anticoagulation was discontinued due to recurrent anemia  After a discussion with patient and her daughter at the beside the decision was to try to avoid procedures given acute CVA and co-morbidities. She at higher risk of complications from anesthesia and endoscopic procedures. Trial of anticoagulation in hospital and observe. If GI significant GI bleeding will reassess the need for EGD and flex sigmoidoscopy/colonoscopy.    Lucio Edward, MD [facility]enter[facility]

## 2019-08-28 NOTE — Progress Notes (Signed)
PROGRESS NOTE    Crystal Cooper  ZOX:[id number] DOB: Nov 12, 1926 DOA: 08/25/2019 PCP: Baruch Gouty, FNP   Brief Narrative:  86 year old with history of HTN, asthma, paroxysmal A. fib, aortic stenosis status post surgery, hearing loss, CHF presented with altered mental status.  Recently admitted to the hospital for angioedema secondary to ACE inhibitor which was discontinued.  Reports of copious amount of runny nose for the past 3 weeks.  Upon admission chest x-ray showed bilateral upper lobe infiltrates suspect pneumonia and neurology team was consulted.  MRI was positive for acute/subacute multifocal CVA therefore neurology team consulted.  Assessment & Plan:   Principal Problem:   Acute CVA (cerebrovascular accident) ([place]) Active Problems:   Essential hypertension   PAF (paroxysmal atrial fibrillation) (HCC)   Asthma   S/P TAVR (transcatheter aortic valve replacement)   Diastolic dysfunction   CKD (chronic kidney disease), stage III   PNA (pneumonia)  Acute multifocal CVA likely secondary to atrial fibrillation, not on anticoagulation -MRI brain-consistent with multifocal CVA.  Stroke team consulted -Initial CT head-negative for acute pathology except chronic infarcts. -LDL 83, A1c 5.3 -Carotid Dopplers-1-39% stenosis bilaterally -Supportive care.  PT/OT-home health -We will formally consult gastroenterology for their input.  Given her previous history of GI bleed but now with acute CVA in the setting of A. fib not on anticoagulation I have discussed the case with neurology, gastroenterology- Dr Hilarie Fredrickson, and Cardiology- Dr Rebekah [place] this point it is best option to resume Eliquis and monitor closely for any signs of bleeding.  Iron studies have been performed.  I also discussed this with the daughter regarding this.  Bright red blood per rectum -Hemoccult positive, unknown exact source.  Does have previous history of angiodysplasia. -Hemodynamically stable but given her extent  history of GI bleed and possible benefits from Eliquis will consult gastroenterology for their input as well.  Bilateral upper lobe infiltrate concerning for pneumonia Aspiration/persistent postnasal drip -COVID-19 negative.  Procalcitonin negative.  Will stop antibiotics -BNP mildly elevated.  Hold off on further diuresis -Bronchodilators, incentive spirometer and flutter valve -Speech and swallow -mild aspiration risk, regular thin liquid okay. -Strep pneumo antigen, Legionella-negative  Presyncope- she has some autonomic orthostasis.  History of biosynthetic aortic valve status post TAVR  History of paroxysmal atrial fibrillation Sick sinus syndrome status post pacemaker -Safety of resumption for Eliquis will be discussed with GI.  Briefly discussed with Dr. Stanford Breed -Metoprolol 25 mg twice daily  Essential hypertension -Metoprolol twice daily  DVT prophylaxis: Subcutaneous heparin Code Status: Full code Family Communication: Daughter, [name] updated Disposition Plan:   Patient From= home  Patient Anticipated D/C place= to be determined  Barriers= maintain hospital stay for acute CVA evaluation, neurology team consulted.  Unsafe for discharge.  Subjective: Small episode of bright red blood per rectum this morning. No other complaints at this time. Has ok bleeding.   Review of Systems Otherwise negative except as per HPI, including: General = no fevers, chills, dizziness,  fatigue HEENT/EYES = negative for loss of vision, double vision, blurred vision,  sore throa Cardiovascular= negative for chest pain, palpitation Respiratory/lungs= negative for shortness of breath, cough, wheezing; hemoptysis,  Gastrointestinal= negative for nausea, vomiting, abdominal pain Genitourinary= negative for Dysuria MSK = Negative for arthralgia, myalgias Neurology= Negative for headache, numbness, tingling  Psychiatry= Negative for suicidal and homocidal ideation Skin= Negative for  Rash   Examination:  Constitutional: Not in acute distress Respiratory: b/l rhonchi at the bases, minimal.  Cardiovascular: Normal sinus rhythm, no rubs Abdomen:  Nontender nondistended good bowel sounds Musculoskeletal: No edema noted Skin: No rashes seen Neurologic: CN 2-12 grossly intact.  And nonfocal Psychiatric: Normal judgment and insight. Alert and oriented x 3. Normal mood.   Objective: Vitals:   08/27/19 2121 08/28/19 0709 08/28/19 0808 08/28/19 0924  BP: (!) 126/54  119/90 113/62  Pulse: 74  62 75  Resp: 16  20 19   Temp: 98.5 F (36.9 C)  97.8 F (36.6 C)   TempSrc:   Oral   SpO2: 98% 95% 96% 97%  Weight:      Height:        Intake/Output Summary (Last 24 hours) at 08/28/2019 1020 Last data filed at 08/28/2019 0900 Gross per 24 hour  Intake 240 ml  Output --  Net 240 ml   Filed Weights   08/25/19 1553  Weight: 49.4 kg     Data Reviewed:   CBC: Recent Labs  Lab 08/23/19 1623 08/24/19 0321 08/25/19 1525 08/27/19 0117 08/28/19 0112  WBC 9.6 6.3 9.9 7.3 5.6  HGB 11.1* 10.9* 12.3 11.5* 10.6*  HCT 34.3* 32.6* 37.4 34.5* 32.4*  MCV 97.4 95.6 96.4 96.4 97.3  PLT 202 172 226 190 938   Basic Metabolic Panel: Recent Labs  Lab 08/23/19 1623 08/24/19 0321 08/25/19 1525 08/27/19 0117 08/28/19 0112  NA 136 134* 136 137 137  K 3.1* 3.2* 3.7 3.1* 4.0  CL 95* 96* 99 99 99  CO2 28 29 25 25 29   GLUCOSE 111* 158* 155* 143* 151*  BUN 14 15 24* 24* 24*  CREATININE 1.17* 1.07* 1.07* 1.46* 1.33*  CALCIUM 8.9 8.8* 9.1 8.4* 8.6*  MG  --   --   --  1.8 2.0   GFR: Estimated Creatinine Clearance: 21.9 mL/min (A) (by C-G formula based on SCr of 1.33 mg/dL (H)). Liver Function Tests: Recent Labs  Lab 08/24/19 0321 08/25/19 1525  AST 26 43*  ALT 20 26  ALKPHOS 46 56  BILITOT 1.6* 1.1  PROT 6.1* 6.7  ALBUMIN 3.2* 3.5   No results for input(s): LIPASE, AMYLASE in the last 168 hours. No results for input(s): AMMONIA in the last 168 hours. Coagulation  Profile: No results for input(s): INR, PROTIME in the last 168 hours. Cardiac Enzymes: Recent Labs  Lab 08/25/19 1525  CKTOTAL 263*   BNP (last 3 results) No results for input(s): PROBNP in the last 8760 hours. HbA1C: Recent Labs    08/27/19 0117  HGBA1C 5.3   CBG: Recent Labs  Lab 08/24/19 1138 08/25/19 1628  GLUCAP 115* 109*   Lipid Profile: Recent Labs    08/27/19 0117  CHOL 159  HDL 64  LDLCALC 83  TRIG 60  CHOLHDL 2.5   Thyroid Function Tests: No results for input(s): TSH, T4TOTAL, FREET4, T3FREE, THYROIDAB in the last 72 hours. Anemia Panel: Recent Labs    08/27/19 1639  VITAMINB12 1,497*  FERRITIN 55  TIBC 263  IRON 57   Sepsis Labs: Recent Labs  Lab 08/25/19 1842 08/26/19 0652 08/27/19 0117  PROCALCITON <0.10 <0.10 <0.10    Recent Results (from the past 240 hour(s))  SARS CORONAVIRUS 2 (TAT 6-24 HRS) Nasopharyngeal Nasopharyngeal Swab     Status: None   Collection Time: 08/23/19  7:45 PM   Specimen: Nasopharyngeal Swab  Result Value Ref Range Status   SARS Coronavirus 2 NEGATIVE NEGATIVE Final    Comment: (NOTE) SARS-CoV-2 target nucleic acids are NOT DETECTED. The SARS-CoV-2 RNA is generally detectable in upper and lower respiratory specimens during the  acute phase of infection. Negative results do not preclude SARS-CoV-2 infection, do not rule out co-infections with other pathogens, and should not be used as the sole basis for treatment or other patient management decisions. Negative results must be combined with clinical observations, patient history, and epidemiological information. The expected result is Negative. Fact Sheet for Patients: SugarRoll.be Fact Sheet for Healthcare Providers: https://www.woods-mathews.com/ This test is not yet approved or cleared by the [place] FDA and  has been authorized for detection and/or diagnosis of SARS-CoV-2 by FDA under an Emergency Use  Authorization (EUA). This EUA will remain  in effect (meaning this test can be used) for the duration of the COVID-19 declaration under Section 56 4(b)(1) of the Act, 21 U.S.C. section 360bbb-3(b)(1), unless the authorization is terminated or revoked sooner. Performed at [facility], [place] [address], [place], [place] [address]   SARS CORONAVIRUS 2 (TAT 6-24 HRS) Nasopharyngeal Nasopharyngeal Swab     Status: None   Collection Time: 08/25/19  6:04 PM   Specimen: Nasopharyngeal Swab  Result Value Ref Range Status   SARS Coronavirus 2 NEGATIVE NEGATIVE Final    Comment: (NOTE) SARS-CoV-2 target nucleic acids are NOT DETECTED. The SARS-CoV-2 RNA is generally detectable in upper and lower respiratory specimens during the acute phase of infection. Negative results do not preclude SARS-CoV-2 infection, do not rule out co-infections with other pathogens, and should not be used as the sole basis for treatment or other patient management decisions. Negative results must be combined with clinical observations, patient history, and epidemiological information. The expected result is Negative. Fact Sheet for Patients: SugarRoll.be Fact Sheet for Healthcare Providers: https://www.woods-mathews.com/ This test is not yet approved or cleared by the [place] FDA and  has been authorized for detection and/or diagnosis of SARS-CoV-2 by FDA under an Emergency Use Authorization (EUA). This EUA will remain  in effect (meaning this test can be used) for the duration of the COVID-19 declaration under Section 56 4(b)(1) of the Act, 21 U.S.C. section 360bbb-3(b)(1), unless the authorization is terminated or revoked sooner. Performed at [facility], [place] [address], [place], [place] [address]   Blood culture (routine x 2)     Status: None (Preliminary result)   Collection Time: 08/25/19  6:06 PM   Specimen: BLOOD RIGHT FOREARM  Result Value Ref  Range Status   Specimen Description BLOOD RIGHT FOREARM  Final   Special Requests   Final    BOTTLES DRAWN AEROBIC AND ANAEROBIC Blood Culture results may not be optimal due to an inadequate volume of blood received in culture bottles   Culture   Final    NO GROWTH 2 DAYS Performed at [facility], [place] [address] [place], [place] [address]    Report Status PENDING  Incomplete  Blood culture (routine x 2)     Status: None (Preliminary result)   Collection Time: 08/25/19  6:06 PM   Specimen: BLOOD LEFT HAND  Result Value Ref Range Status   Specimen Description BLOOD LEFT HAND  Final   Special Requests   Final    BOTTLES DRAWN AEROBIC AND ANAEROBIC Blood Culture results may not be optimal due to an inadequate volume of blood received in culture bottles   Culture   Final    NO GROWTH 2 DAYS Performed at [facility], [place] [address], [place], [place] [address]    Report Status PENDING  Incomplete         Radiology Studies: MR  BRAIN WO CONTRAST  Result Date: 08/26/2019 CLINICAL DATA:  Neuro deficit, subacute. EXAM: MRI HEAD WITHOUT CONTRAST TECHNIQUE: Multiplanar, multiecho pulse sequences of the brain and surrounding structures were obtained without intravenous contrast. COMPARISON:  Noncontrast head CT 08/25/2019 FINDINGS: Brain: There is a 13 mm focus of restricted diffusion within the right thalamocapsular junction consistent with acute/early subacute infarction. There are additional punctate acute/early subacute infarcts within the right insular cortex (series 3, image 22) and right parietal cortex (series 3, image 30). There is an additional punctate focus of cortical diffusion weighted hyperintensity within the right occipital lobe which may reflect an additional acute/early subacute infarct versus artifact (series 3, image 16). Moderate to severe patchy and confluent T2/FLAIR hyperintensity within the cerebral white matter is nonspecific, but consistent with chronic small  vessel ischemic disease. Chronic infarcts in the basal ganglia, right thalamus and bilateral cerebellar hemispheres. To a lesser degree, chronic ischemic changes are also present within the pons. Moderate generalized parenchymal atrophy. Vascular: Flow voids maintained within the proximal large arterial vessels. Skull and upper cervical spine: No focal marrow lesion. Sinuses/Orbits: Visualized orbits demonstrate no acute abnormality. Minimal ethmoid sinus mucosal thickening. No significant mastoid effusion. IMPRESSION: 13 mm acute/early subacute infarct within the right thalamocapsular junction. Punctate acute/early subacute cortical infarcts, one within the right insula and one within the right parietal lobe. An additional punctate acute/early subacute cortical infarct is questioned within the right occipital lobe. Moderate to advanced chronic small vessel ischemic disease. Multiple chronic infarcts within the basal ganglia, right thalamus and bilateral cerebellar hemispheres. Electronically Signed   By: Kellie Simmering DO   On: 08/26/2019 14:21   VAS US CAROTID  Result Date: 08/27/2019 Carotid Arterial Duplex Study Indications:       CVA. Risk Factors:      Hypertension. Limitations        Today's exam was limited due to the body habitus of the                    patient and patient anatomy. Comparison Study:  No prior studies. Performing Technologist: Oliver Hum RVT  Examination Guidelines: A complete evaluation includes B-mode imaging, spectral Doppler, color Doppler, and power Doppler as needed of all accessible portions of each vessel. Bilateral testing is considered an integral part of a complete examination. Limited examinations for reoccurring indications may be performed as noted.  Right Carotid Findings: +----------+--------+--------+--------+-----------------------+--------+           PSV cm/sEDV cm/sStenosisPlaque Description     Comments  +----------+--------+--------+--------+-----------------------+--------+ CCA Prox  52      7               smooth and heterogenoustortuous +----------+--------+--------+--------+-----------------------+--------+ CCA Distal49      8               smooth and heterogenous         +----------+--------+--------+--------+-----------------------+--------+ ICA Prox  76      13              calcific and irregular          +----------+--------+--------+--------+-----------------------+--------+ ICA Distal52      10                                     tortuous +----------+--------+--------+--------+-----------------------+--------+ ECA       56                                                      +----------+--------+--------+--------+-----------------------+--------+ +----------+--------+-------+--------+-------------------+  PSV cm/sEDV cmsDescribeArm Pressure (mmHG) +----------+--------+-------+--------+-------------------+ [id number]                                         +----------+--------+-------+--------+-------------------+ +---------+--------+--+--------+-+---------+ VertebralPSV cm/s28EDV cm/s4Antegrade +---------+--------+--+--------+-+---------+  Left Carotid Findings: +----------+--------+--------+--------+-----------------------+--------+           PSV cm/sEDV cm/sStenosisPlaque Description     Comments +----------+--------+--------+--------+-----------------------+--------+ CCA Prox  62      9               smooth and heterogenoustortuous +----------+--------+--------+--------+-----------------------+--------+ CCA Distal53      11              smooth and heterogenous         +----------+--------+--------+--------+-----------------------+--------+ ICA Prox  43      9               calcific and irregular          +----------+--------+--------+--------+-----------------------+--------+ ICA Distal49      11                                      tortuous +----------+--------+--------+--------+-----------------------+--------+ ECA       63      0                                               +----------+--------+--------+--------+-----------------------+--------+ +----------+--------+--------+--------+-------------------+           PSV cm/sEDV cm/sDescribeArm Pressure (mmHG) +----------+--------+--------+--------+-------------------+ [id number]                                          +----------+--------+--------+--------+-------------------+ +---------+--------+--+--------+--+---------+ VertebralPSV cm/s37EDV cm/s11Antegrade +---------+--------+--+--------+--+---------+   Summary: Right Carotid: Velocities in the right ICA are consistent with a 1-39% stenosis. Left Carotid: Velocities in the left ICA are consistent with a 1-39% stenosis. Vertebrals: Bilateral vertebral arteries demonstrate antegrade flow. *See table(s) above for measurements and observations.  Electronically signed by Servando Snare MD on 08/27/2019 at 1:13:43 PM.    Final    Scheduled Meds: . calcium-vitamin D  1 tablet Oral Daily  . ferrous sulfate  325 mg Oral BID WC  . furosemide  40 mg Intravenous Once  . ipratropium-albuterol  3 mL Nebulization TID  . metoprolol tartrate  25 mg Oral BID  . multivitamin with minerals  1 tablet Oral Daily  . pantoprazole  40 mg Oral Q0600   Continuous Infusions:   LOS: 3 days   Time spent= 35 mins  Mechele Kittleson Arsenio Loader, MD [facility]  If 7PM-7AM, please contact night-coverage  08/28/2019, 10:20 AM

## 2019-08-28 NOTE — Progress Notes (Signed)
Occupational Therapy Treatment Patient Details Name: Crystal Cooper MRN: [id number] DOB: [birth year] Today's Date: 08/28/2019    History of present illness 86 y.o. female admitted to ED 3/2 after being found on floor by family at home, recent admission 2/28-3/1 for angioedema. Current medical diagnoses of bacterial PNA, AMS.  PMH includes hypertension, reactive airway disease, paroxysmal atrial fibrillation, aortic stenosis s/p TAVR, pacemaker, osteoporosis, hearing loss, diastolic dysfunction CHF.   OT comments  Patient supine in bed finishing breakfast on arrival.  Patient was better oriented today, only incorrect on time (2 days off).  She was still presenting with poor initiation and sequencing during tasks, requiring multiple cues to begin toileting and grooming at sink and frequent cues for next step.  Patient had bowel movement in toilet that was bloody, RN notified.  She was min guard/min assist with mobility.  Patient enjoys makeup and was happy to put on lipstick in front of the mirror.  Will continue to follow with OT acutely to address the deficits listed below and improve safety.      Follow Up Recommendations  Home health OT;Supervision - Intermittent    Equipment Recommendations  None recommended by OT    Recommendations for Other Services      Precautions / Restrictions Precautions Precautions: Fall Restrictions Weight Bearing Restrictions: No       Mobility Bed Mobility Overal bed mobility: Needs Assistance Bed Mobility: Supine to Sit     Supine to sit: Min assist;HOB elevated        Transfers Overall transfer level: Needs assistance Equipment used: Rolling walker (2 wheeled) Transfers: Sit to/from Stand Sit to Stand: Min guard              Balance Overall balance assessment: Needs assistance;History of Falls Sitting-balance support: Single extremity supported;Feet unsupported Sitting balance-Leahy Scale: Fair     Standing balance support:  Bilateral upper extremity supported;During functional activity Standing balance-Leahy Scale: Poor Standing balance comment: reliant on external support                           ADL either performed or assessed with clinical judgement   ADL Overall ADL's : Needs assistance/impaired Eating/Feeding: Set up;Sitting   Grooming: Wash/dry hands;Wash/dry face;Standing;Min guard(Applied makeup)                   Armed forces technical officer: Minimal assistance;Ambulation;Regular Toilet;RW   Toileting- Water quality scientist and Hygiene: Min guard;Sit to/from stand       Functional mobility during ADLs: Min guard;Rolling walker General ADL Comments: Moves slowly and requires cueing for sequencing and initiation     Vision       Perception     Praxis      Cognition Arousal/Alertness: Awake/alert Behavior During Therapy: WFL for tasks assessed/performed Overall Cognitive Status: No family/caregiver present to determine baseline cognitive functioning Area of Impairment: Orientation;Attention;Memory;Following commands;Safety/judgement;Problem solving                 Orientation Level: Time Current Attention Level: Selective Memory: Decreased short-term memory Following Commands: Follows one step commands consistently;Follows one step commands with increased time Safety/Judgement: Decreased awareness of deficits;Decreased awareness of safety   Problem Solving: Slow processing;Decreased initiation;Difficulty sequencing;Requires verbal cues;Requires tactile cues General Comments: She is still a little foggy.  Slow processing and poor initiation        Exercises     Shoulder Instructions       General Comments  Pertinent Vitals/ Pain       Pain Assessment: No/denies pain  Home Living                                          Prior Functioning/Environment              Frequency  Min 3X/week        Progress Toward Goals  OT  Goals(current goals can now be found in the care plan section)  Progress towards OT goals: Progressing toward goals  Acute Rehab OT Goals Patient Stated Goal: go home with help of my daughters OT Goal Formulation: With patient Time For Goal Achievement: 09/10/19 Potential to Achieve Goals: Good  Plan Discharge plan remains appropriate    Co-evaluation                 AM-PAC OT "6 Clicks" Daily Activity     Outcome Measure   Help from another person eating meals?: A Little Help from another person taking care of personal grooming?: A Little Help from another person toileting, which includes using toliet, bedpan, or urinal?: A Little Help from another person bathing (including washing, rinsing, drying)?: A Little Help from another person to put on and taking off regular upper body clothing?: A Little Help from another person to put on and taking off regular lower body clothing?: A Little 6 Click Score: 18    End of Session Equipment Utilized During Treatment: Rolling walker;Gait belt  OT Visit Diagnosis: Unsteadiness on feet (R26.81);Muscle weakness (generalized) (M62.81);History of falling (Z91.81);Other symptoms and signs involving cognitive function   Activity Tolerance Patient tolerated treatment well   Patient Left in chair;with call bell/phone within reach;with chair alarm set   Nurse Communication Mobility status        Time: 9150-4136 OT Time Calculation (min): 32 min  Charges: OT General Charges $OT Visit: 1 Visit OT Treatments $Self Care/Home Management : 23-37 mins  Crystal Cooper, OTR/L    Crystal Cooper 08/28/2019, 12:36 PM

## 2019-08-28 NOTE — Progress Notes (Signed)
STROKE TEAM PROGRESS NOTE   INTERVAL HISTORY I have personally reviewed history of presenting illness with the patient and her daughter whom I spoke to over the phone, reviewed electronic medical records and imaging films in PACS.  The patient was found on the floor and could not explain what happened.  She had no focal neurological symptoms upon arrival but MRI scan does show patchy right MCA branch infarct.  Carotid ultrasound is unremarkable.  Hemoglobin A1c 6.3.  LDL cholesterol is 83 mg percent.  Echocardiogram done on 07/06/2019 was normal.  Patient has history of atrial fibrillation and was on Eliquis which was discontinued twice because of recurrent anemia.  She had previous endoscopy which had shown angiodysplasia. Vitals:   08/27/19 2047 08/27/19 2121 08/28/19 0709 08/28/19 0808  BP:  (!) 126/54  119/90  Pulse:  74  62  Resp:  16  20  Temp:  98.5 F (36.9 C)  97.8 F (36.6 C)  TempSrc:      SpO2: 97% 98% 95% 96%  Weight:      Height:        CBC:  Recent Labs  Lab 08/27/19 0117 08/28/19 0112  WBC 7.3 5.6  HGB 11.5* 10.6*  HCT 34.5* 32.4*  MCV 96.4 97.3  PLT 190 374    Basic Metabolic Panel:  Recent Labs  Lab 08/27/19 0117 08/28/19 0112  NA 137 137  K 3.1* 4.0  CL 99 99  CO2 25 29  GLUCOSE 143* 151*  BUN 24* 24*  CREATININE 1.46* 1.33*  CALCIUM 8.4* 8.6*  MG 1.8 2.0   Lipid Panel:     Component Value Date/Time   CHOL 159 08/27/2019 0117   CHOL 146 11/11/2018 1557   TRIG 60 08/27/2019 0117   HDL 64 08/27/2019 0117   HDL 70 11/11/2018 1557   CHOLHDL 2.5 08/27/2019 0117   VLDL 12 08/27/2019 0117   LDLCALC 83 08/27/2019 0117   LDLCALC 50 11/11/2018 1557   HgbA1c:  Lab Results  Component Value Date   HGBA1C 5.3 08/27/2019   Urine Drug Screen: No results found for: LABOPIA, COCAINSCRNUR, LABBENZ, AMPHETMU, THCU, LABBARB  Alcohol Level No results found for: ETH  IMAGING past 24 hours VAS US CAROTID  Result Date: 08/27/2019 Carotid Arterial Duplex  Study Indications:       CVA. Risk Factors:      Hypertension. Limitations        Today's exam was limited due to the body habitus of the                    patient and patient anatomy. Comparison Study:  No prior studies. Performing Technologist: Oliver Hum RVT  Examination Guidelines: A complete evaluation includes B-mode imaging, spectral Doppler, color Doppler, and power Doppler as needed of all accessible portions of each vessel. Bilateral testing is considered an integral part of a complete examination. Limited examinations for reoccurring indications may be performed as noted.  Right Carotid Findings: +----------+--------+--------+--------+-----------------------+--------+           PSV cm/sEDV cm/sStenosisPlaque Description     Comments +----------+--------+--------+--------+-----------------------+--------+ CCA Prox  52      7               smooth and heterogenoustortuous +----------+--------+--------+--------+-----------------------+--------+ CCA Distal49      8               smooth and heterogenous         +----------+--------+--------+--------+-----------------------+--------+ ICA Prox  76  13              calcific and irregular          +----------+--------+--------+--------+-----------------------+--------+ ICA Distal52      10                                     tortuous +----------+--------+--------+--------+-----------------------+--------+ ECA       56                                                      +----------+--------+--------+--------+-----------------------+--------+ +----------+--------+-------+--------+-------------------+           PSV cm/sEDV cmsDescribeArm Pressure (mmHG) +----------+--------+-------+--------+-------------------+ [id number]                                         +----------+--------+-------+--------+-------------------+ +---------+--------+--+--------+-+---------+ VertebralPSV cm/s28EDV  cm/s4Antegrade +---------+--------+--+--------+-+---------+  Left Carotid Findings: +----------+--------+--------+--------+-----------------------+--------+           PSV cm/sEDV cm/sStenosisPlaque Description     Comments +----------+--------+--------+--------+-----------------------+--------+ CCA Prox  62      9               smooth and heterogenoustortuous +----------+--------+--------+--------+-----------------------+--------+ CCA Distal53      11              smooth and heterogenous         +----------+--------+--------+--------+-----------------------+--------+ ICA Prox  43      9               calcific and irregular          +----------+--------+--------+--------+-----------------------+--------+ ICA Distal49      11                                     tortuous +----------+--------+--------+--------+-----------------------+--------+ ECA       63      0                                               +----------+--------+--------+--------+-----------------------+--------+ +----------+--------+--------+--------+-------------------+           PSV cm/sEDV cm/sDescribeArm Pressure (mmHG) +----------+--------+--------+--------+-------------------+ [id number]                                          +----------+--------+--------+--------+-------------------+ +---------+--------+--+--------+--+---------+ VertebralPSV cm/s37EDV cm/s11Antegrade +---------+--------+--+--------+--+---------+   Summary: Right Carotid: Velocities in the right ICA are consistent with a 1-39% stenosis. Left Carotid: Velocities in the left ICA are consistent with a 1-39% stenosis. Vertebrals: Bilateral vertebral arteries demonstrate antegrade flow. *See table(s) above for measurements and observations.  Electronically signed by Servando Snare MD on 08/27/2019 at 1:13:43 PM.    Final     PHYSICAL EXAM Pleasant elderly Caucasian lady sitting comfortably in the chair.  Not in  distress.  She is hard of hearing. . Afebrile. Head is nontraumatic. Neck is supple without bruit.    Cardiac exam no murmur or gallop. Lungs  are clear to auscultation. Distal pulses are well felt. Neurological Exam ;  Awake  Alert oriented x 3. Normal speech and language.diminished attention, registration and recall.  Poor fund of knowledge.  Eye movements full without nystagmus.fundi were not visualized. Vision acuity and fields appear normal. Hearing is normal. Palatal movements are normal. Face symmetric. Tongue midline. Normal strength, tone, reflexes and coordination.  Diminished fine finger movements on the left.  Orbits right over left upper extremity.  Mild left grip weakness compared to the right.  Normal sensation. Gait deferred.  ASSESSMENT/PLAN [name] is a 86 y.o. female with history of  HTN, asthma, paroxysmal A. Fib on and off AC d/t GIB, aortic stenosis status post surgery, hearing loss, CHF with increased nasal drainage past 3 days presenting with confusion.   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 Old B lacunes and small vessel disease.  MRI  R thalamocapsular jxn infarct. Punctate infarcts: R insula, R parietal lobe, R occipital lobe. Moderate to advanced small vessel disease. Multiple old infarcts: basal ganglia, R thalamic, B cerebellar.  Carotid Doppler  B ICA 1-39% stenosis, VAs antegrade   2D Echo 06/2019 - EF 60-65%. No source of embolus. TAVR  LDL 83  HgbA1c 5.3  Heparin 5000 units sq tid ordered on admission for VTE prophylaxis - currently off  No antithrombotic prior to admission, now on No antithrombotic. Stroke recommends resumption of eliquis, low dose if needed. Discussed with dtr. Defer to GI.  Therapy recommendations:  HH PT, HH OT  Disposition:  pending   Recurrent BRBPR  Hx Iron deficiency anemia d/t small bowel AVM  GI consulted  FOBT + Stool   Consider  EGD, flex sign  Paroxysmal Atrial Fibrillation  Home anticoagulation:  not on currently   Eliquis recently stopped d/t recurrent GIB. This has been a recurrent issue for her  Dr. Leonie Man spoke w/ daughter who agrees to resuming Eliquis at this time along w/ PPI with blood replacement as needed   Resume eliquis per GI  Hypertension  Stable . BP goal normotensive  Hyperlipidemia  Home meds:  No statin   Hold statin given advanced age and near normal LDL  LDL 83, goal < 70  Continue statin at discharge  Other Stroke Risk Factors  Advanced age  Hx Congestive heart failure  Hx TAVR  SSS post pacer  Other Active Problems  BUL infiltrate concerning for PNA, aspiration vs persistent postnasal drip  [facility] day # 3 Patient has presented with right MCA embolic branch infarct likely secondary to atrial fibrillation and was off anticoagulation due to history of recurrent GI bleeding on Eliquis.  Patient remains at increased risk for recurrent strokes but anticoagulation would be risky given her risk of recurrent GI bleed from angiodysplasia.  I had a long discussion with the patient and spoke to her daughter also over the phone about risk benefit and recommend trial of lower dose Eliquis 2.5 twice daily along with PPI for GI protection.  Also GI consult to see if any more diagnostic studies are necessary.  Discussed with Dr. Reesa Chew.  Greater than 50% time during this 35-minute visit was spent in counseling and coordination of care and discussion with care team and answering questions Crystal Contras, MD To contact Stroke Continuity provider, please refer to [email or web address] After hours, contact General Neurology

## 2019-08-29 DIAGNOSIS — R195 Other fecal abnormalities: Secondary | ICD-10-CM

## 2019-08-29 LAB — BASIC METABOLIC PANEL
Anion gap: 9 (ref 5–15)
BUN: 23 mg/dL (ref 8–23)
CO2: 27 mmol/L (ref 22–32)
Calcium: 9 mg/dL (ref 8.9–10.3)
Chloride: 102 mmol/L (ref 98–111)
Creatinine, Ser: 1.18 mg/dL — ABNORMAL HIGH (ref 0.44–1.00)
GFR calc Af Amer: 47 mL/min — ABNORMAL LOW (ref >60)
GFR calc non Af Amer: 41 mL/min — ABNORMAL LOW (ref >60)
Glucose, Bld: 127 mg/dL — ABNORMAL HIGH (ref 70–99)
Potassium: 4.2 mmol/L (ref 3.5–5.1)
Sodium: 138 mmol/L (ref 135–145)

## 2019-08-29 LAB — CBC
HCT: 32.9 % — ABNORMAL LOW (ref 36.0–46.0)
Hemoglobin: 10.7 g/dL — ABNORMAL LOW (ref 12.0–15.0)
MCH: 31.8 pg (ref 26.0–34.0)
MCHC: 32.5 g/dL (ref 30.0–36.0)
MCV: 97.9 fL (ref 80.0–100.0)
Platelets: 155 10*3/uL (ref 150–400)
RBC: 3.36 MIL/uL — ABNORMAL LOW (ref 3.87–5.11)
RDW: 13.5 % (ref 11.5–15.5)
WBC: 5.9 10*3/uL (ref 4.0–10.5)
nRBC: 0 % (ref 0.0–0.2)

## 2019-08-29 LAB — MAGNESIUM: Magnesium: 2.2 mg/dL (ref 1.7–2.4)

## 2019-08-29 MED ORDER — APIXABAN 2.5 MG PO TABS
2.5000 mg | ORAL_TABLET | Freq: Two times a day (BID) | ORAL | Status: DC
  Administered 2019-08-29 – 2019-08-30 (×3): 2.5 mg via ORAL
  Filled 2019-08-29 (×3): qty 1

## 2019-08-29 NOTE — Progress Notes (Signed)
    Progress Note   Subjective  Feeling better today. No GI complaints. No evidence of overt GI bleeding.    Objective  Vital signs in last 24 hours: Temp:  [98.3 F (36.8 C)-98.9 F (37.2 C)] 98.9 F (37.2 C) (03/06 0838) Pulse Rate:  [60-68] 60 (03/06 0838) Resp:  [17-19] 19 (03/06 0838) BP: (119-149)/(52-63) 149/63 (03/06 0838) SpO2:  [95 %-98 %] 96 % (03/06 0922) Last BM Date: 08/29/19  General: Alert, well-developed, frail, elderly in NAD Heart:  Regular rate and rhythm; no murmurs Chest: Clear to ascultation bilaterally Abdomen:  Soft, nontender and nondistended. Normal bowel sounds, without guarding, and without rebound.   Extremities:  Without edema. Neurologic:  Alert and  oriented x4; grossly normal neurologically. Psych:  Alert and cooperative. Normal mood and affect.  Intake/Output from previous day: 03/05 0701 - 03/06 0700 In: 240 [P.O.:240] Out: -  Intake/Output this shift: Total I/O In: 240 [P.O.:240] Out: -   Lab Results: Recent Labs    08/27/19 0117 08/27/19 1639 08/28/19 0112 08/29/19 0338  WBC 7.3  --  5.6 5.9  HGB 11.5*  --  10.6* 10.7*  HCT 34.5* 31.9* 32.4* 32.9*  PLT 190  --  168 155   BMET Recent Labs    08/27/19 0117 08/28/19 0112 08/29/19 0338  NA 137 137 138  K 3.1* 4.0 4.2  CL 99 99 102  CO2 25 29 27   GLUCOSE 143* 151* 127*  BUN 24* 24* 23  CREATININE 1.46* 1.33* 1.18*  CALCIUM 8.4* 8.6* 9.0     Assessment & Recommendations   1. Occult blood in stool and anemia in setting of acute CVA. Please see yesterdays consult note. In addition maintain pantoprazole 40 mg po bid long term. Hgb stable at 10.7. Restarting Eliquis today. Monitor CBC and stools. Please contact us if evidence of worsening GI bleeding.  GI signing off. Outpatient GI follow up with Dr. Zenovia Jarred as needed.     LOS: 4 days   Norberto Sorenson T. Fuller Plan MD  08/29/2019, 4:23 PM

## 2019-08-29 NOTE — Progress Notes (Signed)
STROKE TEAM PROGRESS NOTE   INTERVAL HISTORY Patient is sitting up in bed she has no complaints.  She has been seen by gastroenterology who feel there is no need for any procedures at the present time and recommend cautious Eliquis with close follow-up of hematocrit. Vitals:   08/28/19 2057 08/28/19 2133 08/29/19 0838 08/29/19 0922  BP:  (!) 119/52 (!) 149/63   Pulse:  68 60   Resp:  17 19   Temp:  98.3 F (36.8 C) 98.9 F (37.2 C)   TempSrc:      SpO2: 98% 96% 95% 96%  Weight:      Height:        CBC:  Recent Labs  Lab 08/28/19 0112 08/29/19 0338  WBC 5.6 5.9  HGB 10.6* 10.7*  HCT 32.4* 32.9*  MCV 97.3 97.9  PLT 168 510    Basic Metabolic Panel:  Recent Labs  Lab 08/28/19 0112 08/29/19 0338  NA 137 138  K 4.0 4.2  CL 99 102  CO2 29 27  GLUCOSE 151* 127*  BUN 24* 23  CREATININE 1.33* 1.18*  CALCIUM 8.6* 9.0  MG 2.0 2.2   Lipid Panel:     Component Value Date/Time   CHOL 159 08/27/2019 0117   CHOL 146 11/11/2018 1557   TRIG 60 08/27/2019 0117   HDL 64 08/27/2019 0117   HDL 70 11/11/2018 1557   CHOLHDL 2.5 08/27/2019 0117   VLDL 12 08/27/2019 0117   LDLCALC 83 08/27/2019 0117   LDLCALC 50 11/11/2018 1557   HgbA1c:  Lab Results  Component Value Date   HGBA1C 5.3 08/27/2019   Urine Drug Screen: No results found for: LABOPIA, COCAINSCRNUR, LABBENZ, AMPHETMU, THCU, LABBARB  Alcohol Level No results found for: ETH  IMAGING past 24 hours No results found.  PHYSICAL EXAM Pleasant elderly Caucasian lady sitting comfortably in the chair.  Not in distress.  She is hard of hearing. . Afebrile. Head is nontraumatic. Neck is supple without bruit.    Cardiac exam no murmur or gallop. Lungs are clear to auscultation. Distal pulses are well felt. Neurological Exam ;  Awake  Alert oriented x 3. Normal speech and language.diminished attention, registration and recall.  Poor fund of knowledge.  Eye movements full without nystagmus.fundi were not visualized. Vision  acuity and fields appear normal. Hearing is normal. Palatal movements are normal. Face symmetric. Tongue midline. Normal strength, tone, reflexes and coordination.  Diminished fine finger movements on the left.  Orbits right over left upper extremity.  Mild left grip weakness compared to the right.  Normal sensation. Gait deferred.  ASSESSMENT/PLAN Ms. KYNLI CHOU is a 86 y.o. female with history of HTN, asthma, paroxysmal A. Fib on and off AC d/t GIB, aortic stenosis status post surgery, hearing loss, CHF with increased nasal drainage past 3 days presenting with confusion.   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 Old B lacunes and small vessel disease.  MRI  R thalamocapsular jxn infarct. Punctate infarcts: R insula, R parietal lobe, R occipital lobe. Moderate to advanced small vessel disease. Multiple old infarcts: basal ganglia, R thalamic, B cerebellar.  Carotid Doppler  B ICA 1-39% stenosis, VAs antegrade   2D Echo 06/2019 - EF 60-65%. No source of embolus. TAVR  LDL 83  HgbA1c 5.3  Heparin 5000 units sq tid ordered on admission for VTE prophylaxis - currently off  No antithrombotic prior to admission,  now on No antithrombotic. Stroke recommends resumption of eliquis, low dose if needed. Discussed with dtr. Defer to GI. Now back on Eliquis.  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 This has been a recurrent issue for her  Dr. Leonie Man spoke w/ daughter who agrees to resuming Eliquis at this time along w/ PPI with blood replacement as needed   Resume eliquis per GI - Now back on Eliquis. Hb 10.6->10.7  Hypertension  Stable . BP goal  normotensive  Hyperlipidemia  Home meds:  No statin   Hold statin given advanced age and near normal LDL  LDL 83, goal < 70  Continue statin at discharge  Other Stroke Risk Factors  Advanced age  Hx Congestive heart failure  Hx TAVR  SSS post pacer  Other Active Problems  BUL infiltrate concerning for PNA, aspiration vs persistent postnasal drip - WBC's 5.9 (afebrile)  Presyncope   Hospital day # 4 Patient has presented with right MCA embolic branch infarct likely secondary to atrial fibrillation and was off anticoagulation due to history of recurrent GI bleeding on Eliquis.  Patient remains at increased risk for recurrent strokes but anticoagulation would be risky given her risk of recurrent GI bleed from angiodysplasia.  I had a long discussion with the patient and spoke to her daughter also yesterday over the phone about risk benefit and recommend trial of lower dose Eliquis 2.5 twice daily along with PPI for GI protection.  GI is also in agreement with this plan..  Discussed with Dr. Reesa Chew.  Recommend close follow-up with primary physician and GI to assess bleeding .stroke team will sign off.  Follow-up as an outpatient with stroke clinic.    Antony Contras, MD

## 2019-08-29 NOTE — Progress Notes (Signed)
Occupational Therapy Treatment Patient Details Name: Crystal Cooper MRN: [id number] DOB: 05-Jun-1927 Today's Date: 08/29/2019    History of present illness 86 y.o. female admitted to ED 3/2 after being found on floor by family at home, recent admission 2/28-3/1 for angioedema. Current medical diagnoses of bacterial PNA, AMS.  PMH includes hypertension, reactive airway disease, paroxysmal atrial fibrillation, aortic stenosis s/p TAVR, pacemaker, osteoporosis, hearing loss, diastolic dysfunction CHF.   OT comments  Patient supine in bed on arrival with daughter in room.  Today she seemed more clear cognitively, though still processing slower than baseline daughter reports.  Patient only required min cues for initiation and sequencing today, where previosuly she needed direction with each step and multiple cues to begin the step.  Able to transfer and walk to sink with min guard and stand at sink with no support.  Groomed at sink with supervision.  Educated on proper walker use and advised patient and daughter to start using walker regularly opposed to cane.  Will continue to follow with OT acutely to address the deficits listed below.    Follow Up Recommendations  Home health OT;Supervision/Assistance - 24 hour    Equipment Recommendations  None recommended by OT    Recommendations for Other Services      Precautions / Restrictions Precautions Precautions: Fall Restrictions Weight Bearing Restrictions: No       Mobility Bed Mobility Overal bed mobility: Needs Assistance Bed Mobility: Supine to Sit     Supine to sit: Min guard        Transfers Overall transfer level: Needs assistance Equipment used: Rolling walker (2 wheeled) Transfers: Sit to/from Stand Sit to Stand: Min guard              Balance Overall balance assessment: Needs assistance;History of Falls Sitting-balance support: No upper extremity supported;Feet supported Sitting balance-Leahy Scale: Fair      Standing balance support: Bilateral upper extremity supported;During functional activity Standing balance-Leahy Scale: Fair Standing balance comment: Standing grooming without support. Walker with mobility                           ADL either performed or assessed with clinical judgement   ADL Overall ADL's : Needs assistance/impaired Eating/Feeding: Set up;Sitting   Grooming: Wash/dry hands;Wash/dry face;Oral care;Supervision/safety;Standing                               Functional mobility during ADLs: Min guard;Rolling walker General ADL Comments: Moves slowly     Vision       Perception     Praxis      Cognition Arousal/Alertness: Awake/alert Behavior During Therapy: WFL for tasks assessed/performed Overall Cognitive Status: Impaired/Different from baseline Area of Impairment: Attention;Memory;Following commands                 Orientation Level: Time Current Attention Level: Alternating Memory: Decreased short-term memory Following Commands: Follows one step commands with increased time;Follows one step commands consistently Safety/Judgement: Decreased awareness of deficits;Decreased awareness of safety   Problem Solving: Slow processing;Difficulty sequencing;Decreased initiation General Comments: She wasimproved since previous visits andsequencing/initiating better        Exercises     Shoulder Instructions       General Comments      Pertinent Vitals/ Pain       Pain Assessment: No/denies pain  Home Living  Prior Functioning/Environment              Frequency  Min 3X/week        Progress Toward Goals  OT Goals(current goals can now be found in the care plan section)  Progress towards OT goals: Progressing toward goals  Acute Rehab OT Goals Patient Stated Goal: Go home with help of daughters OT Goal Formulation: With patient Time For Goal  Achievement: 09/10/19 Potential to Achieve Goals: Good  Plan Discharge plan needs to be updated    Co-evaluation                 AM-PAC OT "6 Clicks" Daily Activity     Outcome Measure   Help from another person eating meals?: None Help from another person taking care of personal grooming?: A Little Help from another person toileting, which includes using toliet, bedpan, or urinal?: A Little Help from another person bathing (including washing, rinsing, drying)?: A Little Help from another person to put on and taking off regular upper body clothing?: A Little Help from another person to put on and taking off regular lower body clothing?: A Little 6 Click Score: 19    End of Session Equipment Utilized During Treatment: Rolling walker;Gait belt  OT Visit Diagnosis: Unsteadiness on feet (R26.81);Muscle weakness (generalized) (M62.81);History of falling (Z91.81);Other symptoms and signs involving cognitive function   Activity Tolerance Patient tolerated treatment well   Patient Left in chair;with call bell/phone within reach;with chair alarm set;with family/visitor present   Nurse Communication Mobility status        Time: 1696-7893 OT Time Calculation (min): 24 min  Charges: OT General Charges $OT Visit: 1 Visit OT Treatments $Self Care/Home Management : 23-37 mins  August Luz, OTR/L    Phylliss Bob 08/29/2019, 12:55 PM

## 2019-08-29 NOTE — Progress Notes (Signed)
PROGRESS NOTE    Crystal Cooper  YKD:[id number] DOB: [birth year] DOA: 08/25/2019 PCP: Baruch Gouty, FNP   Brief Narrative:  86 year old with history of HTN, asthma, paroxysmal A. fib, aortic stenosis status post surgery, hearing loss, CHF presented with altered mental status.  Recently admitted to the hospital for angioedema secondary to ACE inhibitor which was discontinued.  Reports of copious amount of runny nose for the past 3 weeks.  Upon admission chest x-ray showed bilateral upper lobe infiltrates suspect pneumonia and neurology team was consulted.  MRI was positive for acute/subacute multifocal CVA therefore neurology team consulted.  Assessment & Plan:   Principal Problem:   Acute CVA (cerebrovascular accident) ([place]) Active Problems:   Essential hypertension   PAF (paroxysmal atrial fibrillation) ([facility])   Asthma   S/P TAVR (transcatheter aortic valve replacement)   Diastolic dysfunction   CKD (chronic kidney disease), stage III   PNA (pneumonia)  Acute multifocal CVA likely secondary to atrial fibrillation, not on anticoagulation -MRI brain-consistent with multifocal CVA.  Stroke team consulted -Initial CT head-negative for acute pathology except chronic infarcts. -LDL 83, A1c 5.3 -Carotid Dopplers-1-39% stenosis bilaterally -Supportive care.  PT/OT-home health -Extensively discussed with Cardiology, Neurology and GI. Will start Eliquis 2.5mg  BID and monitor for any Hb drop.    Bright red blood per rectum -Hemoccult positive, seen by GI.  This is subsided for now.  Okay to start low-dose Eliquis and monitoring the hospital.  PPI twice daily  Bilateral upper lobe infiltrate concerning for pneumonia Aspiration/persistent postnasal drip -COVID-19 negative.  Procalcitonin negative.  Will stop antibiotics -BNP mildly elevated.  Hold off on further diuresis -Bronchodilators, incentive spirometer and flutter valve -Speech and swallow -mild aspiration risk, regular thin liquid  okay. -Strep pneumo antigen, Legionella-negative  Presyncope- she has some autonomic orthostasis.  History of biosynthetic aortic valve status post TAVR  History of paroxysmal atrial fibrillation Sick sinus syndrome status post pacemaker -Safety of resumption for Eliquis will be discussed with GI.  Briefly discussed with Dr. Stanford Breed -Metoprolol 25 mg twice daily  Essential hypertension -Metoprolol twice daily  DVT prophylaxis: Low-dose Eliquis Code Status: Full code Family Communication: Spoke extensively with the patient's daughter [name] regarding her care. Disposition Plan:   Patient From= home  Patient Anticipated D/C place= to be determined  Barriers= Eliquis started today, will have to closely monitor.  She is at a very high risk of recurrent GI bleeding.  Discussed with GI neurology, if does not bleed over next 24 hours and will discharge tomorrow with home health.  Subjective: No further episodes of bleeding overnight, this morning she is feeling okay.  Review of Systems Otherwise negative except as per HPI, including: General = no fevers, chills, dizziness,  fatigue HEENT/EYES = negative for loss of vision, double vision, blurred vision,  sore throa Cardiovascular= negative for chest pain, palpitation Respiratory/lungs= negative for shortness of breath, cough, wheezing; hemoptysis,  Gastrointestinal= negative for nausea, vomiting, abdominal pain Genitourinary= negative for Dysuria MSK = Negative for arthralgia, myalgias Neurology= Negative for headache, numbness, tingling  Psychiatry= Negative for suicidal and homocidal ideation Skin= Negative for Rash   Examination:  Constitutional: Not in acute distress Respiratory: Clear to auscultation bilaterally Cardiovascular: Normal sinus rhythm, no rubs Abdomen: Nontender nondistended good bowel sounds Musculoskeletal: No edema noted Skin: No rashes seen Neurologic: CN 2-12 grossly intact.  And nonfocal Psychiatric:  Normal judgment and insight. Alert and oriented x 3. Normal mood.    Objective: Vitals:   08/28/19 2057 08/28/19 2133 08/29/19  4097 08/29/19 0922  BP:  (!) 119/52 (!) 149/63   Pulse:  68 60   Resp:  17 19   Temp:  98.3 F (36.8 C) 98.9 F (37.2 C)   TempSrc:      SpO2: 98% 96% 95% 96%  Weight:      Height:        Intake/Output Summary (Last 24 hours) at 08/29/2019 1025 Last data filed at 08/29/2019 0915 Gross per 24 hour  Intake 240 ml  Output --  Net 240 ml   Filed Weights   08/25/19 1553  Weight: 49.4 kg     Data Reviewed:   CBC: Recent Labs  Lab 08/24/19 0321 08/24/19 0321 08/25/19 1525 08/27/19 0117 08/27/19 1639 08/28/19 0112 08/29/19 0338  WBC 6.3  --  9.9 7.3  --  5.6 5.9  HGB 10.9*  --  12.3 11.5*  --  10.6* 10.7*  HCT 32.6*   < > 37.4 34.5* 31.9* 32.4* 32.9*  MCV 95.6  --  96.4 96.4  --  97.3 97.9  PLT 172  --  226 190  --  168 155   < > = values in this interval not displayed.   Basic Metabolic Panel: Recent Labs  Lab 08/24/19 0321 08/25/19 1525 08/27/19 0117 08/28/19 0112 08/29/19 0338  NA 134* 136 137 137 138  K 3.2* 3.7 3.1* 4.0 4.2  CL 96* 99 99 99 102  CO2 29 25 25 29 27   GLUCOSE 158* 155* 143* 151* 127*  BUN 15 24* 24* 24* 23  CREATININE 1.07* 1.07* 1.46* 1.33* 1.18*  CALCIUM 8.8* 9.1 8.4* 8.6* 9.0  MG  --   --  1.8 2.0 2.2   GFR: Estimated Creatinine Clearance: 24.7 mL/min (A) (by C-G formula based on SCr of 1.18 mg/dL (H)). Liver Function Tests: Recent Labs  Lab 08/24/19 0321 08/25/19 1525  AST 26 43*  ALT 20 26  ALKPHOS 46 56  BILITOT 1.6* 1.1  PROT 6.1* 6.7  ALBUMIN 3.2* 3.5   No results for input(s): LIPASE, AMYLASE in the last 168 hours. No results for input(s): AMMONIA in the last 168 hours. Coagulation Profile: No results for input(s): INR, PROTIME in the last 168 hours. Cardiac Enzymes: Recent Labs  Lab 08/25/19 1525  CKTOTAL 263*   BNP (last 3 results) No results for input(s): PROBNP in the last 8760  hours. HbA1C: Recent Labs    08/27/19 0117  HGBA1C 5.3   CBG: Recent Labs  Lab 08/24/19 1138 08/25/19 1628  GLUCAP 115* 109*   Lipid Profile: Recent Labs    08/27/19 0117  CHOL 159  HDL 64  LDLCALC 83  TRIG 60  CHOLHDL 2.5   Thyroid Function Tests: No results for input(s): TSH, T4TOTAL, FREET4, T3FREE, THYROIDAB in the last 72 hours. Anemia Panel: Recent Labs    08/27/19 1639  VITAMINB12 1,497*  FERRITIN 55  TIBC 263  IRON 57   Sepsis Labs: Recent Labs  Lab 08/25/19 1842 08/26/19 0652 08/27/19 0117  PROCALCITON <0.10 <0.10 <0.10    Recent Results (from the past 240 hour(s))  SARS CORONAVIRUS 2 (TAT 6-24 HRS) Nasopharyngeal Nasopharyngeal Swab     Status: None   Collection Time: 08/23/19  7:45 PM   Specimen: Nasopharyngeal Swab  Result Value Ref Range Status   SARS Coronavirus 2 NEGATIVE NEGATIVE Final    Comment: (NOTE) SARS-CoV-2 target nucleic acids are NOT DETECTED. The SARS-CoV-2 RNA is generally detectable in upper and lower respiratory specimens during the acute  phase of infection. Negative results do not preclude SARS-CoV-2 infection, do not rule out co-infections with other pathogens, and should not be used as the sole basis for treatment or other patient management decisions. Negative results must be combined with clinical observations, patient history, and epidemiological information. The expected result is Negative. Fact Sheet for Patients: SugarRoll.be Fact Sheet for Healthcare Providers: https://www.woods-mathews.com/ This test is not yet approved or cleared by the [place] FDA and  has been authorized for detection and/or diagnosis of SARS-CoV-2 by FDA under an Emergency Use Authorization (EUA). This EUA will remain  in effect (meaning this test can be used) for the duration of the COVID-19 declaration under Section 56 4(b)(1) of the Act, 21 U.S.C. section 360bbb-3(b)(1), unless the  authorization is terminated or revoked sooner. Performed at [facility], [place] [address], [place], [place] [address]   SARS CORONAVIRUS 2 (TAT 6-24 HRS) Nasopharyngeal Nasopharyngeal Swab     Status: None   Collection Time: 08/25/19  6:04 PM   Specimen: Nasopharyngeal Swab  Result Value Ref Range Status   SARS Coronavirus 2 NEGATIVE NEGATIVE Final    Comment: (NOTE) SARS-CoV-2 target nucleic acids are NOT DETECTED. The SARS-CoV-2 RNA is generally detectable in upper and lower respiratory specimens during the acute phase of infection. Negative results do not preclude SARS-CoV-2 infection, do not rule out co-infections with other pathogens, and should not be used as the sole basis for treatment or other patient management decisions. Negative results must be combined with clinical observations, patient history, and epidemiological information. The expected result is Negative. Fact Sheet for Patients: SugarRoll.be Fact Sheet for Healthcare Providers: https://www.woods-mathews.com/ This test is not yet approved or cleared by the [place] FDA and  has been authorized for detection and/or diagnosis of SARS-CoV-2 by FDA under an Emergency Use Authorization (EUA). This EUA will remain  in effect (meaning this test can be used) for the duration of the COVID-19 declaration under Section 56 4(b)(1) of the Act, 21 U.S.C. section 360bbb-3(b)(1), unless the authorization is terminated or revoked sooner. Performed at [facility], [place] [address] [place], [place] [address]   Blood culture (routine x 2)     Status: None (Preliminary result)   Collection Time: 08/25/19  6:06 PM   Specimen: BLOOD RIGHT FOREARM  Result Value Ref Range Status   Specimen Description BLOOD RIGHT FOREARM  Final   Special Requests   Final    BOTTLES DRAWN AEROBIC AND ANAEROBIC Blood Culture results may not be optimal due to an inadequate volume of blood received  in culture bottles   Culture   Final    NO GROWTH 3 DAYS Performed at [facility], [place] [address] [place], [place] [address]    Report Status PENDING  Incomplete  Blood culture (routine x 2)     Status: None (Preliminary result)   Collection Time: 08/25/19  6:06 PM   Specimen: BLOOD LEFT HAND  Result Value Ref Range Status   Specimen Description BLOOD LEFT HAND  Final   Special Requests   Final    BOTTLES DRAWN AEROBIC AND ANAEROBIC Blood Culture results may not be optimal due to an inadequate volume of blood received in culture bottles   Culture   Final    NO GROWTH 3 DAYS Performed at [facility], [place] [address] [place], [place] [address]    Report Status PENDING  Incomplete         Radiology Studies: VAS US  CAROTID  Result Date: 08/27/2019 Carotid Arterial Duplex Study Indications:       CVA. Risk Factors:      Hypertension. Limitations        Today's exam was limited due to the body habitus of the                    patient and patient anatomy. Comparison Study:  No prior studies. Performing Technologist: Oliver Hum RVT  Examination Guidelines: A complete evaluation includes B-mode imaging, spectral Doppler, color Doppler, and power Doppler as needed of all accessible portions of each vessel. Bilateral testing is considered an integral part of a complete examination. Limited examinations for reoccurring indications may be performed as noted.  Right Carotid Findings: +----------+--------+--------+--------+-----------------------+--------+             PSV cm/s EDV cm/s Stenosis Plaque Description      Comments  +----------+--------+--------+--------+-----------------------+--------+  CCA Prox   52       7                 smooth and heterogenous tortuous  +----------+--------+--------+--------+-----------------------+--------+  CCA Distal 49       8                 smooth and heterogenous            +----------+--------+--------+--------+-----------------------+--------+  ICA Prox   76       13                calcific and irregular            +----------+--------+--------+--------+-----------------------+--------+  ICA Distal 52       10                                        tortuous  +----------+--------+--------+--------+-----------------------+--------+  ECA        56                                                           +----------+--------+--------+--------+-----------------------+--------+ +----------+--------+-------+--------+-------------------+             PSV cm/s EDV cms Describe Arm Pressure (mmHG)  +----------+--------+-------+--------+-------------------+  Subclavian 86                                             +----------+--------+-------+--------+-------------------+ +---------+--------+--+--------+-+---------+  Vertebral PSV cm/s 28 EDV cm/s 4 Antegrade  +---------+--------+--+--------+-+---------+  Left Carotid Findings: +----------+--------+--------+--------+-----------------------+--------+             PSV cm/s EDV cm/s Stenosis Plaque Description      Comments  +----------+--------+--------+--------+-----------------------+--------+  CCA Prox   62       9                 smooth and heterogenous tortuous  +----------+--------+--------+--------+-----------------------+--------+  CCA Distal 53       11                smooth and heterogenous           +----------+--------+--------+--------+-----------------------+--------+  ICA Prox   43  9                 calcific and irregular            +----------+--------+--------+--------+-----------------------+--------+  ICA Distal 49       11                                        tortuous  +----------+--------+--------+--------+-----------------------+--------+  ECA        63       0                                                   +----------+--------+--------+--------+-----------------------+--------+  +----------+--------+--------+--------+-------------------+             PSV cm/s EDV cm/s Describe Arm Pressure (mmHG)  +----------+--------+--------+--------+-------------------+  Subclavian 96                                              +----------+--------+--------+--------+-------------------+ +---------+--------+--+--------+--+---------+  Vertebral PSV cm/s 37 EDV cm/s 11 Antegrade  +---------+--------+--+--------+--+---------+   Summary: Right Carotid: Velocities in the right ICA are consistent with a 1-39% stenosis. Left Carotid: Velocities in the left ICA are consistent with a 1-39% stenosis. Vertebrals: Bilateral vertebral arteries demonstrate antegrade flow. *See table(s) above for measurements and observations.  Electronically signed by Servando Snare MD on 08/27/2019 at 1:13:43 PM.    Final    Scheduled Meds:  apixaban  2.5 mg Oral BID   calcium-vitamin D  1 tablet Oral Daily   ferrous sulfate  325 mg Oral BID WC   furosemide  40 mg Intravenous Once   ipratropium-albuterol  3 mL Nebulization BID   metoprolol tartrate  25 mg Oral BID   multivitamin with minerals  1 tablet Oral Daily   pantoprazole  40 mg Oral BID AC   Continuous Infusions:   LOS: 4 days   Time spent= 35 mins  Lilianah Buffin Arsenio Loader, MD [facility]  If 7PM-7AM, please contact night-coverage  08/29/2019, 10:25 AM

## 2019-08-30 LAB — CULTURE, BLOOD (ROUTINE X 2)
Culture: NO GROWTH
Culture: NO GROWTH

## 2019-08-30 LAB — BASIC METABOLIC PANEL
Anion gap: 9 (ref 5–15)
BUN: 22 mg/dL (ref 8–23)
CO2: 25 mmol/L (ref 22–32)
Calcium: 8.8 mg/dL — ABNORMAL LOW (ref 8.9–10.3)
Chloride: 101 mmol/L (ref 98–111)
Creatinine, Ser: 1.16 mg/dL — ABNORMAL HIGH (ref 0.44–1.00)
GFR calc Af Amer: 48 mL/min — ABNORMAL LOW (ref >60)
GFR calc non Af Amer: 41 mL/min — ABNORMAL LOW (ref >60)
Glucose, Bld: 116 mg/dL — ABNORMAL HIGH (ref 70–99)
Potassium: 5.2 mmol/L — ABNORMAL HIGH (ref 3.5–5.1)
Sodium: 135 mmol/L (ref 135–145)

## 2019-08-30 LAB — CBC
HCT: 32.6 % — ABNORMAL LOW (ref 36.0–46.0)
Hemoglobin: 10.6 g/dL — ABNORMAL LOW (ref 12.0–15.0)
MCH: 31.6 pg (ref 26.0–34.0)
MCHC: 32.5 g/dL (ref 30.0–36.0)
MCV: 97.3 fL (ref 80.0–100.0)
Platelets: 153 10*3/uL (ref 150–400)
RBC: 3.35 MIL/uL — ABNORMAL LOW (ref 3.87–5.11)
RDW: 13.5 % (ref 11.5–15.5)
WBC: 6.7 10*3/uL (ref 4.0–10.5)
nRBC: 0 % (ref 0.0–0.2)

## 2019-08-30 LAB — MAGNESIUM: Magnesium: 2.2 mg/dL (ref 1.7–2.4)

## 2019-08-30 MED ORDER — APIXABAN 2.5 MG PO TABS: 2.5000 mg | ORAL_TABLET | Freq: Two times a day (BID) | ORAL | 0 refills | Status: DC

## 2019-08-30 MED ORDER — PANTOPRAZOLE SODIUM 40 MG PO TBEC: 40.0000 mg | DELAYED_RELEASE_TABLET | Freq: Two times a day (BID) | ORAL | 0 refills | Status: DC

## 2019-08-30 NOTE — TOC Transition Note (Addendum)
Transition of Care [facility]) - CM/SW Discharge Note   Patient Details  Name: RENEZMAE CANLAS MRN: [id number] Date of Birth: [birth year]  [facility]) CM/SW Contact:  Carles Collet, RN Phone Number: 08/30/2019, 8:27 AM   Clinical Narrative:    Kaylyn Layer that patient will DC today. Alvis Lemmings has been in contact with daughter [name] throughout the day yesterday. 30 day Eliquis card taken to room No other CM needs identified.        Barriers to Discharge: Continued Medical Work up   Patient Goals and CMS Choice Patient states their goals for this hospitalization and ongoing recovery are:: to go home CMS Medicare.gov Compare Post Acute Care list provided to:: Patient Choice offered to / list presented to : Patient, Adult Children  Discharge Placement                       Discharge Plan and Services   Discharge Planning Services: CM Consult Post Acute Care Choice: Home Health                    HH Arranged: RN, PT, OT, Nurse's Aide [place] Agency: [place] Date [facility] Agency Contacted: 08/26/19 Time [place]: 1232 Representative spoke with at [place]: Tommi Rumps (requested home first program)  Social Determinants of Health (SDOH) Interventions     Readmission Risk Interventions No flowsheet data found.

## 2019-08-30 NOTE — Progress Notes (Signed)
Patient is discharged and waiting on daughter to arrive to transport her home.

## 2019-08-30 NOTE — Discharge Instructions (Addendum)
Hospital Discharge After a Stroke  Being discharged from the hospital after a stroke can feel overwhelming. Many things may be different, and it is normal to feel scared or anxious. Some stroke survivors may be able to return to their homes, and others may need more specialized care on a temporary or permanent basis. Your stroke care team will work with you to develop a discharge plan that is best for you. Ask questions if you do not understand something. Invite a friend or family member to participate in discharge planning. Understanding and following your discharge plan can help to prevent another stroke or other problems. Understanding your medicines After a stroke, your health care provider may prescribe one or more types of medicine. It is important to take medicines exactly as told by your health care provider. Serious harm, such as another stroke, can happen if you are unable to take your medicine exactly as prescribed. Make sure you understand:  What medicine to take.  Why you are taking the medicine.  How and when to take it.  If it can be taken with your other medicines and herbal supplements.  Possible side effects.  When to call your health care provider if you have any side effects.  How you will get and pay for your medicines. Medical assistance programs may be able to help you pay for prescription medicines if you cannot afford them. If you are taking an anticoagulant, be sure to take it exactly as told by your health care provider. This type of medicine can increase the risk of bleeding because it works to prevent blood from clotting. You may need to take certain precautions to prevent bleeding. You should contact your health care provider if you have:  Bleeding or bruising.  A fall or other injury to your head.  Blood in your urine or stool (feces). Planning for home safety  Take steps to prevent falls, such as installing grab bars or using a shower chair. Ask a friend or  family member to get needed things in place before you go home if possible. A therapist can come to your home to make recommendations for safety equipment. Ask your health care provider if you would benefit from this service or from home care. Getting needed equipment Ask your health care provider for a list of any medical equipment and supplies you will need at home. These may include items such as:  Walkers.  Canes.  Wheelchairs.  Hand-strengthening devices.  Special eating utensils. Medical equipment can be rented or purchased, depending on your insurance coverage. Check with your insurance company about what is covered. Keeping follow-up visits After a stroke, you will need to follow up regularly with a health care provider. You may also need rehabilitation, which can include physical therapy, occupational therapy, or speech-language therapy. Keeping these appointments is very important to your recovery after a stroke. Be sure to bring your medicine list and discharge papers with you to your appointments. If you need help to keep track of your schedule, use a calendar or appointment reminder. Preventing another stroke Having a stroke puts you at risk for another stroke in the future. Ask your health care provider what actions you can take to lower the risk. These may include:  Increasing how much you exercise.  Making a healthy eating plan.  Quitting smoking.  Managing other health conditions, such as high blood pressure, high cholesterol, or diabetes.  Limiting alcohol use. Knowing the warning signs of a stroke  Make sure  you understand the signs of a stroke. Before you leave the hospital, you will receive information outlining the stroke warning signs. Share these with your friends and family members. "BE FAST" is an easy way to remember the main warning signs of a stroke:  B - Balance. Signs are dizziness, sudden trouble walking, or loss of balance.  E - Eyes. Signs are  trouble seeing or a sudden change in vision.  F - Face. Signs are sudden weakness or numbness of the face, or the face or eyelid drooping on one side.  A - Arms. Signs are weakness or numbness in an arm. This happens suddenly and usually on one side of the body.  S - Speech. Signs are sudden trouble speaking, slurred speech, or trouble understanding what people say.  T - Time. Time to call emergency services. Write down what time symptoms started. Other signs of stroke may include:  A sudden, severe headache with no known cause.  Nausea or vomiting.  Seizure. These symptoms may represent a serious problem that is an emergency. Do not wait to see if the symptoms will go away. Get medical help right away. Call your local emergency services (911 in the [place] Do not drive yourself to the hospital. Make note of the time that you had your first symptoms. Your emergency responders or emergency room staff will need to know this information. Summary  Being discharged from the hospital after a stroke can feel overwhelming. It is normal to feel scared or anxious.  Make sure you take medicines exactly as told by your health care provider.  Know the warning signs of a stroke, and get help right way if you have any of these symptoms. "BE FAST" is an easy way to remember the main warning signs of a stroke. This information is not intended to replace advice given to you by your health care provider. Make sure you discuss any questions you have with your health care provider. Document Revised: 03/04/2019 Document Reviewed: 09/14/2016 Elsevier Patient Education  [place].   Gastrointestinal Bleeding Gastrointestinal (GI) bleeding is bleeding somewhere along the path that food travels through the body (digestive tract). This path is anywhere between the mouth and the opening of the butt (anus). You may have blood in your poop (stool) or have black poop. If you throw up (vomit), there may be  blood in it. This condition can be mild, serious, or even life-threatening. If you have a lot of bleeding, you may need to stay in the hospital. What are the causes? This condition may be caused by:  Irritation and swelling of the esophagus (esophagitis). The esophagus is part of the body that moves food from your mouth to your stomach.  Swollen veins in the butt (hemorrhoids).  Areas of painful tearing in the opening of the butt (anal fissures). These are often caused by passing hard poop.  Pouches that form on the colon over time (diverticulosis).  Irritation and swelling (diverticulitis) in areas where pouches have formed on the colon.  Growths (polyps) or cancer. Colon cancer often starts out as growths that are not cancer.  Irritation of the stomach lining (gastritis).  Sores (ulcers) in the stomach. What increases the risk? You are more likely to develop this condition if you:  Have a certain type of infection in your stomach (Helicobacter pylori infection).  Take certain medicines.  Smoke.  Drink alcohol. What are the signs or symptoms? Common symptoms of this condition include:  Throwing up (vomiting)  material that has bright red blood in it. It may look like coffee grounds.  Changes in your poop. The poop may: ? Have red blood in it. ? Be black, look like tar, and smell stronger than normal. ? Be red.  Pain or cramping in the belly (abdomen). How is this treated? Treatment for this condition depends on the cause of the bleeding. For example:  Sometimes, the bleeding can be stopped during a procedure that is done to find the problem (endoscopy or colonoscopy).  Medicines can be used to: ? Help control irritation, swelling, or infection. ? Reduce acid in your stomach.  Certain problems can be treated with: ? Creams. ? Medicines that are put in the butt (suppositories). ? Warm baths.  Surgery is sometimes needed.  If you lose a lot of blood, you may need  a blood transfusion. If bleeding is mild, you may be allowed to go home. If there is a lot of bleeding, you will need to stay in the hospital. Follow these instructions at home:   Take over-the-counter and prescription medicines only as told by your doctor.  Eat foods that have a lot of fiber in them. These foods include beans, whole grains, and fresh fruits and vegetables. You can also try eating 1-3 prunes each day.  Drink enough fluid to keep your pee (urine) pale yellow.  Keep all follow-up visits as told by your doctor. This is important. Contact a doctor if:  Your symptoms do not get better. Get help right away if:  Your bleeding does not stop.  You feel dizzy or you pass out (faint).  You feel weak.  You have very bad cramps in your back or belly.  You pass large clumps of blood (clots) in your poop.  Your symptoms are getting worse.  You have chest pain or fast heartbeats. Summary  GI bleeding is bleeding somewhere along the path that food travels through the body (digestive tract).  This bleeding can be caused by many things. Treatment depends on the cause of the bleeding.  Take medicines only as told by your doctor.  Keep all follow-up visits as told by your doctor. This is important. This information is not intended to replace advice given to you by your health care provider. Make sure you discuss any questions you have with your health care provider. Document Revised: 01/22/2018 Document Reviewed: 01/22/2018 Elsevier Patient Education  [place]. ---------------------------------------------------------------------------  Information on my medicine - ELIQUIS (apixaban)  Why was Eliquis prescribed for you? Eliquis was prescribed for you to reduce the risk of a blood clot forming that can cause a stroke if you have a medical condition called atrial fibrillation (a type of irregular heartbeat).  What do You need to know about Eliquis ? Take your  Eliquis TWICE DAILY - one tablet in the morning and one tablet in the evening with or without food. If you have difficulty swallowing the tablet whole please discuss with your pharmacist how to take the medication safely.  Take Eliquis exactly as prescribed by your doctor and DO NOT stop taking Eliquis without talking to the doctor who prescribed the medication.  Stopping may increase your risk of developing a stroke.  Refill your prescription before you run out.  After discharge, you should have regular check-up appointments with your healthcare provider that is prescribing your Eliquis.  In the future your dose may need to be changed if your kidney function or weight changes by a significant amount or as  you get older.  What do you do if you miss a dose? If you miss a dose, take it as soon as you remember on the same day and resume taking twice daily.  Do not take more than one dose of ELIQUIS at the same time to make up a missed dose.  Important Safety Information A possible side effect of Eliquis is bleeding. You should call your healthcare provider right away if you experience any of the following: ? Bleeding from an injury or your nose that does not stop. ? Unusual colored urine (red or dark brown) or unusual colored stools (red or black). ? Unusual bruising for unknown reasons. ? A serious fall or if you hit your head (even if there is no bleeding).  Some medicines may interact with Eliquis and might increase your risk of bleeding or clotting while on Eliquis. To help avoid this, consult your healthcare provider or pharmacist prior to using any new prescription or non-prescription medications, including herbals, vitamins, non-steroidal anti-inflammatory drugs (NSAIDs) and supplements.  This website has more information on Eliquis (apixaban): [email or web address]

## 2019-08-30 NOTE — Discharge Summary (Signed)
Physician Discharge Summary  Crystal Cooper EGB:[id number] DOB: [birth year] DOA: 08/25/2019  PCP: Baruch Gouty, FNP  Admit date: 08/25/2019 Discharge date: 08/30/2019  Admitted From: Home Disposition: Home  Recommendations for Outpatient Follow-up:  1. Follow up with PCP in 1-2 weeks 2. Please obtain BMP/CBC in one week your next doctors visit.  3. Will be on Eliquis 2.5 mg twice daily 4. Close follow-up outpatient with primary care physician, gastroenterology, neurology and cardiology   Discharge Condition: Stable CODE STATUS: Full code Diet recommendation: 2 g salt  Brief/Interim Summary: 86 year old with history of HTN, asthma, paroxysmal A. fib, aortic stenosis status post surgery, hearing loss, CHF presented with altered mental status.  Recently admitted to the hospital for angioedema secondary to ACE inhibitor which was discontinued.  Reports of copious amount of runny nose for the past 3 weeks.  Upon admission chest x-ray showed bilateral upper lobe infiltrates suspect pneumonia and neurology team was consulted.  MRI was positive for acute/subacute multifocal CVA therefore neurology team consulted.  Given her CVA secondary to uncontrolled A. fib in the setting of intolerance to Eliquis in the past, extensive discussion happened between multiple services including cardiology, gastroenterology and neurology.  Eventually it was decided to start patient on Eliquis 2.5 mg twice daily.  No obvious bleeding in the hospital after starting Eliquis was noted therefore she was discharged home on this. I also had extensive discussion with the patient's daughter regarding starting of anticoagulation and how to closely monitor outpatient.  Acute multifocal CVA likely secondary to atrial fibrillation, not on anticoagulation -MRI brain-consistent with multifocal CVA.  Stroke team consulted -Initial CT head-negative for acute pathology except chronic infarcts. -LDL 83, A1c 5.3 -Carotid Dopplers-1-39%  stenosis bilaterally -Supportive care.  PT/OT-home health -Discharged on Eliquis 2.5 mg twice daily, closely monitor outpatient.  Appreciate input from neurology, gastroenterology  Bright red blood per rectum -Hemoccult positive, seen by GI.  This is subsided for now.  Okay to start low-dose Eliquis along with PPI twice daily.  Bilateral upper lobe infiltrate concerning for pneumonia Aspiration/persistent postnasal drip -COVID-19 negative.  Procalcitonin negative.  Will stop antibiotics -BNP mildly elevated.  Hold off on further diuresis -Bronchodilators, incentive spirometer and flutter valve -Speech and swallow -mild aspiration risk, regular thin liquid okay. -Strep pneumo antigen, Legionella-negative  Presyncope- she has some autonomic orthostasis.  History of biosynthetic aortic valve status post TAVR  History of paroxysmal atrial fibrillation Sick sinus syndrome status post pacemaker -Low-dose Eliquis 2.5 mg twice daily. -Metoprolol 25 mg twice daily  Essential hypertension -Metoprolol twice daily    Discharge Diagnoses:  Principal Problem:   Acute CVA (cerebrovascular accident) ([place]) Active Problems:   Essential hypertension   PAF (paroxysmal atrial fibrillation) ([facility])   Asthma   S/P TAVR (transcatheter aortic valve replacement)   Diastolic dysfunction   CKD (chronic kidney disease), stage III   PNA (pneumonia)   Occult blood in stools    Consultations:  Neurology  Gastroenterology  Subjective: Feels much better this morning, no complaints.  Have extensively spoken with her daughter while I was in the room as well.  Discharge Exam: Vitals:   08/29/19 2246 08/30/19 0745  BP: (!) 140/57 (!) 134/99  Pulse: 73 (!) 59  Resp: 18 (!) 22  Temp: 98.5 F (36.9 C) 97.9 F (36.6 C)  SpO2: 95% 98%   Vitals:   08/29/19 1748 08/29/19 2004 08/29/19 2246 08/30/19 0745  BP: (!) 125/51  (!) 140/57 (!) 134/99  Pulse: 62  73 (!) 59  Resp: 19  18 (!) 22   Temp: 98.1 F (36.7 C)  98.5 F (36.9 C) 97.9 F (36.6 C)  TempSrc:   Oral   SpO2: 98% 95% 95% 98%  Weight:      Height:        General: Pt is alert, awake, not in acute distress Cardiovascular: RRR, S1/S2 +, no rubs, no gallops Respiratory: CTA bilaterally, no wheezing, no rhonchi Abdominal: Soft, NT, ND, bowel sounds + Extremities: no edema, no cyanosis  Discharge Instructions  Discharge Instructions    Diet - low sodium heart healthy   Complete by: As directed    Discharge instructions   Complete by: As directed    You were cared for by a hospitalist during your hospital stay. If you have any questions about your discharge medications or the care you received while you were in the hospital after you are discharged, you can call the unit and asked to speak with the hospitalist on call if the hospitalist that took care of you is not available. Once you are discharged, your primary care physician will handle any further medical issues. Please note that NO REFILLS for any discharge medications will be authorized once you are discharged, as it is imperative that you return to your primary care physician (or establish a relationship with a primary care physician if you do not have one) for your aftercare needs so that they can reassess your need for medications and monitor your lab values.  Please request your Prim.MD to go over all Hospital Tests and Procedure/Radiological results at the follow up, please get all Hospital records sent to your Prim MD by signing hospital release before you go home.  Get CBC, CMP, 2 view Chest X ray checked  by Primary MD during your next visit or SNF MD in 5-7 days ( we routinely change or add medications that can affect your baseline labs and fluid status, therefore we recommend that you get the mentioned basic workup next visit with your PCP, your PCP may decide not to get them or add new tests based on their clinical decision)  On your next visit with  your primary care physician please Get Medicines reviewed and adjusted.  If you experience worsening of your admission symptoms, develop shortness of breath, life threatening emergency, suicidal or homicidal thoughts you must seek medical attention immediately by calling 911 or calling your MD immediately  if symptoms less severe.  You Must read complete instructions/literature along with all the possible adverse reactions/side effects for all the Medicines you take and that have been prescribed to you. Take any new Medicines after you have completely understood and accpet all the possible adverse reactions/side effects.   Do not drive, operate heavy machinery, perform activities at heights, swimming or participation in water activities or provide baby sitting services if your were admitted for syncope or siezures until you have seen by Primary MD or a Neurologist and advised to do so again.  Do not drive when taking Pain medications.   Increase activity slowly   Complete by: As directed      Allergies as of 08/30/2019      Reactions   Hctz [hydrochlorothiazide] Other (See Comments)   Pt was ill and this affected her kidneys   Aspirin Other (See Comments)   Cardiologist said the patient is to not take this   Codeine Other (See Comments)   Made the patient feel ill, has not had any problems since 1977  Medication List    TAKE these medications   acetaminophen 500 MG tablet Commonly known as: TYLENOL Take 500 mg by mouth every 6 (six) hours as needed for mild pain or headache.   albuterol 108 (90 Base) MCG/ACT inhaler Commonly known as: ProAir HFA Inhale 2 puffs into the lungs every 4 (four) hours as needed for wheezing or shortness of breath.   apixaban 2.5 MG Tabs tablet Commonly known as: ELIQUIS Take 1 tablet (2.5 mg total) by mouth 2 (two) times daily.   Biotin 10 MG Caps Take 10 mg by mouth daily.   Caltrate 600+D3 600-800 MG-UNIT Tabs Generic drug: Calcium  Carb-Cholecalciferol Take 1 tablet by mouth daily.   docusate sodium 100 MG capsule Commonly known as: COLACE Take 100 mg by mouth daily as needed for mild constipation.   enalapril 2.5 MG tablet Commonly known as: VASOTEC Take 2.5 mg by mouth daily.   ferrous sulfate 325 (65 FE) MG tablet Take 1 tablet (325 mg total) by mouth 2 (two) times daily with a meal.   furosemide 40 MG tablet Commonly known as: LASIX Take 1.5 tablets (60 mg total) by mouth daily.   metoprolol tartrate 25 MG tablet Commonly known as: LOPRESSOR Take 1 tablet (25 mg total) by mouth 2 (two) times daily.   multivitamin-iron-minerals-folic acid chewable tablet Chew 1 tablet by mouth daily.   pantoprazole 40 MG tablet Commonly known as: PROTONIX Take 1 tablet (40 mg total) by mouth 2 (two) times daily before a meal. What changed: when to take this      Follow-up Information    [facility] Follow up.   Specialty: Home Health Services Contact information: [place] [address] [phone]        Baruch Gouty, FNP. Schedule an appointment as soon as possible for a visit in 1 week(s).   Specialty: Family Medicine Contact information: [place] [address] [phone]        Jerene Bears, MD. Schedule an appointment as soon as possible for a visit in 3 week(s).   Specialty: Gastroenterology Contact information: [address] [place] [address] [phone]        Garvin Fila, MD. Schedule an appointment as soon as possible for a visit in 4 week(s).   Specialties: Neurology, Radiology Contact information: [address] [place] [address] [phone]          Allergies  Allergen Reactions  . Hctz [Hydrochlorothiazide] Other (See Comments)    Pt was ill and this affected her kidneys   . Aspirin Other (See Comments)    Cardiologist said the patient is to not take this  . Codeine Other (See  Comments)    Made the patient feel ill, has not had any problems since 1977    You were cared for by a hospitalist during your hospital stay. If you have any questions about your discharge medications or the care you received while you were in the hospital after you are discharged, you can call the unit and asked to speak with the hospitalist on call if the hospitalist that took care of you is not available. Once you are discharged, your primary care physician will handle any further medical issues. Please note that no refills for any discharge medications will be authorized once you are discharged, as it is imperative that you return to your primary care physician (or establish a relationship with a primary care  physician if you do not have one) for your aftercare needs so that they can reassess your need for medications and monitor your lab values.   Procedures/Studies: CT Head Wo Contrast  Result Date: 08/25/2019 CLINICAL DATA:  Altered mental status. Encephalopathy. Patient was found down. EXAM: CT HEAD WITHOUT CONTRAST TECHNIQUE: Contiguous axial images were obtained from the base of the skull through the vertex without intravenous contrast. COMPARISON:  None. FINDINGS: Brain: No evidence of acute infarction, hemorrhage, hydrocephalus, extra-axial collection or mass lesion/mass effect. There multiple old bilateral lacunar infarcts, more prominent on the right than the left. Periventricular white matter lucency consistent with small vessel ischemic disease. Diffuse mild cerebral cortical atrophy with secondary ventricular dilatation. Vascular: No hyperdense vessel or unexpected calcification. Skull: Normal. Negative for fracture or focal lesion. Sinuses/Orbits: Normal. Other: None IMPRESSION: No acute abnormalities. 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 Electronically Signed   By: Lorriane Shire M.D.   On: 08/25/2019 17:55   CT Soft Tissue Neck W Contrast  Result Date:  08/23/2019 CLINICAL DATA:  Epiglottitis or tonsillitis suspected. Shortness of breath with sensation of throat swelling. EXAM: CT NECK WITH CONTRAST TECHNIQUE: Multidetector CT imaging of the neck was performed using the standard protocol following the bolus administration of intravenous contrast. CONTRAST:  98mL OMNIPAQUE IOHEXOL 300 MG/ML  SOLN COMPARISON:  Chest CT 07/06/2016. FINDINGS: Pharynx and larynx: No evidence of mass or swelling. No fluid collection or inflammatory changes in the parapharyngeal or retropharyngeal spaces. Salivary glands: No inflammation, mass, or stone. Thyroid: 8 mm hypoattenuating right thyroid nodule for which no follow-up is recommended. Lymph nodes: No enlarged or suspicious lymph nodes in the neck. Vascular: Major vascular structures of the neck are patent. Moderate calcified atherosclerosis at the carotid bifurcations with likely mild proximal right ICA stenosis. Aortic atherosclerosis. Limited intracranial: Unremarkable. Visualized orbits: Not imaged. Mastoids and visualized paranasal sinuses: Clear. Skeleton: Advanced disc and moderate facet degeneration in the cervical spine with severe asymmetric right neural foraminal stenosis at C3-4, C4-5, and C6-7 and severe neural foraminal stenosis bilaterally at C5-6. Multilevel spinal stenosis, greatest at C5-6 where a partially calcified central disc protrusion results in severe spinal stenosis. Upper chest: Motion artifact in the lung apices with biapical pleuroparenchymal scarring. Small ground-glass opacities medially and laterally in the right upper lobe, new from the 2018 chest CT. Chronic branching density in the left upper lobe suggesting mucoid impaction. Partially visualized ICD. Other: None. IMPRESSION: 1. No acute abnormality identified in the neck. 2. Small ground-glass opacities in the right lung apex with assessment limited by motion artifact, nonspecific though likely infectious or inflammatory. 3. Advanced cervical  disc and facet degeneration with severe spinal stenosis at C5-6 and severe multilevel neural foraminal stenosis. 4. Aortic Atherosclerosis (ICD10-I70.0). Electronically Signed   By: Logan Bores M.D.   On: 08/23/2019 18:23   MR BRAIN WO CONTRAST  Result Date: 08/26/2019 CLINICAL DATA:  Neuro deficit, subacute. EXAM: MRI HEAD WITHOUT CONTRAST TECHNIQUE: Multiplanar, multiecho pulse sequences of the brain and surrounding structures were obtained without intravenous contrast. COMPARISON:  Noncontrast head CT 08/25/2019 FINDINGS: Brain: There is a 13 mm focus of restricted diffusion within the right thalamocapsular junction consistent with acute/early subacute infarction. There are additional punctate acute/early subacute infarcts within the right insular cortex (series 3, image 22) and right parietal cortex (series 3, image 30). There is an additional punctate focus of cortical diffusion weighted hyperintensity within the right occipital lobe which may reflect an additional acute/early subacute infarct versus  artifact (series 3, image 16). Moderate to severe patchy and confluent T2/FLAIR hyperintensity within the cerebral white matter is nonspecific, but consistent with chronic small vessel ischemic disease. Chronic infarcts in the basal ganglia, right thalamus and bilateral cerebellar hemispheres. To a lesser degree, chronic ischemic changes are also present within the pons. Moderate generalized parenchymal atrophy. Vascular: Flow voids maintained within the proximal large arterial vessels. Skull and upper cervical spine: No focal marrow lesion. Sinuses/Orbits: Visualized orbits demonstrate no acute abnormality. Minimal ethmoid sinus mucosal thickening. No significant mastoid effusion. IMPRESSION: 13 mm acute/early subacute infarct within the right thalamocapsular junction. Punctate acute/early subacute cortical infarcts, one within the right insula and one within the right parietal lobe. An additional punctate  acute/early subacute cortical infarct is questioned within the right occipital lobe. Moderate to advanced chronic small vessel ischemic disease. Multiple chronic infarcts within the basal ganglia, right thalamus and bilateral cerebellar hemispheres. Electronically Signed   By: Kellie Simmering DO   On: 08/26/2019 14:21   DG Chest Port 1 View  Result Date: 08/25/2019 CLINICAL DATA:  Altered mental status.  Found on floor EXAM: PORTABLE CHEST 1 VIEW COMPARISON:  08/23/2019 FINDINGS: TAVR unchanged in position.  Dual lead pacemaker unchanged. Negative for heart failure.  No pleural effusion. Streaky markings in the lung apices have developed since the prior study and could be due to pneumonia. Follow-up recommended. IMPRESSION: Possible upper lobe infiltrates bilaterally. Electronically Signed   By: Franchot Gallo M.D.   On: 08/25/2019 16:20   DG Chest Port 1 View  Result Date: 08/23/2019 CLINICAL DATA:  Shortness of breath. Allergic reaction. Aortic stenosis. CHF. EXAM: PORTABLE CHEST 1 VIEW COMPARISON:  04/06/2018 FINDINGS: Pacer. Aortic valve repair. Midline trachea. Mild cardiomegaly. Atherosclerosis in the transverse aorta. No pleural effusion or pneumothorax. Hyperinflation. Moderate interstitial thickening. Left base scarring. IMPRESSION: 1. No acute cardiopulmonary disease. 2. Hyperinflation and interstitial thickening, most consistent with COPD/chronic bronchitis. 3.  Cardiomegaly without congestive failure. 4.  Aortic Atherosclerosis (ICD10-I70.0). Electronically Signed   By: Abigail Miyamoto M.D.   On: 08/23/2019 17:17   CUP PACEART REMOTE DEVICE CHECK  Result Date: 08/12/2019 Scheduled remote reviewed.  Normal device function.  AF burden 10%, increase noted since November (4%); ventricular rates controlled. Not [place] candidate. Next remote 91 days.   [facility]  VAS US CAROTID  Result Date: 08/27/2019 Carotid Arterial Duplex Study Indications:       CVA. Risk Factors:      Hypertension. Limitations         Today's exam was limited due to the body habitus of the                    patient and patient anatomy. Comparison Study:  No prior studies. Performing Technologist: Oliver Hum RVT  Examination Guidelines: A complete evaluation includes B-mode imaging, spectral Doppler, color Doppler, and power Doppler as needed of all accessible portions of each vessel. Bilateral testing is considered an integral part of a complete examination. Limited examinations for reoccurring indications may be performed as noted.  Right Carotid Findings: +----------+--------+--------+--------+-----------------------+--------+           PSV cm/sEDV cm/sStenosisPlaque Description     Comments +----------+--------+--------+--------+-----------------------+--------+ CCA Prox  52      7               smooth and heterogenoustortuous +----------+--------+--------+--------+-----------------------+--------+ CCA Distal49      8               smooth and heterogenous         +----------+--------+--------+--------+-----------------------+--------+  ICA Prox  76      13              calcific and irregular          +----------+--------+--------+--------+-----------------------+--------+ ICA Distal52      10                                     tortuous +----------+--------+--------+--------+-----------------------+--------+ ECA       56                                                      +----------+--------+--------+--------+-----------------------+--------+ +----------+--------+-------+--------+-------------------+           PSV cm/sEDV cmsDescribeArm Pressure (mmHG) +----------+--------+-------+--------+-------------------+ [id number]                                         +----------+--------+-------+--------+-------------------+ +---------+--------+--+--------+-+---------+ VertebralPSV cm/s28EDV cm/s4Antegrade +---------+--------+--+--------+-+---------+  Left Carotid Findings:  +----------+--------+--------+--------+-----------------------+--------+           PSV cm/sEDV cm/sStenosisPlaque Description     Comments +----------+--------+--------+--------+-----------------------+--------+ CCA Prox  62      9               smooth and heterogenoustortuous +----------+--------+--------+--------+-----------------------+--------+ CCA Distal53      11              smooth and heterogenous         +----------+--------+--------+--------+-----------------------+--------+ ICA Prox  43      9               calcific and irregular          +----------+--------+--------+--------+-----------------------+--------+ ICA Distal49      11                                     tortuous +----------+--------+--------+--------+-----------------------+--------+ ECA       63      0                                               +----------+--------+--------+--------+-----------------------+--------+ +----------+--------+--------+--------+-------------------+           PSV cm/sEDV cm/sDescribeArm Pressure (mmHG) +----------+--------+--------+--------+-------------------+ [id number]                                          +----------+--------+--------+--------+-------------------+ +---------+--------+--+--------+--+---------+ VertebralPSV cm/s37EDV cm/s11Antegrade +---------+--------+--+--------+--+---------+   Summary: Right Carotid: Velocities in the right ICA are consistent with a 1-39% stenosis. Left Carotid: Velocities in the left ICA are consistent with a 1-39% stenosis. Vertebrals: Bilateral vertebral arteries demonstrate antegrade flow. *See table(s) above for measurements and observations.  Electronically signed by Servando Snare MD on 08/27/2019 at 1:13:43 PM.    Final       The results of significant diagnostics from this hospitalization (including imaging, microbiology, ancillary and laboratory) are listed below for reference.      Microbiology: Recent Results (from the past 240  hour(s))  SARS CORONAVIRUS 2 (TAT 6-24 HRS) Nasopharyngeal Nasopharyngeal Swab     Status: None   Collection Time: 08/23/19  7:45 PM   Specimen: Nasopharyngeal Swab  Result Value Ref Range Status   SARS Coronavirus 2 NEGATIVE NEGATIVE Final    Comment: (NOTE) SARS-CoV-2 target nucleic acids are NOT DETECTED. The SARS-CoV-2 RNA is generally detectable in upper and lower respiratory specimens during the acute phase of infection. Negative results do not preclude SARS-CoV-2 infection, do not rule out co-infections with other pathogens, and should not be used as the sole basis for treatment or other patient management decisions. Negative results must be combined with clinical observations, patient history, and epidemiological information. The expected result is Negative. Fact Sheet for Patients: SugarRoll.be Fact Sheet for Healthcare Providers: https://www.woods-mathews.com/ This test is not yet approved or cleared by the [place] FDA and  has been authorized for detection and/or diagnosis of SARS-CoV-2 by FDA under an Emergency Use Authorization (EUA). This EUA will remain  in effect (meaning this test can be used) for the duration of the COVID-19 declaration under Section 56 4(b)(1) of the Act, 21 U.S.C. section 360bbb-3(b)(1), unless the authorization is terminated or revoked sooner. Performed at [facility], [place] [address], [place], [place] [address]   SARS CORONAVIRUS 2 (TAT 6-24 HRS) Nasopharyngeal Nasopharyngeal Swab     Status: None   Collection Time: 08/25/19  6:04 PM   Specimen: Nasopharyngeal Swab  Result Value Ref Range Status   SARS Coronavirus 2 NEGATIVE NEGATIVE Final    Comment: (NOTE) SARS-CoV-2 target nucleic acids are NOT DETECTED. The SARS-CoV-2 RNA is generally detectable in upper and lower respiratory specimens during the acute phase of infection.  Negative results do not preclude SARS-CoV-2 infection, do not rule out co-infections with other pathogens, and should not be used as the sole basis for treatment or other patient management decisions. Negative results must be combined with clinical observations, patient history, and epidemiological information. The expected result is Negative. Fact Sheet for Patients: SugarRoll.be Fact Sheet for Healthcare Providers: https://www.woods-mathews.com/ This test is not yet approved or cleared by the [place] FDA and  has been authorized for detection and/or diagnosis of SARS-CoV-2 by FDA under an Emergency Use Authorization (EUA). This EUA will remain  in effect (meaning this test can be used) for the duration of the COVID-19 declaration under Section 56 4(b)(1) of the Act, 21 U.S.C. section 360bbb-3(b)(1), unless the authorization is terminated or revoked sooner. Performed at [facility], [place] [address] [place], [place] [address]   Blood culture (routine x 2)     Status: None   Collection Time: 08/25/19  6:06 PM   Specimen: BLOOD RIGHT FOREARM  Result Value Ref Range Status   Specimen Description BLOOD RIGHT FOREARM  Final   Special Requests   Final    BOTTLES DRAWN AEROBIC AND ANAEROBIC Blood Culture results may not be optimal due to an inadequate volume of blood received in culture bottles   Culture   Final    NO GROWTH 5 DAYS Performed at [facility], [place] [address] [place], [place] [address]    Report Status 08/30/2019 FINAL  Final  Blood culture (routine x 2)     Status: None   Collection Time: 08/25/19  6:06 PM   Specimen: BLOOD LEFT HAND  Result Value Ref Range Status   Specimen Description BLOOD LEFT HAND  Final   Special Requests   Final    BOTTLES DRAWN  AEROBIC AND ANAEROBIC Blood Culture results may not be optimal due to an inadequate volume of blood received in culture bottles   Culture   Final    NO GROWTH  5 DAYS Performed at [facility], [place] [address] [place], [place] [address]    Report Status 08/30/2019 FINAL  Final     Labs: BNP (last 3 results) Recent Labs    08/26/19 1135  BNP 952.8*   Basic Metabolic Panel: Recent Labs  Lab 08/25/19 1525 08/27/19 0117 08/28/19 0112 08/29/19 0338 08/30/19 0052  NA 136 137 137 138 135  K 3.7 3.1* 4.0 4.2 5.2*  CL 99 99 99 102 101  CO2 25 25 29 27 25   GLUCOSE 155* 143* 151* 127* 116*  BUN 24* 24* 24* 23 22  CREATININE 1.07* 1.46* 1.33* 1.18* 1.16*  CALCIUM 9.1 8.4* 8.6* 9.0 8.8*  MG  --  1.8 2.0 2.2 2.2   Liver Function Tests: Recent Labs  Lab 08/24/19 0321 08/25/19 1525  AST 26 43*  ALT 20 26  ALKPHOS 46 56  BILITOT 1.6* 1.1  PROT 6.1* 6.7  ALBUMIN 3.2* 3.5   No results for input(s): LIPASE, AMYLASE in the last 168 hours. No results for input(s): AMMONIA in the last 168 hours. CBC: Recent Labs  Lab 08/25/19 1525 08/25/19 1525 08/27/19 0117 08/27/19 1639 08/28/19 0112 08/29/19 0338 08/30/19 0052  WBC 9.9  --  7.3  --  5.6 5.9 6.7  HGB 12.3  --  11.5*  --  10.6* 10.7* 10.6*  HCT 37.4   < > 34.5* 31.9* 32.4* 32.9* 32.6*  MCV 96.4  --  96.4  --  97.3 97.9 97.3  PLT 226  --  190  --  168 155 153   < > = values in this interval not displayed.   Cardiac Enzymes: Recent Labs  Lab 08/25/19 1525  CKTOTAL 263*   BNP: Invalid input(s): POCBNP CBG: Recent Labs  Lab 08/24/19 1138 08/25/19 1628  GLUCAP 115* 109*   D-Dimer No results for input(s): DDIMER in the last 72 hours. Hgb A1c No results for input(s): HGBA1C in the last 72 hours. Lipid Profile No results for input(s): CHOL, HDL, LDLCALC, TRIG, CHOLHDL, LDLDIRECT in the last 72 hours. Thyroid function studies No results for input(s): TSH, T4TOTAL, T3FREE, THYROIDAB in the last 72 hours.  Invalid input(s): FREET3 Anemia work up Recent Labs    08/27/19 1639  VITAMINB12 1,497*  FERRITIN 55  TIBC 263  IRON 57   Urinalysis    Component  Value Date/Time   COLORURINE COLORLESS (A) 08/25/2019 1615   APPEARANCEUR CLEAR 08/25/2019 1615   APPEARANCEUR Clear 08/03/2019 1620   LABSPEC 1.005 08/25/2019 1615   PHURINE 8.0 08/25/2019 1615   GLUCOSEU NEGATIVE 08/25/2019 1615   HGBUR SMALL (A) 08/25/2019 1615   BILIRUBINUR NEGATIVE 08/25/2019 1615   BILIRUBINUR Negative 08/03/2019 [place] 08/25/2019 1615   PROTEINUR NEGATIVE 08/25/2019 1615   NITRITE NEGATIVE 08/25/2019 1615   LEUKOCYTESUR NEGATIVE 08/25/2019 1615   Sepsis Labs Invalid input(s): PROCALCITONIN,  WBC,  LACTICIDVEN Microbiology Recent Results (from the past 240 hour(s))  SARS CORONAVIRUS 2 (TAT 6-24 HRS) Nasopharyngeal Nasopharyngeal Swab     Status: None   Collection Time: 08/23/19  7:45 PM   Specimen: Nasopharyngeal Swab  Result Value Ref Range Status   SARS Coronavirus 2 NEGATIVE NEGATIVE Final    Comment: (NOTE) SARS-CoV-2 target nucleic acids are NOT DETECTED. The SARS-CoV-2 RNA is generally detectable  in upper and lower respiratory specimens during the acute phase of infection. Negative results do not preclude SARS-CoV-2 infection, do not rule out co-infections with other pathogens, and should not be used as the sole basis for treatment or other patient management decisions. Negative results must be combined with clinical observations, patient history, and epidemiological information. The expected result is Negative. Fact Sheet for Patients: SugarRoll.be Fact Sheet for Healthcare Providers: https://www.woods-mathews.com/ This test is not yet approved or cleared by the [place] FDA and  has been authorized for detection and/or diagnosis of SARS-CoV-2 by FDA under an Emergency Use Authorization (EUA). This EUA will remain  in effect (meaning this test can be used) for the duration of the COVID-19 declaration under Section 56 4(b)(1) of the Act, 21 U.S.C. section 360bbb-3(b)(1), unless the  authorization is terminated or revoked sooner. Performed at [facility], [place] [address], [place], [place] [address]   SARS CORONAVIRUS 2 (TAT 6-24 HRS) Nasopharyngeal Nasopharyngeal Swab     Status: None   Collection Time: 08/25/19  6:04 PM   Specimen: Nasopharyngeal Swab  Result Value Ref Range Status   SARS Coronavirus 2 NEGATIVE NEGATIVE Final    Comment: (NOTE) SARS-CoV-2 target nucleic acids are NOT DETECTED. The SARS-CoV-2 RNA is generally detectable in upper and lower respiratory specimens during the acute phase of infection. Negative results do not preclude SARS-CoV-2 infection, do not rule out co-infections with other pathogens, and should not be used as the sole basis for treatment or other patient management decisions. Negative results must be combined with clinical observations, patient history, and epidemiological information. The expected result is Negative. Fact Sheet for Patients: SugarRoll.be Fact Sheet for Healthcare Providers: https://www.woods-mathews.com/ This test is not yet approved or cleared by the [place] FDA and  has been authorized for detection and/or diagnosis of SARS-CoV-2 by FDA under an Emergency Use Authorization (EUA). This EUA will remain  in effect (meaning this test can be used) for the duration of the COVID-19 declaration under Section 56 4(b)(1) of the Act, 21 U.S.C. section 360bbb-3(b)(1), unless the authorization is terminated or revoked sooner. Performed at [facility], [place] [address] [place], [place] [address]   Blood culture (routine x 2)     Status: None   Collection Time: 08/25/19  6:06 PM   Specimen: BLOOD RIGHT FOREARM  Result Value Ref Range Status   Specimen Description BLOOD RIGHT FOREARM  Final   Special Requests   Final    BOTTLES DRAWN AEROBIC AND ANAEROBIC Blood Culture results may not be optimal due to an inadequate volume of blood received in culture bottles    Culture   Final    NO GROWTH 5 DAYS Performed at [facility], [place] [address], [place], [place] [address]    Report Status 08/30/2019 FINAL  Final  Blood culture (routine x 2)     Status: None   Collection Time: 08/25/19  6:06 PM   Specimen: BLOOD LEFT HAND  Result Value Ref Range Status   Specimen Description BLOOD LEFT HAND  Final   Special Requests   Final    BOTTLES DRAWN AEROBIC AND ANAEROBIC Blood Culture results may not be optimal due to an inadequate volume of blood received in culture bottles   Culture   Final    NO GROWTH 5 DAYS Performed at [facility], [place] [address], [place], [place] [address]    Report Status 08/30/2019 FINAL  Final     Time  coordinating discharge:  I have spent 35 minutes face to face with the patient and on the ward discussing the patients care, assessment, plan and disposition with other care givers. >50% of the time was devoted counseling the patient about the risks and benefits of treatment/Discharge disposition and coordinating care.   SIGNED:   Damita Lack, MD  [facility] 08/30/2019, 9:53 AM   If 7PM-7AM, please contact night-coverage

## 2019-08-31 ENCOUNTER — Telehealth: Payer: Self-pay | Admitting: *Deleted

## 2019-08-31 DIAGNOSIS — G9340 Encephalopathy, unspecified: Secondary | ICD-10-CM | POA: Diagnosis not present

## 2019-08-31 DIAGNOSIS — I480 Paroxysmal atrial fibrillation: Secondary | ICD-10-CM | POA: Diagnosis not present

## 2019-08-31 DIAGNOSIS — M4802 Spinal stenosis, cervical region: Secondary | ICD-10-CM | POA: Diagnosis not present

## 2019-08-31 DIAGNOSIS — R918 Other nonspecific abnormal finding of lung field: Secondary | ICD-10-CM | POA: Diagnosis not present

## 2019-08-31 DIAGNOSIS — I700 Atherosclerosis of aorta: Secondary | ICD-10-CM | POA: Diagnosis not present

## 2019-08-31 DIAGNOSIS — Z9181 History of falling: Secondary | ICD-10-CM | POA: Diagnosis not present

## 2019-08-31 DIAGNOSIS — J449 Chronic obstructive pulmonary disease, unspecified: Secondary | ICD-10-CM | POA: Diagnosis not present

## 2019-08-31 DIAGNOSIS — Z8719 Personal history of other diseases of the digestive system: Secondary | ICD-10-CM | POA: Diagnosis not present

## 2019-08-31 DIAGNOSIS — G319 Degenerative disease of nervous system, unspecified: Secondary | ICD-10-CM | POA: Diagnosis not present

## 2019-08-31 DIAGNOSIS — Z8701 Personal history of pneumonia (recurrent): Secondary | ICD-10-CM | POA: Diagnosis not present

## 2019-08-31 DIAGNOSIS — H9190 Unspecified hearing loss, unspecified ear: Secondary | ICD-10-CM | POA: Diagnosis not present

## 2019-08-31 DIAGNOSIS — I495 Sick sinus syndrome: Secondary | ICD-10-CM | POA: Diagnosis not present

## 2019-08-31 DIAGNOSIS — M47812 Spondylosis without myelopathy or radiculopathy, cervical region: Secondary | ICD-10-CM | POA: Diagnosis not present

## 2019-08-31 DIAGNOSIS — Z952 Presence of prosthetic heart valve: Secondary | ICD-10-CM | POA: Diagnosis not present

## 2019-08-31 DIAGNOSIS — E041 Nontoxic single thyroid nodule: Secondary | ICD-10-CM | POA: Diagnosis not present

## 2019-08-31 DIAGNOSIS — M50222 Other cervical disc displacement at C5-C6 level: Secondary | ICD-10-CM | POA: Diagnosis not present

## 2019-08-31 DIAGNOSIS — Z7901 Long term (current) use of anticoagulants: Secondary | ICD-10-CM | POA: Diagnosis not present

## 2019-08-31 DIAGNOSIS — K31819 Angiodysplasia of stomach and duodenum without bleeding: Secondary | ICD-10-CM | POA: Diagnosis not present

## 2019-08-31 DIAGNOSIS — N183 Chronic kidney disease, stage 3 unspecified: Secondary | ICD-10-CM | POA: Diagnosis not present

## 2019-08-31 DIAGNOSIS — Z950 Presence of cardiac pacemaker: Secondary | ICD-10-CM | POA: Diagnosis not present

## 2019-08-31 DIAGNOSIS — I509 Heart failure, unspecified: Secondary | ICD-10-CM | POA: Diagnosis not present

## 2019-08-31 DIAGNOSIS — I2510 Atherosclerotic heart disease of native coronary artery without angina pectoris: Secondary | ICD-10-CM | POA: Diagnosis not present

## 2019-08-31 DIAGNOSIS — I69351 Hemiplegia and hemiparesis following cerebral infarction affecting right dominant side: Secondary | ICD-10-CM | POA: Diagnosis not present

## 2019-08-31 DIAGNOSIS — I130 Hypertensive heart and chronic kidney disease with heart failure and stage 1 through stage 4 chronic kidney disease, or unspecified chronic kidney disease: Secondary | ICD-10-CM | POA: Diagnosis not present

## 2019-08-31 NOTE — Telephone Encounter (Signed)
Pt daughter called - she is home from the hosp.  She takes Metoprolol 25 BID and daughter gave her both this AM. She is aware to monitor BP at home randomly today and let us know if she drops below 90/50 and/ or symptomatic ( light-headed / dizzy) She is also aware to push fluids today - to keep her well hydrated.  Has appt here 3/10 for follow up   Will send to [facility] for FYI

## 2019-09-01 ENCOUNTER — Other Ambulatory Visit: Payer: Self-pay

## 2019-09-02 ENCOUNTER — Ambulatory Visit (INDEPENDENT_AMBULATORY_CARE_PROVIDER_SITE_OTHER): Payer: Medicare Other | Admitting: Family Medicine

## 2019-09-02 ENCOUNTER — Encounter: Payer: Self-pay | Admitting: Family Medicine

## 2019-09-02 VITALS — BP 114/58 | HR 61 | Temp 97.5°F | Resp 18 | Ht 62.0 in | Wt 109.0 lb

## 2019-09-02 DIAGNOSIS — N183 Chronic kidney disease, stage 3 unspecified: Secondary | ICD-10-CM | POA: Diagnosis not present

## 2019-09-02 DIAGNOSIS — I2510 Atherosclerotic heart disease of native coronary artery without angina pectoris: Secondary | ICD-10-CM | POA: Diagnosis not present

## 2019-09-02 DIAGNOSIS — E876 Hypokalemia: Secondary | ICD-10-CM | POA: Diagnosis not present

## 2019-09-02 DIAGNOSIS — I480 Paroxysmal atrial fibrillation: Secondary | ICD-10-CM

## 2019-09-02 DIAGNOSIS — I69351 Hemiplegia and hemiparesis following cerebral infarction affecting right dominant side: Secondary | ICD-10-CM | POA: Diagnosis not present

## 2019-09-02 DIAGNOSIS — J449 Chronic obstructive pulmonary disease, unspecified: Secondary | ICD-10-CM | POA: Diagnosis not present

## 2019-09-02 DIAGNOSIS — I130 Hypertensive heart and chronic kidney disease with heart failure and stage 1 through stage 4 chronic kidney disease, or unspecified chronic kidney disease: Secondary | ICD-10-CM | POA: Diagnosis not present

## 2019-09-02 DIAGNOSIS — Z09 Encounter for follow-up examination after completed treatment for conditions other than malignant neoplasm: Secondary | ICD-10-CM

## 2019-09-02 DIAGNOSIS — I509 Heart failure, unspecified: Secondary | ICD-10-CM | POA: Diagnosis not present

## 2019-09-02 DIAGNOSIS — I639 Cerebral infarction, unspecified: Secondary | ICD-10-CM | POA: Diagnosis not present

## 2019-09-02 DIAGNOSIS — N1831 Chronic kidney disease, stage 3a: Secondary | ICD-10-CM | POA: Diagnosis not present

## 2019-09-02 NOTE — Progress Notes (Signed)
Subjective:  Patient ID: Crystal Cooper, female    DOB: 1927-03-05, 86 y.o.   MRN: [id number]  Patient Care Team: Baruch Gouty, FNP as PCP - General (Family Medicine) Ander Slade, Carlisle Beers, MD as Referring Physician (Ophthalmology) Stanford Breed Denice Bors, MD as Consulting Physician (Cardiology) Constance Haw, MD as Consulting Physician (Cardiology) Ilean China, RN as Registered Nurse (General Practice)   Chief Complaint:  Hospitalization Follow-up ([facility] 3/2-08/30/19 for CVA and CAP )   HPI: Crystal Cooper is a 86 y.o. female presenting on 09/02/2019 for Hospitalization Follow-up ([facility] 3/2-08/30/19 for CVA and CAP )  Pt presents today for hospital discharge follow up. Pt was seen on 08/23/2019 for angioedema and was admitted for observation. She was discharged home on 08/24/2019. Her Norvasc and ACEI were stopped upon admission. She returned to the ED for evaluation on 08/25/2019 due to AMS, lower extremity weakness, and generalized weakness. CXR revealed pneumonia, MRI revealed acute multifocal CVA. Pt has A-Fib and her anticoagulation had been stopped due to recurrent GI bleed and anemia. She was restarted on Eliquis 2.5 mg twice daily during last hospital admission due to CVA. Pts daughter is with her today and reports pt is having a very difficult time with ADL's, memory, and ambulation. Pt is in wheelchair today and has a gait belt in place for assistance with transfers. Pt does not have her hearing aids in today and is having a hard time understanding provider today. She is scheduled to follow up with neurology and GI in the upcoming weeks.      Relevant past medical, surgical, family, and social history reviewed and updated as indicated.  Allergies and medications reviewed and updated. Date reviewed: Chart in Epic.   Past Medical History:  Diagnosis Date  . Anemia    years ago  . Aortic stenosis   . Arthritis   . Asthma   . Atrial fibrillation ([place])   . CHF (congestive  heart failure) ([place]) 11/2014  . Family history of adverse reaction to anesthesia    2 daughters would have N/V  . Glaucoma   . Hearing loss   . Heart murmur   . HTN (hypertension)   . Pneumonia   . Prolapsing mitral leaflet syndrome   . Shortness of breath dyspnea    with exertion  . SVT (supraventricular tachycardia) ([facility])    S/P ablation of AVNRT in 2003  /  Past Surgical History:  Procedure Laterality Date  . APPENDECTOMY    . BIOPSY  04/07/2018   Procedure: BIOPSY;  Surgeon: Jerene Bears, MD;  Location: [facility] ENDOSCOPY;  Service: Gastroenterology;;  . BLADDER SURGERY    . CARDIAC CATHETERIZATION N/A 09/26/2015   Procedure: Right/Left Heart Cath and Coronary Angiography;  Surgeon: Burnell Blanks, MD;  Location: [place] CV LAB;  Service: Cardiovascular;  Laterality: N/A;  . CARDIAC SURGERY    . CARDIOVERSION N/A 03/02/2016   Procedure: CARDIOVERSION;  Surgeon: Thayer Headings, MD;  Location: [place];  Service: Cardiovascular;  Laterality: N/A;  . EP IMPLANTABLE DEVICE N/A 10/26/2015   Procedure: Pacemaker Implant;  Surgeon: Will Meredith Leeds, MD;  Location: [place] CV LAB;  Service: Cardiovascular;  Laterality: N/A;  . ESOPHAGOGASTRODUODENOSCOPY (EGD) WITH PROPOFOL N/A 04/07/2018   Procedure: ESOPHAGOGASTRODUODENOSCOPY (EGD) WITH PROPOFOL;  Surgeon: Jerene Bears, MD;  Location: [facility] ENDOSCOPY;  Service: Gastroenterology;  Laterality: N/A;  . EYE SURGERY Bilateral    cataract surgery  . HOT HEMOSTASIS N/A 04/07/2018  Procedure: HOT HEMOSTASIS (ARGON PLASMA COAGULATION/BICAP);  Surgeon: Jerene Bears, MD;  Location: [facility] ENDOSCOPY;  Service: Gastroenterology;  Laterality: N/A;  . NASAL SINUS SURGERY    . TEE WITHOUT CARDIOVERSION N/A 10/25/2015   Procedure: TRANSESOPHAGEAL ECHOCARDIOGRAM (TEE);  Surgeon: Burnell Blanks, MD;  Location: [place];  Service: Open Heart Surgery;  Laterality: N/A;  . TRANSCATHETER AORTIC VALVE REPLACEMENT, TRANSFEMORAL Right 10/25/2015    Procedure: TRANSCATHETER AORTIC VALVE REPLACEMENT, TRANSFEMORAL;  Surgeon: Burnell Blanks, MD;  Location: [place];  Service: Open Heart Surgery;  Laterality: Right;    Social History   Socioeconomic History  . Marital status: Widowed    Spouse name: Not on file  . Number of children: 3  . Years of education: Not on file  . Highest education level: 12th grade  Occupational History  . Occupation: Retired  Tobacco Use  . Smoking status: Never Smoker  . Smokeless tobacco: Never Used  Substance and Sexual Activity  . Alcohol use: No    Alcohol/week: 0.0 standard drinks  . Drug use: No  . Sexual activity: Never  Other Topics Concern  . Not on file  Social History Narrative  . Not on file   Social Determinants of Health   Financial Resource Strain:   . Difficulty of Paying Living Expenses: Not on file  Food Insecurity: No Food Insecurity  . Worried About Charity fundraiser in the Last Year: Never true  . Ran Out of Food in the Last Year: Never true  Transportation Needs: No Transportation Needs  . Lack of Transportation (Medical): No  . Lack of Transportation (Non-Medical): No  Physical Activity: Inactive  . Days of Exercise per Week: 0 days  . Minutes of Exercise per Session: 0 min  Stress: No Stress Concern Present  . Feeling of Stress : Not at all  Social Connections: Slightly Isolated  . Frequency of Communication with Friends and Family: More than three times a week  . Frequency of Social Gatherings with Friends and Family: More than three times a week  . Attends Religious Services: More than 4 times per year  . Active Member of Clubs or Organizations: Yes  . Attends Archivist Meetings: More than 4 times per year  . Marital Status: Widowed  Intimate Partner Violence: Not At Risk  . Fear of Current or Ex-Partner: No  . Emotionally Abused: No  . Physically Abused: No  . Sexually Abused: No    Outpatient Encounter Medications as of 09/02/2019    Medication Sig  . acetaminophen (TYLENOL) 500 MG tablet Take 500 mg by mouth every 6 (six) hours as needed for mild pain or headache.   . albuterol (PROAIR HFA) 108 (90 Base) MCG/ACT inhaler Inhale 2 puffs into the lungs every 4 (four) hours as needed for wheezing or shortness of breath.  [name] apixaban (ELIQUIS) 2.5 MG TABS tablet Take 1 tablet (2.5 mg total) by mouth 2 (two) times daily.  . Biotin 10 MG CAPS Take 10 mg by mouth daily.   . Calcium Carb-Cholecalciferol (CALTRATE 600+D3) 600-800 MG-UNIT TABS Take 1 tablet by mouth daily.  [name] docusate sodium (COLACE) 100 MG capsule Take 100 mg by mouth daily as needed for mild constipation.  . enalapril (VASOTEC) 2.5 MG tablet Take 2.5 mg by mouth daily.  . ferrous sulfate 325 (65 FE) MG tablet Take 1 tablet (325 mg total) by mouth 2 (two) times daily with a meal.  . furosemide (LASIX) 40 MG tablet Take 1.5  tablets (60 mg total) by mouth daily.  . metoprolol tartrate (LOPRESSOR) 25 MG tablet Take 1 tablet (25 mg total) by mouth 2 (two) times daily.  . multivitamin-iron-minerals-folic acid (CENTRUM) chewable tablet Chew 1 tablet by mouth daily.  . pantoprazole (PROTONIX) 40 MG tablet Take 1 tablet (40 mg total) by mouth 2 (two) times daily before a meal.   No facility-administered encounter medications on file as of 09/02/2019.    Allergies  Allergen Reactions  . Hctz [Hydrochlorothiazide] Other (See Comments)    Pt was ill and this affected her kidneys   . Aspirin Other (See Comments)    Cardiologist said the patient is to not take this  . Codeine Other (See Comments)    Made the patient feel ill, has not had any problems since 1977    Review of Systems  Constitutional: Positive for activity change and appetite change. Negative for chills, diaphoresis, fatigue, fever and unexpected weight change.  HENT: Positive for hearing loss.   Eyes: Negative.  Negative for photophobia and visual disturbance.  Respiratory: Positive for wheezing.  Negative for cough, chest tightness and shortness of breath.   Cardiovascular: Negative for chest pain, palpitations and leg swelling.  Gastrointestinal: Negative for abdominal pain, anal bleeding, blood in stool, constipation, diarrhea, nausea and vomiting.  Endocrine: Negative.  Negative for polydipsia, polyphagia and polyuria.  Genitourinary: Negative for decreased urine volume, difficulty urinating, dysuria, frequency, hematuria and urgency.  Musculoskeletal: Negative for arthralgias and myalgias.  Skin: Negative.  Negative for color change and pallor.  Allergic/Immunologic: Negative.   Neurological: Positive for weakness. Negative for dizziness, tremors, seizures, syncope, facial asymmetry, speech difficulty, light-headedness, numbness and headaches.  Hematological: Negative.   Psychiatric/Behavioral: Positive for confusion. Negative for hallucinations, sleep disturbance and suicidal ideas.  All other systems reviewed and are negative.       Objective:  BP (!) 114/58   Pulse 61   Temp (!) 97.5 F (36.4 C)   Resp 18   Ht 5' 2"  (1.575 m)   Wt 109 lb (49.4 kg)   SpO2 99%   BMI 19.94 kg/m    Wt Readings from Last 3 Encounters:  09/02/19 109 lb (49.4 kg)  08/25/19 109 lb (49.4 kg)  08/24/19 108 lb 11.2 oz (49.3 kg)    Physical Exam Vitals and nursing note reviewed.  Constitutional:      General: She is not in acute distress.    Appearance: Normal appearance. She is well-developed, well-groomed and normal weight. She is not ill-appearing, toxic-appearing or diaphoretic.  HENT:     Head: Normocephalic and atraumatic.     Jaw: There is normal jaw occlusion.     Right Ear: Decreased hearing noted.     Left Ear: Decreased hearing noted.     Nose: Nose normal.     Mouth/Throat:     Lips: Pink.     Mouth: Mucous membranes are moist.     Pharynx: Oropharynx is clear. Uvula midline.  Eyes:     General: Lids are normal.     Extraocular Movements: Extraocular movements intact.      Conjunctiva/sclera: Conjunctivae normal.     Pupils: Pupils are equal, round, and reactive to light.  Neck:     Thyroid: No thyroid mass, thyromegaly or thyroid tenderness.     Vascular: No carotid bruit or JVD.     Trachea: Trachea and phonation normal.  Cardiovascular:     Rate and Rhythm: Normal rate. Rhythm irregular.     Chest  Wall: PMI is not displaced.     Pulses: Normal pulses.     Heart sounds: Normal heart sounds. No murmur. No friction rub. No gallop.   Pulmonary:     Effort: Pulmonary effort is normal. No respiratory distress.     Breath sounds: Rhonchi (bilateral upper lobes, minimal) present. No wheezing.  Abdominal:     General: Bowel sounds are normal. There is no distension or abdominal bruit.     Palpations: Abdomen is soft. There is no hepatomegaly or splenomegaly.     Tenderness: There is no abdominal tenderness. There is no right CVA tenderness or left CVA tenderness.     Hernia: No hernia is present.  Musculoskeletal:        General: Normal range of motion.     Cervical back: Normal range of motion and neck supple.     Right lower leg: No edema.     Left lower leg: No edema.  Lymphadenopathy:     Cervical: No cervical adenopathy.  Skin:    General: Skin is warm and dry.     Capillary Refill: Capillary refill takes less than 2 seconds.     Coloration: Skin is not cyanotic, jaundiced or pale.     Findings: No rash.  Neurological:     General: No focal deficit present.     Mental Status: She is alert.     Cranial Nerves: Cranial nerves are intact. No cranial nerve deficit.     Sensory: Sensation is intact.     Motor: Weakness (generalized) present.     Coordination: Coordination is intact.     Gait: Gait abnormal (in wheelchair today).     Deep Tendon Reflexes: Reflexes are normal and symmetric.  Psychiatric:        Attention and Perception: Attention and perception normal.        Mood and Affect: Mood and affect normal.        Speech: Speech normal.         Behavior: Behavior normal. Behavior is cooperative.        Thought Content: Thought content normal.        Cognition and Memory: Cognition and memory normal.        Judgment: Judgment normal.     Results for orders placed or performed during the hospital encounter of 08/25/19  Blood culture (routine x 2)   Specimen: BLOOD RIGHT FOREARM  Result Value Ref Range   Specimen Description BLOOD RIGHT FOREARM    Special Requests      BOTTLES DRAWN AEROBIC AND ANAEROBIC Blood Culture results may not be optimal due to an inadequate volume of blood received in culture bottles   Culture      NO GROWTH 5 DAYS Performed at [facility] [address], [place], [place] [address]    Report Status 08/30/2019 FINAL   Blood culture (routine x 2)   Specimen: BLOOD LEFT HAND  Result Value Ref Range   Specimen Description BLOOD LEFT HAND    Special Requests      BOTTLES DRAWN AEROBIC AND ANAEROBIC Blood Culture results may not be optimal due to an inadequate volume of blood received in culture bottles   Culture      NO GROWTH 5 DAYS Performed at [facility] [address] [place], [place] [address]    Report Status 08/30/2019 FINAL   SARS CORONAVIRUS 2 (TAT 6-24 HRS) Nasopharyngeal Nasopharyngeal Swab   Specimen: Nasopharyngeal Swab  Result Value  Ref Range   SARS Coronavirus 2 NEGATIVE NEGATIVE  Comprehensive metabolic panel  Result Value Ref Range   Sodium 136 135 - 145 mmol/L   Potassium 3.7 3.5 - 5.1 mmol/L   Chloride 99 98 - 111 mmol/L   CO2 25 22 - 32 mmol/L   Glucose, Bld 155 (H) 70 - 99 mg/dL   BUN 24 (H) 8 - 23 mg/dL   Creatinine, Ser 1.07 (H) 0.44 - 1.00 mg/dL   Calcium 9.1 8.9 - 10.3 mg/dL   Total Protein 6.7 6.5 - 8.1 g/dL   Albumin 3.5 3.5 - 5.0 g/dL   AST 43 (H) 15 - 41 U/L   ALT 26 0 - 44 U/L   Alkaline Phosphatase 56 38 - 126 U/L   Total Bilirubin 1.1 0.3 - 1.2 mg/dL   GFR calc non Af Amer 46 (L) >60 mL/min   GFR calc Af Amer 53 (L) >60 mL/min    Anion gap 12 5 - 15  CBC  Result Value Ref Range   WBC 9.9 4.0 - 10.5 K/uL   RBC 3.88 3.87 - 5.11 MIL/uL   Hemoglobin 12.3 12.0 - 15.0 g/dL   HCT 37.4 36.0 - 46.0 %   MCV 96.4 80.0 - 100.0 fL   MCH 31.7 26.0 - 34.0 pg   MCHC 32.9 30.0 - 36.0 g/dL   RDW 13.4 11.5 - 15.5 %   Platelets 226 150 - 400 K/uL   nRBC 0.0 0.0 - 0.2 %  CK  Result Value Ref Range   Total CK 263 (H) 38 - 234 U/L  Urinalysis, Routine w reflex microscopic  Result Value Ref Range   Color, Urine COLORLESS (A) YELLOW   APPearance CLEAR CLEAR   Specific Gravity, Urine 1.005 1.005 - 1.030   pH 8.0 5.0 - 8.0   Glucose, UA NEGATIVE NEGATIVE mg/dL   Hgb urine dipstick SMALL (A) NEGATIVE   Bilirubin Urine NEGATIVE NEGATIVE   Ketones, ur NEGATIVE NEGATIVE mg/dL   Protein, ur NEGATIVE NEGATIVE mg/dL   Nitrite NEGATIVE NEGATIVE   Leukocytes,Ua NEGATIVE NEGATIVE   RBC / HPF 0-5 0 - 5 RBC/hpf   WBC, UA 0-5 0 - 5 WBC/hpf   Bacteria, UA NONE SEEN NONE SEEN   Squamous Epithelial / LPF 0-5 0 - 5   Hyaline Casts, UA PRESENT   Procalcitonin - Baseline  Result Value Ref Range   Procalcitonin <0.10 ng/mL  Procalcitonin  Result Value Ref Range   Procalcitonin <0.10 ng/mL  Legionella Pneumophila Serogp 1 Ur Ag  Result Value Ref Range   L. pneumophila Serogp 1 Ur Ag Negative Negative   Source of Sample URINE, RANDOM   Strep pneumoniae urinary antigen  Result Value Ref Range   Strep Pneumo Urinary Antigen NEGATIVE NEGATIVE  Mycoplasma pneumoniae antibody, IgM  Result Value Ref Range   Mycoplasma pneumo IgM <770 0 - 769 U/mL  QuantiFERON-TB Gold Plus  Result Value Ref Range   QuantiFERON Incubation Incubation performed.    QuantiFERON-TB Gold Plus Negative Negative  Brain natriuretic peptide  Result Value Ref Range   B Natriuretic Peptide 483.0 (H) 0.0 - 100.0 pg/mL  Procalcitonin  Result Value Ref Range   Procalcitonin <0.10 ng/mL  Basic metabolic panel  Result Value Ref Range   Sodium 137 135 - 145 mmol/L    Potassium 3.1 (L) 3.5 - 5.1 mmol/L   Chloride 99 98 - 111 mmol/L   CO2 25 22 - 32 mmol/L   Glucose, Bld 143 (H) 70 -  99 mg/dL   BUN 24 (H) 8 - 23 mg/dL   Creatinine, Ser 1.46 (H) 0.44 - 1.00 mg/dL   Calcium 8.4 (L) 8.9 - 10.3 mg/dL   GFR calc non Af Amer 31 (L) >60 mL/min   GFR calc Af Amer 36 (L) >60 mL/min   Anion gap 13 5 - 15  Magnesium  Result Value Ref Range   Magnesium 1.8 1.7 - 2.4 mg/dL  CBC  Result Value Ref Range   WBC 7.3 4.0 - 10.5 K/uL   RBC 3.58 (L) 3.87 - 5.11 MIL/uL   Hemoglobin 11.5 (L) 12.0 - 15.0 g/dL   HCT 34.5 (L) 36.0 - 46.0 %   MCV 96.4 80.0 - 100.0 fL   MCH 32.1 26.0 - 34.0 pg   MCHC 33.3 30.0 - 36.0 g/dL   RDW 13.2 11.5 - 15.5 %   Platelets 190 150 - 400 K/uL   nRBC 0.0 0.0 - 0.2 %  Lipid panel  Result Value Ref Range   Cholesterol 159 0 - 200 mg/dL   Triglycerides 60 <150 mg/dL   HDL 64 >40 mg/dL   Total CHOL/HDL Ratio 2.5 RATIO   VLDL 12 0 - 40 mg/dL   LDL Cholesterol 83 0 - 99 mg/dL  Hemoglobin A1c  Result Value Ref Range   Hgb A1c MFr Bld 5.3 4.8 - 5.6 %   Mean Plasma Glucose 105.41 mg/dL  Ferritin  Result Value Ref Range   Ferritin 55 11 - 307 ng/mL  Iron and TIBC  Result Value Ref Range   Iron 57 28 - 170 ug/dL   TIBC 263 250 - 450 ug/dL   Saturation Ratios 22 10.4 - 31.8 %   UIBC 206 ug/dL  Vitamin B12  Result Value Ref Range   Vitamin B-12 1,497 (H) 180 - 914 pg/mL  Folate RBC  Result Value Ref Range   Folate, Hemolysate 567.0 Not Estab. ng/mL   Hematocrit 31.9 (L) 34.0 - 46.6 %   Folate, RBC 1,777 >498 ng/mL  Basic metabolic panel  Result Value Ref Range   Sodium 137 135 - 145 mmol/L   Potassium 4.0 3.5 - 5.1 mmol/L   Chloride 99 98 - 111 mmol/L   CO2 29 22 - 32 mmol/L   Glucose, Bld 151 (H) 70 - 99 mg/dL   BUN 24 (H) 8 - 23 mg/dL   Creatinine, Ser 1.33 (H) 0.44 - 1.00 mg/dL   Calcium 8.6 (L) 8.9 - 10.3 mg/dL   GFR calc non Af Amer 35 (L) >60 mL/min   GFR calc Af Amer 41 (L) >60 mL/min   Anion gap 9 5 - 15    Magnesium  Result Value Ref Range   Magnesium 2.0 1.7 - 2.4 mg/dL  CBC  Result Value Ref Range   WBC 5.6 4.0 - 10.5 K/uL   RBC 3.33 (L) 3.87 - 5.11 MIL/uL   Hemoglobin 10.6 (L) 12.0 - 15.0 g/dL   HCT 32.4 (L) 36.0 - 46.0 %   MCV 97.3 80.0 - 100.0 fL   MCH 31.8 26.0 - 34.0 pg   MCHC 32.7 30.0 - 36.0 g/dL   RDW 13.5 11.5 - 15.5 %   Platelets 168 150 - 400 K/uL   nRBC 0.0 0.0 - 0.2 %  Occult blood card to lab, stool  Result Value Ref Range   Fecal Occult Bld POSITIVE (A) NEGATIVE  QuantiFERON-TB Gold Plus  Result Value Ref Range   QuantiFERON Criteria Comment    QuantiFERON TB1  Ag Value 0.05 IU/mL   QuantiFERON TB2 Ag Value 0.06 IU/mL   QuantiFERON Nil Value 0.05 IU/mL   QuantiFERON Mitogen Value 2.09 IU/mL  Basic metabolic panel  Result Value Ref Range   Sodium 138 135 - 145 mmol/L   Potassium 4.2 3.5 - 5.1 mmol/L   Chloride 102 98 - 111 mmol/L   CO2 27 22 - 32 mmol/L   Glucose, Bld 127 (H) 70 - 99 mg/dL   BUN 23 8 - 23 mg/dL   Creatinine, Ser 1.18 (H) 0.44 - 1.00 mg/dL   Calcium 9.0 8.9 - 10.3 mg/dL   GFR calc non Af Amer 41 (L) >60 mL/min   GFR calc Af Amer 47 (L) >60 mL/min   Anion gap 9 5 - 15  Magnesium  Result Value Ref Range   Magnesium 2.2 1.7 - 2.4 mg/dL  CBC  Result Value Ref Range   WBC 5.9 4.0 - 10.5 K/uL   RBC 3.36 (L) 3.87 - 5.11 MIL/uL   Hemoglobin 10.7 (L) 12.0 - 15.0 g/dL   HCT 32.9 (L) 36.0 - 46.0 %   MCV 97.9 80.0 - 100.0 fL   MCH 31.8 26.0 - 34.0 pg   MCHC 32.5 30.0 - 36.0 g/dL   RDW 13.5 11.5 - 15.5 %   Platelets 155 150 - 400 K/uL   nRBC 0.0 0.0 - 0.2 %  Basic metabolic panel  Result Value Ref Range   Sodium 135 135 - 145 mmol/L   Potassium 5.2 (H) 3.5 - 5.1 mmol/L   Chloride 101 98 - 111 mmol/L   CO2 25 22 - 32 mmol/L   Glucose, Bld 116 (H) 70 - 99 mg/dL   BUN 22 8 - 23 mg/dL   Creatinine, Ser 1.16 (H) 0.44 - 1.00 mg/dL   Calcium 8.8 (L) 8.9 - 10.3 mg/dL   GFR calc non Af Amer 41 (L) >60 mL/min   GFR calc Af Amer 48 (L) >60 mL/min    Anion gap 9 5 - 15  Magnesium  Result Value Ref Range   Magnesium 2.2 1.7 - 2.4 mg/dL  CBC  Result Value Ref Range   WBC 6.7 4.0 - 10.5 K/uL   RBC 3.35 (L) 3.87 - 5.11 MIL/uL   Hemoglobin 10.6 (L) 12.0 - 15.0 g/dL   HCT 32.6 (L) 36.0 - 46.0 %   MCV 97.3 80.0 - 100.0 fL   MCH 31.6 26.0 - 34.0 pg   MCHC 32.5 30.0 - 36.0 g/dL   RDW 13.5 11.5 - 15.5 %   Platelets 153 150 - 400 K/uL   nRBC 0.0 0.0 - 0.2 %  CBG monitoring, ED  Result Value Ref Range   Glucose-Capillary 109 (H) 70 - 99 mg/dL   Comment 1 Notify RN    Comment 2 Document in Chart   Troponin I (High Sensitivity)  Result Value Ref Range   Troponin I (High Sensitivity) 29 (H) <18 ng/L       Pertinent labs & imaging results that were available during my care of the patient were reviewed by me and considered in my medical decision making.  Assessment & Plan:  Prarthana was seen today for hospitalization follow-up.  Diagnoses and all orders for this visit:  Hospital discharge follow-up PAF (paroxysmal atrial fibrillation) ([place]) Acute CVA (cerebrovascular accident) ([place]) Hypokalemia Stage 3a chronic kidney disease Labs pending. Will repeat CXR at next visit. Referral to CCM. Keep follow ups with specialists.  -     CBC with Differential/Platelet -  CMP14+EGFR -     Referral to Chronic Care Management Services    Continue all other maintenance medications.  Follow up plan: Return in about 4 weeks (around 09/30/2019), or if symptoms worsen or fail to improve.  Continue healthy lifestyle choices, including diet (rich in fruits, vegetables, and lean proteins, and low in salt and simple carbohydrates) and exercise (at least 30 minutes of moderate physical activity daily).   The above assessment and management plan was discussed with the patient. The patient verbalized understanding of and has agreed to the management plan. Patient is aware to call the clinic if they develop any new symptoms or if symptoms persist or  worsen. Patient is aware when to return to the clinic for a follow-up visit. Patient educated on when it is appropriate to go to the emergency department.   Monia Pouch, FNP-C [facility] [phone]

## 2019-09-03 ENCOUNTER — Telehealth: Payer: Self-pay | Admitting: Family Medicine

## 2019-09-03 DIAGNOSIS — I130 Hypertensive heart and chronic kidney disease with heart failure and stage 1 through stage 4 chronic kidney disease, or unspecified chronic kidney disease: Secondary | ICD-10-CM | POA: Diagnosis not present

## 2019-09-03 DIAGNOSIS — I2510 Atherosclerotic heart disease of native coronary artery without angina pectoris: Secondary | ICD-10-CM | POA: Diagnosis not present

## 2019-09-03 DIAGNOSIS — J449 Chronic obstructive pulmonary disease, unspecified: Secondary | ICD-10-CM | POA: Diagnosis not present

## 2019-09-03 DIAGNOSIS — N183 Chronic kidney disease, stage 3 unspecified: Secondary | ICD-10-CM | POA: Diagnosis not present

## 2019-09-03 DIAGNOSIS — I69351 Hemiplegia and hemiparesis following cerebral infarction affecting right dominant side: Secondary | ICD-10-CM | POA: Diagnosis not present

## 2019-09-03 DIAGNOSIS — I509 Heart failure, unspecified: Secondary | ICD-10-CM | POA: Diagnosis not present

## 2019-09-03 LAB — CBC WITH DIFFERENTIAL/PLATELET
Basophils Absolute: 0.1 10*3/uL (ref 0.0–0.2)
Basos: 1 %
EOS (ABSOLUTE): 0.2 10*3/uL (ref 0.0–0.4)
Eos: 3 %
Hematocrit: 30.9 % — ABNORMAL LOW (ref 34.0–46.6)
Hemoglobin: 10.2 g/dL — ABNORMAL LOW (ref 11.1–15.9)
Immature Grans (Abs): 0 10*3/uL (ref 0.0–0.1)
Immature Granulocytes: 1 %
Lymphocytes Absolute: 1 10*3/uL (ref 0.7–3.1)
Lymphs: 15 %
MCH: 31.9 pg (ref 26.6–33.0)
MCHC: 33 g/dL (ref 31.5–35.7)
MCV: 97 fL (ref 79–97)
Monocytes Absolute: 0.8 10*3/uL (ref 0.1–0.9)
Monocytes: 12 %
Neutrophils Absolute: 4.5 10*3/uL (ref 1.4–7.0)
Neutrophils: 68 %
Platelets: 218 10*3/uL (ref 150–450)
RBC: 3.2 x10E6/uL — ABNORMAL LOW (ref 3.77–5.28)
RDW: 12.6 % (ref 11.7–15.4)
WBC: 6.5 10*3/uL (ref 3.4–10.8)

## 2019-09-03 LAB — CMP14+EGFR
ALT: 22 IU/L (ref 0–32)
AST: 29 IU/L (ref 0–40)
Albumin/Globulin Ratio: 1.2 (ref 1.2–2.2)
Albumin: 3.5 g/dL (ref 3.5–4.6)
Alkaline Phosphatase: 75 IU/L (ref 39–117)
BUN/Creatinine Ratio: 16 (ref 12–28)
BUN: 26 mg/dL (ref 10–36)
Bilirubin Total: 0.5 mg/dL (ref 0.0–1.2)
CO2: 26 mmol/L (ref 20–29)
Calcium: 9.4 mg/dL (ref 8.7–10.3)
Chloride: 98 mmol/L (ref 96–106)
Creatinine, Ser: 1.61 mg/dL — ABNORMAL HIGH (ref 0.57–1.00)
GFR calc Af Amer: 32 mL/min/{1.73_m2} — ABNORMAL LOW (ref >59)
GFR calc non Af Amer: 28 mL/min/{1.73_m2} — ABNORMAL LOW (ref >59)
Globulin, Total: 2.9 g/dL (ref 1.5–4.5)
Glucose: 97 mg/dL (ref 65–99)
Potassium: 4.7 mmol/L (ref 3.5–5.2)
Sodium: 137 mmol/L (ref 134–144)
Total Protein: 6.4 g/dL (ref 6.0–8.5)

## 2019-09-03 NOTE — Telephone Encounter (Signed)
Noted  

## 2019-09-03 NOTE — Telephone Encounter (Signed)
Returned missed call from [place] regarding lab results. Went over results with [name] per Dr Thayer Ohm notes. [name] voiced understanding.

## 2019-09-04 ENCOUNTER — Telehealth: Payer: Self-pay | Admitting: Family Medicine

## 2019-09-04 DIAGNOSIS — I2510 Atherosclerotic heart disease of native coronary artery without angina pectoris: Secondary | ICD-10-CM | POA: Diagnosis not present

## 2019-09-04 DIAGNOSIS — I130 Hypertensive heart and chronic kidney disease with heart failure and stage 1 through stage 4 chronic kidney disease, or unspecified chronic kidney disease: Secondary | ICD-10-CM | POA: Diagnosis not present

## 2019-09-04 DIAGNOSIS — I69351 Hemiplegia and hemiparesis following cerebral infarction affecting right dominant side: Secondary | ICD-10-CM | POA: Diagnosis not present

## 2019-09-04 DIAGNOSIS — J449 Chronic obstructive pulmonary disease, unspecified: Secondary | ICD-10-CM | POA: Diagnosis not present

## 2019-09-04 DIAGNOSIS — I509 Heart failure, unspecified: Secondary | ICD-10-CM | POA: Diagnosis not present

## 2019-09-04 DIAGNOSIS — N183 Chronic kidney disease, stage 3 unspecified: Secondary | ICD-10-CM | POA: Diagnosis not present

## 2019-09-04 NOTE — Chronic Care Management (AMB) (Signed)
  Chronic Care Management   Note  09/04/2019 Name: [name] MRN: [id number] DOB: 06/04/1927  Crystal Cooper is a 86 y.o. year old female who is a primary care patient of Rakes, Connye Burkitt, FNP. Crystal Cooper is currently enrolled in care management services. An additional referral for LCSW was placed.   Follow up plan: Telephone appointment with care management team member scheduled for:09/11/2019  Glenna Durand, LPN Health Advisor, [facility] ??[email or web address] ??[phone]

## 2019-09-07 ENCOUNTER — Telehealth: Payer: Self-pay | Admitting: Family Medicine

## 2019-09-07 NOTE — Telephone Encounter (Signed)
Left message to please call our office to schedule an appointment with your provider.

## 2019-09-07 NOTE — Telephone Encounter (Signed)
noted 

## 2019-09-08 ENCOUNTER — Encounter: Payer: Self-pay | Admitting: Family Medicine

## 2019-09-08 ENCOUNTER — Other Ambulatory Visit: Payer: Self-pay

## 2019-09-08 ENCOUNTER — Ambulatory Visit (INDEPENDENT_AMBULATORY_CARE_PROVIDER_SITE_OTHER): Payer: Medicare Other | Admitting: Family Medicine

## 2019-09-08 VITALS — BP 136/72 | HR 60 | Temp 97.3°F | Resp 20 | Ht 62.0 in | Wt 109.0 lb

## 2019-09-08 DIAGNOSIS — K625 Hemorrhage of anus and rectum: Secondary | ICD-10-CM | POA: Diagnosis not present

## 2019-09-08 LAB — CBC WITH DIFFERENTIAL/PLATELET
Basophils Absolute: 0.1 10*3/uL (ref 0.0–0.2)
Basos: 1 %
EOS (ABSOLUTE): 0.1 10*3/uL (ref 0.0–0.4)
Eos: 2 %
Hematocrit: 29.2 % — ABNORMAL LOW (ref 34.0–46.6)
Hemoglobin: 9.9 g/dL — ABNORMAL LOW (ref 11.1–15.9)
Immature Grans (Abs): 0 10*3/uL (ref 0.0–0.1)
Immature Granulocytes: 0 %
Lymphocytes Absolute: 1.1 10*3/uL (ref 0.7–3.1)
Lymphs: 14 %
MCH: 32.4 pg (ref 26.6–33.0)
MCHC: 33.9 g/dL (ref 31.5–35.7)
MCV: 95 fL (ref 79–97)
Monocytes Absolute: 0.6 10*3/uL (ref 0.1–0.9)
Monocytes: 7 %
Neutrophils Absolute: 6.1 10*3/uL (ref 1.4–7.0)
Neutrophils: 76 %
Platelets: 281 10*3/uL (ref 150–450)
RBC: 3.06 x10E6/uL — ABNORMAL LOW (ref 3.77–5.28)
RDW: 12.6 % (ref 11.7–15.4)
WBC: 8 10*3/uL (ref 3.4–10.8)

## 2019-09-08 LAB — HEMOGLOBIN, FINGERSTICK: Hemoglobin: 9.4 g/dL — ABNORMAL LOW (ref 11.1–15.9)

## 2019-09-08 NOTE — Progress Notes (Signed)
Subjective:  Patient ID: Crystal Cooper, female    DOB: March 11, 1927, 86 y.o.   MRN: [id number]  Patient Care Team: Baruch Gouty, FNP as PCP - General (Family Medicine) Ander Slade, Carlisle Beers, MD as Referring Physician (Ophthalmology) Stanford Breed Denice Bors, MD as Consulting Physician (Cardiology) Constance Haw, MD as Consulting Physician (Cardiology) Ilean China, RN as Registered Nurse (General Practice)   Chief Complaint:  Rectal Bleeding (slight amt = x 1 week )   HPI: Crystal Cooper is a 86 y.o. female presenting on 09/08/2019 for Rectal Bleeding (slight amt = x 1 week )   1. Bright red rectal bleeding Pts daughter reports noticing a minimal amount of bright red blood on incontinence pad and on rectum. States she has noticed this over the last week. Denies blood in commode on in stool. No dark or tarry stools. Pt denies weakness, shortness of breath, palpitations, or dizziness. Pt was recently restarted on Eliquis due to CVA. Pt was stopped prior to CVA due to dropping Hgb. Her last Hgb after restarting the Eliquis was 10.2 on 09/02/2019.    Relevant past medical, surgical, family, and social history reviewed and updated as indicated.  Allergies and medications reviewed and updated. Date reviewed: Chart in Epic.   Past Medical History:  Diagnosis Date  . Anemia    years ago  . Aortic stenosis   . Arthritis   . Asthma   . Atrial fibrillation ([place])   . CHF (congestive heart failure) ([place]) 11/2014  . Family history of adverse reaction to anesthesia    2 daughters would have N/V  . Glaucoma   . Hearing loss   . Heart murmur   . HTN (hypertension)   . Pneumonia   . Prolapsing mitral leaflet syndrome   . Shortness of breath dyspnea    with exertion  . SVT (supraventricular tachycardia) (H[facility])    S/P ablation of AVNRT in 2003    Past Surgical History:  Procedure Laterality Date  . APPENDECTOMY    . BIOPSY  04/07/2018   Procedure: BIOPSY;  Surgeon: Jerene Bears,  MD;  Location: [facility] ENDOSCOPY;  Service: Gastroenterology;;  . BLADDER SURGERY    . CARDIAC CATHETERIZATION N/A 09/26/2015   Procedure: Right/Left Heart Cath and Coronary Angiography;  Surgeon: Burnell Blanks, MD;  Location: [place] CV LAB;  Service: Cardiovascular;  Laterality: N/A;  . CARDIAC SURGERY    . CARDIOVERSION N/A 03/02/2016   Procedure: CARDIOVERSION;  Surgeon: Thayer Headings, MD;  Location: [place];  Service: Cardiovascular;  Laterality: N/A;  . EP IMPLANTABLE DEVICE N/A 10/26/2015   Procedure: Pacemaker Implant;  Surgeon: Will Meredith Leeds, MD;  Location: [place] [facility] LAB;  Service: Cardiovascular;  Laterality: N/A;  . ESOPHAGOGASTRODUODENOSCOPY (EGD) WITH PROPOFOL N/A 04/07/2018   Procedure: ESOPHAGOGASTRODUODENOSCOPY (EGD) WITH PROPOFOL;  Surgeon: Jerene Bears, MD;  Location: [facility] ENDOSCOPY;  Service: Gastroenterology;  Laterality: N/A;  . EYE SURGERY Bilateral    cataract surgery  . HOT HEMOSTASIS N/A 04/07/2018   Procedure: HOT HEMOSTASIS (ARGON PLASMA COAGULATION/BICAP);  Surgeon: Jerene Bears, MD;  Location: [facility] ENDOSCOPY;  Service: Gastroenterology;  Laterality: N/A;  . NASAL SINUS SURGERY    . TEE WITHOUT CARDIOVERSION N/A 10/25/2015   Procedure: TRANSESOPHAGEAL ECHOCARDIOGRAM (TEE);  Surgeon: Burnell Blanks, MD;  Location: [place];  Service: Open Heart Surgery;  Laterality: N/A;  . TRANSCATHETER AORTIC VALVE REPLACEMENT, TRANSFEMORAL Right 10/25/2015   Procedure: TRANSCATHETER AORTIC VALVE REPLACEMENT, TRANSFEMORAL;  Surgeon: Burnell Blanks, MD;  Location: [place];  Service: Open Heart Surgery;  Laterality: Right;    Social History   Socioeconomic History  . Marital status: Widowed    Spouse name: Not on file  . Number of children: 3  . Years of education: Not on file  . Highest education level: 12th grade  Occupational History  . Occupation: Retired  Tobacco Use  . Smoking status: Never Smoker  . Smokeless tobacco: Never Used  Substance  and Sexual Activity  . Alcohol use: No    Alcohol/week: 0.0 standard drinks  . Drug use: No  . Sexual activity: Never  Other Topics Concern  . Not on file  Social History Narrative  . Not on file   Social Determinants of Health   Financial Resource Strain:   . Difficulty of Paying Living Expenses:   Food Insecurity: No Food Insecurity  . Worried About Charity fundraiser in the Last Year: Never true  . Ran Out of Food in the Last Year: Never true  Transportation Needs: No Transportation Needs  . Lack of Transportation (Medical): No  . Lack of Transportation (Non-Medical): No  Physical Activity: Inactive  . Days of Exercise per Week: 0 days  . Minutes of Exercise per Session: 0 min  Stress: No Stress Concern Present  . Feeling of Stress : Not at all  Social Connections: Slightly Isolated  . Frequency of Communication with Friends and Family: More than three times a week  . Frequency of Social Gatherings with Friends and Family: More than three times a week  . Attends Religious Services: More than 4 times per year  . Active Member of Clubs or Organizations: Yes  . Attends Archivist Meetings: More than 4 times per year  . Marital Status: Widowed  Intimate Partner Violence: Not At Risk  . Fear of Current or Ex-Partner: No  . Emotionally Abused: No  . Physically Abused: No  . Sexually Abused: No    Outpatient Encounter Medications as of 09/08/2019  Medication Sig  . acetaminophen (TYLENOL) 500 MG tablet Take 500 mg by mouth every 6 (six) hours as needed for mild pain or headache.   . albuterol (PROAIR HFA) 108 (90 Base) MCG/ACT inhaler Inhale 2 puffs into the lungs every 4 (four) hours as needed for wheezing or shortness of breath.  [name] apixaban (ELIQUIS) 2.5 MG TABS tablet Take 1 tablet (2.5 mg total) by mouth 2 (two) times daily.  . Biotin 10 MG CAPS Take 10 mg by mouth daily.   . Calcium Carb-Cholecalciferol (CALTRATE 600+D3) 600-800 MG-UNIT TABS Take 1 tablet by  mouth daily.  [name] docusate sodium (COLACE) 100 MG capsule Take 100 mg by mouth daily as needed for mild constipation.  . enalapril (VASOTEC) 2.5 MG tablet Take 2.5 mg by mouth daily.  . ferrous sulfate 325 (65 FE) MG tablet Take 1 tablet (325 mg total) by mouth 2 (two) times daily with a meal.  . furosemide (LASIX) 40 MG tablet Take 1.5 tablets (60 mg total) by mouth daily.  . metoprolol tartrate (LOPRESSOR) 25 MG tablet Take 1 tablet (25 mg total) by mouth 2 (two) times daily.  . multivitamin-iron-minerals-folic acid (CENTRUM) chewable tablet Chew 1 tablet by mouth daily.  . pantoprazole (PROTONIX) 40 MG tablet Take 1 tablet (40 mg total) by mouth 2 (two) times daily before a meal.   No facility-administered encounter medications on file as of 09/08/2019.    Allergies  Allergen Reactions  .  Hctz [Hydrochlorothiazide] Other (See Comments)    Pt was ill and this affected her kidneys   . Aspirin Other (See Comments)    Cardiologist said the patient is to not take this  . Codeine Other (See Comments)    Made the patient feel ill, has not had any problems since 1977    Review of Systems  Constitutional: Negative for activity change, appetite change, chills, diaphoresis, fatigue, fever and unexpected weight change.  HENT: Negative.   Eyes: Negative.  Negative for photophobia and visual disturbance.  Respiratory: Negative for cough, chest tightness and shortness of breath.   Cardiovascular: Negative for chest pain, palpitations and leg swelling.  Gastrointestinal: Positive for anal bleeding (minimal bright red blood on incontinence pad). Negative for abdominal distention, abdominal pain, blood in stool, constipation, diarrhea, nausea and vomiting.  Endocrine: Negative.   Genitourinary: Negative for decreased urine volume, difficulty urinating, dysuria, frequency, hematuria, urgency and vaginal bleeding.  Musculoskeletal: Positive for gait problem. Negative for arthralgias and myalgias.  Skin:  Negative.   Allergic/Immunologic: Negative.   Neurological: Positive for weakness (generalized, has greatly improved). Negative for dizziness, tremors, seizures, syncope, facial asymmetry, light-headedness, numbness and headaches.  Hematological: Negative.   Psychiatric/Behavioral: Negative for confusion, hallucinations, sleep disturbance and suicidal ideas.  All other systems reviewed and are negative.       Objective:  BP 136/72   Pulse 60   Temp (!) 97.3 F (36.3 C)   Resp 20   Ht _0  (1.575 m)   Wt 109 lb (49.4 kg)   SpO2 97%   BMI 19.94 kg/m    Wt Readings from Last 3 Encounters:  09/08/19 109 lb (49.4 kg)  09/02/19 109 lb (49.4 kg)  08/25/19 109 lb (49.4 kg)    Physical Exam Vitals and nursing note reviewed.  Constitutional:      General: She is not in acute distress.    Appearance: Normal appearance. She is well-developed, well-groomed and normal weight. She is not ill-appearing, toxic-appearing or diaphoretic.  HENT:     Head: Normocephalic and atraumatic.     Jaw: There is normal jaw occlusion.     Right Ear: Hearing normal.     Left Ear: Hearing normal.     Nose: Nose normal.     Mouth/Throat:     Lips: Pink.     Mouth: Mucous membranes are moist.     Pharynx: Oropharynx is clear. Uvula midline.  Eyes:     General: Lids are normal.     Extraocular Movements: Extraocular movements intact.     Conjunctiva/sclera: Conjunctivae normal.     Pupils: Pupils are equal, round, and reactive to light.  Neck:     Thyroid: No thyroid mass, thyromegaly or thyroid tenderness.     Vascular: No carotid bruit or JVD.     Trachea: Trachea and phonation normal.  Cardiovascular:     Rate and Rhythm: Normal rate. Rhythm irregular.     Chest Wall: PMI is not displaced.     Pulses: Normal pulses.     Heart sounds: Normal heart sounds. No murmur. No friction rub. No gallop.   Pulmonary:     Effort: Pulmonary effort is normal. No respiratory distress.     Breath sounds:  Normal breath sounds. No wheezing or rhonchi.  Abdominal:     General: Bowel sounds are normal. There is no distension or abdominal bruit.     Palpations: Abdomen is soft. There is no hepatomegaly or splenomegaly.  Tenderness: There is no abdominal tenderness. There is no right CVA tenderness or left CVA tenderness.     Hernia: No hernia is present.  Musculoskeletal:        General: Normal range of motion.     Cervical back: Normal range of motion and neck supple.     Right lower leg: No edema.     Left lower leg: No edema.  Lymphadenopathy:     Cervical: No cervical adenopathy.  Skin:    General: Skin is warm and dry.     Capillary Refill: Capillary refill takes less than 2 seconds.     Coloration: Skin is pale. Skin is not cyanotic or jaundiced.     Findings: No rash.  Neurological:     General: No focal deficit present.     Mental Status: She is alert and oriented to person, place, and time.     Cranial Nerves: Cranial nerves are intact.     Sensory: Sensation is intact. No sensory deficit.     Motor: Weakness (generalized, greatly improved) present.     Coordination: Coordination is intact. Coordination normal.     Gait: Gait abnormal (in wheelchair).     Deep Tendon Reflexes: Reflexes are normal and symmetric. Reflexes normal.  Psychiatric:        Attention and Perception: Attention and perception normal.        Mood and Affect: Mood and affect normal.        Speech: Speech normal.        Behavior: Behavior normal. Behavior is cooperative.        Thought Content: Thought content normal.        Cognition and Memory: Cognition and memory normal.        Judgment: Judgment normal.     Results for orders placed or performed in visit on 09/02/19  CBC with Differential/Platelet  Result Value Ref Range   WBC 6.5 3.4 - 10.8 x10E3/uL   RBC 3.20 (L) 3.77 - 5.28 x10E6/uL   Hemoglobin 10.2 (L) 11.1 - 15.9 g/dL   Hematocrit 30.9 (L) 34.0 - 46.6 %   MCV 97 79 - 97 fL   MCH 31.9  26.6 - 33.0 pg   MCHC 33.0 31.5 - 35.7 g/dL   RDW 12.6 11.7 - 15.4 %   Platelets 218 150 - 450 x10E3/uL   Neutrophils 68 Not Estab. %   Lymphs 15 Not Estab. %   Monocytes 12 Not Estab. %   Eos 3 Not Estab. %   Basos 1 Not Estab. %   Neutrophils Absolute 4.5 1.4 - 7.0 x10E3/uL   Lymphocytes Absolute 1.0 0.7 - 3.1 x10E3/uL   Monocytes Absolute 0.8 0.1 - 0.9 x10E3/uL   EOS (ABSOLUTE) 0.2 0.0 - 0.4 x10E3/uL   Basophils Absolute 0.1 0.0 - 0.2 x10E3/uL   Immature Granulocytes 1 Not Estab. %   Immature Grans (Abs) 0.0 0.0 - 0.1 x10E3/uL  CMP14+EGFR  Result Value Ref Range   Glucose 97 65 - 99 mg/dL   BUN 26 10 - 36 mg/dL   Creatinine, Ser 1.61 (H) 0.57 - 1.00 mg/dL   GFR calc non Af Amer 28 (L) >59 mL/min/1.73   GFR calc Af Amer 32 (L) >59 mL/min/1.73   BUN/Creatinine Ratio 16 12 - 28   Sodium 137 134 - 144 mmol/L   Potassium 4.7 3.5 - 5.2 mmol/L   Chloride 98 96 - 106 mmol/L   CO2 26 20 - 29 mmol/L   Calcium 9.4  8.7 - 10.3 mg/dL   Total Protein 6.4 6.0 - 8.5 g/dL   Albumin 3.5 3.5 - 4.6 g/dL   Globulin, Total 2.9 1.5 - 4.5 g/dL   Albumin/Globulin Ratio 1.2 1.2 - 2.2   Bilirubin Total 0.5 0.0 - 1.2 mg/dL   Alkaline Phosphatase 75 39 - 117 IU/L   AST 29 0 - 40 IU/L   ALT 22 0 - 32 IU/L       Pertinent labs & imaging results that were available during my care of the patient were reviewed by me and considered in my medical decision making.  Assessment & Plan:  Crystal Cooper was seen today for rectal bleeding.  Diagnoses and all orders for this visit:  Bright red rectal bleeding Minimal bright red blood on incontinence pad. None in commode or on stool. No weakness, dizziness, palpitations, or syncope. Fingerstick Hgb 9.4 in office but lab reported this was difficult to obtain so stat CBC was sent. Will notify pt once this results and determine treatment plans. Risks and benefits of Eliquis therapy discussed in detail again today. Will discuss further once labs have resulted.  -      Hemoglobin, fingerstick -     CBC with Differential/Platelet     Continue all other maintenance medications.  Follow up plan: Return in about 1 week (around 09/15/2019), or if symptoms worsen or fail to improve, for CBC.  Continue healthy lifestyle choices, including diet (rich in fruits, vegetables, and lean proteins, and low in salt and simple carbohydrates) and exercise (at least 30 minutes of moderate physical activity daily).   The above assessment and management plan was discussed with the patient. The patient verbalized understanding of and has agreed to the management plan. Patient is aware to call the clinic if they develop any new symptoms or if symptoms persist or worsen. Patient is aware when to return to the clinic for a follow-up visit. Patient educated on when it is appropriate to go to the emergency department.   Monia Pouch, FNP-C [facility] [phone]

## 2019-09-08 NOTE — Patient Instructions (Signed)
Rectal Bleeding  Rectal bleeding is when blood comes out of the opening of the butt (anus). People with this kind of bleeding may notice bright red blood in their underwear or in the toilet after they poop (have a bowel movement). They may also have dark red or black poop (stool). Rectal bleeding is often a sign that something is wrong. It needs to be checked by a doctor. Follow these instructions at home: Watch for any changes in your condition. Take these actions to help with bleeding and discomfort:  Eat a diet that is high in fiber. This will keep your poop soft so it is easier for you to poop without pushing too hard. Ask your doctor to tell you what foods and drinks are high in fiber.  Drink enough fluid to keep your pee (urine) clear or pale yellow. This also helps keep your poop soft.  Try taking a warm bath. This may help with pain.  Keep all follow-up visits as told by your doctor. This is important. Get help right away if:  You have new bleeding.  You have more bleeding than before.  You have black or dark red poop.  You throw up (vomit) blood or something that looks like coffee grounds.  You have pain or tenderness in your belly (abdomen).  You have a fever.  You feel weak.  You feel sick to your stomach (nauseous).  You pass out (faint).  You have very bad pain in your butt.  You cannot poop. This information is not intended to replace advice given to you by your health care provider. Make sure you discuss any questions you have with your health care provider. Document Revised: 05/24/2017 Document Reviewed: 08/07/2015 Elsevier Patient Education  2020 [employer]  

## 2019-09-09 ENCOUNTER — Ambulatory Visit (INDEPENDENT_AMBULATORY_CARE_PROVIDER_SITE_OTHER)
Admission: RE | Admit: 2019-09-09 | Discharge: 2019-09-09 | Disposition: A | Discharge status: Home / Self Care | Payer: Medicare Other | Source: Ambulatory Visit | Attending: Gastroenterology | Admitting: Gastroenterology

## 2019-09-09 ENCOUNTER — Ambulatory Visit (INDEPENDENT_AMBULATORY_CARE_PROVIDER_SITE_OTHER): Payer: Medicare Other | Admitting: Gastroenterology

## 2019-09-09 ENCOUNTER — Encounter: Payer: Self-pay | Admitting: Gastroenterology

## 2019-09-09 VITALS — BP 90/50 | HR 55 | Temp 98.3°F | Ht 62.0 in | Wt 111.0 lb

## 2019-09-09 DIAGNOSIS — D649 Anemia, unspecified: Secondary | ICD-10-CM | POA: Diagnosis not present

## 2019-09-09 DIAGNOSIS — R0689 Other abnormalities of breathing: Secondary | ICD-10-CM | POA: Insufficient documentation

## 2019-09-09 DIAGNOSIS — R05 Cough: Secondary | ICD-10-CM | POA: Diagnosis not present

## 2019-09-09 DIAGNOSIS — I639 Cerebral infarction, unspecified: Secondary | ICD-10-CM

## 2019-09-09 DIAGNOSIS — K31819 Angiodysplasia of stomach and duodenum without bleeding: Secondary | ICD-10-CM | POA: Diagnosis not present

## 2019-09-09 DIAGNOSIS — R195 Other fecal abnormalities: Secondary | ICD-10-CM

## 2019-09-09 NOTE — Patient Instructions (Addendum)
If you are age 84 or older, your body mass index should be between 23-30. Your Body mass index is 20.3 kg/m. If this is out of the aforementioned range listed, please consider follow up with your Primary Care Provider.  If you are age 9 or younger, your body mass index should be between 19-25. Your Body mass index is 20.3 kg/m. If this is out of the aformentioned range listed, please consider follow up with your Primary Care Provider.   Go downstairs for chest x-ray today.  Press "B" on the elevator. The x-ray is located at the first door on the right as you exit the elevator.

## 2019-09-09 NOTE — Progress Notes (Addendum)
09/09/2019 Crystal Cooper [id number] Aug 16, 1926   HISTORY OF PRESENT ILLNESS: This is a 86 year old female who is a patient of Dr. Vena Rua.  She is here for for hospital follow-up with her daughter.  Recently hospitalized with acute CVA and gastrointestinal bleeding/anemia/heme positive stools.  No procedures were performed as there was no sign of significant bleeding and her hemoglobin remained stable.  She is very high risk for procedures.  She is on Eliquis for atrial fibrillation.  Plan was to restart that and monitor/observe.  Her daughter tells me that they have only seen very small streaks of red blood on her pad.  Stools are dark from her twice daily iron supplements and have been like that at baseline.  She has been anemic and iron deficient in the past and her daughter recalls her mother receiving IV iron infusions in the remote past as well.  During her recent hospital stay iron studies were actually normal.  Hemoglobin yesterday at PCPs office was stable at 9.9 g as compared to 10.2 g one week ago.  Last colonoscopy was in 2008 with a few diverticula, one hyperplastic and one serrated adenomatous polyp.  October 2019 EGD with Schatzki's ring, hiatal hernia, nonbleeding gastritis, duodenitis, eradication nonbleeding duodenal AVM.   Past Medical History:  Diagnosis Date  . Anemia    years ago  . Aortic stenosis   . Arthritis   . Asthma   . Atrial fibrillation ([place])   . CHF (congestive heart failure) ([place]) 11/2014  . Family history of adverse reaction to anesthesia    2 daughters would have N/V  . Glaucoma   . Hearing loss   . Heart murmur   . HTN (hypertension)   . Pneumonia   . Prolapsing mitral leaflet syndrome   . Shortness of breath dyspnea    with exertion  . SVT (supraventricular tachycardia) ([facility])    S/P ablation of AVNRT in 2003   Past Surgical History:  Procedure Laterality Date  . APPENDECTOMY    . BIOPSY  04/07/2018   Procedure: BIOPSY;  Surgeon:  Jerene Bears, MD;  Location: [facility] ENDOSCOPY;  Service: Gastroenterology;;  . BLADDER SURGERY    . CARDIAC CATHETERIZATION N/A 09/26/2015   Procedure: Right/Left Heart Cath and Coronary Angiography;  Surgeon: Burnell Blanks, MD;  Location: [place] CV LAB;  Service: Cardiovascular;  Laterality: N/A;  . CARDIAC SURGERY    . CARDIOVERSION N/A 03/02/2016   Procedure: CARDIOVERSION;  Surgeon: Thayer Headings, MD;  Location: [place];  Service: Cardiovascular;  Laterality: N/A;  . EP IMPLANTABLE DEVICE N/A 10/26/2015   Procedure: Pacemaker Implant;  Surgeon: Will Meredith Leeds, MD;  Location: [place] CV LAB;  Service: Cardiovascular;  Laterality: N/A;  . ESOPHAGOGASTRODUODENOSCOPY (EGD) WITH PROPOFOL N/A 04/07/2018   Procedure: ESOPHAGOGASTRODUODENOSCOPY (EGD) WITH PROPOFOL;  Surgeon: Jerene Bears, MD;  Location: [facility] ENDOSCOPY;  Service: Gastroenterology;  Laterality: N/A;  . EYE SURGERY Bilateral    cataract surgery  . HOT HEMOSTASIS N/A 04/07/2018   Procedure: HOT HEMOSTASIS (ARGON PLASMA COAGULATION/BICAP);  Surgeon: Jerene Bears, MD;  Location: [facility] ENDOSCOPY;  Service: Gastroenterology;  Laterality: N/A;  . NASAL SINUS SURGERY    . TEE WITHOUT CARDIOVERSION N/A 10/25/2015   Procedure: TRANSESOPHAGEAL ECHOCARDIOGRAM (TEE);  Surgeon: Burnell Blanks, MD;  Location: [place];  Service: Open Heart Surgery;  Laterality: N/A;  . TRANSCATHETER AORTIC VALVE REPLACEMENT, TRANSFEMORAL Right 10/25/2015   Procedure: TRANSCATHETER AORTIC VALVE REPLACEMENT, TRANSFEMORAL;  Surgeon: Annita Brod  Angelena Form, MD;  Location: [place] [facility];  Service: Open Heart Surgery;  Laterality: Right;    reports that she has never smoked. She has never used smokeless tobacco. She reports that she does not drink alcohol or use drugs. family history includes Hypertension in her mother; Pneumonia in her father. Allergies  Allergen Reactions  . Hctz [Hydrochlorothiazide] Other (See Comments)    Pt was ill and this affected  her kidneys   . Aspirin Other (See Comments)    Cardiologist said the patient is to not take this  . Codeine Other (See Comments)    Made the patient feel ill, has not had any problems since 1977      Outpatient Encounter Medications as of 09/09/2019  Medication Sig  . acetaminophen (TYLENOL) 500 MG tablet Take 500 mg by mouth every 6 (six) hours as needed for mild pain or headache.   . albuterol (PROAIR HFA) 108 (90 Base) MCG/ACT inhaler Inhale 2 puffs into the lungs every 4 (four) hours as needed for wheezing or shortness of breath.  [name] apixaban (ELIQUIS) 2.5 MG TABS tablet Take 1 tablet (2.5 mg total) by mouth 2 (two) times daily.  . Biotin 10 MG CAPS Take 10 mg by mouth daily.   . Calcium Carb-Cholecalciferol (CALTRATE 600+D3) 600-800 MG-UNIT TABS Take 1 tablet by mouth daily.  [name] docusate sodium (COLACE) 100 MG capsule Take 100 mg by mouth daily as needed for mild constipation.  . enalapril (VASOTEC) 2.5 MG tablet Take 2.5 mg by mouth daily.  . ferrous sulfate 325 (65 FE) MG tablet Take 1 tablet (325 mg total) by mouth 2 (two) times daily with a meal.  . furosemide (LASIX) 40 MG tablet Take 1.5 tablets (60 mg total) by mouth daily.  . metoprolol tartrate (LOPRESSOR) 25 MG tablet Take 1 tablet (25 mg total) by mouth 2 (two) times daily.  . multivitamin-iron-minerals-folic acid (CENTRUM) chewable tablet Chew 1 tablet by mouth daily.  . pantoprazole (PROTONIX) 40 MG tablet Take 1 tablet (40 mg total) by mouth 2 (two) times daily before a meal.   No facility-administered encounter medications on file as of 09/09/2019.     REVIEW OF SYSTEMS  : All other systems reviewed and negative except where noted in the History of Present Illness.   PHYSICAL EXAM: BP (!) 90/50   Pulse (!) 55   Temp 98.3 F (36.8 C)   Ht 5\' 2"  (1.575 m)   Wt 111 lb (50.3 kg)   BMI 20.30 kg/m  General: Well developed white female in no acute distress; frail Head: Normocephalic and atraumatic Eyes:  Sclerae  anicteric, conjunctiva pink. Ears: Normal auditory acuity Lungs: Course BS noted B/L.  No increased WOB. Heart: Regular rate and rhythm; no M/R/G. Abdomen: Soft, non-distended.  BS present  Non-tender. Musculoskeletal: Symmetrical with no gross deformities  Skin: No lesions on visible extremities Extremities: No edema  Neurological: Alert oriented x 4, grossly nonfocal Psychological:  Alert and cooperative. Normal mood and affect  ASSESSMENT AND PLAN: *Normocytic anemia and Hemoccult positive stools in the setting of anticoagulation with Eliquis.  Hemoglobin checked at PCPs office yesterday was stable.  No plans for endoscopic evaluation unless significant drop in hemoglobin in the setting of significant bleeding.  She is very high risk for any procedures.  PCP is already planning for repeat CBC next week.  If stable could probably repeat every 2 weeks for a while and if continues to remain stable then monthly. *Acute CVA earlier this month *Atrial fibrillation  on anticoagulation with Eliquis.  Plan was to restart the Eliquis at her hospitalization and observe. *History of small duodenal AVM with eradication in 2019 *Abnormal breath sounds on today's exam.  Was diagnosed with pneumonia during her hospital stay, but looks like she was only treated with very short course of antibiotics.  She does not appear in respiratory distress.  Discussed with daughter.  We will repeat two-view chest x-ray today for follow-up.  I did advise that I would forward this to the patient's PCP.  **Follow-up here prn.   CC:  Baruch Gouty, FNP   Addendum: Reviewed and agree with assessment and management plan. Pyrtle, Lajuan Lines, MD

## 2019-09-10 ENCOUNTER — Other Ambulatory Visit: Payer: Self-pay

## 2019-09-10 ENCOUNTER — Ambulatory Visit (INDEPENDENT_AMBULATORY_CARE_PROVIDER_SITE_OTHER): Payer: Medicare Other

## 2019-09-10 DIAGNOSIS — Z8701 Personal history of pneumonia (recurrent): Secondary | ICD-10-CM

## 2019-09-10 DIAGNOSIS — Z9181 History of falling: Secondary | ICD-10-CM

## 2019-09-10 DIAGNOSIS — M4802 Spinal stenosis, cervical region: Secondary | ICD-10-CM

## 2019-09-10 DIAGNOSIS — J449 Chronic obstructive pulmonary disease, unspecified: Secondary | ICD-10-CM

## 2019-09-10 DIAGNOSIS — Z950 Presence of cardiac pacemaker: Secondary | ICD-10-CM

## 2019-09-10 DIAGNOSIS — I69351 Hemiplegia and hemiparesis following cerebral infarction affecting right dominant side: Secondary | ICD-10-CM

## 2019-09-10 DIAGNOSIS — I495 Sick sinus syndrome: Secondary | ICD-10-CM | POA: Diagnosis not present

## 2019-09-10 DIAGNOSIS — K31819 Angiodysplasia of stomach and duodenum without bleeding: Secondary | ICD-10-CM

## 2019-09-10 DIAGNOSIS — H9190 Unspecified hearing loss, unspecified ear: Secondary | ICD-10-CM | POA: Diagnosis not present

## 2019-09-10 DIAGNOSIS — G9340 Encephalopathy, unspecified: Secondary | ICD-10-CM | POA: Diagnosis not present

## 2019-09-10 DIAGNOSIS — I130 Hypertensive heart and chronic kidney disease with heart failure and stage 1 through stage 4 chronic kidney disease, or unspecified chronic kidney disease: Secondary | ICD-10-CM | POA: Diagnosis not present

## 2019-09-10 DIAGNOSIS — I700 Atherosclerosis of aorta: Secondary | ICD-10-CM

## 2019-09-10 DIAGNOSIS — M47812 Spondylosis without myelopathy or radiculopathy, cervical region: Secondary | ICD-10-CM

## 2019-09-10 DIAGNOSIS — Z7901 Long term (current) use of anticoagulants: Secondary | ICD-10-CM

## 2019-09-10 DIAGNOSIS — N183 Chronic kidney disease, stage 3 unspecified: Secondary | ICD-10-CM | POA: Diagnosis not present

## 2019-09-10 DIAGNOSIS — Z8719 Personal history of other diseases of the digestive system: Secondary | ICD-10-CM

## 2019-09-10 DIAGNOSIS — E041 Nontoxic single thyroid nodule: Secondary | ICD-10-CM

## 2019-09-10 DIAGNOSIS — I509 Heart failure, unspecified: Secondary | ICD-10-CM | POA: Diagnosis not present

## 2019-09-10 DIAGNOSIS — I2510 Atherosclerotic heart disease of native coronary artery without angina pectoris: Secondary | ICD-10-CM | POA: Diagnosis not present

## 2019-09-10 DIAGNOSIS — G319 Degenerative disease of nervous system, unspecified: Secondary | ICD-10-CM | POA: Diagnosis not present

## 2019-09-10 DIAGNOSIS — I480 Paroxysmal atrial fibrillation: Secondary | ICD-10-CM

## 2019-09-10 DIAGNOSIS — R918 Other nonspecific abnormal finding of lung field: Secondary | ICD-10-CM

## 2019-09-10 DIAGNOSIS — M50222 Other cervical disc displacement at C5-C6 level: Secondary | ICD-10-CM

## 2019-09-10 DIAGNOSIS — Z952 Presence of prosthetic heart valve: Secondary | ICD-10-CM

## 2019-09-11 ENCOUNTER — Ambulatory Visit (INDEPENDENT_AMBULATORY_CARE_PROVIDER_SITE_OTHER): Payer: Medicare Other | Admitting: Licensed Clinical Social Worker

## 2019-09-11 DIAGNOSIS — M810 Age-related osteoporosis without current pathological fracture: Secondary | ICD-10-CM | POA: Diagnosis not present

## 2019-09-11 DIAGNOSIS — N1831 Chronic kidney disease, stage 3a: Secondary | ICD-10-CM | POA: Diagnosis not present

## 2019-09-11 DIAGNOSIS — I2510 Atherosclerotic heart disease of native coronary artery without angina pectoris: Secondary | ICD-10-CM | POA: Diagnosis not present

## 2019-09-11 DIAGNOSIS — J449 Chronic obstructive pulmonary disease, unspecified: Secondary | ICD-10-CM | POA: Diagnosis not present

## 2019-09-11 DIAGNOSIS — I10 Essential (primary) hypertension: Secondary | ICD-10-CM | POA: Diagnosis not present

## 2019-09-11 DIAGNOSIS — I69351 Hemiplegia and hemiparesis following cerebral infarction affecting right dominant side: Secondary | ICD-10-CM | POA: Diagnosis not present

## 2019-09-11 DIAGNOSIS — N183 Chronic kidney disease, stage 3 unspecified: Secondary | ICD-10-CM | POA: Diagnosis not present

## 2019-09-11 DIAGNOSIS — I130 Hypertensive heart and chronic kidney disease with heart failure and stage 1 through stage 4 chronic kidney disease, or unspecified chronic kidney disease: Secondary | ICD-10-CM | POA: Diagnosis not present

## 2019-09-11 DIAGNOSIS — I509 Heart failure, unspecified: Secondary | ICD-10-CM | POA: Diagnosis not present

## 2019-09-11 DIAGNOSIS — K219 Gastro-esophageal reflux disease without esophagitis: Secondary | ICD-10-CM

## 2019-09-11 NOTE — Chronic Care Management (AMB) (Signed)
  Care Management Note   Crystal Cooper is a 86 y.o. year old female who is a primary care patient of Crystal Gouty, FNP. The CM team was consulted for assistance with chronic disease management and care coordination.   I reached out to [name] by phone today.     Review of patient status, including review of consultants reports, relevant laboratory and other test results, and collaboration with appropriate care team members and the patient's provider was performed as part of comprehensive patient evaluation and provision of chronic care management services.   Social determinants of health: risk of social isolation; risk of physical inactivity    Chronic Care Management from 09/11/2019 in [name]  PHQ-9 Total Score  5     GAD 7 : Generalized Anxiety Score 09/11/2019  Nervous, Anxious, on Edge 1  Control/stop worrying 1  Worry too much - different things 1  Trouble relaxing 0  Restless 0  Easily annoyed or irritable 0  Afraid - awful might happen 0  Total GAD 7 Score 3  Anxiety Difficulty Somewhat difficult   Medications   acetaminophen (TYLENOL) 500 MG tablet albuterol (PROAIR HFA) 108 (90 Base) MCG/ACT inhaler apixaban (ELIQUIS) 2.5 MG TABS tablet Biotin 10 MG CAPS Calcium Carb-Cholecalciferol (CALTRATE 600+D3) 600-800 MG-UNIT TABS docusate sodium (COLACE) 100 MG capsule enalapril (VASOTEC) 2.5 MG tablet ferrous sulfate 325 (65 FE) MG tablet furosemide (LASIX) 40 MG tablet metoprolol tartrate (LOPRESSOR) 25 MG tablet multivitamin-iron-minerals-folic acid (CENTRUM) chewable tablet pantoprazole (PROTONIX) 40 MG tablet   Goals Addressed            This Visit's Progress   . Client will communicate with LCSW in next 30 days to talk about ADLs completion and challenges in doing daily activities of Crystal (pt-stated)         Current Barriers:  . Challenges with ADLs completion in patient with Chronic Diagnoses of Osteoporosis, HTN, CKD,  GERD . Some memory challenges  Clinical Social Work Clinical Goal(s):  [name] LCSW will communicate with client in next 30 days to talk with client about ADLs completion and challenges in doing daily activities for client  Interventions: . Discussed CCM program support . Encouraged client to communicate with RNCM as needed for nursing support . Talked with client about her ADLs completion daily . Talked with client about physical therapy sessions received by client . Talked with client about social support network (she said her daughters are supportive) . Talked with client about ambulation challenges of client   Patient Self Care Activities:  Attends scheduled medical appointments  . SELF CARE DEFICITS: Challenges in ADLs completion Some mobility challenges  Initial goal documentation       Follow Up Plan: LCSW to call client in next 4 weeks to talk with client about daily ADLs completion of client and about challenges in doing daily activities.  [name].Kimbly Eanes MSW, LCSW Licensed Clinical Social Worker [facility] [phone]

## 2019-09-11 NOTE — Patient Instructions (Addendum)
Licensed Clinical Social Worker Visit Information  Goals we discussed today:  Goals Addressed            This Visit's Progress   . Client will communicate with LCSW in next 30 days to talk about ADLs completion and challenges in doing daily activities of clinet (pt-stated)         Current Barriers:  . Challenges with ADLs completion in patient with Chronic Diagnoses of Osteoporosis, HTN, CKD, GERD . Some memory challenges  Clinical Social Work Clinical Goal(s):  [name] LCSW will communicate with client in next 30 days to talk with client about ADLs completion and challenges in doing daily activities for client  Interventions: . Discussed CCM program support . Encouraged client to communicate with RNCM as needed for nursing support . Talked with client about her ADLs completion daily . Talked with client about physical therapy sessions received by client . Talked with client about social support network (she said her daughters are supportive) . Talked with client about ambulation challenges of client   Patient Self Care Activities:  Attends scheduled medical appointments  . SELF CARE DEFICITS: Challenges in ADLs completion Some mobility challenges    Initial goal documentation       Materials Provided: No  Follow Up Plan: LCSW to call client in next 4 weeks to talk with client about daily ADLs completion of client and about challenges in doing daily activities.  The patient verbalized understanding of instructions provided today and declined a print copy of patient instruction materials.   [name].Arlando Leisinger MSW, LCSW Licensed Clinical Social Worker [facility] [phone]

## 2019-09-12 NOTE — Addendum Note (Signed)
Addended by: Jerene Bears on: 09/12/2019 11:39 AM   Modules accepted: Level of Service

## 2019-09-14 DIAGNOSIS — I2510 Atherosclerotic heart disease of native coronary artery without angina pectoris: Secondary | ICD-10-CM | POA: Diagnosis not present

## 2019-09-14 DIAGNOSIS — N183 Chronic kidney disease, stage 3 unspecified: Secondary | ICD-10-CM | POA: Diagnosis not present

## 2019-09-14 DIAGNOSIS — J449 Chronic obstructive pulmonary disease, unspecified: Secondary | ICD-10-CM | POA: Diagnosis not present

## 2019-09-14 DIAGNOSIS — I509 Heart failure, unspecified: Secondary | ICD-10-CM | POA: Diagnosis not present

## 2019-09-14 DIAGNOSIS — I130 Hypertensive heart and chronic kidney disease with heart failure and stage 1 through stage 4 chronic kidney disease, or unspecified chronic kidney disease: Secondary | ICD-10-CM | POA: Diagnosis not present

## 2019-09-14 DIAGNOSIS — I69351 Hemiplegia and hemiparesis following cerebral infarction affecting right dominant side: Secondary | ICD-10-CM | POA: Diagnosis not present

## 2019-09-15 ENCOUNTER — Other Ambulatory Visit: Payer: Self-pay

## 2019-09-15 ENCOUNTER — Other Ambulatory Visit: Payer: Medicare Other

## 2019-09-15 DIAGNOSIS — D509 Iron deficiency anemia, unspecified: Secondary | ICD-10-CM | POA: Diagnosis not present

## 2019-09-15 LAB — CBC WITH DIFFERENTIAL/PLATELET
Basophils Absolute: 0.1 10*3/uL (ref 0.0–0.2)
Basos: 1 %
EOS (ABSOLUTE): 0.2 10*3/uL (ref 0.0–0.4)
Eos: 3 %
Hematocrit: 28.3 % — ABNORMAL LOW (ref 34.0–46.6)
Hemoglobin: 9.4 g/dL — ABNORMAL LOW (ref 11.1–15.9)
Immature Grans (Abs): 0 10*3/uL (ref 0.0–0.1)
Immature Granulocytes: 0 %
Lymphocytes Absolute: 0.9 10*3/uL (ref 0.7–3.1)
Lymphs: 16 %
MCH: 32 pg (ref 26.6–33.0)
MCHC: 33.2 g/dL (ref 31.5–35.7)
MCV: 96 fL (ref 79–97)
Monocytes Absolute: 0.7 10*3/uL (ref 0.1–0.9)
Monocytes: 12 %
Neutrophils Absolute: 4.2 10*3/uL (ref 1.4–7.0)
Neutrophils: 68 %
Platelets: 301 10*3/uL (ref 150–450)
RBC: 2.94 x10E6/uL — ABNORMAL LOW (ref 3.77–5.28)
RDW: 13.2 % (ref 11.7–15.4)
WBC: 6 10*3/uL (ref 3.4–10.8)

## 2019-09-16 ENCOUNTER — Other Ambulatory Visit: Payer: Self-pay

## 2019-09-16 DIAGNOSIS — I69351 Hemiplegia and hemiparesis following cerebral infarction affecting right dominant side: Secondary | ICD-10-CM | POA: Diagnosis not present

## 2019-09-16 DIAGNOSIS — I2510 Atherosclerotic heart disease of native coronary artery without angina pectoris: Secondary | ICD-10-CM | POA: Diagnosis not present

## 2019-09-16 DIAGNOSIS — I130 Hypertensive heart and chronic kidney disease with heart failure and stage 1 through stage 4 chronic kidney disease, or unspecified chronic kidney disease: Secondary | ICD-10-CM | POA: Diagnosis not present

## 2019-09-16 DIAGNOSIS — J449 Chronic obstructive pulmonary disease, unspecified: Secondary | ICD-10-CM | POA: Diagnosis not present

## 2019-09-16 DIAGNOSIS — I509 Heart failure, unspecified: Secondary | ICD-10-CM | POA: Diagnosis not present

## 2019-09-16 DIAGNOSIS — D509 Iron deficiency anemia, unspecified: Secondary | ICD-10-CM

## 2019-09-16 DIAGNOSIS — N183 Chronic kidney disease, stage 3 unspecified: Secondary | ICD-10-CM | POA: Diagnosis not present

## 2019-09-18 DIAGNOSIS — J449 Chronic obstructive pulmonary disease, unspecified: Secondary | ICD-10-CM | POA: Diagnosis not present

## 2019-09-18 DIAGNOSIS — N183 Chronic kidney disease, stage 3 unspecified: Secondary | ICD-10-CM | POA: Diagnosis not present

## 2019-09-18 DIAGNOSIS — I130 Hypertensive heart and chronic kidney disease with heart failure and stage 1 through stage 4 chronic kidney disease, or unspecified chronic kidney disease: Secondary | ICD-10-CM | POA: Diagnosis not present

## 2019-09-18 DIAGNOSIS — I2510 Atherosclerotic heart disease of native coronary artery without angina pectoris: Secondary | ICD-10-CM | POA: Diagnosis not present

## 2019-09-18 DIAGNOSIS — I509 Heart failure, unspecified: Secondary | ICD-10-CM | POA: Diagnosis not present

## 2019-09-18 DIAGNOSIS — I69351 Hemiplegia and hemiparesis following cerebral infarction affecting right dominant side: Secondary | ICD-10-CM | POA: Diagnosis not present

## 2019-09-23 ENCOUNTER — Encounter: Payer: Self-pay | Admitting: Family Medicine

## 2019-09-23 ENCOUNTER — Ambulatory Visit (INDEPENDENT_AMBULATORY_CARE_PROVIDER_SITE_OTHER): Payer: Medicare Other | Admitting: Family Medicine

## 2019-09-23 ENCOUNTER — Telehealth: Payer: Self-pay | Admitting: Family Medicine

## 2019-09-23 ENCOUNTER — Other Ambulatory Visit: Payer: Self-pay

## 2019-09-23 VITALS — BP 130/61 | HR 89 | Temp 97.3°F | Ht 62.0 in | Wt 114.0 lb

## 2019-09-23 DIAGNOSIS — R011 Cardiac murmur, unspecified: Secondary | ICD-10-CM | POA: Diagnosis not present

## 2019-09-23 DIAGNOSIS — L309 Dermatitis, unspecified: Secondary | ICD-10-CM

## 2019-09-23 DIAGNOSIS — N1831 Chronic kidney disease, stage 3a: Secondary | ICD-10-CM

## 2019-09-23 DIAGNOSIS — R062 Wheezing: Secondary | ICD-10-CM | POA: Insufficient documentation

## 2019-09-23 DIAGNOSIS — D500 Iron deficiency anemia secondary to blood loss (chronic): Secondary | ICD-10-CM

## 2019-09-23 DIAGNOSIS — I10 Essential (primary) hypertension: Secondary | ICD-10-CM | POA: Diagnosis not present

## 2019-09-23 DIAGNOSIS — E871 Hypo-osmolality and hyponatremia: Secondary | ICD-10-CM

## 2019-09-23 DIAGNOSIS — J4530 Mild persistent asthma, uncomplicated: Secondary | ICD-10-CM

## 2019-09-23 MED ORDER — ADVAIR HFA 115-21 MCG/ACT IN AERO: 2.0000 | INHALATION_SPRAY | Freq: Two times a day (BID) | RESPIRATORY_TRACT | 12 refills | Status: DC

## 2019-09-23 MED ORDER — BREO ELLIPTA 100-25 MCG/INH IN AEPB: 1.0000 | INHALATION_SPRAY | Freq: Every day | RESPIRATORY_TRACT | 11 refills | Status: DC

## 2019-09-23 NOTE — Telephone Encounter (Signed)
Patient aware and verbalized understanding. °

## 2019-09-23 NOTE — Patient Instructions (Signed)
Mix equal parts of the following and apply at each diaper change for the next five days. If rash has not resolved in 5 days, add 1% hydrocortisone cream to the remaining mixture and apply for the next 3-4 days. Store in the refrigerator.   A & D Ointment  Maalox or Milk of Magnesia  Lotrimin Lotion

## 2019-09-23 NOTE — Progress Notes (Signed)
Subjective:  Patient ID: Crystal Cooper, female    DOB: 1926-11-10, 86 y.o.   MRN: [id number]  Patient Care Team: [name], MD as PCP - General (Family Medicine) Ander Slade, [place] Beers, MD as Referring Physician (Ophthalmology) Stanford Breed Denice Bors, MD as Consulting Physician (Cardiology) Constance Haw, MD as Consulting Physician (Cardiology) Ilean China, RN as Registered Nurse ([place]) Shea Evans [name], LCSW as Social Worker (Licensed Clinical Social Worker)   Chief Complaint:  Medical Management of Chronic Issues and Hypertension   HPI: Crystal Cooper is a 86 y.o. female presenting on 09/23/2019 for Medical Management of Chronic Issues and Hypertension   1. Dermatitis Pt reports an ongoing rash to her groin and buttocks. Pt states this started when she was in the hospital. States she has been using Aquaphor without complete relief of symptoms.   2. Essential hypertension Compliant with medications without associated side effects. No headache, chest pain, palpitations, dizziness, or confusion. Slight lower extremity swelling. No worse than normal.   3. Iron deficiency anemia due to chronic blood loss Has not noticed any BRB per rectum in several days. Has been taking iron repletion without side effects. States she is feeling better and his regaining her strength.   4. Stage 3a chronic kidney disease No changes in urine output. No increased edema. No confusion.   5. Hyponatremia Denies dizziness, weakness, or syncope.    6. Wheezing Pt reports worsening of her wheezing. States she has been using her Albuterol at least 2 times daily without complete relief of symptoms.      Relevant past medical, surgical, family, and social history reviewed and updated as indicated.  Allergies and medications reviewed and updated. Date reviewed: Chart in Epic.   Past Medical History:  Diagnosis Date  . Anemia    years ago  . Aortic stenosis   . Arthritis   .  Asthma   . Atrial fibrillation ([place])   . CHF (congestive heart failure) ([place]) 11/2014  . Family history of adverse reaction to anesthesia    2 daughters would have N/V  . Glaucoma   . Hearing loss   . Heart murmur   . HTN (hypertension)   . Pneumonia   . Prolapsing mitral leaflet syndrome   . Shortness of breath dyspnea    with exertion  . SVT (supraventricular tachycardia) ([facility])    S/P ablation of AVNRT in 2003    Past Surgical History:  Procedure Laterality Date  . APPENDECTOMY    . BIOPSY  04/07/2018   Procedure: BIOPSY;  Surgeon: Jerene Bears, MD;  Location: [facility] ENDOSCOPY;  Service: Gastroenterology;;  . BLADDER SURGERY    . CARDIAC CATHETERIZATION N/A 09/26/2015   Procedure: Right/Left Heart Cath and Coronary Angiography;  Surgeon: Burnell Blanks, MD;  Location: [place] CV LAB;  Service: Cardiovascular;  Laterality: N/A;  . CARDIAC SURGERY    . CARDIOVERSION N/A 03/02/2016   Procedure: CARDIOVERSION;  Surgeon: Thayer Headings, MD;  Location: [place];  Service: Cardiovascular;  Laterality: N/A;  . EP IMPLANTABLE DEVICE N/A 10/26/2015   Procedure: Pacemaker Implant;  Surgeon: Will Meredith Leeds, MD;  Location: [place] CV LAB;  Service: Cardiovascular;  Laterality: N/A;  . ESOPHAGOGASTRODUODENOSCOPY (EGD) WITH PROPOFOL N/A 04/07/2018   Procedure: ESOPHAGOGASTRODUODENOSCOPY (EGD) WITH PROPOFOL;  Surgeon: Jerene Bears, MD;  Location: [facility] ENDOSCOPY;  Service: Gastroenterology;  Laterality: N/A;  . EYE SURGERY Bilateral    cataract surgery  . HOT HEMOSTASIS N/A  04/07/2018   Procedure: HOT HEMOSTASIS (ARGON PLASMA COAGULATION/BICAP);  Surgeon: Jerene Bears, MD;  Location: [facility] ENDOSCOPY;  Service: Gastroenterology;  Laterality: N/A;  . NASAL SINUS SURGERY    . TEE WITHOUT CARDIOVERSION N/A 10/25/2015   Procedure: TRANSESOPHAGEAL ECHOCARDIOGRAM (TEE);  Surgeon: Burnell Blanks, MD;  Location: [place];  Service: Open Heart Surgery;  Laterality: N/A;  . TRANSCATHETER  AORTIC VALVE REPLACEMENT, TRANSFEMORAL Right 10/25/2015   Procedure: TRANSCATHETER AORTIC VALVE REPLACEMENT, TRANSFEMORAL;  Surgeon: Burnell Blanks, MD;  Location: [place];  Service: Open Heart Surgery;  Laterality: Right;    Social History   Socioeconomic History  . Marital status: Widowed    Spouse name: Not on file  . Number of children: 3  . Years of education: Not on file  . Highest education level: 12th grade  Occupational History  . Occupation: Retired  Tobacco Use  . Smoking status: Never Smoker  . Smokeless tobacco: Never Used  Substance and Sexual Activity  . Alcohol use: No    Alcohol/week: 0.0 standard drinks  . Drug use: No  . Sexual activity: Never  Other Topics Concern  . Not on file  Social History Narrative  . Not on file   Social Determinants of Health   Financial Resource Strain:   . Difficulty of Paying Living Expenses:   Food Insecurity: No Food Insecurity  . Worried About Charity fundraiser in the Last Year: Never true  . Ran Out of Food in the Last Year: Never true  Transportation Needs: No Transportation Needs  . Lack of Transportation (Medical): No  . Lack of Transportation (Non-Medical): No  Physical Activity: Inactive  . Days of Exercise per Week: 0 days  . Minutes of Exercise per Session: 0 min  Stress: No Stress Concern Present  . Feeling of Stress : Not at all  Social Connections: Slightly Isolated  . Frequency of Communication with Friends and Family: More than three times a week  . Frequency of Social Gatherings with Friends and Family: More than three times a week  . Attends Religious Services: More than 4 times per year  . Active Member of Clubs or Organizations: Yes  . Attends Archivist Meetings: More than 4 times per year  . Marital Status: Widowed  Intimate Partner Violence: Not At Risk  . Fear of Current or Ex-Partner: No  . Emotionally Abused: No  . Physically Abused: No  . Sexually Abused: No    Outpatient  Encounter Medications as of 09/23/2019  Medication Sig  . acetaminophen (TYLENOL) 500 MG tablet Take 500 mg by mouth every 6 (six) hours as needed for mild pain or headache.   . albuterol (PROAIR HFA) 108 (90 Base) MCG/ACT inhaler Inhale 2 puffs into the lungs every 4 (four) hours as needed for wheezing or shortness of breath.  [name] apixaban (ELIQUIS) 2.5 MG TABS tablet Take 1 tablet (2.5 mg total) by mouth 2 (two) times daily.  . Biotin 10 MG CAPS Take 10 mg by mouth daily.   . Calcium Carb-Cholecalciferol (CALTRATE 600+D3) 600-800 MG-UNIT TABS Take 1 tablet by mouth daily.  [name] docusate sodium (COLACE) 100 MG capsule Take 100 mg by mouth daily as needed for mild constipation.  . enalapril (VASOTEC) 2.5 MG tablet Take 2.5 mg by mouth daily.  . ferrous sulfate 325 (65 FE) MG tablet Take 1 tablet (325 mg total) by mouth 2 (two) times daily with a meal.  . fluticasone-salmeterol (ADVAIR HFA) 115-21 MCG/ACT inhaler Inhale  2 puffs into the lungs 2 (two) times daily.  . furosemide (LASIX) 40 MG tablet Take 1.5 tablets (60 mg total) by mouth daily.  . metoprolol tartrate (LOPRESSOR) 25 MG tablet Take 1 tablet (25 mg total) by mouth 2 (two) times daily.  . multivitamin-iron-minerals-folic acid (CENTRUM) chewable tablet Chew 1 tablet by mouth daily.  . pantoprazole (PROTONIX) 40 MG tablet Take 1 tablet (40 mg total) by mouth 2 (two) times daily before a meal.   No facility-administered encounter medications on file as of 09/23/2019.    Allergies  Allergen Reactions  . Hctz [Hydrochlorothiazide] Other (See Comments)    Pt was ill and this affected her kidneys   . Aspirin Other (See Comments)    Cardiologist said the patient is to not take this  . Codeine Other (See Comments)    Made the patient feel ill, has not had any problems since 1977    Review of Systems  Constitutional: Negative for activity change, appetite change, chills, diaphoresis, fatigue, fever and unexpected weight change.  HENT:  Negative.   Eyes: Negative.  Negative for photophobia and visual disturbance.  Respiratory: Positive for wheezing. Negative for cough, chest tightness and shortness of breath.   Cardiovascular: Positive for leg swelling. Negative for chest pain and palpitations.  Gastrointestinal: Negative for abdominal pain, anal bleeding, blood in stool, constipation, diarrhea, nausea, rectal pain and vomiting.  Endocrine: Negative.  Negative for cold intolerance, heat intolerance, polydipsia, polyphagia and polyuria.  Genitourinary: Negative for decreased urine volume, difficulty urinating, dysuria, frequency and urgency.  Musculoskeletal: Positive for gait problem (improved, using cane today). Negative for arthralgias and myalgias.  Skin: Positive for color change and rash.  Allergic/Immunologic: Negative.   Neurological: Negative for dizziness, tremors, seizures, syncope, facial asymmetry, speech difficulty, weakness (greatly improved, still slight weakness at end of day), light-headedness and headaches.  Hematological: Negative.   Psychiatric/Behavioral: Negative for confusion, hallucinations, sleep disturbance and suicidal ideas.  All other systems reviewed and are negative.       Objective:  BP 130/61   Pulse 89   Temp (!) 97.3 F (36.3 C)   Ht _0  (1.575 m)   Wt 114 lb (51.7 kg)   SpO2 98%   BMI 20.85 kg/m    Wt Readings from Last 3 Encounters:  09/23/19 114 lb (51.7 kg)  09/09/19 111 lb (50.3 kg)  09/08/19 109 lb (49.4 kg)    Physical Exam Vitals and nursing note reviewed.  Constitutional:      General: She is not in acute distress.    Appearance: Normal appearance. She is well-developed and well-groomed. She is not ill-appearing, toxic-appearing or diaphoretic.  HENT:     Head: Normocephalic and atraumatic.     Jaw: There is normal jaw occlusion.     Right Ear: Hearing normal.     Left Ear: Hearing normal.     Nose: Nose normal.     Mouth/Throat:     Lips: Pink.     Mouth:  Mucous membranes are moist.     Pharynx: Oropharynx is clear. Uvula midline.  Eyes:     General: Lids are normal.     Extraocular Movements: Extraocular movements intact.     Conjunctiva/sclera: Conjunctivae normal.     Pupils: Pupils are equal, round, and reactive to light.  Neck:     Thyroid: No thyroid mass, thyromegaly or thyroid tenderness.     Vascular: No carotid bruit or JVD.     Trachea: Trachea and phonation normal.  Cardiovascular:     Rate and Rhythm: Normal rate. Rhythm irregular.     Chest Wall: PMI is not displaced.     Pulses: Normal pulses.     Heart sounds: Murmur present. Systolic murmur present with a grade of 1/6. No friction rub. No gallop.   Pulmonary:     Effort: Pulmonary effort is normal. No respiratory distress.     Breath sounds: Wheezing present.  Abdominal:     General: Bowel sounds are normal. There is no distension or abdominal bruit.     Palpations: Abdomen is soft. There is no hepatomegaly or splenomegaly.     Tenderness: There is no abdominal tenderness. There is no right CVA tenderness or left CVA tenderness.     Hernia: No hernia is present.  Musculoskeletal:        General: Normal range of motion.     Cervical back: Normal range of motion and neck supple.     Right lower leg: 1+ Edema present.     Left lower leg: 1+ Edema present.  Lymphadenopathy:     Cervical: No cervical adenopathy.  Skin:    General: Skin is warm and dry.     Capillary Refill: Capillary refill takes less than 2 seconds.     Coloration: Skin is not cyanotic, jaundiced or pale.     Findings: No rash.  Neurological:     General: No focal deficit present.     Mental Status: She is alert and oriented to person, place, and time.     Cranial Nerves: Cranial nerves are intact.     Sensory: Sensation is intact.     Motor: Motor function is intact.     Coordination: Coordination is intact.     Gait: Gait abnormal (uses cane).     Deep Tendon Reflexes: Reflexes are normal and  symmetric.  Psychiatric:        Attention and Perception: Attention and perception normal.        Mood and Affect: Mood and affect normal.        Speech: Speech normal.        Behavior: Behavior normal. Behavior is cooperative.        Thought Content: Thought content normal.        Cognition and Memory: Cognition and memory normal.        Judgment: Judgment normal.     Results for orders placed or performed in visit on 09/15/19  CBC with Differential/Platelet  Result Value Ref Range   WBC 6.0 3.4 - 10.8 x10E3/uL   RBC 2.94 (L) 3.77 - 5.28 x10E6/uL   Hemoglobin 9.4 (L) 11.1 - 15.9 g/dL   Hematocrit 28.3 (L) 34.0 - 46.6 %   MCV 96 79 - 97 fL   MCH 32.0 26.6 - 33.0 pg   MCHC 33.2 31.5 - 35.7 g/dL   RDW 13.2 11.7 - 15.4 %   Platelets 301 150 - 450 x10E3/uL   Neutrophils 68 Not Estab. %   Lymphs 16 Not Estab. %   Monocytes 12 Not Estab. %   Eos 3 Not Estab. %   Basos 1 Not Estab. %   Neutrophils Absolute 4.2 1.4 - 7.0 x10E3/uL   Lymphocytes Absolute 0.9 0.7 - 3.1 x10E3/uL   Monocytes Absolute 0.7 0.1 - 0.9 x10E3/uL   EOS (ABSOLUTE) 0.2 0.0 - 0.4 x10E3/uL   Basophils Absolute 0.1 0.0 - 0.2 x10E3/uL   Immature Granulocytes 0 Not Estab. %   Immature Grans (Abs) 0.0 0.0 -  0.1 x10E3/uL       Pertinent labs & imaging results that were available during my care of the patient were reviewed by me and considered in my medical decision making.  Assessment & Plan:  Shiah was seen today for medical management of chronic issues and hypertension.  Diagnoses and all orders for this visit:  Dermatitis Symptomatic care discussed in detail. Pt aware to report any new, worsening, or persistent symptoms.   Essential hypertension BP well controlled. Changes were not made in regimen today. Goal BP is 130/80. Pt aware to report any persistent high or low readings. DASH diet and exercise encouraged. Exercise at least 150 minutes per week and increase as tolerated. Goal BMI > 25. Stress  management encouraged. Avoid nicotine and tobacco product use. Avoid excessive alcohol and NSAID's. Avoid more than 2000 mg of sodium daily. Medications as prescribed. Follow up as scheduled.  -     CMP14+EGFR; Future -     CBC with Differential/Platelet; Future -     Lipid panel; Future -     Thyroid Panel With TSH; Future  Iron deficiency anemia due to chronic blood loss No continued BRB per rectum. Labs pending.  -     CBC with Differential/Platelet; Future  Stage 3a chronic kidney disease No changes in urine output. Minimal swelling to lower extremities. Labs pending.  -     CMP14+EGFR; Future  Hyponatremia Will recheck labs today. Denies confusion or dizziness. No nausea or vomiting.  -     CMP14+EGFR; Future  Murmur No worsening symptoms. Continue to follow up with cardiology as scheduled. Labs pending.  -     CMP14+EGFR; Future -     CBC with Differential/Platelet; Future  Wheezing Mild persistent asthma without complication Doing well with albuterol as needed but still having wheezing. Will add Advair to regimen. Pt aware to rinse mouth well after dosing and to report any new, worsening, or persistent symptoms.  -     fluticasone-salmeterol (ADVAIR HFA) 115-21 MCG/ACT inhaler; Inhale 2 puffs into the lungs 2 (two) times daily.     Continue all other maintenance medications.  Follow up plan: Return in about 3 months (around 12/23/2019), or if symptoms worsen or fail to improve, for chronic follow up.   Continue healthy lifestyle choices, including diet (rich in fruits, vegetables, and lean proteins, and low in salt and simple carbohydrates) and exercise (at least 30 minutes of moderate physical activity daily).  Educational handout given for dermatitis  The above assessment and management plan was discussed with the patient. The patient verbalized understanding of and has agreed to the management plan. Patient is aware to call the clinic if they develop any new symptoms  or if symptoms persist or worsen. Patient is aware when to return to the clinic for a follow-up visit. Patient educated on when it is appropriate to go to the emergency department.   Monia Pouch, FNP-C [facility] [phone]

## 2019-09-23 NOTE — Telephone Encounter (Signed)
Will trial symbicort

## 2019-09-23 NOTE — Addendum Note (Signed)
Addended by: Baruch Gouty on: 09/23/2019 04:44 PM   Modules accepted: Orders

## 2019-09-24 DIAGNOSIS — I130 Hypertensive heart and chronic kidney disease with heart failure and stage 1 through stage 4 chronic kidney disease, or unspecified chronic kidney disease: Secondary | ICD-10-CM | POA: Diagnosis not present

## 2019-09-24 DIAGNOSIS — I509 Heart failure, unspecified: Secondary | ICD-10-CM | POA: Diagnosis not present

## 2019-09-24 DIAGNOSIS — N183 Chronic kidney disease, stage 3 unspecified: Secondary | ICD-10-CM | POA: Diagnosis not present

## 2019-09-24 DIAGNOSIS — I69351 Hemiplegia and hemiparesis following cerebral infarction affecting right dominant side: Secondary | ICD-10-CM | POA: Diagnosis not present

## 2019-09-24 DIAGNOSIS — I2510 Atherosclerotic heart disease of native coronary artery without angina pectoris: Secondary | ICD-10-CM | POA: Diagnosis not present

## 2019-09-24 DIAGNOSIS — J449 Chronic obstructive pulmonary disease, unspecified: Secondary | ICD-10-CM | POA: Diagnosis not present

## 2019-09-28 ENCOUNTER — Encounter: Payer: Self-pay | Admitting: Family Medicine

## 2019-09-28 ENCOUNTER — Telehealth: Payer: Self-pay | Admitting: Family Medicine

## 2019-09-28 ENCOUNTER — Ambulatory Visit (INDEPENDENT_AMBULATORY_CARE_PROVIDER_SITE_OTHER): Payer: Medicare Other | Admitting: Family Medicine

## 2019-09-28 DIAGNOSIS — R609 Edema, unspecified: Secondary | ICD-10-CM

## 2019-09-28 NOTE — Progress Notes (Signed)
Virtual Visit via telephone Note Due to COVID-19 pandemic this visit was conducted virtually. This visit type was conducted due to national recommendations for restrictions regarding the COVID-19 Pandemic (e.g. social distancing, sheltering in place) in an effort to limit this patient's exposure and mitigate transmission in our community. All issues noted in this document were discussed and addressed.  A physical exam was not performed with this format.   I connected with Crystal Cooper on 09/28/2019 at 0830 by telephone and verified that I am speaking with the correct person using two identifiers. Crystal Cooper is currently located at home and family is currently with them during visit. The provider, Monia Pouch, FNP is located in their office at time of visit.  I discussed the limitations, risks, security and privacy concerns of performing an evaluation and management service by telephone and the availability of in person appointments. I also discussed with the patient that there may be a patient responsible charge related to this service. The patient expressed understanding and agreed to proceed.  Subjective:  Patient ID: Crystal Cooper, female    DOB: 1927/01/01, 86 y.o.   MRN: [id number]  Chief Complaint:  Foot Swelling   HPI: Crystal Cooper is a 86 y.o. female presenting on 09/28/2019 for Foot Swelling   Pt and daughter report bilateral feet swelling. States this was noticed yesterday and has improved today. States pt was up more than normal yesterday due to Easter. States her weight has not increased over 2 lbs. No chest pain, shortness of breath, palpitations, dizziness, or syncope. No redness, weeping, or wounds to lower extremities. No calf pain. No orthopnea, PND, or exertional dyspnea.     Relevant past medical, surgical, family, and social history reviewed and updated as indicated.  Allergies and medications reviewed and updated.   Past Medical History:  Diagnosis Date  .  Anemia    years ago  . Aortic stenosis   . Arthritis   . Asthma   . Atrial fibrillation ([place])   . CHF (congestive heart failure) ([place]) 11/2014  . Family history of adverse reaction to anesthesia    2 daughters would have N/V  . Glaucoma   . Hearing loss   . Heart murmur   . HTN (hypertension)   . Pneumonia   . Prolapsing mitral leaflet syndrome   . Shortness of breath dyspnea    with exertion  . SVT (supraventricular tachycardia) ([facility])    S/P ablation of AVNRT in 2003    Past Surgical History:  Procedure Laterality Date  . APPENDECTOMY    . BIOPSY  04/07/2018   Procedure: BIOPSY;  Surgeon: Jerene Bears, MD;  Location: [facility] ENDOSCOPY;  Service: Gastroenterology;;  . BLADDER SURGERY    . CARDIAC CATHETERIZATION N/A 09/26/2015   Procedure: Right/Left Heart Cath and Coronary Angiography;  Surgeon: Burnell Blanks, MD;  Location: [place] CV LAB;  Service: Cardiovascular;  Laterality: N/A;  . CARDIAC SURGERY    . CARDIOVERSION N/A 03/02/2016   Procedure: CARDIOVERSION;  Surgeon: Thayer Headings, MD;  Location: [place];  Service: Cardiovascular;  Laterality: N/A;  . EP IMPLANTABLE DEVICE N/A 10/26/2015   Procedure: Pacemaker Implant;  Surgeon: Will Meredith Leeds, MD;  Location: [place] CV LAB;  Service: Cardiovascular;  Laterality: N/A;  . ESOPHAGOGASTRODUODENOSCOPY (EGD) WITH PROPOFOL N/A 04/07/2018   Procedure: ESOPHAGOGASTRODUODENOSCOPY (EGD) WITH PROPOFOL;  Surgeon: Jerene Bears, MD;  Location: [facility] ENDOSCOPY;  Service: Gastroenterology;  Laterality: N/A;  . EYE SURGERY  Bilateral    cataract surgery  . HOT HEMOSTASIS N/A 04/07/2018   Procedure: HOT HEMOSTASIS (ARGON PLASMA COAGULATION/BICAP);  Surgeon: Jerene Bears, MD;  Location: [facility] ENDOSCOPY;  Service: Gastroenterology;  Laterality: N/A;  . NASAL SINUS SURGERY    . TEE WITHOUT CARDIOVERSION N/A 10/25/2015   Procedure: TRANSESOPHAGEAL ECHOCARDIOGRAM (TEE);  Surgeon: Burnell Blanks, MD;  Location: [place];   Service: Open Heart Surgery;  Laterality: N/A;  . TRANSCATHETER AORTIC VALVE REPLACEMENT, TRANSFEMORAL Right 10/25/2015   Procedure: TRANSCATHETER AORTIC VALVE REPLACEMENT, TRANSFEMORAL;  Surgeon: Burnell Blanks, MD;  Location: [place];  Service: Open Heart Surgery;  Laterality: Right;    Social History   Socioeconomic History  . Marital status: Widowed    Spouse name: Not on file  . Number of children: 3  . Years of education: Not on file  . Highest education level: 12th grade  Occupational History  . Occupation: Retired  Tobacco Use  . Smoking status: Never Smoker  . Smokeless tobacco: Never Used  Substance and Sexual Activity  . Alcohol use: No    Alcohol/week: 0.0 standard drinks  . Drug use: No  . Sexual activity: Never  Other Topics Concern  . Not on file  Social History Narrative  . Not on file   Social Determinants of Health   Financial Resource Strain:   . Difficulty of Paying Living Expenses:   Food Insecurity: No Food Insecurity  . Worried About Charity fundraiser in the Last Year: Never true  . Ran Out of Food in the Last Year: Never true  Transportation Needs: No Transportation Needs  . Lack of Transportation (Medical): No  . Lack of Transportation (Non-Medical): No  Physical Activity: Inactive  . Days of Exercise per Week: 0 days  . Minutes of Exercise per Session: 0 min  Stress: No Stress Concern Present  . Feeling of Stress : Not at all  Social Connections: Slightly Isolated  . Frequency of Communication with Friends and Family: More than three times a week  . Frequency of Social Gatherings with Friends and Family: More than three times a week  . Attends Religious Services: More than 4 times per year  . Active Member of Clubs or Organizations: Yes  . Attends Archivist Meetings: More than 4 times per year  . Marital Status: Widowed  Intimate Partner Violence: Not At Risk  . Fear of Current or Ex-Partner: No  . Emotionally Abused: No    . Physically Abused: No  . Sexually Abused: No    Outpatient Encounter Medications as of 09/28/2019  Medication Sig  . acetaminophen (TYLENOL) 500 MG tablet Take 500 mg by mouth every 6 (six) hours as needed for mild pain or headache.   . albuterol (PROAIR HFA) 108 (90 Base) MCG/ACT inhaler Inhale 2 puffs into the lungs every 4 (four) hours as needed for wheezing or shortness of breath.  [name] apixaban (ELIQUIS) 2.5 MG TABS tablet Take 1 tablet (2.5 mg total) by mouth 2 (two) times daily.  . Biotin 10 MG CAPS Take 10 mg by mouth daily.   . Calcium Carb-Cholecalciferol (CALTRATE 600+D3) 600-800 MG-UNIT TABS Take 1 tablet by mouth daily.  [name] docusate sodium (COLACE) 100 MG capsule Take 100 mg by mouth daily as needed for mild constipation.  . enalapril (VASOTEC) 2.5 MG tablet Take 2.5 mg by mouth daily.  . ferrous sulfate 325 (65 FE) MG tablet Take 1 tablet (325 mg total) by mouth 2 (two) times daily  with a meal.  . fluticasone furoate-vilanterol (BREO ELLIPTA) 100-25 MCG/INH AEPB Inhale 1 puff into the lungs daily.  . furosemide (LASIX) 40 MG tablet Take 1.5 tablets (60 mg total) by mouth daily.  . metoprolol tartrate (LOPRESSOR) 25 MG tablet Take 1 tablet (25 mg total) by mouth 2 (two) times daily.  . multivitamin-iron-minerals-folic acid (CENTRUM) chewable tablet Chew 1 tablet by mouth daily.  . pantoprazole (PROTONIX) 40 MG tablet Take 1 tablet (40 mg total) by mouth 2 (two) times daily before a meal.   No facility-administered encounter medications on file as of 09/28/2019.    Allergies  Allergen Reactions  . Hctz [Hydrochlorothiazide] Other (See Comments)    Pt was ill and this affected her kidneys   . Aspirin Other (See Comments)    Cardiologist said the patient is to not take this  . Codeine Other (See Comments)    Made the patient feel ill, has not had any problems since 1977    Review of Systems  Constitutional: Negative for activity change, appetite change, chills, diaphoresis,  fatigue, fever and unexpected weight change.  HENT: Negative.   Eyes: Negative.   Respiratory: Negative for apnea, cough, choking, chest tightness, shortness of breath, wheezing and stridor.   Cardiovascular: Positive for leg swelling. Negative for chest pain and palpitations.  Gastrointestinal: Negative for abdominal distention, abdominal pain, blood in stool, constipation, diarrhea, nausea and vomiting.  Endocrine: Negative.   Genitourinary: Negative for decreased urine volume, difficulty urinating, dysuria, frequency and urgency.  Musculoskeletal: Negative for arthralgias and myalgias.  Skin: Negative.   Allergic/Immunologic: Negative.   Neurological: Negative for dizziness, tremors, seizures, syncope, facial asymmetry, speech difficulty, light-headedness and numbness.  Hematological: Negative.   Psychiatric/Behavioral: Negative for confusion, hallucinations, sleep disturbance and suicidal ideas.  All other systems reviewed and are negative.        Observations/Objective: No vital signs or physical exam, this was a telephone or virtual health encounter.  Pt alert and oriented, answers all questions appropriately, and able to speak in full sentences.    Assessment and Plan: Neviah was seen today for foot swelling.  Diagnoses and all orders for this visit:  Dependent edema Pt reported swelling to bilateral feet yesterday. Has improved over night. Pt was up more than usual yesterday due to Easter. Symptomatic care discussed with daughter in detail. Limit sodium intake, elevate legs when sitting, and wear compression hose. Daughter and pt aware of symptoms that need immediate evaluation.     Follow Up Instructions: Return if symptoms worsen or fail to improve.    I discussed the assessment and treatment plan with the patient. The patient was provided an opportunity to ask questions and all were answered. The patient agreed with the plan and demonstrated an understanding of the  instructions.   The patient was advised to call back or seek an in-person evaluation if the symptoms worsen or if the condition fails to improve as anticipated.  The above assessment and management plan was discussed with the patient. The patient verbalized understanding of and has agreed to the management plan. Patient is aware to call the clinic if they develop any new symptoms or if symptoms persist or worsen. Patient is aware when to return to the clinic for a follow-up visit. Patient educated on when it is appropriate to go to the emergency department.    I provided 10 minutes of non-face-to-face time during this encounter. The call started at 0830. The call ended at [facility]. The other time was  used for coordination of care.    Monia Pouch, FNP-C [facility] [address] [place],  [place] [address] [phone] 09/28/2019

## 2019-09-29 ENCOUNTER — Other Ambulatory Visit: Payer: Self-pay

## 2019-09-29 ENCOUNTER — Other Ambulatory Visit: Payer: Medicare Other

## 2019-09-29 DIAGNOSIS — R011 Cardiac murmur, unspecified: Secondary | ICD-10-CM

## 2019-09-29 DIAGNOSIS — N1831 Chronic kidney disease, stage 3a: Secondary | ICD-10-CM | POA: Diagnosis not present

## 2019-09-29 DIAGNOSIS — I2510 Atherosclerotic heart disease of native coronary artery without angina pectoris: Secondary | ICD-10-CM | POA: Diagnosis not present

## 2019-09-29 DIAGNOSIS — E871 Hypo-osmolality and hyponatremia: Secondary | ICD-10-CM

## 2019-09-29 DIAGNOSIS — D509 Iron deficiency anemia, unspecified: Secondary | ICD-10-CM

## 2019-09-29 DIAGNOSIS — R062 Wheezing: Secondary | ICD-10-CM

## 2019-09-29 DIAGNOSIS — I10 Essential (primary) hypertension: Secondary | ICD-10-CM

## 2019-09-29 DIAGNOSIS — D500 Iron deficiency anemia secondary to blood loss (chronic): Secondary | ICD-10-CM | POA: Diagnosis not present

## 2019-09-29 DIAGNOSIS — I69351 Hemiplegia and hemiparesis following cerebral infarction affecting right dominant side: Secondary | ICD-10-CM | POA: Diagnosis not present

## 2019-09-29 DIAGNOSIS — I509 Heart failure, unspecified: Secondary | ICD-10-CM | POA: Diagnosis not present

## 2019-09-29 DIAGNOSIS — N183 Chronic kidney disease, stage 3 unspecified: Secondary | ICD-10-CM | POA: Diagnosis not present

## 2019-09-29 DIAGNOSIS — J449 Chronic obstructive pulmonary disease, unspecified: Secondary | ICD-10-CM | POA: Diagnosis not present

## 2019-09-29 DIAGNOSIS — I130 Hypertensive heart and chronic kidney disease with heart failure and stage 1 through stage 4 chronic kidney disease, or unspecified chronic kidney disease: Secondary | ICD-10-CM | POA: Diagnosis not present

## 2019-09-30 ENCOUNTER — Ambulatory Visit (INDEPENDENT_AMBULATORY_CARE_PROVIDER_SITE_OTHER): Payer: Medicare Other | Admitting: *Deleted

## 2019-09-30 DIAGNOSIS — N183 Chronic kidney disease, stage 3 unspecified: Secondary | ICD-10-CM | POA: Diagnosis not present

## 2019-09-30 DIAGNOSIS — I10 Essential (primary) hypertension: Secondary | ICD-10-CM

## 2019-09-30 DIAGNOSIS — I69351 Hemiplegia and hemiparesis following cerebral infarction affecting right dominant side: Secondary | ICD-10-CM | POA: Diagnosis not present

## 2019-09-30 DIAGNOSIS — Z950 Presence of cardiac pacemaker: Secondary | ICD-10-CM | POA: Diagnosis not present

## 2019-09-30 DIAGNOSIS — I480 Paroxysmal atrial fibrillation: Secondary | ICD-10-CM | POA: Diagnosis not present

## 2019-09-30 DIAGNOSIS — M4802 Spinal stenosis, cervical region: Secondary | ICD-10-CM | POA: Diagnosis not present

## 2019-09-30 DIAGNOSIS — G9340 Encephalopathy, unspecified: Secondary | ICD-10-CM | POA: Diagnosis not present

## 2019-09-30 DIAGNOSIS — K31819 Angiodysplasia of stomach and duodenum without bleeding: Secondary | ICD-10-CM | POA: Diagnosis not present

## 2019-09-30 DIAGNOSIS — R918 Other nonspecific abnormal finding of lung field: Secondary | ICD-10-CM | POA: Diagnosis not present

## 2019-09-30 DIAGNOSIS — E041 Nontoxic single thyroid nodule: Secondary | ICD-10-CM | POA: Diagnosis not present

## 2019-09-30 DIAGNOSIS — Z8719 Personal history of other diseases of the digestive system: Secondary | ICD-10-CM | POA: Diagnosis not present

## 2019-09-30 DIAGNOSIS — Z7901 Long term (current) use of anticoagulants: Secondary | ICD-10-CM | POA: Diagnosis not present

## 2019-09-30 DIAGNOSIS — Z9181 History of falling: Secondary | ICD-10-CM | POA: Diagnosis not present

## 2019-09-30 DIAGNOSIS — H9190 Unspecified hearing loss, unspecified ear: Secondary | ICD-10-CM | POA: Diagnosis not present

## 2019-09-30 DIAGNOSIS — Z952 Presence of prosthetic heart valve: Secondary | ICD-10-CM | POA: Diagnosis not present

## 2019-09-30 DIAGNOSIS — Z8701 Personal history of pneumonia (recurrent): Secondary | ICD-10-CM | POA: Diagnosis not present

## 2019-09-30 DIAGNOSIS — M50222 Other cervical disc displacement at C5-C6 level: Secondary | ICD-10-CM | POA: Diagnosis not present

## 2019-09-30 DIAGNOSIS — I495 Sick sinus syndrome: Secondary | ICD-10-CM | POA: Diagnosis not present

## 2019-09-30 DIAGNOSIS — M47812 Spondylosis without myelopathy or radiculopathy, cervical region: Secondary | ICD-10-CM | POA: Diagnosis not present

## 2019-09-30 DIAGNOSIS — I130 Hypertensive heart and chronic kidney disease with heart failure and stage 1 through stage 4 chronic kidney disease, or unspecified chronic kidney disease: Secondary | ICD-10-CM | POA: Diagnosis not present

## 2019-09-30 DIAGNOSIS — I2510 Atherosclerotic heart disease of native coronary artery without angina pectoris: Secondary | ICD-10-CM | POA: Diagnosis not present

## 2019-09-30 DIAGNOSIS — I509 Heart failure, unspecified: Secondary | ICD-10-CM | POA: Diagnosis not present

## 2019-09-30 DIAGNOSIS — J45909 Unspecified asthma, uncomplicated: Secondary | ICD-10-CM

## 2019-09-30 DIAGNOSIS — G319 Degenerative disease of nervous system, unspecified: Secondary | ICD-10-CM | POA: Diagnosis not present

## 2019-09-30 DIAGNOSIS — I700 Atherosclerosis of aorta: Secondary | ICD-10-CM | POA: Diagnosis not present

## 2019-09-30 DIAGNOSIS — J449 Chronic obstructive pulmonary disease, unspecified: Secondary | ICD-10-CM | POA: Diagnosis not present

## 2019-09-30 LAB — CMP14+EGFR
ALT: 16 IU/L (ref 0–32)
AST: 26 IU/L (ref 0–40)
Albumin/Globulin Ratio: 1.3 (ref 1.2–2.2)
Albumin: 3.7 g/dL (ref 3.5–4.6)
Alkaline Phosphatase: 85 IU/L (ref 39–117)
BUN/Creatinine Ratio: 21 (ref 12–28)
BUN: 28 mg/dL (ref 10–36)
Bilirubin Total: 0.5 mg/dL (ref 0.0–1.2)
CO2: 25 mmol/L (ref 20–29)
Calcium: 9.1 mg/dL (ref 8.7–10.3)
Chloride: 96 mmol/L (ref 96–106)
Creatinine, Ser: 1.32 mg/dL — ABNORMAL HIGH (ref 0.57–1.00)
GFR calc Af Amer: 41 mL/min/{1.73_m2} — ABNORMAL LOW (ref >59)
GFR calc non Af Amer: 36 mL/min/{1.73_m2} — ABNORMAL LOW (ref >59)
Globulin, Total: 2.8 g/dL (ref 1.5–4.5)
Glucose: 96 mg/dL (ref 65–99)
Potassium: 3.8 mmol/L (ref 3.5–5.2)
Sodium: 136 mmol/L (ref 134–144)
Total Protein: 6.5 g/dL (ref 6.0–8.5)

## 2019-09-30 LAB — LIPID PANEL
Chol/HDL Ratio: 2.2 ratio (ref 0.0–4.4)
Cholesterol, Total: 129 mg/dL (ref 100–199)
HDL: 59 mg/dL (ref >39)
LDL Chol Calc (NIH): 51 mg/dL (ref 0–99)
Triglycerides: 101 mg/dL (ref 0–149)
VLDL Cholesterol Cal: 19 mg/dL (ref 5–40)

## 2019-09-30 LAB — CBC WITH DIFFERENTIAL/PLATELET
Basophils Absolute: 0.1 10*3/uL (ref 0.0–0.2)
Basos: 1 %
EOS (ABSOLUTE): 0.1 10*3/uL (ref 0.0–0.4)
Eos: 1 %
Hematocrit: 27.8 % — ABNORMAL LOW (ref 34.0–46.6)
Hemoglobin: 9 g/dL — ABNORMAL LOW (ref 11.1–15.9)
Immature Grans (Abs): 0 10*3/uL (ref 0.0–0.1)
Immature Granulocytes: 1 %
Lymphocytes Absolute: 0.9 10*3/uL (ref 0.7–3.1)
Lymphs: 12 %
MCH: 31.8 pg (ref 26.6–33.0)
MCHC: 32.4 g/dL (ref 31.5–35.7)
MCV: 98 fL — ABNORMAL HIGH (ref 79–97)
Monocytes Absolute: 0.8 10*3/uL (ref 0.1–0.9)
Monocytes: 10 %
Neutrophils Absolute: 6 10*3/uL (ref 1.4–7.0)
Neutrophils: 75 %
Platelets: 272 10*3/uL (ref 150–450)
RBC: 2.83 x10E6/uL — ABNORMAL LOW (ref 3.77–5.28)
RDW: 12.5 % (ref 11.7–15.4)
WBC: 8 10*3/uL (ref 3.4–10.8)

## 2019-09-30 LAB — THYROID PANEL WITH TSH
Free Thyroxine Index: 2.5 (ref 1.2–4.9)
T3 Uptake Ratio: 30 % (ref 24–39)
T4, Total: 8.2 ug/dL (ref 4.5–12.0)
TSH: 4.27 u[IU]/mL (ref 0.450–4.500)

## 2019-09-30 NOTE — Chronic Care Management (AMB) (Signed)
Chronic Care Management   Initial Visit Note  09/30/2019 Name: Crystal Cooper MRN: [id number] DOB: [birth year]  Referred by: Crystal Fraise, MD Reason for referral : Chronic Care Management (Initial Visit)   Crystal Cooper is a 86 y.o. year old female who is a primary care patient of Crystal Cooper, Crystal Gash, MD. The CCM team was consulted for assistance with chronic disease management and care coordination needs related to Osteoporosis, HTN, CKD, CHF, Asthma, PAF, ASCVD, hearing loss, CVA.  Review of patient status, including review of consultants reports, relevant laboratory and other test results, and collaboration with appropriate care team members and the patient's provider was performed as part of comprehensive patient evaluation and provision of chronic care management services.    Subjective: I reached out to Crystal Cooper by telephone today and spoke with her daughter, Crystal Cooper regarding her chronic medical conditions.   SDOH (Social Determinants of Health) assessments performed: Yes See Care Plan activities for detailed interventions related to SDOH     Objective: Outpatient Encounter Medications as of 09/30/2019  Medication Sig Note  . acetaminophen (TYLENOL) 500 MG tablet Take 500 mg by mouth every 6 (six) hours as needed for mild pain or headache.    . albuterol (PROAIR HFA) 108 (90 Base) MCG/ACT inhaler Inhale 2 puffs into the lungs every 4 (four) hours as needed for wheezing or shortness of breath.   [name] apixaban (ELIQUIS) 2.5 MG TABS tablet Take 1 tablet (2.5 mg total) by mouth 2 (two) times daily.   . Biotin 10 MG CAPS Take 10 mg by mouth daily.    . Calcium Carb-Cholecalciferol (CALTRATE 600+D3) 600-800 MG-UNIT TABS Take 1 tablet by mouth daily.   [name] docusate sodium (COLACE) 100 MG capsule Take 100 mg by mouth daily as needed for mild constipation.   . enalapril (VASOTEC) 2.5 MG tablet Take 2.5 mg by mouth daily.   . ferrous sulfate 325 (65 FE) MG tablet Take 1 tablet (325 mg total) by  mouth 2 (two) times daily with a meal.   . fluticasone furoate-vilanterol (BREO ELLIPTA) 100-25 MCG/INH AEPB Inhale 1 puff into the lungs daily.   . furosemide (LASIX) 40 MG tablet Take 1.5 tablets (60 mg total) by mouth daily. 08/23/2019: 1.5 tablets = 60 mg  . metoprolol tartrate (LOPRESSOR) 25 MG tablet Take 1 tablet (25 mg total) by mouth 2 (two) times daily.   . multivitamin-iron-minerals-folic acid (CENTRUM) chewable tablet Chew 1 tablet by mouth daily.   . pantoprazole (PROTONIX) 40 MG tablet Take 1 tablet (40 mg total) by mouth 2 (two) times daily before a meal.    No facility-administered encounter medications on file as of 09/30/2019.      RN Care Plan   . Chronic Disease Management Needs       CARE PLAN ENTRY (see longtitudinal plan of care for additional care plan information)  Current Barriers:  . Chronic Disease Management support, education, and care coordination needs related to Osteoporosis, HTN, CKD, CHF, Asthma, PAF, ASCVD, hearing loss, CVA  Clinical Goal(s) related to Osteoporosis, HTN, CKD, CHF, Asthma, PAF, ASCVD, hearing loss, CVA:  Over the next 45 days, patient will:  . Work with the care management team to address educational, disease management, and care coordination needs  . Begin or continue self health monitoring activities as directed today Measure and record weight daily . Call provider office for new or worsened signs and symptoms Weight outside established parameters and Shortness of breath . Call care management team with questions or  concerns . Verbalize basic understanding of patient centered plan of care established today  Interventions related to Osteoporosis, HTN, CKD, CHF, Asthma, PAF, ASCVD, hearing loss, CVA:  . Evaluation of current treatment plans and patient's adherence to plan as established by provider . Assessed patient understanding of disease states . Assessed patient's education and care coordination needs . Provided disease specific  education to patient  . Discussed pressure sores on buttocks and current treatment. Encouraged them to purchase a gel or foam seat cushion.  Crystal Cooper with appropriate clinical care team members regarding patient needs . Provided with RN Care Manager telephone number and encouraged to reach out as needed  Patient Self Care Activities related to Osteoporosis, HTN, CKD, CHF, Asthma, PAF, ASCVD, hearing loss, CVA:  . Patient is unable to independently self-manage chronic health conditions  Initial goal documentation        Follow-up Plan:   The care management team will reach out to the patient again over the next 45 days.   Crystal Cooper, BSN, RN-BC Embedded Chronic Care Manager [facility] / [place] Management Direct Dial: [phone]

## 2019-09-30 NOTE — Patient Instructions (Signed)
Visit Information  Goals Addressed            This Visit's Progress   . Chronic Disease Management Needs       CARE PLAN ENTRY (see longtitudinal plan of care for additional care plan information)  Current Barriers:  . Chronic Disease Management support, education, and care coordination needs related to Osteoporosis, HTN, CKD, CHF, Asthma, PAF, ASCVD, hearing loss, CVA  Clinical Goal(s) related to Osteoporosis, HTN, CKD, CHF, Asthma, PAF, ASCVD, hearing loss, CVA:  Over the next 45 days, patient will:  . Work with the care management team to address educational, disease management, and care coordination needs  . Begin or continue self health monitoring activities as directed today Measure and record weight daily . Call provider office for new or worsened signs and symptoms Weight outside established parameters and Shortness of breath . Call care management team with questions or concerns . Verbalize basic understanding of patient centered plan of care established today  Interventions related to Osteoporosis, HTN, CKD, CHF, Asthma, PAF, ASCVD, hearing loss, CVA:  . Evaluation of current treatment plans and patient's adherence to plan as established by provider . Assessed patient understanding of disease states . Assessed patient's education and care coordination needs . Provided disease specific education to patient  . Collaborated with appropriate clinical care team members regarding patient needs . Provided with RN Care Manager telephone number and encouraged to reach out as needed  Patient Self Care Activities related to Osteoporosis, HTN, CKD, CHF, Asthma, PAF, ASCVD, hearing loss, CVA:  . Patient is unable to independently self-manage chronic health conditions  Initial goal documentation        The patient verbalized understanding of instructions provided today and declined a print copy of patient instruction materials.   Follow-up Plan The care management team will  reach out to the patient again over the next 45 days.   [name], BSN, RN-BC Embedded Chronic Care Manager [facility] / [place] [facility] Direct Dial: [phone]

## 2019-09-30 NOTE — Progress Notes (Signed)
Cholesterol is good. Thyroid function is normal.  Renal function is still declined but has slightly improved from last check.  Hgb and Hct have dropped slightly. Recheck in 2 weeks to see if this continues to drop.

## 2019-10-01 ENCOUNTER — Other Ambulatory Visit: Payer: Self-pay | Admitting: *Deleted

## 2019-10-01 DIAGNOSIS — D509 Iron deficiency anemia, unspecified: Secondary | ICD-10-CM

## 2019-10-02 DIAGNOSIS — N183 Chronic kidney disease, stage 3 unspecified: Secondary | ICD-10-CM | POA: Diagnosis not present

## 2019-10-02 DIAGNOSIS — I69351 Hemiplegia and hemiparesis following cerebral infarction affecting right dominant side: Secondary | ICD-10-CM | POA: Diagnosis not present

## 2019-10-02 DIAGNOSIS — J449 Chronic obstructive pulmonary disease, unspecified: Secondary | ICD-10-CM | POA: Diagnosis not present

## 2019-10-02 DIAGNOSIS — I130 Hypertensive heart and chronic kidney disease with heart failure and stage 1 through stage 4 chronic kidney disease, or unspecified chronic kidney disease: Secondary | ICD-10-CM | POA: Diagnosis not present

## 2019-10-02 DIAGNOSIS — I509 Heart failure, unspecified: Secondary | ICD-10-CM | POA: Diagnosis not present

## 2019-10-02 DIAGNOSIS — I2510 Atherosclerotic heart disease of native coronary artery without angina pectoris: Secondary | ICD-10-CM | POA: Diagnosis not present

## 2019-10-05 ENCOUNTER — Telehealth: Payer: Self-pay | Admitting: Family Medicine

## 2019-10-05 DIAGNOSIS — J441 Chronic obstructive pulmonary disease with (acute) exacerbation: Secondary | ICD-10-CM | POA: Diagnosis not present

## 2019-10-05 DIAGNOSIS — R350 Frequency of micturition: Secondary | ICD-10-CM | POA: Diagnosis not present

## 2019-10-05 DIAGNOSIS — N3000 Acute cystitis without hematuria: Secondary | ICD-10-CM | POA: Diagnosis not present

## 2019-10-05 DIAGNOSIS — R0602 Shortness of breath: Secondary | ICD-10-CM | POA: Diagnosis not present

## 2019-10-05 NOTE — Telephone Encounter (Signed)
Urine has very strong smell and she has edema in feet and ankles.  We do not have an appointment available until Thursday (3 more days), so I advised her to take her mom to urgent care.

## 2019-10-05 NOTE — Telephone Encounter (Signed)
Left message to call back  

## 2019-10-06 ENCOUNTER — Other Ambulatory Visit: Payer: Self-pay | Admitting: Family Medicine

## 2019-10-06 ENCOUNTER — Telehealth: Payer: Self-pay | Admitting: Family Medicine

## 2019-10-06 DIAGNOSIS — I2510 Atherosclerotic heart disease of native coronary artery without angina pectoris: Secondary | ICD-10-CM | POA: Diagnosis not present

## 2019-10-06 DIAGNOSIS — J449 Chronic obstructive pulmonary disease, unspecified: Secondary | ICD-10-CM | POA: Diagnosis not present

## 2019-10-06 DIAGNOSIS — I130 Hypertensive heart and chronic kidney disease with heart failure and stage 1 through stage 4 chronic kidney disease, or unspecified chronic kidney disease: Secondary | ICD-10-CM | POA: Diagnosis not present

## 2019-10-06 DIAGNOSIS — I509 Heart failure, unspecified: Secondary | ICD-10-CM | POA: Diagnosis not present

## 2019-10-06 DIAGNOSIS — N183 Chronic kidney disease, stage 3 unspecified: Secondary | ICD-10-CM | POA: Diagnosis not present

## 2019-10-06 DIAGNOSIS — I69351 Hemiplegia and hemiparesis following cerebral infarction affecting right dominant side: Secondary | ICD-10-CM | POA: Diagnosis not present

## 2019-10-06 MED ORDER — AMOXICILLIN 500 MG PO CAPS: 500.0000 mg | ORAL_CAPSULE | Freq: Three times a day (TID) | ORAL | 0 refills | Status: DC

## 2019-10-06 NOTE — Telephone Encounter (Signed)
Have her DC the cipro. I sent in amoxil instead after checking it against her allergies and meds for reactions - it should be fine. WS

## 2019-10-06 NOTE — Telephone Encounter (Signed)
[place] aware of provider feedback and voiced understanding.

## 2019-10-08 DIAGNOSIS — J449 Chronic obstructive pulmonary disease, unspecified: Secondary | ICD-10-CM | POA: Diagnosis not present

## 2019-10-08 DIAGNOSIS — N183 Chronic kidney disease, stage 3 unspecified: Secondary | ICD-10-CM | POA: Diagnosis not present

## 2019-10-08 DIAGNOSIS — I130 Hypertensive heart and chronic kidney disease with heart failure and stage 1 through stage 4 chronic kidney disease, or unspecified chronic kidney disease: Secondary | ICD-10-CM | POA: Diagnosis not present

## 2019-10-08 DIAGNOSIS — I509 Heart failure, unspecified: Secondary | ICD-10-CM | POA: Diagnosis not present

## 2019-10-08 DIAGNOSIS — I2510 Atherosclerotic heart disease of native coronary artery without angina pectoris: Secondary | ICD-10-CM | POA: Diagnosis not present

## 2019-10-08 DIAGNOSIS — I69351 Hemiplegia and hemiparesis following cerebral infarction affecting right dominant side: Secondary | ICD-10-CM | POA: Diagnosis not present

## 2019-10-10 ENCOUNTER — Encounter (HOSPITAL_COMMUNITY): Payer: Self-pay | Admitting: Emergency Medicine

## 2019-10-10 ENCOUNTER — Emergency Department (HOSPITAL_COMMUNITY): Payer: Medicare Other

## 2019-10-10 ENCOUNTER — Other Ambulatory Visit: Payer: Self-pay

## 2019-10-10 ENCOUNTER — Inpatient Hospital Stay (HOSPITAL_COMMUNITY)
Admission: EM | Admit: 2019-10-10 | Discharge: 2019-10-13 | DRG: 378 | Disposition: A | Discharge status: Home / Self Care | Payer: Medicare Other | Attending: Internal Medicine | Admitting: Internal Medicine

## 2019-10-10 DIAGNOSIS — E876 Hypokalemia: Secondary | ICD-10-CM | POA: Diagnosis present

## 2019-10-10 DIAGNOSIS — I350 Nonrheumatic aortic (valve) stenosis: Secondary | ICD-10-CM | POA: Diagnosis present

## 2019-10-10 DIAGNOSIS — D509 Iron deficiency anemia, unspecified: Secondary | ICD-10-CM | POA: Diagnosis present

## 2019-10-10 DIAGNOSIS — Z8673 Personal history of transient ischemic attack (TIA), and cerebral infarction without residual deficits: Secondary | ICD-10-CM | POA: Diagnosis not present

## 2019-10-10 DIAGNOSIS — Z20822 Contact with and (suspected) exposure to covid-19: Secondary | ICD-10-CM | POA: Diagnosis not present

## 2019-10-10 DIAGNOSIS — K922 Gastrointestinal hemorrhage, unspecified: Secondary | ICD-10-CM | POA: Diagnosis not present

## 2019-10-10 DIAGNOSIS — J45909 Unspecified asthma, uncomplicated: Secondary | ICD-10-CM | POA: Diagnosis present

## 2019-10-10 DIAGNOSIS — Z885 Allergy status to narcotic agent status: Secondary | ICD-10-CM | POA: Diagnosis not present

## 2019-10-10 DIAGNOSIS — D62 Acute posthemorrhagic anemia: Secondary | ICD-10-CM | POA: Diagnosis present

## 2019-10-10 DIAGNOSIS — K921 Melena: Secondary | ICD-10-CM | POA: Diagnosis not present

## 2019-10-10 DIAGNOSIS — M1990 Unspecified osteoarthritis, unspecified site: Secondary | ICD-10-CM | POA: Diagnosis present

## 2019-10-10 DIAGNOSIS — D649 Anemia, unspecified: Principal | ICD-10-CM

## 2019-10-10 DIAGNOSIS — I495 Sick sinus syndrome: Secondary | ICD-10-CM | POA: Diagnosis present

## 2019-10-10 DIAGNOSIS — Z950 Presence of cardiac pacemaker: Secondary | ICD-10-CM | POA: Diagnosis not present

## 2019-10-10 DIAGNOSIS — H409 Unspecified glaucoma: Secondary | ICD-10-CM | POA: Diagnosis present

## 2019-10-10 DIAGNOSIS — N179 Acute kidney failure, unspecified: Secondary | ICD-10-CM | POA: Diagnosis not present

## 2019-10-10 DIAGNOSIS — I5032 Chronic diastolic (congestive) heart failure: Secondary | ICD-10-CM | POA: Diagnosis present

## 2019-10-10 DIAGNOSIS — N390 Urinary tract infection, site not specified: Secondary | ICD-10-CM | POA: Diagnosis present

## 2019-10-10 DIAGNOSIS — Z7951 Long term (current) use of inhaled steroids: Secondary | ICD-10-CM | POA: Diagnosis not present

## 2019-10-10 DIAGNOSIS — K31811 Angiodysplasia of stomach and duodenum with bleeding: Principal | ICD-10-CM | POA: Diagnosis present

## 2019-10-10 DIAGNOSIS — I10 Essential (primary) hypertension: Secondary | ICD-10-CM | POA: Diagnosis not present

## 2019-10-10 DIAGNOSIS — Z7901 Long term (current) use of anticoagulants: Secondary | ICD-10-CM

## 2019-10-10 DIAGNOSIS — K264 Chronic or unspecified duodenal ulcer with hemorrhage: Secondary | ICD-10-CM | POA: Diagnosis not present

## 2019-10-10 DIAGNOSIS — K625 Hemorrhage of anus and rectum: Secondary | ICD-10-CM | POA: Diagnosis not present

## 2019-10-10 DIAGNOSIS — H9190 Unspecified hearing loss, unspecified ear: Secondary | ICD-10-CM | POA: Diagnosis present

## 2019-10-10 DIAGNOSIS — R0602 Shortness of breath: Secondary | ICD-10-CM | POA: Diagnosis not present

## 2019-10-10 DIAGNOSIS — Z886 Allergy status to analgesic agent status: Secondary | ICD-10-CM | POA: Diagnosis not present

## 2019-10-10 DIAGNOSIS — N1832 Chronic kidney disease, stage 3b: Secondary | ICD-10-CM | POA: Diagnosis not present

## 2019-10-10 DIAGNOSIS — I480 Paroxysmal atrial fibrillation: Secondary | ICD-10-CM | POA: Diagnosis not present

## 2019-10-10 DIAGNOSIS — I130 Hypertensive heart and chronic kidney disease with heart failure and stage 1 through stage 4 chronic kidney disease, or unspecified chronic kidney disease: Secondary | ICD-10-CM | POA: Diagnosis present

## 2019-10-10 DIAGNOSIS — I4891 Unspecified atrial fibrillation: Secondary | ICD-10-CM | POA: Diagnosis not present

## 2019-10-10 DIAGNOSIS — N1831 Chronic kidney disease, stage 3a: Secondary | ICD-10-CM

## 2019-10-10 DIAGNOSIS — N183 Chronic kidney disease, stage 3 unspecified: Secondary | ICD-10-CM | POA: Diagnosis present

## 2019-10-10 DIAGNOSIS — Z8249 Family history of ischemic heart disease and other diseases of the circulatory system: Secondary | ICD-10-CM | POA: Diagnosis not present

## 2019-10-10 DIAGNOSIS — K31819 Angiodysplasia of stomach and duodenum without bleeding: Secondary | ICD-10-CM | POA: Diagnosis not present

## 2019-10-10 DIAGNOSIS — Z862 Personal history of diseases of the blood and blood-forming organs and certain disorders involving the immune mechanism: Secondary | ICD-10-CM | POA: Diagnosis not present

## 2019-10-10 DIAGNOSIS — K219 Gastro-esophageal reflux disease without esophagitis: Secondary | ICD-10-CM | POA: Diagnosis present

## 2019-10-10 DIAGNOSIS — I517 Cardiomegaly: Secondary | ICD-10-CM | POA: Diagnosis not present

## 2019-10-10 DIAGNOSIS — I129 Hypertensive chronic kidney disease with stage 1 through stage 4 chronic kidney disease, or unspecified chronic kidney disease: Secondary | ICD-10-CM | POA: Diagnosis not present

## 2019-10-10 DIAGNOSIS — R300 Dysuria: Secondary | ICD-10-CM | POA: Diagnosis not present

## 2019-10-10 DIAGNOSIS — I509 Heart failure, unspecified: Secondary | ICD-10-CM

## 2019-10-10 LAB — IRON AND TIBC
Iron: 52 ug/dL (ref 28–170)
Saturation Ratios: 15 % (ref 10.4–31.8)
TIBC: 349 ug/dL (ref 250–450)
UIBC: 297 ug/dL

## 2019-10-10 LAB — BASIC METABOLIC PANEL
Anion gap: 9 (ref 5–15)
BUN: 29 mg/dL — ABNORMAL HIGH (ref 8–23)
CO2: 28 mmol/L (ref 22–32)
Calcium: 8.5 mg/dL — ABNORMAL LOW (ref 8.9–10.3)
Chloride: 96 mmol/L — ABNORMAL LOW (ref 98–111)
Creatinine, Ser: 1.6 mg/dL — ABNORMAL HIGH (ref 0.44–1.00)
GFR calc Af Amer: 33 mL/min — ABNORMAL LOW (ref >60)
GFR calc non Af Amer: 28 mL/min — ABNORMAL LOW (ref >60)
Glucose, Bld: 148 mg/dL — ABNORMAL HIGH (ref 70–99)
Potassium: 3.7 mmol/L (ref 3.5–5.1)
Sodium: 133 mmol/L — ABNORMAL LOW (ref 135–145)

## 2019-10-10 LAB — CBC WITH DIFFERENTIAL/PLATELET
Abs Immature Granulocytes: 0.15 10*3/uL — ABNORMAL HIGH (ref 0.00–0.07)
Basophils Absolute: 0 10*3/uL (ref 0.0–0.1)
Basophils Relative: 0 %
Eosinophils Absolute: 0 10*3/uL (ref 0.0–0.5)
Eosinophils Relative: 0 %
HCT: 25.1 % — ABNORMAL LOW (ref 36.0–46.0)
Hemoglobin: 7.6 g/dL — ABNORMAL LOW (ref 12.0–15.0)
Immature Granulocytes: 2 %
Lymphocytes Relative: 8 %
Lymphs Abs: 0.6 10*3/uL — ABNORMAL LOW (ref 0.7–4.0)
MCH: 30.9 pg (ref 26.0–34.0)
MCHC: 30.3 g/dL (ref 30.0–36.0)
MCV: 102 fL — ABNORMAL HIGH (ref 80.0–100.0)
Monocytes Absolute: 0.2 10*3/uL (ref 0.1–1.0)
Monocytes Relative: 2 %
Neutro Abs: 6.7 10*3/uL (ref 1.7–7.7)
Neutrophils Relative %: 88 %
Platelets: 297 10*3/uL (ref 150–400)
RBC: 2.46 MIL/uL — ABNORMAL LOW (ref 3.87–5.11)
RDW: 13.4 % (ref 11.5–15.5)
WBC: 7.7 10*3/uL (ref 4.0–10.5)
nRBC: 0 % (ref 0.0–0.2)

## 2019-10-10 LAB — PREPARE RBC (CROSSMATCH)

## 2019-10-10 LAB — ABO/RH: ABO/RH(D): A POS

## 2019-10-10 LAB — RETICULOCYTES
Immature Retic Fract: 31.9 % — ABNORMAL HIGH (ref 2.3–15.9)
RBC.: 2.44 MIL/uL — ABNORMAL LOW (ref 3.87–5.11)
Retic Count, Absolute: 112.7 10*3/uL (ref 19.0–186.0)
Retic Ct Pct: 4.6 % — ABNORMAL HIGH (ref 0.4–3.1)

## 2019-10-10 LAB — FERRITIN: Ferritin: 17 ng/mL (ref 11–307)

## 2019-10-10 LAB — VITAMIN B12: Vitamin B-12: 750 pg/mL (ref 180–914)

## 2019-10-10 MED ORDER — IPRATROPIUM BROMIDE 0.02 % IN SOLN: 0.5000 mg | Freq: Four times a day (QID) | RESPIRATORY_TRACT | Status: DC

## 2019-10-10 MED ORDER — PANTOPRAZOLE SODIUM 40 MG IV SOLR
40.0000 mg | Freq: Once | INTRAVENOUS | Status: AC
  Administered 2019-10-10: 16:00:00 40 mg via INTRAVENOUS
  Filled 2019-10-10: qty 40

## 2019-10-10 MED ORDER — ALBUTEROL SULFATE (2.5 MG/3ML) 0.083% IN NEBU: 2.5000 mg | INHALATION_SOLUTION | Freq: Four times a day (QID) | RESPIRATORY_TRACT | Status: DC

## 2019-10-10 MED ORDER — PANTOPRAZOLE SODIUM 40 MG IV SOLR
INTRAVENOUS | Status: AC
  Filled 2019-10-10: qty 80

## 2019-10-10 MED ORDER — ALBUTEROL SULFATE HFA 108 (90 BASE) MCG/ACT IN AERS: 2.0000 | INHALATION_SPRAY | RESPIRATORY_TRACT | Status: DC | PRN

## 2019-10-10 MED ORDER — SODIUM CHLORIDE 0.9 % IV SOLN
10.0000 mL/h | Freq: Once | INTRAVENOUS | Status: AC
  Administered 2019-10-10: 20:00:00 10 mL/h via INTRAVENOUS

## 2019-10-10 MED ORDER — IPRATROPIUM-ALBUTEROL 0.5-2.5 (3) MG/3ML IN SOLN: 3.0000 mL | Freq: Four times a day (QID) | RESPIRATORY_TRACT | Status: DC | PRN

## 2019-10-10 MED ORDER — FLUTICASONE FUROATE-VILANTEROL 100-25 MCG/INH IN AEPB
1.0000 | INHALATION_SPRAY | Freq: Every day | RESPIRATORY_TRACT | Status: DC
  Administered 2019-10-10 – 2019-10-13 (×3): 1 via RESPIRATORY_TRACT
  Filled 2019-10-10: qty 28

## 2019-10-10 MED ORDER — SODIUM CHLORIDE 0.9 % IV SOLN
8.0000 mg/h | INTRAVENOUS | Status: DC
  Administered 2019-10-10 – 2019-10-12 (×4): 8 mg/h via INTRAVENOUS
  Filled 2019-10-10 (×10): qty 80

## 2019-10-10 MED ORDER — ADULT MULTIVITAMIN W/MINERALS CH
1.0000 | ORAL_TABLET | Freq: Every day | ORAL | Status: DC
  Administered 2019-10-11 – 2019-10-13 (×3): 1 via ORAL
  Filled 2019-10-10 (×3): qty 1

## 2019-10-10 MED ORDER — ACETAMINOPHEN 500 MG PO TABS: 500.0000 mg | ORAL_TABLET | Freq: Four times a day (QID) | ORAL | Status: DC | PRN

## 2019-10-10 MED ORDER — PANTOPRAZOLE SODIUM 40 MG IV SOLR: 40.0000 mg | Freq: Two times a day (BID) | INTRAVENOUS | Status: DC

## 2019-10-10 MED ORDER — FUROSEMIDE 40 MG PO TABS
60.0000 mg | ORAL_TABLET | Freq: Every day | ORAL | Status: DC
  Administered 2019-10-10 – 2019-10-13 (×4): 60 mg via ORAL
  Filled 2019-10-10 (×4): qty 1

## 2019-10-10 MED ORDER — AMOXICILLIN 250 MG PO CAPS
500.0000 mg | ORAL_CAPSULE | Freq: Three times a day (TID) | ORAL | Status: DC
  Administered 2019-10-10 – 2019-10-11 (×2): 500 mg via ORAL
  Filled 2019-10-10 (×2): qty 2

## 2019-10-10 MED ORDER — CALCIUM CARBONATE-VITAMIN D 500-200 MG-UNIT PO TABS
ORAL_TABLET | Freq: Every day | ORAL | Status: DC
  Administered 2019-10-10 – 2019-10-13 (×4): 1 via ORAL
  Filled 2019-10-10 (×4): qty 1

## 2019-10-10 MED ORDER — BIOTIN 10 MG PO CAPS: 10.0000 mg | ORAL_CAPSULE | Freq: Every day | ORAL | Status: DC

## 2019-10-10 MED ORDER — ALBUTEROL SULFATE (2.5 MG/3ML) 0.083% IN NEBU: 2.5000 mg | INHALATION_SOLUTION | RESPIRATORY_TRACT | Status: DC | PRN

## 2019-10-10 MED ORDER — METOPROLOL TARTRATE 25 MG PO TABS
25.0000 mg | ORAL_TABLET | Freq: Two times a day (BID) | ORAL | Status: DC
  Administered 2019-10-10 – 2019-10-13 (×5): 25 mg via ORAL
  Filled 2019-10-10 (×6): qty 1

## 2019-10-10 NOTE — ED Provider Notes (Signed)
[facility] EMERGENCY DEPARTMENT Provider Note   CSN: [id number] Arrival date & time: 10/10/19  1416     History Chief Complaint  Patient presents with  . Abnormal Lab    Crystal Cooper is a 86 y.o. female with PMHx HTN, CHF with EF 60-65%, PAF on Eliquis, CVA, hx of GI bleed who presents to the ED today from [facility] for abnormal labwork.   Per chart review patient presented to urgent care today with complaints of melanotic stools as well as bright red blood per rectum for the past 2 to 3 days.  She was fecal occult positive at urgent care.  Her hemoglobin was noted to be 6.8 and she was advised to come to the emergency department for further eval with likely blood transfusion.   Recently seen in the ED on 03/02 for altered mental status.  Found to have an acute multifocal CVA s/2 A fib not on anticoagulation due to hx of GI bleed.  Reports she was discharged on Eliquis with CBCs q. 2 weeks to check hemoglobin with history of GI bleed.  Neurology thought it would be more beneficial to have patient on her Eliquis given likelihood of repeat strokes without anticoagulation.   She also had an office visit on 4/12 at urgent care with complaints of shortness of breath with some wheezing as well as urinary frequency.  Culture grew Citrobacter patient was discharged on amoxicillin.  Unable to see chest x-ray results however patient treated with 80 mg Im Solumedrol and prednisone taper 10 mg.  Daughter reports patient has continued to be short of breath.  Is also concerned as patient has been having difficulty urinating.  She states that her mom will sit on the toilet for approximately 40 minutes and only void a small amount.  Patient has denied any abdominal pain.  No fevers or chills.    The history is provided by the patient, a relative and medical records.       Past Medical History:  Diagnosis Date  . Anemia    years ago  . Aortic stenosis   . Arthritis   . Asthma   . Atrial fibrillation  ([place])   . CHF (congestive heart failure) ([place]) 11/2014  . Family history of adverse reaction to anesthesia    2 daughters would have N/V  . Glaucoma   . Hearing loss   . Heart murmur   . HTN (hypertension)   . Pneumonia   . Prolapsing mitral leaflet syndrome   . Shortness of breath dyspnea    with exertion  . SVT (supraventricular tachycardia) ([facility])    S/P ablation of AVNRT in 2003    Patient Active Problem List   Diagnosis Date Noted  . Wheezing 09/23/2019  . Abnormal breath sounds 09/09/2019  . Heme positive stool   . Acute CVA (cerebrovascular accident) ([place]) 08/27/2019  . PNA (pneumonia) 08/25/2019  . Hypokalemia 08/23/2019  . Angio-edema, initial encounter 08/23/2019  . Reactive airway disease 03/24/2019  . GERD without esophagitis 03/24/2019  . CKD (chronic kidney disease), stage III 11/12/2018  . GI bleed 05/12/2018  . 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 Diastolic dysfunction 86/76/1948  . Pacemaker 05/12/2018  . Iron deficiency anemia   . Melena   . Duodenal arteriovenous malformation   . Gastritis and gastroduodenitis   . Normocytic anemia 04/06/2018  . Osteoporosis 08/27/2016  . ASCVD (arteriosclerotic cardiovascular disease) 01/11/2016  . Asthma 01/11/2016  . Glaucoma 01/11/2016  . Hearing loss  01/11/2016  . Other nonrheumatic mitral valve disorders 01/11/2016  . Junctional bradycardia   . PAF (paroxysmal atrial fibrillation) ([place]) 09/06/2015  . Hyponatremia 12/17/2014  . Congestive heart failure ([place]) 12/12/2014  . Murmur 10/19/2011  . Chest pain 10/19/2011  . Essential hypertension 10/19/2011  . History of cardiac radiofrequency ablation (RFA) 10/19/2011    Past Surgical History:  Procedure Laterality Date  . APPENDECTOMY    . BIOPSY  04/07/2018   Procedure: BIOPSY;  Surgeon: Jerene Bears, MD;  Location: [facility] ENDOSCOPY;  Service: Gastroenterology;;  . BLADDER SURGERY    . CARDIAC CATHETERIZATION N/A 09/26/2015    Procedure: Right/Left Heart Cath and Coronary Angiography;  Surgeon: Burnell Blanks, MD;  Location: [place] CV LAB;  Service: Cardiovascular;  Laterality: N/A;  . CARDIAC SURGERY    . CARDIOVERSION N/A 03/02/2016   Procedure: CARDIOVERSION;  Surgeon: Thayer Headings, MD;  Location: [place];  Service: Cardiovascular;  Laterality: N/A;  . EP IMPLANTABLE DEVICE N/A 10/26/2015   Procedure: Pacemaker Implant;  Surgeon: Will Meredith Leeds, MD;  Location: [place] CV LAB;  Service: Cardiovascular;  Laterality: N/A;  . ESOPHAGOGASTRODUODENOSCOPY (EGD) WITH PROPOFOL N/A 04/07/2018   Procedure: ESOPHAGOGASTRODUODENOSCOPY (EGD) WITH PROPOFOL;  Surgeon: Jerene Bears, MD;  Location[facility] ENDOSCOPY;  Service: Gastroenterology;  Laterality: N/A;  . EYE SURGERY Bilateral    cataract surgery  . HOT HEMOSTASIS N/A 04/07/2018   Procedure: HOT HEMOSTASIS (ARGON PLASMA COAGULATION/BICAP);  Surgeon: Jerene Bears, MD;  Location: [facility] ENDOSCOPY;  Service: Gastroenterology;  Laterality: N/A;  . NASAL SINUS SURGERY    . TEE WITHOUT CARDIOVERSION N/A 10/25/2015   Procedure: TRANSESOPHAGEAL ECHOCARDIOGRAM (TEE);  Surgeon: Burnell Blanks, MD;  Location: [place];  Service: Open Heart Surgery;  Laterality: N/A;  . TRANSCATHETER AORTIC VALVE REPLACEMENT, TRANSFEMORAL Right 10/25/2015   Procedure: TRANSCATHETER AORTIC VALVE REPLACEMENT, TRANSFEMORAL;  Surgeon: Burnell Blanks, MD;  Location: [place];  Service: Open Heart Surgery;  Laterality: Right;     OB History    Gravida  3   Para  3   Term  3   Preterm      AB      Living  2     SAB      TAB      Ectopic      Multiple      Live Births              Family History  Problem Relation Age of Onset  . Hypertension Mother   . Pneumonia Father   . Heart attack Neg Hx   . Colon cancer Neg Hx   . Esophageal cancer Neg Hx     Social History   Tobacco Use  . Smoking status: Never Smoker  . Smokeless tobacco: Never Used    Substance Use Topics  . Alcohol use: No    Alcohol/week: 0.0 standard drinks  . Drug use: No    Home Medications Prior to Admission medications   Medication Sig Start Date End Date Taking? Authorizing Provider  acetaminophen (TYLENOL) 500 MG tablet Take 500 mg by mouth every 6 (six) hours as needed for mild pain or headache.     [provider]  albuterol (PROAIR HFA) 108 (90 Base) MCG/ACT inhaler Inhale 2 puffs into the lungs every 4 (four) hours as needed for wheezing or shortness of breath. 08/05/19   Baruch Gouty, FNP  amoxicillin (AMOXIL) 500 MG capsule Take 1 capsule (500 mg total) by mouth  3 (three) times daily. 10/06/19   [name], MD  apixaban (ELIQUIS) 2.5 MG TABS tablet Take 1 tablet (2.5 mg total) by mouth 2 (two) times daily. 08/30/19   Amin, Jeanella Flattery, MD  Biotin 10 MG CAPS Take 10 mg by mouth daily.     [provider]  Calcium Carb-Cholecalciferol (CALTRATE 600+D3) 600-800 MG-UNIT TABS Take 1 tablet by mouth daily.    [provider]  docusate sodium (COLACE) 100 MG capsule Take 100 mg by mouth daily as needed for mild constipation.    [provider]  enalapril (VASOTEC) 2.5 MG tablet Take 2.5 mg by mouth daily.    [provider]  ferrous sulfate 325 (65 FE) MG tablet Take 1 tablet (325 mg total) by mouth 2 (two) times daily with a meal. 04/08/18   Vann, Jessica U, DO  fluticasone furoate-vilanterol (BREO ELLIPTA) 100-25 MCG/INH AEPB Inhale 1 puff into the lungs daily. 09/23/19   Baruch Gouty, FNP  furosemide (LASIX) 40 MG tablet Take 1.5 tablets (60 mg total) by mouth daily. 03/24/19   Baruch Gouty, FNP  metoprolol tartrate (LOPRESSOR) 25 MG tablet Take 1 tablet (25 mg total) by mouth 2 (two) times daily. 05/20/19   Camnitz, Ocie Doyne, MD  multivitamin-iron-minerals-folic acid (CENTRUM) chewable tablet Chew 1 tablet by mouth daily.    [provider]  pantoprazole (PROTONIX) 40 MG tablet Take 1 tablet (40 mg  total) by mouth 2 (two) times daily before a meal. 08/30/19 10/29/19  Amin, Jeanella Flattery, MD    Allergies    Hctz [hydrochlorothiazide], Aspirin, and Codeine  Review of Systems   Review of Systems  Constitutional: Negative for chills and fever.  Respiratory: Positive for shortness of breath.   Cardiovascular: Negative for chest pain.  Gastrointestinal: Positive for blood in stool. Negative for abdominal pain.  Genitourinary: Positive for decreased urine volume.  All other systems reviewed and are negative.   Physical Exam Updated Vital Signs BP 118/61 (BP Location: Right Arm)   Pulse 61   Temp 97.7 F (36.5 C) (Oral)   Resp 16   Ht 5\' 2"  (1.575 m)   Wt 51.3 kg   SpO2 100%   BMI 20.67 kg/m   Physical Exam Vitals and nursing note reviewed.  Constitutional:      Appearance: She is not ill-appearing or diaphoretic.     Comments: Pleasant elderly female  HENT:     Head: Normocephalic and atraumatic.  Eyes:     Conjunctiva/sclera: Conjunctivae normal.  Cardiovascular:     Rate and Rhythm: Normal rate and regular rhythm.     Pulses: Normal pulses.  Pulmonary:     Effort: Tachypnea present.     Breath sounds: Normal breath sounds. No wheezing, rhonchi or rales.     Comments: Mild tachypnea noted Abdominal:     Palpations: Abdomen is soft.     Tenderness: There is no abdominal tenderness. There is no guarding or rebound.  Genitourinary:    Comments: Pt declined repeat GU exam Musculoskeletal:     Cervical back: Neck supple.  Skin:    General: Skin is warm and dry.  Neurological:     Mental Status: She is alert.     ED Results / Procedures / Treatments   Labs (all labs ordered are listed, but only abnormal results are displayed) Labs Reviewed  CBC WITH DIFFERENTIAL/PLATELET - Abnormal; Notable for the following components:      Result Value   RBC 2.46 (*)  Hemoglobin 7.6 (*)    HCT 25.1 (*)    MCV 102.0 (*)    Lymphs Abs 0.6 (*)    Abs Immature Granulocytes  0.15 (*)    All other components within normal limits  BASIC METABOLIC PANEL - Abnormal; Notable for the following components:   Sodium 133 (*)    Chloride 96 (*)    Glucose, Bld 148 (*)    BUN 29 (*)    Creatinine, Ser 1.60 (*)    Calcium 8.5 (*)    GFR calc non Af Amer 28 (*)    GFR calc Af Amer 33 (*)    All other components within normal limits  SARS CORONAVIRUS 2 (TAT 6-24 HRS)  URINALYSIS, ROUTINE W REFLEX MICROSCOPIC  TYPE AND SCREEN    EKG None  Radiology DG Chest Port 1 View  Result Date: 10/10/2019 CLINICAL DATA:  This of breath EXAM: PORTABLE CHEST 1 VIEW COMPARISON:  Chest x-rays dated 10/05/2019 and 12/12/2014. FINDINGS: Stable cardiomegaly. Stable chronic scarring/fibrosis within the upper lobes with associated hilar retractions. No new lung findings. No pleural effusion or pneumothorax. Valvular hardware is stable in position. LEFT chest wall pacemaker/ICD hardware appears stable in position. No acute appearing osseous abnormality. Aortic atherosclerosis. IMPRESSION: 1. No active disease. No evidence of pneumonia or pulmonary edema. 2. Stable cardiomegaly. 3. Stable chronic appearing changes within the upper lobes, corresponding to previous chest CT reports describing mucoid impaction. 4. Aortic atherosclerosis. Electronically Signed   By: Franki Cabot M.D.   On: 10/10/2019 15:41    Procedures Procedures (including critical care time)  Medications Ordered in ED Medications  pantoprazole (PROTONIX) injection 40 mg (40 mg Intravenous Given 10/10/19 1535)    ED Course  I have reviewed the triage vital signs and the nursing notes.  Pertinent labs & imaging results that were available during my care of the patient were reviewed by me and considered in my medical decision making (see chart for details).    MDM Rules/Calculators/A&P                      86 year old female who presents to the ED today after being sent from urgent care for abnormal lab work,  hemoglobin of 6.8.  Began having melanotic stools as well as bright red blood per rectum for the past 2 days.  Currently on Eliquis.  Was started on prednisone earlier this week for a COPD exacerbation.  Suspect both are likely contributing.  Patient has a history of GI bleed in the past.  Was restarted on Eliquis last month after new stroke.  She has been having her hemoglobin checked every other week due to concern for repeat bleeding.  Arrival to the ED patient is afebrile, nontachycardic.  She appears to be in no acute distress.  She does have some mild tachypnea however lungs are clear to auscultation bilaterally.  Does mention that patient was also recently treated for UTI with amoxicillin however is concerned that she has been having more difficulty voiding.  Reports that patient will sit on the toilet for 40 minutes at a time but only void small amounts.  We will repeat lab work today including a CBC, BMP.- type and Screen initiated with concern for need for blood transfusion.  She is declining repeat GU exam, she was fecal occult positive at urgent care today.  Feel this is reasonable.  She has no abdominal tenderness on exam today.  Do not feel she needs CT scan at  this time.  CBC with a hemoglobin of 7.6 which is certainly improved from urgent care however suspect some discrepancy with different labs.  Given some improvement in her hemoglobin will hold off on emergent blood transfusion at this time.  Regardless given symptoms and GI bleed with patient's age and on anticoagulation feel she will benefit from admission at this time.  Gust case with attending physician Dr. Laverta Baltimore who agrees with plan, may need to have CBC rechecked in the morning prior to blood transfusion.  Will discuss this with hospitalist. Protonix ordered.   Hemoglobin  Date Value Ref Range Status  10/10/2019 7.6 (L) 12.0 - 15.0 g/dL Final  09/29/2019 9.0 (L) 11.1 - 15.9 g/dL Final  09/15/2019 9.4 (L) 11.1 - 15.9 g/dL Final    09/08/2019 9.9 (L) 11.1 - 15.9 g/dL Final  09/02/2019 10.2 (L) 11.1 - 15.9 g/dL Final    BMP with glucose 148 and sodium 133. Creatinine 1.60 which appears to be roughly around pt's baseline.   Lab Results  Component Value Date   CREATININE 1.60 (H) 10/10/2019   CREATININE 1.32 (H) 09/29/2019   CREATININE 1.61 (H) 09/02/2019   CXR clear Awaiting bladder scan and U/A at this time. Pt currently in the restroom attempting to void.   Discussed case with Hospitalist Dr. Dyann Kief who agrees to evaluate patient for admission. Recommends consulting GI and transfusing 1 unit PRBCs in the ED.  Discussed case with Dr. Oneida Alar GI who will follow  This note was prepared using Dragon voice recognition software and may include unintentional dictation errors due to the inherent limitations of voice recognition software.    Final Clinical Impression(s) / ED Diagnoses Final diagnoses:  Symptomatic anemia  Gastrointestinal hemorrhage, unspecified gastrointestinal hemorrhage type  Long term (current) use of anticoagulants    Rx / DC Orders ED Discharge Orders    None       Eustaquio Maize, PA-C 10/10/19 1639    Long, Wonda Old[facility], MD 10/11/19 [phone]

## 2019-10-10 NOTE — ED Triage Notes (Signed)
Patient sent to hospital by PCP due to abnormal lab. Patient states she was told she needed a blood transfusion. Patient unsure of the results (hbg/hct). Last one noted in chart was on 4/6 at hgb 9, hct 27.8.

## 2019-10-10 NOTE — Progress Notes (Signed)
Pleasant older women in for anemia related to gi bleed? Plus  Kidney failure. With history of asthma. Started on everyday Breo. Inhaler. Patient appears clear diminished with no wheezes present. X-ray showing for most part clear nothing new. Will make albuterol and atrovent  Neb prn , as patient states she only uses albuterol as needed mainly when asthma flares up. At this time we are still waiting for Covid test to return. It is doubtful she has Covid ???

## 2019-10-10 NOTE — H&P (Signed)
TRH H&P   Patient Demographics:    Crystal Cooper, is a 86 y.o. female  MRN: [id number]   DOB - [birth year]  Admit Date - 10/10/2019  Outpatient Primary MD for the patient is [name], MD  Referring MD/NP/PA: PA Margaux.  Patient coming from: Home  Chief Complaint  Patient presents with  . Abnormal Lab      HPI:    Crystal Cooper  is a 86 y.o. female, medical history of chronic diastolic CHF on Lasix, hypertension, paroxysmal atrial fibrillation on Eliquis, recent multifocal acute CVA can [name] to A. fib last March, where she was resumed on Eliquis, patient presents to ED secondary to melena, apparently patient had some dyspnea, and confusion earlier in the week, she did follow with urgent care last Tuesday diagnosed with UTI and bronchitis, this has totally improved after taking amoxicillin and steroids, over last 48 hours she did develop melena and bright red blood per rectum, she did follow with urgent care this morning, her hemoglobin was noted to be 6.8, and she was Hemoccult positive, so she was transferred to ED for further evaluation, patient denies any chest pain, fever, chills, coffee-ground emesis, nausea or vomiting, but he does report generalized weakness and fatigue, she reports already for bowel movements with melena and bright red blood per rectum over last 24 hours, with known history of previous GI bleed with her anticoagulation has been stopped, but she was resumed on Eliquis last month given acute CVA, and decision has been made to continue with full anticoagulation, and to deal with GI bleed as it happens. - in ED globin was noted to be 7.6, she received 1 unit PRBC transfusion, her creatinine was 1.6, baseline around 1.2, well MCV was noted to be 102, [facility] hospitalist was consulted to admit.    Review of systems:    In addition to the HPI above,  No  Fever-chills, No Headache, No changes with Vision or hearing, No problems swallowing food or Liquids, No Chest pain, he had cough and shortness of breath earlier in the week, but resolved after steroids and antibiotics. No Abdominal pain, No Nausea or Vommitting, Bowel movements are regular, She does report melena and bright red blood per rectum. No dysuria, No new skin rashes or bruises, No new joints pains-aches,  No new weakness, tingling, numbness in any extremity, No recent weight gain or loss, No polyuria, polydypsia or polyphagia, No significant Mental Stressors.  A full 10 point Review of Systems was done, except as stated above, all other Review of Systems were negative.   With Past History of the following :    Past Medical History:  Diagnosis Date  . Anemia    years ago  . Aortic stenosis   . Arthritis   . Asthma   . Atrial fibrillation ([place])   . CHF (congestive heart failure) ([place]) 11/2014  . Family history  of adverse reaction to anesthesia    2 daughters would have N/V  . Glaucoma   . Hearing loss   . Heart murmur   . HTN (hypertension)   . Pneumonia   . Prolapsing mitral leaflet syndrome   . Shortness of breath dyspnea    with exertion  . SVT (supraventricular tachycardia) (HCC)    S/P ablation of AVNRT in 2003      Past Surgical History:  Procedure Laterality Date  . APPENDECTOMY    . BIOPSY  04/07/2018   Procedure: BIOPSY;  Surgeon: Jerene Bears, MD;  Location: [facility] ENDOSCOPY;  Service: Gastroenterology;;  . BLADDER SURGERY    . CARDIAC CATHETERIZATION N/A 09/26/2015   Procedure: Right/Left Heart Cath and Coronary Angiography;  Surgeon: Burnell Blanks, MD;  Location: [place] CV LAB;  Service: Cardiovascular;  Laterality: N/A;  . CARDIAC SURGERY    . CARDIOVERSION N/A 03/02/2016   Procedure: CARDIOVERSION;  Surgeon: Thayer Headings, MD;  Location: [place];  Service: Cardiovascular;  Laterality: N/A;  . EP IMPLANTABLE DEVICE N/A 10/26/2015     Procedure: Pacemaker Implant;  Surgeon: Will Meredith Leeds, MD;  Location: [place] CV LAB;  Service: Cardiovascular;  Laterality: N/A;  . ESOPHAGOGASTRODUODENOSCOPY (EGD) WITH PROPOFOL N/A 04/07/2018   Procedure: ESOPHAGOGASTRODUODENOSCOPY (EGD) WITH PROPOFOL;  Surgeon: Jerene Bears, MD;  Location[facility] ENDOSCOPY;  Service: Gastroenterology;  Laterality: N/A;  . EYE SURGERY Bilateral    cataract surgery  . HOT HEMOSTASIS N/A 04/07/2018   Procedure: HOT HEMOSTASIS (ARGON PLASMA COAGULATION/BICAP);  Surgeon: Jerene Bears, MD;  Location: [facility] ENDOSCOPY;  Service: Gastroenterology;  Laterality: N/A;  . NASAL SINUS SURGERY    . TEE WITHOUT CARDIOVERSION N/A 10/25/2015   Procedure: TRANSESOPHAGEAL ECHOCARDIOGRAM (TEE);  Surgeon: Burnell Blanks, MD;  Location: [place];  Service: Open Heart Surgery;  Laterality: N/A;  . TRANSCATHETER AORTIC VALVE REPLACEMENT, TRANSFEMORAL Right 10/25/2015   Procedure: TRANSCATHETER AORTIC VALVE REPLACEMENT, TRANSFEMORAL;  Surgeon: Burnell Blanks, MD;  Location: [place];  Service: Open Heart Surgery;  Laterality: Right;      Social History:     Social History   Tobacco Use  . Smoking status: Never Smoker  . Smokeless tobacco: Never Used  Substance Use Topics  . Alcohol use: No    Alcohol/week: 0.0 standard drinks     Lives -she lives at home with 24/7 care    Family History :     Family History  Problem Relation Age of Onset  . Hypertension Mother   . Pneumonia Father   . Heart attack Neg Hx   . Colon cancer Neg Hx   . Esophageal cancer Neg Hx       Home Medications:   Prior to Admission medications   Medication Sig Start Date End Date Taking? Authorizing Provider  acetaminophen (TYLENOL) 500 MG tablet Take 500 mg by mouth every 6 (six) hours as needed for mild pain or headache.    Yes [provider]  albuterol (PROAIR HFA) 108 (90 Base) MCG/ACT inhaler Inhale 2 puffs into the lungs every 4 (four) hours as needed for  wheezing or shortness of breath. 08/05/19  Yes Rakes, Connye Burkitt, FNP  amoxicillin (AMOXIL) 500 MG capsule Take 1 capsule (500 mg total) by mouth 3 (three) times daily. 10/06/19  Yes Stacks, [name], MD  apixaban (ELIQUIS) 2.5 MG TABS tablet Take 1 tablet (2.5 mg total) by mouth 2 (two) times daily. Patient taking differently: Take 2.5 mg by mouth daily.  1/2 tab am, 1/2 tab pm 08/30/19  Yes Amin, Ankit Chirag, MD  Biotin 10 MG CAPS Take 10 mg by mouth daily.    Yes [provider]  Calcium Carb-Cholecalciferol (CALTRATE 600+D3) 600-800 MG-UNIT TABS Take 1 tablet by mouth daily.   Yes [provider]  docusate sodium (COLACE) 100 MG capsule Take 100 mg by mouth daily as needed for mild constipation.   Yes [provider]  enalapril (VASOTEC) 2.5 MG tablet Take 2.5 mg by mouth daily.   Yes [provider]  ferrous sulfate 325 (65 FE) MG tablet Take 1 tablet (325 mg total) by mouth 2 (two) times daily with a meal. 04/08/18  Yes Vann, Jessica U, DO  fluticasone furoate-vilanterol (BREO ELLIPTA) 100-25 MCG/INH AEPB Inhale 1 puff into the lungs daily. 09/23/19  Yes Rakes, Connye Burkitt, FNP  furosemide (LASIX) 40 MG tablet Take 1.5 tablets (60 mg total) by mouth daily. 03/24/19  Yes Rakes, Connye Burkitt, FNP  metoprolol tartrate (LOPRESSOR) 25 MG tablet Take 1 tablet (25 mg total) by mouth 2 (two) times daily. 05/20/19  Yes Camnitz, Will Hassell Done, MD  multivitamin-iron-minerals-folic acid (CENTRUM) chewable tablet Chew 1 tablet by mouth daily.   Yes [provider]  pantoprazole (PROTONIX) 40 MG tablet Take 1 tablet (40 mg total) by mouth 2 (two) times daily before a meal. 08/30/19 10/29/19 Yes Amin, Ankit Chirag, MD  predniSONE (DELTASONE) 10 MG tablet See admin instructions. Take 4 po qd x 2d then 3 po qd x 2d then 2 po qd x 2d then 1 po qd x 2d then stop 10/05/19  Yes [provider]     Allergies:     Allergies  Allergen Reactions  . Hctz [Hydrochlorothiazide] Other (See  Comments)    Pt was ill and this affected her kidneys   . Aspirin Other (See Comments)    Cardiologist said the patient is to not take this  . Codeine Other (See Comments)    Made the patient feel ill, has not had any problems since 1977     Physical Exam:   Vitals  Blood pressure 118/61, pulse 61, temperature 97.7 F (36.5 C), temperature source Oral, resp. rate 16, height 5\' 2"  (1.575 m), weight 51.3 kg, SpO2 100 %.   1. General Pleasant elderly female, lying in bed in NAD,    2. Normal affect and insight, Not Suicidal or Homicidal, Awake Alert, Oriented X 3.  3. No F.N deficits, ALL C.Nerves Intact, Strength 5/5 all 4 extremities, Sensation intact all 4 extremities, Plantars down going.  4. Ears and Eyes appear Normal, Conjunctivae clear, PERRLA. Moist Oral Mucosa.  5. Supple Neck, No JVD, No cervical lymphadenopathy appriciated, No Carotid Bruits.  6. Symmetrical Chest wall movement, Good air movement bilaterally, bedrails.  7. RRR, No Gallops, Rubs or Murmurs, No Parasternal Heave, +1 edema bilaterally  8. Positive Bowel Sounds, Abdomen Soft, No tenderness, No organomegaly appriciated,No rebound -guarding or rigidity.  9.  No Cyanosis, Normal Skin Turgor, No Skin Rash or Bruise.  10. Good muscle tone,  joints appear normal , no effusions, Normal ROM.  11. No Palpable Lymph Nodes in Neck or Axillae    Data Review:    CBC Recent Labs  Lab 10/10/19 1438  WBC 7.7  HGB 7.6*  HCT 25.1*  PLT 297  MCV 102.0*  MCH 30.9  MCHC 30.3  RDW 13.4  LYMPHSABS 0.6*  MONOABS 0.2  EOSABS 0.0  BASOSABS 0.0   ------------------------------------------------------------------------------------------------------------------  Chemistries  Recent Labs  Lab 10/10/19 1438  NA 133*  K 3.7  CL 96*  CO2 28  GLUCOSE 148*  BUN 29*  CREATININE 1.60*  CALCIUM 8.5*    ------------------------------------------------------------------------------------------------------------------ estimated creatinine clearance is 18.5 mL/min (A) (by C-G formula based on SCr of 1.6 mg/dL (H)). ------------------------------------------------------------------------------------------------------------------ No results for input(s): TSH, T4TOTAL, T3FREE, THYROIDAB in the last 72 hours.  Invalid input(s): FREET3  Coagulation profile No results for input(s): INR, PROTIME in the last 168 hours. ------------------------------------------------------------------------------------------------------------------- No results for input(s): DDIMER in the last 72 hours. -------------------------------------------------------------------------------------------------------------------  Cardiac Enzymes No results for input(s): CKMB, TROPONINI, MYOGLOBIN in the last 168 hours.  Invalid input(s): CK ------------------------------------------------------------------------------------------------------------------    Component Value Date/Time   BNP 483.0 (H) 08/26/2019 1135     ---------------------------------------------------------------------------------------------------------------  Urinalysis    Component Value Date/Time   COLORURINE COLORLESS (A) 08/25/2019 1615   APPEARANCEUR CLEAR 08/25/2019 1615   APPEARANCEUR Clear 08/03/2019 1620   LABSPEC 1.005 08/25/2019 1615   PHURINE 8.0 08/25/2019 1615   GLUCOSEU NEGATIVE 08/25/2019 1615   HGBUR SMALL (A) 08/25/2019 1615   BILIRUBINUR NEGATIVE 08/25/2019 1615   BILIRUBINUR Negative 08/03/2019 [place] 08/25/2019 1615   PROTEINUR NEGATIVE 08/25/2019 1615   NITRITE NEGATIVE 08/25/2019 1615   LEUKOCYTESUR NEGATIVE 08/25/2019 1615    ----------------------------------------------------------------------------------------------------------------   Imaging Results:    DG Chest Port 1 View  Result  Date: 10/10/2019 CLINICAL DATA:  This of breath EXAM: PORTABLE CHEST 1 VIEW COMPARISON:  Chest x-rays dated 10/05/2019 and 12/12/2014. FINDINGS: Stable cardiomegaly. Stable chronic scarring/fibrosis within the upper lobes with associated hilar retractions. No new lung findings. No pleural effusion or pneumothorax. Valvular hardware is stable in position. LEFT chest wall pacemaker/ICD hardware appears stable in position. No acute appearing osseous abnormality. Aortic atherosclerosis. IMPRESSION: 1. No active disease. No evidence of pneumonia or pulmonary edema. 2. Stable cardiomegaly. 3. Stable chronic appearing changes within the upper lobes, corresponding to previous chest CT reports describing mucoid impaction. 4. Aortic atherosclerosis. Electronically Signed   By: Franki Cabot M.D.   On: 10/10/2019 15:41     Assessment & Plan:    Active Problems:   Essential hypertension   PAF (paroxysmal atrial fibrillation) (HCC)   Iron deficiency anemia   GI bleed   CKD (chronic kidney disease), stage III   Congestive heart failure (HCC)   GI bleed/acute blood loss anemia -Patient presents with edema and breath of blood per rectum x24 hours, had multiple episodes over last 24 hours, with significant drop in her hemoglobin. -We will request and transfuse 1 unit PRBC. -Start on Protonix drip. 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 -GI consulted regarding further recommendation, and when appropriate to resume Eliquis.  Meanwhile we will keep on clear liquid diet if there is any procedure anticipated. -Monitor H&H closely. -Patient MCV is 102, and anemia panel including T05 and folic acid.  History of recent acute multifocal CVA. -This was secondary to A. fib, she was started on Eliquis, it  is currently on hold.  History of paroxysmal atrial fibrillation/sick sinus syndrome status post pacemaker -Heart rate is controlled, continue with metoprolol, Eliquis on hold  Chronic diastolic CHF -She does have lower extremity edema, continue with home dose Lasix.  Hyperetension -Blood pressure is acceptable, hold ACE inhibitor given worsening  renal function, continue with metoprolol  AKI on CKD stage III -Creatinine worsened to 1.6, but hold enalapril  Recent diagnosis of UTI and bronchitis -Continue with home dose amoxicillin, will stop prednisone especially in setting of GI bleed(both meds  were started by urgent care last Tuesday)   DVT Prophylaxis :SCD, Eliquis on hold  AM Labs Ordered, also please review Full Orders  Family Communication: Admission, patients condition and plan of care including tests being ordered have been discussed with the patient and daughter who indicate understanding and agree with the plan and Code Status.  Code Status Full  Likely DC to  Home(has 24 hours care)  Condition GUARDED    Consults called: GI    Admission status: Inpatient    Time spent in minutes : 60 Minutes   Phillips Climes M.D on 10/10/2019 at 5:02 PM  Between 7am to 7pm - Pager - [phone]. After 7pm go to [email or web address] - password [facility]  [facility] - Office  [phone]

## 2019-10-11 DIAGNOSIS — I10 Essential (primary) hypertension: Secondary | ICD-10-CM

## 2019-10-11 DIAGNOSIS — N1832 Chronic kidney disease, stage 3b: Secondary | ICD-10-CM | POA: Diagnosis not present

## 2019-10-11 DIAGNOSIS — I480 Paroxysmal atrial fibrillation: Secondary | ICD-10-CM

## 2019-10-11 DIAGNOSIS — Z7901 Long term (current) use of anticoagulants: Secondary | ICD-10-CM

## 2019-10-11 DIAGNOSIS — N179 Acute kidney failure, unspecified: Secondary | ICD-10-CM

## 2019-10-11 DIAGNOSIS — K921 Melena: Secondary | ICD-10-CM | POA: Diagnosis not present

## 2019-10-11 DIAGNOSIS — I5032 Chronic diastolic (congestive) heart failure: Secondary | ICD-10-CM

## 2019-10-11 DIAGNOSIS — K922 Gastrointestinal hemorrhage, unspecified: Secondary | ICD-10-CM | POA: Diagnosis not present

## 2019-10-11 LAB — CBC
HCT: 25.8 % — ABNORMAL LOW (ref 36.0–46.0)
Hemoglobin: 8.3 g/dL — ABNORMAL LOW (ref 12.0–15.0)
MCH: 31.3 pg (ref 26.0–34.0)
MCHC: 32.2 g/dL (ref 30.0–36.0)
MCV: 97.4 fL (ref 80.0–100.0)
Platelets: 280 10*3/uL (ref 150–400)
RBC: 2.65 MIL/uL — ABNORMAL LOW (ref 3.87–5.11)
RDW: 14.3 % (ref 11.5–15.5)
WBC: 7.2 10*3/uL (ref 4.0–10.5)
nRBC: 0 % (ref 0.0–0.2)

## 2019-10-11 LAB — BASIC METABOLIC PANEL
Anion gap: 11 (ref 5–15)
BUN: 23 mg/dL (ref 8–23)
CO2: 30 mmol/L (ref 22–32)
Calcium: 8.6 mg/dL — ABNORMAL LOW (ref 8.9–10.3)
Chloride: 95 mmol/L — ABNORMAL LOW (ref 98–111)
Creatinine, Ser: 1.23 mg/dL — ABNORMAL HIGH (ref 0.44–1.00)
GFR calc Af Amer: 45 mL/min — ABNORMAL LOW (ref >60)
GFR calc non Af Amer: 39 mL/min — ABNORMAL LOW (ref >60)
Glucose, Bld: 85 mg/dL (ref 70–99)
Potassium: 3.2 mmol/L — ABNORMAL LOW (ref 3.5–5.1)
Sodium: 136 mmol/L (ref 135–145)

## 2019-10-11 LAB — TYPE AND SCREEN
ABO/RH(D): A POS
Antibody Screen: NEGATIVE
Unit division: 0

## 2019-10-11 LAB — FOLATE: Folate: 26.1 ng/mL (ref >5.9)

## 2019-10-11 LAB — BPAM RBC
Blood Product Expiration Date: 202105082359
ISSUE DATE / TIME: 202104171713
Unit Type and Rh: 6200

## 2019-10-11 LAB — SARS CORONAVIRUS 2 (TAT 6-24 HRS): SARS Coronavirus 2: NEGATIVE

## 2019-10-11 MED ORDER — POTASSIUM CHLORIDE CRYS ER 20 MEQ PO TBCR
40.0000 meq | EXTENDED_RELEASE_TABLET | ORAL | Status: AC
  Administered 2019-10-11 (×2): 40 meq via ORAL
  Filled 2019-10-11: qty 4
  Filled 2019-10-11: qty 2

## 2019-10-11 MED ORDER — AMOXICILLIN 250 MG PO CAPS
500.0000 mg | ORAL_CAPSULE | Freq: Two times a day (BID) | ORAL | Status: DC
  Administered 2019-10-11 – 2019-10-13 (×4): 500 mg via ORAL
  Filled 2019-10-11 (×4): qty 2

## 2019-10-11 MED ORDER — PANTOPRAZOLE SODIUM 40 MG IV SOLR
INTRAVENOUS | Status: AC
  Filled 2019-10-11: qty 80

## 2019-10-11 NOTE — Progress Notes (Signed)
PROGRESS NOTE    Crystal Cooper  XBD:[id number] DOB: [birth year] DOA: 10/10/2019 PCP: [name], MD    Chief Complaint  Patient presents with  . Abnormal Lab    Brief Narrative:  As per H&P written by Dr. Waldron Labs on 10/10/2019 86 y.o. female, medical history of chronic diastolic CHF on Lasix, hypertension, paroxysmal atrial fibrillation on Eliquis, recent multifocal acute CVA can [name] to A. fib last March, where she was resumed on Eliquis, patient presents to ED secondary to melena, apparently patient had some dyspnea, and confusion earlier in the week, she did follow with urgent care last Tuesday diagnosed with UTI and bronchitis, this has totally improved after taking amoxicillin and steroids, over last 48 hours she did develop melena and bright red blood per rectum, she did follow with urgent care this morning, her hemoglobin was noted to be 6.8, and she was Hemoccult positive, so she was transferred to ED for further evaluation, patient denies any chest pain, fever, chills, coffee-ground emesis, nausea or vomiting, but he does report generalized weakness and fatigue, she reports already for bowel movements with melena and bright red blood per rectum over last 24 hours, with known history of previous GI bleed with her anticoagulation has been stopped, but she was resumed on Eliquis last month given acute CVA, and decision has been made to continue with full anticoagulation, and to deal with GI bleed as it happens. - in ED globin was noted to be 7.6, she received 1 unit PRBC transfusion, her creatinine was 1.6, baseline around 1.2, well MCV was noted to be 102, [facility] hospitalist was consulted to admit.  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 -Continue holding anticoagulation therapy -Continue to follow hemoglobin trend -Continue IV PPI -Transfuse as needed -GI service has been  consulted and will follow further recommendations and clearance for anticoagulation resumption.  Essential hypertension -Stable and well-controlled -Continue metoprolol -Rest of medications will be held to assure hemodynamic stability and further  improvement/recovery of renal function.  Acute kidney injury and chronic kidney disease a stage IIIb In the setting of poor perfusion with anemia and continue use of nephrotoxic agents -Continue holding nephrotoxic agents; avoid hypotension. -Maintain adequate hydration -1 unit PRBCs have been provided -Hemoglobin stable currently. -Will follow renal function trend.  Hypokalemia -Will provide electrolytes repletion -Follow magnesium level -Follow electrolytes trend.  Paroxysmal atrial fibrillation -Rate control and is stable -Continue metoprolol -Holding anticoagulation in the setting of acute GI bleed.  Chronic diastolic heart failure -Overall compensated and denies shortness of breath. -Continue to follow daily weights, I's and O's and continue home dose of Lasix.  Recent diagnosis of UTI and bronchitis -Complete treatment of amoxicillin. -No fever, no dysuria, no coughing spells or shortness of breath currently.  History of recent focal CVA -Appears to be secondary to paroxysmal A. fib and cardioembolic event. -Plan is to resume Eliquis for secondary prevention clear by GI service. -Continue risk factor modifications.    DVT prophylaxis: SCDs Code Status: Full code Family Communication: Daughter at bedside Disposition:   Status is: Inpatient  Dispo: The patient is from: Home              Anticipated d/c is to: Home              Anticipated d/c date is: 10/13/19; unless drastically improved overnight and cleared by GI to go home on 10/12/2019 afternoon.  Patient currently improved after blood transfusion; no overt bleeding appreciated.  Hemoglobin up to 8.3.  Continue holding anticoagulation therapy and  following GI recommendations will proceed with EGD evaluation tomorrow (10/12/2019).        Consultants:   GI service   Procedures:  Planned EGD for 10/12/19   Antimicrobials:  Amoxicillin (2 more days pending).  Subjective: Afebrile, no chest pain, no nausea, no vomiting.  Feeling better after transfusion.  Reports per nursing staff denies any further overt bleeding.  Objective: Vitals:   10/11/19 0604 10/11/19 0718 10/11/19 1416 10/11/19 1622  BP: 132/68  (!) 100/49 (!) 136/59  Pulse: 63  65 60  Resp: 20  16   Temp: 98.6 F (37 C)  98.1 F (36.7 C)   TempSrc: Oral     SpO2: 97% 94% 98% 100%  Weight:      Height:        Intake/Output Summary (Last 24 hours) at 10/11/2019 1642 Last data filed at 10/11/2019 1553 Gross per 24 hour  Intake 651.49 ml  Output 3402 ml  Net -2750.51 ml   Filed Weights   10/10/19 1430  Weight: 51.3 kg    Examination:  General exam: Appears calm and comfortable; feeling weak and tired; no chest pain, no nausea vomiting. Respiratory system: Clear to auscultation. Respiratory effort normal. Cardiovascular system: Rate controlled, no JVD, rubs, gallops or clicks. No pedal edema. Gastrointestinal system: Abdomen is nondistended, soft and nontender. No organomegaly or masses felt. Normal bowel sounds heard. Central nervous system: Alert and oriented. No focal deficits. Extremities: Symmetric 4 x 5 power due to poor effort. Skin: No rashes, no petechiae. Psychiatry: Mood & affect appropriate.    Data Reviewed: I have personally reviewed following labs and imaging studies  CBC: Recent Labs  Lab 10/10/19 1438 10/11/19 0541  WBC 7.7 7.2  NEUTROABS 6.7  --   HGB 7.6* 8.3*  HCT 25.1* 25.8*  MCV 102.0* 97.4  PLT 297 294    Basic Metabolic Panel: Recent Labs  Lab 10/10/19 1438 10/11/19 0541  NA 133* 136  K 3.7 3.2*  CL 96* 95*  CO2 28 30  GLUCOSE 148* 85  BUN 29* 23  CREATININE 1.60* 1.23*  CALCIUM 8.5* 8.6*     GFR: Estimated Creatinine Clearance: 24 mL/min (A) (by C-G formula based on SCr of 1.23 mg/dL (H)).   Recent Results (from the past 240 hour(s))  SARS CORONAVIRUS 2 (TAT 6-24 HRS) Nasopharyngeal Nasopharyngeal Swab     Status: None   Collection Time: 10/10/19  3:38 PM   Specimen: Nasopharyngeal Swab  Result Value Ref Range Status   SARS Coronavirus 2 NEGATIVE NEGATIVE Final    Comment: (NOTE) SARS-CoV-2 target nucleic acids are NOT DETECTED. The SARS-CoV-2 RNA is generally detectable in upper and lower respiratory specimens during the acute phase of infection. Negative results do not preclude SARS-CoV-2 infection, do not rule out co-infections with other pathogens, and should not be used as the sole basis for treatment or other patient management decisions. Negative results must be combined with clinical observations, patient history, and epidemiological information. The expected result is Negative. Fact Sheet for Patients: SugarRoll.be Fact Sheet for Healthcare Providers: https://www.woods-mathews.com/ This test is not yet approved or cleared by the [place] FDA and  has been authorized for detection and/or diagnosis of SARS-CoV-2 by FDA under an Emergency Use Authorization (EUA). This EUA will remain  in effect (meaning this test can be used) for the duration of the COVID-19 declaration under  Section 56 4(b)(1) of the Act, 21 U.S.C. section 360bbb-3(b)(1), unless the authorization is terminated or revoked sooner. Performed at [facility], [place] [address] [place], [place] [address]      Radiology Studies: DG Chest Port 1 View  Result Date: 10/10/2019 CLINICAL DATA:  This of breath EXAM: PORTABLE CHEST 1 VIEW COMPARISON:  Chest x-rays dated 10/05/2019 and 12/12/2014. FINDINGS: Stable cardiomegaly. Stable chronic scarring/fibrosis within the upper lobes with associated hilar retractions. No new lung findings. No pleural  effusion or pneumothorax. Valvular hardware is stable in position. LEFT chest wall pacemaker/ICD hardware appears stable in position. No acute appearing osseous abnormality. Aortic atherosclerosis. IMPRESSION: 1. No active disease. No evidence of pneumonia or pulmonary edema. 2. Stable cardiomegaly. 3. Stable chronic appearing changes within the upper lobes, corresponding to previous chest CT reports describing mucoid impaction. 4. Aortic atherosclerosis. Electronically Signed   By: Franki Cabot M.D.   On: 10/10/2019 15:41    Scheduled Meds: . amoxicillin  500 mg Oral Q12H  . calcium-vitamin D   Oral Daily  . fluticasone furoate-vilanterol  1 puff Inhalation Daily  . furosemide  60 mg Oral Daily  . metoprolol tartrate  25 mg Oral BID  . multivitamin with minerals  1 tablet Oral Daily  . [START ON 10/14/2019] pantoprazole  40 mg Intravenous Q12H   Continuous Infusions: . pantoprozole (PROTONIX) infusion 8 mg/hr (10/11/19 1553)     LOS: 1 day    Time spent: 30 minutes    Barton Dubois, MD [facility]   To contact the attending provider between 7A-7P or the covering provider during after hours 7P-7A, please log into the web site [email or web address] and access using universal [place] password for that web site. If you do not have the password, please call the hospital operator.  10/11/2019, 4:42 PM

## 2019-10-11 NOTE — Consult Note (Addendum)
Referring Provider: No ref. provider found Primary Care Physician:  [name], MD Primary Gastroenterologist:  DR. PYRTLE  Reason for Consultation:  MELENA/ANEMIA ON ELIQUIS   Impression: Admitted with MELENA/LOW BLOOD COUNT ON ELIQUIS. KNOWN HISTORY OF DUODENAL AVMs. REMOTE COLONOSCOPY BUT DEFERRED TO PT'S CO-MORBIDITIES. DIFFERENTIAL DIAGNOSIS INCLUDES:  AVMs, DIEULAFOY'S LESION, LESS LIKELY GE JUNCTION TUMOR, GASTRIC CA OR MESENTERIC ISCHEMIA.   Plan: 1. EGD W/ MAC ON APR 19.  DISCUSSED PROCEDURE, BENEFITS, & RISKS WITH DAUGHTER AND PT: < 1% chance of medication reaction, bleeding, perforation, or ASPIRATION. 2. PROTONIX GTT 3. DISCUSSED WITH DAUGHTER. PT SHOULD DISCUS BENEFITS V. RISKS OF COLONOSCOPY WITH DR. PYRTLE.    HPI:  PT HAS HISTORY OF GI BLEED DUE TO AVMs. LAST EGD/CAUTERY IN 2019. PT HAS BEEN ON AND OFF ELIQUIS DUE TO GI BLEED/ANEMIA SINCE 2019. LAST HOSPITAL STAY IN [facility] 2021 WAS FOR ACUTE CVA OFF ELIQUIS. COLONOSCOPY DEFERRED DUE TO CO-MORBIDITIES. ELIQUIS RE-STARTED.  PT SEEN LAST WEEK FOR SOB AND PLACED ON PREDNISONE/CIPRO AND THEN CHANGED TO AMOXICILLIN. BLACK TARRY STOOLS STARTED THUR: 2-3X, FRI: 2-3X. TAKE PROTONIX BID.  DENIES ASA/NSAIDs. Problems with sedation: NAUSEA. BMs: DAILY.  LAST ELIQUIS IN AM APR 17.  PT DENIES FEVER, CHILLS, HEMATOCHEZIA, HEMATEMESIS, vomiting, diarrhea, CHEST PAIN, CHANGE IN BOWEL IN HABITS, constipation, abdominal pain, problems swallowing, OR heartburn or indigestion.  Past Medical History:  Diagnosis Date  . Anemia    years ago  . Aortic stenosis   . Arthritis   . Asthma   . Atrial fibrillation ([place])   . CHF (congestive heart failure) ([place]) 11/2014  . Family history of adverse reaction to anesthesia    2 daughters would have N/V  . Glaucoma   . Hearing loss   . Heart murmur   . HTN (hypertension)   . Pneumonia   . Prolapsing mitral leaflet syndrome   . Shortness of breath dyspnea    with exertion  . SVT (supraventricular  tachycardia) ([facility])    S/P ablation of AVNRT in 2003    Past Surgical History:  Procedure Laterality Date  . APPENDECTOMY    . BIOPSY  04/07/2018   Procedure: BIOPSY;  Surgeon: Jerene Bears, MD;  Location: [facility] ENDOSCOPY;  Service: Gastroenterology;;  . BLADDER SURGERY    . CARDIAC CATHETERIZATION N/A 09/26/2015   Procedure: Right/Left Heart Cath and Coronary Angiography;  Surgeon: Burnell Blanks, MD;  Location: [place] CV LAB;  Service: Cardiovascular;  Laterality: N/A;  . CARDIAC SURGERY    . CARDIOVERSION N/A 03/02/2016   Procedure: CARDIOVERSION;  Surgeon: Thayer Headings, MD;  Location: [place];  Service: Cardiovascular;  Laterality: N/A;  . EP IMPLANTABLE DEVICE N/A 10/26/2015   Procedure: Pacemaker Implant;  Surgeon: Will Meredith Leeds, MD;  Location: [place] CV LAB;  Service: Cardiovascular;  Laterality: N/A;  . ESOPHAGOGASTRODUODENOSCOPY (EGD) WITH PROPOFOL N/A 04/07/2018   Procedure: ESOPHAGOGASTRODUODENOSCOPY (EGD) WITH PROPOFOL;  Surgeon: Jerene Bears, MD;  Location: [facility] ENDOSCOPY;  Service: Gastroenterology;  Laterality: N/A;  . EYE SURGERY Bilateral    cataract surgery  . HOT HEMOSTASIS N/A 04/07/2018   Procedure: HOT HEMOSTASIS (ARGON PLASMA COAGULATION/BICAP);  Surgeon: Jerene Bears, MD;  Location: [facility] ENDOSCOPY;  Service: Gastroenterology;  Laterality: N/A;  . NASAL SINUS SURGERY    . TEE WITHOUT CARDIOVERSION N/A 10/25/2015   Procedure: TRANSESOPHAGEAL ECHOCARDIOGRAM (TEE);  Surgeon: Burnell Blanks, MD;  Location: [place];  Service: Open Heart Surgery;  Laterality: N/A;  . TRANSCATHETER AORTIC VALVE REPLACEMENT, TRANSFEMORAL  Right 10/25/2015   Procedure: TRANSCATHETER AORTIC VALVE REPLACEMENT, TRANSFEMORAL;  Surgeon: Burnell Blanks, MD;  Location: [place];  Service: Open Heart Surgery;  Laterality: Right;    Prior to Admission medications   Medication Sig Start Date End Date Taking? Authorizing Provider  acetaminophen (TYLENOL) 500 MG tablet  Take 500 mg by mouth every 6 (six) hours as needed for mild pain or headache.    Yes [provider]  albuterol (PROAIR HFA) 108 (90 Base) MCG/ACT inhaler Inhale 2 puffs into the lungs every 4 (four) hours as needed for wheezing or shortness of breath. 08/05/19  Yes Rakes, Connye Burkitt, FNP  amoxicillin (AMOXIL) 500 MG capsule Take 1 capsule (500 mg total) by mouth 3 (three) times daily. 10/06/19  Yes Stacks, [name], MD  apixaban (ELIQUIS) 2.5 MG TABS tablet Take 1 tablet (2.5 mg total) by mouth 2 (two) times daily. Patient taking differently: Take 2.5 mg by mouth daily. 1/2 tab am, 1/2 tab pm 08/30/19  Yes Amin, Ankit Chirag, MD  Biotin 10 MG CAPS Take 10 mg by mouth daily.    Yes [provider]  Calcium Carb-Cholecalciferol (CALTRATE 600+D3) 600-800 MG-UNIT TABS Take 1 tablet by mouth daily.   Yes [provider]  docusate sodium (COLACE) 100 MG capsule Take 100 mg by mouth daily as needed for mild constipation.   Yes [provider]  enalapril (VASOTEC) 2.5 MG tablet Take 2.5 mg by mouth daily.   Yes [provider]  ferrous sulfate 325 (65 FE) MG tablet Take 1 tablet (325 mg total) by mouth 2 (two) times daily with a meal. 04/08/18  Yes Vann, Jessica U, DO  fluticasone furoate-vilanterol (BREO ELLIPTA) 100-25 MCG/INH AEPB Inhale 1 puff into the lungs daily. 09/23/19  Yes Rakes, Connye Burkitt, FNP  furosemide (LASIX) 40 MG tablet Take 1.5 tablets (60 mg total) by mouth daily. 03/24/19  Yes Rakes, Connye Burkitt, FNP  metoprolol tartrate (LOPRESSOR) 25 MG tablet Take 1 tablet (25 mg total) by mouth 2 (two) times daily. 05/20/19  Yes Camnitz, Will Hassell Done, MD  multivitamin-iron-minerals-folic acid (CENTRUM) chewable tablet Chew 1 tablet by mouth daily.   Yes [provider]  pantoprazole (PROTONIX) 40 MG tablet Take 1 tablet (40 mg total) by mouth 2 (two) times daily before a meal. 08/30/19 10/29/19 Yes Amin, Ankit Chirag, MD  predniSONE (DELTASONE) 10 MG tablet See admin  instructions. Take 4 po qd x 2d then 3 po qd x 2d then 2 po qd x 2d then 1 po qd x 2d then stop 10/05/19  Yes [provider]    Current Facility-Administered Medications  Medication Dose Route Frequency Provider Last Rate Last Admin  . acetaminophen (TYLENOL) tablet 500 mg  500 mg Oral Q6H PRN Elgergawy, Silver Huguenin, MD      . amoxicillin (AMOXIL) capsule 500 mg  500 mg Oral TID Elgergawy, Silver Huguenin, MD   500 mg at 10/10/19 2148  . calcium-vitamin D (OSCAL WITH D) 500-200 MG-UNIT per tablet   Oral Daily Elgergawy, Silver Huguenin, MD   1 tablet at 10/10/19 2148  . fluticasone furoate-vilanterol (BREO ELLIPTA) 100-25 MCG/INH 1 puff  1 puff Inhalation Daily Elgergawy, Silver Huguenin, MD   1 puff at 10/11/19 0718  . furosemide (LASIX) tablet 60 mg  60 mg Oral Daily Elgergawy, Silver Huguenin, MD   60 mg at 10/10/19 2148  . ipratropium-albuterol (DUONEB) 0.5-2.5 (3) MG/3ML nebulizer solution 3 mL  3 mL Nebulization Q6H PRN Elgergawy, Silver Huguenin, MD      .  metoprolol tartrate (LOPRESSOR) tablet 25 mg  25 mg Oral BID Elgergawy, Silver Huguenin, MD   25 mg at 10/10/19 2148  . multivitamin with minerals tablet 1 tablet  1 tablet Oral Daily Elgergawy, Silver Huguenin, MD      . pantoprazole (PROTONIX) 80 mg in sodium chloride 0.9 % 100 mL (0.8 mg/mL) infusion  8 mg/hr Intravenous Continuous Elgergawy, Silver Huguenin, MD 10 mL/hr at 10/11/19 0617 8 mg/hr at 10/11/19 0617  . [START ON 10/14/2019] pantoprazole (PROTONIX) injection 40 mg  40 mg Intravenous Q12H Elgergawy, Silver Huguenin, MD        Allergies as of 10/10/2019 - Review Complete 10/10/2019  Allergen Reaction Noted  . Hctz [hydrochlorothiazide] Other (See Comments)   . Aspirin Other (See Comments) 08/23/2019  . Codeine Other (See Comments) 01/11/2016    Family History  Problem Relation Age of Onset  . Hypertension Mother   . Pneumonia Father   . Heart attack Neg Hx   . Colon cancer Neg Hx   . Esophageal cancer Neg Hx      Social History   Socioeconomic History  . Marital  status: Widowed    Spouse name: Not on file  . Number of children: 3  . Years of education: Not on file  . Highest education level: 12th grade  Occupational History  . Occupation: Retired  Tobacco Use  . Smoking status: Never Smoker  . Smokeless tobacco: Never Used  Substance and Sexual Activity  . Alcohol use: No    Alcohol/week: 0.0 standard drinks  . Drug use: No  . Sexual activity: Not Currently  Other Topics Concern  . Not on file  Social History Narrative  . Not on file   Social Determinants of Health   Financial Resource Strain: Low Risk   . Difficulty of Paying Living Expenses: Not hard at all  Food Insecurity: No Food Insecurity  . Worried About Charity fundraiser in the Last Year: Never true  . Ran Out of Food in the Last Year: Never true  Transportation Needs: No Transportation Needs  . Lack of Transportation (Medical): No  . Lack of Transportation (Non-Medical): No  Physical Activity: Inactive  . Days of Exercise per Week: 0 days  . Minutes of Exercise per Session: 0 min  Stress: No Stress Concern Present  . Feeling of Stress : Not at all  Social Connections: Slightly Isolated  . Frequency of Communication with Friends and Family: More than three times a week  . Frequency of Social Gatherings with Friends and Family: More than three times a week  . Attends Religious Services: More than 4 times per year  . Active Member of Clubs or Organizations: Yes  . Attends Archivist Meetings: More than 4 times per year  . Marital Status: Widowed  Intimate Partner Violence: Not At Risk  . Fear of Current or Ex-Partner: No  . Emotionally Abused: No  . Physically Abused: No  . Sexually Abused: No    Review of Systems: PER HPI OTHERWISE ALL SYSTEMS ARE NEGATIVE.   Vitals: Blood pressure 132/68, pulse 63, temperature 98.6 F (37 C), temperature source Oral, resp. rate 20, height _0  (1.575 m), weight 51.3 kg, SpO2 94 %.  Physical Exam: General:    Alert,  Well-developed, well-nourished, pleasant and cooperative in NAD Head:  Normocephalic and atraumatic. Eyes:  Sclera clear, no icterus.    Mouth:  No lesions, dentition abnormal. Neck:  Supple; no masses. Lungs:  Clear throughout to  auscultation.   No wheezes. No acute distress. Heart:  Regular rate and rhythm; no murmurs. Abdomen:  Soft, nontender and nondistended. No masses noted. Normal bowel sounds, without guarding, and without rebound.   Msk:  Symmetrical without gross deformities. Normal posture. Extremities:  Without edema. Neurologic:  Alert and  oriented x4;  NO  NEW FOCAL DEFICITS, HARD OF HEARING Cervical Nodes:  No significant cervical adenopathy. Psych:  Alert and cooperative. Normal mood and affect.  Lab Results: Recent Labs    10/10/19 1438 10/11/19 0541  WBC 7.7 7.2  HGB 7.6* 8.3*  HCT 25.1* 25.8*  PLT 297 280   BMET Recent Labs    10/10/19 1438 10/11/19 0541  NA 133* 136  K 3.7 3.2*  CL 96* 95*  CO2 28 30  GLUCOSE 148* 85  BUN 29* 23  CREATININE 1.60* 1.23*  CALCIUM 8.5* 8.6*   LFT No results for input(s): PROT, ALBUMIN, AST, ALT, ALKPHOS, BILITOT, BILIDIR, IBILI in the last 72 hours.   Studies/Results: pCXR APR 17: NACPD, CARDIOMEGALY   LOS: 1 day   Crystal Cooper  10/11/2019, 8:11 AM

## 2019-10-11 NOTE — TOC Initial Note (Signed)
Transition of Care [facility]) - Initial/Assessment Note    Patient Details  Name: Crystal Cooper MRN: [id number] Date of Birth: 06-Dec-1926  [facility] CM/SW Contact:    Shade Flood, LCSW Phone Number: 10/11/2019, 11:56 AM  Clinical Narrative:                  Pt admitted from home. She is high risk for readmission. Pt lives alone in her own home. She has two daughters who live within 6 miles. Pt is active with [facility] RN and PT. She is established with a PCP and is followed by [facility] LCSW. Anticipating pt will return home with resumption of HH at dc.  TOC will follow and assist as needed.  Expected Discharge Plan: [place] Barriers to Discharge: Continued Medical Work up   Patient Goals and CMS Choice        Expected Discharge Plan and Services Expected Discharge Plan: [place] In-house Referral: Clinical Social Work   Post Acute Care Choice: Resumption of Svcs/PTA Provider Living arrangements for the past 2 months: Single Family Home                                      Prior Living Arrangements/Services Living arrangements for the past 2 months: Single Family Home Lives with:: Self Patient language and need for interpreter reviewed:: Yes Do you feel safe going back to the place where you live?: Yes      Need for Family Participation in Patient Care: Yes (Comment) Care giver support system in place?: Yes (comment) Current home services: DME, Home PT, Home RN Criminal Activity/Legal Involvement Pertinent to Current Situation/Hospitalization: No - Comment as needed  Activities of Daily Living Home Assistive Devices/Equipment: Cane (specify quad or straight), Shower chair without back, Walker (specify type) ADL Screening (condition at time of admission) Patient's cognitive ability adequate to safely complete daily activities?: Yes Is the patient deaf or have difficulty hearing?: Yes Does the patient have  difficulty seeing, even when wearing glasses/contacts?: No Does the patient have difficulty concentrating, remembering, or making decisions?: No Patient able to express need for assistance with ADLs?: Yes Does the patient have difficulty dressing or bathing?: Yes Independently performs ADLs?: No Communication: Independent Dressing (OT): Needs assistance Is this a change from baseline?: Pre-admission baseline Grooming: Needs assistance Is this a change from baseline?: Pre-admission baseline Feeding: Needs assistance Bathing: Needs assistance Toileting: Needs assistance In/Out Bed: Needs assistance Is this a change from baseline?: Pre-admission baseline Walks in Home: Independent with device (comment) Does the patient have difficulty walking or climbing stairs?: Yes Weakness of Legs: Both Weakness of Arms/Hands: Both  Permission Sought/Granted                  Emotional Assessment       Orientation: : Oriented to Self, Oriented to Place, Oriented to  Time, Oriented to Situation Alcohol / Substance Use: Not Applicable Psych Involvement: No (comment)  Admission diagnosis:  Long term (current) use of anticoagulants [Z79.01] GI bleed [K92.2] Symptomatic anemia [D64.9] Gastrointestinal hemorrhage, unspecified gastrointestinal hemorrhage type [K92.2] Patient Active Problem List   Diagnosis Date Noted  . Wheezing 09/23/2019  . Abnormal breath sounds 09/09/2019  . Heme positive stool   . Acute CVA (cerebrovascular accident) ([place]) 08/27/2019  . PNA (pneumonia) 08/25/2019  . Hypokalemia 08/23/2019  . Angio-edema, initial encounter 08/23/2019  .  Reactive airway disease 03/24/2019  . GERD without esophagitis 03/24/2019  . CKD (chronic kidney disease), stage III 11/12/2018  . GI bleed 05/12/2018  . 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 Diastolic dysfunction 04/79/9872  . Pacemaker 05/12/2018  . Iron deficiency anemia   . Melena   . Duodenal  arteriovenous malformation   . Gastritis and gastroduodenitis   . Normocytic anemia 04/06/2018  . Osteoporosis 08/27/2016  . ASCVD (arteriosclerotic cardiovascular disease) 01/11/2016  . Asthma 01/11/2016  . Glaucoma 01/11/2016  . Hearing loss 01/11/2016  . Other nonrheumatic mitral valve disorders 01/11/2016  . Junctional bradycardia   . PAF (paroxysmal atrial fibrillation) ([place]) 09/06/2015  . Hyponatremia 12/17/2014  . Congestive heart failure ([place]) 12/12/2014  . Murmur 10/19/2011  . Chest pain 10/19/2011  . Essential hypertension 10/19/2011  . History of cardiac radiofrequency ablation (RFA) 10/19/2011   PCP:  [name], MD Pharmacy:   [facility] [address] [place] [address] Phone: [phone] Fax: [phone]     Social Determinants of Health (SDOH) Interventions    Readmission Risk Interventions Readmission Risk Prevention Plan 10/11/2019  Transportation Screening Complete  HRI or Home Care Consult Complete  Palliative Care Screening Not Applicable  Medication Review (RN Care Manager) Complete  Some recent data might be hidden

## 2019-10-11 NOTE — Plan of Care (Signed)
  Problem: Education: Goal: Knowledge of General Education information will improve Description Including pain rating scale, medication(s)/side effects and non-pharmacologic comfort measures Outcome: Progressing   Problem: Health Behavior/Discharge Planning: Goal: Ability to manage health-related needs will improve Outcome: Progressing   

## 2019-10-11 NOTE — H&P (View-Only) (Signed)
Referring Provider: No ref. provider found Primary Care Physician:  Stacks, Warren, MD Primary Gastroenterologist:  DR. PYRTLE  Reason for Consultation:  MELENA/ANEMIA ON ELIQUIS   Impression: Admitted with MELENA/LOW BLOOD COUNT ON ELIQUIS. KNOWN HISTORY OF DUODENAL AVMs. REMOTE COLONOSCOPY BUT DEFERRED TO PT'S CO-MORBIDITIES. DIFFERENTIAL DIAGNOSIS INCLUDES:  AVMs, DIEULAFOY'S LESION, LESS LIKELY GE JUNCTION TUMOR, GASTRIC CA OR MESENTERIC ISCHEMIA.   Plan: 1. EGD W/ MAC ON APR 19.  DISCUSSED PROCEDURE, BENEFITS, & RISKS WITH DAUGHTER AND PT: < 1% chance of medication reaction, bleeding, perforation, or ASPIRATION. 2. PROTONIX GTT 3. DISCUSSED WITH DAUGHTER. PT SHOULD DISCUS BENEFITS V. RISKS OF COLONOSCOPY WITH DR. PYRTLE.    HPI:  PT HAS HISTORY OF GI BLEED DUE TO AVMs. LAST EGD/CAUTERY IN 2019. PT HAS BEEN ON AND OFF ELIQUIS DUE TO GI BLEED/ANEMIA SINCE 2019. LAST HOSPITAL STAY IN MAR 2021 WAS FOR ACUTE CVA OFF ELIQUIS. COLONOSCOPY DEFERRED DUE TO CO-MORBIDITIES. ELIQUIS RE-STARTED.  PT SEEN LAST WEEK FOR SOB AND PLACED ON PREDNISONE/CIPRO AND THEN CHANGED TO AMOXICILLIN. BLACK TARRY STOOLS STARTED THUR: 2-3X, FRI: 2-3X. TAKE PROTONIX BID.  DENIES ASA/NSAIDs. Problems with sedation: NAUSEA. BMs: DAILY.  LAST ELIQUIS IN AM APR 17.  PT DENIES FEVER, CHILLS, HEMATOCHEZIA, HEMATEMESIS, vomiting, diarrhea, CHEST PAIN, CHANGE IN BOWEL IN HABITS, constipation, abdominal pain, problems swallowing, OR heartburn or indigestion.  Past Medical History:  Diagnosis Date  . Anemia    years ago  . Aortic stenosis   . Arthritis   . Asthma   . Atrial fibrillation (HCC)   . CHF (congestive heart failure) (HCC) 11/2014  . Family history of adverse reaction to anesthesia    2 daughters would have N/V  . Glaucoma   . Hearing loss   . Heart murmur   . HTN (hypertension)   . Pneumonia   . Prolapsing mitral leaflet syndrome   . Shortness of breath dyspnea    with exertion  . SVT (supraventricular  tachycardia) (HCC)    S/P ablation of AVNRT in 2003    Past Surgical History:  Procedure Laterality Date  . APPENDECTOMY    . BIOPSY  04/07/2018   Procedure: BIOPSY;  Surgeon: Pyrtle, Jay M, MD;  Location: [facility];  Service: Gastroenterology;;  . BLADDER SURGERY    . CARDIAC CATHETERIZATION N/A 09/26/2015   Procedure: Right/Left Heart Cath and Coronary Angiography;  Surgeon: Christopher D McAlhany, MD;  Location: [facility];  Service: Cardiovascular;  Laterality: N/A;  . CARDIAC SURGERY    . CARDIOVERSION N/A 03/02/2016   Procedure: CARDIOVERSION;  Surgeon: Philip J Nahser, MD;  Location: [facility];  Service: Cardiovascular;  Laterality: N/A;  . EP IMPLANTABLE DEVICE N/A 10/26/2015   Procedure: Pacemaker Implant;  Surgeon: Will Martin Camnitz, MD;  Location: [facility];  Service: Cardiovascular;  Laterality: N/A;  . ESOPHAGOGASTRODUODENOSCOPY (EGD) WITH PROPOFOL N/A 04/07/2018   Procedure: ESOPHAGOGASTRODUODENOSCOPY (EGD) WITH PROPOFOL;  Surgeon: Pyrtle, Jay M, MD;  Location: [facility];  Service: Gastroenterology;  Laterality: N/A;  . EYE SURGERY Bilateral    cataract surgery  . HOT HEMOSTASIS N/A 04/07/2018   Procedure: HOT HEMOSTASIS (ARGON PLASMA COAGULATION/BICAP);  Surgeon: Pyrtle, Jay M, MD;  Location: [facility];  Service: Gastroenterology;  Laterality: N/A;  . NASAL SINUS SURGERY    . TEE WITHOUT CARDIOVERSION N/A 10/25/2015   Procedure: TRANSESOPHAGEAL ECHOCARDIOGRAM (TEE);  Surgeon: Christopher D McAlhany, MD;  Location: [facility];  Service: Open Heart Surgery;  Laterality: N/A;  . TRANSCATHETER AORTIC VALVE REPLACEMENT, TRANSFEMORAL   Right 10/25/2015   Procedure: TRANSCATHETER AORTIC VALVE REPLACEMENT, TRANSFEMORAL;  Surgeon: Christopher D McAlhany, MD;  Location: [facility];  Service: Open Heart Surgery;  Laterality: Right;    Prior to Admission medications   Medication Sig Start Date End Date Taking? Authorizing Provider  acetaminophen (TYLENOL) 500 MG tablet  Take 500 mg by mouth every 6 (six) hours as needed for mild pain or headache.    Yes [provider]  albuterol (PROAIR HFA) 108 (90 Base) MCG/ACT inhaler Inhale 2 puffs into the lungs every 4 (four) hours as needed for wheezing or shortness of breath. 08/05/19  Yes Rakes, Linda M, FNP  amoxicillin (AMOXIL) 500 MG capsule Take 1 capsule (500 mg total) by mouth 3 (three) times daily. 10/06/19  Yes Stacks, Warren, MD  apixaban (ELIQUIS) 2.5 MG TABS tablet Take 1 tablet (2.5 mg total) by mouth 2 (two) times daily. Patient taking differently: Take 2.5 mg by mouth daily. 1/2 tab am, 1/2 tab pm 08/30/19  Yes Amin, Ankit Chirag, MD  Biotin 10 MG CAPS Take 10 mg by mouth daily.    Yes [provider]  Calcium Carb-Cholecalciferol (CALTRATE 600+D3) 600-800 MG-UNIT TABS Take 1 tablet by mouth daily.   Yes [provider]  docusate sodium (COLACE) 100 MG capsule Take 100 mg by mouth daily as needed for mild constipation.   Yes [provider]  enalapril (VASOTEC) 2.5 MG tablet Take 2.5 mg by mouth daily.   Yes [provider]  ferrous sulfate 325 (65 FE) MG tablet Take 1 tablet (325 mg total) by mouth 2 (two) times daily with a meal. 04/08/18  Yes Vann, Jessica U, DO  fluticasone furoate-vilanterol (BREO ELLIPTA) 100-25 MCG/INH AEPB Inhale 1 puff into the lungs daily. 09/23/19  Yes Rakes, Linda M, FNP  furosemide (LASIX) 40 MG tablet Take 1.5 tablets (60 mg total) by mouth daily. 03/24/19  Yes Rakes, Linda M, FNP  metoprolol tartrate (LOPRESSOR) 25 MG tablet Take 1 tablet (25 mg total) by mouth 2 (two) times daily. 05/20/19  Yes Camnitz, Will Martin, MD  multivitamin-iron-minerals-folic acid (CENTRUM) chewable tablet Chew 1 tablet by mouth daily.   Yes [provider]  pantoprazole (PROTONIX) 40 MG tablet Take 1 tablet (40 mg total) by mouth 2 (two) times daily before a meal. 08/30/19 10/29/19 Yes Amin, Ankit Chirag, MD  predniSONE (DELTASONE) 10 MG tablet See admin  instructions. Take 4 po qd x 2d then 3 po qd x 2d then 2 po qd x 2d then 1 po qd x 2d then stop 10/05/19  Yes [provider]    Current Facility-Administered Medications  Medication Dose Route Frequency Provider Last Rate Last Admin  . acetaminophen (TYLENOL) tablet 500 mg  500 mg Oral Q6H PRN Elgergawy, Dawood S, MD      . amoxicillin (AMOXIL) capsule 500 mg  500 mg Oral TID Elgergawy, Dawood S, MD   500 mg at 10/10/19 2148  . calcium-vitamin D (OSCAL WITH D) 500-200 MG-UNIT per tablet   Oral Daily Elgergawy, Dawood S, MD   1 tablet at 10/10/19 2148  . fluticasone furoate-vilanterol (BREO ELLIPTA) 100-25 MCG/INH 1 puff  1 puff Inhalation Daily Elgergawy, Dawood S, MD   1 puff at 10/11/19 0718  . furosemide (LASIX) tablet 60 mg  60 mg Oral Daily Elgergawy, Dawood S, MD   60 mg at 10/10/19 2148  . ipratropium-albuterol (DUONEB) 0.5-2.5 (3) MG/3ML nebulizer solution 3 mL  3 mL Nebulization Q6H PRN Elgergawy, Dawood S, MD      .   metoprolol tartrate (LOPRESSOR) tablet 25 mg  25 mg Oral BID Elgergawy, Dawood S, MD   25 mg at 10/10/19 2148  . multivitamin with minerals tablet 1 tablet  1 tablet Oral Daily Elgergawy, Dawood S, MD      . pantoprazole (PROTONIX) 80 mg in sodium chloride 0.9 % 100 mL (0.8 mg/mL) infusion  8 mg/hr Intravenous Continuous Elgergawy, Dawood S, MD 10 mL/hr at 10/11/19 0617 8 mg/hr at 10/11/19 0617  . [START ON 10/14/2019] pantoprazole (PROTONIX) injection 40 mg  40 mg Intravenous Q12H Elgergawy, Dawood S, MD        Allergies as of 10/10/2019 - Review Complete 10/10/2019  Allergen Reaction Noted  . Hctz [hydrochlorothiazide] Other (See Comments)   . Aspirin Other (See Comments) 08/23/2019  . Codeine Other (See Comments) 01/11/2016    Family History  Problem Relation Age of Onset  . Hypertension Mother   . Pneumonia Father   . Heart attack Neg Hx   . Colon cancer Neg Hx   . Esophageal cancer Neg Hx      Social History   Socioeconomic History  . Marital  status: Widowed    Spouse name: Not on file  . Number of children: 3  . Years of education: Not on file  . Highest education level: 12th grade  Occupational History  . Occupation: Retired  Tobacco Use  . Smoking status: Never Smoker  . Smokeless tobacco: Never Used  Substance and Sexual Activity  . Alcohol use: No    Alcohol/week: 0.0 standard drinks  . Drug use: No  . Sexual activity: Not Currently  Other Topics Concern  . Not on file  Social History Narrative  . Not on file   Social Determinants of Health   Financial Resource Strain: Low Risk   . Difficulty of Paying Living Expenses: Not hard at all  Food Insecurity: No Food Insecurity  . Worried About Running Out of Food in the Last Year: Never true  . Ran Out of Food in the Last Year: Never true  Transportation Needs: No Transportation Needs  . Lack of Transportation (Medical): No  . Lack of Transportation (Non-Medical): No  Physical Activity: Inactive  . Days of Exercise per Week: 0 days  . Minutes of Exercise per Session: 0 min  Stress: No Stress Concern Present  . Feeling of Stress : Not at all  Social Connections: Slightly Isolated  . Frequency of Communication with Friends and Family: More than three times a week  . Frequency of Social Gatherings with Friends and Family: More than three times a week  . Attends Religious Services: More than 4 times per year  . Active Member of Clubs or Organizations: Yes  . Attends Club or Organization Meetings: More than 4 times per year  . Marital Status: Widowed  Intimate Partner Violence: Not At Risk  . Fear of Current or Ex-Partner: No  . Emotionally Abused: No  . Physically Abused: No  . Sexually Abused: No    Review of Systems: PER HPI OTHERWISE ALL SYSTEMS ARE NEGATIVE.   Vitals: Blood pressure 132/68, pulse 63, temperature 98.6 F (37 C), temperature source Oral, resp. rate 20, height 5' 2" (1.575 m), weight 51.3 kg, SpO2 94 %.  Physical Exam: General:    Alert,  Well-developed, well-nourished, pleasant and cooperative in NAD Head:  Normocephalic and atraumatic. Eyes:  Sclera clear, no icterus.    Mouth:  No lesions, dentition abnormal. Neck:  Supple; no masses. Lungs:  Clear throughout to   auscultation.   No wheezes. No acute distress. Heart:  Regular rate and rhythm; no murmurs. Abdomen:  Soft, nontender and nondistended. No masses noted. Normal bowel sounds, without guarding, and without rebound.   Msk:  Symmetrical without gross deformities. Normal posture. Extremities:  Without edema. Neurologic:  Alert and  oriented x4;  NO  NEW FOCAL DEFICITS, HARD OF HEARING Cervical Nodes:  No significant cervical adenopathy. Psych:  Alert and cooperative. Normal mood and affect.  Lab Results: Recent Labs    10/10/19 1438 10/11/19 0541  WBC 7.7 7.2  HGB 7.6* 8.3*  HCT 25.1* 25.8*  PLT 297 280   BMET Recent Labs    10/10/19 1438 10/11/19 0541  NA 133* 136  K 3.7 3.2*  CL 96* 95*  CO2 28 30  GLUCOSE 148* 85  BUN 29* 23  CREATININE 1.60* 1.23*  CALCIUM 8.5* 8.6*   LFT No results for input(s): PROT, ALBUMIN, AST, ALT, ALKPHOS, BILITOT, BILIDIR, IBILI in the last 72 hours.   Studies/Results: pCXR APR 17: NACPD, CARDIOMEGALY   LOS: 1 day   Kehinde Bowdish  10/11/2019, 8:11 AM  

## 2019-10-12 ENCOUNTER — Encounter (HOSPITAL_COMMUNITY)
Admission: EM | Disposition: A | Discharge status: Home / Self Care | Payer: Self-pay | Source: Home / Self Care | Attending: Internal Medicine

## 2019-10-12 ENCOUNTER — Inpatient Hospital Stay (HOSPITAL_COMMUNITY): Payer: Medicare Other | Admitting: Anesthesiology

## 2019-10-12 ENCOUNTER — Encounter (HOSPITAL_COMMUNITY): Payer: Self-pay | Admitting: Internal Medicine

## 2019-10-12 ENCOUNTER — Ambulatory Visit: Payer: Medicare Other | Admitting: Licensed Clinical Social Worker

## 2019-10-12 DIAGNOSIS — N1831 Chronic kidney disease, stage 3a: Secondary | ICD-10-CM | POA: Diagnosis not present

## 2019-10-12 DIAGNOSIS — I10 Essential (primary) hypertension: Secondary | ICD-10-CM

## 2019-10-12 DIAGNOSIS — I5032 Chronic diastolic (congestive) heart failure: Secondary | ICD-10-CM

## 2019-10-12 DIAGNOSIS — I480 Paroxysmal atrial fibrillation: Secondary | ICD-10-CM | POA: Diagnosis not present

## 2019-10-12 DIAGNOSIS — K219 Gastro-esophageal reflux disease without esophagitis: Secondary | ICD-10-CM

## 2019-10-12 DIAGNOSIS — E871 Hypo-osmolality and hyponatremia: Secondary | ICD-10-CM

## 2019-10-12 DIAGNOSIS — N1832 Chronic kidney disease, stage 3b: Secondary | ICD-10-CM | POA: Diagnosis not present

## 2019-10-12 DIAGNOSIS — J45909 Unspecified asthma, uncomplicated: Secondary | ICD-10-CM | POA: Diagnosis not present

## 2019-10-12 DIAGNOSIS — K922 Gastrointestinal hemorrhage, unspecified: Secondary | ICD-10-CM | POA: Diagnosis not present

## 2019-10-12 HISTORY — PX: HOT HEMOSTASIS: SHX5433

## 2019-10-12 HISTORY — PX: ESOPHAGOGASTRODUODENOSCOPY (EGD) WITH PROPOFOL: SHX5813

## 2019-10-12 LAB — BASIC METABOLIC PANEL
Anion gap: 9 (ref 5–15)
BUN: 16 mg/dL (ref 8–23)
CO2: 29 mmol/L (ref 22–32)
Calcium: 8.5 mg/dL — ABNORMAL LOW (ref 8.9–10.3)
Chloride: 95 mmol/L — ABNORMAL LOW (ref 98–111)
Creatinine, Ser: 1.3 mg/dL — ABNORMAL HIGH (ref 0.44–1.00)
GFR calc Af Amer: 42 mL/min — ABNORMAL LOW (ref >60)
GFR calc non Af Amer: 36 mL/min — ABNORMAL LOW (ref >60)
Glucose, Bld: 90 mg/dL (ref 70–99)
Potassium: 4.6 mmol/L (ref 3.5–5.1)
Sodium: 133 mmol/L — ABNORMAL LOW (ref 135–145)

## 2019-10-12 LAB — CBC
HCT: 27.6 % — ABNORMAL LOW (ref 36.0–46.0)
Hemoglobin: 8.7 g/dL — ABNORMAL LOW (ref 12.0–15.0)
MCH: 31.3 pg (ref 26.0–34.0)
MCHC: 31.5 g/dL (ref 30.0–36.0)
MCV: 99.3 fL (ref 80.0–100.0)
Platelets: 300 10*3/uL (ref 150–400)
RBC: 2.78 MIL/uL — ABNORMAL LOW (ref 3.87–5.11)
RDW: 13.8 % (ref 11.5–15.5)
WBC: 6.4 10*3/uL (ref 4.0–10.5)
nRBC: 0 % (ref 0.0–0.2)

## 2019-10-12 LAB — MAGNESIUM: Magnesium: 2 mg/dL (ref 1.7–2.4)

## 2019-10-12 SURGERY — ESOPHAGOGASTRODUODENOSCOPY (EGD) WITH PROPOFOL
Anesthesia: General

## 2019-10-12 MED ORDER — KETAMINE HCL 10 MG/ML IJ SOLN
INTRAMUSCULAR | Status: DC | PRN
  Administered 2019-10-12 (×2): 10 mg via INTRAVENOUS

## 2019-10-12 MED ORDER — SODIUM CHLORIDE 0.9 % IV SOLN: INTRAVENOUS | Status: DC

## 2019-10-12 MED ORDER — LACTATED RINGERS IV SOLN: INTRAVENOUS | Status: DC

## 2019-10-12 MED ORDER — PANTOPRAZOLE SODIUM 40 MG IV SOLR
INTRAVENOUS | Status: AC
  Filled 2019-10-12: qty 80

## 2019-10-12 MED ORDER — LIDOCAINE 2% (20 MG/ML) 5 ML SYRINGE
INTRAMUSCULAR | Status: AC
  Filled 2019-10-12: qty 5

## 2019-10-12 MED ORDER — LIDOCAINE VISCOUS HCL 2 % MT SOLN
OROMUCOSAL | Status: DC | PRN
  Administered 2019-10-12: 3 mL via OROMUCOSAL

## 2019-10-12 MED ORDER — PROPOFOL 10 MG/ML IV BOLUS
INTRAVENOUS | Status: DC | PRN
  Administered 2019-10-12 (×2): 10 ug via INTRAVENOUS

## 2019-10-12 MED ORDER — KETAMINE HCL 50 MG/5ML IJ SOSY
PREFILLED_SYRINGE | INTRAMUSCULAR | Status: AC
  Filled 2019-10-12: qty 5

## 2019-10-12 MED ORDER — PROPOFOL 500 MG/50ML IV EMUL
INTRAVENOUS | Status: DC | PRN
  Administered 2019-10-12: 75 ug/kg/min via INTRAVENOUS

## 2019-10-12 MED ORDER — PANTOPRAZOLE SODIUM 40 MG PO TBEC
40.0000 mg | DELAYED_RELEASE_TABLET | Freq: Two times a day (BID) | ORAL | Status: DC
  Administered 2019-10-12 – 2019-10-13 (×2): 40 mg via ORAL
  Filled 2019-10-12 (×2): qty 1

## 2019-10-12 NOTE — Progress Notes (Signed)
PROGRESS NOTE    Crystal Cooper  FIE:[id number] DOB: 1927-01-05 DOA: 10/10/2019 PCP: [name], MD    Chief Complaint  Patient presents with  . Abnormal Lab    Brief Narrative:  As per H&P written by Dr. Waldron Labs on 10/10/2019 86 y.o. female, medical history of chronic diastolic CHF on Lasix, hypertension, paroxysmal atrial fibrillation on Eliquis, recent multifocal acute CVA can [name] to A. fib last March, where she was resumed on Eliquis, patient presents to ED secondary to melena, apparently patient had some dyspnea, and confusion earlier in the week, she did follow with urgent care last Tuesday diagnosed with UTI and bronchitis, this has totally improved after taking amoxicillin and steroids, over last 48 hours she did develop melena and bright red blood per rectum, she did follow with urgent care this morning, her hemoglobin was noted to be 6.8, and she was Hemoccult positive, so she was transferred to ED for further evaluation, patient denies any chest pain, fever, chills, coffee-ground emesis, nausea or vomiting, but he does report generalized weakness and fatigue, she reports already for bowel movements with melena and bright red blood per rectum over last 24 hours, with known history of previous GI bleed with her anticoagulation has been stopped, but she was resumed on Eliquis last month given acute CVA, and decision has been made to continue with full anticoagulation, and to deal with GI bleed as it happens. - in ED globin was noted to be 7.6, she received 1 unit PRBC transfusion, her creatinine was 1.6, baseline around 1.2, well MCV was noted to be 102, [facility] hospitalist was consulted to admit.  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 -Continue holding anticoagulation therapy -Continue to follow hemoglobin trend -Continue IV PPI -Transfuse as needed -GI service consultations  and recommendations appreciated; status post endoscopy which demonstrated single gastric AVM.  Essential hypertension -Stable and well-controlled -Continue metoprolol -Rest of medications will be held to assure hemodynamic stability and further  improvement/recovery of renal function.  Acute kidney injury and chronic kidney disease a stage IIIb In the setting of poor perfusion with anemia and continue use of nephrotoxic agents -Continue holding nephrotoxic agents and avoid hypotension. -Maintain adequate hydration -1 unit PRBCs have been provided -Renal function stable and essentially back to baseline currently. -Cr peaked at 1.60 on admission; currently 1.2-1,3  Hypokalemia -Will provide electrolytes repletion -Follow magnesium level -Follow electrolytes trend.  Paroxysmal atrial fibrillation -Rate control and is stable -Continue metoprolol -Holding anticoagulation in the setting of acute GI bleed.  Chronic diastolic heart failure -Overall compensated and denies shortness of breath. -Continue to follow daily weights, I's and O's and continue home dose of Lasix.  Recent diagnosis of UTI and bronchitis -Complete treatment of amoxicillin. -No fever, no dysuria, no coughing spells or shortness of breath currently.  History of recent focal CVA -Appears to be secondary to paroxysmal A. fib and cardioembolic event. -Plan is to resume Eliquis for secondary prevention, once clear by GI service. -Continue risk factor modifications.    DVT prophylaxis: SCDs Code Status: Full code Family Communication: Daughter at bedside Disposition:   Status is: Inpatient  Dispo: The patient is from: Home              Anticipated d/c is to: Home              Anticipated d/c date is: 10/13/19; unless drastically improved overnight and cleared  by GI to go home on 10/12/2019 afternoon.              Patient currently improved after blood transfusion; no overt bleeding appreciated.  Hemoglobin up to  8.3.  Continue holding anticoagulation therapy and follow GI recommendations; plan is to further advance diet and discuss the use of anticoagulation moving forward.  Into perspective benefits and risk.    Consultants:   GI service   Procedures:  -EGD for 10/12/19: Demonstrating Single gastric AVM.   Antimicrobials:  Amoxicillin (1 more day pending).  Subjective: No further overt bleeding.  Afebrile, reports no chest pain, no nausea, no vomiting, no shortness of breath.  Objective: Vitals:   10/12/19 1417 10/12/19 1430 10/12/19 1439 10/12/19 1511  BP: 136/69 (!) 146/75 136/64 (!) 157/62  Pulse: (!) 41 60 (!) 59 60  Resp: (!) 26 (!) 25 (!) 23 18  Temp: 98.2 F (36.8 C)     TempSrc:      SpO2: 100%   97%  Weight:      Height:        Intake/Output Summary (Last 24 hours) at 10/12/2019 1730 Last data filed at 10/12/2019 1500 Gross per 24 hour  Intake 556.67 ml  Output 300 ml  Net 256.67 ml   Filed Weights   10/10/19 1430  Weight: 51.3 kg    Examination: General exam: Alert, awake, and cooperative with examination.  Reports no nausea, no vomiting, no chest pain.  No overt bleeding reported by nursing staff. Respiratory system: Clear to auscultation. Respiratory effort normal. Cardiovascular system:Rate controlled, no JVD, no rubs, no gallops, no clicks.  No pedal edema appreciated on exam. Gastrointestinal system: Abdomen is nondistended, soft and nontender. No organomegaly or masses felt. Normal bowel sounds heard. Central nervous system: No focal neurological deficits. Extremities: No cyanosis or clubbing; muscle strength symmetric bilaterally 4 out of 5 due to poor effort Skin: No rashes, no petechiae Psychiatry: Mood & affect appropriate.    Data Reviewed: I have personally reviewed following labs and imaging studies  CBC: Recent Labs  Lab 10/10/19 1438 10/11/19 0541 10/12/19 0530  WBC 7.7 7.2 6.4  NEUTROABS 6.7  --   --   HGB 7.6* 8.3* 8.7*  HCT 25.1*  25.8* 27.6*  MCV 102.0* 97.4 99.3  PLT 297 280 062    Basic Metabolic Panel: Recent Labs  Lab 10/10/19 1438 10/11/19 0541 10/12/19 0530  NA 133* 136 133*  K 3.7 3.2* 4.6  CL 96* 95* 95*  CO2 28 30 29   GLUCOSE 148* 85 90  BUN 29* 23 16  CREATININE 1.60* 1.23* 1.30*  CALCIUM 8.5* 8.6* 8.5*  MG  --   --  2.0    GFR: Estimated Creatinine Clearance: 22.7 mL/min (A) (by C-G formula based on SCr of 1.3 mg/dL (H)).   Recent Results (from the past 240 hour(s))  SARS CORONAVIRUS 2 (TAT 6-24 HRS) Nasopharyngeal Nasopharyngeal Swab     Status: None   Collection Time: 10/10/19  3:38 PM   Specimen: Nasopharyngeal Swab  Result Value Ref Range Status   SARS Coronavirus 2 NEGATIVE NEGATIVE Final    Comment: (NOTE) SARS-CoV-2 target nucleic acids are NOT DETECTED. The SARS-CoV-2 RNA is generally detectable in upper and lower respiratory specimens during the acute phase of infection. Negative results do not preclude SARS-CoV-2 infection, do not rule out co-infections with other pathogens, and should not be used as the sole basis for treatment or other patient management decisions. Negative results must be combined  with clinical observations, patient history, and epidemiological information. The expected result is Negative. Fact Sheet for Patients: SugarRoll.be Fact Sheet for Healthcare Providers: https://www.woods-mathews.com/ This test is not yet approved or cleared by the [place] FDA and  has been authorized for detection and/or diagnosis of SARS-CoV-2 by FDA under an Emergency Use Authorization (EUA). This EUA will remain  in effect (meaning this test can be used) for the duration of the COVID-19 declaration under Section 56 4(b)(1) of the Act, 21 U.S.C. section 360bbb-3(b)(1), unless the authorization is terminated or revoked sooner. Performed at [facility], [place] [address], [place], [place] [address]      Radiology  Studies: No results found.  Scheduled Meds: . amoxicillin  500 mg Oral Q12H  . calcium-vitamin D   Oral Daily  . fluticasone furoate-vilanterol  1 puff Inhalation Daily  . furosemide  60 mg Oral Daily  . metoprolol tartrate  25 mg Oral BID  . multivitamin with minerals  1 tablet Oral Daily  . pantoprazole  40 mg Oral BID AC   Continuous Infusions: . sodium chloride 50 mL/hr at 10/12/19 0952     LOS: 2 days    Time spent: 30 minutes    Barton Dubois, MD [facility]   To contact the attending provider between 7A-7P or the covering provider during after hours 7P-7A, please log into the web site [email or web address] and access using universal [place] password for that web site. If you do not have the password, please call the hospital operator.  10/12/2019, 5:30 PM

## 2019-10-12 NOTE — Transfer of Care (Signed)
Immediate Anesthesia Transfer of Care Note  Patient: Rayn Shorb Nikolov  Procedure(s) Performed: ESOPHAGOGASTRODUODENOSCOPY (EGD) WITH PROPOFOL (N/A ) HOT HEMOSTASIS (ARGON PLASMA COAGULATION/BICAP)  Patient Location: PACU  Anesthesia Type:MAC  Level of Consciousness: awake and patient cooperative  Airway & Oxygen Therapy: Patient Spontanous Breathing  Post-op Assessment: Report given to RN and Post -op Vital signs reviewed and stable  Post vital signs: Reviewed and stable  Last Vitals:  Vitals Value Taken Time  BP    Temp    Pulse 63 10/12/19 1418  Resp 26 10/12/19 1418  SpO2 99 % 10/12/19 1418  Vitals shown include unvalidated device data.  Last Pain:  Vitals:   10/12/19 1353  TempSrc:   PainSc: 0-No pain      Patients Stated Pain Goal: 6 (07/86/75 4492)  Complications: No apparent anesthesia complications

## 2019-10-12 NOTE — Anesthesia Postprocedure Evaluation (Signed)
Anesthesia Post Note  Patient: Crystal Cooper  Procedure(s) Performed: ESOPHAGOGASTRODUODENOSCOPY (EGD) WITH PROPOFOL (N/A ) HOT HEMOSTASIS (ARGON PLASMA COAGULATION/BICAP)  Patient location during evaluation: PACU Anesthesia Type: General Level of consciousness: awake and alert Pain management: pain level controlled Vital Signs Assessment: post-procedure vital signs reviewed and stable Respiratory status: spontaneous breathing Cardiovascular status: stable Postop Assessment: no apparent nausea or vomiting Anesthetic complications: no     Last Vitals:  Vitals:   10/12/19 1238 10/12/19 1240  BP:  (!) 149/66  Pulse:    Resp:  20  Temp: 37 C   SpO2:  96%    Last Pain:  Vitals:   10/12/19 1353  TempSrc:   PainSc: 0-No pain                 Everette Rank

## 2019-10-12 NOTE — Chronic Care Management (AMB) (Signed)
Chronic Care Management    Clinical Social Work Follow Up Note  10/12/2019 Name: Crystal Cooper MRN: [id number] DOB: 31-Oct-1926  Crystal Cooper is a 86 y.o. year old female who is a primary care patient of Stacks, [name], MD. The CCM team was consulted for assistance with [employer] .   Review of patient status, including review of consultants reports, other relevant assessments, and collaboration with appropriate care team members and the patient's provider was performed as part of comprehensive patient evaluation and provision of chronic care management services.    SDOH (Social Determinants of Health) assessments performed: Yes; risk of social isolation; risk of physical inactivity    Chronic Care Management from 09/11/2019 in [place]  PHQ-9 Total Score  5     GAD 7 : Generalized Anxiety Score 09/11/2019  Nervous, Anxious, on Edge 1  Control/stop worrying 1  Worry too much - different things 1  Trouble relaxing 0  Restless 0  Easily annoyed or irritable 0  Afraid - awful might happen 0  Total GAD 7 Score 3  Anxiety Difficulty Somewhat difficult    Facility-Administered Encounter Medications as of 10/12/2019  Medication  . 0.9 %  sodium chloride infusion  . 0.9 %  sodium chloride infusion  . [MAR Hold] acetaminophen (TYLENOL) tablet 500 mg  . [MAR Hold] amoxicillin (AMOXIL) capsule 500 mg  . [MAR Hold] calcium-vitamin D (OSCAL WITH D) 500-200 MG-UNIT per tablet  . [MAR Hold] fluticasone furoate-vilanterol (BREO ELLIPTA) 100-25 MCG/INH 1 puff  . [MAR Hold] furosemide (LASIX) tablet 60 mg  . [MAR Hold] ipratropium-albuterol (DUONEB) 0.5-2.5 (3) MG/3ML nebulizer solution 3 mL  . lactated ringers infusion  . lidocaine (XYLOCAINE) 2 % viscous mouth solution  . [MAR Hold] metoprolol tartrate (LOPRESSOR) tablet 25 mg  . [MAR Hold] multivitamin with minerals tablet 1 tablet  . pantoprazole (PROTONIX) 80 mg in sodium chloride 0.9 % 100 mL (0.8  mg/mL) infusion  . [MAR Hold] pantoprazole (PROTONIX) injection 40 mg  . propofol (DIPRIVAN) 500 MG/50ML infusion   Outpatient Encounter Medications as of 10/12/2019  Medication Sig Note  . acetaminophen (TYLENOL) 500 MG tablet Take 500 mg by mouth every 6 (six) hours as needed for mild pain or headache.    . albuterol (PROAIR HFA) 108 (90 Base) MCG/ACT inhaler Inhale 2 puffs into the lungs every 4 (four) hours as needed for wheezing or shortness of breath.   [name] amoxicillin (AMOXIL) 500 MG capsule Take 1 capsule (500 mg total) by mouth 3 (three) times daily.   [name] apixaban (ELIQUIS) 2.5 MG TABS tablet Take 1 tablet (2.5 mg total) by mouth 2 (two) times daily. (Patient taking differently: Take 2.5 mg by mouth daily. 1/2 tab am, 1/2 tab pm)   . Biotin 10 MG CAPS Take 10 mg by mouth daily.    . Calcium Carb-Cholecalciferol (CALTRATE 600+D3) 600-800 MG-UNIT TABS Take 1 tablet by mouth daily.   [name] docusate sodium (COLACE) 100 MG capsule Take 100 mg by mouth daily as needed for mild constipation.   . enalapril (VASOTEC) 2.5 MG tablet Take 2.5 mg by mouth daily.   . ferrous sulfate 325 (65 FE) MG tablet Take 1 tablet (325 mg total) by mouth 2 (two) times daily with a meal.   . fluticasone furoate-vilanterol (BREO ELLIPTA) 100-25 MCG/INH AEPB Inhale 1 puff into the lungs daily.   . furosemide (LASIX) 40 MG tablet Take 1.5 tablets (60 mg total) by mouth daily. 08/23/2019: 1.5 tablets = 60  mg  . metoprolol tartrate (LOPRESSOR) 25 MG tablet Take 1 tablet (25 mg total) by mouth 2 (two) times daily.   . multivitamin-iron-minerals-folic acid (CENTRUM) chewable tablet Chew 1 tablet by mouth daily.   . pantoprazole (PROTONIX) 40 MG tablet Take 1 tablet (40 mg total) by mouth 2 (two) times daily before a meal.   . predniSONE (DELTASONE) 10 MG tablet See admin instructions. Take 4 po qd x 2d then 3 po qd x 2d then 2 po qd x 2d then 1 po qd x 2d then stop 10/10/2019: Took 2 tabs today (4/17)     Goals Addressed              This Visit's Progress   . Client will communicate with LCSW in next 30 days to talk about ADLs completion and challenges in doing daily activities of clinet (pt-stated)         Current Barriers:  . Challenges with ADLs completion in client with Chronic Diagnoses of  HTN, CKD, Hyponatremia, GERD, CHF . Some memory challenges  Clinical Social Work Clinical Goal(s):  [name] LCSW will communicate with client in next 30 days to talk with client about ADLs completion and challenges in doing daily activities for client  Interventions:   Discussed CCM program support  Encouraged client/Crystal Cooper to communicate with RNCM as needed for nursing support  Talked with client about her ADLs completion daily  Talked with client about social support network (she said her daughters are supportive) . Talked with client about ambulation challenges of client (uses a cane to ambulate) . Talked with [name], daughter, about client needs (client is [facility] in the hospital [facility] . Talked with [name] about sleeping challenges of client . Talked with [name] about DME equipment of client (client has tub bench at home to use as needed) . Talked with [name] about recent in home care support with client (home health occupational therapist support and physical therapist support)  Patient Self Care Activities:  Attends scheduled medical appointments  . SELF CARE DEFICITS: Challenges in ADLs completion Some mobility challenges  Initial goal documentation      Follow Up Plan: LCSW to call client/daughter, [name], in next 4 weeks to talk with client /daughter about daily ADLs completion of client and about challenges in doing daily activities.  [name].Zohan Shiflet MSW, LCSW Licensed Clinical Social Worker [facility] Care Management [phone]

## 2019-10-12 NOTE — Anesthesia Preprocedure Evaluation (Signed)
Anesthesia Evaluation  Patient identified by MRN, date of birth, ID band Patient awake and Patient confused    Reviewed: Allergy & Precautions, H&P , NPO status , Patient's Chart, lab work & pertinent test results, reviewed documented beta blocker date and time   Airway Mallampati: II  TM Distance: >3 FB Neck ROM: full    Dental no notable dental hx.    Pulmonary shortness of breath, asthma , pneumonia, resolved,    Pulmonary exam normal breath sounds clear to auscultation       Cardiovascular Exercise Tolerance: Good hypertension, +CHF  + pacemaker  Rhythm:regular Rate:Normal     Neuro/Psych CVA negative psych ROS   GI/Hepatic Neg liver ROS, GERD  ,  Endo/Other  negative endocrine ROS  Renal/GU CRFRenal disease  negative genitourinary   Musculoskeletal   Abdominal   Peds  Hematology  (+) Blood dyscrasia, anemia ,   Anesthesia Other Findings   Reproductive/Obstetrics negative OB ROS                             Anesthesia Physical Anesthesia Plan  ASA: III and emergent  Anesthesia Plan: General   Post-op Pain Management:    Induction:   PONV Risk Score and Plan: 3  Airway Management Planned:   Additional Equipment:   Intra-op Plan:   Post-operative Plan:   Informed Consent: I have reviewed the patients History and Physical, chart, labs and discussed the procedure including the risks, benefits and alternatives for the proposed anesthesia with the patient or authorized representative who has indicated his/her understanding and acceptance.     Dental Advisory Given  Plan Discussed with: CRNA  Anesthesia Plan Comments:         Anesthesia Quick Evaluation

## 2019-10-12 NOTE — Op Note (Signed)
[facility] Patient Name: Crystal Cooper Procedure Date: 10/12/2019 1:26 PM MRN: [id number] Date of Birth: 04-Mar-1927 Attending MD: Barney Drain MD, MD CSN: [id number] Age: 86 Admit Type: Inpatient Procedure:                Upper GI endoscopy WITH CONTROL BLEEDING Indications:              Melena ON ELIQUIS. LAST EVALUATED BY GI MAR 2021                            AND FELT TO BE TOO HIGH RISK FOR ANESTHESIA. Providers:                Barney Drain MD, MD, Charlsie Quest. Theda Sers RN, RN,                            Nelma Rothman, Technician Referring MD:             Gerlean Ren, MD                           Lajuan Lines. Hilarie Fredrickson, MD                           , Denice Bors. Crenshaw, MD Medicines:                Propofol per Anesthesia Complications:            No immediate complications. Estimated Blood Loss:     Estimated blood loss was minimal. Procedure:                Pre-Anesthesia Assessment:                           - Prior to the procedure, a History and Physical                            was performed, and patient medications and                            allergies were reviewed. The patient's tolerance of                            previous anesthesia was also reviewed. The risks                            and benefits of the procedure and the sedation                            options and risks were discussed with the patient.                            All questions were answered, and informed consent                            was obtained. Prior Anticoagulants: The patient has  taken Eliquis (apixaban), last dose was 2 days                            prior to procedure. ASA Grade Assessment: III - A                            patient with severe systemic disease. After                            reviewing the risks and benefits, the patient was                            deemed in satisfactory condition to undergo the                            procedure. After  obtaining informed consent, the                            endoscope was passed under direct vision.                            Throughout the procedure, the patient's blood                            pressure, pulse, and oxygen saturations were                            monitored continuously. The [id number]-[id number] ([id number])                            scope was introduced through the mouth, and                            advanced to the second part of duodenum. The upper                            GI endoscopy was accomplished without difficulty.                            The patient tolerated the procedure well. Scope In: 1:53:27 PM Scope Out: 2:02:09 PM Total Procedure Duration: 0 hours 8 minutes 42 seconds  Findings:      The examined esophagus was normal.      A single small angioectasia with no bleeding was found on the greater       curvature of the stomach. Coagulation for bleeding prevention using       argon plasma at 0.8 liters/minute and 30 watts was successful. Estimated       blood loss was minimal.      The examined duodenum was normal. Impression:               - MELENA MAY BE DUE TO singleGASTRIC AVM. Moderate Sedation:      Per Anesthesia Care Recommendation:           - Patient has a contact number available for  emergencies. The signs and symptoms of potential                            delayed complications were discussed with the                            patient. Return to normal activities tomorrow.                            Written discharge instructions were provided to the                            patient.                           - Cardiac diet.                           - Continue present medications. CONSIDER BENEFITS                            V. RISKS OF ELIQUIS. WOULD DISCUSS WITH PT'S                            NEUROLOGIST, CARDIOLOGIST, AND PRIMARY GI DOC.                            CONSIDER ASA OR PLAVIX. CONSIDER BENEFITS V.  RISKS                            OF COLONOSCOPY IN THE NEAR FUTURE.                           - Return to GI office in 4-6 weeks. Procedure Code(s):        --- Professional ---                           [phone], Esophagogastroduodenoscopy, flexible,                            transoral; with control of bleeding, any method Diagnosis Code(s):        --- Professional ---                           H67.591, Angiodysplasia of stomach and duodenum                            without bleeding                           K92.1, Melena (includes Hematochezia) CPT copyright 2019 American Medical Association. All rights reserved. The codes documented in this report are preliminary and upon coder review may  be revised to meet current compliance requirements. Barney Drain, MD Barney Drain MD, MD 10/12/2019 2:22:35 PM This report has been signed electronically. Number of Addenda: 0

## 2019-10-12 NOTE — Interval H&P Note (Signed)
History and Physical Interval Note:  10/12/2019 1:39 PM  Crystal Cooper  has presented today for surgery, with the diagnosis of MELENA.  The various methods of treatment have been discussed with the patient and family. After consideration of risks, benefits and other options for treatment, the patient has consented to  Procedure(s): ESOPHAGOGASTRODUODENOSCOPY (EGD) WITH PROPOFOL (N/A) as a surgical intervention.  The patient's history has been reviewed, patient examined, no change in status, stable for surgery.  I have reviewed the patient's chart and labs.  Questions were answered to the patient's satisfaction.     [employer]

## 2019-10-12 NOTE — Patient Instructions (Addendum)
Licensed Clinical Social Worker Visit Information  Goals we discussed today:  Goals Addressed            This Visit's Progress   . Client will communicate with LCSW in next 30 days to talk about ADLs completion and challenges in doing daily activities of clinet (pt-stated)         Current Barriers:  . Challenges with ADLs completion in client with Chronic Diagnoses of  HTN, CKD, Hyponatremia, GERD, CHF . Some memory challenges  Clinical Social Work Clinical Goal(s):  [name] LCSW will communicate with client in next 30 days to talk with client about ADLs completion and challenges in doing daily activities for client  Interventions:  Discussed CCM program support  Encouraged client/Crystal Cooper to communicate with RNCM as needed for nursing support  Talked with client about her ADLs completion daily  Talked with client about social support network (she said her daughters are supportive) Talked with client about ambulation challenges of client (uses a cane to ambulate)  Talked with [name], daughter, about client needs (client is currently in the hospital at [facility].)  Talked with [name] about sleeping challenges of client  Talked with [name] about DME equipment of client (client has tub bench at home to use as needed)  Talked with [name] about recent in home care support with client (home health occupational therapist support and physical therapist support)  Patient Self Care Activities:  Attends scheduled medical appointments  . SELF CARE DEFICITS: Challenges in ADLs completion Some mobility challenges  Initial goal documentation       Materials Provided: No  Follow Up Plan:LCSW to call client/daughter, [name], in next 4 weeks to talk with client /daughter about daily ADLs completion of client and about challenges in doing daily activities.  The patient/Crystal Cooper, daughter, verbalized understanding of instructions provided today and declined a print copy of patient instruction  materials.    Crystal Cooper.Crystal Cooper MSW, LCSW Licensed Clinical Social Worker [facility] Care [facility] [phone]

## 2019-10-12 NOTE — Care Management Important Message (Signed)
Important Message  Patient Details  Name: Crystal Cooper MRN: [id number] Date of Birth: 1926/11/02   Medicare Important Message Given:  Yes     Tommy Medal 10/12/2019, 12:08 PM

## 2019-10-13 ENCOUNTER — Telehealth: Payer: Self-pay | Admitting: Gastroenterology

## 2019-10-13 ENCOUNTER — Telehealth: Payer: Self-pay | Admitting: Family Medicine

## 2019-10-13 ENCOUNTER — Ambulatory Visit: Payer: Medicare Other | Admitting: Neurology

## 2019-10-13 DIAGNOSIS — N1832 Chronic kidney disease, stage 3b: Secondary | ICD-10-CM

## 2019-10-13 DIAGNOSIS — Z7901 Long term (current) use of anticoagulants: Secondary | ICD-10-CM | POA: Diagnosis not present

## 2019-10-13 DIAGNOSIS — K922 Gastrointestinal hemorrhage, unspecified: Secondary | ICD-10-CM

## 2019-10-13 DIAGNOSIS — I10 Essential (primary) hypertension: Secondary | ICD-10-CM | POA: Diagnosis not present

## 2019-10-13 DIAGNOSIS — D649 Anemia, unspecified: Secondary | ICD-10-CM | POA: Diagnosis not present

## 2019-10-13 DIAGNOSIS — N179 Acute kidney failure, unspecified: Secondary | ICD-10-CM

## 2019-10-13 MED ORDER — FAMOTIDINE 20 MG PO TABS: 20.0000 mg | ORAL_TABLET | Freq: Every day | ORAL | 1 refills | Status: DC

## 2019-10-13 MED ORDER — APIXABAN 2.5 MG PO TABS: 2.5000 mg | ORAL_TABLET | Freq: Two times a day (BID) | ORAL | Status: DC

## 2019-10-13 NOTE — Telephone Encounter (Signed)
[facility] clinical pool: please help facilitate outpatient follow-up with Dr. Hilarie Fredrickson due to recent admission for UGI bleed on Eliquis, s/p EGD this admission.

## 2019-10-13 NOTE — TOC Transition Note (Signed)
Transition of Care [facility]) - CM/SW Discharge Note   Patient Details  Name: Crystal Cooper MRN: [id number] Date of Birth: [birth year]  Transition of Care [facility]) CM/SW Contact:  Graelyn Bihl, Chauncey Reading, RN Phone Number: 10/13/2019, 1:12 PM   Clinical Narrative: Patient discharging today. Will resume home health. Tommi Rumps of [place] notified      Final next level of care: [place] Barriers to Discharge: Barriers Resolved         Discharge Plan and Services In-house Referral: Clinical Social Work   Post Acute Care Choice: Resumption of Svcs/PTA Provider                    HH Arranged: PT, RN, OT HH Agency: [place] Date [place]: 10/13/19 Time [place]: 89 Representative spoke with at [place]: [place] ([facility]) Interventions     Readmission Risk Interventions Readmission Risk Prevention Plan 10/11/2019  Transportation Screening Complete  HRI or Home Care Consult Complete  Palliative Care Screening Not Applicable  Medication Review (RN Care Manager) Complete  Some recent data might be hidden

## 2019-10-13 NOTE — Addendum Note (Signed)
Addended by: Cheron Every on: 10/13/2019 11:56 AM   Modules accepted: Orders

## 2019-10-13 NOTE — Telephone Encounter (Signed)
TRANSITIONAL CARE MANAGEMENT TELEPHONE OUTREACH NOTE   Contact Date: 10/13/2019 Contacted By: Ward INFORMATION Date of Discharge:10/13/2019 Discharge Facility: [facility] Discharge Diagnosis:Syptomatic Anemia   Outpatient Follow Up Recommendations (copied from discharge summary) Recommendations for Outpatient Follow-up:  1. Repeat basic metabolic panel to reassess electrolytes and renal function. 2. Repeat CBC to follow hemoglobin trend 3. Closely follow patient's blood pressure and if renal function stable determine the need of resuming Vasotec at follow-up visit. 4. Further discussion needs to happen for final decision on treatment of secondary prevention given ongoing bleeding.  Eliquis held following GI recommendation at time of discharge.  Crystal Cooper is a female primary care patient of Stacks, Crystal Gash, MD. An outgoing telephone call was made today and I spoke with Crystal Cooper(daughter).  [name] condition(s) and treatment(s) were discussed. An opportunity to ask questions was provided and all were answered or forwarded as appropriate.    ACTIVITIES OF DAILY LIVING  Crystal Cooper lives with their family and she cannot perform ADLs independently. her primary caregiver is CNA. she is able to depend on her primary caregiver(s) for consistent help. Transportation to appointments, to pick up medications, and to run errands is a problem.  (Consider referral to [facility] CCM if transportation or a consistent caregiver is a problem)   Fall Risk Fall Risk  09/23/2019 09/08/2019  Falls in the past year? 0 0  Number falls in past yr: - -  Injury with Fall? - -  Risk for fall due to : - -  Follow up - -    high [place] Modifications/Assistive Devices Wheelchair: No Cane: Yes Ramp: No Bedside Toilet: No Hospital Bed:  No Other:    [place] she is receiving home health Physical therapy and OT and nursing services.      MEDICATION RECONCILIATION  [name] has been able to pick-up all prescribed discharge medications from the pharmacy.   A post discharge medication reconciliation was performed and the complete medication list was reviewed with the patient/caregiver and is current as of 10/13/2019. Changes highlighted below.  Discontinued Medications   Current Medication List Allergies as of 10/13/2019      Reactions   Hctz [hydrochlorothiazide] Other (See Comments)   Pt was ill and this affected her kidneys   Aspirin Other (See Comments)   Cardiologist said the patient is to not take this   Codeine Other (See Comments)   Made the patient feel ill, has not had any problems since 1977      Medication List       Accurate as of October 13, 2019  3:52 PM. If you have any questions, ask your nurse or doctor.        acetaminophen 500 MG tablet Commonly known as: TYLENOL Take 500 mg by mouth every 6 (six) hours as needed for mild pain or headache.   albuterol 108 (90 Base) MCG/ACT inhaler Commonly known as: ProAir HFA Inhale 2 puffs into the lungs every 4 (four) hours as needed for wheezing or shortness of breath.   apixaban 2.5 MG Tabs tablet Commonly known as: ELIQUIS Take 1 tablet (2.5 mg total) by mouth 2 (two) times daily. HOLD UNTIL FOLLOW UP WITH PCP.   Biotin 10 MG Caps Take 10 mg by mouth daily.   Breo Ellipta 100-25 MCG/INH Aepb Generic drug: fluticasone furoate-vilanterol Inhale 1 puff into the lungs daily.   Caltrate 600+D3 600-800 MG-UNIT Tabs Generic drug:  Calcium Carb-Cholecalciferol Take 1 tablet by mouth daily.   docusate sodium 100 MG capsule Commonly known as: COLACE Take 100 mg by mouth daily as needed for mild constipation.   famotidine 20 MG tablet Commonly known as: Pepcid Take 1 tablet (20 mg total) by mouth at bedtime.   ferrous sulfate 325 (65 FE) MG tablet Take 1 tablet (325 mg total) by mouth 2 (two) times daily with a meal.   furosemide 40 MG  tablet Commonly known as: LASIX Take 1.5 tablets (60 mg total) by mouth daily.   metoprolol tartrate 25 MG tablet Commonly known as: LOPRESSOR Take 1 tablet (25 mg total) by mouth 2 (two) times daily.   multivitamin-iron-minerals-folic acid chewable tablet Chew 1 tablet by mouth daily.   pantoprazole 40 MG tablet Commonly known as: PROTONIX Take 1 tablet (40 mg total) by mouth 2 (two) times daily before a meal.        PATIENT EDUCATION & FOLLOW-UP PLAN  An appointment for Transitional Care Management is scheduled with Crystal Dun FNP on 10/19/2019 at 2:10pm.  Take all medications as prescribed  Contact our office by calling [phone] if you have any questions or concerns

## 2019-10-13 NOTE — Discharge Summary (Signed)
Physician Discharge Summary  Crystal Cooper QAS:[id number] DOB: 03-Apr-1927 DOA: 10/10/2019  PCP: Crystal Fraise, MD  Admit date: 10/10/2019 Discharge date: 10/13/2019  Time spent: 35 minutes  Recommendations for Outpatient Follow-up:  1. Repeat basic metabolic panel to reassess electrolytes and renal function. 2. Repeat CBC to follow hemoglobin trend 3. Closely follow patient's blood pressure and if renal function stable determine the need of resuming Vasotec at follow-up visit. 4. Further discussion needs to happen for final decision on treatment of secondary prevention given ongoing bleeding.  Eliquis held following GI recommendation at time of discharge.   Discharge Diagnoses:  Active Problems:   Essential hypertension   PAF (paroxysmal atrial fibrillation) (HCC)   Iron deficiency anemia   GI bleed   CKD (chronic kidney disease), stage III   Congestive heart failure ([place])   Long term (current) use of anticoagulants   AKI (acute kidney injury) ([place])   Discharge Condition: Stable and improved.  No further bleeding appreciated at discharge.  Follow-up with PCP, cardiology and primary gastroenterologist (Dr. Hilarie Cooper) has been recommended at time of discharge.  Code status: full code.  Diet recommendation: Heart healthy diet  Filed Weights   10/10/19 1430  Weight: 51.3 kg    History of present illness:  As per H&P written by Dr. Waldron Labs on 10/10/2019 86 y.o.female,medical history of chronic diastolic CHF on Lasix, hypertension, paroxysmal atrial fibrillation on Eliquis, recent multifocal acute CVA can [name] to A. fib last March, where she was resumed on Eliquis, patient presents to ED secondary to melena, apparently patient had some dyspnea, and confusion earlier in the week, she did follow with urgent care last Tuesday diagnosed with UTI and bronchitis, this has totally improved after taking amoxicillin and steroids, over last48hours she did develop melena and bright red blood  per rectum,she did follow with urgent care this morning, her hemoglobin was noted to be 6.8, and she was Hemoccult positive, so she was transferred to ED for further evaluation, patient denies any chest pain, fever, chills, coffee-ground emesis, nausea or vomiting, but he does report generalized weakness and fatigue, she reports already for bowel movements with melena and bright red blood per rectum over last 24 hours,with known history of previous GI bleed with her anticoagulation has been stopped, but she was resumed on Eliquis last month given acute CVA, and decision has been made to continue with full anticoagulation, and to deal with GI bleed as it happens. - in [place] was noted to be 7.6, she received 1 unit PRBC transfusion, her creatinine was 1.6, baseline around 1.2, well MCV was noted to be 102,[facility] hospitalist was consulted to admit.  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 -Continue holding anticoagulation therapy at time of discharge. -Hemoglobin 8.7 and no signs of overt bleeding. -Repeat CBC to follow hemoglobin trend. -Continue Protonix twice daily and Pepcid nightly at time of discharge. -Patient received 1 unit of PRBCs during this hospitalization. -GI service consultations and recommendations appreciated; status post endoscopy which demonstrated single gastric AVM.  Essential hypertension -Stable and well-controlled -Continue metoprolol -Heart healthy diet has been encouraged -Vasotec discontinued at time of discharge, given soft blood pressure and acute on chronic renal failure.  Reassess stability in her vital signs and renal function before resuming medication again if required.  Acute kidney injury and chronic kidney disease a stage IIIb In the setting of poor perfusion with anemia and continue use of  nephrotoxic agents -Continue holding nephrotoxic agents and  avoid hypotension. -Maintain adequate hydration -1 unit PRBCs provided -Renal function stable and essentially back to baseline at discharge.  -Cr peaked at 1.60 on admission; currently 1.2-1,3 -Repeat basic metabolic panel to follow renal function and stability.  Hypokalemia -Repleted and is stable at discharge -Repeat basic metabolic panel to follow electrolytes stability.  Paroxysmal atrial fibrillation -Rate control and sinus currently. -Continue metoprolol for rate control -Continue holding anticoagulation in the setting of acute GI bleed. -Patient remains at high risk for further bleeding.  Chronic diastolic heart failure -Overall compensated and denying shortness of breath. -Continue to follow daily weights, low-sodium diet and continue home dose of Lasix. -Continue outpatient follow-up with cardiology service.  Recent diagnosis of UTI and bronchitis -Completed treatment of amoxicillin. -No fever, no dysuria, no coughing spells or shortness of breath currently. -Advised to maintain adequate hydration.  History of recent focal nonhemorrhagic CVA -Appears to be secondary to paroxysmal A. fib and cardioembolic event. -Plan is to hold Eliquis at discharge until patient's follow-up with PCP and further discussion with cardiology service, neurologist and primary gastroenterology service are done.  Patient continued to high risk for further bleeding. -Alternative of either aspirin or Plavix has been suggested by gastroenterology service. Given her elevated CHADSVASC score, those options will be sub-optimal. -Continue risk factor modifications.  Procedures: -EGD for 10/12/19: Demonstrating Single gastric AVM.  Consultations:  GI  Discharge Exam: Vitals:   10/13/19 0755 10/13/19 1045  BP:  (!) 135/56  Pulse:  (!) 59  Resp:    Temp:    SpO2: 97%    General exam: Alert, awake, and cooperative with examination.  Reports no nausea, no vomiting, no chest pain.  No overt  bleeding reported. Respiratory system: Clear to auscultation. Respiratory effort normal. Cardiovascular system:Rate controlled, no JVD, no rubs, no gallops, no clicks.  No pedal edema appreciated on exam. Gastrointestinal system: Abdomen is nondistended, soft and nontender. No organomegaly or masses felt. Normal bowel sounds heard. Central nervous system: No focal neurological deficits. Extremities: No cyanosis or clubbing; muscle strength symmetric bilaterally 4 out of 5 due to poor effort Skin: No rashes, no petechiae Psychiatry: Mood & affect appropriate.    Discharge Instructions   Discharge Instructions    (HEART FAILURE PATIENTS) Call MD:  Anytime you have any of the following symptoms: 1) 3 pound weight gain in 24 hours or 5 pounds in 1 week 2) shortness of breath, with or without a dry hacking cough 3) swelling in the hands, feet or stomach 4) if you have to sleep on extra pillows at night in order to breathe.   Complete by: As directed    Diet - low sodium heart healthy   Complete by: As directed    Discharge instructions   Complete by: As directed    Maintain adequate hydration Continue holding Eliquis on to follow-up with PCP/primary GI physician Follow heart healthy diet Take medications as prescribed.     Allergies as of 10/13/2019      Reactions   Hctz [hydrochlorothiazide] Other (See Comments)   Pt was ill and this affected her kidneys   Aspirin Other (See Comments)   Cardiologist said the patient is to not take this   Codeine Other (See Comments)   Made the patient feel ill, has not had any problems since 1977      Medication List    STOP taking these medications   amoxicillin 500 MG capsule Commonly known as: AMOXIL  enalapril 2.5 MG tablet Commonly known as: VASOTEC   predniSONE 10 MG tablet Commonly known as: DELTASONE     TAKE these medications   acetaminophen 500 MG tablet Commonly known as: TYLENOL Take 500 mg by mouth every 6 (six) hours as  needed for mild pain or headache.   albuterol 108 (90 Base) MCG/ACT inhaler Commonly known as: ProAir HFA Inhale 2 puffs into the lungs every 4 (four) hours as needed for wheezing or shortness of breath.   apixaban 2.5 MG Tabs tablet Commonly known as: ELIQUIS Take 1 tablet (2.5 mg total) by mouth 2 (two) times daily. HOLD UNTIL FOLLOW UP WITH PCP. What changed: additional instructions   Biotin 10 MG Caps Take 10 mg by mouth daily.   Breo Ellipta 100-25 MCG/INH Aepb Generic drug: fluticasone furoate-vilanterol Inhale 1 puff into the lungs daily.   Caltrate 600+D3 600-800 MG-UNIT Tabs Generic drug: Calcium Carb-Cholecalciferol Take 1 tablet by mouth daily.   docusate sodium 100 MG capsule Commonly known as: COLACE Take 100 mg by mouth daily as needed for mild constipation.   famotidine 20 MG tablet Commonly known as: Pepcid Take 1 tablet (20 mg total) by mouth at bedtime.   ferrous sulfate 325 (65 FE) MG tablet Take 1 tablet (325 mg total) by mouth 2 (two) times daily with a meal.   furosemide 40 MG tablet Commonly known as: LASIX Take 1.5 tablets (60 mg total) by mouth daily.   metoprolol tartrate 25 MG tablet Commonly known as: LOPRESSOR Take 1 tablet (25 mg total) by mouth 2 (two) times daily.   multivitamin-iron-minerals-folic acid chewable tablet Chew 1 tablet by mouth daily.   pantoprazole 40 MG tablet Commonly known as: PROTONIX Take 1 tablet (40 mg total) by mouth 2 (two) times daily before a meal.      Allergies  Allergen Reactions  . Hctz [Hydrochlorothiazide] Other (See Comments)    Pt was ill and this affected her kidneys   . Aspirin Other (See Comments)    Cardiologist said the patient is to not take this  . Codeine Other (See Comments)    Made the patient feel ill, has not had any problems since 1977   Follow-up Information    Stacks, [name], MD. Schedule an appointment as soon as possible for a visit in 10 day(s).   Specialty: Family  Medicine Contact information: [place] [address] [phone]           The results of significant diagnostics from this hospitalization (including imaging, microbiology, ancillary and laboratory) are listed below for reference.    Significant Diagnostic Studies: DG Chest Port 1 View  Result Date: 10/10/2019 CLINICAL DATA:  This of breath EXAM: PORTABLE CHEST 1 VIEW COMPARISON:  Chest x-rays dated 10/05/2019 and 12/12/2014. FINDINGS: Stable cardiomegaly. Stable chronic scarring/fibrosis within the upper lobes with associated hilar retractions. No new lung findings. No pleural effusion or pneumothorax. Valvular hardware is stable in position. LEFT chest wall pacemaker/ICD hardware appears stable in position. No acute appearing osseous abnormality. Aortic atherosclerosis. IMPRESSION: 1. No active disease. No evidence of pneumonia or pulmonary edema. 2. Stable cardiomegaly. 3. Stable chronic appearing changes within the upper lobes, corresponding to previous chest CT reports describing mucoid impaction. 4. Aortic atherosclerosis. Electronically Signed   By: Franki Cabot M.D.   On: 10/10/2019 15:41    Microbiology: Recent Results (from the past 240 hour(s))  SARS CORONAVIRUS 2 (TAT 6-24 HRS) Nasopharyngeal Nasopharyngeal Swab     Status: None  Collection Time: 10/10/19  3:38 PM   Specimen: Nasopharyngeal Swab  Result Value Ref Range Status   SARS Coronavirus 2 NEGATIVE NEGATIVE Final    Comment: (NOTE) SARS-CoV-2 target nucleic acids are NOT DETECTED. The SARS-CoV-2 RNA is generally detectable in upper and lower respiratory specimens during the acute phase of infection. Negative results do not preclude SARS-CoV-2 infection, do not rule out co-infections with other pathogens, and should not be used as the sole basis for treatment or other patient management decisions. Negative results must be combined with clinical observations, patient history, and epidemiological  information. The expected result is Negative. Fact Sheet for Patients: SugarRoll.be Fact Sheet for Healthcare Providers: https://www.woods-mathews.com/ This test is not yet approved or cleared by the [place] FDA and  has been authorized for detection and/or diagnosis of SARS-CoV-2 by FDA under an Emergency Use Authorization (EUA). This EUA will remain  in effect (meaning this test can be used) for the duration of the COVID-19 declaration under Section 56 4(b)(1) of the Act, 21 U.S.C. section 360bbb-3(b)(1), unless the authorization is terminated or revoked sooner. Performed at [facility], [place] [address] [place], [place] [address]      Labs: Basic Metabolic Panel: Recent Labs  Lab 10/10/19 1438 10/11/19 0541 10/12/19 0530  NA 133* 136 133*  K 3.7 3.2* 4.6  CL 96* 95* 95*  CO2 28 30 29   GLUCOSE 148* 85 90  BUN 29* 23 16  CREATININE 1.60* 1.23* 1.30*  CALCIUM 8.5* 8.6* 8.5*  MG  --   --  2.0   CBC: Recent Labs  Lab 10/10/19 1438 10/11/19 0541 10/12/19 0530  WBC 7.7 7.2 6.4  NEUTROABS 6.7  --   --   HGB 7.6* 8.3* 8.7*  HCT 25.1* 25.8* 27.6*  MCV 102.0* 97.4 99.3  PLT 297 280 300    BNP (last 3 results) Recent Labs    08/26/19 1135  BNP 483.0*    Signed:  Barton Dubois MD.  [facility] 10/13/2019, 12:48 PM

## 2019-10-13 NOTE — Progress Notes (Signed)
    Subjective: No melena. No abdominal pain, N/V. States she doesn't remember eating breakfast.   Objective: Vital signs in last 24 hours: Temp:  [97.6 F (36.4 C)-98.6 F (37 C)] 97.6 F (36.4 C) (04/20 0555) Pulse Rate:  [41-61] 60 (04/20 0555) Resp:  [18-26] 20 (04/20 0555) BP: (93-157)/(44-76) 129/76 (04/20 0555) SpO2:  [94 %-100 %] 97 % (04/20 0755) Last BM Date: 10/12/19 General:   Alert and oriented, pleasant Head:  Normocephalic and atraumatic. Abdomen:  Bowel sounds present, soft, non-tender, non-distended.  Extremities:  Without edema. Neurologic:  Alert and  oriented x4  Intake/Output from previous day: 04/19 0701 - 04/20 0700 In: 556.7 [P.O.:300; I.V.:256.7] Out: 1300 [Urine:800] Intake/Output this shift: No intake/output data recorded.  Lab Results: Recent Labs    10/10/19 1438 10/11/19 0541 10/12/19 0530  WBC 7.7 7.2 6.4  HGB 7.6* 8.3* 8.7*  HCT 25.1* 25.8* 27.6*  PLT 297 280 300   BMET Recent Labs    10/10/19 1438 10/11/19 0541 10/12/19 0530  NA 133* 136 133*  K 3.7 3.2* 4.6  CL 96* 95* 95*  CO2 28 30 29   GLUCOSE 148* 85 90  BUN 29* 23 16  CREATININE 1.60* 1.23* 1.30*  CALCIUM 8.5* 8.6* 8.5*    Assessment: 86 year old female admitted with melena, acute blood loss anemia on Eliquis, s/p EGD yesterday with single gastric AVM s/p APC as possible source. Hgb stable, receiving 1 unit PRBCs this admission. Admitting Hgb 7.6, improved to 8.7. No further overt GI bleeding.   Recommend discussion of risks and benefits with neurologist, cardiology, and primary GI (Dr. Hilarie Fredrickson) prior to resuming Eliquis, considering aspirin or Plavix. Colonoscopy can be discussed with primary GI as outpatient.  Hopeful discharge today with close follow-up with primary GI. We will reach out to ensure she has follow-up with Dr. Hilarie Fredrickson.   Plan:  Continue Protonix BID  Eliquis on hold: need risks and benefits discussion with Neuro, cardiology, and primary GI (Dr.  Hilarie Fredrickson) before resuming.  Hopeful discharge today.   Will help facilitate follow-up with Dr. Hilarie Fredrickson as outpatient.  Annitta Needs, PhD, ANP-BC [facility]    LOS: 3 days    10/13/2019, 9:57 AM

## 2019-10-13 NOTE — Telephone Encounter (Signed)
Referral sent to Dr. Hilarie Fredrickson office

## 2019-10-13 NOTE — Addendum Note (Signed)
Addendum  created 10/13/19 0808 by Vista Deck, CRNA   Charge Capture section accepted

## 2019-10-13 NOTE — Discharge Instructions (Signed)
Anemia  Anemia is a condition in which you do not have enough red blood cells or hemoglobin. Hemoglobin is a substance in red blood cells that carries oxygen. When you do not have enough red blood cells or hemoglobin (are anemic), your body cannot get enough oxygen and your organs may not work properly. As a result, you may feel very tired or have other problems. What are the causes? Common causes of anemia include:  Excessive bleeding. Anemia can be caused by excessive bleeding inside or outside the body, including bleeding from the intestine or from periods in women.  Poor nutrition.  Long-lasting (chronic) kidney, thyroid, and liver disease.  Bone marrow disorders.  Cancer and treatments for cancer.  HIV (human immunodeficiency virus) and AIDS (acquired immunodeficiency syndrome).  Treatments for HIV and AIDS.  Spleen problems.  Blood disorders.  Infections, medicines, and autoimmune disorders that destroy red blood cells. What are the signs or symptoms? Symptoms of this condition include:  Minor weakness.  Dizziness.  Headache.  Feeling heartbeats that are irregular or faster than normal (palpitations).  Shortness of breath, especially with exercise.  Paleness.  Cold sensitivity.  Indigestion.  Nausea.  Difficulty sleeping.  Difficulty concentrating. Symptoms may occur suddenly or develop slowly. If your anemia is mild, you may not have symptoms. How is this diagnosed? This condition is diagnosed based on:  Blood tests.  Your medical history.  A physical exam.  Bone marrow biopsy. Your health care provider may also check your stool (feces) for blood and may do additional testing to look for the cause of your bleeding. You may also have other tests, including:  Imaging tests, such as a CT scan or MRI.  Endoscopy.  Colonoscopy. How is this treated? Treatment for this condition depends on the cause. If you continue to lose a lot of blood, you may  need to be treated at a hospital. Treatment may include:  Taking supplements of iron, vitamin S31, or folic acid.  Taking a hormone medicine (erythropoietin) that can help to stimulate red blood cell growth.  Having a blood transfusion. This may be needed if you lose a lot of blood.  Making changes to your diet.  Having surgery to remove your spleen. Follow these instructions at home:  Take over-the-counter and prescription medicines only as told by your health care provider.  Take supplements only as told by your health care provider.  Follow any diet instructions that you were given.  Keep all follow-up visits as told by your health care provider. This is important. Contact a health care provider if:  You develop new bleeding anywhere in the body. Get help right away if:  You are very weak.  You are short of breath.  You have pain in your abdomen or chest.  You are dizzy or feel faint.  You have trouble concentrating.  You have bloody or black, tarry stools.  You vomit repeatedly or you vomit up blood. Summary  Anemia is a condition in which you do not have enough red blood cells or enough of a substance in your red blood cells that carries oxygen (hemoglobin).  Symptoms may occur suddenly or develop slowly.  If your anemia is mild, you may not have symptoms.  This condition is diagnosed with blood tests as well as a medical history and physical exam. Other tests may be needed.  Treatment for this condition depends on the cause of the anemia. This information is not intended to replace advice given to you by  your health care provider. Make sure you discuss any questions you have with your health care provider. Document Revised: 05/24/2017 Document Reviewed: 07/13/2016 Elsevier Patient Education  [place].   Aspirin and Your Heart  Aspirin is a medicine that prevents the cells in the blood that are used for clotting, called platelets, from sticking  together. Aspirin can be used to help reduce the risk of blood clots, heart attacks, and other heart-related problems. Can I take aspirin? Your health care provider will help you determine whether it is safe and beneficial for you to take aspirin daily. Taking aspirin daily may be helpful if you:  Have had a heart attack or chest pain.  Are at risk for a heart attack.  Have undergone open-heart surgery, such as coronary artery bypass surgery (CABG).  Have had coronary angioplasty or a stent.  Have had certain types of stroke or transient ischemic attack (TIA).  Have peripheral artery disease (PAD).  Have chronic heart rhythm problems such as atrial fibrillation and cannot take an anticoagulant.  Have valve disease or have had surgery on a valve. What are the risks? Daily use of aspirin can cause side effects. Some of these include:  Bleeding. Bleeding problems can be minor or serious. An example of a minor problem is a cut that does not stop bleeding. An example of a more serious problem is stomach bleeding or, rarely, bleeding into the brain. Your risk of bleeding is increased if you are also taking non-steroidal anti-inflammatory drugs (NSAIDs).  Increased bruising.  Upset stomach.  An allergic reaction. People who have nasal polyps have an increased risk of developing an aspirin allergy. General guidelines  Take aspirin only as told by your health care provider. Make sure that you understand how much you should take and what form you should take. The two forms of aspirin are: ? Non-enteric-coated.This type of aspirin does not have a coating and is absorbed quickly. This type of aspirin also comes in a chewable form. ? Enteric-coated. This type of aspirin has a coating that releases the medicine very slowly. Enteric-coated aspirin might cause less stomach upset than non-enteric-coated aspirin. This type of aspirin should not be chewed or crushed.  Limit alcohol intake to no more  than 1 drink a day for nonpregnant women and 2 drinks a day for men. Drinking alcohol increases your risk of bleeding. One drink equals 12 oz of beer, 5 oz of wine, or 1 oz of hard liquor. Contact a health care provider if you:  Have unusual bleeding or bruising.  Have stomach pain or nausea.  Have ringing in your ears.  Have an allergic reaction that causes: ? Hives. ? Itchy skin. ? Swelling of the lips, tongue, or face. Get help right away if you:  Notice that your bowel movements are bloody, dark red, or black in color.  Vomit or cough up blood.  Have blood in your urine.  Cough, have noisy breathing (wheeze), or feel short of breath.  Have chest pain, especially if the pain spreads to the arms, back, neck, or jaw.  Have a severe headache, or a headache with confusion, or dizziness. These symptoms may represent a serious problem that is an emergency. Do not wait to see if the symptoms will go away. Get medical help right away. Call your local emergency services (911 in the [place] Do not drive yourself to the hospital. Summary  Aspirin can be used to help reduce the risk of blood clots, heart attacks,  and other heart-related problems.  Daily use of aspirin can increase your risk of side effects. Your health care provider will help you determine whether it is safe and beneficial for you to take aspirin daily.  Take aspirin only as told by your health care provider. Make sure that you understand how much you can take and what form you can take. This information is not intended to replace advice given to you by your health care provider. Make sure you discuss any questions you have with your health care provider. Document Revised: 04/11/2017 Document Reviewed: 04/11/2017 Elsevier Patient Education  [place].   Gastrointestinal Bleeding Gastrointestinal (GI) bleeding is bleeding somewhere along the path that food travels through the body (digestive tract). This path  is anywhere between the mouth and the opening of the butt (anus). You may have blood in your poop (stool) or have black poop. If you throw up (vomit), there may be blood in it. This condition can be mild, serious, or even life-threatening. If you have a lot of bleeding, you may need to stay in the hospital. What are the causes? This condition may be caused by:  Irritation and swelling of the esophagus (esophagitis). The esophagus is part of the body that moves food from your mouth to your stomach.  Swollen veins in the butt (hemorrhoids).  Areas of painful tearing in the opening of the butt (anal fissures). These are often caused by passing hard poop.  Pouches that form on the colon over time (diverticulosis).  Irritation and swelling (diverticulitis) in areas where pouches have formed on the colon.  Growths (polyps) or cancer. Colon cancer often starts out as growths that are not cancer.  Irritation of the stomach lining (gastritis).  Sores (ulcers) in the stomach. What increases the risk? You are more likely to develop this condition if you:  Have a certain type of infection in your stomach (Helicobacter pylori infection).  Take certain medicines.  Smoke.  Drink alcohol. What are the signs or symptoms? Common symptoms of this condition include:  Throwing up (vomiting) material that has bright red blood in it. It may look like coffee grounds.  Changes in your poop. The poop may: ? Have red blood in it. ? Be black, look like tar, and smell stronger than normal. ? Be red.  Pain or cramping in the belly (abdomen). How is this treated? Treatment for this condition depends on the cause of the bleeding. For example:  Sometimes, the bleeding can be stopped during a procedure that is done to find the problem (endoscopy or colonoscopy).  Medicines can be used to: ? Help control irritation, swelling, or infection. ? Reduce acid in your stomach.  Certain problems can be  treated with: ? Creams. ? Medicines that are put in the butt (suppositories). ? Warm baths.  Surgery is sometimes needed.  If you lose a lot of blood, you may need a blood transfusion. If bleeding is mild, you may be allowed to go home. If there is a lot of bleeding, you will need to stay in the hospital. Follow these instructions at home:   Take over-the-counter and prescription medicines only as told by your doctor.  Eat foods that have a lot of fiber in them. These foods include beans, whole grains, and fresh fruits and vegetables. You can also try eating 1-3 prunes each day.  Drink enough fluid to keep your pee (urine) pale yellow.  Keep all follow-up visits as told by your doctor. This is  important. Contact a doctor if:  Your symptoms do not get better. Get help right away if:  Your bleeding does not stop.  You feel dizzy or you pass out (faint).  You feel weak.  You have very bad cramps in your back or belly.  You pass large clumps of blood (clots) in your poop.  Your symptoms are getting worse.  You have chest pain or fast heartbeats. Summary  GI bleeding is bleeding somewhere along the path that food travels through the body (digestive tract).  This bleeding can be caused by many things. Treatment depends on the cause of the bleeding.  Take medicines only as told by your doctor.  Keep all follow-up visits as told by your doctor. This is important. This information is not intended to replace advice given to you by your health care provider. Make sure you discuss any questions you have with your health care provider. Document Revised: 01/22/2018 Document Reviewed: 01/22/2018 Elsevier Patient Education  [place].

## 2019-10-15 ENCOUNTER — Telehealth: Payer: Self-pay | Admitting: Family Medicine

## 2019-10-15 ENCOUNTER — Emergency Department (HOSPITAL_COMMUNITY)
Admission: EM | Admit: 2019-10-15 | Discharge: 2019-10-15 | Disposition: A | Discharge status: Home / Self Care | Payer: Medicare Other | Attending: Emergency Medicine | Admitting: Emergency Medicine

## 2019-10-15 ENCOUNTER — Encounter (HOSPITAL_COMMUNITY): Payer: Self-pay

## 2019-10-15 ENCOUNTER — Other Ambulatory Visit: Payer: Self-pay

## 2019-10-15 DIAGNOSIS — I959 Hypotension, unspecified: Secondary | ICD-10-CM | POA: Insufficient documentation

## 2019-10-15 DIAGNOSIS — Z950 Presence of cardiac pacemaker: Secondary | ICD-10-CM | POA: Insufficient documentation

## 2019-10-15 DIAGNOSIS — Z79899 Other long term (current) drug therapy: Secondary | ICD-10-CM | POA: Diagnosis not present

## 2019-10-15 DIAGNOSIS — I5030 Unspecified diastolic (congestive) heart failure: Secondary | ICD-10-CM | POA: Insufficient documentation

## 2019-10-15 DIAGNOSIS — N183 Chronic kidney disease, stage 3 unspecified: Secondary | ICD-10-CM | POA: Diagnosis not present

## 2019-10-15 DIAGNOSIS — I130 Hypertensive heart and chronic kidney disease with heart failure and stage 1 through stage 4 chronic kidney disease, or unspecified chronic kidney disease: Secondary | ICD-10-CM | POA: Diagnosis not present

## 2019-10-15 DIAGNOSIS — J45909 Unspecified asthma, uncomplicated: Secondary | ICD-10-CM | POA: Diagnosis not present

## 2019-10-15 LAB — CBC
HCT: 27 % — ABNORMAL LOW (ref 36.0–46.0)
Hemoglobin: 8.5 g/dL — ABNORMAL LOW (ref 12.0–15.0)
MCH: 30.4 pg (ref 26.0–34.0)
MCHC: 31.5 g/dL (ref 30.0–36.0)
MCV: 96.4 fL (ref 80.0–100.0)
Platelets: 232 10*3/uL (ref 150–400)
RBC: 2.8 MIL/uL — ABNORMAL LOW (ref 3.87–5.11)
RDW: 13 % (ref 11.5–15.5)
WBC: 9.7 10*3/uL (ref 4.0–10.5)
nRBC: 0 % (ref 0.0–0.2)

## 2019-10-15 LAB — TYPE AND SCREEN
ABO/RH(D): A POS
Antibody Screen: NEGATIVE

## 2019-10-15 LAB — COMPREHENSIVE METABOLIC PANEL
ALT: 24 U/L (ref 0–44)
AST: 26 U/L (ref 15–41)
Albumin: 2.8 g/dL — ABNORMAL LOW (ref 3.5–5.0)
Alkaline Phosphatase: 61 U/L (ref 38–126)
Anion gap: 8 (ref 5–15)
BUN: 24 mg/dL — ABNORMAL HIGH (ref 8–23)
CO2: 28 mmol/L (ref 22–32)
Calcium: 8.3 mg/dL — ABNORMAL LOW (ref 8.9–10.3)
Chloride: 94 mmol/L — ABNORMAL LOW (ref 98–111)
Creatinine, Ser: 1.68 mg/dL — ABNORMAL HIGH (ref 0.44–1.00)
GFR calc Af Amer: 31 mL/min — ABNORMAL LOW (ref >60)
GFR calc non Af Amer: 26 mL/min — ABNORMAL LOW (ref >60)
Glucose, Bld: 115 mg/dL — ABNORMAL HIGH (ref 70–99)
Potassium: 3.7 mmol/L (ref 3.5–5.1)
Sodium: 130 mmol/L — ABNORMAL LOW (ref 135–145)
Total Bilirubin: 0.6 mg/dL (ref 0.3–1.2)
Total Protein: 5.5 g/dL — ABNORMAL LOW (ref 6.5–8.1)

## 2019-10-15 NOTE — ED Notes (Signed)
Patient verbalizes understanding of discharge instructions. Opportunity for questioning and answers were provided. Armband removed by staff, pt discharged from ED via wheelchair with daughter to go home.

## 2019-10-15 NOTE — ED Triage Notes (Signed)
Pt arrives to ED w/ c/o hypotension. Pt recently admitted to [facility] where she had a gi bleed and needed a blood transfusion. Pt denies dizziness, lightheadedness. Family states they have not noticed any bleeding.

## 2019-10-15 NOTE — Discharge Instructions (Addendum)
The low blood pressure has improved spontaneously.  For now, stop taking the Lasix, until Sunday then resume by taking 20 mg (one half of the 40 mg tablet) on Sunday.  See your doctor on Monday and get further instructions for resumption of the Lasix.  Your hemoglobin today is 8.5 mg.  This is essentially where it was when you left the hospital.  Continue taking your current medications.

## 2019-10-15 NOTE — ED Provider Notes (Addendum)
[facility] EMERGENCY DEPARTMENT Provider Note   CSN: [id number] Arrival date & time: 10/15/19  1456     History Chief Complaint  Patient presents with  . Hypotension    Crystal Cooper is a 86 y.o. female.  HPI Patient here for evaluation of low blood pressure.  Her daughter called the PCP to report blood pressure 86/45, and was directed to bring the patient to the emergency department.  She was hospitalized 09/30/19 and discharged 10/13/2019.  During hospitalization she was treated for hypertension, paroxysmal atrial fibrillation, iron deficiency anemia, GI bleed, chronic kidney disease, and long-term use of anticoagulants.  Early in the month she was treated for bronchitis with antibiotics.  During the course of this evaluation it was discovered that she had a GI bleed.  She was treated with blood transfusion.  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  Anticoagulation held at discharge.  Patient with daughter, who gives history,and states that patient has not been ill with cough, fever, vomiting, rectal bleeding, dizziness.   Past Medical History:  Diagnosis Date  . Anemia    years ago  . Aortic stenosis   . Arthritis   . Asthma   . Atrial fibrillation ([place])   . CHF (congestive heart failure) ([place]) 11/2014  . Family history of adverse reaction to anesthesia    2 daughters would have N/V  . Glaucoma   . Hearing loss   . Heart murmur   . HTN (hypertension)   . Pneumonia   . Prolapsing mitral leaflet syndrome   . Shortness of breath dyspnea    with exertion  . SVT (supraventricular tachycardia) (H[facility])    S/P ablation of AVNRT in 2003    Patient Active Problem List   Diagnosis Date Noted  . Long term (current) use of anticoagulants   . AKI (acute kidney injury) ([place])   . Wheezing 09/23/2019  . Abnormal breath sounds 09/09/2019  . Heme positive stool   . Acute CVA (cerebrovascular accident) ([place]) 08/27/2019  . PNA (pneumonia)  08/25/2019  . Hypokalemia 08/23/2019  . Angio-edema, initial encounter 08/23/2019  . Reactive airway disease 03/24/2019  . GERD without esophagitis 03/24/2019  . CKD (chronic kidney disease), stage III 11/12/2018  . GI bleed 05/12/2018  . 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 Diastolic dysfunction 12/16/7626  . Pacemaker 05/12/2018  . Iron deficiency anemia   . Melena   . Duodenal arteriovenous malformation   . Gastritis and gastroduodenitis   . Normocytic anemia 04/06/2018  . Osteoporosis 08/27/2016  . ASCVD (arteriosclerotic cardiovascular disease) 01/11/2016  . Asthma 01/11/2016  . Glaucoma 01/11/2016  . Hearing loss 01/11/2016  . Other nonrheumatic mitral valve disorders 01/11/2016  . Junctional bradycardia   . PAF (paroxysmal atrial fibrillation) ([place]) 09/06/2015  . Hyponatremia 12/17/2014  . Congestive heart failure ([place]) 12/12/2014  . Murmur 10/19/2011  . Chest pain 10/19/2011  . Essential hypertension 10/19/2011  . History of cardiac radiofrequency ablation (RFA) 10/19/2011    Past Surgical History:  Procedure Laterality Date  . APPENDECTOMY    . BIOPSY  04/07/2018   Procedure: BIOPSY;  Surgeon: Jerene Bears, MD;  Location: [facility] ENDOSCOPY;  Service: Gastroenterology;;  . BLADDER SURGERY    . CARDIAC CATHETERIZATION N/A 09/26/2015   Procedure: Right/Left Heart Cath and Coronary Angiography;  Surgeon: Burnell Blanks, MD;  Location: [place] CV LAB;  Service: Cardiovascular;  Laterality: N/A;  . CARDIAC SURGERY    . CARDIOVERSION  N/A 03/02/2016   Procedure: CARDIOVERSION;  Surgeon: Thayer Headings, MD;  Location: [facility];  Service: Cardiovascular;  Laterality: N/A;  . EP IMPLANTABLE DEVICE N/A 10/26/2015   Procedure: Pacemaker Implant;  Surgeon: Will Meredith Leeds, MD;  Location: [place] CV LAB;  Service: Cardiovascular;  Laterality: N/A;  . ESOPHAGOGASTRODUODENOSCOPY (EGD) WITH PROPOFOL N/A 04/07/2018   Procedure:  ESOPHAGOGASTRODUODENOSCOPY (EGD) WITH PROPOFOL;  Surgeon: Jerene Bears, MD;  Location: [facility] ENDOSCOPY;  Service: Gastroenterology;  Laterality: N/A;  . ESOPHAGOGASTRODUODENOSCOPY (EGD) WITH PROPOFOL N/A 10/12/2019   Procedure: ESOPHAGOGASTRODUODENOSCOPY (EGD) WITH PROPOFOL;  Surgeon: Danie Binder, MD;  Location: [facility] ENDO SUITE;  Service: Endoscopy;  Laterality: N/A;  . EYE SURGERY Bilateral    cataract surgery  . HOT HEMOSTASIS N/A 04/07/2018   Procedure: HOT HEMOSTASIS (ARGON PLASMA COAGULATION/BICAP);  Surgeon: Jerene Bears, MD;  Location: [facility] ENDOSCOPY;  Service: Gastroenterology;  Laterality: N/A;  . HOT HEMOSTASIS  10/12/2019   Procedure: HOT HEMOSTASIS (ARGON PLASMA COAGULATION/BICAP);  Surgeon: Danie Binder, MD;  Location: [facility] ENDO SUITE;  Service: Endoscopy;;  . NASAL SINUS SURGERY    . TEE WITHOUT CARDIOVERSION N/A 10/25/2015   Procedure: TRANSESOPHAGEAL ECHOCARDIOGRAM (TEE);  Surgeon: Burnell Blanks, MD;  Location: [facility];  Service: Open Heart Surgery;  Laterality: N/A;  . TRANSCATHETER AORTIC VALVE REPLACEMENT, TRANSFEMORAL Right 10/25/2015   Procedure: TRANSCATHETER AORTIC VALVE REPLACEMENT, TRANSFEMORAL;  Surgeon: Burnell Blanks, MD;  Location: [facility];  Service: Open Heart Surgery;  Laterality: Right;     OB History    Gravida  3   Para  3   Term  3   Preterm      AB      Living  2     SAB      TAB      Ectopic      Multiple      Live Births              Family History  Problem Relation Age of Onset  . Hypertension Mother   . Pneumonia Father   . Heart attack Neg Hx   . Colon cancer Neg Hx   . Esophageal cancer Neg Hx     Social History   Tobacco Use  . Smoking status: Never Smoker  . Smokeless tobacco: Never Used  Substance Use Topics  . Alcohol use: No    Alcohol/week: 0.0 standard drinks  . Drug use: No    Home Medications Prior to Admission medications   Medication Sig Start Date End Date Taking? Authorizing Provider    acetaminophen (TYLENOL) 500 MG tablet Take 500 mg by mouth every 6 (six) hours as needed for mild pain or headache.    Yes [provider]  albuterol (PROAIR HFA) 108 (90 Base) MCG/ACT inhaler Inhale 2 puffs into the lungs every 4 (four) hours as needed for wheezing or shortness of breath. 08/05/19  Yes RakesConnye Burkitt, FNP  Biotin 10 MG CAPS Take 10 mg by mouth daily.    Yes [provider]  Calcium Carb-Cholecalciferol (CALTRATE 600+D3) 600-800 MG-UNIT TABS Take 1 tablet by mouth daily.   Yes [provider]  docusate sodium (COLACE) 100 MG capsule Take 100 mg by mouth daily as needed for mild constipation.   Yes [provider]  famotidine (PEPCID) 20 MG tablet Take 1 tablet (20 mg total) by mouth at bedtime. 10/13/19 10/12/20 Yes Barton Dubois, MD  ferrous sulfate 325 (65 FE) MG tablet Take 1 tablet (  325 mg total) by mouth 2 (two) times daily with a meal. 04/08/18  Yes Vann, Jessica U, DO  fluticasone furoate-vilanterol (BREO ELLIPTA) 100-25 MCG/INH AEPB Inhale 1 puff into the lungs daily. 09/23/19  Yes Rakes, Connye Burkitt, FNP  furosemide (LASIX) 40 MG tablet Take 1.5 tablets (60 mg total) by mouth daily. 03/24/19  Yes Rakes, Connye Burkitt, FNP  metoprolol tartrate (LOPRESSOR) 25 MG tablet Take 1 tablet (25 mg total) by mouth 2 (two) times daily. 05/20/19  Yes Camnitz, Will Hassell Done, MD  multivitamin-iron-minerals-folic acid (CENTRUM) chewable tablet Chew 1 tablet by mouth daily.   Yes [provider]  OVER THE COUNTER MEDICATION Take 0.5 tablets by mouth daily as needed (for allergy). Patient not sure about the name.   Yes [provider]  pantoprazole (PROTONIX) 40 MG tablet Take 1 tablet (40 mg total) by mouth 2 (two) times daily before a meal. 08/30/19 10/29/19 Yes Amin, Jeanella Flattery, MD  apixaban (ELIQUIS) 2.5 MG TABS tablet Take 1 tablet (2.5 mg total) by mouth 2 (two) times daily. HOLD UNTIL FOLLOW UP WITH PCP. Patient not taking: Reported on 10/15/2019  10/13/19   Barton Dubois, MD    Allergies    Hctz [hydrochlorothiazide], Aspirin, and Codeine  Review of Systems   Review of Systems  All other systems reviewed and are negative.   Physical Exam Updated Vital Signs BP (!) 131/55 (BP Location: Left Arm)   Pulse (!) 54   Temp 98.2 F (36.8 C) (Oral)   Resp (!) 25   SpO2 99%   Physical Exam Vitals and nursing note reviewed.  Constitutional:      General: She is not in acute distress.    Appearance: She is well-developed. She is not ill-appearing or toxic-appearing.     Comments: Elderly, frail  HENT:     Head: Normocephalic and atraumatic.     Right Ear: External ear normal.     Left Ear: External ear normal.  Eyes:     Conjunctiva/sclera: Conjunctivae normal.     Pupils: Pupils are equal, round, and reactive to light.  Neck:     Trachea: Phonation normal.  Cardiovascular:     Rate and Rhythm: Normal rate and regular rhythm.     Heart sounds: Normal heart sounds.  Pulmonary:     Effort: Pulmonary effort is normal.     Breath sounds: Normal breath sounds.  Abdominal:     General: There is no distension.     Palpations: Abdomen is soft.  Musculoskeletal:        General: No tenderness. Normal range of motion.     Cervical back: Normal range of motion and neck supple.  Skin:    General: Skin is warm and dry.     Coloration: Skin is not jaundiced.  Neurological:     Mental Status: She is alert and oriented to person, place, and time.     Cranial Nerves: No cranial nerve deficit.     Sensory: No sensory deficit.     Motor: No abnormal muscle tone.     Coordination: Coordination normal.  Psychiatric:        Mood and Affect: Mood normal.        Behavior: Behavior normal.     ED Results / Procedures / Treatments   Labs (all labs ordered are listed, but only abnormal results are displayed) Labs Reviewed  COMPREHENSIVE METABOLIC PANEL - Abnormal; Notable for the following components:      Result Value  Sodium  130 (*)    Chloride 94 (*)    Glucose, Bld 115 (*)    BUN 24 (*)    Creatinine, Ser 1.68 (*)    Calcium 8.3 (*)    Total Protein 5.5 (*)    Albumin 2.8 (*)    GFR calc non Af Amer 26 (*)    GFR calc Af Amer 31 (*)    All other components within normal limits  CBC - Abnormal; Notable for the following components:   RBC 2.80 (*)    Hemoglobin 8.5 (*)    HCT 27.0 (*)    All other components within normal limits  POC OCCULT BLOOD, ED  TYPE AND SCREEN    EKG None  Radiology No results found.  Procedures Procedures (including critical care time)  Medications Ordered in ED Medications - No data to display  ED Course  I have reviewed the triage vital signs and the nursing notes.  Pertinent labs & imaging results that were available during my care of the patient were reviewed by me and considered in my medical decision making (see chart for details).  Clinical Course as of Oct 15 2150  Thu Oct 15, 2019  2135 Normal except hemoglobin low  CBC(!) [EW]  2136 Normal except sodium low, chloride low, glucose high, BUN high, creatinine high, calcium low, total protein low, albumin low, GFR low  Comprehensive metabolic panel(!) [EW]    Clinical Course User Index [EW] Daleen Bo, MD   MDM Rules/Calculators/A&P                       Patient Vitals for the past 24 hrs:  BP Temp Temp src Pulse Resp SpO2  10/15/19 2138 (!) 131/55 -- -- -- (!) 25 99 %  10/15/19 1915 (!) 137/53 -- -- (!) 54 (!) 21 99 %  10/15/19 1900 (!) 115/55 -- -- (!) 57 (!) 23 100 %  10/15/19 1857 (!) 126/58 -- -- 62 20 99 %  10/15/19 1742 112/65 -- -- 80 16 100 %  10/15/19 1504 (!) 108/44 -- -- -- -- --  10/15/19 1501 (!) 101/58 98.2 F (36.8 C) Oral 69 14 97 %    9:37 PM Reevaluation with update and discussion. After initial assessment and treatment, an updated evaluation reveals she is stable has no further complaints.  Findings discussed with patient and daughter, all questions answered. Daleen Bo   Medical Decision Making:  This patient is presenting for evaluation of an episode of low blood pressure, which does require a range of treatment options, and is a complaint that involves a moderate risk of morbidity and mortality. The differential diagnoses include hypovolemia, rectal bleeding, hemodynamic instability. I decided  to review old records, and in summary elderly patient, recently hospitalized for GI bleeding, due to gastritis.  No additional bleeding noted, episode of low blood pressure today.  Her blood pressure medicines have been curtailed recently but she still takes Lasix. I obtained additional historical information from her daughter at the bedside. Clinical Laboratory Tests Ordered, included CBC, chemistry panel,.  Reviewed, stable hemoglobin, mild chronic renal insufficiency.  Critical Interventions-clinical evaluation, laboratory testing, observation  After These Interventions, the Patient was reevaluated and was found stable for discharge.  Episode of low blood pressure likely volume related.  No indication for hospitalization or further ED treatment  CRITICAL CARE-no Performed by: Daleen Bo  Nursing Notes Reviewed/ Care Coordinated Applicable Imaging Reviewed Interpretation of Laboratory Data incorporated into  ED treatment  The patient appears reasonably screened and/or stabilized for discharge and I doubt any other medical condition or other [facility] requiring further screening, evaluation, or treatment in the ED at this time prior to discharge.  Plan: Home Medications-continue all medicines except Lasix, hold for 2 days then resume at 20 mg a day.  Follow-up with PCP as scheduled in 4 days.; Home Treatments-regular diet; return here if the recommended treatment, does not improve the symptoms; Recommended follow up-PCP, 4 days  Final Clinical Impression(s) / ED Diagnoses Final diagnoses:  Hypotension, unspecified hypotension type    Rx / DC Orders ED  Discharge Orders    None       Daleen Bo, MD 10/15/19 2152    Daleen Bo, MD 10/26/19 [phone]

## 2019-10-15 NOTE — Telephone Encounter (Signed)
BP @ 1:12 pm 86/45, heart rate 60 per daughter.   Advised to take patient to ER per advice of Dr Livia Snellen.   Daughter agrees.

## 2019-10-16 DIAGNOSIS — I509 Heart failure, unspecified: Secondary | ICD-10-CM | POA: Diagnosis not present

## 2019-10-16 DIAGNOSIS — J449 Chronic obstructive pulmonary disease, unspecified: Secondary | ICD-10-CM | POA: Diagnosis not present

## 2019-10-16 DIAGNOSIS — I130 Hypertensive heart and chronic kidney disease with heart failure and stage 1 through stage 4 chronic kidney disease, or unspecified chronic kidney disease: Secondary | ICD-10-CM | POA: Diagnosis not present

## 2019-10-16 DIAGNOSIS — I69351 Hemiplegia and hemiparesis following cerebral infarction affecting right dominant side: Secondary | ICD-10-CM | POA: Diagnosis not present

## 2019-10-16 DIAGNOSIS — N183 Chronic kidney disease, stage 3 unspecified: Secondary | ICD-10-CM | POA: Diagnosis not present

## 2019-10-16 DIAGNOSIS — I2510 Atherosclerotic heart disease of native coronary artery without angina pectoris: Secondary | ICD-10-CM | POA: Diagnosis not present

## 2019-10-19 ENCOUNTER — Ambulatory Visit (INDEPENDENT_AMBULATORY_CARE_PROVIDER_SITE_OTHER): Payer: Medicare Other | Admitting: Family

## 2019-10-19 ENCOUNTER — Telehealth: Payer: Self-pay | Admitting: *Deleted

## 2019-10-19 ENCOUNTER — Other Ambulatory Visit: Payer: Self-pay

## 2019-10-19 ENCOUNTER — Ambulatory Visit (INDEPENDENT_AMBULATORY_CARE_PROVIDER_SITE_OTHER): Payer: Medicare Other

## 2019-10-19 ENCOUNTER — Encounter: Payer: Self-pay | Admitting: Family

## 2019-10-19 VITALS — BP 120/64 | HR 63 | Temp 97.6°F | Ht 62.0 in | Wt 113.6 lb

## 2019-10-19 DIAGNOSIS — D500 Iron deficiency anemia secondary to blood loss (chronic): Secondary | ICD-10-CM | POA: Diagnosis not present

## 2019-10-19 DIAGNOSIS — J449 Chronic obstructive pulmonary disease, unspecified: Secondary | ICD-10-CM | POA: Diagnosis not present

## 2019-10-19 DIAGNOSIS — R062 Wheezing: Secondary | ICD-10-CM

## 2019-10-19 DIAGNOSIS — I509 Heart failure, unspecified: Secondary | ICD-10-CM | POA: Diagnosis not present

## 2019-10-19 DIAGNOSIS — R410 Disorientation, unspecified: Secondary | ICD-10-CM

## 2019-10-19 DIAGNOSIS — I2510 Atherosclerotic heart disease of native coronary artery without angina pectoris: Secondary | ICD-10-CM | POA: Diagnosis not present

## 2019-10-19 DIAGNOSIS — I480 Paroxysmal atrial fibrillation: Secondary | ICD-10-CM | POA: Diagnosis not present

## 2019-10-19 DIAGNOSIS — I130 Hypertensive heart and chronic kidney disease with heart failure and stage 1 through stage 4 chronic kidney disease, or unspecified chronic kidney disease: Secondary | ICD-10-CM | POA: Diagnosis not present

## 2019-10-19 DIAGNOSIS — I69351 Hemiplegia and hemiparesis following cerebral infarction affecting right dominant side: Secondary | ICD-10-CM | POA: Diagnosis not present

## 2019-10-19 DIAGNOSIS — I5032 Chronic diastolic (congestive) heart failure: Secondary | ICD-10-CM

## 2019-10-19 DIAGNOSIS — J929 Pleural plaque without asbestos: Secondary | ICD-10-CM | POA: Diagnosis not present

## 2019-10-19 DIAGNOSIS — R011 Cardiac murmur, unspecified: Secondary | ICD-10-CM | POA: Diagnosis not present

## 2019-10-19 DIAGNOSIS — N183 Chronic kidney disease, stage 3 unspecified: Secondary | ICD-10-CM | POA: Diagnosis not present

## 2019-10-19 LAB — URINALYSIS, COMPLETE
Bilirubin, UA: NEGATIVE
Glucose, UA: NEGATIVE
Ketones, UA: NEGATIVE
Nitrite, UA: NEGATIVE
Protein,UA: NEGATIVE
Specific Gravity, UA: 1.01 (ref 1.005–1.030)
Urobilinogen, Ur: 0.2 mg/dL (ref 0.2–1.0)
pH, UA: 5.5 (ref 5.0–7.5)

## 2019-10-19 LAB — MICROSCOPIC EXAMINATION
Bacteria, UA: NONE SEEN
Renal Epithel, UA: NONE SEEN /hpf

## 2019-10-19 MED ORDER — FUROSEMIDE 20 MG PO TABS: 20.0000 mg | ORAL_TABLET | Freq: Every day | ORAL | 11 refills | Status: DC

## 2019-10-19 NOTE — Telephone Encounter (Signed)
Continue to hold this given GI bleed and risk for bleed.

## 2019-10-19 NOTE — Patient Instructions (Signed)
Confusion °Confusion is the inability to think with the usual speed or clarity. People who are confused often describe their thinking as cloudy or unclear. Confusion can also include feeling disoriented. This means you are unaware of where you are or who you are. You may also not know the date or time. When confused, you may have difficulty remembering, paying attention, or making decisions. Some people also act aggressively when they are confused. °In some cases, confusion may come on quickly. In other cases, it may develop slowly over time. How quickly confusion comes on depends on the cause. °Confusion may be caused by: °· Head injury (concussion). °· Seizures. °· Stroke. °· Fever. °· Brain tumor. °· Decrease in brain function due to a vascular or neurologic condition (dementia). °· Emotions, like rage or terror. °· Inability to know what is real and what is not (hallucinations). °· Infections, such as a urinary tract infection (UTI). °· Using too much alcohol, drugs, or medicines. °· Loss of fluid (dehydration) or an imbalance of salts in the body (electrolytes). °· Lack of sleep. °· Low blood sugar (diabetes). °· Low levels of oxygen. This comes from conditions such as chronic lung disorders. °· Side effects of medicines, or taking medicines that affect other medicines (drug interactions). °· Lack of certain nutrients, especially niacin, thiamine, vitamin C, or vitamin B. °· Sudden drop in body temperature (hypothermia). °· Change in routine, such as traveling or being hospitalized. °Follow these instructions at home: °Pay attention to your symptoms. Tell your health care provider about any changes or if you develop new symptoms. Follow these instructions to control or treat symptoms. Ask a family member or friend for help if needed. °Medicines °· Take over-the-counter and prescription medicines only as told by your health care provider. °· Ask your health care provider about changing or stopping any medicines  that may be causing your confusion. °· Avoid pain medicines or sleep medicines until you have fully recovered. °· Use a pillbox or an alarm to help you take the right medicines at the right time. °Lifestyle ° °· Eat a balanced diet that includes fruits and vegetables. °· Get enough sleep. For most adults, this is 7-9 hours each night. °· Do not drink alcohol. °· Do not become isolated. Spend time with other people and make plans for your days. °· Do not drive until your health care provider says that it is safe to do so. °· Do not use any products that contain nicotine or tobacco, such as cigarettes and e-cigarettes. If you need help quitting, ask your health care provider. °· Stop other activities that may increase your chances of getting hurt. These may include some work duties, sports activities, swimming, or bike riding. Ask your health care provider what activities are safe for you. °What caregivers can do °· Find out if the person is confused. Ask the person to state his or her name, age, and the date. If the person is unsure or answers incorrectly, he or she may be confused. °· Always introduce yourself, no matter how well the person knows you. °· Remind the person of his or her location. Do this often. °· Place a calendar and clock near the person who is confused. °· Talk about current events and plans for the day. °· Keep the environment calm, quiet, and peaceful. °· Help the person do the things that he or she is unable to do. These include: °? Taking medicines. °? Keeping follow-up visits with his or her health care   provider. °? Helping with household duties, including meal preparation. °? Running errands. °· Get help if you need it. There are several support groups for caregivers. °· If the person you are helping needs more support, consider day care, extended care programs, or a skilled nursing facility. The person's health care provider may be able to help evaluate these options. °General  instructions °· Monitor yourself for any conditions you may have. These may include: °? Checking your blood glucose levels, if you have diabetes. °? Watching your weight, if you are overweight. °? Monitoring your blood pressure, if you have hypertension. °? Monitoring your body temperature, if you have a fever. °· Keep all follow-up visits as told by your health care provider. This is important. °Contact a health care provider if: °· Your symptoms get worse. °Get help right away if you: °· Feel that you are not able to care for yourself. °· Develop severe headaches, repeated vomiting, seizures, blackouts, or slurred speech. °· Have increasing confusion, weakness, numbness, restlessness, or personality changes. °· Develop a loss of balance, have marked dizziness, feel uncoordinated, or fall. °· Develop severe anxiety, or you have delusions or hallucinations. °These symptoms may represent a serious problem that is an emergency. Do not wait to see if the symptoms will go away. Get medical help right away. Call your local emergency services (911 in the [place] Do not drive yourself to the hospital. °Summary °· Confusion is the inability to think with the usual speed or clarity. People who are confused often describe their thinking as cloudy or unclear. °· Confusion can also include having difficulty remembering, paying attention, or making decisions. °· Confusion may come on quickly or develop slowly over time, depending on the cause. There are many different causes of confusion. °· Ask for help from family members or friends if you are unable to take care of yourself. °This information is not intended to replace advice given to you by your health care provider. Make sure you discuss any questions you have with your health care provider. °Document Revised: 06/13/2017 Document Reviewed: 06/13/2017 °Elsevier Patient Education © 2020 [employer] ° °

## 2019-10-19 NOTE — Progress Notes (Signed)
Subjective:    Patient ID: Crystal Cooper, female    DOB: [birth year], 86 y.o.   MRN: [id number]  Chief Complaint  Patient presents with  . Hospitalization Follow-up    4/17 & 4/22 - [facility] - hypotension  . Altered Mental Status    Daughter states that she has been confused since she left the hospital.    HPI Today's visit is for Transitional Care Management.  The patient was discharged from [facility] on 10/13/19 with a primary diagnosis of Anemia, GI bleed, and hypotension.   Contact with the patient and/or caregiver, by a clinical staff member, was made on 10/13/19 and was documented as a telephone encounter within the EMR.  Through chart review and discussion with the patient I have determined that management of their condition is of moderate complexity.    She was hospitalized on on 09/30/19 and discharged on 07/15/19 with A Fib, Iron deficiency anemia, GI bleed, and CKD. She had an EGD for the GI bleed. Her Eliquis was held. She then went back to the ED on 10/15/19 with hypotension.  She was told to hold Lasix 40 mg for two days then resume 20 mg after the 2 days.   Daughter states she has been confused at home and her urine has had a foul odor that they noticed this AM.  Denies any weight gain or edema.    Review of Systems  Constitutional: Positive for fatigue.  Psychiatric/Behavioral: Positive for confusion.  All other systems reviewed and are negative.      Objective:   Physical Exam Vitals reviewed.  Constitutional:      General: She is not in acute distress.    Appearance: She is well-developed.  HENT:     Head: Normocephalic and atraumatic.  Eyes:     Pupils: Pupils are equal, round, and reactive to light.  Neck:     Thyroid: No thyromegaly.  Cardiovascular:     Rate and Rhythm: Normal rate and regular rhythm.     Heart sounds: Normal heart sounds. No murmur.  Pulmonary:     Effort: Pulmonary effort is normal. No respiratory distress.     Breath sounds:  Wheezing present.  Abdominal:     General: Bowel sounds are normal. There is no distension.     Palpations: Abdomen is soft.     Tenderness: There is no abdominal tenderness.  Musculoskeletal:        General: No tenderness. Normal range of motion.     Cervical back: Normal range of motion and neck supple.     Right lower leg: Edema (trace) present.     Left lower leg: Edema (trace) present.  Skin:    General: Skin is warm and dry.  Neurological:     Mental Status: She is alert and oriented to person, place, and time.     Cranial Nerves: No cranial nerve deficit.     Deep Tendon Reflexes: Reflexes are normal and symmetric.  Psychiatric:        Behavior: Behavior normal.        Thought Content: Thought content normal.        Judgment: Judgment normal.         BP 120/64   Pulse 63   Temp 97.6 F (36.4 C) (Temporal)   Ht 5' 2" (1.575 m)   Wt 113 lb 9.6 oz (51.5 kg)   SpO2 100%   BMI 20.78 kg/m   Assessment & Plan:  [place]leigh W Blow  comes in today with chief complaint of Hospitalization Follow-up (4/17 & 4/22 - [facility] - hypotension) and Altered Mental Status (Daughter states that she has been confused since she left the hospital.)   Diagnosis and orders addressed:  1. Confusion - Urinalysis, Complete - CMP14+EGFR - CBC with Differential/Platelet  2. Chronic diastolic congestive heart failure (HCC) Continue Lasix 20 mg daily Compression hose Elevate feet - CMP14+EGFR - CBC with Differential/Platelet - furosemide (LASIX) 20 MG tablet; Take 1 tablet (20 mg total) by mouth daily.  Dispense: 30 tablet; Refill: 11  3. PAF (paroxysmal atrial fibrillation) (HCC) - CMP14+EGFR - CBC with Differential/Platelet  4. Iron deficiency anemia due to chronic blood loss - CMP14+EGFR - CBC with Differential/Platelet  5. Murmur - CMP14+EGFR - CBC with Differential/Platelet  6. Wheezing - DG Chest 2 View; Future - CMP14+EGFR - CBC with Differential/Platelet   Labs pending  Diet and exercise encouraged  Follow up plan: Keep follow up with PCP   Evelina Dun, FNP

## 2019-10-19 NOTE — Telephone Encounter (Signed)
Pt's daughter calling to see if the pt needed to go back on Eliquis or what she should do as it had been held while in the hospital. Please advise?

## 2019-10-20 ENCOUNTER — Telehealth: Payer: Self-pay | Admitting: *Deleted

## 2019-10-20 DIAGNOSIS — I2510 Atherosclerotic heart disease of native coronary artery without angina pectoris: Secondary | ICD-10-CM | POA: Diagnosis not present

## 2019-10-20 DIAGNOSIS — I509 Heart failure, unspecified: Secondary | ICD-10-CM | POA: Diagnosis not present

## 2019-10-20 DIAGNOSIS — I69351 Hemiplegia and hemiparesis following cerebral infarction affecting right dominant side: Secondary | ICD-10-CM | POA: Diagnosis not present

## 2019-10-20 DIAGNOSIS — I130 Hypertensive heart and chronic kidney disease with heart failure and stage 1 through stage 4 chronic kidney disease, or unspecified chronic kidney disease: Secondary | ICD-10-CM | POA: Diagnosis not present

## 2019-10-20 DIAGNOSIS — N183 Chronic kidney disease, stage 3 unspecified: Secondary | ICD-10-CM | POA: Diagnosis not present

## 2019-10-20 DIAGNOSIS — J449 Chronic obstructive pulmonary disease, unspecified: Secondary | ICD-10-CM | POA: Diagnosis not present

## 2019-10-20 LAB — CMP14+EGFR
ALT: 22 IU/L (ref 0–32)
AST: 30 IU/L (ref 0–40)
Albumin/Globulin Ratio: 1.4 (ref 1.2–2.2)
Albumin: 3.7 g/dL (ref 3.5–4.6)
Alkaline Phosphatase: 86 IU/L (ref 39–117)
BUN/Creatinine Ratio: 14 (ref 12–28)
BUN: 19 mg/dL (ref 10–36)
Bilirubin Total: 0.3 mg/dL (ref 0.0–1.2)
CO2: 25 mmol/L (ref 20–29)
Calcium: 9 mg/dL (ref 8.7–10.3)
Chloride: 97 mmol/L (ref 96–106)
Creatinine, Ser: 1.38 mg/dL — ABNORMAL HIGH (ref 0.57–1.00)
GFR calc Af Amer: 39 mL/min/{1.73_m2} — ABNORMAL LOW (ref >59)
GFR calc non Af Amer: 34 mL/min/{1.73_m2} — ABNORMAL LOW (ref >59)
Globulin, Total: 2.6 g/dL (ref 1.5–4.5)
Glucose: 95 mg/dL (ref 65–99)
Potassium: 4.4 mmol/L (ref 3.5–5.2)
Sodium: 136 mmol/L (ref 134–144)
Total Protein: 6.3 g/dL (ref 6.0–8.5)

## 2019-10-20 LAB — CBC WITH DIFFERENTIAL/PLATELET
Basophils Absolute: 0.1 10*3/uL (ref 0.0–0.2)
Basos: 1 %
EOS (ABSOLUTE): 0.2 10*3/uL (ref 0.0–0.4)
Eos: 2 %
Hematocrit: 27.8 % — ABNORMAL LOW (ref 34.0–46.6)
Hemoglobin: 9.1 g/dL — ABNORMAL LOW (ref 11.1–15.9)
Immature Grans (Abs): 0.1 10*3/uL (ref 0.0–0.1)
Immature Granulocytes: 1 %
Lymphocytes Absolute: 1.2 10*3/uL (ref 0.7–3.1)
Lymphs: 16 %
MCH: 31.5 pg (ref 26.6–33.0)
MCHC: 32.7 g/dL (ref 31.5–35.7)
MCV: 96 fL (ref 79–97)
Monocytes Absolute: 0.7 10*3/uL (ref 0.1–0.9)
Monocytes: 10 %
Neutrophils Absolute: 5.3 10*3/uL (ref 1.4–7.0)
Neutrophils: 70 %
Platelets: 275 10*3/uL (ref 150–450)
RBC: 2.89 x10E6/uL — ABNORMAL LOW (ref 3.77–5.28)
RDW: 12.5 % (ref 11.7–15.4)
WBC: 7.5 10*3/uL (ref 3.4–10.8)

## 2019-10-20 NOTE — Telephone Encounter (Signed)
FYI: VM from [place] w/ [place] has a fall in the kitchen this morning Has a skin tear on right hand Was advised to put neosporin and bandaid on it

## 2019-10-20 NOTE — Telephone Encounter (Signed)
Can you check and see if sh is due a tetanus shot. Otherwise, neosporin angauze is all she needs to use. Watch fro infection.

## 2019-10-20 NOTE — Telephone Encounter (Signed)
Daughter aware.

## 2019-10-20 NOTE — Telephone Encounter (Signed)
Home care aware of provider's advice.  TDAP done in 2018.

## 2019-10-20 NOTE — Telephone Encounter (Signed)
Left message , for daughter,  to please call our office to confirm message from provider.

## 2019-10-22 ENCOUNTER — Other Ambulatory Visit: Payer: Self-pay | Admitting: Family

## 2019-10-22 ENCOUNTER — Telehealth: Payer: Self-pay | Admitting: Family Medicine

## 2019-10-22 ENCOUNTER — Other Ambulatory Visit: Payer: Self-pay

## 2019-10-22 DIAGNOSIS — Z87440 Personal history of urinary (tract) infections: Secondary | ICD-10-CM

## 2019-10-22 LAB — URINE CULTURE

## 2019-10-22 MED ORDER — AMOXICILLIN 875 MG PO TABS: 875.0000 mg | ORAL_TABLET | Freq: Two times a day (BID) | ORAL | 0 refills | Status: DC

## 2019-10-22 NOTE — Telephone Encounter (Signed)
Pts daughter called stating that she would like to have patients urine rechecked for UTI because her urine has a bad smell and patients acting normal. Wants to know if patient needs another appt for this or can urine just be retested since pt just had an appt on 10/19/19.

## 2019-10-22 NOTE — Telephone Encounter (Signed)
It's okay with me, but results will need to go to the on call person. Might want to ask her

## 2019-10-22 NOTE — Telephone Encounter (Signed)
Left detailed message on voicemail to drop off urine at their convenience.

## 2019-10-22 NOTE — Telephone Encounter (Signed)
Please review and advise.

## 2019-10-23 DIAGNOSIS — I509 Heart failure, unspecified: Secondary | ICD-10-CM | POA: Diagnosis not present

## 2019-10-23 DIAGNOSIS — N183 Chronic kidney disease, stage 3 unspecified: Secondary | ICD-10-CM | POA: Diagnosis not present

## 2019-10-23 DIAGNOSIS — I130 Hypertensive heart and chronic kidney disease with heart failure and stage 1 through stage 4 chronic kidney disease, or unspecified chronic kidney disease: Secondary | ICD-10-CM | POA: Diagnosis not present

## 2019-10-23 DIAGNOSIS — J449 Chronic obstructive pulmonary disease, unspecified: Secondary | ICD-10-CM | POA: Diagnosis not present

## 2019-10-23 DIAGNOSIS — I2510 Atherosclerotic heart disease of native coronary artery without angina pectoris: Secondary | ICD-10-CM | POA: Diagnosis not present

## 2019-10-23 DIAGNOSIS — I69351 Hemiplegia and hemiparesis following cerebral infarction affecting right dominant side: Secondary | ICD-10-CM | POA: Diagnosis not present

## 2019-10-27 ENCOUNTER — Ambulatory Visit (INDEPENDENT_AMBULATORY_CARE_PROVIDER_SITE_OTHER): Payer: Medicare Other | Admitting: Nurse Practitioner

## 2019-10-27 ENCOUNTER — Other Ambulatory Visit: Payer: Self-pay

## 2019-10-27 ENCOUNTER — Encounter: Payer: Self-pay | Admitting: Nurse Practitioner

## 2019-10-27 VITALS — BP 139/67 | HR 61 | Temp 97.7°F | Resp 22 | Ht 62.0 in | Wt 117.0 lb

## 2019-10-27 DIAGNOSIS — N183 Chronic kidney disease, stage 3 unspecified: Secondary | ICD-10-CM | POA: Diagnosis not present

## 2019-10-27 DIAGNOSIS — R600 Localized edema: Secondary | ICD-10-CM

## 2019-10-27 DIAGNOSIS — I130 Hypertensive heart and chronic kidney disease with heart failure and stage 1 through stage 4 chronic kidney disease, or unspecified chronic kidney disease: Secondary | ICD-10-CM | POA: Diagnosis not present

## 2019-10-27 DIAGNOSIS — I2510 Atherosclerotic heart disease of native coronary artery without angina pectoris: Secondary | ICD-10-CM | POA: Diagnosis not present

## 2019-10-27 DIAGNOSIS — I509 Heart failure, unspecified: Secondary | ICD-10-CM | POA: Diagnosis not present

## 2019-10-27 DIAGNOSIS — I69351 Hemiplegia and hemiparesis following cerebral infarction affecting right dominant side: Secondary | ICD-10-CM | POA: Diagnosis not present

## 2019-10-27 DIAGNOSIS — J449 Chronic obstructive pulmonary disease, unspecified: Secondary | ICD-10-CM | POA: Diagnosis not present

## 2019-10-27 MED ORDER — FUROSEMIDE 40 MG PO TABS: 40.0000 mg | ORAL_TABLET | Freq: Every day | ORAL | 2 refills | Status: DC

## 2019-10-27 NOTE — Progress Notes (Addendum)
Established Patient Office Visit  Subjective:  Patient ID: Crystal Cooper, female    DOB: 03/10/1927  Age: 86 y.o. MRN: [id number]  CC:  Chief Complaint  Patient presents with  . Edema    bilateral feet     HPI Crystal Cooper presents for bilateral lower +4 pitting edema. This is not a new problem for patient, lasix 60 mg was decreased last week  to 20 mg daily and patient started experincing increase swelling in lower legs, tightness and shortness of breath. No pain, dizziness or warmth reported .   Past Medical History:  Diagnosis Date  . Anemia    years ago  . Aortic stenosis   . Arthritis   . Asthma   . Atrial fibrillation ([facility])   . CHF (congestive heart failure) ([place]) 11/2014  . Family history of adverse reaction to anesthesia    2 daughters would have N/V  . Glaucoma   . Hearing loss   . Heart murmur   . HTN (hypertension)   . Pneumonia   . Prolapsing mitral leaflet syndrome   . Shortness of breath dyspnea    with exertion  . SVT (supraventricular tachycardia) ([facility])    S/P ablation of AVNRT in 2003    Past Surgical History:  Procedure Laterality Date  . APPENDECTOMY    . BIOPSY  04/07/2018   Procedure: BIOPSY;  Surgeon: Jerene Bears, MD;  Location: [facility] ENDOSCOPY;  Service: Gastroenterology;;  . BLADDER SURGERY    . CARDIAC CATHETERIZATION N/A 09/26/2015   Procedure: Right/Left Heart Cath and Coronary Angiography;  Surgeon: Burnell Blanks, MD;  Location: [facility] CV LAB;  Service: Cardiovascular;  Laterality: N/A;  . CARDIAC SURGERY    . CARDIOVERSION N/A 03/02/2016   Procedure: CARDIOVERSION;  Surgeon: Thayer Headings, MD;  Location: [facility];  Service: Cardiovascular;  Laterality: N/A;  . EP IMPLANTABLE DEVICE N/A 10/26/2015   Procedure: Pacemaker Implant;  Surgeon: Will Meredith Leeds, MD;  Location: [facility] CV LAB;  Service: Cardiovascular;  Laterality: N/A;  . ESOPHAGOGASTRODUODENOSCOPY (EGD) WITH PROPOFOL N/A 04/07/2018   Procedure:  ESOPHAGOGASTRODUODENOSCOPY (EGD) WITH PROPOFOL;  Surgeon: Jerene Bears, MD;  Location: [facility] ENDOSCOPY;  Service: Gastroenterology;  Laterality: N/A;  . ESOPHAGOGASTRODUODENOSCOPY (EGD) WITH PROPOFOL N/A 10/12/2019   Procedure: ESOPHAGOGASTRODUODENOSCOPY (EGD) WITH PROPOFOL;  Surgeon: Danie Binder, MD;  Location: [facility] ENDO SUITE;  Service: Endoscopy;  Laterality: N/A;  . EYE SURGERY Bilateral    cataract surgery  . HOT HEMOSTASIS N/A 04/07/2018   Procedure: HOT HEMOSTASIS (ARGON PLASMA COAGULATION/BICAP);  Surgeon: Jerene Bears, MD;  Location: [facility] ENDOSCOPY;  Service: Gastroenterology;  Laterality: N/A;  . HOT HEMOSTASIS  10/12/2019   Procedure: HOT HEMOSTASIS (ARGON PLASMA COAGULATION/BICAP);  Surgeon: Danie Binder, MD;  Location: [facility] ENDO SUITE;  Service: Endoscopy;;  . NASAL SINUS SURGERY    . TEE WITHOUT CARDIOVERSION N/A 10/25/2015   Procedure: TRANSESOPHAGEAL ECHOCARDIOGRAM (TEE);  Surgeon: Burnell Blanks, MD;  Location: [facility];  Service: Open Heart Surgery;  Laterality: N/A;  . TRANSCATHETER AORTIC VALVE REPLACEMENT, TRANSFEMORAL Right 10/25/2015   Procedure: TRANSCATHETER AORTIC VALVE REPLACEMENT, TRANSFEMORAL;  Surgeon: Burnell Blanks, MD;  Location: [facility];  Service: Open Heart Surgery;  Laterality: Right;    Family History  Problem Relation Age of Onset  . Hypertension Mother   . Pneumonia Father   . Heart attack Neg Hx   . Colon cancer Neg Hx   . Esophageal cancer Neg Hx  Social History   Socioeconomic History  . Marital status: Widowed    Spouse name: Not on file  . Number of children: 3  . Years of education: Not on file  . Highest education level: 12th grade  Occupational History  . Occupation: Retired  Tobacco Use  . Smoking status: Never Smoker  . Smokeless tobacco: Never Used  Substance and Sexual Activity  . Alcohol use: No    Alcohol/week: 0.0 standard drinks  . Drug use: No  . Sexual activity: Not Currently  Other Topics Concern  . Not on  file  Social History Narrative  . Not on file   Social Determinants of Health   Financial Resource Strain: Low Risk   . Difficulty of Paying Living Expenses: Not hard at all  Food Insecurity: No Food Insecurity  . Worried About Charity fundraiser in the Last Year: Never true  . Ran Out of Food in the Last Year: Never true  Transportation Needs: No Transportation Needs  . Lack of Transportation (Medical): No  . Lack of Transportation (Non-Medical): No  Physical Activity: Inactive  . Days of Exercise per Week: 0 days  . Minutes of Exercise per Session: 0 min  Stress: No Stress Concern Present  . Feeling of Stress : Not at all  Social Connections: Slightly Isolated  . Frequency of Communication with Friends and Family: More than three times a week  . Frequency of Social Gatherings with Friends and Family: More than three times a week  . Attends Religious Services: More than 4 times per year  . Active Member of Clubs or Organizations: Yes  . Attends Archivist Meetings: More than 4 times per year  . Marital Status: Widowed  Intimate Partner Violence: Not At Risk  . Fear of Current or Ex-Partner: No  . Emotionally Abused: No  . Physically Abused: No  . Sexually Abused: No    Outpatient Medications Prior to Visit  Medication Sig Dispense Refill  . acetaminophen (TYLENOL) 500 MG tablet Take 500 mg by mouth every 6 (six) hours as needed for mild pain or headache.     . albuterol (PROAIR HFA) 108 (90 Base) MCG/ACT inhaler Inhale 2 puffs into the lungs every 4 (four) hours as needed for wheezing or shortness of breath. 18 g 11  . amoxicillin (AMOXIL) 875 MG tablet Take 1 tablet (875 mg total) by mouth 2 (two) times daily. 14 tablet 0  . Biotin 10 MG CAPS Take 10 mg by mouth daily.     . Calcium Carb-Cholecalciferol (CALTRATE 600+D3) 600-800 MG-UNIT TABS Take 1 tablet by mouth daily.    [name] docusate sodium (COLACE) 100 MG capsule Take 100 mg by mouth daily as needed for mild  constipation.    . famotidine (PEPCID) 20 MG tablet Take 1 tablet (20 mg total) by mouth at bedtime. 30 tablet 1  . ferrous sulfate 325 (65 FE) MG tablet Take 1 tablet (325 mg total) by mouth 2 (two) times daily with a meal.  3  . fluticasone furoate-vilanterol (BREO ELLIPTA) 100-25 MCG/INH AEPB Inhale 1 puff into the lungs daily. 60 each 11  . furosemide (LASIX) 20 MG tablet Take 1 tablet (20 mg total) by mouth daily. 30 tablet 11  . metoprolol tartrate (LOPRESSOR) 25 MG tablet Take 1 tablet (25 mg total) by mouth 2 (two) times daily. 180 tablet 3  . multivitamin-iron-minerals-folic acid (CENTRUM) chewable tablet Chew 1 tablet by mouth daily.    [name] OVER THE COUNTER  MEDICATION Take 0.5 tablets by mouth daily as needed (for allergy). Patient not sure about the name.    . pantoprazole (PROTONIX) 40 MG tablet Take 1 tablet (40 mg total) by mouth 2 (two) times daily before a meal. 120 tablet 0   No facility-administered medications prior to visit.    Allergies  Allergen Reactions  . Hctz [Hydrochlorothiazide] Other (See Comments)    Pt was ill and this affected her kidneys   . Aspirin Other (See Comments)    Cardiologist said the patient is to not take this  . Codeine Other (See Comments)    Made the patient feel ill, has not had any problems since 1977    ROS Review of Systems  Constitutional: Negative for activity change, appetite change and fatigue.  HENT:       Patient is very hard of hearing   Respiratory: Negative for chest tightness and shortness of breath.   Cardiovascular: Positive for leg swelling. Negative for chest pain and palpitations.  Gastrointestinal: Negative for abdominal pain.  Genitourinary: Negative for difficulty urinating.  Musculoskeletal: Negative for arthralgias and myalgias.  Skin: Negative for rash.      Objective:    Physical Exam  Constitutional: She is oriented to person, place, and time. She appears well-developed and well-nourished.  HENT:    Head: Normocephalic.  Eyes: Conjunctivae are normal.  Cardiovascular: Normal rate and regular rhythm.  Pulmonary/Chest: Effort normal and breath sounds normal.  Abdominal: Soft. Bowel sounds are normal.  Musculoskeletal:     Cervical back: Neck supple.  Neurological: She is oriented to person, place, and time.  Skin: Skin is warm. No rash noted.  Slight skin tear on right hand    BP 139/67   Pulse 61   Temp 97.7 F (36.5 C)   Resp (!) 22   Ht 5\' 2"  (1.575 m)   Wt 117 lb (53.1 kg)   SpO2 100%   BMI 21.40 kg/m  Wt Readings from Last 3 Encounters:  10/27/19 117 lb (53.1 kg)  10/19/19 113 lb 9.6 oz (51.5 kg)  10/10/19 113 lb (51.3 kg)       Lab Results  Component Value Date   TSH 4.270 09/29/2019   Lab Results  Component Value Date   WBC 7.5 10/19/2019   HGB 9.1 (L) 10/19/2019   HCT 27.8 (L) 10/19/2019   MCV 96 10/19/2019   PLT 275 10/19/2019   Lab Results  Component Value Date   NA 136 10/19/2019   K 4.4 10/19/2019   CO2 25 10/19/2019   GLUCOSE 95 10/19/2019   BUN 19 10/19/2019   CREATININE 1.38 (H) 10/19/2019   BILITOT 0.3 10/19/2019   ALKPHOS 86 10/19/2019   AST 30 10/19/2019   ALT 22 10/19/2019   PROT 6.3 10/19/2019   ALBUMIN 3.7 10/19/2019   CALCIUM 9.0 10/19/2019   ANIONGAP 8 10/15/2019   Lab Results  Component Value Date   CHOL 129 09/29/2019   Lab Results  Component Value Date   HDL 59 09/29/2019   Lab Results  Component Value Date   LDLCALC 51 09/29/2019   Lab Results  Component Value Date   TRIG 101 09/29/2019   Lab Results  Component Value Date   CHOLHDL 2.2 09/29/2019   Lab Results  Component Value Date   HGBA1C 5.3 08/27/2019      Assessment & Plan:  Bilateral lower extremity edema not well controlled, patients lasix was reduced from 60 mg daily to 20 mg daily last  week. Provided education to patient and daughter. Start patient on lasix 40 mg daily patient knows to follow up with worsening or unresolved symptoms. Rx  sent to pharmacy. Problem List Items Addressed This Visit      Other   RESOLVED: Localized edema - Primary   Relevant Medications   furosemide (LASIX) 40 MG tablet      Meds ordered this encounter  Medications  . furosemide (LASIX) 40 MG tablet    Sig: Take 1 tablet (40 mg total) by mouth daily.    Dispense:  30 tablet    Refill:  2    Order Specific Question:   Supervising Provider    Answer:   Caryl Pina A [[id number]]    Follow-up: Return if symptoms worsen or fail to improve.    Ivy Lynn, NP

## 2019-10-27 NOTE — Patient Instructions (Addendum)
  Bilateral lower extremity edema not well controlled, patients lasix was reduced from 60 mg daily to 20 mg daily. Provided education to patient  and daughter. Start patient on 40 mg lasix daily patient knows to follow up with worsening or unresolved symptoms.  Rx sent to pharmacy   Edema  Edema is when you have too much fluid in your body or under your skin. Edema may make your legs, feet, and ankles swell up. Swelling is also common in looser tissues, like around your eyes. This is a common condition. It gets more common as you get older. There are many possible causes of edema. Eating too much salt (sodium) and being on your feet or sitting for a long time can cause edema in your legs, feet, and ankles. Hot weather may make edema worse. Edema is usually painless. Your skin may look swollen or shiny. Follow these instructions at home:  Keep the swollen body part raised (elevated) above the level of your heart when you are sitting or lying down.  Do not sit still or stand for a long time.  Do not wear tight clothes. Do not wear garters on your upper legs.  Exercise your legs. This can help the swelling go down.  Wear elastic bandages or support stockings as told by your doctor.  Eat a low-salt (low-sodium) diet to reduce fluid as told by your doctor.  Depending on the cause of your swelling, you may need to limit how much fluid you drink (fluid restriction).  Take over-the-counter and prescription medicines only as told by your doctor. Contact a doctor if:  Treatment is not working.  You have heart, liver, or kidney disease and have symptoms of edema.  You have sudden and unexplained weight gain. Get help right away if:  You have shortness of breath or chest pain.  You cannot breathe when you lie down.  You have pain, redness, or warmth in the swollen areas.  You have heart, liver, or kidney disease and get edema all of a sudden.  You have a fever and your symptoms get  worse all of a sudden. Summary  Edema is when you have too much fluid in your body or under your skin.  Edema may make your legs, feet, and ankles swell up. Swelling is also common in looser tissues, like around your eyes.  Raise (elevate) the swollen body part above the level of your heart when you are sitting or lying down.  Follow your doctor's instructions about diet and how much fluid you can drink (fluid restriction). This information is not intended to replace advice given to you by your health care provider. Make sure you discuss any questions you have with your health care provider. Document Revised: 06/14/2017 Document Reviewed: 06/29/2016 Elsevier Patient Education  2020 [employer].

## 2019-10-29 ENCOUNTER — Ambulatory Visit: Payer: Medicare Other | Admitting: *Deleted

## 2019-10-29 NOTE — Chronic Care Management (AMB) (Signed)
  Chronic Care Management   Follow-up Note  10/29/2019 Name: Crystal Cooper MRN: [id number] DOB: Jan 25, 1927  Referred by: [name], MD Reason for referral : Chronic Care Management (RN Follow up)   An unsuccessful telephone follow-up was attempted today. The patient was referred to the case management team for assistance with care management and care coordination. Patient was seen by PCP office 2 days ago and Lasix was increased from 20mg  to 40mg  daily for lower extremity edema.   Follow Up Plan: A HIPPA compliant phone message was left for the patient providing contact information and requesting a return call.  The care management team will reach out to the patient again over the next 7 days.   [name], BSN, RN-BC Embedded Chronic Care Manager [facility] Direct Dial: [phone]

## 2019-10-30 ENCOUNTER — Telehealth: Payer: Self-pay | Admitting: Family Medicine

## 2019-10-30 DIAGNOSIS — Z952 Presence of prosthetic heart valve: Secondary | ICD-10-CM | POA: Diagnosis not present

## 2019-10-30 DIAGNOSIS — M1990 Unspecified osteoarthritis, unspecified site: Secondary | ICD-10-CM | POA: Diagnosis not present

## 2019-10-30 DIAGNOSIS — I2510 Atherosclerotic heart disease of native coronary artery without angina pectoris: Secondary | ICD-10-CM | POA: Diagnosis not present

## 2019-10-30 DIAGNOSIS — J449 Chronic obstructive pulmonary disease, unspecified: Secondary | ICD-10-CM | POA: Diagnosis not present

## 2019-10-30 DIAGNOSIS — E041 Nontoxic single thyroid nodule: Secondary | ICD-10-CM | POA: Diagnosis not present

## 2019-10-30 DIAGNOSIS — Z8701 Personal history of pneumonia (recurrent): Secondary | ICD-10-CM | POA: Diagnosis not present

## 2019-10-30 DIAGNOSIS — K31819 Angiodysplasia of stomach and duodenum without bleeding: Secondary | ICD-10-CM | POA: Diagnosis not present

## 2019-10-30 DIAGNOSIS — Z7951 Long term (current) use of inhaled steroids: Secondary | ICD-10-CM | POA: Diagnosis not present

## 2019-10-30 DIAGNOSIS — I130 Hypertensive heart and chronic kidney disease with heart failure and stage 1 through stage 4 chronic kidney disease, or unspecified chronic kidney disease: Secondary | ICD-10-CM | POA: Diagnosis not present

## 2019-10-30 DIAGNOSIS — D631 Anemia in chronic kidney disease: Secondary | ICD-10-CM | POA: Diagnosis not present

## 2019-10-30 DIAGNOSIS — Z950 Presence of cardiac pacemaker: Secondary | ICD-10-CM | POA: Diagnosis not present

## 2019-10-30 DIAGNOSIS — M50222 Other cervical disc displacement at C5-C6 level: Secondary | ICD-10-CM | POA: Diagnosis not present

## 2019-10-30 DIAGNOSIS — N183 Chronic kidney disease, stage 3 unspecified: Secondary | ICD-10-CM | POA: Diagnosis not present

## 2019-10-30 DIAGNOSIS — H9190 Unspecified hearing loss, unspecified ear: Secondary | ICD-10-CM | POA: Diagnosis not present

## 2019-10-30 DIAGNOSIS — D509 Iron deficiency anemia, unspecified: Secondary | ICD-10-CM | POA: Diagnosis not present

## 2019-10-30 DIAGNOSIS — M4802 Spinal stenosis, cervical region: Secondary | ICD-10-CM | POA: Diagnosis not present

## 2019-10-30 DIAGNOSIS — I700 Atherosclerosis of aorta: Secondary | ICD-10-CM | POA: Diagnosis not present

## 2019-10-30 DIAGNOSIS — D62 Acute posthemorrhagic anemia: Secondary | ICD-10-CM | POA: Diagnosis not present

## 2019-10-30 DIAGNOSIS — R918 Other nonspecific abnormal finding of lung field: Secondary | ICD-10-CM | POA: Diagnosis not present

## 2019-10-30 DIAGNOSIS — I495 Sick sinus syndrome: Secondary | ICD-10-CM | POA: Diagnosis not present

## 2019-10-30 DIAGNOSIS — I69351 Hemiplegia and hemiparesis following cerebral infarction affecting right dominant side: Secondary | ICD-10-CM | POA: Diagnosis not present

## 2019-10-30 DIAGNOSIS — I5032 Chronic diastolic (congestive) heart failure: Secondary | ICD-10-CM | POA: Diagnosis not present

## 2019-10-30 DIAGNOSIS — I480 Paroxysmal atrial fibrillation: Secondary | ICD-10-CM | POA: Diagnosis not present

## 2019-10-30 DIAGNOSIS — M47812 Spondylosis without myelopathy or radiculopathy, cervical region: Secondary | ICD-10-CM | POA: Diagnosis not present

## 2019-10-30 DIAGNOSIS — G319 Degenerative disease of nervous system, unspecified: Secondary | ICD-10-CM | POA: Diagnosis not present

## 2019-11-02 DIAGNOSIS — D631 Anemia in chronic kidney disease: Secondary | ICD-10-CM | POA: Diagnosis not present

## 2019-11-02 DIAGNOSIS — K31819 Angiodysplasia of stomach and duodenum without bleeding: Secondary | ICD-10-CM | POA: Diagnosis not present

## 2019-11-02 DIAGNOSIS — N183 Chronic kidney disease, stage 3 unspecified: Secondary | ICD-10-CM | POA: Diagnosis not present

## 2019-11-02 DIAGNOSIS — J449 Chronic obstructive pulmonary disease, unspecified: Secondary | ICD-10-CM | POA: Diagnosis not present

## 2019-11-02 DIAGNOSIS — I5032 Chronic diastolic (congestive) heart failure: Secondary | ICD-10-CM | POA: Diagnosis not present

## 2019-11-02 DIAGNOSIS — I130 Hypertensive heart and chronic kidney disease with heart failure and stage 1 through stage 4 chronic kidney disease, or unspecified chronic kidney disease: Secondary | ICD-10-CM | POA: Diagnosis not present

## 2019-11-04 ENCOUNTER — Telehealth: Payer: Medicare Other

## 2019-11-04 ENCOUNTER — Telehealth: Payer: Self-pay | Admitting: Family Medicine

## 2019-11-04 DIAGNOSIS — K31819 Angiodysplasia of stomach and duodenum without bleeding: Secondary | ICD-10-CM | POA: Diagnosis not present

## 2019-11-04 DIAGNOSIS — I5032 Chronic diastolic (congestive) heart failure: Secondary | ICD-10-CM | POA: Diagnosis not present

## 2019-11-04 DIAGNOSIS — N183 Chronic kidney disease, stage 3 unspecified: Secondary | ICD-10-CM | POA: Diagnosis not present

## 2019-11-04 DIAGNOSIS — J449 Chronic obstructive pulmonary disease, unspecified: Secondary | ICD-10-CM | POA: Diagnosis not present

## 2019-11-04 DIAGNOSIS — D631 Anemia in chronic kidney disease: Secondary | ICD-10-CM | POA: Diagnosis not present

## 2019-11-04 DIAGNOSIS — I130 Hypertensive heart and chronic kidney disease with heart failure and stage 1 through stage 4 chronic kidney disease, or unspecified chronic kidney disease: Secondary | ICD-10-CM | POA: Diagnosis not present

## 2019-11-04 NOTE — Telephone Encounter (Signed)
Can a urine specimen be brought in to be checked?

## 2019-11-04 NOTE — Telephone Encounter (Signed)
Home health aware they will go tomorrow to get specimen. She is asking if you want to draw any labs on her while she is out with her tomorrow. Please advise what labs you would want drawn if any.

## 2019-11-04 NOTE — Telephone Encounter (Signed)
CBC would be nice.

## 2019-11-04 NOTE — Telephone Encounter (Signed)
sure

## 2019-11-04 NOTE — Telephone Encounter (Signed)
Verbal order given to nurse with Alvis Lemmings

## 2019-11-05 ENCOUNTER — Encounter: Payer: Self-pay | Admitting: Family Medicine

## 2019-11-05 ENCOUNTER — Ambulatory Visit (INDEPENDENT_AMBULATORY_CARE_PROVIDER_SITE_OTHER): Payer: Medicare Other | Admitting: *Deleted

## 2019-11-05 DIAGNOSIS — R410 Disorientation, unspecified: Secondary | ICD-10-CM

## 2019-11-05 DIAGNOSIS — N1832 Chronic kidney disease, stage 3b: Secondary | ICD-10-CM

## 2019-11-05 DIAGNOSIS — J449 Chronic obstructive pulmonary disease, unspecified: Secondary | ICD-10-CM | POA: Diagnosis not present

## 2019-11-05 DIAGNOSIS — N390 Urinary tract infection, site not specified: Secondary | ICD-10-CM | POA: Diagnosis not present

## 2019-11-05 DIAGNOSIS — N183 Chronic kidney disease, stage 3 unspecified: Secondary | ICD-10-CM | POA: Diagnosis not present

## 2019-11-05 DIAGNOSIS — I5032 Chronic diastolic (congestive) heart failure: Secondary | ICD-10-CM

## 2019-11-05 DIAGNOSIS — D631 Anemia in chronic kidney disease: Secondary | ICD-10-CM | POA: Diagnosis not present

## 2019-11-05 DIAGNOSIS — K31819 Angiodysplasia of stomach and duodenum without bleeding: Secondary | ICD-10-CM | POA: Diagnosis not present

## 2019-11-05 DIAGNOSIS — I130 Hypertensive heart and chronic kidney disease with heart failure and stage 1 through stage 4 chronic kidney disease, or unspecified chronic kidney disease: Secondary | ICD-10-CM | POA: Diagnosis not present

## 2019-11-06 DIAGNOSIS — I5032 Chronic diastolic (congestive) heart failure: Secondary | ICD-10-CM | POA: Diagnosis not present

## 2019-11-06 DIAGNOSIS — N183 Chronic kidney disease, stage 3 unspecified: Secondary | ICD-10-CM | POA: Diagnosis not present

## 2019-11-06 DIAGNOSIS — D631 Anemia in chronic kidney disease: Secondary | ICD-10-CM | POA: Diagnosis not present

## 2019-11-06 DIAGNOSIS — I130 Hypertensive heart and chronic kidney disease with heart failure and stage 1 through stage 4 chronic kidney disease, or unspecified chronic kidney disease: Secondary | ICD-10-CM | POA: Diagnosis not present

## 2019-11-06 DIAGNOSIS — J449 Chronic obstructive pulmonary disease, unspecified: Secondary | ICD-10-CM | POA: Diagnosis not present

## 2019-11-06 DIAGNOSIS — K31819 Angiodysplasia of stomach and duodenum without bleeding: Secondary | ICD-10-CM | POA: Diagnosis not present

## 2019-11-09 DIAGNOSIS — D631 Anemia in chronic kidney disease: Secondary | ICD-10-CM | POA: Diagnosis not present

## 2019-11-09 DIAGNOSIS — I130 Hypertensive heart and chronic kidney disease with heart failure and stage 1 through stage 4 chronic kidney disease, or unspecified chronic kidney disease: Secondary | ICD-10-CM | POA: Diagnosis not present

## 2019-11-09 DIAGNOSIS — K31819 Angiodysplasia of stomach and duodenum without bleeding: Secondary | ICD-10-CM | POA: Diagnosis not present

## 2019-11-09 DIAGNOSIS — I5032 Chronic diastolic (congestive) heart failure: Secondary | ICD-10-CM | POA: Diagnosis not present

## 2019-11-09 DIAGNOSIS — N183 Chronic kidney disease, stage 3 unspecified: Secondary | ICD-10-CM | POA: Diagnosis not present

## 2019-11-09 DIAGNOSIS — J449 Chronic obstructive pulmonary disease, unspecified: Secondary | ICD-10-CM | POA: Diagnosis not present

## 2019-11-10 ENCOUNTER — Telehealth: Payer: Self-pay | Admitting: Family Medicine

## 2019-11-10 ENCOUNTER — Ambulatory Visit: Payer: Medicare Other | Admitting: Licensed Clinical Social Worker

## 2019-11-10 DIAGNOSIS — N1832 Chronic kidney disease, stage 3b: Secondary | ICD-10-CM

## 2019-11-10 DIAGNOSIS — I10 Essential (primary) hypertension: Secondary | ICD-10-CM

## 2019-11-10 DIAGNOSIS — K219 Gastro-esophageal reflux disease without esophagitis: Secondary | ICD-10-CM

## 2019-11-10 DIAGNOSIS — I5032 Chronic diastolic (congestive) heart failure: Secondary | ICD-10-CM

## 2019-11-10 NOTE — Patient Instructions (Addendum)
Licensed Clinical Social Worker Visit Information  Goals we discussed today:  Goals    . Client will communicate with LCSW in next 30 days to talk about ADLs completion and challenges in doing daily activities of clinet (pt-stated)       Current Barriers:  . Challenges with ADLs completion in client with Chronic Diagnoses of  HTN< CKD, Hyponatremia, GERD, CHF . Some memory challenges  Clinical Social Work Clinical Goal(s):  [name] LCSW will communicate with client in next 30 days to talk with client about ADLs completion and challenges in doing daily activities for client  Interventions: Encouraged client/[name] to communicate with RNCM as needed for nursing support  Talked with [name] about client ADLs completion daily  Talked with [name] about social support network for client  Talked with client about ambulation challenges of client (uses a cane to ambulate)  Talked with [name] about sleeping challenges of client  Talked with [name] about DME equipment of client (client has tub bench at home to use as needed)  Talked with [name] about in home care needs of client [name] said client wanders at night, puts things in and out of drawers, looking for things, up at night)  [name] and LCSW talked of client care issues. [name] said client is sometimes non cooperative with family and caregivers.  Talked with [name] about memory issues of client  Talked with [name] about client's upcoming appointments  Talked with [name] about client swelling in legs,ankle  Patient Self Care Activities:  Attends scheduled medical appointments  . SELF CARE DEFICITS: Challenges in ADLs completion Some mobility challenges   Initial goal documentation       Materials Provided: No  Follow Up Plan:LCSW to call client/daughter, [name],in next 4 weeks to talk with client /daughterabout daily ADLs completion of client and about challenges for client in doing daily activities  The patient/[name], daughter, verbalized  understanding of instructions provided today and declined a print copy of patient instruction materials.   [name].Forrest MSW, LCSW Licensed Clinical Social Worker [facility] [phone]

## 2019-11-10 NOTE — Telephone Encounter (Signed)
Pts daughter called requesting to speak with nurse regarding patients BP. Has questions.

## 2019-11-10 NOTE — Telephone Encounter (Signed)
Returned call, no answer, left message to call back.

## 2019-11-10 NOTE — Telephone Encounter (Signed)
That is not a bad blood pressure. We may need to see her if elevation doesn't take away the swelling.

## 2019-11-10 NOTE — Telephone Encounter (Signed)
Left message informing daughter that Dr. Livia Snellen does not believe patient's blood pressure is bad. If rest and elevation does not help swelling then they need to call back for an appointment.

## 2019-11-10 NOTE — Chronic Care Management (AMB) (Signed)
Chronic Care Management    Clinical Social Work Follow Up Note  11/10/2019 Name: Crystal Cooper MRN: [id number] DOB: 1927-03-18  Crystal Cooper is a 86 y.o. year old female who is a primary care patient of Stacks, [name], MD. The CCM team was consulted for assistance with [employer].   Review of patient status, including review of consultants reports, other relevant assessments, and collaboration with appropriate care team members and the patient's provider was performed as part of comprehensive patient evaluation and provision of chronic care management services.    SDOH (Social Determinants of Health) assessments performed: Yes;risk for social isolation; risk for physical inactivity    Chronic Care Management from 09/11/2019 in [facility]  PHQ-9 Total Score  5     GAD 7 : Generalized Anxiety Score 09/11/2019  Nervous, Anxious, on Edge 1  Control/stop worrying 1  Worry too much - different things 1  Trouble relaxing 0  Restless 0  Easily annoyed or irritable 0  Afraid - awful might happen 0  Total GAD 7 Score 3  Anxiety Difficulty Somewhat difficult    Outpatient Encounter Medications as of 11/10/2019  Medication Sig  . acetaminophen (TYLENOL) 500 MG tablet Take 500 mg by mouth every 6 (six) hours as needed for mild pain or headache.   . albuterol (PROAIR HFA) 108 (90 Base) MCG/ACT inhaler Inhale 2 puffs into the lungs every 4 (four) hours as needed for wheezing or shortness of breath.  [name] amoxicillin (AMOXIL) 875 MG tablet Take 1 tablet (875 mg total) by mouth 2 (two) times daily.  . Biotin 10 MG CAPS Take 10 mg by mouth daily.   . Calcium Carb-Cholecalciferol (CALTRATE 600+D3) 600-800 MG-UNIT TABS Take 1 tablet by mouth daily.  [name] docusate sodium (COLACE) 100 MG capsule Take 100 mg by mouth daily as needed for mild constipation.  . famotidine (PEPCID) 20 MG tablet Take 1 tablet (20 mg total) by mouth at bedtime.  . ferrous sulfate 325 (65 FE) MG  tablet Take 1 tablet (325 mg total) by mouth 2 (two) times daily with a meal.  . fluticasone furoate-vilanterol (BREO ELLIPTA) 100-25 MCG/INH AEPB Inhale 1 puff into the lungs daily.  . furosemide (LASIX) 40 MG tablet Take 1 tablet (40 mg total) by mouth daily.  . metoprolol tartrate (LOPRESSOR) 25 MG tablet Take 1 tablet (25 mg total) by mouth 2 (two) times daily.  . multivitamin-iron-minerals-folic acid (CENTRUM) chewable tablet Chew 1 tablet by mouth daily.  [name] OVER THE COUNTER MEDICATION Take 0.5 tablets by mouth daily as needed (for allergy). Patient not sure about the name.  . pantoprazole (PROTONIX) 40 MG tablet Take 1 tablet (40 mg total) by mouth 2 (two) times daily before a meal.   No facility-administered encounter medications on file as of 11/10/2019.    Goals    . Client will communicate with LCSW in next 30 days to talk about ADLs completion and challenges in doing daily activities of clinet (pt-stated)       Current Barriers:  . Challenges with ADLs completion in client with Chronic Diagnoses of  HTN< CKD, Hyponatremia, GERD, CHF . Some memory challenges  Clinical Social Work Clinical Goal(s):  [name] LCSW will communicate with client in next 30 days to talk with client about ADLs completion and challenges in doing daily activities for client  Interventions:  Encouraged client/[name] to communicate with RNCM as needed for nursing support  Talked with [name] about client ADLs completion daily  Talked  with [name] about social support network for client   Talked with client about ambulation challenges of client (uses a cane to ambulate)  Talked with [name] about sleeping challenges of client  Talked with [name] about DME equipment of client (client has tub bench at home to use as needed)  Talked with [name] about in home care needs of client [name] said client wanders at night, puts things in and out of drawers, looking for things, up at night)  [name] and LCSW talked of client  care issues. [name] said client is sometimes non cooperative with family and caregivers.  Talked with [name] about memory issues of client  Talked with [name] about client's upcoming appointments  Talked with [name] about client swelling in legs,ankle  Patient Self Care Activities:  Attends scheduled medical appointments  . SELF CARE DEFICITS: Challenges in ADLs completion Some mobility challenges   Initial goal documentation       Follow Up Plan:LCSW to call client/daughter, [name], in next 4 weeks to talk with client /daughter about daily ADLs completion of client and about challenges for client in doing daily activities.  [name].Karol Skarzynski MSW, LCSW Licensed Clinical Social Worker [facility] [phone]

## 2019-11-10 NOTE — Telephone Encounter (Signed)
Daughter reports patients left leg is swelling and blood pressure 142/84.   Advised rest and elevation for swelling. Any further recommendations?

## 2019-11-11 ENCOUNTER — Encounter: Payer: Self-pay | Admitting: Neurology

## 2019-11-11 ENCOUNTER — Ambulatory Visit (INDEPENDENT_AMBULATORY_CARE_PROVIDER_SITE_OTHER): Payer: Medicare Other | Admitting: Neurology

## 2019-11-11 ENCOUNTER — Other Ambulatory Visit: Payer: Self-pay | Admitting: Neurology

## 2019-11-11 ENCOUNTER — Other Ambulatory Visit: Payer: Self-pay

## 2019-11-11 ENCOUNTER — Ambulatory Visit (INDEPENDENT_AMBULATORY_CARE_PROVIDER_SITE_OTHER): Payer: Medicare Other | Admitting: *Deleted

## 2019-11-11 VITALS — BP 139/72 | HR 64 | Ht 61.0 in | Wt 112.0 lb

## 2019-11-11 DIAGNOSIS — I480 Paroxysmal atrial fibrillation: Secondary | ICD-10-CM

## 2019-11-11 DIAGNOSIS — F0151 Vascular dementia with behavioral disturbance: Secondary | ICD-10-CM

## 2019-11-11 DIAGNOSIS — I63511 Cerebral infarction due to unspecified occlusion or stenosis of right middle cerebral artery: Secondary | ICD-10-CM | POA: Diagnosis not present

## 2019-11-11 DIAGNOSIS — G309 Alzheimer's disease, unspecified: Secondary | ICD-10-CM

## 2019-11-11 DIAGNOSIS — R799 Abnormal finding of blood chemistry, unspecified: Secondary | ICD-10-CM | POA: Diagnosis not present

## 2019-11-11 DIAGNOSIS — F0390 Unspecified dementia without behavioral disturbance: Secondary | ICD-10-CM | POA: Diagnosis not present

## 2019-11-11 DIAGNOSIS — I639 Cerebral infarction, unspecified: Secondary | ICD-10-CM

## 2019-11-11 DIAGNOSIS — R413 Other amnesia: Secondary | ICD-10-CM | POA: Diagnosis not present

## 2019-11-11 DIAGNOSIS — F01518 Vascular dementia, unspecified severity, with other behavioral disturbance: Secondary | ICD-10-CM

## 2019-11-11 LAB — CUP PACEART REMOTE DEVICE CHECK
Battery Remaining Longevity: 55 mo
Battery Voltage: 2.99 V
Brady Statistic AP VP Percent: 0 %
Brady Statistic AP VS Percent: 0 %
Brady Statistic AS VP Percent: 89.7 %
Brady Statistic AS VS Percent: 10.3 %
Brady Statistic RA Percent Paced: 0 %
Brady Statistic RV Percent Paced: 89.93 %
Date Time Interrogation Session: 20210519091218
Implantable Lead Implant Date: 20170503
Implantable Lead Implant Date: 20170503
Implantable Lead Location: 753859
Implantable Lead Location: 753860
Implantable Lead Model: 5076
Implantable Lead Model: 5076
Implantable Pulse Generator Implant Date: 20170503
Lead Channel Impedance Value: 285 Ohm
Lead Channel Impedance Value: 342 Ohm
Lead Channel Impedance Value: 418 Ohm
Lead Channel Impedance Value: 418 Ohm
Lead Channel Pacing Threshold Amplitude: 1 V
Lead Channel Pacing Threshold Amplitude: 1.125 V
Lead Channel Pacing Threshold Pulse Width: 0.4 ms
Lead Channel Pacing Threshold Pulse Width: 0.4 ms
Lead Channel Sensing Intrinsic Amplitude: 4.25 mV
Lead Channel Sensing Intrinsic Amplitude: 4.25 mV
Lead Channel Sensing Intrinsic Amplitude: 9.875 mV
Lead Channel Sensing Intrinsic Amplitude: 9.875 mV
Lead Channel Setting Pacing Amplitude: 2 V
Lead Channel Setting Pacing Amplitude: 2.5 V
Lead Channel Setting Pacing Pulse Width: 0.4 ms
Lead Channel Setting Sensing Sensitivity: 2.8 mV

## 2019-11-11 MED ORDER — MEMANTINE HCL 28 X 5 MG & 21 X 10 MG PO TABS: ORAL_TABLET | ORAL | 12 refills | Status: DC

## 2019-11-11 NOTE — Patient Instructions (Signed)
I had a long discussion the patient and her daughter regarding her significant cognitive decline and memory changes following recent right MCA stroke and answered questions.  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  I recommend further evaluation by checking memory panel labs, EEG and MRI scan for any interval new stroke.  Trial of Namenda starter pack to be increased as tolerated to 10 mg twice daily.  Resume Eliquis 2.5 twice daily for secondary stroke prevention due to history of atrial fibrillation and recent stroke despite having recent GI bleed as risk-benefit would be in favor of treatment.  The daughter and patient understand the risk and in agreement.  She will return for follow-up in 2 months or call earlier if necessary.  Dementia Caregiver Guide Dementia is a term used to describe a number of symptoms that affect memory and thinking. The most common symptoms include:  Memory loss.  Trouble with language and communication.  Trouble concentrating.  Poor judgment.  Problems with reasoning.  Child-like behavior and language.  Extreme anxiety.  Angry outbursts.  Wandering from home or public places. Dementia usually gets worse slowly over time. In the early stages, people with dementia can stay independent and safe with some help. In later stages, they need help with daily tasks such as dressing, grooming, and using the bathroom. How to help the person with dementia cope Dementia can be frightening and confusing. Here are some tips to help the person with dementia cope with changes caused by the disease. General tips  Keep the person on track with his or her routine.  Try to identify areas where the person may need help.  Be supportive, patient, calm, and encouraging.  Gently remind the person that adjusting to changes takes time.  Help with the tasks that the person has asked for help with.  Keep the person  involved in daily tasks and decisions as much as possible.  Encourage conversation, but try not to get frustrated or harried if the person struggles to find words or does not seem to appreciate your help. Communication tips  When the person is talking or seems frustrated, make eye contact and hold the person's hand.  Ask specific questions that need yes or no answers.  Use simple words, short sentences, and a calm voice. Only give one direction at a time.  When offering choices, limit them to just 1 or 2.  Avoid correcting the person in a negative way.  If the person is struggling to find the right words, gently try to help him or her. How to recognize symptoms of stress Symptoms of stress in caregivers include:  Feeling frustrated or angry with the person with dementia.  Denying that the person has dementia or that his or her symptoms will not improve.  Feeling hopeless and unappreciated.  Difficulty sleeping.  Difficulty concentrating.  Feeling anxious, irritable, or depressed.  Developing stress-related health problems.  Feeling like you have too little time for your own life. Follow these instructions at home:   Make sure that you and the person you are caring for: ? Get regular sleep. ? Exercise regularly. ? Eat regular, nutritious meals. ? Drink enough fluid to keep your urine clear or pale yellow. ? Take over-the-counter and prescription medicines only as told by your health care providers. ? Attend all scheduled health care appointments.  Join a support group with others who are caregivers.  Ask about respite care resources so that  you can have a regular break from the stress of caregiving.  Look for signs of stress in yourself and in the person you are caring for. If you notice signs of stress, take steps to manage it.  Consider any safety risks and take steps to avoid them.  Organize medications in a pill box for each day of the week.  Create a plan to  handle any legal or financial matters. Get legal or financial advice if needed.  Keep a calendar in a central location to remind the person of appointments or other activities. Tips for reducing the risk of injury  Keep floors clear of clutter. Remove rugs, magazine racks, and floor lamps.  Keep hallways well lit, especially at night.  Put a handrail and nonslip mat in the bathtub or shower.  Put childproof locks on cabinets that contain dangerous items, such as medicines, alcohol, guns, toxic cleaning items, sharp tools or utensils, matches, and lighters.  Put the locks in places where the person cannot see or reach them easily. This will help ensure that the person does not wander out of the house and get lost.  Be prepared for emergencies. Keep a list of emergency phone numbers and addresses in a convenient area.  Remove car keys and lock garage doors so that the person does not try to get in the car and drive.  Have the person wear a bracelet that tracks locations and identifies the person as having memory problems. This should be worn at all times for safety. Where to find support: Many individuals and organizations offer support. These include:  Support groups for people with dementia and for caregivers.  Counselors or therapists.  Home health care services.  Adult day care centers. Where to find more information Alzheimer's Association: [employer] Contact a health care provider if:  The person's health is rapidly getting worse.  You are no longer able to care for the person.  Caring for the person is affecting your physical and emotional health.  The person threatens himself or herself, you, or anyone else. Summary  Dementia is a term used to describe a number of symptoms that affect memory and thinking.  Dementia usually gets worse slowly over time.  Take steps to reduce the person's risk of injury, and to plan for future care.  Caregivers need support,  relief from caregiving, and time for their own lives. This information is not intended to replace advice given to you by your health care provider. Make sure you discuss any questions you have with your health care provider. Document Revised: 05/24/2017 Document Reviewed: 05/15/2016 Elsevier Patient Education  2020 [employer].

## 2019-11-11 NOTE — Progress Notes (Signed)
[facility] [address] [place]. [place] [address] [phone]       OFFICE FOLLOW-UP NOTE  [name] Date of Birth:  1927-01-15 Medical Record Number:  [id number]   HPI:  Referring physician Dr  Reesa Chew  Reason for referral : dementia and stroke  [name] is a 86 year old Caucasian lady seen today for initial office consultation visit following significant new cognitive changes following her recent stroke.  She is accompanied by her daughter.  History is obtained from them, review of electronic medical records and I personally reviewed available imaging films in PACS.  She has a past medical history of SVT s/p ablation, mitral valve prolapse, hypertension, atrial fibrillation on chronic anticoagulation who had developed GI hemorrhage and hence anticoagulation was stopped.  She will was found on the floor unresponsive.  She was considered outside the code stroke window.  CT scan showed no acute abnormality but MRI scan showed right thalamocapsular junction infarct with punctate infarcts also involving the right insula, right parietal and occipital lobes with multiple old infarcts involving the basal ganglia, right thalamus, cerebellum bilaterally.  Carotid ultrasound showed no significant bilateral extracranial stenosis.  2D echo done in January 2021 showed normal ejection fraction.  Patient also had previous changes of transaortic valve repair.  LDL cholesterol was 83 mg percent and hemoglobin A1c was 5.3.  Patient had been off anticoagulation due to multiple embolic strokes after careful discussion of risk benefits of anticoagulation and bleeding and discussion with patient and daughter she will was recommended to go back on Eliquis.  The daughter however informs me that the patient has not been taking Eliquis as they were waiting on seeing the primary care physician on the have not yet seen.  However the daughter's main complaint today is that patient had significant  cognitive worsening.  She is has increasing confusion and agitation with caregivers as well as family members.  She often does not follow directions and when caregivers are not nearby she on one occasion went to the mailbox and on another occasion got a screwdriver to try to open the door and it was locked.  She often refuses bathing and self-care.  On Mother's Day she had to have children and grandchildren and later on asked where they were going in did not remember them having, and wished her.  She feels straight up her coffee and eats the food called.  She is seeing people in the home who were not there and some of them are even deceased.  At one visit she wore a double panties and double depends and double pads.  The daughter feels that she is clearly had major cognitive decline since the recent strokes.  She has not been evaluated for reversible causes of cognitive impairment and has not been tried on Aricept or Namenda.  The daughter is not sure whether she had some cognitive impairment even before her recent strokes.  Patient lives by herself but the daughter lives right next door and helps her out with most activities.  There is no prior history of seizures.  There is no family history of dementia.  ROS:   14 system review of systems is positive for memory loss, confusion, hallucinations, disorientation, getting lost and all other systems negative  PMH:  Past Medical History:  Diagnosis Date  . Anemia    years ago  . Aortic stenosis   . Arthritis   . Asthma   . Atrial fibrillation ([place])   . CHF (  congestive heart failure) ([place]) 11/2014  . Family history of adverse reaction to anesthesia    2 daughters would have N/V  . Glaucoma   . Hearing loss   . Heart murmur   . HTN (hypertension)   . Pneumonia   . Prolapsing mitral leaflet syndrome   . Shortness of breath dyspnea    with exertion  . Stroke ([place])   . SVT (supraventricular tachycardia) ([facility])    S/P ablation of AVNRT in 2003     Social History:  Social History   Socioeconomic History  . Marital status: Widowed    Spouse name: Not on file  . Number of children: 3  . Years of education: Not on file  . Highest education level: 12th grade  Occupational History  . Occupation: Retired  Tobacco Use  . Smoking status: Never Smoker  . Smokeless tobacco: Never Used  Substance and Sexual Activity  . Alcohol use: No    Alcohol/week: 0.0 standard drinks  . Drug use: No  . Sexual activity: Not Currently  Other Topics Concern  . Not on file  Social History Narrative  . Not on file   Social Determinants of Health   Financial Resource Strain: Low Risk   . Difficulty of Paying Living Expenses: Not hard at all  Food Insecurity: No Food Insecurity  . Worried About Charity fundraiser in the Last Year: Never true  . Ran Out of Food in the Last Year: Never true  Transportation Needs: No Transportation Needs  . Lack of Transportation (Medical): No  . Lack of Transportation (Non-Medical): No  Physical Activity: Inactive  . Days of Exercise per Week: 0 days  . Minutes of Exercise per Session: 0 min  Stress: No Stress Concern Present  . Feeling of Stress : Not at all  Social Connections: Slightly Isolated  . Frequency of Communication with Friends and Family: More than three times a week  . Frequency of Social Gatherings with Friends and Family: More than three times a week  . Attends Religious Services: More than 4 times per year  . Active Member of Clubs or Organizations: Yes  . Attends Archivist Meetings: More than 4 times per year  . Marital Status: Widowed  Intimate Partner Violence: Not At Risk  . Fear of Current or Ex-Partner: No  . Emotionally Abused: No  . Physically Abused: No  . Sexually Abused: No    Medications:   Current Outpatient Medications on File Prior to Visit  Medication Sig Dispense Refill  . acetaminophen (TYLENOL) 500 MG tablet Take 500 mg by mouth every 6 (six) hours as  needed for mild pain or headache.     . albuterol (PROAIR HFA) 108 (90 Base) MCG/ACT inhaler Inhale 2 puffs into the lungs every 4 (four) hours as needed for wheezing or shortness of breath. 18 g 11  . amoxicillin (AMOXIL) 875 MG tablet Take 1 tablet (875 mg total) by mouth 2 (two) times daily. 14 tablet 0  . Biotin 10 MG CAPS Take 10 mg by mouth daily.     . Calcium Carb-Cholecalciferol (CALTRATE 600+D3) 600-800 MG-UNIT TABS Take 1 tablet by mouth daily.    [name] docusate sodium (COLACE) 100 MG capsule Take 100 mg by mouth daily as needed for mild constipation.    . famotidine (PEPCID) 20 MG tablet Take 1 tablet (20 mg total) by mouth at bedtime. 30 tablet 1  . ferrous sulfate 325 (65 FE) MG tablet Take 1 tablet (  325 mg total) by mouth 2 (two) times daily with a meal.  3  . fluticasone furoate-vilanterol (BREO ELLIPTA) 100-25 MCG/INH AEPB Inhale 1 puff into the lungs daily. 60 each 11  . furosemide (LASIX) 40 MG tablet Take 1 tablet (40 mg total) by mouth daily. 30 tablet 2  . metoprolol tartrate (LOPRESSOR) 25 MG tablet Take 1 tablet (25 mg total) by mouth 2 (two) times daily. 180 tablet 3  . multivitamin-iron-minerals-folic acid (CENTRUM) chewable tablet Chew 1 tablet by mouth daily.    [name] OVER THE COUNTER MEDICATION Take 0.5 tablets by mouth daily as needed (for allergy). Patient not sure about the name.    . pantoprazole (PROTONIX) 40 MG tablet Take 1 tablet (40 mg total) by mouth 2 (two) times daily before a meal. 120 tablet 0   No current facility-administered medications on file prior to visit.    Allergies:   Allergies  Allergen Reactions  . Hctz [Hydrochlorothiazide] Other (See Comments)    Pt was ill and this affected her kidneys   . Aspirin Other (See Comments)    Cardiologist said the patient is to not take this  . Codeine Other (See Comments)    Made the patient feel ill, has not had any problems since 1977    Physical Exam General: well developed, well nourished elderly  Caucasian lady, seated, in no evident distress.  She is extremely hard of hearing. Head: head normocephalic and atraumatic.  Neck: supple with no carotid or supraclavicular bruits Cardiovascular: regular rate and rhythm, no murmurs Musculoskeletal: no deformity Skin:  no rash/petichiae Vascular:  Normal pulses all extremities Vitals:   11/11/19 1348  BP: 139/72  Pulse: 64   Neurologic Exam Mental Status: Awake and fully alert. Oriented to place and time. Recent and remote memory poor attention span, concentration and fund of knowledge diminished mood and affect appropriate.  Recall 0/3.  Able to name only 3 animals which can walk on 4 legs.  Unable to copy intersecting pentagons.  Clock drawing 1/4.  Patient unable to complete formal Mini-Mental status exam. Cranial Nerves: Fundoscopic exam reveals sharp disc margins. Pupils equal, briskly reactive to light. Extraocular movements full without nystagmus. Visual fields full to confrontation. Hearing significantly diminished bilaterally despite hearing aids.  Facial sensation intact. Face, tongue, palate moves normally and symmetrically.  Motor: Normal bulk and tone. Normal strength in all tested extremity muscles.  Diminished fine finger movements on the left.  Orbits right over left upper extremity.  Trace weakness of left grip. Sensory.: intact to touch ,pinprick .position and vibratory sensation.  Coordination: Rapid alternating movements normal in all extremities. Finger-to-nose and heel-to-shin performed accurately bilaterally. Gait and Station: Arises from chair without difficulty. Stance is stooped gait demonstrates normal stride length and balance .  Tandem walking not tested. Reflexes: 1+ and symmetric. Toes downgoing.   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     PLAN: I had a long discussion the patient and her daughter regarding her significant cognitive decline and memory changes following recent right MCA stroke and answered questions.  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  I recommend further evaluation by checking memory panel labs, EEG and MRI scan for any interval new stroke.  Trial of Namenda starter pack to be increased as tolerated to 10 mg twice daily.  Resume Eliquis 2.5 twice daily for secondary stroke prevention due to history of atrial fibrillation and recent stroke despite having recent GI bleed as risk-benefit would be in favor of treatment.  The daughter and patient understand the risk and in agreement.  She will return for follow-up in 2 months or call earlier if necessary. Greater than 50% of time during this 50 minute consultation visit was spent on counseling,explanation of diagnosis dementia and stroke, planning of further management, discussion with patient and family and coordination of care Antony Contras, MD  [facility] [address] [place], [place] [address]  Phone [phone] Fax [phone] Note: This document was prepared with digital dictation and possible smart phrase technology. Any transcriptional errors that result from this process are unintentional

## 2019-11-12 ENCOUNTER — Telehealth: Payer: Self-pay | Admitting: Neurology

## 2019-11-12 ENCOUNTER — Other Ambulatory Visit: Payer: Self-pay

## 2019-11-12 ENCOUNTER — Telehealth: Payer: Self-pay | Admitting: Family Medicine

## 2019-11-12 DIAGNOSIS — D631 Anemia in chronic kidney disease: Secondary | ICD-10-CM | POA: Diagnosis not present

## 2019-11-12 DIAGNOSIS — K31819 Angiodysplasia of stomach and duodenum without bleeding: Secondary | ICD-10-CM | POA: Diagnosis not present

## 2019-11-12 DIAGNOSIS — I130 Hypertensive heart and chronic kidney disease with heart failure and stage 1 through stage 4 chronic kidney disease, or unspecified chronic kidney disease: Secondary | ICD-10-CM | POA: Diagnosis not present

## 2019-11-12 DIAGNOSIS — J449 Chronic obstructive pulmonary disease, unspecified: Secondary | ICD-10-CM | POA: Diagnosis not present

## 2019-11-12 DIAGNOSIS — I5032 Chronic diastolic (congestive) heart failure: Secondary | ICD-10-CM | POA: Diagnosis not present

## 2019-11-12 DIAGNOSIS — N183 Chronic kidney disease, stage 3 unspecified: Secondary | ICD-10-CM | POA: Diagnosis not present

## 2019-11-12 LAB — DEMENTIA PANEL
Homocysteine: 13.7 umol/L (ref 0.0–21.3)
RPR Ser Ql: NONREACTIVE
TSH: 4.68 u[IU]/mL — ABNORMAL HIGH (ref 0.450–4.500)
Vitamin B-12: 927 pg/mL (ref 232–1245)

## 2019-11-12 MED ORDER — SULFAMETHOXAZOLE-TRIMETHOPRIM 800-160 MG PO TABS: 1.0000 | ORAL_TABLET | Freq: Two times a day (BID) | ORAL | 0 refills | Status: DC

## 2019-11-12 MED ORDER — MEMANTINE HCL 10 MG PO TABS: 10.0000 mg | ORAL_TABLET | Freq: Two times a day (BID) | ORAL | 0 refills | Status: DC

## 2019-11-12 MED ORDER — MEMANTINE HCL 5 MG PO TABS: ORAL_TABLET | ORAL | 0 refills | Status: DC

## 2019-11-12 NOTE — Telephone Encounter (Signed)
Spoke with daughter and notified that we received fax from [facility] yesterday evening and we would send in an antibiotic to [facility] in [place]. Sent in Bactrim DS po BID for 10 days. Daughter verbalized understanding

## 2019-11-12 NOTE — Progress Notes (Signed)
mem

## 2019-11-12 NOTE — Telephone Encounter (Signed)
Medicare/[employer] insurance order sent to [facility]. No auth they will reach out to the patient to schedule.

## 2019-11-13 ENCOUNTER — Ambulatory Visit (INDEPENDENT_AMBULATORY_CARE_PROVIDER_SITE_OTHER): Payer: Medicare Other | Admitting: Family Medicine

## 2019-11-13 ENCOUNTER — Other Ambulatory Visit: Payer: Self-pay

## 2019-11-13 ENCOUNTER — Encounter: Payer: Self-pay | Admitting: Family Medicine

## 2019-11-13 VITALS — BP 118/59 | HR 62 | Temp 98.4°F | Ht 61.0 in | Wt 115.1 lb

## 2019-11-13 DIAGNOSIS — I6930 Unspecified sequelae of cerebral infarction: Secondary | ICD-10-CM | POA: Diagnosis not present

## 2019-11-13 DIAGNOSIS — H6121 Impacted cerumen, right ear: Secondary | ICD-10-CM | POA: Diagnosis not present

## 2019-11-13 NOTE — Progress Notes (Signed)
Remote pacemaker transmission.   

## 2019-11-13 NOTE — Patient Instructions (Signed)
Get Debrox drops and use in the right ear for two weeks.

## 2019-11-13 NOTE — Progress Notes (Signed)
   BP (!) 118/59   Pulse 62   Temp 98.4 F (36.9 C) (Temporal)   Ht 5\' 1"  (1.549 m)   Wt 115 lb 2 oz (52.2 kg)   BMI 21.75 kg/m    Subjective:   Patient ID: Crystal Cooper, female    DOB: 10-Dec-1926, 86 y.o.   MRN: [id number]  HPI: Crystal Cooper is a 86 y.o. female presenting on 11/13/2019 for Cerumen Impaction (pt here today for right ear cleaning-was told by the hearing doctor this morning that her ear was stopped up)   HPI Patient is coming in because she has had some hearing loss especially in the right ear more over the past week or 2 or maybe longer per daughter.  They went to the hearing doctor this morning and he looked and saw that her ear was stopped up and sent her over here to get it cleaned out so that she could hear a little bit better.  She does wear hearing aids in both ears but the left ear does not appear to be stopped up just the right ear.  Relevant past medical, surgical, family and social history reviewed and updated as indicated. Interim medical history since our last visit reviewed. Allergies and medications reviewed and updated.  Review of Systems  HENT: Positive for hearing loss. Negative for congestion, rhinorrhea and sinus pain.     Per HPI unless specifically indicated above      Objective:   BP (!) 118/59   Pulse 62   Temp 98.4 F (36.9 C) (Temporal)   Ht 5\' 1"  (1.549 m)   Wt 115 lb 2 oz (52.2 kg)   BMI 21.75 kg/m   Wt Readings from Last 3 Encounters:  11/13/19 115 lb 2 oz (52.2 kg)  11/11/19 112 lb (50.8 kg)  10/27/19 117 lb (53.1 kg)    Physical Exam Vitals and nursing note reviewed.  Constitutional:      General: She is not in acute distress.    Appearance: She is well-developed. She is not diaphoretic.  HENT:     Right Ear: There is impacted cerumen.  Eyes:     Conjunctiva/sclera: Conjunctivae normal.  Neurological:     Mental Status: She is alert.       Assessment & Plan:   Problem List Items Addressed This Visit    None    Visit Diagnoses    Hearing loss due to cerumen impaction, right    -  Primary      Nurse to lavage cerumen, patient tolerated well Follow up plan: Return if symptoms worsen or fail to improve.  Counseling provided for all of the vaccine components No orders of the defined types were placed in this encounter.   Caryl Pina, MD [facility] 11/13/2019, 4:30 PM

## 2019-11-16 NOTE — Telephone Encounter (Signed)
Order was sent to [facility] and schedule for JUne 2021.

## 2019-11-16 NOTE — Telephone Encounter (Signed)
Noted, faxed pacemaker information to [facility]. If it is MRI safe they will reach out to the patient to schedule.

## 2019-11-16 NOTE — Addendum Note (Signed)
Addended by: Sarina Ill B on: 11/16/2019 08:11 AM   Modules accepted: Orders

## 2019-11-16 NOTE — Telephone Encounter (Signed)
Patient has a Scientific laboratory technician.. when you get a chance can you put a new MRI order in for [facility] Thank you!!!

## 2019-11-16 NOTE — Telephone Encounter (Signed)
This is dr. Clydene Fake patient?

## 2019-11-17 NOTE — Progress Notes (Signed)
Kindly inform the patient that lab work for reversible causes of memory loss is all normal except for slightly elevated thyroid hormone and she needs to see primary care physician to discuss management for this

## 2019-11-17 NOTE — Telephone Encounter (Signed)
Patient is scheduled for 12/02/19.

## 2019-11-18 DIAGNOSIS — N183 Chronic kidney disease, stage 3 unspecified: Secondary | ICD-10-CM | POA: Diagnosis not present

## 2019-11-18 DIAGNOSIS — D631 Anemia in chronic kidney disease: Secondary | ICD-10-CM | POA: Diagnosis not present

## 2019-11-18 DIAGNOSIS — I5032 Chronic diastolic (congestive) heart failure: Secondary | ICD-10-CM | POA: Diagnosis not present

## 2019-11-18 DIAGNOSIS — K31819 Angiodysplasia of stomach and duodenum without bleeding: Secondary | ICD-10-CM | POA: Diagnosis not present

## 2019-11-18 DIAGNOSIS — I130 Hypertensive heart and chronic kidney disease with heart failure and stage 1 through stage 4 chronic kidney disease, or unspecified chronic kidney disease: Secondary | ICD-10-CM | POA: Diagnosis not present

## 2019-11-18 DIAGNOSIS — J449 Chronic obstructive pulmonary disease, unspecified: Secondary | ICD-10-CM | POA: Diagnosis not present

## 2019-11-20 DIAGNOSIS — I130 Hypertensive heart and chronic kidney disease with heart failure and stage 1 through stage 4 chronic kidney disease, or unspecified chronic kidney disease: Secondary | ICD-10-CM | POA: Diagnosis not present

## 2019-11-20 DIAGNOSIS — K31819 Angiodysplasia of stomach and duodenum without bleeding: Secondary | ICD-10-CM | POA: Diagnosis not present

## 2019-11-20 DIAGNOSIS — D631 Anemia in chronic kidney disease: Secondary | ICD-10-CM | POA: Diagnosis not present

## 2019-11-20 DIAGNOSIS — J449 Chronic obstructive pulmonary disease, unspecified: Secondary | ICD-10-CM | POA: Diagnosis not present

## 2019-11-20 DIAGNOSIS — N183 Chronic kidney disease, stage 3 unspecified: Secondary | ICD-10-CM | POA: Diagnosis not present

## 2019-11-20 DIAGNOSIS — I5032 Chronic diastolic (congestive) heart failure: Secondary | ICD-10-CM | POA: Diagnosis not present

## 2019-11-24 ENCOUNTER — Encounter: Payer: Self-pay | Admitting: *Deleted

## 2019-11-24 ENCOUNTER — Other Ambulatory Visit: Payer: Self-pay | Admitting: Family Medicine

## 2019-11-24 DIAGNOSIS — I5032 Chronic diastolic (congestive) heart failure: Secondary | ICD-10-CM | POA: Diagnosis not present

## 2019-11-24 DIAGNOSIS — D631 Anemia in chronic kidney disease: Secondary | ICD-10-CM | POA: Diagnosis not present

## 2019-11-24 DIAGNOSIS — I130 Hypertensive heart and chronic kidney disease with heart failure and stage 1 through stage 4 chronic kidney disease, or unspecified chronic kidney disease: Secondary | ICD-10-CM | POA: Diagnosis not present

## 2019-11-24 DIAGNOSIS — K31819 Angiodysplasia of stomach and duodenum without bleeding: Secondary | ICD-10-CM | POA: Diagnosis not present

## 2019-11-24 DIAGNOSIS — J449 Chronic obstructive pulmonary disease, unspecified: Secondary | ICD-10-CM | POA: Diagnosis not present

## 2019-11-24 DIAGNOSIS — N183 Chronic kidney disease, stage 3 unspecified: Secondary | ICD-10-CM | POA: Diagnosis not present

## 2019-11-24 MED ORDER — PANTOPRAZOLE SODIUM 40 MG PO TBEC: 40.0000 mg | DELAYED_RELEASE_TABLET | Freq: Two times a day (BID) | ORAL | 0 refills | Status: DC

## 2019-11-24 NOTE — Telephone Encounter (Signed)
Refill sent to pharmacy.   

## 2019-11-24 NOTE — Telephone Encounter (Signed)
°  Prescription Request  11/24/2019  What is the name of the medication or equipment? pantoprazole (PROTONIX) 40 MG tablet   Have you contacted your pharmacy to request a refill? (if applicable) yes, stated to call office as another provider originally  prescribed the medication.  Which pharmacy would you like this sent to? [facility] In [place]   Patient notified that their request is being sent to the clinical staff for review and that they should receive a response within 2 business days.

## 2019-11-25 ENCOUNTER — Telehealth: Payer: Self-pay

## 2019-11-25 NOTE — Telephone Encounter (Signed)
Left voice mail for [name] pts daughter on dpr to call back about lab work results.

## 2019-11-25 NOTE — Telephone Encounter (Signed)
I called Crystal Cooper pts daughter about lab work for her mom. I stated that patient that lab work for reversible causes of memory loss is all normal except for slightly elevated thyroid hormone and she needs to see primary care physician to discuss management for this. I stated labs will be sent to PCP to evaluate and management. Crystal Cooper verbalized understanding.

## 2019-11-25 NOTE — Telephone Encounter (Signed)
Phone rep checked office voicemail's, at 12:38 pt's daughter has returned the call to [place], Therapist, sports.  Please call

## 2019-11-25 NOTE — Telephone Encounter (Signed)
Left voice mail for Crystal Cooper to call back about lab results.

## 2019-11-25 NOTE — Telephone Encounter (Signed)
Labs or elevated thyroid routed to Dr Livia Snellen for evaluation.

## 2019-11-25 NOTE — Telephone Encounter (Signed)
-----   Message from Garvin Fila, MD sent at 11/17/2019  4:11 PM EDT ----- Mitchell Heir inform the patient that lab work for reversible causes of memory loss is all normal except for slightly elevated thyroid hormone and she needs to see primary care physician to discuss management for this

## 2019-11-26 NOTE — Telephone Encounter (Signed)
Pt daughter robin LVM returning missed call

## 2019-11-27 ENCOUNTER — Telehealth: Payer: Self-pay | Admitting: *Deleted

## 2019-11-27 ENCOUNTER — Encounter: Payer: Self-pay | Admitting: Nurse Practitioner

## 2019-11-27 ENCOUNTER — Other Ambulatory Visit: Payer: Self-pay

## 2019-11-27 ENCOUNTER — Ambulatory Visit (INDEPENDENT_AMBULATORY_CARE_PROVIDER_SITE_OTHER): Payer: Medicare Other | Admitting: Nurse Practitioner

## 2019-11-27 ENCOUNTER — Telehealth: Payer: Self-pay | Admitting: Family Medicine

## 2019-11-27 VITALS — BP 137/70 | HR 67 | Temp 98.0°F | Resp 22 | Ht 61.0 in | Wt 112.0 lb

## 2019-11-27 DIAGNOSIS — I5032 Chronic diastolic (congestive) heart failure: Secondary | ICD-10-CM | POA: Diagnosis not present

## 2019-11-27 DIAGNOSIS — J4520 Mild intermittent asthma, uncomplicated: Secondary | ICD-10-CM

## 2019-11-27 DIAGNOSIS — I130 Hypertensive heart and chronic kidney disease with heart failure and stage 1 through stage 4 chronic kidney disease, or unspecified chronic kidney disease: Secondary | ICD-10-CM | POA: Diagnosis not present

## 2019-11-27 DIAGNOSIS — N183 Chronic kidney disease, stage 3 unspecified: Secondary | ICD-10-CM | POA: Diagnosis not present

## 2019-11-27 DIAGNOSIS — K31819 Angiodysplasia of stomach and duodenum without bleeding: Secondary | ICD-10-CM | POA: Diagnosis not present

## 2019-11-27 DIAGNOSIS — J449 Chronic obstructive pulmonary disease, unspecified: Secondary | ICD-10-CM | POA: Diagnosis not present

## 2019-11-27 DIAGNOSIS — H6121 Impacted cerumen, right ear: Secondary | ICD-10-CM

## 2019-11-27 DIAGNOSIS — H6123 Impacted cerumen, bilateral: Secondary | ICD-10-CM | POA: Insufficient documentation

## 2019-11-27 DIAGNOSIS — D631 Anemia in chronic kidney disease: Secondary | ICD-10-CM | POA: Diagnosis not present

## 2019-11-27 MED ORDER — BREZTRI AEROSPHERE 160-9-4.8 MCG/ACT IN AERO: 2.0000 | INHALATION_SPRAY | Freq: Two times a day (BID) | RESPIRATORY_TRACT | 1 refills | Status: DC

## 2019-11-27 NOTE — Patient Instructions (Signed)
Earwax Buildup, Adult The ears produce a substance called earwax that helps keep bacteria out of the ear and protects the skin in the ear canal. Occasionally, earwax can build up in the ear and cause discomfort or hearing loss. What increases the risk? This condition is more likely to develop in people who:  Are female.  Are elderly.  Naturally produce more earwax.  Clean their ears often with cotton swabs.  Use earplugs often.  Use in-ear headphones often.  Wear hearing aids.  Have narrow ear canals.  Have earwax that is overly thick or sticky.  Have eczema.  Are dehydrated.  Have excess hair in the ear canal. What are the signs or symptoms? Symptoms of this condition include:  Reduced or muffled hearing.  A feeling of fullness in the ear or feeling that the ear is plugged.  Fluid coming from the ear.  Ear pain.  Ear itch.  Ringing in the ear.  Coughing.  An obvious piece of earwax that can be seen inside the ear canal. How is this diagnosed? This condition may be diagnosed based on:  Your symptoms.  Your medical history.  An ear exam. During the exam, your health care provider will look into your ear with an instrument called an otoscope. You may have tests, including a hearing test. How is this treated? This condition may be treated by:  Using ear drops to soften the earwax.  Having the earwax removed by a health care provider. The health care provider may: ? Flush the ear with water. ? Use an instrument that has a loop on the end (curette). ? Use a suction device.  Surgery to remove the wax buildup. This may be done in severe cases. Follow these instructions at home:   Take over-the-counter and prescription medicines only as told by your health care provider.  Do not put any objects, including cotton swabs, into your ear. You can clean the opening of your ear canal with a washcloth or facial tissue.  Follow instructions from your health care  provider about cleaning your ears. Do not over-clean your ears.  Drink enough fluid to keep your urine clear or pale yellow. This will help to thin the earwax.  Keep all follow-up visits as told by your health care provider. If earwax builds up in your ears often or if you use hearing aids, consider seeing your health care provider for routine, preventive ear cleanings. Ask your health care provider how often you should schedule your cleanings.  If you have hearing aids, clean them according to instructions from the manufacturer and your health care provider. Contact a health care provider if:  You have ear pain.  You develop a fever.  You have blood, pus, or other fluid coming from your ear.  You have hearing loss.  You have ringing in your ears that does not go away.  Your symptoms do not improve with treatment.  You feel like the room is spinning (vertigo). Summary  Earwax can build up in the ear and cause discomfort or hearing loss.  The most common symptoms of this condition include reduced or muffled hearing and a feeling of fullness in the ear or feeling that the ear is plugged.  This condition may be diagnosed based on your symptoms, your medical history, and an ear exam.  This condition may be treated by using ear drops to soften the earwax or by having the earwax removed by a health care provider.  Do not put any   objects, including cotton swabs, into your ear. You can clean the opening of your ear canal with a washcloth or facial tissue. This information is not intended to replace advice given to you by your health care provider. Make sure you discuss any questions you have with your health care provider. Document Revised: 05/24/2017 Document Reviewed: 08/22/2016 Elsevier Patient Education  2020 [employer]  

## 2019-11-27 NOTE — Telephone Encounter (Signed)
VM from [facility] w/ [facility] Pt was started on new medication Breztri aerosphere BP today was elevated from her normal 142/70 and 155/76 her normal is around 110-120 and a little bit more wheezing

## 2019-11-27 NOTE — Assessment & Plan Note (Addendum)
Patient is a 86 year old female who presents with right ear wax build up. Ear wax build up  is not well controlled, patient has used deborox at home in the last week or two, wax is soft but still covering the visibility of the ear drums. Ear is flushed and patent.

## 2019-11-27 NOTE — Assessment & Plan Note (Addendum)
Patients Asthma is well controled on current medication, patient reports that she wants a change from [facility] because of the size of medication container and easy handling. Medication switched out to [facility] [phone] provided patient with samples. Patient will inform clinic if new medication is better than the last one.

## 2019-11-27 NOTE — Progress Notes (Signed)
Established Patient Office Visit  Subjective:  Patient ID: [facility], female    DOB: 1927/05/18  Age: 86 y.o. MRN: [id number]  CC:  Chief Complaint  Patient presents with  . right ear stopped up    HPI Crystal Cooper presents for cerumen build up in the right ear. This is not new for patient, patients daughter reports that deborox have been used prohylactically at home in the last week or two. Patient is not reporting worsening hearing loss, dizziness or muffled speech.     Past Medical History:  Diagnosis Date  . AKI (acute kidney injury) ([place])   . Anemia    years ago  . Aortic stenosis   . Arthritis   . Asthma   . Atrial fibrillation ([place])   . CHF (congestive heart failure) ([place]) 11/2014  . CKD (chronic kidney disease)   . Family history of adverse reaction to anesthesia    2 daughters would have N/V  . Gastric AVM   . GI bleed   . Glaucoma   . Hearing loss   . Heart murmur   . HTN (hypertension)   . Hypokalemia   . Pneumonia   . Prolapsing mitral leaflet syndrome   . Shortness of breath dyspnea    with exertion  . Stroke ([place])   . SVT (supraventricular tachycardia) ([facility])    S/P ablation of AVNRT in 2003    Past Surgical History:  Procedure Laterality Date  . APPENDECTOMY    . BIOPSY  04/07/2018   Procedure: BIOPSY;  Surgeon: Jerene Bears, MD;  Location: [facility] ENDOSCOPY;  Service: Gastroenterology;;  . BLADDER SURGERY    . CARDIAC CATHETERIZATION N/A 09/26/2015   Procedure: Right/Left Heart Cath and Coronary Angiography;  Surgeon: Burnell Blanks, MD;  Location: [place] CV LAB;  Service: Cardiovascular;  Laterality: N/A;  . CARDIAC SURGERY    . CARDIOVERSION N/A 03/02/2016   Procedure: CARDIOVERSION;  Surgeon: Thayer Headings, MD;  Location: [place];  Service: Cardiovascular;  Laterality: N/A;  . EP IMPLANTABLE DEVICE N/A 10/26/2015   Procedure: Pacemaker Implant;  Surgeon: Will Meredith Leeds, MD;  Location: [place] CV LAB;  Service:  Cardiovascular;  Laterality: N/A;  . ESOPHAGOGASTRODUODENOSCOPY (EGD) WITH PROPOFOL N/A 04/07/2018   Procedure: ESOPHAGOGASTRODUODENOSCOPY (EGD) WITH PROPOFOL;  Surgeon: Jerene Bears, MD;  Location: [facility] ENDOSCOPY;  Service: Gastroenterology;  Laterality: N/A;  . ESOPHAGOGASTRODUODENOSCOPY (EGD) WITH PROPOFOL N/A 10/12/2019   Procedure: ESOPHAGOGASTRODUODENOSCOPY (EGD) WITH PROPOFOL;  Surgeon: Danie Binder, MD;  [facility] ENDO SU[facility];  Service: Endoscopy;  Laterality: N/A;  . EYE SURGERY Bilateral    cataract surgery  . HOT HEMOSTASIS N/A 04/07/2018   Procedure: HOT HEMOSTASIS (ARGON PLASMA COAGULATION/BICAP);  Surgeon: Jerene Bears, MD;  Location: [facility] ENDOSCOPY;  Service: Gastroenterology;  Laterality: N/A;  . HOT HEMOSTASIS  10/12/2019   Procedure: HOT HEMOSTASIS (ARGON PLASMA COAGULATION/BICAP);  Surgeon: Danie Binder, MD;  Location: [facility] ENDO [facility]ITE;  Service: Endoscopy;;  . NASAL SINUS SURGERY    . PACEMAKER INSERTION    . TEE WITHOUT CARDIOVERSION N/A 10/25/2015   Procedure: TRANSESOPHAGEAL ECHOCARDIOGRAM (TEE);  Surgeon: Burnell Blanks, MD;  Location: [place];  Service: Open Heart Surgery;  Laterality: N/A;  . TRANSCATHETER AORTIC VALVE REPLACEMENT, TRANSFEMORAL Right 10/25/2015   Procedure: TRANSCATHETER AORTIC VALVE REPLACEMENT, TRANSFEMORAL;  Surgeon: Burnell Blanks, MD;  Location: [place];  Service: Open Heart Surgery;  Laterality: Right;    Family History  Problem Relation Age of  Onset  . Hypertension Mother   . Pneumonia Father   . Heart attack Neg Hx   . Colon cancer Neg Hx   . Esophageal cancer Neg Hx     Social History   Socioeconomic History  . Marital status: Widowed    Spouse name: Not on file  . Number of children: 3  . Years of education: Not on file  . Highest education level: 12th grade  Occupational History  . Occupation: Retired  Tobacco Use  . Smoking status: Never Smoker  . Smokeless tobacco: Never Used  Substance and Sexual Activity  .  Alcohol use: No    Alcohol/week: 0.0 standard drinks  . Drug use: No  . Sexual activity: Not Currently  Other Topics Concern  . Not on file  Social History Narrative  . Not on file   Social Determinants of Health   Financial Resource Strain: Low Risk   . Difficulty of Paying Living Expenses: Not hard at all  Food Insecurity: No Food Insecurity  . Worried About Charity fundraiser in the Last Year: Never true  . Ran Out of Food in the Last Year: Never true  Transportation Needs: No Transportation Needs  . Lack of Transportation (Medical): No  . Lack of Transportation (Non-Medical): No  Physical Activity: Inactive  . Days of Exercise per Week: 0 days  . Minutes of Exercise per Session: 0 min  Stress: No Stress Concern Present  . Feeling of Stress : Not at all  Social Connections: Slightly Isolated  . Frequency of Communication with Friends and Family: More than three times a week  . Frequency of Social Gatherings with Friends and Family: More than three times a week  . Attends Religious Services: More than 4 times per year  . Active Member of Clubs or Organizations: Yes  . Attends Archivist Meetings: More than 4 times per year  . Marital Status: Widowed  Intimate Partner Violence: Not At Risk  . Fear of Current or Ex-Partner: No  . Emotionally Abused: No  . Physically Abused: No  . Sexually Abused: No    Outpatient Medications Prior to Visit  Medication Sig Dispense Refill  . acetaminophen (TYLENOL) 500 MG tablet Take 500 mg by mouth every 6 (six) hours as needed for mild pain or headache.     . albuterol (PROAIR HFA) 108 (90 Base) MCG/ACT inhaler Inhale 2 puffs into the lungs every 4 (four) hours as needed for wheezing or shortness of breath. 18 g 11  . Biotin 10 MG CAPS Take 10 mg by mouth daily.     . Calcium Carb-Cholecalciferol (CALTRATE 600+D3) 600-800 MG-UNIT TABS Take 1 tablet by mouth daily.    [name] docusate sodium (COLACE) 100 MG capsule Take 100 mg by  mouth daily as needed for mild constipation.    . famotidine (PEPCID) 20 MG tablet Take 1 tablet (20 mg total) by mouth at bedtime. 30 tablet 1  . ferrous sulfate 325 (65 FE) MG tablet Take 1 tablet (325 mg total) by mouth 2 (two) times daily with a meal.  3  . fluticasone furoate-vilanterol (BREO ELLIPTA) 100-25 MCG/INH AEPB Inhale 1 puff into the lungs daily. 60 each 11  . furosemide (LASIX) 40 MG tablet Take 1 tablet (40 mg total) by mouth daily. 30 tablet 2  . memantine (NAMENDA) 10 MG tablet Take 1 tablet (10 mg total) by mouth 2 (two) times daily. 60 tablet 0  . memantine (NAMENDA) 5 MG tablet Take  5mg  daily for one week Take 5mg  in the am and 5mg  in the pm for second week Take 5mg  in the am and 10mg  in the pm for third week 42 tablet 0  . metoprolol tartrate (LOPRESSOR) 25 MG tablet Take 1 tablet (25 mg total) by mouth 2 (two) times daily. 180 tablet 3  . multivitamin-iron-minerals-folic acid (CENTRUM) chewable tablet Chew 1 tablet by mouth daily.    [name] OVER THE COUNTER MEDICATION Take 0.5 tablets by mouth daily as needed (for allergy). Patient not sure about the name.    . pantoprazole (PROTONIX) 40 MG tablet Take 1 tablet (40 mg total) by mouth 2 (two) times daily before a meal. 60 tablet 0  . sulfamethoxazole-trimethoprim (BACTRIM DS) 800-160 MG tablet Take 1 tablet by mouth 2 (two) times daily. 20 tablet 0   No facility-administered medications prior to visit.    Allergies  Allergen Reactions  . Hctz [Hydrochlorothiazide] Other (See Comments)    Pt was ill and this affected her kidneys   . Aspirin Other (See Comments)    Cardiologist said the patient is to not take this  . Codeine Other (See Comments)    Made the patient feel ill, has not had any problems since 1977    ROS Review of Systems  Constitutional: Negative.   HENT: Positive for hearing loss. Negative for congestion, ear discharge, ear pain and facial swelling.        Right ear wax  Eyes: Negative.   Respiratory:  Positive for wheezing.   Cardiovascular: Negative for chest pain and palpitations.  Gastrointestinal: Negative.   Genitourinary: Negative.   Neurological: Negative for light-headedness and headaches.  Psychiatric/Behavioral: The patient is not nervous/anxious.       Objective:    Physical Exam  Constitutional: She is oriented to person, place, and time. She appears well-developed and well-nourished.  HENT:  Head: Normocephalic.  Mouth/Throat: Oropharynx is clear and moist.  Right ear wax build up  Eyes: Conjunctivae are normal.  Cardiovascular: Normal rate, regular rhythm and normal heart sounds.  Pulmonary/Chest: She has wheezes. She exhibits no tenderness.  Abdominal: Bowel sounds are normal.  Musculoskeletal:     Cervical back: Neck supple.  Neurological: She is alert and oriented to person, place, and time.  Skin: Skin is dry. No rash noted.    Resp (!) 22   Ht 5\' 1"  (1.549 m)   Wt 112 lb (50.8 kg)   BMI 21.16 kg/m  Wt Readings from Last 3 Encounters:  11/27/19 112 lb (50.8 kg)  11/13/19 115 lb 2 oz (52.2 kg)  11/11/19 112 lb (50.8 kg)       Lab Results  Component Value Date   TSH 4.680 (H) 11/11/2019   Lab Results  Component Value Date   WBC 7.5 10/19/2019   HGB 9.1 (L) 10/19/2019   HCT 27.8 (L) 10/19/2019   MCV 96 10/19/2019   PLT 275 10/19/2019   Lab Results  Component Value Date   NA 136 10/19/2019   K 4.4 10/19/2019   CO2 25 10/19/2019   GLUCOSE 95 10/19/2019   BUN 19 10/19/2019   CREATININE 1.38 (H) 10/19/2019   BILITOT 0.3 10/19/2019   ALKPHOS 86 10/19/2019   AST 30 10/19/2019   ALT 22 10/19/2019   PROT 6.3 10/19/2019   ALBUMIN 3.7 10/19/2019   CALCIUM 9.0 10/19/2019   ANIONGAP 8 10/15/2019   Lab Results  Component Value Date   CHOL 129 09/29/2019   Lab Results  Component  Value Date   HDL 59 09/29/2019   Lab Results  Component Value Date   LDLCALC 51 09/29/2019   Lab Results  Component Value Date   TRIG 101 09/29/2019    Lab Results  Component Value Date   CHOLHDL 2.2 09/29/2019   Lab Results  Component Value Date   HGBA1C 5.3 08/27/2019      Assessment & Plan:   Problem List Items Addressed This Visit      Respiratory   Reactive airway disease    Patients Asthma is well controled on current medication, patient reports that she wants a change from [facility] because of the size of medication container and easy handling. Medication switched out to [facility] [phone] provided patient with samples. Patient will inform clinic if new medication is better than the last one.        Nervous and Auditory   Bilateral impacted cerumen - Primary    Patient is a 86 year old female who presents with right ear wax build up. Ear wax build up  is not well controlled, patient has used deborox at home in the last week or two, wax is soft but still covering the visibility of the ear drums. Ear is flushed and patent.         Meds ordered this encounter  Medications  . Budeson-Glycopyrrol-Formoterol (BREZTRI AEROSPHERE) 160-9-4.8 MCG/ACT AERO    Sig: Inhale 2 Inhalers into the lungs in the morning and at bedtime.    Dispense:  5.9 g    Refill:  1    2 box samples given    Follow-up: Return if symptoms worsen or fail to improve.    Ivy Lynn, NP

## 2019-11-27 NOTE — Telephone Encounter (Signed)
Daughter states that the [facility] was to send over lab results to [place] that states pts thyroid is overactive and for the pcp to advise treatment. Daughter is wanting to see if this was received.

## 2019-11-27 NOTE — Telephone Encounter (Signed)
Pt daughter called and aware that we do not see records yet - I attempted to call [facility] = closed early.  Will call [facility] for records

## 2019-11-30 ENCOUNTER — Telehealth: Payer: Self-pay | Admitting: *Deleted

## 2019-11-30 NOTE — Telephone Encounter (Signed)
Pt had labwork from Dr. Leonie Man, a Dementia panel, which has an abnormal TSH level.(which I am able to view in her chart) Dr. Leonie Man would like her PCP to evaluate and treat. Please advise and call her daughter

## 2019-11-30 NOTE — Telephone Encounter (Signed)
Appt made.  Daughter aware

## 2019-11-30 NOTE — Telephone Encounter (Signed)
Results in epic and note has already been sent to DR [facility]

## 2019-11-30 NOTE — Telephone Encounter (Signed)
Pt. Needs to be seen for this. Thanks, WS 

## 2019-12-01 ENCOUNTER — Ambulatory Visit (INDEPENDENT_AMBULATORY_CARE_PROVIDER_SITE_OTHER): Payer: Medicare Other | Admitting: Family Medicine

## 2019-12-01 ENCOUNTER — Encounter: Payer: Self-pay | Admitting: Family Medicine

## 2019-12-01 ENCOUNTER — Other Ambulatory Visit: Payer: Self-pay

## 2019-12-01 VITALS — BP 136/66 | HR 75 | Temp 96.6°F | Resp 20 | Ht 61.0 in | Wt 114.0 lb

## 2019-12-01 DIAGNOSIS — Z7901 Long term (current) use of anticoagulants: Secondary | ICD-10-CM | POA: Diagnosis not present

## 2019-12-01 DIAGNOSIS — R7989 Other specified abnormal findings of blood chemistry: Secondary | ICD-10-CM

## 2019-12-01 DIAGNOSIS — R6889 Other general symptoms and signs: Secondary | ICD-10-CM | POA: Diagnosis not present

## 2019-12-01 DIAGNOSIS — H9041 Sensorineural hearing loss, unilateral, right ear, with unrestricted hearing on the contralateral side: Secondary | ICD-10-CM | POA: Diagnosis not present

## 2019-12-01 DIAGNOSIS — I6930 Unspecified sequelae of cerebral infarction: Secondary | ICD-10-CM | POA: Diagnosis not present

## 2019-12-01 DIAGNOSIS — I480 Paroxysmal atrial fibrillation: Secondary | ICD-10-CM | POA: Diagnosis not present

## 2019-12-01 NOTE — Progress Notes (Signed)
Subjective:  Patient ID: Crystal Cooper, female    DOB: June 15, 1927  Age: 86 y.o. MRN: [id number]  CC: Discuss TSH labwork done at Dr. Clydene Fake office   HPI Crystal Cooper presents for elevated TSH recently with Dr. Leonie Man.  We discussed the symptoms of thyroid disease.  She does not feel excessively cold she has not had hair loss constipation or myxedema.  At 86 years of age she it is hard to tell if she has a loss of energy or not.  Titrating Namenda.  She has been diagnosed with dementia.  Currently she is being titrated by her specialist.  Also taking Eliquis again.  This is a result of her atrial fibrillation.  She is not having problem with chest pain or shortness of breath.  However her memory is questionable at this time.  She is here with her daughter who confirms that she is asymptomatic.  BP up 90 min after first dose of Breztri.  She was started on this medicine recently due to her COPD.  Unfortunately her blood pressure went up significantly so they did not continue the medicine.  They want advice as to how to proceed at this point based on her COPD and blood pressure combination problem.  Depression screen [facility] 2/9 12/01/2019 11/27/2019 10/27/2019  Decreased Interest 0 0 0  Down, Depressed, Hopeless 0 0 0  PHQ - 2 Score 0 0 0  Altered sleeping - - -  Tired, decreased energy - - -  Change in appetite - - -  Feeling bad or failure about yourself  - - -  Trouble concentrating - - -  Moving slowly or fidgety/restless - - -  Suicidal thoughts - - -  PHQ-9 Score - - -  Difficult doing work/chores - - -  Some recent data might be hidden    History Crystal Cooper has a past medical history of AKI (acute kidney injury) ([place]), Anemia, Aortic stenosis, Arthritis, Asthma, Atrial fibrillation ([place]), CHF (congestive heart failure) ([place]) (11/2014), CKD (chronic kidney disease), Family history of adverse reaction to anesthesia, Gastric AVM, GI bleed, Glaucoma, Hearing loss, Heart murmur, HTN  (hypertension), Hypokalemia, Pneumonia, Prolapsing mitral leaflet syndrome, Shortness of breath dyspnea, Stroke ([place]), and SVT (supraventricular tachycardia) ([place]).   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   Her family history includes Hypertension in her mother; Pneumonia in her father.She reports that she has never smoked. She has never used smokeless tobacco. She reports that she does not drink alcohol or use drugs.    ROS Review of Systems  Constitutional: Negative.  Negative for activity change and appetite change.  HENT: Positive for hearing loss.   Eyes: Negative for visual disturbance.  Respiratory: Negative for shortness of breath.   Cardiovascular: Negative for chest pain.  Gastrointestinal: Negative for abdominal pain.  Endocrine: Negative for cold intolerance and heat intolerance.  Musculoskeletal: Negative for arthralgias.    Objective:  BP 136/66   Pulse 75   Temp (!) 96.6 F (35.9 C) (Temporal)   Resp 20   Ht 5' 1"  (1.549 m)   Wt 114 lb (51.7 kg)   SpO2 98%   BMI  21.54 kg/m   BP Readings from Last 3 Encounters:  12/01/19 136/66  11/27/19 137/70  11/13/19 (!) 118/59    Wt Readings from Last 3 Encounters:  12/01/19 114 lb (51.7 kg)  11/27/19 112 lb (50.8 kg)  11/13/19 115 lb 2 oz (52.2 kg)     Physical Exam Constitutional:      General: She is not in acute distress.    Appearance: She is  well-developed.  Cardiovascular:     Rate and Rhythm: Normal rate and regular rhythm.  Pulmonary:     Breath sounds: Normal breath sounds.  Skin:    General: Skin is warm and dry.     Coloration: Skin is jaundiced.  Neurological:     Mental Status: She is alert and oriented to person, place, and time.       Assessment & Plan:   Crystal Cooper was seen today for discuss tsh labwork done at dr. Clydene Fake office.  Diagnoses and all orders for this visit:  Abnormal thyroid blood test -     TSH + free T4 -     CBC with Differential/Platelet -     CMP14+EGFR  Long term (current) use of anticoagulants  Sensorineural hearing loss (SNHL) of right ear with unrestricted hearing of left ear  PAF (paroxysmal atrial fibrillation) ([place])       I have discontinued Kielee W. Bilello's Breo Ellipta and sulfamethoxazole-trimethoprim. I am also having her maintain her multivitamin-iron-minerals-folic acid, acetaminophen, Biotin, docusate sodium, ferrous sulfate, metoprolol tartrate, albuterol, Caltrate 600+D3, famotidine, OVER THE COUNTER MEDICATION, furosemide, memantine, memantine, pantoprazole, and Breztri Aerosphere.  Allergies as of 12/01/2019      Reactions   Hctz [hydrochlorothiazide] Other (See Comments)   Pt was ill and this affected her kidneys   Aspirin Other (See Comments)   Cardiologist said the patient is to not take this   Codeine Other (See Comments)   Made the patient feel ill, has not had any problems since 1977      Medication List       Accurate as of December 01, 2019 10:03 PM. If you have any questions, ask your nurse or doctor.        STOP taking these medications   Breo Ellipta 100-25 MCG/INH Aepb Generic drug: fluticasone furoate-vilanterol Stopped by: [name], MD   sulfamethoxazole-trimethoprim 800-160 MG tablet Commonly known as: Bactrim DS Stopped by: [name], MD     TAKE these medications   acetaminophen 500 MG tablet Commonly known as:  TYLENOL Take 500 mg by mouth every 6 (six) hours as needed for mild pain or headache.   albuterol 108 (90 Base) MCG/ACT inhaler Commonly known as: ProAir HFA Inhale 2 puffs into the lungs every 4 (four) hours as needed for wheezing or shortness of breath.   Biotin 10 MG Caps Take 10 mg by mouth daily.   Breztri Aerosphere 160-9-4.8 MCG/ACT Aero Generic drug: Budeson-Glycopyrrol-Formoterol Inhale 2 Inhalers into the lungs in the morning and at bedtime.   Caltrate 600+D3 600-800 MG-UNIT Tabs Generic drug: Calcium Carb-Cholecalciferol Take 1 tablet by mouth daily.   docusate sodium 100 MG capsule Commonly known as: COLACE Take 100 mg by mouth daily as needed for mild constipation.   famotidine 20 MG tablet Commonly known as: Pepcid Take 1 tablet (20 mg total) by mouth at bedtime.   ferrous sulfate 325 (65 FE) MG tablet Take 1 tablet (325 mg total) by mouth 2 (two) times daily with a meal.   furosemide 40 MG tablet  Commonly known as: LASIX Take 1 tablet (40 mg total) by mouth daily.   memantine 5 MG tablet Commonly known as: NAMENDA Take 67m daily for one week Take 527min the am and 24m83mn the pm for second week Take 24mg76m the am and 10mg47mthe pm for third week   memantine 10 MG tablet Commonly known as: NAMENDA Take 1 tablet (10 mg total) by mouth 2 (two) times daily.   metoprolol tartrate 25 MG tablet Commonly known as: LOPRESSOR Take 1 tablet (25 mg total) by mouth 2 (two) times daily.   multivitamin-iron-minerals-folic acid chewable tablet Chew 1 tablet by mouth daily.   OVER THE COUNTER MEDICATION Take 0.5 tablets by mouth daily as needed (for allergy). Patient not sure about the name.   pantoprazole 40 MG tablet Commonly known as: PROTONIX Take 1 tablet (40 mg total) by mouth 2 (two) times daily before a meal.        Follow-up: No follow-ups on file.  WarreClaretta Fraise.

## 2019-12-02 ENCOUNTER — Telehealth: Payer: Self-pay | Admitting: Neurology

## 2019-12-02 ENCOUNTER — Other Ambulatory Visit: Payer: Self-pay | Admitting: Family Medicine

## 2019-12-02 ENCOUNTER — Ambulatory Visit (HOSPITAL_COMMUNITY)
Admission: RE | Admit: 2019-12-02 | Discharge: 2019-12-02 | Disposition: A | Discharge status: Home / Self Care | Payer: Medicare Other | Source: Ambulatory Visit | Attending: Neurology | Admitting: Neurology

## 2019-12-02 ENCOUNTER — Telehealth: Payer: Self-pay

## 2019-12-02 DIAGNOSIS — I63511 Cerebral infarction due to unspecified occlusion or stenosis of right middle cerebral artery: Secondary | ICD-10-CM | POA: Insufficient documentation

## 2019-12-02 DIAGNOSIS — F0151 Vascular dementia with behavioral disturbance: Secondary | ICD-10-CM | POA: Insufficient documentation

## 2019-12-02 DIAGNOSIS — F01518 Vascular dementia, unspecified severity, with other behavioral disturbance: Secondary | ICD-10-CM

## 2019-12-02 DIAGNOSIS — G9389 Other specified disorders of brain: Secondary | ICD-10-CM | POA: Diagnosis not present

## 2019-12-02 DIAGNOSIS — G309 Alzheimer's disease, unspecified: Secondary | ICD-10-CM | POA: Insufficient documentation

## 2019-12-02 LAB — CMP14+EGFR
ALT: 16 IU/L (ref 0–32)
AST: 25 IU/L (ref 0–40)
Albumin/Globulin Ratio: 1.8 (ref 1.2–2.2)
Albumin: 4 g/dL (ref 3.5–4.6)
Alkaline Phosphatase: 75 IU/L (ref 48–121)
BUN/Creatinine Ratio: 18 (ref 12–28)
BUN: 28 mg/dL (ref 10–36)
Bilirubin Total: 0.3 mg/dL (ref 0.0–1.2)
CO2: 27 mmol/L (ref 20–29)
Calcium: 8.7 mg/dL (ref 8.7–10.3)
Chloride: 97 mmol/L (ref 96–106)
Creatinine, Ser: 1.53 mg/dL — ABNORMAL HIGH (ref 0.57–1.00)
GFR calc Af Amer: 34 mL/min/{1.73_m2} — ABNORMAL LOW (ref >59)
GFR calc non Af Amer: 30 mL/min/{1.73_m2} — ABNORMAL LOW (ref >59)
Globulin, Total: 2.2 g/dL (ref 1.5–4.5)
Glucose: 94 mg/dL (ref 65–99)
Potassium: 4 mmol/L (ref 3.5–5.2)
Sodium: 137 mmol/L (ref 134–144)
Total Protein: 6.2 g/dL (ref 6.0–8.5)

## 2019-12-02 LAB — CBC WITH DIFFERENTIAL/PLATELET
Basophils Absolute: 0.1 10*3/uL (ref 0.0–0.2)
Basos: 2 %
EOS (ABSOLUTE): 0.1 10*3/uL (ref 0.0–0.4)
Eos: 2 %
Hematocrit: 27.3 % — ABNORMAL LOW (ref 34.0–46.6)
Hemoglobin: 8.8 g/dL — CL (ref 11.1–15.9)
Immature Grans (Abs): 0 10*3/uL (ref 0.0–0.1)
Immature Granulocytes: 0 %
Lymphocytes Absolute: 1 10*3/uL (ref 0.7–3.1)
Lymphs: 19 %
MCH: 30.4 pg (ref 26.6–33.0)
MCHC: 32.2 g/dL (ref 31.5–35.7)
MCV: 95 fL (ref 79–97)
Monocytes Absolute: 0.5 10*3/uL (ref 0.1–0.9)
Monocytes: 10 %
Neutrophils Absolute: 3.4 10*3/uL (ref 1.4–7.0)
Neutrophils: 67 %
Platelets: 227 10*3/uL (ref 150–450)
RBC: 2.89 x10E6/uL — ABNORMAL LOW (ref 3.77–5.28)
RDW: 13.1 % (ref 11.7–15.4)
WBC: 5.1 10*3/uL (ref 3.4–10.8)

## 2019-12-02 LAB — TSH+FREE T4
Free T4: 1.41 ng/dL (ref 0.82–1.77)
TSH: 6.54 u[IU]/mL — ABNORMAL HIGH (ref 0.450–4.500)

## 2019-12-02 MED ORDER — LEVOTHYROXINE SODIUM 25 MCG PO TABS
25.0000 ug | ORAL_TABLET | Freq: Every day | ORAL | 2 refills | Status: DC
Start: 2019-12-02 — End: 2020-03-04

## 2019-12-02 MED ORDER — GADOBUTROL 1 MMOL/ML IV SOLN
5.0000 mL | Freq: Once | INTRAVENOUS | Status: AC | PRN
  Administered 2019-12-02: 5 mL via INTRAVENOUS

## 2019-12-02 NOTE — Telephone Encounter (Signed)
[name] called back needing to speak to the RN again. Please call back when available.

## 2019-12-02 NOTE — Telephone Encounter (Signed)
I called [name] back and she has call the  Pharmacy and pt is on the right dosage on titration pack. Pt is on her last week of pack and will start the 10mg  memantine next week.

## 2019-12-02 NOTE — Telephone Encounter (Signed)
Pt is on the memantine (NAMENDA) 5 MG tablet and is about to run out and is wanting to know if she can get the memantine (NAMENDA) 10 MG tablet instead of the 5mg   Pt is needing this sent in to the [facility] in [place]

## 2019-12-02 NOTE — Telephone Encounter (Signed)
I called Crystal Cooper that pt call about her medication and should be still on the memantine titration pack. I stated pt starter pack should not be done till 12/13/2019. I stated a prescription of 10mg  memantine is already on hold at the pharmacy. Crystal Cooper will call her mom and go see her to clarify what she is taking. She verbalized understanding.

## 2019-12-02 NOTE — Progress Notes (Signed)
Informed of MRI for today.   Device system confirmed to be MRI conditional, with implant date > 6 weeks ago and no evidence of abandoned or epicardial leads in review of most recent CXR Interrogation from today reviewed, pt is currently Afib/flutter-VP at 65 bpm Change device settings for MRI to VOO at 85 bpm  Tachy-therapies to off if applicable.  Program device back to pre-MRI settings after completion of exam.  Shirley Friar, PA-C  12/02/2019 1:03 PM

## 2019-12-02 NOTE — Progress Notes (Signed)
Per orders pacemaker placed in MRI sure scan mode at VOO 85.  Will reset to prescan values after MRI

## 2019-12-02 NOTE — Telephone Encounter (Signed)
Per Dr. Livia Snellen, daughter aware of critical lab value for hemoglobin and labs forwarded to Dr. Antony Contras for treatment.

## 2019-12-03 ENCOUNTER — Other Ambulatory Visit: Payer: Self-pay | Admitting: *Deleted

## 2019-12-03 DIAGNOSIS — R7989 Other specified abnormal findings of blood chemistry: Secondary | ICD-10-CM

## 2019-12-07 ENCOUNTER — Ambulatory Visit (INDEPENDENT_AMBULATORY_CARE_PROVIDER_SITE_OTHER): Payer: Medicare Other | Admitting: Licensed Clinical Social Worker

## 2019-12-07 DIAGNOSIS — J45909 Unspecified asthma, uncomplicated: Secondary | ICD-10-CM

## 2019-12-07 DIAGNOSIS — M810 Age-related osteoporosis without current pathological fracture: Secondary | ICD-10-CM

## 2019-12-07 DIAGNOSIS — I5032 Chronic diastolic (congestive) heart failure: Secondary | ICD-10-CM

## 2019-12-07 DIAGNOSIS — I10 Essential (primary) hypertension: Secondary | ICD-10-CM

## 2019-12-07 DIAGNOSIS — K219 Gastro-esophageal reflux disease without esophagitis: Secondary | ICD-10-CM

## 2019-12-07 DIAGNOSIS — N1831 Chronic kidney disease, stage 3a: Secondary | ICD-10-CM

## 2019-12-07 DIAGNOSIS — I639 Cerebral infarction, unspecified: Secondary | ICD-10-CM

## 2019-12-07 NOTE — Patient Instructions (Addendum)
Licensed Clinical Social Worker Visit Information  Goals we discussed today:  Goals    .  Client will communicate with LCSW in next 30 days to talk about ADLs completion and challenges in doing daily activities of clinet (pt-stated)        Current Barriers:  . Challenges with ADLs completion in client with Chronic Diagnoses of  HTN< CKD, Hyponatremia, GERD, CHF . Some memory challenges  Clinical Social Work Clinical Goal(s):  [name] LCSW will communicate with client in next 30 days to talk with client about ADLs completion and challenges and doing daily activities of client  Interventions:   Talked with Marin Roberts, daughter of client about client's ADLs completion daily  Talked with [name] about social support network of client   Talked with [name] about in home care needs of client  Talked with [name] about home health support for client with ADTS and with [place]   Provided counseling support for Marin Roberts related to family conflict issues regarding patient care in the home  Patient Self Care Activities:  Attends scheduled medical appointments  . SELF CARE DEFICITS: Challenges in ADLs completion Some mobility challenges   Initial goal documentation    Materials Provided: No  Follow Up Plan:LCSW to call client/daughter, [name],in next 4 weeks to talk with client /daughterabout daily ADLs completion of client and about challenges in doing daily activities.  The patient/[name] Burroughs, daughter, verbalized understanding of instructions provided today and declined a print copy of patient instruction materials.   [name].Alvie Speltz MSW, LCSW Licensed Clinical Social Worker [facility] [phone]

## 2019-12-07 NOTE — Chronic Care Management (AMB) (Signed)
Chronic Care Management    Clinical Social Work Follow Up Note  12/07/2019 Name: Crystal Cooper MRN: [id number] DOB: 06-19-1927  Crystal Cooper is a 86 y.o. year old female who is a primary care patient of Stacks, [name], MD. The CCM team was consulted for assistance with [employer] .   Review of patient status, including review of consultants reports, other relevant assessments, and collaboration with appropriate care team members and the patient's provider was performed as part of comprehensive patient evaluation and provision of chronic care management services.    SDOH (Social Determinants of Health) assessments performed: No; risk for physical inactivity; risk for depression; risk for stress    Chronic Care Management from 09/11/2019 in [place]  PHQ-9 Total Score 5     GAD 7 : Generalized Anxiety Score 09/11/2019  Nervous, Anxious, on Edge 1  Control/stop worrying 1  Worry too much - different things 1  Trouble relaxing 0  Restless 0  Easily annoyed or irritable 0  Afraid - awful might happen 0  Total GAD 7 Score 3  Anxiety Difficulty Somewhat difficult    Outpatient Encounter Medications as of 12/07/2019  Medication Sig  . acetaminophen (TYLENOL) 500 MG tablet Take 500 mg by mouth every 6 (six) hours as needed for mild pain or headache.   . albuterol (PROAIR HFA) 108 (90 Base) MCG/ACT inhaler Inhale 2 puffs into the lungs every 4 (four) hours as needed for wheezing or shortness of breath.  . Biotin 10 MG CAPS Take 10 mg by mouth daily.   . Budeson-Glycopyrrol-Formoterol (BREZTRI AEROSPHERE) 160-9-4.8 MCG/ACT AERO Inhale 2 Inhalers into the lungs in the morning and at bedtime.  . Calcium Carb-Cholecalciferol (CALTRATE 600+D3) 600-800 MG-UNIT TABS Take 1 tablet by mouth daily.  [name] docusate sodium (COLACE) 100 MG capsule Take 100 mg by mouth daily as needed for mild constipation.  . famotidine (PEPCID) 20 MG tablet Take 1 tablet (20 mg total) by  mouth at bedtime.  . ferrous sulfate 325 (65 FE) MG tablet Take 1 tablet (325 mg total) by mouth 2 (two) times daily with a meal.  . furosemide (LASIX) 40 MG tablet Take 1 tablet (40 mg total) by mouth daily.  [name] levothyroxine (SYNTHROID) 25 MCG tablet Take 1 tablet (25 mcg total) by mouth daily.  . memantine (NAMENDA) 10 MG tablet Take 1 tablet (10 mg total) by mouth 2 (two) times daily. (Patient not taking: Reported on 12/01/2019)  . memantine (NAMENDA) 5 MG tablet Take 5mg  daily for one week Take 5mg  in the am and 5mg  in the pm for second week Take 5mg  in the am and 10mg  in the pm for third week  . metoprolol tartrate (LOPRESSOR) 25 MG tablet Take 1 tablet (25 mg total) by mouth 2 (two) times daily.  . multivitamin-iron-minerals-folic acid (CENTRUM) chewable tablet Chew 1 tablet by mouth daily.  [name] OVER THE COUNTER MEDICATION Take 0.5 tablets by mouth daily as needed (for allergy). Patient not sure about the name.  . pantoprazole (PROTONIX) 40 MG tablet Take 1 tablet (40 mg total) by mouth 2 (two) times daily before a meal.   No facility-administered encounter medications on file as of 12/07/2019.    Goals    .  Client will communicate with LCSW in next 30 days to talk about ADLs completion and challenges in doing daily activities of clinet (pt-stated)        Current Barriers:  . Challenges with ADLs completion in client with  Chronic Diagnoses of  HTN< CKD, Hyponatremia, GERD, CHF . Some memory challenges  Clinical Social Work Clinical Goal(s):  [name] LCSW will communicate with client in next 30 days to talk with client about ADLs completion and challenges in doing daily activities for client  Interventions: . Discussed CCM program support . Encouraged client to communicate with RNCM as needed for nursing support . Talked with Crystal Cooper, daughter of client about client's ADLs completion daily . Talked with Crystal Cooper about social support network of client  . Talked with Crystal Cooper about in home  care needs of client . Talked with Crystal Cooper about home health support for client with ADTS and with [place]  . Provided counseling support for Crystal Cooper related to family conflict issues regarding patient care in the home  Patient Self Care Activities:  Attends scheduled medical appointments  . SELF CARE DEFICITS: Challenges in ADLs completion Some mobility challenges   Initial goal documentation    Follow Up Plan:LCSW to call client/daughter, Crystal Cooper, in next 4 weeks to talk with client /daughter about daily ADLs completion of client and about challenges in doing daily activities.  [name].Jaquala Fuller MSW, LCSW Licensed Clinical Social Worker [facility] [phone]

## 2019-12-08 ENCOUNTER — Telehealth: Payer: Self-pay | Admitting: Family Medicine

## 2019-12-08 DIAGNOSIS — R710 Precipitous drop in hematocrit: Secondary | ICD-10-CM

## 2019-12-08 NOTE — Telephone Encounter (Signed)
Quita Skye aware to bring her mother back next Tuesday for a CBC only.  Future order placed.

## 2019-12-08 NOTE — Telephone Encounter (Signed)
Pts daughter calling and states that last week pts hemoglobin was 8.8. She is wanting to see when they will have this rechecked and if their is a plan of action as she is concerned about her hemoglobin being low. Requesting a call from the nurse to discuss.

## 2019-12-08 NOTE — Telephone Encounter (Signed)
Have her come in in 2 weeks. If it drops below 8.0 She will need a transfusion.

## 2019-12-08 NOTE — Telephone Encounter (Signed)
Did you want pt to come back in to check her HGB? I saw the note about her thyroid but didn't see anything on the results addressing the hgb? Please advise.

## 2019-12-08 NOTE — Addendum Note (Signed)
Addended by: Michaela Corner on: 12/08/2019 04:34 PM   Modules accepted: Orders

## 2019-12-08 NOTE — Telephone Encounter (Signed)
Lmtcb.

## 2019-12-09 DIAGNOSIS — H401111 Primary open-angle glaucoma, right eye, mild stage: Secondary | ICD-10-CM | POA: Diagnosis not present

## 2019-12-09 DIAGNOSIS — H40002 Preglaucoma, unspecified, left eye: Secondary | ICD-10-CM | POA: Diagnosis not present

## 2019-12-09 DIAGNOSIS — H353132 Nonexudative age-related macular degeneration, bilateral, intermediate dry stage: Secondary | ICD-10-CM | POA: Diagnosis not present

## 2019-12-13 ENCOUNTER — Emergency Department (HOSPITAL_COMMUNITY): Payer: Medicare Other

## 2019-12-13 ENCOUNTER — Encounter (HOSPITAL_COMMUNITY): Payer: Self-pay

## 2019-12-13 ENCOUNTER — Emergency Department (HOSPITAL_COMMUNITY)
Admission: EM | Admit: 2019-12-13 | Discharge: 2019-12-13 | Disposition: A | Discharge status: Home / Self Care | Payer: Medicare Other | Attending: Emergency Medicine | Admitting: Emergency Medicine

## 2019-12-13 ENCOUNTER — Other Ambulatory Visit: Payer: Self-pay

## 2019-12-13 DIAGNOSIS — R0602 Shortness of breath: Secondary | ICD-10-CM | POA: Diagnosis present

## 2019-12-13 DIAGNOSIS — Z7951 Long term (current) use of inhaled steroids: Secondary | ICD-10-CM | POA: Diagnosis not present

## 2019-12-13 DIAGNOSIS — I130 Hypertensive heart and chronic kidney disease with heart failure and stage 1 through stage 4 chronic kidney disease, or unspecified chronic kidney disease: Secondary | ICD-10-CM | POA: Insufficient documentation

## 2019-12-13 DIAGNOSIS — J45909 Unspecified asthma, uncomplicated: Secondary | ICD-10-CM | POA: Insufficient documentation

## 2019-12-13 DIAGNOSIS — I509 Heart failure, unspecified: Secondary | ICD-10-CM | POA: Diagnosis not present

## 2019-12-13 DIAGNOSIS — N183 Chronic kidney disease, stage 3 unspecified: Secondary | ICD-10-CM | POA: Insufficient documentation

## 2019-12-13 DIAGNOSIS — Z950 Presence of cardiac pacemaker: Secondary | ICD-10-CM | POA: Diagnosis not present

## 2019-12-13 DIAGNOSIS — R2243 Localized swelling, mass and lump, lower limb, bilateral: Secondary | ICD-10-CM | POA: Insufficient documentation

## 2019-12-13 DIAGNOSIS — Z791 Long term (current) use of non-steroidal anti-inflammatories (NSAID): Secondary | ICD-10-CM | POA: Insufficient documentation

## 2019-12-13 DIAGNOSIS — Z20822 Contact with and (suspected) exposure to covid-19: Secondary | ICD-10-CM | POA: Insufficient documentation

## 2019-12-13 LAB — CBC WITH DIFFERENTIAL/PLATELET
Abs Immature Granulocytes: 0.02 10*3/uL (ref 0.00–0.07)
Basophils Absolute: 0.1 10*3/uL (ref 0.0–0.1)
Basophils Relative: 1 %
Eosinophils Absolute: 0.1 10*3/uL (ref 0.0–0.5)
Eosinophils Relative: 3 %
HCT: 28.5 % — ABNORMAL LOW (ref 36.0–46.0)
Hemoglobin: 8.9 g/dL — ABNORMAL LOW (ref 12.0–15.0)
Immature Granulocytes: 0 %
Lymphocytes Relative: 15 %
Lymphs Abs: 0.7 10*3/uL (ref 0.7–4.0)
MCH: 31.8 pg (ref 26.0–34.0)
MCHC: 31.2 g/dL (ref 30.0–36.0)
MCV: 101.8 fL — ABNORMAL HIGH (ref 80.0–100.0)
Monocytes Absolute: 0.5 10*3/uL (ref 0.1–1.0)
Monocytes Relative: 12 %
Neutro Abs: 3.2 10*3/uL (ref 1.7–7.7)
Neutrophils Relative %: 69 %
Platelets: 161 10*3/uL (ref 150–400)
RBC: 2.8 MIL/uL — ABNORMAL LOW (ref 3.87–5.11)
RDW: 15.9 % — ABNORMAL HIGH (ref 11.5–15.5)
WBC: 4.6 10*3/uL (ref 4.0–10.5)
nRBC: 0 % (ref 0.0–0.2)

## 2019-12-13 LAB — BASIC METABOLIC PANEL
Anion gap: 11 (ref 5–15)
BUN: 20 mg/dL (ref 8–23)
CO2: 29 mmol/L (ref 22–32)
Calcium: 8.7 mg/dL — ABNORMAL LOW (ref 8.9–10.3)
Chloride: 94 mmol/L — ABNORMAL LOW (ref 98–111)
Creatinine, Ser: 1.32 mg/dL — ABNORMAL HIGH (ref 0.44–1.00)
GFR calc Af Amer: 41 mL/min — ABNORMAL LOW (ref >60)
GFR calc non Af Amer: 35 mL/min — ABNORMAL LOW (ref >60)
Glucose, Bld: 103 mg/dL — ABNORMAL HIGH (ref 70–99)
Potassium: 3.6 mmol/L (ref 3.5–5.1)
Sodium: 134 mmol/L — ABNORMAL LOW (ref 135–145)

## 2019-12-13 LAB — BRAIN NATRIURETIC PEPTIDE: B Natriuretic Peptide: 468 pg/mL — ABNORMAL HIGH (ref 0.0–100.0)

## 2019-12-13 LAB — TROPONIN I (HIGH SENSITIVITY)
Troponin I (High Sensitivity): 34 ng/L — ABNORMAL HIGH (ref <18)
Troponin I (High Sensitivity): 35 ng/L — ABNORMAL HIGH (ref <18)

## 2019-12-13 LAB — SARS CORONAVIRUS 2 BY RT PCR (HOSPITAL ORDER, PERFORMED IN ~~LOC~~ HOSPITAL LAB): SARS Coronavirus 2: NEGATIVE

## 2019-12-13 MED ORDER — FUROSEMIDE 10 MG/ML IJ SOLN
40.0000 mg | Freq: Once | INTRAMUSCULAR | Status: AC
  Administered 2019-12-13: 40 mg via INTRAVENOUS
  Filled 2019-12-13: qty 4

## 2019-12-13 NOTE — ED Notes (Signed)
Lorriane Shire from [facility] called to confirm Pacemaker in Pt is working appropriately and Pt has long Hx of being in Afib. MD Notified. Lorriane Shire reports she will fax over the Pacemaker report following Interrogation.

## 2019-12-13 NOTE — ED Notes (Signed)
Pt ambulated approximately 7ft with assistance from tech. O2 sat remained 90-94% on room air during ambulation until we began to walk back into patients room - at this time O2 sat dropped to 88% on room air. After assisting pt back to bed O2 came back up to 96% on room air. MD notified

## 2019-12-13 NOTE — Discharge Instructions (Addendum)
Your workup in the ER today was consistent with a heart failure flare up.  We treat this with lasix, to make you urinate (pee) out the extra fluid in your legs and your lungs.  I would recommend increasing your lasix dose to 60 mg once daily for the next 7 days, then go back to your 40 mg daily dose.  Please call your cardiologist's office TOMORROW to discuss your ER visit.   You should ask for an office appointment this week to follow up on your ER visit.  Your heart doctor may wish to make further changes to your medications or order additional testing to look at your heart.  If your breathing becomes more difficult at home, try to first take an EXTRA 40 mg tablet of lasix for one dose.  If this is not helping, or your breathing is worsening, call 911 or return to the ER.

## 2019-12-13 NOTE — ED Triage Notes (Signed)
Pt arrives from home via [facility] c/o SOB X 2 Days. Pts daughter who lives with Pt called [facility] to have Pt brought to ED for evaluation. Pt has no other complaints at this time and is currently on 2 L Nasal Cannula with 100% O2 Sats.

## 2019-12-13 NOTE — ED Notes (Signed)
O2 moved

## 2019-12-13 NOTE — ED Provider Notes (Addendum)
[facility] EMERGENCY DEPARTMENT Provider Note   CSN: [id number] Arrival date & time: 12/13/19  8413     History Chief Complaint  Patient presents with  . Shortness of Breath    Crystal Cooper is a 86 y.o. female.  HPI     This is a 86 year old female with history of atrial fibrillation, heart failure, chronic kidney disease, hypertension who presents with shortness of breath.  Patient reports 2-day history of shortness of breath.  Denies any fevers or cough.  Reports she is short of breath all the time.  Not specifically orthopneic or dyspneic on exertion.  Patient noted to have lower extremity swelling but states that this is unchanged.  Denies any chest pain.  Denies palpitations.  No recent fevers or cough.  Patient is oriented to herself and place but not time.  Past Medical History:  Diagnosis Date  . AKI (acute kidney injury) ([place])   . Anemia    years ago  . Aortic stenosis   . Arthritis   . Asthma   . Atrial fibrillation ([place])   . CHF (congestive heart failure) ([place]) 11/2014  . CKD (chronic kidney disease)   . Family history of adverse reaction to anesthesia    2 daughters would have N/V  . Gastric AVM   . GI bleed   . Glaucoma   . Hearing loss   . Heart murmur   . HTN (hypertension)   . Hypokalemia   . Pneumonia   . Prolapsing mitral leaflet syndrome   . Shortness of breath dyspnea    with exertion  . Stroke ([place])   . SVT (supraventricular tachycardia) (HCC)    S/P ablation of AVNRT in 2003    Patient Active Problem List   Diagnosis Date Noted  . Bilateral impacted cerumen 11/27/2019  . Long term (current) use of anticoagulants   . AKI (acute kidney injury) ([place])   . Wheezing 09/23/2019  . Abnormal breath sounds 09/09/2019  . Heme positive stool   . Acute CVA (cerebrovascular accident) ([place]) 08/27/2019  . PNA (pneumonia) 08/25/2019  . Hypokalemia 08/23/2019  . Angio-edema, initial encounter 08/23/2019  . Reactive airway disease 03/24/2019  .  GERD without esophagitis 03/24/2019  . CKD (chronic kidney disease), stage III 11/12/2018  . GI bleed 05/12/2018  . 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 Diastolic dysfunction 24/40/1027  . Pacemaker 05/12/2018  . Iron deficiency anemia   . Melena   . Duodenal arteriovenous malformation   . Gastritis and gastroduodenitis   . Normocytic anemia 04/06/2018  . Osteoporosis 08/27/2016  . ASCVD (arteriosclerotic cardiovascular disease) 01/11/2016  . Asthma 01/11/2016  . Glaucoma 01/11/2016  . Hearing loss 01/11/2016  . Other nonrheumatic mitral valve disorders 01/11/2016  . Junctional bradycardia   . PAF (paroxysmal atrial fibrillation) ([place]) 09/06/2015  . Hyponatremia 12/17/2014  . Congestive heart failure ([place]) 12/12/2014  . Murmur 10/19/2011  . Chest pain 10/19/2011  . Essential hypertension 10/19/2011  . History of cardiac radiofrequency ablation (RFA) 10/19/2011    Past Surgical History:  Procedure Laterality Date  . APPENDECTOMY    . BIOPSY  04/07/2018   Procedure: BIOPSY;  Surgeon: Jerene Bears, MD;  Location: [facility] ENDOSCOPY;  Service: Gastroenterology;;  . BLADDER SURGERY    . CARDIAC CATHETERIZATION N/A 09/26/2015   Procedure: Right/Left Heart Cath and Coronary Angiography;  Surgeon: Burnell Blanks, MD;  Location: [place] CV LAB;  Service: Cardiovascular;  Laterality: N/A;  . CARDIAC SURGERY    .  CARDIOVERSION N/A 03/02/2016   Procedure: CARDIOVERSION;  Surgeon: Thayer Headings, MD;  Location: [place];  Service: Cardiovascular;  Laterality: N/A;  . EP IMPLANTABLE DEVICE N/A 10/26/2015   Procedure: Pacemaker Implant;  Surgeon: Will Meredith Leeds, MD;  Location: [place] CV LAB;  Service: Cardiovascular;  Laterality: N/A;  . ESOPHAGOGASTRODUODENOSCOPY (EGD) WITH PROPOFOL N/A 04/07/2018   Procedure: ESOPHAGOGASTRODUODENOSCOPY (EGD) WITH PROPOFOL;  Surgeon: Jerene Bears, MD;  Location: [facility] ENDOSCOPY;  Service: Gastroenterology;   Laterality: N/A;  . ESOPHAGOGASTRODUODENOSCOPY (EGD) WITH PROPOFOL N/A 10/12/2019   Procedure: ESOPHAGOGASTRODUODENOSCOPY (EGD) WITH PROPOFOL;  Surgeon: Danie Binder, MD;  [facility] ENDO SU[facility];  Service: Endoscopy;  Laterality: N/A;  . EYE SURGERY Bilateral    cataract surgery  . HOT HEMOSTASIS N/A 04/07/2018   Procedure: HOT HEMOSTASIS (ARGON PLASMA COAGULATION/BICAP);  Surgeon: Jerene Bears, MD;  Location: [facility] ENDOSCOPY;  Service: Gastroenterology;  Laterality: N/A;  . HOT HEMOSTASIS  10/12/2019   Procedure: HOT HEMOSTASIS (ARGON PLASMA COAGULATION/BICAP);  Surgeon: Danie Binder, MD;  Location: [facility] ENDO SUITE;  Service: Endoscopy;;  . NASAL SINUS SURGERY    . PACEMAKER INSERTION    . TEE WITHOUT CARDIOVERSION N/A 10/25/2015   Procedure: TRANSESOPHAGEAL ECHOCARDIOGRAM (TEE);  Surgeon: Burnell Blanks, MD;  Location: [place];  Service: Open Heart Surgery;  Laterality: N/A;  . TRANSCATHETER AORTIC VALVE REPLACEMENT, TRANSFEMORAL Right 10/25/2015   Procedure: TRANSCATHETER AORTIC VALVE REPLACEMENT, TRANSFEMORAL;  Surgeon: Burnell Blanks, MD;  Location: [place];  Service: Open Heart Surgery;  Laterality: Right;     OB History    Gravida  3   Para  3   Term  3   Preterm      AB      Living  2     SAB      TAB      Ectopic      Multiple      Live Births              Family History  Problem Relation Age of Onset  . Hypertension Mother   . Pneumonia Father   . Heart attack Neg Hx   . Colon cancer Neg Hx   . Esophageal cancer Neg Hx     Social History   Tobacco Use  . Smoking status: Never Smoker  . Smokeless tobacco: Never Used  Vaping Use  . Vaping Use: Never used  Substance Use Topics  . Alcohol use: No    Alcohol/week: 0.0 standard drinks  . Drug use: No    Home Medications Prior to Admission medications   Medication Sig Start Date End Date Taking? Authorizing Provider  acetaminophen (TYLENOL) 500 MG tablet Take 500 mg by mouth every 6  (six) hours as needed for mild pain or headache.     [provider]  albuterol (PROAIR HFA) 108 (90 Base) MCG/ACT inhaler Inhale 2 puffs into the lungs every 4 (four) hours as needed for wheezing or shortness of breath. 08/05/19   Baruch Gouty, FNP  Biotin 10 MG CAPS Take 10 mg by mouth daily.     [provider]  Budeson-Glycopyrrol-Formoterol (BREZTRI AEROSPHERE) 160-9-4.8 MCG/ACT AERO Inhale 2 Inhalers into the lungs in the morning and at bedtime. 11/27/19   Ivy Lynn, NP  Calcium Carb-Cholecalciferol (CALTRATE 600+D3) 600-800 MG-UNIT TABS Take 1 tablet by mouth daily.    [provider]  docusate sodium (COLACE) 100 MG capsule Take 100 mg by mouth daily as needed for  mild constipation.    [provider]  famotidine (PEPCID) 20 MG tablet Take 1 tablet (20 mg total) by mouth at bedtime. 10/13/19 10/12/20  Barton Dubois, MD  ferrous sulfate 325 (65 FE) MG tablet Take 1 tablet (325 mg total) by mouth 2 (two) times daily with a meal. 04/08/18   Eulogio Bear U, DO  furosemide (LASIX) 40 MG tablet Take 1 tablet (40 mg total) by mouth daily. 10/27/19   Ivy Lynn, NP  levothyroxine (SYNTHROID) 25 MCG tablet Take 1 tablet (25 mcg total) by mouth daily. 12/02/19   [name], MD  memantine (NAMENDA) 10 MG tablet Take 1 tablet (10 mg total) by mouth 2 (two) times daily. Patient not taking: Reported on 12/01/2019 11/12/19   Garvin Fila, MD  memantine [facility]) 5 MG tablet Take 5mg  daily for one week Take 5mg  in the am and 5mg  in the pm for second week Take 5mg  in the am and 10mg  in the pm for third week 11/12/19   Garvin Fila, MD  metoprolol tartrate (LOPRESSOR) 25 MG tablet Take 1 tablet (25 mg total) by mouth 2 (two) times daily. 05/20/19   Camnitz, Ocie Doyne, MD  multivitamin-iron-minerals-folic acid (CENTRUM) chewable tablet Chew 1 tablet by mouth daily.    [provider]  OVER THE COUNTER MEDICATION Take 0.5 tablets by mouth daily as  needed (for allergy). Patient not sure about the name.    [provider]  pantoprazole (PROTONIX) 40 MG tablet Take 1 tablet (40 mg total) by mouth 2 (two) times daily before a meal. 11/24/19 01/23/20  [name], MD    Allergies    Hctz [hydrochlorothiazide], Aspirin, and Codeine  Review of Systems   Review of Systems  Constitutional: Negative for fever.  Respiratory: Positive for shortness of breath. Negative for cough.   Cardiovascular: Positive for leg swelling. Negative for chest pain.  Gastrointestinal: Negative for abdominal pain, nausea and vomiting.  Genitourinary: Negative for dysuria.  All other systems reviewed and are negative.   Physical Exam Updated Vital Signs BP (!) 163/76   Pulse 60   Temp 97.8 F (36.6 C) (Oral)   Resp (!) 25   Ht 1.575 m (5\' 2" )   Wt 50.8 kg   SpO2 100%   BMI 20.49 kg/m   Physical Exam Vitals and nursing note reviewed.  Constitutional:      Appearance: She is well-developed.     Comments: Chronically ill-appearing but nontoxic, elderly  HENT:     Head: Normocephalic and atraumatic.  Eyes:     Pupils: Pupils are equal, round, and reactive to light.  Cardiovascular:     Rate and Rhythm: Normal rate. Rhythm irregular.     Heart sounds: Normal heart sounds.  Pulmonary:     Effort: Pulmonary effort is normal. Tachypnea present. No accessory muscle usage or respiratory distress.     Breath sounds: Wheezing present.  Abdominal:     General: Bowel sounds are normal.     Palpations: Abdomen is soft.  Musculoskeletal:     Cervical back: Neck supple.     Right lower leg: No tenderness. Edema present.     Left lower leg: No tenderness. Edema present.  Skin:    General: Skin is warm and dry.  Neurological:     Mental Status: She is alert.     Comments: Oriented to place and self  Psychiatric:        Mood and Affect: Mood normal.  ED Results / Procedures / Treatments   Labs (all labs ordered are listed, but only  abnormal results are displayed) Labs Reviewed  CBC WITH DIFFERENTIAL/PLATELET - Abnormal; Notable for the following components:      Result Value   RBC 2.80 (*)    Hemoglobin 8.9 (*)    HCT 28.5 (*)    MCV 101.8 (*)    RDW 15.9 (*)    All other components within normal limits  BASIC METABOLIC PANEL - Abnormal; Notable for the following components:   Sodium 134 (*)    Chloride 94 (*)    Glucose, Bld 103 (*)    Creatinine, Ser 1.32 (*)    Calcium 8.7 (*)    GFR calc non Af Amer 35 (*)    GFR calc Af Amer 41 (*)    All other components within normal limits  BRAIN NATRIURETIC PEPTIDE - Abnormal; Notable for the following components:   B Natriuretic Peptide 468.0 (*)    All other components within normal limits  TROPONIN I (HIGH SENSITIVITY) - Abnormal; Notable for the following components:   Troponin I (High Sensitivity) 34 (*)    All other components within normal limits  SARS CORONAVIRUS 2 BY RT PCR [facility] ORDER, [place] [facility])    EKG EKG Interpretation  Date/Time:  Sunday December 13 2019 06:14:51 EDT Ventricular Rate:  71 PR Interval:    QRS Duration: 149 QT Interval:  439 QTC Calculation: 478 R Axis:   -81 Text Interpretation: Atrial fibrillation Nonspecific IVCD with LAD No longer paced Confirmed by Thayer Jew [phone]) on 12/13/2019 6:18:34 AM   Radiology No results found.  Procedures Procedures (including critical care time)  CRITICAL CARE Performed by: Merryl Hacker   Total critical care time: 31 minutes  Critical care time was exclusive of separately billable procedures and treating other patients.  Critical care was necessary to treat or prevent imminent or life-threatening deterioration.  Critical care was time spent personally by me on the following activities: development of treatment plan with patient and/or surrogate as well as nursing, discussions with consultants, evaluation of patient's response to treatment,  examination of patient, obtaining history from patient or surrogate, ordering and performing treatments and interventions, ordering and review of laboratory studies, ordering and review of radiographic studies, pulse oximetry and re-evaluation of patient's condition.   Medications Ordered in ED Medications - No data to display  ED Course  I have reviewed the triage vital signs and the nursing notes.  Pertinent labs & imaging results that were available during my care of the patient were reviewed by me and considered in my medical decision making (see chart for details).  Clinical Course as of Dec 12 712  Sun Dec 13, 2019  9357 Pacemaker interrogation with no acute events.  Patient is in atrial fibrillation 100% of the time.   [CH]  0701 Pt signed out to me by Dr Dina Rich.  Briefly 86 yo female w/ persistent a fib presenting with 3 days of SOB.  Suspected mild volume overload, possible CHF, pending BNP and trop.   [MT]    Clinical Course User Index [CH] Kanye Depree, Barbette Hair, MD [MT] Langston Masker Carola Rhine, MD   MDM Rules/Calculators/A&P                           Patient presents with reported shortness of breath.  She is overall nontoxic and vital signs notable for blood pressure  of 169/76.  She is mildly tachypneic with a respiratory rate of 24.  She has been wheezing on exam and some lower extremity edema.  Considerations include but not limited to pneumonia, COVID-19, CHF.  No known history of COPD or asthma.  Will obtain chest x-ray and lab work.  Will interrogate pacemaker.  EKG shows atrial fibrillation with intraventricular conduction delay.  No acute ischemic changes.  Doubt ACS.  Chest x-ray independently reviewed by myself and shows some mild pulmonary edema.  Elevated BNP.  Troponin 34.  Last troponin XX 9.  Arguably stable.  Presentation favor CHF.  Patient will be given a dose of Lasix.  Patient signed out to Dr. Langston Masker.    Final Clinical Impression(s) / ED Diagnoses Final  diagnoses:  SOB (shortness of breath)    Rx / DC Orders ED Discharge Orders    None       Arieonna Medine, Barbette Hair, MD 12/13/19 0715    Merryl Hacker, MD 12/13/19 [id number]

## 2019-12-13 NOTE — ED Provider Notes (Signed)
Clinical Course as of Dec 12 1817  Sun Dec 13, 2019  5170 Pacemaker interrogation with no acute events.  Patient is in atrial fibrillation 100% of the time.   [CH]  0701 Pt signed out to me by Dr Dina Rich.  Briefly 86 yo female w/ persistent a fib presenting with 3 days of SOB.  Suspected mild volume overload, possible CHF, pending BNP and trop.   [MT]  [id number] IV lasix ordered by Dr Dina Rich.  BNP elevated, trop near its baseline, will need repeat trop and I will reassess after IV diuresis   [MT]  0824  IMPRESSION: Mild stable cardiomegaly with suggestion of mild vascular congestion.   [MT]  0851 Repeat troponin flat at 35   [MT]  1028 Ambulated with lowest desaturation 88% but back to 96% at rest on room air, I think this is reasonable for discharge home given her limited mobility at baseline, will increase lasix to 60 mg daily for 1 week and have her f/u with cardiology   [MT]    Clinical Course User Index [CH] Horton, Barbette Hair, MD [MT] Wyvonnia Dusky, MD      Wyvonnia Dusky, MD 12/13/19 [phone]

## 2019-12-14 ENCOUNTER — Telehealth: Payer: Self-pay | Admitting: Family Medicine

## 2019-12-14 NOTE — Telephone Encounter (Signed)
Family requesting provider review last hospital notes for update on patient.

## 2019-12-14 NOTE — Telephone Encounter (Signed)
Tell the family I said thank you for letting me know.  I reviewed the notes from both Dr. Dina Rich and Dr. Langston Masker to get a better idea of how she was doing.  Ask them to let me know if there is anything she needs.

## 2019-12-15 ENCOUNTER — Encounter: Payer: Self-pay | Admitting: Adult Health

## 2019-12-15 ENCOUNTER — Other Ambulatory Visit: Payer: Medicare Other

## 2019-12-15 ENCOUNTER — Ambulatory Visit: Payer: Medicare Other | Admitting: Licensed Clinical Social Worker

## 2019-12-15 ENCOUNTER — Ambulatory Visit (INDEPENDENT_AMBULATORY_CARE_PROVIDER_SITE_OTHER): Payer: Medicare Other | Admitting: Adult Health

## 2019-12-15 ENCOUNTER — Other Ambulatory Visit: Payer: Self-pay

## 2019-12-15 VITALS — BP 138/68 | HR 61 | Ht 61.0 in | Wt 115.0 lb

## 2019-12-15 DIAGNOSIS — N1832 Chronic kidney disease, stage 3b: Secondary | ICD-10-CM

## 2019-12-15 DIAGNOSIS — R600 Localized edema: Secondary | ICD-10-CM

## 2019-12-15 DIAGNOSIS — Z79899 Other long term (current) drug therapy: Secondary | ICD-10-CM

## 2019-12-15 DIAGNOSIS — I63511 Cerebral infarction due to unspecified occlusion or stenosis of right middle cerebral artery: Secondary | ICD-10-CM | POA: Diagnosis not present

## 2019-12-15 DIAGNOSIS — Z952 Presence of prosthetic heart valve: Secondary | ICD-10-CM

## 2019-12-15 DIAGNOSIS — N1831 Chronic kidney disease, stage 3a: Secondary | ICD-10-CM | POA: Diagnosis not present

## 2019-12-15 DIAGNOSIS — I10 Essential (primary) hypertension: Secondary | ICD-10-CM

## 2019-12-15 DIAGNOSIS — I639 Cerebral infarction, unspecified: Secondary | ICD-10-CM

## 2019-12-15 DIAGNOSIS — I5032 Chronic diastolic (congestive) heart failure: Secondary | ICD-10-CM

## 2019-12-15 DIAGNOSIS — Z950 Presence of cardiac pacemaker: Secondary | ICD-10-CM

## 2019-12-15 DIAGNOSIS — I4811 Longstanding persistent atrial fibrillation: Secondary | ICD-10-CM

## 2019-12-15 DIAGNOSIS — K219 Gastro-esophageal reflux disease without esophagitis: Secondary | ICD-10-CM

## 2019-12-15 DIAGNOSIS — D509 Iron deficiency anemia, unspecified: Secondary | ICD-10-CM | POA: Diagnosis not present

## 2019-12-15 DIAGNOSIS — J45909 Unspecified asthma, uncomplicated: Secondary | ICD-10-CM

## 2019-12-15 DIAGNOSIS — R710 Precipitous drop in hematocrit: Secondary | ICD-10-CM

## 2019-12-15 DIAGNOSIS — M810 Age-related osteoporosis without current pathological fracture: Secondary | ICD-10-CM

## 2019-12-15 LAB — CBC WITH DIFFERENTIAL/PLATELET
Basophils Absolute: 0.1 10*3/uL (ref 0.0–0.2)
Basos: 1 %
EOS (ABSOLUTE): 0.1 10*3/uL (ref 0.0–0.4)
Eos: 3 %
Hematocrit: 28.9 % — ABNORMAL LOW (ref 34.0–46.6)
Hemoglobin: 9.4 g/dL — ABNORMAL LOW (ref 11.1–15.9)
Immature Grans (Abs): 0 10*3/uL (ref 0.0–0.1)
Immature Granulocytes: 0 %
Lymphocytes Absolute: 0.9 10*3/uL (ref 0.7–3.1)
Lymphs: 20 %
MCH: 32 pg (ref 26.6–33.0)
MCHC: 32.5 g/dL (ref 31.5–35.7)
MCV: 98 fL — ABNORMAL HIGH (ref 79–97)
Monocytes Absolute: 0.6 10*3/uL (ref 0.1–0.9)
Monocytes: 13 %
Neutrophils Absolute: 2.8 10*3/uL (ref 1.4–7.0)
Neutrophils: 63 %
Platelets: 201 10*3/uL (ref 150–450)
RBC: 2.94 x10E6/uL — ABNORMAL LOW (ref 3.77–5.28)
RDW: 13.5 % (ref 11.7–15.4)
WBC: 4.5 10*3/uL (ref 3.4–10.8)

## 2019-12-15 MED ORDER — POTASSIUM CHLORIDE CRYS ER 20 MEQ PO TBCR
20.0000 meq | EXTENDED_RELEASE_TABLET | Freq: Every day | ORAL | 3 refills | Status: DC
Start: 2019-12-15 — End: 2020-02-09

## 2019-12-15 MED ORDER — FUROSEMIDE 20 MG PO TABS
60.0000 mg | ORAL_TABLET | Freq: Every day | ORAL | 3 refills | Status: DC
Start: 2019-12-15 — End: 2020-02-22

## 2019-12-15 NOTE — Patient Instructions (Signed)
Medication Instructions:  INCREASE- Furosemide(Lasix) 60 mg by mouth daily START- Potassium 20 meq by mouth daily  *If you need a refill on your cardiac medications before your next appointment, please call your pharmacy*   Lab Work: BMP in 2 weeks  If you have labs (blood work) drawn today and your tests are completely normal, you will receive your results only by: [name] MyChart Message (if you have MyChart) OR . A paper copy in the mail If you have any lab test that is abnormal or we need to change your treatment, we will call you to review the results.   Testing/Procedures: None Ordered   Follow-Up: At [facility], you and your health needs are our priority.  As part of our continuing mission to provide you with exceptional heart care, we have created designated Provider Care Teams.  These Care Teams include your primary Cardiologist (physician) and Advanced Practice Providers (APPs -  Physician Assistants and Nurse Practitioners) who all work together to provide you with the care you need, when you need it.  We recommend signing up for the patient portal called "MyChart".  Sign up information is provided on this After Visit Summary.  MyChart is used to connect with patients for Virtual Visits (Telemedicine).  Patients are able to view lab/test results, encounter notes, upcoming appointments, etc.  Non-urgent messages can be sent to your provider as well.   To learn more about what you can do with MyChart, go to [email or web address].    Your next appointment:   1 month(s)  The format for your next appointment:   In Person  Provider:   [name], MD or Jory Sims, DNP

## 2019-12-15 NOTE — Progress Notes (Signed)
Cardiology Office Note   Date:  12/15/2019   ID:  Crystal, Cooper 09/20/1926, MRN [id number]  PCP:  [name], MD  Cardiologist:  Leighton Roach chief complaint on file.    History of Present Illness: Crystal Cooper is a 86 y.o. female who presents for post ED visit after being seen at [facility] for decompensated CHF and dyspnea on 12/13/2019.  She was given IV Lasix 40 mg x 1 and diuresed well and started on 60 mg daily increased from 40 mg daily at home.  Other history includes paroxysmal atrial fibrillation, status post AVNRT in 2003, aortic valve stenosis, CAD.  She is status post TAVR on 10/2015.  She had subsequent atrial flutter with a cardioversion on 03/02/2016.  Prior history of GI bleed in October 2019 with a hemoglobin dropping to 5.4.  She was treated for duodenitis and duodenal AVM.  Eliquis was reinstated.  It was noted that the patient's hemoglobin was 8.9 with hematocrit of 28.5 while being evaluated in ED on 12/13/2019.  This is prior to diuresis.  She also has pacemaker, most recently checked remotely on 11/11/2019.  This revealed that she was in A. fib battery and lead parameters were stable.  After diuresis during ED visit, the patient has felt better.  Lower extremity edema significantly improved and breathing status has normalized.  The patient is not very verbal and does have significant hard of hearing deficiency.  Her daughter who accompanies her is very attentive.  She states that the patient does not sleep in a bed but sleeps in a lift chair with the head mildly elevated.  The patient denies any PND, orthopnea, or chest pressure at this time.  She is currently in a wheelchair.  Her daughter does have help at home to assist her in the care this patient.   Past Medical History:  Diagnosis Date  . AKI (acute kidney injury) ([place])   . Anemia    years ago  . Aortic stenosis   . Arthritis   . Asthma   . Atrial fibrillation ([place])   . CHF (congestive heart  failure) ([place]) 11/2014  . CKD (chronic kidney disease)   . Family history of adverse reaction to anesthesia    2 daughters would have N/V  . Gastric AVM   . GI bleed   . Glaucoma   . Hearing loss   . Heart murmur   . HTN (hypertension)   . Hypokalemia   . Pneumonia   . Prolapsing mitral leaflet syndrome   . Shortness of breath dyspnea    with exertion  . Stroke ([place])   . SVT (supraventricular tachycardia) (H[facility])    S/P ablation of AVNRT in 2003    Past Surgical History:  Procedure Laterality Date  . APPENDECTOMY    . BIOPSY  04/07/2018   Procedure: BIOPSY;  Surgeon: Jerene Bears, MD;  Location: [facility] ENDOSCOPY;  Service: Gastroenterology;;  . BLADDER SURGERY    . CARDIAC CATHETERIZATION N/A 09/26/2015   Procedure: Right/Left Heart Cath and Coronary Angiography;  Surgeon: Burnell Blanks, MD;  Location: [place] CV LAB;  Service: Cardiovascular;  Laterality: N/A;  . CARDIAC SURGERY    . CARDIOVERSION N/A 03/02/2016   Procedure: CARDIOVERSION;  Surgeon: Thayer Headings, MD;  Location: [place];  Service: Cardiovascular;  Laterality: N/A;  . EP IMPLANTABLE DEVICE N/A 10/26/2015   Procedure: Pacemaker Implant;  Surgeon: Will Meredith Leeds, MD;  Location: [place] CV LAB;  Service: Cardiovascular;  Laterality: N/A;  . ESOPHAGOGASTRODUODENOSCOPY (EGD) WITH PROPOFOL N/A 04/07/2018   Procedure: ESOPHAGOGASTRODUODENOSCOPY (EGD) WITH PROPOFOL;  Surgeon: Jerene Bears, MD;  Location: [facility] ENDOSCOPY;  Service: Gastroenterology;  Laterality: N/A;  . ESOPHAGOGASTRODUODENOSCOPY (EGD) WITH PROPOFOL N/A 10/12/2019   Procedure: ESOPHAGOGASTRODUODENOSCOPY (EGD) WITH PROPOFOL;  Surgeon: Danie Binder, MD;  [facility] ENDO SUITE;  Service: Endoscopy;  Laterality: N/A;  . EYE SURGERY Bilateral    cataract surgery  . HOT HEMOSTASIS N/A 04/07/2018   Procedure: HOT HEMOSTASIS (ARGON PLASMA COAGULATION/BICAP);  Surgeon: Jerene Bears, MD;  Location: [facility] ENDOSCOPY;  Service: Gastroenterology;   Laterality: N/A;  . HOT HEMOSTASIS  10/12/2019   Procedure: HOT HEMOSTASIS (ARGON PLASMA COAGULATION/BICAP);  Surgeon: Danie Binder, MD;  Location: [facility] ENDO SUITE;  Service: Endoscopy;;  . NASAL SINUS SURGERY    . PACEMAKER INSERTION    . TEE WITHOUT CARDIOVERSION N/A 10/25/2015   Procedure: TRANSESOPHAGEAL ECHOCARDIOGRAM (TEE);  Surgeon: Burnell Blanks, MD;  Location: [place];  Service: Open Heart Surgery;  Laterality: N/A;  . TRANSCATHETER AORTIC VALVE REPLACEMENT, TRANSFEMORAL Right 10/25/2015   Procedure: TRANSCATHETER AORTIC VALVE REPLACEMENT, TRANSFEMORAL;  Surgeon: Burnell Blanks, MD;  Location: [place];  Service: Open Heart Surgery;  Laterality: Right;     Current Outpatient Medications  Medication Sig Dispense Refill  . acetaminophen (TYLENOL) 500 MG tablet Take 500 mg by mouth every 6 (six) hours as needed for mild pain or headache.     . albuterol (PROAIR HFA) 108 (90 Base) MCG/ACT inhaler Inhale 2 puffs into the lungs every 4 (four) hours as needed for wheezing or shortness of breath. 18 g 11  . apixaban (ELIQUIS) 5 MG TABS tablet Take 2.5 mg by mouth 2 (two) times daily. 1/2 tab in the morning and 1/2 tab in the evening    . Biotin 10 MG CAPS Take 10 mg by mouth daily.     . Budeson-Glycopyrrol-Formoterol (BREZTRI AEROSPHERE) 160-9-4.8 MCG/ACT AERO Inhale 2 Inhalers into the lungs in the morning and at bedtime. 5.9 g 1  . Calcium Carb-Cholecalciferol (CALTRATE 600+D3) 600-800 MG-UNIT TABS Take 1 tablet by mouth daily.    . famotidine (PEPCID) 20 MG tablet Take 1 tablet (20 mg total) by mouth at bedtime. 30 tablet 1  . ferrous sulfate 325 (65 FE) MG tablet Take 1 tablet (325 mg total) by mouth 2 (two) times daily with a meal.  3  . furosemide (LASIX) 40 MG tablet Take 1 tablet (40 mg total) by mouth daily. 30 tablet 2  . levothyroxine (SYNTHROID) 25 MCG tablet Take 1 tablet (25 mcg total) by mouth daily. 30 tablet 2  . memantine (NAMENDA) 10 MG tablet Take 1 tablet (10 mg  total) by mouth 2 (two) times daily. 60 tablet 0  . metoprolol tartrate (LOPRESSOR) 25 MG tablet Take 1 tablet (25 mg total) by mouth 2 (two) times daily. 180 tablet 3  . multivitamin-iron-minerals-folic acid (CENTRUM) chewable tablet Chew 1 tablet by mouth daily.    [name] OVER THE COUNTER MEDICATION Take 0.5 tablets by mouth daily as needed (for allergy). Patient not sure about the name.    . pantoprazole (PROTONIX) 40 MG tablet Take 1 tablet (40 mg total) by mouth 2 (two) times daily before a meal. 60 tablet 0   No current facility-administered medications for this visit.    Allergies:   Hctz [hydrochlorothiazide], Aspirin, and Codeine    Social History:  The patient  reports that she has never smoked. She  has never used smokeless tobacco. She reports that she does not drink alcohol and does not use drugs.   Family History:  The patient's family history includes Hypertension in her mother; Pneumonia in her father.    ROS: All other systems are reviewed and negative. Unless otherwise mentioned in H&P    PHYSICAL EXAM: VS:  BP 138/68   Pulse 61   Ht 5\' 1"  (1.549 m)   Wt 115 lb (52.2 kg)   SpO2 97%   BMI 21.73 kg/m  , BMI Body mass index is 21.73 kg/m. GEN: Well nourished, well developed, in no acute distress.  Frail sitting in a wheelchair HEENT: normal Neck: no JVD, carotid bruits, or masses Cardiac: RRR; 1/6 systolic murmur murmurs, rubs, or gallops,no edema.  Compression hose in place Respiratory:  Clear to auscultation bilaterally, normal work of breathing GI: soft, nontender, nondistended, + BS MS: no deformity or atrophy Skin: warm and dry, no rash Neuro:  Strength and sensation are intact.  Very hard of hearing Psych: euthymic mood, flat l affect   EKG: Not completed this office visit  Recent Labs: 10/12/2019: Magnesium 2.0 12/01/2019: ALT 16; TSH 6.540 12/13/2019: B Natriuretic Peptide 468.0; BUN 20; Creatinine, Ser 1.32; Hemoglobin 8.9; Platelets 161; Potassium 3.6;  Sodium 134    Lipid Panel    Component Value Date/Time   CHOL 129 09/29/2019 1021   TRIG 101 09/29/2019 1021   HDL 59 09/29/2019 1021   CHOLHDL 2.2 09/29/2019 1021   CHOLHDL 2.5 08/27/2019 0117   VLDL 12 08/27/2019 0117   LDLCALC 51 09/29/2019 1021      Wt Readings from Last 3 Encounters:  12/15/19 115 lb (52.2 kg)  12/13/19 112 lb (50.8 kg)  12/01/19 114 lb (51.7 kg)      Other studies Reviewed: Echocardiogram 07-24-19 1. Left ventricular ejection fraction, by visual estimation, is 60 to  65%. The left ventricle has normal function. There is mildly increased  left ventricular hypertrophy.  2. Abnormal septal motion consistent with RV pacemaker.  3. Elevated left atrial pressure.  4. Left ventricular diastolic parameters are consistent with Grade II  diastolic dysfunction (pseudonormalization).  5. The left ventricle has no regional wall motion abnormalities.  6. Global right ventricle has normal systolic function.The right  ventricular size is normal. No increase in right ventricular wall  thickness.  7. Left atrial size was mild-moderately dilated.  8. The mitral valve is degenerative. Mild mitral valve regurgitation.  9. The tricuspid valve is grossly normal.  10. 29 mm Edwards S3. Vmax 1.5 m/s, mean gradient 5.3 mmHG, EOA 1.63 cm2,  DI 0.52. No regurgitation or paravalvular leak.  11. Mildly elevated pulmonary artery systolic pressure.  12. The tricuspid regurgitant velocity is 3.01 m/s, and with an assumed  right atrial pressure of 3 mmHg, the estimated right ventricular systolic  pressure is mildly elevated at 39.3 mmHg.  13. The inferior vena cava is normal in size with greater than 50%  respiratory variability, suggesting right atrial pressure of 3 mmHg.    ASSESSMENT AND PLAN:  1.  Atrial fibrillation: She remains on low-dose of apixaban 2.5 mg twice daily.  Most recent hemoglobin 8.9.  This will need to be followed closely.  If hemoglobin  continues to drop it is been advised that she discontinue Eliquis altogether.  Heart rate is currently well controlled rate controlled on metoprolol 25 mg twice daily  2.  Chronic diastolic heart failure: Recently seen in any pain ED given IV diuresis and  increased dose of Lasix to 60 mg daily.  Repeat BMET.  Continue daily weights and low-sodium diet.  Her daughter is to call us if she gains weight despite higher dose of Lasix.  She does not have significant volume overload on exam today.  3.  Asthma: Remains on inhalers followed by PCP  4.  Anemia: On iron replacement therapy.  Will need to follow hemoglobin and hematocrit and possibly stop Eliquis should her hemoglobin decrease.  Most recent in ED was 8.9 prior to diuresis.  Current medicines are reviewed at length with the patient today.  I have spent 25 minutes dedicated to the care of this patient on the date of this encounter to include pre-visit review of records, assessment, management and diagnostic testing,with shared decision making.  Labs/ tests ordered today include: BMET  Phill Myron. West Pugh, ANP, [facility]   12/15/2019 10:21 AM    [facility] [place] [address] Office [phone] Fax [phone]  Notice: This dictation was prepared with Dragon dictation along with smaller phrase technology. Any transcriptional errors that result from this process are unintentional and may not be corrected upon review.

## 2019-12-15 NOTE — Patient Instructions (Addendum)
Licensed Clinical Social Worker Visit Information  Goals we discussed today:    Client will communicate with LCSW in next 30 days to talk about ADLs completion and challenges in doing daily activities of clinet (pt-stated)            Current Barriers:   Challenges with ADLs completion in client with Chronic Diagnoses of  HTN< CKD, Hyponatremia, GERD, CHF  Some memory challenges  Clinical Social Work Clinical Goal(s):   LCSW will communicate with client in next 30 days to talk with client about ADLs completion and challenges in doing daily activities for client  Interventions:  Talked with Synetta Fail ,daughter,about current needs of client  Talked with Quita Skye about CCM program support  Talked with Quita Skye about ADLs completion daily for client  Talked with Quita Skye about social support network for client  (client has support from her daughters)   Talked with Quita Skye about ambulation challenges of client (uses a cane to walk)   Talked with Quita Skye about pain issues of client   Talked with Quita Skye about upcoming client appointments  Encouraged Quita Skye or client to talk with [facility] as needed to discuss nursing needs of client  Talked with Quita Skye about client occasional shortness of breath  Talked with Quita Skye about client's decreased energy  Patient Self Care Activities:  Attends scheduled medical appointments   SELF CARE DEFICITS: Challenges in ADLs completion Some mobility challenges    Initial goal documentation     Follow Up Plan: LCSW to call client/daughter, [name],in next 4 weeks to talk with client /daughterabout daily ADLs completion of client and about challenges in doing daily activities.  Materials Provided: No  The patient Synetta Fail, daughter, verbalized understanding of instructions provided today and declined a print copy of patient instruction materials.   [name].Kristianne Albin MSW, LCSW Licensed Clinical Social Worker [facility] Care Management [phone]

## 2019-12-15 NOTE — Chronic Care Management (AMB) (Signed)
Chronic Care Management    Clinical Social Work Follow Up Note  12/15/2019 Name: Crystal Cooper MRN: [id number] DOB: 04-30-1927  Crystal Cooper is a 86 y.o. year old female who is a primary care patient of Stacks, [name], MD. The CCM team was consulted for assistance with [employer] .   Review of patient status, including review of consultants reports, other relevant assessments, and collaboration with appropriate care team members and the patient's provider was performed as part of comprehensive patient evaluation and provision of chronic care management services.    SDOH (Social Determinants of Health) assessments performed: No;risk for physical inactivity; risk for depression; risk for social isolation; risk for stress    Chronic Care Management from 09/11/2019 in [place]  PHQ-9 Total Score 5     GAD 7 : Generalized Anxiety Score 09/11/2019  Nervous, Anxious, on Edge 1  Control/stop worrying 1  Worry too much - different things 1  Trouble relaxing 0  Restless 0  Easily annoyed or irritable 0  Afraid - awful might happen 0  Total GAD 7 Score 3  Anxiety Difficulty Somewhat difficult    Outpatient Encounter Medications as of 12/15/2019  Medication Sig  . acetaminophen (TYLENOL) 500 MG tablet Take 500 mg by mouth every 6 (six) hours as needed for mild pain or headache.   . albuterol (PROAIR HFA) 108 (90 Base) MCG/ACT inhaler Inhale 2 puffs into the lungs every 4 (four) hours as needed for wheezing or shortness of breath.  [name] apixaban (ELIQUIS) 5 MG TABS tablet Take 2.5 mg by mouth 2 (two) times daily. 1/2 tab in the morning and 1/2 tab in the evening  . Biotin 10 MG CAPS Take 10 mg by mouth daily.   . Budeson-Glycopyrrol-Formoterol (BREZTRI AEROSPHERE) 160-9-4.8 MCG/ACT AERO Inhale 2 Inhalers into the lungs in the morning and at bedtime.  . Calcium Carb-Cholecalciferol (CALTRATE 600+D3) 600-800 MG-UNIT TABS Take 1 tablet by mouth daily.  .  famotidine (PEPCID) 20 MG tablet Take 1 tablet (20 mg total) by mouth at bedtime.  . ferrous sulfate 325 (65 FE) MG tablet Take 1 tablet (325 mg total) by mouth 2 (two) times daily with a meal.  . furosemide (LASIX) 20 MG tablet Take 3 tablets (60 mg total) by mouth daily.  [name] levothyroxine (SYNTHROID) 25 MCG tablet Take 1 tablet (25 mcg total) by mouth daily.  . memantine (NAMENDA) 10 MG tablet Take 1 tablet (10 mg total) by mouth 2 (two) times daily.  . metoprolol tartrate (LOPRESSOR) 25 MG tablet Take 1 tablet (25 mg total) by mouth 2 (two) times daily.  . multivitamin-iron-minerals-folic acid (CENTRUM) chewable tablet Chew 1 tablet by mouth daily.  [name] OVER THE COUNTER MEDICATION Take 0.5 tablets by mouth daily as needed (for allergy). Patient not sure about the name.  . pantoprazole (PROTONIX) 40 MG tablet Take 1 tablet (40 mg total) by mouth 2 (two) times daily before a meal.  . potassium chloride SA (KLOR-CON) 20 MEQ tablet Take 1 tablet (20 mEq total) by mouth daily.  . [DISCONTINUED] furosemide (LASIX) 40 MG tablet Take 1 tablet (40 mg total) by mouth daily.   No facility-administered encounter medications on file as of 12/15/2019.    GOAL:   .  Client will communicate with LCSW in next 30 days to talk about ADLs completion and challenges in doing daily activities of clinet (pt-stated)        Current Barriers:  . Challenges with ADLs completion in client  with Chronic Diagnoses of  HTN< CKD, Hyponatremia, GERD, CHF . Some memory challenges  Clinical Social Work Clinical Goal(s):  [name] LCSW will communicate with client in next 30 days to talk with client about ADLs completion and challenges in doing daily activities for client  Interventions: . Talked with Synetta Fail ,daughter,about current needs of client . Talked with Quita Skye about CCM program support . Talked with Quita Skye about ADLs completion daily for client . Talked with Quita Skye about social support network for client  (client has  support from her daughters) Talked with Quita Skye about ambulation challenges of client (uses a cane to walk) .  Talked with Quita Skye about pain issues of client  . Talked with Quita Skye about upcoming client appointments . Encouraged Quita Skye or client to talk with [facility] as needed to discuss nursing needs of client . Talked with Quita Skye about client occasional shortness of breath . Talked with Quita Skye about client's decreased energy  Patient Self Care Activities:  Attends scheduled medical appointments  . SELF CARE DEFICITS: Challenges in ADLs completion Some mobility challenges    Initial goal documentation     Follow Up Plan: LCSW to call client/daughter, [name],in next 4 weeks to talk with client /daughterabout daily ADLs completion of client and about challenges in doing daily activities.  [name].Desma Wilkowski MSW, LCSW Licensed Clinical Social Worker [facility] Care Management [phone]

## 2019-12-16 ENCOUNTER — Ambulatory Visit (INDEPENDENT_AMBULATORY_CARE_PROVIDER_SITE_OTHER): Payer: Medicare Other | Admitting: Neurology

## 2019-12-16 ENCOUNTER — Telehealth: Payer: Self-pay

## 2019-12-16 DIAGNOSIS — F01518 Vascular dementia, unspecified severity, with other behavioral disturbance: Secondary | ICD-10-CM

## 2019-12-16 DIAGNOSIS — R410 Disorientation, unspecified: Secondary | ICD-10-CM | POA: Diagnosis not present

## 2019-12-16 NOTE — Progress Notes (Signed)
Hello Crystal Cooper,  Your lab result is normal and/or stable.Some minor variations that are not significant are commonly marked abnormal, but do not represent any medical problem for you.  Best regards, Abbigale Mcelhaney, M.D.

## 2019-12-16 NOTE — Patient Instructions (Signed)
Visit Information  Goals Addressed              This Visit's Progress     Patient Stated   .  Daughter, [name]:"I'm concerned about her memory" (pt-stated)        CARE PLAN ENTRY (see longitudinal plan of care for additional care plan information)  Current Barriers:  . Care Coordination needs related to memory loss in a patient with HTN, CHF, CKD, hx of CVA (disease states) . Cognitive Deficits  Nurse Case Manager Clinical Goal(s):  [name] Over the next 45 days, patient will have an initial visit with a neurologist . Over the next 30 days, patient/family will reach out to PCP with any new or worsening symptoms  Interventions:  . Inter-disciplinary care team collaboration (see longitudinal plan of care) . Chart reviewed including recent office notes and lab results . Talked with daughter, [name], by telephone . Discussed recent decline in memory and cognition . Discussed safety concerns . Dicussed recent UTI . Discussed lower extremity edema . Collaborated with PCP clinical staff to add an ammonia level to the labs that home health is drawing . Talked with home health nurse about adding an ammonia level . Reviewed upcoming appointments . Provided with RNCM contact information and encouraged to reach out as needed . Encouraged daughter to reach out to PCP with any new or worsening symptoms  Patient Self Care Activities:  . Unable to perform IADLs independently . Needs assistance with some ADLs  Initial goal documentation        The patient verbalized understanding of instructions provided today and declined a print copy of patient instruction materials.   Follow-up Plan The care management team will reach out to the patient again over the next 30 days.   [name], BSN, RN-BC Embedded Chronic Care Manager [facility] [place] Management Direct Dial: [phone]

## 2019-12-16 NOTE — Chronic Care Management (AMB) (Signed)
  Chronic Care Management   Follow Up Note   11/05/2019 Name: Crystal Cooper MRN: [id number] DOB: Jun 18, 1927  Referred by: [name], MD Reason for referral : Chronic Care Management (RN follow up)   Crystal Cooper is a 86 y.o. year old female who is a primary care patient of Stacks, [name], MD. The CCM team was consulted for assistance with chronic disease management and care coordination needs.    Review of patient status, including review of consultants reports, relevant laboratory and other test results, and collaboration with appropriate care team members and the patient's provider was performed as part of comprehensive patient evaluation and provision of chronic care management services.    SDOH (Social Determinants of Health) assessments performed: No See Care Plan activities for detailed interventions related to [facility])    RN Care Plan   .  Daughter, [name]:"I'm concerned about her memory" (pt-stated)        CARE PLAN ENTRY (see longitudinal plan of care for additional care plan information)  Current Barriers:  . Care Coordination needs related to memory loss in a patient with HTN, CHF, CKD, hx of CVA (disease states) . Cognitive Deficits  Nurse Case Manager Clinical Goal(s):  [name] Over the next 45 days, patient will have an initial visit with a neurologist . Over the next 30 days, patient/family will reach out to PCP with any new or worsening symptoms  Interventions:  . Inter-disciplinary care team collaboration (see longitudinal plan of care) . Chart reviewed including recent office notes and lab results . Talked with daughter, [name], by telephone . Discussed recent decline in memory and cognition . Discussed safety concerns . Dicussed recent UTI . Discussed lower extremity edema . Collaborated with PCP clinical staff to add an ammonia level to the labs that home health is drawing . Talked with home health nurse about adding an ammonia level . Reviewed upcoming  appointments . Provided with RNCM contact information and encouraged to reach out as needed . Encouraged daughter to reach out to PCP with any new or worsening symptoms  Patient Self Care Activities:  . Unable to perform IADLs independently . Needs assistance with some ADLs  Initial goal documentation         Plan:   The care management team will reach out to the patient again over the next 30 days.    [name], BSN, RN-BC Embedded Chronic Care Manager [facility] [place] [facility] Direct Dial: [phone]

## 2019-12-16 NOTE — Telephone Encounter (Signed)
-----   Message from Melvenia Beam, MD sent at 12/03/2019 12:46 PM EDT ----- Patient's MRI showed advanced vascular disease but this is already known and Dr. Leonie Man has discussed vascular causes may be impacting her memory. She has had several new strokes since her last MRI but Dr. Leonie Man restarted her Eliquis at last appointment. She can discuss further at follow up but no other new issues seen.

## 2019-12-16 NOTE — Telephone Encounter (Signed)
I called pts daughter [name] to give MRI results. I stated it showed advanced vascular disease but this is already known and Dr. Leonie Man has discussed vascular causes may be impacting her memory. She has had several new strokes since her last MRI but Dr. Leonie Man restarted her Eliquis at last appt. I stated pt has a EEG today and a follow up with Dr Leonie Man in the office in July 2021.[name] verbalized understanding and pt is still taking her eliquis 2.5 bid.

## 2019-12-18 ENCOUNTER — Ambulatory Visit (INDEPENDENT_AMBULATORY_CARE_PROVIDER_SITE_OTHER): Payer: Medicare Other | Admitting: Internal Medicine

## 2019-12-18 ENCOUNTER — Other Ambulatory Visit (INDEPENDENT_AMBULATORY_CARE_PROVIDER_SITE_OTHER): Payer: Medicare Other

## 2019-12-18 ENCOUNTER — Encounter: Payer: Self-pay | Admitting: Internal Medicine

## 2019-12-18 VITALS — BP 155/68 | HR 72 | Ht 61.0 in | Wt 116.5 lb

## 2019-12-18 DIAGNOSIS — I63511 Cerebral infarction due to unspecified occlusion or stenosis of right middle cerebral artery: Secondary | ICD-10-CM

## 2019-12-18 DIAGNOSIS — K5520 Angiodysplasia of colon without hemorrhage: Secondary | ICD-10-CM

## 2019-12-18 DIAGNOSIS — Z7901 Long term (current) use of anticoagulants: Secondary | ICD-10-CM

## 2019-12-18 DIAGNOSIS — D500 Iron deficiency anemia secondary to blood loss (chronic): Secondary | ICD-10-CM

## 2019-12-18 DIAGNOSIS — D509 Iron deficiency anemia, unspecified: Secondary | ICD-10-CM | POA: Diagnosis not present

## 2019-12-18 LAB — IBC + FERRITIN
Ferritin: 30.1 ng/mL (ref 10.0–291.0)
Iron: 8 ug/dL — ABNORMAL LOW (ref 42–145)
Saturation Ratios: 2.3 % — ABNORMAL LOW (ref 20.0–50.0)
Transferrin: 251 mg/dL (ref 212.0–360.0)

## 2019-12-18 LAB — CBC WITH DIFFERENTIAL/PLATELET
Basophils Absolute: 0.1 10*3/uL (ref 0.0–0.1)
Basophils Relative: 2.1 % (ref 0.0–3.0)
Eosinophils Absolute: 0.1 10*3/uL (ref 0.0–0.7)
Eosinophils Relative: 2.6 % (ref 0.0–5.0)
HCT: 27.8 % — ABNORMAL LOW (ref 36.0–46.0)
Hemoglobin: 9.3 g/dL — ABNORMAL LOW (ref 12.0–15.0)
Lymphocytes Relative: 19.5 % (ref 12.0–46.0)
Lymphs Abs: 0.7 10*3/uL (ref 0.7–4.0)
MCHC: 33.3 g/dL (ref 30.0–36.0)
MCV: 96.8 fl (ref 78.0–100.0)
Monocytes Absolute: 0.6 10*3/uL (ref 0.1–1.0)
Monocytes Relative: 15.1 % — ABNORMAL HIGH (ref 3.0–12.0)
Neutro Abs: 2.2 10*3/uL (ref 1.4–7.7)
Neutrophils Relative %: 60.7 % (ref 43.0–77.0)
Platelets: 193 10*3/uL (ref 150.0–400.0)
RBC: 2.87 Mil/uL — ABNORMAL LOW (ref 3.87–5.11)
RDW: 15.7 % — ABNORMAL HIGH (ref 11.5–15.5)
WBC: 3.7 10*3/uL — ABNORMAL LOW (ref 4.0–10.5)

## 2019-12-18 NOTE — Progress Notes (Signed)
Subjective:    Patient ID: Crystal Cooper, female    DOB: [birth year], 86 y.o.   MRN: [id number]  HPI Crystal Cooper is a 86 year old female with a history of iron deficiency anemia due to chronic GI blood loss, history of gastric angioectasias, remote colon polyps, recent CVA on Eliquis, history of atrial fibrillation, history of aortic stenosis and CHF, hypertension who is seen for follow-up.  She is here today with her daughter.  She had a CVA in March and her neurologist recommended she resume Eliquis.  She has been using this medication at 2.5 mg twice daily.  She was admitted in [place] at [facility] with melena in April.  Dr. Oneida Alar performed an upper endoscopy on 10/12/2019.  This revealed a single angiectasia on the greater curvature of the stomach which was ablated using APC.  The exam was otherwise normal.  She has remained on oral iron which she takes twice daily.  She has been having her blood counts monitored closely as well as her iron studies.  In the past she did require IV iron.  She denies specific GI complaint today.  Her appetite has been good.  She has been using pantoprazole 40 mg twice a day and famotidine 20 mg in the morning.  She denies abdominal pain.  Her stools are dark with oral iron but she has not seen melena like she was seeing in April.  No visible blood.  Bowel movements have been regular.   Review of Systems As per HPI, otherwise negative  Current Medications, Allergies, Past Medical History, Past Surgical History, Family History and Social History were reviewed in [employer] record.     Objective:   Physical Exam BP (!) 155/68 (BP Location: Left Arm, Patient Position: Sitting, Cuff Size: Normal)   Pulse 72   Ht 5\' 1"  (1.549 m)   Wt 116 lb 8 oz (52.8 kg)   BMI 22.01 kg/m  Gen: awake, alert, NAD, elderly appearing female HEENT: anicteric CV: Irregularly irregular, 2/6 sem Pulm: CTA b/l Abd: soft, NT/ND, +BS throughout Ext:  no c/c/e  Labs reviewed in EMR     Assessment & Plan:  86 year old female with a history of iron deficiency anemia due to chronic GI blood loss, history of gastric angioectasias, remote colon polyps, recent CVA on Eliquis, history of atrial fibrillation, history of aortic stenosis and CHF, hypertension who is seen for follow-up.  1. IDA due to chronic blood loss/history of gastric angiectasia/chronic anticoagulation --we discussed her GI blood loss today.  Certainly it is exacerbated by the use of chronic anticoagulation, specifically Eliquis.  They have considered the risks and benefits of Eliquis therapy and at this time it is felt to be beneficial from a CVA standpoint.  We discussed further GI evaluation with colonoscopy but at this point I feel that this test is too high risk for her.  Her daughter is in agreement and wishes to avoid colonoscopy.  Thus we would recommend just closely monitoring of blood counts and iron studies.  She is on oral iron and if iron remains low I would recommend hematology referral for consideration of repeat IV iron as needed.  If she sees visible melena or her hemoglobin does not remain stable on Eliquis, Eliquis will need to be held or even discontinued altogether.  I feel we can reduce PPI at this point --CBC and iron studies today --Reduce pantoprazole to 40 mg once daily --Move famotidine to 20 mg in the evening --I  would recommend CBC approximately every 2 weeks with primary care, I will alert Dr. Livia Snellen to see if he will order blood counts so that she can have these done closer to home --If recurrent overt GI bleeding, melena or rectal bleeding she should notify me immediately  30 minutes total spent today including patient facing time, coordination of care, reviewing medical history/procedures/pertinent radiology studies, and documentation of the encounter.

## 2019-12-18 NOTE — Patient Instructions (Signed)
Your provider has requested that you go to the basement level for lab work before leaving today. Press "B" on the elevator. The lab is located at the first door on the left as you exit the elevator.  Decrease your pantoprazole 40 mg every morning.  Please start to taking your famotidine 20 mg every afternoon.  If you are age 84 or older, your body mass index should be between 23-30. Your Body mass index is 22.01 kg/m. If this is out of the aforementioned range listed, please consider follow up with your Primary Care Provider.  If you are age 54 or younger, your body mass index should be between 19-25. Your Body mass index is 22.01 kg/m. If this is out of the aformentioned range listed, please consider follow up with your Primary Care Provider.   Due to recent changes in healthcare laws, you may see the results of your imaging and laboratory studies on MyChart before your provider has had a chance to review them.  We understand that in some cases there may be results that are confusing or concerning to you. Not all laboratory results come back in the same time frame and the provider may be waiting for multiple results in order to interpret others.  Please give [place] 48 hours in order for your provider to thoroughly review all the results before contacting the office for clarification of your results.

## 2019-12-21 ENCOUNTER — Other Ambulatory Visit: Payer: Self-pay

## 2019-12-21 ENCOUNTER — Ambulatory Visit (INDEPENDENT_AMBULATORY_CARE_PROVIDER_SITE_OTHER): Payer: Medicare Other | Admitting: Family Medicine

## 2019-12-21 DIAGNOSIS — R410 Disorientation, unspecified: Secondary | ICD-10-CM | POA: Diagnosis not present

## 2019-12-21 DIAGNOSIS — N10 Acute tubulo-interstitial nephritis: Secondary | ICD-10-CM

## 2019-12-21 DIAGNOSIS — I6930 Unspecified sequelae of cerebral infarction: Secondary | ICD-10-CM

## 2019-12-21 DIAGNOSIS — D500 Iron deficiency anemia secondary to blood loss (chronic): Secondary | ICD-10-CM

## 2019-12-21 LAB — MICROSCOPIC EXAMINATION: WBC, UA: >30 /hpf — AB (ref 0–5)

## 2019-12-21 LAB — URINALYSIS, COMPLETE
Bilirubin, UA: NEGATIVE
Glucose, UA: NEGATIVE
Ketones, UA: NEGATIVE
Nitrite, UA: NEGATIVE
Specific Gravity, UA: 1.02 (ref 1.005–1.030)
Urobilinogen, Ur: 0.2 mg/dL (ref 0.2–1.0)
pH, UA: 6 (ref 5.0–7.5)

## 2019-12-21 MED ORDER — SULFAMETHOXAZOLE-TRIMETHOPRIM 800-160 MG PO TABS: 1.0000 | ORAL_TABLET | Freq: Two times a day (BID) | ORAL | 0 refills | Status: DC

## 2019-12-21 MED ORDER — FAMOTIDINE 20 MG PO TABS: 20.0000 mg | ORAL_TABLET | Freq: Every day | ORAL | 1 refills | Status: AC

## 2019-12-21 NOTE — Progress Notes (Signed)
Subjective:    Patient ID: ARIEN MORINE, female    DOB: June 21, 1927, 86 y.o.   MRN: [id number]   HPI: LANDRI DORSAINVIL is a 86 y.o. female presenting for urine has bad smell. Pt. Confused for 2 days. She doesn't realize she is at home. Has had this with previous UTI. Denies fever. No flank pain. Having nocturia X 3 last night. Daytime frequency.    Depression screen [facility] 2/9 12/01/2019 11/27/2019 10/27/2019 10/19/2019 09/23/2019  Decreased Interest 0 0 0 0 0  Down, Depressed, Hopeless 0 0 0 0 0  PHQ - 2 Score 0 0 0 0 0  Altered sleeping - - - - -  Tired, decreased energy - - - - -  Change in appetite - - - - -  Feeling bad or failure about yourself  - - - - -  Trouble concentrating - - - - -  Moving slowly or fidgety/restless - - - - -  Suicidal thoughts - - - - -  PHQ-9 Score - - - - -  Difficult doing work/chores - - - - -  Some recent data might be hidden     Relevant past medical, surgical, family and social history reviewed and updated as indicated.  Interim medical history since our last visit reviewed. Allergies and medications reviewed and updated.  ROS:  Review of Systems  Constitutional: Negative for chills, diaphoresis and fever.  HENT: Negative for congestion.   Respiratory: Negative for shortness of breath.   Cardiovascular: Negative for chest pain.  Gastrointestinal: Negative for abdominal pain.  Genitourinary: Positive for frequency. Negative for decreased urine volume, dysuria, flank pain and pelvic pain.  Musculoskeletal: Negative for arthralgias and joint swelling.  Skin: Negative for rash.  Neurological: Negative for dizziness and numbness.  Psychiatric/Behavioral: Positive for confusion.     Social History   Tobacco Use  Smoking Status Never Smoker  Smokeless Tobacco Never Used       Objective:     Wt Readings from Last 3 Encounters:  12/18/19 116 lb 8 oz (52.8 kg)  12/15/19 115 lb (52.2 kg)  12/13/19 112 lb (50.8 kg)     Exam deferred. Pt.  [place] due to COVID 19. Phone visit performed.   Assessment & Plan:   1. Pyelonephritis, acute   2. Confusion     Meds ordered this encounter  Medications  . sulfamethoxazole-trimethoprim (BACTRIM DS) 800-160 MG tablet    Sig: Take 1 tablet by mouth 2 (two) times daily.    Dispense:  20 tablet    Refill:  0  . famotidine (PEPCID) 20 MG tablet    Sig: Take 1 tablet (20 mg total) by mouth at bedtime.    Dispense:  90 tablet    Refill:  1    Orders Placed This Encounter  Procedures  . Urine Culture  . Urinalysis, Complete      Diagnoses and all orders for this visit:  Pyelonephritis, acute -     Urinalysis, Complete -     Urine Culture  Confusion  Other orders -     sulfamethoxazole-trimethoprim (BACTRIM DS) 800-160 MG tablet; Take 1 tablet by mouth 2 (two) times daily. -     famotidine (PEPCID) 20 MG tablet; Take 1 tablet (20 mg total) by mouth at bedtime.  Pt. Daughter to bring in urine specimen for her.  Virtual Visit via telephone Note  I discussed the limitations, risks, security and privacy concerns of performing an evaluation and management  service by telephone and the availability of in person appointments. The patient was identified with two identifiers. Pt.expressed understanding and agreed to proceed. Pt. Is at home. Dr. Livia Snellen is in his office.  Follow Up Instructions:   I discussed the assessment and treatment plan with the patient. The patient was provided an opportunity to ask questions and all were answered. The patient agreed with the plan and demonstrated an understanding of the instructions.   The patient was advised to call back or seek an in-person evaluation if the symptoms worsen or if the condition fails to improve as anticipated.   Total minutes including chart review and phone contact time: 14   Follow up plan: No follow-ups on file.  [name], MD [place]

## 2019-12-24 LAB — URINE CULTURE

## 2019-12-25 NOTE — Progress Notes (Signed)
Kindly inform patient that EEG was normal

## 2019-12-29 ENCOUNTER — Inpatient Hospital Stay (HOSPITAL_COMMUNITY): Payer: Medicare Other

## 2019-12-29 ENCOUNTER — Other Ambulatory Visit: Payer: Self-pay

## 2019-12-29 ENCOUNTER — Encounter (HOSPITAL_COMMUNITY): Payer: Self-pay | Admitting: Hematology

## 2019-12-29 ENCOUNTER — Inpatient Hospital Stay (HOSPITAL_COMMUNITY): Payer: Medicare Other | Attending: Hematology | Admitting: Hematology

## 2019-12-29 VITALS — BP 95/56 | HR 63 | Temp 98.0°F | Resp 20 | Ht 59.5 in | Wt 112.5 lb

## 2019-12-29 DIAGNOSIS — N189 Chronic kidney disease, unspecified: Secondary | ICD-10-CM | POA: Diagnosis not present

## 2019-12-29 DIAGNOSIS — M1990 Unspecified osteoarthritis, unspecified site: Secondary | ICD-10-CM | POA: Diagnosis not present

## 2019-12-29 DIAGNOSIS — I509 Heart failure, unspecified: Secondary | ICD-10-CM | POA: Insufficient documentation

## 2019-12-29 DIAGNOSIS — R011 Cardiac murmur, unspecified: Secondary | ICD-10-CM | POA: Diagnosis not present

## 2019-12-29 DIAGNOSIS — Z8673 Personal history of transient ischemic attack (TIA), and cerebral infarction without residual deficits: Secondary | ICD-10-CM | POA: Insufficient documentation

## 2019-12-29 DIAGNOSIS — I6381 Other cerebral infarction due to occlusion or stenosis of small artery: Secondary | ICD-10-CM | POA: Insufficient documentation

## 2019-12-29 DIAGNOSIS — Z79899 Other long term (current) drug therapy: Secondary | ICD-10-CM | POA: Diagnosis not present

## 2019-12-29 DIAGNOSIS — D509 Iron deficiency anemia, unspecified: Secondary | ICD-10-CM | POA: Diagnosis not present

## 2019-12-29 DIAGNOSIS — J45909 Unspecified asthma, uncomplicated: Secondary | ICD-10-CM | POA: Diagnosis not present

## 2019-12-29 DIAGNOSIS — I998 Other disorder of circulatory system: Secondary | ICD-10-CM | POA: Insufficient documentation

## 2019-12-29 DIAGNOSIS — I4891 Unspecified atrial fibrillation: Secondary | ICD-10-CM | POA: Insufficient documentation

## 2019-12-29 DIAGNOSIS — I471 Supraventricular tachycardia: Secondary | ICD-10-CM | POA: Insufficient documentation

## 2019-12-29 DIAGNOSIS — I350 Nonrheumatic aortic (valve) stenosis: Secondary | ICD-10-CM | POA: Diagnosis not present

## 2019-12-29 DIAGNOSIS — E876 Hypokalemia: Secondary | ICD-10-CM | POA: Insufficient documentation

## 2019-12-29 DIAGNOSIS — I130 Hypertensive heart and chronic kidney disease with heart failure and stage 1 through stage 4 chronic kidney disease, or unspecified chronic kidney disease: Secondary | ICD-10-CM | POA: Diagnosis not present

## 2019-12-29 DIAGNOSIS — F0390 Unspecified dementia without behavioral disturbance: Secondary | ICD-10-CM | POA: Insufficient documentation

## 2019-12-29 DIAGNOSIS — D500 Iron deficiency anemia secondary to blood loss (chronic): Secondary | ICD-10-CM | POA: Diagnosis not present

## 2019-12-29 DIAGNOSIS — Z7901 Long term (current) use of anticoagulants: Secondary | ICD-10-CM | POA: Insufficient documentation

## 2019-12-29 LAB — IRON AND TIBC
Iron: 26 ug/dL — ABNORMAL LOW (ref 28–170)
Saturation Ratios: 7 % — ABNORMAL LOW (ref 10.4–31.8)
TIBC: 357 ug/dL (ref 250–450)
UIBC: 331 ug/dL

## 2019-12-29 LAB — COMPREHENSIVE METABOLIC PANEL
ALT: 19 U/L (ref 0–44)
AST: 27 U/L (ref 15–41)
Albumin: 3.5 g/dL (ref 3.5–5.0)
Alkaline Phosphatase: 69 U/L (ref 38–126)
Anion gap: 7 (ref 5–15)
BUN: 27 mg/dL — ABNORMAL HIGH (ref 8–23)
CO2: 28 mmol/L (ref 22–32)
Calcium: 9 mg/dL (ref 8.9–10.3)
Chloride: 99 mmol/L (ref 98–111)
Creatinine, Ser: 1.71 mg/dL — ABNORMAL HIGH (ref 0.44–1.00)
GFR calc Af Amer: 30 mL/min — ABNORMAL LOW (ref >60)
GFR calc non Af Amer: 26 mL/min — ABNORMAL LOW (ref >60)
Glucose, Bld: 106 mg/dL — ABNORMAL HIGH (ref 70–99)
Potassium: 4.7 mmol/L (ref 3.5–5.1)
Sodium: 134 mmol/L — ABNORMAL LOW (ref 135–145)
Total Bilirubin: 0.4 mg/dL (ref 0.3–1.2)
Total Protein: 6.4 g/dL — ABNORMAL LOW (ref 6.5–8.1)

## 2019-12-29 LAB — CBC WITH DIFFERENTIAL/PLATELET
Abs Immature Granulocytes: 0.01 10*3/uL (ref 0.00–0.07)
Basophils Absolute: 0.1 10*3/uL (ref 0.0–0.1)
Basophils Relative: 2 %
Eosinophils Absolute: 0.1 10*3/uL (ref 0.0–0.5)
Eosinophils Relative: 1 %
HCT: 30.3 % — ABNORMAL LOW (ref 36.0–46.0)
Hemoglobin: 9.3 g/dL — ABNORMAL LOW (ref 12.0–15.0)
Immature Granulocytes: 0 %
Lymphocytes Relative: 21 %
Lymphs Abs: 0.9 10*3/uL (ref 0.7–4.0)
MCH: 31.2 pg (ref 26.0–34.0)
MCHC: 30.7 g/dL (ref 30.0–36.0)
MCV: 101.7 fL — ABNORMAL HIGH (ref 80.0–100.0)
Monocytes Absolute: 0.5 10*3/uL (ref 0.1–1.0)
Monocytes Relative: 11 %
Neutro Abs: 2.8 10*3/uL (ref 1.7–7.7)
Neutrophils Relative %: 65 %
Platelets: 234 10*3/uL (ref 150–400)
RBC: 2.98 MIL/uL — ABNORMAL LOW (ref 3.87–5.11)
RDW: 14.3 % (ref 11.5–15.5)
WBC: 4.3 10*3/uL (ref 4.0–10.5)
nRBC: 0 % (ref 0.0–0.2)

## 2019-12-29 LAB — SAVE SMEAR(SSMR), FOR PROVIDER SLIDE REVIEW

## 2019-12-29 LAB — DIRECT ANTIGLOBULIN TEST (NOT AT ARMC)
DAT, IgG: NEGATIVE
DAT, complement: NEGATIVE

## 2019-12-29 LAB — LACTATE DEHYDROGENASE: LDH: 210 U/L — ABNORMAL HIGH (ref 98–192)

## 2019-12-29 LAB — FERRITIN: Ferritin: 22 ng/mL (ref 11–307)

## 2019-12-29 LAB — VITAMIN B12: Vitamin B-12: 582 pg/mL (ref 180–914)

## 2019-12-29 LAB — FOLATE: Folate: 37.6 ng/mL (ref >5.9)

## 2019-12-29 LAB — VITAMIN D 25 HYDROXY (VIT D DEFICIENCY, FRACTURES): Vit D, 25-Hydroxy: 58.55 ng/mL (ref 30–100)

## 2019-12-29 NOTE — Progress Notes (Signed)
CONSULT NOTE  Patient Care Team: [name], MD as PCP - General (Family Medicine) Ander Slade, Carlisle Beers, MD as Referring Physician (Ophthalmology) Stanford Breed Denice Bors, MD as Consulting Physician (Cardiology) Constance Haw, MD as Consulting Physician (Cardiology) Ilean China, RN as Registered Nurse ([place]) Shea Evans, [name], LCSW as Social Worker (Licensed Clinical Social Worker)  CHIEF COMPLAINTS/PURPOSE OF CONSULTATION: Anemia  HISTORY OF PRESENTING ILLNESS:  Crystal Cooper 86 y.o. female was sent here by her primary care doctor for anemia.  Patient reports she has been taking oral iron tablets twice a day for years now.  She reports she has always had problems with anemia in the past.  She reports they got worse when she was started on Eliquis for her A. fib.  She had multiple episodes of bleeding and the Eliquis was stopped for a period of time.  She then had a stroke.  They placed her back on the Eliquis.  She is now having bleeding issues again.  They do not want to put her through another colonoscopy at this time.  She has had multiple iron infusions in the past which have helped.  She is also tolerating the oral iron well she does need stool softeners with them.  She denies any alcohol smoking or illicit drug use.  She denies a history of blood clots.  She does have chronic kidney disease baseline creatinine is between 1.3 and 1.6.  She denies any other new medications.  Patient reports denies any recent bleeding such as epistasis, hematuria or hematochezia.  She has no prior history or diagnosis of cancer.  She denies any pica needs variety of diet.  Patient reports a family history of a daughter with cervical cancer.  MEDICAL HISTORY:  Past Medical History:  Diagnosis Date   AKI (acute kidney injury) ([place])    Anemia    years ago   Aortic stenosis    Arthritis    Asthma    Atrial fibrillation (HCC)    CHF (congestive heart failure) ([place]) 11/2014    CKD (chronic kidney disease)    Dementia (HCC)    Family history of adverse reaction to anesthesia    2 daughters would have N/V   Gastric AVM    GI bleed    Glaucoma    Hearing loss    Heart murmur    HTN (hypertension)    Hypokalemia    Pneumonia    Prolapsing mitral leaflet syndrome    Shortness of breath dyspnea    with exertion   Stroke [facility])    SVT (supraventricular tachycardia) (HCC)    S/P ablation of AVNRT in 2003    SURGICAL HISTORY: Past Surgical History:  Procedure Laterality Date   APPENDECTOMY     BIOPSY  04/07/2018   Procedure: BIOPSY;  Surgeon: Jerene Bears, MD;  Location: [facility];  Service: Gastroenterology;;   BLADDER SURGERY     CARDIAC CATHETERIZATION N/A 09/26/2015   Procedure: Right/Left Heart Cath and Coronary Angiography;  Surgeon: Burnell Blanks, MD;  Location: [facility];  Service: Cardiovascular;  Laterality: N/A;   CARDIAC SURGERY     CARDIOVERSION N/A 03/02/2016   Procedure: CARDIOVERSION;  Surgeon: Thayer Headings, MD;  Location: [place] [facility];  Service: Cardiovascular;  Laterality: N/A;   EP IMPLANTABLE DEVICE N/A 10/26/2015   Procedure: Pacemaker Implant;  Surgeon: Will Meredith Leeds, MD;  Location: [facility] [facility];  Service: Cardiovascular;  Laterality: N/A;   ESOPHAGOGASTRODUODENOSCOPY (EGD) WITH  PROPOFOL N/A 04/07/2018   Procedure: ESOPHAGOGASTRODUODENOSCOPY (EGD) WITH PROPOFOL;  Surgeon: Jerene Bears, MD;  Location: [facility] END[facility];  Service: Gastroenterology;  Laterality: N/A;   ESOPHAGOGASTRODUODENOSCOPY (EGD) WITH PROPOFOL N/A 10/12/2019   Procedure: ESOPHAGOGASTRODUODENOSCOPY (EGD) WITH PROPOFOL;  Surgeon: Danie Binder, MD;  [facility];  Service: Endoscopy;  Laterality: N/A;   EYE SURGERY Bilateral    cataract surgery   HOT HEMOSTASIS N/A 04/07/2018   Procedure: HOT HEMOSTASIS (ARGON PLASMA COAGULATION/BICAP);  Surgeon: Jerene Bears, MD;  Location: [facility] ENDOSCOPY;  Service:  Gastroenterology;  Laterality: N/A;   HOT HEMOSTASIS  10/12/2019   Procedure: HOT HEMOSTASIS (ARGON PLASMA COAGULATION/BICAP);  Surgeon: Danie Binder, MD;  [facility];  Service: Endoscopy;;   NASAL SINUS SURGERY     PACEMAKER INSERTION     TEE WITHOUT CARDIOVERSION N/A 10/25/2015   Procedure: TRANSESOPHAGEAL ECHOCARDIOGRAM (TEE);  Surgeon: Burnell Blanks, MD;  Location: [place];  Service: Open Heart Surgery;  Laterality: N/A;   TRANSCATHETER AORTIC VALVE REPLACEMENT, TRANSFEMORAL Right 10/25/2015   Procedure: TRANSCATHETER AORTIC VALVE REPLACEMENT, TRANSFEMORAL;  Surgeon: Burnell Blanks, MD;  Location: [place];  Service: Open Heart Surgery;  Laterality: Right;    SOCIAL HISTORY: Social History   Socioeconomic History   Marital status: Widowed    Spouse name: Not on file   Number of children: 3   Years of education: Not on file   Highest education level: 12th grade  Occupational History   Occupation: Retired  Tobacco Use   Smoking status: Never Smoker   Smokeless tobacco: Never Used  Scientific laboratory technician Use: Never used  Substance and Sexual Activity   Alcohol use: No    Alcohol/week: 0.0 standard drinks   Drug use: No   Sexual activity: Not Currently  Other Topics Concern   Not on file  Social History Narrative   Not on file   Social Determinants of Health   Financial Resource Strain: Low Risk    Difficulty of Paying Living Expenses: Not hard at all  Food Insecurity: No Food Insecurity   Worried About Charity fundraiser in the Last Year: Never true   [place] in the Last Year: Never true  Transportation Needs: No Transportation Needs   Lack of Transportation (Medical): No   Lack of Transportation (Non-Medical): No  Physical Activity: Inactive   Days of Exercise per Week: 0 days   Minutes of Exercise per Session: 0 min  Stress: No Stress Concern Present   Feeling of Stress : Not at all  Social Connections:  Moderately Integrated   Frequency of Communication with Friends and Family: More than three times a week   Frequency of Social Gatherings with Friends and Family: More than three times a week   Attends Religious Services: More than 4 times per year   Active Member of [employer] or Organizations: Yes   Attends Archivist Meetings: More than 4 times per year   Marital Status: Widowed  Human resources officer Violence: Not At Risk   Fear of Current or Ex-Partner: No   Emotionally Abused: No   Physically Abused: No   Sexually Abused: No    FAMILY HISTORY: Family History  Problem Relation Age of Onset   Hypertension Mother    Pneumonia Father    Stomach cancer Brother    Stroke Brother    AAA (abdominal aortic aneurysm) Brother    Cervical cancer Daughter    Heart attack Neg  Hx    Colon cancer Neg Hx    Esophageal cancer Neg Hx     ALLERGIES:  is allergic to hctz [hydrochlorothiazide], aspirin, and codeine.  MEDICATIONS:  Current Outpatient Medications  Medication Sig Dispense Refill   apixaban (ELIQUIS) 5 MG TABS tablet Take 2.5 mg by mouth 2 (two) times daily. 1/2 tab in the morning and 1/2 tab in the evening     Biotin 10 MG CAPS Take 10 mg by mouth daily.      Budeson-Glycopyrrol-Formoterol (BREZTRI AEROSPHERE) 160-9-4.8 MCG/ACT AERO Inhale 2 Inhalers into the lungs in the morning and at bedtime. 5.9 g 1   Calcium Carb-Cholecalciferol (CALTRATE 600+D3) 600-800 MG-UNIT TABS Take 1 tablet by mouth daily.     ciprofloxacin (CIPRO) 250 MG tablet Take by mouth.     famotidine (PEPCID) 20 MG tablet Take 1 tablet (20 mg total) by mouth at bedtime. 90 tablet 1   ferrous sulfate 325 (65 FE) MG tablet Take 1 tablet (325 mg total) by mouth 2 (two) times daily with a meal.  3   furosemide (LASIX) 20 MG tablet Take 3 tablets (60 mg total) by mouth daily. 270 tablet 3   levothyroxine (SYNTHROID) 25 MCG tablet Take 1 tablet (25 mcg total) by mouth daily. 30 tablet 2    memantine (NAMENDA) 10 MG tablet Take 1 tablet (10 mg total) by mouth 2 (two) times daily. 60 tablet 0   metoprolol tartrate (LOPRESSOR) 25 MG tablet Take 1 tablet (25 mg total) by mouth 2 (two) times daily. 180 tablet 3   multivitamin-iron-minerals-folic acid (CENTRUM) chewable tablet Chew 1 tablet by mouth daily.     OVER THE COUNTER MEDICATION Take 0.5 tablets by mouth daily as needed (for allergy). Patient not sure about the name.     pantoprazole (PROTONIX) 40 MG tablet Take 1 tablet (40 mg total) by mouth 2 (two) times daily before a meal. 60 tablet 0   potassium chloride SA (KLOR-CON) 20 MEQ tablet Take 1 tablet (20 mEq total) by mouth daily. 90 tablet 3   sulfamethoxazole-trimethoprim (BACTRIM DS) 800-160 MG tablet Take 1 tablet by mouth 2 (two) times daily. 20 tablet 0   acetaminophen (TYLENOL) 500 MG tablet Take 500 mg by mouth every 6 (six) hours as needed for mild pain or headache.  (Patient not taking: Reported on 12/29/2019)     albuterol (PROAIR HFA) 108 (90 Base) MCG/ACT inhaler Inhale 2 puffs into the lungs every 4 (four) hours as needed for wheezing or shortness of breath. (Patient not taking: Reported on 12/29/2019) 18 g 11   No current facility-administered medications for this visit.    REVIEW OF SYSTEMS:   Constitutional: Denies fevers, chills or abnormal night sweats Respiratory: Denies dyspnea or wheezes, +SOB with exertion Cardiovascular: Denies palpitation, chest discomfort or lower extremity swelling Gastrointestinal:  Denies nausea, heartburn or change in bowel habits Skin: Denies abnormal skin rashes Lymphatics: Denies new lymphadenopathy or easy bruising Neurological:Denies numbness, tingling or new weaknesses Behavioral/Psych: Mood is stable, no new changes, + anxiety and depression All other systems were reviewed with the patient and are negative.  PHYSICAL EXAMINATION: ECOG PERFORMANCE STATUS: 1 - Symptomatic but completely ambulatory  Vitals:    12/29/19 1334  BP: (!) 95/56  Pulse: 63  Resp: 20  Temp: 98 F (36.7 C)  SpO2: 100%   Filed Weights   12/29/19 1334  Weight: 112 lb 8 oz (51 kg)    GENERAL:alert, no distress and comfortable SKIN: skin color, texture, turgor  are normal, no rashes or significant lesions NECK: supple, thyroid normal size, non-tender, without nodularity LYMPH:  no palpable lymphadenopathy in the cervical, axillary or inguinal LUNGS: clear to auscultation and percussion with normal breathing effort HEART: regular rate & rhythm and no murmurs and no lower extremity edema ABDOMEN:abdomen soft, non-tender and normal bowel sounds Musculoskeletal:no cyanosis of digits and no clubbing  PSYCH: alert & oriented x 3 with fluent speech NEURO: no focal motor/sensory deficits  LABORATORY DATA:  I have reviewed the data as listed Recent Results (from the past 2160 hour(s))  Type and screen [facility] [place]     Status: None   Collection Time: 10/10/19  2:38 PM  Result Value Ref Range   ABO/RH(D) A POS    Antibody Screen NEG    Sample Expiration 10/13/2019,2359    Unit Number [id number]    Blood Component Type RED CELLS,LR    Unit division 00    Status of Unit ISSUED,FINAL    Transfusion Status OK TO TRANSFUSE    Crossmatch Result      Compatible Performed at [facility], [address], [place], [place] [address]   CBC with Differential     Status: Abnormal   Collection Time: 10/10/19  2:38 PM  Result Value Ref Range   WBC 7.7 4.0 - 10.5 K/uL   RBC 2.46 (L) 3.87 - 5.11 MIL/uL   Hemoglobin 7.6 (L) 12.0 - 15.0 g/dL   HCT 25.1 (L) 36 - 46 %   MCV 102.0 (H) 80.0 - 100.0 fL   MCH 30.9 26.0 - 34.0 pg   MCHC 30.3 30.0 - 36.0 g/dL   RDW 13.4 11.5 - 15.5 %   Platelets 297 150 - 400 K/uL   nRBC 0.0 0.0 - 0.2 %   Neutrophils Relative % 88 %   Neutro Abs 6.7 1.7 - 7.7 K/uL   Lymphocytes Relative 8 %   Lymphs Abs 0.6 (L) 0.7 - 4.0 K/uL   Monocytes Relative 2 %   Monocytes Absolute 0.2 0 - 1 K/uL    Eosinophils Relative 0 %   Eosinophils Absolute 0.0 0 - 0 K/uL   Basophils Relative 0 %   Basophils Absolute 0.0 0 - 0 K/uL   Immature Granulocytes 2 %   Abs Immature Granulocytes 0.15 (H) 0.00 - 0.07 K/uL    Comment: Performed at [facility], [address] [place], [place] [address]  Basic metabolic panel     Status: Abnormal   Collection Time: 10/10/19  2:38 PM  Result Value Ref Range   Sodium 133 (L) 135 - 145 mmol/L   Potassium 3.7 3.5 - 5.1 mmol/L   Chloride 96 (L) 98 - 111 mmol/L   CO2 28 22 - 32 mmol/L   Glucose, Bld 148 (H) 70 - 99 mg/dL    Comment: Glucose reference range applies only to samples taken after fasting for at least 8 hours.   BUN 29 (H) 8 - 23 mg/dL   Creatinine, Ser 1.60 (H) 0.44 - 1.00 mg/dL   Calcium 8.5 (L) 8.9 - 10.3 mg/dL   GFR calc non Af Amer 28 (L) >60 mL/min   GFR calc Af Amer 33 (L) >60 mL/min   Anion gap 9 5 - 15    Comment: Performed at [facility], [address] [place], [place] [address]  BPAM RBC     Status: None   Collection Time: 10/10/19  2:38 PM  Result Value Ref Range   ISSUE DATE / TIME  [id number]    Blood Product Unit Number [id number]    PRODUCT CODE [id number]    Unit Type and Rh 2536    Blood Product Expiration Date 644034742595   SARS CORONAVIRUS 2 (TAT 6-24 HRS) Nasopharyngeal Nasopharyngeal Swab     Status: None   Collection Time: 10/10/19  3:38 PM   Specimen: Nasopharyngeal Swab  Result Value Ref Range   SARS Coronavirus 2 NEGATIVE NEGATIVE    Comment: (NOTE) SARS-CoV-2 target nucleic acids are NOT DETECTED. The SARS-CoV-2 RNA is generally detectable in upper and lower respiratory specimens during the acute phase of infection. Negative results do not preclude SARS-CoV-2 infection, do not rule out co-infections with other pathogens, and should not be used as the sole basis for treatment or other patient management decisions. Negative results must be combined with clinical observations, patient history, and  epidemiological information. The expected result is Negative. Fact Sheet for Patients: SugarRoll.be Fact Sheet for Healthcare Providers: https://www.woods-mathews.com/ This test is not yet approved or cleared by the [place] FDA and  has been authorized for detection and/or diagnosis of SARS-CoV-2 by FDA under an Emergency Use Authorization (EUA). This EUA will remain  in effect (meaning this test can be used) for the duration of the COVID-19 declaration under Section 56 4(b)(1) of the Act, 21 U.S.C. section 360bbb-3(b)(1), unless the authorization is terminated or revoked sooner. Performed at [facility], [place] [address] [place], [place] [address]   ABO/Rh     Status: None   Collection Time: 10/10/19  4:00 PM  Result Value Ref Range   ABO/RH(D)      A POS Performed at [facility], [address], [place], [place] [address]   Vitamin B12     Status: None   Collection Time: 10/10/19  4:00 PM  Result Value Ref Range   Vitamin B-12 750 180 - 914 pg/mL    Comment: (NOTE) This assay is not validated for testing neonatal or myeloproliferative syndrome specimens for Vitamin B12 levels. Performed at [facility], [address] [place], [place] [address]   Iron and TIBC     Status: None   Collection Time: 10/10/19  4:00 PM  Result Value Ref Range   Iron 52 28 - 170 ug/dL   TIBC 349 250 - 450 ug/dL   Saturation Ratios 15 10.4 - 31.8 %   UIBC 297 ug/dL    Comment: Performed at [facility], [address], [place], [place] [address]  Ferritin     Status: None   Collection Time: 10/10/19  4:00 PM  Result Value Ref Range   Ferritin 17 11 - 307 ng/mL    Comment: Performed at [facility], [address], [place], [place] [address]  Prepare RBC (crossmatch)     Status: None   Collection Time: 10/10/19  4:28 PM  Result Value Ref Range   Order Confirmation      ORDER PROCESSED BY BLOOD BANK Performed at [facility], [address] [place], [place] [address]   Reticulocytes     Status: Abnormal   Collection Time: 10/10/19  5:30 PM  Result Value Ref Range   Retic Ct Pct 4.6 (H) 0.4 - 3.1 %   RBC. 2.44 (L) 3.87 - 5.11 MIL/uL   Retic Count, Absolute 112.7 19.0 - 186.0 K/uL   Immature Retic Fract 31.9 (H) 2.3 - 15.9 %    Comment: Performed at [facility], [address] [place], [place] [address]  Folate  Status: None   Collection Time: 10/11/19  5:41 AM  Result Value Ref Range   Folate 26.1 >5.9 ng/mL    Comment: RESULTS CONFIRMED BY MANUAL DILUTION Performed at [facility], [address], [place], [place] [address]   Basic metabolic panel     Status: Abnormal   Collection Time: 10/11/19  5:41 AM  Result Value Ref Range   Sodium 136 135 - 145 mmol/L   Potassium 3.2 (L) 3.5 - 5.1 mmol/L   Chloride 95 (L) 98 - 111 mmol/L   CO2 30 22 - 32 mmol/L   Glucose, Bld 85 70 - 99 mg/dL    Comment: Glucose reference range applies only to samples taken after fasting for at least 8 hours.   BUN 23 8 - 23 mg/dL   Creatinine, Ser 1.23 (H) 0.44 - 1.00 mg/dL   Calcium 8.6 (L) 8.9 - 10.3 mg/dL   GFR calc non Af Amer 39 (L) >60 mL/min   GFR calc Af Amer 45 (L) >60 mL/min   Anion gap 11 5 - 15    Comment: Performed at [facility], [address] [place], [place] [address]  CBC     Status: Abnormal   Collection Time: 10/11/19  5:41 AM  Result Value Ref Range   WBC 7.2 4.0 - 10.5 K/uL   RBC 2.65 (L) 3.87 - 5.11 MIL/uL   Hemoglobin 8.3 (L) 12.0 - 15.0 g/dL   HCT 25.8 (L) 36 - 46 %   MCV 97.4 80.0 - 100.0 fL   MCH 31.3 26.0 - 34.0 pg   MCHC 32.2 30.0 - 36.0 g/dL   RDW 14.3 11.5 - 15.5 %   Platelets 280 150 - 400 K/uL   nRBC 0.0 0.0 - 0.2 %    Comment: Performed at [facility], [address], [place], [place] [address]  Magnesium     Status: None   Collection Time: 10/12/19  5:30 AM  Result Value Ref Range   Magnesium 2.0 1.7 - 2.4 mg/dL    Comment: Performed at [facility], [address] [place], [place] [address]   CBC     Status: Abnormal   Collection Time: 10/12/19  5:30 AM  Result Value Ref Range   WBC 6.4 4.0 - 10.5 K/uL   RBC 2.78 (L) 3.87 - 5.11 MIL/uL   Hemoglobin 8.7 (L) 12.0 - 15.0 g/dL   HCT 27.6 (L) 36 - 46 %   MCV 99.3 80.0 - 100.0 fL   MCH 31.3 26.0 - 34.0 pg   MCHC 31.5 30.0 - 36.0 g/dL   RDW 13.8 11.5 - 15.5 %   Platelets 300 150 - 400 K/uL   nRBC 0.0 0.0 - 0.2 %    Comment: Performed at [facility], [address], [place], [place] [address]  Basic metabolic panel     Status: Abnormal   Collection Time: 10/12/19  5:30 AM  Result Value Ref Range   Sodium 133 (L) 135 - 145 mmol/L   Potassium 4.6 3.5 - 5.1 mmol/L    Comment: DELTA CHECK NOTED   Chloride 95 (L) 98 - 111 mmol/L   CO2 29 22 - 32 mmol/L   Glucose, Bld 90 70 - 99 mg/dL    Comment: Glucose reference range applies only to samples taken after fasting for at least 8 hours.   BUN 16 8 - 23 mg/dL   Creatinine, Ser 1.30 (H) 0.44 - 1.00 mg/dL   Calcium 8.5 (L) 8.9 - 10.3 mg/dL  GFR calc non Af Amer 36 (L) >60 mL/min   GFR calc Af Amer 42 (L) >60 mL/min   Anion gap 9 5 - 15    Comment: Performed at [facility], [address] [place], [place] [address]  Comprehensive metabolic panel     Status: Abnormal   Collection Time: 10/15/19  3:18 PM  Result Value Ref Range   Sodium 130 (L) 135 - 145 mmol/L   Potassium 3.7 3.5 - 5.1 mmol/L   Chloride 94 (L) 98 - 111 mmol/L   CO2 28 22 - 32 mmol/L   Glucose, Bld 115 (H) 70 - 99 mg/dL    Comment: Glucose reference range applies only to samples taken after fasting for at least 8 hours.   BUN 24 (H) 8 - 23 mg/dL   Creatinine, Ser 1.68 (H) 0.44 - 1.00 mg/dL   Calcium 8.3 (L) 8.9 - 10.3 mg/dL   Total Protein 5.5 (L) 6.5 - 8.1 g/dL   Albumin 2.8 (L) 3.5 - 5.0 g/dL   AST 26 15 - 41 U/L   ALT 24 0 - 44 U/L   Alkaline Phosphatase 61 38 - 126 U/L   Total Bilirubin 0.6 0.3 - 1.2 mg/dL   GFR calc non Af Amer 26 (L) >60 mL/min   GFR calc Af Amer 31 (L) >60 mL/min   Anion gap 8 5 -  15    Comment: Performed at [facility] [address] [place], [place] [address]  CBC     Status: Abnormal   Collection Time: 10/15/19  3:18 PM  Result Value Ref Range   WBC 9.7 4.0 - 10.5 K/uL   RBC 2.80 (L) 3.87 - 5.11 MIL/uL   Hemoglobin 8.5 (L) 12.0 - 15.0 g/dL   HCT 27.0 (L) 36 - 46 %   MCV 96.4 80.0 - 100.0 fL   MCH 30.4 26.0 - 34.0 pg   MCHC 31.5 30.0 - 36.0 g/dL   RDW 13.0 11.5 - 15.5 %   Platelets 232 150 - 400 K/uL   nRBC 0.0 0.0 - 0.2 %    Comment: Performed at [facility], [place] [address] [place], [place] [address]  Type and screen [place]     Status: None   Collection Time: 10/15/19  3:18 PM  Result Value Ref Range   ABO/RH(D) A POS    Antibody Screen NEG    Sample Expiration      10/18/2019,2359 Performed at [facility], [place] [address], [place], [place] [address]   Urinalysis, Complete     Status: Abnormal   Collection Time: 10/19/19  2:31 PM  Result Value Ref Range   Specific Gravity, UA 1.010 1.005 - 1.030   pH, UA 5.5 5.0 - 7.5   Color, UA Yellow Yellow   Appearance Ur Clear Clear   Leukocytes,UA Trace (A) Negative   Protein,UA Negative Negative/Trace   Glucose, UA Negative Negative   Ketones, UA Negative Negative   RBC, UA Trace (A) Negative   Bilirubin, UA Negative Negative   Urobilinogen, Ur 0.2 0.2 - 1.0 mg/dL   Nitrite, UA Negative Negative   Microscopic Examination See below:   Microscopic Examination     Status: None   Collection Time: 10/19/19  2:31 PM   URINE  Result Value Ref Range   WBC, UA 0-5 0 - 5 /hpf   RBC 0-2 0 - 2 /hpf   Epithelial Cells (non renal) 0-10 0 -  10 /hpf   Renal Epithel, UA None seen None seen /hpf   Bacteria, UA None seen None seen/Few  CMP14+EGFR     Status: Abnormal   Collection Time: 10/19/19  3:03 PM  Result Value Ref Range   Glucose 95 65 - 99 mg/dL   BUN 19 10 - 36 mg/dL   Creatinine, Ser 1.38 (H) 0.57 - 1.00 mg/dL   GFR calc non Af Amer 34 (L) >59 mL/min/1.73    GFR calc Af Amer 39 (L) >59 mL/min/1.73    Comment: **[facility] currently reports eGFR in compliance with the current**   recommendations of the [employer]. [facility] will   update reporting as new guidelines are published from the NKF-ASN   Task force.    BUN/Creatinine Ratio 14 12 - 28   Sodium 136 134 - 144 mmol/L   Potassium 4.4 3.5 - 5.2 mmol/L   Chloride 97 96 - 106 mmol/L   CO2 25 20 - 29 mmol/L   Calcium 9.0 8.7 - 10.3 mg/dL   Total Protein 6.3 6.0 - 8.5 g/dL   Albumin 3.7 3.5 - 4.6 g/dL   Globulin, Total 2.6 1.5 - 4.5 g/dL   Albumin/Globulin Ratio 1.4 1.2 - 2.2   Bilirubin Total 0.3 0.0 - 1.2 mg/dL   Alkaline Phosphatase 86 39 - 117 IU/L   AST 30 0 - 40 IU/L   ALT 22 0 - 32 IU/L  CBC with Differential/Platelet     Status: Abnormal   Collection Time: 10/19/19  3:03 PM  Result Value Ref Range   WBC 7.5 3.4 - 10.8 x10E3/uL   RBC 2.89 (L) 3.77 - 5.28 x10E6/uL   Hemoglobin 9.1 (L) 11.1 - 15.9 g/dL   Hematocrit 27.8 (L) 34.0 - 46.6 %   MCV 96 79 - 97 fL   MCH 31.5 26.6 - 33.0 pg   MCHC 32.7 31 - 35 g/dL   RDW 12.5 11.7 - 15.4 %   Platelets 275 150 - 450 x10E3/uL   Neutrophils 70 Not Estab. %   Lymphs 16 Not Estab. %   Monocytes 10 Not Estab. %   Eos 2 Not Estab. %   Basos 1 Not Estab. %   Neutrophils Absolute 5.3 1 - 7 x10E3/uL   Lymphocytes Absolute 1.2 0 - 3 x10E3/uL   Monocytes Absolute 0.7 0 - 0 x10E3/uL   EOS (ABSOLUTE) 0.2 0.0 - 0.4 x10E3/uL   Basophils Absolute 0.1 0 - 0 x10E3/uL   Immature Granulocytes 1 Not Estab. %   Immature Grans (Abs) 0.1 0.0 - 0.1 x10E3/uL  Urine Culture     Status: Abnormal   Collection Time: 10/19/19  3:08 PM   Specimen: Urine   UR  Result Value Ref Range   Urine Culture, Routine Final report (A)    Organism ID, Bacteria Enterococcus faecalis (A)     Comment: 10,000-25,000 colony forming units per mL   Antimicrobial Susceptibility Comment     Comment:       ** S = Susceptible; I = Intermediate; R = Resistant **                     P = Positive; N = Negative             MICS are expressed in micrograms per mL    Antibiotic                 RSLT#1    RSLT#2    RSLT#3  RSLT#4 Ciprofloxacin                  I Levofloxacin                   S Nitrofurantoin                 S Penicillin                     S Tetracycline                   R Vancomycin                     S   CUP PACEART REMOTE DEVICE CHECK     Status: None   Collection Time: 11/11/19  9:12 AM  Result Value Ref Range   Date Time Interrogation Session [phone]    Pulse Generator Manufacturer MERM    Pulse Gen Model [id number] Advisa DR MRI    Pulse Gen Serial Number [id number]    Clinic Name [facility] Pulse Generator Type Implantable Pulse Generator    Implantable Pulse Generator Implant Date [id number]    Implantable Lead Manufacturer MERM    Implantable Lead Model 5076 CapSureFix Novus MRI SureScan    Implantable Lead Serial Number [id number]    Implantable Lead Implant Date [id number]    Implantable Lead Location Detail 1 UNKNOWN    Implantable Lead Location [id number]    Implantable Lead Manufacturer MERM    Implantable Lead Model 5076 CapSureFix Novus MRI SureScan    Implantable Lead Serial Number [id number]    Implantable Lead Implant Date [id number]    Implantable Lead Location Detail 1 UNKNOWN    Implantable Lead Location [id number]    Lead Channel Setting Sensing Sensitivity 2.8 mV   Lead Channel Setting Pacing Amplitude 2 V   Lead Channel Setting Pacing Pulse Width 0.4 ms   Lead Channel Setting Pacing Amplitude 2.5 V   Lead Channel Impedance Value 418 ohm   Lead Channel Impedance Value 285 ohm   Lead Channel Sensing Intrinsic Amplitude 4.25 mV   Lead Channel Sensing Intrinsic Amplitude 4.25 mV   Lead Channel Pacing Threshold Amplitude 1 V   Lead Channel Pacing Threshold Pulse Width 0.4 ms   Lead Channel Impedance Value 418 ohm   Lead Channel Impedance Value 342 ohm   Lead Channel Sensing Intrinsic Amplitude  9.875 mV   Lead Channel Sensing Intrinsic Amplitude 9.875 mV   Lead Channel Pacing Threshold Amplitude 1.125 V   Lead Channel Pacing Threshold Pulse Width 0.4 ms   Battery Status OK    Battery Remaining Longevity 55 mo   Battery Voltage 2.99 V   Brady Statistic RA Percent Paced 0 %   Brady Statistic RV Percent Paced 89.93 %   Brady Statistic AP VP Percent 0 %   Brady Statistic AS VP Percent 89.7 %   Brady Statistic AP VS Percent 0 %   Brady Statistic AS VS Percent 10.3 %  Dementia Panel     Status: Abnormal   Collection Time: 11/11/19  2:47 PM  Result Value Ref Range   Vitamin B-12 927 232 - 1,245 pg/mL   Homocysteine 13.7 0.0 - 21.3 umol/L   TSH 4.680 (H) 0.450 - 4.500 uIU/mL   RPR Ser Ql Non Reactive Non Reactive  TSH + free T4     Status: Abnormal   Collection Time: 12/01/19 10:36 AM  Result Value Ref Range   TSH 6.540 (H) 0.450 - 4.500 uIU/mL   Free T4 1.41 0.82 - 1.77 ng/dL  CBC with Differential/Platelet     Status: Abnormal   Collection Time: 12/01/19 10:36 AM  Result Value Ref Range   WBC 5.1 3.4 - 10.8 x10E3/uL   RBC 2.89 (L) 3.77 - 5.28 x10E6/uL   Hemoglobin 8.8 (LL) 11.1 - 15.9 g/dL    Comment:                   Client Requested Flag   Hematocrit 27.3 (L) 34.0 - 46.6 %   MCV 95 79 - 97 fL   MCH 30.4 26.6 - 33.0 pg   MCHC 32.2 31 - 35 g/dL   RDW 13.1 11.7 - 15.4 %   Platelets 227 150 - 450 x10E3/uL   Neutrophils 67 Not Estab. %   Lymphs 19 Not Estab. %   Monocytes 10 Not Estab. %   Eos 2 Not Estab. %   Basos 2 Not Estab. %   Neutrophils Absolute 3.4 1 - 7 x10E3/uL   Lymphocytes Absolute 1.0 0 - 3 x10E3/uL   Monocytes Absolute 0.5 0 - 0 x10E3/uL   EOS (ABSOLUTE) 0.1 0.0 - 0.4 x10E3/uL   Basophils Absolute 0.1 0 - 0 x10E3/uL   Immature Granulocytes 0 Not Estab. %   Immature Grans (Abs) 0.0 0.0 - 0.1 x10E3/uL  CMP14+EGFR     Status: Abnormal   Collection Time: 12/01/19 10:36 AM  Result Value Ref Range   Glucose 94 65 - 99 mg/dL   BUN 28 10 - 36 mg/dL    Creatinine, Ser 1.53 (H) 0.57 - 1.00 mg/dL   GFR calc non Af Amer 30 (L) >59 mL/min/1.73   GFR calc Af Amer 34 (L) >59 mL/min/1.73    Comment: **[facility] currently reports eGFR in compliance with the current**   recommendations of the [employer]. [facility] will   update reporting as new guidelines are published from the NKF-ASN   Task force.    BUN/Creatinine Ratio 18 12 - 28   Sodium 137 134 - 144 mmol/L   Potassium 4.0 3.5 - 5.2 mmol/L   Chloride 97 96 - 106 mmol/L   CO2 27 20 - 29 mmol/L   Calcium 8.7 8.7 - 10.3 mg/dL   Total Protein 6.2 6.0 - 8.5 g/dL   Albumin 4.0 3.5 - 4.6 g/dL   Globulin, Total 2.2 1.5 - 4.5 g/dL   Albumin/Globulin Ratio 1.8 1.2 - 2.2   Bilirubin Total 0.3 0.0 - 1.2 mg/dL   Alkaline Phosphatase 75 48 - 121 IU/L   AST 25 0 - 40 IU/L   ALT 16 0 - 32 IU/L  SARS Coronavirus 2 by RT PCR (hospital order, performed in [place] hospital lab) Nasopharyngeal Nasopharyngeal Swab     Status: None   Collection Time: 12/13/19  6:20 AM   Specimen: Nasopharyngeal Swab  Result Value Ref Range   SARS Coronavirus 2 NEGATIVE NEGATIVE    Comment: (NOTE) SARS-CoV-2 target nucleic acids are NOT DETECTED.  The SARS-CoV-2 RNA is generally detectable in upper and lower respiratory specimens during the acute phase of infection. The lowest concentration of SARS-CoV-2 viral copies this assay can detect is 250 copies / mL. A negative result does not preclude SARS-CoV-2 infection and should not be used as the sole basis for treatment or other patient management decisions.  A negative result may occur with improper specimen collection / handling, submission of  specimen other than nasopharyngeal swab, presence of viral mutation(s) within the areas targeted by this assay, and inadequate number of viral copies (<250 copies / mL). A negative result must be combined with clinical observations, patient history, and epidemiological information.  Fact Sheet for Patients:    StrictlyIdeas.no  Fact Sheet for Healthcare Providers: BankingDealers.co.za  This test is not yet approved or  cleared by the [place] FDA and has been authorized for detection and/or diagnosis of SARS-CoV-2 by FDA under an Emergency Use Authorization (EUA).  This EUA will remain in effect (meaning this test can be used) for the duration of the COVID-19 declaration under Section 564(b)(1) of the Act, 21 U.S.C. section 360bbb-3(b)(1), unless the authorization is terminated or revoked sooner.  Performed at [facility], [address], [place], [place] [address]   CBC with Differential     Status: Abnormal   Collection Time: 12/13/19  6:25 AM  Result Value Ref Range   WBC 4.6 4.0 - 10.5 K/uL   RBC 2.80 (L) 3.87 - 5.11 MIL/uL   Hemoglobin 8.9 (L) 12.0 - 15.0 g/dL   HCT 28.5 (L) 36 - 46 %   MCV 101.8 (H) 80.0 - 100.0 fL   MCH 31.8 26.0 - 34.0 pg   MCHC 31.2 30.0 - 36.0 g/dL   RDW 15.9 (H) 11.5 - 15.5 %   Platelets 161 150 - 400 K/uL   nRBC 0.0 0.0 - 0.2 %   Neutrophils Relative % 69 %   Neutro Abs 3.2 1.7 - 7.7 K/uL   Lymphocytes Relative 15 %   Lymphs Abs 0.7 0.7 - 4.0 K/uL   Monocytes Relative 12 %   Monocytes Absolute 0.5 0 - 1 K/uL   Eosinophils Relative 3 %   Eosinophils Absolute 0.1 0 - 0 K/uL   Basophils Relative 1 %   Basophils Absolute 0.1 0 - 0 K/uL   Immature Granulocytes 0 %   Abs Immature Granulocytes 0.02 0.00 - 0.07 K/uL    Comment: Performed at [facility], [address], [place], [place] [address]  Basic metabolic panel     Status: Abnormal   Collection Time: 12/13/19  6:25 AM  Result Value Ref Range   Sodium 134 (L) 135 - 145 mmol/L   Potassium 3.6 3.5 - 5.1 mmol/L   Chloride 94 (L) 98 - 111 mmol/L   CO2 29 22 - 32 mmol/L   Glucose, Bld 103 (H) 70 - 99 mg/dL    Comment: Glucose reference range applies only to samples taken after fasting for at least 8 hours.   BUN 20 8 - 23 mg/dL   Creatinine, Ser  1.32 (H) 0.44 - 1.00 mg/dL   Calcium 8.7 (L) 8.9 - 10.3 mg/dL   GFR calc non Af Amer 35 (L) >60 mL/min   GFR calc Af Amer 41 (L) >60 mL/min   Anion gap 11 5 - 15    Comment: Performed at [facility], [address], [place], [place] [address]  Brain natriuretic peptide     Status: Abnormal   Collection Time: 12/13/19  6:25 AM  Result Value Ref Range   B Natriuretic Peptide 468.0 (H) 0.0 - 100.0 pg/mL    Comment: Performed at [facility], [address] [place], [place] [address]  Troponin I (High Sensitivity)     Status: Abnormal   Collection Time: 12/13/19  6:25 AM  Result Value Ref Range   Troponin I (High Sensitivity) 34 (H) <18 ng/L    Comment: (  NOTE) Elevated high sensitivity troponin I (hsTnI) values and significant  changes across serial measurements may suggest ACS but many other  chronic and acute conditions are known to elevate hsTnI results.  Refer to the "Links" section for chest pain algorithms and additional  guidance. Performed at [facility], [address], [place], [place] [address]   Troponin I (High Sensitivity)     Status: Abnormal   Collection Time: 12/13/19  8:07 AM  Result Value Ref Range   Troponin I (High Sensitivity) 35 (H) <18 ng/L    Comment: (NOTE) Elevated high sensitivity troponin I (hsTnI) values and significant  changes across serial measurements may suggest ACS but many other  chronic and acute conditions are known to elevate hsTnI results.  Refer to the "Links" section for chest pain algorithms and additional  guidance. Performed at [facility], [address] [place],  [address]   CBC with Differential/Platelet     Status: Abnormal   Collection Time: 12/15/19  8:19 AM  Result Value Ref Range   WBC 4.5 3.4 - 10.8 x10E3/uL   RBC 2.94 (L) 3.77 - 5.28 x10E6/uL   Hemoglobin 9.4 (L) 11.1 - 15.9 g/dL   Hematocrit 28.9 (L) 34.0 - 46.6 %   MCV 98 (H) 79 - 97 fL   MCH 32.0 26.6 - 33.0 pg   MCHC 32.5 31 - 35 g/dL   RDW 13.5 11.7 - 15.4 %    Platelets 201 150 - 450 x10E3/uL   Neutrophils 63 Not Estab. %   Lymphs 20 Not Estab. %   Monocytes 13 Not Estab. %   Eos 3 Not Estab. %   Basos 1 Not Estab. %   Neutrophils Absolute 2.8 1 - 7 x10E3/uL   Lymphocytes Absolute 0.9 0 - 3 x10E3/uL   Monocytes Absolute 0.6 0 - 0 x10E3/uL   EOS (ABSOLUTE) 0.1 0.0 - 0.4 x10E3/uL   Basophils Absolute 0.1 0 - 0 x10E3/uL   Immature Granulocytes 0 Not Estab. %   Immature Grans (Abs) 0.0 0.0 - 0.1 x10E3/uL  IBC + Ferritin     Status: Abnormal   Collection Time: 12/18/19  9:35 AM  Result Value Ref Range   Iron 8 (L) 42 - 145 ug/dL   Transferrin 251.0 212.0 - 360.0 mg/dL   Saturation Ratios 2.3 (L) 20.0 - 50.0 %   Ferritin 30.1 10.0 - 291.0 ng/mL  CBC with Differential/Platelet     Status: Abnormal   Collection Time: 12/18/19  9:35 AM  Result Value Ref Range   WBC 3.7 (L) 4.0 - 10.5 K/uL   RBC 2.87 (L) 3.87 - 5.11 Mil/uL   Hemoglobin 9.3 (L) 12.0 - 15.0 g/dL   HCT 27.8 (L) 36 - 46 %   MCV 96.8 78.0 - 100.0 fl   MCHC 33.3 30.0 - 36.0 g/dL   RDW 15.7 (H) 11.5 - 15.5 %   Platelets 193.0 150 - 400 K/uL   Neutrophils Relative % 60.7 43 - 77 %   Lymphocytes Relative 19.5 12 - 46 %   Monocytes Relative 15.1 (H) 3 - 12 %   Eosinophils Relative 2.6 0 - 5 %   Basophils Relative 2.1 0 - 3 %   Neutro Abs 2.2 1.4 - 7.7 K/uL   Lymphs Abs 0.7 0.7 - 4.0 K/uL   Monocytes Absolute 0.6 0 - 1 K/uL   Eosinophils Absolute 0.1 0 - 0 K/uL   Basophils Absolute 0.1 0 - 0 K/uL  Urinalysis, Complete  Status: Abnormal   Collection Time: 12/21/19  9:07 AM  Result Value Ref Range   Specific Gravity, UA 1.020 1.005 - 1.030   pH, UA 6.0 5.0 - 7.5   Color, UA Yellow Yellow   Appearance Ur Clear Clear   Leukocytes,UA 3+ (A) Negative   Protein,UA 1+ (A) Negative/Trace   Glucose, UA Negative Negative   Ketones, UA Negative Negative   RBC, UA 2+ (A) Negative   Bilirubin, UA Negative Negative   Urobilinogen, Ur 0.2 0.2 - 1.0 mg/dL   Nitrite, UA Negative  Negative   Microscopic Examination See below:   Microscopic Examination     Status: Abnormal   Collection Time: 12/21/19  9:07 AM   Urine  Result Value Ref Range   WBC, UA >30 (A) 0 - 5 /hpf   RBC 3-10 (A) 0 - 2 /hpf   Epithelial Cells (non renal) 0-10 0 - 10 /hpf   Renal Epithel, UA 0-10 (A) None seen /hpf   Bacteria, UA Many (A) None seen/Few  Urine Culture     Status: Abnormal   Collection Time: 12/21/19  9:08 AM   Specimen: Urine   UR  Result Value Ref Range   Urine Culture, Routine Final report (A)    Organism ID, Bacteria Klebsiella pneumoniae (A)     Comment: Greater than 100,000 colony forming units per mL Cefazolin <=4 ug/mL Cefazolin with an MIC <=16 predicts susceptibility to the oral agents cefaclor, cefdinir, cefpodoxime, cefprozil, cefuroxime, cephalexin, and loracarbef when used for therapy of uncomplicated urinary tract infections due to E. coli, Klebsiella pneumoniae, and Proteus mirabilis.    Antimicrobial Susceptibility Comment     Comment:       ** S = Susceptible; I = Intermediate; R = Resistant **                    P = Positive; N = Negative             MICS are expressed in micrograms per mL    Antibiotic                 RSLT#1    RSLT#2    RSLT#3    RSLT#4 Amoxicillin/Clavulanic Acid    S Ampicillin                     R Cefepime                       S Ceftriaxone                    S Cefuroxime                     S Ciprofloxacin                  S Ertapenem                      S Gentamicin                     S Imipenem                       S Levofloxacin                   S Meropenem  S Nitrofurantoin                 S Piperacillin/Tazobactam        S Tetracycline                   S Tobramycin                     S Trimethoprim/Sulfa             S     RADIOGRAPHIC STUDIES: I have personally reviewed the radiological images as listed and agreed with the findings in the report. EEG  Result Date: 12/21/2019        [facility] [place] [place] [address] [phone]      Electroencephalogram Procedure Note Ms. Jaxie Racanelli Givler Date of Birth:  [birth year] Medical Record Number:  [id number] Indications: Diagnostic Date of Procedure : 12/16/2019 Medications: none Clinical history : 86 year old patient being evaluated for memory loss Technical Description This study was performed using 17 channel digital electroencephalographic recording equipment. International 10-20 electrode placement was used. The record was obtained with the patient awake, drowsy and asleep.  The record is of fair technical quality for purposes of interpretation. Activation Procedures:  photic stimulation . EEG Description Awake: Alpha Activity: The waking state record contains a well-defined bi-occipital alpha rhythm of  low amplitude with a dominant frequency of 9 Hz. Reactivity is uncertain. No paroxsymal activity, spikes, or sharp waves are noted.  Intermittent 6 to 7 Hz generalized theta range slowing is noted in a diffuse and symmetric distribution. Technical component of study is adequate. EKG tracing shows regular sinus rhythm Length of this recording is 25 minutes and 12 seconds Sleep: With drowsiness, there is attenuation of the background alpha activity. As the patient enters into light sleep, vertex waves and symmetrical spindles are noted. K complexes are noted in sleep. Transition to the waking state is unremarkable. Result of Activation Procedures: Hyperventilation: N/A. Photo Stimulation: No photic driving response is noted. Summary Normal electroencephalogram, awake, asleep and with activation procedures. There are no focal lateralizing or epileptiform features.   MR BRAIN W WO CONTRAST  Result Date: 12/03/2019 CLINICAL DATA:  Dimension, vascular suspected. EXAM: MRI HEAD WITHOUT AND WITH CONTRAST TECHNIQUE: Multiplanar, multiecho pulse sequences of the brain and surrounding structures were obtained without and  with intravenous contrast. CONTRAST:  20m GADAVIST GADOBUTROL 1 MMOL/ML IV SOLN COMPARISON:  MRI of the brain August 26, 2019 FINDINGS: Brain: No acute infarction, hemorrhage, hydrocephalus, extra-axial collection or mass lesion. 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 Moderate parenchymal volume loss. Prominent confluent T2 hyperintensity within the white matter of the cerebral hemispheres, nonspecific, most likely related to chronic small vessel ischemia. No focus of abnormal contrast enhancement seen. The area of increased T2 signal within the deep white matter of the right parieto-occipital region with associated remote infarcts are new from prior MRI Vascular: Normal flow voids. Skull and upper cervical spine: Normal marrow signal. Sinuses/Orbits: Bilateral lens surgery. Mild mucosal thickening of the bilateral ethmoid cells. Other: None. IMPRESSION: 1. No acute intracranial abnormality. 2. 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 Electronically Signed   By: KPedro EarlsM.D.   On: 12/03/2019 12:17   DG Chest Portable 1 View  Result Date:  12/13/2019 CLINICAL DATA:  Shortness of breath 2 days. EXAM: PORTABLE CHEST 1 VIEW COMPARISON:  10/19/2019 FINDINGS: Patient is rotated to the left. Left-sided pacemaker unchanged. Lungs are adequately inflated with mild prominence of the central pulmonary vessels likely mild degree of vascular congestion. No focal lobar consolidation or effusion. Mild stable cardiomegaly. Evidence of previous aortic valve replacement. Remainder the exam is unchanged. IMPRESSION: Mild stable cardiomegaly with suggestion of mild vascular congestion. Electronically Signed   By: Marin Olp M.D.   On: 12/13/2019 08:20   I have  independently interviewed and examined this patient.  I agree with HPI written by my nurse practitioner Wenda Low, FNP.  I have independently formulated my assessment and plan.  ASSESSMENT & PLAN:  1.  Normocytic to macrocytic anemia: -CBC on 12/18/2019 shows hemoglobin 9.3 with MCV of 96.8. -Ferritin was 30 and percent saturation was 2.3. -She also has history of CKD.  She had received Feraheme in the past, in 2019. -EGD on 10/12/2019 showed small angiectasia with no bleeding found on the greater curvature of the stomach, status post APC.  Esophagus and duodenum was normal. -She is on Eliquis 2.5 mg twice daily for atrial fibrillation. -Combination anemia from iron deficiency likely from blood loss and CKD. -We will check for other etiologies including R42, folic acid, methylmalonic acid and copper levels.  We will check for hemolysis with LDH, Coombs test and haptoglobin.  We will also check SPEP. -I have recommended Feraheme weekly x2.  We discussed the side effects in detail. -I plan to see her back in 6 to 8 weeks with repeat blood work.   All questions were answered. The patient knows to call the clinic with any problems, questions or concerns.      Derek Jack, MD 12/29/19 2:52 PM

## 2019-12-29 NOTE — Patient Instructions (Signed)
[facility] at [facility] Discharge Instructions     Thank you for choosing [facility] at [facility] to provide your oncology and hematology care.  To afford each patient quality time with our provider, please arrive at least 15 minutes before your scheduled appointment time.   If you have a lab appointment with the Cancer Center please come in thru the Main Entrance and check in at the main information desk.  You need to re-schedule your appointment should you arrive 10 or more minutes late.  We strive to give you quality time with our providers, and arriving late affects you and other patients whose appointments are after yours.  Also, if you no show three or more times for appointments you may be dismissed from the clinic at the providers discretion.     Again, thank you for choosing [facility].  Our hope is that these requests will decrease the amount of time that you wait before being seen by our physicians.       _____________________________________________________________  Should you have questions after your visit to [facility], please contact our office at [phone] between the hours of 8:00 a.m. and 4:30 p.m.  Voicemails left after 4:00 p.m. will not be returned until the following business day.  For prescription refill requests, have your pharmacy contact our office and allow 72 hours.    Due to Covid, you will need to wear a mask upon entering the hospital. If you do not have a mask, a mask will be given to you at the Main Entrance upon arrival. For doctor visits, patients may have 1 support person with them. For treatment visits, patients can not have anyone with them due to social distancing guidelines and our immunocompromised population.      

## 2019-12-30 LAB — PROTEIN ELECTROPHORESIS, SERUM
A/G Ratio: 1.4 (ref 0.7–1.7)
Albumin ELP: 3.6 g/dL (ref 2.9–4.4)
Alpha-1-Globulin: 0.1 g/dL (ref 0.0–0.4)
Alpha-2-Globulin: 0.7 g/dL (ref 0.4–1.0)
Beta Globulin: 1 g/dL (ref 0.7–1.3)
Gamma Globulin: 0.7 g/dL (ref 0.4–1.8)
Globulin, Total: 2.6 g/dL (ref 2.2–3.9)
Total Protein ELP: 6.2 g/dL (ref 6.0–8.5)

## 2019-12-30 LAB — HAPTOGLOBIN: Haptoglobin: 127 mg/dL (ref 41–333)

## 2019-12-31 ENCOUNTER — Other Ambulatory Visit: Payer: Medicare Other

## 2019-12-31 ENCOUNTER — Other Ambulatory Visit: Payer: Self-pay

## 2019-12-31 DIAGNOSIS — I10 Essential (primary) hypertension: Secondary | ICD-10-CM | POA: Diagnosis not present

## 2019-12-31 DIAGNOSIS — R7989 Other specified abnormal findings of blood chemistry: Secondary | ICD-10-CM

## 2019-12-31 LAB — COPPER, SERUM: Copper: 132 ug/dL (ref 80–158)

## 2020-01-01 ENCOUNTER — Encounter (HOSPITAL_COMMUNITY): Payer: Self-pay

## 2020-01-01 ENCOUNTER — Inpatient Hospital Stay (HOSPITAL_COMMUNITY): Payer: Medicare Other

## 2020-01-01 ENCOUNTER — Other Ambulatory Visit: Payer: Self-pay | Admitting: Family Medicine

## 2020-01-01 ENCOUNTER — Other Ambulatory Visit: Payer: Self-pay | Admitting: Neurology

## 2020-01-01 VITALS — BP 94/59 | HR 63 | Temp 97.5°F | Resp 16

## 2020-01-01 DIAGNOSIS — D500 Iron deficiency anemia secondary to blood loss (chronic): Secondary | ICD-10-CM

## 2020-01-01 DIAGNOSIS — I998 Other disorder of circulatory system: Secondary | ICD-10-CM | POA: Diagnosis not present

## 2020-01-01 DIAGNOSIS — D509 Iron deficiency anemia, unspecified: Secondary | ICD-10-CM | POA: Diagnosis not present

## 2020-01-01 DIAGNOSIS — I350 Nonrheumatic aortic (valve) stenosis: Secondary | ICD-10-CM | POA: Diagnosis not present

## 2020-01-01 DIAGNOSIS — N189 Chronic kidney disease, unspecified: Secondary | ICD-10-CM | POA: Diagnosis not present

## 2020-01-01 DIAGNOSIS — I130 Hypertensive heart and chronic kidney disease with heart failure and stage 1 through stage 4 chronic kidney disease, or unspecified chronic kidney disease: Secondary | ICD-10-CM | POA: Diagnosis not present

## 2020-01-01 DIAGNOSIS — I4891 Unspecified atrial fibrillation: Secondary | ICD-10-CM | POA: Diagnosis not present

## 2020-01-01 LAB — TSH+FREE T4
Free T4: 1.49 ng/dL (ref 0.82–1.77)
TSH: 3.88 u[IU]/mL (ref 0.450–4.500)

## 2020-01-01 LAB — METHYLMALONIC ACID, SERUM: Methylmalonic Acid, Quantitative: 642 nmol/L — ABNORMAL HIGH (ref 0–378)

## 2020-01-01 MED ORDER — FAMOTIDINE 20 MG PO TABS
20.0000 mg | ORAL_TABLET | Freq: Once | ORAL | Status: AC
  Administered 2020-01-01: 20 mg via ORAL
  Filled 2020-01-01: qty 1

## 2020-01-01 MED ORDER — LORATADINE 10 MG PO TABS
10.0000 mg | ORAL_TABLET | Freq: Once | ORAL | Status: AC
  Administered 2020-01-01: 10 mg via ORAL
  Filled 2020-01-01: qty 1

## 2020-01-01 MED ORDER — SODIUM CHLORIDE 0.9 % IV SOLN: Freq: Once | INTRAVENOUS | Status: AC

## 2020-01-01 MED ORDER — SODIUM CHLORIDE 0.9 % IV SOLN
510.0000 mg | Freq: Once | INTRAVENOUS | Status: AC
  Administered 2020-01-01: 510 mg via INTRAVENOUS
  Filled 2020-01-01: qty 510

## 2020-01-01 MED ORDER — ACETAMINOPHEN 325 MG PO TABS
650.0000 mg | ORAL_TABLET | Freq: Once | ORAL | Status: AC
  Administered 2020-01-01: 650 mg via ORAL
  Filled 2020-01-01: qty 2

## 2020-01-01 NOTE — Progress Notes (Signed)
01/01/20  Adding premedications for Feraheme:  Claritin 10 mg po x 1 Famotidine 20 mg po x 1 Acetaminophen 650 mg po x 1  Henreitta Leber, PharmD

## 2020-01-01 NOTE — Progress Notes (Signed)
Patient tolerated iron infusion with no complaints voiced.  Peripheral IV site clean and dry with good blood return noted before and after infusion.  Band aid applied.  VSS with discharge and left by wheelchair with no s/s of distress noted.  

## 2020-01-01 NOTE — Progress Notes (Signed)
Hello Paz,  Your lab result is normal and/or stable.Some minor variations that are not significant are commonly marked abnormal, but do not represent any medical problem for you.  Best regards, Farheen Pfahler, M.D.

## 2020-01-04 ENCOUNTER — Encounter: Payer: Self-pay | Admitting: Neurology

## 2020-01-04 ENCOUNTER — Other Ambulatory Visit: Payer: Self-pay

## 2020-01-04 ENCOUNTER — Ambulatory Visit (INDEPENDENT_AMBULATORY_CARE_PROVIDER_SITE_OTHER): Payer: Medicare Other | Admitting: Neurology

## 2020-01-04 VITALS — BP 119/65 | HR 71 | Wt 114.6 lb

## 2020-01-04 DIAGNOSIS — I63511 Cerebral infarction due to unspecified occlusion or stenosis of right middle cerebral artery: Secondary | ICD-10-CM | POA: Diagnosis not present

## 2020-01-04 DIAGNOSIS — F0150 Vascular dementia without behavioral disturbance: Secondary | ICD-10-CM

## 2020-01-04 DIAGNOSIS — G309 Alzheimer's disease, unspecified: Secondary | ICD-10-CM | POA: Diagnosis not present

## 2020-01-04 DIAGNOSIS — F0280 Dementia in other diseases classified elsewhere without behavioral disturbance: Secondary | ICD-10-CM

## 2020-01-04 MED ORDER — MEMANTINE HCL 10 MG PO TABS: 10.0000 mg | ORAL_TABLET | Freq: Two times a day (BID) | ORAL | 1 refills | Status: DC

## 2020-01-04 NOTE — Patient Instructions (Signed)
I had a long discussion with the patient and her daughter regarding her mixed vascular Alzheimer's dementia and reviewed results of recent MRI scan and EEG and answered questions.  Continue Namenda and the current dose of 10 mg twice daily as she has obtained some improvement on it.  I encouraged her to continue to use a cane at all times and we discussed fall safety precautions.  Patient will need 24-hour caregiver support at home which she has.  Continue Eliquis for stroke prevention given history of atrial fibrillation and strokes.  She will return for follow-up in the future in 6 months with my nurse practitioner Janett Billow or call earlier if necessary.  Dementia Caregiver Guide Dementia is a term used to describe a number of symptoms that affect memory and thinking. The most common symptoms include:  Memory loss.  Trouble with language and communication.  Trouble concentrating.  Poor judgment.  Problems with reasoning.  Child-like behavior and language.  Extreme anxiety.  Angry outbursts.  Wandering from home or public places. Dementia usually gets worse slowly over time. In the early stages, people with dementia can stay independent and safe with some help. In later stages, they need help with daily tasks such as dressing, grooming, and using the bathroom. How to help the person with dementia cope Dementia can be frightening and confusing. Here are some tips to help the person with dementia cope with changes caused by the disease. General tips  Keep the person on track with his or her routine.  Try to identify areas where the person may need help.  Be supportive, patient, calm, and encouraging.  Gently remind the person that adjusting to changes takes time.  Help with the tasks that the person has asked for help with.  Keep the person involved in daily tasks and decisions as much as possible.  Encourage conversation, but try not to get frustrated or harried if the person  struggles to find words or does not seem to appreciate your help. Communication tips  When the person is talking or seems frustrated, make eye contact and hold the person's hand.  Ask specific questions that need yes or no answers.  Use simple words, short sentences, and a calm voice. Only give one direction at a time.  When offering choices, limit them to just 1 or 2.  Avoid correcting the person in a negative way.  If the person is struggling to find the right words, gently try to help him or her. How to recognize symptoms of stress Symptoms of stress in caregivers include:  Feeling frustrated or angry with the person with dementia.  Denying that the person has dementia or that his or her symptoms will not improve.  Feeling hopeless and unappreciated.  Difficulty sleeping.  Difficulty concentrating.  Feeling anxious, irritable, or depressed.  Developing stress-related health problems.  Feeling like you have too little time for your own life. Follow these instructions at home:   Make sure that you and the person you are caring for: ? Get regular sleep. ? Exercise regularly. ? Eat regular, nutritious meals. ? Drink enough fluid to keep your urine clear or pale yellow. ? Take over-the-counter and prescription medicines only as told by your health care providers. ? Attend all scheduled health care appointments.  Join a support group with others who are caregivers.  Ask about respite care resources so that you can have a regular break from the stress of caregiving.  Look for signs of stress in yourself and  in the person you are caring for. If you notice signs of stress, take steps to manage it.  Consider any safety risks and take steps to avoid them.  Organize medications in a pill box for each day of the week.  Create a plan to handle any legal or financial matters. Get legal or financial advice if needed.  Keep a calendar in a central location to remind the  person of appointments or other activities. Tips for reducing the risk of injury  Keep floors clear of clutter. Remove rugs, magazine racks, and floor lamps.  Keep hallways well lit, especially at night.  Put a handrail and nonslip mat in the bathtub or shower.  Put childproof locks on cabinets that contain dangerous items, such as medicines, alcohol, guns, toxic cleaning items, sharp tools or utensils, matches, and lighters.  Put the locks in places where the person cannot see or reach them easily. This will help ensure that the person does not wander out of the house and get lost.  Be prepared for emergencies. Keep a list of emergency phone numbers and addresses in a convenient area.  Remove car keys and lock garage doors so that the person does not try to get in the car and drive.  Have the person wear a bracelet that tracks locations and identifies the person as having memory problems. This should be worn at all times for safety. Where to find support: Many individuals and organizations offer support. These include:  Support groups for people with dementia and for caregivers.  Counselors or therapists.  Home health care services.  Adult day care centers. Where to find more information Alzheimer's Association: [employer] Contact a health care provider if:  The person's health is rapidly getting worse.  You are no longer able to care for the person.  Caring for the person is affecting your physical and emotional health.  The person threatens himself or herself, you, or anyone else. Summary  Dementia is a term used to describe a number of symptoms that affect memory and thinking.  Dementia usually gets worse slowly over time.  Take steps to reduce the person's risk of injury, and to plan for future care.  Caregivers need support, relief from caregiving, and time for their own lives. This information is not intended to replace advice given to you by your health care  provider. Make sure you discuss any questions you have with your health care provider. Document Revised: 05/24/2017 Document Reviewed: 05/15/2016 Elsevier Patient Education  2020 [employer].

## 2020-01-04 NOTE — Progress Notes (Signed)
[facility] [address] [place]. [place] [address] [phone]       OFFICE FOLLOW-UP NOTE  [name] Date of Birth:  December 15, 1926 Medical Record Number:  [id number]   HPI:  Referring physician Dr  Reesa Chew  Reason for referral : dementia and stroke  Initial visit 11/11/2019 :[name] is a 86 year old Caucasian lady seen today for initial office consultation visit following significant new cognitive changes following her recent stroke.  She is accompanied by her daughter.  History is obtained from them, review of electronic medical records and I personally reviewed available imaging films in PACS.  She has a past medical history of SVT s/p ablation, mitral valve prolapse, hypertension, atrial fibrillation on chronic anticoagulation who had developed GI hemorrhage and hence anticoagulation was stopped.  She will was found on the floor unresponsive.  She was considered outside the code stroke window.  CT scan showed no acute abnormality but MRI scan showed right thalamocapsular junction infarct with punctate infarcts also involving the right insula, right parietal and occipital lobes with multiple old infarcts involving the basal ganglia, right thalamus, cerebellum bilaterally.  Carotid ultrasound showed no significant bilateral extracranial stenosis.  2D echo done in January 2021 showed normal ejection fraction.  Patient also had previous changes of transaortic valve repair.  LDL cholesterol was 83 mg percent and hemoglobin A1c was 5.3.  Patient had been off anticoagulation due to multiple embolic strokes after careful discussion of risk benefits of anticoagulation and bleeding and discussion with patient and daughter she will was recommended to go back on Eliquis.  The daughter however informs me that the patient has not been taking Eliquis as they were waiting on seeing the primary care physician on the have not yet seen.  However the daughter's main complaint today is that  patient had significant cognitive worsening.  She is has increasing confusion and agitation with caregivers as well as family members.  She often does not follow directions and when caregivers are not nearby she on one occasion went to the mailbox and on another occasion got a screwdriver to try to open the door and it was locked.  She often refuses bathing and self-care.  On Mother's Day she had to have children and grandchildren and later on asked where they were going in did not remember them having, and wished her.  She feels straight up her coffee and eats the food called.  She is seeing people in the home who were not there and some of them are even deceased.  At one visit she wore a double panties and double depends and double pads.  The daughter feels that she is clearly had major cognitive decline since the recent strokes.  She has not been evaluated for reversible causes of cognitive impairment and has not been tried on Aricept or Namenda.  The daughter is not sure whether she had some cognitive impairment even before her recent strokes.  Patient lives by herself but the daughter lives right next door and helps her out with most activities.  There is no prior history of seizures.  There is no family history of dementia. Update 01/04/2020: She returns for follow-up after last visit 2 months ago.  She is accompanied by daughter.  She is tolerating Namenda well without any significant dizziness sleepiness or other side effects and in fact has noticed some cognitive improvement.  She is able to carry out conversations better and remember recent information.  She has 24/7 caregiver support  at home.  She is able to ambulate with a cane and is fairly careful and has had no falls or injuries.  She remains on Eliquis which is tolerating well without bleeding or other side effects.  She does have some occasional delusions and hallucinations but there she can easily be redirected.  She is sleeping all right.  She has  had no unsafe behavior.  She does do still a lot of word searches.  She has no new complaints.  She did undergo MRI scan of the brain on 12/03/2019 which showed no acute abnormality but showed old bilateral cerebellar and right thalamic and corona radiata infarcts.  EEG on 12/21/2019 was normal.  TSH and free T4 on 12/31/2019 were normal. ROS:   14 system review of systems is positive for memory loss, confusion, hallucinations, disorientation, gait difficulty getting lost and all other systems negative  PMH:  Past Medical History:  Diagnosis Date  . AKI (acute kidney injury) ([facility])   . Anemia    years ago  . Aortic stenosis   . Arthritis   . Asthma   . Atrial fibrillation ([place])   . CHF (congestive heart failure) ([facility]) 11/2014  . CKD (chronic kidney disease)   . Dementia ([place])   . Family history of adverse reaction to anesthesia    2 daughters would have N/V  . Gastric AVM   . GI bleed   . Glaucoma   . Hearing loss   . Heart murmur   . HTN (hypertension)   . Hypokalemia   . Pneumonia   . Prolapsing mitral leaflet syndrome   . Shortness of breath dyspnea    with exertion  . Stroke ([place])   . SVT (supraventricular tachycardia) ([facility])    S/P ablation of AVNRT in 2003    Social History:  Social History   Socioeconomic History  . Marital status: Widowed    Spouse name: Not on file  . Number of children: 3  . Years of education: Not on file  . Highest education level: 12th grade  Occupational History  . Occupation: Retired  Tobacco Use  . Smoking status: Never Smoker  . Smokeless tobacco: Never Used  Vaping Use  . Vaping Use: Never used  Substance and Sexual Activity  . Alcohol use: No    Alcohol/week: 0.0 standard drinks  . Drug use: No  . Sexual activity: Not Currently  Other Topics Concern  . Not on file  Social History Narrative  . Not on file   Social Determinants of Health   Financial Resource Strain: Low Risk   . Difficulty of Paying Living Expenses: Not hard  at all  Food Insecurity: No Food Insecurity  . Worried About Charity fundraiser in the Last Year: Never true  . Ran Out of Food in the Last Year: Never true  Transportation Needs: No Transportation Needs  . Lack of Transportation (Medical): No  . Lack of Transportation (Non-Medical): No  Physical Activity: Inactive  . Days of Exercise per Week: 0 days  . Minutes of Exercise per Session: 0 min  Stress: No Stress Concern Present  . Feeling of Stress : Not at all  Social Connections: Moderately Integrated  . Frequency of Communication with Friends and Family: More than three times a week  . Frequency of Social Gatherings with Friends and Family: More than three times a week  . Attends Religious Services: More than 4 times per year  . Active Member of Clubs or Organizations:  Yes  . Attends Club or Organization Meetings: More than 4 times per year  . Marital Status: Widowed  Intimate Partner Violence: Not At Risk  . Fear of Current or Ex-Partner: No  . Emotionally Abused: No  . Physically Abused: No  . Sexually Abused: No    Medications:   Current Outpatient Medications on File Prior to Visit  Medication Sig Dispense Refill  . acetaminophen (TYLENOL) 500 MG tablet Take 500 mg by mouth every 6 (six) hours as needed for mild pain or headache.     . albuterol (PROAIR HFA) 108 (90 Base) MCG/ACT inhaler Inhale 2 puffs into the lungs every 4 (four) hours as needed for wheezing or shortness of breath. 18 g 11  . apixaban (ELIQUIS) 5 MG TABS tablet Take 2.5 mg by mouth 2 (two) times daily. 1/2 tab in the morning and 1/2 tab in the evening    . Biotin 10 MG CAPS Take 10 mg by mouth daily.     . Budeson-Glycopyrrol-Formoterol (BREZTRI AEROSPHERE) 160-9-4.8 MCG/ACT AERO Inhale 2 Inhalers into the lungs in the morning and at bedtime. 5.9 g 1  . Calcium Carb-Cholecalciferol (CALTRATE 600+D3) 600-800 MG-UNIT TABS Take 1 tablet by mouth daily.    . famotidine (PEPCID) 20 MG tablet Take 1 tablet (20  mg total) by mouth at bedtime. 90 tablet 1  . ferrous sulfate 325 (65 FE) MG tablet Take 1 tablet (325 mg total) by mouth 2 (two) times daily with a meal.  3  . furosemide (LASIX) 20 MG tablet Take 3 tablets (60 mg total) by mouth daily. 270 tablet 3  . levothyroxine (SYNTHROID) 25 MCG tablet Take 1 tablet (25 mcg total) by mouth daily. 30 tablet 2  . metoprolol tartrate (LOPRESSOR) 25 MG tablet Take 1 tablet (25 mg total) by mouth 2 (two) times daily. 180 tablet 3  . multivitamin-iron-minerals-folic acid (CENTRUM) chewable tablet Chew 1 tablet by mouth daily.    [name] OVER THE COUNTER MEDICATION Take 0.5 tablets by mouth daily as needed (for allergy). Patient not sure about the name.    . pantoprazole (PROTONIX) 40 MG tablet TAKE 1 TABLET BY MOUTH TWICE DAILY BEFORE MEAL(S) 60 tablet 0  . potassium chloride SA (KLOR-CON) 20 MEQ tablet Take 1 tablet (20 mEq total) by mouth daily. 90 tablet 3  . sulfamethoxazole-trimethoprim (BACTRIM DS) 800-160 MG tablet Take 1 tablet by mouth 2 (two) times daily. 20 tablet 0   No current facility-administered medications on file prior to visit.    Allergies:   Allergies  Allergen Reactions  . Hctz [Hydrochlorothiazide] Other (See Comments)    Pt was ill and this affected her kidneys   . Aspirin Other (See Comments)    Cardiologist said the patient is to not take this  . Codeine Other (See Comments)    Made the patient feel ill, has not had any problems since 1977    Physical Exam General: well developed, well nourished elderly Caucasian lady, seated, in no evident distress.  She is extremely hard of hearing. Head: head normocephalic and atraumatic.  Neck: supple with no carotid or supraclavicular bruits Cardiovascular: regular rate and rhythm, no murmurs Musculoskeletal: no deformity Skin:  no rash/petichiae Vascular:  Normal pulses all extremities Vitals:   01/04/20 1552  BP: 119/65  Pulse: 71   Neurologic Exam Mental Status: Awake and fully  alert. Oriented to place and time. Recent and remote memory poor attention span, concentration and fund of knowledge diminished mood and affect appropriate.  Recall 2/3.  Able to name  10 animals which can walk on 4 legs.  Unable to copy intersecting pentagons.  Clock drawing 3/4.  Patient unable to complete formal Mini-Mental status exam. Cranial Nerves: Fundoscopic exam not done pupils equal, briskly reactive to light. Extraocular movements full without nystagmus. Visual fields full to confrontation. Hearing significantly diminished bilaterally despite hearing aids.  Facial sensation intact. Face, tongue, palate moves normally and symmetrically.  Motor: Normal bulk and tone. Normal strength in all tested extremity muscles.  Diminished fine finger movements on the left.  Orbits right over left upper extremity.  Trace weakness of left grip. Sensory.: intact to touch ,pinprick .position and vibratory sensation.  Coordination: Rapid alternating movements normal in all extremities. Finger-to-nose and heel-to-shin performed accurately bilaterally. Gait and Station: Arises from chair without difficulty. Stance is stooped gait demonstrates normal stride length and balance .  Uses a 4 pronged cane to walk.  Tandem walking not tested. Reflexes: 1+ and symmetric. Toes downgoing.   NIHSS  1 Modified Rankin  3  ASSESSMENT: 86 year old Caucasian lady with recent right middle cerebral artery infarcts secondary to embolization from atrial fibrillation in March 2021 followed by significant cognitive worsening likely due to mixed dementia of vascular and Alzheimer's type.  She has shown some response to Namenda     PLAN: I had a long discussion with the patient and her daughter regarding her mixed vascular Alzheimer's dementia and reviewed results of recent MRI scan and EEG and answered questions.  Continue Namenda and the current dose of 10 mg twice daily as she has obtained some improvement on it.  I encouraged  her to continue to use a cane at all times and we discussed fall safety precautions.  Patient will need 24-hour caregiver support at home which she has.  Continue Eliquis for stroke prevention given history of atrial fibrillation and strokes.  She will return for follow-up in the future in 6 months with my nurse practitioner Janett Billow or call earlier if necessary. Greater than 50% of time during this 30 minute   visit was spent on counseling,explanation of diagnosis dementia and stroke, planning of further management, discussion with patient and family and coordination of care Antony Contras, MD  [facility] [address] [place], [place] [address]  Phone [phone] Fax [phone] Note: This document was prepared with digital dictation and possible smart phrase technology. Any transcriptional errors that result from this process are unintentional

## 2020-01-08 ENCOUNTER — Encounter (HOSPITAL_COMMUNITY): Payer: Self-pay

## 2020-01-08 ENCOUNTER — Other Ambulatory Visit: Payer: Self-pay

## 2020-01-08 ENCOUNTER — Inpatient Hospital Stay (HOSPITAL_COMMUNITY): Payer: Medicare Other

## 2020-01-08 VITALS — BP 129/60 | HR 60 | Temp 97.1°F | Resp 18

## 2020-01-08 DIAGNOSIS — I998 Other disorder of circulatory system: Secondary | ICD-10-CM | POA: Diagnosis not present

## 2020-01-08 DIAGNOSIS — I4891 Unspecified atrial fibrillation: Secondary | ICD-10-CM | POA: Diagnosis not present

## 2020-01-08 DIAGNOSIS — D500 Iron deficiency anemia secondary to blood loss (chronic): Secondary | ICD-10-CM

## 2020-01-08 DIAGNOSIS — N189 Chronic kidney disease, unspecified: Secondary | ICD-10-CM | POA: Diagnosis not present

## 2020-01-08 DIAGNOSIS — D509 Iron deficiency anemia, unspecified: Secondary | ICD-10-CM | POA: Diagnosis not present

## 2020-01-08 DIAGNOSIS — I350 Nonrheumatic aortic (valve) stenosis: Secondary | ICD-10-CM | POA: Diagnosis not present

## 2020-01-08 DIAGNOSIS — I130 Hypertensive heart and chronic kidney disease with heart failure and stage 1 through stage 4 chronic kidney disease, or unspecified chronic kidney disease: Secondary | ICD-10-CM | POA: Diagnosis not present

## 2020-01-08 MED ORDER — LORATADINE 10 MG PO TABS
10.0000 mg | ORAL_TABLET | Freq: Once | ORAL | Status: AC
  Administered 2020-01-08: 10 mg via ORAL
  Filled 2020-01-08: qty 1

## 2020-01-08 MED ORDER — ACETAMINOPHEN 325 MG PO TABS
650.0000 mg | ORAL_TABLET | Freq: Once | ORAL | Status: AC
  Administered 2020-01-08: 650 mg via ORAL
  Filled 2020-01-08: qty 2

## 2020-01-08 MED ORDER — SODIUM CHLORIDE 0.9 % IV SOLN: Freq: Once | INTRAVENOUS | Status: AC

## 2020-01-08 MED ORDER — SODIUM CHLORIDE 0.9 % IV SOLN
510.0000 mg | Freq: Once | INTRAVENOUS | Status: AC
  Administered 2020-01-08: 510 mg via INTRAVENOUS
  Filled 2020-01-08: qty 510

## 2020-01-08 MED ORDER — FAMOTIDINE 20 MG PO TABS
20.0000 mg | ORAL_TABLET | Freq: Once | ORAL | Status: AC
  Administered 2020-01-08: 20 mg via ORAL
  Filled 2020-01-08: qty 1

## 2020-01-08 MED ORDER — SODIUM CHLORIDE 0.9% FLUSH: 10.0000 mL | Freq: Once | INTRAVENOUS | Status: DC | PRN

## 2020-01-08 NOTE — Progress Notes (Signed)
Patient received iron infusion per order. Patient tolerated well with no complaints. VSS. Patient departed clinic via wheelchair in satisfactory condition.

## 2020-01-12 ENCOUNTER — Ambulatory Visit: Payer: Medicare Other | Admitting: Family Medicine

## 2020-01-12 ENCOUNTER — Encounter: Payer: Self-pay | Admitting: Family Medicine

## 2020-01-12 ENCOUNTER — Other Ambulatory Visit: Payer: Self-pay

## 2020-01-12 ENCOUNTER — Ambulatory Visit (INDEPENDENT_AMBULATORY_CARE_PROVIDER_SITE_OTHER): Payer: Medicare Other | Admitting: Family Medicine

## 2020-01-12 VITALS — BP 124/67 | HR 65 | Temp 98.0°F | Resp 20 | Ht 59.5 in | Wt 115.4 lb

## 2020-01-12 DIAGNOSIS — D500 Iron deficiency anemia secondary to blood loss (chronic): Secondary | ICD-10-CM | POA: Diagnosis not present

## 2020-01-12 DIAGNOSIS — I480 Paroxysmal atrial fibrillation: Secondary | ICD-10-CM

## 2020-01-12 DIAGNOSIS — I63511 Cerebral infarction due to unspecified occlusion or stenosis of right middle cerebral artery: Secondary | ICD-10-CM | POA: Diagnosis not present

## 2020-01-12 DIAGNOSIS — I10 Essential (primary) hypertension: Secondary | ICD-10-CM

## 2020-01-12 MED ORDER — APIXABAN 2.5 MG PO TABS: 2.5000 mg | ORAL_TABLET | Freq: Two times a day (BID) | ORAL | 5 refills | Status: DC

## 2020-01-12 NOTE — Progress Notes (Signed)
Subjective:  Patient ID: Crystal Cooper, female    DOB: 1927-06-14  Age: 86 y.o. MRN: [id number]  CC: No chief complaint on file.   HPI Crystal Cooper presents for follow-up on her anemia and chronic kidney disease.  She is also being followed for dementia by neurology and has been placed on memantine.  She recently saw Dr. Leonie Cooper who placed her on the memantine.  He is following her for that.  Her daughter says that she still has some symptoms of poor cognition and lack of memory.  Patient is also being followed for iron deficiency anemia likely due to blood loss.  She continues to take Eliquis.  Dr. Delton Cooper gave her iron infusions through his clinic the last 2 Fridays.  For a while she seemed more energetic but now she is back to normal states her daughter who gives the history today.  She is in today also for recheck of her CBC.  Depression screen Crystal Cooper 2/9 01/12/2020 12/01/2019 11/27/2019  Decreased Interest 0 0 0  Down, Depressed, Hopeless 0 0 0  PHQ - 2 Score 0 0 0  Altered sleeping - - -  Tired, decreased energy - - -  Change in appetite - - -  Feeling bad or failure about yourself  - - -  Trouble concentrating - - -  Moving slowly or fidgety/restless - - -  Suicidal thoughts - - -  PHQ-9 Score - - -  Difficult doing work/chores - - -  Some recent data might be hidden    History Crystal Cooper has a past medical history of AKI (acute kidney injury) ([place]), Anemia, Aortic stenosis, Arthritis, Asthma, Atrial fibrillation ([place]), CHF (congestive heart failure) ([place]) (11/2014), CKD (chronic kidney disease), Dementia ([place]), Family history of adverse reaction to anesthesia, Gastric AVM, GI bleed, Glaucoma, Hearing loss, Heart murmur, HTN (hypertension), Hypokalemia, Pneumonia, Prolapsing mitral leaflet syndrome, Shortness of breath dyspnea, Stroke ([place]), and SVT (supraventricular tachycardia) ([place]).   She has a past surgical history that includes Appendectomy; Bladder surgery; Cardiac surgery;  Nasal sinus surgery; Cardiac catheterization (N/A, 09/26/2015); Eye surgery (Bilateral); Cardiac catheterization (N/A, 10/26/2015); Transcatheter aortic valve replacement, transfemoral (Right, 10/25/2015); TEE without cardioversion (N/A, 10/25/2015); Cardioversion (N/A, 03/02/2016); Esophagogastroduodenoscopy (egd) with propofol (N/A, 04/07/2018); Hot hemostasis (N/A, 04/07/2018); biopsy (04/07/2018); Esophagogastroduodenoscopy (egd) with propofol (N/A, 10/12/2019); Hot hemostasis (10/12/2019); and Pacemaker insertion.   Her family history includes AAA (abdominal aortic aneurysm) in her brother; Cervical cancer in her daughter; Hypertension in her mother; Pneumonia in her father; Stomach cancer in her brother; Stroke in her brother.She reports that she has never smoked. She has never used smokeless tobacco. She reports that she does not drink alcohol and does not use drugs.    ROS Review of Systems  Unable to perform ROS: Dementia    Objective:  BP 124/67   Pulse 65   Temp 98 F (36.7 C) (Temporal)   Resp 20   Ht 4' 11.5" (1.511 m)   Wt 115 lb 6 oz (52.3 kg)   SpO2 97%   BMI 22.91 kg/m   BP Readings from Last 3 Encounters:  01/12/20 124/67  01/08/20 129/60  01/04/20 119/65    Wt Readings from Last 3 Encounters:  01/12/20 115 lb 6 oz (52.3 kg)  01/04/20 114 lb 9.6 oz (52 kg)  12/29/19 112 lb 8 oz (51 kg)     Physical Exam Constitutional:      General: She is not in acute distress.    Appearance: She is  well-developed.  HENT:     Head: Normocephalic and atraumatic.  Eyes:     Conjunctiva/sclera: Conjunctivae normal.     Pupils: Pupils are equal, round, and reactive to light.  Neck:     Thyroid: No thyromegaly.  Cardiovascular:     Rate and Rhythm: Normal rate and regular rhythm.     Heart sounds: Normal heart sounds. No murmur heard.   Pulmonary:     Effort: Pulmonary effort is normal. No respiratory distress.     Breath sounds: Normal breath sounds. No wheezing or rales.    Abdominal:     General: Bowel sounds are normal. There is no distension.     Palpations: Abdomen is soft.     Tenderness: There is no abdominal tenderness.  Musculoskeletal:        General: Normal range of motion.     Cervical back: Normal range of motion and neck supple.  Lymphadenopathy:     Cervical: No cervical adenopathy.  Skin:    General: Skin is warm and dry.  Neurological:     Mental Status: She is alert and oriented to person, place, and time.  Psychiatric:        Attention and Perception: She is inattentive.        Speech: Speech normal.        Behavior: Behavior is slowed and withdrawn. Behavior is cooperative.        Cognition and Memory: Cognition is impaired.       Assessment & Plan:   Diagnoses and all orders for this visit:  Essential hypertension -     CBC with Differential/Platelet -     CMP14+EGFR  PAF (paroxysmal atrial fibrillation) (HCC) -     CBC with Differential/Platelet -     CMP14+EGFR  Iron deficiency anemia due to chronic blood loss -     CBC with Differential/Platelet  Other orders -     apixaban (ELIQUIS) 2.5 MG TABS tablet; Take 1 tablet (2.5 mg total) by mouth 2 (two) times daily.       I have changed Crystal Cooper's apixaban. I am also having her maintain her multivitamin-iron-minerals-folic acid, acetaminophen, Biotin, ferrous sulfate, metoprolol tartrate, albuterol, Caltrate 600+D3, OVER THE COUNTER MEDICATION, Breztri Aerosphere, levothyroxine, furosemide, potassium chloride SA, sulfamethoxazole-trimethoprim, famotidine, pantoprazole, and memantine.  Allergies as of 01/12/2020      Reactions   Hctz [hydrochlorothiazide] Other (See Comments)   Pt was ill and this affected her kidneys   Aspirin Other (See Comments)   Cardiologist said the patient is to not take this   Codeine Other (See Comments)   Made the patient feel ill, has not had any problems since 1977      Medication List       Accurate as of January 12, 2020   7:09 PM. If you have any questions, ask your nurse or doctor.        acetaminophen 500 MG tablet Commonly known as: TYLENOL Take 500 mg by mouth every 6 (six) hours as needed for mild pain or headache.   albuterol 108 (90 Base) MCG/ACT inhaler Commonly known as: ProAir HFA Inhale 2 puffs into the lungs every 4 (four) hours as needed for wheezing or shortness of breath.   apixaban 2.5 MG Tabs tablet Commonly known as: Eliquis Take 1 tablet (2.5 mg total) by mouth 2 (two) times daily. What changed:   medication strength  additional instructions Changed by: [name], MD   Biotin 10 MG Caps Take  10 mg by mouth daily.   Breztri Aerosphere 160-9-4.8 MCG/ACT Aero Generic drug: Budeson-Glycopyrrol-Formoterol Inhale 2 Inhalers into the lungs in the morning and at bedtime.   Caltrate 600+D3 600-800 MG-UNIT Tabs Generic drug: Calcium Carb-Cholecalciferol Take 1 tablet by mouth daily.   famotidine 20 MG tablet Commonly known as: Pepcid Take 1 tablet (20 mg total) by mouth at bedtime.   ferrous sulfate 325 (65 FE) MG tablet Take 1 tablet (325 mg total) by mouth 2 (two) times daily with a meal.   furosemide 20 MG tablet Commonly known as: LASIX Take 3 tablets (60 mg total) by mouth daily.   levothyroxine 25 MCG tablet Commonly known as: SYNTHROID Take 1 tablet (25 mcg total) by mouth daily.   memantine 10 MG tablet Commonly known as: NAMENDA Take 1 tablet (10 mg total) by mouth 2 (two) times daily.   metoprolol tartrate 25 MG tablet Commonly known as: LOPRESSOR Take 1 tablet (25 mg total) by mouth 2 (two) times daily.   multivitamin-iron-minerals-folic acid chewable tablet Chew 1 tablet by mouth daily.   OVER THE COUNTER MEDICATION Take 0.5 tablets by mouth daily as needed (for allergy). Patient not sure about the name.   pantoprazole 40 MG tablet Commonly known as: PROTONIX TAKE 1 TABLET BY MOUTH TWICE DAILY BEFORE MEAL(S)   potassium chloride SA 20 MEQ  tablet Commonly known as: KLOR-CON Take 1 tablet (20 mEq total) by mouth daily.   sulfamethoxazole-trimethoprim 800-160 MG tablet Commonly known as: Bactrim DS Take 1 tablet by mouth 2 (two) times daily.        Follow-up: Return in about 1 month (around 02/12/2020).  [name], M.D.

## 2020-01-13 ENCOUNTER — Telehealth: Payer: Self-pay | Admitting: Pharmacist

## 2020-01-13 ENCOUNTER — Other Ambulatory Visit: Payer: Medicare Other

## 2020-01-13 DIAGNOSIS — Z87440 Personal history of urinary (tract) infections: Secondary | ICD-10-CM

## 2020-01-13 LAB — CBC WITH DIFFERENTIAL/PLATELET
Basophils Absolute: 0.1 10*3/uL (ref 0.0–0.2)
Basos: 2 %
EOS (ABSOLUTE): 0.1 10*3/uL (ref 0.0–0.4)
Eos: 3 %
Hematocrit: 24.6 % — ABNORMAL LOW (ref 34.0–46.6)
Hemoglobin: 8 g/dL — CL (ref 11.1–15.9)
Immature Grans (Abs): 0 10*3/uL (ref 0.0–0.1)
Immature Granulocytes: 1 %
Lymphocytes Absolute: 1 10*3/uL (ref 0.7–3.1)
Lymphs: 24 %
MCH: 32 pg (ref 26.6–33.0)
MCHC: 32.5 g/dL (ref 31.5–35.7)
MCV: 98 fL — ABNORMAL HIGH (ref 79–97)
Monocytes Absolute: 0.5 10*3/uL (ref 0.1–0.9)
Monocytes: 12 %
Neutrophils Absolute: 2.4 10*3/uL (ref 1.4–7.0)
Neutrophils: 58 %
Platelets: 192 10*3/uL (ref 150–450)
RBC: 2.5 x10E6/uL — CL (ref 3.77–5.28)
RDW: 13.5 % (ref 11.7–15.4)
WBC: 4.1 10*3/uL (ref 3.4–10.8)

## 2020-01-13 LAB — CMP14+EGFR
ALT: 13 IU/L (ref 0–32)
AST: 25 IU/L (ref 0–40)
Albumin/Globulin Ratio: 1.6 (ref 1.2–2.2)
Albumin: 3.5 g/dL (ref 3.5–4.6)
Alkaline Phosphatase: 79 IU/L (ref 48–121)
BUN/Creatinine Ratio: 17 (ref 12–28)
BUN: 27 mg/dL (ref 10–36)
Bilirubin Total: 0.3 mg/dL (ref 0.0–1.2)
CO2: 29 mmol/L (ref 20–29)
Calcium: 9.1 mg/dL (ref 8.7–10.3)
Chloride: 98 mmol/L (ref 96–106)
Creatinine, Ser: 1.58 mg/dL — ABNORMAL HIGH (ref 0.57–1.00)
GFR calc Af Amer: 33 mL/min/{1.73_m2} — ABNORMAL LOW (ref >59)
GFR calc non Af Amer: 28 mL/min/{1.73_m2} — ABNORMAL LOW (ref >59)
Globulin, Total: 2.2 g/dL (ref 1.5–4.5)
Glucose: 96 mg/dL (ref 65–99)
Potassium: 4.3 mmol/L (ref 3.5–5.2)
Sodium: 138 mmol/L (ref 134–144)
Total Protein: 5.7 g/dL — ABNORMAL LOW (ref 6.0–8.5)

## 2020-01-13 LAB — MICROSCOPIC EXAMINATION
RBC, Urine: NONE SEEN /hpf (ref 0–2)
Renal Epithel, UA: NONE SEEN /hpf

## 2020-01-13 LAB — URINALYSIS, COMPLETE
Bilirubin, UA: NEGATIVE
Glucose, UA: NEGATIVE
Ketones, UA: NEGATIVE
Nitrite, UA: NEGATIVE
Protein,UA: NEGATIVE
RBC, UA: NEGATIVE
Specific Gravity, UA: 1.015 (ref 1.005–1.030)
Urobilinogen, Ur: 0.2 mg/dL (ref 0.2–1.0)
pH, UA: 6.5 (ref 5.0–7.5)

## 2020-01-13 NOTE — Telephone Encounter (Signed)
Eliquis samples provided to daughter [id number], EXP 3/23 #2 boxes

## 2020-01-18 ENCOUNTER — Telehealth: Payer: Self-pay | Admitting: Family Medicine

## 2020-01-18 ENCOUNTER — Other Ambulatory Visit: Payer: Self-pay

## 2020-01-18 ENCOUNTER — Ambulatory Visit (INDEPENDENT_AMBULATORY_CARE_PROVIDER_SITE_OTHER): Payer: Medicare Other | Admitting: *Deleted

## 2020-01-18 DIAGNOSIS — R710 Precipitous drop in hematocrit: Secondary | ICD-10-CM

## 2020-01-18 MED ORDER — CYANOCOBALAMIN 1000 MCG/ML IJ SOLN
1000.0000 ug | Freq: Once | INTRAMUSCULAR | Status: AC
  Administered 2020-01-18: 1000 ug via INTRAMUSCULAR

## 2020-01-18 NOTE — Telephone Encounter (Signed)
Pt to come in for 1 time only B12 injection per result notes from [facility]. Pt scheduled with nurse 7/28/21at 8:30 for injection.

## 2020-01-18 NOTE — Progress Notes (Deleted)
Cardiology Office Note   Date:  01/18/2020   ID:  Crystal Cooper, DOB Feb 15, 1927, MRN [id number]  PCP:  [name], MD  Cardiologist: Dr. Stanford Breed EP: Dr. Curt Bears No chief complaint on file.    History of Present Illness: Crystal Cooper is a 86 y.o. female who presents for ongoing assessment and management of paroxysmal atrial fibrillation, status post AVNRT in 2003, aortic valve stenosis, CAD.  She also had TAVR on 10/2015, subsequent atrial flutter with cardioversion on 03/02/2016.  Prior history of GI bleed in October 2019 with a hemoglobin dropping to 5.4.  She was treated for duodenitis and duodenal AVM.  Eliquis was reinstated.  It was noted that the patient's hemoglobin was 8.9 with hematocrit of 28.5 while being evaluated in ED on 12/13/2019.  This is prior to diuresis.  She also has pacemaker, most recently checked remotely on 11/11/2019.  This revealed that she was in A. fib battery and lead parameters were stable.  She was last seen by me on 12/15/2019.  She was continued on apixaban 2.5 mg twice daily but was also noted to have a hemoglobin 8.9 on review of labs.  She was to continue on metoprolol 25 mg twice daily with good heart rate control.  Follow-up CBC was recommended by PCP with consideration of stopping Eliquis should hemoglobin continues to decrease especially in light of prior GI bleed in 2019.  She was to continue on iron replacement therapy and iron infusions by Dr. Claudina Lick.   She was euvolemic at the time and was to continue daily weights and Lasix 60 mg daily.  She was advised on letting us know if she gained 3 to 5 pounds in 2 to 3 days.  Her daughter who is with her verbalized understanding and agreed to call us for directions concerning extra doses of the Lasix.  She saw her PCP, Dr. Livia Snellen, on 01/12/2020 at which time a CBC was ordered.   Past Medical History:  Diagnosis Date   AKI (acute kidney injury) ([place])    Anemia    years ago   Aortic stenosis     Arthritis    Asthma    Atrial fibrillation (HCC)    CHF (congestive heart failure) ([place]) 11/2014   CKD (chronic kidney disease)    Dementia (HCC)    Family history of adverse reaction to anesthesia    2 daughters would have N/V   Gastric AVM    GI bleed    Glaucoma    Hearing loss    Heart murmur    HTN (hypertension)    Hypokalemia    Pneumonia    Prolapsing mitral leaflet syndrome    Shortness of breath dyspnea    with exertion   Stroke [facility])    SVT (supraventricular tachycardia) (HCC)    S/P ablation of AVNRT in 2003    Past Surgical History:  Procedure Laterality Date   APPENDECTOMY     BIOPSY  04/07/2018   Procedure: BIOPSY;  Surgeon: Jerene Bears, MD;  Location: [place];  Service: Gastroenterology;;   BLADDER SURGERY     CARDIAC CATHETERIZATION N/A 09/26/2015   Procedure: Right/Left Heart Cath and Coronary Angiography;  Surgeon: Burnell Blanks, MD;  Location: [place] CV LAB;  Service: Cardiovascular;  Laterality: N/A;   CARDIAC SURGERY     CARDIOVERSION N/A 03/02/2016   Procedure: CARDIOVERSION;  Surgeon: Thayer Headings, MD;  Location: [place];  Service: Cardiovascular;  Laterality: N/A;   EP IMPLANTABLE  DEVICE N/A 10/26/2015   Procedure: Pacemaker Implant;  Surgeon: Will Meredith Leeds, MD;  Location: [place] [facility];  Service: Cardiovascular;  Laterality: N/A;   ESOPHAGOGASTRODUODENOSCOPY (EGD) WITH PROPOFOL N/A 04/07/2018   Procedure: ESOPHAGOGASTRODUODENOSCOPY (EGD) WITH PROPOFOL;  Surgeon: Jerene Bears, MD;  Location[facility] ENDOSCOPY;  Service: Gastroenterology;  Laterality: N/A;   ESOPHAGOGASTRODUODENOSCOPY (EGD) WITH PROPOFOL N/A 10/12/2019   Procedure: ESOPHAGOGASTRODUODENOSCOPY (EGD) WITH PROPOFOL;  Surgeon: Danie Binder, MD;  [facility];  Service: Endoscopy;  Laterality: N/A;   EYE SURGERY Bilateral    cataract surgery   HOT HEMOSTASIS N/A 04/07/2018   Procedure: HOT HEMOSTASIS (ARGON PLASMA  COAGULATION/BICAP);  Surgeon: Jerene Bears, MD;  Location: [facility] ENDOSCOPY;  Service: Gastroenterology;  Laterality: N/A;   HOT HEMOSTASIS  10/12/2019   Procedure: HOT HEMOSTASIS (ARGON PLASMA COAGULATION/BICAP);  Surgeon: Danie Binder, MD;  [facility];  Service: Endoscopy;;   NASAL SINUS SURGERY     PACEMAKER INSERTION     TEE WITHOUT CARDIOVERSION N/A 10/25/2015   Procedure: TRANSESOPHAGEAL ECHOCARDIOGRAM (TEE);  Surgeon: Burnell Blanks, MD;  Location: [place];  Service: Open Heart Surgery;  Laterality: N/A;   TRANSCATHETER AORTIC VALVE REPLACEMENT, TRANSFEMORAL Right 10/25/2015   Procedure: TRANSCATHETER AORTIC VALVE REPLACEMENT, TRANSFEMORAL;  Surgeon: Burnell Blanks, MD;  Location: [place];  Service: Open Heart Surgery;  Laterality: Right;     Current Outpatient Medications  Medication Sig Dispense Refill   acetaminophen (TYLENOL) 500 MG tablet Take 500 mg by mouth every 6 (six) hours as needed for mild pain or headache.      albuterol (PROAIR HFA) 108 (90 Base) MCG/ACT inhaler Inhale 2 puffs into the lungs every 4 (four) hours as needed for wheezing or shortness of breath. 18 g 11   apixaban (ELIQUIS) 2.5 MG TABS tablet Take 1 tablet (2.5 mg total) by mouth 2 (two) times daily. 60 tablet 5   Biotin 10 MG CAPS Take 10 mg by mouth daily.      Budeson-Glycopyrrol-Formoterol (BREZTRI AEROSPHERE) 160-9-4.8 MCG/ACT AERO Inhale 2 Inhalers into the lungs in the morning and at bedtime. 5.9 g 1   Calcium Carb-Cholecalciferol (CALTRATE 600+D3) 600-800 MG-UNIT TABS Take 1 tablet by mouth daily.     famotidine (PEPCID) 20 MG tablet Take 1 tablet (20 mg total) by mouth at bedtime. 90 tablet 1   ferrous sulfate 325 (65 FE) MG tablet Take 1 tablet (325 mg total) by mouth 2 (two) times daily with a meal.  3   furosemide (LASIX) 20 MG tablet Take 3 tablets (60 mg total) by mouth daily. 270 tablet 3   levothyroxine (SYNTHROID) 25 MCG tablet Take 1 tablet (25 mcg total)  by mouth daily. 30 tablet 2   memantine (NAMENDA) 10 MG tablet Take 1 tablet (10 mg total) by mouth 2 (two) times daily. 180 tablet 1   metoprolol tartrate (LOPRESSOR) 25 MG tablet Take 1 tablet (25 mg total) by mouth 2 (two) times daily. 180 tablet 3   multivitamin-iron-minerals-folic acid (CENTRUM) chewable tablet Chew 1 tablet by mouth daily.     OVER THE COUNTER MEDICATION Take 0.5 tablets by mouth daily as needed (for allergy). Patient not sure about the name.     pantoprazole (PROTONIX) 40 MG tablet TAKE 1 TABLET BY MOUTH TWICE DAILY BEFORE MEAL(S) 60 tablet 0   potassium chloride SA (KLOR-CON) 20 MEQ tablet Take 1 tablet (20 mEq total) by mouth daily. 90 tablet 3   sulfamethoxazole-trimethoprim (BACTRIM DS) 800-160 MG  tablet Take 1 tablet by mouth 2 (two) times daily. 20 tablet 0   No current facility-administered medications for this visit.    Allergies:   Hctz [hydrochlorothiazide], Aspirin, and Codeine    Social History:  The patient  reports that she has never smoked. She has never used smokeless tobacco. She reports that she does not drink alcohol and does not use drugs.   Family History:  The patient's family history includes AAA (abdominal aortic aneurysm) in her brother; Cervical cancer in her daughter; Hypertension in her mother; Pneumonia in her father; Stomach cancer in her brother; Stroke in her brother.    ROS: All other systems are reviewed and negative. Unless otherwise mentioned in H&P    PHYSICAL EXAM: VS:  There were no vitals taken for this visit. , BMI There is no height or weight on file to calculate BMI. GEN: Well nourished, well developed, in no acute distress HEENT: normal Neck: no JVD, carotid bruits, or masses Cardiac: ***RRR; no murmurs, rubs, or gallops,no edema  Respiratory:  Clear to auscultation bilaterally, normal work of breathing GI: soft, nontender, nondistended, + BS MS: no deformity or atrophy Skin: warm and dry, no rash Neuro:   Strength and sensation are intact Psych: euthymic mood, full affect   EKG:  EKG {ACTION; IS/IS [id number]} ordered today. The ekg ordered today demonstrates ***   Recent Labs: 10/12/2019: Magnesium 2.0 12/13/2019: B Natriuretic Peptide 468.0 12/31/2019: TSH 3.880 01/12/2020: ALT 13; BUN 27; Creatinine, Ser 1.58; Hemoglobin 8.0; Platelets 192; Potassium 4.3; Sodium 138    Lipid Panel    Component Value Date/Time   CHOL 129 09/29/2019 1021   TRIG 101 09/29/2019 1021   HDL 59 09/29/2019 1021   CHOLHDL 2.2 09/29/2019 1021   CHOLHDL 2.5 08/27/2019 0117   VLDL 12 08/27/2019 0117   LDLCALC 51 09/29/2019 1021      Wt Readings from Last 3 Encounters:  01/12/20 115 lb 6 oz (52.3 kg)  01/04/20 114 lb 9.6 oz (52 kg)  12/29/19 112 lb 8 oz (51 kg)      Other studies Reviewed: Echocardiogram 07/25/2019 1. Left ventricular ejection fraction, by visual estimation, is 60 to  65%. The left ventricle has normal function. There is mildly increased  left ventricular hypertrophy.  2. Abnormal septal motion consistent with RV pacemaker.  3. Elevated left atrial pressure.  4. Left ventricular diastolic parameters are consistent with Grade II  diastolic dysfunction (pseudonormalization).  5. The left ventricle has no regional wall motion abnormalities.  6. Global right ventricle has normal systolic function.The right  ventricular size is normal. No increase in right ventricular wall  thickness.  7. Left atrial size was mild-moderately dilated.  8. The mitral valve is degenerative. Mild mitral valve regurgitation.  9. The tricuspid valve is grossly normal.  10. 29 mm Edwards S3. Vmax 1.5 m/s, mean gradient 5.3 mmHG, EOA 1.63 cm2,  DI 0.52. No regurgitation or paravalvular leak.  11. Mildly elevated pulmonary artery systolic pressure.  12. The tricuspid regurgitant velocity is 3.01 m/s, and with an assumed  right atrial pressure of 3 mmHg, the estimated right ventricular systolic    pressure is mildly elevated at 39.3 mmHg.  13. The inferior vena cava is normal in size with greater than 50%  respiratory variability, suggesting right atrial pressure of 3 mmHg.     ASSESSMENT AND PLAN:  1.  ***   Current medicines are reviewed at length with the patient today.  I have spent *** dedicated  to the care of this patient on the date of this encounter to include pre-visit review of records, assessment, management and diagnostic testing,with shared decision making.  Labs/ tests ordered today include: *** Phill Myron. [place] Pugh, ANP, AACC   01/18/2020 7:39 PM    [facility] [address] Office [phone] Fax [phone]  Notice: This dictation was prepared with Dragon dictation along with smaller phrase technology. Any transcriptional errors that result from this process are unintentional and may not be corrected upon review.

## 2020-01-20 ENCOUNTER — Ambulatory Visit: Payer: Medicare Other

## 2020-01-20 ENCOUNTER — Ambulatory Visit (INDEPENDENT_AMBULATORY_CARE_PROVIDER_SITE_OTHER): Payer: Medicare Other | Admitting: Licensed Clinical Social Worker

## 2020-01-20 DIAGNOSIS — N1832 Chronic kidney disease, stage 3b: Secondary | ICD-10-CM

## 2020-01-20 DIAGNOSIS — I5032 Chronic diastolic (congestive) heart failure: Secondary | ICD-10-CM | POA: Diagnosis not present

## 2020-01-20 DIAGNOSIS — K219 Gastro-esophageal reflux disease without esophagitis: Secondary | ICD-10-CM

## 2020-01-20 DIAGNOSIS — I10 Essential (primary) hypertension: Secondary | ICD-10-CM | POA: Diagnosis not present

## 2020-01-20 NOTE — Patient Instructions (Addendum)
Licensed Clinical Education officer, museum Visit Information  Goals:      Client will communicate with LCSW in next 30 days to talk about ADLs completion and challenges in doing daily activities of clinet (pt-stated)           Current Barriers:   Challenges with ADLs completion in client with Chronic Diagnoses of  HTN< CKD, Hyponatremia, GERD, CHF  Some memory challenges  Clinical Social Work Clinical Goal(s):   LCSW will communicate with client in next 30 days to talk with client about ADLs completion and challenges in doing daily activities for client  Interventions:  Discussed CCM program support with Dimple Nanas of client  Encouraged client/[name] Burroughs to communicate with RNCM as needed for nursing support  Talked with [name] about ADLs completion daily for client  Talked with [name] about physical therapy sessions received by client  Talked with [name] about social support network for client (client's daughters are supportive)  Talked with [name] about ambulation challenges of client   Talked with Marin Roberts, daughter, about client daily needs  Talked with [name] about appetite of client  Talked with [name] about client recent UTI  Talked with [name] about Eppie's wish not to use a shower chair ([place] said she or client do not wish for Mattilyn to have a shower chair)  Talked with [name] about pain issues of client  Talked with [name] about client decreased energy   Patient Self Care Activities:  Attends scheduled medical appointments   SELF CARE DEFICITS: Challenges in ADLs completion Some mobility challenges    Initial goal documentation     Follow Up Plan: LCSW to call client/daughter, [name],in next 4 weeks to talk with client /daughterabout daily ADLs completion of client and about client challenges in doing daily activities.  Materials Provided: No  The patient/[name] Burroughs, daughter of patient verbalized understanding of  instructions provided today and declined a print copy of patient instruction materials.   [name].Keavon Sensing MSW, LCSW Licensed Clinical Social Worker [facility] [phone]

## 2020-01-20 NOTE — Chronic Care Management (AMB) (Signed)
Chronic Care Management    Clinical Social Work Follow Up Note  01/20/2020 Name: LUZ MARES MRN: [id number] DOB: 1927/03/31  MISHEL SANS is a 86 y.o. year old female who is a primary care patient of Stacks, [name], MD. The CCM team was consulted for assistance with [employer] .   Review of patient status, including review of consultants reports, other relevant assessments, and collaboration with appropriate care team members and the patient's provider was performed as part of comprehensive patient evaluation and provision of chronic care management services.    SDOH (Social Determinants of Health) assessments performed: No; risk for physical inactivity; risk for stress; risk for depression; risk for social isolation    Chronic Care Management from 09/11/2019 in [facility]  PHQ-9 Total Score 5       GAD 7 : Generalized Anxiety Score 09/11/2019  Nervous, Anxious, on Edge 1  Control/stop worrying 1  Worry too much - different things 1  Trouble relaxing 0  Restless 0  Easily annoyed or irritable 0  Afraid - awful might happen 0  Total GAD 7 Score 3  Anxiety Difficulty Somewhat difficult    Outpatient Encounter Medications as of 01/20/2020  Medication Sig  . acetaminophen (TYLENOL) 500 MG tablet Take 500 mg by mouth every 6 (six) hours as needed for mild pain or headache.   . albuterol (PROAIR HFA) 108 (90 Base) MCG/ACT inhaler Inhale 2 puffs into the lungs every 4 (four) hours as needed for wheezing or shortness of breath.  [name] apixaban (ELIQUIS) 2.5 MG TABS tablet Take 1 tablet (2.5 mg total) by mouth 2 (two) times daily.  . Biotin 10 MG CAPS Take 10 mg by mouth daily.   . Budeson-Glycopyrrol-Formoterol (BREZTRI AEROSPHERE) 160-9-4.8 MCG/ACT AERO Inhale 2 Inhalers into the lungs in the morning and at bedtime.  . Calcium Carb-Cholecalciferol (CALTRATE 600+D3) 600-800 MG-UNIT TABS Take 1 tablet by mouth daily.  . famotidine (PEPCID) 20 MG tablet  Take 1 tablet (20 mg total) by mouth at bedtime.  . ferrous sulfate 325 (65 FE) MG tablet Take 1 tablet (325 mg total) by mouth 2 (two) times daily with a meal.  . furosemide (LASIX) 20 MG tablet Take 3 tablets (60 mg total) by mouth daily.  [name] levothyroxine (SYNTHROID) 25 MCG tablet Take 1 tablet (25 mcg total) by mouth daily.  . memantine (NAMENDA) 10 MG tablet Take 1 tablet (10 mg total) by mouth 2 (two) times daily.  . metoprolol tartrate (LOPRESSOR) 25 MG tablet Take 1 tablet (25 mg total) by mouth 2 (two) times daily.  . multivitamin-iron-minerals-folic acid (CENTRUM) chewable tablet Chew 1 tablet by mouth daily.  [name] OVER THE COUNTER MEDICATION Take 0.5 tablets by mouth daily as needed (for allergy). Patient not sure about the name.  . pantoprazole (PROTONIX) 40 MG tablet TAKE 1 TABLET BY MOUTH TWICE DAILY BEFORE MEAL(S)  . potassium chloride SA (KLOR-CON) 20 MEQ tablet Take 1 tablet (20 mEq total) by mouth daily.  [name] sulfamethoxazole-trimethoprim (BACTRIM DS) 800-160 MG tablet Take 1 tablet by mouth 2 (two) times daily.   No facility-administered encounter medications on file as of 01/20/2020.     Goals    .  Client will communicate with LCSW in next 30 days to talk about ADLs completion and challenges in doing daily activities of clinet (pt-stated)        Current Barriers:  . Challenges with ADLs completion in client with Chronic Diagnoses of  HTN< CKD, Hyponatremia, GERD,  CHF . Some memory challenges  Clinical Social Work Clinical Goal(s):  [name] LCSW will communicate with client in next 30 days to talk with client about ADLs completion and challenges in doing daily activities for client  Interventions: . Discussed CCM program support with Dimple Nanas of client . Encouraged client/[name] Burroughs to communicate with RNCM as needed for nursing support . Talked with [name] about ADLs completion daily for client . Talked with [name] about physical therapy sessions received by  client . Talked with [name] about social support network for client (client's daughters are supportive) . Talked with [name] about ambulation challenges of client  . Talked with Marin Roberts, daughter, about client daily needs . Talked with [name] about appetite of client . Talked with [name] about client recent UTI . Talked with [name] about Aianna's wish not to use a shower chair [name] said she or client do not wish for Gara to have a shower chair) . Talked with [name] about pain issues of client . Talked with [name] about client decreased energy   Patient Self Care Activities:  Attends scheduled medical appointments  . SELF CARE DEFICITS: Challenges in ADLs completion Some mobility challenges    Initial goal documentation     Follow Up Plan: LCSW to call client/daughter, [name],in next 4 weeks to talk with client /daughterabout daily ADLs completion of client and about client challenges in doing daily activities.  [name].Tayvin Preslar MSW, LCSW Licensed Clinical Social Worker [facility] [phone]

## 2020-01-21 ENCOUNTER — Telehealth: Payer: Medicare Other | Admitting: *Deleted

## 2020-01-21 ENCOUNTER — Telehealth: Payer: Self-pay | Admitting: Family Medicine

## 2020-01-21 NOTE — Telephone Encounter (Signed)
[name] aware.

## 2020-01-21 NOTE — Telephone Encounter (Signed)
It doesn't sound serious at this point. Just keep me posted if it takes a turn for the worse.

## 2020-01-21 NOTE — Telephone Encounter (Signed)
pts daughter states that the pt seems tired and weak today. She says she is eating and has had 30 oz of water since 11am today. Denies any signs of UTI or bleeding. Should they be concerned.

## 2020-01-21 NOTE — Telephone Encounter (Signed)
Daughter [name] called to let Stacks know that pt is really weak and not walking. Pt wants a nurse to call back for advice

## 2020-01-22 ENCOUNTER — Other Ambulatory Visit: Payer: Self-pay

## 2020-01-22 ENCOUNTER — Ambulatory Visit: Payer: Medicare Other | Admitting: Adult Health

## 2020-01-22 ENCOUNTER — Ambulatory Visit (INDEPENDENT_AMBULATORY_CARE_PROVIDER_SITE_OTHER): Payer: Medicare Other | Admitting: General Practice

## 2020-01-22 ENCOUNTER — Encounter: Payer: Self-pay | Admitting: General Practice

## 2020-01-22 VITALS — BP 120/60 | HR 69 | Ht 62.0 in | Wt 114.2 lb

## 2020-01-22 DIAGNOSIS — I5032 Chronic diastolic (congestive) heart failure: Secondary | ICD-10-CM

## 2020-01-22 DIAGNOSIS — D508 Other iron deficiency anemias: Secondary | ICD-10-CM

## 2020-01-22 DIAGNOSIS — J4520 Mild intermittent asthma, uncomplicated: Secondary | ICD-10-CM

## 2020-01-22 DIAGNOSIS — Z79899 Other long term (current) drug therapy: Secondary | ICD-10-CM

## 2020-01-22 DIAGNOSIS — I480 Paroxysmal atrial fibrillation: Secondary | ICD-10-CM

## 2020-01-22 DIAGNOSIS — I63511 Cerebral infarction due to unspecified occlusion or stenosis of right middle cerebral artery: Secondary | ICD-10-CM

## 2020-01-22 LAB — CBC
Hematocrit: 27.5 % — ABNORMAL LOW (ref 34.0–46.6)
Hemoglobin: 8.6 g/dL — ABNORMAL LOW (ref 11.1–15.9)
MCH: 30.3 pg (ref 26.6–33.0)
MCHC: 31.3 g/dL — ABNORMAL LOW (ref 31.5–35.7)
MCV: 97 fL (ref 79–97)
Platelets: 200 10*3/uL (ref 150–450)
RBC: 2.84 x10E6/uL — ABNORMAL LOW (ref 3.77–5.28)
RDW: 13.5 % (ref 11.7–15.4)
WBC: 4.6 10*3/uL (ref 3.4–10.8)

## 2020-01-22 NOTE — Progress Notes (Addendum)
Cardiology Clinic Note   Patient Name: Crystal Cooper Date of Encounter: 01/22/2020  Primary Care Provider:  [name], MD Primary Cardiologist:  [name], MD  Patient Profile    Crystal Cooper is a 86 y.o. female who presents for follow-up of her of paroxysmal atrial fibrillation, essential hypertension, and diastolic heart failure.    Past Medical History    Past Medical History:  Diagnosis Date  . AKI (acute kidney injury) ([place])   . Anemia    years ago  . Aortic stenosis   . Arthritis   . Asthma   . Atrial fibrillation ([place])   . CHF (congestive heart failure) ([place]) 11/2014  . CKD (chronic kidney disease)   . Dementia ([place])   . Family history of adverse reaction to anesthesia    2 daughters would have N/V  . Gastric AVM   . GI bleed   . Glaucoma   . Hearing loss   . Heart murmur   . HTN (hypertension)   . Hypokalemia   . Pneumonia   . Prolapsing mitral leaflet syndrome   . Shortness of breath dyspnea    with exertion  . Stroke ([place])   . SVT (supraventricular tachycardia) ([facility])    S/P ablation of AVNRT in 2003   Past Surgical History:  Procedure Laterality Date  . APPENDECTOMY    . BIOPSY  04/07/2018   Procedure: BIOPSY;  Surgeon: Jerene Bears, MD;  Location: [facility] ENDOSCOPY;  Service: Gastroenterology;;  . BLADDER SURGERY    . CARDIAC CATHETERIZATION N/A 09/26/2015   Procedure: Right/Left Heart Cath and Coronary Angiography;  Surgeon: Burnell Blanks, MD;  Location: [place] CV LAB;  Service: Cardiovascular;  Laterality: N/A;  . CARDIAC SURGERY    . CARDIOVERSION N/A 03/02/2016   Procedure: CARDIOVERSION;  Surgeon: Thayer Headings, MD;  Location: [place];  Service: Cardiovascular;  Laterality: N/A;  . EP IMPLANTABLE DEVICE N/A 10/26/2015   Procedure: Pacemaker Implant;  Surgeon: Will Meredith Leeds, MD;  Location: [place] CV LAB;  Service: Cardiovascular;  Laterality: N/A;  . ESOPHAGOGASTRODUODENOSCOPY (EGD) WITH PROPOFOL N/A  04/07/2018   Procedure: ESOPHAGOGASTRODUODENOSCOPY (EGD) WITH PROPOFOL;  Surgeon: Jerene Bears, MD;  Location: [facility] ENDOSCOPY;  Service: Gastroenterology;  Laterality: N/A;  . ESOPHAGOGASTRODUODENOSCOPY (EGD) WITH PROPOFOL N/A 10/12/2019   Procedure: ESOPHAGOGASTRODUODENOSCOPY (EGD) WITH PROPOFOL;  Surgeon: Danie Binder, MD;  [facility] ENDO [facility];  Service: Endoscopy;  Laterality: N/A;  . EYE SURGERY Bilateral    cataract surgery  . HOT HEMOSTASIS N/A 04/07/2018   Procedure: HOT HEMOSTASIS (ARGON PLASMA COAGULATION/BICAP);  Surgeon: Jerene Bears, MD;  Location: [facility] ENDOSCOPY;  Service: Gastroenterology;  Laterality: N/A;  . HOT HEMOSTASIS  10/12/2019   Procedure: HOT HEMOSTASIS (ARGON PLASMA COAGULATION/BICAP);  Surgeon: Danie Binder, MD;  [facility];  Service: Endoscopy;;  . NASAL SINUS SURGERY    . PACEMAKER INSERTION    . TEE WITHOUT CARDIOVERSION N/A 10/25/2015   Procedure: TRANSESOPHAGEAL ECHOCARDIOGRAM (TEE);  Surgeon: Burnell Blanks, MD;  Location: [place];  Service: Open Heart Surgery;  Laterality: N/A;  . TRANSCATHETER AORTIC VALVE REPLACEMENT, TRANSFEMORAL Right 10/25/2015   Procedure: TRANSCATHETER AORTIC VALVE REPLACEMENT, TRANSFEMORAL;  Surgeon: Burnell Blanks, MD;  Location: [place];  Service: Open Heart Surgery;  Laterality: Right;    Allergies  Allergies  Allergen Reactions  . Hctz [Hydrochlorothiazide] Other (See Comments)    Pt was ill and this affected her kidneys   . Aspirin Other (  See Comments)    Cardiologist said the patient is to not take this  . Codeine Other (See Comments)    Made the patient feel ill, has not had any problems since 1977    History of Present Illness    [name]  has a PMH of paroxysmal atrial fibrillation, status post AVNRT in 2003, aortic valve stenosis, CAD.She also had TAVR on 10/2015, subsequent atrial flutter with cardioversion on 03/02/2016.   Prior history of GI bleed in October 2019 with a hemoglobin  dropping to 5.4.  She was treated for duodenitis and duodenal AVM.  Eliquis was reinstated.  It was noted that her hemoglobin was 8.9 with hematocrit of 28.5 while being evaluated in ED on 12/13/2019.  This is prior to diuresis.  She also has pacemaker, most recently checked remotely on 11/11/2019.    It showed that she was in A. fib battery and lead parameters were stable.   She was last seen by Jory Sims, DNP on 12/15/2019.  She was continued on apixaban 2.5 mg twice daily but was also noted to have a hemoglobin 8.9 on review of labs.  She was to continue on metoprolol 25 mg twice daily with good heart rate control.  Follow-up CBC was recommended by PCP with consideration of stopping Eliquis should hemoglobin continue to decrease especially in light of prior GI bleed in 2019.  She was to continue on iron replacement therapy and iron infusions by Dr. Claudina Lick.    She was euvolemic at the time and was to continue daily weights and Lasix 60 mg daily.  She was advised on letting Crystal Cooper know if she gained 3 to 5 pounds in 2 to 3 days.  Her daughter who was with her verbalized understanding and agreed to call Crystal Cooper for directions concerning extra doses of the Lasix.   She saw her PCP, Dr. Livia Cooper, on 01/12/2020 at which time a CBC was ordered.  She presents to clinic today for follow-up evaluation and states she feels somewhat tired.  Her daughter indicates that neurology stated they wanted to keep her on her Eliquis medication.  However, she does not feel that they want to continue the medication.  They have not noticed any bleeding issues.  However,  her most recent blood work showed a hemoglobin of 8.0.  I will give them a list of iron rich foods, repeat CBC, stop her apixaban, and have her follow-up in 3 months for further evaluation.  Today she denies chest pain, increased shortness of breath, lower extremity edema, increased fatigue, palpitations, melena, hematuria, hemoptysis, diaphoresis, weakness,  presyncope, syncope, orthopnea, and PND.   Home Medications    Prior to Admission medications   Medication Sig Start Date End Date Taking? Authorizing Provider  acetaminophen (TYLENOL) 500 MG tablet Take 500 mg by mouth every 6 (six) hours as needed for mild pain or headache.     [provider]  albuterol (PROAIR HFA) 108 (90 Base) MCG/ACT inhaler Inhale 2 puffs into the lungs every 4 (four) hours as needed for wheezing or shortness of breath. 08/05/19   Baruch Gouty, FNP  apixaban (ELIQUIS) 2.5 MG TABS tablet Take 1 tablet (2.5 mg total) by mouth 2 (two) times daily. 01/12/20   [name], MD  Biotin 10 MG CAPS Take 10 mg by mouth daily.     [provider]  Budeson-Glycopyrrol-Formoterol (BREZTRI AEROSPHERE) 160-9-4.8 MCG/ACT AERO Inhale 2 Inhalers into the lungs in the morning and at bedtime. 11/27/19   Christiana Pellant,  Quinn Axe, NP  Calcium Carb-Cholecalciferol (CALTRATE 600+D3) 600-800 MG-UNIT TABS Take 1 tablet by mouth daily.    [provider]  famotidine (PEPCID) 20 MG tablet Take 1 tablet (20 mg total) by mouth at bedtime. 12/21/19 12/20/20  [name], MD  ferrous sulfate 325 (65 FE) MG tablet Take 1 tablet (325 mg total) by mouth 2 (two) times daily with a meal. 04/08/18   Eulogio Bear U, DO  furosemide (LASIX) 20 MG tablet Take 3 tablets (60 mg total) by mouth daily. 12/15/19   Lendon Colonel, NP  levothyroxine (SYNTHROID) 25 MCG tablet Take 1 tablet (25 mcg total) by mouth daily. 12/02/19   [name], MD  memantine (NAMENDA) 10 MG tablet Take 1 tablet (10 mg total) by mouth 2 (two) times daily. 01/04/20   Garvin Fila, MD  metoprolol tartrate (LOPRESSOR) 25 MG tablet Take 1 tablet (25 mg total) by mouth 2 (two) times daily. 05/20/19   Camnitz, Ocie Doyne, MD  multivitamin-iron-minerals-folic acid (CENTRUM) chewable tablet Chew 1 tablet by mouth daily.    [provider]  OVER THE COUNTER MEDICATION Take 0.5 tablets by mouth daily as needed  (for allergy). Patient not sure about the name.    [provider]  pantoprazole (PROTONIX) 40 MG tablet TAKE 1 TABLET BY MOUTH TWICE DAILY BEFORE MEAL(S) 01/01/20   [name], MD  potassium chloride SA (KLOR-CON) 20 MEQ tablet Take 1 tablet (20 mEq total) by mouth daily. 12/15/19   Lendon Colonel, NP  sulfamethoxazole-trimethoprim (BACTRIM DS) 800-160 MG tablet Take 1 tablet by mouth 2 (two) times daily. 12/21/19   [name], MD    Family History    Family History  Problem Relation Age of Onset  . Hypertension Mother   . Pneumonia Father   . Stomach cancer Brother   . Stroke Brother   . AAA (abdominal aortic aneurysm) Brother   . Cervical cancer Daughter   . Heart attack Neg Hx   . Colon cancer Neg Hx   . Esophageal cancer Neg Hx    She indicated that her mother is deceased. She indicated that her father is deceased. She indicated that two of her four sisters are alive. She indicated that two of her seven brothers are alive. She indicated that her maternal grandmother is deceased. She indicated that her maternal grandfather is deceased. She indicated that her paternal grandmother is deceased. She indicated that her paternal grandfather is deceased. She indicated that two of her three daughters are alive. She indicated that the status of her neg hx is unknown.  Social History    Social History   Socioeconomic History  . Marital status: Widowed    Spouse name: Not on file  . Number of children: 3  . Years of education: Not on file  . Highest education level: 12th grade  Occupational History  . Occupation: Retired  Tobacco Use  . Smoking status: Never Smoker  . Smokeless tobacco: Never Used  Vaping Use  . Vaping Use: Never used  Substance and Sexual Activity  . Alcohol use: No    Alcohol/week: 0.0 standard drinks  . Drug use: No  . Sexual activity: Not Currently  Other Topics Concern  . Not on file  Social History Narrative  . Not on file   Social  Determinants of Health   Financial Resource Strain: Low Risk   . Difficulty of Paying Living Expenses: Not hard at all  Food Insecurity: No Food Insecurity  .  Worried About Charity fundraiser in the Last Year: Never true  . Ran Out of Food in the Last Year: Never true  Transportation Needs: No Transportation Needs  . Lack of Transportation (Medical): No  . Lack of Transportation (Non-Medical): No  Physical Activity: Inactive  . Days of Exercise per Week: 0 days  . Minutes of Exercise per Session: 0 min  Stress: No Stress Concern Present  . Feeling of Stress : Not at all  Social Connections: Moderately Integrated  . Frequency of Communication with Friends and Family: More than three times a week  . Frequency of Social Gatherings with Friends and Family: More than three times a week  . Attends Religious Services: More than 4 times per year  . Active Member of Clubs or Organizations: Yes  . Attends Archivist Meetings: More than 4 times per year  . Marital Status: Widowed  Intimate Partner Violence: Not At Risk  . Fear of Current or Ex-Partner: No  . Emotionally Abused: No  . Physically Abused: No  . Sexually Abused: No     Review of Systems    General:  No chills, fever, night sweats or weight changes.  Cardiovascular:  No chest pain, dyspnea on exertion, edema, orthopnea, palpitations, paroxysmal nocturnal dyspnea. Dermatological: No rash, lesions/masses Respiratory: No cough, dyspnea Urologic: No hematuria, dysuria Abdominal:   No nausea, vomiting, diarrhea, bright red blood per rectum, melena, or hematemesis Neurologic:  No visual changes, wkns, changes in mental status. All other systems reviewed and are otherwise negative except as noted above.  Physical Exam    VS:  BP (!) 120/60   Pulse 69   Ht 5\' 2"  (1.575 m)   Wt 114 lb 3.2 oz (51.8 kg)   SpO2 98%   BMI 20.89 kg/m  , BMI Body mass index is 20.89 kg/m. GEN: Well nourished, well developed, in no acute  distress. HEENT: normal. Neck: Supple, no JVD, carotid bruits, or masses. Cardiac: RRR, no murmurs, rubs, or gallops. No clubbing, cyanosis, edema.  Radials/DP/PT 2+ and equal bilaterally.  Respiratory:  Respirations regular and unlabored, clear to auscultation bilaterally. GI: Soft, nontender, nondistended, BS + x 4. MS: no deformity or atrophy. Skin: warm and dry, no rash. Neuro:  Strength and sensation are intact. Psych: Normal affect.  Accessory Clinical Findings    Recent Labs: 10/12/2019: Magnesium 2.0 12/13/2019: B Natriuretic Peptide 468.0 12/31/2019: TSH 3.880 01/12/2020: ALT 13; BUN 27; Creatinine, Ser 1.58; Hemoglobin 8.0; Platelets 192; Potassium 4.3; Sodium 138   Recent Lipid Panel    Component Value Date/Time   CHOL 129 09/29/2019 1021   TRIG 101 09/29/2019 1021   HDL 59 09/29/2019 1021   CHOLHDL 2.2 09/29/2019 1021   CHOLHDL 2.5 08/27/2019 0117   VLDL 12 08/27/2019 0117   LDLCALC 51 09/29/2019 1021    ECG personally reviewed by me today-none today.  Echocardiogram 07/06/2019 1. Left ventricular ejection fraction, by visual estimation, is 60 to  65%. The left ventricle has normal function. There is mildly increased  left ventricular hypertrophy.   2. Abnormal septal motion consistent with RV pacemaker.   3. Elevated left atrial pressure.   4. Left ventricular diastolic parameters are consistent with Grade II  diastolic dysfunction (pseudonormalization).   5. The left ventricle has no regional wall motion abnormalities.   6. Global right ventricle has normal systolic function.The right  ventricular size is normal. No increase in right ventricular wall  thickness.   7. Left atrial size was  mild-moderately dilated.   8. The mitral valve is degenerative. Mild mitral valve regurgitation.   9. The tricuspid valve is grossly normal.  10. 29 mm Edwards S3. Vmax 1.5 m/s, mean gradient 5.3 mmHG, EOA 1.63 cm2,  DI 0.52. No regurgitation or paravalvular leak.  11. Mildly  elevated pulmonary artery systolic pressure.  12. The tricuspid regurgitant velocity is 3.01 m/s, and with an assumed  right atrial pressure of 3 mmHg, the estimated right ventricular systolic  pressure is mildly elevated at 39.3 mmHg.  13. The inferior vena cava is normal in size with greater than 50%  respiratory variability, suggesting right atrial pressure of 3 mmHg.      Assessment & Plan   Atrial fibrillation-heart rate today 69 BPM hemoglobin 8.0 on 01/12/2020.  Shared decision making between patient her daughter and myself was used to decide to discontinue her apixaban.  Risks of CVA explained as well as risks of bleeding with continued apixaban use.  Daughter expressed understanding.  Case also discussed with Dr. Stanford Breed. Stop apixaban-hemoglobin continues to decline. Continue metoprolol Heart healthy low-sodium diet-salty 6 given Increase physical activity as tolerated  Order CBC Recommend following up with PCP for evaluation/treatment with another iron infusion.   Chronic diastolic heart failure-euvolemic today.  Weight today 114.3 pounds.  No increased work of breathing or activity intolerance.  Echocardiogram 1/21 showed an EF of 60-65% and G2 DD, normal functioning TAVR. Continue furosemide, metoprolol May take extra dose of Lasix for a weight increase of 3 pounds overnight or 5 pounds in 1 week Daily weights Elevate lower extremities when not active, lower extremity support stockings Heart healthy low-sodium diet-salty 6 given Increase physical activity as tolerated  Asthma- Breathing well today.   Continue albuterol Followed by PCP   Anemia: On iron replacement therapy.  Will need to continue to follow hemoglobin and hematocrit.  Followed by PCP  Disposition: Follow-up with Dr. Stanford Breed or Jory Sims in 3 months.   Jossie Ng. Jaedin Regina NP-C    01/22/2020, 9:40 AM [address] Office [phone] Fax [phone]  Notice: This dictation was prepared with Dragon dictation along with smaller phrase technology. Any transcriptional errors that result from this process are unintentional and may not be corrected upon review.

## 2020-01-22 NOTE — Patient Instructions (Addendum)
Medication Instructions:  STOP ELIQUIS *If you need a refill on your cardiac medications before your next appointment, please call your pharmacy*  Lab Work: [place] If you have labs (blood work) drawn today and your tests are completely normal, you will receive your results only by:  [place] (if you have MyChart) OR A paper copy in the mail.  If you have any lab test that is abnormal or we need to change your treatment, we will call you to review the results. You may go to any [facility] that is convenient for you however, we do have a lab in our office that is able to assist you. You DO NOT need an appointment for our lab. The lab is open 8:00am and closes at 4:00pm. Lunch 12:45 - 1:45pm.  Special Instructions PLEASE READ AND FOLLOW IRON RICH FOODS ATTACHED  Follow-Up: Your next appointment:  6 month(s) Please call our office 2 months in advance to schedule this appointment In Person with [name], MD , Jory Sims, DNP -[place], FNP-C  At [facility], you and your health needs are our priority.  As part of our continuing mission to provide you with exceptional heart care, we have created designated Provider Care Teams.  These Care Teams include your primary Cardiologist (physician) and Advanced Practice Providers (APPs -  Physician Assistants and Nurse Practitioners) who all work together to provide you with the care you need, when you need it.    Iron-Rich Diet  Iron is a mineral that helps your body to produce hemoglobin. Hemoglobin is a protein in red blood cells that carries oxygen to your body's tissues. Eating too little iron may cause you to feel weak and tired, and it can increase your risk of infection. Iron is naturally found in many foods, and many foods have iron added to them (iron-fortified foods). You may need to follow an iron-rich diet if you do not have enough iron in your body due to certain medical conditions. The  amount of iron that you need each day depends on your age, your sex, and any medical conditions you have. Follow instructions from your health care provider or a diet and nutrition specialist (dietitian) about how much iron you should eat each day. What are tips for following this plan? Reading food labels  Check food labels to see how many milligrams (mg) of iron are in each serving. Cooking  Cook foods in pots and pans that are made from iron.  Take these steps to make it easier for your body to absorb iron from certain foods: ? Soak beans overnight before cooking. ? Soak whole grains overnight and drain them before using. ? Ferment flours before baking, such as by using yeast in bread dough. Meal planning  When you eat foods that contain iron, you should eat them with foods that are high in vitamin C. These include oranges, peppers, tomatoes, potatoes, and mango. Vitamin C helps your body to absorb iron. General information  Take iron supplements only as told by your health care provider. An overdose of iron can be life-threatening. If you were prescribed iron supplements, take them with orange juice or a vitamin C supplement.  When you eat iron-fortified foods or take an iron supplement, you should also eat foods that naturally contain iron, such as meat, poultry, and fish. Eating naturally iron-rich foods helps your body to absorb the iron that is added to other foods or contained in a  supplement.  Certain foods and drinks prevent your body from absorbing iron properly. Avoid eating these foods in the same meal as iron-rich foods or with iron supplements. These foods include: ? Coffee, black tea, and red wine. ? Milk, dairy products, and foods that are high in calcium. ? Beans and soybeans. ? Whole grains. What foods should I eat? Fruits Prunes. Raisins. Eat fruits high in vitamin C, such as oranges, grapefruits, and strawberries, alongside iron-rich foods. Vegetables Spinach  (cooked). Green peas. Broccoli. Fermented vegetables. Eat vegetables high in vitamin C, such as leafy greens, potatoes, bell peppers, and tomatoes, alongside iron-rich foods. Grains Iron-fortified breakfast cereal. Iron-fortified whole-wheat bread. Enriched rice. Sprouted grains. Meats and other proteins Beef liver. Oysters. Beef. Shrimp. [place]. Chicken. [place]. Sardines. Chickpeas. Nuts. Tofu. Pumpkin seeds. Beverages Tomato juice. Fresh orange juice. Prune juice. Hibiscus tea. Fortified instant breakfast shakes. Sweets and desserts Blackstrap molasses. Seasonings and condiments Tahini. Fermented soy sauce. Other foods Wheat germ. The items listed above may not be a complete list of recommended foods and beverages. Contact a dietitian for more information. What foods should I avoid? Grains Whole grains. Bran cereal. Bran flour. Oats. Meats and other proteins Soybeans. Products made from soy protein. Black beans. Lentils. Mung beans. Split peas. Dairy Milk. Cream. Cheese. Yogurt. Cottage cheese. Beverages Coffee. Black tea. Red wine. Sweets and desserts Cocoa. Chocolate. Ice cream. Other foods Basil. Oregano. Large amounts of parsley. The items listed above may not be a complete list of foods and beverages to avoid. Contact a dietitian for more information. Summary  Iron is a mineral that helps your body to produce hemoglobin. Hemoglobin is a protein in red blood cells that carries oxygen to your body's tissues.  Iron is naturally found in many foods, and many foods have iron added to them (iron-fortified foods).  When you eat foods that contain iron, you should eat them with foods that are high in vitamin C. Vitamin C helps your body to absorb iron.  Certain foods and drinks prevent your body from absorbing iron properly, such as whole grains and dairy products. You should avoid eating these foods in the same meal as iron-rich foods or with iron supplements. This information is  not intended to replace advice given to you by your health care provider. Make sure you discuss any questions you have with your health care provider. Document Revised: 05/24/2017 Document Reviewed: 05/07/2017 Elsevier Patient Education  2020 [employer].

## 2020-02-05 ENCOUNTER — Telehealth: Payer: Medicare Other

## 2020-02-09 ENCOUNTER — Ambulatory Visit (INDEPENDENT_AMBULATORY_CARE_PROVIDER_SITE_OTHER): Payer: Medicare Other | Admitting: Family Medicine

## 2020-02-09 ENCOUNTER — Other Ambulatory Visit: Payer: Self-pay

## 2020-02-09 ENCOUNTER — Encounter: Payer: Self-pay | Admitting: Family Medicine

## 2020-02-09 VITALS — BP 138/71 | HR 67 | Temp 97.1°F | Resp 20 | Ht 62.0 in | Wt 115.1 lb

## 2020-02-09 DIAGNOSIS — N3090 Cystitis, unspecified without hematuria: Secondary | ICD-10-CM

## 2020-02-09 DIAGNOSIS — R350 Frequency of micturition: Secondary | ICD-10-CM | POA: Diagnosis not present

## 2020-02-09 DIAGNOSIS — F0150 Vascular dementia without behavioral disturbance: Secondary | ICD-10-CM

## 2020-02-09 DIAGNOSIS — R262 Difficulty in walking, not elsewhere classified: Secondary | ICD-10-CM

## 2020-02-09 DIAGNOSIS — I6930 Unspecified sequelae of cerebral infarction: Secondary | ICD-10-CM | POA: Diagnosis not present

## 2020-02-09 DIAGNOSIS — R5381 Other malaise: Secondary | ICD-10-CM

## 2020-02-09 LAB — URINALYSIS, COMPLETE
Bilirubin, UA: NEGATIVE
Glucose, UA: NEGATIVE
Ketones, UA: NEGATIVE
Nitrite, UA: POSITIVE — AB
Protein,UA: NEGATIVE
Specific Gravity, UA: 1.015 (ref 1.005–1.030)
Urobilinogen, Ur: 0.2 mg/dL (ref 0.2–1.0)
pH, UA: 7 (ref 5.0–7.5)

## 2020-02-09 LAB — MICROSCOPIC EXAMINATION: RBC, Urine: NONE SEEN /hpf (ref 0–2)

## 2020-02-09 MED ORDER — POTASSIUM CHLORIDE CRYS ER 10 MEQ PO TBCR: 20.0000 meq | EXTENDED_RELEASE_TABLET | Freq: Two times a day (BID) | ORAL | 2 refills | Status: DC

## 2020-02-09 MED ORDER — SULFAMETHOXAZOLE-TRIMETHOPRIM 800-160 MG PO TABS: 1.0000 | ORAL_TABLET | Freq: Two times a day (BID) | ORAL | 0 refills | Status: DC

## 2020-02-09 NOTE — Progress Notes (Signed)
Subjective:  Patient ID: Crystal Cooper, female    DOB: 1927/05/21  Age: 86 y.o. MRN: [id number]  CC: Face to face for home health, PT   HPI Crystal Cooper presents for evaluation for home health. She is in need of continued physical therapy as result of the recent stroke she continues to have trouble with ambulation. She is unable to bathe herself. She is too scared to get in a tub or shower. Also she has trouble breathing and wheezes frequently with ambulation. She has been swelling in the feet and legs. Patient also has been having episodes of urinary incontinence. He is also having memory lapses in the afternoons after lunch time. Patient declines to bathe because she does not want to undress. However she has done well with sponge baths.head to toe in the past.  Depression screen [facility] 2/9 02/09/2020 01/12/2020 12/01/2019  Decreased Interest 0 0 0  Down, Depressed, Hopeless 0 0 0  PHQ - 2 Score 0 0 0  Altered sleeping - - -  Tired, decreased energy - - -  Change in appetite - - -  Feeling bad or failure about yourself  - - -  Trouble concentrating - - -  Moving slowly or fidgety/restless - - -  Suicidal thoughts - - -  PHQ-9 Score - - -  Difficult doing work/chores - - -  Some recent data might be hidden    History [place] has a past medical history of AKI (acute kidney injury) ([place]), Anemia, Aortic stenosis, Arthritis, Asthma, Atrial fibrillation ([place]), CHF (congestive heart failure) ([place]) (11/2014), CKD (chronic kidney disease), Dementia ([place]), Family history of adverse reaction to anesthesia, Gastric AVM, GI bleed, Glaucoma, Hearing loss, Heart murmur, HTN (hypertension), Hypokalemia, Pneumonia, Prolapsing mitral leaflet syndrome, Shortness of breath dyspnea, Stroke ([place]), and SVT (supraventricular tachycardia) ([place]).   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   Her family history includes AAA (abdominal aortic aneurysm) in her brother; Cervical cancer in her daughter; Hypertension in her mother; Pneumonia in her father; Stomach cancer in her brother; Stroke in her brother.She reports that she has never smoked. She has never used smokeless tobacco. She reports that she does not drink alcohol and does not use drugs.    ROS Review of Systems  Constitutional: Negative.   HENT: Negative for congestion.   Eyes: Negative for visual disturbance.  Respiratory: Negative for shortness of breath.   Cardiovascular: Negative for chest pain.  Gastrointestinal: Negative for abdominal pain, constipation, diarrhea, nausea and vomiting.  Genitourinary: Positive for frequency. Negative for difficulty urinating.  Musculoskeletal: Positive for arthralgias and myalgias.  Neurological: Negative for syncope and headaches.  Psychiatric/Behavioral: Positive for confusion.    Objective:  BP 138/71   Pulse 67   Temp (!) 97.1 F (36.2 C) (Temporal)   Resp 20   Ht 5' 2"  (1.575 m)   Wt 115 lb 2 oz (52.2 kg)   SpO2 97%   BMI 21.06 kg/m   BP Readings from Last 3 Encounters:  02/09/20 138/71  01/22/20 (!) 120/60  01/12/20 124/67    Wt Readings from Last 3 Encounters:  02/09/20 115 lb 2  oz (52.2 kg)  01/22/20 114 lb 3.2 oz (51.8 kg)  01/12/20 115 lb 6 oz (52.3 kg)     Physical Exam Constitutional:      General: She is not in acute distress.    Appearance: She is well-developed.  Cardiovascular:     Rate and Rhythm: Normal rate  and regular rhythm.  Pulmonary:     Breath sounds: Normal breath sounds.  Abdominal:     Palpations: Abdomen is soft.     Tenderness: There is no abdominal tenderness.  Skin:    General: Skin is warm and dry.  Neurological:     Mental Status: She is alert and oriented to person, place, and time.     Motor: Weakness present.     Gait: Gait abnormal.       Assessment & Plan:   Donae was seen today for face to face for home health, pt.  Diagnoses and all orders for this visit:  Frequent urination -     Urinalysis, Complete -     Urine Culture -     CBC with Differential/Platelet -     CMP14+EGFR -     Ambulatory referral to [place]  Vascular dementia without behavioral disturbance ([place]) -     CBC with Differential/Platelet -     CMP14+EGFR -     Ambulatory referral to [place] -     CBC with Differential/Platelet -     CMP14+EGFR -     Ambulatory referral to Home Health  Physical debility -     CBC with Differential/Platelet -     CMP14+EGFR -     Ambulatory referral to [place]  Impaired ambulation -     CBC with Differential/Platelet -     CMP14+EGFR -     Ambulatory referral to [place]  Other orders -     potassium chloride SA (KLOR-CON) 10 MEQ tablet; Take 2 tablets (20 mEq total) by mouth 2 (two) times daily. -     sulfamethoxazole-trimethoprim (BACTRIM DS) 800-160 MG tablet; Take 1 tablet by mouth 2 (two) times daily. -     Microscopic Examination       I have changed Crystal Cooper's potassium chloride SA to potassium chloride. I am also having her start on sulfamethoxazole-trimethoprim. Additionally, I am having her maintain her multivitamin-iron-minerals-folic acid, acetaminophen, Biotin, ferrous sulfate, metoprolol tartrate, albuterol, Caltrate 600+D3, OVER THE COUNTER MEDICATION, Breztri Aerosphere, levothyroxine, furosemide, famotidine, pantoprazole, and memantine.  Allergies as of 02/09/2020      Reactions   Hctz  [hydrochlorothiazide] Other (See Comments)   Pt was ill and this affected her kidneys   Aspirin Other (See Comments)   Cardiologist said the patient is to not take this   Codeine Other (See Comments)   Made the patient feel ill, has not had any problems since 1977      Medication List       Accurate as of February 09, 2020 10:58 PM. If you have any questions, ask your nurse or doctor.        acetaminophen 500 MG tablet Commonly known as: TYLENOL Take 500 mg by mouth every 6 (six) hours as needed for mild pain or headache.   albuterol 108 (90 Base) MCG/ACT inhaler Commonly known as: ProAir HFA Inhale 2 puffs into the lungs every 4 (four) hours as needed for wheezing or shortness of breath.   Biotin 10 MG Caps Take 10 mg by mouth daily.  Breztri Aerosphere 160-9-4.8 MCG/ACT Aero Generic drug: Budeson-Glycopyrrol-Formoterol Inhale 2 Inhalers into the lungs in the morning and at bedtime.   Caltrate 600+D3 600-800 MG-UNIT Tabs Generic drug: Calcium Carb-Cholecalciferol Take 1 tablet by mouth daily.   famotidine 20 MG tablet Commonly known as: Pepcid Take 1 tablet (20 mg total) by mouth at bedtime.   ferrous sulfate 325 (65 FE) MG tablet Take 1 tablet (325 mg total) by mouth 2 (two) times daily with a meal.   furosemide 20 MG tablet Commonly known as: LASIX Take 3 tablets (60 mg total) by mouth daily.   levothyroxine 25 MCG tablet Commonly known as: SYNTHROID Take 1 tablet (25 mcg total) by mouth daily.   memantine 10 MG tablet Commonly known as: NAMENDA Take 1 tablet (10 mg total) by mouth 2 (two) times daily.   metoprolol tartrate 25 MG tablet Commonly known as: LOPRESSOR Take 1 tablet (25 mg total) by mouth 2 (two) times daily.   multivitamin-iron-minerals-folic acid chewable tablet Chew 1 tablet by mouth daily.   OVER THE COUNTER MEDICATION Take 0.5 tablets by mouth daily as needed (for allergy). Patient not sure about the name.   pantoprazole 40 MG  tablet Commonly known as: PROTONIX TAKE 1 TABLET BY MOUTH TWICE DAILY BEFORE MEAL(S)   potassium chloride 10 MEQ tablet Commonly known as: KLOR-CON Take 2 tablets (20 mEq total) by mouth 2 (two) times daily. What changed:   medication strength  when to take this Changed by: [name], MD   sulfamethoxazole-trimethoprim 800-160 MG tablet Commonly known as: BACTRIM DS Take 1 tablet by mouth 2 (two) times daily. Started by: [name], MD        Follow-up: No follow-ups on file.  [name], M.D.

## 2020-02-10 ENCOUNTER — Ambulatory Visit (INDEPENDENT_AMBULATORY_CARE_PROVIDER_SITE_OTHER): Payer: Medicare Other | Admitting: *Deleted

## 2020-02-10 DIAGNOSIS — I480 Paroxysmal atrial fibrillation: Secondary | ICD-10-CM

## 2020-02-10 LAB — CUP PACEART REMOTE DEVICE CHECK
Battery Remaining Longevity: 48 mo
Battery Voltage: 2.99 V
Brady Statistic RA Percent Paced: 0 %
Brady Statistic RV Percent Paced: 95.66 %
Date Time Interrogation Session: 20210818091434
Implantable Lead Implant Date: 20170503
Implantable Lead Implant Date: 20170503
Implantable Lead Location: 753859
Implantable Lead Location: 753860
Implantable Lead Model: 5076
Implantable Lead Model: 5076
Implantable Pulse Generator Implant Date: 20170503
Lead Channel Impedance Value: 285 Ohm
Lead Channel Impedance Value: 323 Ohm
Lead Channel Impedance Value: 399 Ohm
Lead Channel Impedance Value: 399 Ohm
Lead Channel Pacing Threshold Amplitude: 1 V
Lead Channel Pacing Threshold Amplitude: 1 V
Lead Channel Pacing Threshold Pulse Width: 0.4 ms
Lead Channel Pacing Threshold Pulse Width: 0.4 ms
Lead Channel Sensing Intrinsic Amplitude: 4.125 mV
Lead Channel Sensing Intrinsic Amplitude: 4.125 mV
Lead Channel Sensing Intrinsic Amplitude: 9.875 mV
Lead Channel Sensing Intrinsic Amplitude: 9.875 mV
Lead Channel Setting Pacing Amplitude: 2 V
Lead Channel Setting Pacing Amplitude: 2.5 V
Lead Channel Setting Pacing Pulse Width: 0.4 ms
Lead Channel Setting Sensing Sensitivity: 2.8 mV

## 2020-02-10 LAB — CBC WITH DIFFERENTIAL/PLATELET
Basophils Absolute: 0.1 10*3/uL (ref 0.0–0.2)
Basos: 1 %
EOS (ABSOLUTE): 0.1 10*3/uL (ref 0.0–0.4)
Eos: 1 %
Hematocrit: 32.4 % — ABNORMAL LOW (ref 34.0–46.6)
Hemoglobin: 10.5 g/dL — ABNORMAL LOW (ref 11.1–15.9)
Immature Grans (Abs): 0 10*3/uL (ref 0.0–0.1)
Immature Granulocytes: 0 %
Lymphocytes Absolute: 0.9 10*3/uL (ref 0.7–3.1)
Lymphs: 17 %
MCH: 31.1 pg (ref 26.6–33.0)
MCHC: 32.4 g/dL (ref 31.5–35.7)
MCV: 96 fL (ref 79–97)
Monocytes Absolute: 0.5 10*3/uL (ref 0.1–0.9)
Monocytes: 9 %
Neutrophils Absolute: 3.6 10*3/uL (ref 1.4–7.0)
Neutrophils: 72 %
Platelets: 197 10*3/uL (ref 150–450)
RBC: 3.38 x10E6/uL — ABNORMAL LOW (ref 3.77–5.28)
RDW: 13 % (ref 11.7–15.4)
WBC: 5.1 10*3/uL (ref 3.4–10.8)

## 2020-02-10 LAB — CMP14+EGFR
ALT: 16 IU/L (ref 0–32)
AST: 28 IU/L (ref 0–40)
Albumin/Globulin Ratio: 1.3 (ref 1.2–2.2)
Albumin: 3.6 g/dL (ref 3.5–4.6)
Alkaline Phosphatase: 93 IU/L (ref 48–121)
BUN/Creatinine Ratio: 17 (ref 12–28)
BUN: 21 mg/dL (ref 10–36)
Bilirubin Total: 0.4 mg/dL (ref 0.0–1.2)
CO2: 29 mmol/L (ref 20–29)
Calcium: 8.9 mg/dL (ref 8.7–10.3)
Chloride: 96 mmol/L (ref 96–106)
Creatinine, Ser: 1.26 mg/dL — ABNORMAL HIGH (ref 0.57–1.00)
GFR calc Af Amer: 43 mL/min/{1.73_m2} — ABNORMAL LOW (ref >59)
GFR calc non Af Amer: 37 mL/min/{1.73_m2} — ABNORMAL LOW (ref >59)
Globulin, Total: 2.7 g/dL (ref 1.5–4.5)
Glucose: 100 mg/dL — ABNORMAL HIGH (ref 65–99)
Potassium: 4.1 mmol/L (ref 3.5–5.2)
Sodium: 137 mmol/L (ref 134–144)
Total Protein: 6.3 g/dL (ref 6.0–8.5)

## 2020-02-10 NOTE — Progress Notes (Signed)
Hello Ber[place],  Your lab result is normal and/or stable.Some minor variations that are not significant are commonly marked abnormal, but do not represent any medical problem for you.  Best regards, Hallee Mckenny, M.D.

## 2020-02-11 LAB — URINE CULTURE

## 2020-02-12 ENCOUNTER — Encounter (HOSPITAL_COMMUNITY): Payer: Self-pay | Admitting: Emergency Medicine

## 2020-02-12 ENCOUNTER — Inpatient Hospital Stay (HOSPITAL_COMMUNITY)
Admission: EM | Admit: 2020-02-12 | Discharge: 2020-02-14 | DRG: 194 | Disposition: A | Discharge status: Home / Self Care | Payer: Medicare Other | Attending: Internal Medicine | Admitting: Internal Medicine

## 2020-02-12 ENCOUNTER — Other Ambulatory Visit: Payer: Self-pay

## 2020-02-12 ENCOUNTER — Emergency Department (HOSPITAL_COMMUNITY): Payer: Medicare Other

## 2020-02-12 DIAGNOSIS — Z8673 Personal history of transient ischemic attack (TIA), and cerebral infarction without residual deficits: Secondary | ICD-10-CM

## 2020-02-12 DIAGNOSIS — J440 Chronic obstructive pulmonary disease with acute lower respiratory infection: Secondary | ICD-10-CM | POA: Diagnosis present

## 2020-02-12 DIAGNOSIS — Z8249 Family history of ischemic heart disease and other diseases of the circulatory system: Secondary | ICD-10-CM | POA: Diagnosis not present

## 2020-02-12 DIAGNOSIS — E039 Hypothyroidism, unspecified: Secondary | ICD-10-CM | POA: Diagnosis present

## 2020-02-12 DIAGNOSIS — D631 Anemia in chronic kidney disease: Secondary | ICD-10-CM | POA: Diagnosis present

## 2020-02-12 DIAGNOSIS — J449 Chronic obstructive pulmonary disease, unspecified: Secondary | ICD-10-CM | POA: Diagnosis not present

## 2020-02-12 DIAGNOSIS — I471 Supraventricular tachycardia: Secondary | ICD-10-CM | POA: Diagnosis present

## 2020-02-12 DIAGNOSIS — H9190 Unspecified hearing loss, unspecified ear: Secondary | ICD-10-CM | POA: Diagnosis present

## 2020-02-12 DIAGNOSIS — R0602 Shortness of breath: Secondary | ICD-10-CM

## 2020-02-12 DIAGNOSIS — F0390 Unspecified dementia without behavioral disturbance: Secondary | ICD-10-CM | POA: Diagnosis present

## 2020-02-12 DIAGNOSIS — Z800 Family history of malignant neoplasm of digestive organs: Secondary | ICD-10-CM

## 2020-02-12 DIAGNOSIS — E860 Dehydration: Secondary | ICD-10-CM | POA: Diagnosis present

## 2020-02-12 DIAGNOSIS — I5032 Chronic diastolic (congestive) heart failure: Secondary | ICD-10-CM | POA: Diagnosis present

## 2020-02-12 DIAGNOSIS — H409 Unspecified glaucoma: Secondary | ICD-10-CM | POA: Diagnosis present

## 2020-02-12 DIAGNOSIS — Z886 Allergy status to analgesic agent status: Secondary | ICD-10-CM

## 2020-02-12 DIAGNOSIS — J189 Pneumonia, unspecified organism: Secondary | ICD-10-CM | POA: Diagnosis not present

## 2020-02-12 DIAGNOSIS — I130 Hypertensive heart and chronic kidney disease with heart failure and stage 1 through stage 4 chronic kidney disease, or unspecified chronic kidney disease: Secondary | ICD-10-CM | POA: Diagnosis present

## 2020-02-12 DIAGNOSIS — J441 Chronic obstructive pulmonary disease with (acute) exacerbation: Secondary | ICD-10-CM

## 2020-02-12 DIAGNOSIS — K219 Gastro-esophageal reflux disease without esophagitis: Secondary | ICD-10-CM | POA: Diagnosis present

## 2020-02-12 DIAGNOSIS — Z79899 Other long term (current) drug therapy: Secondary | ICD-10-CM

## 2020-02-12 DIAGNOSIS — Z823 Family history of stroke: Secondary | ICD-10-CM | POA: Diagnosis not present

## 2020-02-12 DIAGNOSIS — Z20822 Contact with and (suspected) exposure to covid-19: Secondary | ICD-10-CM | POA: Diagnosis present

## 2020-02-12 DIAGNOSIS — Z8049 Family history of malignant neoplasm of other genital organs: Secondary | ICD-10-CM

## 2020-02-12 DIAGNOSIS — Z888 Allergy status to other drugs, medicaments and biological substances status: Secondary | ICD-10-CM

## 2020-02-12 DIAGNOSIS — N184 Chronic kidney disease, stage 4 (severe): Secondary | ICD-10-CM

## 2020-02-12 DIAGNOSIS — I10 Essential (primary) hypertension: Secondary | ICD-10-CM

## 2020-02-12 DIAGNOSIS — R195 Other fecal abnormalities: Secondary | ICD-10-CM | POA: Diagnosis not present

## 2020-02-12 DIAGNOSIS — N179 Acute kidney failure, unspecified: Secondary | ICD-10-CM | POA: Diagnosis present

## 2020-02-12 DIAGNOSIS — N1832 Chronic kidney disease, stage 3b: Secondary | ICD-10-CM | POA: Diagnosis not present

## 2020-02-12 DIAGNOSIS — I517 Cardiomegaly: Secondary | ICD-10-CM | POA: Diagnosis not present

## 2020-02-12 DIAGNOSIS — Z885 Allergy status to narcotic agent status: Secondary | ICD-10-CM | POA: Diagnosis not present

## 2020-02-12 DIAGNOSIS — Z79890 Hormone replacement therapy (postmenopausal): Secondary | ICD-10-CM

## 2020-02-12 LAB — CBC
HCT: 31.7 % — ABNORMAL LOW (ref 36.0–46.0)
Hemoglobin: 9.9 g/dL — ABNORMAL LOW (ref 12.0–15.0)
MCH: 30.9 pg (ref 26.0–34.0)
MCHC: 31.2 g/dL (ref 30.0–36.0)
MCV: 99.1 fL (ref 80.0–100.0)
Platelets: 181 10*3/uL (ref 150–400)
RBC: 3.2 MIL/uL — ABNORMAL LOW (ref 3.87–5.11)
RDW: 13.9 % (ref 11.5–15.5)
WBC: 3.8 10*3/uL — ABNORMAL LOW (ref 4.0–10.5)
nRBC: 0 % (ref 0.0–0.2)

## 2020-02-12 LAB — BASIC METABOLIC PANEL
Anion gap: 9 (ref 5–15)
BUN: 32 mg/dL — ABNORMAL HIGH (ref 8–23)
CO2: 27 mmol/L (ref 22–32)
Calcium: 9 mg/dL (ref 8.9–10.3)
Chloride: 98 mmol/L (ref 98–111)
Creatinine, Ser: 2 mg/dL — ABNORMAL HIGH (ref 0.44–1.00)
GFR calc Af Amer: 25 mL/min — ABNORMAL LOW (ref >60)
GFR calc non Af Amer: 21 mL/min — ABNORMAL LOW (ref >60)
Glucose, Bld: 94 mg/dL (ref 70–99)
Potassium: 4.7 mmol/L (ref 3.5–5.1)
Sodium: 134 mmol/L — ABNORMAL LOW (ref 135–145)

## 2020-02-12 LAB — URINALYSIS, COMPLETE (UACMP) WITH MICROSCOPIC
Bacteria, UA: NONE SEEN
Bilirubin Urine: NEGATIVE
Glucose, UA: NEGATIVE mg/dL
Hgb urine dipstick: NEGATIVE
Ketones, ur: NEGATIVE mg/dL
Leukocytes,Ua: NEGATIVE
Nitrite: NEGATIVE
Protein, ur: NEGATIVE mg/dL
Specific Gravity, Urine: 1.012 (ref 1.005–1.030)
pH: 7 (ref 5.0–8.0)

## 2020-02-12 LAB — SARS CORONAVIRUS 2 BY RT PCR (HOSPITAL ORDER, PERFORMED IN ~~LOC~~ HOSPITAL LAB): SARS Coronavirus 2: NEGATIVE

## 2020-02-12 LAB — STREP PNEUMONIAE URINARY ANTIGEN: Strep Pneumo Urinary Antigen: NEGATIVE

## 2020-02-12 LAB — BRAIN NATRIURETIC PEPTIDE: B Natriuretic Peptide: 313 pg/mL — ABNORMAL HIGH (ref 0.0–100.0)

## 2020-02-12 LAB — HIV ANTIBODY (ROUTINE TESTING W REFLEX): HIV Screen 4th Generation wRfx: NONREACTIVE

## 2020-02-12 LAB — TROPONIN I (HIGH SENSITIVITY): Troponin I (High Sensitivity): 54 ng/L — ABNORMAL HIGH (ref <18)

## 2020-02-12 MED ORDER — SODIUM CHLORIDE 0.9 % IV SOLN: 500.0000 mg | Freq: Once | INTRAVENOUS | Status: DC

## 2020-02-12 MED ORDER — SODIUM CHLORIDE 0.9 % IV SOLN
2.0000 g | INTRAVENOUS | Status: DC
  Administered 2020-02-12 – 2020-02-14 (×3): 2 g via INTRAVENOUS
  Filled 2020-02-12 (×3): qty 20

## 2020-02-12 MED ORDER — IPRATROPIUM-ALBUTEROL 0.5-2.5 (3) MG/3ML IN SOLN: 3.0000 mL | Freq: Four times a day (QID) | RESPIRATORY_TRACT | Status: DC | PRN

## 2020-02-12 MED ORDER — SODIUM CHLORIDE 0.9 % IV SOLN
500.0000 mg | INTRAVENOUS | Status: DC
  Administered 2020-02-12 – 2020-02-14 (×3): 500 mg via INTRAVENOUS
  Filled 2020-02-12 (×4): qty 500

## 2020-02-12 MED ORDER — SODIUM CHLORIDE 0.9 % IV SOLN: 1.0000 g | Freq: Once | INTRAVENOUS | Status: DC

## 2020-02-12 MED ORDER — UMECLIDINIUM BROMIDE 62.5 MCG/INH IN AEPB
1.0000 | INHALATION_SPRAY | Freq: Every day | RESPIRATORY_TRACT | Status: DC
  Administered 2020-02-13 – 2020-02-14 (×2): 1 via RESPIRATORY_TRACT
  Filled 2020-02-12: qty 7

## 2020-02-12 MED ORDER — PANTOPRAZOLE SODIUM 40 MG PO TBEC
40.0000 mg | DELAYED_RELEASE_TABLET | Freq: Two times a day (BID) | ORAL | Status: DC
  Administered 2020-02-12 – 2020-02-14 (×4): 40 mg via ORAL
  Filled 2020-02-12 (×4): qty 1

## 2020-02-12 MED ORDER — LEVOTHYROXINE SODIUM 25 MCG PO TABS
25.0000 ug | ORAL_TABLET | Freq: Every day | ORAL | Status: DC
  Administered 2020-02-13 – 2020-02-14 (×2): 25 ug via ORAL
  Filled 2020-02-12 (×3): qty 1

## 2020-02-12 MED ORDER — MEMANTINE HCL 10 MG PO TABS
10.0000 mg | ORAL_TABLET | Freq: Two times a day (BID) | ORAL | Status: DC
  Administered 2020-02-12 – 2020-02-14 (×5): 10 mg via ORAL
  Filled 2020-02-12 (×5): qty 1

## 2020-02-12 MED ORDER — HEPARIN SODIUM (PORCINE) 5000 UNIT/ML IJ SOLN
5000.0000 [IU] | Freq: Three times a day (TID) | INTRAMUSCULAR | Status: DC
  Administered 2020-02-12 – 2020-02-13 (×5): 5000 [IU] via SUBCUTANEOUS
  Filled 2020-02-12 (×5): qty 1

## 2020-02-12 MED ORDER — ARFORMOTEROL TARTRATE 15 MCG/2ML IN NEBU
15.0000 ug | INHALATION_SOLUTION | Freq: Two times a day (BID) | RESPIRATORY_TRACT | Status: DC
  Administered 2020-02-12 – 2020-02-14 (×5): 15 ug via RESPIRATORY_TRACT
  Filled 2020-02-12 (×5): qty 2

## 2020-02-12 MED ORDER — ADULT MULTIVITAMIN W/MINERALS CH
1.0000 | ORAL_TABLET | Freq: Every day | ORAL | Status: DC
  Administered 2020-02-12 – 2020-02-14 (×3): 1 via ORAL
  Filled 2020-02-12 (×3): qty 1

## 2020-02-12 MED ORDER — METOPROLOL TARTRATE 25 MG PO TABS
25.0000 mg | ORAL_TABLET | Freq: Two times a day (BID) | ORAL | Status: DC
  Administered 2020-02-12 – 2020-02-14 (×5): 25 mg via ORAL
  Filled 2020-02-12 (×5): qty 1

## 2020-02-12 MED ORDER — BUDESON-GLYCOPYRROL-FORMOTEROL 160-9-4.8 MCG/ACT IN AERO: 2.0000 | INHALATION_SPRAY | Freq: Two times a day (BID) | RESPIRATORY_TRACT | Status: DC

## 2020-02-12 MED ORDER — BUDESONIDE 0.25 MG/2ML IN SUSP
0.2500 mg | Freq: Two times a day (BID) | RESPIRATORY_TRACT | Status: DC
  Administered 2020-02-12 – 2020-02-14 (×5): 0.25 mg via RESPIRATORY_TRACT
  Filled 2020-02-12 (×5): qty 2

## 2020-02-12 MED ORDER — SODIUM CHLORIDE 0.9 % IV SOLN: INTRAVENOUS | Status: AC

## 2020-02-12 MED ORDER — ACETAMINOPHEN 325 MG PO TABS: 650.0000 mg | ORAL_TABLET | Freq: Four times a day (QID) | ORAL | Status: DC | PRN

## 2020-02-12 MED ORDER — FAMOTIDINE 20 MG PO TABS
20.0000 mg | ORAL_TABLET | Freq: Every day | ORAL | Status: DC
  Administered 2020-02-12 – 2020-02-13 (×2): 20 mg via ORAL
  Filled 2020-02-12 (×2): qty 1

## 2020-02-12 NOTE — ED Notes (Signed)
ED TO INPATIENT HANDOFF REPORT  ED Nurse Name and Phone #:   S Name/Age/Gender Crystal Cooper 86 y.o. female Room/Bed: APA18/APA18  Code Status   Code Status: Full Code  Home/SNF/Other Home Patient oriented to: self, place, time and situation Is this baseline? Yes   Triage Complete: Triage complete  Chief Complaint CAP (community acquired pneumonia) [J18.9]  Triage Note Pt reports SOB that started 2 days ago. Denies fevers.     Allergies Allergies  Allergen Reactions  . Hctz [Hydrochlorothiazide] Other (See Comments)    Pt was ill and this affected her kidneys   . Aspirin Other (See Comments)    Cardiologist said the patient is to not take this  . Codeine Other (See Comments)    Made the patient feel ill, has not had any problems since 1977    Level of Care/Admitting Diagnosis ED Disposition    ED Disposition Condition [place]: [facility] [[id number]]  Level of Care: Med-Surg [16]  Covid Evaluation: Confirmed COVID Negative  Diagnosis: CAP (community acquired pneumonia) [751700]  Admitting Physician: Barton Dubois [[id number]]  Attending Physician: Barton Dubois [[id number]]  Estimated length of stay: past midnight tomorrow  Certification:: I certify this patient will need inpatient services for at least 2 midnights       B Medical/Surgery History Past Medical History:  Diagnosis Date  . AKI (acute kidney injury) ([place])   . Anemia    years ago  . Aortic stenosis   . Arthritis   . Asthma   . Atrial fibrillation ([place])   . CHF (congestive heart failure) ([place]) 11/2014  . CKD (chronic kidney disease)   . Dementia ([place])   . Family history of adverse reaction to anesthesia    2 daughters would have N/V  . Gastric AVM   . GI bleed   . Glaucoma   . Hearing loss   . Heart murmur   . HTN (hypertension)   . Hypokalemia   . Pneumonia   . Prolapsing mitral leaflet syndrome   . Shortness of breath dyspnea    with exertion  . Stroke  ([place])   . SVT (supraventricular tachycardia) ([facility])    S/P ablation of AVNRT in 2003   Past Surgical History:  Procedure Laterality Date  . APPENDECTOMY    . BIOPSY  04/07/2018   Procedure: BIOPSY;  Surgeon: Jerene Bears, MD;  Location: [facility] ENDOSCOPY;  Service: Gastroenterology;;  . BLADDER SURGERY    . CARDIAC CATHETERIZATION N/A 09/26/2015   Procedure: Right/Left Heart Cath and Coronary Angiography;  Surgeon: Burnell Blanks, MD;  Location: [place] CV LAB;  Service: Cardiovascular;  Laterality: N/A;  . CARDIAC SURGERY    . CARDIOVERSION N/A 03/02/2016   Procedure: CARDIOVERSION;  Surgeon: Thayer Headings, MD;  Location: [place];  Service: Cardiovascular;  Laterality: N/A;  . EP IMPLANTABLE DEVICE N/A 10/26/2015   Procedure: Pacemaker Implant;  Surgeon: Will Meredith Leeds, MD;  Location: [place] CV LAB;  Service: Cardiovascular;  Laterality: N/A;  . ESOPHAGOGASTRODUODENOSCOPY (EGD) WITH PROPOFOL N/A 04/07/2018   Procedure: ESOPHAGOGASTRODUODENOSCOPY (EGD) WITH PROPOFOL;  Surgeon: Jerene Bears, MD;  Location: [facility] ENDOSCOPY;  Service: Gastroenterology;  Laterality: N/A;  . ESOPHAGOGASTRODUODENOSCOPY (EGD) WITH PROPOFOL N/A 10/12/2019   Procedure: ESOPHAGOGASTRODUODENOSCOPY (EGD) WITH PROPOFOL;  Surgeon: Danie Binder, MD;  [facility];  Service: Endoscopy;  Laterality: N/A;  . EYE SURGERY Bilateral    cataract surgery  . HOT HEMOSTASIS N/A 04/07/2018  Procedure: HOT HEMOSTASIS (ARGON PLASMA COAGULATION/BICAP);  Surgeon: Jerene Bears, MD;  Location: [facility] ENDOSCOPY;  Service: Gastroenterology;  Laterality: N/A;  . HOT HEMOSTASIS  10/12/2019   Procedure: HOT HEMOSTASIS (ARGON PLASMA COAGULATION/BICAP);  Surgeon: Danie Binder, MD;  [facility];  Service: Endoscopy;;  . NASAL SINUS SURGERY    . PACEMAKER INSERTION    . TEE WITHOUT CARDIOVERSION N/A 10/25/2015   Procedure: TRANSESOPHAGEAL ECHOCARDIOGRAM (TEE);  Surgeon: Burnell Blanks, MD;   Location: [place];  Service: Open Heart Surgery;  Laterality: N/A;  . TRANSCATHETER AORTIC VALVE REPLACEMENT, TRANSFEMORAL Right 10/25/2015   Procedure: TRANSCATHETER AORTIC VALVE REPLACEMENT, TRANSFEMORAL;  Surgeon: Burnell Blanks, MD;  Location: [place];  Service: Open Heart Surgery;  Laterality: Right;     A IV Location/Drains/Wounds Patient Lines/Drains/Airways Status    Active Line/Drains/Airways    Name Placement date Placement time Site Days   Peripheral IV 02/12/20 Right Forearm 02/12/20  1121  Forearm  less than 1          Intake/Output Last 24 hours  Intake/Output Summary (Last 24 hours) at 02/12/2020 1559 Last data filed at 02/12/2020 1352 Gross per 24 hour  Intake 100 ml  Output --  Net 100 ml    Labs/Imaging Results for orders placed or performed during the hospital encounter of 02/12/20 (from the past 48 hour(s))  Basic metabolic panel     Status: Abnormal   Collection Time: 02/12/20  7:52 AM  Result Value Ref Range   Sodium 134 (L) 135 - 145 mmol/L   Potassium 4.7 3.5 - 5.1 mmol/L   Chloride 98 98 - 111 mmol/L   CO2 27 22 - 32 mmol/L   Glucose, Bld 94 70 - 99 mg/dL    Comment: Glucose reference range applies only to samples taken after fasting for at least 8 hours.   BUN 32 (H) 8 - 23 mg/dL   Creatinine, Ser 2.00 (H) 0.44 - 1.00 mg/dL   Calcium 9.0 8.9 - 10.3 mg/dL   GFR calc non Af Amer 21 (L) >60 mL/min   GFR calc Af Amer 25 (L) >60 mL/min   Anion gap 9 5 - 15    Comment: Performed at [facility], [address] [place], [place] [address]  CBC     Status: Abnormal   Collection Time: 02/12/20  7:52 AM  Result Value Ref Range   WBC 3.8 (L) 4.0 - 10.5 K/uL   RBC 3.20 (L) 3.87 - 5.11 MIL/uL   Hemoglobin 9.9 (L) 12.0 - 15.0 g/dL   HCT 31.7 (L) 36 - 46 %   MCV 99.1 80.0 - 100.0 fL   MCH 30.9 26.0 - 34.0 pg   MCHC 31.2 30.0 - 36.0 g/dL   RDW 13.9 11.5 - 15.5 %   Platelets 181 150 - 400 K/uL   nRBC 0.0 0.0 - 0.2 %    Comment: Performed at [facility], [address], [place], [place] [address]  Brain natriuretic peptide     Status: Abnormal   Collection Time: 02/12/20  7:52 AM  Result Value Ref Range   B Natriuretic Peptide 313.0 (H) 0.0 - 100.0 pg/mL    Comment: Performed at [facility], [address], [place], [place] [address]  Troponin I (High Sensitivity)     Status: Abnormal   Collection Time: 02/12/20  7:52 AM  Result Value Ref Range   Troponin I (High Sensitivity) 54 (H) <18 ng/L    Comment: (NOTE) Elevated  high sensitivity troponin I (hsTnI) values and significant  changes across serial measurements may suggest ACS but many other  chronic and acute conditions are known to elevate hsTnI results.  Refer to the "Links" section for chest pain algorithms and additional  guidance. Performed at [facility], [address], [place], [place] [address]   SARS Coronavirus 2 by RT PCR (hospital order, performed in [facility] hospital lab) Nasopharyngeal Nasopharyngeal Swab     Status: None   Collection Time: 02/12/20  7:57 AM   Specimen: Nasopharyngeal Swab  Result Value Ref Range   SARS Coronavirus 2 NEGATIVE NEGATIVE    Comment: (NOTE) SARS-CoV-2 target nucleic acids are NOT DETECTED.  The SARS-CoV-2 RNA is generally detectable in upper and lower respiratory specimens during the acute phase of infection. The lowest concentration of SARS-CoV-2 viral copies this assay can detect is 250 copies / mL. A negative result does not preclude SARS-CoV-2 infection and should not be used as the sole basis for treatment or other patient management decisions.  A negative result may occur with improper specimen collection / handling, submission of specimen other than nasopharyngeal swab, presence of viral mutation(s) within the areas targeted by this assay, and inadequate number of viral copies (<250 copies / mL). A negative result must be combined with clinical observations, patient history, and epidemiological information.  Fact Sheet  for Patients:   StrictlyIdeas.no  Fact Sheet for Healthcare Providers: BankingDealers.co.za  This test is not yet approved or  cleared by the [place] FDA and has been authorized for detection and/or diagnosis of SARS-CoV-2 by FDA under an Emergency Use Authorization (EUA).  This EUA will remain in effect (meaning this test can be used) for the duration of the COVID-19 declaration under Section 564(b)(1) of the Act, 21 U.S.C. section 360bbb-3(b)(1), unless the authorization is terminated or revoked sooner.  Performed at [facility], [address] [place], [place] [address]   Culture, blood (routine x 2) Call MD if unable to obtain prior to antibiotics being given     Status: None (Preliminary result)   Collection Time: 02/12/20 11:03 AM   Specimen: Right Antecubital; Blood  Result Value Ref Range   Specimen Description RIGHT ANTECUBITAL    Special Requests      BOTTLES DRAWN AEROBIC AND ANAEROBIC Blood Culture adequate volume Performed at [facility], [address] [place], [place] [address]    Culture PENDING    Report Status PENDING   Culture, blood (routine x 2) Call MD if unable to obtain prior to antibiotics being given     Status: None (Preliminary result)   Collection Time: 02/12/20 11:04 AM   Specimen: BLOOD RIGHT WRIST  Result Value Ref Range   Specimen Description BLOOD RIGHT WRIST    Special Requests      BOTTLES DRAWN AEROBIC AND ANAEROBIC Blood Culture adequate volume Performed at [facility], [address], [place], [place] [address]    Culture PENDING    Report Status PENDING   Urinalysis, Complete w Microscopic Urine, Random     Status: None   Collection Time: 02/12/20  1:53 PM  Result Value Ref Range   Color, Urine YELLOW YELLOW   APPearance CLEAR CLEAR   Specific Gravity, Urine 1.012 1.005 - 1.030   pH 7.0 5.0 - 8.0   Glucose, UA NEGATIVE NEGATIVE mg/dL   Hgb urine dipstick NEGATIVE NEGATIVE    Bilirubin Urine NEGATIVE NEGATIVE   Ketones, ur NEGATIVE NEGATIVE mg/dL   Protein, ur NEGATIVE NEGATIVE  mg/dL   Nitrite NEGATIVE NEGATIVE   Leukocytes,Ua NEGATIVE NEGATIVE   RBC / HPF 0-5 0 - 5 RBC/hpf   WBC, UA 0-5 0 - 5 WBC/hpf   Bacteria, UA NONE SEEN NONE SEEN   Squamous Epithelial / LPF 0-5 0 - 5    Comment: Performed at [facility], [address] [place], [place] [address]   DG Chest Port 1 View  Result Date: 02/12/2020 CLINICAL DATA:  Shortness of breath. EXAM: PORTABLE CHEST 1 VIEW COMPARISON:  12/13/2019.  10/19/2019. FINDINGS: Cardiac pacer stable position. Prior cardiac valve replacement. Stable cardiomegaly. No pulmonary venous congestion. Bilateral upper lobe pulmonary infiltrates noted on today's exam. No pleural effusion or pneumothorax. IMPRESSION: 1. Cardiac pacer stable position. Prior cardiac valve replacement. Stable cardiomegaly. No pulmonary venous congestion. 2. Bilateral upper lobe pulmonary infiltrates noted on today's exam. Electronically Signed   By: [place]   On: 02/12/2020 07:21    Pending Labs Unresulted Labs (From admission, onward)          Start     Ordered   02/13/20 3903  Basic metabolic panel  Tomorrow morning,   R        02/12/20 1042   02/13/20 0500  CBC  Tomorrow morning,   R        02/12/20 1042   02/12/20 1040  Legionella Pneumophila Serogp 1 Ur Ag  Once,   STAT        02/12/20 1042   02/12/20 1040  Strep pneumoniae urinary antigen  Once,   STAT        02/12/20 1042   02/12/20 1040  Urine culture  Once,   STAT        02/12/20 1042   02/12/20 1039  Culture, sputum-assessment  Once,   STAT        02/12/20 1042   02/12/20 1036  HIV Antibody (routine testing w rflx)  (HIV Antibody (Routine testing w reflex) panel)  Once,   STAT        02/12/20 1042          Vitals/Pain Today's Vitals   02/12/20 1400 02/12/20 1430 02/12/20 1500 02/12/20 1530  BP: 105/77 104/60 120/62 107/60  Pulse: 62 (!) 58 61   Resp:  12 19   Temp:       TempSrc:      SpO2: 94% 98% 100%   Weight:      Height:      PainSc:        Isolation Precautions No active isolations  Medications Medications  heparin injection 5,000 Units (5,000 Units Subcutaneous Given 02/12/20 1457)  0.9 %  sodium chloride infusion ( Intravenous New Bag/Given 02/12/20 1126)  cefTRIAXone (ROCEPHIN) 2 g in sodium chloride 0.9 % 100 mL IVPB (0 g Intravenous Stopped 02/12/20 1352)  azithromycin (ZITHROMAX) 500 mg in sodium chloride 0.9 % 250 mL IVPB (500 mg Intravenous New Bag/Given 02/12/20 1305)  ipratropium-albuterol (DUONEB) 0.5-2.5 (3) MG/3ML nebulizer solution 3 mL (has no administration in time range)  levothyroxine (SYNTHROID) tablet 25 mcg (25 mcg Oral Not Given 02/12/20 1220)  memantine (NAMENDA) tablet 10 mg (10 mg Oral Given 02/12/20 1219)  famotidine (PEPCID) tablet 20 mg (has no administration in time range)  acetaminophen (TYLENOL) tablet 650 mg (has no administration in time range)  multivitamin with minerals tablet 1 tablet (1 tablet Oral Given 02/12/20 1219)  metoprolol tartrate (LOPRESSOR) tablet 25 mg (25 mg Oral Given 02/12/20 1220)  pantoprazole (PROTONIX) EC tablet 40 mg (  40 mg Oral Given 02/12/20 1219)  budesonide (PULMICORT) nebulizer solution 0.25 mg (0.25 mg Nebulization Given 02/12/20 1115)  umeclidinium bromide (INCRUSE ELLIPTA) 62.5 MCG/INH 1 puff (1 puff Inhalation Not Given 02/12/20 1212)  arformoterol (BROVANA) nebulizer solution 15 mcg (15 mcg Nebulization Given 02/12/20 1115)    Mobility walks Moderate fall risk   Focused Assessments    R Recommendations: See Admitting Provider Note  Report given to:   Additional Notes:

## 2020-02-12 NOTE — ED Triage Notes (Signed)
Pt reports SOB that started 2 days ago. Denies fevers.

## 2020-02-12 NOTE — ED Provider Notes (Signed)
[facility] EMERGENCY DEPARTMENT Provider Note   CSN: [id number] Arrival date & time: 02/12/20  3299     History Chief Complaint  Patient presents with  . Shortness of Breath    Crystal Cooper is a 86 y.o. female.  HPI Level 5 caveat due to dementia. Has had shortness of breath and increasing fatigue over the last few days.  Some mild confusion.  History comes primarily from patient's daughter.  Has had a cough with minimal sputum production.  No chest pain.  Has had Covid vaccinations.    Past Medical History:  Diagnosis Date  . AKI (acute kidney injury) ([place])   . Anemia    years ago  . Aortic stenosis   . Arthritis   . Asthma   . Atrial fibrillation ([place])   . CHF (congestive heart failure) ([place]) 11/2014  . CKD (chronic kidney disease)   . Dementia ([place])   . Family history of adverse reaction to anesthesia    2 daughters would have N/V  . Gastric AVM   . GI bleed   . Glaucoma   . Hearing loss   . Heart murmur   . HTN (hypertension)   . Hypokalemia   . Pneumonia   . Prolapsing mitral leaflet syndrome   . Shortness of breath dyspnea    with exertion  . Stroke ([place])   . SVT (supraventricular tachycardia) ([facility])    S/P ablation of AVNRT in 2003    Patient Active Problem List   Diagnosis Date Noted  . Bilateral impacted cerumen 11/27/2019  . Long term (current) use of anticoagulants   . AKI (acute kidney injury) ([place])   . Wheezing 09/23/2019  . Abnormal breath sounds 09/09/2019  . Heme positive stool   . Acute CVA (cerebrovascular accident) ([place]) 08/27/2019  . PNA (pneumonia) 08/25/2019  . Hypokalemia 08/23/2019  . Angio-edema, initial encounter 08/23/2019  . Reactive airway disease 03/24/2019  . GERD without esophagitis 03/24/2019  . CKD (chronic kidney disease), stage III 11/12/2018  . GI bleed 05/12/2018  . 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 Diastolic dysfunction 24/26/8341  . Pacemaker 05/12/2018  . Iron deficiency  anemia   . Melena   . Duodenal arteriovenous malformation   . Gastritis and gastroduodenitis   . Normocytic anemia 04/06/2018  . Osteoporosis 08/27/2016  . ASCVD (arteriosclerotic cardiovascular disease) 01/11/2016  . Asthma 01/11/2016  . Glaucoma 01/11/2016  . Hearing loss 01/11/2016  . Other nonrheumatic mitral valve disorders 01/11/2016  . Junctional bradycardia   . PAF (paroxysmal atrial fibrillation) ([place]) 09/06/2015  . Hyponatremia 12/17/2014  . Congestive heart failure ([place]) 12/12/2014  . Murmur 10/19/2011  . Chest pain 10/19/2011  . Essential hypertension 10/19/2011  . History of cardiac radiofrequency ablation (RFA) 10/19/2011    Past Surgical History:  Procedure Laterality Date  . APPENDECTOMY    . BIOPSY  04/07/2018   Procedure: BIOPSY;  Surgeon: Jerene Bears, MD;  Location: [facility] ENDOSCOPY;  Service: Gastroenterology;;  . BLADDER SURGERY    . CARDIAC CATHETERIZATION N/A 09/26/2015   Procedure: Right/Left Heart Cath and Coronary Angiography;  Surgeon: Burnell Blanks, MD;  Location: [place] CV LAB;  Service: Cardiovascular;  Laterality: N/A;  . CARDIAC SURGERY    . CARDIOVERSION N/A 03/02/2016   Procedure: CARDIOVERSION;  Surgeon: Thayer Headings, MD;  Location: [place];  Service: Cardiovascular;  Laterality: N/A;  . EP IMPLANTABLE DEVICE N/A 10/26/2015   Procedure: Pacemaker Implant;  Surgeon: Will Meredith Leeds, MD;  Location: [place] CV LAB;  Service: Cardiovascular;  Laterality: N/A;  . ESOPHAGOGASTRODUODENOSCOPY (EGD) WITH PROPOFOL N/A 04/07/2018   Procedure: ESOPHAGOGASTRODUODENOSCOPY (EGD) WITH PROPOFOL;  Surgeon: Jerene Bears, MD;  Location[facility] ENDOSCOPY;  Service: Gastroenterology;  Laterality: N/A;  . ESOPHAGOGASTRODUODENOSCOPY (EGD) WITH PROPOFOL N/A 10/12/2019   Procedure: ESOPHAGOGASTRODUODENOSCOPY (EGD) WITH PROPOFOL;  Surgeon: Danie Binder, MD;  [facility];  Service: Endoscopy;  Laterality: N/A;  . EYE SURGERY Bilateral     cataract surgery  . HOT HEMOSTASIS N/A 04/07/2018   Procedure: HOT HEMOSTASIS (ARGON PLASMA COAGULATION/BICAP);  Surgeon: Jerene Bears, MD;  Location: [facility] ENDOSCOPY;  Service: Gastroenterology;  Laterality: N/A;  . HOT HEMOSTASIS  10/12/2019   Procedure: HOT HEMOSTASIS (ARGON PLASMA COAGULATION/BICAP);  Surgeon: Danie Binder, MD;  [facility] ENDO [facility];  Service: Endoscopy;;  . NASAL SINUS SURGERY    . PACEMAKER INSERTION    . TEE WITHOUT CARDIOVERSION N/A 10/25/2015   Procedure: TRANSESOPHAGEAL ECHOCARDIOGRAM (TEE);  Surgeon: Burnell Blanks, MD;  Location: [place];  Service: Open Heart Surgery;  Laterality: N/A;  . TRANSCATHETER AORTIC VALVE REPLACEMENT, TRANSFEMORAL Right 10/25/2015   Procedure: TRANSCATHETER AORTIC VALVE REPLACEMENT, TRANSFEMORAL;  Surgeon: Burnell Blanks, MD;  Location: [place];  Service: Open Heart Surgery;  Laterality: Right;     OB History    Gravida  3   Para  3   Term  3   Preterm      AB      Living  2     SAB      TAB      Ectopic      Multiple      Live Births              Family History  Problem Relation Age of Onset  . Hypertension Mother   . Pneumonia Father   . Stomach cancer Brother   . Stroke Brother   . AAA (abdominal aortic aneurysm) Brother   . Cervical cancer Daughter   . Heart attack Neg Hx   . Colon cancer Neg Hx   . Esophageal cancer Neg Hx     Social History   Tobacco Use  . Smoking status: Never Smoker  . Smokeless tobacco: Never Used  Vaping Use  . Vaping Use: Never used  Substance Use Topics  . Alcohol use: No    Alcohol/week: 0.0 standard drinks  . Drug use: No    Home Medications Prior to Admission medications   Medication Sig Start Date End Date Taking? Authorizing Provider  acetaminophen (TYLENOL) 500 MG tablet Take 500 mg by mouth every 6 (six) hours as needed for mild pain or headache.     [provider]  albuterol (PROAIR HFA) 108 (90 Base) MCG/ACT inhaler Inhale 2 puffs  into the lungs every 4 (four) hours as needed for wheezing or shortness of breath. 08/05/19   Baruch Gouty, FNP  Biotin 10 MG CAPS Take 10 mg by mouth daily.     [provider]  Budeson-Glycopyrrol-Formoterol (BREZTRI AEROSPHERE) 160-9-4.8 MCG/ACT AERO Inhale 2 Inhalers into the lungs in the morning and at bedtime. 11/27/19   Ivy Lynn, NP  Calcium Carb-Cholecalciferol (CALTRATE 600+D3) 600-800 MG-UNIT TABS Take 1 tablet by mouth daily.    [provider]  famotidine (PEPCID) 20 MG tablet Take 1 tablet (20 mg total) by mouth at bedtime. 12/21/19 12/20/20  [name], MD  ferrous sulfate 325 (65 FE) MG tablet Take 1 tablet (  325 mg total) by mouth 2 (two) times daily with a meal. 04/08/18   Eulogio Bear U, DO  furosemide (LASIX) 20 MG tablet Take 3 tablets (60 mg total) by mouth daily. 12/15/19   Lendon Colonel, NP  levothyroxine (SYNTHROID) 25 MCG tablet Take 1 tablet (25 mcg total) by mouth daily. 12/02/19   [name], MD  memantine (NAMENDA) 10 MG tablet Take 1 tablet (10 mg total) by mouth 2 (two) times daily. 01/04/20   Garvin Fila, MD  metoprolol tartrate (LOPRESSOR) 25 MG tablet Take 1 tablet (25 mg total) by mouth 2 (two) times daily. 05/20/19   Camnitz, Ocie Doyne, MD  multivitamin-iron-minerals-folic acid (CENTRUM) chewable tablet Chew 1 tablet by mouth daily.    [provider]  OVER THE COUNTER MEDICATION Take 0.5 tablets by mouth daily as needed (for allergy). Patient not sure about the name.    [provider]  pantoprazole (PROTONIX) 40 MG tablet TAKE 1 TABLET BY MOUTH TWICE DAILY BEFORE MEAL(S) 01/01/20   [name], MD  potassium chloride SA (KLOR-CON) 10 MEQ tablet Take 2 tablets (20 mEq total) by mouth 2 (two) times daily. 02/09/20   [name], MD  sulfamethoxazole-trimethoprim (BACTRIM DS) 800-160 MG tablet Take 1 tablet by mouth 2 (two) times daily. 02/09/20   [name], MD    Allergies    Hctz  [hydrochlorothiazide], Aspirin, and Codeine  Review of Systems   Review of Systems  Unable to perform ROS: Dementia    Physical Exam Updated Vital Signs BP (!) 143/61 (BP Location: Right Arm)   Pulse 60   Temp 97.9 F (36.6 C) (Oral)   Resp 20   Ht 5\' 2"  (1.575 m)   Wt 51.7 kg   SpO2 95%   BMI 20.85 kg/m   Physical Exam Vitals reviewed.  Constitutional:      Comments: Sitting in bed.  Some confusion but will answer questions.  Also somewhat difficulty hearing.  HENT:     Head: Normocephalic.  Cardiovascular:     Rate and Rhythm: Normal rate.  Pulmonary:     Effort: Tachypnea present.     Comments: Mildly harsh breath sounds.  Few scattered wheezes also. Chest:     Chest wall: No tenderness.  Abdominal:     Tenderness: There is no abdominal tenderness.  Musculoskeletal:     Right lower leg: No edema.     Left lower leg: No edema.  Skin:    General: Skin is warm.     Capillary Refill: Capillary refill takes less than 2 seconds.  Neurological:     Comments: Awake and pleasant, but some confusion.     ED Results / Procedures / Treatments   Labs (all labs ordered are listed, but only abnormal results are displayed) Labs Reviewed  BASIC METABOLIC PANEL - Abnormal; Notable for the following components:      Result Value   Sodium 134 (*)    BUN 32 (*)    Creatinine, Ser 2.00 (*)    GFR calc non Af Amer 21 (*)    GFR calc Af Amer 25 (*)    All other components within normal limits  CBC - Abnormal; Notable for the following components:   WBC 3.8 (*)    RBC 3.20 (*)    Hemoglobin 9.9 (*)    HCT 31.7 (*)    All other components within normal limits  BRAIN NATRIURETIC PEPTIDE - Abnormal; Notable for the following components:   B Natriuretic  Peptide 313.0 (*)    All other components within normal limits  TROPONIN I (HIGH SENSITIVITY) - Abnormal; Notable for the following components:   Troponin I (High Sensitivity) 54 (*)    All other components within normal  limits  SARS CORONAVIRUS 2 BY RT PCR [facility] ORDER, [place] LAB)    EKG EKG Interpretation  Date/Time:  Friday February 12 2020 06:23:37 EDT Ventricular Rate:  60 PR Interval:    QRS Duration: 166 QT Interval:  488 QTC Calculation: 488 R Axis:   -77 Text Interpretation: Ventricular-paced rhythm underlying atrial flutter Confirmed by Davonna Belling [phone]) on 02/12/2020 9:40:38 AM   Radiology DG Chest Port 1 View  Result Date: 02/12/2020 CLINICAL DATA:  Shortness of breath. EXAM: PORTABLE CHEST 1 VIEW COMPARISON:  12/13/2019.  10/19/2019. FINDINGS: Cardiac pacer stable position. Prior cardiac valve replacement. Stable cardiomegaly. No pulmonary venous congestion. Bilateral upper lobe pulmonary infiltrates noted on today's exam. No pleural effusion or pneumothorax. IMPRESSION: 1. Cardiac pacer stable position. Prior cardiac valve replacement. Stable cardiomegaly. No pulmonary venous congestion. 2. Bilateral upper lobe pulmonary infiltrates noted on today's exam. Electronically Signed   By: Marcello Moores  Register   On: 02/12/2020 07:21    Procedures Procedures (including critical care time)  Medications Ordered in ED Medications - No data to display  ED Course  I have reviewed the triage vital signs and the nursing notes.  Pertinent labs & imaging results that were available during my care of the patient were reviewed by me and considered in my medical decision making (see chart for details).    MDM Rules/Calculators/A&P                          Patient brought in for shortness of breath.  Has had mild cough.  Has had Covid vaccine.  Not hypoxic but x-ray does show bilateral infiltrates.  Does not appear to be in CHF on x-ray although BNP is mildly elevated.  With infiltrates on x-ray will treat for pneumonia.  Has had cough with mild sputum production.  Also has an acute kidney injury with creatinine going from around 1.2 up to 2.  High risk due to age and  comorbidities.  Curb 65 score of 3 and Port score of 150.  Will discuss with hospitalist.  Crystal Cooper was evaluated in Emergency Department on 02/12/2020 for the symptoms described in the history of present illness. She was evaluated in the context of the global COVID-19 pandemic, which necessitated consideration that the patient might be at risk for infection with the SARS-CoV-2 virus that causes COVID-19. Institutional protocols and algorithms that pertain to the evaluation of patients at risk for COVID-19 are in a state of rapid change based on information released by regulatory bodies including the CDC and federal and state organizations. These policies and algorithms were followed during the patient's care in the ED.  Final Clinical Impression(s) / ED Diagnoses Final diagnoses:  Community acquired pneumonia, unspecified laterality  AKI (acute kidney injury) [facility])    Rx / [place] Orders ED Discharge Orders    None       Davonna Belling, MD 02/12/20 [phone]

## 2020-02-12 NOTE — Progress Notes (Signed)
Remote pacemaker transmission.   

## 2020-02-12 NOTE — H&P (Signed)
History and Physical    [name]:[id number] DOB: 10-23-1926 DOA: 02/12/2020  PCP: Crystal Fraise, MD   Patient coming from: Home  I have personally briefly reviewed patient's old medical records in [place]  Chief Complaint: Shortness of breath and productive cough.  HPI: Crystal Cooper is a 86 y.o. female with medical history significant of supraventricular tachycardia status post ablation in 2003, chronic renal failure stage IIIb-IV, history of asthma/COPD, gastroesophageal reflux disease, dementia without behavioral disturbances, chronic diastolic heart failure and hypertension; who presented to the hospital secondary to shortness of breath and increased productive cough.  Family reported some chills but no frank fever.  Symptoms have been present for the last 2-3 days.  No sick contacts.  No hemoptysis no productive cough has been reported; shortness of breath mainly with activities and decreased appetite. Patient denies chest pain, abdominal pain, dysuria, hematuria, melena, hematochezia, focal weakness or any other complaints.  ED Course: In the ED chest x-ray demonstrated acute bilateral upper lobe pulmonary infiltrates suggesting pneumonia, acute on chronic renal failure, mild tachypnea and borderline normal oxygen saturation on room air.  Cultures taken and antibiotics started for community-acquired pneumonia.  Curb 65 calculated on more than 2 TRH has been contacted to place patient in the hospital for further evaluation and management.  Review of Systems: As per HPI otherwise all other systems reviewed and are negative.   Past Medical History:  Diagnosis Date  . AKI (acute kidney injury) ([place])   . Anemia    years ago  . Aortic stenosis   . Arthritis   . Asthma   . Atrial fibrillation ([place])   . CHF (congestive heart failure) ([place]) 11/2014  . CKD (chronic kidney disease)   . Dementia ([place])   . Family history of adverse reaction to anesthesia    2 daughters  would have N/V  . Gastric AVM   . GI bleed   . Glaucoma   . Hearing loss   . Heart murmur   . HTN (hypertension)   . Hypokalemia   . Pneumonia   . Prolapsing mitral leaflet syndrome   . Shortness of breath dyspnea    with exertion  . Stroke ([place])   . SVT (supraventricular tachycardia) ([facility])    S/P ablation of AVNRT in 2003    Past Surgical History:  Procedure Laterality Date  . APPENDECTOMY    . BIOPSY  04/07/2018   Procedure: BIOPSY;  Surgeon: Jerene Bears, MD;  Location: [facility] ENDOSCOPY;  Service: Gastroenterology;;  . BLADDER SURGERY    . CARDIAC CATHETERIZATION N/A 09/26/2015   Procedure: Right/Left Heart Cath and Coronary Angiography;  Surgeon: Burnell Blanks, MD;  Location: [facility] CV LAB;  Service: Cardiovascular;  Laterality: N/A;  . CARDIAC SURGERY    . CARDIOVERSION N/A 03/02/2016   Procedure: CARDIOVERSION;  Surgeon: Thayer Headings, MD;  Location: [place];  Service: Cardiovascular;  Laterality: N/A;  . EP IMPLANTABLE DEVICE N/A 10/26/2015   Procedure: Pacemaker Implant;  Surgeon: Will Meredith Leeds, MD;  Location: [place] CV LAB;  Service: Cardiovascular;  Laterality: N/A;  . ESOPHAGOGASTRODUODENOSCOPY (EGD) WITH PROPOFOL N/A 04/07/2018   Procedure: ESOPHAGOGASTRODUODENOSCOPY (EGD) WITH PROPOFOL;  Surgeon: Jerene Bears, MD;  Location: [facility] ENDOSCOPY;  Service: Gastroenterology;  Laterality: N/A;  . ESOPHAGOGASTRODUODENOSCOPY (EGD) WITH PROPOFOL N/A 10/12/2019   Procedure: ESOPHAGOGASTRODUODENOSCOPY (EGD) WITH PROPOFOL;  Surgeon: Danie Binder, MD;  Location[facility] ENDO SUITE;  Service: Endoscopy;  Laterality: N/A;  . EYE  SURGERY Bilateral    cataract surgery  . HOT HEMOSTASIS N/A 04/07/2018   Procedure: HOT HEMOSTASIS (ARGON PLASMA COAGULATION/BICAP);  Surgeon: Jerene Bears, MD;  Location: [facility] ENDOSCOPY;  Service: Gastroenterology;  Laterality: N/A;  . HOT HEMOSTASIS  10/12/2019   Procedure: HOT HEMOSTASIS (ARGON PLASMA COAGULATION/BICAP);  Surgeon:  Danie Binder, MD;  Location: [facility] ENDO SUITE;  Service: Endoscopy;;  . NASAL SINUS SURGERY    . PACEMAKER INSERTION    . TEE WITHOUT CARDIOVERSION N/A 10/25/2015   Procedure: TRANSESOPHAGEAL ECHOCARDIOGRAM (TEE);  Surgeon: Burnell Blanks, MD;  Location: [place];  Service: Open Heart Surgery;  Laterality: N/A;  . TRANSCATHETER AORTIC VALVE REPLACEMENT, TRANSFEMORAL Right 10/25/2015   Procedure: TRANSCATHETER AORTIC VALVE REPLACEMENT, TRANSFEMORAL;  Surgeon: Burnell Blanks, MD;  Location: [place];  Service: Open Heart Surgery;  Laterality: Right;    Social History  reports that she has never smoked. She has never used smokeless tobacco. She reports that she does not drink alcohol and does not use drugs.  Allergies  Allergen Reactions  . Hctz [Hydrochlorothiazide] Other (See Comments)    Pt was ill and this affected her kidneys   . Aspirin Other (See Comments)    Cardiologist said the patient is to not take this  . Codeine Other (See Comments)    Made the patient feel ill, has not had any problems since 1977    Family History  Problem Relation Age of Onset  . Hypertension Mother   . Pneumonia Father   . Stomach cancer Brother   . Stroke Brother   . AAA (abdominal aortic aneurysm) Brother   . Cervical cancer Daughter   . Heart attack Neg Hx   . Colon cancer Neg Hx   . Esophageal cancer Neg Hx     Prior to Admission medications   Medication Sig Start Date End Date Taking? Authorizing Provider  acetaminophen (TYLENOL) 500 MG tablet Take 500 mg by mouth every 6 (six) hours as needed for mild pain or headache.     [provider]  albuterol (PROAIR HFA) 108 (90 Base) MCG/ACT inhaler Inhale 2 puffs into the lungs every 4 (four) hours as needed for wheezing or shortness of breath. 08/05/19   Baruch Gouty, FNP  Biotin 10 MG CAPS Take 10 mg by mouth daily.     [provider]  Budeson-Glycopyrrol-Formoterol (BREZTRI AEROSPHERE) 160-9-4.8 MCG/ACT AERO Inhale 2  Inhalers into the lungs in the morning and at bedtime. 11/27/19   Ivy Lynn, NP  Calcium Carb-Cholecalciferol (CALTRATE 600+D3) 600-800 MG-UNIT TABS Take 1 tablet by mouth daily.    [provider]  famotidine (PEPCID) 20 MG tablet Take 1 tablet (20 mg total) by mouth at bedtime. 12/21/19 12/20/20  Crystal Fraise, MD  ferrous sulfate 325 (65 FE) MG tablet Take 1 tablet (325 mg total) by mouth 2 (two) times daily with a meal. 04/08/18   Eulogio Bear U, DO  furosemide (LASIX) 20 MG tablet Take 3 tablets (60 mg total) by mouth daily. 12/15/19   Lendon Colonel, NP  levothyroxine (SYNTHROID) 25 MCG tablet Take 1 tablet (25 mcg total) by mouth daily. 12/02/19   Crystal Fraise, MD  memantine (NAMENDA) 10 MG tablet Take 1 tablet (10 mg total) by mouth 2 (two) times daily. 01/04/20   Garvin Fila, MD  metoprolol tartrate (LOPRESSOR) 25 MG tablet Take 1 tablet (25 mg total) by mouth 2 (two) times daily. 05/20/19   Camnitz, Ocie Doyne, MD  multivitamin-iron-minerals-folic  acid (CENTRUM) chewable tablet Chew 1 tablet by mouth daily.    [provider]  OVER THE COUNTER MEDICATION Take 0.5 tablets by mouth daily as needed (for allergy). Patient not sure about the name.    [provider]  pantoprazole (PROTONIX) 40 MG tablet TAKE 1 TABLET BY MOUTH TWICE DAILY BEFORE MEAL(S) 01/01/20   Crystal Fraise, MD  potassium chloride SA (KLOR-CON) 10 MEQ tablet Take 2 tablets (20 mEq total) by mouth 2 (two) times daily. 02/09/20   Crystal Fraise, MD  sulfamethoxazole-trimethoprim (BACTRIM DS) 800-160 MG tablet Take 1 tablet by mouth 2 (two) times daily. 02/09/20   Crystal Fraise, MD    Physical Exam: Vitals:   02/12/20 1500 02/12/20 1530 02/12/20 1600 02/12/20 1638  BP: 120/62 107/60 (!) 122/58 (!) 155/69  Pulse: 61  (!) 59 64  Resp: 19  16 20   Temp:    98 F (36.7 C)  TempSrc:    Oral  SpO2: 100%  90% 100%  Weight:      Height:        Constitutional: Complaining of intermittent  coughing spells, mild shortness of breath.  Afebrile no chest pain, no nausea or vomiting currently. Vitals:   02/12/20 1500 02/12/20 1530 02/12/20 1600 02/12/20 1638  BP: 120/62 107/60 (!) 122/58 (!) 155/69  Pulse: 61  (!) 59 64  Resp: 19  16 20   Temp:    98 F (36.7 C)  TempSrc:    Oral  SpO2: 100%  90% 100%  Weight:      Height:       Eyes: PERRL, lids and conjunctivae normal, no icterus, no nystagmus. ENMT: Mucous membranes are moist. Posterior pharynx clear of any exudate or lesions.  Neck: normal, supple, no masses, no thyromegaly, no JVD. Respiratory: Positive rhonchi bilaterally, no wheezing, no using accessory muscle. Cardiovascular: Regular rate and rhythm, no murmurs / rubs / gallops. 2+ pedal pulses. No carotid bruits.  Abdomen: no tenderness, no masses palpated. No hepatosplenomegaly. Bowel sounds positive.  Musculoskeletal: No cyanosis or clubbing; trace edema appreciated on her legs bilaterally. Skin: no rashes, no petechiae. Neurologic: CN 2-12 grossly intact.  No focal neurologic deficits appreciated on exam.  Patient with muscle strength of 3-4 out of 5 in the setting of poor effort on chronic deconditioning. Psychiatric: Judgment and insight appears to be impaired for underlying history of dementia; oriented x2 and no signs of hallucinations or agitation currently.   Labs on Admission: I have personally reviewed following labs and imaging studies  CBC: Recent Labs  Lab 02/09/20 1442 02/12/20 0752  WBC 5.1 3.8*  NEUTROABS 3.6  --   HGB 10.5* 9.9*  HCT 32.4* 31.7*  MCV 96 99.1  PLT 197 488    Basic Metabolic Panel: Recent Labs  Lab 02/09/20 1442 02/12/20 0752  NA 137 134*  K 4.1 4.7  CL 96 98  CO2 29 27  GLUCOSE 100* 94  BUN 21 32*  CREATININE 1.26* 2.00*  CALCIUM 8.9 9.0    GFR: Estimated Creatinine Clearance: 14.5 mL/min (A) (by C-G formula based on SCr of 2 mg/dL (H)).  Liver Function Tests: Recent Labs  Lab 02/09/20 1442  AST 28  ALT  16  ALKPHOS 93  BILITOT 0.4  PROT 6.3  ALBUMIN 3.6    Urine analysis:    Component Value Date/Time   COLORURINE YELLOW 02/12/2020 1353   APPEARANCEUR CLEAR 02/12/2020 1353   APPEARANCEUR Hazy (A) 02/09/2020 1400   LABSPEC 1.012 02/12/2020 1353  PHURINE 7.0 02/12/2020 [place] 02/12/2020 [place] 02/12/2020 [place] 02/12/2020 1353   BILIRUBINUR Negative 02/09/2020 [place] 02/12/2020 1353   PROTEINUR NEGATIVE 02/12/2020 1353   NITRITE NEGATIVE 02/12/2020 [place] 02/12/2020 1353    Radiological Exams on Admission: DG Chest Port 1 View  Result Date: 02/12/2020 CLINICAL DATA:  Shortness of breath. EXAM: PORTABLE CHEST 1 VIEW COMPARISON:  12/13/2019.  10/19/2019. FINDINGS: Cardiac pacer stable position. Prior cardiac valve replacement. Stable cardiomegaly. No pulmonary venous congestion. Bilateral upper lobe pulmonary infiltrates noted on today's exam. No pleural effusion or pneumothorax. IMPRESSION: 1. Cardiac pacer stable position. Prior cardiac valve replacement. Stable cardiomegaly. No pulmonary venous congestion. 2. Bilateral upper lobe pulmonary infiltrates noted on today's exam. Electronically Signed   By: [place]   On: 02/12/2020 07:21    EKG: Independently reviewed.  Sinus rhythm, no acute ischemic changes.  Normal QT.  Assessment/Plan 1-community-acquired pneumonia -Curb 65 more than 2 -Per reports having some nausea and one episode of vomiting.  Patient is dehydrated with acute on chronic renal failure -IV antibiotics for community-acquired pneumonia using Rocephin and Zithromax has been initiated -We will provide gentle fluid resuscitation, holding nephrotoxic agents, continue nebulizer management and initiation of flutter valve and incentive spirometer -Will follow clinical response. -No requiring oxygen supplementation currently.  2-hypothyroidism -Continue  Synthroid  3-gastroesophageal flux disease -Continue PPI and famotidine  4-history of underlying dementia -Consult reorientation and supportive care -Continue Namenda  5-essential hypertension/history of nonsustained supraventricular tachycardia -Continue Lopressor -Currently in sinus rhythm with a stable rate.  6-history of COPD -No wheezing at this time -Continue home bronchodilator management.  7-acute on chronic renal failure -Stage IIIb-IV at baseline (transition stage) -Minimize nephrotoxic agents -Provide gentle hydration -Check urinalysis -Follow renal function trend.  8-history of chronic diastolic heart failure -Follow daily weights and history does not note -Appears to be compensated currently. -Heart healthy diet has been ordered.  9-anemia of chronic kidney disease -Appears to be stable at baseline -No signs of overt bleeding -Continue to follow hemoglobin trend.    DVT prophylaxis: Heparin Code Status:   Full code Family Communication:  No family at bedside. Disposition Plan:   Patient is from:  Home  Anticipated DC to:  Home with home health services.  Anticipated DC date:  02/14/20  Anticipated DC barriers: Stabilization of respiratory status and management of community-acquired pneumonia.  Consults called: None Admission status:  Inpatient, MedSurg bed, length of stay more than 2 midnights.  Severity of Illness: Inpatient, length of stay more than 2 midnights; moderate severity with high risk for decompensation given patient's age and underlying comorbidities.  Curb 65 more than 2, has been admitted for IV antibiotics.    Barton Dubois MD [facility]  How to contact the [facility] Attending or Consulting provider [place] or covering provider during after hours [place], for this patient?   1. Check the care team in [facility] and look for a) attending/consulting [facility] provider listed and b) the [facility] team listed 2. Log into [email or web address] and use [facility]'s universal password to access. If you do not have the password, please contact the hospital operator. 3. Locate the [facility] provider you are looking for under [facility] and page to a number that you can be directly reached. 4. If you still have difficulty reaching the provider, please page the [facility] (Director on Call) for the Hospitalists listed on  amion for assistance.  02/12/2020, 5:51 PM

## 2020-02-12 NOTE — ED Notes (Signed)
Lab at bedside to obtain blood sample. 

## 2020-02-13 DIAGNOSIS — N1832 Chronic kidney disease, stage 3b: Secondary | ICD-10-CM

## 2020-02-13 DIAGNOSIS — J441 Chronic obstructive pulmonary disease with (acute) exacerbation: Secondary | ICD-10-CM

## 2020-02-13 LAB — CBC
HCT: 29.4 % — ABNORMAL LOW (ref 36.0–46.0)
Hemoglobin: 9.2 g/dL — ABNORMAL LOW (ref 12.0–15.0)
MCH: 31.3 pg (ref 26.0–34.0)
MCHC: 31.3 g/dL (ref 30.0–36.0)
MCV: 100 fL (ref 80.0–100.0)
Platelets: 159 10*3/uL (ref 150–400)
RBC: 2.94 MIL/uL — ABNORMAL LOW (ref 3.87–5.11)
RDW: 14 % (ref 11.5–15.5)
WBC: 3.5 10*3/uL — ABNORMAL LOW (ref 4.0–10.5)
nRBC: 0 % (ref 0.0–0.2)

## 2020-02-13 LAB — LEGIONELLA PNEUMOPHILA SEROGP 1 UR AG: L. pneumophila Serogp 1 Ur Ag: NEGATIVE

## 2020-02-13 LAB — BASIC METABOLIC PANEL
Anion gap: 5 (ref 5–15)
BUN: 25 mg/dL — ABNORMAL HIGH (ref 8–23)
CO2: 26 mmol/L (ref 22–32)
Calcium: 8.7 mg/dL — ABNORMAL LOW (ref 8.9–10.3)
Chloride: 104 mmol/L (ref 98–111)
Creatinine, Ser: 1.66 mg/dL — ABNORMAL HIGH (ref 0.44–1.00)
GFR calc Af Amer: 31 mL/min — ABNORMAL LOW (ref >60)
GFR calc non Af Amer: 27 mL/min — ABNORMAL LOW (ref >60)
Glucose, Bld: 94 mg/dL (ref 70–99)
Potassium: 4.8 mmol/L (ref 3.5–5.1)
Sodium: 135 mmol/L (ref 135–145)

## 2020-02-13 LAB — URINE CULTURE: Culture: NO GROWTH

## 2020-02-13 MED ORDER — PREDNISONE 20 MG PO TABS
40.0000 mg | ORAL_TABLET | Freq: Every day | ORAL | Status: DC
  Administered 2020-02-13 – 2020-02-14 (×2): 40 mg via ORAL
  Filled 2020-02-13 (×2): qty 2

## 2020-02-13 MED ORDER — DM-GUAIFENESIN ER 30-600 MG PO TB12
1.0000 | ORAL_TABLET | Freq: Two times a day (BID) | ORAL | Status: DC
  Administered 2020-02-13 – 2020-02-14 (×3): 1 via ORAL
  Filled 2020-02-13 (×3): qty 1

## 2020-02-13 NOTE — Progress Notes (Signed)
PROGRESS NOTE    [name]  RSW:[id number] DOB: 04-01-1927 DOA: 02/12/2020 PCP: [name], MD    Chief Complaint  Patient presents with   Shortness of Breath    Brief Narrative:  Crystal Cooper is a 86 y.o. female with medical history significant of supraventricular tachycardia status post ablation in 2003, chronic renal failure stage IIIb-IV, history of asthma/COPD, gastroesophageal reflux disease, dementia without behavioral disturbances, chronic diastolic heart failure and hypertension; who presented to the hospital secondary to shortness of breath and increased productive cough.  Family reported some chills but no frank fever.  Symptoms have been present for the last 2-3 days.  No sick contacts.  No hemoptysis no productive cough has been reported; shortness of breath mainly with activities and decreased appetite. Patient denies chest pain, abdominal pain, dysuria, hematuria, melena, hematochezia, focal weakness or any other complaints.  ED Course: In the ED chest x-ray demonstrated acute bilateral upper lobe pulmonary infiltrates suggesting pneumonia, acute on chronic renal failure, mild tachypnea and borderline normal oxygen saturation on room air.  Cultures taken and antibiotics started for community-acquired pneumonia.  Curb 65 calculated on more than 2 TRH has been contacted to place patient in the hospital for further evaluation and management.   Assessment & Plan: 1-community-acquired pneumonia/COPD exacerbation -Continue to have shortness of breath, no requiring oxygen supplementation; short winded with minimal activity and having productive cough. -Curb 65 more than 2 on presentation. -Continue IV antibiotics for another 24 hours, start steroids, continue nebulizer management and follow clinical response.  2-hypothyroidism -Continue Synthroid.  3-GERD -Continue PPI and famotidine.  4-history of underlying dementia -Continue constant reorientation, supportive  care and continue the use of Namenda -Normal behavior appreciated during examination. -Following commands appropriately.  5-acute on chronic renal failure A stage IIIb at baseline -Continue minimizing nephrotoxic agents -Good response to fluid resuscitation -Continue to follow renal function trend. -Creatinine down to 1.6 (very close to baseline).  6-history of diastolic heart failure -Continue holding Lasix-continue heart healthy diet, daily weights and strict I's and O's. -Condition appears to be compensated currently. -Continue Lopressor  7-essential hypertension/nonsustained supraventricular tachycardia -Has remained in sinus rhythm -Continue Lopressor -Stable vital signs.  8-anemia of chronic kidney disease -No signs of overt bleeding -Continue to follow hemoglobin trend. -Hemoglobin stable.    DVT prophylaxis: Heparin Code Status: Full code Family Communication: Daughter (Ms. [name]) contacted over the phone 02/13/2020. Disposition:   Status is: Inpatient   Dispo: The patient is from: home              Anticipated d/c is to: home              Anticipated d/c date is: 02/14/20              Patient currently no medically stable for discharge, still with increased shortness of breath with activity, productive cough and wheezing.  Renal function is pretty much back to her baseline, and will continue assessing capacity to maintain adequate oral nutrition and hydration.  Will continue current IV antibiotics for another 24 hours, continue nebulizer management and start oral prednisone.    Consultants:   None   Procedures:  See below for x-ray report   Antimicrobials:  Rocephin and ceftriaxone 02/12/20   Subjective: Afebrile, no chest pain, no nausea or vomiting currently.  Patient reports some mild nausea overnight.  Reports breathing is better, but is still short of breath with activity and having increased wheezing.  Objective: Vitals:   02/12/20  2052 02/13/20  0020 02/13/20 0426 02/13/20 0748  BP: 128/61 133/74 (!) 139/54   Pulse: (!) 59 65 (!) 59   Resp: 20 20 18    Temp: 98.4 F (36.9 C) 98.2 F (36.8 C) 98.4 F (36.9 C)   TempSrc: Oral Oral Oral   SpO2: 96% 96% 96% 96%  Weight:      Height:        Intake/Output Summary (Last 24 hours) at 02/13/2020 1241 Last data filed at 02/13/2020 1014 Gross per 24 hour  Intake 915.63 ml  Output 1265 ml  Net -349.37 ml   Filed Weights   02/12/20 0622  Weight: 51.7 kg    Examination:  General exam: Appears calm and comfortable; reports he still feeling short of breath with activity and having increased productive cough.  No fever, no vomiting and expressed chills mild nausea overnight. Respiratory system: Positive expiratory wheezing, diffuse rhonchi bilaterally; no crackles.  No using accessory muscles.  No requiring oxygen supplementation. Cardiovascular system: Rate controlled, no rubs, no gallops, no JVD on exam.  No lower extremity edema. Gastrointestinal system: Abdomen is nondistended, soft and nontender. No organomegaly or masses felt. Normal bowel sounds heard. Central nervous system: Alert and oriented. No focal neurological deficits. Extremities: No cyanosis or clubbing. Skin: No rashes, no petechiae. Psychiatry: Mood & affect appropriate.    Data Reviewed: I have personally reviewed following labs and imaging studies  CBC: Recent Labs  Lab 02/09/20 1442 02/12/20 0752 02/13/20 0617  WBC 5.1 3.8* 3.5*  NEUTROABS 3.6  --   --   HGB 10.5* 9.9* 9.2*  HCT 32.4* 31.7* 29.4*  MCV 96 99.1 100.0  PLT 197 181 751    Basic Metabolic Panel: Recent Labs  Lab 02/09/20 1442 02/12/20 0752 02/13/20 0617  NA 137 134* 135  K 4.1 4.7 4.8  CL 96 98 104  CO2 29 27 26   GLUCOSE 100* 94 94  BUN 21 32* 25*  CREATININE 1.26* 2.00* 1.66*  CALCIUM 8.9 9.0 8.7*    GFR: Estimated Creatinine Clearance: 17.5 mL/min (A) (by C-G formula based on SCr of 1.66 mg/dL (H)).  Liver Function  Tests: Recent Labs  Lab 02/09/20 1442  AST 28  ALT 16  ALKPHOS 93  BILITOT 0.4  PROT 6.3  ALBUMIN 3.6    CBG: No results for input(s): GLUCAP in the last 168 hours.   Recent Results (from the past 240 hour(s))  Urine Culture     Status: Abnormal   Collection Time: 02/09/20  2:00 PM   Specimen: Urine   UR  Result Value Ref Range Status   Urine Culture, Routine Final report (A)  Final   Organism ID, Bacteria Escherichia coli (A)  Final    Comment: Cefazolin <=4 ug/mL Cefazolin with an MIC <=16 predicts susceptibility to the oral agents cefaclor, cefdinir, cefpodoxime, cefprozil, cefuroxime, cephalexin, and loracarbef when used for therapy of uncomplicated urinary tract infections due to E. coli, Klebsiella pneumoniae, and Proteus mirabilis. Greater than 100,000 colony forming units per mL    Antimicrobial Susceptibility Comment  Final    Comment:       ** S = Susceptible; I = Intermediate; R = Resistant **                    P = Positive; N = Negative             MICS are expressed in micrograms per mL    Antibiotic  RSLT#1    RSLT#2    RSLT#3    RSLT#4 Amoxicillin/Clavulanic Acid    S Ampicillin                     S Cefepime                       S Ceftriaxone                    S Cefuroxime                     S Ciprofloxacin                  S Ertapenem                      S Gentamicin                     S Imipenem                       S Levofloxacin                   S Meropenem                      S Nitrofurantoin                 S Piperacillin/Tazobactam        S Tetracycline                   S Tobramycin                     S Trimethoprim/Sulfa             S   Microscopic Examination     Status: Abnormal   Collection Time: 02/09/20  2:00 PM   Urine  Result Value Ref Range Status   WBC, UA 6-10 (A) 0 - 5 /hpf Final   RBC None seen 0 - 2 /hpf Final   Epithelial Cells (non renal) 0-10 0 - 10 /hpf Final   Bacteria, UA Moderate (A)  None seen/Few Final  SARS Coronavirus 2 by RT PCR (hospital order, performed in [facility] hospital lab) Nasopharyngeal Nasopharyngeal Swab     Status: None   Collection Time: 02/12/20  7:57 AM   Specimen: Nasopharyngeal Swab  Result Value Ref Range Status   SARS Coronavirus 2 NEGATIVE NEGATIVE Final    Comment: (NOTE) SARS-CoV-2 target nucleic acids are NOT DETECTED.  The SARS-CoV-2 RNA is generally detectable in upper and lower respiratory specimens during the acute phase of infection. The lowest concentration of SARS-CoV-2 viral copies this assay can detect is 250 copies / mL. A negative result does not preclude SARS-CoV-2 infection and should not be used as the sole basis for treatment or other patient management decisions.  A negative result may occur with improper specimen collection / handling, submission of specimen other than nasopharyngeal swab, presence of viral mutation(s) within the areas targeted by this assay, and inadequate number of viral copies (<250 copies / mL). A negative result must be combined with clinical observations, patient history, and epidemiological information.  Fact Sheet for Patients:   StrictlyIdeas.no  Fact Sheet for Healthcare Providers: BankingDealers.co.za  This test is not yet approved or  cleared by the [place] FDA and has  been authorized for detection and/or diagnosis of SARS-CoV-2 by FDA under an Emergency Use Authorization (EUA).  This EUA will remain in effect (meaning this test can be used) for the duration of the COVID-19 declaration under Section 564(b)(1) of the Act, 21 U.S.C. section 360bbb-3(b)(1), unless the authorization is terminated or revoked sooner.  Performed at [facility], [address], [place], [place] [address]   Culture, blood (routine x 2) Call MD if unable to obtain prior to antibiotics being given     Status: None (Preliminary result)   Collection Time:  02/12/20 11:03 AM   Specimen: Right Antecubital; Blood  Result Value Ref Range Status   Specimen Description RIGHT ANTECUBITAL  Final   Special Requests   Final    BOTTLES DRAWN AEROBIC AND ANAEROBIC Blood Culture adequate volume   Culture   Final    NO GROWTH < 24 HOURS Performed at [facility], [address] [place], [place] [address]    Report Status PENDING  Incomplete  Culture, blood (routine x 2) Call MD if unable to obtain prior to antibiotics being given     Status: None (Preliminary result)   Collection Time: 02/12/20 11:04 AM   Specimen: BLOOD RIGHT WRIST  Result Value Ref Range Status   Specimen Description BLOOD RIGHT WRIST  Final   Special Requests   Final    BOTTLES DRAWN AEROBIC AND ANAEROBIC Blood Culture adequate volume   Culture   Final    NO GROWTH < 24 HOURS Performed at [facility], [address] [place], [place] [address]    Report Status PENDING  Incomplete     Radiology Studies: DG Chest Port 1 View  Result Date: 02/12/2020 CLINICAL DATA:  Shortness of breath. EXAM: PORTABLE CHEST 1 VIEW COMPARISON:  12/13/2019.  10/19/2019. FINDINGS: Cardiac pacer stable position. Prior cardiac valve replacement. Stable cardiomegaly. No pulmonary venous congestion. Bilateral upper lobe pulmonary infiltrates noted on today's exam. No pleural effusion or pneumothorax. IMPRESSION: 1. Cardiac pacer stable position. Prior cardiac valve replacement. Stable cardiomegaly. No pulmonary venous congestion. 2. Bilateral upper lobe pulmonary infiltrates noted on today's exam. Electronically Signed   By: [place]   On: 02/12/2020 07:21    Scheduled Meds:  arformoterol  15 mcg Nebulization BID   budesonide (PULMICORT) nebulizer solution  0.25 mg Nebulization BID   dextromethorphan-guaiFENesin  1 tablet Oral BID   famotidine  20 mg Oral QHS   heparin  5,000 Units Subcutaneous Q8H   levothyroxine  25 mcg Oral Daily   memantine  10 mg Oral BID   metoprolol tartrate   25 mg Oral BID   multivitamin with minerals  1 tablet Oral Daily   pantoprazole  40 mg Oral BID   predniSONE  40 mg Oral Q breakfast   umeclidinium bromide  1 puff Inhalation Daily   Continuous Infusions:  azithromycin 500 mg (02/13/20 1201)   cefTRIAXone (ROCEPHIN)  IV 2 g (02/13/20 1112)     LOS: 1 day    Time spent: 30 minutes.    Barton Dubois, MD [facility]   To contact the attending provider between 7A-7P or the covering provider during after hours 7P-7A, please log into the web site [email or web address] and access using universal [place] password for that web site. If you do not have the password, please call the hospital operator.  02/13/2020, 12:41 PM

## 2020-02-14 DIAGNOSIS — J441 Chronic obstructive pulmonary disease with (acute) exacerbation: Secondary | ICD-10-CM

## 2020-02-14 DIAGNOSIS — R0602 Shortness of breath: Secondary | ICD-10-CM

## 2020-02-14 LAB — BASIC METABOLIC PANEL
Anion gap: 8 (ref 5–15)
BUN: 24 mg/dL — ABNORMAL HIGH (ref 8–23)
CO2: 24 mmol/L (ref 22–32)
Calcium: 9.1 mg/dL (ref 8.9–10.3)
Chloride: 101 mmol/L (ref 98–111)
Creatinine, Ser: 1.44 mg/dL — ABNORMAL HIGH (ref 0.44–1.00)
GFR calc Af Amer: 37 mL/min — ABNORMAL LOW (ref >60)
GFR calc non Af Amer: 32 mL/min — ABNORMAL LOW (ref >60)
Glucose, Bld: 142 mg/dL — ABNORMAL HIGH (ref 70–99)
Potassium: 4.3 mmol/L (ref 3.5–5.1)
Sodium: 133 mmol/L — ABNORMAL LOW (ref 135–145)

## 2020-02-14 LAB — CBC
HCT: 29.9 % — ABNORMAL LOW (ref 36.0–46.0)
Hemoglobin: 9.3 g/dL — ABNORMAL LOW (ref 12.0–15.0)
MCH: 30.8 pg (ref 26.0–34.0)
MCHC: 31.1 g/dL (ref 30.0–36.0)
MCV: 99 fL (ref 80.0–100.0)
Platelets: 175 10*3/uL (ref 150–400)
RBC: 3.02 MIL/uL — ABNORMAL LOW (ref 3.87–5.11)
RDW: 13.8 % (ref 11.5–15.5)
WBC: 4.5 10*3/uL (ref 4.0–10.5)
nRBC: 0 % (ref 0.0–0.2)

## 2020-02-14 MED ORDER — DM-GUAIFENESIN ER 30-600 MG PO TB12: 1.0000 | ORAL_TABLET | Freq: Two times a day (BID) | ORAL | 0 refills | Status: AC

## 2020-02-14 MED ORDER — PANTOPRAZOLE SODIUM 40 MG PO TBEC
40.0000 mg | DELAYED_RELEASE_TABLET | Freq: Two times a day (BID) | ORAL | 0 refills | Status: DC
Start: 2020-02-14 — End: 2020-02-21

## 2020-02-14 MED ORDER — HALOPERIDOL LACTATE 5 MG/ML IJ SOLN
2.0000 mg | Freq: Once | INTRAMUSCULAR | Status: AC
  Administered 2020-02-14: 2 mg via INTRAVENOUS
  Filled 2020-02-14: qty 1

## 2020-02-14 MED ORDER — PANTOPRAZOLE SODIUM 40 MG IV SOLR: 40.0000 mg | Freq: Two times a day (BID) | INTRAVENOUS | Status: DC

## 2020-02-14 MED ORDER — CEFDINIR 300 MG PO CAPS: 300.0000 mg | ORAL_CAPSULE | Freq: Two times a day (BID) | ORAL | 0 refills | Status: DC

## 2020-02-14 MED ORDER — PREDNISONE 20 MG PO TABS: 40.0000 mg | ORAL_TABLET | Freq: Every day | ORAL | 0 refills | Status: DC

## 2020-02-14 NOTE — Discharge Summary (Signed)
Physician Discharge Summary  Crystal [place] DTO:[id number] DOB: 07-Jun-1927 DOA: 02/12/2020  PCP: [name], MD  Admit date: 02/12/2020 Discharge date: 02/14/2020  Time spent: 35 minutes  Recommendations for Outpatient Follow-up:  1. Repeat CBC to follow hemoglobin and stability 2. Repeat basic metabolic panel to follow electrolytes and renal function 3. Repeat chest x-ray at a.m. 4-6 weeks to assure complete resolution of infiltrates.   Discharge Diagnoses:  Active Problems:   Gastroesophageal reflux disease   Acute renal failure superimposed on stage 3b chronic kidney disease (HCC)   CAP (community acquired pneumonia)   SOB (shortness of breath)   COPD with acute exacerbation (HCC) Chronic diastolic heart failure Hypothyroidism Anemia of chronic kidney disease   Discharge Condition: Stable and improved.  Discharged home with instruction to follow-up with PCP in 10 days.  CODE STATUS: Full code.  Diet recommendation: Heart healthy/low-sodium diet.  Filed Weights   02/12/20 0622  Weight: 51.7 kg    History of present illness:  Crystal Cooper a 86 y.o.femalewith medical history significant ofsupraventricular tachycardia status post ablation in 2003, chronic renal failure stage IIIb-IV, history of asthma/COPD, gastroesophageal reflux disease, dementia without behavioral disturbances, chronic diastolic heart failure and hypertension; who presented to the hospital secondary to shortness of breath and increased productive cough. Family reported some chills but no frank fever. Symptoms have been present for the last 2-3 days. No sick contacts. No hemoptysis no productive cough has been reported; shortness of breath mainly with activities and decreased appetite. Patient denies chest pain, abdominal pain, dysuria, hematuria, melena, hematochezia, focal weakness or any other complaints.  ED Course:In the ED chest x-ray demonstrated acute bilateral upper lobe pulmonary  infiltrates suggesting pneumonia, acute on chronic renal failure,mild tachypnea and borderline normal oxygen saturation on room air. Cultures taken and antibiotics started for community-acquired pneumonia. Curb 65 calculated on more than 2 [facility] has been contacted to place patient in the hospital for further evaluation and management.  Hospital Course:  1-community-acquired pneumonia/COPD exacerbation -Reports significant improvement in her breathing -No fever, no hemoptysis, speaking in full sentences and with much improved expiratory wheezing. -Curb 65 more than 2 on presentation. -Patient discharged home with oral antibiotics and the steroids management. -Resumption of home inhalers/bronchodilators regimen. -No need for oxygen supplementation at discharge.  2-hypothyroidism -Continue Synthroid.  3-GERD -Continue PPI and famotidine -Protonix dose adjusted to twice a day for symptoms control and GI protection.  4-history of underlying dementia -Continue constant reorientation, supportive care and continue the use of Namenda -Normal behavior appreciated during examination. -Following commands appropriately.  5-acute on chronic renal failure A stage IIIb at baseline -Continue minimizing nephrotoxic agents -Patient advised to maintain adequate hydration. -Repeat basic metabolic panel follow-up visit to reassess renal function trend and stability. -Creatinine down to 1.44 (which is her baseline) at time of discharge..  6-history of diastolic heart failure -Condition appears to be compensated currently. -Continue Lopressor -Will resume home diuretics regimen and ask to check weight on daily basis will follow low-sodium diet.  7-essential hypertension/nonsustained supraventricular tachycardia -Has remained in sinus rhythm -Continue Lopressor -Stable vital signs. -Heart healthy diet encouraged.  8-anemia of chronic kidney disease/black stools -No signs of overt bleeding  appreciated -stable hemoglobin level -Patient chronically on iron supplementation -Continue to follow hemoglobin trend at follow-up visit. -PPI adjusted to twice a day for better symptoms control and stomach protection while using prednisone.   Procedures:  See below for x-ray reports  Consultations:  None  Discharge Exam: Vitals:  02/14/20 0819 02/14/20 0829  BP:    Pulse:    Resp:    Temp:    SpO2: 98% 98%    General: Afebrile, reports no chest pain or palpitations; speaking in full sentences and expressing significant improvement in her breathing.  No nausea or vomiting. Cardiovascular: Rate controlled, no rubs, no gallops, no JVD Respiratory: No using accessory muscles; very little expiratory wheezing and scattered rhonchi; good oxygen saturation on room air. Abdomen: Soft, nontender, nondistended, positive bowel sounds Extremities: No cyanosis or clubbing.  No edema appreciated on exam.  Discharge Instructions   Discharge Instructions    (HEART FAILURE PATIENTS) Call MD:  Anytime you have any of the following symptoms: 1) 3 pound weight gain in 24 hours or 5 pounds in 1 week 2) shortness of breath, with or without a dry hacking cough 3) swelling in the hands, feet or stomach 4) if you have to sleep on extra pillows at night in order to breathe.   Complete by: As directed    Diet - low sodium heart healthy   Complete by: As directed    Discharge instructions   Complete by: As directed    Take medications as prescribed Maintain adequate hydration Check weight on daily basis Follow low-sodium/heart healthy diet Arrange follow-up with PCP in 10 days   Increase activity slowly   Complete by: As directed      Allergies as of 02/14/2020      Reactions   Hctz [hydrochlorothiazide] Other (See Comments)   Pt was ill and this affected her kidneys   Aspirin Other (See Comments)   Cardiologist said the patient is to not take this   Codeine Other (See Comments)   Made  the patient feel ill, has not had any problems since 1977      Medication List    STOP taking these medications   sulfamethoxazole-trimethoprim 800-160 MG tablet Commonly known as: BACTRIM DS     TAKE these medications   acetaminophen 500 MG tablet Commonly known as: TYLENOL Take 500 mg by mouth every 6 (six) hours as needed for mild pain or headache.   albuterol 108 (90 Base) MCG/ACT inhaler Commonly known as: ProAir HFA Inhale 2 puffs into the lungs every 4 (four) hours as needed for wheezing or shortness of breath.   Biotin 10 MG Caps Take 10 mg by mouth daily.   Breztri Aerosphere 160-9-4.8 MCG/ACT Aero Generic drug: Budeson-Glycopyrrol-Formoterol Inhale 2 Inhalers into the lungs in the morning and at bedtime.   Caltrate 600+D3 600-800 MG-UNIT Tabs Generic drug: Calcium Carb-Cholecalciferol Take 1 tablet by mouth daily.   cefdinir 300 MG capsule Commonly known as: OMNICEF Take 1 capsule (300 mg total) by mouth 2 (two) times daily for 5 days.   dextromethorphan-guaiFENesin 30-600 MG 12hr tablet Commonly known as: MUCINEX DM Take 1 tablet by mouth 2 (two) times daily for 7 days.   famotidine 20 MG tablet Commonly known as: Pepcid Take 1 tablet (20 mg total) by mouth at bedtime.   ferrous sulfate 325 (65 FE) MG tablet Take 1 tablet (325 mg total) by mouth 2 (two) times daily with a meal.   furosemide 20 MG tablet Commonly known as: LASIX Take 3 tablets (60 mg total) by mouth daily.   levothyroxine 25 MCG tablet Commonly known as: SYNTHROID Take 1 tablet (25 mcg total) by mouth daily.   memantine 10 MG tablet Commonly known as: NAMENDA Take 1 tablet (10 mg total) by mouth 2 (two) times daily.  metoprolol tartrate 25 MG tablet Commonly known as: LOPRESSOR Take 1 tablet (25 mg total) by mouth 2 (two) times daily.   multivitamin-iron-minerals-folic acid chewable tablet Chew 1 tablet by mouth daily.   OVER THE COUNTER MEDICATION Take 0.5 tablets by mouth  daily as needed (for allergy). Patient not sure about the name.   pantoprazole 40 MG tablet Commonly known as: PROTONIX Take 1 tablet (40 mg total) by mouth 2 (two) times daily. What changed: See the new instructions.   potassium chloride 10 MEQ tablet Commonly known as: KLOR-CON Take 2 tablets (20 mEq total) by mouth 2 (two) times daily.   predniSONE 20 MG tablet Commonly known as: DELTASONE Take 2 tablets (40 mg total) by mouth daily with breakfast for 4 days. Start taking on: February 15, 2020      Allergies  Allergen Reactions  . Hctz [Hydrochlorothiazide] Other (See Comments)    Pt was ill and this affected her kidneys   . Aspirin Other (See Comments)    Cardiologist said the patient is to not take this  . Codeine Other (See Comments)    Made the patient feel ill, has not had any problems since 1977    Follow-up Information    Stacks, [name], MD. Schedule an appointment as soon as possible for a visit in 10 day(s).   Specialty: Family Medicine Contact information: [place] [address] [phone]        Lelon Perla, MD .   Specialty: Cardiology Contact information: [address] [place] [address] [phone]               The results of significant diagnostics from this hospitalization (including imaging, microbiology, ancillary and laboratory) are listed below for reference.    Significant Diagnostic Studies: DG Chest Port 1 View  Result Date: 02/12/2020 CLINICAL DATA:  Shortness of breath. EXAM: PORTABLE CHEST 1 VIEW COMPARISON:  12/13/2019.  10/19/2019. FINDINGS: Cardiac pacer stable position. Prior cardiac valve replacement. Stable cardiomegaly. No pulmonary venous congestion. Bilateral upper lobe pulmonary infiltrates noted on today's exam. No pleural effusion or pneumothorax. IMPRESSION: 1. Cardiac pacer stable position. Prior cardiac valve replacement. Stable cardiomegaly. No pulmonary venous congestion. 2.  Bilateral upper lobe pulmonary infiltrates noted on today's exam. Electronically Signed   By: [place]   On: 02/12/2020 07:21   CUP PACEART REMOTE DEVICE CHECK  Result Date: 02/10/2020 Scheduled remote reviewed. Normal device function.  Known persistent AF, not OAC candidate. Next remote 91 days. JM   Microbiology: Recent Results (from the past 240 hour(s))  Urine Culture     Status: Abnormal   Collection Time: 02/09/20  2:00 PM   Specimen: Urine   UR  Result Value Ref Range Status   Urine Culture, Routine Final report (A)  Final   Organism ID, Bacteria Escherichia coli (A)  Final    Comment: Cefazolin <=4 ug/mL Cefazolin with an MIC <=16 predicts susceptibility to the oral agents cefaclor, cefdinir, cefpodoxime, cefprozil, cefuroxime, cephalexin, and loracarbef when used for therapy of uncomplicated urinary tract infections due to E. coli, Klebsiella pneumoniae, and Proteus mirabilis. Greater than 100,000 colony forming units per mL    Antimicrobial Susceptibility Comment  Final    Comment:       ** S = Susceptible; I = Intermediate; R = Resistant **                    P = Positive; N = Negative  MICS are expressed in micrograms per mL    Antibiotic                 RSLT#1    RSLT#2    RSLT#3    RSLT#4 Amoxicillin/Clavulanic Acid    S Ampicillin                     S Cefepime                       S Ceftriaxone                    S Cefuroxime                     S Ciprofloxacin                  S Ertapenem                      S Gentamicin                     S Imipenem                       S Levofloxacin                   S Meropenem                      S Nitrofurantoin                 S Piperacillin/Tazobactam        S Tetracycline                   S Tobramycin                     S Trimethoprim/Sulfa             S   Microscopic Examination     Status: Abnormal   Collection Time: 02/09/20  2:00 PM   Urine  Result Value Ref Range Status   WBC,  UA 6-10 (A) 0 - 5 /hpf Final   RBC None seen 0 - 2 /hpf Final   Epithelial Cells (non renal) 0-10 0 - 10 /hpf Final   Bacteria, UA Moderate (A) None seen/Few Final  SARS Coronavirus 2 by RT PCR (hospital order, performed in [place] hospital lab) Nasopharyngeal Nasopharyngeal Swab     Status: None   Collection Time: 02/12/20  7:57 AM   Specimen: Nasopharyngeal Swab  Result Value Ref Range Status   SARS Coronavirus 2 NEGATIVE NEGATIVE Final    Comment: (NOTE) SARS-CoV-2 target nucleic acids are NOT DETECTED.  The SARS-CoV-2 RNA is generally detectable in upper and lower respiratory specimens during the acute phase of infection. The lowest concentration of SARS-CoV-2 viral copies this assay can detect is 250 copies / mL. A negative result does not preclude SARS-CoV-2 infection and should not be used as the sole basis for treatment or other patient management decisions.  A negative result may occur with improper specimen collection / handling, submission of specimen other than nasopharyngeal swab, presence of viral mutation(s) within the areas targeted by this assay, and inadequate number of viral copies (<250 copies / mL). A negative result must be combined with clinical observations, patient history, and epidemiological information.  Fact Sheet for Patients:  StrictlyIdeas.no  Fact Sheet for Healthcare Providers: BankingDealers.co.za  This test is not yet approved or  cleared by the [place] FDA and has been authorized for detection and/or diagnosis of SARS-CoV-2 by FDA under an Emergency Use Authorization (EUA).  This EUA will remain in effect (meaning this test can be used) for the duration of the COVID-19 declaration under Section 564(b)(1) of the Act, 21 U.S.C. section 360bbb-3(b)(1), unless the authorization is terminated or revoked sooner.  Performed at [facility], [address] [place], [place] [address]    Culture, blood (routine x 2) Call MD if unable to obtain prior to antibiotics being given     Status: None (Preliminary result)   Collection Time: 02/12/20 11:03 AM   Specimen: Right Antecubital; Blood  Result Value Ref Range Status   Specimen Description RIGHT ANTECUBITAL  Final   Special Requests   Final    BOTTLES DRAWN AEROBIC AND ANAEROBIC Blood Culture adequate volume   Culture   Final    NO GROWTH 2 DAYS Performed at [facility], [address] [place], [place] [address]    Report Status PENDING  Incomplete  Culture, blood (routine x 2) Call MD if unable to obtain prior to antibiotics being given     Status: None (Preliminary result)   Collection Time: 02/12/20 11:04 AM   Specimen: BLOOD RIGHT WRIST  Result Value Ref Range Status   Specimen Description BLOOD RIGHT WRIST  Final   Special Requests   Final    BOTTLES DRAWN AEROBIC AND ANAEROBIC Blood Culture adequate volume   Culture   Final    NO GROWTH 2 DAYS Performed at [facility], [address] [place], [place] [address]    Report Status PENDING  Incomplete  Urine culture     Status: None   Collection Time: 02/12/20  1:53 PM   Specimen: Urine, Clean Catch  Result Value Ref Range Status   Specimen Description   Final    URINE, CLEAN CATCH Performed at [facility] [place], [address], [place], [place] [address]    Special Requests   Final    NONE Performed at [facility], [address] [place], [place] [address]    Culture   Final    NO GROWTH Performed at [facility], [place] [address], [place], [place] [address]    Report Status 02/13/2020 FINAL  Final     Labs: Basic Metabolic Panel: Recent Labs  Lab 02/09/20 1442 02/12/20 0752 02/13/20 0617 02/14/20 0728  NA 137 134* 135 133*  K 4.1 4.7 4.8 4.3  CL 96 98 104 101  CO2 29 27 26 24   GLUCOSE 100* 94 94 142*  BUN 21 32* 25* 24*  CREATININE 1.26* 2.00* 1.66* 1.44*  CALCIUM 8.9 9.0 8.7* 9.1   Liver Function Tests: Recent Labs  Lab  02/09/20 1442  AST 28  ALT 16  ALKPHOS 93  BILITOT 0.4  PROT 6.3  ALBUMIN 3.6   CBC: Recent Labs  Lab 02/09/20 1442 02/12/20 0752 02/13/20 0617 02/14/20 0728  WBC 5.1 3.8* 3.5* 4.5  NEUTROABS 3.6  --   --   --   HGB 10.5* 9.9* 9.2* 9.3*  HCT 32.4* 31.7* 29.4* 29.9*  MCV 96 99.1 100.0 99.0  PLT 197 181 159 175   BNP (last 3 results) Recent Labs    08/26/19 1135 12/13/19 0625 02/12/20 0752  BNP 483.0* 468.0* 313.0*    Signed:  Barton Dubois MD.  [facility] 02/14/2020, 12:30  PM

## 2020-02-14 NOTE — Progress Notes (Signed)
Discharge instructions reviewed with patient and daughter, Crystal [place]. PIV discontinued and patient accompanied downstairs in wheelchair.

## 2020-02-14 NOTE — Progress Notes (Signed)
Patient had a large bloody stool. On-call clinician made aware via [place].

## 2020-02-14 NOTE — Progress Notes (Signed)
Notified of large black/bloody stool. HH ordered as well as fobt.

## 2020-02-15 ENCOUNTER — Telehealth: Payer: Self-pay | Admitting: *Deleted

## 2020-02-15 ENCOUNTER — Telehealth: Payer: Self-pay | Admitting: Family Medicine

## 2020-02-15 NOTE — Telephone Encounter (Signed)
    Transitional Care Management  Contact Attempt Attempt Date:02/15/2020 Attempted By:   1st unsuccessful TCM contact attempt.   I reached out to [name] on her preferred telephone number to discuss Transitional Care Management, medication reconciliation, and to schedule a TCM hospital follow-up with her PCP at [facility].  Discharge Date: 02/14/20 Location: [facility] Discharge Dx: Gastroesophageal reflux disease   Acute renal failure superimposed on stage 3b chronic kidney disease ([place])   CAP (community acquired pneumonia)   SOB (shortness of breath)   COPD with acute exacerbation (HCC) Chronic diastolic heart failure Hypothyroidism Anemia of chronic kidney disease   Recommendations for Outpatient Follow-up:  1. Repeat CBC to follow hemoglobin and stability 2. Repeat basic metabolic panel to follow electrolytes and renal function Repeat chest x-ray at a.m. 4-6 weeks to assure complete resolution of infiltrates.   Plan I left a HIPPA compliant message for her to return my call.  Will attempt to contact again within the 2 business day post discharge window if she does not return my call.

## 2020-02-15 NOTE — Telephone Encounter (Signed)
It is safe, but immodium AD is better.

## 2020-02-15 NOTE — Telephone Encounter (Signed)
Patient aware and verbalized understanding. °

## 2020-02-15 NOTE — Telephone Encounter (Signed)
Do you think it is safe for her to take?

## 2020-02-16 ENCOUNTER — Other Ambulatory Visit: Payer: Self-pay

## 2020-02-16 ENCOUNTER — Inpatient Hospital Stay (HOSPITAL_COMMUNITY): Payer: Medicare Other

## 2020-02-16 DIAGNOSIS — D500 Iron deficiency anemia secondary to blood loss (chronic): Secondary | ICD-10-CM

## 2020-02-16 DIAGNOSIS — K922 Gastrointestinal hemorrhage, unspecified: Secondary | ICD-10-CM | POA: Diagnosis not present

## 2020-02-16 DIAGNOSIS — F0150 Vascular dementia without behavioral disturbance: Secondary | ICD-10-CM | POA: Diagnosis not present

## 2020-02-16 DIAGNOSIS — I4819 Other persistent atrial fibrillation: Secondary | ICD-10-CM | POA: Diagnosis not present

## 2020-02-16 DIAGNOSIS — I10 Essential (primary) hypertension: Secondary | ICD-10-CM | POA: Diagnosis not present

## 2020-02-16 DIAGNOSIS — Z7952 Long term (current) use of systemic steroids: Secondary | ICD-10-CM | POA: Diagnosis not present

## 2020-02-16 DIAGNOSIS — G319 Degenerative disease of nervous system, unspecified: Secondary | ICD-10-CM | POA: Diagnosis not present

## 2020-02-16 DIAGNOSIS — J440 Chronic obstructive pulmonary disease with acute lower respiratory infection: Secondary | ICD-10-CM | POA: Diagnosis not present

## 2020-02-16 DIAGNOSIS — M47812 Spondylosis without myelopathy or radiculopathy, cervical region: Secondary | ICD-10-CM | POA: Diagnosis not present

## 2020-02-16 DIAGNOSIS — D631 Anemia in chronic kidney disease: Secondary | ICD-10-CM | POA: Diagnosis not present

## 2020-02-16 DIAGNOSIS — K219 Gastro-esophageal reflux disease without esophagitis: Secondary | ICD-10-CM | POA: Diagnosis not present

## 2020-02-16 DIAGNOSIS — Z8673 Personal history of transient ischemic attack (TIA), and cerebral infarction without residual deficits: Secondary | ICD-10-CM | POA: Diagnosis not present

## 2020-02-16 DIAGNOSIS — Z950 Presence of cardiac pacemaker: Secondary | ICD-10-CM | POA: Diagnosis not present

## 2020-02-16 DIAGNOSIS — M170 Bilateral primary osteoarthritis of knee: Secondary | ICD-10-CM | POA: Diagnosis not present

## 2020-02-16 DIAGNOSIS — M4802 Spinal stenosis, cervical region: Secondary | ICD-10-CM | POA: Diagnosis not present

## 2020-02-16 DIAGNOSIS — I480 Paroxysmal atrial fibrillation: Secondary | ICD-10-CM | POA: Diagnosis not present

## 2020-02-16 DIAGNOSIS — I495 Sick sinus syndrome: Secondary | ICD-10-CM | POA: Diagnosis not present

## 2020-02-16 DIAGNOSIS — Z20822 Contact with and (suspected) exposure to covid-19: Secondary | ICD-10-CM | POA: Diagnosis not present

## 2020-02-16 DIAGNOSIS — N184 Chronic kidney disease, stage 4 (severe): Secondary | ICD-10-CM | POA: Diagnosis not present

## 2020-02-16 DIAGNOSIS — N3000 Acute cystitis without hematuria: Secondary | ICD-10-CM | POA: Diagnosis not present

## 2020-02-16 DIAGNOSIS — I5032 Chronic diastolic (congestive) heart failure: Secondary | ICD-10-CM | POA: Diagnosis not present

## 2020-02-16 DIAGNOSIS — J441 Chronic obstructive pulmonary disease with (acute) exacerbation: Secondary | ICD-10-CM | POA: Diagnosis not present

## 2020-02-16 DIAGNOSIS — I471 Supraventricular tachycardia: Secondary | ICD-10-CM | POA: Diagnosis not present

## 2020-02-16 DIAGNOSIS — H9190 Unspecified hearing loss, unspecified ear: Secondary | ICD-10-CM | POA: Diagnosis not present

## 2020-02-16 DIAGNOSIS — Z7951 Long term (current) use of inhaled steroids: Secondary | ICD-10-CM | POA: Diagnosis not present

## 2020-02-16 DIAGNOSIS — I130 Hypertensive heart and chronic kidney disease with heart failure and stage 1 through stage 4 chronic kidney disease, or unspecified chronic kidney disease: Secondary | ICD-10-CM | POA: Diagnosis not present

## 2020-02-16 DIAGNOSIS — J189 Pneumonia, unspecified organism: Secondary | ICD-10-CM | POA: Diagnosis not present

## 2020-02-16 DIAGNOSIS — K254 Chronic or unspecified gastric ulcer with hemorrhage: Secondary | ICD-10-CM | POA: Diagnosis not present

## 2020-02-16 DIAGNOSIS — H409 Unspecified glaucoma: Secondary | ICD-10-CM | POA: Diagnosis not present

## 2020-02-16 DIAGNOSIS — N1832 Chronic kidney disease, stage 3b: Secondary | ICD-10-CM | POA: Diagnosis not present

## 2020-02-16 DIAGNOSIS — E039 Hypothyroidism, unspecified: Secondary | ICD-10-CM | POA: Diagnosis not present

## 2020-02-16 DIAGNOSIS — N179 Acute kidney failure, unspecified: Secondary | ICD-10-CM | POA: Diagnosis not present

## 2020-02-16 LAB — COMPREHENSIVE METABOLIC PANEL
ALT: 47 U/L — ABNORMAL HIGH (ref 0–44)
AST: 46 U/L — ABNORMAL HIGH (ref 15–41)
Albumin: 3.7 g/dL (ref 3.5–5.0)
Alkaline Phosphatase: 79 U/L (ref 38–126)
Anion gap: 9 (ref 5–15)
BUN: 23 mg/dL (ref 8–23)
CO2: 26 mmol/L (ref 22–32)
Calcium: 9.2 mg/dL (ref 8.9–10.3)
Chloride: 96 mmol/L — ABNORMAL LOW (ref 98–111)
Creatinine, Ser: 1.62 mg/dL — ABNORMAL HIGH (ref 0.44–1.00)
GFR calc Af Amer: 32 mL/min — ABNORMAL LOW (ref >60)
GFR calc non Af Amer: 27 mL/min — ABNORMAL LOW (ref >60)
Glucose, Bld: 122 mg/dL — ABNORMAL HIGH (ref 70–99)
Potassium: 4.6 mmol/L (ref 3.5–5.1)
Sodium: 131 mmol/L — ABNORMAL LOW (ref 135–145)
Total Bilirubin: 0.7 mg/dL (ref 0.3–1.2)
Total Protein: 6.5 g/dL (ref 6.5–8.1)

## 2020-02-16 LAB — CBC WITH DIFFERENTIAL/PLATELET
Abs Immature Granulocytes: 0.04 10*3/uL (ref 0.00–0.07)
Basophils Absolute: 0 10*3/uL (ref 0.0–0.1)
Basophils Relative: 0 %
Eosinophils Absolute: 0 10*3/uL (ref 0.0–0.5)
Eosinophils Relative: 0 %
HCT: 30.9 % — ABNORMAL LOW (ref 36.0–46.0)
Hemoglobin: 9.7 g/dL — ABNORMAL LOW (ref 12.0–15.0)
Immature Granulocytes: 1 %
Lymphocytes Relative: 7 %
Lymphs Abs: 0.6 10*3/uL — ABNORMAL LOW (ref 0.7–4.0)
MCH: 31 pg (ref 26.0–34.0)
MCHC: 31.4 g/dL (ref 30.0–36.0)
MCV: 98.7 fL (ref 80.0–100.0)
Monocytes Absolute: 0.2 10*3/uL (ref 0.1–1.0)
Monocytes Relative: 2 %
Neutro Abs: 7.5 10*3/uL (ref 1.7–7.7)
Neutrophils Relative %: 90 %
Platelets: 210 10*3/uL (ref 150–400)
RBC: 3.13 MIL/uL — ABNORMAL LOW (ref 3.87–5.11)
RDW: 14 % (ref 11.5–15.5)
WBC: 8.3 10*3/uL (ref 4.0–10.5)
nRBC: 0 % (ref 0.0–0.2)

## 2020-02-16 LAB — FERRITIN: Ferritin: 87 ng/mL (ref 11–307)

## 2020-02-16 LAB — IRON AND TIBC
Iron: 118 ug/dL (ref 28–170)
Saturation Ratios: 37 % — ABNORMAL HIGH (ref 10.4–31.8)
TIBC: 321 ug/dL (ref 250–450)
UIBC: 203 ug/dL

## 2020-02-16 LAB — LACTATE DEHYDROGENASE: LDH: 229 U/L — ABNORMAL HIGH (ref 98–192)

## 2020-02-16 NOTE — Telephone Encounter (Signed)
TRANSITIONAL CARE MANAGEMENT TELEPHONE OUTREACH NOTE   Contact Date: 02/16/2020 Contacted By: Brynda Peon   DISCHARGE INFORMATION Date of Discharge:02/14/2020 Discharge Facility: [facility] Discharge Diagnosis:Pneumonia   Outpatient Follow Up Recommendations (copied from discharge summary) 1. Repeat CBC to follow hemoglobin and stability 2. Repeat basic metabolic panel to follow electrolytes and renal function 3. Repeat chest x-ray at a.m. 4-6 weeks to assure complete resolution of infiltrates.  Crystal [place] is a female primary care patient of Stacks, [name], MD. An outgoing telephone call was made today and I spoke with [name](daughter).  Ms. Abad condition(s) and treatment(s) were discussed. An opportunity to ask questions was provided and all were answered or forwarded as appropriate.    ACTIVITIES OF DAILY LIVING  Crystal [place] lives alone and she cannot perform ADLs independently. her primary caregiver is Diesha Rostad. she is able to depend on her primary caregiver(s) for consistent help. Transportation to appointments, to pick up medications, and to run errands is a problem.  (Consider referral to [facility] if transportation or a consistent caregiver is a problem)   Fall Risk Fall Risk  02/09/2020 01/12/2020  Falls in the past year? 0 0  Number falls in past yr: - -  Injury with Fall? - -  Risk for fall due to : - -  Follow up Falls evaluation completed Falls evaluation completed    high [place] Modifications/Assistive Devices Wheelchair: No Cane: Yes Ramp: No Bedside Toilet: No Hospital Bed:  No    [place] she is receiving home health Physical therapy services.     MEDICATION RECONCILIATION  Ms. Funaro has been able to pick-up all prescribed discharge medications from the pharmacy.   A post discharge medication reconciliation was performed and the complete medication list was reviewed with the patient/caregiver and  is current as of 02/16/2020. Changes highlighted below.  Discontinued Medications   Current Medication List Allergies as of 02/15/2020      Reactions   Hctz [hydrochlorothiazide] Other (See Comments)   Pt was ill and this affected her kidneys   Aspirin Other (See Comments)   Cardiologist said the patient is to not take this   Codeine Other (See Comments)   Made the patient feel ill, has not had any problems since 1977      Medication List       Accurate as of February 15, 2020 11:59 PM. If you have any questions, ask your nurse or doctor.        acetaminophen 500 MG tablet Commonly known as: TYLENOL Take 500 mg by mouth every 6 (six) hours as needed for mild pain or headache.   albuterol 108 (90 Base) MCG/ACT inhaler Commonly known as: ProAir HFA Inhale 2 puffs into the lungs every 4 (four) hours as needed for wheezing or shortness of breath.   Biotin 10 MG Caps Take 10 mg by mouth daily.   Breztri Aerosphere 160-9-4.8 MCG/ACT Aero Generic drug: Budeson-Glycopyrrol-Formoterol Inhale 2 Inhalers into the lungs in the morning and at bedtime.   Caltrate 600+D3 600-800 MG-UNIT Tabs Generic drug: Calcium Carb-Cholecalciferol Take 1 tablet by mouth daily.   cefdinir 300 MG capsule Commonly known as: OMNICEF Take 1 capsule (300 mg total) by mouth 2 (two) times daily for 5 days.   dextromethorphan-guaiFENesin 30-600 MG 12hr tablet Commonly known as: MUCINEX DM Take 1 tablet by mouth 2 (two) times daily for 7 days.   famotidine 20 MG tablet Commonly known as:  Pepcid Take 1 tablet (20 mg total) by mouth at bedtime.   ferrous sulfate 325 (65 FE) MG tablet Take 1 tablet (325 mg total) by mouth 2 (two) times daily with a meal.   furosemide 20 MG tablet Commonly known as: LASIX Take 3 tablets (60 mg total) by mouth daily.   levothyroxine 25 MCG tablet Commonly known as: SYNTHROID Take 1 tablet (25 mcg total) by mouth daily.   memantine 10 MG tablet Commonly known as:  NAMENDA Take 1 tablet (10 mg total) by mouth 2 (two) times daily.   metoprolol tartrate 25 MG tablet Commonly known as: LOPRESSOR Take 1 tablet (25 mg total) by mouth 2 (two) times daily.   multivitamin-iron-minerals-folic acid chewable tablet Chew 1 tablet by mouth daily.   OVER THE COUNTER MEDICATION Take 0.5 tablets by mouth daily as needed (for allergy). Patient not sure about the name.   pantoprazole 40 MG tablet Commonly known as: PROTONIX Take 1 tablet (40 mg total) by mouth 2 (two) times daily.   potassium chloride 10 MEQ tablet Commonly known as: KLOR-CON Take 2 tablets (20 mEq total) by mouth 2 (two) times daily.   predniSONE 20 MG tablet Commonly known as: DELTASONE Take 2 tablets (40 mg total) by mouth daily with breakfast for 4 days.        PATIENT EDUCATION & FOLLOW-UP PLAN  An appointment for Transitional Care Management is scheduled with Vonna Kotyk, [place] MD on 02/23/2021 at 2.:55.  Take all medications as prescribed  Contact our office by calling [phone] if you have any questions or concerns

## 2020-02-17 DIAGNOSIS — J441 Chronic obstructive pulmonary disease with (acute) exacerbation: Secondary | ICD-10-CM | POA: Diagnosis not present

## 2020-02-17 DIAGNOSIS — J440 Chronic obstructive pulmonary disease with acute lower respiratory infection: Secondary | ICD-10-CM | POA: Diagnosis not present

## 2020-02-17 DIAGNOSIS — J189 Pneumonia, unspecified organism: Secondary | ICD-10-CM | POA: Diagnosis not present

## 2020-02-17 DIAGNOSIS — I130 Hypertensive heart and chronic kidney disease with heart failure and stage 1 through stage 4 chronic kidney disease, or unspecified chronic kidney disease: Secondary | ICD-10-CM | POA: Diagnosis not present

## 2020-02-17 DIAGNOSIS — N3000 Acute cystitis without hematuria: Secondary | ICD-10-CM | POA: Diagnosis not present

## 2020-02-17 DIAGNOSIS — I5032 Chronic diastolic (congestive) heart failure: Secondary | ICD-10-CM | POA: Diagnosis not present

## 2020-02-17 LAB — CULTURE, BLOOD (ROUTINE X 2)
Culture: NO GROWTH
Culture: NO GROWTH
Special Requests: ADEQUATE
Special Requests: ADEQUATE

## 2020-02-18 ENCOUNTER — Inpatient Hospital Stay (HOSPITAL_COMMUNITY)
Admission: EM | Admit: 2020-02-18 | Discharge: 2020-02-21 | DRG: 378 | Disposition: A | Discharge status: Home / Self Care | Payer: Medicare Other | Attending: Internal Medicine | Admitting: Internal Medicine

## 2020-02-18 ENCOUNTER — Other Ambulatory Visit: Payer: Self-pay

## 2020-02-18 ENCOUNTER — Telehealth: Payer: Self-pay | Admitting: Family Medicine

## 2020-02-18 ENCOUNTER — Encounter (HOSPITAL_COMMUNITY): Payer: Self-pay | Admitting: *Deleted

## 2020-02-18 DIAGNOSIS — N183 Chronic kidney disease, stage 3 unspecified: Secondary | ICD-10-CM

## 2020-02-18 DIAGNOSIS — I4819 Other persistent atrial fibrillation: Secondary | ICD-10-CM | POA: Diagnosis present

## 2020-02-18 DIAGNOSIS — K922 Gastrointestinal hemorrhage, unspecified: Secondary | ICD-10-CM

## 2020-02-18 DIAGNOSIS — Z952 Presence of prosthetic heart valve: Secondary | ICD-10-CM

## 2020-02-18 DIAGNOSIS — G309 Alzheimer's disease, unspecified: Secondary | ICD-10-CM | POA: Diagnosis present

## 2020-02-18 DIAGNOSIS — Z20822 Contact with and (suspected) exposure to covid-19: Secondary | ICD-10-CM | POA: Diagnosis present

## 2020-02-18 DIAGNOSIS — N1832 Chronic kidney disease, stage 3b: Secondary | ICD-10-CM | POA: Diagnosis present

## 2020-02-18 DIAGNOSIS — Z8673 Personal history of transient ischemic attack (TIA), and cerebral infarction without residual deficits: Secondary | ICD-10-CM

## 2020-02-18 DIAGNOSIS — F0390 Unspecified dementia without behavioral disturbance: Secondary | ICD-10-CM

## 2020-02-18 DIAGNOSIS — H9190 Unspecified hearing loss, unspecified ear: Secondary | ICD-10-CM | POA: Diagnosis present

## 2020-02-18 DIAGNOSIS — R001 Bradycardia, unspecified: Secondary | ICD-10-CM | POA: Diagnosis present

## 2020-02-18 DIAGNOSIS — F0150 Vascular dementia without behavioral disturbance: Secondary | ICD-10-CM | POA: Diagnosis present

## 2020-02-18 DIAGNOSIS — K921 Melena: Secondary | ICD-10-CM | POA: Diagnosis present

## 2020-02-18 DIAGNOSIS — I480 Paroxysmal atrial fibrillation: Secondary | ICD-10-CM | POA: Diagnosis present

## 2020-02-18 DIAGNOSIS — Z8249 Family history of ischemic heart disease and other diseases of the circulatory system: Secondary | ICD-10-CM

## 2020-02-18 DIAGNOSIS — K254 Chronic or unspecified gastric ulcer with hemorrhage: Principal | ICD-10-CM | POA: Diagnosis present

## 2020-02-18 DIAGNOSIS — Q2730 Arteriovenous malformation, site unspecified: Secondary | ICD-10-CM

## 2020-02-18 DIAGNOSIS — J449 Chronic obstructive pulmonary disease, unspecified: Secondary | ICD-10-CM

## 2020-02-18 DIAGNOSIS — Z79890 Hormone replacement therapy (postmenopausal): Secondary | ICD-10-CM

## 2020-02-18 DIAGNOSIS — I10 Essential (primary) hypertension: Secondary | ICD-10-CM | POA: Diagnosis present

## 2020-02-18 DIAGNOSIS — D62 Acute posthemorrhagic anemia: Secondary | ICD-10-CM

## 2020-02-18 DIAGNOSIS — F0280 Dementia in other diseases classified elsewhere without behavioral disturbance: Secondary | ICD-10-CM | POA: Diagnosis present

## 2020-02-18 DIAGNOSIS — I130 Hypertensive heart and chronic kidney disease with heart failure and stage 1 through stage 4 chronic kidney disease, or unspecified chronic kidney disease: Secondary | ICD-10-CM | POA: Diagnosis present

## 2020-02-18 DIAGNOSIS — Z79899 Other long term (current) drug therapy: Secondary | ICD-10-CM

## 2020-02-18 DIAGNOSIS — E039 Hypothyroidism, unspecified: Secondary | ICD-10-CM | POA: Diagnosis present

## 2020-02-18 DIAGNOSIS — I5032 Chronic diastolic (congestive) heart failure: Secondary | ICD-10-CM | POA: Diagnosis present

## 2020-02-18 DIAGNOSIS — D500 Iron deficiency anemia secondary to blood loss (chronic): Secondary | ICD-10-CM | POA: Diagnosis present

## 2020-02-18 DIAGNOSIS — Z950 Presence of cardiac pacemaker: Secondary | ICD-10-CM

## 2020-02-18 LAB — CBC WITH DIFFERENTIAL/PLATELET
Abs Immature Granulocytes: 0.05 10*3/uL (ref 0.00–0.07)
Basophils Absolute: 0 10*3/uL (ref 0.0–0.1)
Basophils Relative: 0 %
Eosinophils Absolute: 0 10*3/uL (ref 0.0–0.5)
Eosinophils Relative: 0 %
HCT: 28.8 % — ABNORMAL LOW (ref 36.0–46.0)
Hemoglobin: 9.1 g/dL — ABNORMAL LOW (ref 12.0–15.0)
Immature Granulocytes: 1 %
Lymphocytes Relative: 7 %
Lymphs Abs: 0.5 10*3/uL — ABNORMAL LOW (ref 0.7–4.0)
MCH: 31.4 pg (ref 26.0–34.0)
MCHC: 31.6 g/dL (ref 30.0–36.0)
MCV: 99.3 fL (ref 80.0–100.0)
Monocytes Absolute: 0.3 10*3/uL (ref 0.1–1.0)
Monocytes Relative: 5 %
Neutro Abs: 5.6 10*3/uL (ref 1.7–7.7)
Neutrophils Relative %: 87 %
Platelets: 225 10*3/uL (ref 150–400)
RBC: 2.9 MIL/uL — ABNORMAL LOW (ref 3.87–5.11)
RDW: 14.3 % (ref 11.5–15.5)
WBC: 6.4 10*3/uL (ref 4.0–10.5)
nRBC: 0 % (ref 0.0–0.2)

## 2020-02-18 LAB — BASIC METABOLIC PANEL
Anion gap: 10 (ref 5–15)
BUN: 40 mg/dL — ABNORMAL HIGH (ref 8–23)
CO2: 27 mmol/L (ref 22–32)
Calcium: 8.7 mg/dL — ABNORMAL LOW (ref 8.9–10.3)
Chloride: 97 mmol/L — ABNORMAL LOW (ref 98–111)
Creatinine, Ser: 1.88 mg/dL — ABNORMAL HIGH (ref 0.44–1.00)
GFR calc Af Amer: 27 mL/min — ABNORMAL LOW (ref >60)
GFR calc non Af Amer: 23 mL/min — ABNORMAL LOW (ref >60)
Glucose, Bld: 157 mg/dL — ABNORMAL HIGH (ref 70–99)
Potassium: 4.4 mmol/L (ref 3.5–5.1)
Sodium: 134 mmol/L — ABNORMAL LOW (ref 135–145)

## 2020-02-18 LAB — TYPE AND SCREEN
ABO/RH(D): A POS
Antibody Screen: NEGATIVE

## 2020-02-18 LAB — POC OCCULT BLOOD, ED: Fecal Occult Bld: POSITIVE — AB

## 2020-02-18 MED ORDER — FAMOTIDINE IN NACL 20-0.9 MG/50ML-% IV SOLN
20.0000 mg | INTRAVENOUS | Status: AC
  Administered 2020-02-18: 20 mg via INTRAVENOUS
  Filled 2020-02-18: qty 50

## 2020-02-18 MED ORDER — PANTOPRAZOLE SODIUM 40 MG IV SOLR
40.0000 mg | INTRAVENOUS | Status: AC
  Administered 2020-02-18: 40 mg via INTRAVENOUS
  Filled 2020-02-18: qty 40

## 2020-02-18 NOTE — Telephone Encounter (Signed)
Advised daughter to take patient to ER to find out where she is bleeding. Daughter states she was really weak

## 2020-02-18 NOTE — ED Triage Notes (Signed)
Pt with bloody stools that is bright red and black noted in stools for a week.  Pt was recently admitted here.

## 2020-02-18 NOTE — ED Provider Notes (Signed)
[facility] EMERGENCY DEPARTMENT Provider Note   CSN: [id number] Arrival date & time: 02/18/20  1809     History Chief Complaint  Patient presents with  . GI Bleeding    Crystal Cooper is a 86 y.o. female.  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  In April of this year she had an upper GI bleed causing melena and required a coagulation intervention to prevent the bleeding.  Since that time she is done okay but is struggled with intermittent anemia and over the last several days she has had increased amounts of both dark red-black and bright red stools.  She has had some intermittent abdominal cramping but at this time is not feeling anything poorly at all.  She comes in tonight because she has had at least 3 bowel movements prior to arrival and another bowel movement while she was in the waiting room which all contain blood.  Again she is not anticoagulated.  No nausea vomiting fevers chills diaphoresis lightheadedness or near syncope.  She has required blood transfusions in the past as well as iron infusions as well  I have personally reviewed the medical record and found that the patient has a worsening anemia.  Over the last 9 days the patient is dropped approximately 1-1/2 g of hemoglobin.   Past Medical History:  Diagnosis Date  . AKI (acute kidney injury) ([place])   . Anemia    years ago  . Aortic stenosis   . Arthritis   . Asthma   . Atrial fibrillation ([place])   . CHF (congestive heart failure) ([place]) 11/2014  . CKD (chronic kidney disease)   . Dementia ([place])   . Family history of adverse reaction to anesthesia    2 daughters would have N/V  . Gastric AVM   . GI bleed   . Glaucoma   . Hearing loss   . Heart murmur   . HTN (hypertension)   . Hypokalemia   . Pneumonia   . Prolapsing mitral leaflet syndrome   . Shortness of breath dyspnea     with exertion  . Stroke ([place])   . SVT (supraventricular tachycardia) ([facility])    S/P ablation of AVNRT in 2003    Patient Active Problem List   Diagnosis Date Noted  . SOB (shortness of breath)   . COPD with acute exacerbation ([place])   . CAP (community acquired pneumonia) 02/12/2020  . Bilateral impacted cerumen 11/27/2019  . Long term (current) use of anticoagulants   . Acute renal failure superimposed on stage 3b chronic kidney disease ([place])   . Wheezing 09/23/2019  . Abnormal breath sounds 09/09/2019  . Heme positive stool   . Acute CVA (cerebrovascular accident) ([place]) 08/27/2019  . PNA (pneumonia) 08/25/2019  . Hypokalemia 08/23/2019  . Angio-edema, initial encounter 08/23/2019  . Reactive airway disease 03/24/2019  . Gastroesophageal reflux disease 03/24/2019  . CKD (chronic kidney disease), stage III 11/12/2018  . GI bleed 05/12/2018  . 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 Diastolic dysfunction 74/05/8785  . Pacemaker 05/12/2018  . Iron deficiency anemia   . Melena   . Duodenal arteriovenous malformation   . Gastritis and gastroduodenitis   . Normocytic anemia 04/06/2018  . Osteoporosis 08/27/2016  . ASCVD (arteriosclerotic cardiovascular disease) 01/11/2016  . Asthma 01/11/2016  . Glaucoma 01/11/2016  . Hearing loss 01/11/2016  .  Other nonrheumatic mitral valve disorders 01/11/2016  . Junctional bradycardia   . PAF (paroxysmal atrial fibrillation) ([place]) 09/06/2015  . Hyponatremia 12/17/2014  . Congestive heart failure ([place]) 12/12/2014  . Murmur 10/19/2011  . Chest pain 10/19/2011  . Essential hypertension 10/19/2011  . History of cardiac radiofrequency ablation (RFA) 10/19/2011    Past Surgical History:  Procedure Laterality Date  . APPENDECTOMY    . BIOPSY  04/07/2018   Procedure: BIOPSY;  Surgeon: Jerene Bears, MD;  Location: [facility] ENDOSCOPY;  Service: Gastroenterology;;  . BLADDER SURGERY    . CARDIAC CATHETERIZATION N/A 09/26/2015     Procedure: Right/Left Heart Cath and Coronary Angiography;  Surgeon: Burnell Blanks, MD;  Location: [place] CV LAB;  Service: Cardiovascular;  Laterality: N/A;  . CARDIAC SURGERY    . CARDIOVERSION N/A 03/02/2016   Procedure: CARDIOVERSION;  Surgeon: Thayer Headings, MD;  Location: [place];  Service: Cardiovascular;  Laterality: N/A;  . EP IMPLANTABLE DEVICE N/A 10/26/2015   Procedure: Pacemaker Implant;  Surgeon: Will Meredith Leeds, MD;  Location: [place] CV LAB;  Service: Cardiovascular;  Laterality: N/A;  . ESOPHAGOGASTRODUODENOSCOPY (EGD) WITH PROPOFOL N/A 04/07/2018   Procedure: ESOPHAGOGASTRODUODENOSCOPY (EGD) WITH PROPOFOL;  Surgeon: Jerene Bears, MD;  Location: [facility] ENDOSCOPY;  Service: Gastroenterology;  Laterality: N/A;  . ESOPHAGOGASTRODUODENOSCOPY (EGD) WITH PROPOFOL N/A 10/12/2019   Procedure: ESOPHAGOGASTRODUODENOSCOPY (EGD) WITH PROPOFOL;  Surgeon: Danie Binder, MD;  [facility] SU[facility];  Service: Endoscopy;  Laterality: N/A;  . EYE SURGERY Bilateral    cataract surgery  . HOT HEMOSTASIS N/A 04/07/2018   Procedure: HOT HEMOSTASIS (ARGON PLASMA COAGULATION/BICAP);  Surgeon: Jerene Bears, MD;  Location: [facility] ENDOSCOPY;  Service: Gastroenterology;  Laterality: N/A;  . HOT HEMOSTASIS  10/12/2019   Procedure: HOT HEMOSTASIS (ARGON PLASMA COAGULATION/BICAP);  Surgeon: Danie Binder, MD;  Location: [facility] ENDO SUITE;  Service: Endoscopy;;  . NASAL SINUS SURGERY    . PACEMAKER INSERTION    . TEE WITHOUT CARDIOVERSION N/A 10/25/2015   Procedure: TRANSESOPHAGEAL ECHOCARDIOGRAM (TEE);  Surgeon: Burnell Blanks, MD;  Location: [place];  Service: Open Heart Surgery;  Laterality: N/A;  . TRANSCATHETER AORTIC VALVE REPLACEMENT, TRANSFEMORAL Right 10/25/2015   Procedure: TRANSCATHETER AORTIC VALVE REPLACEMENT, TRANSFEMORAL;  Surgeon: Burnell Blanks, MD;  Location: [place];  Service: Open Heart Surgery;  Laterality: Right;     OB History    Gravida  3   Para  3    Term  3   Preterm      AB      Living  2     SAB      TAB      Ectopic      Multiple      Live Births              Family History  Problem Relation Age of Onset  . Hypertension Mother   . Pneumonia Father   . Stomach cancer Brother   . Stroke Brother   . AAA (abdominal aortic aneurysm) Brother   . Cervical cancer Daughter   . Heart attack Neg Hx   . Colon cancer Neg Hx   . Esophageal cancer Neg Hx     Social History   Tobacco Use  . Smoking status: Never Smoker  . Smokeless tobacco: Never Used  Vaping Use  . Vaping Use: Never used  Substance Use Topics  . Alcohol use: No    Alcohol/week: 0.0 standard drinks  . Drug use: No  Home Medications Prior to Admission medications   Medication Sig Start Date End Date Taking? Authorizing Provider  acetaminophen (TYLENOL) 500 MG tablet Take 500 mg by mouth every 6 (six) hours as needed for mild pain or headache.     [provider]  albuterol (PROAIR HFA) 108 (90 Base) MCG/ACT inhaler Inhale 2 puffs into the lungs every 4 (four) hours as needed for wheezing or shortness of breath. 08/05/19   Baruch Gouty, FNP  Biotin 10 MG CAPS Take 10 mg by mouth daily.     [provider]  Budeson-Glycopyrrol-Formoterol (BREZTRI AEROSPHERE) 160-9-4.8 MCG/ACT AERO Inhale 2 Inhalers into the lungs in the morning and at bedtime. 11/27/19   Ivy Lynn, NP  Calcium Carb-Cholecalciferol (CALTRATE 600+D3) 600-800 MG-UNIT TABS Take 1 tablet by mouth daily.    [provider]  cefdinir (OMNICEF) 300 MG capsule Take 1 capsule (300 mg total) by mouth 2 (two) times daily for 5 days. 02/14/20 02/19/20  Barton Dubois, MD  dextromethorphan-guaiFENesin [facility] DM) 30-600 MG 12hr tablet Take 1 tablet by mouth 2 (two) times daily for 7 days. 02/14/20 02/21/20  Barton Dubois, MD  famotidine (PEPCID) 20 MG tablet Take 1 tablet (20 mg total) by mouth at bedtime. 12/21/19 12/20/20  [name], MD  ferrous sulfate 325  (65 FE) MG tablet Take 1 tablet (325 mg total) by mouth 2 (two) times daily with a meal. 04/08/18   Eulogio Bear U, DO  furosemide (LASIX) 20 MG tablet Take 3 tablets (60 mg total) by mouth daily. 12/15/19   Lendon Colonel, NP  levothyroxine (SYNTHROID) 25 MCG tablet Take 1 tablet (25 mcg total) by mouth daily. 12/02/19   [name], MD  memantine (NAMENDA) 10 MG tablet Take 1 tablet (10 mg total) by mouth 2 (two) times daily. 01/04/20   Garvin Fila, MD  metoprolol tartrate (LOPRESSOR) 25 MG tablet Take 1 tablet (25 mg total) by mouth 2 (two) times daily. 05/20/19   Camnitz, Ocie Doyne, MD  multivitamin-iron-minerals-folic acid (CENTRUM) chewable tablet Chew 1 tablet by mouth daily.    [provider]  OVER THE COUNTER MEDICATION Take 0.5 tablets by mouth daily as needed (for allergy). Patient not sure about the name.    [provider]  pantoprazole (PROTONIX) 40 MG tablet Take 1 tablet (40 mg total) by mouth 2 (two) times daily. 02/14/20   Barton Dubois, MD  potassium chloride SA (KLOR-CON) 10 MEQ tablet Take 2 tablets (20 mEq total) by mouth 2 (two) times daily. 02/09/20   [name], MD  predniSONE (DELTASONE) 20 MG tablet Take 2 tablets (40 mg total) by mouth daily with breakfast for 4 days. 02/15/20 02/19/20  Barton Dubois, MD    Allergies    Hctz [hydrochlorothiazide], Aspirin, and Codeine  Review of Systems   Review of Systems  All other systems reviewed and are negative.   Physical Exam Updated Vital Signs BP (!) 108/51 (BP Location: Left Arm)   Pulse 60   Temp 98.3 F (36.8 C) (Oral)   Resp 16   Ht 1.549 m (5\' 1" )   Wt 51.7 kg   SpO2 96%   BMI 21.54 kg/m   Physical Exam Vitals and nursing note reviewed.  Constitutional:      General: She is not in acute distress.    Appearance: She is well-developed.  HENT:     Head: Normocephalic and atraumatic.     Mouth/Throat:     Pharynx: No oropharyngeal exudate.  Eyes:  General: No scleral  icterus.       Right eye: No discharge.        Left eye: No discharge.     Conjunctiva/sclera: Conjunctivae normal.     Pupils: Pupils are equal, round, and reactive to light.  Neck:     Thyroid: No thyromegaly.     Vascular: No JVD.  Cardiovascular:     Rate and Rhythm: Normal rate and regular rhythm.     Heart sounds: Normal heart sounds. No murmur heard.  No friction rub. No gallop.   Pulmonary:     Effort: Pulmonary effort is normal. No respiratory distress.     Breath sounds: Normal breath sounds. No wheezing or rales.  Abdominal:     General: Bowel sounds are normal. There is no distension.     Palpations: Abdomen is soft. There is no mass.     Tenderness: There is no abdominal tenderness.  Genitourinary:    Comments: Chaperone present for exam, nurse at bedside.  The patient has a normal-appearing anus without any signs of hemorrhoids or fissures.  There is no masses in the rectal vault, it is full of melanotic appearing stool. Musculoskeletal:        General: No tenderness. Normal range of motion.     Cervical back: Normal range of motion and neck supple.  Lymphadenopathy:     Cervical: No cervical adenopathy.  Skin:    General: Skin is warm and dry.     Findings: No erythema or rash.  Neurological:     Mental Status: She is alert.     Coordination: Coordination normal.  Psychiatric:        Behavior: Behavior normal.     ED Results / Procedures / Treatments   Labs (all labs ordered are listed, but only abnormal results are displayed) Labs Reviewed  CBC WITH DIFFERENTIAL/PLATELET - Abnormal; Notable for the following components:      Result Value   RBC 2.90 (*)    Hemoglobin 9.1 (*)    HCT 28.8 (*)    Lymphs Abs 0.5 (*)    All other components within normal limits  BASIC METABOLIC PANEL - Abnormal; Notable for the following components:   Sodium 134 (*)    Chloride 97 (*)    Glucose, Bld 157 (*)    BUN 40 (*)    Creatinine, Ser 1.88 (*)    Calcium 8.7 (*)      GFR calc non Af Amer 23 (*)    GFR calc Af Amer 27 (*)    All other components within normal limits  POC OCCULT BLOOD, ED - Abnormal; Notable for the following components:   Fecal Occult Bld POSITIVE (*)    All other components within normal limits  SARS CORONAVIRUS 2 BY RT PCR (HOSPITAL ORDER, [place] [facility])  TYPE AND SCREEN    EKG None  Radiology No results found.  Procedures Procedures (including critical care time)  Medications Ordered in ED Medications  pantoprazole (PROTONIX) injection 40 mg (has no administration in time range)  famotidine (PEPCID) IVPB 20 mg premix (has no administration in time range)    ED Course  I have reviewed the triage vital signs and the nursing notes.  Pertinent labs & imaging results that were available during my care of the patient were reviewed by me and considered in my medical decision making (see chart for details).    MDM Rules/Calculators/A&P  The patient has an essentially nontender abdomen, she has melena on exam, she has a gram and 1/2 drop in her hemoglobin and has known bleeding areas of her stomach in the past.  She will need to be admitted to the hospital, she will need Protonix and Pepcid, will discuss her care with the hospitalist.  Vital signs are unremarkable thankfully she is not tachycardic or hypotensive.  Family members at the bedside and both the family member the patient are in agreement with the plan.  Type and screen has been ordered  Discussed the care with Dr. Claria Dice who is been kind enough to admit the patient to the hospital.  I do not think that the patient needs emergent GI consultation and she is hemodynamically stable  Final Clinical Impression(s) / ED Diagnoses Final diagnoses:  Gastrointestinal bleeding, upper      Noemi Chapel, MD 02/18/20 2324

## 2020-02-18 NOTE — H&P (Signed)
PCP:   Crystal Fraise, MD   Chief Complaint:  Rectal bleed  HPI: This is a wonderful 86 year old female who presents after she has had 3 BMs with dark red tarry stools.  Per patient she had infection she was prescribed antibiotics.  She states this has started her bowels moving.  She called her PCP who recommended something that did not work.  She states the symptoms only worsened.  She states today she had nothing but bloody stools.  Again the PCP was called and she was directed to go to the ER.  She actually feels a bit weaker today.  She states her tummy has not been hurting but today she did have some mild discomfort in the lower quadrants.  This is since resolved.  She denies any nausea or vomiting.  She denies any chest pains.  Per ER physician patient is occult stool positive.  And he noted dark red stool in her rectaldl vault.  The patient has a history of GI bleed.  She was admitted here in April.  4/21 she had an EGD which showed small angiectasia's.  She also had erosive gastropathy.  She was on Eliquis for atrial fibrillation.  Patient also is S/P TAVR.  Her Eliquis was discontinued.  Despite being off Eliquis she now has lower GI bleed.  History read by the patient who is alert and oriented but quite hard of hearing, as well after chart review  Review of Systems:  The patient denies anorexia, fever, weight loss,, vision loss, decreased hearing, hoarseness, chest pain, syncope, dyspnea on exertion, peripheral edema, balance deficits, hemoptysis, hematochezia, severe indigestion/heartburn, hematuria, incontinence, genital sores, muscle weakness, suspicious skin lesions, transient blindness, difficulty walking, depression, unusual weight change, abnormal bleeding, enlarged lymph nodes, angioedema, and breast masses. Positives: GI bleed, weakness, hard of hearing  Past Medical History: Past Medical History:  Diagnosis Date  . AKI (acute kidney injury) ([place])   . Anemia    years ago  .  Aortic stenosis   . Arthritis   . Asthma   . Atrial fibrillation ([place])   . CHF (congestive heart failure) ([place]) 11/2014  . CKD (chronic kidney disease)   . Dementia ([place])   . Family history of adverse reaction to anesthesia    2 daughters would have N/V  . Gastric AVM   . GI bleed   . Glaucoma   . Hearing loss   . Heart murmur   . HTN (hypertension)   . Hypokalemia   . Pneumonia   . Prolapsing mitral leaflet syndrome   . Shortness of breath dyspnea    with exertion  . Stroke ([place])   . SVT (supraventricular tachycardia) ([facility])    S/P ablation of AVNRT in 2003   Past Surgical History:  Procedure Laterality Date  . APPENDECTOMY    . BIOPSY  04/07/2018   Procedure: BIOPSY;  Surgeon: Jerene Bears, MD;  Location: [facility] ENDOSCOPY;  Service: Gastroenterology;;  . BLADDER SURGERY    . CARDIAC CATHETERIZATION N/A 09/26/2015   Procedure: Right/Left Heart Cath and Coronary Angiography;  Surgeon: Burnell Blanks, MD;  Location: [place] CV LAB;  Service: Cardiovascular;  Laterality: N/A;  . CARDIAC SURGERY    . CARDIOVERSION N/A 03/02/2016   Procedure: CARDIOVERSION;  Surgeon: Thayer Headings, MD;  Location: [place];  Service: Cardiovascular;  Laterality: N/A;  . EP IMPLANTABLE DEVICE N/A 10/26/2015   Procedure: Pacemaker Implant;  Surgeon: Will Meredith Leeds, MD;  Location: [place] CV LAB;  Service: Cardiovascular;  Laterality: N/A;  . ESOPHAGOGASTRODUODENOSCOPY (EGD) WITH PROPOFOL N/A 04/07/2018   Procedure: ESOPHAGOGASTRODUODENOSCOPY (EGD) WITH PROPOFOL;  Surgeon: Jerene Bears, MD;  Location: [facility] ENDOSCOPY;  Service: Gastroenterology;  Laterality: N/A;  . ESOPHAGOGASTRODUODENOSCOPY (EGD) WITH PROPOFOL N/A 10/12/2019   Procedure: ESOPHAGOGASTRODUODENOSCOPY (EGD) WITH PROPOFOL;  Surgeon: Danie Binder, MD;  [facility];  Service: Endoscopy;  Laterality: N/A;  . EYE SURGERY Bilateral    cataract surgery  . HOT HEMOSTASIS N/A 04/07/2018   Procedure: HOT HEMOSTASIS  (ARGON PLASMA COAGULATION/BICAP);  Surgeon: Jerene Bears, MD;  Location: [facility] ENDOSCOPY;  Service: Gastroenterology;  Laterality: N/A;  . HOT HEMOSTASIS  10/12/2019   Procedure: HOT HEMOSTASIS (ARGON PLASMA COAGULATION/BICAP);  Surgeon: Danie Binder, MD;  Location: [facility];  Service: Endoscopy;;  . NASAL SINUS SURGERY    . PACEMAKER INSERTION    . TEE WITHOUT CARDIOVERSION N/A 10/25/2015   Procedure: TRANSESOPHAGEAL ECHOCARDIOGRAM (TEE);  Surgeon: Burnell Blanks, MD;  Location: [place];  Service: Open Heart Surgery;  Laterality: N/A;  . TRANSCATHETER AORTIC VALVE REPLACEMENT, TRANSFEMORAL Right 10/25/2015   Procedure: TRANSCATHETER AORTIC VALVE REPLACEMENT, TRANSFEMORAL;  Surgeon: Burnell Blanks, MD;  Location: [place];  Service: Open Heart Surgery;  Laterality: Right;    Medications: Prior to Admission medications   Medication Sig Start Date End Date Taking? Authorizing Provider  acetaminophen (TYLENOL) 500 MG tablet Take 500 mg by mouth every 6 (six) hours as needed for mild pain or headache.     [provider]  albuterol (PROAIR HFA) 108 (90 Base) MCG/ACT inhaler Inhale 2 puffs into the lungs every 4 (four) hours as needed for wheezing or shortness of breath. 08/05/19   Baruch Gouty, FNP  Biotin 10 MG CAPS Take 10 mg by mouth daily.     [provider]  Budeson-Glycopyrrol-Formoterol (BREZTRI AEROSPHERE) 160-9-4.8 MCG/ACT AERO Inhale 2 Inhalers into the lungs in the morning and at bedtime. 11/27/19   Ivy Lynn, NP  Calcium Carb-Cholecalciferol (CALTRATE 600+D3) 600-800 MG-UNIT TABS Take 1 tablet by mouth daily.    [provider]  cefdinir (OMNICEF) 300 MG capsule Take 1 capsule (300 mg total) by mouth 2 (two) times daily for 5 days. 02/14/20 02/19/20  Barton Dubois, MD  dextromethorphan-guaiFENesin [facility] DM) 30-600 MG 12hr tablet Take 1 tablet by mouth 2 (two) times daily for 7 days. 02/14/20 02/21/20  Barton Dubois, MD  famotidine (PEPCID)  20 MG tablet Take 1 tablet (20 mg total) by mouth at bedtime. 12/21/19 12/20/20  Crystal Fraise, MD  ferrous sulfate 325 (65 FE) MG tablet Take 1 tablet (325 mg total) by mouth 2 (two) times daily with a meal. 04/08/18   Eulogio Bear U, DO  furosemide (LASIX) 20 MG tablet Take 3 tablets (60 mg total) by mouth daily. 12/15/19   Lendon Colonel, NP  levothyroxine (SYNTHROID) 25 MCG tablet Take 1 tablet (25 mcg total) by mouth daily. 12/02/19   Crystal Fraise, MD  memantine (NAMENDA) 10 MG tablet Take 1 tablet (10 mg total) by mouth 2 (two) times daily. 01/04/20   Garvin Fila, MD  metoprolol tartrate (LOPRESSOR) 25 MG tablet Take 1 tablet (25 mg total) by mouth 2 (two) times daily. 05/20/19   Camnitz, Ocie Doyne, MD  multivitamin-iron-minerals-folic acid (CENTRUM) chewable tablet Chew 1 tablet by mouth daily.    [provider]  OVER THE COUNTER MEDICATION Take 0.5 tablets by mouth daily as needed (for allergy). Patient not sure about the  name.    [provider]  pantoprazole (PROTONIX) 40 MG tablet Take 1 tablet (40 mg total) by mouth 2 (two) times daily. 02/14/20   Barton Dubois, MD  potassium chloride SA (KLOR-CON) 10 MEQ tablet Take 2 tablets (20 mEq total) by mouth 2 (two) times daily. 02/09/20   Crystal Fraise, MD  predniSONE (DELTASONE) 20 MG tablet Take 2 tablets (40 mg total) by mouth daily with breakfast for 4 days. 02/15/20 02/19/20  Barton Dubois, MD    Allergies:   Allergies  Allergen Reactions  . Hctz [Hydrochlorothiazide] Other (See Comments)    Pt was ill and this affected her kidneys   . Aspirin Other (See Comments)    Cardiologist said the patient is to not take this  . Codeine Other (See Comments)    Made the patient feel ill, has not had any problems since 1977    Social History:  reports that she has never smoked. She has never used smokeless tobacco. She reports that she does not drink alcohol and does not use drugs.  Family History: Family  History  Problem Relation Age of Onset  . Hypertension Mother   . Pneumonia Father   . Stomach cancer Brother   . Stroke Brother   . AAA (abdominal aortic aneurysm) Brother   . Cervical cancer Daughter   . Heart attack Neg Hx   . Colon cancer Neg Hx   . Esophageal cancer Neg Hx     Physical Exam: Vitals:   02/18/20 1852 02/18/20 1853  BP:  (!) 108/51  Pulse:  60  Resp:  16  Temp:  98.3 F (36.8 C)  TempSrc:  Oral  SpO2:  96%  Weight: 51.7 kg   Height: 5\' 1"  (1.549 m)     General:  Alert and oriented times three, well developed and nourished, no acute distress Eyes: PERRLA, pink conjunctiva, no scleral icterus ENT: Moist oral mucosa, neck supple, no thyromegaly Lungs: clear to ascultation, no wheeze, no crackles, no use of accessory muscles Cardiovascular: regular rate and rhythm, no regurgitation, no gallops, no murmurs. No carotid bruits, no JVD Abdomen: soft, positive BS, non-tender, non-distended, no organomegaly, not an acute abdomen GU: not examined Neuro: CN II - XII grossly intact, sensation intact Musculoskeletal: strength 5/5 all extremities, no clubbing, cyanosis or edema Skin: no rash, no subcutaneous crepitation, no decubitus Psych: appropriate patient   Labs on Admission:  Recent Labs    02/16/20 1338 02/18/20 2123  NA 131* 134*  K 4.6 4.4  CL 96* 97*  CO2 26 27  GLUCOSE 122* 157*  BUN 23 40*  CREATININE 1.62* 1.88*  CALCIUM 9.2 8.7*   Recent Labs    02/16/20 1338  AST 46*  ALT 47*  ALKPHOS 79  BILITOT 0.7  PROT 6.5  ALBUMIN 3.7   No results for input(s): LIPASE, AMYLASE in the last 72 hours. Recent Labs    02/16/20 1338 02/18/20 2123  WBC 8.3 6.4  NEUTROABS 7.5 5.6  HGB 9.7* 9.1*  HCT 30.9* 28.8*  MCV 98.7 99.3  PLT 210 225   No results for input(s): CKTOTAL, CKMB, CKMBINDEX, TROPONINI in the last 72 hours. Invalid input(s): POCBNP No results for input(s): DDIMER in the last 72 hours. No results for input(s): HGBA1C in the  last 72 hours. No results for input(s): CHOL, HDL, LDLCALC, TRIG, CHOLHDL, LDLDIRECT in the last 72 hours. No results for input(s): TSH, T4TOTAL, T3FREE, THYROIDAB in the last 72 hours.  Invalid input(s): FREET3 Recent  Labs    02/16/20 1338  FERRITIN 87  TIBC 321  IRON 118    Micro Results: Recent Results (from the past 240 hour(s))  Urine Culture     Status: Abnormal   Collection Time: 02/09/20  2:00 PM   Specimen: Urine   UR  Result Value Ref Range Status   Urine Culture, Routine Final report (A)  Final   Organism ID, Bacteria Escherichia coli (A)  Final    Comment: Cefazolin <=4 ug/mL Cefazolin with an MIC <=16 predicts susceptibility to the oral agents cefaclor, cefdinir, cefpodoxime, cefprozil, cefuroxime, cephalexin, and loracarbef when used for therapy of uncomplicated urinary tract infections due to E. coli, Klebsiella pneumoniae, and Proteus mirabilis. Greater than 100,000 colony forming units per mL    Antimicrobial Susceptibility Comment  Final    Comment:       ** S = Susceptible; I = Intermediate; R = Resistant **                    P = Positive; N = Negative             MICS are expressed in micrograms per mL    Antibiotic                 RSLT#1    RSLT#2    RSLT#3    RSLT#4 Amoxicillin/Clavulanic Acid    S Ampicillin                     S Cefepime                       S Ceftriaxone                    S Cefuroxime                     S Ciprofloxacin                  S Ertapenem                      S Gentamicin                     S Imipenem                       S Levofloxacin                   S Meropenem                      S Nitrofurantoin                 S Piperacillin/Tazobactam        S Tetracycline                   S Tobramycin                     S Trimethoprim/Sulfa             S   Microscopic Examination     Status: Abnormal   Collection Time: 02/09/20  2:00 PM   Urine  Result Value Ref Range Status   WBC, UA 6-10 (A) 0 - 5 /hpf  Final   RBC None seen 0 - 2 /hpf Final   Epithelial Cells (non renal) 0-10  0 - 10 /hpf Final   Bacteria, UA Moderate (A) None seen/Few Final  SARS Coronavirus 2 by RT PCR (hospital order, performed in [facility] hospital lab) Nasopharyngeal Nasopharyngeal Swab     Status: None   Collection Time: 02/12/20  7:57 AM   Specimen: Nasopharyngeal Swab  Result Value Ref Range Status   SARS Coronavirus 2 NEGATIVE NEGATIVE Final    Comment: (NOTE) SARS-CoV-2 target nucleic acids are NOT DETECTED.  The SARS-CoV-2 RNA is generally detectable in upper and lower respiratory specimens during the acute phase of infection. The lowest concentration of SARS-CoV-2 viral copies this assay can detect is 250 copies / mL. A negative result does not preclude SARS-CoV-2 infection and should not be used as the sole basis for treatment or other patient management decisions.  A negative result may occur with improper specimen collection / handling, submission of specimen other than nasopharyngeal swab, presence of viral mutation(s) within the areas targeted by this assay, and inadequate number of viral copies (<250 copies / mL). A negative result must be combined with clinical observations, patient history, and epidemiological information.  Fact Sheet for Patients:   StrictlyIdeas.no  Fact Sheet for Healthcare Providers: BankingDealers.co.za  This test is not yet approved or  cleared by the [place] FDA and has been authorized for detection and/or diagnosis of SARS-CoV-2 by FDA under an Emergency Use Authorization (EUA).  This EUA will remain in effect (meaning this test can be used) for the duration of the COVID-19 declaration under Section 564(b)(1) of the Act, 21 U.S.C. section 360bbb-3(b)(1), unless the authorization is terminated or revoked sooner.  Performed at [facility], [address], [place], [place] [address]   Culture, blood (routine x 2)  Call MD if unable to obtain prior to antibiotics being given     Status: None   Collection Time: 02/12/20 11:03 AM   Specimen: Right Antecubital; Blood  Result Value Ref Range Status   Specimen Description RIGHT ANTECUBITAL  Final   Special Requests   Final    BOTTLES DRAWN AEROBIC AND ANAEROBIC Blood Culture adequate volume   Culture   Final    NO GROWTH 5 DAYS Performed at [facility], [address] [place], [place] [address]    Report Status 02/17/2020 FINAL  Final  Culture, blood (routine x 2) Call MD if unable to obtain prior to antibiotics being given     Status: None   Collection Time: 02/12/20 11:04 AM   Specimen: BLOOD RIGHT WRIST  Result Value Ref Range Status   Specimen Description BLOOD RIGHT WRIST  Final   Special Requests   Final    BOTTLES DRAWN AEROBIC AND ANAEROBIC Blood Culture adequate volume   Culture   Final    NO GROWTH 5 DAYS Performed at [facility], [address], [place], [place] [address]    Report Status 02/17/2020 FINAL  Final  Urine culture     Status: None   Collection Time: 02/12/20  1:53 PM   Specimen: Urine, Clean Catch  Result Value Ref Range Status   Specimen Description   Final    URINE, CLEAN CATCH Performed at [facility], [address], [place], [place] [address]    Special Requests   Final    NONE Performed at [facility], [address], [place], [place] [address]    Culture   Final    NO GROWTH Performed at [facility], [place] [address], [place], [place] [address]    Report  Status 02/13/2020 FINAL  Final     Radiological Exams on Admission: No results found.  Assessment/Plan Present on Admission: . GI bleed . Iron deficiency anemia due to chronic blood loss AVM -Bring in for overnight observation med telemetry -N.p.o., gentle IV fluid hydration -Serial H&H -Anemia panel ordered -IV Protonix twice daily and p.o. Pepcid -Transfuse as needed -GI consult per a.m. team  . Essential hypertension -Stable, home  meds resumed  . PAF (paroxysmal atrial fibrillation) ([place]) . Pacemaker -Anticoagulation still on hold -Other home meds continued  COPD -Stable, home meds resumed  Dementia -Stable, home meds resumed  History of CVA -   Crystal Cooper 02/18/2020, 11:48 PM

## 2020-02-18 NOTE — Telephone Encounter (Signed)
[place] daughter of Ms. Rody called back to check message left about passing blood in her stools over the last 24 hours. Patient is getting weaker. Please to advise.

## 2020-02-19 ENCOUNTER — Encounter (HOSPITAL_COMMUNITY)
Admission: EM | Disposition: A | Discharge status: Home / Self Care | Payer: Self-pay | Source: Home / Self Care | Attending: Internal Medicine

## 2020-02-19 ENCOUNTER — Observation Stay (HOSPITAL_COMMUNITY): Payer: Medicare Other | Admitting: Anesthesiology

## 2020-02-19 ENCOUNTER — Encounter (HOSPITAL_COMMUNITY): Payer: Self-pay | Admitting: Family Medicine

## 2020-02-19 DIAGNOSIS — R001 Bradycardia, unspecified: Secondary | ICD-10-CM | POA: Diagnosis present

## 2020-02-19 DIAGNOSIS — D62 Acute posthemorrhagic anemia: Secondary | ICD-10-CM | POA: Diagnosis not present

## 2020-02-19 DIAGNOSIS — F0280 Dementia in other diseases classified elsewhere without behavioral disturbance: Secondary | ICD-10-CM

## 2020-02-19 DIAGNOSIS — G309 Alzheimer's disease, unspecified: Secondary | ICD-10-CM | POA: Diagnosis not present

## 2020-02-19 DIAGNOSIS — Z79899 Other long term (current) drug therapy: Secondary | ICD-10-CM | POA: Diagnosis not present

## 2020-02-19 DIAGNOSIS — F0390 Unspecified dementia without behavioral disturbance: Secondary | ICD-10-CM | POA: Diagnosis not present

## 2020-02-19 DIAGNOSIS — Q2730 Arteriovenous malformation, site unspecified: Secondary | ICD-10-CM

## 2020-02-19 DIAGNOSIS — Z8249 Family history of ischemic heart disease and other diseases of the circulatory system: Secondary | ICD-10-CM | POA: Diagnosis not present

## 2020-02-19 DIAGNOSIS — K254 Chronic or unspecified gastric ulcer with hemorrhage: Secondary | ICD-10-CM | POA: Diagnosis not present

## 2020-02-19 DIAGNOSIS — I480 Paroxysmal atrial fibrillation: Secondary | ICD-10-CM | POA: Diagnosis not present

## 2020-02-19 DIAGNOSIS — N1832 Chronic kidney disease, stage 3b: Secondary | ICD-10-CM | POA: Diagnosis not present

## 2020-02-19 DIAGNOSIS — K922 Gastrointestinal hemorrhage, unspecified: Secondary | ICD-10-CM | POA: Diagnosis not present

## 2020-02-19 DIAGNOSIS — I130 Hypertensive heart and chronic kidney disease with heart failure and stage 1 through stage 4 chronic kidney disease, or unspecified chronic kidney disease: Secondary | ICD-10-CM | POA: Diagnosis not present

## 2020-02-19 DIAGNOSIS — Z952 Presence of prosthetic heart valve: Secondary | ICD-10-CM | POA: Diagnosis not present

## 2020-02-19 DIAGNOSIS — I509 Heart failure, unspecified: Secondary | ICD-10-CM | POA: Diagnosis not present

## 2020-02-19 DIAGNOSIS — J439 Emphysema, unspecified: Secondary | ICD-10-CM

## 2020-02-19 DIAGNOSIS — K3189 Other diseases of stomach and duodenum: Secondary | ICD-10-CM | POA: Diagnosis not present

## 2020-02-19 DIAGNOSIS — D500 Iron deficiency anemia secondary to blood loss (chronic): Secondary | ICD-10-CM | POA: Diagnosis not present

## 2020-02-19 DIAGNOSIS — Z950 Presence of cardiac pacemaker: Secondary | ICD-10-CM | POA: Diagnosis not present

## 2020-02-19 DIAGNOSIS — K921 Melena: Secondary | ICD-10-CM | POA: Diagnosis present

## 2020-02-19 DIAGNOSIS — E039 Hypothyroidism, unspecified: Secondary | ICD-10-CM | POA: Diagnosis not present

## 2020-02-19 DIAGNOSIS — Z20822 Contact with and (suspected) exposure to covid-19: Secondary | ICD-10-CM | POA: Diagnosis not present

## 2020-02-19 DIAGNOSIS — N183 Chronic kidney disease, stage 3 unspecified: Secondary | ICD-10-CM

## 2020-02-19 DIAGNOSIS — H9190 Unspecified hearing loss, unspecified ear: Secondary | ICD-10-CM | POA: Diagnosis present

## 2020-02-19 DIAGNOSIS — I10 Essential (primary) hypertension: Secondary | ICD-10-CM

## 2020-02-19 DIAGNOSIS — K911 Postgastric surgery syndromes: Secondary | ICD-10-CM | POA: Diagnosis not present

## 2020-02-19 DIAGNOSIS — I4819 Other persistent atrial fibrillation: Secondary | ICD-10-CM | POA: Diagnosis not present

## 2020-02-19 DIAGNOSIS — K2950 Unspecified chronic gastritis without bleeding: Secondary | ICD-10-CM | POA: Diagnosis not present

## 2020-02-19 DIAGNOSIS — Z79890 Hormone replacement therapy (postmenopausal): Secondary | ICD-10-CM | POA: Diagnosis not present

## 2020-02-19 DIAGNOSIS — I110 Hypertensive heart disease with heart failure: Secondary | ICD-10-CM | POA: Diagnosis not present

## 2020-02-19 DIAGNOSIS — J449 Chronic obstructive pulmonary disease, unspecified: Secondary | ICD-10-CM | POA: Diagnosis not present

## 2020-02-19 DIAGNOSIS — F0150 Vascular dementia without behavioral disturbance: Secondary | ICD-10-CM | POA: Diagnosis present

## 2020-02-19 DIAGNOSIS — Z8673 Personal history of transient ischemic attack (TIA), and cerebral infarction without residual deficits: Secondary | ICD-10-CM | POA: Diagnosis not present

## 2020-02-19 DIAGNOSIS — I5032 Chronic diastolic (congestive) heart failure: Secondary | ICD-10-CM | POA: Diagnosis present

## 2020-02-19 HISTORY — PX: BIOPSY: SHX5522

## 2020-02-19 HISTORY — PX: ENTEROSCOPY: SHX5533

## 2020-02-19 HISTORY — PX: FLEXIBLE SIGMOIDOSCOPY: SHX5431

## 2020-02-19 LAB — BASIC METABOLIC PANEL
Anion gap: 5 (ref 5–15)
BUN: 34 mg/dL — ABNORMAL HIGH (ref 8–23)
CO2: 28 mmol/L (ref 22–32)
Calcium: 8.6 mg/dL — ABNORMAL LOW (ref 8.9–10.3)
Chloride: 102 mmol/L (ref 98–111)
Creatinine, Ser: 1.58 mg/dL — ABNORMAL HIGH (ref 0.44–1.00)
GFR calc Af Amer: 33 mL/min — ABNORMAL LOW (ref >60)
GFR calc non Af Amer: 28 mL/min — ABNORMAL LOW (ref >60)
Glucose, Bld: 100 mg/dL — ABNORMAL HIGH (ref 70–99)
Potassium: 4.2 mmol/L (ref 3.5–5.1)
Sodium: 135 mmol/L (ref 135–145)

## 2020-02-19 LAB — PROTIME-INR
INR: 1.3 — ABNORMAL HIGH (ref 0.8–1.2)
Prothrombin Time: 15.3 seconds — ABNORMAL HIGH (ref 11.4–15.2)

## 2020-02-19 LAB — HEMOGLOBIN AND HEMATOCRIT, BLOOD
HCT: 26.1 % — ABNORMAL LOW (ref 36.0–46.0)
HCT: 25.4 % — ABNORMAL LOW (ref 36.0–46.0)
HCT: 27.9 % — ABNORMAL LOW (ref 36.0–46.0)
Hemoglobin: 8.1 g/dL — ABNORMAL LOW (ref 12.0–15.0)
Hemoglobin: 7.9 g/dL — ABNORMAL LOW (ref 12.0–15.0)
Hemoglobin: 8.7 g/dL — ABNORMAL LOW (ref 12.0–15.0)

## 2020-02-19 LAB — SARS CORONAVIRUS 2 BY RT PCR (HOSPITAL ORDER, PERFORMED IN ~~LOC~~ HOSPITAL LAB): SARS Coronavirus 2: NEGATIVE

## 2020-02-19 SURGERY — SIGMOIDOSCOPY, FLEXIBLE
Anesthesia: General

## 2020-02-19 SURGERY — SIGMOIDOSCOPY, FLEXIBLE
Anesthesia: Monitor Anesthesia Care

## 2020-02-19 MED ORDER — ONDANSETRON HCL 4 MG/2ML IJ SOLN: 4.0000 mg | Freq: Four times a day (QID) | INTRAMUSCULAR | Status: DC | PRN

## 2020-02-19 MED ORDER — ACETAMINOPHEN 650 MG RE SUPP: 650.0000 mg | Freq: Four times a day (QID) | RECTAL | Status: DC | PRN

## 2020-02-19 MED ORDER — LIDOCAINE HCL (CARDIAC) PF 50 MG/5ML IV SOSY
PREFILLED_SYRINGE | INTRAVENOUS | Status: DC | PRN
  Administered 2020-02-19: 80 mg via INTRAVENOUS

## 2020-02-19 MED ORDER — HYDRALAZINE HCL 20 MG/ML IJ SOLN: 5.0000 mg | INTRAMUSCULAR | Status: DC | PRN

## 2020-02-19 MED ORDER — LIDOCAINE VISCOUS HCL 2 % MT SOLN
OROMUCOSAL | Status: AC
  Filled 2020-02-19: qty 15

## 2020-02-19 MED ORDER — ONDANSETRON HCL 4 MG PO TABS: 4.0000 mg | ORAL_TABLET | Freq: Four times a day (QID) | ORAL | Status: DC | PRN

## 2020-02-19 MED ORDER — STERILE WATER FOR IRRIGATION IR SOLN
Status: DC | PRN
  Administered 2020-02-19: 1.5 mL

## 2020-02-19 MED ORDER — LACTATED RINGERS IV SOLN
INTRAVENOUS | Status: DC
  Administered 2020-02-19: 1000 mL via INTRAVENOUS

## 2020-02-19 MED ORDER — ACETAMINOPHEN 325 MG PO TABS: 650.0000 mg | ORAL_TABLET | Freq: Four times a day (QID) | ORAL | Status: DC | PRN

## 2020-02-19 MED ORDER — PHENYLEPHRINE HCL (PRESSORS) 10 MG/ML IV SOLN
INTRAVENOUS | Status: DC | PRN
  Administered 2020-02-19: 40 ug via INTRAVENOUS

## 2020-02-19 MED ORDER — METOPROLOL TARTRATE 25 MG PO TABS
25.0000 mg | ORAL_TABLET | Freq: Two times a day (BID) | ORAL | Status: DC
  Administered 2020-02-19 – 2020-02-21 (×4): 25 mg via ORAL
  Filled 2020-02-19 (×5): qty 1

## 2020-02-19 MED ORDER — PROPOFOL 10 MG/ML IV BOLUS
INTRAVENOUS | Status: DC | PRN
  Administered 2020-02-19: 70 mg via INTRAVENOUS
  Administered 2020-02-19 (×3): 20 mg via INTRAVENOUS
  Administered 2020-02-19: 40 mg via INTRAVENOUS
  Administered 2020-02-19: 50 mg via INTRAVENOUS

## 2020-02-19 MED ORDER — PANTOPRAZOLE SODIUM 40 MG IV SOLR
40.0000 mg | Freq: Two times a day (BID) | INTRAVENOUS | Status: DC
  Administered 2020-02-19 – 2020-02-21 (×4): 40 mg via INTRAVENOUS
  Filled 2020-02-19 (×4): qty 40

## 2020-02-19 MED ORDER — LIDOCAINE VISCOUS HCL 2 % MT SOLN
15.0000 mL | Freq: Once | OROMUCOSAL | Status: AC
  Administered 2020-02-19: 15 mL via OROMUCOSAL

## 2020-02-19 MED ORDER — FAMOTIDINE IN NACL 20-0.9 MG/50ML-% IV SOLN: 20.0000 mg | Freq: Two times a day (BID) | INTRAVENOUS | Status: DC

## 2020-02-19 MED ORDER — SODIUM CHLORIDE 0.9 % IV SOLN: INTRAVENOUS | Status: DC

## 2020-02-19 MED ORDER — PROPOFOL 500 MG/50ML IV EMUL
INTRAVENOUS | Status: DC | PRN
  Administered 2020-02-19: 50 ug/kg/min via INTRAVENOUS

## 2020-02-19 MED ORDER — LEVOTHYROXINE SODIUM 25 MCG PO TABS
25.0000 ug | ORAL_TABLET | Freq: Every day | ORAL | Status: DC
  Administered 2020-02-20 – 2020-02-21 (×2): 25 ug via ORAL
  Filled 2020-02-19 (×2): qty 1

## 2020-02-19 MED ORDER — FAMOTIDINE IN NACL 20-0.9 MG/50ML-% IV SOLN
20.0000 mg | INTRAVENOUS | Status: DC
  Administered 2020-02-20 – 2020-02-21 (×2): 20 mg via INTRAVENOUS
  Filled 2020-02-19 (×3): qty 50

## 2020-02-19 MED ORDER — MEMANTINE HCL 10 MG PO TABS
10.0000 mg | ORAL_TABLET | Freq: Two times a day (BID) | ORAL | Status: DC
  Administered 2020-02-19: 10 mg via ORAL
  Filled 2020-02-19: qty 1

## 2020-02-19 MED ORDER — MEMANTINE HCL 10 MG PO TABS
10.0000 mg | ORAL_TABLET | Freq: Two times a day (BID) | ORAL | Status: DC
  Administered 2020-02-20 – 2020-02-21 (×3): 10 mg via ORAL
  Filled 2020-02-19 (×3): qty 1

## 2020-02-19 MED ORDER — GLYCOPYRROLATE 0.2 MG/ML IJ SOLN: 0.2000 mg | Freq: Once | INTRAMUSCULAR | Status: DC

## 2020-02-19 NOTE — ED Notes (Signed)
Pt currently on the bedpan

## 2020-02-19 NOTE — Consult Note (Addendum)
Maylon Peppers, M.D. Gastroenterology & Hepatology                                           Patient Name: Crystal Cooper Account #: _0 @   MRN: [id number] Admission Date: 02/18/2020 Date of Evaluation:  02/19/2020 Time of Evaluation: 12:16 PM   Referring Physician: Orson Eva, MD  Chief Complaint:  Melena and rectal bleeding  HPI:  This is a 86 year old female with past medical history of supraventricular tachycardia status post ablation, stroke, history of duodenal AVMs, chronic renal disease stage III, history of COPD, GERD, dementia, who comes to the hospital after presenting melena and rectal bleeding.  Patient is a poor historian. I obtained the information from her daughter Crystal Cooper. The patient and daughter report that she has had recurrent episodes of melena since she was discharged from the hospital last Sunday. She noticed that for the last two days she was presenting rectal bleeding since yesterday in multiple episodes. She  believes she has seen some melena mixed with it. Due to this, she called her PCP who directed her to the ER. She has felt fatigued but no lightheadedness or syncope. She endorsed having some mild lower abdominal discomfort yesterday but not today. Patient denies any nausea, vomiting, fever, chills, hematemesis, abdominal distention, jaundice, weight loss, pruritus.  Notably, the patient had a prior hospitalization on April 2021 due to similar presentation of melena. At that time she underwent an EGD was found to have small AVMs in the duodenum which were ablated with APC. At that time she was on Eliquis but she has been off the medication since then. She had a TAVR as well for this.  Also, the patient was recently hospitalized after presenting an episode of hospital-acquired pneumonia and COPD exacerbation. She required antibiotic treatment for this.Importantly, she takes protonix once a day usually but was discharged on BID dosing during her most recent  hospitalization.  In the ED, she was HD stable but with borderline BP rest WNL and afebrile. Labs were remarkable for hemoglobin of 9.1 with MCV 99.3, normal level cell count and platelets of 225, INR was 1.3, CMP showed mild elevation of her aminotransferases with AST 46, ALT 47, elevation of her creatinine 1.62, with initial BUN normal of 23 but increased to 40 is subsequent blood draw.   Past Medical History: SEE CHRONIC ISSSUES: Past Medical History:  Diagnosis Date  . AKI (acute kidney injury) ([place])   . Anemia    years ago  . Aortic stenosis   . Arthritis   . Asthma   . Atrial fibrillation ([place])   . CHF (congestive heart failure) ([place]) 11/2014  . CKD (chronic kidney disease)   . Dementia ([place])   . Family history of adverse reaction to anesthesia    2 daughters would have N/V  . Gastric AVM   . GI bleed   . Glaucoma   . Hearing loss   . Heart murmur   . HTN (hypertension)   . Hypokalemia   . Pneumonia   . Prolapsing mitral leaflet syndrome   . Shortness of breath dyspnea    with exertion  . Stroke ([place])   . SVT (supraventricular tachycardia) ([facility])    S/P ablation of AVNRT in 2003   Past Surgical History:  Past Surgical History:  Procedure Laterality Date  . APPENDECTOMY    . BIOPSY  04/07/2018   Procedure: BIOPSY;  Surgeon: Jerene Bears, MD;  Location: [facility] ENDOSCOPY;  Service: Gastroenterology;;  . BLADDER SURGERY    . CARDIAC CATHETERIZATION N/A 09/26/2015   Procedure: Right/Left Heart Cath and Coronary Angiography;  Surgeon: Burnell Blanks, MD;  Location: [place] CV LAB;  Service: Cardiovascular;  Laterality: N/A;  . CARDIAC SURGERY    . CARDIOVERSION N/A 03/02/2016   Procedure: CARDIOVERSION;  Surgeon: Thayer Headings, MD;  Location: [place];  Service: Cardiovascular;  Laterality: N/A;  . EP IMPLANTABLE DEVICE N/A 10/26/2015   Procedure: Pacemaker Implant;  Surgeon: Will Meredith Leeds, MD;  Location: [place] CV LAB;  Service: Cardiovascular;   Laterality: N/A;  . ESOPHAGOGASTRODUODENOSCOPY (EGD) WITH PROPOFOL N/A 04/07/2018   Procedure: ESOPHAGOGASTRODUODENOSCOPY (EGD) WITH PROPOFOL;  Surgeon: Jerene Bears, MD;  Location: [facility] ENDOSCOPY;  Service: Gastroenterology;  Laterality: N/A;  . ESOPHAGOGASTRODUODENOSCOPY (EGD) WITH PROPOFOL N/A 10/12/2019   Procedure: ESOPHAGOGASTRODUODENOSCOPY (EGD) WITH PROPOFOL;  Surgeon: Danie Binder, MD;  Location: [facility] ENDO SUITE;  Service: Endoscopy;  Laterality: N/A;  . EYE SURGERY Bilateral    cataract surgery  . HOT HEMOSTASIS N/A 04/07/2018   Procedure: HOT HEMOSTASIS (ARGON PLASMA COAGULATION/BICAP);  Surgeon: Jerene Bears, MD;  Location: [facility] ENDOSCOPY;  Service: Gastroenterology;  Laterality: N/A;  . HOT HEMOSTASIS  10/12/2019   Procedure: HOT HEMOSTASIS (ARGON PLASMA COAGULATION/BICAP);  Surgeon: Danie Binder, MD;  Location: [facility] ENDO SUITE;  Service: Endoscopy;;  . NASAL SINUS SURGERY    . PACEMAKER INSERTION    . TEE WITHOUT CARDIOVERSION N/A 10/25/2015   Procedure: TRANSESOPHAGEAL ECHOCARDIOGRAM (TEE);  Surgeon: Burnell Blanks, MD;  Location: [place];  Service: Open Heart Surgery;  Laterality: N/A;  . TRANSCATHETER AORTIC VALVE REPLACEMENT, TRANSFEMORAL Right 10/25/2015   Procedure: TRANSCATHETER AORTIC VALVE REPLACEMENT, TRANSFEMORAL;  Surgeon: Burnell Blanks, MD;  Location: [place];  Service: Open Heart Surgery;  Laterality: Right;   Family History:  Family History  Problem Relation Age of Onset  . Hypertension Mother   . Pneumonia Father   . Stomach cancer Brother   . Stroke Brother   . AAA (abdominal aortic aneurysm) Brother   . Cervical cancer Daughter   . Heart attack Neg Hx   . Colon cancer Neg Hx   . Esophageal cancer Neg Hx    Social History:  Social History   Tobacco Use  . Smoking status: Never Smoker  . Smokeless tobacco: Never Used  Vaping Use  . Vaping Use: Never used  Substance Use Topics  . Alcohol use: No    Alcohol/week: 0.0 standard drinks  . Drug  use: No    Home Medications:  Prior to Admission medications   Medication Sig Start Date End Date Taking? Authorizing Provider  acetaminophen (TYLENOL) 500 MG tablet Take 500 mg by mouth every 6 (six) hours as needed for mild pain or headache.    Yes [provider]  albuterol (PROAIR HFA) 108 (90 Base) MCG/ACT inhaler Inhale 2 puffs into the lungs every 4 (four) hours as needed for wheezing or shortness of breath. 08/05/19  Yes RakesConnye Burkitt, FNP  Biotin 10 MG CAPS Take 10 mg by mouth daily.    Yes [provider]  Budeson-Glycopyrrol-Formoterol (BREZTRI AEROSPHERE) 160-9-4.8 MCG/ACT AERO Inhale 2 Inhalers into the lungs in the morning and at bedtime. 11/27/19  Yes Ivy Lynn, NP  Calcium Carb-Cholecalciferol (CALTRATE 600+D3) 600-800 MG-UNIT TABS Take 1 tablet by mouth daily.   Yes [provider]  cefdinir (OMNICEF) 300 MG capsule Take 1 capsule (300 mg total) by mouth 2 (two) times daily for 5 days. 02/14/20 02/19/20 Yes Barton Dubois, MD  dextromethorphan-guaiFENesin [facility] DM) 30-600 MG 12hr tablet Take 1 tablet by mouth 2 (two) times daily for 7 days. 02/14/20 02/21/20 Yes Barton Dubois, MD  famotidine (PEPCID) 20 MG tablet Take 1 tablet (20 mg total) by mouth at bedtime. 12/21/19 12/20/20 Yes [name], MD  ferrous sulfate 325 (65 FE) MG tablet Take 1 tablet (325 mg total) by mouth 2 (two) times daily with a meal. 04/08/18  Yes Vann, Jessica U, DO  furosemide (LASIX) 20 MG tablet Take 3 tablets (60 mg total) by mouth daily. 12/15/19  Yes Lendon Colonel, NP  levothyroxine (SYNTHROID) 25 MCG tablet Take 1 tablet (25 mcg total) by mouth daily. 12/02/19  Yes [name], MD  memantine (NAMENDA) 10 MG tablet Take 1 tablet (10 mg total) by mouth 2 (two) times daily. 01/04/20  Yes Garvin Fila, MD  metoprolol tartrate (LOPRESSOR) 25 MG tablet Take 1 tablet (25 mg total) by mouth 2 (two) times daily. 05/20/19  Yes Camnitz, Will Hassell Done, MD   multivitamin-iron-minerals-folic acid (CENTRUM) chewable tablet Chew 1 tablet by mouth daily.   Yes [provider]  pantoprazole (PROTONIX) 40 MG tablet Take 1 tablet (40 mg total) by mouth 2 (two) times daily. 02/14/20  Yes Barton Dubois, MD  potassium chloride SA (KLOR-CON) 10 MEQ tablet Take 2 tablets (20 mEq total) by mouth 2 (two) times daily. 02/09/20  Yes [name], MD  OVER THE COUNTER MEDICATION Take 0.5 tablets by mouth daily as needed (for allergy). Patient not sure about the name. Patient not taking: Reported on 02/19/2020    [provider]  predniSONE (DELTASONE) 20 MG tablet Take 2 tablets (40 mg total) by mouth daily with breakfast for 4 days. Patient not taking: Reported on 02/19/2020 02/15/20 02/19/20  Barton Dubois, MD    Inpatient Medications:  Current Facility-Administered Medications:  .  0.9 %  sodium chloride infusion, , Intravenous, Continuous, Crosley, Debby, MD, Last Rate: 75 mL/hr at 02/19/20 0053, New Bag at 02/19/20 0053 .  acetaminophen (TYLENOL) tablet 650 mg, 650 mg, Oral, Q6H PRN **OR** acetaminophen (TYLENOL) suppository 650 mg, 650 mg, Rectal, Q6H PRN, Crosley, Debby, MD .  famotidine (PEPCID) IVPB 20 mg premix, 20 mg, Intravenous, Q12H, Quintella Baton, MD, Held at 02/19/20 1018 .  hydrALAZINE (APRESOLINE) injection 5 mg, 5 mg, Intravenous, Q4H PRN, Crosley, Debby, MD .  levothyroxine (SYNTHROID) tablet 25 mcg, 25 mcg, Oral, Daily, Crosley, Debby, MD .  Derrill Memo ON 02/20/2020] memantine (NAMENDA) tablet 10 mg, 10 mg, Oral, BID, Crosley, Debby, MD .  metoprolol tartrate (LOPRESSOR) tablet 25 mg, 25 mg, Oral, BID, Crosley, Debby, MD .  ondansetron (ZOFRAN) tablet 4 mg, 4 mg, Oral, Q6H PRN **OR** ondansetron (ZOFRAN) injection 4 mg, 4 mg, Intravenous, Q6H PRN, Crosley, Debby, MD .  pantoprazole (PROTONIX) injection 40 mg, 40 mg, Intravenous, Q12H, Crosley, Debby, MD  Current Outpatient Medications:  .  acetaminophen (TYLENOL) 500 MG tablet,  Take 500 mg by mouth every 6 (six) hours as needed for mild pain or headache. , Disp: , Rfl:  .  albuterol (PROAIR HFA) 108 (90 Base) MCG/ACT inhaler, Inhale 2 puffs into the lungs every 4 (four) hours as needed for wheezing or shortness of breath., Disp: 18 g, Rfl: 11 .  Biotin 10 MG CAPS, Take 10 mg by mouth daily. , Disp: , Rfl:  .  Budeson-Glycopyrrol-Formoterol (BREZTRI AEROSPHERE) 160-9-4.8 MCG/ACT AERO, Inhale 2 Inhalers into the lungs in the morning and at bedtime., Disp: 5.9 g, Rfl: 1 .  Calcium Carb-Cholecalciferol (CALTRATE 600+D3) 600-800 MG-UNIT TABS, Take 1 tablet by mouth daily., Disp: , Rfl:  .  cefdinir (OMNICEF) 300 MG capsule, Take 1 capsule (300 mg total) by mouth 2 (two) times daily for 5 days., Disp: 10 capsule, Rfl: 0 .  dextromethorphan-guaiFENesin (MUCINEX DM) 30-600 MG 12hr tablet, Take 1 tablet by mouth 2 (two) times daily for 7 days., Disp: 14 tablet, Rfl: 0 .  famotidine (PEPCID) 20 MG tablet, Take 1 tablet (20 mg total) by mouth at bedtime., Disp: 90 tablet, Rfl: 1 .  ferrous sulfate 325 (65 FE) MG tablet, Take 1 tablet (325 mg total) by mouth 2 (two) times daily with a meal., Disp: , Rfl: 3 .  furosemide (LASIX) 20 MG tablet, Take 3 tablets (60 mg total) by mouth daily., Disp: 270 tablet, Rfl: 3 .  levothyroxine (SYNTHROID) 25 MCG tablet, Take 1 tablet (25 mcg total) by mouth daily., Disp: 30 tablet, Rfl: 2 .  memantine (NAMENDA) 10 MG tablet, Take 1 tablet (10 mg total) by mouth 2 (two) times daily., Disp: 180 tablet, Rfl: 1 .  metoprolol tartrate (LOPRESSOR) 25 MG tablet, Take 1 tablet (25 mg total) by mouth 2 (two) times daily., Disp: 180 tablet, Rfl: 3 .  multivitamin-iron-minerals-folic acid (CENTRUM) chewable tablet, Chew 1 tablet by mouth daily., Disp: , Rfl:  .  pantoprazole (PROTONIX) 40 MG tablet, Take 1 tablet (40 mg total) by mouth 2 (two) times daily., Disp: 60 tablet, Rfl: 0 .  potassium chloride SA (KLOR-CON) 10 MEQ tablet, Take 2 tablets (20 mEq total) by  mouth 2 (two) times daily., Disp: 60 tablet, Rfl: 2 .  OVER THE COUNTER MEDICATION, Take 0.5 tablets by mouth daily as needed (for allergy). Patient not sure about the name. (Patient not taking: Reported on 02/19/2020), Disp: , Rfl:  .  predniSONE (DELTASONE) 20 MG tablet, Take 2 tablets (40 mg total) by mouth daily with breakfast for 4 days. (Patient not taking: Reported on 02/19/2020), Disp: 8 tablet, Rfl: 0 Allergies: Hctz [hydrochlorothiazide], Aspirin, and Codeine  Complete Review of Systems: GENERAL: negative for malaise, night sweats HEENT: No changes in hearing or vision, no nose bleeds or other nasal problems. NECK: Negative for lumps, goiter, pain and significant neck swelling RESPIRATORY: Negative for cough, wheezing CARDIOVASCULAR: Negative for chest pain, leg swelling, palpitations, orthopnea GI: SEE HPI MUSCULOSKELETAL: Negative for joint pain or swelling, back pain, and muscle pain. SKIN: Negative for lesions, rash PSYCH: Negative for sleep disturbance, mood disorder and recent psychosocial stressors. HEMATOLOGY Negative for prolonged bleeding, bruising easily, and swollen nodes. ENDOCRINE: Negative for cold or heat intolerance, polyuria, polydipsia and goiter. NEURO: negative for tremor, gait imbalance, syncope and seizures. The remainder of the review of systems is noncontributory.  Physical Exam: BP (!) 132/58 (BP Location: Left Arm)   Pulse (!) 59   Temp 97.6 F (36.4 C)   Resp 14   Ht _0  (1.549 m)   Wt 51.7 kg   SpO2 96%   BMI 21.54 kg/m  GENERAL: The patient is AO x3, in no acute distress. Elder and frail. HEENT: Head is normocephalic and atraumatic. EOMI are intact. Mouth is well hydrated and without lesions. NECK: Supple. No masses LUNGS: Clear to auscultation. No presence of rhonchi/wheezing/rales. Adequate chest expansion HEART: RRR, normal s1 and s2. ABDOMEN: Soft, nontender, no guarding, no peritoneal signs, and  nondistended. BS +. No  masses. EXTREMITIES: Without any cyanosis, clubbing, rash, lesions or edema. NEUROLOGIC: AOx3, no focal motor deficit. SKIN: no jaundice, no rashes   Laboratory Data CBC:     Component Value Date/Time   WBC 6.4 02/18/2020 2123   RBC 2.90 (L) 02/18/2020 2123   HGB 7.9 (L) 02/19/2020 0804   HGB 10.5 (L) 02/09/2020 1442   HCT 25.4 (L) 02/19/2020 0804   HCT 32.4 (L) 02/09/2020 1442   PLT 225 02/18/2020 2123   PLT 197 02/09/2020 1442   MCV 99.3 02/18/2020 2123   MCV 96 02/09/2020 1442   MCH 31.4 02/18/2020 2123   MCHC 31.6 02/18/2020 2123   RDW 14.3 02/18/2020 2123   RDW 13.0 02/09/2020 1442   LYMPHSABS 0.5 (L) 02/18/2020 2123   LYMPHSABS 0.9 02/09/2020 1442   MONOABS 0.3 02/18/2020 2123   EOSABS 0.0 02/18/2020 2123   EOSABS 0.1 02/09/2020 1442   BASOSABS 0.0 02/18/2020 2123   BASOSABS 0.1 02/09/2020 1442   COAG:  Lab Results  Component Value Date   INR 1.3 (H) 02/19/2020   INR 1.17 04/07/2018   INR 1.54 (H) 10/25/2015    BMP:  BMP Latest Ref Rng & Units 02/19/2020 02/18/2020 02/16/2020  Glucose 70 - 99 mg/dL 100(H) 157(H) 122(H)  BUN 8 - 23 mg/dL 34(H) 40(H) 23  Creatinine 0.44 - 1.00 mg/dL 1.58(H) 1.88(H) 1.62(H)  BUN/Creat Ratio 12 - 28 - - -  Sodium 135 - 145 mmol/L 135 134(L) 131(L)  Potassium 3.5 - 5.1 mmol/L 4.2 4.4 4.6  Chloride 98 - 111 mmol/L 102 97(L) 96(L)  CO2 22 - 32 mmol/L $RemoveB'28 27 26  'zulBFDds$ Calcium 8.9 - 10.3 mg/dL 8.6(L) 8.7(L) 9.2    HEPATIC:  Hepatic Function Latest Ref Rng & Units 02/16/2020 02/09/2020 01/12/2020  Total Protein 6.5 - 8.1 g/dL 6.5 6.3 5.7(L)  Albumin 3.5 - 5.0 g/dL 3.7 3.6 3.5  AST 15 - 41 U/L 46(H) 28 25  ALT 0 - 44 U/L 47(H) 16 13  Alk Phosphatase 38 - 126 U/L 79 93 79  Total Bilirubin 0.3 - 1.2 mg/dL 0.7 0.4 0.3    CARDIAC:  Lab Results  Component Value Date   CKTOTAL 263 (H) 08/25/2019   TROPONINI 0.06 ([place]) 04/06/2018     Imaging: I personally reviewed and interpreted the available imaging.  Assessment & Plan: ALLEYA DEMETER  is a 86 year old female with past medical history of supraventricular tachycardia status post ablation, stroke, history of duodenal AVMs, chronic renal disease stage III, history of COPD, GERD, dementia, who comes to the hospital after presenting melena and rectal bleeding. The patient had a history of gastrointestinal bleeding in the past due to 2 AVMs which were ablated and she was discontinued from her Eliquis. However she has had recurrent gastrointestinal bleeding, which seems to be coming from an upper GI source as she has had melena and elevated BUN with a drop in her hemoglobin. At this point, she needs to be continued on PPI IV twice daily and will proceed with an endoscopic evaluation. Likely source of bleeding is recurrent AVMs, less likely renal evaluation or ulcers. She has presented some rectal bleeding and lower abdominal pain, there is concern for possible ischemic colitis, for which we will proceed with a flexible sigmoidoscopy.  Finally, the patient had mild elevation of her transaminases, this could be multifactorial. We will need to repeat LFTs tomorrow.  I obtained a phone consent after discussing the benefits risks of the procedure with her  daughter Crystal Cooper.  # Upper gastrointestinal bleeding # Rectal bleeding # Elevated LFTs - Repeat CBC q8h, transfuse if Hb <7 - Pantoprazole 40 mg q12h IVP - 2 large bore IV lines - Active T/S - Keep NPO - Avoid NSAIDs - Will proceed with EGD and flex sig today - Repeat LFTs tomorrow  Harvel Quale, MD Gastroenterology and Hepatology [facility]   Note: Occasional unusual wording and randomly placed punctuation marks may result from the use of speech recognition technology to transcribe this document

## 2020-02-19 NOTE — ED Notes (Signed)
Pt resting Quietly  .  No distress.  Denies any pain.

## 2020-02-19 NOTE — Anesthesia Procedure Notes (Signed)
Date/Time: 02/19/2020 1:03 PM Performed by: Vista Deck, CRNA Pre-anesthesia Checklist: Patient identified, Emergency Drugs available, Suction available, Timeout performed and Patient being monitored Patient Re-evaluated:Patient Re-evaluated prior to induction Oxygen Delivery Method: Non-rebreather mask

## 2020-02-19 NOTE — Brief Op Note (Signed)
02/18/2020 - 02/19/2020  3:18 PM  PATIENT:  Crystal Cooper  86 y.o. female  PRE-OPERATIVE DIAGNOSIS:  melena  POST-OPERATIVE DIAGNOSIS:  EGD: hiatal hernia, superficial gastric ulcer  FLEXSIG: no bleeding/inflammation   PROCEDURE:  Procedure(s) with comments: FLEXIBLE SIGMOIDOSCOPY BIOPSY - gastric  ENTEROSCOPY  SURGEON:  Surgeon(s) and Role:    * Harvel Quale, MD - Primary  Patient underwent push enteroscopy and flexible sigmoidoscopy today.  The upper scope was advanced up to the proximal jejunum.  There was visualization of very small superficial ulcer in the body of the stomach with small clot attached to it, with spontaneous oozing after irrigation was performed.  This was ablated with bipolar probe without further bleeding.  Biopsies from the stomach were obtained to rule out H. pylori.  No alterations were observed in the esophagus, duodenum and proximal jejunum.  Recommendation: - Return patient to hospital ward for ongoing care. - Clear liquid diet today. - Use Protonix (pantoprazole) 40 mg PO BID for 2 months, decrease to daily dosing afterwards. - Trend H/H every 12 hours. - If patient has worsening anemia or overt bleeding, please obtain abdominal bleeding scan STAT. - Await pathology results.  Maylon Peppers, MD Gastroenterology and Hepatology [facility]

## 2020-02-19 NOTE — Op Note (Signed)
[facility] Patient Name: Crystal Cooper Procedure Date: 02/19/2020 1:39 PM MRN: [id number] Date of Birth: 02-08-1927 Attending MD: Maylon Peppers ,  CSN: [id number] Age: 86 Admit Type: Inpatient Procedure:                Flexible Sigmoidoscopy Indications:              Hematochezia Providers:                Maylon Peppers, Caprice Kluver, Rosina Lowenstein, RN,                            Raphael Gibney, Technician Referring MD:              Medicines:                Monitored Anesthesia Care Complications:            No immediate complications. Estimated Blood Loss:     Estimated blood loss: none. Procedure:                Pre-Anesthesia Assessment:                           - Prior to the procedure, a History and Physical                            was performed, and patient medications, allergies                            and sensitivities were reviewed. The patient's                            tolerance of previous anesthesia was reviewed.                           - The risks and benefits of the procedure and the                            sedation options and risks were discussed with the                            patient. All questions were answered and informed                            consent was obtained.                           - ASA Grade Assessment: III - A patient with severe                            systemic disease.                           After obtaining informed consent, the scope was                            passed under direct vision. The GIF-[id number] ([id number])  scope was introduced through the anus and advanced                            to the 60 cm from the anal verge. The flexible                            sigmoidoscopy was accomplished without difficulty.                            The patient tolerated the procedure well. Scope In: 1:41:46 PM Scope Out: 1:49:22 PM Total Procedure Duration: 0 hours 7 minutes 36 seconds   Findings:      The perianal and digital rectal examinations were normal. Pertinent       negatives include normal sphincter tone.      Extensive amounts of stool was found from anus to transverse colon,       making visualization difficult. The stool was brown and black but there       was no evidence of fresh blood in the lumen or any active bleeding.       There was no presence of inflammation or ulcerations. Normal       retroflexion. Impression:               - Stool from anus to transverse colon.                           - No specimens collected. Moderate Sedation:      Per Anesthesia Care Recommendation:           - Return patient to hospital ward for ongoing care.                           - Clear liquid diet. Procedure Code(s):        --- Professional ---                           [phone], GC, Sigmoidoscopy, flexible; diagnostic,                            including collection of specimen(s) by brushing or                            washing, when performed (separate procedure) Diagnosis Code(s):        --- Professional ---                           K92.1, Melena (includes Hematochezia) CPT copyright 2019 [employer]. All rights reserved. The codes documented in this report are preliminary and upon coder review may  be revised to meet current compliance requirements. Maylon Peppers, MD Maylon Peppers,  02/19/2020 2:00:14 PM This report has been signed electronically. Number of Addenda: 0

## 2020-02-19 NOTE — Progress Notes (Addendum)
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  The patient and daughter report that she has had recurrent episodes of melena since she was discharged from the hospital last Sunday 02/14/20. She noticed that for the last two days she had rectal bleeding since yesterday in multiple episodes.   In addition, the patient had 3 bloody bowel movements on 02/18/2020.  The patient contacted her PCP who directed the patient to go to the emergency department.  Notably, the patient has been treated with apixaban for atrial fibrillation.  She suffered a right MCA stroke in March 2021 for which apixaban was recommended to continue.  However, she had an admission to the hospital from 10/10/2019 to 10/14/2019 for a GI bleed with acute blood loss anemia.  She underwent EGD which showed erosive gastritis with a small angiectasia in the greater curvature of the stomach status post APC at that time.  She followed up with her cardiology physician on 01/22/2020.  At that time, the decision was made to stop the patient's apixaban after discussion of the risk, benefits, and alternatives.  Nevertheless, the patient denies any fevers, chills, chest pain, shortness breath, dizziness, abdominal pain.  In the emergency department, the patient was noted to have hemoglobin of 8.1.  She was afebrile hemodynamically stable with oxygen saturation 98% room air.  The patient was admitted for further evaluation and treatment.  GI was consulted to assist with management.  Assessment/Plan: Hematochezia -GI consult appreciated -Clear liquid diet for now -Continue IV Protonix -02/19/20 EGD--oozing gastric ulcer with  adherent clot tx with bipolar cautery -02/19/20--flex sig--stool form anus to transverse colon -continue to trend Hgb  Essential hypertension -hold furosemide  -continue metoprolol tartrate  Paroxysmal atrial fibrillation -Currently in sinus rhythm -She has been off apixaban since 01/22/2020  COPD -Stable on room air  Mixed vascular and Alzheimer's dementia -Follow-up with neurology -continue namenda  Junctional bradycardia status post TAVR -She has PPM  CKD stage IIIb -Baseline creatinine 1.3-1.6 -A.m. BMP  Chronic diastolic CHF -Clinically euvolemic  Hypothyroidism -continue synthroid      Status is: Observation  The patient will require care spanning > 2 midnights and should be moved to inpatient because: Ongoing diagnostic testing needed not appropriate for outpatient work up  Dispo: The patient is from: Home              Anticipated d/c is to: Home              Anticipated d/c date is: 2 days              Patient currently is not medically stable to d/c.        Family Communication:   Daughter at bedside 02/19/20  Consultants:  GI  Code Status:  FULL   DVT Prophylaxis:  SCDs   Procedures: As Listed in Progress Note Above  Antibiotics: None  Total time spent 35 minutes.  Greater than 50% spent face to face counseling and coordinating care.    Subjective: Patient denies fevers, chills, headache, chest pain, dyspnea, nausea, vomiting, diarrhea, abdominal pain,    Objective: Vitals:   02/19/20 0600 02/19/20 0800 02/19/20 0930 02/19/20 1155  BP: 125/66 (!) 128/57 [name])  128/57 (!) 132/58  Pulse: (!) 58 61 61 (!) 59  Resp: 17 12 11 14   Temp:    97.6 F (36.4 C)  TempSrc:      SpO2: 96% 98% 98% 96%  Weight:      Height:        Intake/Output Summary (Last 24 hours) at 02/19/2020 1216 Last data filed at 02/19/2020 2956 Gross per 24 hour  Intake --  Output 200 ml  Net -200 ml   Weight change:  Exam:   General:  Pt is alert, follows  commands appropriately, not in acute distress  HEENT: No icterus, No thrush, No neck mass, [place]/AT  Cardiovascular: RRR, S1/S2, no rubs, no gallops  Respiratory: bibasilar rales. No wheeze  Abdomen: Soft/+BS, non tender, non distended, no guarding  Extremities: No edema, No lymphangitis, No petechiae, No rashes, no synovitis   Data Reviewed: I have personally reviewed following labs and imaging studies Basic Metabolic Panel: Recent Labs  Lab 02/13/20 0617 02/14/20 0728 02/16/20 1338 02/18/20 2123 02/19/20 0804  NA 135 133* 131* 134* 135  K 4.8 4.3 4.6 4.4 4.2  CL 104 101 96* 97* 102  CO2 26 24 26 27 28   GLUCOSE 94 142* 122* 157* 100*  BUN 25* 24* 23 40* 34*  CREATININE 1.66* 1.44* 1.62* 1.88* 1.58*  CALCIUM 8.7* 9.1 9.2 8.7* 8.6*   Liver Function Tests: Recent Labs  Lab 02/16/20 1338  AST 46*  ALT 47*  ALKPHOS 79  BILITOT 0.7  PROT 6.5  ALBUMIN 3.7   No results for input(s): LIPASE, AMYLASE in the last 168 hours. No results for input(s): AMMONIA in the last 168 hours. Coagulation Profile: Recent Labs  Lab 02/19/20 0804  INR 1.3*   CBC: Recent Labs  Lab 02/13/20 0617 02/13/20 0617 02/14/20 0728 02/16/20 1338 02/18/20 2123 02/19/20 0120 02/19/20 0804  WBC 3.5*  --  4.5 8.3 6.4  --   --   NEUTROABS  --   --   --  7.5 5.6  --   --   HGB 9.2*   < > 9.3* 9.7* 9.1* 8.1* 7.9*  HCT 29.4*   < > 29.9* 30.9* 28.8* 26.1* 25.4*  MCV 100.0  --  99.0 98.7 99.3  --   --   PLT 159  --  175 210 225  --   --    < > = values in this interval not displayed.   Cardiac Enzymes: No results for input(s): CKTOTAL, CKMB, CKMBINDEX, TROPONINI in the last 168 hours. BNP: Invalid input(s): POCBNP CBG: No results for input(s): GLUCAP in the last 168 hours. HbA1C: No results for input(s): HGBA1C in the last 72 hours. Urine analysis:    Component Value Date/Time   COLORURINE YELLOW 02/12/2020 1353   APPEARANCEUR CLEAR 02/12/2020 1353   APPEARANCEUR Hazy (A) 02/09/2020  1400   LABSPEC 1.012 02/12/2020 1353   PHURINE 7.0 02/12/2020 1353   GLUCOSEU NEGATIVE 02/12/2020 1353   HGBUR NEGATIVE 02/12/2020 1353   BILIRUBINUR NEGATIVE 02/12/2020 1353   BILIRUBINUR Negative 02/09/2020 1400   KETONESUR NEGATIVE 02/12/2020 1353   PROTEINUR NEGATIVE 02/12/2020 1353   NITRITE NEGATIVE 02/12/2020 1353   LEUKOCYTESUR NEGATIVE 02/12/2020 1353   Sepsis Labs: @LABRCNTIP (procalcitonin:4,lacticidven:4) ) Recent Results (from the past 240 hour(s))  Urine Culture     Status: Abnormal   Collection Time: 02/09/20  2:00 PM   Specimen: Urine   UR  Result Value Ref Range Status   Urine Culture, Routine Final report (A)  Final   Organism ID, Bacteria Escherichia coli (A)  Final    Comment: Cefazolin <=4 ug/mL Cefazolin with an MIC <=16 predicts susceptibility to the oral agents cefaclor, cefdinir, cefpodoxime, cefprozil, cefuroxime, cephalexin, and loracarbef when used for therapy of uncomplicated urinary tract infections due to E. coli, Klebsiella pneumoniae, and Proteus mirabilis. Greater than 100,000 colony forming units per mL    Antimicrobial Susceptibility Comment  Final    Comment:       ** S = Susceptible; I = Intermediate; R = Resistant **                    P = Positive; N = Negative             MICS are expressed in micrograms per mL    Antibiotic                 RSLT#1    RSLT#2    RSLT#3    RSLT#4 Amoxicillin/Clavulanic Acid    S Ampicillin                     S Cefepime                       S Ceftriaxone                    S Cefuroxime                     S Ciprofloxacin                  S Ertapenem                      S Gentamicin                     S Imipenem                       S Levofloxacin                   S Meropenem                      S Nitrofurantoin                 S Piperacillin/Tazobactam        S Tetracycline                   S Tobramycin                     S Trimethoprim/Sulfa             S   Microscopic Examination      Status: Abnormal   Collection Time: 02/09/20  2:00 PM   Urine  Result Value Ref Range Status   WBC, UA 6-10 (A) 0 - 5 /hpf Final   RBC None seen 0 - 2 /hpf Final   Epithelial Cells (non renal) 0-10 0 - 10 /hpf Final   Bacteria, UA Moderate (A) None seen/Few Final  SARS Coronavirus 2 by RT PCR (hospital order, performed in [place] hospital lab) Nasopharyngeal Nasopharyngeal Swab     Status: None   Collection Time: 02/12/20  7:57 AM   Specimen: Nasopharyngeal Swab  Result Value Ref Range Status   SARS Coronavirus 2 NEGATIVE NEGATIVE Final  Comment: (NOTE) SARS-CoV-2 target nucleic acids are NOT DETECTED.  The SARS-CoV-2 RNA is generally detectable in upper and lower respiratory specimens during the acute phase of infection. The lowest concentration of SARS-CoV-2 viral copies this assay can detect is 250 copies / mL. A negative result does not preclude SARS-CoV-2 infection and should not be used as the sole basis for treatment or other patient management decisions.  A negative result may occur with improper specimen collection / handling, submission of specimen other than nasopharyngeal swab, presence of viral mutation(s) within the areas targeted by this assay, and inadequate number of viral copies (<250 copies / mL). A negative result must be combined with clinical observations, patient history, and epidemiological information.  Fact Sheet for Patients:   StrictlyIdeas.no  Fact Sheet for Healthcare Providers: BankingDealers.co.za  This test is not yet approved or  cleared by the [place] FDA and has been authorized for detection and/or diagnosis of SARS-CoV-2 by FDA under an Emergency Use Authorization (EUA).  This EUA will remain in effect (meaning this test can be used) for the duration of the COVID-19 declaration under Section 564(b)(1) of the Act, 21 U.S.C. section 360bbb-3(b)(1), unless the authorization is terminated  or revoked sooner.  Performed at [facility], [address], [place], [place] [address]   Culture, blood (routine x 2) Call MD if unable to obtain prior to antibiotics being given     Status: None   Collection Time: 02/12/20 11:03 AM   Specimen: Right Antecubital; Blood  Result Value Ref Range Status   Specimen Description RIGHT ANTECUBITAL  Final   Special Requests   Final    BOTTLES DRAWN AEROBIC AND ANAEROBIC Blood Culture adequate volume   Culture   Final    NO GROWTH 5 DAYS Performed at [facility], [address] [place], [place] [address]    Report Status 02/17/2020 FINAL  Final  Culture, blood (routine x 2) Call MD if unable to obtain prior to antibiotics being given     Status: None   Collection Time: 02/12/20 11:04 AM   Specimen: BLOOD RIGHT WRIST  Result Value Ref Range Status   Specimen Description BLOOD RIGHT WRIST  Final   Special Requests   Final    BOTTLES DRAWN AEROBIC AND ANAEROBIC Blood Culture adequate volume   Culture   Final    NO GROWTH 5 DAYS Performed at [facility], [address] [place], [place] [address]    Report Status 02/17/2020 FINAL  Final  Urine culture     Status: None   Collection Time: 02/12/20  1:53 PM   Specimen: Urine, Clean Catch  Result Value Ref Range Status   Specimen Description   Final    URINE, CLEAN CATCH Performed at [facility], [address], [place], [place] [address]    Special Requests   Final    NONE Performed at [facility], [address], [place], [place] [address]    Culture   Final    NO GROWTH Performed at [facility], [place] [address] [place],  [address]    Report Status 02/13/2020 FINAL  Final  SARS Coronavirus 2 by RT PCR (hospital order, performed in [facility] hospital lab) Nasopharyngeal Nasopharyngeal Swab     Status: None   Collection Time: 02/18/20 11:35 PM   Specimen: Nasopharyngeal Swab  Result Value Ref Range Status   SARS Coronavirus 2 NEGATIVE NEGATIVE Final    Comment:  (NOTE) SARS-CoV-2 target nucleic acids are NOT DETECTED.  The SARS-CoV-2 RNA is generally detectable in upper and lower respiratory specimens during the acute phase of infection. The lowest concentration of SARS-CoV-2 viral copies this assay can detect is 250 copies / mL. A negative result does not preclude SARS-CoV-2 infection and should not be used as the sole basis for treatment or other patient management decisions.  A negative result may occur with improper specimen collection / handling, submission of specimen other than nasopharyngeal swab, presence of viral mutation(s) within the areas targeted by this assay, and inadequate number of viral copies (<250 copies / mL). A negative result must be combined with clinical observations, patient history, and epidemiological information.  Fact Sheet for Patients:   StrictlyIdeas.no  Fact Sheet for Healthcare Providers: BankingDealers.co.za  This test is not yet approved or  cleared by the [place] FDA and has been authorized for detection and/or diagnosis of SARS-CoV-2 by FDA under an Emergency Use Authorization (EUA).  This EUA will remain in effect (meaning this test can be used) for the duration of the COVID-19 declaration under Section 564(b)(1) of the Act, 21 U.S.C. section 360bbb-3(b)(1), unless the authorization is terminated or revoked sooner.  Performed at [facility], [address] [place], [place] [address]      Scheduled Meds: . levothyroxine  25 mcg Oral Daily  . [START ON 02/20/2020] memantine  10 mg Oral BID  . metoprolol tartrate  25 mg Oral BID  . pantoprazole (PROTONIX) IV  40 mg Intravenous Q12H   Continuous Infusions: . sodium chloride 75 mL/hr at 02/19/20 0053  . famotidine (PEPCID) IV Stopped (02/19/20 1018)    Procedures/Studies: DG Chest Port 1 View  Result Date: 02/12/2020 CLINICAL DATA:  Shortness of breath. EXAM: PORTABLE CHEST 1 VIEW  COMPARISON:  12/13/2019.  10/19/2019. FINDINGS: Cardiac pacer stable position. Prior cardiac valve replacement. Stable cardiomegaly. No pulmonary venous congestion. Bilateral upper lobe pulmonary infiltrates noted on today's exam. No pleural effusion or pneumothorax. IMPRESSION: 1. Cardiac pacer stable position. Prior cardiac valve replacement. Stable cardiomegaly. No pulmonary venous congestion. 2. Bilateral upper lobe pulmonary infiltrates noted on today's exam. Electronically Signed   By: Gracey   On: 02/12/2020 07:21   CUP PACEART REMOTE DEVICE CHECK  Result Date: 02/10/2020 Scheduled remote reviewed. Normal device function.  Known persistent AF, not OAC candidate. Next remote 91 days. Osvaldo Human, DO  [facility]  If 7PM-7AM, please contact night-coverage [email or web address] Password [email or web address] 02/19/2020, 12:16 PM   LOS: 0 days

## 2020-02-19 NOTE — Anesthesia Preprocedure Evaluation (Signed)
Anesthesia Evaluation  Patient identified by MRN, date of birth, ID band Patient awake and Patient confused    Reviewed: Allergy & Precautions, H&P , NPO status , Patient's Chart, lab work & pertinent test results, reviewed documented beta blocker date and time   Airway Mallampati: II  TM Distance: >3 FB Neck ROM: full    Dental no notable dental hx.    Pulmonary shortness of breath, asthma , pneumonia, resolved, COPD,  COPD inhaler,    Pulmonary exam normal breath sounds clear to auscultation       Cardiovascular Exercise Tolerance: Good hypertension, +CHF  + pacemaker  Rhythm:regular Rate:Normal     Neuro/Psych PSYCHIATRIC DISORDERS Dementia CVA, No Residual Symptoms negative psych ROS   GI/Hepatic Neg liver ROS, GERD  Medicated,  Endo/Other  negative endocrine ROS  Renal/GU CRFnegative Renal ROS  negative genitourinary   Musculoskeletal   Abdominal   Peds  Hematology  (+) Blood dyscrasia, anemia ,   Anesthesia Other Findings   Reproductive/Obstetrics negative OB ROS                             Anesthesia Physical  Anesthesia Plan  ASA: IV and emergent  Anesthesia Plan: General   Post-op Pain Management:    Induction:   PONV Risk Score and Plan: 3 and Propofol infusion and Treatment may vary due to age or medical condition  Airway Management Planned:   Additional Equipment:   Intra-op Plan:   Post-operative Plan:   Informed Consent: I have reviewed the patients History and Physical, chart, labs and discussed the procedure including the risks, benefits and alternatives for the proposed anesthesia with the patient or authorized representative who has indicated his/her understanding and acceptance.     Dental Advisory Given  Plan Discussed with: CRNA  Anesthesia Plan Comments:         Anesthesia Quick Evaluation

## 2020-02-19 NOTE — Anesthesia Postprocedure Evaluation (Signed)
Anesthesia Post Note  Patient: Candise W Eagles  Procedure(s) Performed: FLEXIBLE SIGMOIDOSCOPY BIOPSY ENTEROSCOPY  Patient location during evaluation: PACU Anesthesia Type: General Level of consciousness: awake and alert and patient cooperative Pain management: satisfactory to patient Vital Signs Assessment: post-procedure vital signs reviewed and stable Respiratory status: spontaneous breathing Cardiovascular status: stable Postop Assessment: no apparent nausea or vomiting Anesthetic complications: no   No complications documented.   Last Vitals:  Vitals:   02/19/20 1251 02/19/20 1358  BP: (!) 141/98 (!) 101/58  Pulse:  67  Resp:  13  Temp: 36.7 C 36.5 C  SpO2: 96% 99%    Last Pain:  Vitals:   02/19/20 1305  TempSrc:   PainSc: 0-No pain                 Zaccary Creech

## 2020-02-19 NOTE — Op Note (Addendum)
[facility] Patient Name: Crystal Cooper Procedure Date: 02/19/2020 12:41 PM MRN: [id number] Date of Birth: Jun 01, 1927 Attending MD: Maylon Peppers ,  CSN: [id number] Age: 86 Admit Type: Inpatient Procedure:                Small bowel enteroscopy Indications:              Melena Providers:                Maylon Peppers, Rosina Lowenstein, RN, Caprice Kluver,                            Raphael Gibney, Technician Referring MD:              Medicines:                Monitored Anesthesia Care Complications:            No immediate complications. Estimated Blood Loss:     Estimated blood loss: none. Procedure:                Pre-Anesthesia Assessment:                           - Prior to the procedure, a History and Physical                            was performed, and patient medications, allergies                            and sensitivities were reviewed. The patient's                            tolerance of previous anesthesia was reviewed.                           - The risks and benefits of the procedure and the                            sedation options and risks were discussed with the                            patient. All questions were answered and informed                            consent was obtained.                           - ASA Grade Assessment: III - A patient with severe                            systemic disease.                           After obtaining informed consent, the endoscope was                            passed under direct vision. Throughout the  procedure, the patient's blood pressure, pulse, and                            oxygen saturations were monitored continuously. The                            [id number]-[id number] ([id number]) scope was introduced through the                            mouth, and advanced to the proximal jejunum. After                            obtaining informed consent, the endoscope was                             passed under direct vision. Throughout the                            procedure, the patient's blood pressure, pulse, and                            oxygen saturations were monitored continuously.The                            small bowel enteroscopy was accomplished without                            difficulty. The patient tolerated the procedure                            well. Scope In: 1:11:17 PM Scope Out: 1:29:16 PM Total Procedure Duration: 0 hours 17 minutes 59 seconds  Findings:      The examined esophagus was normal.      One superficial gastric ulcer with a small clot (Forrest Class IIb) was       found on the posterior wall of the stomach. Upon irrigation there was       mild oozing. The lesion was 4 mm in largest dimension. Coagulation for       hemostasis using bipolar probe was successful. Biopsies were taken with       a cold forceps for Helicobacter pylori testing in body and antrum.      Diffuse atrophic mucosa was found in the entire examined stomach.      There was no evidence of significant pathology in the entire examined       duodenum.      There was no evidence of significant pathology in the proximal jejunum. Impression:               - Normal esophagus.                           - Oozing gastric ulcer with an adherent clot                            (Forrest Class IIb). Treated with bipolar cautery.  Biopsied.                           - Gastric mucosal atrophy.                           - Normal examined duodenum.                           - The examined portion of the jejunum was normal. Moderate Sedation:      Per Anesthesia Care Recommendation:           - Return patient to hospital ward for ongoing care.                           - Clear liquid diet today.                           - Use Protonix (pantoprazole) 40 mg PO BID for 2                            months, decrease to daily dosing afterwards.                            - Trend H/H every 12 hours.                           - If patient has worsening anemia or overt                            bleeding, please obtain abdominal bleeding scan                            STAT.                           - Await pathology results. Procedure Code(s):        --- Professional ---                           [phone], GC, Small intestinal endoscopy, enteroscopy                            beyond second portion of duodenum, not including                            ileum; with biopsy, single or multiple Diagnosis Code(s):        --- Professional ---                           K25.4, Chronic or unspecified gastric ulcer with                            hemorrhage                           K31.89, Other diseases of stomach and  duodenum                           K92.1, Melena (includes Hematochezia) CPT copyright 2019 American Medical Association. All rights reserved. The codes documented in this report are preliminary and upon coder review may  be revised to meet current compliance requirements. Maylon Peppers, MD Maylon Peppers,  02/19/2020 1:56:28 PM This report has been signed electronically. Number of Addenda: 0

## 2020-02-19 NOTE — Transfer of Care (Signed)
Immediate Anesthesia Transfer of Care Note  Patient: Crystal Cooper  Procedure(s) Performed: FLEXIBLE SIGMOIDOSCOPY BIOPSY ENTEROSCOPY  Patient Location: PACU  Anesthesia Type:General  Level of Consciousness: awake and patient cooperative  Airway & Oxygen Therapy: Patient Spontanous Breathing and Patient connected to nasal cannula oxygen  Post-op Assessment: Report given to RN and Post -op Vital signs reviewed and stable  Post vital signs: Reviewed and stable97  Last Vitals:  Vitals Value Taken Time  BP    Temp 97. 7   Pulse 67 02/19/20 1358  Resp 11 02/19/20 1358  SpO2 99 % 02/19/20 1358  Vitals shown include unvalidated device data.  Last Pain:  Vitals:   02/19/20 1305  TempSrc:   PainSc: 0-No pain      Patients Stated Pain Goal: 8 (70/92/95 7473)  Complications: No complications documented.

## 2020-02-20 DIAGNOSIS — K922 Gastrointestinal hemorrhage, unspecified: Secondary | ICD-10-CM

## 2020-02-20 LAB — COMPREHENSIVE METABOLIC PANEL
ALT: 38 U/L (ref 0–44)
AST: 36 U/L (ref 15–41)
Albumin: 2.7 g/dL — ABNORMAL LOW (ref 3.5–5.0)
Alkaline Phosphatase: 52 U/L (ref 38–126)
Anion gap: 6 (ref 5–15)
BUN: 21 mg/dL (ref 8–23)
CO2: 26 mmol/L (ref 22–32)
Calcium: 8.1 mg/dL — ABNORMAL LOW (ref 8.9–10.3)
Chloride: 102 mmol/L (ref 98–111)
Creatinine, Ser: 1.26 mg/dL — ABNORMAL HIGH (ref 0.44–1.00)
GFR calc Af Amer: 43 mL/min — ABNORMAL LOW (ref >60)
GFR calc non Af Amer: 37 mL/min — ABNORMAL LOW (ref >60)
Glucose, Bld: 80 mg/dL (ref 70–99)
Potassium: 4.2 mmol/L (ref 3.5–5.1)
Sodium: 134 mmol/L — ABNORMAL LOW (ref 135–145)
Total Bilirubin: 0.8 mg/dL (ref 0.3–1.2)
Total Protein: 4.7 g/dL — ABNORMAL LOW (ref 6.5–8.1)

## 2020-02-20 LAB — HEMOGLOBIN AND HEMATOCRIT, BLOOD
HCT: 25.4 % — ABNORMAL LOW (ref 36.0–46.0)
HCT: 25.8 % — ABNORMAL LOW (ref 36.0–46.0)
HCT: 27.9 % — ABNORMAL LOW (ref 36.0–46.0)
Hemoglobin: 7.8 g/dL — ABNORMAL LOW (ref 12.0–15.0)
Hemoglobin: 8 g/dL — ABNORMAL LOW (ref 12.0–15.0)
Hemoglobin: 8.7 g/dL — ABNORMAL LOW (ref 12.0–15.0)

## 2020-02-20 MED ORDER — BUDESONIDE 0.5 MG/2ML IN SUSP
0.5000 mg | Freq: Two times a day (BID) | RESPIRATORY_TRACT | Status: DC
  Administered 2020-02-20 – 2020-02-21 (×3): 0.5 mg via RESPIRATORY_TRACT
  Filled 2020-02-20 (×3): qty 2

## 2020-02-20 MED ORDER — IPRATROPIUM-ALBUTEROL 0.5-2.5 (3) MG/3ML IN SOLN
3.0000 mL | Freq: Four times a day (QID) | RESPIRATORY_TRACT | Status: DC
  Administered 2020-02-20 (×2): 3 mL via RESPIRATORY_TRACT
  Filled 2020-02-20 (×2): qty 3

## 2020-02-20 NOTE — Progress Notes (Signed)
Patient feels well this afternoon.  Tolerating diet.  Wants to go home. No BM today. Accompanied by daughter, Quita Skye.  Vital signs in last 24 hours: Temp:  [97.6 F (36.4 C)-98 F (36.7 C)] 97.6 F (36.4 C) (08/28 0643) Pulse Rate:  [59-60] 59 (08/28 0643) Resp:  [16-19] 19 (08/28 0643) BP: (111-146)/(54-74) 146/66 (08/28 0643) SpO2:  [95 %-100 %] 95 % (08/28 0643) Last BM Date: 02/20/20 General:   Alert,  pleasant and cooperative in NAD Abdomen: Nondistended.  Positive bowel sounds soft nontender without appreciable mass organomegaly  Intake/Output from previous day: 08/27 0701 - 08/28 0700 In: 1964.8 [P.O.:360; I.V.:1604.8] Out: -  Intake/Output this shift: No intake/output data recorded.  Lab Results: Recent Labs    02/18/20 2123 02/19/20 0120 02/19/20 1623 02/20/20 0040 02/20/20 0712  WBC 6.4  --   --   --   --   HGB 9.1*   < > 8.7* 7.8* 8.0*  HCT 28.8*   < > 27.9* 25.4* 25.8*  PLT 225  --   --   --   --    < > = values in this interval not displayed.   BMET Recent Labs    02/18/20 2123 02/19/20 0804 02/20/20 0712  NA 134* 135 134*  K 4.4 4.2 4.2  CL 97* 102 102  CO2 27 28 26   GLUCOSE 157* 100* 80  BUN 40* 34* 21  CREATININE 1.88* 1.58* 1.26*  CALCIUM 8.7* 8.6* 8.1*   LFT Recent Labs    02/20/20 0712  PROT 4.7*  ALBUMIN 2.7*  AST 36  ALT 38  ALKPHOS 52  BILITOT 0.8   PT/INR Recent Labs    02/19/20 0804  LABPROT 15.3*  INR 1.3*      Impression: 86 year old lady with melena in the setting of multiple other comorbidities.  Status post EGD yesterday demonstrated a small ulcer which was ablated with BiCAP.  Gastric biopsies for H. pylori pending.  Limited FS revealed no abnormalities.  H&H stable.  Clinically, no further bleeding.  Recommendations: Home in the next 24 hours if she remains stable.  Continue twice daily PPI for the next 12 weeks.  Follow-up on pathology.  Follow-up with Dr. Hilarie Fredrickson as planned. If evidence of recurrent GI  bleeding,  would consider video capsule study the small bowel.  Discussed with Dr. Carles Collet.

## 2020-02-20 NOTE — Progress Notes (Signed)
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 The patientand daughterreport that she has had recurrent episodes of melena since she was discharged from the hospital last Sunday 02/14/20. She noticed that for the last two days she had rectal bleedingsinceyesterdayin multiple episodes.   In addition, the patient had 3 bloody bowel movements on 02/18/2020.  The patient contacted her PCP who directed the patient to go to the emergency department.  Notably, the patient has been treated with apixaban for atrial fibrillation.  She suffered a right MCA stroke in March 2021 for which apixaban was recommended to continue.  However, she had an admission to the hospital from 10/10/2019 to 10/14/2019 for a GI bleed with acute blood loss anemia.  She underwent EGD which showed erosive gastritis with a small angiectasia in the greater curvature of the stomach status post APC at that time.  She followed up with her cardiology physician on 01/22/2020.  At that time, the decision was made to stop the patient's apixaban after discussion of the risk, benefits, and alternatives.  Nevertheless, the patient denies any fevers, chills, chest pain, shortness breath, dizziness, abdominal pain.  In the emergency department, the patient was noted to have hemoglobin of 8.1.  She was afebrile hemodynamically stable with oxygen saturation 98% room air.  The patient was admitted for further evaluation and treatment.  GI was consulted to assist with management.  Assessment/Plan: Hematochezia -GI consult appreciated -Clear liquid diet>>advance to soft diet -Continue IV Protonix -02/19/20 EGD--oozing gastric  ulcer with adherent clot tx with bipolar cautery -02/19/20--flex sig--stool form anus to transverse colon -continue to trend Hgb-->Hgb remains stable  Essential hypertension -hold furosemide  -continue metoprolol tartrate  Paroxysmal atrial fibrillation -Currently in sinus rhythm -She has been off apixaban since 01/22/2020  COPD -Stable on room air -incentive spirometry -restart duonebs  Mixed vascular and Alzheimer's dementia -Follow-up with neurology -continue namenda  Junctional bradycardia status post TAVR -She has PPM  CKD stage IIIb -Baseline creatinine 1.3-1.6 -A.m. BMP  Chronic diastolic CHF -Clinically euvolemic  Hypothyroidism -continue synthroid      Status is: Inpatient  The patient will require care spanning > 2 midnights and should be moved to inpatient because: Ongoing diagnostic testing needed not appropriate for outpatient work up  Dispo: The patient is from: Home  Anticipated d/c is to: Home  Anticipated d/c date is: 8/29  Patient currently is not medically stable to d/c.        Family Communication:   Daughter at bedside 02/20/20  Consultants:  GI  Code Status:  FULL   DVT Prophylaxis:  SCDs   Procedures: As Listed in Progress Note Above  Antibiotics: None      Subjective: Patient denies fevers, chills, headache, chest pain, dyspnea, nausea, vomiting, diarrhea, abdominal pain, dysuria, hematuria, hematochezia, and melena.   Objective: Vitals:   02/19/20 1430 02/19/20 1600 02/19/20 2203 02/20/20 0643  BP: (!) 127/54 138/74 111/64 (!) 146/66  Pulse: 60 (!) 59 60 (!) 59  Resp:  16 18 19   Temp:   98 F (36.7 C) 97.6 F (36.4 C)  TempSrc:   Oral Oral  SpO2: 100% 97% 95% 95%  Weight:  Height:        Intake/Output Summary (Last 24 hours) at 02/20/2020 1416 Last data filed at 02/20/2020 0400 Gross per 24 hour  Intake 1964.75 ml  Output --  Net  1964.75 ml   Weight change:  Exam:   General:  Pt is alert, follows commands appropriately, not in acute distress  HEENT: No icterus, No thrush, No neck mass, Pelzer/AT  Cardiovascular: RRR, S1/S2, no rubs, no gallops  Respiratory: scattered bilateral rales. No wheeze  Abdomen: Soft/+BS, non tender, non distended, no guarding  Extremities: No edema, No lymphangitis, No petechiae, No rashes, no synovitis   Data Reviewed: I have personally reviewed following labs and imaging studies Basic Metabolic Panel: Recent Labs  Lab 02/14/20 0728 02/16/20 1338 02/18/20 2123 02/19/20 0804 02/20/20 0712  NA 133* 131* 134* 135 134*  K 4.3 4.6 4.4 4.2 4.2  CL 101 96* 97* 102 102  CO2 24 26 27 28 26   GLUCOSE 142* 122* 157* 100* 80  BUN 24* 23 40* 34* 21  CREATININE 1.44* 1.62* 1.88* 1.58* 1.26*  CALCIUM 9.1 9.2 8.7* 8.6* 8.1*   Liver Function Tests: Recent Labs  Lab 02/16/20 1338 02/20/20 0712  AST 46* 36  ALT 47* 38  ALKPHOS 79 52  BILITOT 0.7 0.8  PROT 6.5 4.7*  ALBUMIN 3.7 2.7*   No results for input(s): LIPASE, AMYLASE in the last 168 hours. No results for input(s): AMMONIA in the last 168 hours. Coagulation Profile: Recent Labs  Lab 02/19/20 0804  INR 1.3*   CBC: Recent Labs  Lab 02/14/20 0728 02/14/20 0728 02/16/20 1338 02/16/20 1338 02/18/20 2123 02/18/20 2123 02/19/20 0120 02/19/20 0804 02/19/20 1623 02/20/20 0040 02/20/20 0712  WBC 4.5  --  8.3  --  6.4  --   --   --   --   --   --   NEUTROABS  --   --  7.5  --  5.6  --   --   --   --   --   --   HGB 9.3*   < > 9.7*   < > 9.1*   < > 8.1* 7.9* 8.7* 7.8* 8.0*  HCT 29.9*   < > 30.9*   < > 28.8*   < > 26.1* 25.4* 27.9* 25.4* 25.8*  MCV 99.0  --  98.7  --  99.3  --   --   --   --   --   --   PLT 175  --  210  --  225  --   --   --   --   --   --    < > = values in this interval not displayed.   Cardiac Enzymes: No results for input(s): CKTOTAL, CKMB, CKMBINDEX, TROPONINI in the last 168  hours. BNP: Invalid input(s): POCBNP CBG: No results for input(s): GLUCAP in the last 168 hours. HbA1C: No results for input(s): HGBA1C in the last 72 hours. Urine analysis:    Component Value Date/Time   COLORURINE YELLOW 02/12/2020 1353   APPEARANCEUR CLEAR 02/12/2020 1353   APPEARANCEUR Hazy (A) 02/09/2020 1400   LABSPEC 1.012 02/12/2020 1353   PHURINE 7.0 02/12/2020 1353   GLUCOSEU NEGATIVE 02/12/2020 1353   HGBUR NEGATIVE 02/12/2020 1353   BILIRUBINUR NEGATIVE 02/12/2020 1353   BILIRUBINUR Negative 02/09/2020 1400   KETONESUR NEGATIVE 02/12/2020 1353   PROTEINUR NEGATIVE 02/12/2020 1353   NITRITE NEGATIVE 02/12/2020 1353   LEUKOCYTESUR NEGATIVE 02/12/2020 1353   Sepsis Labs: @LABRCNTIP (procalcitonin:4,lacticidven:4) )  Recent Results (from the past 240 hour(s))  SARS Coronavirus 2 by RT PCR (hospital order, performed in [facility] hospital lab) Nasopharyngeal Nasopharyngeal Swab     Status: None   Collection Time: 02/12/20  7:57 AM   Specimen: Nasopharyngeal Swab  Result Value Ref Range Status   SARS Coronavirus 2 NEGATIVE NEGATIVE Final    Comment: (NOTE) SARS-CoV-2 target nucleic acids are NOT DETECTED.  The SARS-CoV-2 RNA is generally detectable in upper and lower respiratory specimens during the acute phase of infection. The lowest concentration of SARS-CoV-2 viral copies this assay can detect is 250 copies / mL. A negative result does not preclude SARS-CoV-2 infection and should not be used as the sole basis for treatment or other patient management decisions.  A negative result may occur with improper specimen collection / handling, submission of specimen other than nasopharyngeal swab, presence of viral mutation(s) within the areas targeted by this assay, and inadequate number of viral copies (<250 copies / mL). A negative result must be combined with clinical observations, patient history, and epidemiological information.  Fact Sheet for Patients:    StrictlyIdeas.no  Fact Sheet for Healthcare Providers: BankingDealers.co.za  This test is not yet approved or  cleared by the [place] FDA and has been authorized for detection and/or diagnosis of SARS-CoV-2 by FDA under an Emergency Use Authorization (EUA).  This EUA will remain in effect (meaning this test can be used) for the duration of the COVID-19 declaration under Section 564(b)(1) of the Act, 21 U.S.C. section 360bbb-3(b)(1), unless the authorization is terminated or revoked sooner.  Performed at [facility], [address] [place], [place] [address]   Culture, blood (routine x 2) Call MD if unable to obtain prior to antibiotics being given     Status: None   Collection Time: 02/12/20 11:03 AM   Specimen: Right Antecubital; Blood  Result Value Ref Range Status   Specimen Description RIGHT ANTECUBITAL  Final   Special Requests   Final    BOTTLES DRAWN AEROBIC AND ANAEROBIC Blood Culture adequate volume   Culture   Final    NO GROWTH 5 DAYS Performed at [facility], [address] [place], [place] [address]    Report Status 02/17/2020 FINAL  Final  Culture, blood (routine x 2) Call MD if unable to obtain prior to antibiotics being given     Status: None   Collection Time: 02/12/20 11:04 AM   Specimen: BLOOD RIGHT WRIST  Result Value Ref Range Status   Specimen Description BLOOD RIGHT WRIST  Final   Special Requests   Final    BOTTLES DRAWN AEROBIC AND ANAEROBIC Blood Culture adequate volume   Culture   Final    NO GROWTH 5 DAYS Performed at [facility] [address], [place], [place] [address]    Report Status 02/17/2020 FINAL  Final  Urine culture     Status: None   Collection Time: 02/12/20  1:53 PM   Specimen: Urine, Clean Catch  Result Value Ref Range Status   Specimen Description   Final    URINE, CLEAN CATCH Performed at [facility], [address], [place], [place] [address]    Special Requests    Final    NONE Performed at [facility], [address], [place], [place] [address]    Culture   Final    NO GROWTH Performed at [facility], [place] [address], [place],  [address]    Report Status 02/13/2020 FINAL  Final  SARS Coronavirus 2 by RT PCR (hospital order, performed in [facility] hospital lab) Nasopharyngeal Nasopharyngeal Swab     Status: None   Collection Time: 02/18/20 11:35 PM   Specimen: Nasopharyngeal Swab  Result Value Ref Range Status   SARS Coronavirus 2 NEGATIVE NEGATIVE Final    Comment: (NOTE) SARS-CoV-2 target nucleic acids are NOT DETECTED.  The SARS-CoV-2 RNA is generally detectable in upper and lower respiratory specimens during the acute phase of infection. The lowest concentration of SARS-CoV-2 viral copies this assay can detect is 250 copies / mL. A negative result does not preclude SARS-CoV-2 infection and should not be used as the sole basis for treatment or other patient management decisions.  A negative result may occur with improper specimen collection / handling, submission of specimen other than nasopharyngeal swab, presence of viral mutation(s) within the areas targeted by this assay, and inadequate number of viral copies (<250 copies / mL). A negative result must be combined with clinical observations, patient history, and epidemiological information.  Fact Sheet for Patients:   StrictlyIdeas.no  Fact Sheet for Healthcare Providers: BankingDealers.co.za  This test is not yet approved or  cleared by the [place] FDA and has been authorized for detection and/or diagnosis of SARS-CoV-2 by FDA under an Emergency Use Authorization (EUA).  This EUA will remain in effect (meaning this test can be used) for the duration of the COVID-19 declaration under Section 564(b)(1) of the Act, 21 U.S.C. section 360bbb-3(b)(1), unless the authorization is terminated or revoked  sooner.  Performed at [facility] [address] [place], [place] [address]      Scheduled Meds: . budesonide (PULMICORT) nebulizer solution  0.5 mg Nebulization BID  . ipratropium-albuterol  3 mL Nebulization Q6H  . levothyroxine  25 mcg Oral Daily  . memantine  10 mg Oral BID  . metoprolol tartrate  25 mg Oral BID  . pantoprazole (PROTONIX) IV  40 mg Intravenous Q12H   Continuous Infusions: . sodium chloride 50 mL/hr at 02/20/20 1111  . famotidine (PEPCID) IV 20 mg (02/20/20 0843)    Procedures/Studies: DG Chest Port 1 View  Result Date: 02/12/2020 CLINICAL DATA:  Shortness of breath. EXAM: PORTABLE CHEST 1 VIEW COMPARISON:  12/13/2019.  10/19/2019. FINDINGS: Cardiac pacer stable position. Prior cardiac valve replacement. Stable cardiomegaly. No pulmonary venous congestion. Bilateral upper lobe pulmonary infiltrates noted on today's exam. No pleural effusion or pneumothorax. IMPRESSION: 1. Cardiac pacer stable position. Prior cardiac valve replacement. Stable cardiomegaly. No pulmonary venous congestion. 2. Bilateral upper lobe pulmonary infiltrates noted on today's exam. Electronically Signed   By: [employer]   On: 02/12/2020 07:21   CUP PACEART REMOTE DEVICE CHECK  Result Date: 02/10/2020 Scheduled remote reviewed. Normal device function.  Known persistent AF, not OAC candidate. Next remote 91 days. Osvaldo Human, DO  [facility]  If 7PM-7AM, please contact night-coverage [email or web address] Password [email or web address] 02/20/2020, 2:16 PM   LOS: 1 day

## 2020-02-21 DIAGNOSIS — Z950 Presence of cardiac pacemaker: Secondary | ICD-10-CM

## 2020-02-21 DIAGNOSIS — I480 Paroxysmal atrial fibrillation: Secondary | ICD-10-CM

## 2020-02-21 DIAGNOSIS — K921 Melena: Secondary | ICD-10-CM

## 2020-02-21 LAB — HEMOGLOBIN AND HEMATOCRIT, BLOOD
HCT: 27.5 % — ABNORMAL LOW (ref 36.0–46.0)
HCT: 25.1 % — ABNORMAL LOW (ref 36.0–46.0)
Hemoglobin: 8.5 g/dL — ABNORMAL LOW (ref 12.0–15.0)
Hemoglobin: 8 g/dL — ABNORMAL LOW (ref 12.0–15.0)

## 2020-02-21 MED ORDER — PANTOPRAZOLE SODIUM 40 MG PO TBEC
40.0000 mg | DELAYED_RELEASE_TABLET | Freq: Two times a day (BID) | ORAL | 0 refills | Status: DC
Start: 2020-02-21 — End: 2020-06-14

## 2020-02-21 MED ORDER — IPRATROPIUM-ALBUTEROL 0.5-2.5 (3) MG/3ML IN SOLN
3.0000 mL | Freq: Three times a day (TID) | RESPIRATORY_TRACT | Status: DC
  Administered 2020-02-21: 3 mL via RESPIRATORY_TRACT
  Filled 2020-02-21: qty 3

## 2020-02-21 NOTE — Plan of Care (Signed)
Pt being discharged

## 2020-02-21 NOTE — Discharge Summary (Signed)
Physician Discharge Summary  Crystal Cooper:[id number] DOB: 25-Oct-1926 DOA: 02/18/2020  PCP: [name], MD  Admit date: 02/18/2020 Discharge date: 02/21/2020  Admitted From: Home Disposition:  Home   Recommendations for Outpatient Follow-up:  1. Follow up with PCP in 1-2 weeks 2. 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 She noticed that for the last two days shehadrectal bleedingsinceyesterdayin multiple episodes.In addition, the patient had 3 bloody bowel movements on 02/18/2020. The patient contacted her PCP who directed the patient to go to the emergency department. Notably, the patient has been treated with apixaban for atrial fibrillation.She suffered a right MCA stroke in March 2021 for which apixaban was recommended to continue. However, she had an admission to the hospital from 10/10/2019 to 10/14/2019 for a GI bleed with acute blood loss anemia. She underwent EGD which showed erosive gastritis with a small angiectasia in the greater curvature of the stomach status post APC at that time. She followed up with her cardiology physician on 01/22/2020. At that time, the decision was made to stop the patient's apixaban after discussion of the risk, benefits, and alternatives. Nevertheless, the patient denies any fevers, chills, chest pain, shortness breath, dizziness, abdominal pain. In the emergency department, the patient was noted to have hemoglobin of 8.1. She was afebrile  hemodynamically stable with oxygen saturation 98% room air. The patient was admitted for further evaluation and treatment. GI was consulted to assist with management.  Discharge Diagnoses:   Hematochezia/UGI Bleed -GI consultappreciated -Hgb dropped from 10.5>>9.1 on day of admit -Clear liquid diet>>advance to soft diet which pt tolerated -Continue IV Protonix>>po -02/19/20 EGD--oozing gastric ulcer with adherent clot tx with bipolar cautery -02/19/20--flex sig--stool form anus to transverse colon -continue to trend Hgb-->Hgb remains stable last 48 hours prior to d/c ~8 -had BM last night 8/28 without blood  Essential hypertension -hold furosemide during hospitalization--restart after d/c -continue metoprolol tartrate  Paroxysmal atrial fibrillation -Currently in sinus rhythm -She has been off apixaban since 01/22/2020  COPD -Stable on room air -incentive spirometry -restart duonebs -restart Breztri after d/c  Mixed vascular and Alzheimer's dementia -Follow-up with neurology -continue namenda  Junctional bradycardia status post TAVR -She has PPM  CKD stage IIIb -Baseline creatinine 1.3-1.6 -A.m. BMP -serum creatinine 1.26 at time of dc  Chronic diastolic CHF -Clinically euvolemic  Hypothyroidism -continue synthroid      Discharge Instructions   Allergies as of 02/21/2020      Reactions   Hctz [hydrochlorothiazide] Other (See Comments)   Pt was ill and this affected her kidneys   Aspirin Other (See Comments)   Cardiologist said the patient is to not take this   Codeine Other (See Comments)   Made the patient feel ill, has not had any problems since 1977      Medication List    STOP taking these medications   cefdinir 300 MG capsule Commonly known as: OMNICEF   OVER THE COUNTER MEDICATION   predniSONE 20 MG tablet Commonly known as: DELTASONE     TAKE these medications   acetaminophen 500 MG tablet Commonly known as: TYLENOL Take  500 mg by mouth every 6 (six) hours as needed for mild pain or headache.   albuterol 108 (90 Base) MCG/ACT inhaler Commonly known as: ProAir HFA Inhale 2 puffs into the lungs every 4 (four) hours as needed for wheezing or shortness of breath.   Biotin 10 MG Caps Take 10 mg by mouth daily.   Breztri Aerosphere 160-9-4.8 MCG/ACT Aero Generic drug: Budeson-Glycopyrrol-Formoterol Inhale 2 Inhalers into the lungs in the morning and at bedtime.   Caltrate 600+D3 600-800 MG-UNIT Tabs Generic drug: Calcium Carb-Cholecalciferol Take 1 tablet by mouth daily.   dextromethorphan-guaiFENesin 30-600 MG 12hr tablet Commonly known as: MUCINEX DM Take 1 tablet by mouth 2 (two) times daily for 7 days.   famotidine 20 MG tablet Commonly known as: Pepcid Take 1 tablet (20 mg total) by mouth at bedtime.   ferrous sulfate 325 (65 FE) MG tablet Take 1 tablet (325 mg total) by mouth 2 (two) times daily with a meal.   furosemide 20 MG tablet Commonly known as: LASIX Take 3 tablets (60 mg total) by mouth daily.   levothyroxine 25 MCG tablet Commonly known as: SYNTHROID Take 1 tablet (25 mcg total) by mouth daily.   memantine 10 MG tablet Commonly known as: NAMENDA Take 1 tablet (10 mg total) by mouth 2 (two) times daily.   metoprolol tartrate 25 MG tablet Commonly known as: LOPRESSOR Take 1 tablet (25 mg total) by mouth 2 (two) times daily.   multivitamin-iron-minerals-folic acid chewable tablet Chew 1 tablet by mouth daily.   pantoprazole 40 MG tablet Commonly known as: PROTONIX Take 1 tablet (40 mg total) by mouth 2 (two) times daily.   potassium chloride 10 MEQ tablet Commonly known as: KLOR-CON Take 2 tablets (20 mEq total) by mouth 2 (two) times daily.       Allergies  Allergen Reactions  . Hctz [Hydrochlorothiazide] Other (See Comments)    Pt was ill and this affected her kidneys   . Aspirin Other (See Comments)    Cardiologist said the patient is to not take this  .  Codeine Other (See Comments)    Made the patient feel ill, has not had any problems since 1977    Consultations:  GI   Procedures/Studies: DG Chest Port 1 View  Result Date: 02/12/2020 CLINICAL DATA:  Shortness of breath. EXAM: PORTABLE CHEST 1 VIEW COMPARISON:  12/13/2019.  10/19/2019. FINDINGS: Cardiac pacer stable position. Prior cardiac valve replacement. Stable cardiomegaly. No pulmonary venous congestion. Bilateral upper lobe pulmonary infiltrates noted on today's exam. No pleural effusion or pneumothorax. IMPRESSION: 1. Cardiac pacer stable position. Prior cardiac valve replacement. Stable cardiomegaly. No pulmonary venous congestion. 2. Bilateral upper lobe pulmonary infiltrates noted on today's exam. Electronically Signed   By: [place]   On: 02/12/2020 07:21   CUP PACEART REMOTE DEVICE CHECK  Result Date: 02/10/2020 Scheduled remote reviewed. Normal device function.  Known persistent AF, not OAC candidate. Next remote 91 days. JM       Discharge Exam: Vitals:   02/21/20 0617 02/21/20 0843  BP: 135/89   Pulse: 73   Resp: 18   Temp: 98.8 F (37.1 C)   SpO2: 100% 91%   Vitals:   02/20/20 1503 02/20/20 1927 02/21/20 0617 02/21/20 0843  BP:   135/89   Pulse:  63 73   Resp:  18 18   Temp:   98.8 F (37.1 C)   TempSrc:      SpO2: 97% 97% 100% 91%  Weight:  Height:        General: Pt is alert, awake, not in acute distress Cardiovascular: RRR, S1/S2 +, no rubs, no gallops Respiratory: bibasilar rales. No wheeze Abdominal: Soft, NT, ND, bowel sounds + Extremities: no edema, no cyanosis   The results of significant diagnostics from this hospitalization (including imaging, microbiology, ancillary and laboratory) are listed below for reference.    Significant Diagnostic Studies: DG Chest Port 1 View  Result Date: 02/12/2020 CLINICAL DATA:  Shortness of breath. EXAM: PORTABLE CHEST 1 VIEW COMPARISON:  12/13/2019.  10/19/2019. FINDINGS: Cardiac pacer  stable position. Prior cardiac valve replacement. Stable cardiomegaly. No pulmonary venous congestion. Bilateral upper lobe pulmonary infiltrates noted on today's exam. No pleural effusion or pneumothorax. IMPRESSION: 1. Cardiac pacer stable position. Prior cardiac valve replacement. Stable cardiomegaly. No pulmonary venous congestion. 2. Bilateral upper lobe pulmonary infiltrates noted on today's exam. Electronically Signed   By: [place]   On: 02/12/2020 07:21   CUP PACEART REMOTE DEVICE CHECK  Result Date: 02/10/2020 Scheduled remote reviewed. Normal device function.  Known persistent AF, not OAC candidate. Next remote 91 days. JM    Microbiology: Recent Results (from the past 240 hour(s))  SARS Coronavirus 2 by RT PCR (hospital order, performed in [facility] hospital lab) Nasopharyngeal Nasopharyngeal Swab     Status: None   Collection Time: 02/12/20  7:57 AM   Specimen: Nasopharyngeal Swab  Result Value Ref Range Status   SARS Coronavirus 2 NEGATIVE NEGATIVE Final    Comment: (NOTE) SARS-CoV-2 target nucleic acids are NOT DETECTED.  The SARS-CoV-2 RNA is generally detectable in upper and lower respiratory specimens during the acute phase of infection. The lowest concentration of SARS-CoV-2 viral copies this assay can detect is 250 copies / mL. A negative result does not preclude SARS-CoV-2 infection and should not be used as the sole basis for treatment or other patient management decisions.  A negative result may occur with improper specimen collection / handling, submission of specimen other than nasopharyngeal swab, presence of viral mutation(s) within the areas targeted by this assay, and inadequate number of viral copies (<250 copies / mL). A negative result must be combined with clinical observations, patient history, and epidemiological information.  Fact Sheet for Patients:   StrictlyIdeas.no  Fact Sheet for Healthcare  Providers: BankingDealers.co.za  This test is not yet approved or  cleared by the [place] FDA and has been authorized for detection and/or diagnosis of SARS-CoV-2 by FDA under an Emergency Use Authorization (EUA).  This EUA will remain in effect (meaning this test can be used) for the duration of the COVID-19 declaration under Section 564(b)(1) of the Act, 21 U.S.C. section 360bbb-3(b)(1), unless the authorization is terminated or revoked sooner.  Performed at [facility], [address] [place], [place] [address]   Culture, blood (routine x 2) Call MD if unable to obtain prior to antibiotics being given     Status: None   Collection Time: 02/12/20 11:03 AM   Specimen: Right Antecubital; Blood  Result Value Ref Range Status   Specimen Description RIGHT ANTECUBITAL  Final   Special Requests   Final    BOTTLES DRAWN AEROBIC AND ANAEROBIC Blood Culture adequate volume   Culture   Final    NO GROWTH 5 DAYS Performed at [facility], [address] [place], [place] [address]    Report Status 02/17/2020 FINAL  Final  Culture, blood (routine x 2) Call MD if unable to obtain prior to antibiotics being given  Status: None   Collection Time: 02/12/20 11:04 AM   Specimen: BLOOD RIGHT WRIST  Result Value Ref Range Status   Specimen Description BLOOD RIGHT WRIST  Final   Special Requests   Final    BOTTLES DRAWN AEROBIC AND ANAEROBIC Blood Culture adequate volume   Culture   Final    NO GROWTH 5 DAYS Performed at [facility], [address], [place], [place] [address]    Report Status 02/17/2020 FINAL  Final  Urine culture     Status: None   Collection Time: 02/12/20  1:53 PM   Specimen: Urine, Clean Catch  Result Value Ref Range Status   Specimen Description   Final    URINE, CLEAN CATCH Performed at [facility], [address], [place], [place] [address]    Special Requests   Final    NONE Performed at [facility], [address] [place],  [place] [address]    Culture   Final    NO GROWTH Performed at [facility], [place] [address] [place], [place] [address]    Report Status 02/13/2020 FINAL  Final  SARS Coronavirus 2 by RT PCR (hospital order, performed in [facility] hospital lab) Nasopharyngeal Nasopharyngeal Swab     Status: None   Collection Time: 02/18/20 11:35 PM   Specimen: Nasopharyngeal Swab  Result Value Ref Range Status   SARS Coronavirus 2 NEGATIVE NEGATIVE Final    Comment: (NOTE) SARS-CoV-2 target nucleic acids are NOT DETECTED.  The SARS-CoV-2 RNA is generally detectable in upper and lower respiratory specimens during the acute phase of infection. The lowest concentration of SARS-CoV-2 viral copies this assay can detect is 250 copies / mL. A negative result does not preclude SARS-CoV-2 infection and should not be used as the sole basis for treatment or other patient management decisions.  A negative result may occur with improper specimen collection / handling, submission of specimen other than nasopharyngeal swab, presence of viral mutation(s) within the areas targeted by this assay, and inadequate number of viral copies (<250 copies / mL). A negative result must be combined with clinical observations, patient history, and epidemiological information.  Fact Sheet for Patients:   StrictlyIdeas.no  Fact Sheet for Healthcare Providers: BankingDealers.co.za  This test is not yet approved or  cleared by the [place] FDA and has been authorized for detection and/or diagnosis of SARS-CoV-2 by FDA under an Emergency Use Authorization (EUA).  This EUA will remain in effect (meaning this test can be used) for the duration of the COVID-19 declaration under Section 564(b)(1) of the Act, 21 U.S.C. section 360bbb-3(b)(1), unless the authorization is terminated or revoked sooner.  Performed at [facility], [address] [place],  [address]       Labs: Basic Metabolic Panel: Recent Labs  Lab 02/16/20 1338 02/16/20 1338 02/18/20 2123 02/18/20 2123 02/19/20 0804 02/20/20 0712  NA 131*  --  134*  --  135 134*  K 4.6   < > 4.4   < > 4.2 4.2  CL 96*  --  97*  --  102 102  CO2 26  --  27  --  28 26  GLUCOSE 122*  --  157*  --  100* 80  BUN 23  --  40*  --  34* 21  CREATININE 1.62*  --  1.88*  --  1.58* 1.26*  CALCIUM 9.2  --  8.7*  --  8.6* 8.1*   < > = values in this interval not  displayed.   Liver Function Tests: Recent Labs  Lab 02/16/20 1338 02/20/20 0712  AST 46* 36  ALT 47* 38  ALKPHOS 79 52  BILITOT 0.7 0.8  PROT 6.5 4.7*  ALBUMIN 3.7 2.7*   No results for input(s): LIPASE, AMYLASE in the last 168 hours. No results for input(s): AMMONIA in the last 168 hours. CBC: Recent Labs  Lab 02/16/20 1338 02/16/20 1338 02/18/20 2123 02/19/20 0120 02/20/20 0040 02/20/20 0712 02/20/20 1642 02/21/20 0100 02/21/20 0800  WBC 8.3  --  6.4  --   --   --   --   --   --   NEUTROABS 7.5  --  5.6  --   --   --   --   --   --   HGB 9.7*   < > 9.1*   < > 7.8* 8.0* 8.7* 8.5* 8.0*  HCT 30.9*   < > 28.8*   < > 25.4* 25.8* 27.9* 27.5* 25.1*  MCV 98.7  --  99.3  --   --   --   --   --   --   PLT 210  --  225  --   --   --   --   --   --    < > = values in this interval not displayed.   Cardiac Enzymes: No results for input(s): CKTOTAL, CKMB, CKMBINDEX, TROPONINI in the last 168 hours. BNP: Invalid input(s): POCBNP CBG: No results for input(s): GLUCAP in the last 168 hours.  Time coordinating discharge:  36 minutes  Signed:  Orson Eva, DO [facility] Pager: [phone] 02/21/2020, 9:49 AM

## 2020-02-21 NOTE — Progress Notes (Signed)
Nsg Discharge Note  Admit Date:  02/18/2020 Discharge date: 02/21/2020   Crystal Cooper to be D/C'd Home per MD order.  AVS completed.  Patient's daughter Crystal Cooper able to verbalize understanding.  Discharge Medication: Allergies as of 02/21/2020      Reactions   Hctz [hydrochlorothiazide] Other (See Comments)   Pt was ill and this affected her kidneys   Aspirin Other (See Comments)   Cardiologist said the patient is to not take this   Codeine Other (See Comments)   Made the patient feel ill, has not had any problems since 1977      Medication List    STOP taking these medications   cefdinir 300 MG capsule Commonly known as: OMNICEF   OVER THE COUNTER MEDICATION   predniSONE 20 MG tablet Commonly known as: DELTASONE     TAKE these medications   acetaminophen 500 MG tablet Commonly known as: TYLENOL Take 500 mg by mouth every 6 (six) hours as needed for mild pain or headache.   albuterol 108 (90 Base) MCG/ACT inhaler Commonly known as: ProAir HFA Inhale 2 puffs into the lungs every 4 (four) hours as needed for wheezing or shortness of breath.   Biotin 10 MG Caps Take 10 mg by mouth daily.   Breztri Aerosphere 160-9-4.8 MCG/ACT Aero Generic drug: Budeson-Glycopyrrol-Formoterol Inhale 2 Inhalers into the lungs in the morning and at bedtime.   Caltrate 600+D3 600-800 MG-UNIT Tabs Generic drug: Calcium Carb-Cholecalciferol Take 1 tablet by mouth daily.   dextromethorphan-guaiFENesin 30-600 MG 12hr tablet Commonly known as: MUCINEX DM Take 1 tablet by mouth 2 (two) times daily for 7 days.   famotidine 20 MG tablet Commonly known as: Pepcid Take 1 tablet (20 mg total) by mouth at bedtime.   ferrous sulfate 325 (65 FE) MG tablet Take 1 tablet (325 mg total) by mouth 2 (two) times daily with a meal.   furosemide 20 MG tablet Commonly known as: LASIX Take 3 tablets (60 mg total) by mouth daily.   levothyroxine 25 MCG tablet Commonly known as: SYNTHROID Take 1  tablet (25 mcg total) by mouth daily.   memantine 10 MG tablet Commonly known as: NAMENDA Take 1 tablet (10 mg total) by mouth 2 (two) times daily.   metoprolol tartrate 25 MG tablet Commonly known as: LOPRESSOR Take 1 tablet (25 mg total) by mouth 2 (two) times daily.   multivitamin-iron-minerals-folic acid chewable tablet Chew 1 tablet by mouth daily.   pantoprazole 40 MG tablet Commonly known as: PROTONIX Take 1 tablet (40 mg total) by mouth 2 (two) times daily.   potassium chloride 10 MEQ tablet Commonly known as: KLOR-CON Take 2 tablets (20 mEq total) by mouth 2 (two) times daily.       Discharge Assessment: Vitals:   02/21/20 0617 02/21/20 0843  BP: 135/89   Pulse: 73   Resp: 18   Temp: 98.8 F (37.1 C)   SpO2: 100% 91%   Skin clean, dry and intact without evidence of skin break down, no evidence of skin tears noted. IV catheter discontinued intact. Site without signs and symptoms of complications - no redness or edema noted at insertion site, patient denies c/o pain - only slight tenderness at site.  Dressing with slight pressure applied.  D/c Instructions-Education: Discharge instructions given to patient's daughter Crystal Cooper with verbalized understanding. D/c education completed with patient's family including follow up instructions, medication list, d/c activities limitations if indicated, with other d/c instructions as indicated by MD - patient able to  verbalize understanding, all questions fully answered. Patient instructed to return to ED, call 911, or call MD for any changes in condition.  Patient escorted via [place], and D/C home via private auto.  Berton Bon, RN 02/21/2020 12:15 PM

## 2020-02-22 ENCOUNTER — Telehealth: Payer: Self-pay | Admitting: Cardiology

## 2020-02-22 ENCOUNTER — Telehealth: Payer: Self-pay | Admitting: Family Medicine

## 2020-02-22 ENCOUNTER — Encounter (HOSPITAL_COMMUNITY): Payer: Self-pay | Admitting: Gastroenterology

## 2020-02-22 DIAGNOSIS — R600 Localized edema: Secondary | ICD-10-CM

## 2020-02-22 MED ORDER — FUROSEMIDE 20 MG PO TABS: 80.0000 mg | ORAL_TABLET | Freq: Every day | ORAL | 3 refills | Status: DC

## 2020-02-22 NOTE — Telephone Encounter (Signed)
Spoke with patients daughter.  Daughter states that patient has gained 6 lbs since Thursday.   Some lower extremity and bilateral hand edema.  Dyspnea walking from restroom to living room.  Daughter will contact cardiology as well and call back with Dr. Lonia Skinner recommendations.

## 2020-02-22 NOTE — Telephone Encounter (Signed)
Called patient, LVM to call back to discuss.  Left call back number.   

## 2020-02-22 NOTE — Telephone Encounter (Signed)
Pt c/o swelling: STAT is pt has developed SOB within 24 hours  1) How much weight have you gained and in what time span? Do not think she gained any weight- just discharged from [facility] on yesterday- not heart related  2) If swelling, where is the swelling located? legs  3) Are you currently taking a fluid pill?  yes  4) Are you currently SOB?  Yes- when she walks, very short of breath  5) Do you have a log of your daily weights (if so, list)?  no 6) Have you gained 3 pounds in a day or 5 pounds in a week?  She does not think so  7) Have you traveled recently? no

## 2020-02-22 NOTE — Telephone Encounter (Signed)
Patient was recently seen in the hospital for bleeding ulcer, they were able to fix this issue- but since being home she has had swelling. The daughter is unsure if the Lasix was given to her yesterday because she was not up using the restroom like normal last night- and now she is up to 120lbs, from the 114-115 that she normally is. Patient states her legs are swelling, and she is SOB when doing activities, no other complaints at this time. Advised I would check in with Dr.Crenshaw to see if an extra lasix dose is appropriate, and would call back to make them aware.  Daughter gave me another number to contact [phone]. Will send to MD to advise.  Thanks!

## 2020-02-22 NOTE — Telephone Encounter (Signed)
Spoke with pt daughter, Aware of dr crenshaw's recommendations.  °Lab orders mailed to the pt  °

## 2020-02-22 NOTE — Telephone Encounter (Signed)
Take an additional 60 mg of Lasix today.  Increase Lasix starting tomorrow to 80 mg daily.  Check potassium and renal function in 1 week. [name]

## 2020-02-22 NOTE — Telephone Encounter (Signed)
Crystal Cooper states mom has gained 5+ lbs in the last 4 days and was released recently from the hospital. She is wanting advice on what they should do. Ankles not swollen, legs a little swollen, but not much. Little swelling in hands. Breathing is labored when standing to walk. Sitting breathing is good.

## 2020-02-23 ENCOUNTER — Ambulatory Visit (INDEPENDENT_AMBULATORY_CARE_PROVIDER_SITE_OTHER): Payer: Medicare Other | Admitting: Licensed Clinical Social Worker

## 2020-02-23 ENCOUNTER — Ambulatory Visit (HOSPITAL_COMMUNITY): Payer: Medicare Other | Admitting: Hematology

## 2020-02-23 ENCOUNTER — Other Ambulatory Visit: Payer: Self-pay

## 2020-02-23 DIAGNOSIS — I5032 Chronic diastolic (congestive) heart failure: Secondary | ICD-10-CM | POA: Diagnosis not present

## 2020-02-23 DIAGNOSIS — I10 Essential (primary) hypertension: Secondary | ICD-10-CM

## 2020-02-23 DIAGNOSIS — N1832 Chronic kidney disease, stage 3b: Secondary | ICD-10-CM

## 2020-02-23 DIAGNOSIS — E871 Hypo-osmolality and hyponatremia: Secondary | ICD-10-CM

## 2020-02-23 DIAGNOSIS — K219 Gastro-esophageal reflux disease without esophagitis: Secondary | ICD-10-CM

## 2020-02-23 LAB — SURGICAL PATHOLOGY

## 2020-02-23 NOTE — Patient Instructions (Addendum)
Licensed Clinical Social Worker Visit Information  Goals we discussed today:      Client will communicate with LCSW in next 30 days to talk about ADLs completion and challenges in doing daily activities of clinet (pt-stated)          Current Barriers:   Challenges with ADLs completion in client with Chronic Diagnoses of  HTN< CKD, Hyponatremia, GERD, CHF  Some memory challenges  Clinical Social Work Clinical Goal(s):   LCSW will communicate with client in next 30 days to talk with client about ADLs completion and challenges in doing daily activities for client  Interventions:   Discussed CCM program supportwith Synetta Fail, daughter of client   Encouraged client/[name]/[name] Burroughsto communicate with RNCM as needed for nursing support  Talked withDale about ADLs completion dailyfor client  Talked with [place] about social support networkfor client(client's daughtersare supportive)  Talked withDale about ambulation challenges of client  Talked with [place] about appetite of client  Talked with [place] about pain issues of client  Talked with [place] about client decreased energy  Talked with [place] about client relaxation techniques (client likes to read and do word searches)  Talked with [place]e about client's upcoming medical appointments  Talked with [place] about client support with Meals on Wheels   Patient Self Care Activities:  Attends scheduled medical appointments   SELF CARE DEFICITS: Challenges in ADLs completion Some mobility challenges    Initial goal documentation    Follow Up Plan: LCSW to call client/daughterin next 4 weeks to talk with client /daughterabout daily ADLs completion of client and aboutclientchallenges in doing daily activities.  Materials Provided: No  The patient/[name] Throckmorton, daughter verbalized understanding of instructions provided today and declined a print copy of patient instruction materials.    [name].Hal Norrington MSW, LCSW Licensed Clinical Social Worker [facility] [phone]

## 2020-02-23 NOTE — Chronic Care Management (AMB) (Signed)
Chronic Care Management    Clinical Social Work Follow Up Note  02/23/2020 Name: Crystal Cooper MRN: [id number] DOB: [birth year]  Crystal Cooper is a 86 y.o. year old female who is a primary care patient of Stacks, [name], MD. The CCM team was consulted for assistance with [employer] .   Review of patient status, including review of consultants reports, other relevant assessments, and collaboration with appropriate care team members and the patient's provider was performed as part of comprehensive patient evaluation and provision of chronic care management services.    SDOH (Social Determinants of Health) assessments performed: No; risk for physical inactivity; risk for stress; risk for depression ; risk for tobacco use      Chronic Care Management from 09/11/2019 in [place]  PHQ-9 Total Score 5     GAD 7 : Generalized Anxiety Score 09/11/2019  Nervous, Anxious, on Edge 1  Control/stop worrying 1  Worry too much - different things 1  Trouble relaxing 0  Restless 0  Easily annoyed or irritable 0  Afraid - awful might happen 0  Total GAD 7 Score 3  Anxiety Difficulty Somewhat difficult    Outpatient Encounter Medications as of 02/23/2020  Medication Sig  . acetaminophen (TYLENOL) 500 MG tablet Take 500 mg by mouth every 6 (six) hours as needed for mild pain or headache.   . albuterol (PROAIR HFA) 108 (90 Base) MCG/ACT inhaler Inhale 2 puffs into the lungs every 4 (four) hours as needed for wheezing or shortness of breath.  . Biotin 10 MG CAPS Take 10 mg by mouth daily.   . Budeson-Glycopyrrol-Formoterol (BREZTRI AEROSPHERE) 160-9-4.8 MCG/ACT AERO Inhale 2 Inhalers into the lungs in the morning and at bedtime.  . Calcium Carb-Cholecalciferol (CALTRATE 600+D3) 600-800 MG-UNIT TABS Take 1 tablet by mouth daily.  . famotidine (PEPCID) 20 MG tablet Take 1 tablet (20 mg total) by mouth at bedtime.  . ferrous sulfate 325 (65 FE) MG tablet Take 1 tablet  (325 mg total) by mouth 2 (two) times daily with a meal.  . furosemide (LASIX) 20 MG tablet Take 4 tablets (80 mg total) by mouth daily.  [name] levothyroxine (SYNTHROID) 25 MCG tablet Take 1 tablet (25 mcg total) by mouth daily.  . memantine (NAMENDA) 10 MG tablet Take 1 tablet (10 mg total) by mouth 2 (two) times daily.  . metoprolol tartrate (LOPRESSOR) 25 MG tablet Take 1 tablet (25 mg total) by mouth 2 (two) times daily.  . multivitamin-iron-minerals-folic acid (CENTRUM) chewable tablet Chew 1 tablet by mouth daily.  . pantoprazole (PROTONIX) 40 MG tablet Take 1 tablet (40 mg total) by mouth 2 (two) times daily.  . potassium chloride SA (KLOR-CON) 10 MEQ tablet Take 2 tablets (20 mEq total) by mouth 2 (two) times daily.   No facility-administered encounter medications on file as of 02/23/2020.    Goals    .  Client will communicate with LCSW in next 30 days to talk about ADLs completion and challenges in doing daily activities of clinet (pt-stated)        Current Barriers:  . Challenges with ADLs completion in client with Chronic Diagnoses of  HTN< CKD, Hyponatremia, GERD, CHF . Some memory challenges  Clinical Social Work Clinical Goal(s):  [name] LCSW will communicate with client in next 30 days to talk with client about ADLs completion and challenges in doing daily activities for client  Interventions:   Discussed CCM program support with Synetta Fail, daughter of client  Encouraged client/[name]?Marin Roberts to communicate with RNCM as needed for nursing support  Talked with Quita Skye about ADLs completion daily for client  Talked with Quita Skye about social support network for client (client's daughters are supportive)  Talked with Quita Skye about ambulation challenges of client   Talked with Quita Skye about appetite of client  Talked with Quita Skye about pain issues of client . Talked with Quita Skye about client decreased energy . Talked with Quita Skye about client relaxation techniques (client likes to  read and do word searches) . Talked with Quita Skye about client's upcoming medical appointments . Talked with Quita Skye about client support with [employer] Whe[employer]   Patient Self Care Activities:  Attends scheduled medical appointments  . SELF CARE DEFICITS: Challenges in ADLs completion Some mobility challenges    Initial goal documentation    Follow Up Plan: LCSW to call client/daughterin next 4 weeks to talk with client /daughterabout daily ADLs completion of client and about client challenges in doing daily activities.  [name].Lamija Besse MSW, LCSW Licensed Clinical Social Worker [facility] [phone]

## 2020-02-24 ENCOUNTER — Encounter: Payer: Self-pay | Admitting: Family Medicine

## 2020-02-24 ENCOUNTER — Ambulatory Visit: Payer: Medicare Other | Admitting: Family Medicine

## 2020-02-24 ENCOUNTER — Ambulatory Visit (INDEPENDENT_AMBULATORY_CARE_PROVIDER_SITE_OTHER): Payer: Medicare Other | Admitting: Family Medicine

## 2020-02-24 DIAGNOSIS — J440 Chronic obstructive pulmonary disease with acute lower respiratory infection: Secondary | ICD-10-CM | POA: Diagnosis not present

## 2020-02-24 DIAGNOSIS — J441 Chronic obstructive pulmonary disease with (acute) exacerbation: Secondary | ICD-10-CM | POA: Diagnosis not present

## 2020-02-24 DIAGNOSIS — K922 Gastrointestinal hemorrhage, unspecified: Secondary | ICD-10-CM

## 2020-02-24 DIAGNOSIS — N3000 Acute cystitis without hematuria: Secondary | ICD-10-CM | POA: Diagnosis not present

## 2020-02-24 DIAGNOSIS — I5032 Chronic diastolic (congestive) heart failure: Secondary | ICD-10-CM | POA: Diagnosis not present

## 2020-02-24 DIAGNOSIS — J189 Pneumonia, unspecified organism: Secondary | ICD-10-CM

## 2020-02-24 DIAGNOSIS — I5040 Unspecified combined systolic (congestive) and diastolic (congestive) heart failure: Secondary | ICD-10-CM | POA: Diagnosis not present

## 2020-02-24 DIAGNOSIS — I6930 Unspecified sequelae of cerebral infarction: Secondary | ICD-10-CM

## 2020-02-24 DIAGNOSIS — I130 Hypertensive heart and chronic kidney disease with heart failure and stage 1 through stage 4 chronic kidney disease, or unspecified chronic kidney disease: Secondary | ICD-10-CM | POA: Diagnosis not present

## 2020-02-24 HISTORY — PX: HIP FRACTURE SURGERY: SHX118

## 2020-02-24 NOTE — Progress Notes (Signed)
Virtual Visit via telephone Note  I connected with Crystal Cooper on 02/24/20 at 1543 by telephone and verified that I am speaking with the correct person using two identifiers. Crystal Cooper is currently located at home and daughter Crystal Cooper are currently with her during visit. The provider, Fransisca Kaufmann Annie Roseboom, MD is located in their office at time of visit.  Call ended at 1557  I discussed the limitations, risks, security and privacy concerns of performing an evaluation and management service by telephone and the availability of in person appointments. I also discussed with the patient that there may be a patient responsible charge related to this service. The patient expressed understanding and agreed to proceed.   History and Present Illness: Patient is calling in for hospital follow up for 2 occasions.  She was in initially on 02/12/20 and then again on 02/18/20.  She had pneumonia initially and the 2nd time was for a GI bleeding and anemia.  She is taking iron. She was d/c'd on 02/14/20 and then again on 02/21/20.  She had breathing issues initially and then improved. Her weight is 118lbs.  They increased her lasix to 80 after hospital for fluid increase but is now down on her weight.  Her breathing is much better now. She is also not having any more breathing. She denies any coughing or congestion. She had flex sigmoidoscopy  No diagnosis found.  Outpatient Encounter Medications as of 02/24/2020  Medication Sig   acetaminophen (TYLENOL) 500 MG tablet Take 500 mg by mouth every 6 (six) hours as needed for mild pain or headache.    albuterol (PROAIR HFA) 108 (90 Base) MCG/ACT inhaler Inhale 2 puffs into the lungs every 4 (four) hours as needed for wheezing or shortness of breath.   Biotin 10 MG CAPS Take 10 mg by mouth daily.    Budeson-Glycopyrrol-Formoterol (BREZTRI AEROSPHERE) 160-9-4.8 MCG/ACT AERO Inhale 2 Inhalers into the lungs in the morning and at bedtime.   Calcium  Carb-Cholecalciferol (CALTRATE 600+D3) 600-800 MG-UNIT TABS Take 1 tablet by mouth daily.   famotidine (PEPCID) 20 MG tablet Take 1 tablet (20 mg total) by mouth at bedtime.   ferrous sulfate 325 (65 FE) MG tablet Take 1 tablet (325 mg total) by mouth 2 (two) times daily with a meal.   furosemide (LASIX) 20 MG tablet Take 4 tablets (80 mg total) by mouth daily.   levothyroxine (SYNTHROID) 25 MCG tablet Take 1 tablet (25 mcg total) by mouth daily.   memantine (NAMENDA) 10 MG tablet Take 1 tablet (10 mg total) by mouth 2 (two) times daily.   metoprolol tartrate (LOPRESSOR) 25 MG tablet Take 1 tablet (25 mg total) by mouth 2 (two) times daily.   multivitamin-iron-minerals-folic acid (CENTRUM) chewable tablet Chew 1 tablet by mouth daily.   pantoprazole (PROTONIX) 40 MG tablet Take 1 tablet (40 mg total) by mouth 2 (two) times daily.   potassium chloride SA (KLOR-CON) 10 MEQ tablet Take 2 tablets (20 mEq total) by mouth 2 (two) times daily.   No facility-administered encounter medications on file as of 02/24/2020.    Review of Systems  Constitutional: Negative for chills and fever.  HENT: Negative for congestion, ear discharge, ear pain, sinus pressure and sinus pain.   Eyes: Negative for redness and visual disturbance.  Respiratory: Negative for cough, chest tightness, shortness of breath and wheezing.   Cardiovascular: Negative for chest pain and leg swelling.  Musculoskeletal: Negative for back pain and gait problem.  Skin: Negative for  rash.  Neurological: Negative for light-headedness and headaches.  Psychiatric/Behavioral: Negative for agitation and behavioral problems.  All other systems reviewed and are negative.   Observations/Objective: Patient sounds comfortable and in no acute distress.   Assessment and Plan: Problem List Items Addressed This Visit      Cardiovascular and Mediastinum   Congestive heart failure ([place]) - Primary   Relevant Orders   CMP14+EGFR   CBC  with Differential/Platelet     Respiratory   CAP (community acquired pneumonia)     Digestive   GI bleed   Relevant Orders   CMP14+EGFR   CBC with Differential/Platelet      Go back to 40 mg lasix now that weight is down.   She has bayada from home health and nursing and PT. Follow up plan: Return in about 4 weeks (around 03/23/2020), or if symptoms worsen or fail to improve.   I discussed the assessment and treatment plan with the patient. The patient was provided an opportunity to ask questions and all were answered. The patient agreed with the plan and demonstrated an understanding of the instructions.   The patient was advised to call back or seek an in-person evaluation if the symptoms worsen or if the condition fails to improve as anticipated.  The above assessment and management plan was discussed with the patient. The patient verbalized understanding of and has agreed to the management plan. Patient is aware to call the clinic if symptoms persist or worsen. Patient is aware when to return to the clinic for a follow-up visit. Patient educated on when it is appropriate to go to the emergency department.    I provided 14 minutes of non-face-to-face time during this encounter.    Worthy Rancher, MD

## 2020-02-24 NOTE — Addendum Note (Signed)
Addended by: Caryl Pina on: 02/24/2020 03:58 PM   Modules accepted: Orders

## 2020-02-25 DIAGNOSIS — N3000 Acute cystitis without hematuria: Secondary | ICD-10-CM | POA: Diagnosis not present

## 2020-02-25 DIAGNOSIS — I130 Hypertensive heart and chronic kidney disease with heart failure and stage 1 through stage 4 chronic kidney disease, or unspecified chronic kidney disease: Secondary | ICD-10-CM | POA: Diagnosis not present

## 2020-02-25 DIAGNOSIS — J189 Pneumonia, unspecified organism: Secondary | ICD-10-CM | POA: Diagnosis not present

## 2020-02-25 DIAGNOSIS — J440 Chronic obstructive pulmonary disease with acute lower respiratory infection: Secondary | ICD-10-CM | POA: Diagnosis not present

## 2020-02-25 DIAGNOSIS — I5032 Chronic diastolic (congestive) heart failure: Secondary | ICD-10-CM | POA: Diagnosis not present

## 2020-02-25 DIAGNOSIS — J441 Chronic obstructive pulmonary disease with (acute) exacerbation: Secondary | ICD-10-CM | POA: Diagnosis not present

## 2020-02-26 ENCOUNTER — Telehealth: Payer: Self-pay | Admitting: Family Medicine

## 2020-02-26 NOTE — Telephone Encounter (Signed)
Pts daughter calling to see if someone can call her about a concoction that she can mix to go on her moms bottom. States Monia Pouch use to give them the concoction ingredients.

## 2020-02-28 NOTE — Telephone Encounter (Signed)
I went back through the chart for a year and found nothing similar to what was requested.  [name] may not have included it in her notes.  I went through the pharmaceutical history and found nothing similar throughout the entire pharmacy history.  I can make a recommendation of what I use if they will let us know what the problem is that led to her requesting the medication.

## 2020-02-29 ENCOUNTER — Other Ambulatory Visit: Payer: Self-pay

## 2020-02-29 ENCOUNTER — Emergency Department (HOSPITAL_COMMUNITY): Payer: Medicare Other

## 2020-02-29 ENCOUNTER — Encounter (HOSPITAL_COMMUNITY): Payer: Self-pay | Admitting: *Deleted

## 2020-02-29 ENCOUNTER — Emergency Department (HOSPITAL_COMMUNITY)
Admission: EM | Admit: 2020-02-29 | Discharge: 2020-02-29 | Disposition: A | Discharge status: Home / Self Care | Payer: Medicare Other | Attending: Emergency Medicine | Admitting: Emergency Medicine

## 2020-02-29 DIAGNOSIS — J449 Chronic obstructive pulmonary disease, unspecified: Secondary | ICD-10-CM | POA: Insufficient documentation

## 2020-02-29 DIAGNOSIS — J45909 Unspecified asthma, uncomplicated: Secondary | ICD-10-CM | POA: Diagnosis not present

## 2020-02-29 DIAGNOSIS — R079 Chest pain, unspecified: Secondary | ICD-10-CM | POA: Insufficient documentation

## 2020-02-29 DIAGNOSIS — Z79899 Other long term (current) drug therapy: Secondary | ICD-10-CM | POA: Diagnosis not present

## 2020-02-29 DIAGNOSIS — R0689 Other abnormalities of breathing: Secondary | ICD-10-CM | POA: Diagnosis not present

## 2020-02-29 DIAGNOSIS — N189 Chronic kidney disease, unspecified: Secondary | ICD-10-CM | POA: Insufficient documentation

## 2020-02-29 DIAGNOSIS — I509 Heart failure, unspecified: Secondary | ICD-10-CM | POA: Insufficient documentation

## 2020-02-29 DIAGNOSIS — R0789 Other chest pain: Secondary | ICD-10-CM | POA: Diagnosis not present

## 2020-02-29 DIAGNOSIS — Z7951 Long term (current) use of inhaled steroids: Secondary | ICD-10-CM | POA: Diagnosis not present

## 2020-02-29 DIAGNOSIS — I482 Chronic atrial fibrillation, unspecified: Secondary | ICD-10-CM | POA: Insufficient documentation

## 2020-02-29 DIAGNOSIS — I10 Essential (primary) hypertension: Secondary | ICD-10-CM | POA: Diagnosis not present

## 2020-02-29 DIAGNOSIS — I517 Cardiomegaly: Secondary | ICD-10-CM | POA: Diagnosis not present

## 2020-02-29 DIAGNOSIS — I130 Hypertensive heart and chronic kidney disease with heart failure and stage 1 through stage 4 chronic kidney disease, or unspecified chronic kidney disease: Secondary | ICD-10-CM | POA: Insufficient documentation

## 2020-02-29 DIAGNOSIS — J811 Chronic pulmonary edema: Secondary | ICD-10-CM | POA: Diagnosis not present

## 2020-02-29 LAB — BASIC METABOLIC PANEL
Anion gap: 10 (ref 5–15)
BUN: 16 mg/dL (ref 8–23)
CO2: 29 mmol/L (ref 22–32)
Calcium: 8.6 mg/dL — ABNORMAL LOW (ref 8.9–10.3)
Chloride: 98 mmol/L (ref 98–111)
Creatinine, Ser: 1.33 mg/dL — ABNORMAL HIGH (ref 0.44–1.00)
GFR calc Af Amer: 40 mL/min — ABNORMAL LOW (ref >60)
GFR calc non Af Amer: 35 mL/min — ABNORMAL LOW (ref >60)
Glucose, Bld: 114 mg/dL — ABNORMAL HIGH (ref 70–99)
Potassium: 3.7 mmol/L (ref 3.5–5.1)
Sodium: 137 mmol/L (ref 135–145)

## 2020-02-29 LAB — CBC
HCT: 29.4 % — ABNORMAL LOW (ref 36.0–46.0)
Hemoglobin: 9.2 g/dL — ABNORMAL LOW (ref 12.0–15.0)
MCH: 31.3 pg (ref 26.0–34.0)
MCHC: 31.3 g/dL (ref 30.0–36.0)
MCV: 100 fL (ref 80.0–100.0)
Platelets: 180 10*3/uL (ref 150–400)
RBC: 2.94 MIL/uL — ABNORMAL LOW (ref 3.87–5.11)
RDW: 15.4 % (ref 11.5–15.5)
WBC: 4.5 10*3/uL (ref 4.0–10.5)
nRBC: 0 % (ref 0.0–0.2)

## 2020-02-29 LAB — TROPONIN I (HIGH SENSITIVITY): Troponin I (High Sensitivity): 32 ng/L — ABNORMAL HIGH (ref <18)

## 2020-02-29 NOTE — ED Notes (Signed)
Pt in bed, family at bedside, family states that they are ready to go home, states that MD has updated them, pt denies pain, states that she is ready to go home.

## 2020-02-29 NOTE — ED Triage Notes (Signed)
Pt brought in by [facility] for c/o of an episode of chest pain that radiated to her back; pt denies any pain now; pt is a poor historian due to some dementia; cbg 136; pt was recently hospitalized for an ulcer that she had repaired

## 2020-03-01 ENCOUNTER — Ambulatory Visit (INDEPENDENT_AMBULATORY_CARE_PROVIDER_SITE_OTHER): Payer: Medicare Other

## 2020-03-01 DIAGNOSIS — D631 Anemia in chronic kidney disease: Secondary | ICD-10-CM | POA: Diagnosis not present

## 2020-03-01 DIAGNOSIS — Z8673 Personal history of transient ischemic attack (TIA), and cerebral infarction without residual deficits: Secondary | ICD-10-CM

## 2020-03-01 DIAGNOSIS — I5032 Chronic diastolic (congestive) heart failure: Secondary | ICD-10-CM | POA: Diagnosis not present

## 2020-03-01 DIAGNOSIS — N3000 Acute cystitis without hematuria: Secondary | ICD-10-CM | POA: Diagnosis not present

## 2020-03-01 DIAGNOSIS — M4802 Spinal stenosis, cervical region: Secondary | ICD-10-CM

## 2020-03-01 DIAGNOSIS — Z9181 History of falling: Secondary | ICD-10-CM

## 2020-03-01 DIAGNOSIS — F0150 Vascular dementia without behavioral disturbance: Secondary | ICD-10-CM

## 2020-03-01 DIAGNOSIS — G319 Degenerative disease of nervous system, unspecified: Secondary | ICD-10-CM

## 2020-03-01 DIAGNOSIS — Z950 Presence of cardiac pacemaker: Secondary | ICD-10-CM

## 2020-03-01 DIAGNOSIS — I480 Paroxysmal atrial fibrillation: Secondary | ICD-10-CM | POA: Diagnosis not present

## 2020-03-01 DIAGNOSIS — E039 Hypothyroidism, unspecified: Secondary | ICD-10-CM

## 2020-03-01 DIAGNOSIS — Z953 Presence of xenogenic heart valve: Secondary | ICD-10-CM

## 2020-03-01 DIAGNOSIS — M170 Bilateral primary osteoarthritis of knee: Secondary | ICD-10-CM | POA: Diagnosis not present

## 2020-03-01 DIAGNOSIS — M47812 Spondylosis without myelopathy or radiculopathy, cervical region: Secondary | ICD-10-CM

## 2020-03-01 DIAGNOSIS — J189 Pneumonia, unspecified organism: Secondary | ICD-10-CM | POA: Diagnosis not present

## 2020-03-01 DIAGNOSIS — N184 Chronic kidney disease, stage 4 (severe): Secondary | ICD-10-CM

## 2020-03-01 DIAGNOSIS — J441 Chronic obstructive pulmonary disease with (acute) exacerbation: Secondary | ICD-10-CM

## 2020-03-01 DIAGNOSIS — I130 Hypertensive heart and chronic kidney disease with heart failure and stage 1 through stage 4 chronic kidney disease, or unspecified chronic kidney disease: Secondary | ICD-10-CM

## 2020-03-01 DIAGNOSIS — I495 Sick sinus syndrome: Secondary | ICD-10-CM

## 2020-03-01 DIAGNOSIS — Z7952 Long term (current) use of systemic steroids: Secondary | ICD-10-CM

## 2020-03-01 DIAGNOSIS — J440 Chronic obstructive pulmonary disease with acute lower respiratory infection: Secondary | ICD-10-CM | POA: Diagnosis not present

## 2020-03-01 DIAGNOSIS — H409 Unspecified glaucoma: Secondary | ICD-10-CM

## 2020-03-01 DIAGNOSIS — K219 Gastro-esophageal reflux disease without esophagitis: Secondary | ICD-10-CM

## 2020-03-01 DIAGNOSIS — Z7951 Long term (current) use of inhaled steroids: Secondary | ICD-10-CM

## 2020-03-01 DIAGNOSIS — N179 Acute kidney failure, unspecified: Secondary | ICD-10-CM

## 2020-03-01 DIAGNOSIS — H9190 Unspecified hearing loss, unspecified ear: Secondary | ICD-10-CM

## 2020-03-01 DIAGNOSIS — I471 Supraventricular tachycardia: Secondary | ICD-10-CM

## 2020-03-01 NOTE — Telephone Encounter (Signed)
Patient aware and verbalized understanding. °

## 2020-03-02 DIAGNOSIS — I130 Hypertensive heart and chronic kidney disease with heart failure and stage 1 through stage 4 chronic kidney disease, or unspecified chronic kidney disease: Secondary | ICD-10-CM | POA: Diagnosis not present

## 2020-03-02 DIAGNOSIS — J189 Pneumonia, unspecified organism: Secondary | ICD-10-CM | POA: Diagnosis not present

## 2020-03-02 DIAGNOSIS — J440 Chronic obstructive pulmonary disease with acute lower respiratory infection: Secondary | ICD-10-CM | POA: Diagnosis not present

## 2020-03-02 DIAGNOSIS — J441 Chronic obstructive pulmonary disease with (acute) exacerbation: Secondary | ICD-10-CM | POA: Diagnosis not present

## 2020-03-02 DIAGNOSIS — I5032 Chronic diastolic (congestive) heart failure: Secondary | ICD-10-CM | POA: Diagnosis not present

## 2020-03-02 DIAGNOSIS — N3000 Acute cystitis without hematuria: Secondary | ICD-10-CM | POA: Diagnosis not present

## 2020-03-03 DIAGNOSIS — I130 Hypertensive heart and chronic kidney disease with heart failure and stage 1 through stage 4 chronic kidney disease, or unspecified chronic kidney disease: Secondary | ICD-10-CM | POA: Diagnosis not present

## 2020-03-03 DIAGNOSIS — N184 Chronic kidney disease, stage 4 (severe): Secondary | ICD-10-CM | POA: Diagnosis not present

## 2020-03-03 DIAGNOSIS — I5032 Chronic diastolic (congestive) heart failure: Secondary | ICD-10-CM | POA: Diagnosis not present

## 2020-03-03 DIAGNOSIS — J189 Pneumonia, unspecified organism: Secondary | ICD-10-CM | POA: Diagnosis not present

## 2020-03-03 DIAGNOSIS — J441 Chronic obstructive pulmonary disease with (acute) exacerbation: Secondary | ICD-10-CM | POA: Diagnosis not present

## 2020-03-03 DIAGNOSIS — N3000 Acute cystitis without hematuria: Secondary | ICD-10-CM | POA: Diagnosis not present

## 2020-03-03 DIAGNOSIS — J440 Chronic obstructive pulmonary disease with acute lower respiratory infection: Secondary | ICD-10-CM | POA: Diagnosis not present

## 2020-03-03 NOTE — ED Provider Notes (Signed)
[facility] EMERGENCY DEPARTMENT Provider Note   CSN: [id number] Arrival date & time: 02/29/20  1301     History Chief Complaint  Patient presents with  . Chest Pain    Crystal Cooper is a 86 y.o. female.  HPI   86 year old female presenting for evaluation of chest and back pain.  Patient is not a great historian but she did tell me that she had pain "from front to back" in her chest and back earlier today.  She cannot provide me [place] specifics otherwise.  This feeling has since gone away.  She mentioned this to her caretaker who became concerned because she does not complain frequently. She has otherwise seemed to be in her usual state of health otherwise over the past few days. Daughter is at bedside. She was not present when these symptoms occurred. She says her mother seems to be at her baseline currently.   Past Medical History:  Diagnosis Date  . AKI (acute kidney injury) ([place])   . Anemia    years ago  . Aortic stenosis   . Arthritis   . Asthma   . Atrial fibrillation ([place])   . CHF (congestive heart failure) ([place]) 11/2014  . CKD (chronic kidney disease)   . Dementia ([place])   . Family history of adverse reaction to anesthesia    2 daughters would have N/V  . Gastric AVM   . GI bleed   . Glaucoma   . Hearing loss   . Heart murmur   . HTN (hypertension)   . Hypokalemia   . Pneumonia   . Prolapsing mitral leaflet syndrome   . Shortness of breath dyspnea    with exertion  . Stroke ([place])   . SVT (supraventricular tachycardia) ([facility])    S/P ablation of AVNRT in 2003    Patient Active Problem List   Diagnosis Date Noted  . Upper GI bleed 02/19/2020  . Acute blood loss anemia 02/19/2020  . CKD (chronic kidney disease) stage 3, GFR 30-59 ml/min 02/19/2020  . Hematochezia 02/19/2020  . Iron deficiency anemia due to chronic blood loss 02/18/2020  . AVM (arteriovenous malformation) 02/18/2020  . COPD (chronic obstructive pulmonary disease) ([place]) 02/18/2020  . Dementia  without behavioral disturbance ([place]) 02/18/2020  . SOB (shortness of breath)   . COPD with acute exacerbation ([place])   . CAP (community acquired pneumonia) 02/12/2020  . Bilateral impacted cerumen 11/27/2019  . Long term (current) use of anticoagulants   . Acute renal failure superimposed on stage 3b chronic kidney disease ([place])   . Wheezing 09/23/2019  . Abnormal breath sounds 09/09/2019  . Heme positive stool   . Acute CVA (cerebrovascular accident) ([place]) 08/27/2019  . PNA (pneumonia) 08/25/2019  . Hypokalemia 08/23/2019  . Angio-edema, initial encounter 08/23/2019  . Reactive airway disease 03/24/2019  . Gastroesophageal reflux disease 03/24/2019  . CKD (chronic kidney disease), stage III 11/12/2018  . GI bleed 05/12/2018  . 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 Diastolic dysfunction 20/25/4270  . Pacemaker 05/12/2018  . Iron deficiency anemia   . Melena   . Duodenal arteriovenous malformation   . Gastritis and gastroduodenitis   . Normocytic anemia 04/06/2018  . Osteoporosis 08/27/2016  . ASCVD (arteriosclerotic cardiovascular disease) 01/11/2016  . Asthma 01/11/2016  . Glaucoma 01/11/2016  . Hearing loss 01/11/2016  . Other nonrheumatic mitral valve disorders 01/11/2016  . Junctional bradycardia   . PAF (paroxysmal atrial fibrillation) ([place]) 09/06/2015  . Hyponatremia 12/17/2014  . Congestive  heart failure ([place]) 12/12/2014  . Murmur 10/19/2011  . Chest pain 10/19/2011  . Essential hypertension 10/19/2011  . History of cardiac radiofrequency ablation (RFA) 10/19/2011    Past Surgical History:  Procedure Laterality Date  . APPENDECTOMY    . BIOPSY  04/07/2018   Procedure: BIOPSY;  Surgeon: Jerene Bears, MD;  Location: [facility] ENDOSCOPY;  Service: Gastroenterology;;  . BIOPSY  02/19/2020   Procedure: BIOPSY;  Surgeon: Harvel Quale, MD;  Location: [facility] ENDO SUITE;  Service: Gastroenterology;;  gastric   . BLADDER SURGERY    . CARDIAC  CATHETERIZATION N/A 09/26/2015   Procedure: Right/Left Heart Cath and Coronary Angiography;  Surgeon: Burnell Blanks, MD;  Location: [place] CV LAB;  Service: Cardiovascular;  Laterality: N/A;  . CARDIAC SURGERY    . CARDIOVERSION N/A 03/02/2016   Procedure: CARDIOVERSION;  Surgeon: Thayer Headings, MD;  Location: [facility] ENDOSCOPY;  Service: Cardiovascular;  Laterality: N/A;  . ENTEROSCOPY  02/19/2020   Procedure: ENTEROSCOPY;  Surgeon: Harvel Quale, MD;  Location: [facility] ENDO SUITE;  Service: Gastroenterology;;  . EP IMPLANTABLE DEVICE N/A 10/26/2015   Procedure: Pacemaker Implant;  Surgeon: Will Meredith Leeds, MD;  Location: [place] CV LAB;  Service: Cardiovascular;  Laterality: N/A;  . ESOPHAGOGASTRODUODENOSCOPY (EGD) WITH PROPOFOL N/A 04/07/2018   Procedure: ESOPHAGOGASTRODUODENOSCOPY (EGD) WITH PROPOFOL;  Surgeon: Jerene Bears, MD;  Location: [facility] ENDOSCOPY;  Service: Gastroenterology;  Laterality: N/A;  . ESOPHAGOGASTRODUODENOSCOPY (EGD) WITH PROPOFOL N/A 10/12/2019   Procedure: ESOPHAGOGASTRODUODENOSCOPY (EGD) WITH PROPOFOL;  Surgeon: Danie Binder, MD;  Location[facility] ENDO [facility];  Service: Endoscopy;  Laterality: N/A;  . EYE SURGERY Bilateral    cataract surgery  . FLEXIBLE SIGMOIDOSCOPY  02/19/2020   Procedure: FLEXIBLE SIGMOIDOSCOPY;  Surgeon: Montez Morita, Quillian Quince, MD;  Location: [facility] END[facility];  Service: Gastroenterology;;  . HOT HEMOSTASIS N/A 04/07/2018   Procedure: HOT HEMOSTASIS (ARGON PLASMA COAGULATION/BICAP);  Surgeon: Jerene Bears, MD;  Location[facility] ENDOSCOPY;  Service: Gastroenterology;  Laterality: N/A;  . HOT HEMOSTASIS  10/12/2019   Procedure: HOT HEMOSTASIS (ARGON PLASMA COAGULATION/BICAP);  Surgeon: Danie Binder, MD;  Location: [facility] ENDO [facility];  Service: Endoscopy;;  . NASAL SINUS SURGERY    . PACEMAKER INSERTION    . TEE WITHOUT CARDIOVERSION N/A 10/25/2015   Procedure: TRANSESOPHAGEAL ECHOCARDIOGRAM (TEE);  Surgeon: Burnell Blanks, MD;   Location: [place];  Service: Open Heart Surgery;  Laterality: N/A;  . TRANSCATHETER AORTIC VALVE REPLACEMENT, TRANSFEMORAL Right 10/25/2015   Procedure: TRANSCATHETER AORTIC VALVE REPLACEMENT, TRANSFEMORAL;  Surgeon: Burnell Blanks, MD;  Location: [place];  Service: Open Heart Surgery;  Laterality: Right;     OB History    Gravida  3   Para  3   Term  3   Preterm      AB      Living  2     SAB      TAB      Ectopic      Multiple      Live Births              Family History  Problem Relation Age of Onset  . Hypertension Mother   . Pneumonia Father   . Stomach cancer Brother   . Stroke Brother   . AAA (abdominal aortic aneurysm) Brother   . Cervical cancer Daughter   . Heart attack Neg Hx   . Colon cancer Neg Hx   . Esophageal cancer Neg Hx     Social  History   Tobacco Use  . Smoking status: Never Smoker  . Smokeless tobacco: Never Used  Vaping Use  . Vaping Use: Never used  Substance Use Topics  . Alcohol use: No    Alcohol/week: 0.0 standard drinks  . Drug use: No    Home Medications Prior to Admission medications   Medication Sig Start Date End Date Taking? Authorizing Provider  acetaminophen (TYLENOL) 500 MG tablet Take 500 mg by mouth every 6 (six) hours as needed for mild pain or headache.     [provider]  albuterol (PROAIR HFA) 108 (90 Base) MCG/ACT inhaler Inhale 2 puffs into the lungs every 4 (four) hours as needed for wheezing or shortness of breath. 08/05/19   Baruch Gouty, FNP  Biotin 10 MG CAPS Take 10 mg by mouth daily.     [provider]  Budeson-Glycopyrrol-Formoterol (BREZTRI AEROSPHERE) 160-9-4.8 MCG/ACT AERO Inhale 2 Inhalers into the lungs in the morning and at bedtime. 11/27/19   Ivy Lynn, NP  Calcium Carb-Cholecalciferol (CALTRATE 600+D3) 600-800 MG-UNIT TABS Take 1 tablet by mouth daily.    [provider]  famotidine (PEPCID) 20 MG tablet Take 1 tablet (20 mg total) by mouth at bedtime.  12/21/19 12/20/20  [name], MD  ferrous sulfate 325 (65 FE) MG tablet Take 1 tablet (325 mg total) by mouth 2 (two) times daily with a meal. 04/08/18   Eulogio Bear U, DO  furosemide (LASIX) 20 MG tablet Take 4 tablets (80 mg total) by mouth daily. 02/22/20   Lelon Perla, MD  levothyroxine (SYNTHROID) 25 MCG tablet Take 1 tablet (25 mcg total) by mouth daily. 12/02/19   [name], MD  memantine (NAMENDA) 10 MG tablet Take 1 tablet (10 mg total) by mouth 2 (two) times daily. 01/04/20   Garvin Fila, MD  metoprolol tartrate (LOPRESSOR) 25 MG tablet Take 1 tablet (25 mg total) by mouth 2 (two) times daily. 05/20/19   Camnitz, Ocie Doyne, MD  multivitamin-iron-minerals-folic acid (CENTRUM) chewable tablet Chew 1 tablet by mouth daily.    [provider]  pantoprazole (PROTONIX) 40 MG tablet Take 1 tablet (40 mg total) by mouth 2 (two) times daily. 02/21/20   Orson Eva, MD  potassium chloride SA (KLOR-CON) 10 MEQ tablet Take 2 tablets (20 mEq total) by mouth 2 (two) times daily. 02/09/20   [name], MD    Allergies    Hctz [hydrochlorothiazide], Aspirin, and Codeine  Review of Systems   Review of Systems Level 5 caveat because of dementia.  Physical Exam Updated Vital Signs BP (!) 145/70 (BP Location: Right Arm)   Pulse 60   Temp 98.2 F (36.8 C) (Oral)   Resp 19   Ht 5\' 2"  (1.575 m)   Wt 51.7 kg   SpO2 97%   BMI 20.85 kg/m   Physical Exam Vitals and nursing note reviewed.  Constitutional:      General: She is not in acute distress.    Appearance: She is well-developed.  HENT:     Head: Normocephalic and atraumatic.  Eyes:     General:        Right eye: No discharge.        Left eye: No discharge.     Conjunctiva/sclera: Conjunctivae normal.  Cardiovascular:     Rate and Rhythm: Normal rate and regular rhythm.     Heart sounds: Normal heart sounds. No murmur heard.  No friction rub. No gallop.      Comments:  Pacer L chest.  Pulmonary:      Effort: Pulmonary effort is normal. No respiratory distress.     Breath sounds: Normal breath sounds.  Abdominal:     General: There is no distension.     Palpations: Abdomen is soft.     Tenderness: There is no abdominal tenderness.  Musculoskeletal:        General: No tenderness.     Cervical back: Neck supple.  Skin:    General: Skin is warm and dry.  Neurological:     Mental Status: She is alert.     Comments: Disoriented to time. No motor deficits.   Psychiatric:        Thought Content: Thought content normal.     ED Results / Procedures / Treatments   Labs (all labs ordered are listed, but only abnormal results are displayed) Labs Reviewed  BASIC METABOLIC PANEL - Abnormal; Notable for the following components:      Result Value   Glucose, Bld 114 (*)    Creatinine, Ser 1.33 (*)    Calcium 8.6 (*)    GFR calc non Af Amer 35 (*)    GFR calc Af Amer 40 (*)    All other components within normal limits  CBC - Abnormal; Notable for the following components:   RBC 2.94 (*)    Hemoglobin 9.2 (*)    HCT 29.4 (*)    All other components within normal limits  TROPONIN I (HIGH SENSITIVITY) - Abnormal; Notable for the following components:   Troponin I (High Sensitivity) 32 (*)    All other components within normal limits    EKG EKG Interpretation  Date/Time:  Monday February 29 2020 13:12:06 EDT Ventricular Rate:  71 PR Interval:    QRS Duration: 163 QT Interval:  481 QTC Calculation: 523 R Axis:   -81 Text Interpretation: Ventricular-paced rhythm Confirmed by Virgel Manifold [phone]) on 02/29/2020 1:43:20 PM   Radiology No results found.   DG Chest 2 View  Result Date: 02/29/2020 CLINICAL DATA:  Chest pain EXAM: CHEST - 2 VIEW COMPARISON:  Chest x-ray dated 02/12/2020. FINDINGS: Mild cardiomegaly is stable. Lungs are hyperexpanded. Coarse lung markings bilaterally suggests chronic interstitial lung disease/fibrosis. No new lung findings. No confluent opacity to  suggest a consolidating pneumonia. No pleural effusion or pneumothorax is seen. LEFT chest wall pacemaker/ICD hardware is stable. Osteopenia limits characterization of osseous detail. Cannot exclude acute compression fracture deformity within the midthoracic spine. IMPRESSION: 1. Osteopenia limits characterization of the thoracic spine. It is difficult to exclude compression fracture within the midthoracic spine. If any recent injury or midline back pain, consider dedicated plain film of the thoracic spine for further characterization. 2. No active cardiopulmonary disease. No evidence of pneumonia or pulmonary edema. 3. Stable mild cardiomegaly. 4. Hyperexpanded lungs indicating COPD. Probable chronic interstitial lung disease/fibrosis. Electronically Signed   By: Franki Cabot M.D.   On: 02/29/2020 13:43   DG Chest Port 1 View  Result Date: 02/12/2020 CLINICAL DATA:  Shortness of breath. EXAM: PORTABLE CHEST 1 VIEW COMPARISON:  12/13/2019.  10/19/2019. FINDINGS: Cardiac pacer stable position. Prior cardiac valve replacement. Stable cardiomegaly. No pulmonary venous congestion. Bilateral upper lobe pulmonary infiltrates noted on today's exam. No pleural effusion or pneumothorax. IMPRESSION: 1. Cardiac pacer stable position. Prior cardiac valve replacement. Stable cardiomegaly. No pulmonary venous congestion. 2. Bilateral upper lobe pulmonary infiltrates noted on today's exam. Electronically Signed   By: [place]   On: 02/12/2020 07:21   CUP  PACEART REMOTE DEVICE CHECK  Result Date: 02/10/2020 Scheduled remote reviewed. Normal device function.  Known persistent AF, not OAC candidate. Next remote 91 days. JM    Procedures Procedures (including critical care time)  Medications Ordered in ED Medications - No data to display  ED Course  I have reviewed the triage vital signs and the nursing notes.  Pertinent labs & imaging results that were available during my care of the patient were  reviewed by me and considered in my medical decision making (see chart for details).    MDM Rules/Calculators/A&P                          89yF with back and chest pain now resolved. Hard to get a great sense of her symptoms. W/u fairly unremarkable. Troponin is mildly elevated but consistent with multiple other values over the past 6 months. I do not have a high suspicion for ACS. Afebrile. Appears well. At baseline per daughter. BP reasonable. Cardiac silhouette stable. Doubt dissection.    Final Clinical Impression(s) / ED Diagnoses Final diagnoses:  Chest pain, unspecified type    Rx / DC Orders ED Discharge Orders    None       Virgel Manifold, MD 03/03/20 1537

## 2020-03-04 ENCOUNTER — Other Ambulatory Visit: Payer: Self-pay | Admitting: Family Medicine

## 2020-03-04 DIAGNOSIS — I5032 Chronic diastolic (congestive) heart failure: Secondary | ICD-10-CM | POA: Diagnosis not present

## 2020-03-04 DIAGNOSIS — N3000 Acute cystitis without hematuria: Secondary | ICD-10-CM | POA: Diagnosis not present

## 2020-03-04 DIAGNOSIS — J441 Chronic obstructive pulmonary disease with (acute) exacerbation: Secondary | ICD-10-CM | POA: Diagnosis not present

## 2020-03-04 DIAGNOSIS — J440 Chronic obstructive pulmonary disease with acute lower respiratory infection: Secondary | ICD-10-CM | POA: Diagnosis not present

## 2020-03-04 DIAGNOSIS — I130 Hypertensive heart and chronic kidney disease with heart failure and stage 1 through stage 4 chronic kidney disease, or unspecified chronic kidney disease: Secondary | ICD-10-CM | POA: Diagnosis not present

## 2020-03-04 DIAGNOSIS — J189 Pneumonia, unspecified organism: Secondary | ICD-10-CM | POA: Diagnosis not present

## 2020-03-07 ENCOUNTER — Other Ambulatory Visit: Payer: Self-pay | Admitting: Family Medicine

## 2020-03-09 ENCOUNTER — Encounter (HOSPITAL_COMMUNITY): Payer: Self-pay

## 2020-03-09 ENCOUNTER — Emergency Department (HOSPITAL_COMMUNITY): Payer: Medicare Other

## 2020-03-09 ENCOUNTER — Inpatient Hospital Stay (HOSPITAL_COMMUNITY)
Admission: EM | Admit: 2020-03-09 | Discharge: 2020-03-15 | DRG: 481 | Disposition: A | Discharge status: Skilled Nursing Facility | Payer: Medicare Other | Attending: Internal Medicine | Admitting: Internal Medicine

## 2020-03-09 ENCOUNTER — Other Ambulatory Visit: Payer: Self-pay

## 2020-03-09 DIAGNOSIS — Z800 Family history of malignant neoplasm of digestive organs: Secondary | ICD-10-CM

## 2020-03-09 DIAGNOSIS — J441 Chronic obstructive pulmonary disease with (acute) exacerbation: Secondary | ICD-10-CM | POA: Diagnosis not present

## 2020-03-09 DIAGNOSIS — Y92019 Unspecified place in single-family (private) house as the place of occurrence of the external cause: Secondary | ICD-10-CM

## 2020-03-09 DIAGNOSIS — S72142A Displaced intertrochanteric fracture of left femur, initial encounter for closed fracture: Principal | ICD-10-CM | POA: Diagnosis present

## 2020-03-09 DIAGNOSIS — Z20822 Contact with and (suspected) exposure to covid-19: Secondary | ICD-10-CM | POA: Diagnosis present

## 2020-03-09 DIAGNOSIS — S72002A Fracture of unspecified part of neck of left femur, initial encounter for closed fracture: Secondary | ICD-10-CM | POA: Diagnosis not present

## 2020-03-09 DIAGNOSIS — I252 Old myocardial infarction: Secondary | ICD-10-CM | POA: Diagnosis not present

## 2020-03-09 DIAGNOSIS — M6281 Muscle weakness (generalized): Secondary | ICD-10-CM | POA: Diagnosis not present

## 2020-03-09 DIAGNOSIS — Z79899 Other long term (current) drug therapy: Secondary | ICD-10-CM

## 2020-03-09 DIAGNOSIS — F0280 Dementia in other diseases classified elsewhere without behavioral disturbance: Secondary | ICD-10-CM | POA: Diagnosis not present

## 2020-03-09 DIAGNOSIS — Z823 Family history of stroke: Secondary | ICD-10-CM | POA: Diagnosis not present

## 2020-03-09 DIAGNOSIS — D62 Acute posthemorrhagic anemia: Secondary | ICD-10-CM | POA: Diagnosis not present

## 2020-03-09 DIAGNOSIS — Z886 Allergy status to analgesic agent status: Secondary | ICD-10-CM

## 2020-03-09 DIAGNOSIS — Z9889 Other specified postprocedural states: Secondary | ICD-10-CM

## 2020-03-09 DIAGNOSIS — E876 Hypokalemia: Secondary | ICD-10-CM | POA: Diagnosis not present

## 2020-03-09 DIAGNOSIS — F0150 Vascular dementia without behavioral disturbance: Secondary | ICD-10-CM | POA: Diagnosis not present

## 2020-03-09 DIAGNOSIS — I5189 Other ill-defined heart diseases: Secondary | ICD-10-CM

## 2020-03-09 DIAGNOSIS — N3000 Acute cystitis without hematuria: Secondary | ICD-10-CM | POA: Diagnosis not present

## 2020-03-09 DIAGNOSIS — G309 Alzheimer's disease, unspecified: Secondary | ICD-10-CM | POA: Diagnosis present

## 2020-03-09 DIAGNOSIS — E039 Hypothyroidism, unspecified: Secondary | ICD-10-CM | POA: Diagnosis present

## 2020-03-09 DIAGNOSIS — H9190 Unspecified hearing loss, unspecified ear: Secondary | ICD-10-CM | POA: Diagnosis present

## 2020-03-09 DIAGNOSIS — I130 Hypertensive heart and chronic kidney disease with heart failure and stage 1 through stage 4 chronic kidney disease, or unspecified chronic kidney disease: Secondary | ICD-10-CM | POA: Diagnosis present

## 2020-03-09 DIAGNOSIS — N1832 Chronic kidney disease, stage 3b: Secondary | ICD-10-CM | POA: Diagnosis not present

## 2020-03-09 DIAGNOSIS — J189 Pneumonia, unspecified organism: Secondary | ICD-10-CM | POA: Diagnosis not present

## 2020-03-09 DIAGNOSIS — N183 Chronic kidney disease, stage 3 unspecified: Secondary | ICD-10-CM | POA: Diagnosis present

## 2020-03-09 DIAGNOSIS — S0083XA Contusion of other part of head, initial encounter: Secondary | ICD-10-CM | POA: Diagnosis present

## 2020-03-09 DIAGNOSIS — M25552 Pain in left hip: Secondary | ICD-10-CM | POA: Diagnosis not present

## 2020-03-09 DIAGNOSIS — Z8673 Personal history of transient ischemic attack (TIA), and cerebral infarction without residual deficits: Secondary | ICD-10-CM

## 2020-03-09 DIAGNOSIS — J449 Chronic obstructive pulmonary disease, unspecified: Secondary | ICD-10-CM | POA: Diagnosis not present

## 2020-03-09 DIAGNOSIS — M1990 Unspecified osteoarthritis, unspecified site: Secondary | ICD-10-CM | POA: Diagnosis present

## 2020-03-09 DIAGNOSIS — Z950 Presence of cardiac pacemaker: Secondary | ICD-10-CM | POA: Diagnosis not present

## 2020-03-09 DIAGNOSIS — M79662 Pain in left lower leg: Secondary | ICD-10-CM | POA: Diagnosis not present

## 2020-03-09 DIAGNOSIS — Z888 Allergy status to other drugs, medicaments and biological substances status: Secondary | ICD-10-CM

## 2020-03-09 DIAGNOSIS — M79605 Pain in left leg: Secondary | ICD-10-CM | POA: Diagnosis not present

## 2020-03-09 DIAGNOSIS — I5032 Chronic diastolic (congestive) heart failure: Secondary | ICD-10-CM | POA: Diagnosis not present

## 2020-03-09 DIAGNOSIS — W010XXA Fall on same level from slipping, tripping and stumbling without subsequent striking against object, initial encounter: Secondary | ICD-10-CM | POA: Diagnosis present

## 2020-03-09 DIAGNOSIS — I2510 Atherosclerotic heart disease of native coronary artery without angina pectoris: Secondary | ICD-10-CM | POA: Diagnosis present

## 2020-03-09 DIAGNOSIS — F0390 Unspecified dementia without behavioral disturbance: Secondary | ICD-10-CM | POA: Diagnosis not present

## 2020-03-09 DIAGNOSIS — I10 Essential (primary) hypertension: Secondary | ICD-10-CM | POA: Diagnosis not present

## 2020-03-09 DIAGNOSIS — Z043 Encounter for examination and observation following other accident: Secondary | ICD-10-CM | POA: Diagnosis not present

## 2020-03-09 DIAGNOSIS — R011 Cardiac murmur, unspecified: Secondary | ICD-10-CM | POA: Diagnosis present

## 2020-03-09 DIAGNOSIS — I480 Paroxysmal atrial fibrillation: Secondary | ICD-10-CM | POA: Diagnosis not present

## 2020-03-09 DIAGNOSIS — R279 Unspecified lack of coordination: Secondary | ICD-10-CM | POA: Diagnosis not present

## 2020-03-09 DIAGNOSIS — D631 Anemia in chronic kidney disease: Secondary | ICD-10-CM | POA: Diagnosis not present

## 2020-03-09 DIAGNOSIS — S72142D Displaced intertrochanteric fracture of left femur, subsequent encounter for closed fracture with routine healing: Secondary | ICD-10-CM | POA: Diagnosis not present

## 2020-03-09 DIAGNOSIS — Q2730 Arteriovenous malformation, site unspecified: Secondary | ICD-10-CM

## 2020-03-09 DIAGNOSIS — R41841 Cognitive communication deficit: Secondary | ICD-10-CM | POA: Diagnosis not present

## 2020-03-09 DIAGNOSIS — I509 Heart failure, unspecified: Secondary | ICD-10-CM

## 2020-03-09 DIAGNOSIS — K922 Gastrointestinal hemorrhage, unspecified: Secondary | ICD-10-CM | POA: Diagnosis present

## 2020-03-09 DIAGNOSIS — Z7401 Bed confinement status: Secondary | ICD-10-CM | POA: Diagnosis not present

## 2020-03-09 DIAGNOSIS — Z9181 History of falling: Secondary | ICD-10-CM | POA: Diagnosis not present

## 2020-03-09 DIAGNOSIS — J439 Emphysema, unspecified: Secondary | ICD-10-CM

## 2020-03-09 DIAGNOSIS — K219 Gastro-esophageal reflux disease without esophagitis: Secondary | ICD-10-CM | POA: Diagnosis present

## 2020-03-09 DIAGNOSIS — Z952 Presence of prosthetic heart valve: Secondary | ICD-10-CM | POA: Diagnosis not present

## 2020-03-09 DIAGNOSIS — H409 Unspecified glaucoma: Secondary | ICD-10-CM | POA: Diagnosis present

## 2020-03-09 DIAGNOSIS — W19XXXA Unspecified fall, initial encounter: Secondary | ICD-10-CM

## 2020-03-09 DIAGNOSIS — M25562 Pain in left knee: Secondary | ICD-10-CM | POA: Diagnosis not present

## 2020-03-09 DIAGNOSIS — Z8049 Family history of malignant neoplasm of other genital organs: Secondary | ICD-10-CM

## 2020-03-09 DIAGNOSIS — J440 Chronic obstructive pulmonary disease with acute lower respiratory infection: Secondary | ICD-10-CM | POA: Diagnosis not present

## 2020-03-09 DIAGNOSIS — Z7951 Long term (current) use of inhaled steroids: Secondary | ICD-10-CM

## 2020-03-09 DIAGNOSIS — S199XXA Unspecified injury of neck, initial encounter: Secondary | ICD-10-CM | POA: Diagnosis not present

## 2020-03-09 DIAGNOSIS — Z8719 Personal history of other diseases of the digestive system: Secondary | ICD-10-CM | POA: Diagnosis not present

## 2020-03-09 DIAGNOSIS — Z79890 Hormone replacement therapy (postmenopausal): Secondary | ICD-10-CM

## 2020-03-09 DIAGNOSIS — M810 Age-related osteoporosis without current pathological fracture: Secondary | ICD-10-CM | POA: Diagnosis present

## 2020-03-09 DIAGNOSIS — R52 Pain, unspecified: Secondary | ICD-10-CM | POA: Diagnosis not present

## 2020-03-09 DIAGNOSIS — I5030 Unspecified diastolic (congestive) heart failure: Secondary | ICD-10-CM | POA: Diagnosis not present

## 2020-03-09 DIAGNOSIS — S72002D Fracture of unspecified part of neck of left femur, subsequent encounter for closed fracture with routine healing: Secondary | ICD-10-CM | POA: Diagnosis not present

## 2020-03-09 DIAGNOSIS — Z885 Allergy status to narcotic agent status: Secondary | ICD-10-CM

## 2020-03-09 DIAGNOSIS — R2689 Other abnormalities of gait and mobility: Secondary | ICD-10-CM | POA: Diagnosis not present

## 2020-03-09 DIAGNOSIS — K31819 Angiodysplasia of stomach and duodenum without bleeding: Secondary | ICD-10-CM | POA: Diagnosis present

## 2020-03-09 DIAGNOSIS — Z8249 Family history of ischemic heart disease and other diseases of the circulatory system: Secondary | ICD-10-CM

## 2020-03-09 DIAGNOSIS — R0689 Other abnormalities of breathing: Secondary | ICD-10-CM | POA: Diagnosis not present

## 2020-03-09 DIAGNOSIS — Z419 Encounter for procedure for purposes other than remedying health state, unspecified: Secondary | ICD-10-CM

## 2020-03-09 DIAGNOSIS — I110 Hypertensive heart disease with heart failure: Secondary | ICD-10-CM | POA: Diagnosis not present

## 2020-03-09 HISTORY — DX: Gastro-esophageal reflux disease without esophagitis: K21.9

## 2020-03-09 HISTORY — DX: Fracture of unspecified part of neck of left femur, initial encounter for closed fracture: S72.002A

## 2020-03-09 LAB — TYPE AND SCREEN
ABO/RH(D): A POS
Antibody Screen: NEGATIVE

## 2020-03-09 LAB — BASIC METABOLIC PANEL
Anion gap: 12 (ref 5–15)
BUN: 22 mg/dL (ref 8–23)
CO2: 30 mmol/L (ref 22–32)
Calcium: 9 mg/dL (ref 8.9–10.3)
Chloride: 93 mmol/L — ABNORMAL LOW (ref 98–111)
Creatinine, Ser: 1.29 mg/dL — ABNORMAL HIGH (ref 0.44–1.00)
GFR calc Af Amer: 42 mL/min — ABNORMAL LOW (ref >60)
GFR calc non Af Amer: 36 mL/min — ABNORMAL LOW (ref >60)
Glucose, Bld: 137 mg/dL — ABNORMAL HIGH (ref 70–99)
Potassium: 3.4 mmol/L — ABNORMAL LOW (ref 3.5–5.1)
Sodium: 135 mmol/L (ref 135–145)

## 2020-03-09 LAB — CBC WITH DIFFERENTIAL/PLATELET
Abs Immature Granulocytes: 0.02 10*3/uL (ref 0.00–0.07)
Basophils Absolute: 0.1 10*3/uL (ref 0.0–0.1)
Basophils Relative: 1 %
Eosinophils Absolute: 0 10*3/uL (ref 0.0–0.5)
Eosinophils Relative: 1 %
HCT: 34.8 % — ABNORMAL LOW (ref 36.0–46.0)
Hemoglobin: 10.7 g/dL — ABNORMAL LOW (ref 12.0–15.0)
Immature Granulocytes: 0 %
Lymphocytes Relative: 9 %
Lymphs Abs: 0.5 10*3/uL — ABNORMAL LOW (ref 0.7–4.0)
MCH: 30.9 pg (ref 26.0–34.0)
MCHC: 30.7 g/dL (ref 30.0–36.0)
MCV: 100.6 fL — ABNORMAL HIGH (ref 80.0–100.0)
Monocytes Absolute: 0.5 10*3/uL (ref 0.1–1.0)
Monocytes Relative: 8 %
Neutro Abs: 4.9 10*3/uL (ref 1.7–7.7)
Neutrophils Relative %: 81 %
Platelets: 200 10*3/uL (ref 150–400)
RBC: 3.46 MIL/uL — ABNORMAL LOW (ref 3.87–5.11)
RDW: 14.7 % (ref 11.5–15.5)
WBC: 6 10*3/uL (ref 4.0–10.5)
nRBC: 0 % (ref 0.0–0.2)

## 2020-03-09 LAB — PROTIME-INR
INR: 1.1 (ref 0.8–1.2)
Prothrombin Time: 13.8 seconds (ref 11.4–15.2)

## 2020-03-09 LAB — SARS CORONAVIRUS 2 BY RT PCR (HOSPITAL ORDER, PERFORMED IN ~~LOC~~ HOSPITAL LAB): SARS Coronavirus 2: NEGATIVE

## 2020-03-09 MED ORDER — ADULT MULTIVITAMIN W/MINERALS CH
1.0000 | ORAL_TABLET | Freq: Every day | ORAL | Status: DC
  Administered 2020-03-11 – 2020-03-15 (×5): 1 via ORAL
  Filled 2020-03-09 (×4): qty 1

## 2020-03-09 MED ORDER — BUDESONIDE 0.25 MG/2ML IN SUSP
0.2500 mg | Freq: Two times a day (BID) | RESPIRATORY_TRACT | Status: DC
  Administered 2020-03-10 – 2020-03-15 (×11): 0.25 mg via RESPIRATORY_TRACT
  Filled 2020-03-09 (×12): qty 2

## 2020-03-09 MED ORDER — MORPHINE SULFATE (PF) 2 MG/ML IV SOLN: 0.5000 mg | INTRAVENOUS | Status: DC | PRN

## 2020-03-09 MED ORDER — FUROSEMIDE 40 MG PO TABS: 80.0000 mg | ORAL_TABLET | Freq: Every day | ORAL | Status: DC

## 2020-03-09 MED ORDER — BIOTIN 10 MG PO CAPS: 10.0000 mg | ORAL_CAPSULE | Freq: Every day | ORAL | Status: DC

## 2020-03-09 MED ORDER — FERROUS SULFATE 325 (65 FE) MG PO TABS
325.0000 mg | ORAL_TABLET | Freq: Two times a day (BID) | ORAL | Status: DC
  Administered 2020-03-10 – 2020-03-11 (×2): 325 mg via ORAL
  Filled 2020-03-09 (×4): qty 1

## 2020-03-09 MED ORDER — DOCUSATE SODIUM 100 MG PO CAPS
100.0000 mg | ORAL_CAPSULE | Freq: Two times a day (BID) | ORAL | Status: DC
  Administered 2020-03-09: 100 mg via ORAL
  Filled 2020-03-09: qty 1

## 2020-03-09 MED ORDER — POTASSIUM CHLORIDE CRYS ER 20 MEQ PO TBCR
20.0000 meq | EXTENDED_RELEASE_TABLET | Freq: Two times a day (BID) | ORAL | Status: DC
  Administered 2020-03-09 – 2020-03-15 (×11): 20 meq via ORAL
  Filled 2020-03-09 (×11): qty 1

## 2020-03-09 MED ORDER — CHOLECALCIFEROL 10 MCG (400 UNIT) PO TABS
800.0000 [IU] | ORAL_TABLET | Freq: Every day | ORAL | Status: DC
  Administered 2020-03-11 – 2020-03-15 (×5): 800 [IU] via ORAL
  Filled 2020-03-09 (×7): qty 2

## 2020-03-09 MED ORDER — MEMANTINE HCL 10 MG PO TABS
10.0000 mg | ORAL_TABLET | Freq: Two times a day (BID) | ORAL | Status: DC
  Administered 2020-03-09 – 2020-03-15 (×11): 10 mg via ORAL
  Filled 2020-03-09 (×11): qty 1

## 2020-03-09 MED ORDER — SODIUM CHLORIDE 0.9 % IV SOLN: INTRAVENOUS | Status: DC

## 2020-03-09 MED ORDER — PANTOPRAZOLE SODIUM 40 MG PO TBEC
40.0000 mg | DELAYED_RELEASE_TABLET | Freq: Two times a day (BID) | ORAL | Status: DC
  Administered 2020-03-09 – 2020-03-15 (×11): 40 mg via ORAL
  Filled 2020-03-09 (×11): qty 1

## 2020-03-09 MED ORDER — CALCIUM CARB-CHOLECALCIFEROL 600-800 MG-UNIT PO TABS: 1.0000 | ORAL_TABLET | Freq: Every day | ORAL | Status: DC

## 2020-03-09 MED ORDER — METHOCARBAMOL 500 MG PO TABS
500.0000 mg | ORAL_TABLET | Freq: Four times a day (QID) | ORAL | Status: DC | PRN
  Administered 2020-03-11: 500 mg via ORAL

## 2020-03-09 MED ORDER — ALBUTEROL SULFATE HFA 108 (90 BASE) MCG/ACT IN AERS: 2.0000 | INHALATION_SPRAY | RESPIRATORY_TRACT | Status: DC | PRN

## 2020-03-09 MED ORDER — LEVOTHYROXINE SODIUM 25 MCG PO TABS
25.0000 ug | ORAL_TABLET | Freq: Every day | ORAL | Status: DC
  Administered 2020-03-11 – 2020-03-15 (×5): 25 ug via ORAL
  Filled 2020-03-09 (×5): qty 1

## 2020-03-09 MED ORDER — POTASSIUM CHLORIDE CRYS ER 20 MEQ PO TBCR
40.0000 meq | EXTENDED_RELEASE_TABLET | Freq: Once | ORAL | Status: AC
  Administered 2020-03-09: 40 meq via ORAL
  Filled 2020-03-09: qty 2

## 2020-03-09 MED ORDER — HYDROCODONE-ACETAMINOPHEN 5-325 MG PO TABS
1.0000 | ORAL_TABLET | Freq: Four times a day (QID) | ORAL | Status: DC | PRN
  Administered 2020-03-11 – 2020-03-12 (×2): 1 via ORAL
  Filled 2020-03-09 (×2): qty 1

## 2020-03-09 MED ORDER — BUDESON-GLYCOPYRROL-FORMOTEROL 160-9-4.8 MCG/ACT IN AERO: 2.0000 | INHALATION_SPRAY | Freq: Two times a day (BID) | RESPIRATORY_TRACT | Status: DC

## 2020-03-09 MED ORDER — UMECLIDINIUM BROMIDE 62.5 MCG/INH IN AEPB
1.0000 | INHALATION_SPRAY | Freq: Every day | RESPIRATORY_TRACT | Status: DC
  Administered 2020-03-11 – 2020-03-15 (×5): 1 via RESPIRATORY_TRACT
  Filled 2020-03-09: qty 7

## 2020-03-09 MED ORDER — ARFORMOTEROL TARTRATE 15 MCG/2ML IN NEBU
15.0000 ug | INHALATION_SOLUTION | Freq: Two times a day (BID) | RESPIRATORY_TRACT | Status: DC
  Administered 2020-03-10 – 2020-03-15 (×11): 15 ug via RESPIRATORY_TRACT
  Filled 2020-03-09 (×11): qty 2

## 2020-03-09 MED ORDER — FUROSEMIDE 80 MG PO TABS
80.0000 mg | ORAL_TABLET | Freq: Every day | ORAL | Status: DC
  Administered 2020-03-10 – 2020-03-15 (×6): 80 mg via ORAL
  Filled 2020-03-09 (×6): qty 1

## 2020-03-09 MED ORDER — ONDANSETRON HCL 4 MG/2ML IJ SOLN
4.0000 mg | Freq: Once | INTRAMUSCULAR | Status: AC
  Administered 2020-03-09: 4 mg via INTRAVENOUS
  Filled 2020-03-09: qty 2

## 2020-03-09 MED ORDER — METOPROLOL TARTRATE 50 MG PO TABS
25.0000 mg | ORAL_TABLET | Freq: Two times a day (BID) | ORAL | Status: DC
  Administered 2020-03-09 – 2020-03-15 (×11): 25 mg via ORAL
  Filled 2020-03-09 (×11): qty 1

## 2020-03-09 MED ORDER — CALCIUM CARBONATE 1250 (500 CA) MG PO TABS
1.0000 | ORAL_TABLET | Freq: Every day | ORAL | Status: DC
  Administered 2020-03-11 – 2020-03-15 (×5): 500 mg via ORAL
  Filled 2020-03-09 (×7): qty 1

## 2020-03-09 MED ORDER — FAMOTIDINE 20 MG PO TABS
20.0000 mg | ORAL_TABLET | Freq: Every day | ORAL | Status: DC
  Administered 2020-03-09 – 2020-03-14 (×6): 20 mg via ORAL
  Filled 2020-03-09 (×6): qty 1

## 2020-03-09 MED ORDER — FENTANYL CITRATE (PF) 100 MCG/2ML IJ SOLN
50.0000 ug | INTRAMUSCULAR | Status: DC | PRN
  Administered 2020-03-09: 50 ug via INTRAVENOUS
  Filled 2020-03-09: qty 2

## 2020-03-09 MED ORDER — METHOCARBAMOL 1000 MG/10ML IJ SOLN
500.0000 mg | Freq: Four times a day (QID) | INTRAVENOUS | Status: DC | PRN
  Filled 2020-03-09: qty 5

## 2020-03-09 NOTE — H&P (Signed)
TRH H&P   Patient Demographics:    Crystal Cooper, is a 86 y.o. female  MRN: [id number]   DOB - 07-Aug-1926  Admit Date - 03/09/2020  Outpatient Primary MD for the patient is [name], MD  Referring MD/NP/PA: PA Harris  Patient coming from: Home  Chief Complaint  Patient presents with  . Leg Pain      HPI:    Crystal Cooper  is a 86 y.o. 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 fib due to GI bleed, patient presents to ED secondary to fall, patient with history of dementia, but she is able to provide some history, as well it was obtained with some assistance from daughter and ED staff, apparently patient had a mechanical fall today, she had one of the care givers with her, where she lost her footing, and fell, she was unable to stand back up, she had severe swelling of left ankle, and unable to move leg into normal position, as well had significant bruising in the left side of the head, so EMS were called, patient herself denies any dizziness or lightheadedness preceding before, she denies any focal deficits, tingling or numbness, no fever or chills, she is vaccinated for Covid x2. - in ED her work-up was significant for potassium 3.4, creatinine at baseline of 1.29, hemoglobin at baseline of 10.7, CT head and cervical spine with no acute findings, left hip x-ray significant for intertrochanteric fracture of proximal femur, Ortho were consulted, plan for surgery tomorrow, triage hospitalist consulted to admit.   Review of systems:    In addition to the HPI above,  patient with dementia, but overall she was able to provide appropriate review of systems No Fever-chills, No Headache, No changes with Vision or hearing, No problems swallowing food or Liquids, No Chest pain, Cough or Shortness of Breath, No Abdominal pain, No Nausea or Vommitting, Bowel movements are regular, No Blood in stool or Urine, No dysuria, No new skin rashes or bruises, She complains of left knee and hip pain. No new weakness, tingling, numbness in any extremity, No recent weight gain or loss, No polyuria, polydypsia or polyphagia, No significant Mental Stressors.  A full 10 point Review of Systems was done, except as stated above, all other Review of Systems were negative.   With Past History of the following :    Past Medical History:  Diagnosis Date  . AKI (acute kidney injury) ([place])   . Anemia    years ago  . Aortic stenosis   . Arthritis   . Asthma   . Atrial fibrillation ([place])   . CHF (congestive heart failure) ([place])  11/2014  . CKD (chronic kidney disease)   . Dementia ([place])   . Family history of adverse reaction to anesthesia    2 daughters would have N/V  . Gastric AVM   . GI bleed   . Glaucoma   . Hearing loss   . Heart murmur   . HTN (hypertension)   . Hypokalemia   . Pneumonia   . Prolapsing mitral leaflet syndrome   . Shortness of breath dyspnea    with exertion  . Stroke ([place])   . SVT (supraventricular tachycardia) (HCC)    S/P ablation of AVNRT in 2003      Past Surgical History:  Procedure Laterality Date  . APPENDECTOMY    . BIOPSY  04/07/2018   Procedure: BIOPSY;  Surgeon: Jerene Bears, MD;  Location: [facility] ENDOSCOPY;  Service: Gastroenterology;;  . BIOPSY  02/19/2020   Procedure: BIOPSY;  Surgeon: Harvel Quale, MD;  [facility];  Service: Gastroenterology;;  gastric   . BLADDER SURGERY    . CARDIAC CATHETERIZATION N/A 09/26/2015   Procedure: Right/Left Heart Cath and Coronary Angiography;  Surgeon: Burnell Blanks, MD;  Location: [place] CV LAB;  Service: Cardiovascular;  Laterality: N/A;  . CARDIAC SURGERY    . CARDIOVERSION N/A 03/02/2016   Procedure: CARDIOVERSION;  Surgeon: Thayer Headings, MD;  Location: [facility] ENDOSCOPY;  Service: Cardiovascular;  Laterality: N/A;  . ENTEROSCOPY  02/19/2020   Procedure: ENTEROSCOPY;  Surgeon: Harvel Quale, MD;  Location: [facility] ENDO [facility];  Service: Gastroenterology;;  . EP IMPLANTABLE DEVICE N/A 10/26/2015   Procedure: Pacemaker Implant;  Surgeon: Will Meredith Leeds, MD;  Location: [place] CV LAB;  Service: Cardiovascular;  Laterality: N/A;  . ESOPHAGOGASTRODUODENOSCOPY (EGD) WITH PROPOFOL N/A 04/07/2018   Procedure: ESOPHAGOGASTRODUODENOSCOPY (EGD) WITH PROPOFOL;  Surgeon: Jerene Bears, MD;  Location: [facility]COPY;  Service: Gastroenterology;  Laterality: N/A;  . ESOPHAGOGASTRODUODENOSCOPY (EGD) WITH PROPOFOL N/A 10/12/2019   Procedure: ESOPHAGOGASTRODUODENOSCOPY (EGD) WITH PROPOFOL;  Surgeon: Danie Binder, MD;  [facility];  Service: Endoscopy;  Laterality: N/A;  . EYE SURGERY Bilateral    cataract surgery  . FLEXIBLE SIGMOIDOSCOPY  02/19/2020   Procedure: FLEXIBLE SIGMOIDOSCOPY;  Surgeon: Montez Morita, Quillian Quince, MD;  [facility];  Service: Gastroenterology;;  . HOT HEMOSTASIS N/A 04/07/2018   Procedure: HOT HEMOSTASIS (ARGON PLASMA COAGULATION/BICAP);  Surgeon: Jerene Bears, MD;  Location[facility] END[facility]COPY;  Service: Gastroenterology;  Laterality: N/A;  . HOT HEMOSTASIS  10/12/2019   Procedure: HOT HEMOSTASIS (ARGON PLASMA COAGULATION/BICAP);  Surgeon: Danie Binder, MD;  [facility];  Service: Endoscopy;;  . NASAL SINUS SURGERY    . PACEMAKER INSERTION    . TEE WITHOUT CARDIOVERSION N/A 10/25/2015   Procedure: TRANSESOPHAGEAL ECHOCARDIOGRAM (TEE);  Surgeon: Burnell Blanks, MD;  Location: [place];  Service: Open Heart Surgery;  Laterality: N/A;  . TRANSCATHETER AORTIC VALVE REPLACEMENT,  TRANSFEMORAL Right 10/25/2015   Procedure: TRANSCATHETER AORTIC VALVE REPLACEMENT, TRANSFEMORAL;  Surgeon: Burnell Blanks, MD;  Location: [place];  Service: Open Heart Surgery;  Laterality: Right;      Social History:     Social History   Tobacco Use  . Smoking status: Never Smoker  . Smokeless tobacco: Never Used  Substance Use Topics  . Alcohol use: No    Alcohol/week: 0.0 standard drinks     Lives -at home  Mobility -with assistance    Family History :     Family History  Problem  Relation Age of Onset  . Hypertension Mother   . Pneumonia Father   . Stomach cancer Brother   . Stroke Brother   . AAA (abdominal aortic aneurysm) Brother   . Cervical cancer Daughter   . Heart attack Neg Hx   . Colon cancer Neg Hx   . Esophageal cancer Neg Hx      Home Medications:   Prior to Admission medications   Medication Sig Start Date End Date Taking? Authorizing Provider  acetaminophen (TYLENOL) 500 MG tablet Take 500 mg by mouth every 6 (six) hours as needed for mild pain or headache.     [provider]  albuterol (PROAIR HFA) 108 (90 Base) MCG/ACT inhaler Inhale 2 puffs into the lungs every 4 (four) hours as needed for wheezing or shortness of breath. 08/05/19   Baruch Gouty, FNP  Biotin 10 MG CAPS Take 10 mg by mouth daily.     [provider]  Budeson-Glycopyrrol-Formoterol (BREZTRI AEROSPHERE) 160-9-4.8 MCG/ACT AERO Inhale 2 Inhalers into the lungs in the morning and at bedtime. 11/27/19   Ivy Lynn, NP  Calcium Carb-Cholecalciferol (CALTRATE 600+D3) 600-800 MG-UNIT TABS Take 1 tablet by mouth daily.    [provider]  EUTHYROX 25 MCG tablet Take 1 tablet by mouth once daily 03/07/20   [name], MD  famotidine (PEPCID) 20 MG tablet Take 1 tablet (20 mg total) by mouth at bedtime. 12/21/19 12/20/20  [name], MD  ferrous sulfate 325 (65 FE) MG tablet Take 1 tablet (325 mg total) by mouth 2 (two) times daily with a meal.  04/08/18   Eulogio Bear U, DO  furosemide (LASIX) 20 MG tablet Take 4 tablets (80 mg total) by mouth daily. 02/22/20   Lelon Perla, MD  memantine (NAMENDA) 10 MG tablet Take 1 tablet (10 mg total) by mouth 2 (two) times daily. 01/04/20   Garvin Fila, MD  metoprolol tartrate (LOPRESSOR) 25 MG tablet Take 1 tablet (25 mg total) by mouth 2 (two) times daily. 05/20/19   Camnitz, Ocie Doyne, MD  multivitamin-iron-minerals-folic acid (CENTRUM) chewable tablet Chew 1 tablet by mouth daily.    [provider]  pantoprazole (PROTONIX) 40 MG tablet Take 1 tablet (40 mg total) by mouth 2 (two) times daily. 02/21/20   Orson Eva, MD  potassium chloride SA (KLOR-CON) 10 MEQ tablet Take 2 tablets (20 mEq total) by mouth 2 (two) times daily. 02/09/20   [name], MD     Allergies:     Allergies  Allergen Reactions  . Hctz [Hydrochlorothiazide] Other (See Comments)    Pt was ill and this affected her kidneys   . Aspirin Other (See Comments)    Cardiologist said the patient is to not take this  . Codeine Other (See Comments)    Made the patient feel ill, has not had any problems since 1977     Physical Exam:   Vitals  Blood pressure (!) 158/90, pulse 65, temperature 98.1 F (36.7 C), temperature source Oral, resp. rate 18, height 5\' 2"  (1.575 m), weight 51 kg, SpO2 96 %.   1. General frail, thin appearing elderly female, laying in bed, in no apparent distress.  2.  Patient is mildly demented, but hard of hearing, answering most questions appropriately, she is oriented x3   3. No F.N deficits, ALL C.Nerves Intact, Strength 5/5 all 4 extremities, Sensation intact all 4 extremities, Plantars down going.  4. Ears and Eyes appear Normal, Conjunctivae clear, PERRLA. Moist Oral  Mucosa.  5. Supple Neck, No JVD, No cervical lymphadenopathy appriciated, No Carotid Bruits.  6. Symmetrical Chest wall movement, Good air movement bilaterally, CTAB.  7. RRR, No Gallops, Rubs or  Murmurs, No Parasternal Heave.  8. Positive Bowel Sounds, Abdomen Soft, No tenderness, No organomegaly appriciated,No rebound -guarding or rigidity.  9.  No Cyanosis, Normal Skin Turgor, No Skin Rash or Bruise.  10. Good muscle tone, patient with left knee bruising, left hip tenderness to palpation.  11. No Palpable Lymph Nodes in Neck or Axillae    Data Review:    CBC Recent Labs  Lab 03/09/20 1353  WBC 6.0  HGB 10.7*  HCT 34.8*  PLT 200  MCV 100.6*  MCH 30.9  MCHC 30.7  RDW 14.7  LYMPHSABS 0.5*  MONOABS 0.5  EOSABS 0.0  BASOSABS 0.1   ------------------------------------------------------------------------------------------------------------------  Chemistries  Recent Labs  Lab 03/09/20 1353  NA 135  K 3.4*  CL 93*  CO2 30  GLUCOSE 137*  BUN 22  CREATININE 1.29*  CALCIUM 9.0   ------------------------------------------------------------------------------------------------------------------ estimated creatinine clearance is 22.5 mL/min (A) (by C-G formula based on SCr of 1.29 mg/dL (H)). ------------------------------------------------------------------------------------------------------------------ No results for input(s): TSH, T4TOTAL, T3FREE, THYROIDAB in the last 72 hours.  Invalid input(s): FREET3  Coagulation profile Recent Labs  Lab 03/09/20 1353  INR 1.1   ------------------------------------------------------------------------------------------------------------------- No results for input(s): DDIMER in the last 72 hours. -------------------------------------------------------------------------------------------------------------------  Cardiac Enzymes No results for input(s): CKMB, TROPONINI, MYOGLOBIN in the last 168 hours.  Invalid input(s): CK ------------------------------------------------------------------------------------------------------------------    Component Value Date/Time   BNP 313.0 (H) 02/12/2020 0752      ---------------------------------------------------------------------------------------------------------------  Urinalysis    Component Value Date/Time   COLORURINE YELLOW 02/12/2020 1353   APPEARANCEUR CLEAR 02/12/2020 1353   APPEARANCEUR Hazy (A) 02/09/2020 1400   LABSPEC 1.012 02/12/2020 1353   PHURINE 7.0 02/12/2020 1353   GLUCOSEU NEGATIVE 02/12/2020 1353   HGBUR NEGATIVE 02/12/2020 [employer] 02/12/2020 1353   BILIRUBINUR Negative 02/09/2020 1400   KETONESUR NEGATIVE 02/12/2020 1353   PROTEINUR NEGATIVE 02/12/2020 1353   NITRITE NEGATIVE 02/12/2020 1353   LEUKOCYTESUR NEGATIVE 02/12/2020 1353    ----------------------------------------------------------------------------------------------------------------   Imaging Results:    DG Chest 1 View  Result Date: 03/09/2020 CLINICAL DATA:  Fall. EXAM: CHEST  1 VIEW COMPARISON:  February 29, 2020. FINDINGS: Stable cardiomegaly. Status post transcatheter aortic valve repair. Left-sided pacemaker is unchanged in position. No pneumothorax or pleural effusion is noted. Both lungs are clear. The visualized skeletal structures are unremarkable. IMPRESSION: No active disease. Aortic Atherosclerosis (ICD10-I70.0). Electronically Signed   By: Marijo Conception M.D.   On: 03/09/2020 14:37   CT HEAD WO CONTRAST  Result Date: 03/09/2020 CLINICAL DATA:  Head trauma, minor. Poly trauma, critical, head/cervical spine injury suspected. Additional history obtained from [place] slid out of bed and hit left side of head, bruising noted to left side of forehead, patient reports left leg pain. EXAM: CT HEAD WITHOUT CONTRAST CT CERVICAL SPINE WITHOUT CONTRAST TECHNIQUE: Multidetector CT imaging of the head and cervical spine was performed following the standard protocol without intravenous contrast. Multiplanar CT image reconstructions of the cervical spine were also generated. COMPARISON:  Brain MRI  12/02/2019.  Head CT 08/25/2019. FINDINGS: CT HEAD FINDINGS Brain: Stable, moderate generalized parenchymal atrophy. Known chronic lacunar infarcts within the right corona radiata, basal ganglia, thalami and bilateral cerebellar hemispheres, some of which were better appreciated on the MRI of 12/02/2019. There is no acute intracranial  hemorrhage. No demarcated cortical infarct. No extra-axial fluid collection. No evidence of intracranial mass. No midline shift. Vascular: No hyperdense vessel.  Atherosclerotic calcifications Skull: Normal. Negative for fracture or focal lesion. Sinuses/Orbits: Visualized orbits show no acute finding. Scattered mild mucosal thickening and frothy secretions within the ethmoid air cells. No significant mastoid effusion. Other: Mild left frontal scalp/forehead soft tissue swelling. CT CERVICAL SPINE FINDINGS Mildly motion degraded examination Alignment: Cervical levocurvature. Straightening of the expected cervical lordosis. No significant spondylolisthesis. Skull base and vertebrae: The basion-dental and atlanto-dental intervals are maintained.No evidence of acute fracture to the cervical spine. Soft tissues and spinal canal: No prevertebral fluid or swelling. No visible canal hematoma. Disc levels: Cervical spondylosis with multilevel disc space narrowing, posterior disc osteophytes, uncovertebral and facet hypertrophy. A prominent C5-C6 posterior disc osteophyte complex contributes to severe bony spinal canal stenosis Upper chest: No consolidation within the imaged lung apices. No visible pneumothorax. IMPRESSION: CT head: 1. No evidence of acute intracranial abnormality. 2. Mild left frontal scalp/forehead soft tissue swelling. 3. Stable moderate generalized parenchymal atrophy and advanced chronic small vessel ischemic disease with multiple chronic lacunar infarcts as described. 4. Ethmoid sinusitis. CT cervical spine: 1. Mildly motion degraded examination. 2. No evidence of acute  fracture to the cervical spine. 3. Cervical spondylosis as described. Most notably, a prominent C5-C6 posterior disc osteophyte complex contributes to severe spinal canal stenosis at this level, likely with some degree of spinal cord mass effect. Consider cervical spine MRI for further evaluation, as clinically warranted. Electronically Signed   By: Kellie Simmering DO   On: 03/09/2020 14:35   CT CERVICAL SPINE WO CONTRAST  Result Date: 03/09/2020 CLINICAL DATA:  Head trauma, minor. Poly trauma, critical, head/cervical spine injury suspected. Additional history obtained from [place] slid out of bed and hit left side of head, bruising noted to left side of forehead, patient reports left leg pain. EXAM: CT HEAD WITHOUT CONTRAST CT CERVICAL SPINE WITHOUT CONTRAST TECHNIQUE: Multidetector CT imaging of the head and cervical spine was performed following the standard protocol without intravenous contrast. Multiplanar CT image reconstructions of the cervical spine were also generated. COMPARISON:  Brain MRI 12/02/2019.  Head CT 08/25/2019. FINDINGS: CT HEAD FINDINGS Brain: Stable, moderate generalized parenchymal atrophy. Known chronic lacunar infarcts within the right corona radiata, basal ganglia, thalami and bilateral cerebellar hemispheres, some of which were better appreciated on the MRI of 12/02/2019. There is no acute intracranial hemorrhage. No demarcated cortical infarct. No extra-axial fluid collection. No evidence of intracranial mass. No midline shift. Vascular: No hyperdense vessel.  Atherosclerotic calcifications Skull: Normal. Negative for fracture or focal lesion. Sinuses/Orbits: Visualized orbits show no acute finding. Scattered mild mucosal thickening and frothy secretions within the ethmoid air cells. No significant mastoid effusion. Other: Mild left frontal scalp/forehead soft tissue swelling. CT CERVICAL SPINE FINDINGS Mildly motion degraded examination Alignment: Cervical  levocurvature. Straightening of the expected cervical lordosis. No significant spondylolisthesis. Skull base and vertebrae: The basion-dental and atlanto-dental intervals are maintained.No evidence of acute fracture to the cervical spine. Soft tissues and spinal canal: No prevertebral fluid or swelling. No visible canal hematoma. Disc levels: Cervical spondylosis with multilevel disc space narrowing, posterior disc osteophytes, uncovertebral and facet hypertrophy. A prominent C5-C6 posterior disc osteophyte complex contributes to severe bony spinal canal stenosis Upper chest: No consolidation within the imaged lung apices. No visible pneumothorax. IMPRESSION: CT head: 1. No evidence of acute intracranial abnormality. 2. Mild left frontal scalp/forehead soft tissue swelling. 3. Stable moderate generalized  parenchymal atrophy and advanced chronic small vessel ischemic disease with multiple chronic lacunar infarcts as described. 4. Ethmoid sinusitis. CT cervical spine: 1. Mildly motion degraded examination. 2. No evidence of acute fracture to the cervical spine. 3. Cervical spondylosis as described. Most notably, a prominent C5-C6 posterior disc osteophyte complex contributes to severe spinal canal stenosis at this level, likely with some degree of spinal cord mass effect. Consider cervical spine MRI for further evaluation, as clinically warranted. Electronically Signed   By: Kellie Simmering DO   On: 03/09/2020 14:35   DG Knee Complete 4 Views Left  Result Date: 03/09/2020 CLINICAL DATA:  Left knee pain after fall. EXAM: LEFT KNEE - COMPLETE 4+ VIEW COMPARISON:  None. FINDINGS: No evidence of fracture, dislocation, or joint effusion. Moderate narrowing of the medial and lateral joint spaces are noted. Moderate patellar spurring is noted. Vascular calcifications are noted. IMPRESSION: Moderate degenerative joint disease. No acute abnormality seen in the left knee. Electronically Signed   By: Marijo Conception M.D.   On:  03/09/2020 14:40   DG Hip Unilat With Pelvis 2-3 Views Left  Result Date: 03/09/2020 CLINICAL DATA:  Left hip pain after fall. EXAM: DG HIP (WITH OR WITHOUT PELVIS) 2-3V LEFT COMPARISON:  None. FINDINGS: Moderately angulated and probably comminuted fracture is seen involving the intertrochanteric region of the proximal left femur. Right hip is unremarkable. IMPRESSION: Moderately angulated and probably comminuted intertrochanteric fracture of proximal left femur. Electronically Signed   By: Marijo Conception M.D.   On: 03/09/2020 14:38    My personal review of EKG: Pending   Assessment & Plan:    Active Problems:   Essential hypertension   PAF (paroxysmal atrial fibrillation) (HCC)   Duodenal arteriovenous malformation   GI bleed   S/P TAVR (transcatheter aortic valve replacement)   Diastolic dysfunction   Congestive heart failure (HCC)   AVM (arteriovenous malformation)   COPD (chronic obstructive pulmonary disease) (HCC)   Dementia without behavioral disturbance (HCC)   CKD (chronic kidney disease) stage 3, GFR 30-59 ml/min   Left hip fracture -She is admitted under hip fracture pathway, as needed medications have been ordered, SCD for DVT prophylaxis, n.p.o. after midnight. -Dr. Aline Brochure has been consulted by ED, plan for surgery tomorrow. -We will obtain EKG for baseline .  Chronic diastolic CHF -No evidence of volume overload, continue with home dose Lasix  Hypertension -Continue with metoprolol and home dose Lasix.  Paroxysmal atrial fibrillation -Her EKG is pending. -She is off anticoagulation due to recent GI bleed.  COPD -No wheezing, she is on room air, continue with incentive spirometry, continue with bretzeri or available formulary and start active, she will be encouraged with incentive spirometry after her surgery, and will start on scheduled duo nebs.  Mixed vascular and Alzheimer's dementia -continue namenda  Junctional bradycardia status post TAVR -She  has PPM -EKG is pending  CKD stage IIIb -Baseline creatinine 1.3-1.6  Hypothyroidism -continue synthroid   DVT Prophylaxis :SCDs for now, [place] Lovenox after surgery  AM Labs Ordered, also please review Full Orders  Family Communication: Admission, patients condition and plan of care including tests being ordered have been discussed with the patient and daughter Quita Skye by phone [phone]  who indicate understanding and agree with the plan and Code Status.  Code Status Full  Likely DC to  : pending PT evaluation  Condition GUARDED    Consults called: Ortho by ED Dr Jarvis Morgan  Admission status: Inpatient    Time spent in  minutes : 60 minutes   Phillips Climes M.D on 03/09/2020 at 3:59 PM   [facility] - Office  [phone]

## 2020-03-09 NOTE — ED Notes (Signed)
Hospitalist taking to pts Daughter.

## 2020-03-09 NOTE — ED Provider Notes (Signed)
[facility] EMERGENCY DEPARTMENT Provider Note   CSN: [id number] Arrival date & time: 03/09/20  1317     History Chief Complaint  Patient presents with  . Leg Pain    Crystal Cooper is a 86 y.o. female who  has a past medical history of AKI (acute kidney injury) ([place]), Anemia, Aortic stenosis, Arthritis, Asthma, Atrial fibrillation ([place]), CHF (congestive heart failure) ([place]) (11/2014), CKD (chronic kidney disease), Dementia ([place]), Family history of adverse reaction to anesthesia, Gastric AVM, GI bleed, Glaucoma, Hearing loss, Heart murmur, HTN (hypertension), Hypokalemia, Pneumonia, Prolapsing mitral leaflet syndrome, Shortness of breath dyspnea, Stroke ([place]), and SVT (supraventricular tachycardia) ([place]).  There is a level 5 caveat due to dementia.  She presents for left hip and leg pain after mechanical fall.  Patient states that she slipped while she was walking, fell onto her left side.  She has a large left forehead hematoma and obvious left hip deformity.  She complains of pain all along her left side. Additional history is obtained from the patient's daughter Synetta Fail by phone.  She states that her mom was in the room with one of her home health workers.  Apparently she lost her footing and fell.  She was unable to get back up off of the floor.  She had severe swelling in the left knee and was unable to move the leg into a normal position.  She also had a big hematoma develop on the left side of her head.  She was unable to ambulate after the fall.   HPI     Past Medical History:  Diagnosis Date  . AKI (acute kidney injury) ([place])   . Anemia    years ago  . Aortic stenosis   . Arthritis   . Asthma   . Atrial fibrillation ([place])   . CHF (congestive heart failure) ([place]) 11/2014  . CKD (chronic kidney disease)   . Dementia ([place])   . Family history of adverse reaction to anesthesia    2 daughters would have N/V  . Gastric AVM   . GI bleed   . Glaucoma   . Hearing loss    . Heart murmur   . HTN (hypertension)   . Hypokalemia   . Pneumonia   . Prolapsing mitral leaflet syndrome   . Shortness of breath dyspnea    with exertion  . Stroke ([place])   . SVT (supraventricular tachycardia) ([facility])    S/P ablation of AVNRT in 2003    Patient Active Problem List   Diagnosis Date Noted  . Closed left hip fracture ([place]) 03/09/2020  . Upper GI bleed 02/19/2020  . Acute blood loss anemia 02/19/2020  . CKD (chronic kidney disease) stage 3, GFR 30-59 ml/min 02/19/2020  . Hematochezia 02/19/2020  . Iron deficiency anemia due to chronic blood loss 02/18/2020  . AVM (arteriovenous malformation) 02/18/2020  . COPD (chronic obstructive pulmonary disease) ([place]) 02/18/2020  . Dementia without behavioral disturbance ([place]) 02/18/2020  . SOB (shortness of breath)   . COPD with acute exacerbation ([place])   . CAP (community acquired pneumonia) 02/12/2020  . Bilateral impacted cerumen 11/27/2019  . Long term (current) use of anticoagulants   . Acute renal failure superimposed on stage 3b chronic kidney disease ([place])   . Wheezing 09/23/2019  . Abnormal breath sounds 09/09/2019  . Heme positive stool   . Acute CVA (cerebrovascular accident) ([place]) 08/27/2019  . PNA (pneumonia) 08/25/2019  . Hypokalemia 08/23/2019  . Angio-edema, initial encounter 08/23/2019  .  Reactive airway disease 03/24/2019  . Gastroesophageal reflux disease 03/24/2019  . CKD (chronic kidney disease), stage III 11/12/2018  . GI bleed 05/12/2018  . 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 Diastolic dysfunction 16/38/4665  . Pacemaker 05/12/2018  . Iron deficiency anemia   . Melena   . Duodenal arteriovenous malformation   . Gastritis and gastroduodenitis   . Normocytic anemia 04/06/2018  . Osteoporosis 08/27/2016  . ASCVD (arteriosclerotic cardiovascular disease) 01/11/2016  . Asthma 01/11/2016  . Glaucoma 01/11/2016  . Hearing loss 01/11/2016  . Other nonrheumatic mitral  valve disorders 01/11/2016  . Junctional bradycardia   . PAF (paroxysmal atrial fibrillation) ([place]) 09/06/2015  . Hyponatremia 12/17/2014  . Congestive heart failure ([place]) 12/12/2014  . Murmur 10/19/2011  . Chest pain 10/19/2011  . Essential hypertension 10/19/2011  . History of cardiac radiofrequency ablation (RFA) 10/19/2011    Past Surgical History:  Procedure Laterality Date  . APPENDECTOMY    . BIOPSY  04/07/2018   Procedure: BIOPSY;  Surgeon: Jerene Bears, MD;  Location: [facility] ENDOSCOPY;  Service: Gastroenterology;;  . BIOPSY  02/19/2020   Procedure: BIOPSY;  Surgeon: Harvel Quale, MD;  Location[facility] ENDO SUITE;  Service: Gastroenterology;;  gastric   . BLADDER SURGERY    . CARDIAC CATHETERIZATION N/A 09/26/2015   Procedure: Right/Left Heart Cath and Coronary Angiography;  Surgeon: Burnell Blanks, MD;  Location: [place] CV LAB;  Service: Cardiovascular;  Laterality: N/A;  . CARDIAC SURGERY    . CARDIOVERSION N/A 03/02/2016   Procedure: CARDIOVERSION;  Surgeon: Thayer Headings, MD;  Location: [facility] ENDOSCOPY;  Service: Cardiovascular;  Laterality: N/A;  . ENTEROSCOPY  02/19/2020   Procedure: ENTEROSCOPY;  Surgeon: Harvel Quale, MD;  Location: [facility] ENDO SUITE;  Service: Gastroenterology;;  . EP IMPLANTABLE DEVICE N/A 10/26/2015   Procedure: Pacemaker Implant;  Surgeon: Will Meredith Leeds, MD;  Location: [place] CV LAB;  Service: Cardiovascular;  Laterality: N/A;  . ESOPHAGOGASTRODUODENOSCOPY (EGD) WITH PROPOFOL N/A 04/07/2018   Procedure: ESOPHAGOGASTRODUODENOSCOPY (EGD) WITH PROPOFOL;  Surgeon: Jerene Bears, MD;  Location: [facility] ENDOSCOPY;  Service: Gastroenterology;  Laterality: N/A;  . ESOPHAGOGASTRODUODENOSCOPY (EGD) WITH PROPOFOL N/A 10/12/2019   Procedure: ESOPHAGOGASTRODUODENOSCOPY (EGD) WITH PROPOFOL;  Surgeon: Danie Binder, MD;  Location[facility] ENDO SU[facility];  Service: Endoscopy;  Laterality: N/A;  . EYE SURGERY Bilateral    cataract surgery  .  FLEXIBLE SIGMOIDOSCOPY  02/19/2020   Procedure: FLEXIBLE SIGMOIDOSCOPY;  Surgeon: Montez Morita, Quillian Quince, MD;  Location: [facility] ENDO SU[facility];  Service: Gastroenterology;;  . HOT HEMOSTASIS N/A 04/07/2018   Procedure: HOT HEMOSTASIS (ARGON PLASMA COAGULATION/BICAP);  Surgeon: Jerene Bears, MD;  Location[facility] ENDOSCOPY;  Service: Gastroenterology;  Laterality: N/A;  . HOT HEMOSTASIS  10/12/2019   Procedure: HOT HEMOSTASIS (ARGON PLASMA COAGULATION/BICAP);  Surgeon: Danie Binder, MD;  Location: [facility] ENDO SU[facility];  Service: Endoscopy;;  . NASAL SINUS SURGERY    . PACEMAKER INSERTION    . TEE WITHOUT CARDIOVERSION N/A 10/25/2015   Procedure: TRANSESOPHAGEAL ECHOCARDIOGRAM (TEE);  Surgeon: Burnell Blanks, MD;  Location: [place];  Service: Open Heart Surgery;  Laterality: N/A;  . TRANSCATHETER AORTIC VALVE REPLACEMENT, TRANSFEMORAL Right 10/25/2015   Procedure: TRANSCATHETER AORTIC VALVE REPLACEMENT, TRANSFEMORAL;  Surgeon: Burnell Blanks, MD;  Location: [place];  Service: Open Heart Surgery;  Laterality: Right;     OB History    Gravida  3   Para  3   Term  3   Preterm  AB      Living  2     SAB      TAB      Ectopic      Multiple      Live Births              Family History  Problem Relation Age of Onset  . Hypertension Mother   . Pneumonia Father   . Stomach cancer Brother   . Stroke Brother   . AAA (abdominal aortic aneurysm) Brother   . Cervical cancer Daughter   . Heart attack Neg Hx   . Colon cancer Neg Hx   . Esophageal cancer Neg Hx     Social History   Tobacco Use  . Smoking status: Never Smoker  . Smokeless tobacco: Never Used  Vaping Use  . Vaping Use: Never used  Substance Use Topics  . Alcohol use: No    Alcohol/week: 0.0 standard drinks  . Drug use: No    Home Medications Prior to Admission medications   Medication Sig Start Date End Date Taking? Authorizing Provider  acetaminophen (TYLENOL) 500 MG tablet Take 500 mg by mouth every  6 (six) hours as needed for mild pain or headache.     [provider]  albuterol (PROAIR HFA) 108 (90 Base) MCG/ACT inhaler Inhale 2 puffs into the lungs every 4 (four) hours as needed for wheezing or shortness of breath. 08/05/19   Baruch Gouty, FNP  Biotin 10 MG CAPS Take 10 mg by mouth daily.     [provider]  Budeson-Glycopyrrol-Formoterol (BREZTRI AEROSPHERE) 160-9-4.8 MCG/ACT AERO Inhale 2 Inhalers into the lungs in the morning and at bedtime. 11/27/19   Ivy Lynn, NP  Calcium Carb-Cholecalciferol (CALTRATE 600+D3) 600-800 MG-UNIT TABS Take 1 tablet by mouth daily.    [provider]  EUTHYROX 25 MCG tablet Take 1 tablet by mouth once daily 03/07/20   [name], MD  famotidine (PEPCID) 20 MG tablet Take 1 tablet (20 mg total) by mouth at bedtime. 12/21/19 12/20/20  [name], MD  ferrous sulfate 325 (65 FE) MG tablet Take 1 tablet (325 mg total) by mouth 2 (two) times daily with a meal. 04/08/18   Eulogio Bear U, DO  furosemide (LASIX) 20 MG tablet Take 4 tablets (80 mg total) by mouth daily. 02/22/20   Lelon Perla, MD  memantine (NAMENDA) 10 MG tablet Take 1 tablet (10 mg total) by mouth 2 (two) times daily. 01/04/20   Garvin Fila, MD  metoprolol tartrate (LOPRESSOR) 25 MG tablet Take 1 tablet (25 mg total) by mouth 2 (two) times daily. 05/20/19   Camnitz, Ocie Doyne, MD  multivitamin-iron-minerals-folic acid (CENTRUM) chewable tablet Chew 1 tablet by mouth daily.    [provider]  pantoprazole (PROTONIX) 40 MG tablet Take 1 tablet (40 mg total) by mouth 2 (two) times daily. 02/21/20   Orson Eva, MD  potassium chloride SA (KLOR-CON) 10 MEQ tablet Take 2 tablets (20 mEq total) by mouth 2 (two) times daily. 02/09/20   [name], MD    Allergies    Hctz [hydrochlorothiazide], Aspirin, and Codeine  Review of Systems   Review of Systems Ten systems reviewed and are negative for acute change, except as noted in the HPI.    Physical Exam Updated Vital Signs BP (!) 158/90 (BP Location: Right Arm)   Pulse 65   Temp 98.1 F (36.7 C) (Oral)   Resp 18   Ht 5\' 2"  (1.575 m)  Wt 51 kg   SpO2 96%   BMI 20.56 kg/m   Physical Exam Vitals and nursing note reviewed.  Constitutional:      General: She is not in acute distress.    Appearance: She is well-developed. She is not diaphoretic.  HENT:     Head: Normocephalic.     Comments:   Large hematoma of the left forehead Eyes:     General: No scleral icterus.    Conjunctiva/sclera: Conjunctivae normal.  Cardiovascular:     Rate and Rhythm: Normal rate and regular rhythm.     Pulses:          Dorsalis pedis pulses are 2+ on the right side and 2+ on the left side.       Posterior tibial pulses are 2+ on the right side and 2+ on the left side.     Heart sounds: Normal heart sounds. No murmur heard.  No friction rub. No gallop.   Pulmonary:     Effort: Pulmonary effort is normal. No respiratory distress.     Breath sounds: Normal breath sounds.  Abdominal:     General: Bowel sounds are normal. There is no distension.     Palpations: Abdomen is soft. There is no mass.     Tenderness: There is no abdominal tenderness. There is no guarding.  Musculoskeletal:     Cervical back: Normal range of motion.     Comments: Left leg is shortened and laterally rotated.  Palpable deformity at the left hip and trochanter with swelling and tenderness. L knee mild swelling   Skin:    General: Skin is warm and dry.  Neurological:     Mental Status: She is alert and oriented to person, place, and time.  Psychiatric:        Behavior: Behavior normal.     ED Results / Procedures / Treatments   Labs (all labs ordered are listed, but only abnormal results are displayed) Labs Reviewed  BASIC METABOLIC PANEL - Abnormal; Notable for the following components:      Result Value   Potassium 3.4 (*)    Chloride 93 (*)    Glucose, Bld 137 (*)    Creatinine, Ser 1.29 (*)     GFR calc non Af Amer 36 (*)    GFR calc Af Amer 42 (*)    All other components within normal limits  CBC WITH DIFFERENTIAL/PLATELET - Abnormal; Notable for the following components:   RBC 3.46 (*)    Hemoglobin 10.7 (*)    HCT 34.8 (*)    MCV 100.6 (*)    Lymphs Abs 0.5 (*)    All other components within normal limits  SARS CORONAVIRUS 2 BY RT PCR (HOSPITAL ORDER, [place] [facility])  PROTIME-INR  CBC  BASIC METABOLIC PANEL  TYPE AND SCREEN    EKG None  Radiology DG Chest 1 View  Result Date: 03/09/2020 CLINICAL DATA:  Fall. EXAM: CHEST  1 VIEW COMPARISON:  February 29, 2020. FINDINGS: Stable cardiomegaly. Status post transcatheter aortic valve repair. Left-sided pacemaker is unchanged in position. No pneumothorax or pleural effusion is noted. Both lungs are clear. The visualized skeletal structures are unremarkable. IMPRESSION: No active disease. Aortic Atherosclerosis (ICD10-I70.0). Electronically Signed   By: Marijo Conception M.D.   On: 03/09/2020 14:37   CT HEAD WO CONTRAST  Result Date: 03/09/2020 CLINICAL DATA:  Head trauma, minor. Poly trauma, critical, head/cervical spine injury suspected. Additional history obtained from [place]  Patient slid out of bed and hit left side of head, bruising noted to left side of forehead, patient reports left leg pain. EXAM: CT HEAD WITHOUT CONTRAST CT CERVICAL SPINE WITHOUT CONTRAST TECHNIQUE: Multidetector CT imaging of the head and cervical spine was performed following the standard protocol without intravenous contrast. Multiplanar CT image reconstructions of the cervical spine were also generated. COMPARISON:  Brain MRI 12/02/2019.  Head CT 08/25/2019. FINDINGS: CT HEAD FINDINGS Brain: Stable, moderate generalized parenchymal atrophy. Known chronic lacunar infarcts within the right corona radiata, basal ganglia, thalami and bilateral cerebellar hemispheres, some of which were better appreciated on the MRI  of 12/02/2019. There is no acute intracranial hemorrhage. No demarcated cortical infarct. No extra-axial fluid collection. No evidence of intracranial mass. No midline shift. Vascular: No hyperdense vessel.  Atherosclerotic calcifications Skull: Normal. Negative for fracture or focal lesion. Sinuses/Orbits: Visualized orbits show no acute finding. Scattered mild mucosal thickening and frothy secretions within the ethmoid air cells. No significant mastoid effusion. Other: Mild left frontal scalp/forehead soft tissue swelling. CT CERVICAL SPINE FINDINGS Mildly motion degraded examination Alignment: Cervical levocurvature. Straightening of the expected cervical lordosis. No significant spondylolisthesis. Skull base and vertebrae: The basion-dental and atlanto-dental intervals are maintained.No evidence of acute fracture to the cervical spine. Soft tissues and spinal canal: No prevertebral fluid or swelling. No visible canal hematoma. Disc levels: Cervical spondylosis with multilevel disc space narrowing, posterior disc osteophytes, uncovertebral and facet hypertrophy. A prominent C5-C6 posterior disc osteophyte complex contributes to severe bony spinal canal stenosis Upper chest: No consolidation within the imaged lung apices. No visible pneumothorax. IMPRESSION: CT head: 1. No evidence of acute intracranial abnormality. 2. Mild left frontal scalp/forehead soft tissue swelling. 3. Stable moderate generalized parenchymal atrophy and advanced chronic small vessel ischemic disease with multiple chronic lacunar infarcts as described. 4. Ethmoid sinusitis. CT cervical spine: 1. Mildly motion degraded examination. 2. No evidence of acute fracture to the cervical spine. 3. Cervical spondylosis as described. Most notably, a prominent C5-C6 posterior disc osteophyte complex contributes to severe spinal canal stenosis at this level, likely with some degree of spinal cord mass effect. Consider cervical spine MRI for further  evaluation, as clinically warranted. Electronically Signed   By: Kellie Simmering DO   On: 03/09/2020 14:35   CT CERVICAL SPINE WO CONTRAST  Result Date: 03/09/2020 CLINICAL DATA:  Head trauma, minor. Poly trauma, critical, head/cervical spine injury suspected. Additional history obtained from [place] slid out of bed and hit left side of head, bruising noted to left side of forehead, patient reports left leg pain. EXAM: CT HEAD WITHOUT CONTRAST CT CERVICAL SPINE WITHOUT CONTRAST TECHNIQUE: Multidetector CT imaging of the head and cervical spine was performed following the standard protocol without intravenous contrast. Multiplanar CT image reconstructions of the cervical spine were also generated. COMPARISON:  Brain MRI 12/02/2019.  Head CT 08/25/2019. FINDINGS: CT HEAD FINDINGS Brain: Stable, moderate generalized parenchymal atrophy. Known chronic lacunar infarcts within the right corona radiata, basal ganglia, thalami and bilateral cerebellar hemispheres, some of which were better appreciated on the MRI of 12/02/2019. There is no acute intracranial hemorrhage. No demarcated cortical infarct. No extra-axial fluid collection. No evidence of intracranial mass. No midline shift. Vascular: No hyperdense vessel.  Atherosclerotic calcifications Skull: Normal. Negative for fracture or focal lesion. Sinuses/Orbits: Visualized orbits show no acute finding. Scattered mild mucosal thickening and frothy secretions within the ethmoid air cells. No significant mastoid effusion. Other: Mild left frontal scalp/forehead soft tissue swelling. CT CERVICAL SPINE  FINDINGS Mildly motion degraded examination Alignment: Cervical levocurvature. Straightening of the expected cervical lordosis. No significant spondylolisthesis. Skull base and vertebrae: The basion-dental and atlanto-dental intervals are maintained.No evidence of acute fracture to the cervical spine. Soft tissues and spinal canal: No prevertebral  fluid or swelling. No visible canal hematoma. Disc levels: Cervical spondylosis with multilevel disc space narrowing, posterior disc osteophytes, uncovertebral and facet hypertrophy. A prominent C5-C6 posterior disc osteophyte complex contributes to severe bony spinal canal stenosis Upper chest: No consolidation within the imaged lung apices. No visible pneumothorax. IMPRESSION: CT head: 1. No evidence of acute intracranial abnormality. 2. Mild left frontal scalp/forehead soft tissue swelling. 3. Stable moderate generalized parenchymal atrophy and advanced chronic small vessel ischemic disease with multiple chronic lacunar infarcts as described. 4. Ethmoid sinusitis. CT cervical spine: 1. Mildly motion degraded examination. 2. No evidence of acute fracture to the cervical spine. 3. Cervical spondylosis as described. Most notably, a prominent C5-C6 posterior disc osteophyte complex contributes to severe spinal canal stenosis at this level, likely with some degree of spinal cord mass effect. Consider cervical spine MRI for further evaluation, as clinically warranted. Electronically Signed   By: Kellie Simmering DO   On: 03/09/2020 14:35   DG Knee Complete 4 Views Left  Result Date: 03/09/2020 CLINICAL DATA:  Left knee pain after fall. EXAM: LEFT KNEE - COMPLETE 4+ VIEW COMPARISON:  None. FINDINGS: No evidence of fracture, dislocation, or joint effusion. Moderate narrowing of the medial and lateral joint spaces are noted. Moderate patellar spurring is noted. Vascular calcifications are noted. IMPRESSION: Moderate degenerative joint disease. No acute abnormality seen in the left knee. Electronically Signed   By: Marijo Conception M.D.   On: 03/09/2020 14:40   DG Hip Unilat With Pelvis 2-3 Views Left  Result Date: 03/09/2020 CLINICAL DATA:  Left hip pain after fall. EXAM: DG HIP (WITH OR WITHOUT PELVIS) 2-3V LEFT COMPARISON:  None. FINDINGS: Moderately angulated and probably comminuted fracture is seen involving the  intertrochanteric region of the proximal left femur. Right hip is unremarkable. IMPRESSION: Moderately angulated and probably comminuted intertrochanteric fracture of proximal left femur. Electronically Signed   By: Marijo Conception M.D.   On: 03/09/2020 14:38    Procedures Procedures (including critical care time)  Medications Ordered in ED Medications  0.9 %  sodium chloride infusion ( Intravenous New Bag/Given 03/09/20 1428)  fentaNYL (SUBLIMAZE) injection 50 mcg (50 mcg Intravenous Given 03/09/20 1432)  metoprolol tartrate (LOPRESSOR) tablet 25 mg (has no administration in time range)  memantine (NAMENDA) tablet 10 mg (has no administration in time range)  levothyroxine (SYNTHROID) tablet 25 mcg (25 mcg Oral Not Given 03/09/20 1622)  famotidine (PEPCID) tablet 20 mg (has no administration in time range)  pantoprazole (PROTONIX) EC tablet 40 mg (has no administration in time range)  ferrous sulfate tablet 325 mg (325 mg Oral Not Given 03/09/20 1621)  multivitamin with minerals tablet 1 tablet (1 tablet Oral Not Given 03/09/20 1624)  potassium chloride SA (KLOR-CON) CR tablet 20 mEq (has no administration in time range)  albuterol (VENTOLIN HFA) 108 (90 Base) MCG/ACT inhaler 2 puff (has no administration in time range)  HYDROcodone-acetaminophen (NORCO/VICODIN) 5-325 MG per tablet 1-2 tablet (has no administration in time range)  morphine 2 MG/ML injection 0.5 mg (has no administration in time range)  methocarbamol (ROBAXIN) tablet 500 mg (has no administration in time range)    Or  methocarbamol (ROBAXIN) 500 mg in dextrose 5 % 50 mL IVPB (has no administration  in time range)  docusate sodium (COLACE) capsule 100 mg (has no administration in time range)  furosemide (LASIX) tablet 80 mg (has no administration in time range)  calcium carbonate (OS-CAL - dosed in mg of elemental calcium) tablet 500 mg of elemental calcium (has no administration in time range)    And  cholecalciferol (VITAMIN  D3) tablet 800 Units (has no administration in time range)  umeclidinium bromide (INCRUSE ELLIPTA) 62.5 MCG/INH 1 puff (has no administration in time range)    And  arformoterol (BROVANA) nebulizer solution 15 mcg (has no administration in time range)  budesonide (PULMICORT) nebulizer solution 0.25 mg (has no administration in time range)  ondansetron (ZOFRAN) injection 4 mg (4 mg Intravenous Given 03/09/20 1432)  potassium chloride SA (KLOR-CON) CR tablet 40 mEq (40 mEq Oral Given 03/09/20 1620)    ED Course  I have reviewed the triage vital signs and the nursing notes.  Pertinent labs & imaging results that were available during my care of the patient were reviewed by me and considered in my medical decision making (see chart for details).    MDM Rules/Calculators/A&P                           TK:ZSWF/ traumatic injury VS: BP (!) 158/90 (BP Location: Right Arm)   Pulse 65   Temp 98.1 F (36.7 C) (Oral)   Resp 18   Ht 5\' 2"  (1.575 m)   Wt 51 kg   SpO2 96%   BMI 20.56 kg/m  UX:NATFTDD is gathered by family by phone and emr. Previous records obtained and reviewed. DDX:The patient's complaint of trauma involves an extensive number of diagnostic and treatment options, and is a complaint that carries with it a high risk of complications, morbidity, and potential mortality. Given the large differential diagnosis, medical decision making is of high complexity. The emergent differential diagnosis for trauma is extensive and requires complex medical decision making. The differential includes, but is not limited to traumatic brain injury, Orbital trauma, maxillofacial trauma, skull fracture, blunt/penetrating neck trauma, vertebral artery dissection, whiplash, cervical fracture, neurogenic shock, spinal cord injury, thoracic trauma (blunt/penetrating) cardiac trauma, thoracic and lumbar spine trauma. Abdominal trauma (blunt. Penetrating), genitourinary trauma, extremity fractures, skin  lacerations/ abrasions, vascular injuries.  Labs: I ordered reviewed and interpreted labs which include Bmp with baseline creatinine Cbc with mild macrocytic anemia covid test is negative Pt/inr wnl  Imaging: I ordered and reviewed images which included CT head and C-spine, 1 view chest x-ray, left hip and left knee x-ray. I independently visualized and interpreted all imaging. Significant findings include comminuted intratrochanteric fracture of the left hip. There are no acute, significant findings on the other images. EKG: Consults: Dr. Aline Brochure who will operate on the patient tomorrow MDM: Patient with mechanical fall and left intertrochanteric hip fracture.  I updated the patient's daughter to the best my ability letting her know that she will likely have repair tomorrow morning.  Patient has been stable throughout her ER visit. Patient disposition: Admit The patient appears reasonably stabilized for admission considering the current resources, flow, and capabilities available in the ED at this time, and I doubt any other [facility] requiring further screening and/or treatment in the ED prior to admission.        Final Clinical Impression(s) / ED Diagnoses Final diagnoses:  Closed left hip fracture, initial encounter [facility])    Rx / DC Orders ED Discharge Orders    None  Margarita Mail, PA-C 03/09/20 2054    Milton Ferguson, MD 03/14/20 [phone]

## 2020-03-09 NOTE — ED Triage Notes (Signed)
EMS reports pt slid out of bed and hit left side of head.  Bruising noted to left forehead.  C/O left leg pain, at present, left leg is rotated outward.  EMS started 18g in right ac.  Denies any loss of consciousness.  Pedal pulse present.

## 2020-03-10 ENCOUNTER — Inpatient Hospital Stay (HOSPITAL_COMMUNITY): Payer: Medicare Other

## 2020-03-10 ENCOUNTER — Encounter (HOSPITAL_COMMUNITY): Payer: Self-pay | Admitting: Internal Medicine

## 2020-03-10 ENCOUNTER — Encounter (HOSPITAL_COMMUNITY)
Admission: EM | Disposition: A | Discharge status: Skilled Nursing Facility | Payer: Self-pay | Source: Home / Self Care | Attending: Internal Medicine

## 2020-03-10 ENCOUNTER — Inpatient Hospital Stay (HOSPITAL_COMMUNITY): Payer: Medicare Other | Admitting: Anesthesiology

## 2020-03-10 ENCOUNTER — Other Ambulatory Visit: Payer: Self-pay

## 2020-03-10 DIAGNOSIS — S72002A Fracture of unspecified part of neck of left femur, initial encounter for closed fracture: Secondary | ICD-10-CM

## 2020-03-10 DIAGNOSIS — Z952 Presence of prosthetic heart valve: Secondary | ICD-10-CM

## 2020-03-10 DIAGNOSIS — K31819 Angiodysplasia of stomach and duodenum without bleeding: Secondary | ICD-10-CM

## 2020-03-10 DIAGNOSIS — I480 Paroxysmal atrial fibrillation: Secondary | ICD-10-CM

## 2020-03-10 DIAGNOSIS — I10 Essential (primary) hypertension: Secondary | ICD-10-CM

## 2020-03-10 DIAGNOSIS — S72142A Displaced intertrochanteric fracture of left femur, initial encounter for closed fracture: Principal | ICD-10-CM

## 2020-03-10 DIAGNOSIS — F0390 Unspecified dementia without behavioral disturbance: Secondary | ICD-10-CM

## 2020-03-10 HISTORY — PX: INTRAMEDULLARY (IM) NAIL INTERTROCHANTERIC: SHX5875

## 2020-03-10 LAB — BASIC METABOLIC PANEL
Anion gap: 12 (ref 5–15)
BUN: 23 mg/dL (ref 8–23)
CO2: 25 mmol/L (ref 22–32)
Calcium: 9 mg/dL (ref 8.9–10.3)
Chloride: 97 mmol/L — ABNORMAL LOW (ref 98–111)
Creatinine, Ser: 1.24 mg/dL — ABNORMAL HIGH (ref 0.44–1.00)
GFR calc Af Amer: 44 mL/min — ABNORMAL LOW (ref >60)
GFR calc non Af Amer: 38 mL/min — ABNORMAL LOW (ref >60)
Glucose, Bld: 124 mg/dL — ABNORMAL HIGH (ref 70–99)
Potassium: 4.5 mmol/L (ref 3.5–5.1)
Sodium: 134 mmol/L — ABNORMAL LOW (ref 135–145)

## 2020-03-10 LAB — CBC
HCT: 30.9 % — ABNORMAL LOW (ref 36.0–46.0)
Hemoglobin: 9.5 g/dL — ABNORMAL LOW (ref 12.0–15.0)
MCH: 30.9 pg (ref 26.0–34.0)
MCHC: 30.7 g/dL (ref 30.0–36.0)
MCV: 100.7 fL — ABNORMAL HIGH (ref 80.0–100.0)
Platelets: 190 10*3/uL (ref 150–400)
RBC: 3.07 MIL/uL — ABNORMAL LOW (ref 3.87–5.11)
RDW: 14.7 % (ref 11.5–15.5)
WBC: 7.8 10*3/uL (ref 4.0–10.5)
nRBC: 0 % (ref 0.0–0.2)

## 2020-03-10 SURGERY — FIXATION, FRACTURE, INTERTROCHANTERIC, WITH INTRAMEDULLARY ROD
Anesthesia: General | Site: Hip | Laterality: Left

## 2020-03-10 MED ORDER — PHENOL 1.4 % MT LIQD: 1.0000 | OROMUCOSAL | Status: DC | PRN

## 2020-03-10 MED ORDER — SENNOSIDES-DOCUSATE SODIUM 8.6-50 MG PO TABS: 1.0000 | ORAL_TABLET | Freq: Every evening | ORAL | Status: DC | PRN

## 2020-03-10 MED ORDER — SODIUM CHLORIDE 0.9 % IR SOLN
Status: DC | PRN
  Administered 2020-03-10: 1000 mL

## 2020-03-10 MED ORDER — LACTATED RINGERS IV SOLN: Freq: Once | INTRAVENOUS | Status: AC

## 2020-03-10 MED ORDER — ONDANSETRON HCL 4 MG/2ML IJ SOLN: 4.0000 mg | Freq: Once | INTRAMUSCULAR | Status: DC | PRN

## 2020-03-10 MED ORDER — ORAL CARE MOUTH RINSE: 15.0000 mL | Freq: Once | OROMUCOSAL | Status: AC

## 2020-03-10 MED ORDER — POVIDONE-IODINE 10 % EX SWAB: 2.0000 "application " | Freq: Once | CUTANEOUS | Status: DC

## 2020-03-10 MED ORDER — CEFAZOLIN SODIUM-DEXTROSE 2-4 GM/100ML-% IV SOLN
2.0000 g | Freq: Four times a day (QID) | INTRAVENOUS | Status: AC
  Administered 2020-03-10 – 2020-03-11 (×2): 2 g via INTRAVENOUS
  Filled 2020-03-10 (×2): qty 100

## 2020-03-10 MED ORDER — LACTATED RINGERS IV SOLN: INTRAVENOUS | Status: DC

## 2020-03-10 MED ORDER — PROPOFOL 10 MG/ML IV BOLUS
INTRAVENOUS | Status: AC
  Filled 2020-03-10: qty 40

## 2020-03-10 MED ORDER — PROPOFOL 500 MG/50ML IV EMUL
INTRAVENOUS | Status: DC | PRN
  Administered 2020-03-10: 25 ug/kg/min via INTRAVENOUS

## 2020-03-10 MED ORDER — FENTANYL CITRATE (PF) 100 MCG/2ML IJ SOLN
INTRAMUSCULAR | Status: DC | PRN
  Administered 2020-03-10: 10 ug via INTRATHECAL

## 2020-03-10 MED ORDER — ACETAMINOPHEN 10 MG/ML IV SOLN
1000.0000 mg | Freq: Four times a day (QID) | INTRAVENOUS | Status: AC
  Administered 2020-03-10 – 2020-03-11 (×4): 1000 mg via INTRAVENOUS
  Filled 2020-03-10 (×4): qty 100

## 2020-03-10 MED ORDER — CEFAZOLIN SODIUM-DEXTROSE 2-4 GM/100ML-% IV SOLN
2.0000 g | INTRAVENOUS | Status: AC
  Administered 2020-03-10: 2 g via INTRAVENOUS

## 2020-03-10 MED ORDER — HYDROMORPHONE HCL 1 MG/ML IJ SOLN: 0.2500 mg | INTRAMUSCULAR | Status: DC | PRN

## 2020-03-10 MED ORDER — PROPOFOL 10 MG/ML IV BOLUS
INTRAVENOUS | Status: DC | PRN
  Administered 2020-03-10 (×2): 20 mg via INTRAVENOUS
  Administered 2020-03-10 (×2): 10 mg via INTRAVENOUS

## 2020-03-10 MED ORDER — ONDANSETRON HCL 4 MG PO TABS: 4.0000 mg | ORAL_TABLET | Freq: Four times a day (QID) | ORAL | Status: DC | PRN

## 2020-03-10 MED ORDER — LACTATED RINGERS IV BOLUS
250.0000 mL | Freq: Once | INTRAVENOUS | Status: AC
  Administered 2020-03-10: 250 mL via INTRAVENOUS

## 2020-03-10 MED ORDER — METOCLOPRAMIDE HCL 5 MG/ML IJ SOLN: 5.0000 mg | Freq: Three times a day (TID) | INTRAMUSCULAR | Status: DC | PRN

## 2020-03-10 MED ORDER — ONDANSETRON HCL 4 MG/2ML IJ SOLN: 4.0000 mg | Freq: Four times a day (QID) | INTRAMUSCULAR | Status: DC | PRN

## 2020-03-10 MED ORDER — ASPIRIN EC 81 MG PO TBEC
81.0000 mg | DELAYED_RELEASE_TABLET | Freq: Every day | ORAL | Status: DC
  Administered 2020-03-10 – 2020-03-15 (×6): 81 mg via ORAL
  Filled 2020-03-10 (×6): qty 1

## 2020-03-10 MED ORDER — PHENYLEPHRINE HCL (PRESSORS) 10 MG/ML IV SOLN
INTRAVENOUS | Status: DC | PRN
  Administered 2020-03-10: 40 ug via INTRAVENOUS

## 2020-03-10 MED ORDER — CHLORHEXIDINE GLUCONATE 0.12 % MT SOLN
15.0000 mL | Freq: Once | OROMUCOSAL | Status: AC
  Administered 2020-03-10: 15 mL via OROMUCOSAL

## 2020-03-10 MED ORDER — CHLORHEXIDINE GLUCONATE 4 % EX LIQD: 60.0000 mL | Freq: Once | CUTANEOUS | Status: DC

## 2020-03-10 MED ORDER — SODIUM CHLORIDE 0.9 % IV SOLN: INTRAVENOUS | Status: DC

## 2020-03-10 MED ORDER — BUPIVACAINE HCL (PF) 0.5 % IJ SOLN
INTRAMUSCULAR | Status: AC
  Filled 2020-03-10: qty 30

## 2020-03-10 MED ORDER — METHOCARBAMOL 500 MG PO TABS
500.0000 mg | ORAL_TABLET | Freq: Four times a day (QID) | ORAL | Status: DC | PRN
  Administered 2020-03-10: 500 mg via ORAL
  Filled 2020-03-10 (×2): qty 1

## 2020-03-10 MED ORDER — BUPIVACAINE-EPINEPHRINE (PF) 0.5% -1:200000 IJ SOLN
INTRAMUSCULAR | Status: AC
  Filled 2020-03-10: qty 60

## 2020-03-10 MED ORDER — SODIUM CHLORIDE 0.9 % IV SOLN
INTRAVENOUS | Status: DC | PRN
  Administered 2020-03-10: 15 ug/min via INTRAVENOUS

## 2020-03-10 MED ORDER — BUPIVACAINE HCL (PF) 0.5 % IJ SOLN: INTRAMUSCULAR | Status: DC | PRN

## 2020-03-10 MED ORDER — CHLORHEXIDINE GLUCONATE 0.12 % MT SOLN
OROMUCOSAL | Status: AC
  Filled 2020-03-10: qty 15

## 2020-03-10 MED ORDER — FENTANYL CITRATE (PF) 100 MCG/2ML IJ SOLN
INTRAMUSCULAR | Status: AC
  Filled 2020-03-10: qty 2

## 2020-03-10 MED ORDER — TRAMADOL HCL 50 MG PO TABS
50.0000 mg | ORAL_TABLET | Freq: Four times a day (QID) | ORAL | Status: DC
  Administered 2020-03-10 – 2020-03-15 (×13): 50 mg via ORAL
  Filled 2020-03-10 (×16): qty 1

## 2020-03-10 MED ORDER — METOCLOPRAMIDE HCL 10 MG PO TABS: 5.0000 mg | ORAL_TABLET | Freq: Three times a day (TID) | ORAL | Status: DC | PRN

## 2020-03-10 MED ORDER — METHOCARBAMOL 1000 MG/10ML IJ SOLN
500.0000 mg | Freq: Four times a day (QID) | INTRAVENOUS | Status: DC | PRN
  Filled 2020-03-10: qty 5

## 2020-03-10 MED ORDER — BUPIVACAINE HCL (PF) 0.5 % IJ SOLN
INTRAMUSCULAR | Status: DC | PRN
  Administered 2020-03-10: 1.4 mL via INTRATHECAL

## 2020-03-10 MED ORDER — MENTHOL 3 MG MT LOZG: 1.0000 | LOZENGE | OROMUCOSAL | Status: DC | PRN

## 2020-03-10 MED ORDER — DOCUSATE SODIUM 100 MG PO CAPS
100.0000 mg | ORAL_CAPSULE | Freq: Two times a day (BID) | ORAL | Status: DC
  Administered 2020-03-10 – 2020-03-15 (×9): 100 mg via ORAL
  Filled 2020-03-10 (×10): qty 1

## 2020-03-10 MED ORDER — CEFAZOLIN SODIUM-DEXTROSE 2-4 GM/100ML-% IV SOLN
INTRAVENOUS | Status: AC
  Filled 2020-03-10: qty 100

## 2020-03-10 SURGICAL SUPPLY — 63 items
BENZOIN TINCTURE PRP APPL 2/3 (GAUZE/BANDAGES/DRESSINGS) ×2 IMPLANT
BIT DRILL AO GAMMA 4.2X300 (BIT) ×3 IMPLANT
BLADE SURG SZ10 CARB STEEL (BLADE) ×6 IMPLANT
BNDG GAUZE ELAST 4 BULKY (GAUZE/BANDAGES/DRESSINGS) ×3 IMPLANT
CHLORAPREP W/TINT 26 (MISCELLANEOUS) ×3 IMPLANT
CLOSURE STERI-STRIP 1/2X4 (GAUZE/BANDAGES/DRESSINGS)
CLOSURE WOUND 1/2 X4 (GAUZE/BANDAGES/DRESSINGS) ×1
CLOTH BEACON ORANGE TIMEOUT ST (SAFETY) ×3 IMPLANT
CLSR STERI-STRIP ANTIMIC 1/2X4 (GAUZE/BANDAGES/DRESSINGS) IMPLANT
COVER LIGHT HANDLE STERIS (MISCELLANEOUS) ×6 IMPLANT
COVER MAYO STAND XLG (MISCELLANEOUS) ×3 IMPLANT
COVER WAND RF STERILE (DRAPES) ×3 IMPLANT
DECANTER SPIKE VIAL GLASS SM (MISCELLANEOUS) ×6 IMPLANT
DRAPE STERI IOBAN 125X83 (DRAPES) ×3 IMPLANT
DRSG MEPILEX BORDER 4X12 (GAUZE/BANDAGES/DRESSINGS) ×3 IMPLANT
DRSG MEPILEX SACRM 8.7X9.8 (GAUZE/BANDAGES/DRESSINGS) ×3 IMPLANT
DRSG PAD ABDOMINAL 8X10 ST (GAUZE/BANDAGES/DRESSINGS) ×3 IMPLANT
ELECT REM PT RETURN 9FT ADLT (ELECTROSURGICAL) ×3
ELECTRODE REM PT RTRN 9FT ADLT (ELECTROSURGICAL) ×1 IMPLANT
GLOVE BIO SURGEON STRL SZ7 (GLOVE) ×2 IMPLANT
GLOVE BIOGEL PI IND STRL 7.0 (GLOVE) ×2 IMPLANT
GLOVE BIOGEL PI INDICATOR 7.0 (GLOVE) ×4
GLOVE ECLIPSE 6.5 STRL STRAW (GLOVE) ×2 IMPLANT
GLOVE SKINSENSE NS SZ8.0 LF (GLOVE) ×2
GLOVE SKINSENSE STRL SZ8.0 LF (GLOVE) ×1 IMPLANT
GLOVE SS N UNI LF 8.5 STRL (GLOVE) ×3 IMPLANT
GOWN STRL REUS W/TWL LRG LVL3 (GOWN DISPOSABLE) ×3 IMPLANT
GOWN STRL REUS W/TWL XL LVL3 (GOWN DISPOSABLE) ×3 IMPLANT
GUIDEROD T2 3X1000 (ROD) ×3 IMPLANT
INST SET MAJOR BONE (KITS) ×3 IMPLANT
K-WIRE  3.2X450M STR (WIRE) ×3
K-WIRE 3.2X450M STR (WIRE) ×1
KIT BLADEGUARD II DBL (SET/KITS/TRAYS/PACK) ×3 IMPLANT
KIT TURNOVER CYSTO (KITS) ×3 IMPLANT
KWIRE 3.2X450M STR (WIRE) ×1 IMPLANT
MANIFOLD NEPTUNE II (INSTRUMENTS) ×3 IMPLANT
MARKER SKIN DUAL TIP RULER LAB (MISCELLANEOUS) ×3 IMPLANT
NAIL TROCH GAMMA 11X18 (Nail) ×2 IMPLANT
NDL HYPO 21X1.5 SAFETY (NEEDLE) ×1 IMPLANT
NDL SPNL 18GX3.5 QUINCKE PK (NEEDLE) ×1 IMPLANT
NEEDLE HYPO 21X1.5 SAFETY (NEEDLE) ×3 IMPLANT
NEEDLE SPNL 18GX3.5 QUINCKE PK (NEEDLE) ×3 IMPLANT
NS IRRIG 1000ML POUR BTL (IV SOLUTION) ×3 IMPLANT
PACK BASIC III (CUSTOM PROCEDURE TRAY) ×3
PACK SRG BSC III STRL LF ECLPS (CUSTOM PROCEDURE TRAY) ×1 IMPLANT
PAD ARMBOARD 7.5X6 YLW CONV (MISCELLANEOUS) ×3 IMPLANT
PENCIL SMOKE EVACUATOR COATED (MISCELLANEOUS) ×3 IMPLANT
REAMER SHAFT BIXCUT (INSTRUMENTS) ×2 IMPLANT
SCREW LAG GAMMA 3 TI 10.5X90MM (Screw) ×2 IMPLANT
SCREW LOCKING T2 F/T  5MMX35MM (Screw) ×3 IMPLANT
SCREW LOCKING T2 F/T 5MMX35MM (Screw) IMPLANT
SET BASIN LINEN APH (SET/KITS/TRAYS/PACK) ×3 IMPLANT
SPONGE LAP 18X18 RF (DISPOSABLE) ×6 IMPLANT
STAPLER VISISTAT 35W (STAPLE) IMPLANT
STRIP CLOSURE SKIN 1/2X4 (GAUZE/BANDAGES/DRESSINGS) ×1 IMPLANT
SUT BRALON NAB BRD #1 30IN (SUTURE) ×3 IMPLANT
SUT MNCRL 0 VIOLET CTX 36 (SUTURE) ×1 IMPLANT
SUT MON AB 2-0 CT1 36 (SUTURE) ×3 IMPLANT
SUT MONOCRYL 0 CTX 36 (SUTURE) ×3
SYR 30ML LL (SYRINGE) ×3 IMPLANT
SYR BULB IRRIG 60ML STRL (SYRINGE) ×6 IMPLANT
TRAY FOLEY MTR SLVR 16FR STAT (SET/KITS/TRAYS/PACK) ×3 IMPLANT
YANKAUER SUCT BULB TIP NO VENT (SUCTIONS) ×3 IMPLANT

## 2020-03-10 NOTE — Anesthesia Preprocedure Evaluation (Addendum)
Anesthesia Evaluation  Patient identified by MRN, date of birth, ID band Patient awake    Reviewed: Allergy & Precautions, NPO status , Patient's Chart, lab work & pertinent test results, reviewed documented beta blocker date and time   History of Anesthesia Complications (+) Family history of anesthesia reaction  Airway Mallampati: II  TM Distance: >3 FB Neck ROM: Full    Dental  (+) Upper Dentures, Lower Dentures   Pulmonary shortness of breath and with exertion, asthma , pneumonia, resolved, COPD,    Pulmonary exam normal breath sounds clear to auscultation       Cardiovascular Exercise Tolerance: Poor hypertension, Pt. on medications and Pt. on home beta blockers + Past MI and +CHF  Normal cardiovascular exam+ pacemaker + Valvular Problems/Murmurs (TAVR - 2017)  Rhythm:Regular Rate:Normal  1. Left ventricular ejection fraction, by visual estimation, is 60 to  65%. The left ventricle has normal function. There is mildly increased  left ventricular hypertrophy.  2. Abnormal septal motion consistent with RV pacemaker.  3. Elevated left atrial pressure.  4. Left ventricular diastolic parameters are consistent with Grade II  diastolic dysfunction (pseudonormalization).  5. The left ventricle has no regional wall motion abnormalities.  6. Global right ventricle has normal systolic function.The right  ventricular size is normal. No increase in right ventricular wall  thickness.  7. Left atrial size was mild-moderately dilated.  8. The mitral valve is degenerative. Mild mitral valve regurgitation.  9. The tricuspid valve is grossly normal.  10. 29 mm Edwards S3. Vmax 1.5 m/s, mean gradient 5.3 mmHG, EOA 1.63 cm2,  DI 0.52. No regurgitation or paravalvular leak.  11. Mildly elevated pulmonary artery systolic pressure.  12. The tricuspid regurgitant velocity is 3.01 m/s, and with an assumed  right atrial pressure of 3 mmHg,  the estimated right ventricular systolic  pressure is mildly elevated at 39.3 mmHg.  13. The inferior vena cava is normal in size with greater than 50%  respiratory variability, suggesting right atrial pressure of 3 mmHg.    Neuro/Psych PSYCHIATRIC DISORDERS Dementia CVA    GI/Hepatic GERD  Medicated and Controlled,  Endo/Other    Renal/GU Renal InsufficiencyRenal disease (AKI)     Musculoskeletal  (+) Arthritis ,   Abdominal   Peds  Hematology  (+) anemia ,   Anesthesia Other Findings 29-Feb-2020 13:12:06 [facility]-ER ROUTINE RECORD Sinus rhythm Short PR interval Nonspecific IVCD with LAD Left ventricular hypertrophy Abnormal T, consider ischemia, lateral leads  Reproductive/Obstetrics                           Anesthesia Physical Anesthesia Plan  ASA: IV  Anesthesia Plan: Spinal and General   Post-op Pain Management:    Induction: Intravenous  PONV Risk Score and Plan: Ondansetron and Treatment may vary due to age or medical condition  Airway Management Planned:   Additional Equipment:   Intra-op Plan:   Post-operative Plan:   Informed Consent: I have reviewed the patients History and Physical, chart, labs and discussed the procedure including the risks, benefits and alternatives for the proposed anesthesia with the patient or authorized representative who has indicated his/her understanding and acceptance.       Plan Discussed with: CRNA and Surgeon  Anesthesia Plan Comments:        Anesthesia Quick Evaluation

## 2020-03-10 NOTE — H&P (View-Only) (Signed)
Reason for Consult: Fracture left hip Referring Physician: Barton Dubois MD  Crystal Cooper is an 86 y.o. female.  HPI: 86 year old female fell and fractured her left hip admitted to the hospital and consultation obtained for treatment recommendations.  Patient has a fairly significant cardiac history but I discussed with anesthesia and they think we can proceed.  Her medical problems are listed below  She complains of severe pain left hip it is nonradiating it is increased with movement and her injury was on 15 September.  Past Medical History:  Diagnosis Date  . AKI (acute kidney injury) ([place])   . Anemia    years ago  . Aortic stenosis   . Arthritis   . Asthma   . Atrial fibrillation ([place])   . CHF (congestive heart failure) ([place]) 11/2014  . CKD (chronic kidney disease)   . Dementia ([place])   . Family history of adverse reaction to anesthesia    2 daughters would have N/V  . Gastric AVM   . GI bleed   . Glaucoma   . Hearing loss   . Heart murmur   . HTN (hypertension)   . Hypokalemia   . Pneumonia   . Prolapsing mitral leaflet syndrome   . Shortness of breath dyspnea    with exertion  . Stroke ([place])   . SVT (supraventricular tachycardia) ([facility])    S/P ablation of AVNRT in 2003    Past Surgical History:  Procedure Laterality Date  . APPENDECTOMY    . BIOPSY  04/07/2018   Procedure: BIOPSY;  Surgeon: Jerene Bears, MD;  Location: [facility] ENDOSCOPY;  Service: Gastroenterology;;  . BIOPSY  02/19/2020   Procedure: BIOPSY;  Surgeon: Harvel Quale, MD;  [facility] ENDO SU[facility];  Service: Gastroenterology;;  gastric   . BLADDER SURGERY    . CARDIAC CATHETERIZATION N/A 09/26/2015   Procedure: Right/Left Heart Cath and Coronary Angiography;  Surgeon: Burnell Blanks, MD;  Location: [place] CV LAB;  Service: Cardiovascular;  Laterality: N/A;  . CARDIAC SURGERY    . CARDIOVERSION N/A 03/02/2016   Procedure: CARDIOVERSION;  Surgeon: Thayer Headings, MD;  Location: [facility]  ENDOSCOPY;  Service: Cardiovascular;  Laterality: N/A;  . ENTEROSCOPY  02/19/2020   Procedure: ENTEROSCOPY;  Surgeon: Harvel Quale, MD;  [facility] ENDO [facility];  Service: Gastroenterology;;  . EP IMPLANTABLE DEVICE N/A 10/26/2015   Procedure: Pacemaker Implant;  Surgeon: Will Meredith Leeds, MD;  Location: [place] CV LAB;  Service: Cardiovascular;  Laterality: N/A;  . ESOPHAGOGASTRODUODENOSCOPY (EGD) WITH PROPOFOL N/A 04/07/2018   Procedure: ESOPHAGOGASTRODUODENOSCOPY (EGD) WITH PROPOFOL;  Surgeon: Jerene Bears, MD;  Location: [facility] ENDOSCOPY;  Service: Gastroenterology;  Laterality: N/A;  . ESOPHAGOGASTRODUODENOSCOPY (EGD) WITH PROPOFOL N/A 10/12/2019   Procedure: ESOPHAGOGASTRODUODENOSCOPY (EGD) WITH PROPOFOL;  Surgeon: Danie Binder, MD;  [facility];  Service: Endoscopy;  Laterality: N/A;  . EYE SURGERY Bilateral    cataract surgery  . FLEXIBLE SIGMOIDOSCOPY  02/19/2020   Procedure: FLEXIBLE SIGMOIDOSCOPY;  Surgeon: Montez Morita, Quillian Quince, MD;  [facility];  Service: Gastroenterology;;  . HOT HEMOSTASIS N/A 04/07/2018   Procedure: HOT HEMOSTASIS (ARGON PLASMA COAGULATION/BICAP);  Surgeon: Jerene Bears, MD;  Location: [facility] ENDOSCOPY;  Service: Gastroenterology;  Laterality: N/A;  . HOT HEMOSTASIS  10/12/2019   Procedure: HOT HEMOSTASIS (ARGON PLASMA COAGULATION/BICAP);  Surgeon: Danie Binder, MD;  [facility];  Service: Endoscopy;;  . NASAL SINUS SURGERY    . PACEMAKER INSERTION    .  TEE WITHOUT CARDIOVERSION N/A 10/25/2015   Procedure: TRANSESOPHAGEAL ECHOCARDIOGRAM (TEE);  Surgeon: Burnell Blanks, MD;  Location: [place];  Service: Open Heart Surgery;  Laterality: N/A;  . TRANSCATHETER AORTIC VALVE REPLACEMENT, TRANSFEMORAL Right 10/25/2015   Procedure: TRANSCATHETER AORTIC VALVE REPLACEMENT, TRANSFEMORAL;  Surgeon: Burnell Blanks, MD;  Location: [place];  Service: Open Heart Surgery;  Laterality: Right;    Family History   Problem Relation Age of Onset  . Hypertension Mother   . Pneumonia Father   . Stomach cancer Brother   . Stroke Brother   . AAA (abdominal aortic aneurysm) Brother   . Cervical cancer Daughter   . Heart attack Neg Hx   . Colon cancer Neg Hx   . Esophageal cancer Neg Hx     Social History:  reports that she has never smoked. She has never used smokeless tobacco. She reports that she does not drink alcohol and does not use drugs.  Allergies:  Allergies  Allergen Reactions  . Hctz [Hydrochlorothiazide] Other (See Comments)    Pt was ill and this affected her kidneys   . Aspirin Other (See Comments)    Cardiologist said the patient is to not take this  . Codeine Other (See Comments)    Made the patient feel ill, has not had any problems since 1977    Medications: { Meds ordered this encounter  Medications  . 0.9 %  sodium chloride infusion  . fentaNYL (SUBLIMAZE) injection 50 mcg  . ondansetron (ZOFRAN) injection 4 mg  . DISCONTD: furosemide (LASIX) tablet 80 mg  . metoprolol tartrate (LOPRESSOR) tablet 25 mg  . memantine (NAMENDA) tablet 10 mg  . levothyroxine (SYNTHROID) tablet 25 mcg  . famotidine (PEPCID) tablet 20 mg  . pantoprazole (PROTONIX) EC tablet 40 mg  . ferrous sulfate tablet 325 mg  . DISCONTD: Biotin CAPS 10 mg  . DISCONTD: Calcium Carb-Cholecalciferol 600-800 MG-UNIT TABS 1 tablet  . multivitamin with minerals tablet 1 tablet  . potassium chloride SA (KLOR-CON) CR tablet 20 mEq  . albuterol (VENTOLIN HFA) 108 (90 Base) MCG/ACT inhaler 2 puff  . DISCONTD: Budeson-Glycopyrrol-Formoterol 160-9-4.8 MCG/ACT AERO 2 Inhaler  . HYDROcodone-acetaminophen (NORCO/VICODIN) 5-325 MG per tablet 1-2 tablet  . morphine 2 MG/ML injection 0.5 mg  . OR Linked Order Group   . methocarbamol (ROBAXIN) tablet 500 mg   . methocarbamol (ROBAXIN) 500 mg in dextrose 5 % 50 mL IVPB  . docusate sodium (COLACE) capsule 100 mg  . potassium chloride SA (KLOR-CON) CR tablet 40 mEq   . furosemide (LASIX) tablet 80 mg  . AND Linked Order Group   . calcium carbonate (OS-CAL - dosed in mg of elemental calcium) tablet 500 mg of elemental calcium   . cholecalciferol (VITAMIN D3) tablet 800 Units  . chlorhexidine (HIBICLENS) 4 % liquid 4 application  . povidone-iodine 10 % swab 2 application  . ceFAZolin (ANCEF) IVPB 2g/100 mL premix    Order Specific Question:   Indication:    Answer:   Surgical Prophylaxis  . AND Linked Order Group   . umeclidinium bromide (INCRUSE ELLIPTA) 62.5 MCG/INH 1 puff   . arformoterol (BROVANA) nebulizer solution 15 mcg  . budesonide (PULMICORT) nebulizer solution 0.25 mg  . lactated ringers infusion  . OR Linked Order Group   . chlorhexidine (PERIDEX) 0.12 % solution 15 mL   . MEDLINE mouth rinse  . lactated ringers infusion  . lactated ringers bolus 250 mL     Results for orders placed or performed  during the hospital encounter of 03/09/20 (from the past 48 hour(s))  Basic metabolic panel     Status: Abnormal   Collection Time: 03/09/20  1:53 PM  Result Value Ref Range   Sodium 135 135 - 145 mmol/L   Potassium 3.4 (L) 3.5 - 5.1 mmol/L   Chloride 93 (L) 98 - 111 mmol/L   CO2 30 22 - 32 mmol/L   Glucose, Bld 137 (H) 70 - 99 mg/dL    Comment: Glucose reference range applies only to samples taken after fasting for at least 8 hours.   BUN 22 8 - 23 mg/dL   Creatinine, Ser 1.29 (H) 0.44 - 1.00 mg/dL   Calcium 9.0 8.9 - 10.3 mg/dL   GFR calc non Af Amer 36 (L) >60 mL/min   GFR calc Af Amer 42 (L) >60 mL/min   Anion gap 12 5 - 15    Comment: Performed at [facility], [address], [place], [place] [address]  CBC WITH DIFFERENTIAL     Status: Abnormal   Collection Time: 03/09/20  1:53 PM  Result Value Ref Range   WBC 6.0 4.0 - 10.5 K/uL   RBC 3.46 (L) 3.87 - 5.11 MIL/uL   Hemoglobin 10.7 (L) 12.0 - 15.0 g/dL   HCT 34.8 (L) 36 - 46 %   MCV 100.6 (H) 80.0 - 100.0 fL   MCH 30.9 26.0 - 34.0 pg   MCHC 30.7 30.0 - 36.0 g/dL   RDW 14.7  11.5 - 15.5 %   Platelets 200 150 - 400 K/uL   nRBC 0.0 0.0 - 0.2 %   Neutrophils Relative % 81 %   Neutro Abs 4.9 1.7 - 7.7 K/uL   Lymphocytes Relative 9 %   Lymphs Abs 0.5 (L) 0.7 - 4.0 K/uL   Monocytes Relative 8 %   Monocytes Absolute 0.5 0 - 1 K/uL   Eosinophils Relative 1 %   Eosinophils Absolute 0.0 0 - 0 K/uL   Basophils Relative 1 %   Basophils Absolute 0.1 0 - 0 K/uL   Immature Granulocytes 0 %   Abs Immature Granulocytes 0.02 0.00 - 0.07 K/uL    Comment: Performed at [facility], [address] [place], [place] [address]  Protime-INR     Status: None   Collection Time: 03/09/20  1:53 PM  Result Value Ref Range   Prothrombin Time 13.8 11.4 - 15.2 seconds   INR 1.1 0.8 - 1.2    Comment: (NOTE) INR goal varies based on device and disease states. Performed at [facility], [address], [place], [place] [address]   Type and screen [facility]     Status: None   Collection Time: 03/09/20  1:53 PM  Result Value Ref Range   ABO/RH(D) A POS    Antibody Screen NEG    Sample Expiration      03/12/2020,2359 Performed at [facility], [address] [place],  [address]   SARS Coronavirus 2 by RT PCR (hospital order, performed in [facility] hospital lab) Nasopharyngeal Nasopharyngeal Swab     Status: None   Collection Time: 03/09/20  4:55 PM   Specimen: Nasopharyngeal Swab  Result Value Ref Range   SARS Coronavirus 2 NEGATIVE NEGATIVE    Comment: (NOTE) SARS-CoV-2 target nucleic acids are NOT DETECTED.  The SARS-CoV-2 RNA is generally detectable in upper and lower respiratory specimens during the acute phase of infection. The lowest concentration of SARS-CoV-2 viral copies this assay can detect is 250 copies /  mL. A negative result does not preclude SARS-CoV-2 infection and should not be used as the sole basis for treatment or other patient management decisions.  A negative result may occur with improper specimen collection / handling, submission of  specimen other than nasopharyngeal swab, presence of viral mutation(s) within the areas targeted by this assay, and inadequate number of viral copies (<250 copies / mL). A negative result must be combined with clinical observations, patient history, and epidemiological information.  Fact Sheet for Patients:   StrictlyIdeas.no  Fact Sheet for Healthcare Providers: BankingDealers.co.za  This test is not yet approved or  cleared by the [place] FDA and has been authorized for detection and/or diagnosis of SARS-CoV-2 by FDA under an Emergency Use Authorization (EUA).  This EUA will remain in effect (meaning this test can be used) for the duration of the COVID-19 declaration under Section 564(b)(1) of the Act, 21 U.S.C. section 360bbb-3(b)(1), unless the authorization is terminated or revoked sooner.  Performed at [facility], [address], [place], [place] [address]   CBC     Status: Abnormal   Collection Time: 03/10/20  6:02 AM  Result Value Ref Range   WBC 7.8 4.0 - 10.5 K/uL   RBC 3.07 (L) 3.87 - 5.11 MIL/uL   Hemoglobin 9.5 (L) 12.0 - 15.0 g/dL   HCT 30.9 (L) 36 - 46 %   MCV 100.7 (H) 80.0 - 100.0 fL   MCH 30.9 26.0 - 34.0 pg   MCHC 30.7 30.0 - 36.0 g/dL   RDW 14.7 11.5 - 15.5 %   Platelets 190 150 - 400 K/uL   nRBC 0.0 0.0 - 0.2 %    Comment: Performed at [facility], [address] [place], [place] [address]  Basic metabolic panel     Status: Abnormal   Collection Time: 03/10/20  6:02 AM  Result Value Ref Range   Sodium 134 (L) 135 - 145 mmol/L   Potassium 4.5 3.5 - 5.1 mmol/L    Comment: DELTA CHECK NOTED   Chloride 97 (L) 98 - 111 mmol/L   CO2 25 22 - 32 mmol/L   Glucose, Bld 124 (H) 70 - 99 mg/dL    Comment: Glucose reference range applies only to samples taken after fasting for at least 8 hours.   BUN 23 8 - 23 mg/dL   Creatinine, Ser 1.24 (H) 0.44 - 1.00 mg/dL   Calcium 9.0 8.9 - 10.3 mg/dL   GFR calc non Af  Amer 38 (L) >60 mL/min   GFR calc Af Amer 44 (L) >60 mL/min   Anion gap 12 5 - 15    Comment: Performed at [facility], [address] [place], [place] [address]    DG Chest 1 View  Result Date: 03/09/2020 CLINICAL DATA:  Fall. EXAM: CHEST  1 VIEW COMPARISON:  February 29, 2020. FINDINGS: Stable cardiomegaly. Status post transcatheter aortic valve repair. Left-sided pacemaker is unchanged in position. No pneumothorax or pleural effusion is noted. Both lungs are clear. The visualized skeletal structures are unremarkable. IMPRESSION: No active disease. Aortic Atherosclerosis (ICD10-I70.0). Electronically Signed   By: Marijo Conception M.D.   On: 03/09/2020 14:37   CT HEAD WO CONTRAST  Result Date: 03/09/2020 CLINICAL DATA:  Head trauma, minor. Poly trauma, critical, head/cervical spine injury suspected. Additional history obtained from [place] slid out of bed and hit left side of head, bruising noted to left side of forehead, patient reports left leg pain. EXAM: CT HEAD WITHOUT CONTRAST  CT CERVICAL SPINE WITHOUT CONTRAST TECHNIQUE: Multidetector CT imaging of the head and cervical spine was performed following the standard protocol without intravenous contrast. Multiplanar CT image reconstructions of the cervical spine were also generated. COMPARISON:  Brain MRI 12/02/2019.  Head CT 08/25/2019. FINDINGS: CT HEAD FINDINGS Brain: Stable, moderate generalized parenchymal atrophy. Known chronic lacunar infarcts within the right corona radiata, basal ganglia, thalami and bilateral cerebellar hemispheres, some of which were better appreciated on the MRI of 12/02/2019. There is no acute intracranial hemorrhage. No demarcated cortical infarct. No extra-axial fluid collection. No evidence of intracranial mass. No midline shift. Vascular: No hyperdense vessel.  Atherosclerotic calcifications Skull: Normal. Negative for fracture or focal lesion. Sinuses/Orbits: Visualized orbits show no  acute finding. Scattered mild mucosal thickening and frothy secretions within the ethmoid air cells. No significant mastoid effusion. Other: Mild left frontal scalp/forehead soft tissue swelling. CT CERVICAL SPINE FINDINGS Mildly motion degraded examination Alignment: Cervical levocurvature. Straightening of the expected cervical lordosis. No significant spondylolisthesis. Skull base and vertebrae: The basion-dental and atlanto-dental intervals are maintained.No evidence of acute fracture to the cervical spine. Soft tissues and spinal canal: No prevertebral fluid or swelling. No visible canal hematoma. Disc levels: Cervical spondylosis with multilevel disc space narrowing, posterior disc osteophytes, uncovertebral and facet hypertrophy. A prominent C5-C6 posterior disc osteophyte complex contributes to severe bony spinal canal stenosis Upper chest: No consolidation within the imaged lung apices. No visible pneumothorax. IMPRESSION: CT head: 1. No evidence of acute intracranial abnormality. 2. Mild left frontal scalp/forehead soft tissue swelling. 3. Stable moderate generalized parenchymal atrophy and advanced chronic small vessel ischemic disease with multiple chronic lacunar infarcts as described. 4. Ethmoid sinusitis. CT cervical spine: 1. Mildly motion degraded examination. 2. No evidence of acute fracture to the cervical spine. 3. Cervical spondylosis as described. Most notably, a prominent C5-C6 posterior disc osteophyte complex contributes to severe spinal canal stenosis at this level, likely with some degree of spinal cord mass effect. Consider cervical spine MRI for further evaluation, as clinically warranted. Electronically Signed   By: Kellie Simmering DO   On: 03/09/2020 14:35   CT CERVICAL SPINE WO CONTRAST  Result Date: 03/09/2020 CLINICAL DATA:  Head trauma, minor. Poly trauma, critical, head/cervical spine injury suspected. Additional history obtained from [place] slid out  of bed and hit left side of head, bruising noted to left side of forehead, patient reports left leg pain. EXAM: CT HEAD WITHOUT CONTRAST CT CERVICAL SPINE WITHOUT CONTRAST TECHNIQUE: Multidetector CT imaging of the head and cervical spine was performed following the standard protocol without intravenous contrast. Multiplanar CT image reconstructions of the cervical spine were also generated. COMPARISON:  Brain MRI 12/02/2019.  Head CT 08/25/2019. FINDINGS: CT HEAD FINDINGS Brain: Stable, moderate generalized parenchymal atrophy. Known chronic lacunar infarcts within the right corona radiata, basal ganglia, thalami and bilateral cerebellar hemispheres, some of which were better appreciated on the MRI of 12/02/2019. There is no acute intracranial hemorrhage. No demarcated cortical infarct. No extra-axial fluid collection. No evidence of intracranial mass. No midline shift. Vascular: No hyperdense vessel.  Atherosclerotic calcifications Skull: Normal. Negative for fracture or focal lesion. Sinuses/Orbits: Visualized orbits show no acute finding. Scattered mild mucosal thickening and frothy secretions within the ethmoid air cells. No significant mastoid effusion. Other: Mild left frontal scalp/forehead soft tissue swelling. CT CERVICAL SPINE FINDINGS Mildly motion degraded examination Alignment: Cervical levocurvature. Straightening of the expected cervical lordosis. No significant spondylolisthesis. Skull base and vertebrae: The basion-dental and atlanto-dental intervals are maintained.No  evidence of acute fracture to the cervical spine. Soft tissues and spinal canal: No prevertebral fluid or swelling. No visible canal hematoma. Disc levels: Cervical spondylosis with multilevel disc space narrowing, posterior disc osteophytes, uncovertebral and facet hypertrophy. A prominent C5-C6 posterior disc osteophyte complex contributes to severe bony spinal canal stenosis Upper chest: No consolidation within the imaged lung  apices. No visible pneumothorax. IMPRESSION: CT head: 1. No evidence of acute intracranial abnormality. 2. Mild left frontal scalp/forehead soft tissue swelling. 3. Stable moderate generalized parenchymal atrophy and advanced chronic small vessel ischemic disease with multiple chronic lacunar infarcts as described. 4. Ethmoid sinusitis. CT cervical spine: 1. Mildly motion degraded examination. 2. No evidence of acute fracture to the cervical spine. 3. Cervical spondylosis as described. Most notably, a prominent C5-C6 posterior disc osteophyte complex contributes to severe spinal canal stenosis at this level, likely with some degree of spinal cord mass effect. Consider cervical spine MRI for further evaluation, as clinically warranted. Electronically Signed   By: Kellie Simmering DO   On: 03/09/2020 14:35   DG Knee Complete 4 Views Left  Result Date: 03/09/2020 CLINICAL DATA:  Left knee pain after fall. EXAM: LEFT KNEE - COMPLETE 4+ VIEW COMPARISON:  None. FINDINGS: No evidence of fracture, dislocation, or joint effusion. Moderate narrowing of the medial and lateral joint spaces are noted. Moderate patellar spurring is noted. Vascular calcifications are noted. IMPRESSION: Moderate degenerative joint disease. No acute abnormality seen in the left knee. Electronically Signed   By: Marijo Conception M.D.   On: 03/09/2020 14:40   DG Hip Unilat With Pelvis 2-3 Views Left  Result Date: 03/09/2020 CLINICAL DATA:  Left hip pain after fall. EXAM: DG HIP (WITH OR WITHOUT PELVIS) 2-3V LEFT COMPARISON:  None. FINDINGS: Moderately angulated and probably comminuted fracture is seen involving the intertrochanteric region of the proximal left femur. Right hip is unremarkable. IMPRESSION: Moderately angulated and probably comminuted intertrochanteric fracture of proximal left femur. Electronically Signed   By: Marijo Conception M.D.   On: 03/09/2020 14:38    Review of Systems  Unable to perform ROS: Acuity of condition   Blood  pressure (!) 166/77, pulse 60, temperature 98.4 F (36.9 C), temperature source Oral, resp. rate 18, height 5\' 2"  (1.575 m), weight 51 kg, SpO2 96 %. Physical Exam General appearance the patient has a small frame appears to be fully developed and adequate nutritional status  Cardiovascular exam no swelling or varicosities pulses are intact temperature of extremities are normal without edema or tenderness  Lymph nodes are negative in the groin  Patient is unable to ambulate secondary to hip fracture  Skin normal x4 extremities  Neuro could not test finger-to-nose or heel-to-shin based on acuity coordination could not be tested upper extremity reflexes are normal sensation distally is normal she is alert and oriented x3 without depression anxiety or agitation  Upper extremities show no malalignment no tenderness normal range of motion no instability normal muscle tone  Right lower extremity nontender normal alignment full range of motion no instability normal muscle tone  Left lower extremity is externally rotated is tender in the proximal thigh range of motion deferred because of pain no stability tests were done because of pain and muscle tone was normal without tremor   Assessment/Plan: 86 yo major medical problems high risk patient with displcaed intertrochanteric left hip fx.   OTIF LEFT HIP WITH GAMMA NAIL    Arther Abbott 03/10/2020, 7:36 AM

## 2020-03-10 NOTE — Consult Note (Signed)
Reason for Consult: Fracture left hip Referring Physician: Barton Dubois MD  Crystal Cooper is an 86 y.o. female.  HPI: 86 year old female fell and fractured her left hip admitted to the hospital and consultation obtained for treatment recommendations.  Patient has a fairly significant cardiac history but I discussed with anesthesia and they think we can proceed.  Her medical problems are listed below  She complains of severe pain left hip it is nonradiating it is increased with movement and her injury was on 15 September.  Past Medical History:  Diagnosis Date  . AKI (acute kidney injury) ([place])   . Anemia    years ago  . Aortic stenosis   . Arthritis   . Asthma   . Atrial fibrillation ([place])   . CHF (congestive heart failure) ([place]) 11/2014  . CKD (chronic kidney disease)   . Dementia ([place])   . Family history of adverse reaction to anesthesia    2 daughters would have N/V  . Gastric AVM   . GI bleed   . Glaucoma   . Hearing loss   . Heart murmur   . HTN (hypertension)   . Hypokalemia   . Pneumonia   . Prolapsing mitral leaflet syndrome   . Shortness of breath dyspnea    with exertion  . Stroke ([place])   . SVT (supraventricular tachycardia) ([facility])    S/P ablation of AVNRT in 2003    Past Surgical History:  Procedure Laterality Date  . APPENDECTOMY    . BIOPSY  04/07/2018   Procedure: BIOPSY;  Surgeon: Jerene Bears, MD;  Location: [facility] ENDOSCOPY;  Service: Gastroenterology;;  . BIOPSY  02/19/2020   Procedure: BIOPSY;  Surgeon: Harvel Quale, MD;  [facility];  Service: Gastroenterology;;  gastric   . BLADDER SURGERY    . CARDIAC CATHETERIZATION N/A 09/26/2015   Procedure: Right/Left Heart Cath and Coronary Angiography;  Surgeon: Burnell Blanks, MD;  Location: [place] CV LAB;  Service: Cardiovascular;  Laterality: N/A;  . CARDIAC SURGERY    . CARDIOVERSION N/A 03/02/2016   Procedure: CARDIOVERSION;  Surgeon: Thayer Headings, MD;  Location: [facility]  ENDOSCOPY;  Service: Cardiovascular;  Laterality: N/A;  . ENTEROSCOPY  02/19/2020   Procedure: ENTEROSCOPY;  Surgeon: Harvel Quale, MD;  [facility];  Service: Gastroenterology;;  . EP IMPLANTABLE DEVICE N/A 10/26/2015   Procedure: Pacemaker Implant;  Surgeon: Will Meredith Leeds, MD;  Location: [place] CV LAB;  Service: Cardiovascular;  Laterality: N/A;  . ESOPHAGOGASTRODUODENOSCOPY (EGD) WITH PROPOFOL N/A 04/07/2018   Procedure: ESOPHAGOGASTRODUODENOSCOPY (EGD) WITH PROPOFOL;  Surgeon: Jerene Bears, MD;  Location: [facility] ENDOSCOPY;  Service: Gastroenterology;  Laterality: N/A;  . ESOPHAGOGASTRODUODENOSCOPY (EGD) WITH PROPOFOL N/A 10/12/2019   Procedure: ESOPHAGOGASTRODUODENOSCOPY (EGD) WITH PROPOFOL;  Surgeon: Danie Binder, MD;  [facility];  Service: Endoscopy;  Laterality: N/A;  . EYE SURGERY Bilateral    cataract surgery  . FLEXIBLE SIGMOIDOSCOPY  02/19/2020   Procedure: FLEXIBLE SIGMOIDOSCOPY;  Surgeon: Montez Morita, Quillian Quince, MD;  [facility];  Service: Gastroenterology;;  . HOT HEMOSTASIS N/A 04/07/2018   Procedure: HOT HEMOSTASIS (ARGON PLASMA COAGULATION/BICAP);  Surgeon: Jerene Bears, MD;  Location: [facility] ENDOSCOPY;  Service: Gastroenterology;  Laterality: N/A;  . HOT HEMOSTASIS  10/12/2019   Procedure: HOT HEMOSTASIS (ARGON PLASMA COAGULATION/BICAP);  Surgeon: Danie Binder, MD;  [facility];  Service: Endoscopy;;  . NASAL SINUS SURGERY    . PACEMAKER INSERTION    .  TEE WITHOUT CARDIOVERSION N/A 10/25/2015   Procedure: TRANSESOPHAGEAL ECHOCARDIOGRAM (TEE);  Surgeon: Burnell Blanks, MD;  Location: [place];  Service: Open Heart Surgery;  Laterality: N/A;  . TRANSCATHETER AORTIC VALVE REPLACEMENT, TRANSFEMORAL Right 10/25/2015   Procedure: TRANSCATHETER AORTIC VALVE REPLACEMENT, TRANSFEMORAL;  Surgeon: Burnell Blanks, MD;  Location: [place];  Service: Open Heart Surgery;  Laterality: Right;    Family History   Problem Relation Age of Onset  . Hypertension Mother   . Pneumonia Father   . Stomach cancer Brother   . Stroke Brother   . AAA (abdominal aortic aneurysm) Brother   . Cervical cancer Daughter   . Heart attack Neg Hx   . Colon cancer Neg Hx   . Esophageal cancer Neg Hx     Social History:  reports that she has never smoked. She has never used smokeless tobacco. She reports that she does not drink alcohol and does not use drugs.  Allergies:  Allergies  Allergen Reactions  . Hctz [Hydrochlorothiazide] Other (See Comments)    Pt was ill and this affected her kidneys   . Aspirin Other (See Comments)    Cardiologist said the patient is to not take this  . Codeine Other (See Comments)    Made the patient feel ill, has not had any problems since 1977    Medications: { Meds ordered this encounter  Medications  . 0.9 %  sodium chloride infusion  . fentaNYL (SUBLIMAZE) injection 50 mcg  . ondansetron (ZOFRAN) injection 4 mg  . DISCONTD: furosemide (LASIX) tablet 80 mg  . metoprolol tartrate (LOPRESSOR) tablet 25 mg  . memantine (NAMENDA) tablet 10 mg  . levothyroxine (SYNTHROID) tablet 25 mcg  . famotidine (PEPCID) tablet 20 mg  . pantoprazole (PROTONIX) EC tablet 40 mg  . ferrous sulfate tablet 325 mg  . DISCONTD: Biotin CAPS 10 mg  . DISCONTD: Calcium Carb-Cholecalciferol 600-800 MG-UNIT TABS 1 tablet  . multivitamin with minerals tablet 1 tablet  . potassium chloride SA (KLOR-CON) CR tablet 20 mEq  . albuterol (VENTOLIN HFA) 108 (90 Base) MCG/ACT inhaler 2 puff  . DISCONTD: Budeson-Glycopyrrol-Formoterol 160-9-4.8 MCG/ACT AERO 2 Inhaler  . HYDROcodone-acetaminophen (NORCO/VICODIN) 5-325 MG per tablet 1-2 tablet  . morphine 2 MG/ML injection 0.5 mg  . OR Linked Order Group   . methocarbamol (ROBAXIN) tablet 500 mg   . methocarbamol (ROBAXIN) 500 mg in dextrose 5 % 50 mL IVPB  . docusate sodium (COLACE) capsule 100 mg  . potassium chloride SA (KLOR-CON) CR tablet 40 mEq   . furosemide (LASIX) tablet 80 mg  . AND Linked Order Group   . calcium carbonate (OS-CAL - dosed in mg of elemental calcium) tablet 500 mg of elemental calcium   . cholecalciferol (VITAMIN D3) tablet 800 Units  . chlorhexidine (HIBICLENS) 4 % liquid 4 application  . povidone-iodine 10 % swab 2 application  . ceFAZolin (ANCEF) IVPB 2g/100 mL premix    Order Specific Question:   Indication:    Answer:   Surgical Prophylaxis  . AND Linked Order Group   . umeclidinium bromide (INCRUSE ELLIPTA) 62.5 MCG/INH 1 puff   . arformoterol (BROVANA) nebulizer solution 15 mcg  . budesonide (PULMICORT) nebulizer solution 0.25 mg  . lactated ringers infusion  . OR Linked Order Group   . chlorhexidine (PERIDEX) 0.12 % solution 15 mL   . MEDLINE mouth rinse  . lactated ringers infusion  . lactated ringers bolus 250 mL     Results for orders placed or performed  during the hospital encounter of 03/09/20 (from the past 48 hour(s))  Basic metabolic panel     Status: Abnormal   Collection Time: 03/09/20  1:53 PM  Result Value Ref Range   Sodium 135 135 - 145 mmol/L   Potassium 3.4 (L) 3.5 - 5.1 mmol/L   Chloride 93 (L) 98 - 111 mmol/L   CO2 30 22 - 32 mmol/L   Glucose, Bld 137 (H) 70 - 99 mg/dL    Comment: Glucose reference range applies only to samples taken after fasting for at least 8 hours.   BUN 22 8 - 23 mg/dL   Creatinine, Ser 1.29 (H) 0.44 - 1.00 mg/dL   Calcium 9.0 8.9 - 10.3 mg/dL   GFR calc non Af Amer 36 (L) >60 mL/min   GFR calc Af Amer 42 (L) >60 mL/min   Anion gap 12 5 - 15    Comment: Performed at [facility], [address], [place], [place] [address]  CBC WITH DIFFERENTIAL     Status: Abnormal   Collection Time: 03/09/20  1:53 PM  Result Value Ref Range   WBC 6.0 4.0 - 10.5 K/uL   RBC 3.46 (L) 3.87 - 5.11 MIL/uL   Hemoglobin 10.7 (L) 12.0 - 15.0 g/dL   HCT 34.8 (L) 36 - 46 %   MCV 100.6 (H) 80.0 - 100.0 fL   MCH 30.9 26.0 - 34.0 pg   MCHC 30.7 30.0 - 36.0 g/dL   RDW 14.7  11.5 - 15.5 %   Platelets 200 150 - 400 K/uL   nRBC 0.0 0.0 - 0.2 %   Neutrophils Relative % 81 %   Neutro Abs 4.9 1.7 - 7.7 K/uL   Lymphocytes Relative 9 %   Lymphs Abs 0.5 (L) 0.7 - 4.0 K/uL   Monocytes Relative 8 %   Monocytes Absolute 0.5 0 - 1 K/uL   Eosinophils Relative 1 %   Eosinophils Absolute 0.0 0 - 0 K/uL   Basophils Relative 1 %   Basophils Absolute 0.1 0 - 0 K/uL   Immature Granulocytes 0 %   Abs Immature Granulocytes 0.02 0.00 - 0.07 K/uL    Comment: Performed at [facility], [address], [place], [place] [address]  Protime-INR     Status: None   Collection Time: 03/09/20  1:53 PM  Result Value Ref Range   Prothrombin Time 13.8 11.4 - 15.2 seconds   INR 1.1 0.8 - 1.2    Comment: (NOTE) INR goal varies based on device and disease states. Performed at [facility], [address] [place], [place] [address]   Type and screen [facility]     Status: None   Collection Time: 03/09/20  1:53 PM  Result Value Ref Range   ABO/RH(D) A POS    Antibody Screen NEG    Sample Expiration      03/12/2020,2359 Performed at [facility], [address] [place], [place] [address]   SARS Coronavirus 2 by RT PCR (hospital order, performed in [facility] hospital lab) Nasopharyngeal Nasopharyngeal Swab     Status: None   Collection Time: 03/09/20  4:55 PM   Specimen: Nasopharyngeal Swab  Result Value Ref Range   SARS Coronavirus 2 NEGATIVE NEGATIVE    Comment: (NOTE) SARS-CoV-2 target nucleic acids are NOT DETECTED.  The SARS-CoV-2 RNA is generally detectable in upper and lower respiratory specimens during the acute phase of infection. The lowest concentration of SARS-CoV-2 viral copies this assay can detect is 250 copies /  mL. A negative result does not preclude SARS-CoV-2 infection and should not be used as the sole basis for treatment or other patient management decisions.  A negative result may occur with improper specimen collection / handling, submission of  specimen other than nasopharyngeal swab, presence of viral mutation(s) within the areas targeted by this assay, and inadequate number of viral copies (<250 copies / mL). A negative result must be combined with clinical observations, patient history, and epidemiological information.  Fact Sheet for Patients:   StrictlyIdeas.no  Fact Sheet for Healthcare Providers: BankingDealers.co.za  This test is not yet approved or  cleared by the [place] FDA and has been authorized for detection and/or diagnosis of SARS-CoV-2 by FDA under an Emergency Use Authorization (EUA).  This EUA will remain in effect (meaning this test can be used) for the duration of the COVID-19 declaration under Section 564(b)(1) of the Act, 21 U.S.C. section 360bbb-3(b)(1), unless the authorization is terminated or revoked sooner.  Performed at [facility], [address] [place], [place] [address]   CBC     Status: Abnormal   Collection Time: 03/10/20  6:02 AM  Result Value Ref Range   WBC 7.8 4.0 - 10.5 K/uL   RBC 3.07 (L) 3.87 - 5.11 MIL/uL   Hemoglobin 9.5 (L) 12.0 - 15.0 g/dL   HCT 30.9 (L) 36 - 46 %   MCV 100.7 (H) 80.0 - 100.0 fL   MCH 30.9 26.0 - 34.0 pg   MCHC 30.7 30.0 - 36.0 g/dL   RDW 14.7 11.5 - 15.5 %   Platelets 190 150 - 400 K/uL   nRBC 0.0 0.0 - 0.2 %    Comment: Performed at [facility], [address] [place], [place] [address]  Basic metabolic panel     Status: Abnormal   Collection Time: 03/10/20  6:02 AM  Result Value Ref Range   Sodium 134 (L) 135 - 145 mmol/L   Potassium 4.5 3.5 - 5.1 mmol/L    Comment: DELTA CHECK NOTED   Chloride 97 (L) 98 - 111 mmol/L   CO2 25 22 - 32 mmol/L   Glucose, Bld 124 (H) 70 - 99 mg/dL    Comment: Glucose reference range applies only to samples taken after fasting for at least 8 hours.   BUN 23 8 - 23 mg/dL   Creatinine, Ser 1.24 (H) 0.44 - 1.00 mg/dL   Calcium 9.0 8.9 - 10.3 mg/dL   GFR calc non Af  Amer 38 (L) >60 mL/min   GFR calc Af Amer 44 (L) >60 mL/min   Anion gap 12 5 - 15    Comment: Performed at [facility], [address] [place], [place] [address]    DG Chest 1 View  Result Date: 03/09/2020 CLINICAL DATA:  Fall. EXAM: CHEST  1 VIEW COMPARISON:  February 29, 2020. FINDINGS: Stable cardiomegaly. Status post transcatheter aortic valve repair. Left-sided pacemaker is unchanged in position. No pneumothorax or pleural effusion is noted. Both lungs are clear. The visualized skeletal structures are unremarkable. IMPRESSION: No active disease. Aortic Atherosclerosis (ICD10-I70.0). Electronically Signed   By: Marijo Conception M.D.   On: 03/09/2020 14:37   CT HEAD WO CONTRAST  Result Date: 03/09/2020 CLINICAL DATA:  Head trauma, minor. Poly trauma, critical, head/cervical spine injury suspected. Additional history obtained from [place] slid out of bed and hit left side of head, bruising noted to left side of forehead, patient reports left leg pain. EXAM: CT HEAD WITHOUT CONTRAST  CT CERVICAL SPINE WITHOUT CONTRAST TECHNIQUE: Multidetector CT imaging of the head and cervical spine was performed following the standard protocol without intravenous contrast. Multiplanar CT image reconstructions of the cervical spine were also generated. COMPARISON:  Brain MRI 12/02/2019.  Head CT 08/25/2019. FINDINGS: CT HEAD FINDINGS Brain: Stable, moderate generalized parenchymal atrophy. Known chronic lacunar infarcts within the right corona radiata, basal ganglia, thalami and bilateral cerebellar hemispheres, some of which were better appreciated on the MRI of 12/02/2019. There is no acute intracranial hemorrhage. No demarcated cortical infarct. No extra-axial fluid collection. No evidence of intracranial mass. No midline shift. Vascular: No hyperdense vessel.  Atherosclerotic calcifications Skull: Normal. Negative for fracture or focal lesion. Sinuses/Orbits: Visualized orbits show no  acute finding. Scattered mild mucosal thickening and frothy secretions within the ethmoid air cells. No significant mastoid effusion. Other: Mild left frontal scalp/forehead soft tissue swelling. CT CERVICAL SPINE FINDINGS Mildly motion degraded examination Alignment: Cervical levocurvature. Straightening of the expected cervical lordosis. No significant spondylolisthesis. Skull base and vertebrae: The basion-dental and atlanto-dental intervals are maintained.No evidence of acute fracture to the cervical spine. Soft tissues and spinal canal: No prevertebral fluid or swelling. No visible canal hematoma. Disc levels: Cervical spondylosis with multilevel disc space narrowing, posterior disc osteophytes, uncovertebral and facet hypertrophy. A prominent C5-C6 posterior disc osteophyte complex contributes to severe bony spinal canal stenosis Upper chest: No consolidation within the imaged lung apices. No visible pneumothorax. IMPRESSION: CT head: 1. No evidence of acute intracranial abnormality. 2. Mild left frontal scalp/forehead soft tissue swelling. 3. Stable moderate generalized parenchymal atrophy and advanced chronic small vessel ischemic disease with multiple chronic lacunar infarcts as described. 4. Ethmoid sinusitis. CT cervical spine: 1. Mildly motion degraded examination. 2. No evidence of acute fracture to the cervical spine. 3. Cervical spondylosis as described. Most notably, a prominent C5-C6 posterior disc osteophyte complex contributes to severe spinal canal stenosis at this level, likely with some degree of spinal cord mass effect. Consider cervical spine MRI for further evaluation, as clinically warranted. Electronically Signed   By: Kellie Simmering DO   On: 03/09/2020 14:35   CT CERVICAL SPINE WO CONTRAST  Result Date: 03/09/2020 CLINICAL DATA:  Head trauma, minor. Poly trauma, critical, head/cervical spine injury suspected. Additional history obtained from [place] slid out  of bed and hit left side of head, bruising noted to left side of forehead, patient reports left leg pain. EXAM: CT HEAD WITHOUT CONTRAST CT CERVICAL SPINE WITHOUT CONTRAST TECHNIQUE: Multidetector CT imaging of the head and cervical spine was performed following the standard protocol without intravenous contrast. Multiplanar CT image reconstructions of the cervical spine were also generated. COMPARISON:  Brain MRI 12/02/2019.  Head CT 08/25/2019. FINDINGS: CT HEAD FINDINGS Brain: Stable, moderate generalized parenchymal atrophy. Known chronic lacunar infarcts within the right corona radiata, basal ganglia, thalami and bilateral cerebellar hemispheres, some of which were better appreciated on the MRI of 12/02/2019. There is no acute intracranial hemorrhage. No demarcated cortical infarct. No extra-axial fluid collection. No evidence of intracranial mass. No midline shift. Vascular: No hyperdense vessel.  Atherosclerotic calcifications Skull: Normal. Negative for fracture or focal lesion. Sinuses/Orbits: Visualized orbits show no acute finding. Scattered mild mucosal thickening and frothy secretions within the ethmoid air cells. No significant mastoid effusion. Other: Mild left frontal scalp/forehead soft tissue swelling. CT CERVICAL SPINE FINDINGS Mildly motion degraded examination Alignment: Cervical levocurvature. Straightening of the expected cervical lordosis. No significant spondylolisthesis. Skull base and vertebrae: The basion-dental and atlanto-dental intervals are maintained.No  evidence of acute fracture to the cervical spine. Soft tissues and spinal canal: No prevertebral fluid or swelling. No visible canal hematoma. Disc levels: Cervical spondylosis with multilevel disc space narrowing, posterior disc osteophytes, uncovertebral and facet hypertrophy. A prominent C5-C6 posterior disc osteophyte complex contributes to severe bony spinal canal stenosis Upper chest: No consolidation within the imaged lung  apices. No visible pneumothorax. IMPRESSION: CT head: 1. No evidence of acute intracranial abnormality. 2. Mild left frontal scalp/forehead soft tissue swelling. 3. Stable moderate generalized parenchymal atrophy and advanced chronic small vessel ischemic disease with multiple chronic lacunar infarcts as described. 4. Ethmoid sinusitis. CT cervical spine: 1. Mildly motion degraded examination. 2. No evidence of acute fracture to the cervical spine. 3. Cervical spondylosis as described. Most notably, a prominent C5-C6 posterior disc osteophyte complex contributes to severe spinal canal stenosis at this level, likely with some degree of spinal cord mass effect. Consider cervical spine MRI for further evaluation, as clinically warranted. Electronically Signed   By: Kellie Simmering DO   On: 03/09/2020 14:35   DG Knee Complete 4 Views Left  Result Date: 03/09/2020 CLINICAL DATA:  Left knee pain after fall. EXAM: LEFT KNEE - COMPLETE 4+ VIEW COMPARISON:  None. FINDINGS: No evidence of fracture, dislocation, or joint effusion. Moderate narrowing of the medial and lateral joint spaces are noted. Moderate patellar spurring is noted. Vascular calcifications are noted. IMPRESSION: Moderate degenerative joint disease. No acute abnormality seen in the left knee. Electronically Signed   By: Marijo Conception M.D.   On: 03/09/2020 14:40   DG Hip Unilat With Pelvis 2-3 Views Left  Result Date: 03/09/2020 CLINICAL DATA:  Left hip pain after fall. EXAM: DG HIP (WITH OR WITHOUT PELVIS) 2-3V LEFT COMPARISON:  None. FINDINGS: Moderately angulated and probably comminuted fracture is seen involving the intertrochanteric region of the proximal left femur. Right hip is unremarkable. IMPRESSION: Moderately angulated and probably comminuted intertrochanteric fracture of proximal left femur. Electronically Signed   By: Marijo Conception M.D.   On: 03/09/2020 14:38    Review of Systems  Unable to perform ROS: Acuity of condition   Blood  pressure (!) 166/77, pulse 60, temperature 98.4 F (36.9 C), temperature source Oral, resp. rate 18, height 5\' 2"  (1.575 m), weight 51 kg, SpO2 96 %. Physical Exam General appearance the patient has a small frame appears to be fully developed and adequate nutritional status  Cardiovascular exam no swelling or varicosities pulses are intact temperature of extremities are normal without edema or tenderness  Lymph nodes are negative in the groin  Patient is unable to ambulate secondary to hip fracture  Skin normal x4 extremities  Neuro could not test finger-to-nose or heel-to-shin based on acuity coordination could not be tested upper extremity reflexes are normal sensation distally is normal she is alert and oriented x3 without depression anxiety or agitation  Upper extremities show no malalignment no tenderness normal range of motion no instability normal muscle tone  Right lower extremity nontender normal alignment full range of motion no instability normal muscle tone  Left lower extremity is externally rotated is tender in the proximal thigh range of motion deferred because of pain no stability tests were done because of pain and muscle tone was normal without tremor   Assessment/Plan: 86 yo major medical problems high risk patient with displcaed intertrochanteric left hip fx.   OTIF LEFT HIP WITH GAMMA NAIL    Arther Abbott 03/10/2020, 7:36 AM

## 2020-03-10 NOTE — Brief Op Note (Signed)
03/10/2020  3:20 PM  PATIENT:  Crystal Cooper  86 y.o. female  PRE-OPERATIVE DIAGNOSIS:  left hip fracture-intertrochanteric hip fracture  POST-OPERATIVE DIAGNOSIS:  left hip fracture-intertrochanteric hip fracture  PROCEDURE:  Procedure(s): OPEN TREATMENT INTERNAL FIXATION LEFT HIP (WITH GAMMA NAIL) (Left)   [employer] gamma nail   125 90 lag Acorn / slide mode 35 distal screw   FINDINGS: 3 PT INTERTROCHANTERIC FRACTURE - POST BUTRESS INTACT   SURGEON:  Surgeon(s) and Role:    * Carole Civil, MD - Primary  The surgery was done in the following manner  The patient was seen in the preop area and the surgical site was confirmed and the surgical site was marked chart review was completed  The patient was taken to the operating for spinal anesthesia then placed on the fracture table with appropriate padding  The right leg was placed in a well leg holder with bony prominences and nerves padded.  The left leg was placed in a traction device.  Perineal post was used.  C-arm was brought in and the leg was manipulated with traction internal rotation until stable reduction was confirmed by x-ray in 2 planes  The leg was then prepped and draped sterilely and timeout was completed  A spinal needle was used to identify the contour of the femur and this was followed by incision over the trochanter which was extended proximally.  The subcutaneous tissue was divided down to the fascia which was cleared with a periosteal elevator.  The fascia was then incised finger dissection was used to find the tip of the trochanter and then a curved awl was used to pierce the trochanter and then a guidewire was passed into the femoral canal down to the knee this was confirmed by x-ray with the C arm  Proximal reamer was then placed over the guidewire and the reamer was passed down to the lesser troches  125 degree nail was chosen and was advanced over the guidewire.  However, the nail would not  advanced and radiographs confirm that the canal was tight therefore the nail was removed.  The femur was then reamed up to a size 13 with flexible reamers.  The nail was then advanced over the guidewire easily and the guidewire was removed  A second incision was made distal to the first incision and the lag screw cannula was advanced down to bone.  The lateral cortex was opened with a drill bit and then the C-arm was brought in to lateral position and rotation was confirmed as the guidewire was advanced to the subchondral bone of the femoral head.  This was manipulated until I was satisfied with the position in terms of tip to apex distance.  AP x-ray confirmed position  We then measured a 90 mm length set the triple reamer at 90 and passed over the guidewire followed by insertion of the 90 mm lag screw.  I inserted the lag screw per manufacture technique up to the subchondral bone.  Tip to apex distance looks good and the proximal acorn was placed, engagement was confirmed by toggling the screwdriver handle for the lag screw.  Once this was accomplished the traction was released and the wheel on the screwdriver was used to compress the fracture.  The screwdriver was released and the guidepin was removed  We then turned our attention distally and passed the cannula for the distal locking screw down to the skin made a small skin incision and then advanced a pointed tip cannula  down to bone move the C arm down in that direction and then drilled across the nail measuring off the drill bit 35 mm screw for distal locking.  This was passed and confirmed by x-ray  All wounds were irrigated bleeding was controlled with electrocautery and the distal wounds were closed with 0 Monocryl the proximal wound was closed with #1 Braylon for the fascia 0 Monocryl for the deep fat layer and then running Monocryl 2-0 for subcuticular closure.  Steri-Strips were applied and benzoin and sterile dressing    PHYSICIAN  ASSISTANT:   ASSISTANTS: Debbie Dallas  ANESTHESIA:   spinal  EBL:  50 mL   BLOOD ADMINISTERED:none  DRAINS: none   LOCAL MEDICATIONS USED:  MARCAINE     SPECIMEN:  No Specimen  DISPOSITION OF SPECIMEN:  N/A  COUNTS:  YES  TOURNIQUET:  * No tourniquets in log *  DICTATION: .Dragon Dictation  PLAN OF CARE: Admit to inpatient   PATIENT DISPOSITION:  PACU - hemodynamically stable.   Delay start of Pharmacological VTE agent (>24hrs) due to surgical blood loss or risk of bleeding: yes   Postop plan Full weightbearing No hip precautions Follow-up in a month for x-ray I have spoken with her cardiologist who has agreed that although she has risk of bleeding it is higher with anticoagulants other than baby aspirin 81 mg a day which should be stopped as soon as DVT risk has subsided which is approximately 6 weeks

## 2020-03-10 NOTE — Interval H&P Note (Signed)
History and Physical Interval Note:  03/10/2020 12:59 PM  Crystal Cooper  has presented today for surgery, with the diagnosis of left hip fracture-intertrochanteric hip fracture.  The various methods of treatment have been discussed with the patient and family. After consideration of risks, benefits and other options for treatment, the patient has consented to  Procedure(s): OPEN TREATMENT INTERNAL FIXATION LEFT HIP (WITH GAMMA NAIL) (Left) as a surgical intervention.  The patient's history has been reviewed, patient examined, no change in status, stable for surgery.  I have reviewed the patient's chart and labs.  Questions were answered to the patient's satisfaction.     Arther Abbott

## 2020-03-10 NOTE — Progress Notes (Signed)
PROGRESS NOTE    Crystal Cooper  PJK:[id number] DOB: May 02, 1927 DOA: 03/09/2020 PCP: [name], MD    Chief Complaint  Patient presents with  . Leg Pain    Brief Narrative:  As per H&P written by Dr. Waldron Labs 03/09/2020  Crystal Cooper  is a 86 y.o. 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 fib due to GI bleed, patient presents to ED secondary to fall, patient with history of dementia, but she is able to provide some history, as well it was obtained with some assistance from daughter and ED staff, apparently patient had a mechanical fall today, she had one of the care givers with her, where she lost her footing, and fell, she was unable to stand back up, she had severe swelling of left ankle, and unable to move leg into normal position, as well had significant bruising in the left side of the head, so EMS were called, patient herself denies any dizziness or lightheadedness preceding before, she denies any focal deficits, tingling or numbness, no fever or chills, she is vaccinated for Covid x2. - in ED her work-up was significant for potassium 3.4, creatinine at baseline of 1.29, hemoglobin at baseline of 10.7, CT head and cervical spine with no acute findings, left hip x-ray significant for intertrochanteric fracture of proximal femur, Ortho were consulted, plan for surgery tomorrow, [facility]ist consulted to admit.  Assessment & Plan: 1-left hip fracture -Continue supportive care and as needed analgesics. -Orthopedic service (Dr. Aline Brochure) has been consulted and will see patient this morning with anticipated surgical intervention around midday. -Will follow post  operative recommendations, physical therapy/Occupational Therapy assessment to determine next pain you for rehabilitation and care. -Currently NPO.  2-Essential hypertension -Slightly elevated in the setting of pain -Continue current antihypertensive regimen and adjust treatment as needed.  3-PAF (paroxysmal atrial fibrillation) (HCC)/junctional bradycardia -Rate control -No longer on anticoagulation secondary to bleeding. -Status post pacemaker implantation.  4-Duodenal arteriovenous malformation/care/GI bleed. -Continue PPI.  5-status post TAVR/chronic diastolic dysfunction -Compensated currently -Continue to follow daily weights, low-sodium diet and strict I's and O's. -Continue with home dose Lasix.  6-hypothyroidism -Continue Synthroid.  7-chronic kidney disease a stage IIIb -Appears to be at baseline -Maintain adequate hydration -Follow electrolytes trend closely.  8-COPD (chronic obstructive pulmonary disease) (HCC) -No requiring oxygen supplementation -No wheezing no using accessory muscles. -Continue incentive respirometer/flutter valve. -Continue home bronchodilator regimen.  9-Dementia without behavioral disturbance (HCC) -No behavioral disturbances appreciated. -Continue the use of Namenda -Continue supportive care.   DVT prophylaxis: SCD's Code Status: Full code Family Communication: Daughter at bedside Disposition:   Status is: Inpatient   Dispo: The patient is from: home              Anticipated d/c is to: To be determined              Anticipated d/c date is:2-3 days; could be longer based on potential need for skilled nursing facility placement for rehab.              Patient currently is not medically stable for discharge currently; patient complaining of left hip pain and will require surgical intervention.  Currently n.p.o. voiding to be taken to the OR.  Will follow postoperative  recommendations by orthopedic service and assessment by physical  therapy/Occupational Therapy determine next best venue for rehab.    Consultants:   Orthopedic service.  Procedures:  OTIF left hip with gamma nail later today (03/10/2020)   Antimicrobials:  None   Subjective: Afebrile, no chest pain, no nausea, no vomiting.  Patient reports pain in her left hip while trying to perform any movement or repositioning.  Objective: Vitals:   03/10/20 0600 03/10/20 0800 03/10/20 0823 03/10/20 1050  BP: (!) 166/77 (!) 180/79  (!) 159/76  Pulse: 60 65  62  Resp: 18 20  (!) 25  Temp: 98.4 F (36.9 C)   98.1 F (36.7 C)  TempSrc: Oral   Oral  SpO2: 96% 98% 94% 93%  Weight:    51 kg  Height:    5\' 2"  (1.575 m)    Intake/Output Summary (Last 24 hours) at 03/10/2020 1427 Last data filed at 03/10/2020 1415 Gross per 24 hour  Intake 1360 ml  Output --  Net 1360 ml   Filed Weights   03/09/20 1325 03/10/20 1050  Weight: 51 kg 51 kg    Examination:  General exam: Appears calm and in no major distress currently.  Patient denies chest pain, no nausea, no vomiting, no requiring oxygen supplementation and if no movement reports no pain in her left hip. Respiratory system: Good air movement bilaterally, no wheezing, no crackles; no using accessory muscles. Cardiovascular system: Rate controlled, no rubs, no gallops, no JVD on exam.  Gastrointestinal system: Abdomen is nondistended, soft and nontender. No organomegaly or masses felt. Normal bowel sounds heard. Central nervous system: Alert and oriented x2. No focal neurological deficits. Extremities: Externally rotated left lower extremity; decreased range of motion secondary to pain.  No cyanosis or clubbing. Skin: No petechiae. Psychiatry:  Mood & affect appropriate.     Data Reviewed: I have personally reviewed following labs and imaging studies  CBC: Recent Labs  Lab 03/09/20 1353 03/10/20 0602  WBC 6.0 7.8  NEUTROABS 4.9  --   HGB 10.7* 9.5*  HCT 34.8* 30.9*  MCV 100.6* 100.7*  PLT  200 376    Basic Metabolic Panel: Recent Labs  Lab 03/09/20 1353 03/10/20 0602  NA 135 134*  K 3.4* 4.5  CL 93* 97*  CO2 30 25  GLUCOSE 137* 124*  BUN 22 23  CREATININE 1.29* 1.24*  CALCIUM 9.0 9.0    GFR: Estimated Creatinine Clearance: 23.4 mL/min (A) (by C-G formula based on SCr of 1.24 mg/dL (H)).   Recent Results (from the past 240 hour(s))  SARS Coronavirus 2 by RT PCR (hospital order, performed in [facility] hospital lab) Nasopharyngeal Nasopharyngeal Swab     Status: None   Collection Time: 03/09/20  4:55 PM   Specimen: Nasopharyngeal Swab  Result Value Ref Range Status   SARS Coronavirus 2 NEGATIVE NEGATIVE Final    Comment: (NOTE) SARS-CoV-2 target nucleic acids are NOT DETECTED.  The SARS-CoV-2 RNA is generally detectable in upper and lower respiratory specimens during the acute phase of infection. The lowest concentration of SARS-CoV-2 viral copies this assay can detect is 250 copies / mL. A negative result does not preclude SARS-CoV-2 infection and should not be used as the sole basis for treatment or other patient management decisions.  A negative result may occur with improper specimen collection / handling, submission of specimen other than nasopharyngeal swab, presence of viral mutation(s) within the areas targeted by this assay, and inadequate number of viral copies (<250 copies /  mL). A negative result must be combined with clinical observations, patient history, and epidemiological information.  Fact Sheet for Patients:   StrictlyIdeas.no  Fact Sheet for Healthcare Providers: BankingDealers.co.za  This test is not yet approved or  cleared by the [place] FDA and has been authorized for detection and/or diagnosis of SARS-CoV-2 by FDA under an Emergency Use Authorization (EUA).  This EUA will remain in effect (meaning this test can be used) for the duration of the COVID-19 declaration under  Section 564(b)(1) of the Act, 21 U.S.C. section 360bbb-3(b)(1), unless the authorization is terminated or revoked sooner.  Performed at [facility], [address], [place], [place] [address]      Radiology Studies: DG Chest 1 View  Result Date: 03/09/2020 CLINICAL DATA:  Fall. EXAM: CHEST  1 VIEW COMPARISON:  February 29, 2020. FINDINGS: Stable cardiomegaly. Status post transcatheter aortic valve repair. Left-sided pacemaker is unchanged in position. No pneumothorax or pleural effusion is noted. Both lungs are clear. The visualized skeletal structures are unremarkable. IMPRESSION: No active disease. Aortic Atherosclerosis (ICD10-I70.0). Electronically Signed   By: Marijo Conception M.D.   On: 03/09/2020 14:37   CT HEAD WO CONTRAST  Result Date: 03/09/2020 CLINICAL DATA:  Head trauma, minor. Poly trauma, critical, head/cervical spine injury suspected. Additional history obtained from [place] slid out of bed and hit left side of head, bruising noted to left side of forehead, patient reports left leg pain. EXAM: CT HEAD WITHOUT CONTRAST CT CERVICAL SPINE WITHOUT CONTRAST TECHNIQUE: Multidetector CT imaging of the head and cervical spine was performed following the standard protocol without intravenous contrast. Multiplanar CT image reconstructions of the cervical spine were also generated. COMPARISON:  Brain MRI 12/02/2019.  Head CT 08/25/2019. FINDINGS: CT HEAD FINDINGS Brain: Stable, moderate generalized parenchymal atrophy. Known chronic lacunar infarcts within the right corona radiata, basal ganglia, thalami and bilateral cerebellar hemispheres, some of which were better appreciated on the MRI of 12/02/2019. There is no acute intracranial hemorrhage. No demarcated cortical infarct. No extra-axial fluid collection. No evidence of intracranial mass. No midline shift. Vascular: No hyperdense vessel.  Atherosclerotic calcifications Skull: Normal. Negative for fracture or focal  lesion. Sinuses/Orbits: Visualized orbits show no acute finding. Scattered mild mucosal thickening and frothy secretions within the ethmoid air cells. No significant mastoid effusion. Other: Mild left frontal scalp/forehead soft tissue swelling. CT CERVICAL SPINE FINDINGS Mildly motion degraded examination Alignment: Cervical levocurvature. Straightening of the expected cervical lordosis. No significant spondylolisthesis. Skull base and vertebrae: The basion-dental and atlanto-dental intervals are maintained.No evidence of acute fracture to the cervical spine. Soft tissues and spinal canal: No prevertebral fluid or swelling. No visible canal hematoma. Disc levels: Cervical spondylosis with multilevel disc space narrowing, posterior disc osteophytes, uncovertebral and facet hypertrophy. A prominent C5-C6 posterior disc osteophyte complex contributes to severe bony spinal canal stenosis Upper chest: No consolidation within the imaged lung apices. No visible pneumothorax. IMPRESSION: CT head: 1. No evidence of acute intracranial abnormality. 2. Mild left frontal scalp/forehead soft tissue swelling. 3. Stable moderate generalized parenchymal atrophy and advanced chronic small vessel ischemic disease with multiple chronic lacunar infarcts as described. 4. Ethmoid sinusitis. CT cervical spine: 1. Mildly motion degraded examination. 2. No evidence of acute fracture to the cervical spine. 3. Cervical spondylosis as described. Most notably, a prominent C5-C6 posterior disc osteophyte complex contributes to severe spinal canal stenosis at this level, likely with some degree of spinal cord mass effect. Consider cervical spine MRI for further evaluation, as clinically  warranted. Electronically Signed   By: Kellie Simmering DO   On: 03/09/2020 14:35   CT CERVICAL SPINE WO CONTRAST  Result Date: 03/09/2020 CLINICAL DATA:  Head trauma, minor. Poly trauma, critical, head/cervical spine injury suspected. Additional history obtained  from [place] slid out of bed and hit left side of head, bruising noted to left side of forehead, patient reports left leg pain. EXAM: CT HEAD WITHOUT CONTRAST CT CERVICAL SPINE WITHOUT CONTRAST TECHNIQUE: Multidetector CT imaging of the head and cervical spine was performed following the standard protocol without intravenous contrast. Multiplanar CT image reconstructions of the cervical spine were also generated. COMPARISON:  Brain MRI 12/02/2019.  Head CT 08/25/2019. FINDINGS: CT HEAD FINDINGS Brain: Stable, moderate generalized parenchymal atrophy. Known chronic lacunar infarcts within the right corona radiata, basal ganglia, thalami and bilateral cerebellar hemispheres, some of which were better appreciated on the MRI of 12/02/2019. There is no acute intracranial hemorrhage. No demarcated cortical infarct. No extra-axial fluid collection. No evidence of intracranial mass. No midline shift. Vascular: No hyperdense vessel.  Atherosclerotic calcifications Skull: Normal. Negative for fracture or focal lesion. Sinuses/Orbits: Visualized orbits show no acute finding. Scattered mild mucosal thickening and frothy secretions within the ethmoid air cells. No significant mastoid effusion. Other: Mild left frontal scalp/forehead soft tissue swelling. CT CERVICAL SPINE FINDINGS Mildly motion degraded examination Alignment: Cervical levocurvature. Straightening of the expected cervical lordosis. No significant spondylolisthesis. Skull base and vertebrae: The basion-dental and atlanto-dental intervals are maintained.No evidence of acute fracture to the cervical spine. Soft tissues and spinal canal: No prevertebral fluid or swelling. No visible canal hematoma. Disc levels: Cervical spondylosis with multilevel disc space narrowing, posterior disc osteophytes, uncovertebral and facet hypertrophy. A prominent C5-C6 posterior disc osteophyte complex contributes to severe bony spinal canal stenosis Upper  chest: No consolidation within the imaged lung apices. No visible pneumothorax. IMPRESSION: CT head: 1. No evidence of acute intracranial abnormality. 2. Mild left frontal scalp/forehead soft tissue swelling. 3. Stable moderate generalized parenchymal atrophy and advanced chronic small vessel ischemic disease with multiple chronic lacunar infarcts as described. 4. Ethmoid sinusitis. CT cervical spine: 1. Mildly motion degraded examination. 2. No evidence of acute fracture to the cervical spine. 3. Cervical spondylosis as described. Most notably, a prominent C5-C6 posterior disc osteophyte complex contributes to severe spinal canal stenosis at this level, likely with some degree of spinal cord mass effect. Consider cervical spine MRI for further evaluation, as clinically warranted. Electronically Signed   By: Kellie Simmering DO   On: 03/09/2020 14:35   DG Knee Complete 4 Views Left  Result Date: 03/09/2020 CLINICAL DATA:  Left knee pain after fall. EXAM: LEFT KNEE - COMPLETE 4+ VIEW COMPARISON:  None. FINDINGS: No evidence of fracture, dislocation, or joint effusion. Moderate narrowing of the medial and lateral joint spaces are noted. Moderate patellar spurring is noted. Vascular calcifications are noted. IMPRESSION: Moderate degenerative joint disease. No acute abnormality seen in the left knee. Electronically Signed   By: Marijo Conception M.D.   On: 03/09/2020 14:40   DG Hip Unilat With Pelvis 2-3 Views Left  Result Date: 03/09/2020 CLINICAL DATA:  Left hip pain after fall. EXAM: DG HIP (WITH OR WITHOUT PELVIS) 2-3V LEFT COMPARISON:  None. FINDINGS: Moderately angulated and probably comminuted fracture is seen involving the intertrochanteric region of the proximal left femur. Right hip is unremarkable. IMPRESSION: Moderately angulated and probably comminuted intertrochanteric fracture of proximal left femur. Electronically Signed   By: Bobbe Medico.D.  On: 03/09/2020 14:38    Scheduled Meds: . [MAR  Hold] umeclidinium bromide  1 puff Inhalation Daily   And  . [MAR Hold] arformoterol  15 mcg Nebulization BID  . [MAR Hold] budesonide (PULMICORT) nebulizer solution  0.25 mg Nebulization BID  . [MAR Hold] calcium carbonate  1 tablet Oral Q breakfast   And  . [MAR Hold] cholecalciferol  800 Units Oral Q breakfast  . chlorhexidine  60 mL Topical Once  . [MAR Hold] docusate sodium  100 mg Oral BID  . [MAR Hold] famotidine  20 mg Oral QHS  . [MAR Hold] ferrous sulfate  325 mg Oral BID WC  . [MAR Hold] furosemide  80 mg Oral Daily  . [MAR Hold] levothyroxine  25 mcg Oral Daily  . [MAR Hold] memantine  10 mg Oral BID  . [MAR Hold] metoprolol tartrate  25 mg Oral BID  . [MAR Hold] multivitamin with minerals  1 tablet Oral Daily  . [MAR Hold] pantoprazole  40 mg Oral BID  . [MAR Hold] potassium chloride  20 mEq Oral BID  . povidone-iodine  2 application Topical Once   Continuous Infusions: . sodium chloride Stopped (03/10/20 0752)  . lactated ringers 100 mL/hr at 03/10/20 0900  . [MAR Hold] methocarbamol (ROBAXIN) IV       LOS: 1 day    Time spent: 30 minutes    Barton Dubois, MD [facility]   To contact the attending provider between 7A-7P or the covering provider during after hours 7P-7A, please log into the web site [email or web address] and access using universal [place] password for that web site. If you do not have the password, please call the hospital operator.  03/10/2020, 2:27 PM

## 2020-03-10 NOTE — Anesthesia Postprocedure Evaluation (Signed)
Anesthesia Post Note  Patient: Crystal Cooper  Procedure(s) Performed: OPEN TREATMENT INTERNAL FIXATION LEFT HIP (WITH GAMMA NAIL) (Left Hip)  Patient location during evaluation: PACU Anesthesia Type: Spinal Level of consciousness: awake, awake and alert, patient cooperative and confused Pain management: pain level controlled Vital Signs Assessment: post-procedure vital signs reviewed and stable Respiratory status: spontaneous breathing, respiratory function stable and nonlabored ventilation Cardiovascular status: blood pressure returned to baseline and stable Postop Assessment: no headache and no backache Anesthetic complications: no   No complications documented.   Last Vitals:  Vitals:   03/10/20 0823 03/10/20 1050  BP:  (!) 159/76  Pulse:  62  Resp:  (!) 25  Temp:  36.7 C  SpO2: 94% 93%    Last Pain:  Vitals:   03/10/20 1050  TempSrc: Oral  PainSc: 0-No pain                 Tacy Learn

## 2020-03-10 NOTE — Op Note (Signed)
03/10/2020  3:20 PM  PATIENT:  Crystal Cooper  86 y.o. female  PRE-OPERATIVE DIAGNOSIS:  left hip fracture-intertrochanteric hip fracture  POST-OPERATIVE DIAGNOSIS:  left hip fracture-intertrochanteric hip fracture  PROCEDURE:  Procedure(s): OPEN TREATMENT INTERNAL FIXATION LEFT HIP (WITH GAMMA NAIL) (Left)   [employer]ker gamma nail   125 90 lag Acorn / slide mode 35 distal screw   FINDINGS: 3 PT INTERTROCHANTERIC FRACTURE - POST BUTRESS INTACT   SURGEON:  Surgeon(s) and Role:    * Carole Civil, MD - Primary  The surgery was done in the following manner  The patient was seen in the preop area and the surgical site was confirmed and the surgical site was marked chart review was completed  The patient was taken to the operating for spinal anesthesia then placed on the fracture table with appropriate padding  The right leg was placed in a well leg holder with bony prominences and nerves padded.  The left leg was placed in a traction device.  Perineal post was used.  C-arm was brought in and the leg was manipulated with traction internal rotation until stable reduction was confirmed by x-ray in 2 planes  The leg was then prepped and draped sterilely and timeout was completed  A spinal needle was used to identify the contour of the femur and this was followed by incision over the trochanter which was extended proximally.  The subcutaneous tissue was divided down to the fascia which was cleared with a periosteal elevator.  The fascia was then incised finger dissection was used to find the tip of the trochanter and then a curved awl was used to pierce the trochanter and then a guidewire was passed into the femoral canal down to the knee this was confirmed by x-ray with the C arm  Proximal reamer was then placed over the guidewire and the reamer was passed down to the lesser troches  125 degree nail was chosen and was advanced over the guidewire.  However, the nail would not  advanced and radiographs confirm that the canal was tight therefore the nail was removed.  The femur was then reamed up to a size 13 with flexible reamers.  The nail was then advanced over the guidewire easily and the guidewire was removed  A second incision was made distal to the first incision and the lag screw cannula was advanced down to bone.  The lateral cortex was opened with a drill bit and then the C-arm was brought in to lateral position and rotation was confirmed as the guidewire was advanced to the subchondral bone of the femoral head.  This was manipulated until I was satisfied with the position in terms of tip to apex distance.  AP x-ray confirmed position  We then measured a 90 mm length set the triple reamer at 90 and passed over the guidewire followed by insertion of the 90 mm lag screw.  I inserted the lag screw per manufacture technique up to the subchondral bone.  Tip to apex distance looks good and the proximal acorn was placed, engagement was confirmed by toggling the screwdriver handle for the lag screw.  Once this was accomplished the traction was released and the wheel on the screwdriver was used to compress the fracture.  The screwdriver was released and the guidepin was removed  We then turned our attention distally and passed the cannula for the distal locking screw down to the skin made a small skin incision and then advanced a pointed tip cannula  down to bone move the C arm down in that direction and then drilled across the nail measuring off the drill bit 35 mm screw for distal locking.  This was passed and confirmed by x-ray  All wounds were irrigated bleeding was controlled with electrocautery and the distal wounds were closed with 0 Monocryl the proximal wound was closed with #1 Braylon for the fascia 0 Monocryl for the deep fat layer and then running Monocryl 2-0 for subcuticular closure.  Steri-Strips were applied and benzoin and sterile dressing    PHYSICIAN  ASSISTANT:   ASSISTANTS: Debbie Dallas  ANESTHESIA:   spinal  EBL:  50 mL   BLOOD ADMINISTERED:none  DRAINS: none   LOCAL MEDICATIONS USED:  MARCAINE     SPECIMEN:  No Specimen  DISPOSITION OF SPECIMEN:  N/A  COUNTS:  YES  TOURNIQUET:  * No tourniquets in log *  DICTATION: .Dragon Dictation  PLAN OF CARE: Admit to inpatient   PATIENT DISPOSITION:  PACU - hemodynamically stable.   Delay start of Pharmacological VTE agent (>24hrs) due to surgical blood loss or risk of bleeding: yes   Postop plan Full weightbearing No hip precautions Follow-up in a month for x-ray I have spoken with her cardiologist who has agreed that although she has risk of bleeding it is higher with anticoagulants other than baby aspirin 81 mg a day which should be stopped as soon as DVT risk has subsided which is approximately 6 weeks

## 2020-03-10 NOTE — TOC Initial Note (Signed)
Transition of Care [facility]) - Initial/Assessment Note    Patient Details  Name: Crystal Cooper MRN: [id number] Date of Birth: 01-May-1927  Transition of Care [facility]) CM/SW Contact:    Iona Beard, [place] Phone Number: 03/10/2020, 11:35 AM  Clinical Narrative:                 Pt admitted for AVM and left hip fracture. Pt currently in surgery. TOC spoke with pts daughter Crystal Cooper to confirm SNF choices of [facility] or [facility]. CSW started [facility]; pending PT eval. TOC to follow.   Expected Discharge Plan: Skilled Nursing Facility Barriers to Discharge: Continued Medical Work up   Patient Goals and CMS Choice Patient states their goals for this hospitalization and ongoing recovery are:: Go to SNF. CMS Medicare.gov Compare Post Acute Care list provided to:: Patient Choice offered to / list presented to : Patient  Expected Discharge Plan and Services Expected Discharge Plan: [place]     Post Acute Care Choice: [place]                                        Prior Living Arrangements/Services                       Activities of Daily Living      Permission Sought/Granted                  Emotional Assessment           Psych Involvement: No (comment)  Admission diagnosis:  Fall [W19.XXXA] Closed left hip fracture ([place]) [S72.002A] Closed left hip fracture, initial encounter [facility]) [S72.002A] Patient Active Problem List   Diagnosis Date Noted  . Closed left hip fracture ([place]) 03/09/2020  . Upper GI bleed 02/19/2020  . Acute blood loss anemia 02/19/2020  . CKD (chronic kidney disease) stage 3, GFR 30-59 ml/min 02/19/2020  . Hematochezia 02/19/2020  . Iron deficiency anemia due to chronic blood loss 02/18/2020  . AVM (arteriovenous malformation) 02/18/2020  . COPD (chronic obstructive pulmonary disease) ([place]) 02/18/2020  . Dementia without behavioral disturbance ([place]) 02/18/2020  . SOB (shortness of  breath)   . COPD with acute exacerbation ([place])   . CAP (community acquired pneumonia) 02/12/2020  . Bilateral impacted cerumen 11/27/2019  . Long term (current) use of anticoagulants   . Acute renal failure superimposed on stage 3b chronic kidney disease ([place])   . Wheezing 09/23/2019  . Abnormal breath sounds 09/09/2019  . Heme positive stool   . Acute CVA (cerebrovascular accident) ([place]) 08/27/2019  . PNA (pneumonia) 08/25/2019  . Hypokalemia 08/23/2019  . Angio-edema, initial encounter 08/23/2019  . Reactive airway disease 03/24/2019  . Gastroesophageal reflux disease 03/24/2019  . CKD (chronic kidney disease), stage III 11/12/2018  . GI bleed 05/12/2018  . 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 Diastolic dysfunction 84/69/6295  . Pacemaker 05/12/2018  . Iron deficiency anemia   . Melena   . Duodenal arteriovenous malformation   . Gastritis and gastroduodenitis   . Normocytic anemia 04/06/2018  . Osteoporosis 08/27/2016  . ASCVD (arteriosclerotic cardiovascular disease) 01/11/2016  . Asthma 01/11/2016  . Glaucoma 01/11/2016  . Hearing loss 01/11/2016  . Other nonrheumatic mitral valve disorders 01/11/2016  . Junctional bradycardia   . PAF (paroxysmal atrial fibrillation) ([place]) 09/06/2015  . Hyponatremia 12/17/2014  . Congestive heart  failure ([place]) 12/12/2014  . Murmur 10/19/2011  . Chest pain 10/19/2011  . Essential hypertension 10/19/2011  . History of cardiac radiofrequency ablation (RFA) 10/19/2011   PCP:  [name], MD Pharmacy:   [facility] [address], [place] [address] Phone: [phone] Fax: [phone]     Social Determinants of Health (SDOH) Interventions    Readmission Risk Interventions Readmission Risk Prevention Plan 10/11/2019  Transportation Screening Complete  HRI or Home Care Consult Complete  Palliative Care Screening Not Applicable  Medication Review (RN  Care Manager) Complete  Some recent data might be hidden

## 2020-03-10 NOTE — ED Notes (Signed)
Pt taken to surgery

## 2020-03-10 NOTE — Transfer of Care (Signed)
Immediate Anesthesia Transfer of Care Note  Patient: Crystal Cooper  Procedure(s) Performed: OPEN TREATMENT INTERNAL FIXATION LEFT HIP (WITH GAMMA NAIL) (Left Hip)  Patient Location: PACU  Anesthesia Type:Spinal  Level of Consciousness: awake, alert  and patient cooperative  Airway & Oxygen Therapy: Patient Spontanous Breathing  Post-op Assessment: Report given to RN, Post -op Vital signs reviewed and stable and Patient moving all extremities  Post vital signs: Reviewed and stable  Last Vitals:  Vitals Value Taken Time  BP 122/66 03/10/20 1504  Temp    Pulse 95 03/10/20 1505  Resp 20 03/10/20 1505  SpO2 88 % 03/10/20 1505  Vitals shown include unvalidated device data.  Last Pain:  Vitals:   03/10/20 1050  TempSrc: Oral  PainSc: 0-No pain      Patients Stated Pain Goal: 8 (83/33/83 2919)  Complications: No complications documented.

## 2020-03-11 ENCOUNTER — Encounter (HOSPITAL_COMMUNITY): Payer: Self-pay | Admitting: Orthopedic Surgery

## 2020-03-11 LAB — CBC
HCT: 25.4 % — ABNORMAL LOW (ref 36.0–46.0)
Hemoglobin: 7.6 g/dL — ABNORMAL LOW (ref 12.0–15.0)
MCH: 30.3 pg (ref 26.0–34.0)
MCHC: 29.9 g/dL — ABNORMAL LOW (ref 30.0–36.0)
MCV: 101.2 fL — ABNORMAL HIGH (ref 80.0–100.0)
Platelets: 148 10*3/uL — ABNORMAL LOW (ref 150–400)
RBC: 2.51 MIL/uL — ABNORMAL LOW (ref 3.87–5.11)
RDW: 14.6 % (ref 11.5–15.5)
WBC: 6.7 10*3/uL (ref 4.0–10.5)
nRBC: 0 % (ref 0.0–0.2)

## 2020-03-11 LAB — GLUCOSE, CAPILLARY: Glucose-Capillary: 113 mg/dL — ABNORMAL HIGH (ref 70–99)

## 2020-03-11 MED ORDER — POLYSACCHARIDE IRON COMPLEX 150 MG PO CAPS
150.0000 mg | ORAL_CAPSULE | Freq: Two times a day (BID) | ORAL | Status: DC
  Administered 2020-03-11 – 2020-03-15 (×8): 150 mg via ORAL
  Filled 2020-03-11 (×8): qty 1

## 2020-03-11 MED ORDER — CHLORHEXIDINE GLUCONATE CLOTH 2 % EX PADS
6.0000 | MEDICATED_PAD | Freq: Every day | CUTANEOUS | Status: DC
  Administered 2020-03-11 – 2020-03-14 (×3): 6 via TOPICAL

## 2020-03-11 MED ORDER — ENSURE ENLIVE PO LIQD
237.0000 mL | ORAL | Status: DC
  Administered 2020-03-11 – 2020-03-15 (×5): 237 mL via ORAL

## 2020-03-11 NOTE — Progress Notes (Signed)
PROGRESS NOTE    Crystal Cooper  NTI:[id number] DOB: 1927/04/29 DOA: 03/09/2020 PCP: [name], MD    Chief Complaint  Patient presents with  . Leg Pain    Brief Narrative:  As per H&P written by Dr. Waldron Labs 03/09/2020  Crystal Cooper  is a 86 y.o. 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 fib due to GI bleed, patient presents to ED secondary to fall, patient with history of dementia, but she is able to provide some history, as well it was obtained with some assistance from daughter and ED staff, apparently patient had a mechanical fall today, she had one of the care givers with her, where she lost her footing, and fell, she was unable to stand back up, she had severe swelling of left ankle, and unable to move leg into normal position, as well had significant bruising in the left side of the head, so EMS were called, patient herself denies any dizziness or lightheadedness preceding before, she denies any focal deficits, tingling or numbness, no fever or chills, she is vaccinated for Covid x2. - in ED her work-up was significant for potassium 3.4, creatinine at baseline of 1.29, hemoglobin at baseline of 10.7, CT head and cervical spine with no acute findings, left hip x-ray significant for intertrochanteric fracture of proximal femur, Ortho were consulted, plan for surgery tomorrow, [facility] hospitalist consulted to admit.  Assessment & Plan: 1-left hip fracture -Continue supportive care and as needed analgesics. -Orthopedic service (Dr. Aline Brochure) consulted and patient is status post OTIF with gamma implant. -Will follow post operative recommendations by orthopedic service.   -aspirin for DVT prophylaxis for 6 weeks -no hip precautions -weight bearing as tolerated -will need SNF as per eval by PT.  2-Essential hypertension -Slightly elevated in the setting of pain -Continue current antihypertensive regimen and adjust treatment as needed.  3-PAF (paroxysmal atrial fibrillation) (HCC)/junctional bradycardia -Rate control -No longer on anticoagulation secondary to bleeding. -Status post pacemaker implantation. -continue telemetry monitoring   4-Duodenal arteriovenous malformation/care/GI bleed. -Continue PPI.  5-status post TAVR/chronic diastolic dysfunction -Compensated currently -Continue to follow daily weights, low-sodium diet and strict I's and O's. -Continue with home dose Lasix.  6-hypothyroidism -Continue Synthroid.  7-chronic kidney disease a stage IIIb -Appears to be at baseline -Maintain adequate hydration -Follow electrolytes trend closely.  8-COPD (chronic obstructive pulmonary disease) (HCC) -No wheezing no using accessory muscles. -Continue incentive respirometer/flutter valve. -Continue home bronchodilator regimen.  9-Dementia without behavioral disturbance (HCC) -No behavioral disturbances appreciated. -Continue the use of Namenda -Continue supportive care.  10-acute on chronic anemia: Patient with chronic anemia secondary to chronic kidney disease and now with acute postoperative bleeding anemia. -Will closely follow hemoglobin trend and transfuse for hemoglobin less than 7 -Niferex twice daily has been started.   DVT prophylaxis: SCD's Code Status: Full code Family Communication: Daughter at bedside Disposition:   Status is: Inpatient   Dispo: The patient is from: home              Anticipated d/c is to: To be determined              Anticipated d/c date is: 2-3 days; could be longer based on potential need for skilled nursing facility placement for rehab.  Patient currently is not medically stable for  discharge currently; patient complaining of left hip pain and will ended requiring skilled nursing facility for rehabilitation at discharge.   There has been a decrease in her hemoglobin level and will most likely in the requiring to be transfused in the next day or so.  Will closely follow CBC, TOC to assist with discharge, follow possibility recommendations by orthopedic service.    Consultants:   Orthopedic service.  Procedures:  OTIF left hip with gamma nail (03/10/2020)   Antimicrobials:  None   Subjective: No fever, no chest pain, no nausea, no vomiting, no shortness of breath.  Still intermittently complaining of left hip discomfort.  Weak and deconditioned.  Objective: Vitals:   03/10/20 2120 03/10/20 2254 03/11/20 0559 03/11/20 0717  BP:  (!) 123/57 (!) 111/57   Pulse:  (!) 58 (!) 59   Resp:   16   Temp:  99.1 F (37.3 C) 97.6 F (36.4 C)   TempSrc:  Oral    SpO2: (S) (!) 85% 99% 97% 100%  Weight:      Height:        Intake/Output Summary (Last 24 hours) at 03/11/2020 1746 Last data filed at 03/11/2020 1300 Gross per 24 hour  Intake 480 ml  Output --  Net 480 ml   Filed Weights   03/09/20 1325 03/10/20 1050 03/10/20 1602  Weight: 51 kg 51 kg 56.2 kg    Examination: General exam: Alert, awake, oriented x 2; still complaining of left hip discomfort when moving; weak and deconditioned.  No chest pain, no nausea, no vomiting, no fever. Respiratory system: No crackles, no wheezing, good air movement bilaterally. Cardiovascular system:Rate controlled, no rubs, no gallops, no JVD.[name] Gastrointestinal system: Abdomen is nondistended, soft and nontender. No organomegaly or masses felt. Normal bowel sounds heard. Central nervous system: Alert and oriented. No focal neurological deficits. Extremities: No cyanosis, clubbing or edema. Skin: No rashes, no petechiae; left hip wound with clean dressings, dry appearance and intact. Psychiatry: Mood & affect appropriate.     Data Reviewed: I have personally reviewed following labs and imaging studies  CBC: Recent Labs  Lab 03/09/20 1353 03/10/20 0602 03/11/20 0809  WBC 6.0 7.8 6.7  NEUTROABS 4.9  --   --   HGB 10.7* 9.5* 7.6*  HCT 34.8* 30.9* 25.4*  MCV 100.6* 100.7* 101.2*  PLT 200 190 148*    Basic Metabolic Panel: Recent Labs  Lab 03/09/20 1353 03/10/20 0602  NA 135 134*  K 3.4* 4.5  CL 93* 97*  CO2 30 25  GLUCOSE 137* 124*  BUN 22 23  CREATININE 1.29* 1.24*  CALCIUM 9.0 9.0    GFR: Estimated Creatinine Clearance: 23.4 mL/min (A) (by C-G formula based on SCr of 1.24 mg/dL (H)).   Recent Results (from the past 240 hour(s))  SARS Coronavirus 2 by RT PCR (hospital order, performed in [facility] hospital lab) Nasopharyngeal Nasopharyngeal Swab     Status: None   Collection Time: 03/09/20  4:55 PM   Specimen: Nasopharyngeal Swab  Result Value Ref Range Status   SARS Coronavirus 2 NEGATIVE NEGATIVE Final    Comment: (NOTE) SARS-CoV-2 target nucleic acids are NOT DETECTED.  The SARS-CoV-2 RNA is generally detectable in upper and lower respiratory specimens during the acute phase of infection. The lowest concentration of SARS-CoV-2 viral copies this assay can detect is 250 copies / mL. A negative result does not preclude SARS-CoV-2 infection and should not be used as the sole  basis for treatment or other patient management decisions.  A negative result may occur with improper specimen collection / handling, submission of specimen other than nasopharyngeal swab, presence of viral mutation(s) within the areas targeted by this assay, and inadequate number of viral copies (<250 copies / mL). A negative result must be combined with clinical observations, patient history, and epidemiological information.  Fact Sheet for Patients:   StrictlyIdeas.no  Fact Sheet for Healthcare Providers: BankingDealers.co.za  This test is not yet  approved or  cleared by the [place] FDA and has been authorized for detection and/or diagnosis of SARS-CoV-2 by FDA under an Emergency Use Authorization (EUA).  This EUA will remain in effect (meaning this test can be used) for the duration of the COVID-19 declaration under Section 564(b)(1) of the Act, 21 U.S.C. section 360bbb-3(b)(1), unless the authorization is terminated or revoked sooner.  Performed at [facility], [address] [place], [place] [address]      Radiology Studies: DG HIP UNILAT WITH PELVIS 1V LEFT  Result Date: 03/10/2020 CLINICAL DATA:  86 year old female status post left femur intertrochanteric fracture, ORIF. EXAM: DG HIP (WITH OR WITHOUT PELVIS) 1V*L* COMPARISON:  Intraoperative images 13 52 hours today. FINDINGS: Portable AP view at 1515 hours. Proximal left femur ORIF with intramedullary rod, proximal interlocking dynamic hip screw and distal interlocking cortical screw. Substantially improved AP alignment about the intertrochanteric fracture, near anatomic. No new osseous abnormality identified. Postoperative soft tissue gas about the hip. Calcified peripheral vascular disease. IMPRESSION: Proximal left femur ORIF with no adverse features. Electronically Signed   By: Genevie Ann M.D.   On: 03/10/2020 15:28   DG HIP OPERATIVE UNILAT WITH PELVIS LEFT  Result Date: 03/10/2020 CLINICAL DATA:  86 year old female status post left femur intertrochanteric fracture. EXAM: OPERATIVE LEFT HIP (WITH PELVIS IF PERFORMED) 6 VIEWS TECHNIQUE: Fluoroscopic spot image(s) were submitted for interpretation post-operatively. COMPARISON:  Left hip series 03/09/2020. FLUOROSCOPY TIME:  1 minutes 33.2 seconds.  20.68 mGy. FINDINGS: Six intraoperative fluoroscopic spot views of the proximal left femur demonstrate reduction of the intertrochanteric fracture with near anatomic alignment, placement of proximal intramedullary rod with proximal interlocking dynamic hip screw and distal  interlocking cortical screw. IMPRESSION: ORIF proximal left femur with no adverse features. Electronically Signed   By: Genevie Ann M.D.   On: 03/10/2020 15:27    Scheduled Meds: . umeclidinium bromide  1 puff Inhalation Daily   And  . arformoterol  15 mcg Nebulization BID  . aspirin EC  81 mg Oral Daily  . budesonide (PULMICORT) nebulizer solution  0.25 mg Nebulization BID  . calcium carbonate  1 tablet Oral Q breakfast   And  . cholecalciferol  800 Units Oral Q breakfast  . Chlorhexidine Gluconate Cloth  6 each Topical Daily  . docusate sodium  100 mg Oral BID  . famotidine  20 mg Oral QHS  . feeding supplement (ENSURE ENLIVE)  237 mL Oral Q24H  . ferrous sulfate  325 mg Oral BID WC  . furosemide  80 mg Oral Daily  . levothyroxine  25 mcg Oral Daily  . memantine  10 mg Oral BID  . metoprolol tartrate  25 mg Oral BID  . multivitamin with minerals  1 tablet Oral Daily  . pantoprazole  40 mg Oral BID  . potassium chloride  20 mEq Oral BID  . traMADol  50 mg Oral Q6H   Continuous Infusions: . sodium chloride Stopped (03/10/20 0752)  . sodium chloride 75 mL/hr at 03/10/20  1823  . acetaminophen 1,000 mg (03/11/20 5300)  . lactated ringers 100 mL/hr at 03/10/20 0900  . methocarbamol (ROBAXIN) IV    . methocarbamol (ROBAXIN) IV       LOS: 2 days    Time spent: 30 minutes    Barton Dubois, MD [facility]   To contact the attending provider between 7A-7P or the covering provider during after hours 7P-7A, please log into the web site [email or web address] and access using universal [place] password for that web site. If you do not have the password, please call the hospital operator.  03/11/2020, 5:46 PM

## 2020-03-11 NOTE — Progress Notes (Signed)
Initial Nutrition Assessment  DOCUMENTATION CODES:   Not applicable  INTERVENTION:  Ensure Enlive po daily, each supplement provides 350 kcal and 20 grams of protein (vanilla)  Encouraged po intake of meals and supplements  NUTRITION DIAGNOSIS:   Increased nutrient needs related to post-op healing, hip fracture as evidenced by estimated needs.   GOAL:   Patient will meet greater than or equal to 90% of their needs   MONITOR:   Weight trends, Labs, I & O's, Supplement acceptance, PO intake, Skin  REASON FOR ASSESSMENT:   Consult Hip fracture protocol  ASSESSMENT:  86 year old female with history of GERD, COPD, CKD stage IIIb, dCHF, junctional bradycardia s/p TAVR requiring PPM in 2017, Afib, HTN, dementia, hearing loss, and recent hospitalization 8/26-8/29 for GIB s/p EGD significant for gastric ulcer presented with severe left hip pain after mechanical fall at home and admitted for surgical intervention of intertrochanteric left hip fx.  Patient is s/p open treatment internal fixation of left hip with gamma nail on 03/10/20  Patient sitting up in bed eating ice cream this afternoon, daughter present in room. She endorses good appetite, reports eating most of her breakfast this morning, 75% per flowsheets. Recalls 3 meals/day at home, receives Meals on Wheels. She denied chewing/swallowing difficulties, reports well fitting dentures. Per chart, weights have been stable 114-116 lb over the last 3 months. Daughter reports pt weighs every morning, usual wt ~116 lb. Wts fluctuate secondary to CHF, takes 80 mg Lasix daily. Suspect current wt 56.2 kg (123.6 lb) is bed wt. Will continue to monitor. Lunch delivered during visit, RD assisted with set up. Pt states she loves mashed potatoes, appeared pleased with meal (pot roast with gravy and mashed potatoes). Educated on increased needs to support post-op wound healing, encouraged po intake of meals and recommended daily oral nutrition  supplement. Pt agreeable to vanilla Ensure.   Medications reviewed and include: Os-cal, Colace, Ferrous sulfate, Pepcid, Lasix 80 mg daily, Namenda, MVI, Protonix, Klor-con, IV acetaminophen  Labs: Hgb 7.6 (L), HCT 25.4 (L)  NUTRITION - FOCUSED PHYSICAL EXAM: Depletions to be expected given advanced age Moderate orbital fat depletion; Mild buccal; Mild muscle depletion to temple region;Moderate clavicle  Diet Order:   Diet Order            Diet Heart Room service appropriate? Yes; Fluid consistency: Thin  Diet effective now                 EDUCATION NEEDS:   Education needs have been addressed  Skin:  Skin Assessment: Skin Integrity Issues: Skin Integrity Issues:: Incisions, Other (Comment) Incisions: closed;L hip Other: ecchymosis; L temple region; arm  Last BM:  pta  Height:   Ht Readings from Last 1 Encounters:  03/10/20 5\' 2"  (1.575 m)    Weight:   Wt Readings from Last 1 Encounters:  03/10/20 56.2 kg    Ideal Body Weight:  50 kg  BMI:  Body mass index is 22.66 kg/m.  Estimated Nutritional Needs:   Kcal:  1400-1600  Protein:  65.-75  Fluid:  >/= 1.3 L   Lajuan Lines, RD, LDN Clinical Nutrition After Hours/Weekend Pager # in [place]

## 2020-03-11 NOTE — Plan of Care (Signed)
  Problem: Acute Rehab PT Goals(only PT should resolve) Goal: Pt Will Go Supine/Side To Sit Outcome: Progressing Flowsheets (Taken 03/11/2020 1043) Pt will go Supine/Side to Sit:  with moderate assist  with minimal assist Goal: Pt Will Go Sit To Supine/Side Outcome: Progressing Flowsheets (Taken 03/11/2020 1043) Pt will go Sit to Supine/Side:  with minimal assist  with moderate assist Goal: Patient Will Perform Sitting Balance Outcome: Progressing Flowsheets (Taken 03/11/2020 1043) Patient will perform sitting balance: with min guard assist Goal: Patient Will Transfer Sit To/From Stand Outcome: Progressing Flowsheets (Taken 03/11/2020 1043) Patient will transfer sit to/from stand: with moderate assist Goal: Pt Will Transfer Bed To Chair/Chair To Bed Outcome: Progressing Flowsheets (Taken 03/11/2020 1043) Pt will Transfer Bed to Chair/Chair to Bed: with mod assist Goal: Pt/caregiver will Perform Home Exercise Program Outcome: Progressing Flowsheets (Taken 03/11/2020 1043) Pt/caregiver will Perform Home Exercise Program:  For increased strengthening  For increased ROM  For improved balance  With Supervision, verbal cues required/provided  10:44 AM, 03/11/20 Mearl Latin PT, DPT Physical Therapist at [facility]

## 2020-03-11 NOTE — Plan of Care (Signed)

## 2020-03-11 NOTE — Evaluation (Signed)
Physical Therapy Evaluation Patient Details Name: Crystal Cooper MRN: [id number] DOB: 08-Feb-1927 Today's Date: 03/11/2020   History of Present Illness  Crystal Cooper  is a 86 y.o. 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 fib due to GI bleed, patient presents to ED secondary to fall, patient with history of dementia, but she is able to provide some history, as well it was obtained with some assistance from daughter and ED staff, apparently patient had a mechanical fall today, she had one of the care givers with her, where she lost her footing, and fell, she was unable to stand back up, she had severe swelling of left ankle, and unable to move leg into normal position, as well had significant bruising in the left side of the head, so EMS were called, patient herself denies any dizziness or lightheadedness preceding before, she denies any focal deficits, tingling or numbness, no fever or chills, she is vaccinated for Covid x2.    Clinical Impression  Patient limited for functional mobility as stated below secondary to L hip pain, weakness, fatigue and poor sitting balance. Patient given cueing for LE movement and to pull to seated EOB with limited carry over. Patient requires max assist to transition to seated EOB and max assist to remain sitting. Patient demonstrates impaired sitting balance but shows good sitting tolerance with R weight shift off L hip. Patient limited with sitting due to hip pain with weight bearing. Patient requires frequent cueing for sitting posture and postural control. Patient requires max assist to transfer back to supine position. She performs supine exercises with  frequent verbal, manual, and tactile cueing for mechanics. Patient's daughter is present for session and educated them on continuing supine exercises while in hospital for improved muscle activation, ROM, strengthening, and circulation.  Patient will benefit from continued physical therapy in hospital and recommended venue below to increase strength, balance, endurance for safe ADLs and gait.     Follow Up Recommendations SNF    Equipment Recommendations  None recommended by PT    Recommendations for Other Services       Precautions / Restrictions Precautions Precautions: Fall Precaution Comments: no hip precautions per MD note Restrictions Weight Bearing Restrictions: No Other Position/Activity Restrictions: full weight bearing per MD note      Mobility  Bed Mobility Overal bed mobility: Needs Assistance Bed Mobility: Supine to Sit;Sit to Supine     Supine to sit: Max assist;HOB elevated Sit to supine: Max assist   General bed mobility comments: frequent verbal cueing to attempt to pull to seated and for LE movement with limited carry over, Patient requires max assist to transition to seated EOB and to remain sitting  Transfers                    Ambulation/Gait                Stairs            Wheelchair Mobility    Modified Rankin (Stroke Patients Only)       Balance Overall balance assessment: Needs assistance Sitting-balance support: Bilateral upper extremity supported;Feet supported Sitting balance-Leahy Scale: Zero Sitting balance - Comments: zero/poor seated EOB, requires max assist to remain sitting  Pertinent Vitals/Pain Pain Assessment: Faces Faces Pain Scale: Hurts whole lot Pain Location: L hip with movement Pain Intervention(s): Limited activity within patient's tolerance;Monitored during session;Premedicated before session;Repositioned    Home Living Family/patient expects  to be discharged to:: Private residence Living Arrangements: Alone Available Help at Discharge: Family;Personal care attendant;Available 24 hours/day Type of Home: House Home Access: Stairs to enter Entrance Stairs-Rails: Left Entrance Stairs-Number of Steps: 3 Home Layout: One level Home Equipment: Cane - single point;Walker - 2 wheels;Grab bars - toilet;Grab bars - tub/shower      Prior Function Level of Independence: Needs assistance   Gait / Transfers Assistance Needed: pt ambulates household distances with cane; family and caregivers assist  ADL's / Homemaking Assistance Needed: Family and caregivers assist, patient independent with basic ADL        Hand Dominance   Dominant Hand: Right    Extremity/Trunk Assessment   Upper Extremity Assessment Upper Extremity Assessment: Generalized weakness    Lower Extremity Assessment Lower Extremity Assessment: Generalized weakness;LLE deficits/detail LLE Deficits / Details: limited ROM and strength throughout LLE due to pain LLE: Unable to fully assess due to pain       Communication   Communication: HOH  Cognition Arousal/Alertness: Awake/alert Behavior During Therapy: WFL for tasks assessed/performed Overall Cognitive Status: Within Functional Limits for tasks assessed                                        General Comments      Exercises General Exercises - Lower Extremity Ankle Circles/Pumps: AROM;AAROM;PROM;15 reps;Supine;Both Quad Sets: AROM;AAROM;15 reps;Supine;Both Heel Slides: AROM;AAROM;Both;15 reps;Supine   Assessment/Plan    PT Assessment Patient needs continued PT services  PT Problem List Decreased strength;Decreased mobility;Decreased activity tolerance;Decreased balance;Pain;Decreased range of motion;Decreased skin integrity       PT Treatment Interventions DME instruction;Therapeutic exercise;Gait training;Balance training;Stair training;Neuromuscular re-education;Functional  mobility training;Therapeutic activities;Patient/family education    PT Goals (Current goals can be found in the Care Plan section)  Acute Rehab PT Goals Patient Stated Goal: Return home after rehab PT Goal Formulation: With patient/family Time For Goal Achievement: 03/25/20 Potential to Achieve Goals: Fair    Frequency Min 3X/week   Barriers to discharge        Co-evaluation               AM-PAC PT "6 Clicks" Mobility  Outcome Measure Help needed turning from your back to your side while in a flat bed without using bedrails?: A Little Help needed moving from lying on your back to sitting on the side of a flat bed without using bedrails?: A Lot Help needed moving to and from a bed to a chair (including a wheelchair)?: Total Help needed standing up from a chair using your arms (e.g., wheelchair or bedside chair)?: Total Help needed to walk in hospital room?: Total Help needed climbing 3-5 steps with a railing? : Total 6 Click Score: 9    End of Session Equipment Utilized During Treatment: Oxygen Activity Tolerance: Patient limited by fatigue;Patient limited by pain Patient left: in bed;with call bell/phone within reach;with bed alarm set;with family/visitor present Nurse Communication: Mobility status PT Visit Diagnosis: Unsteadiness on feet (R26.81);Other abnormalities of gait and mobility (R26.89);Muscle weakness (generalized) (M62.81)    Time: 9163-8466 PT Time Calculation (min) (ACUTE ONLY): 35 min   Charges:   PT Evaluation $PT Eval Low Complexity: 1 Low PT Treatments $Therapeutic Exercise:  8-22 mins $Therapeutic Activity: 8-22 mins       10:42 AM, 03/11/20 Mearl Latin PT, DPT Physical Therapist at [facility]

## 2020-03-11 NOTE — NC FL2 (Signed)
[place] MEDICAID FL2 LEVEL OF CARE SCREENING TOOL     IDENTIFICATION  Patient Name: Crystal Cooper Birthdate: 12/30/1926 Sex: female Admission Date (Current Location): 03/09/2020  [facility] and [place] Number:  [employer] and Address:  [place] [address], [employer]      Provider Number: [id number]  Attending Physician Name and Address:  Barton Dubois, MD  Relative Name and Phone Number:  Marin Roberts (daughter) Ph: [phone]    Current Level of Care: Hospital Recommended Level of Care: [place] Prior Approval Number:    Date Approved/Denied:   PASRR Number: [id number]  Discharge Plan: SNF    Current Diagnoses: Patient Active Problem List   Diagnosis Date Noted  . Displaced intertrochanteric fracture of left femur, initial encounter for closed fracture ([place]) 03/09/2020  . Upper GI bleed 02/19/2020  . Acute blood loss anemia 02/19/2020  . CKD (chronic kidney disease) stage 3, GFR 30-59 ml/min 02/19/2020  . Hematochezia 02/19/2020  . Iron deficiency anemia due to chronic blood loss 02/18/2020  . AVM (arteriovenous malformation) 02/18/2020  . COPD (chronic obstructive pulmonary disease) ([place]) 02/18/2020  . Dementia without behavioral disturbance ([place]) 02/18/2020  . SOB (shortness of breath)   . COPD with acute exacerbation ([place])   . CAP (community acquired pneumonia) 02/12/2020  . Bilateral impacted cerumen 11/27/2019  . Long term (current) use of anticoagulants   . Acute renal failure superimposed on stage 3b chronic kidney disease ([place])   . Wheezing 09/23/2019  . Abnormal breath sounds 09/09/2019  . Heme positive stool   . Acute CVA (cerebrovascular accident) ([place]) 08/27/2019  . PNA (pneumonia) 08/25/2019  . Hypokalemia 08/23/2019  . Angio-edema, initial encounter 08/23/2019  . Reactive airway disease 03/24/2019  . Gastroesophageal reflux disease 03/24/2019  . CKD (chronic kidney disease), stage  III 11/12/2018  . GI bleed 05/12/2018  . 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 Diastolic dysfunction 42/59/5638  . Pacemaker 05/12/2018  . Iron deficiency anemia   . Melena   . Duodenal arteriovenous malformation   . Gastritis and gastroduodenitis   . Normocytic anemia 04/06/2018  . Osteoporosis 08/27/2016  . ASCVD (arteriosclerotic cardiovascular disease) 01/11/2016  . Asthma 01/11/2016  . Glaucoma 01/11/2016  . Hearing loss 01/11/2016  . Other nonrheumatic mitral valve disorders 01/11/2016  . Junctional bradycardia   . PAF (paroxysmal atrial fibrillation) ([place]) 09/06/2015  . Hyponatremia 12/17/2014  . Congestive heart failure ([place]) 12/12/2014  . Murmur 10/19/2011  . Chest pain 10/19/2011  . Essential hypertension 10/19/2011  . History of cardiac radiofrequency ablation (RFA) 10/19/2011    Orientation RESPIRATION BLADDER Height & Weight     Self, Place  Normal Incontinent, External catheter Weight: 123 lb 14.4 oz (56.2 kg) Height:  5\' 2"  (157.5 cm)  BEHAVIORAL SYMPTOMS/MOOD NEUROLOGICAL BOWEL NUTRITION STATUS      Incontinent Diet (Heart healthy)  AMBULATORY STATUS COMMUNICATION OF NEEDS Skin   Extensive Assist Verbally Other (Comment) (Ecchymosis: arms (bilateral))                       Personal Care Assistance Level of Assistance  Bathing, Feeding, Dressing Bathing Assistance: Maximum assistance Feeding assistance: Independent Dressing Assistance: Maximum assistance     Functional Limitations Info  Sight, Hearing, Speech Sight Info: Adequate Hearing Info: Impaired Speech Info: Adequate    SPECIAL CARE FACTORS FREQUENCY  PT (By licensed PT)     PT Frequency: 5x's/week  Contractures      Additional Factors Info  Code Status, Allergies Code Status Info: Full Allergies Info: Hctz (Hydrochlorothiazide); Aspirin; Codeine           Current Medications (03/11/2020):  This is the current hospital active  medication list Current Facility-Administered Medications  Medication Dose Route Frequency Provider Last Rate Last Admin  . 0.9 %  sodium chloride infusion   Intravenous Continuous Elgergawy, Silver Huguenin, MD   Stopped at 03/10/20 [phone]  . 0.9 %  sodium chloride infusion   Intravenous Continuous Carole Civil, MD 75 mL/hr at 03/10/20 1823 New Bag at 03/10/20 1823  . acetaminophen (OFIRMEV) IV 1,000 mg  1,000 mg Intravenous Q6H Carole Civil, MD 400 mL/hr at 03/11/20 0613 1,000 mg at 03/11/20 0613  . albuterol (VENTOLIN HFA) 108 (90 Base) MCG/ACT inhaler 2 puff  2 puff Inhalation Q4H PRN Elgergawy, Silver Huguenin, MD      . umeclidinium bromide (INCRUSE ELLIPTA) 62.5 MCG/INH 1 puff  1 puff Inhalation Daily Elgergawy, Silver Huguenin, MD   1 puff at 03/11/20 0717   And  . arformoterol (BROVANA) nebulizer solution 15 mcg  15 mcg Nebulization BID Elgergawy, Silver Huguenin, MD   15 mcg at 03/11/20 0717  . aspirin EC tablet 81 mg  81 mg Oral Daily Carole Civil, MD   81 mg at 03/11/20 1115  . budesonide (PULMICORT) nebulizer solution 0.25 mg  0.25 mg Nebulization BID Elgergawy, Silver Huguenin, MD   0.25 mg at 03/11/20 0717  . calcium carbonate (OS-CAL - dosed in mg of elemental calcium) tablet 500 mg of elemental calcium  1 tablet Oral Q breakfast Elgergawy, Silver Huguenin, MD   500 mg of elemental calcium at 03/11/20 0858   And  . cholecalciferol (VITAMIN D3) tablet 800 Units  800 Units Oral Q breakfast Elgergawy, Silver Huguenin, MD   800 Units at 03/11/20 0858  . Chlorhexidine Gluconate Cloth 2 % PADS 6 each  6 each Topical Daily Barton Dubois, MD   6 each at 03/11/20 1117  . docusate sodium (COLACE) capsule 100 mg  100 mg Oral BID Carole Civil, MD   100 mg at 03/11/20 1114  . famotidine (PEPCID) tablet 20 mg  20 mg Oral QHS Elgergawy, Silver Huguenin, MD   20 mg at 03/10/20 2050  . feeding supplement (ENSURE ENLIVE) (ENSURE ENLIVE) liquid 237 mL  237 mL Oral Q24H Barton Dubois, MD      . fentaNYL (SUBLIMAZE) injection 50  mcg  50 mcg Intravenous Q30 min PRN Elgergawy, Silver Huguenin, MD   50 mcg at 03/09/20 1432  . ferrous sulfate tablet 325 mg  325 mg Oral BID WC Elgergawy, Silver Huguenin, MD   325 mg at 03/11/20 0858  . furosemide (LASIX) tablet 80 mg  80 mg Oral Daily Elgergawy, Silver Huguenin, MD   80 mg at 03/11/20 1117  . HYDROcodone-acetaminophen (NORCO/VICODIN) 5-325 MG per tablet 1-2 tablet  1-2 tablet Oral Q6H PRN Elgergawy, Silver Huguenin, MD   1 tablet at 03/11/20 0858  . lactated ringers infusion   Intravenous Continuous [place], Rajamani C, MD 100 mL/hr at 03/10/20 0900 New Bag at 03/10/20 1415  . levothyroxine (SYNTHROID) tablet 25 mcg  25 mcg Oral Daily Elgergawy, Silver Huguenin, MD   25 mcg at 03/11/20 [phone]  . memantine (NAMENDA) tablet 10 mg  10 mg Oral BID Elgergawy, Silver Huguenin, MD   10 mg at 03/11/20 1114  . menthol-cetylpyridinium (CEPACOL) lozenge 3 mg  1 lozenge Oral  PRN Carole Civil, MD       Or  . phenol (CHLORASEPTIC) mouth spray 1 spray  1 spray Mouth/Throat PRN Carole Civil, MD      . methocarbamol (ROBAXIN) tablet 500 mg  500 mg Oral Q6H PRN Elgergawy, Silver Huguenin, MD   500 mg at 03/11/20 1114   Or  . methocarbamol (ROBAXIN) 500 mg in dextrose 5 % 50 mL IVPB  500 mg Intravenous Q6H PRN Elgergawy, Silver Huguenin, MD      . methocarbamol (ROBAXIN) tablet 500 mg  500 mg Oral Q6H PRN Carole Civil, MD   500 mg at 03/10/20 2304   Or  . methocarbamol (ROBAXIN) 500 mg in dextrose 5 % 50 mL IVPB  500 mg Intravenous Q6H PRN Carole Civil, MD      . metoCLOPramide (REGLAN) tablet 5-10 mg  5-10 mg Oral Q8H PRN Carole Civil, MD       Or  . metoCLOPramide (REGLAN) injection 5-10 mg  5-10 mg Intravenous Q8H PRN Carole Civil, MD      . metoprolol tartrate (LOPRESSOR) tablet 25 mg  25 mg Oral BID Elgergawy, Silver Huguenin, MD   25 mg at 03/11/20 1115  . morphine 2 MG/ML injection 0.5 mg  0.5 mg Intravenous Q2H PRN Elgergawy, Silver Huguenin, MD      . multivitamin with minerals tablet 1 tablet  1 tablet Oral Daily  Elgergawy, Silver Huguenin, MD   1 tablet at 03/11/20 1115  . ondansetron (ZOFRAN) tablet 4 mg  4 mg Oral Q6H PRN Carole Civil, MD       Or  . ondansetron [facility]) injection 4 mg  4 mg Intravenous Q6H PRN Carole Civil, MD      . pantoprazole (PROTONIX) EC tablet 40 mg  40 mg Oral BID Elgergawy, Silver Huguenin, MD   40 mg at 03/11/20 1117  . potassium chloride SA (KLOR-CON) CR tablet 20 mEq  20 mEq Oral BID Elgergawy, Silver Huguenin, MD   20 mEq at 03/11/20 1115  . senna-docusate (Senokot-S) tablet 1 tablet  1 tablet Oral QHS PRN Carole Civil, MD      . traMADol Veatrice [facility]bon) tablet 50 mg  50 mg Oral Q6H Carole Civil, MD   50 mg at 03/11/20 2902     Discharge Medications: Please see discharge summary for a list of discharge medications.  Relevant Imaging Results:  Relevant Lab Results:   Additional Information SSN: [id number]  Sherie Don, LCSW

## 2020-03-11 NOTE — Progress Notes (Signed)
Patient ID: Crystal Cooper, female   DOB: [birth year], 86 y.o.   MRN: [id number]  Postoperative day #1 status post open treatment internal fixation left hip fracture  Diagnosis left intertrochanteric hip fracture  Implant gamma nail short  BP (!) 111/57 (BP Location: Left Arm)   Pulse (!) 59   Temp 97.6 F (36.4 C)   Resp 16   Ht 5\' 2"  (1.575 m)   Wt 56.2 kg   SpO2 100%   BMI 22.66 kg/m   Nurses report patient had a good night.  Dressing is dry limb alignment looks great neurovascular exam is intact no calf edema swelling or tenderness  CBC Latest Ref Rng & Units 03/11/2020 03/10/2020 03/09/2020  WBC 4.0 - 10.5 K/uL 6.7 7.8 6.0  Hemoglobin 12.0 - 15.0 g/dL 7.6(L) 9.5(L) 10.7(L)  Hematocrit 36 - 46 % 25.4(L) 30.9(L) 34.8(L)  Platelets 150 - 400 K/uL 148(L) 190 200   Hemoglobin is 7.6 I expect the patient to be orthostatic upon attempted standing and as expected will probably need transfusion  I have spoken to the cardiologist who takes care of her and he agreed that aspirin on a limited basis short-term 81 mg would be of sufficient and appropriate based on her bleeding history

## 2020-03-12 LAB — CBC
HCT: 25.8 % — ABNORMAL LOW (ref 36.0–46.0)
Hemoglobin: 8 g/dL — ABNORMAL LOW (ref 12.0–15.0)
MCH: 31.4 pg (ref 26.0–34.0)
MCHC: 31 g/dL (ref 30.0–36.0)
MCV: 101.2 fL — ABNORMAL HIGH (ref 80.0–100.0)
Platelets: 170 10*3/uL (ref 150–400)
RBC: 2.55 MIL/uL — ABNORMAL LOW (ref 3.87–5.11)
RDW: 14.4 % (ref 11.5–15.5)
WBC: 11 10*3/uL — ABNORMAL HIGH (ref 4.0–10.5)
nRBC: 0 % (ref 0.0–0.2)

## 2020-03-12 NOTE — Progress Notes (Signed)
PROGRESS NOTE    Crystal Cooper  UXN:[id number] DOB: [birth year] DOA: 03/09/2020 PCP: [name], MD    Chief Complaint  Patient presents with  . Leg Pain    Brief Narrative:  As per H&P written by Dr. Waldron Labs 03/09/2020  Crystal Cooper  is a 86 y.o. 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 fib due to GI bleed, patient presents to ED secondary to fall, patient with history of dementia, but she is able to provide some history, as well it was obtained with some assistance from daughter and ED staff, apparently patient had a mechanical fall today, she had one of the care givers with her, where she lost her footing, and fell, she was unable to stand back up, she had severe swelling of left ankle, and unable to move leg into normal position, as well had significant bruising in the left side of the head, so EMS were called, patient herself denies any dizziness or lightheadedness preceding before, she denies any focal deficits, tingling or numbness, no fever or chills, she is vaccinated for Covid x2. - in ED her work-up was significant for potassium 3.4, creatinine at baseline of 1.29, hemoglobin at baseline of 10.7, CT head and cervical spine with no acute findings, left hip x-ray significant for intertrochanteric fracture of proximal femur, Ortho were consulted, plan for surgery tomorrow, [facility] hospitalist consulted to admit.  Assessment & Plan: 1-left hip fracture -Continue supportive care and as needed analgesics. -Orthopedic service (Dr. Aline Brochure) consulted and patient is status post OTIF with gamma implant. -Will follow post operative recommendations by orthopedic service.   -aspirin for DVT prophylaxis for 6 weeks -no hip precautions -weight bearing as tolerated -will need SNF as per eval by PT.  2-Essential hypertension -Slightly elevated in the setting of pain -Continue current antihypertensive regimen and adjust treatment as needed.  3-PAF (paroxysmal atrial fibrillation) (HCC)/junctional bradycardia -Rate control -No longer on anticoagulation secondary to bleeding. -Status post pacemaker implantation. -continue telemetry monitoring   4-Duodenal arteriovenous malformation/care/GI bleed. -Continue PPI.  5-status post TAVR/chronic diastolic dysfunction -Compensated currently -Continue to follow daily weights, low-sodium diet and strict I's and O's. -Continue with home dose Lasix.  6-hypothyroidism -Continue Synthroid.  7-chronic kidney disease a stage IIIb -Appears to be at baseline -Maintain adequate hydration -Follow electrolytes trend closely.  8-COPD (chronic obstructive pulmonary disease) (HCC) -No wheezing no using accessory muscles. -Continue incentive respirometer/flutter valve. -Continue home bronchodilator regimen.  9-Dementia without behavioral disturbance (HCC) -No behavioral disturbances appreciated. -Continue the use of Namenda -Continue supportive care.  10-acute on chronic anemia: Patient with chronic anemia secondary to chronic kidney disease and now with acute postoperative bleeding anemia. -Will closely follow hemoglobin trend and transfuse for hemoglobin less than 7 -Niferex twice daily has been started. -Hemoglobin 8.0 -Continue monitoring trend.   DVT prophylaxis: SCD's Code Status: Full code Family Communication: Daughter at bedside Disposition:   Status is: Inpatient   Dispo: The patient is from: home              Anticipated d/c is to: To be determined              Anticipated d/c date is: 2-3 days; could be longer based on potential need for skilled nursing  facility placement for rehab.               Patient currently is not medically stable for discharge currently; patient complaining of left hip pain and will ended requiring skilled nursing facility for rehabilitation at discharge.   There has been a decrease in her hemoglobin level and will most likely in the requiring to be transfused in the next day or so.  Will closely follow CBC, TOC to assist with discharge, follow possibility recommendations by orthopedic service.    Consultants:   Orthopedic service.  Procedures:  OTIF left hip with gamma nail (03/10/2020)   Antimicrobials:  None   Subjective: No fever, no chest pain, no nausea, no vomiting, no palpitations.  Weak, deconditioned still having some pain on her left hip with movement.  Objective: Vitals:   03/11/20 2100 03/12/20 0508 03/12/20 0949 03/12/20 1504  BP: (!) 99/49 (!) 125/56  128/60  Pulse: (!) 59 (!) 59  62  Resp: 16 16  18   Temp: 97.7 F (36.5 C) 98.4 F (36.9 C)  98.3 F (36.8 C)  TempSrc: Oral Oral  Oral  SpO2: 100% 100% 100% 92%  Weight:      Height:        Intake/Output Summary (Last 24 hours) at 03/12/2020 1709 Last data filed at 03/12/2020 0854 Gross per 24 hour  Intake 1330.45 ml  Output 1400 ml  Net -69.55 ml   Filed Weights   03/09/20 1325 03/10/20 1050 03/10/20 1602  Weight: 51 kg 51 kg 56.2 kg    Examination: General exam: Alert, awake, oriented x 2; sleeping, resting and in no acute distress.  Reports some improvement in her left hip pain.  No chest pain, no nausea, no vomiting. Respiratory system: Clear to auscultation. Respiratory effort normal.  Good air movement bilaterally. Cardiovascular system:RRR. No murmurs, rubs, gallops. Gastrointestinal system: Abdomen is nondistended, soft and nontender. No organomegaly or masses felt. Normal bowel sounds heard. Central nervous system: Alert and oriented. No focal neurological deficits. Extremities: No cyanosis or clubbing; no edema.  Decreased range of motion on her left side  secondary to pain in her hip with movement. Skin: No rashes, no petechiae.  Left hip wound with clean dressings, tried and intact. Psychiatry: Mood & affect appropriate.    Data Reviewed: I have personally reviewed following labs and imaging studies  CBC: Recent Labs  Lab 03/09/20 1353 03/10/20 0602 03/11/20 0809 03/12/20 0616  WBC 6.0 7.8 6.7 11.0*  NEUTROABS 4.9  --   --   --   HGB 10.7* 9.5* 7.6* 8.0*  HCT 34.8* 30.9* 25.4* 25.8*  MCV 100.6* 100.7* 101.2* 101.2*  PLT 200 190 148* 664    Basic Metabolic Panel: Recent Labs  Lab 03/09/20 1353 03/10/20 0602  NA 135 134*  K 3.4* 4.5  CL 93* 97*  CO2 30 25  GLUCOSE 137* 124*  BUN 22 23  CREATININE 1.29* 1.24*  CALCIUM 9.0 9.0    GFR: Estimated Creatinine Clearance: 23.4 mL/min (A) (by C-G formula based on SCr of 1.24 mg/dL (H)).   Recent Results (from the past 240 hour(s))  SARS Coronavirus 2 by RT PCR (hospital order, performed in [facility] hospital lab) Nasopharyngeal Nasopharyngeal Swab     Status: None   Collection Time: 03/09/20  4:55 PM   Specimen: Nasopharyngeal Swab  Result Value Ref Range Status   SARS Coronavirus 2 NEGATIVE NEGATIVE Final    Comment: (NOTE) SARS-CoV-2 target nucleic acids are NOT DETECTED.  The  SARS-CoV-2 RNA is generally detectable in upper and lower respiratory specimens during the acute phase of infection. The lowest concentration of SARS-CoV-2 viral copies this assay can detect is 250 copies / mL. A negative result does not preclude SARS-CoV-2 infection and should not be used as the sole basis for treatment or other patient management decisions.  A negative result may occur with improper specimen collection / handling, submission of specimen other than nasopharyngeal swab, presence of viral mutation(s) within the areas targeted by this assay, and inadequate number of viral copies (<250 copies / mL). A negative result must be combined with clinical observations, patient history,  and epidemiological information.  Fact Sheet for Patients:   StrictlyIdeas.no  Fact Sheet for Healthcare Providers: BankingDealers.co.za  This test is not yet approved or  cleared by the [place] FDA and has been authorized for detection and/or diagnosis of SARS-CoV-2 by FDA under an Emergency Use Authorization (EUA).  This EUA will remain in effect (meaning this test can be used) for the duration of the COVID-19 declaration under Section 564(b)(1) of the Act, 21 U.S.C. section 360bbb-3(b)(1), unless the authorization is terminated or revoked sooner.  Performed at [facility], [address], [place], [place] [address]      Radiology Studies: No results found.  Scheduled Meds: . umeclidinium bromide  1 puff Inhalation Daily   And  . arformoterol  15 mcg Nebulization BID  . aspirin EC  81 mg Oral Daily  . budesonide (PULMICORT) nebulizer solution  0.25 mg Nebulization BID  . calcium carbonate  1 tablet Oral Q breakfast   And  . cholecalciferol  800 Units Oral Q breakfast  . Chlorhexidine Gluconate Cloth  6 each Topical Daily  . docusate sodium  100 mg Oral BID  . famotidine  20 mg Oral QHS  . feeding supplement (ENSURE ENLIVE)  237 mL Oral Q24H  . furosemide  80 mg Oral Daily  . iron polysaccharides  150 mg Oral BID  . levothyroxine  25 mcg Oral Daily  . memantine  10 mg Oral BID  . metoprolol tartrate  25 mg Oral BID  . multivitamin with minerals  1 tablet Oral Daily  . pantoprazole  40 mg Oral BID  . potassium chloride  20 mEq Oral BID  . traMADol  50 mg Oral Q6H   Continuous Infusions: . sodium chloride Stopped (03/10/20 0752)  . sodium chloride 75 mL/hr at 03/12/20 1448  . lactated ringers 100 mL/hr at 03/10/20 0900  . methocarbamol (ROBAXIN) IV    . methocarbamol (ROBAXIN) IV       LOS: 3 days    Time spent: 30 minutes    Barton Dubois, MD [facility]   To contact the attending provider  between 7A-7P or the covering provider during after hours 7P-7A, please log into the web site [email or web address] and access using universal [place] password for that web site. If you do not have the password, please call the hospital operator.  03/12/2020, 5:09 PM

## 2020-03-12 NOTE — TOC Progression Note (Signed)
Transition of Care [facility]) - Progression Note    Patient Details  Name: LILLYANA MAJETTE MRN: [id number] Date of Birth: [birth year]  [facility]) CM/SW Contact  Natasha Bence, LCSW Phone Number: 03/12/2020, 9:56 AM  Clinical Narrative:    CSW received consult for SNF. CSW contacted patient's sister for SNF referrals. Patient's sister requested a referral to [facility] and [place] inquired about agreeableness to additional referrals. Patient's sister reported that she was unsure of additional SNFs. CSW provided the names of SNF's in [facility] and inquired about referrals for additional counties. Patient's sister reported that she would only like to place referrals to the SNF's she had become familiar with at the time. TOC to follow.    Expected Discharge Plan: Skilled Nursing Facility Barriers to Discharge: Continued Medical Work up  Expected Discharge Plan and Services Expected Discharge Plan: [place]     Post Acute Care Choice: [place]                                         Social Determinants of Health (SDOH) Interventions    Readmission Risk Interventions Readmission Risk Prevention Plan 10/11/2019  Transportation Screening Complete  HRI or Home Care Consult Complete  Palliative Care Screening Not Applicable  Medication Review (RN Care Manager) Complete  Some recent data might be hidden

## 2020-03-13 LAB — CBC
HCT: 25.9 % — ABNORMAL LOW (ref 36.0–46.0)
Hemoglobin: 7.9 g/dL — ABNORMAL LOW (ref 12.0–15.0)
MCH: 31.2 pg (ref 26.0–34.0)
MCHC: 30.5 g/dL (ref 30.0–36.0)
MCV: 102.4 fL — ABNORMAL HIGH (ref 80.0–100.0)
Platelets: 200 10*3/uL (ref 150–400)
RBC: 2.53 MIL/uL — ABNORMAL LOW (ref 3.87–5.11)
RDW: 14.4 % (ref 11.5–15.5)
WBC: 9.1 10*3/uL (ref 4.0–10.5)
nRBC: 0 % (ref 0.0–0.2)

## 2020-03-13 MED ORDER — HYDROCODONE-ACETAMINOPHEN 5-325 MG PO TABS
1.0000 | ORAL_TABLET | Freq: Four times a day (QID) | ORAL | Status: DC | PRN
  Administered 2020-03-13: 1 via ORAL
  Filled 2020-03-13: qty 1

## 2020-03-13 NOTE — Progress Notes (Signed)
PROGRESS NOTE    Crystal Cooper  VXB:[id number] DOB: 1927/03/16 DOA: 03/09/2020 PCP: [name], MD    Chief Complaint  Patient presents with  . Leg Pain    Brief Narrative:  As per H&P written by Dr. Waldron Labs 03/09/2020  Crystal Cooper  is a 86 y.o. 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 fib due to GI bleed, patient presents to ED secondary to fall, patient with history of dementia, but she is able to provide some history, as well it was obtained with some assistance from daughter and ED staff, apparently patient had a mechanical fall today, she had one of the care givers with her, where she lost her footing, and fell, she was unable to stand back up, she had severe swelling of left ankle, and unable to move leg into normal position, as well had significant bruising in the left side of the head, so EMS were called, patient herself denies any dizziness or lightheadedness preceding before, she denies any focal deficits, tingling or numbness, no fever or chills, she is vaccinated for Covid x2. - in ED her work-up was significant for potassium 3.4, creatinine at baseline of 1.29, hemoglobin at baseline of 10.7, CT head and cervical spine with no acute findings, left hip x-ray significant for intertrochanteric fracture of proximal femur, Ortho were consulted, plan for surgery tomorrow, [facility] hospitalist consulted to admit.  Assessment & Plan: 1-left hip fracture -Continue supportive care and as needed analgesics. -Orthopedic service (Dr. Aline Brochure) consulted and patient is status post OTIF with gamma implant. -Will follow post operative recommendations by orthopedic service.   -aspirin for DVT prophylaxis for 6 weeks -no hip precautions -weight bearing as tolerated -will need SNF as per eval by PT.  2-Essential hypertension -Slightly elevated in the setting of pain -Continue current antihypertensive regimen and adjust treatment as needed.  3-PAF (paroxysmal atrial fibrillation) (HCC)/junctional bradycardia -Rate control -No longer on anticoagulation secondary to bleeding. -Status post pacemaker implantation. -continue telemetry monitoring   4-Duodenal arteriovenous malformation/care/GI bleed. -Continue PPI.  5-status post TAVR/chronic diastolic dysfunction -Compensated currently -Continue to follow daily weights, low-sodium diet and strict I's and O's. -Continue with home dose Lasix.  6-hypothyroidism -Continue Synthroid.  7-chronic kidney disease a stage IIIb -Appears to be at baseline -Maintain adequate hydration -Follow electrolytes trend closely.  8-COPD (chronic obstructive pulmonary disease) (HCC) -No wheezing no using accessory muscles. -Continue incentive respirometer/flutter valve. -Continue home bronchodilator regimen.  9-Dementia without behavioral disturbance (HCC) -No behavioral disturbances appreciated. -Continue the use of Namenda -Continue supportive care.  10-acute on chronic anemia: Patient with chronic anemia secondary to chronic kidney disease and now with acute postoperative bleeding anemia. -Will closely follow hemoglobin trend and transfuse for hemoglobin less than 7 -Niferex twice daily has been started. -Hemoglobin 7.9 -Continue monitoring trend.   DVT prophylaxis: SCD's Code Status: Full code Family Communication: Daughter at bedside Disposition:   Status is: Inpatient   Dispo: The patient is from: home              Anticipated d/c is to: To be determined              Anticipated d/c date is: 2-3 days; could be longer based on potential need for skilled nursing  facility placement for rehab.               Patient currently is medically stable for discharge to a skilled nursing facility for further rehabilitation.  Decreased in patient's hemoglobin level has remained stable and no transfusion required. Will continue to closely follow CBC, TOC to assist with discharge, follow postoperative  recommendations by orthopedic service.    Consultants:   Orthopedic service.  Procedures:  OTIF left hip with gamma nail (03/10/2020)   Antimicrobials:  None   Subjective: No fever, no chest pain, no nausea, no vomiting.  Weak, deconditioned and reporting some decrease in her appetite.  Objective: Vitals:   03/12/20 2053 03/13/20 0458 03/13/20 0809 03/13/20 1448  BP: (!) 109/47 (!) 157/64  122/61  Pulse: (!) 57 62  61  Resp: 20 20  20   Temp: 97.6 F (36.4 C) 98 F (36.7 C)  97.7 F (36.5 C)  TempSrc: Oral Oral    SpO2: 96% 92% (!) 86% 100%  Weight:      Height:        Intake/Output Summary (Last 24 hours) at 03/13/2020 1732 Last data filed at 03/13/2020 1529 Gross per 24 hour  Intake --  Output 400 ml  Net -400 ml   Filed Weights   03/09/20 1325 03/10/20 1050 03/10/20 1602  Weight: 51 kg 51 kg 56.2 kg    Examination: General exam: Alert, awake, oriented x 2; sleepy but easily aroused.  Some decrease in her appetite and feeling weak and deconditioned.  No chest pain, no nausea, no vomiting.  In no major distress while resting. Respiratory system: No crackles, no wheezing, good air movement bilaterally.  No using accessory muscles Cardiovascular system: Rate controlled, no rubs, no gallops, no JVD. Gastrointestinal system: Abdomen is nondistended, soft and nontender. No organomegaly or masses felt. Normal bowel sounds heard. Central nervous system: Alert and oriented. No focal neurological deficits. Extremities: No cyanosis or clubbing. Skin: No rashes, no petechiae; patient with left forehead bruises healing appropriately.  Left hip wound clean, dry and intact. Psychiatry: Mood &  affect appropriate.    Data Reviewed: I have personally reviewed following labs and imaging studies  CBC: Recent Labs  Lab 03/09/20 1353 03/10/20 0602 03/11/20 0809 03/12/20 0616 03/13/20 0754  WBC 6.0 7.8 6.7 11.0* 9.1  NEUTROABS 4.9  --   --   --   --   HGB 10.7* 9.5* 7.6* 8.0* 7.9*  HCT 34.8* 30.9* 25.4* 25.8* 25.9*  MCV 100.6* 100.7* 101.2* 101.2* 102.4*  PLT 200 190 148* 170 332    Basic Metabolic Panel: Recent Labs  Lab 03/09/20 1353 03/10/20 0602  NA 135 134*  K 3.4* 4.5  CL 93* 97*  CO2 30 25  GLUCOSE 137* 124*  BUN 22 23  CREATININE 1.29* 1.24*  CALCIUM 9.0 9.0    GFR: Estimated Creatinine Clearance: 23.4 mL/min (A) (by C-G formula based on SCr of 1.24 mg/dL (H)).   Recent Results (from the past 240 hour(s))  SARS Coronavirus 2 by RT PCR (hospital order, performed in [facility] hospital lab) Nasopharyngeal Nasopharyngeal Swab     Status: None   Collection Time: 03/09/20  4:55 PM   Specimen: Nasopharyngeal Swab  Result Value Ref Range Status   SARS Coronavirus 2 NEGATIVE NEGATIVE Final    Comment: (NOTE) SARS-CoV-2 target nucleic acids are NOT DETECTED.  The SARS-CoV-2 RNA is generally detectable in upper and lower respiratory specimens during the acute phase of infection. The lowest concentration of  SARS-CoV-2 viral copies this assay can detect is 250 copies / mL. A negative result does not preclude SARS-CoV-2 infection and should not be used as the sole basis for treatment or other patient management decisions.  A negative result may occur with improper specimen collection / handling, submission of specimen other than nasopharyngeal swab, presence of viral mutation(s) within the areas targeted by this assay, and inadequate number of viral copies (<250 copies / mL). A negative result must be combined with clinical observations, patient history, and epidemiological information.  Fact Sheet for Patients:    StrictlyIdeas.no  Fact Sheet for Healthcare Providers: BankingDealers.co.za  This test is not yet approved or  cleared by the [place] FDA and has been authorized for detection and/or diagnosis of SARS-CoV-2 by FDA under an Emergency Use Authorization (EUA).  This EUA will remain in effect (meaning this test can be used) for the duration of the COVID-19 declaration under Section 564(b)(1) of the Act, 21 U.S.C. section 360bbb-3(b)(1), unless the authorization is terminated or revoked sooner.  Performed at [facility], [address], [place], [place] [address]      Radiology Studies: No results found.  Scheduled Meds: . umeclidinium bromide  1 puff Inhalation Daily   And  . arformoterol  15 mcg Nebulization BID  . aspirin EC  81 mg Oral Daily  . budesonide (PULMICORT) nebulizer solution  0.25 mg Nebulization BID  . calcium carbonate  1 tablet Oral Q breakfast   And  . cholecalciferol  800 Units Oral Q breakfast  . Chlorhexidine Gluconate Cloth  6 each Topical Daily  . docusate sodium  100 mg Oral BID  . famotidine  20 mg Oral QHS  . feeding supplement (ENSURE ENLIVE)  237 mL Oral Q24H  . furosemide  80 mg Oral Daily  . iron polysaccharides  150 mg Oral BID  . levothyroxine  25 mcg Oral Daily  . memantine  10 mg Oral BID  . metoprolol tartrate  25 mg Oral BID  . multivitamin with minerals  1 tablet Oral Daily  . pantoprazole  40 mg Oral BID  . potassium chloride  20 mEq Oral BID  . traMADol  50 mg Oral Q6H   Continuous Infusions: . sodium chloride Stopped (03/10/20 0752)  . sodium chloride 75 mL/hr at 03/13/20 0828  . lactated ringers 100 mL/hr at 03/10/20 0900  . methocarbamol (ROBAXIN) IV       LOS: 4 days    Time spent: 30 minutes    Barton Dubois, MD [facility]   To contact the attending provider between 7A-7P or the covering provider during after hours 7P-7A, please log into the web site  [email or web address] and access using universal [place] password for that web site. If you do not have the password, please call the hospital operator.  03/13/2020, 5:32 PM

## 2020-03-14 ENCOUNTER — Telehealth: Payer: Self-pay | Admitting: Neurology

## 2020-03-14 NOTE — TOC Progression Note (Signed)
Transition of Care [facility]) - Progression Note    Patient Details  Name: Crystal Cooper MRN: [id number] Date of Birth: October 13, 1926  Transition of Care [facility]) CM/SW [place], [place] Phone Number: 03/14/2020, 4:17 PM  Clinical Narrative:    TOC confirmed that there was a bed offer made at [facility] with Mardene Celeste. There will be a bed ready tomorrow 9/21 for pts expected D/C. CSW spoke with pts daughter [name] to confirm acceptance of bed offer. TOC will follow up with pts family and [facility] at D/C. TOC to follow.    Expected Discharge Plan: Skilled Nursing Facility Barriers to Discharge: Continued Medical Work up  Expected Discharge Plan and Services Expected Discharge Plan: [place]     Post Acute Care Choice: [place]                                         Social Determinants of Health (SDOH) Interventions    Readmission Risk Interventions Readmission Risk Prevention Plan 10/11/2019  Transportation Screening Complete  HRI or Home Care Consult Complete  Palliative Care Screening Not Applicable  Medication Review (RN Care Manager) Complete  Some recent data might be hidden

## 2020-03-14 NOTE — Telephone Encounter (Signed)
Pt's daughter([place],[name]) states there is a code that needs changing for Medicare to pay bill

## 2020-03-14 NOTE — Progress Notes (Signed)
PROGRESS NOTE    Crystal Cooper  PPJ:[id number] DOB: 1927-03-26 DOA: 03/09/2020 PCP: [name], MD    Chief Complaint  Patient presents with   Leg Pain    Brief Narrative:  As per H&P written by Dr. Waldron Labs 03/09/2020  Crystal Cooper  is a 86 y.o. 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 fib due to GI bleed, patient presents to ED secondary to fall, patient with history of dementia, but she is able to provide some history, as well it was obtained with some assistance from daughter and ED staff, apparently patient had a mechanical fall today, she had one of the care givers with her, where she lost her footing, and fell, she was unable to stand back up, she had severe swelling of left ankle, and unable to move leg into normal position, as well had significant bruising in the left side of the head, so EMS were called, patient herself denies any dizziness or lightheadedness preceding before, she denies any focal deficits, tingling or numbness, no fever or chills, she is vaccinated for Covid x2. - in ED her work-up was significant for potassium 3.4, creatinine at baseline of 1.29, hemoglobin at baseline of 10.7, CT head and cervical spine with no acute findings, left hip x-ray significant for intertrochanteric fracture of proximal femur, Ortho were consulted, plan for surgery tomorrow, [facility] hospitalist consulted to admit.  Assessment & Plan: 1-left hip fracture -Continue supportive care and as needed analgesics. -Orthopedic service (Dr. Aline Brochure) consulted and patient is status post OTIF with gamma implant. -Will follow post operative recommendations by orthopedic service.   -aspirin for DVT prophylaxis for 6 weeks -no hip precautions -weight bearing as tolerated -will need SNF as per eval by PT.  2-Essential hypertension -Slightly elevated in the setting of pain -Continue current antihypertensive regimen and adjust treatment as needed.  3-PAF (paroxysmal atrial fibrillation) (HCC)/junctional bradycardia -Rate control -No longer on anticoagulation secondary to bleeding. -Status post pacemaker implantation. -continue telemetry monitoring   4-Duodenal arteriovenous malformation/care/GI bleed. -Continue PPI.  5-status post TAVR/chronic diastolic dysfunction -Compensated currently -Continue to follow daily weights, low-sodium diet and strict I's and O's. -Continue with home dose Lasix.  6-hypothyroidism -Continue Synthroid.  7-chronic kidney disease a stage IIIb -Appears to be at baseline -Maintain adequate hydration -Follow electrolytes trend closely.  8-COPD (chronic obstructive pulmonary disease) (HCC) -No wheezing no using accessory muscles. -Continue incentive respirometer/flutter valve. -Continue home bronchodilator regimen.  9-Dementia without behavioral disturbance (HCC) -No behavioral disturbances appreciated. -Continue the use of Namenda -Continue supportive care.  10-acute on chronic anemia: Patient with chronic anemia secondary to chronic kidney disease and now with acute postoperative bleeding anemia. -Will closely follow hemoglobin trend and transfuse for hemoglobin less than 7 -Niferex twice daily has been started. -Last hemoglobin 7.9; repeat CBC in a.m.   DVT prophylaxis: SCD's Code Status: Full code Family Communication: Daughter at bedside Disposition:   Status is: Inpatient   Dispo: The patient is from: home              Anticipated d/c is to: To be determined              Anticipated d/c date is: 2-3 days; could be longer based on potential need for  skilled nursing facility placement for rehab.               Patient currently is medically stable for discharge to a skilled nursing facility for further rehabilitation.  Decreased in patient's hemoglobin level has remained stable and no transfusion required. Will continue to closely follow CBC, TOC to assist with discharge, follow postoperative  recommendations by orthopedic service.    Consultants:   Orthopedic service.  Procedures:  OTIF left hip with gamma nail (03/10/2020)   Antimicrobials:  None   Subjective: No fever, no chest pain, no nausea, no vomiting; awake, deconditioning and having some pain in her left hip with movements.  Objective: Vitals:   03/13/20 1943 03/14/20 0444 03/14/20 0734 03/14/20 0831  BP:  (!) 145/64  (!) 153/68  Pulse:  61  63  Resp:  18  19  Temp:  98.6 F (37 C)  98 F (36.7 C)  TempSrc:    Oral  SpO2: 98% 99% 98% 100%  Weight:      Height:        Intake/Output Summary (Last 24 hours) at 03/14/2020 1333 Last data filed at 03/14/2020 0451 Gross per 24 hour  Intake --  Output 850 ml  Net -850 ml   Filed Weights   03/09/20 1325 03/10/20 1050 03/10/20 1602  Weight: 51 kg 51 kg 56.2 kg    Examination: General exam: Alert, awake, oriented x 2, hard of hearing, but appropriate.  No headaches, no chest pain, no shortness of breath sensation.  Still having pain in her left hip with movement. Respiratory system: Clear to auscultation. Respiratory effort normal.  No using accessory muscles. Cardiovascular system: Rate controlled, no rubs, no gallops, no JVD. Gastrointestinal system: Abdomen is nondistended, soft and nontender. No organomegaly or masses felt. Normal bowel sounds heard. Central nervous system: Alert and oriented. No focal neurological deficits. Extremities: No cyanosis or clubbing. Skin: No rashes, no petechiae; left forehead bruises healing appropriately.  Left hip wound is clean, dry and intact. Psychiatry: Mood & affect appropriate.    Data Reviewed: I have personally reviewed  following labs and imaging studies  CBC: Recent Labs  Lab 03/09/20 1353 03/10/20 0602 03/11/20 0809 03/12/20 0616 03/13/20 0754  WBC 6.0 7.8 6.7 11.0* 9.1  NEUTROABS 4.9  --   --   --   --   HGB 10.7* 9.5* 7.6* 8.0* 7.9*  HCT 34.8* 30.9* 25.4* 25.8* 25.9*  MCV 100.6* 100.7* 101.2* 101.2* 102.4*  PLT 200 190 148* 170 188    Basic Metabolic Panel: Recent Labs  Lab 03/09/20 1353 03/10/20 0602  NA 135 134*  K 3.4* 4.5  CL 93* 97*  CO2 30 25  GLUCOSE 137* 124*  BUN 22 23  CREATININE 1.29* 1.24*  CALCIUM 9.0 9.0    GFR: Estimated Creatinine Clearance: 23.4 mL/min (A) (by C-G formula based on SCr of 1.24 mg/dL (H)).   Recent Results (from the past 240 hour(s))  SARS Coronavirus 2 by RT PCR (hospital order, performed in [facility] hospital lab) Nasopharyngeal Nasopharyngeal Swab     Status: None   Collection Time: 03/09/20  4:55 PM   Specimen: Nasopharyngeal Swab  Result Value Ref Range Status   SARS Coronavirus 2 NEGATIVE NEGATIVE Final    Comment: (NOTE) SARS-CoV-2 target nucleic acids are NOT DETECTED.  The SARS-CoV-2 RNA is generally detectable in upper and lower respiratory specimens during the acute phase of infection. The lowest concentration of SARS-CoV-2 viral copies this assay can detect is  250 copies / mL. A negative result does not preclude SARS-CoV-2 infection and should not be used as the sole basis for treatment or other patient management decisions.  A negative result may occur with improper specimen collection / handling, submission of specimen other than nasopharyngeal swab, presence of viral mutation(s) within the areas targeted by this assay, and inadequate number of viral copies (<250 copies / mL). A negative result must be combined with clinical observations, patient history, and epidemiological information.  Fact Sheet for Patients:   StrictlyIdeas.no  Fact Sheet for Healthcare  Providers: BankingDealers.co.za  This test is not yet approved or  cleared by the [place] FDA and has been authorized for detection and/or diagnosis of SARS-CoV-2 by FDA under an Emergency Use Authorization (EUA).  This EUA will remain in effect (meaning this test can be used) for the duration of the COVID-19 declaration under Section 564(b)(1) of the Act, 21 U.S.C. section 360bbb-3(b)(1), unless the authorization is terminated or revoked sooner.  Performed at [facility], [address], [place], [place] [address]      Radiology Studies: No results found.  Scheduled Meds:  umeclidinium bromide  1 puff Inhalation Daily   And   arformoterol  15 mcg Nebulization BID   aspirin EC  81 mg Oral Daily   budesonide (PULMICORT) nebulizer solution  0.25 mg Nebulization BID   calcium carbonate  1 tablet Oral Q breakfast   And   cholecalciferol  800 Units Oral Q breakfast   Chlorhexidine Gluconate Cloth  6 each Topical Daily   docusate sodium  100 mg Oral BID   famotidine  20 mg Oral QHS   feeding supplement (ENSURE ENLIVE)  237 mL Oral Q24H   furosemide  80 mg Oral Daily   iron polysaccharides  150 mg Oral BID   levothyroxine  25 mcg Oral Daily   memantine  10 mg Oral BID   metoprolol tartrate  25 mg Oral BID   multivitamin with minerals  1 tablet Oral Daily   pantoprazole  40 mg Oral BID   potassium chloride  20 mEq Oral BID   traMADol  50 mg Oral Q6H   Continuous Infusions:  sodium chloride Stopped (03/10/20 0752)   sodium chloride 50 mL/hr at 03/13/20 1823   lactated ringers 100 mL/hr at 03/10/20 0900   methocarbamol (ROBAXIN) IV       LOS: 5 days    Time spent: 30 minutes    Barton Dubois, MD [facility]   To contact the attending provider between 7A-7P or the covering provider during after hours 7P-7A, please log into the web site [email or web address] and access using universal [place] password for that web  site. If you do not have the password, please call the hospital operator.  03/14/2020, 1:33 PM

## 2020-03-15 ENCOUNTER — Encounter: Payer: Self-pay | Admitting: Orthopedic Surgery

## 2020-03-15 DIAGNOSIS — D62 Acute posthemorrhagic anemia: Secondary | ICD-10-CM | POA: Diagnosis not present

## 2020-03-15 DIAGNOSIS — I130 Hypertensive heart and chronic kidney disease with heart failure and stage 1 through stage 4 chronic kidney disease, or unspecified chronic kidney disease: Secondary | ICD-10-CM | POA: Diagnosis not present

## 2020-03-15 DIAGNOSIS — J189 Pneumonia, unspecified organism: Secondary | ICD-10-CM | POA: Diagnosis not present

## 2020-03-15 DIAGNOSIS — R279 Unspecified lack of coordination: Secondary | ICD-10-CM | POA: Diagnosis not present

## 2020-03-15 DIAGNOSIS — Z950 Presence of cardiac pacemaker: Secondary | ICD-10-CM | POA: Diagnosis not present

## 2020-03-15 DIAGNOSIS — D649 Anemia, unspecified: Secondary | ICD-10-CM | POA: Diagnosis not present

## 2020-03-15 DIAGNOSIS — Z9889 Other specified postprocedural states: Secondary | ICD-10-CM | POA: Diagnosis not present

## 2020-03-15 DIAGNOSIS — Z23 Encounter for immunization: Secondary | ICD-10-CM | POA: Diagnosis not present

## 2020-03-15 DIAGNOSIS — N183 Chronic kidney disease, stage 3 unspecified: Secondary | ICD-10-CM | POA: Diagnosis not present

## 2020-03-15 DIAGNOSIS — S72002D Fracture of unspecified part of neck of left femur, subsequent encounter for closed fracture with routine healing: Secondary | ICD-10-CM | POA: Diagnosis not present

## 2020-03-15 DIAGNOSIS — J439 Emphysema, unspecified: Secondary | ICD-10-CM | POA: Diagnosis not present

## 2020-03-15 DIAGNOSIS — F0390 Unspecified dementia without behavioral disturbance: Secondary | ICD-10-CM | POA: Diagnosis not present

## 2020-03-15 DIAGNOSIS — K219 Gastro-esophageal reflux disease without esophagitis: Secondary | ICD-10-CM | POA: Diagnosis not present

## 2020-03-15 DIAGNOSIS — F0150 Vascular dementia without behavioral disturbance: Secondary | ICD-10-CM | POA: Diagnosis not present

## 2020-03-15 DIAGNOSIS — M80852A Other osteoporosis with current pathological fracture, left femur, initial encounter for fracture: Secondary | ICD-10-CM | POA: Diagnosis not present

## 2020-03-15 DIAGNOSIS — R41841 Cognitive communication deficit: Secondary | ICD-10-CM | POA: Diagnosis not present

## 2020-03-15 DIAGNOSIS — I10 Essential (primary) hypertension: Secondary | ICD-10-CM | POA: Diagnosis not present

## 2020-03-15 DIAGNOSIS — E876 Hypokalemia: Secondary | ICD-10-CM | POA: Diagnosis not present

## 2020-03-15 DIAGNOSIS — N1832 Chronic kidney disease, stage 3b: Secondary | ICD-10-CM | POA: Diagnosis not present

## 2020-03-15 DIAGNOSIS — S72002A Fracture of unspecified part of neck of left femur, initial encounter for closed fracture: Secondary | ICD-10-CM | POA: Diagnosis not present

## 2020-03-15 DIAGNOSIS — M6281 Muscle weakness (generalized): Secondary | ICD-10-CM | POA: Diagnosis not present

## 2020-03-15 DIAGNOSIS — R062 Wheezing: Secondary | ICD-10-CM | POA: Diagnosis not present

## 2020-03-15 DIAGNOSIS — I5030 Unspecified diastolic (congestive) heart failure: Secondary | ICD-10-CM | POA: Diagnosis not present

## 2020-03-15 DIAGNOSIS — E039 Hypothyroidism, unspecified: Secondary | ICD-10-CM | POA: Diagnosis not present

## 2020-03-15 DIAGNOSIS — Z9181 History of falling: Secondary | ICD-10-CM | POA: Diagnosis not present

## 2020-03-15 DIAGNOSIS — I5032 Chronic diastolic (congestive) heart failure: Secondary | ICD-10-CM | POA: Diagnosis not present

## 2020-03-15 DIAGNOSIS — Z8719 Personal history of other diseases of the digestive system: Secondary | ICD-10-CM | POA: Diagnosis not present

## 2020-03-15 DIAGNOSIS — Z952 Presence of prosthetic heart valve: Secondary | ICD-10-CM | POA: Diagnosis not present

## 2020-03-15 DIAGNOSIS — K31819 Angiodysplasia of stomach and duodenum without bleeding: Secondary | ICD-10-CM | POA: Diagnosis not present

## 2020-03-15 DIAGNOSIS — Z7401 Bed confinement status: Secondary | ICD-10-CM | POA: Diagnosis not present

## 2020-03-15 DIAGNOSIS — D631 Anemia in chronic kidney disease: Secondary | ICD-10-CM | POA: Diagnosis not present

## 2020-03-15 DIAGNOSIS — R2689 Other abnormalities of gait and mobility: Secondary | ICD-10-CM | POA: Diagnosis not present

## 2020-03-15 DIAGNOSIS — I480 Paroxysmal atrial fibrillation: Secondary | ICD-10-CM | POA: Diagnosis not present

## 2020-03-15 DIAGNOSIS — J449 Chronic obstructive pulmonary disease, unspecified: Secondary | ICD-10-CM | POA: Diagnosis not present

## 2020-03-15 LAB — SARS CORONAVIRUS 2 BY RT PCR (HOSPITAL ORDER, PERFORMED IN ~~LOC~~ HOSPITAL LAB): SARS Coronavirus 2: NEGATIVE

## 2020-03-15 LAB — CBC
HCT: 24.9 % — ABNORMAL LOW (ref 36.0–46.0)
Hemoglobin: 7.8 g/dL — ABNORMAL LOW (ref 12.0–15.0)
MCH: 31 pg (ref 26.0–34.0)
MCHC: 31.3 g/dL (ref 30.0–36.0)
MCV: 98.8 fL (ref 80.0–100.0)
Platelets: 212 10*3/uL (ref 150–400)
RBC: 2.52 MIL/uL — ABNORMAL LOW (ref 3.87–5.11)
RDW: 14.4 % (ref 11.5–15.5)
WBC: 7 10*3/uL (ref 4.0–10.5)
nRBC: 0 % (ref 0.0–0.2)

## 2020-03-15 MED ORDER — DOCUSATE SODIUM 100 MG PO CAPS: 100.0000 mg | ORAL_CAPSULE | Freq: Two times a day (BID) | ORAL | Status: DC

## 2020-03-15 MED ORDER — POLYSACCHARIDE IRON COMPLEX 150 MG PO CAPS: 150.0000 mg | ORAL_CAPSULE | Freq: Two times a day (BID) | ORAL | Status: DC

## 2020-03-15 MED ORDER — ASPIRIN 81 MG PO TBEC: 81.0000 mg | DELAYED_RELEASE_TABLET | Freq: Every day | ORAL | Status: AC

## 2020-03-15 MED ORDER — ENSURE ENLIVE PO LIQD: 237.0000 mL | ORAL | Status: DC

## 2020-03-15 MED ORDER — TRAMADOL HCL 50 MG PO TABS
50.0000 mg | ORAL_TABLET | Freq: Three times a day (TID) | ORAL | 0 refills | Status: DC | PRN
Start: 2020-03-15 — End: 2020-06-22

## 2020-03-15 NOTE — TOC Transition Note (Signed)
Transition of Care [facility]) - CM/SW Discharge Note   Patient Details  Name: Crystal Cooper MRN: [id number] Date of Birth: [birth year]  [facility]) CM/SW Contact:  Boneta Lucks, RN Phone Number: 03/15/2020, 11:34 AM   Clinical Narrative:   Patient is medically ready to transport to [facility]. Bed confirmed with Mardene Celeste. RN and MD updated patient needs new COVID test. TOC updated [name] - daughter.  Fill out medical necessity.  Will send clinicals in hub when completed.     Final next level of care: Skilled Nursing Facility Barriers to Discharge: Barriers Resolved   Patient Goals and CMS Choice Patient states their goals for this hospitalization and ongoing recovery are:: Go to SNF. CMS Medicare.gov Compare Post Acute Care list provided to:: Patient Choice offered to / list presented to : Patient  Discharge Placement              Patient chooses bed at: Other - please specify in the comment section below: ([facility]) Patient to be transferred to facility by: EMS Name of family member notified: [name] Patient and family notified of of transfer: 03/15/20  Discharge Plan and Services     Post Acute Care Choice: [facility]               Readmission Risk Interventions Readmission Risk Prevention Plan 10/11/2019  Transportation Screening Complete  HRI or Home Care Consult Complete  Palliative Care Screening Not Applicable  Medication Review (RN Care Manager) Complete  Some recent data might be hidden

## 2020-03-15 NOTE — Progress Notes (Signed)
Crystal Perla, MD  Carole Civil, MD Has had problems with recurrent GI blood loss and anemia on anticoagulation; ok to take ASA 81 mg daily but would DC once time frame of DVT prevention elapsed  [name]

## 2020-03-15 NOTE — Plan of Care (Signed)

## 2020-03-15 NOTE — Discharge Summary (Signed)
Physician Discharge Summary  LEEANDRA ELLERSON OFB:[id number] DOB: [birth year] DOA: 03/09/2020  PCP: [name], MD  Admit date: 03/09/2020 Discharge date: 03/15/2020  Time spent: 35 minutes  Recommendations for Outpatient Follow-up:  1. Repeat CBC in 1 week to follow hemoglobin and stability 2. Repeat basic metabolic panel to evaluate lites renal function in 1 week. 3. Follow-up with orthopedic service in 4 weeks  Discharge Diagnoses:  Active Problems:   Essential hypertension   PAF (paroxysmal atrial fibrillation) (HCC)   Duodenal arteriovenous malformation   GI bleed   S/P TAVR (transcatheter aortic valve replacement)   Diastolic dysfunction   Congestive heart failure (HCC)   AVM (arteriovenous malformation)   COPD (chronic obstructive pulmonary disease) (HCC)   Dementia without behavioral disturbance (HCC)   CKD (chronic kidney disease) stage 3, GFR 30-59 ml/min   Closed left hip fracture, initial encounter ([place])   Status post hip surgery   Discharge Condition: Stable and improved.  Discharged home with instruction to follow-up with orthopedic service in 4 weeks and with PCP in 2 weeks after discharge from the skilled nursing facility.  CODE STATUS: Full code.  Diet recommendation: Heart healthy diet.  Filed Weights   03/09/20 1325 03/10/20 1050 03/10/20 1602  Weight: 51 kg 51 kg 56.2 kg    History of present illness:  As per H&P written by Dr. 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 fib due to GI bleed, patient presents to ED secondary to fall, patient with history of dementia, but she is able to provide  some history, as well it was obtained with some assistance from daughter and ED staff, apparently patient had a mechanical fall today, she had one of the care givers with her, where she lost her footing, and fell, she was unable to stand back up, she had severe swelling of left ankle, and unable to move leg into normal position, as well had significant bruising in the left side of the head, so EMS were called, patient herself denies any dizziness or lightheadedness preceding before, she denies any focal deficits, tingling or numbness, no fever or chills, she is vaccinated for Covid x2. - in EDher work-up was significant for potassium 3.4, creatinine at baseline of 1.29, hemoglobin at baseline of 10.7, CT head and cervical spine with no acute findings, left hip x-ray significant for intertrochanteric fracture of proximal femur, Ortho were consulted, plan for surgery tomorrow, [facility] hospitalist consulted to admit.   Hospital Course:  1-left hip fracture -Continue supportive care and as needed analgesics. -Orthopedic service (Dr. Aline Brochure) consulted and patient is status post OTIF with gamma implant. -Will follow post operative recommendations by orthopedic service.  -Follow-up at the office in 4 weeks for repeat x-rays. -aspirin for DVT prophylaxis for 6 weeks -No hip precautions with movements. -Continue weight bearing as tolerated -will need SNF for rehab and conditioning as per eval by PT; patient and family in agreement.  2-Essential hypertension -Slightly elevated in the setting of pain at time of admission. -Improved and well-controlled currently -Continue current antihypertensive regimen.  3-PAF (paroxysmal atrial fibrillation) (HCC)/junctional bradycardia -Rate controlled -No longer on anticoagulation secondary to bleeding. -Status post pacemaker implantation.  4-Duodenal arteriovenous malformation/care/GI bleed/gastroesophageal reflux disease.. -Continue PPI and nightly  famotidine.[name]  5-status post TAVR/chronic diastolic dysfunction/pacemaker implantation. -Compensated currently -Continue to follow daily weights, low-sodium diet and strict I's and O's. -Continue with home dose Lasix. -No crackles on examination; no signs of fluid overload.  6-hypothyroidism -Continue Synthroid.  7-chronic kidney disease a stage IIIb -Appears to be at baseline -Maintain adequate hydration -Repeat basic metabolic panel in 1 week to follow renal function and electrolytes stability.  8-COPD (chronic obstructive pulmonary disease) (HCC) -No wheezing no using accessory muscles. -Continue incentive respirometer/flutter valve. -Continue home bronchodilator regimen.  9-Dementia without behavioral disturbance (HCC) -No behavioral disturbances appreciated. -Continue the use of Namenda -Continue supportive care.  10-acute on chronic anemia: Patient with chronic anemia secondary to chronic kidney disease and now with acute postoperative bleeding anemia. -No chest pain, no palpitations, no shortness of breath.  Patient denies lightheadedness or any other symptoms. -Niferex twice daily has been started. -Last hemoglobin 7.8--7.9 at time of discharge. -Would recommend repeat CBC in 1 week to further assess hemoglobin and stability.   -Transfusion threshold less than 7; or been symptomatic.  Procedures:  See below for x-ray report  Status post left hip OTIF with gamma implant.  Consultations:  Orthopedic service  Discharge Exam: Vitals:   03/15/20 0848 03/15/20 0850  BP:    Pulse:    Resp:    Temp:    SpO2: 100% 100%   General exam: Alert, awake, oriented x 2, hard of hearing, but appropriate.  No headaches, no chest pain, no shortness of breath sensation.  Still having pain in her left hip with movement. Respiratory system: Clear to auscultation. Respiratory effort normal.  No using accessory muscles. Cardiovascular system: Rate controlled, no rubs, no  gallops, no JVD. Gastrointestinal system: Abdomen is nondistended, soft and nontender. No organomegaly or masses felt. Normal bowel sounds heard. Central nervous system: Alert and oriented. No focal neurological deficits. Extremities: No cyanosis or clubbing. Skin: No rashes, no petechiae; left forehead bruises healing appropriately.  Left hip wound is clean, dry and intact. Psychiatry: Mood & affect appropriate.   Discharge Instructions   Discharge Instructions    (HEART FAILURE PATIENTS) Call MD:  Anytime you have any of the following symptoms: 1) 3 pound weight gain in 24 hours or 5 pounds in 1 week 2) shortness of breath, with or without a dry hacking cough 3) swelling in the hands, feet or stomach 4) if you have to sleep on extra pillows at night in order to breathe.   Complete by: As directed    Diet - low sodium heart healthy   Complete by: As directed    Discharge instructions   Complete by: As directed    Repeat CBC in 5 days to follow hemoglobin trend Repeat basic metabolic panel to follow lites renal function Take medications as prescribed Maintain adequate hydration Physical rehabilitation and conditioning as per the skilled nursing facility protocol Follow-up with orthopedic service as instructed.   Discharge wound care:   Complete by: As directed    Maintain area clean and dry; reinforce dressing as needed until discontinued. Patient with reabsorbable staples.   Increase activity slowly   Complete by: As directed      Allergies as of 03/15/2020      Reactions   Hctz [hydrochlorothiazide] Other (See Comments)   Pt was ill and this affected her kidneys   Aspirin Other (See Comments)   Cardiologist said the patient is to not take this   Codeine Other (See Comments)   Made the patient feel  ill, has not had any problems since 1977      Medication List    STOP taking these medications   ferrous sulfate 325 (65 FE) MG tablet   multivitamin-iron-minerals-folic acid  chewable tablet     TAKE these medications   albuterol 108 (90 Base) MCG/ACT inhaler Commonly known as: ProAir HFA Inhale 2 puffs into the lungs every 4 (four) hours as needed for wheezing or shortness of breath.   aspirin 81 MG EC tablet Take 1 tablet (81 mg total) by mouth daily. Swallow whole. Start taking on: March 16, 2020   Biotin 10 MG Caps Take 10 mg by mouth daily.   Breztri Aerosphere 160-9-4.8 MCG/ACT Aero Generic drug: Budeson-Glycopyrrol-Formoterol Inhale 2 Inhalers into the lungs in the morning and at bedtime.   Caltrate 600+D3 600-800 MG-UNIT Tabs Generic drug: Calcium Carb-Cholecalciferol Take 1 tablet by mouth daily.   docusate sodium 100 MG capsule Commonly known as: COLACE Take 1 capsule (100 mg total) by mouth 2 (two) times daily.   Euthyrox 25 MCG tablet Generic drug: levothyroxine Take 1 tablet by mouth once daily   famotidine 20 MG tablet Commonly known as: Pepcid Take 1 tablet (20 mg total) by mouth at bedtime.   feeding supplement (ENSURE ENLIVE) Liqd Take 237 mLs by mouth daily. Start taking on: March 16, 2020   furosemide 20 MG tablet Commonly known as: LASIX Take 4 tablets (80 mg total) by mouth daily.   iron polysaccharides 150 MG capsule Commonly known as: NIFEREX Take 1 capsule (150 mg total) by mouth 2 (two) times daily.   memantine 10 MG tablet Commonly known as: NAMENDA Take 1 tablet (10 mg total) by mouth 2 (two) times daily.   metoprolol tartrate 25 MG tablet Commonly known as: LOPRESSOR Take 1 tablet (25 mg total) by mouth 2 (two) times daily.   pantoprazole 40 MG tablet Commonly known as: PROTONIX Take 1 tablet (40 mg total) by mouth 2 (two) times daily.   potassium chloride 10 MEQ tablet Commonly known as: KLOR-CON Take 2 tablets (20 mEq total) by mouth 2 (two) times daily. What changed: how much to take   traMADol 50 MG tablet Commonly known as: ULTRAM Take 1 tablet (50 mg total) by mouth every 8 (eight)  hours as needed for severe pain.            Discharge Care Instructions  (From admission, onward)         Start     Ordered   03/15/20 0000  Discharge wound care:       Comments: Maintain area clean and dry; reinforce dressing as needed until discontinued. Patient with reabsorbable staples.   03/15/20 1427         Allergies  Allergen Reactions  . Hctz [Hydrochlorothiazide] Other (See Comments)    Pt was ill and this affected her kidneys   . Aspirin Other (See Comments)    Cardiologist said the patient is to not take this  . Codeine Other (See Comments)    Made the patient feel ill, has not had any problems since 1977    Contact information for follow-up providers    Carole Civil, MD. Schedule an appointment as soon as possible for a visit in 4 week(s).   Specialties: Orthopedic Surgery, Radiology Contact information: [address] [place] [address] [phone]            Contact information for after-discharge care    Destination    [facility] .   Service: Skilled Nursing Contact information: [address] [place] [phone]                  The results of significant diagnostics from this hospitalization (including imaging, microbiology, ancillary and laboratory) are listed below for reference.    Significant Diagnostic Studies: DG Chest 1 View  Result Date: 03/09/2020 CLINICAL DATA:  Fall. EXAM: CHEST  1 VIEW COMPARISON:  February 29, 2020. FINDINGS: Stable cardiomegaly. Status post transcatheter aortic valve repair. Left-sided pacemaker is unchanged in position. No pneumothorax or pleural effusion is noted. Both lungs are clear. The visualized skeletal structures are unremarkable. IMPRESSION: No active disease. Aortic Atherosclerosis (ICD10-I70.0). Electronically Signed   By: Marijo Conception M.D.   On: 03/09/2020 14:37   DG Chest 2  View  Result Date: 02/29/2020 CLINICAL DATA:  Chest pain EXAM: CHEST - 2 VIEW COMPARISON:  Chest x-ray dated 02/12/2020. FINDINGS: Mild cardiomegaly is stable. Lungs are hyperexpanded. Coarse lung markings bilaterally suggests chronic interstitial lung disease/fibrosis. No new lung findings. No confluent opacity to suggest a consolidating pneumonia. No pleural effusion or pneumothorax is seen. LEFT chest wall pacemaker/ICD hardware is stable. Osteopenia limits characterization of osseous detail. Cannot exclude acute compression fracture deformity within the midthoracic spine. IMPRESSION: 1. Osteopenia limits characterization of the thoracic spine. It is difficult to exclude compression fracture within the midthoracic spine. If any recent injury or midline back pain, consider dedicated plain film of the thoracic spine for further characterization. 2. No active cardiopulmonary disease. No evidence of pneumonia or pulmonary edema. 3. Stable mild cardiomegaly. 4. Hyperexpanded lungs indicating COPD. Probable chronic interstitial lung disease/fibrosis. Electronically Signed   By: Franki Cabot M.D.   On: 02/29/2020 13:43   CT HEAD WO CONTRAST  Result Date: 03/09/2020 CLINICAL DATA:  Head trauma, minor. Poly trauma, critical, head/cervical spine injury suspected. Additional history obtained from [place] slid out of bed and hit left side of head, bruising noted to left side of forehead, patient reports left leg pain. EXAM: CT HEAD WITHOUT CONTRAST CT CERVICAL SPINE WITHOUT CONTRAST TECHNIQUE: Multidetector CT imaging of the head and cervical spine was performed following the standard protocol without intravenous contrast. Multiplanar CT image reconstructions of the cervical spine were also generated. COMPARISON:  Brain MRI 12/02/2019.  Head CT 08/25/2019. FINDINGS: CT HEAD FINDINGS Brain: Stable, moderate generalized parenchymal atrophy. Known chronic lacunar infarcts within the right corona  radiata, basal ganglia, thalami and bilateral cerebellar hemispheres, some of which were better appreciated on the MRI of 12/02/2019. There is no acute intracranial hemorrhage. No demarcated cortical infarct. No extra-axial fluid collection. No evidence of intracranial mass. No midline shift. Vascular: No hyperdense vessel.  Atherosclerotic calcifications Skull: Normal. Negative for fracture or focal lesion. Sinuses/Orbits: Visualized orbits show no acute finding. Scattered mild mucosal thickening and frothy secretions within the ethmoid air cells. No significant mastoid effusion. Other: Mild left frontal scalp/forehead soft tissue swelling. CT CERVICAL SPINE FINDINGS Mildly motion degraded examination Alignment: Cervical levocurvature. Straightening of the expected cervical lordosis. No significant spondylolisthesis. Skull base and vertebrae: The basion-dental and atlanto-dental intervals are maintained.No evidence of acute fracture to the cervical spine. Soft tissues and spinal canal: No prevertebral fluid or swelling. No visible canal hematoma. Disc levels: Cervical spondylosis with multilevel disc space narrowing, posterior disc osteophytes, uncovertebral and facet hypertrophy. A prominent C5-C6 posterior disc osteophyte complex contributes to severe bony spinal canal stenosis Upper chest:  No consolidation within the imaged lung apices. No visible pneumothorax. IMPRESSION: CT head: 1. No evidence of acute intracranial abnormality. 2. Mild left frontal scalp/forehead soft tissue swelling. 3. Stable moderate generalized parenchymal atrophy and advanced chronic small vessel ischemic disease with multiple chronic lacunar infarcts as described. 4. Ethmoid sinusitis. CT cervical spine: 1. Mildly motion degraded examination. 2. No evidence of acute fracture to the cervical spine. 3. Cervical spondylosis as described. Most notably, a prominent C5-C6 posterior disc osteophyte complex contributes to severe spinal canal  stenosis at this level, likely with some degree of spinal cord mass effect. Consider cervical spine MRI for further evaluation, as clinically warranted. Electronically Signed   By: Kellie Simmering DO   On: 03/09/2020 14:35   CT CERVICAL SPINE WO CONTRAST  Result Date: 03/09/2020 CLINICAL DATA:  Head trauma, minor. Poly trauma, critical, head/cervical spine injury suspected. Additional history obtained from [place] slid out of bed and hit left side of head, bruising noted to left side of forehead, patient reports left leg pain. EXAM: CT HEAD WITHOUT CONTRAST CT CERVICAL SPINE WITHOUT CONTRAST TECHNIQUE: Multidetector CT imaging of the head and cervical spine was performed following the standard protocol without intravenous contrast. Multiplanar CT image reconstructions of the cervical spine were also generated. COMPARISON:  Brain MRI 12/02/2019.  Head CT 08/25/2019. FINDINGS: CT HEAD FINDINGS Brain: Stable, moderate generalized parenchymal atrophy. Known chronic lacunar infarcts within the right corona radiata, basal ganglia, thalami and bilateral cerebellar hemispheres, some of which were better appreciated on the MRI of 12/02/2019. There is no acute intracranial hemorrhage. No demarcated cortical infarct. No extra-axial fluid collection. No evidence of intracranial mass. No midline shift. Vascular: No hyperdense vessel.  Atherosclerotic calcifications Skull: Normal. Negative for fracture or focal lesion. Sinuses/Orbits: Visualized orbits show no acute finding. Scattered mild mucosal thickening and frothy secretions within the ethmoid air cells. No significant mastoid effusion. Other: Mild left frontal scalp/forehead soft tissue swelling. CT CERVICAL SPINE FINDINGS Mildly motion degraded examination Alignment: Cervical levocurvature. Straightening of the expected cervical lordosis. No significant spondylolisthesis. Skull base and vertebrae: The basion-dental and atlanto-dental intervals  are maintained.No evidence of acute fracture to the cervical spine. Soft tissues and spinal canal: No prevertebral fluid or swelling. No visible canal hematoma. Disc levels: Cervical spondylosis with multilevel disc space narrowing, posterior disc osteophytes, uncovertebral and facet hypertrophy. A prominent C5-C6 posterior disc osteophyte complex contributes to severe bony spinal canal stenosis Upper chest: No consolidation within the imaged lung apices. No visible pneumothorax. IMPRESSION: CT head: 1. No evidence of acute intracranial abnormality. 2. Mild left frontal scalp/forehead soft tissue swelling. 3. Stable moderate generalized parenchymal atrophy and advanced chronic small vessel ischemic disease with multiple chronic lacunar infarcts as described. 4. Ethmoid sinusitis. CT cervical spine: 1. Mildly motion degraded examination. 2. No evidence of acute fracture to the cervical spine. 3. Cervical spondylosis as described. Most notably, a prominent C5-C6 posterior disc osteophyte complex contributes to severe spinal canal stenosis at this level, likely with some degree of spinal cord mass effect. Consider cervical spine MRI for further evaluation, as clinically warranted. Electronically Signed   By: Kellie Simmering DO   On: 03/09/2020 14:35   DG Knee Complete 4 Views Left  Result Date: 03/09/2020 CLINICAL DATA:  Left knee pain after fall. EXAM: LEFT KNEE - COMPLETE 4+ VIEW COMPARISON:  None. FINDINGS: No evidence of fracture, dislocation, or joint effusion. Moderate narrowing of the medial and lateral joint spaces are noted. Moderate patellar spurring is noted.  Vascular calcifications are noted. IMPRESSION: Moderate degenerative joint disease. No acute abnormality seen in the left knee. Electronically Signed   By: Marijo Conception M.D.   On: 03/09/2020 14:40   DG HIP UNILAT WITH PELVIS 1V LEFT  Result Date: 03/10/2020 CLINICAL DATA:  86 year old female status post left femur intertrochanteric fracture,  ORIF. EXAM: DG HIP (WITH OR WITHOUT PELVIS) 1V*L* COMPARISON:  Intraoperative images 13 52 hours today. FINDINGS: Portable AP view at 1515 hours. Proximal left femur ORIF with intramedullary rod, proximal interlocking dynamic hip screw and distal interlocking cortical screw. Substantially improved AP alignment about the intertrochanteric fracture, near anatomic. No new osseous abnormality identified. Postoperative soft tissue gas about the hip. Calcified peripheral vascular disease. IMPRESSION: Proximal left femur ORIF with no adverse features. Electronically Signed   By: Genevie Ann M.D.   On: 03/10/2020 15:28   DG HIP OPERATIVE UNILAT WITH PELVIS LEFT  Result Date: 03/10/2020 CLINICAL DATA:  86 year old female status post left femur intertrochanteric fracture. EXAM: OPERATIVE LEFT HIP (WITH PELVIS IF PERFORMED) 6 VIEWS TECHNIQUE: Fluoroscopic spot image(s) were submitted for interpretation post-operatively. COMPARISON:  Left hip series 03/09/2020. FLUOROSCOPY TIME:  1 minutes 33.2 seconds.  20.68 mGy. FINDINGS: Six intraoperative fluoroscopic spot views of the proximal left femur demonstrate reduction of the intertrochanteric fracture with near anatomic alignment, placement of proximal intramedullary rod with proximal interlocking dynamic hip screw and distal interlocking cortical screw. IMPRESSION: ORIF proximal left femur with no adverse features. Electronically Signed   By: Genevie Ann M.D.   On: 03/10/2020 15:27   DG Hip Unilat With Pelvis 2-3 Views Left  Result Date: 03/09/2020 CLINICAL DATA:  Left hip pain after fall. EXAM: DG HIP (WITH OR WITHOUT PELVIS) 2-3V LEFT COMPARISON:  None. FINDINGS: Moderately angulated and probably comminuted fracture is seen involving the intertrochanteric region of the proximal left femur. Right hip is unremarkable. IMPRESSION: Moderately angulated and probably comminuted intertrochanteric fracture of proximal left femur. Electronically Signed   By: Marijo Conception M.D.   On:  03/09/2020 14:38    Microbiology: Recent Results (from the past 240 hour(s))  SARS Coronavirus 2 by RT PCR (hospital order, performed in [facility] hospital lab) Nasopharyngeal Nasopharyngeal Swab     Status: None   Collection Time: 03/09/20  4:55 PM   Specimen: Nasopharyngeal Swab  Result Value Ref Range Status   SARS Coronavirus 2 NEGATIVE NEGATIVE Final    Comment: (NOTE) SARS-CoV-2 target nucleic acids are NOT DETECTED.  The SARS-CoV-2 RNA is generally detectable in upper and lower respiratory specimens during the acute phase of infection. The lowest concentration of SARS-CoV-2 viral copies this assay can detect is 250 copies / mL. A negative result does not preclude SARS-CoV-2 infection and should not be used as the sole basis for treatment or other patient management decisions.  A negative result may occur with improper specimen collection / handling, submission of specimen other than nasopharyngeal swab, presence of viral mutation(s) within the areas targeted by this assay, and inadequate number of viral copies (<250 copies / mL). A negative result must be combined with clinical observations, patient history, and epidemiological information.  Fact Sheet for Patients:   StrictlyIdeas.no  Fact Sheet for Healthcare Providers: BankingDealers.co.za  This test is not yet approved or  cleared by the [place] FDA and has been authorized for detection and/or diagnosis of SARS-CoV-2 by FDA under an Emergency Use Authorization (EUA).  This EUA will remain in effect (meaning this test can be used) for the  duration of the COVID-19 declaration under Section 564(b)(1) of the Act, 21 U.S.C. section 360bbb-3(b)(1), unless the authorization is terminated or revoked sooner.  Performed at [facility], [address] [place], [place] [address]   SARS Coronavirus 2 by RT PCR (hospital order, performed in [facility] hospital lab)  Nasopharyngeal Nasopharyngeal Swab     Status: None   Collection Time: 03/15/20  9:20 AM   Specimen: Nasopharyngeal Swab  Result Value Ref Range Status   SARS Coronavirus 2 NEGATIVE NEGATIVE Final    Comment: (NOTE) SARS-CoV-2 target nucleic acids are NOT DETECTED.  The SARS-CoV-2 RNA is generally detectable in upper and lower respiratory specimens during the acute phase of infection. The lowest concentration of SARS-CoV-2 viral copies this assay can detect is 250 copies / mL. A negative result does not preclude SARS-CoV-2 infection and should not be used as the sole basis for treatment or other patient management decisions.  A negative result may occur with improper specimen collection / handling, submission of specimen other than nasopharyngeal swab, presence of viral mutation(s) within the areas targeted by this assay, and inadequate number of viral copies (<250 copies / mL). A negative result must be combined with clinical observations, patient history, and epidemiological information.  Fact Sheet for Patients:   StrictlyIdeas.no  Fact Sheet for Healthcare Providers: [email or web address]  This test is not yet approved or  cleared by the [place] FDA and has been authorized for detection and/or diagnosis of SARS-CoV-2 by FDA under an Emergency Use Authorization (EUA).  This EUA will remain in effect (meaning this test can be used) for the duration of the COVID-19 declaration under Section 564(b)(1) of the Act, 21 U.S.C. section 360bbb-3(b)(1), unless the authorization is terminated or revoked sooner.  Performed at [facility], [address], [place], [place] [address]      Labs: Basic Metabolic Panel: Recent Labs  Lab 03/09/20 1353 03/10/20 0602  NA 135 134*  K 3.4* 4.5  CL 93* 97*  CO2 30 25  GLUCOSE 137* 124*  BUN 22 23  CREATININE 1.29* 1.24*  CALCIUM 9.0 9.0   CBC: Recent Labs  Lab 03/09/20 1353  03/09/20 1353 03/10/20 0602 03/11/20 0809 03/12/20 0616 03/13/20 0754 03/15/20 0518  WBC 6.0   < > 7.8 6.7 11.0* 9.1 7.0  NEUTROABS 4.9  --   --   --   --   --   --   HGB 10.7*   < > 9.5* 7.6* 8.0* 7.9* 7.8*  HCT 34.8*   < > 30.9* 25.4* 25.8* 25.9* 24.9*  MCV 100.6*   < > 100.7* 101.2* 101.2* 102.4* 98.8  PLT 200   < > 190 148* 170 200 212   < > = values in this interval not displayed.    BNP (last 3 results) Recent Labs    08/26/19 1135 12/13/19 0625 02/12/20 0752  BNP 483.0* 468.0* 313.0*    CBG: Recent Labs  Lab 03/11/20 0729  GLUCAP 113*   Signed:  Barton Dubois MD.  [facility] 03/15/2020, 2:31 PM

## 2020-03-16 ENCOUNTER — Telehealth (HOSPITAL_COMMUNITY): Payer: Medicare Other | Admitting: Hematology

## 2020-03-16 DIAGNOSIS — M80852A Other osteoporosis with current pathological fracture, left femur, initial encounter for fracture: Secondary | ICD-10-CM | POA: Diagnosis not present

## 2020-03-16 DIAGNOSIS — I480 Paroxysmal atrial fibrillation: Secondary | ICD-10-CM | POA: Diagnosis not present

## 2020-03-16 DIAGNOSIS — I10 Essential (primary) hypertension: Secondary | ICD-10-CM | POA: Diagnosis not present

## 2020-03-16 DIAGNOSIS — I5030 Unspecified diastolic (congestive) heart failure: Secondary | ICD-10-CM | POA: Diagnosis not present

## 2020-03-17 DIAGNOSIS — I130 Hypertensive heart and chronic kidney disease with heart failure and stage 1 through stage 4 chronic kidney disease, or unspecified chronic kidney disease: Secondary | ICD-10-CM | POA: Diagnosis not present

## 2020-03-18 ENCOUNTER — Telehealth: Payer: Self-pay | Admitting: Orthopedic Surgery

## 2020-03-18 NOTE — Telephone Encounter (Signed)
Call received from [facility] rehab, per Bubba Hales, to schedule hospital follow up/post op appointment for patient (date of surgery 03/10/20, left hip, gamma nail). Facility relays patient is due in 2 weeks; therefore, appointment has been scheduled accordingly.  Upon reviewing operative note, a 4 week follow up is to be scheduled. Please advise.

## 2020-03-21 NOTE — Telephone Encounter (Signed)
Spoke with facility representative [place] - appointment rescheduled accordingly; aware.

## 2020-03-21 NOTE — Telephone Encounter (Signed)
Go by op note please

## 2020-03-21 NOTE — Telephone Encounter (Signed)
Called back to [facility] rehab facility [phone]) to schedule patient as 4 week follow up/post op. Left voice message to return call.

## 2020-03-22 ENCOUNTER — Ambulatory Visit: Payer: Medicare Other | Admitting: *Deleted

## 2020-03-22 NOTE — Chronic Care Management (AMB) (Signed)
  Chronic Care Management   Outreach Note  03/22/2020 Name: Crystal Cooper MRN: [id number] DOB: 08/06/1926  Referred by: [name], MD Reason for referral : Chronic Care Management (RN follow up)   An unsuccessful telephone follow-up was attempted today. The patient was referred to the case management team for assistance with care management and care coordination. Crystal Cooper suffered a hip fracture on 03/09/20 and underwent surgery. She is currently in a SNF and I would like to discuss long-term plans with daughter [name].   Follow Up Plan: A HIPAA compliant phone message was left for the patient providing contact information and requesting a return call.  The care management team will reach out to the patient again over the next 30 days.   [name], BSN, RN-BC Embedded Chronic Care Manager [facility] / [place] Management Direct Dial: [phone]

## 2020-03-23 ENCOUNTER — Ambulatory Visit: Payer: Medicare Other | Admitting: *Deleted

## 2020-03-23 NOTE — Chronic Care Management (AMB) (Signed)
  Chronic Care Management   Note  03/23/2020 Name: Crystal Cooper MRN: [id number] DOB: 08/07/1926  Patient's daughter, [name], returned my telephone call from yesterday and left a voicemail message. I was unable to speak with her when I returned her call but did leave a HIPAA compliant voicemail advising that I will attempt to call again tomorrow and that she is welcome to return my call sooner if necessary. I would like to discuss her current SNF placement and future plans.   Follow up plan: The care management team will reach out to the patient again over the next 1 days.   [name], BSN, RN-BC Embedded Chronic Care Manager [facility] / [place] Management Direct Dial: [phone]

## 2020-03-23 NOTE — Patient Instructions (Signed)
RN will reach back out to patient tomorrow.   [name], BSN, RN-BC Embedded Chronic Care Manager [facility] / [place] Management Direct Dial: [phone]

## 2020-03-24 ENCOUNTER — Ambulatory Visit: Payer: Medicare Other | Admitting: *Deleted

## 2020-03-24 NOTE — Chronic Care Management (AMB) (Signed)
  Chronic Care Management   Outreach Note  03/24/2020 Name: Crystal Cooper MRN: [id number] DOB: 06-16-1927  Referred by: [name], MD Reason for referral : Chronic Care Management (RN follow up)   An unsuccessful telephone follow-up with daughter, [name], was attempted today. The patient was referred to the case management team for assistance with care management and care coordination. She has been placed in a SNF and I would like to discuss long-term plans with [name].   Follow Up Plan: A HIPAA compliant phone message was left for the patient providing contact information and requesting a return call.  The care management team will reach out to the patient again over the next 7 days.   [name], BSN, RN-BC Embedded Chronic Care Manager [facility] / [place] Management Direct Dial: [phone]

## 2020-03-29 ENCOUNTER — Telehealth: Payer: Medicare Other

## 2020-04-06 ENCOUNTER — Ambulatory Visit: Payer: Medicare Other | Admitting: Orthopedic Surgery

## 2020-04-07 ENCOUNTER — Ambulatory Visit (INDEPENDENT_AMBULATORY_CARE_PROVIDER_SITE_OTHER): Payer: Medicare Other | Admitting: *Deleted

## 2020-04-07 DIAGNOSIS — F0150 Vascular dementia without behavioral disturbance: Secondary | ICD-10-CM | POA: Diagnosis not present

## 2020-04-07 DIAGNOSIS — I10 Essential (primary) hypertension: Secondary | ICD-10-CM

## 2020-04-07 NOTE — Chronic Care Management (AMB) (Signed)
Chronic Care Management   Follow Up Note   04/07/2020 Name: Crystal Cooper MRN: [id number] DOB: 17-Aug-1926  Referred by: Crystal Fraise, MD Reason for referral : Chronic Care Management (RN follow up)   CHISA KUSHNER is a 86 y.o. year old female who is a primary care patient of Stacks, Crystal Gash, MD. The CCM team was consulted for assistance with chronic disease management and care coordination needs.    Review of patient status, including review of consultants reports, relevant laboratory and other test results, and collaboration with appropriate care team members and the patient's provider was performed as part of comprehensive patient evaluation and provision of chronic care management services.    SDOH (Social Determinants of Health) assessments performed: No See Care Plan activities for detailed interventions related to [facility])     Outpatient Encounter Medications as of 04/07/2020  Medication Sig Note  . albuterol (PROAIR HFA) 108 (90 Base) MCG/ACT inhaler Inhale 2 puffs into the lungs every 4 (four) hours as needed for wheezing or shortness of breath.   [name] aspirin EC 81 MG EC tablet Take 1 tablet (81 mg total) by mouth daily. Swallow whole.   . Biotin 10 MG CAPS Take 10 mg by mouth daily.    . Budeson-Glycopyrrol-Formoterol (BREZTRI AEROSPHERE) 160-9-4.8 MCG/ACT AERO Inhale 2 Inhalers into the lungs in the morning and at bedtime.   . Calcium Carb-Cholecalciferol (CALTRATE 600+D3) 600-800 MG-UNIT TABS Take 1 tablet by mouth daily.   [name] docusate sodium (COLACE) 100 MG capsule Take 1 capsule (100 mg total) by mouth 2 (two) times daily.   [name] 25 MCG tablet Take 1 tablet by mouth once daily   . famotidine (PEPCID) 20 MG tablet Take 1 tablet (20 mg total) by mouth at bedtime.   . feeding supplement, ENSURE ENLIVE, (ENSURE ENLIVE) LIQD Take 237 mLs by mouth daily.   . furosemide (LASIX) 20 MG tablet Take 4 tablets (80 mg total) by mouth daily.   . iron polysaccharides (NIFEREX) 150  MG capsule Take 1 capsule (150 mg total) by mouth 2 (two) times daily.   . memantine (NAMENDA) 10 MG tablet Take 1 tablet (10 mg total) by mouth 2 (two) times daily.   . metoprolol tartrate (LOPRESSOR) 25 MG tablet Take 1 tablet (25 mg total) by mouth 2 (two) times daily.   . pantoprazole (PROTONIX) 40 MG tablet Take 1 tablet (40 mg total) by mouth 2 (two) times daily.   . potassium chloride SA (KLOR-CON) 10 MEQ tablet Take 2 tablets (20 mEq total) by mouth 2 (two) times daily. (Patient taking differently: Take 10 mEq by mouth 2 (two) times daily. ) 03/11/2020: Prescribed 41meq BID, Pt's daughter says she gives it 109meq daily  . traMADol (ULTRAM) 50 MG tablet Take 1 tablet (50 mg total) by mouth every 8 (eight) hours as needed for severe pain.    No facility-administered encounter medications on file as of 04/07/2020.     Goals Addressed              This Visit's Progress     Patient Stated   .  Daughter: "We want mom to improve and come home from SNF" (pt-stated)        CARE PLAN ENTRY (see longitudinal plan of care for additional care plan information)  Current Barriers:  . Care Coordination needs related to mobility due to recent hip fracture and repair in a patient with osteoporosis, HTN, CKD, CHF, vascular dementia . Cognitive Deficits  Nurse Case  Manager Clinical Goal(s):  [name] Over the next 4 weeks, patient will continue to work with physical therapist at SNF to improve strength and mobility . Over the next 4 weeks, patient/family will talk with [place] about progress in the area of strength and mobility  Interventions:  . Inter-disciplinary care team collaboration (see longitudinal plan of care) . Chart reviewed including recent office notes and discharge notes . Talked with daughter, Crystal Cooper, regarding [name]'s current condition o Staying at [facility] for physical therapy and rehab o Still needs maximum assistance for any activities involving lower  extremity o Working with PT daily Mon-Fri o Using walker but can only walk about 15 feet o Uses wheelchair at other times . Discussed pain and daughter reports that [name] denies having any surgical pain . Discussed patient/family's desire for patient to improve to baseline and return home o They want her her to continue to receive inpatient therapy until she is back to or very close to baseline . Discussed family support o 2 daughters and they alternate days that they visit o Both have been encouraging patient to exercise upper and lower extremity and help her when they are there o Daughters are very involved and talk with nursing staff and PT staff routinely  . Previously provided [name] with RN Care Manager telephone number and encouraged to reach out as needed . RN plans to follow-up in 4 weeks but Crystal Cooper should call if she is discharged sooner than that or if there are any changes . Advised that TOC appt with [facility] will be needed and that they should receive a telephone call from [place] staff to schedule this after SNF discharge  Patient Self Care Activities:  . Unable to perform ADLs independently . Unable to perform IADLs independently . In SNF currently  Initial goal documentation         Plan:  Telephone follow up appointment with care management team member scheduled for: 05/13/20 with RN Care Manager   [name], BSN, RN-BC [place] / [place] Management Direct Dial: [phone]

## 2020-04-07 NOTE — Patient Instructions (Signed)
Visit Information  Goals Addressed              This Visit's Progress     Patient Stated   .  Daughter: "We want mom to improve and come home from SNF" (pt-stated)        CARE PLAN ENTRY (see longitudinal plan of care for additional care plan information)  Current Barriers:  . Care Coordination needs related to mobility due to recent hip fracture and repair in a patient with osteoporosis, HTN, CKD, CHF, vascular dementia . Cognitive Deficits  Nurse Case Manager Clinical Goal(s):  [name] Over the next 4 weeks, patient will continue to work with physical therapist at SNF to improve strength and mobility . Over the next 4 weeks, patient/family will talk with [place] about progress in the area of strength and mobility  Interventions:  . Inter-disciplinary care team collaboration (see longitudinal plan of care) . Chart reviewed including recent office notes and discharge notes . Talked with daughter, Crystal Cooper, regarding Crystal Cooper's current condition o Staying at [facility] for physical therapy and rehab o Still needs maximum assistance for any activities involving lower extremity o Working with PT daily Mon-Fri o Using walker but can only walk about 15 feet o Uses wheelchair at other times . Discussed pain and daughter reports that Crystal Leone denies having any surgical pain . Discussed patient/family's desire for patient to improve to baseline and return home o They want her her to continue to receive inpatient therapy until she is back to or very close to baseline . Discussed family support o 2 daughters and they alternate days that they visit o Both have been encouraging patient to exercise upper and lower extremity and help her when they are there o Daughters are very involved and talk with nursing staff and PT staff routinely  . Previously provided Crystal Cooper with RN Care Manager telephone number and encouraged to reach out as needed . RN plans to follow-up in 4 weeks  but Crystal Cooper should call if she is discharged sooner than that or if there are any changes . Advised that TOC appt with [facility] will be needed and that they should receive a telephone call from [facility] staff to schedule this after SNF discharge  Patient Self Care Activities:  . Unable to perform ADLs independently . Unable to perform IADLs independently . In SNF currently  Initial goal documentation        Patient verbalizes understanding of instructions provided today.   Follow-up Plan Telephone follow up appointment with care management team member scheduled for:05/13/2020 with RN Care Manager  [name], BSN, RN-BC [place] / [place] Management Direct Dial: [phone]

## 2020-04-12 ENCOUNTER — Telehealth: Payer: Self-pay | Admitting: Neurology

## 2020-04-12 NOTE — Telephone Encounter (Signed)
Pt's daughter Synetta Fail called stating that they are needing the RN to change a code for [employer] for date of service 11/11/19 in order for Medicare to cover it. Please advise.

## 2020-04-13 ENCOUNTER — Telehealth: Payer: Self-pay | Admitting: Emergency Medicine

## 2020-04-13 ENCOUNTER — Telehealth (HOSPITAL_COMMUNITY): Payer: Medicare Other | Admitting: Hematology

## 2020-04-13 ENCOUNTER — Other Ambulatory Visit: Payer: Self-pay | Admitting: *Deleted

## 2020-04-13 NOTE — Telephone Encounter (Signed)
LVM for daughter Quita Skye to call back.

## 2020-04-13 NOTE — Telephone Encounter (Signed)
I called [facility] billing at [phone] and spoke to [place]. She could not locate the patient by name, DOB or phone number. Therefore, she was unable to see any issues with billing for the date of service in question.  When the daughter calls back, [facility] will need the invoice off the bill she received in order to locate the claim. At that point, they can determine the problem. Most likely the lab work completed will just need additional ICD10 codes to justify the order.

## 2020-04-13 NOTE — Telephone Encounter (Signed)
Daughter called back, invoice #[id number], dated 11/11/2019, then  [employer] @ [phone] with information.  Spoke with Joselyn, updated with ICD codes pertinent to patient's diagnosis.  Informed has been refilled with patient's insurance company, will take 45 business days for response.  Quita Skye called back and updated to information.  Quita Skye expressed appreciation for help

## 2020-04-13 NOTE — Patient Outreach (Signed)
Member screened for potential [facility] Care Management needs as a benefit of [employer].  Per Patient Ping member resides in [facility].   Communication sent to [facility] SN[facility] SW to collaborate about anticipated dc plans and potential [facility] Care Management needs.  Will continue to follow while member resides in SNF.   Kamea Dacosta, MSN-Ed, RN,BSN [facility] Post Acute Care Coordinator [phone] ( Business Mobile) [phone]  (Toll free office)  

## 2020-04-18 ENCOUNTER — Other Ambulatory Visit: Payer: Self-pay | Admitting: *Deleted

## 2020-04-18 NOTE — Patient Outreach (Signed)
[place] Coordinator follow up. Member screened for potential [facility] Care Management needs as a benefit of [place] Medicare.  Per Patient Pearletha Forge member resides in [facility]. Update previously received from SNF SW indicating transition plan is to return home with caregiver assistance. Has supportive daughters.   Will continue to follow while member resides in SNF. Will send update to [place] at [place].    Marthenia Rolling, MSN-Ed, RN,BSN [place] Acute Care Coordinator [phone] [facility]) [phone]  (Toll free office)

## 2020-04-20 ENCOUNTER — Other Ambulatory Visit: Payer: Self-pay

## 2020-04-20 ENCOUNTER — Ambulatory Visit: Payer: Medicare Other

## 2020-04-20 ENCOUNTER — Ambulatory Visit (INDEPENDENT_AMBULATORY_CARE_PROVIDER_SITE_OTHER): Payer: Medicare Other | Admitting: Orthopedic Surgery

## 2020-04-20 VITALS — Ht 62.0 in

## 2020-04-20 DIAGNOSIS — S72002A Fracture of unspecified part of neck of left femur, initial encounter for closed fracture: Secondary | ICD-10-CM

## 2020-04-20 NOTE — Progress Notes (Signed)
POST OP VISIT   Chief Complaint  Patient presents with  . Follow-up    Recheck on left hip, DOS 03-10-20.    Encounter Diagnosis  Name Primary?  . Closed left hip fracture, initial encounter ([place]) Yes     Current Outpatient Medications:  .  albuterol (PROAIR HFA) 108 (90 Base) MCG/ACT inhaler, Inhale 2 puffs into the lungs every 4 (four) hours as needed for wheezing or shortness of breath., Disp: 18 g, Rfl: 11 .  Biotin 10 MG CAPS, Take 10 mg by mouth daily. , Disp: , Rfl:  .  Budeson-Glycopyrrol-Formoterol (BREZTRI AEROSPHERE) 160-9-4.8 MCG/ACT AERO, Inhale 2 Inhalers into the lungs in the morning and at bedtime., Disp: 5.9 g, Rfl: 1 .  Calcium Carb-Cholecalciferol (CALTRATE 600+D3) 600-800 MG-UNIT TABS, Take 1 tablet by mouth daily., Disp: , Rfl:  .  docusate sodium (COLACE) 100 MG capsule, Take 1 capsule (100 mg total) by mouth 2 (two) times daily., Disp: , Rfl:  .  EUTHYROX 25 MCG tablet, Take 1 tablet by mouth once daily, Disp: 30 tablet, Rfl: 7 .  famotidine (PEPCID) 20 MG tablet, Take 1 tablet (20 mg total) by mouth at bedtime., Disp: 90 tablet, Rfl: 1 .  feeding supplement, ENSURE ENLIVE, (ENSURE ENLIVE) LIQD, Take 237 mLs by mouth daily., Disp: , Rfl:  .  furosemide (LASIX) 20 MG tablet, Take 4 tablets (80 mg total) by mouth daily., Disp: 270 tablet, Rfl: 3 .  iron polysaccharides (NIFEREX) 150 MG capsule, Take 1 capsule (150 mg total) by mouth 2 (two) times daily., Disp: , Rfl:  .  memantine (NAMENDA) 10 MG tablet, Take 1 tablet (10 mg total) by mouth 2 (two) times daily., Disp: 180 tablet, Rfl: 1 .  metoprolol tartrate (LOPRESSOR) 25 MG tablet, Take 1 tablet (25 mg total) by mouth 2 (two) times daily., Disp: 180 tablet, Rfl: 3 .  pantoprazole (PROTONIX) 40 MG tablet, Take 1 tablet (40 mg total) by mouth 2 (two) times daily., Disp: 60 tablet, Rfl: 0 .  potassium chloride SA (KLOR-CON) 10 MEQ tablet, Take 2 tablets (20 mEq total) by mouth 2 (two) times daily. (Patient taking  differently: Take 10 mEq by mouth 2 (two) times daily. ), Disp: 60 tablet, Rfl: 2 .  traMADol (ULTRAM) 50 MG tablet, Take 1 tablet (50 mg total) by mouth every 8 (eight) hours as needed for severe pain., Disp: 20 tablet, Rfl: 0  THERE ARE NO COMPLAINTS   The fracture is healing well on x-ray of the limb alignment is good the patient is ambulating 3 times a day with standby assist patient will come back in about 6 weeks for repeat x-ray

## 2020-04-20 NOTE — Patient Instructions (Signed)
Continue therapy

## 2020-04-24 NOTE — Progress Notes (Deleted)
Cardiology Clinic Note   Patient Name: Crystal Cooper Date of Encounter: 04/24/2020  Primary Care Provider:  Neale Burly, MD Primary Cardiologist:  [name], MD  Patient Profile    Crystal Koone Priddyis a 87 y.o.femalewho presents for follow-up of her of paroxysmal atrial fibrillation, essential hypertension, and diastolic heart failure.  Past Medical History    Past Medical History:  Diagnosis Date  . AKI (acute kidney injury) ([place])   . Anemia    years ago  . Aortic stenosis   . Arthritis   . Asthma   . Atrial fibrillation ([place])   . CHF (congestive heart failure) ([place]) 11/2014  . CKD (chronic kidney disease)    had many uti this year   . Dementia ([place])   . Family history of adverse reaction to anesthesia    2 daughters would have N/V  . Gastric AVM   . GERD (gastroesophageal reflux disease)    was seen by Gi for bleeding ulcer which was cauterized   . GI bleed   . Glaucoma   . Hearing loss   . Heart murmur    Rheumatic fever whe she was young,  sees Dr. Stanford Breed in [place]  . HTN (hypertension)   . Hypokalemia   . Myocardial infarction ([place])    in her 55 when she had two heart attacks   . Pneumonia    stayed from friday to sunday in august  . Presence of permanent cardiac pacemaker    placed about 4 years ago  . Prolapsing mitral leaflet syndrome   . Shortness of breath dyspnea    with exertion  . Stroke ([place])   . SVT (supraventricular tachycardia) (H[facility])    S/P ablation of AVNRT in 2003   Past Surgical History:  Procedure Laterality Date  . APPENDECTOMY    . BIOPSY  04/07/2018   Procedure: BIOPSY;  Surgeon: Jerene Bears, MD;  Location: [facility] ENDOSCOPY;  Service: Gastroenterology;;  . BIOPSY  02/19/2020   Procedure: BIOPSY;  Surgeon: Harvel Quale, MD;  [facility] SU[facility];  Service: Gastroenterology;;  gastric   . BLADDER SURGERY    . CARDIAC CATHETERIZATION N/A 09/26/2015   Procedure: Right/Left Heart Cath and Coronary  Angiography;  Surgeon: Burnell Blanks, MD;  Location: [place] CV LAB;  Service: Cardiovascular;  Laterality: N/A;  . CARDIAC SURGERY    . CARDIOVERSION N/A 03/02/2016   Procedure: CARDIOVERSION;  Surgeon: Thayer Headings, MD;  Location[facility] ENDOSCOPY;  Service: Cardiovascular;  Laterality: N/A;  . ENTEROSCOPY  02/19/2020   Procedure: ENTEROSCOPY;  Surgeon: Harvel Quale, MD;  Location[facility] ENDO SUITE;  Service: Gastroenterology;;  . EP IMPLANTABLE DEVICE N/A 10/26/2015   Procedure: Pacemaker Implant;  Surgeon: Will Meredith Leeds, MD;  Location: [place] CV LAB;  Service: Cardiovascular;  Laterality: N/A;  . ESOPHAGOGASTRODUODENOSCOPY (EGD) WITH PROPOFOL N/A 04/07/2018   Procedure: ESOPHAGOGASTRODUODENOSCOPY (EGD) WITH PROPOFOL;  Surgeon: Jerene Bears, MD;  Location: [facility] ENDOSCOPY;  Service: Gastroenterology;  Laterality: N/A;  . ESOPHAGOGASTRODUODENOSCOPY (EGD) WITH PROPOFOL N/A 10/12/2019   Procedure: ESOPHAGOGASTRODUODENOSCOPY (EGD) WITH PROPOFOL;  Surgeon: Danie Binder, MD;  [facility];  Service: Endoscopy;  Laterality: N/A;  . EYE SURGERY Bilateral    cataract surgery  . FLEXIBLE SIGMOIDOSCOPY  02/19/2020   Procedure: FLEXIBLE SIGMOIDOSCOPY;  Surgeon: Montez Morita, Quillian Quince, MD;  [facility];  Service: Gastroenterology;;  . HOT HEMOSTASIS N/A 04/07/2018   Procedure: HOT HEMOSTASIS (ARGON PLASMA COAGULATION/BICAP);  Surgeon: Jerene Bears, MD;  Location: [facility] ENDOSCOPY;  Service: Gastroenterology;  Laterality: N/A;  . HOT HEMOSTASIS  10/12/2019   Procedure: HOT HEMOSTASIS (ARGON PLASMA COAGULATION/BICAP);  Surgeon: Danie Binder, MD;  [facility];  Service: Endoscopy;;  . INTRAMEDULLARY (IM) NAIL INTERTROCHANTERIC Left 03/10/2020   Procedure: OPEN TREATMENT INTERNAL FIXATION LEFT HIP (WITH GAMMA NAIL);  Surgeon: Carole Civil, MD;  Location: [facility] ORS;  Service: Orthopedics;  Laterality: Left;  . NASAL SINUS SURGERY    .  PACEMAKER INSERTION    . TEE WITHOUT CARDIOVERSION N/A 10/25/2015   Procedure: TRANSESOPHAGEAL ECHOCARDIOGRAM (TEE);  Surgeon: Burnell Blanks, MD;  Location: [place];  Service: Open Heart Surgery;  Laterality: N/A;  . TRANSCATHETER AORTIC VALVE REPLACEMENT, TRANSFEMORAL Right 10/25/2015   Procedure: TRANSCATHETER AORTIC VALVE REPLACEMENT, TRANSFEMORAL;  Surgeon: Burnell Blanks, MD;  Location: [place];  Service: Open Heart Surgery;  Laterality: Right;    Allergies  Allergies  Allergen Reactions  . Hctz [Hydrochlorothiazide] Other (See Comments)    Pt was ill and this affected her kidneys   . Aspirin Other (See Comments)    Cardiologist said the patient is to not take this  . Codeine Other (See Comments)    Made the patient feel ill, has not had any problems since 1977    History of Present Illness    Crystal Cooper has a PMH of paroxysmal atrial fibrillation, status post AVNRT in 2003, aortic valve stenosis, CAD.She also had TAVR on 10/2015, subsequent atrial flutter with cardioversion on 03/02/2016.  Prior history of GI bleed in October 2019 with a hemoglobin dropping to 5.4. She was treated for duodenitis and duodenal AVM. Eliquis was reinstated.It was noted that her hemoglobin was 8.9 with hematocrit of 28.5 while being evaluated in ED on 12/13/2019. This is prior to diuresis.She also has pacemaker, most recently checked remotely on 11/11/2019.   It showed that she was in A. fib battery and lead parameters were stable.  She was last seen by Jory Sims, DNP on 12/15/2019.She was continued on apixaban 2.5 mg twice daily but was also noted to have a hemoglobin 8.9 on review of labs. She was to continue on metoprolol 25 mg twice daily with good heart rate control. Follow-up CBC was recommended by PCP with consideration of stopping Eliquis should hemoglobin continue to decrease especially in light of prior GI bleed in 2019. She was to continue on iron replacement therapy  and iron infusions by Dr. Claudina Lick.  She was euvolemic at the time and was to continue daily weights and Lasix 60 mg daily. She was advised on letting us know if she gained 3 to 5 pounds in 2 to 3 days. Her daughter who was with her verbalized understanding and agreed to call us for directions concerning extra doses of the Lasix.  She presented to clinic 01/22/2020 for follow-up evaluation and stated she felt somewhat tired.  Her daughter indicated that neurology stated they wanted to keep her on her Eliquis medication.  However, she did not feel that they wanted to continue the medication.  They had not noticed any bleeding issues.  However,  her most recent blood work showed a hemoglobin of 8.0.  I will gave them a list of iron rich foods, repeated CBC, stopped her apixaban, and planned her follow-up for 3 months for further evaluation.  She underwent surgery for hip fracture 03/10/2020.  Repeat x-ray showed fracture was healing well with good alignment.  She had been progressing  her ambulation and was ambulating 3 times per day with standby assist.  She presents the clinic today for follow-up evaluation states***  Today she denies chest pain, increased shortness of breath, lower extremity edema, increased fatigue, palpitations, melena, hematuria, hemoptysis, diaphoresis, weakness, presyncope, syncope, orthopnea, and PND.   Home Medications    Prior to Admission medications   Medication Sig Start Date End Date Taking? Authorizing Provider  albuterol (PROAIR HFA) 108 (90 Base) MCG/ACT inhaler Inhale 2 puffs into the lungs every 4 (four) hours as needed for wheezing or shortness of breath. 08/05/19   Baruch Gouty, FNP  Biotin 10 MG CAPS Take 10 mg by mouth daily.     [provider]  Budeson-Glycopyrrol-Formoterol (BREZTRI AEROSPHERE) 160-9-4.8 MCG/ACT AERO Inhale 2 Inhalers into the lungs in the morning and at bedtime. 11/27/19   Ivy Lynn, NP  Calcium Carb-Cholecalciferol  (CALTRATE 600+D3) 600-800 MG-UNIT TABS Take 1 tablet by mouth daily.    [provider]  docusate sodium (COLACE) 100 MG capsule Take 1 capsule (100 mg total) by mouth 2 (two) times daily. 03/15/20   Barton Dubois, MD  EUTHYROX 25 MCG tablet Take 1 tablet by mouth once daily 03/07/20   [name], MD  famotidine (PEPCID) 20 MG tablet Take 1 tablet (20 mg total) by mouth at bedtime. 12/21/19 12/20/20  [name], MD  feeding supplement, ENSURE ENLIVE, (ENSURE ENLIVE) LIQD Take 237 mLs by mouth daily. 03/16/20   Barton Dubois, MD  furosemide (LASIX) 20 MG tablet Take 4 tablets (80 mg total) by mouth daily. 02/22/20   Lelon Perla, MD  iron polysaccharides (NIFEREX) 150 MG capsule Take 1 capsule (150 mg total) by mouth 2 (two) times daily. 03/15/20   Barton Dubois, MD  memantine (NAMENDA) 10 MG tablet Take 1 tablet (10 mg total) by mouth 2 (two) times daily. 01/04/20   Garvin Fila, MD  metoprolol tartrate (LOPRESSOR) 25 MG tablet Take 1 tablet (25 mg total) by mouth 2 (two) times daily. 05/20/19   Camnitz, Ocie Doyne, MD  pantoprazole (PROTONIX) 40 MG tablet Take 1 tablet (40 mg total) by mouth 2 (two) times daily. 02/21/20   Orson Eva, MD  potassium chloride SA (KLOR-CON) 10 MEQ tablet Take 2 tablets (20 mEq total) by mouth 2 (two) times daily. Patient taking differently: Take 10 mEq by mouth 2 (two) times daily.  02/09/20   [name], MD  traMADol (ULTRAM) 50 MG tablet Take 1 tablet (50 mg total) by mouth every 8 (eight) hours as needed for severe pain. 03/15/20   Barton Dubois, MD    Family History    Family History  Problem Relation Age of Onset  . Hypertension Mother   . Pneumonia Father   . Stomach cancer Brother   . Stroke Brother   . AAA (abdominal aortic aneurysm) Brother   . Cervical cancer Daughter   . Heart attack Neg Hx   . Colon cancer Neg Hx   . Esophageal cancer Neg Hx    She indicated that her mother is deceased. She indicated that her father is  deceased. She indicated that two of her four sisters are alive. She indicated that two of her seven brothers are alive. She indicated that her maternal grandmother is deceased. She indicated that her maternal grandfather is deceased. She indicated that her paternal grandmother is deceased. She indicated that her paternal grandfather is deceased. She indicated that two of her three daughters are alive. She indicated that the status  of her neg hx is unknown.  Social History    Social History   Socioeconomic History  . Marital status: Widowed    Spouse name: Not on file  . Number of children: 3  . Years of education: Not on file  . Highest education level: 12th grade  Occupational History  . Occupation: Retired  Tobacco Use  . Smoking status: Never Smoker  . Smokeless tobacco: Never Used  Vaping Use  . Vaping Use: Never used  Substance and Sexual Activity  . Alcohol use: No    Alcohol/week: 0.0 standard drinks  . Drug use: No  . Sexual activity: Not Currently  Other Topics Concern  . Not on file  Social History Narrative  . Not on file   Social Determinants of Health   Financial Resource Strain: Low Risk   . Difficulty of Paying Living Expenses: Not hard at all  Food Insecurity: No Food Insecurity  . Worried About Charity fundraiser in the Last Year: Never true  . Ran Out of Food in the Last Year: Never true  Transportation Needs:   . Lack of Transportation (Medical): Not on file  . Lack of Transportation (Non-Medical): Not on file  Physical Activity: Inactive  . Days of Exercise per Week: 0 days  . Minutes of Exercise per Session: 0 min  Stress: No Stress Concern Present  . Feeling of Stress : Not at all  Social Connections: Moderately Integrated  . Frequency of Communication with Friends and Family: More than three times a week  . Frequency of Social Gatherings with Friends and Family: More than three times a week  . Attends Religious Services: More than 4 times per  year  . Active Member of Clubs or Organizations: Yes  . Attends Archivist Meetings: More than 4 times per year  . Marital Status: Widowed  Intimate Partner Violence: Not At Risk  . Fear of Current or Ex-Partner: No  . Emotionally Abused: No  . Physically Abused: No  . Sexually Abused: No     Review of Systems    General:  No chills, fever, night sweats or weight changes.  Cardiovascular:  No chest pain, dyspnea on exertion, edema, orthopnea, palpitations, paroxysmal nocturnal dyspnea. Dermatological: No rash, lesions/masses Respiratory: No cough, dyspnea Urologic: No hematuria, dysuria Abdominal:   No nausea, vomiting, diarrhea, bright red blood per rectum, melena, or hematemesis Neurologic:  No visual changes, wkns, changes in mental status. All other systems reviewed and are otherwise negative except as noted above.  Physical Exam    VS:  There were no vitals taken for this visit. , BMI There is no height or weight on file to calculate BMI. GEN: Well nourished, well developed, in no acute distress. HEENT: normal. Neck: Supple, no JVD, carotid bruits, or masses. Cardiac: RRR, no murmurs, rubs, or gallops. No clubbing, cyanosis, edema.  Radials/DP/PT 2+ and equal bilaterally.  Respiratory:  Respirations regular and unlabored, clear to auscultation bilaterally. GI: Soft, nontender, nondistended, BS + x 4. MS: no deformity or atrophy. Skin: warm and dry, no rash. Neuro:  Strength and sensation are intact. Psych: Normal affect.  Accessory Clinical Findings    Recent Labs: 10/12/2019: Magnesium 2.0 12/31/2019: TSH 3.880 02/12/2020: B Natriuretic Peptide 313.0 02/20/2020: ALT 38 03/10/2020: BUN 23; Creatinine, Ser 1.24; Potassium 4.5; Sodium 134 03/15/2020: Hemoglobin 7.8; Platelets 212   Recent Lipid Panel    Component Value Date/Time   CHOL 129 09/29/2019 1021   TRIG 101 09/29/2019 1021  HDL 59 09/29/2019 1021   CHOLHDL 2.2 09/29/2019 1021   CHOLHDL 2.5  08/27/2019 0117   VLDL 12 08/27/2019 0117   LDLCALC 51 09/29/2019 1021    ECG personally reviewed by me today- *** - No acute changes  Echocardiogram 07/06/2019 1. Left ventricular ejection fraction, by visual estimation, is 60 to  65%. The left ventricle has normal function. There is mildly increased  left ventricular hypertrophy.  2. Abnormal septal motion consistent with RV pacemaker.  3. Elevated left atrial pressure.  4. Left ventricular diastolic parameters are consistent with Grade II  diastolic dysfunction (pseudonormalization).  5. The left ventricle has no regional wall motion abnormalities.  6. Global right ventricle has normal systolic function.The right  ventricular size is normal. No increase in right ventricular wall  thickness.  7. Left atrial size was mild-moderately dilated.  8. The mitral valve is degenerative. Mild mitral valve regurgitation.  9. The tricuspid valve is grossly normal.  10. 29 mm Edwards S3. Vmax 1.5 m/s, mean gradient 5.3 mmHG, EOA 1.63 cm2,  DI 0.52. No regurgitation or paravalvular leak.  11. Mildly elevated pulmonary artery systolic pressure.  12. The tricuspid regurgitant velocity is 3.01 m/s, and with an assumed  right atrial pressure of 3 mmHg, the estimated right ventricular systolic  pressure is mildly elevated at 39.3 mmHg.  13. The inferior vena cava is normal in size with greater than 50%  respiratory variability, suggesting right atrial pressure of 3 mmHg.  Assessment & Plan   1.  Atrial fibrillation-heart rate today 69*** BPM hemoglobin ***8.0 on 01/12/2020.  Shared decision making between patient her daughter and myself was used to decide to discontinue her apixaban.  CBC 04/18/2020 showed hemoglobin 7.0.*** Continue metoprolol Heart healthy low-sodium diet-salty 6 given Increase physical activity as tolerated Recommend following up with PCP for evaluation/treatment with another iron infusion.  Chronic diastolic heart  failure-euvolemic today.  Weight today 114.3*** pounds.  No increased work of breathing or activity intolerance.  Echocardiogram 1/21 showed an EF of 60-65% and G2 DD, normal functioning TAVR. Continue furosemide, metoprolol May take extra dose of Lasix for a weight increase of 3 pounds overnight or 5 pounds in 1 week Daily weights Elevate lower extremities when not active, lower extremity support stockings Heart healthy low-sodium diet-salty 6 given Increase physical activity as tolerated  Asthma- no recent breathing issues. Continue albuterol Followed by PCP  Anemia:Continues on iron replacement therapy. Continue to follow hemoglobin and hematocrit.  Followed by PCP  Disposition: Follow-up with Dr. Stanford Breed or me in 3 months.   Jossie Ng. Zyanya Glaza NP-C    04/24/2020, [facility] [place] [address] Office [phone] Fax [phone]  Notice: This dictation was prepared with Dragon dictation along with smaller phrase technology. Any transcriptional errors that result from this process are unintentional and may not be corrected upon review.

## 2020-04-25 ENCOUNTER — Ambulatory Visit: Payer: Medicare Other | Admitting: General Practice

## 2020-04-28 DIAGNOSIS — D649 Anemia, unspecified: Secondary | ICD-10-CM | POA: Diagnosis not present

## 2020-05-02 ENCOUNTER — Other Ambulatory Visit: Payer: Self-pay | Admitting: *Deleted

## 2020-05-02 NOTE — Patient Outreach (Signed)
[facility] Post- Acute Care Coordinator follow up. Member screened for potential [facility] Care Management needs as a benefit of [employer] Medicare.  Verified in Patient Ping that member resides in [facility].   Communication sent to SNF SW to inquire about transition plans and potential [facility] Care Management needs.   Will continue to follow while member resides in SNF.    Crystal Hughson, MSN-Ed, RN,BSN [facility] Post Acute Care Coordinator [phone] ( [employer]) 

## 2020-05-03 ENCOUNTER — Other Ambulatory Visit: Payer: Self-pay | Admitting: *Deleted

## 2020-05-03 NOTE — Patient Outreach (Signed)
[place] Coordinator follow up. Member screened for potential [facility] Care Management needs as a benefit of [place] Medicare.  Update received from SNF SW indicating member continues to need assist with ambulation. Daughters are very supportive. Transition plan is to return home with family support. No anticipated dc date yet.   Update sent to [place] at [place].   Will continue to follow while member resides in SNF.   Marthenia Rolling, MSN-Ed, RN,BSN [place] Acute Care Coordinator [phone] [facility]) [phone]  (Toll free office)

## 2020-05-04 DIAGNOSIS — I10 Essential (primary) hypertension: Secondary | ICD-10-CM | POA: Diagnosis not present

## 2020-05-04 DIAGNOSIS — M80852A Other osteoporosis with current pathological fracture, left femur, initial encounter for fracture: Secondary | ICD-10-CM | POA: Diagnosis not present

## 2020-05-04 DIAGNOSIS — I480 Paroxysmal atrial fibrillation: Secondary | ICD-10-CM | POA: Diagnosis not present

## 2020-05-04 DIAGNOSIS — J449 Chronic obstructive pulmonary disease, unspecified: Secondary | ICD-10-CM | POA: Diagnosis not present

## 2020-05-04 DIAGNOSIS — K219 Gastro-esophageal reflux disease without esophagitis: Secondary | ICD-10-CM | POA: Diagnosis not present

## 2020-05-05 ENCOUNTER — Telehealth: Payer: Medicare Other

## 2020-05-13 ENCOUNTER — Ambulatory Visit: Payer: Medicare Other | Admitting: *Deleted

## 2020-05-13 DIAGNOSIS — F0150 Vascular dementia without behavioral disturbance: Secondary | ICD-10-CM

## 2020-05-13 NOTE — Chronic Care Management (AMB) (Signed)
  Chronic Care Management   Note  05/13/2020 Name: Crystal Cooper MRN: [id number] DOB: [birth year]  [name] is still residing in SNF with no planned date for discharge. Her daughters are very involved in her care and the plan is to eventually bring her back home. She is being followed by Marthenia Rolling, RN, [place] Coordinator while at SNF. PCP has been changed to Dr Sherrie Sport for now and will switch back to Dr Livia Snellen once discharged.   Follow up plan: RNCM will follow-up with patient/family once discharged from SNF for care coordination and care management.   [name], BSN, RN-BC Embedded Chronic Care Manager [facility] [place] Management Direct Dial: [phone]

## 2020-05-18 IMAGING — DX DG CHEST 1V PORT
1 series · 1 of 1 positions shown · non-contrast
Comparison: Chest x-rays dated 10/05/2019 and 12/12/2014.

CLINICAL DATA: This of breath

EXAM:
PORTABLE CHEST 1 VIEW

[chest ap]
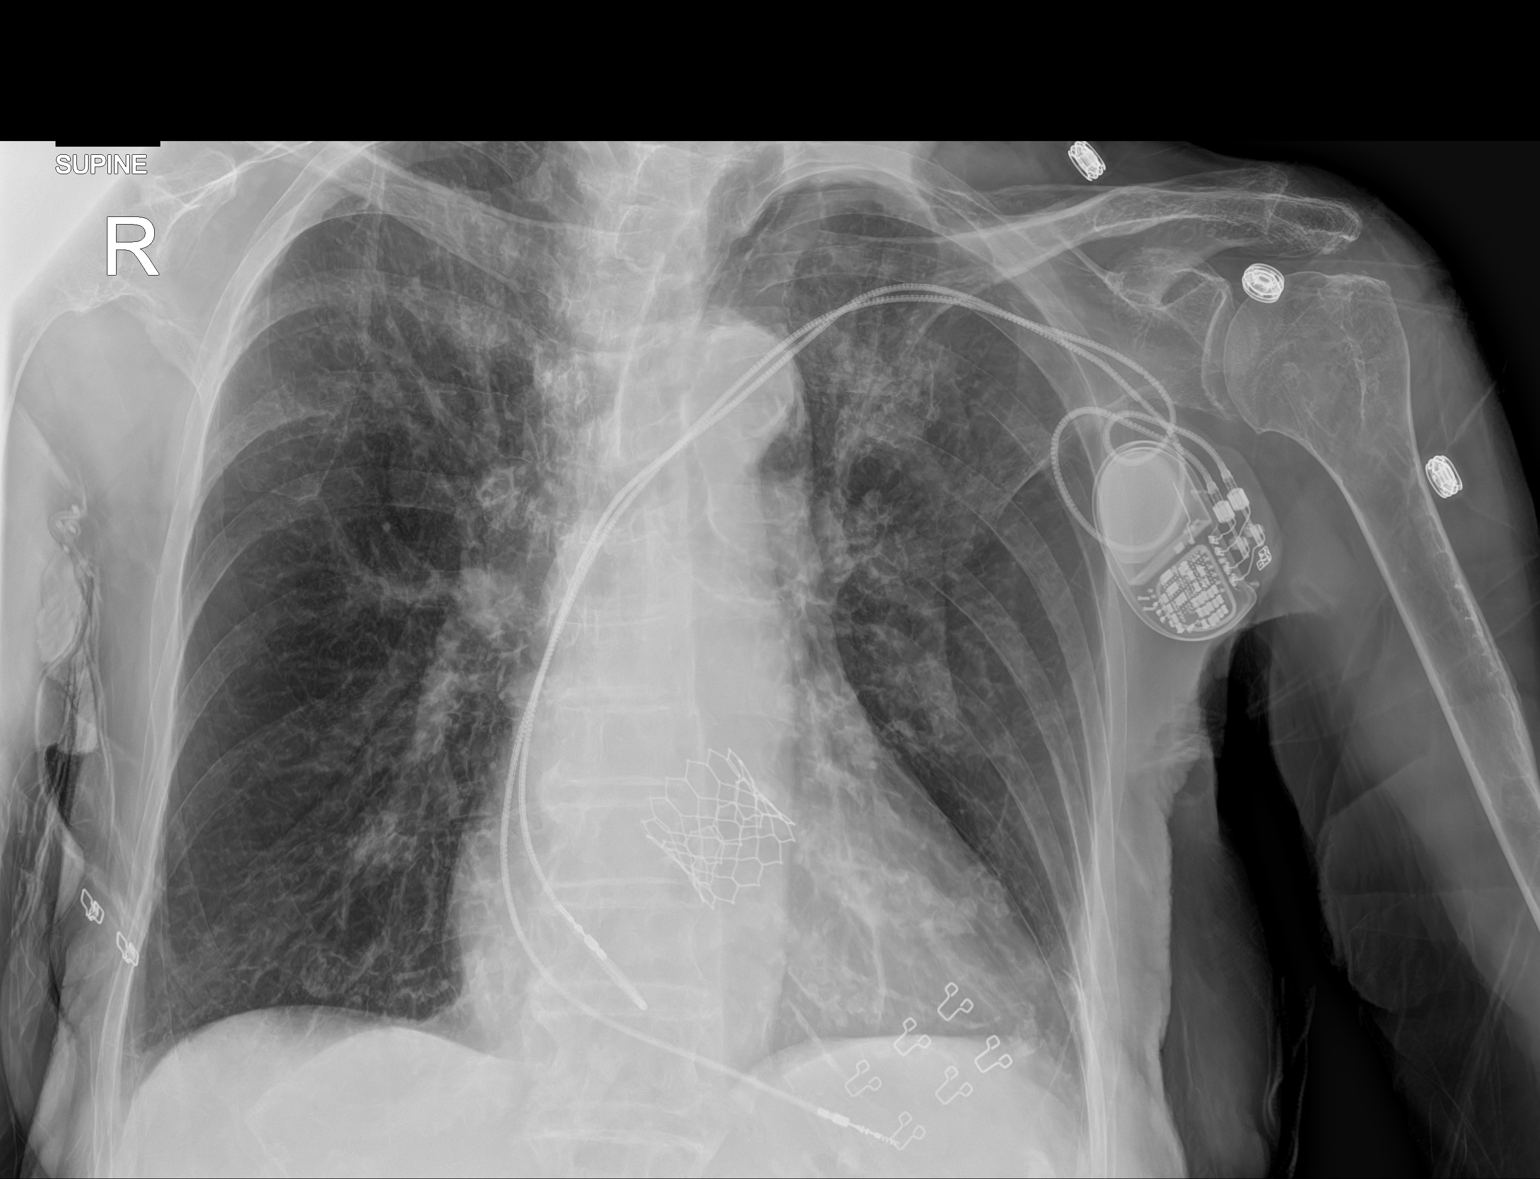

[1 of 1 positions shown; findings below may reference images not displayed]

FINDINGS: Stable cardiomegaly. Stable chronic scarring/fibrosis within the
upper lobes with associated hilar retractions. No new lung findings.
No pleural effusion or pneumothorax.

Valvular hardware is stable in position. LEFT chest wall
pacemaker/ICD hardware appears stable in position. No acute
appearing osseous abnormality. Aortic atherosclerosis.
IMPRESSION: 1. No active disease. No evidence of pneumonia or pulmonary edema.
2. Stable cardiomegaly.
3. Stable chronic appearing changes within the upper lobes,
corresponding to previous chest CT reports describing mucoid
impaction.
4. Aortic atherosclerosis.

## 2020-05-24 ENCOUNTER — Other Ambulatory Visit: Payer: Self-pay | Admitting: *Deleted

## 2020-05-24 NOTE — Patient Outreach (Signed)
[facility] Post- Acute Care Coordinator follow up. Member screened for potential [facility] Care Management needs as a benefit of [place] Medicare.  Update received from [place] indicating member will return home on Friday, Dec. 3rd with 24/7 care.  Will update [facility] ECM RN with [place].   Marthenia Rolling, MSN, RN,BSN [place] Acute Care Coordinator [phone] [facility]) [phone]  (Toll free office)

## 2020-05-26 ENCOUNTER — Telehealth: Payer: Self-pay

## 2020-05-26 NOTE — Telephone Encounter (Signed)
Appointment scheduled with Dr. Livia Snellen on 06/29/2019.

## 2020-05-27 ENCOUNTER — Ambulatory Visit (INDEPENDENT_AMBULATORY_CARE_PROVIDER_SITE_OTHER): Payer: Medicare Other

## 2020-05-27 DIAGNOSIS — I480 Paroxysmal atrial fibrillation: Secondary | ICD-10-CM | POA: Diagnosis not present

## 2020-05-30 LAB — CUP PACEART REMOTE DEVICE CHECK
Battery Remaining Longevity: 47 mo
Battery Voltage: 2.99 V
Brady Statistic RA Percent Paced: 0 %
Brady Statistic RV Percent Paced: 74.57 %
Date Time Interrogation Session: 20211204113837
Implantable Lead Implant Date: 20170503
Implantable Lead Implant Date: 20170503
Implantable Lead Location: 753859
Implantable Lead Location: 753860
Implantable Lead Model: 5076
Implantable Lead Model: 5076
Implantable Pulse Generator Implant Date: 20170503
Lead Channel Impedance Value: 285 Ohm
Lead Channel Impedance Value: 342 Ohm
Lead Channel Impedance Value: 399 Ohm
Lead Channel Impedance Value: 399 Ohm
Lead Channel Pacing Threshold Amplitude: 0.875 V
Lead Channel Pacing Threshold Amplitude: 1 V
Lead Channel Pacing Threshold Pulse Width: 0.4 ms
Lead Channel Pacing Threshold Pulse Width: 0.4 ms
Lead Channel Sensing Intrinsic Amplitude: 1.125 mV
Lead Channel Sensing Intrinsic Amplitude: 1.125 mV
Lead Channel Sensing Intrinsic Amplitude: 6.75 mV
Lead Channel Sensing Intrinsic Amplitude: 6.75 mV
Lead Channel Setting Pacing Amplitude: 2 V
Lead Channel Setting Pacing Amplitude: 2.5 V
Lead Channel Setting Pacing Pulse Width: 0.4 ms
Lead Channel Setting Sensing Sensitivity: 2.8 mV

## 2020-05-31 DIAGNOSIS — Z029 Encounter for administrative examinations, unspecified: Secondary | ICD-10-CM

## 2020-06-01 DIAGNOSIS — I059 Rheumatic mitral valve disease, unspecified: Secondary | ICD-10-CM | POA: Diagnosis not present

## 2020-06-01 DIAGNOSIS — K219 Gastro-esophageal reflux disease without esophagitis: Secondary | ICD-10-CM | POA: Diagnosis not present

## 2020-06-01 DIAGNOSIS — I130 Hypertensive heart and chronic kidney disease with heart failure and stage 1 through stage 4 chronic kidney disease, or unspecified chronic kidney disease: Secondary | ICD-10-CM | POA: Diagnosis not present

## 2020-06-01 DIAGNOSIS — I69351 Hemiplegia and hemiparesis following cerebral infarction affecting right dominant side: Secondary | ICD-10-CM | POA: Diagnosis not present

## 2020-06-01 DIAGNOSIS — M4802 Spinal stenosis, cervical region: Secondary | ICD-10-CM | POA: Diagnosis not present

## 2020-06-01 DIAGNOSIS — E039 Hypothyroidism, unspecified: Secondary | ICD-10-CM | POA: Diagnosis not present

## 2020-06-01 DIAGNOSIS — F0150 Vascular dementia without behavioral disturbance: Secondary | ICD-10-CM | POA: Diagnosis not present

## 2020-06-01 DIAGNOSIS — I480 Paroxysmal atrial fibrillation: Secondary | ICD-10-CM | POA: Diagnosis not present

## 2020-06-01 DIAGNOSIS — Q2733 Arteriovenous malformation of digestive system vessel: Secondary | ICD-10-CM | POA: Diagnosis not present

## 2020-06-01 DIAGNOSIS — J449 Chronic obstructive pulmonary disease, unspecified: Secondary | ICD-10-CM | POA: Diagnosis not present

## 2020-06-01 DIAGNOSIS — M791 Myalgia, unspecified site: Secondary | ICD-10-CM | POA: Diagnosis not present

## 2020-06-01 DIAGNOSIS — K259 Gastric ulcer, unspecified as acute or chronic, without hemorrhage or perforation: Secondary | ICD-10-CM | POA: Diagnosis not present

## 2020-06-01 DIAGNOSIS — G309 Alzheimer's disease, unspecified: Secondary | ICD-10-CM | POA: Diagnosis not present

## 2020-06-01 DIAGNOSIS — I10 Essential (primary) hypertension: Secondary | ICD-10-CM | POA: Diagnosis not present

## 2020-06-01 DIAGNOSIS — F0280 Dementia in other diseases classified elsewhere without behavioral disturbance: Secondary | ICD-10-CM | POA: Diagnosis not present

## 2020-06-01 DIAGNOSIS — I495 Sick sinus syndrome: Secondary | ICD-10-CM | POA: Diagnosis not present

## 2020-06-01 DIAGNOSIS — S72002D Fracture of unspecified part of neck of left femur, subsequent encounter for closed fracture with routine healing: Secondary | ICD-10-CM | POA: Diagnosis not present

## 2020-06-01 DIAGNOSIS — I0981 Rheumatic heart failure: Secondary | ICD-10-CM | POA: Diagnosis not present

## 2020-06-01 DIAGNOSIS — M170 Bilateral primary osteoarthritis of knee: Secondary | ICD-10-CM | POA: Diagnosis not present

## 2020-06-01 DIAGNOSIS — D631 Anemia in chronic kidney disease: Secondary | ICD-10-CM | POA: Diagnosis not present

## 2020-06-01 DIAGNOSIS — M47812 Spondylosis without myelopathy or radiculopathy, cervical region: Secondary | ICD-10-CM | POA: Diagnosis not present

## 2020-06-01 DIAGNOSIS — D62 Acute posthemorrhagic anemia: Secondary | ICD-10-CM | POA: Diagnosis not present

## 2020-06-01 DIAGNOSIS — N184 Chronic kidney disease, stage 4 (severe): Secondary | ICD-10-CM | POA: Diagnosis not present

## 2020-06-01 DIAGNOSIS — I471 Supraventricular tachycardia: Secondary | ICD-10-CM | POA: Diagnosis not present

## 2020-06-01 DIAGNOSIS — I5032 Chronic diastolic (congestive) heart failure: Secondary | ICD-10-CM | POA: Diagnosis not present

## 2020-06-01 DIAGNOSIS — H409 Unspecified glaucoma: Secondary | ICD-10-CM | POA: Diagnosis not present

## 2020-06-02 ENCOUNTER — Telehealth: Payer: Self-pay

## 2020-06-02 ENCOUNTER — Ambulatory Visit: Payer: Medicare Other | Admitting: Orthopedic Surgery

## 2020-06-02 ENCOUNTER — Telehealth: Payer: Medicare Other | Admitting: *Deleted

## 2020-06-02 DIAGNOSIS — J441 Chronic obstructive pulmonary disease with (acute) exacerbation: Secondary | ICD-10-CM | POA: Diagnosis not present

## 2020-06-02 DIAGNOSIS — J45901 Unspecified asthma with (acute) exacerbation: Secondary | ICD-10-CM | POA: Diagnosis not present

## 2020-06-02 DIAGNOSIS — R059 Cough, unspecified: Secondary | ICD-10-CM | POA: Diagnosis not present

## 2020-06-03 DIAGNOSIS — G309 Alzheimer's disease, unspecified: Secondary | ICD-10-CM | POA: Diagnosis not present

## 2020-06-03 DIAGNOSIS — F0150 Vascular dementia without behavioral disturbance: Secondary | ICD-10-CM | POA: Diagnosis not present

## 2020-06-03 DIAGNOSIS — J449 Chronic obstructive pulmonary disease, unspecified: Secondary | ICD-10-CM | POA: Diagnosis not present

## 2020-06-03 DIAGNOSIS — S72002D Fracture of unspecified part of neck of left femur, subsequent encounter for closed fracture with routine healing: Secondary | ICD-10-CM | POA: Diagnosis not present

## 2020-06-03 DIAGNOSIS — F0280 Dementia in other diseases classified elsewhere without behavioral disturbance: Secondary | ICD-10-CM | POA: Diagnosis not present

## 2020-06-03 DIAGNOSIS — I69351 Hemiplegia and hemiparesis following cerebral infarction affecting right dominant side: Secondary | ICD-10-CM | POA: Diagnosis not present

## 2020-06-03 NOTE — Telephone Encounter (Signed)
Patient states it has already been taking care went to Urgent care last night

## 2020-06-06 ENCOUNTER — Other Ambulatory Visit: Payer: Self-pay

## 2020-06-06 ENCOUNTER — Ambulatory Visit: Payer: Medicare Other

## 2020-06-06 ENCOUNTER — Encounter: Payer: Self-pay | Admitting: Orthopedic Surgery

## 2020-06-06 ENCOUNTER — Ambulatory Visit (INDEPENDENT_AMBULATORY_CARE_PROVIDER_SITE_OTHER): Payer: Medicare Other | Admitting: Orthopedic Surgery

## 2020-06-06 VITALS — BP 104/57 | HR 78 | Ht 61.0 in | Wt 106.0 lb

## 2020-06-06 DIAGNOSIS — S72002D Fracture of unspecified part of neck of left femur, subsequent encounter for closed fracture with routine healing: Secondary | ICD-10-CM

## 2020-06-06 NOTE — Progress Notes (Signed)
H

## 2020-06-06 NOTE — Progress Notes (Signed)
Chief Complaint  Patient presents with  . Hip Pain    L/ states it does not hurt     Encounter Diagnosis  Name Primary?  . Closed left hip fracture, with routine healing, subsequent encounter Yes    Date of surgery March 10, 2020 status post left gamma nail for left intertrochanteric fracture patient is doing well  No pain  Patient has femoral anteversion bilaterally equal with normal leg lengths x-rays show fracture healing patient is discharged follow-up as needed

## 2020-06-07 DIAGNOSIS — F0150 Vascular dementia without behavioral disturbance: Secondary | ICD-10-CM | POA: Diagnosis not present

## 2020-06-07 DIAGNOSIS — J449 Chronic obstructive pulmonary disease, unspecified: Secondary | ICD-10-CM | POA: Diagnosis not present

## 2020-06-07 DIAGNOSIS — G309 Alzheimer's disease, unspecified: Secondary | ICD-10-CM | POA: Diagnosis not present

## 2020-06-07 DIAGNOSIS — I69351 Hemiplegia and hemiparesis following cerebral infarction affecting right dominant side: Secondary | ICD-10-CM | POA: Diagnosis not present

## 2020-06-07 DIAGNOSIS — S72002D Fracture of unspecified part of neck of left femur, subsequent encounter for closed fracture with routine healing: Secondary | ICD-10-CM | POA: Diagnosis not present

## 2020-06-07 DIAGNOSIS — F0280 Dementia in other diseases classified elsewhere without behavioral disturbance: Secondary | ICD-10-CM | POA: Diagnosis not present

## 2020-06-08 DIAGNOSIS — J449 Chronic obstructive pulmonary disease, unspecified: Secondary | ICD-10-CM | POA: Diagnosis not present

## 2020-06-08 DIAGNOSIS — S72002D Fracture of unspecified part of neck of left femur, subsequent encounter for closed fracture with routine healing: Secondary | ICD-10-CM | POA: Diagnosis not present

## 2020-06-08 DIAGNOSIS — G309 Alzheimer's disease, unspecified: Secondary | ICD-10-CM | POA: Diagnosis not present

## 2020-06-08 DIAGNOSIS — I69351 Hemiplegia and hemiparesis following cerebral infarction affecting right dominant side: Secondary | ICD-10-CM | POA: Diagnosis not present

## 2020-06-08 DIAGNOSIS — F0150 Vascular dementia without behavioral disturbance: Secondary | ICD-10-CM | POA: Diagnosis not present

## 2020-06-08 DIAGNOSIS — F0280 Dementia in other diseases classified elsewhere without behavioral disturbance: Secondary | ICD-10-CM | POA: Diagnosis not present

## 2020-06-08 NOTE — Progress Notes (Signed)
Remote pacemaker transmission.   

## 2020-06-09 ENCOUNTER — Telehealth: Payer: Medicare Other

## 2020-06-09 DIAGNOSIS — I69351 Hemiplegia and hemiparesis following cerebral infarction affecting right dominant side: Secondary | ICD-10-CM | POA: Diagnosis not present

## 2020-06-09 DIAGNOSIS — J449 Chronic obstructive pulmonary disease, unspecified: Secondary | ICD-10-CM | POA: Diagnosis not present

## 2020-06-09 DIAGNOSIS — F0280 Dementia in other diseases classified elsewhere without behavioral disturbance: Secondary | ICD-10-CM | POA: Diagnosis not present

## 2020-06-09 DIAGNOSIS — G309 Alzheimer's disease, unspecified: Secondary | ICD-10-CM | POA: Diagnosis not present

## 2020-06-09 DIAGNOSIS — F0150 Vascular dementia without behavioral disturbance: Secondary | ICD-10-CM | POA: Diagnosis not present

## 2020-06-09 DIAGNOSIS — S72002D Fracture of unspecified part of neck of left femur, subsequent encounter for closed fracture with routine healing: Secondary | ICD-10-CM | POA: Diagnosis not present

## 2020-06-10 DIAGNOSIS — F0280 Dementia in other diseases classified elsewhere without behavioral disturbance: Secondary | ICD-10-CM | POA: Diagnosis not present

## 2020-06-10 DIAGNOSIS — I69351 Hemiplegia and hemiparesis following cerebral infarction affecting right dominant side: Secondary | ICD-10-CM | POA: Diagnosis not present

## 2020-06-10 DIAGNOSIS — S72002D Fracture of unspecified part of neck of left femur, subsequent encounter for closed fracture with routine healing: Secondary | ICD-10-CM | POA: Diagnosis not present

## 2020-06-10 DIAGNOSIS — J449 Chronic obstructive pulmonary disease, unspecified: Secondary | ICD-10-CM | POA: Diagnosis not present

## 2020-06-10 DIAGNOSIS — F0150 Vascular dementia without behavioral disturbance: Secondary | ICD-10-CM | POA: Diagnosis not present

## 2020-06-10 DIAGNOSIS — G309 Alzheimer's disease, unspecified: Secondary | ICD-10-CM | POA: Diagnosis not present

## 2020-06-13 DIAGNOSIS — F0280 Dementia in other diseases classified elsewhere without behavioral disturbance: Secondary | ICD-10-CM | POA: Diagnosis not present

## 2020-06-13 DIAGNOSIS — I69351 Hemiplegia and hemiparesis following cerebral infarction affecting right dominant side: Secondary | ICD-10-CM | POA: Diagnosis not present

## 2020-06-13 DIAGNOSIS — S72002D Fracture of unspecified part of neck of left femur, subsequent encounter for closed fracture with routine healing: Secondary | ICD-10-CM | POA: Diagnosis not present

## 2020-06-13 DIAGNOSIS — J449 Chronic obstructive pulmonary disease, unspecified: Secondary | ICD-10-CM | POA: Diagnosis not present

## 2020-06-13 DIAGNOSIS — G309 Alzheimer's disease, unspecified: Secondary | ICD-10-CM | POA: Diagnosis not present

## 2020-06-13 DIAGNOSIS — F0150 Vascular dementia without behavioral disturbance: Secondary | ICD-10-CM | POA: Diagnosis not present

## 2020-06-14 ENCOUNTER — Other Ambulatory Visit: Payer: Self-pay

## 2020-06-14 MED ORDER — PANTOPRAZOLE SODIUM 40 MG PO TBEC: 40.0000 mg | DELAYED_RELEASE_TABLET | Freq: Two times a day (BID) | ORAL | 0 refills | Status: DC

## 2020-06-14 NOTE — Telephone Encounter (Signed)
  Prescription Request  06/14/2020  What is the name of the medication or equipment? Pantoprazole-40mg   Have you contacted your pharmacy to request a refill? (if applicable) yes  Which pharmacy would you like this sent to? [facility]   Patient notified that their request is being sent to the clinical staff for review and that they should receive a response within 2 business days.

## 2020-06-15 DIAGNOSIS — F0150 Vascular dementia without behavioral disturbance: Secondary | ICD-10-CM | POA: Diagnosis not present

## 2020-06-15 DIAGNOSIS — F0280 Dementia in other diseases classified elsewhere without behavioral disturbance: Secondary | ICD-10-CM | POA: Diagnosis not present

## 2020-06-15 DIAGNOSIS — G309 Alzheimer's disease, unspecified: Secondary | ICD-10-CM | POA: Diagnosis not present

## 2020-06-15 DIAGNOSIS — I69351 Hemiplegia and hemiparesis following cerebral infarction affecting right dominant side: Secondary | ICD-10-CM | POA: Diagnosis not present

## 2020-06-15 DIAGNOSIS — J449 Chronic obstructive pulmonary disease, unspecified: Secondary | ICD-10-CM | POA: Diagnosis not present

## 2020-06-15 DIAGNOSIS — S72002D Fracture of unspecified part of neck of left femur, subsequent encounter for closed fracture with routine healing: Secondary | ICD-10-CM | POA: Diagnosis not present

## 2020-06-16 ENCOUNTER — Telehealth: Payer: Medicare Other | Admitting: *Deleted

## 2020-06-16 DIAGNOSIS — S72002D Fracture of unspecified part of neck of left femur, subsequent encounter for closed fracture with routine healing: Secondary | ICD-10-CM | POA: Diagnosis not present

## 2020-06-16 DIAGNOSIS — J449 Chronic obstructive pulmonary disease, unspecified: Secondary | ICD-10-CM | POA: Diagnosis not present

## 2020-06-16 DIAGNOSIS — F0150 Vascular dementia without behavioral disturbance: Secondary | ICD-10-CM | POA: Diagnosis not present

## 2020-06-16 DIAGNOSIS — I69351 Hemiplegia and hemiparesis following cerebral infarction affecting right dominant side: Secondary | ICD-10-CM | POA: Diagnosis not present

## 2020-06-16 DIAGNOSIS — F0280 Dementia in other diseases classified elsewhere without behavioral disturbance: Secondary | ICD-10-CM | POA: Diagnosis not present

## 2020-06-16 DIAGNOSIS — G309 Alzheimer's disease, unspecified: Secondary | ICD-10-CM | POA: Diagnosis not present

## 2020-06-20 ENCOUNTER — Other Ambulatory Visit: Payer: Self-pay

## 2020-06-20 ENCOUNTER — Ambulatory Visit: Payer: Medicare Other

## 2020-06-20 DIAGNOSIS — G309 Alzheimer's disease, unspecified: Secondary | ICD-10-CM | POA: Diagnosis not present

## 2020-06-20 DIAGNOSIS — J449 Chronic obstructive pulmonary disease, unspecified: Secondary | ICD-10-CM | POA: Diagnosis not present

## 2020-06-20 DIAGNOSIS — F0150 Vascular dementia without behavioral disturbance: Secondary | ICD-10-CM | POA: Diagnosis not present

## 2020-06-20 DIAGNOSIS — F0280 Dementia in other diseases classified elsewhere without behavioral disturbance: Secondary | ICD-10-CM | POA: Diagnosis not present

## 2020-06-20 DIAGNOSIS — S72002D Fracture of unspecified part of neck of left femur, subsequent encounter for closed fracture with routine healing: Secondary | ICD-10-CM | POA: Diagnosis not present

## 2020-06-20 DIAGNOSIS — I69351 Hemiplegia and hemiparesis following cerebral infarction affecting right dominant side: Secondary | ICD-10-CM | POA: Diagnosis not present

## 2020-06-21 ENCOUNTER — Ambulatory Visit (INDEPENDENT_AMBULATORY_CARE_PROVIDER_SITE_OTHER): Payer: Medicare Other | Admitting: Cardiology

## 2020-06-21 ENCOUNTER — Encounter: Payer: Self-pay | Admitting: Cardiology

## 2020-06-21 ENCOUNTER — Other Ambulatory Visit: Payer: Self-pay

## 2020-06-21 VITALS — BP 94/44 | HR 63 | Ht 60.0 in | Wt 116.5 lb

## 2020-06-21 DIAGNOSIS — I9589 Other hypotension: Secondary | ICD-10-CM

## 2020-06-21 DIAGNOSIS — Z9889 Other specified postprocedural states: Secondary | ICD-10-CM | POA: Diagnosis not present

## 2020-06-21 DIAGNOSIS — J439 Emphysema, unspecified: Secondary | ICD-10-CM

## 2020-06-21 DIAGNOSIS — Z950 Presence of cardiac pacemaker: Secondary | ICD-10-CM

## 2020-06-21 DIAGNOSIS — Q2730 Arteriovenous malformation, site unspecified: Secondary | ICD-10-CM | POA: Diagnosis not present

## 2020-06-21 DIAGNOSIS — D649 Anemia, unspecified: Secondary | ICD-10-CM | POA: Diagnosis not present

## 2020-06-21 DIAGNOSIS — I5189 Other ill-defined heart diseases: Secondary | ICD-10-CM

## 2020-06-21 DIAGNOSIS — S72002D Fracture of unspecified part of neck of left femur, subsequent encounter for closed fracture with routine healing: Secondary | ICD-10-CM | POA: Diagnosis not present

## 2020-06-21 DIAGNOSIS — I10 Essential (primary) hypertension: Secondary | ICD-10-CM

## 2020-06-21 DIAGNOSIS — F0150 Vascular dementia without behavioral disturbance: Secondary | ICD-10-CM | POA: Diagnosis not present

## 2020-06-21 DIAGNOSIS — J449 Chronic obstructive pulmonary disease, unspecified: Secondary | ICD-10-CM | POA: Diagnosis not present

## 2020-06-21 DIAGNOSIS — I480 Paroxysmal atrial fibrillation: Secondary | ICD-10-CM

## 2020-06-21 DIAGNOSIS — F0390 Unspecified dementia without behavioral disturbance: Secondary | ICD-10-CM

## 2020-06-21 DIAGNOSIS — G309 Alzheimer's disease, unspecified: Secondary | ICD-10-CM | POA: Diagnosis not present

## 2020-06-21 DIAGNOSIS — I63511 Cerebral infarction due to unspecified occlusion or stenosis of right middle cerebral artery: Secondary | ICD-10-CM

## 2020-06-21 DIAGNOSIS — N1832 Chronic kidney disease, stage 3b: Secondary | ICD-10-CM | POA: Diagnosis not present

## 2020-06-21 DIAGNOSIS — H9041 Sensorineural hearing loss, unilateral, right ear, with unrestricted hearing on the contralateral side: Secondary | ICD-10-CM | POA: Diagnosis not present

## 2020-06-21 DIAGNOSIS — F0280 Dementia in other diseases classified elsewhere without behavioral disturbance: Secondary | ICD-10-CM | POA: Diagnosis not present

## 2020-06-21 DIAGNOSIS — Z952 Presence of prosthetic heart valve: Secondary | ICD-10-CM

## 2020-06-21 DIAGNOSIS — I69351 Hemiplegia and hemiparesis following cerebral infarction affecting right dominant side: Secondary | ICD-10-CM | POA: Diagnosis not present

## 2020-06-21 LAB — BASIC METABOLIC PANEL
BUN/Creatinine Ratio: 20 (ref 12–28)
BUN: 25 mg/dL (ref 10–36)
CO2: 25 mmol/L (ref 20–29)
Calcium: 8.1 mg/dL — ABNORMAL LOW (ref 8.7–10.3)
Chloride: 98 mmol/L (ref 96–106)
Creatinine, Ser: 1.26 mg/dL — ABNORMAL HIGH (ref 0.57–1.00)
GFR calc Af Amer: 43 mL/min/{1.73_m2} — ABNORMAL LOW (ref >59)
GFR calc non Af Amer: 37 mL/min/{1.73_m2} — ABNORMAL LOW (ref >59)
Glucose: 120 mg/dL — ABNORMAL HIGH (ref 65–99)
Potassium: 3.9 mmol/L (ref 3.5–5.2)
Sodium: 136 mmol/L (ref 134–144)

## 2020-06-21 LAB — CBC
Hematocrit: 23.3 % — ABNORMAL LOW (ref 34.0–46.6)
Hemoglobin: 7 g/dL — CL (ref 11.1–15.9)
MCH: 25.7 pg — ABNORMAL LOW (ref 26.6–33.0)
MCHC: 30 g/dL — ABNORMAL LOW (ref 31.5–35.7)
MCV: 86 fL (ref 79–97)
Platelets: 247 10*3/uL (ref 150–450)
RBC: 2.72 x10E6/uL — CL (ref 3.77–5.28)
RDW: 15.9 % — ABNORMAL HIGH (ref 11.7–15.4)
WBC: 6.1 10*3/uL (ref 3.4–10.8)

## 2020-06-21 MED ORDER — FOLIC ACID 1 MG PO TABS: 1.0000 mg | ORAL_TABLET | Freq: Every day | ORAL | 6 refills | Status: DC

## 2020-06-21 NOTE — Progress Notes (Signed)
Cardiology Office Note:    Date:  06/21/2020   ID:  JAMA MCMILLER, DOB [birth year], MRN [id number]  PCP:  [name], MD  Cardiologist:  [name], MD  Electrophysiologist:  None   Referring MD: [name], MD   Chief Complaint  Patient presents with  . Follow-up    Follow-up     History of Present Illness:    Crystal Cooper is a 86 y.o.elderly frail female with a hx of severe aortic stenosis.s/p TAVR May 2017. She had a MDT pacemaker placed at that time as well for bradycardia. She also had PAF and underwent a DCCV in September 2017. She was placed on Eliquis. She presented to the emergency room 04/06/2018  profoundly anemic with a hemoglobin of 5.4. Her Eliquis was held and she was transfused. 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 There have been a couple of attempts at resuming her Eliquis.but she was evetually taken off anticoagulation permanently. In addition to the above she has dementia, is [facility], and has had a stoke June 2021.  Since then she has also had problems with URI and CAP as well as an admission for recurrent GI bleeding and in Sept 2021 she was admitted to [name] for fractured Lt hip.  Cardiology did not see her during that admission.  She was discharged to in patient rehab and returned home 05/27/2020.  Her daughter brought her into the office today for a check up.  The patient has an appointment with her PCP in Jan 2022.  Her last labs were in Oct.  The patient looks pale but in no distress. Her daughter notes increased SOB at home relieved with inhaler Rx.   Past Medical History:  Diagnosis Date  . AKI (acute kidney injury) ([place])   . Anemia    years ago  . Aortic stenosis   . Arthritis   . Asthma   . Atrial fibrillation ([place])   . CHF (congestive heart failure) ([place]) 11/2014  . CKD (chronic kidney disease)    had many uti this year   . Dementia ([place])   . Family history of adverse reaction to anesthesia    2 daughters would have N/V  .  Gastric AVM   . GERD (gastroesophageal reflux disease)    was seen by Gi for bleeding ulcer which was cauterized   . GI bleed   . Glaucoma   . Hearing loss   . Heart murmur    Rheumatic fever whe she was young,  sees Dr. Stanford Breed in [place]  . HTN (hypertension)   . Hypokalemia   . Myocardial infarction ([place])    in her 34 when she had two heart attacks   . Pneumonia    stayed from friday to sunday in august  . Presence of permanent cardiac pacemaker    placed about 4 years ago  . Prolapsing mitral leaflet syndrome   . Shortness of breath dyspnea    with exertion  . Stroke ([place])   . SVT (supraventricular tachycardia) (HCC)    S/P ablation of AVNRT in 2003    Past Surgical History:  Procedure Laterality Date  . APPENDECTOMY    . BIOPSY  04/07/2018   Procedure: BIOPSY;  Surgeon: Jerene Bears, MD;  Location: [facility] ENDOSCOPY;  Service: Gastroenterology;;  . BIOPSY  02/19/2020   Procedure: BIOPSY;  Surgeon: Harvel Quale, MD;  [facility] END[facility] SU[facility];  Service: Gastroenterology;;  gastric   . BLADDER SURGERY    . CARDIAC  CATHETERIZATION N/A 09/26/2015   Procedure: Right/Left Heart Cath and Coronary Angiography;  Surgeon: Burnell Blanks, MD;  Location: [place] CV LAB;  Service: Cardiovascular;  Laterality: N/A;  . CARDIAC SURGERY    . CARDIOVERSION N/A 03/02/2016   Procedure: CARDIOVERSION;  Surgeon: Thayer Headings, MD;  Location: [facility] ENDOSCOPY;  Service: Cardiovascular;  Laterality: N/A;  . ENTEROSCOPY  02/19/2020   Procedure: ENTEROSCOPY;  Surgeon: Harvel Quale, MD;  [facility] ENDO SU[facility];  Service: Gastroenterology;;  . EP IMPLANTABLE DEVICE N/A 10/26/2015   Procedure: Pacemaker Implant;  Surgeon: Will Meredith Leeds, MD;  Location: [place] CV LAB;  Service: Cardiovascular;  Laterality: N/A;  . ESOPHAGOGASTRODUODENOSCOPY (EGD) WITH PROPOFOL N/A 04/07/2018   Procedure: ESOPHAGOGASTRODUODENOSCOPY (EGD) WITH PROPOFOL;  Surgeon: Jerene Bears,  MD;  Location: [facility] ENDOSCOPY;  Service: Gastroenterology;  Laterality: N/A;  . ESOPHAGOGASTRODUODENOSCOPY (EGD) WITH PROPOFOL N/A 10/12/2019   Procedure: ESOPHAGOGASTRODUODENOSCOPY (EGD) WITH PROPOFOL;  Surgeon: Danie Binder, MD;  [facility];  Service: Endoscopy;  Laterality: N/A;  . EYE SURGERY Bilateral    cataract surgery  . FLEXIBLE SIGMOIDOSCOPY  02/19/2020   Procedure: FLEXIBLE SIGMOIDOSCOPY;  Surgeon: Montez Morita, Quillian Quince, MD;  [facility];  Service: Gastroenterology;;  . HOT HEMOSTASIS N/A 04/07/2018   Procedure: HOT HEMOSTASIS (ARGON PLASMA COAGULATION/BICAP);  Surgeon: Jerene Bears, MD;  Location: [facility] ENDOSCOPY;  Service: Gastroenterology;  Laterality: N/A;  . HOT HEMOSTASIS  10/12/2019   Procedure: HOT HEMOSTASIS (ARGON PLASMA COAGULATION/BICAP);  Surgeon: Danie Binder, MD;  [facility];  Service: Endoscopy;;  . INTRAMEDULLARY (IM) NAIL INTERTROCHANTERIC Left 03/10/2020   Procedure: OPEN TREATMENT INTERNAL FIXATION LEFT HIP (WITH GAMMA NAIL);  Surgeon: Carole Civil, MD;  Location: [facility] ORS;  Service: Orthopedics;  Laterality: Left;  . NASAL SINUS SURGERY    . PACEMAKER INSERTION    . TEE WITHOUT CARDIOVERSION N/A 10/25/2015   Procedure: TRANSESOPHAGEAL ECHOCARDIOGRAM (TEE);  Surgeon: Burnell Blanks, MD;  Location: [place];  Service: Open Heart Surgery;  Laterality: N/A;  . TRANSCATHETER AORTIC VALVE REPLACEMENT, TRANSFEMORAL Right 10/25/2015   Procedure: TRANSCATHETER AORTIC VALVE REPLACEMENT, TRANSFEMORAL;  Surgeon: Burnell Blanks, MD;  Location: [place];  Service: Open Heart Surgery;  Laterality: Right;    Current Medications: No outpatient medications have been marked as taking for the 06/21/20 encounter (Office Visit) with Erlene Quan, PA-C.     Allergies:   Hctz [hydrochlorothiazide], Aspirin, and Codeine   Social History   Socioeconomic History  . Marital status: Widowed    Spouse name: Not on file  . Number  of children: 3  . Years of education: Not on file  . Highest education level: 12th grade  Occupational History  . Occupation: Retired  Tobacco Use  . Smoking status: Never Smoker  . Smokeless tobacco: Never Used  Vaping Use  . Vaping Use: Never used  Substance and Sexual Activity  . Alcohol use: No    Alcohol/week: 0.0 standard drinks  . Drug use: No  . Sexual activity: Not Currently  Other Topics Concern  . Not on file  Social History Narrative  . Not on file   Social Determinants of Health   Financial Resource Strain: Low Risk   . Difficulty of Paying Living Expenses: Not hard at all  Food Insecurity: No Food Insecurity  . Worried About Charity fundraiser in the Last Year: Never true  . Ran Out of Food in the Last Year: Never true  Transportation Needs: Not on file  Physical Activity: Inactive  . Days of Exercise per Week: 0 days  . Minutes of Exercise per Session: 0 min  Stress: No Stress Concern Present  . Feeling of Stress : Not at all  Social Connections: Moderately Integrated  . Frequency of Communication with Friends and Family: More than three times a week  . Frequency of Social Gatherings with Friends and Family: More than three times a week  . Attends Religious Services: More than 4 times per year  . Active Member of Clubs or Organizations: Yes  . Attends Archivist Meetings: More than 4 times per year  . Marital Status: Widowed     Family History: The patient's family history includes AAA (abdominal aortic aneurysm) in her brother; Cervical cancer in her daughter; Hypertension in her mother; Pneumonia in her father; Stomach cancer in her brother; Stroke in her brother. There is no history of Heart attack, Colon cancer, or Esophageal cancer.  ROS:   Please see the history of present illness.     All other systems reviewed and are negative.  EKGs/Labs/Other Studies Reviewed:    The following studies were reviewed today: Echo  07/06/2019- IMPRESSIONS    1. Left ventricular ejection fraction, by visual estimation, is 60 to  65%. The left ventricle has normal function. There is mildly increased  left ventricular hypertrophy.  2. Abnormal septal motion consistent with RV pacemaker.  3. Elevated left atrial pressure.  4. Left ventricular diastolic parameters are consistent with Grade II  diastolic dysfunction (pseudonormalization).  5. The left ventricle has no regional wall motion abnormalities.  6. Global right ventricle has normal systolic function.The right  ventricular size is normal. No increase in right ventricular wall  thickness.  7. Left atrial size was mild-moderately dilated.  8. The mitral valve is degenerative. Mild mitral valve regurgitation.  9. The tricuspid valve is grossly normal.  10. 29 mm Edwards S3. Vmax 1.5 m/s, mean gradient 5.3 mmHG, EOA 1.63 cm2,  DI 0.52. No regurgitation or paravalvular leak.  11. Mildly elevated pulmonary artery systolic pressure.  12. The tricuspid regurgitant velocity is 3.01 m/s, and with an assumed  right atrial pressure of 3 mmHg, the estimated right ventricular systolic  pressure is mildly elevated at 39.3 mmHg.  13. The inferior vena cava is normal in size with greater than 50%  respiratory variability, suggesting right atrial pressure of 3 mmHg.   EKG:  EKG is not ordered today.  The ekg ordered 03/10/2020 demonstrates AF V-paced  Recent Labs: 10/12/2019: Magnesium 2.0 12/31/2019: TSH 3.880 02/12/2020: B Natriuretic Peptide 313.0 02/20/2020: ALT 38 03/10/2020: BUN 23; Creatinine, Ser 1.24; Potassium 4.5; Sodium 134 03/15/2020: Hemoglobin 7.8; Platelets 212  Recent Lipid Panel    Component Value Date/Time   CHOL 129 09/29/2019 1021   TRIG 101 09/29/2019 1021   HDL 59 09/29/2019 1021   CHOLHDL 2.2 09/29/2019 1021   CHOLHDL 2.5 08/27/2019 0117   VLDL 12 08/27/2019 0117   LDLCALC 51 09/29/2019 1021    Physical Exam:    VS:  BP (!) 94/44    Pulse 63   Ht 5' (1.524 m)   Wt 116 lb 8 oz (52.8 kg)   SpO2 98%   BMI 22.75 kg/m     Wt Readings from Last 3 Encounters:  06/21/20 116 lb 8 oz (52.8 kg)  06/06/20 106 lb (48.1 kg)  03/10/20 123 lb 14.4 oz (56.2 kg)     GEN: Elderly frail female  in wheelchair in no acute distress HEENT: Normal NECK: No JVD; No carotid bruits CARDIAC: RRR, no murmurs, rubs, gallops RESPIRATORY:  Scatter rhonchi ABDOMEN: Soft, non-tender, non-distended MUSCULOSKELETAL:  No edema; No deformity  SKIN: Warm and dry NEUROLOGIC:  Alert and oriented x 3 PSYCHIATRIC:  Normal affect   ASSESSMENT:    Anemia I suspect the patient may have significant anemia. Her last HGb was 7.0 (in [place]) Oct 25th.  Check STAT CBC and BMP.  I asked the pat's daughter if they had considered Hospice or Palliative Care and she said no.  I explained that if her mother was significantly anemic she may need to go to the hospital and she was agreeable to this.   PAF (paroxysmal atrial fibrillation) (HCC) V-paced LOV  Diastolic dysfunction Echo 2021- grade 2 DD, normal LVF  Essential hypertension B/P on the low side- check BMP as well as CBC  Pacemaker Placed May 2017 for post TAVR junctional bradycardia MDT duel chamber device. Dr Curt Bears follows.   S/P TAVR (transcatheter aortic valve replacement) TAVR May 2017-  CKD (chronic kidney disease), stage III GFR 38  PLAN:    STAT labs. If stable f/u dr Stanford Breed in 3 months- keep appointment with PCP in Jan.   Medication Adjustments/Labs and Tests Ordered: Current medicines are reviewed at length with the patient today.  Concerns regarding medicines are outlined above.  Orders Placed This Encounter  Procedures  . CBC  . Basic Metabolic Panel (BMET)   No orders of the defined types were placed in this encounter.   Patient Instructions  Medication Instructions:  Continue current medications  *If you need a refill on your cardiac medications  before your next appointment, please call your pharmacy*   Lab Work: CBC and BMP STAT  If you have labs (blood work) drawn today and your tests are completely normal, you will receive your results only by: [name] MyChart Message (if you have MyChart) OR . A paper copy in the mail If you have any lab test that is abnormal or we need to change your treatment, we will call you to review the results.   Testing/Procedures: None Ordered   Follow-Up: At [facility], you and your health needs are our priority.  As part of our continuing mission to provide you with exceptional heart care, we have created designated Provider Care Teams.  These Care Teams include your primary Cardiologist (physician) and Advanced Practice Providers (APPs -  Physician Assistants and Nurse Practitioners) who all work together to provide you with the care you need, when you need it.  We recommend signing up for the patient portal called "MyChart".  Sign up information is provided on this After Visit Summary.  MyChart is used to connect with patients for Virtual Visits (Telemedicine).  Patients are able to view lab/test results, encounter notes, upcoming appointments, etc.  Non-urgent messages can be sent to your provider as well.   To learn more about what you can do with MyChart, go to [email or web address].    Your next appointment:   3-4 month(s)  The format for your next appointment:   In Person  Provider:   You may see [name], MD or one of the following Advanced Practice Providers on your designated Care Team:    Kerin Ransom, PA-C  [place], [place]  Coletta Memos, FNP       Signed, Kerin Ransom, [place]  06/21/2020 3:14 PM    [place]

## 2020-06-21 NOTE — Patient Instructions (Signed)
Medication Instructions:  Continue current medications  *If you need a refill on your cardiac medications before your next appointment, please call your pharmacy*   Lab Work: CBC and BMP STAT  If you have labs (blood work) drawn today and your tests are completely normal, you will receive your results only by:  [place] (if you have MyChart) OR  A paper copy in the mail If you have any lab test that is abnormal or we need to change your treatment, we will call you to review the results.   Testing/Procedures: None Ordered   Follow-Up: At [facility], you and your health needs are our priority.  As part of our continuing mission to provide you with exceptional heart care, we have created designated Provider Care Teams.  These Care Teams include your primary Cardiologist (physician) and Advanced Practice Providers (APPs -  Physician Assistants and Nurse Practitioners) who all work together to provide you with the care you need, when you need it.  We recommend signing up for the patient portal called "MyChart".  Sign up information is provided on this After Visit Summary.  MyChart is used to connect with patients for Virtual Visits (Telemedicine).  Patients are able to view lab/test results, encounter notes, upcoming appointments, etc.  Non-urgent messages can be sent to your provider as well.   To learn more about what you can do with MyChart, go to [email or web address].    Your next appointment:   3-4 month(s)  The format for your next appointment:   In Person  Provider:   You may see [name], MD or one of the following Advanced Practice Providers on your designated Care Team:    Kerin Ransom, PA-C  [place], [place]  Coletta Memos, [place]

## 2020-06-22 ENCOUNTER — Telehealth: Payer: Self-pay | Admitting: Cardiology

## 2020-06-22 ENCOUNTER — Other Ambulatory Visit: Payer: Self-pay

## 2020-06-22 ENCOUNTER — Encounter (HOSPITAL_COMMUNITY): Payer: Self-pay | Admitting: *Deleted

## 2020-06-22 ENCOUNTER — Observation Stay (HOSPITAL_COMMUNITY)
Admission: EM | Admit: 2020-06-22 | Discharge: 2020-06-23 | Disposition: A | Discharge status: Home / Self Care | Payer: Medicare Other | Attending: Family Medicine | Admitting: Family Medicine

## 2020-06-22 ENCOUNTER — Other Ambulatory Visit: Payer: Self-pay | Admitting: Cardiology

## 2020-06-22 DIAGNOSIS — Z7901 Long term (current) use of anticoagulants: Secondary | ICD-10-CM | POA: Diagnosis not present

## 2020-06-22 DIAGNOSIS — F0390 Unspecified dementia without behavioral disturbance: Secondary | ICD-10-CM | POA: Insufficient documentation

## 2020-06-22 DIAGNOSIS — Z952 Presence of prosthetic heart valve: Secondary | ICD-10-CM

## 2020-06-22 DIAGNOSIS — I480 Paroxysmal atrial fibrillation: Secondary | ICD-10-CM | POA: Diagnosis present

## 2020-06-22 DIAGNOSIS — I130 Hypertensive heart and chronic kidney disease with heart failure and stage 1 through stage 4 chronic kidney disease, or unspecified chronic kidney disease: Secondary | ICD-10-CM | POA: Diagnosis not present

## 2020-06-22 DIAGNOSIS — N1832 Chronic kidney disease, stage 3b: Secondary | ICD-10-CM | POA: Diagnosis not present

## 2020-06-22 DIAGNOSIS — Z9861 Coronary angioplasty status: Secondary | ICD-10-CM | POA: Diagnosis not present

## 2020-06-22 DIAGNOSIS — D649 Anemia, unspecified: Secondary | ICD-10-CM

## 2020-06-22 DIAGNOSIS — K922 Gastrointestinal hemorrhage, unspecified: Secondary | ICD-10-CM | POA: Diagnosis not present

## 2020-06-22 DIAGNOSIS — Z950 Presence of cardiac pacemaker: Secondary | ICD-10-CM | POA: Diagnosis not present

## 2020-06-22 DIAGNOSIS — R531 Weakness: Secondary | ICD-10-CM | POA: Diagnosis present

## 2020-06-22 DIAGNOSIS — E039 Hypothyroidism, unspecified: Secondary | ICD-10-CM | POA: Diagnosis not present

## 2020-06-22 DIAGNOSIS — Z8673 Personal history of transient ischemic attack (TIA), and cerebral infarction without residual deficits: Secondary | ICD-10-CM | POA: Insufficient documentation

## 2020-06-22 DIAGNOSIS — Z20822 Contact with and (suspected) exposure to covid-19: Secondary | ICD-10-CM | POA: Diagnosis not present

## 2020-06-22 DIAGNOSIS — Q2730 Arteriovenous malformation, site unspecified: Secondary | ICD-10-CM

## 2020-06-22 DIAGNOSIS — J449 Chronic obstructive pulmonary disease, unspecified: Secondary | ICD-10-CM | POA: Diagnosis not present

## 2020-06-22 DIAGNOSIS — D500 Iron deficiency anemia secondary to blood loss (chronic): Principal | ICD-10-CM | POA: Insufficient documentation

## 2020-06-22 DIAGNOSIS — I4891 Unspecified atrial fibrillation: Secondary | ICD-10-CM | POA: Diagnosis not present

## 2020-06-22 DIAGNOSIS — J45909 Unspecified asthma, uncomplicated: Secondary | ICD-10-CM | POA: Insufficient documentation

## 2020-06-22 DIAGNOSIS — Z79899 Other long term (current) drug therapy: Secondary | ICD-10-CM | POA: Insufficient documentation

## 2020-06-22 DIAGNOSIS — N183 Chronic kidney disease, stage 3 unspecified: Secondary | ICD-10-CM | POA: Diagnosis present

## 2020-06-22 DIAGNOSIS — I5032 Chronic diastolic (congestive) heart failure: Secondary | ICD-10-CM | POA: Diagnosis not present

## 2020-06-22 HISTORY — DX: Gastrointestinal hemorrhage, unspecified: K92.2

## 2020-06-22 LAB — CBC WITH DIFFERENTIAL/PLATELET
Abs Immature Granulocytes: 0.03 10*3/uL (ref 0.00–0.07)
Basophils Absolute: 0.1 10*3/uL (ref 0.0–0.1)
Basophils Relative: 1 %
Eosinophils Absolute: 0.1 10*3/uL (ref 0.0–0.5)
Eosinophils Relative: 2 %
HCT: 24.5 % — ABNORMAL LOW (ref 36.0–46.0)
Hemoglobin: 7.4 g/dL — ABNORMAL LOW (ref 12.0–15.0)
Immature Granulocytes: 1 %
Lymphocytes Relative: 11 %
Lymphs Abs: 0.7 10*3/uL (ref 0.7–4.0)
MCH: 25.8 pg — ABNORMAL LOW (ref 26.0–34.0)
MCHC: 30.2 g/dL (ref 30.0–36.0)
MCV: 85.4 fL (ref 80.0–100.0)
Monocytes Absolute: 0.5 10*3/uL (ref 0.1–1.0)
Monocytes Relative: 9 %
Neutro Abs: 4.4 10*3/uL (ref 1.7–7.7)
Neutrophils Relative %: 76 %
Platelets: 274 10*3/uL (ref 150–400)
RBC: 2.87 MIL/uL — ABNORMAL LOW (ref 3.87–5.11)
RDW: 16.6 % — ABNORMAL HIGH (ref 11.5–15.5)
WBC: 5.7 10*3/uL (ref 4.0–10.5)
nRBC: 0 % (ref 0.0–0.2)

## 2020-06-22 LAB — BASIC METABOLIC PANEL
Anion gap: 9 (ref 5–15)
BUN: 25 mg/dL — ABNORMAL HIGH (ref 8–23)
CO2: 26 mmol/L (ref 22–32)
Calcium: 8.3 mg/dL — ABNORMAL LOW (ref 8.9–10.3)
Chloride: 99 mmol/L (ref 98–111)
Creatinine, Ser: 1.22 mg/dL — ABNORMAL HIGH (ref 0.44–1.00)
GFR, Estimated: 42 mL/min — ABNORMAL LOW (ref >60)
Glucose, Bld: 111 mg/dL — ABNORMAL HIGH (ref 70–99)
Potassium: 3.7 mmol/L (ref 3.5–5.1)
Sodium: 134 mmol/L — ABNORMAL LOW (ref 135–145)

## 2020-06-22 LAB — HEPATIC FUNCTION PANEL
ALT: 13 U/L (ref 0–44)
AST: 23 U/L (ref 15–41)
Albumin: 3 g/dL — ABNORMAL LOW (ref 3.5–5.0)
Alkaline Phosphatase: 122 U/L (ref 38–126)
Bilirubin, Direct: 0.1 mg/dL (ref 0.0–0.2)
Indirect Bilirubin: 0.4 mg/dL (ref 0.3–0.9)
Total Bilirubin: 0.5 mg/dL (ref 0.3–1.2)
Total Protein: 6.5 g/dL (ref 6.5–8.1)

## 2020-06-22 LAB — PREPARE RBC (CROSSMATCH)

## 2020-06-22 LAB — POC OCCULT BLOOD, ED: Fecal Occult Bld: POSITIVE — AB

## 2020-06-22 LAB — RESP PANEL BY RT-PCR (FLU A&B, COVID) ARPGX2
Influenza A by PCR: NEGATIVE
Influenza B by PCR: NEGATIVE
SARS Coronavirus 2 by RT PCR: NEGATIVE

## 2020-06-22 MED ORDER — SODIUM CHLORIDE 0.9% FLUSH
3.0000 mL | Freq: Two times a day (BID) | INTRAVENOUS | Status: DC
  Administered 2020-06-22 – 2020-06-23 (×2): 3 mL via INTRAVENOUS

## 2020-06-22 MED ORDER — TRAZODONE HCL 50 MG PO TABS: 50.0000 mg | ORAL_TABLET | Freq: Every evening | ORAL | Status: DC | PRN

## 2020-06-22 MED ORDER — FAMOTIDINE 20 MG PO TABS
20.0000 mg | ORAL_TABLET | Freq: Every day | ORAL | Status: DC
  Administered 2020-06-22: 19:00:00 20 mg via ORAL
  Filled 2020-06-22: qty 1

## 2020-06-22 MED ORDER — LABETALOL HCL 5 MG/ML IV SOLN: 10.0000 mg | INTRAVENOUS | Status: DC | PRN

## 2020-06-22 MED ORDER — ACETAMINOPHEN 325 MG PO TABS: 650.0000 mg | ORAL_TABLET | Freq: Four times a day (QID) | ORAL | Status: DC | PRN

## 2020-06-22 MED ORDER — POLYETHYLENE GLYCOL 3350 17 G PO PACK: 17.0000 g | PACK | Freq: Every day | ORAL | Status: DC | PRN

## 2020-06-22 MED ORDER — SODIUM CHLORIDE 0.9% FLUSH: 3.0000 mL | INTRAVENOUS | Status: DC | PRN

## 2020-06-22 MED ORDER — FUROSEMIDE 10 MG/ML IJ SOLN
40.0000 mg | Freq: Two times a day (BID) | INTRAMUSCULAR | Status: DC
  Administered 2020-06-23: 09:00:00 40 mg via INTRAVENOUS
  Filled 2020-06-22: qty 4

## 2020-06-22 MED ORDER — SODIUM CHLORIDE 0.9 % IV SOLN: 250.0000 mL | INTRAVENOUS | Status: DC | PRN

## 2020-06-22 MED ORDER — SODIUM CHLORIDE 0.9 % IV SOLN
10.0000 mL/h | Freq: Once | INTRAVENOUS | Status: AC
  Administered 2020-06-22: 16:00:00 10 mL/h via INTRAVENOUS

## 2020-06-22 MED ORDER — POTASSIUM CHLORIDE 20 MEQ PO PACK: 20.0000 meq | PACK | Freq: Every day | ORAL | Status: DC

## 2020-06-22 MED ORDER — FOLIC ACID 1 MG PO TABS
1.0000 mg | ORAL_TABLET | Freq: Every day | ORAL | Status: DC
Start: 2020-06-22 — End: 2020-06-23
  Administered 2020-06-23: 09:00:00 1 mg via ORAL
  Filled 2020-06-22: qty 1

## 2020-06-22 MED ORDER — ALBUTEROL SULFATE HFA 108 (90 BASE) MCG/ACT IN AERS: 2.0000 | INHALATION_SPRAY | RESPIRATORY_TRACT | Status: DC | PRN

## 2020-06-22 MED ORDER — PANTOPRAZOLE SODIUM 40 MG IV SOLR
40.0000 mg | Freq: Two times a day (BID) | INTRAVENOUS | Status: DC
  Administered 2020-06-23: 09:00:00 40 mg via INTRAVENOUS
  Filled 2020-06-22: qty 40

## 2020-06-22 MED ORDER — BUDESON-GLYCOPYRROL-FORMOTEROL 160-9-4.8 MCG/ACT IN AERO: 2.0000 | INHALATION_SPRAY | Freq: Every day | RESPIRATORY_TRACT | Status: DC

## 2020-06-22 MED ORDER — FUROSEMIDE 10 MG/ML IJ SOLN
INTRAMUSCULAR | Status: AC
  Administered 2020-06-22: 18:00:00 40 mg via INTRAVENOUS
  Filled 2020-06-22: qty 4

## 2020-06-22 MED ORDER — LEVOTHYROXINE SODIUM 25 MCG PO TABS
25.0000 ug | ORAL_TABLET | Freq: Every day | ORAL | Status: DC
  Administered 2020-06-23: 05:00:00 25 ug via ORAL
  Filled 2020-06-22: qty 1

## 2020-06-22 MED ORDER — METOPROLOL TARTRATE 25 MG PO TABS
25.0000 mg | ORAL_TABLET | Freq: Two times a day (BID) | ORAL | Status: DC
  Administered 2020-06-22 – 2020-06-23 (×2): 25 mg via ORAL
  Filled 2020-06-22 (×2): qty 1

## 2020-06-22 MED ORDER — ONDANSETRON HCL 4 MG/2ML IJ SOLN: 4.0000 mg | Freq: Four times a day (QID) | INTRAMUSCULAR | Status: DC | PRN

## 2020-06-22 MED ORDER — ENSURE ENLIVE PO LIQD: 237.0000 mL | ORAL | Status: DC

## 2020-06-22 MED ORDER — LEVOTHYROXINE SODIUM 50 MCG PO TABS: 25.0000 ug | ORAL_TABLET | Freq: Every day | ORAL | Status: DC

## 2020-06-22 MED ORDER — POLYSACCHARIDE IRON COMPLEX 150 MG PO CAPS: 150.0000 mg | ORAL_CAPSULE | Freq: Every day | ORAL | 3 refills | Status: DC

## 2020-06-22 MED ORDER — ONDANSETRON HCL 4 MG PO TABS: 4.0000 mg | ORAL_TABLET | Freq: Four times a day (QID) | ORAL | Status: DC | PRN

## 2020-06-22 MED ORDER — MEMANTINE HCL 10 MG PO TABS
10.0000 mg | ORAL_TABLET | Freq: Two times a day (BID) | ORAL | Status: DC
  Administered 2020-06-22 – 2020-06-23 (×2): 10 mg via ORAL
  Filled 2020-06-22 (×2): qty 1

## 2020-06-22 MED ORDER — BISACODYL 10 MG RE SUPP: 10.0000 mg | Freq: Every day | RECTAL | Status: DC | PRN

## 2020-06-22 MED ORDER — ACETAMINOPHEN 650 MG RE SUPP: 650.0000 mg | Freq: Four times a day (QID) | RECTAL | Status: DC | PRN

## 2020-06-22 MED ORDER — MOMETASONE FURO-FORMOTEROL FUM 200-5 MCG/ACT IN AERO
2.0000 | INHALATION_SPRAY | Freq: Two times a day (BID) | RESPIRATORY_TRACT | Status: DC
  Administered 2020-06-22: 19:00:00 2 via RESPIRATORY_TRACT
  Filled 2020-06-22: qty 8.8

## 2020-06-22 MED ORDER — TRAMADOL HCL 50 MG PO TABS: 50.0000 mg | ORAL_TABLET | Freq: Three times a day (TID) | ORAL | Status: DC | PRN

## 2020-06-22 MED ORDER — SENNOSIDES-DOCUSATE SODIUM 8.6-50 MG PO TABS
2.0000 | ORAL_TABLET | Freq: Every day | ORAL | Status: DC
  Administered 2020-06-22: 19:00:00 2 via ORAL
  Filled 2020-06-22: qty 2

## 2020-06-22 MED ORDER — UMECLIDINIUM BROMIDE 62.5 MCG/INH IN AEPB
1.0000 | INHALATION_SPRAY | Freq: Every day | RESPIRATORY_TRACT | Status: DC
  Filled 2020-06-22: qty 7

## 2020-06-22 NOTE — ED Triage Notes (Signed)
Per pt's daughter pt sent here due to her HBG is low at 7, drawn yesterday at [facility] in [place].

## 2020-06-22 NOTE — Telephone Encounter (Signed)
I called and spoke with pt's daughter Quita Skye who had accompanied her to the office yesterday.  I explained her mother's Hgb was very low and I suggested she go to the ED at [facility] to have it re checked.  I suspect she may need admission for transfusion.  Her mother is stable this am, her daughter will bring her later today.  Kerin Ransom PA-C 06/22/2020 8:35 AM

## 2020-06-22 NOTE — ED Provider Notes (Signed)
[facility] EMERGENCY DEPARTMENT Provider Note   CSN: [id number] Arrival date & time: 06/22/20  1053     History No chief complaint on file.   Crystal Cooper is a 86 y.o. female.  HPI      Crystal Cooper is a 86 y.o. female with atrial fibrillation, CKD, CHF, dementia, and GI bleed who presents to the Emergency Department accompanied by her daughter.  Daughter is caregiver and provides history information.  She states her mother had a routine cardiology appointment yesterday and labs were drawn.  She was contacted this morning stating that her mother's hemoglobin was 7 and advised to come to the emergency department for further evaluation and possible transfusion.  Patient's daughter states that her mother has had increasing generalized weakness, fatigue and episodic shortness of breath mostly at night.  She also notes that her mother had a bloody stool this morning.  Daughter states that she was admitted in September for a GI bleed.  Patient has history of dementia and unable to provide any history.  She is not currently anticoagulated due to her previous GI bleed.    Past Medical History:  Diagnosis Date  . AKI (acute kidney injury) ([place])   . Anemia    years ago  . Aortic stenosis   . Arthritis   . Asthma   . Atrial fibrillation ([place])   . CHF (congestive heart failure) ([place]) 11/2014  . CKD (chronic kidney disease)    had many uti this year   . Dementia ([place])   . Family history of adverse reaction to anesthesia    2 daughters would have N/V  . Gastric AVM   . GERD (gastroesophageal reflux disease)    was seen by Gi for bleeding ulcer which was cauterized   . GI bleed   . Glaucoma   . Hearing loss   . Heart murmur    Rheumatic fever whe she was young,  sees Dr. Stanford Breed in [place]  . HTN (hypertension)   . Hypokalemia   . Myocardial infarction ([place])    in her 67 when she had two heart attacks   . Pneumonia    stayed from friday to sunday in august  . Presence  of permanent cardiac pacemaker    placed about 4 years ago  . Prolapsing mitral leaflet syndrome   . Shortness of breath dyspnea    with exertion  . Stroke ([place])   . SVT (supraventricular tachycardia) (HCC)    S/P ablation of AVNRT in 2003    Patient Active Problem List   Diagnosis Date Noted  . Status post hip surgery   . Closed left hip fracture, initial encounter ([place]) 03/09/2020  . Upper GI bleed 02/19/2020  . Acute blood loss anemia 02/19/2020  . CKD (chronic kidney disease) stage 3, GFR 30-59 ml/min (HCC) 02/19/2020  . Hematochezia 02/19/2020  . Iron deficiency anemia due to chronic blood loss 02/18/2020  . AVM (arteriovenous malformation) 02/18/2020  . COPD (chronic obstructive pulmonary disease) ([place]) 02/18/2020  . Dementia without behavioral disturbance ([place]) 02/18/2020  . SOB (shortness of breath)   . COPD with acute exacerbation ([place])   . CAP (community acquired pneumonia) 02/12/2020  . Bilateral impacted cerumen 11/27/2019  . Long term (current) use of anticoagulants   . Acute renal failure superimposed on stage 3b chronic kidney disease ([place])   . Wheezing 09/23/2019  . Abnormal breath sounds 09/09/2019  . Heme positive stool   . Acute CVA (cerebrovascular accident) ([place])  08/27/2019  . PNA (pneumonia) 08/25/2019  . Hypokalemia 08/23/2019  . Angio-edema, initial encounter 08/23/2019  . Reactive airway disease 03/24/2019  . Gastroesophageal reflux disease 03/24/2019  . CKD (chronic kidney disease), stage III ([place]) 11/12/2018  . GI bleed 05/12/2018  . 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 Diastolic dysfunction 50/35/4656  . Pacemaker 05/12/2018  . Iron deficiency anemia   . Melena   . Duodenal arteriovenous malformation   . Gastritis and gastroduodenitis   . Normocytic anemia 04/06/2018  . Osteoporosis 08/27/2016  . ASCVD (arteriosclerotic cardiovascular disease) 01/11/2016  . Asthma 01/11/2016  . Glaucoma 01/11/2016  . Hearing  loss 01/11/2016  . Other nonrheumatic mitral valve disorders 01/11/2016  . Junctional bradycardia   . PAF (paroxysmal atrial fibrillation) ([place]) 09/06/2015  . Hyponatremia 12/17/2014  . Congestive heart failure ([place]) 12/12/2014  . Murmur 10/19/2011  . Chest pain 10/19/2011  . Essential hypertension 10/19/2011  . History of cardiac radiofrequency ablation (RFA) 10/19/2011    Past Surgical History:  Procedure Laterality Date  . APPENDECTOMY    . BIOPSY  04/07/2018   Procedure: BIOPSY;  Surgeon: Jerene Bears, MD;  Location: [facility] ENDOSCOPY;  Service: Gastroenterology;;  . BIOPSY  02/19/2020   Procedure: BIOPSY;  Surgeon: Harvel Quale, MD;  [facility];  Service: Gastroenterology;;  gastric   . BLADDER SURGERY    . CARDIAC CATHETERIZATION N/A 09/26/2015   Procedure: Right/Left Heart Cath and Coronary Angiography;  Surgeon: Burnell Blanks, MD;  Location: [place] CV LAB;  Service: Cardiovascular;  Laterality: N/A;  . CARDIAC SURGERY    . CARDIOVERSION N/A 03/02/2016   Procedure: CARDIOVERSION;  Surgeon: Thayer Headings, MD;  Location[facility] ENDOSCOPY;  Service: Cardiovascular;  Laterality: N/A;  . ENTEROSCOPY  02/19/2020   Procedure: ENTEROSCOPY;  Surgeon: Harvel Quale, MD;  [facility];  Service: Gastroenterology;;  . EP IMPLANTABLE DEVICE N/A 10/26/2015   Procedure: Pacemaker Implant;  Surgeon: Will Meredith Leeds, MD;  Location: [place] CV LAB;  Service: Cardiovascular;  Laterality: N/A;  . ESOPHAGOGASTRODUODENOSCOPY (EGD) WITH PROPOFOL N/A 04/07/2018   Procedure: ESOPHAGOGASTRODUODENOSCOPY (EGD) WITH PROPOFOL;  Surgeon: Jerene Bears, MD;  Location: [facility] ENDOSCOPY;  Service: Gastroenterology;  Laterality: N/A;  . ESOPHAGOGASTRODUODENOSCOPY (EGD) WITH PROPOFOL N/A 10/12/2019   Procedure: ESOPHAGOGASTRODUODENOSCOPY (EGD) WITH PROPOFOL;  Surgeon: Danie Binder, MD;  [facility];  Service: Endoscopy;  Laterality: N/A;  . EYE  SURGERY Bilateral    cataract surgery  . FLEXIBLE SIGMOIDOSCOPY  02/19/2020   Procedure: FLEXIBLE SIGMOIDOSCOPY;  Surgeon: Montez Morita, Quillian Quince, MD;  [facility];  Service: Gastroenterology;;  . HOT HEMOSTASIS N/A 04/07/2018   Procedure: HOT HEMOSTASIS (ARGON PLASMA COAGULATION/BICAP);  Surgeon: Jerene Bears, MD;  Location: [facility] ENDOSCOPY;  Service: Gastroenterology;  Laterality: N/A;  . HOT HEMOSTASIS  10/12/2019   Procedure: HOT HEMOSTASIS (ARGON PLASMA COAGULATION/BICAP);  Surgeon: Danie Binder, MD;  [facility];  Service: Endoscopy;;  . INTRAMEDULLARY (IM) NAIL INTERTROCHANTERIC Left 03/10/2020   Procedure: OPEN TREATMENT INTERNAL FIXATION LEFT HIP (WITH GAMMA NAIL);  Surgeon: Carole Civil, MD;  Location: [facility] ORS;  Service: Orthopedics;  Laterality: Left;  . NASAL SINUS SURGERY    . PACEMAKER INSERTION    . TEE WITHOUT CARDIOVERSION N/A 10/25/2015   Procedure: TRANSESOPHAGEAL ECHOCARDIOGRAM (TEE);  Surgeon: Burnell Blanks, MD;  Location: [place];  Service: Open Heart Surgery;  Laterality: N/A;  . TRANSCATHETER AORTIC VALVE REPLACEMENT, TRANSFEMORAL Right 10/25/2015  Procedure: TRANSCATHETER AORTIC VALVE REPLACEMENT, TRANSFEMORAL;  Surgeon: Burnell Blanks, MD;  Location: [place];  Service: Open Heart Surgery;  Laterality: Right;     OB History    Gravida  3   Para  3   Term  3   Preterm      AB      Living  2     SAB      IAB      Ectopic      Multiple      Live Births              Family History  Problem Relation Age of Onset  . Hypertension Mother   . Pneumonia Father   . Stomach cancer Brother   . Stroke Brother   . AAA (abdominal aortic aneurysm) Brother   . Cervical cancer Daughter   . Heart attack Neg Hx   . Colon cancer Neg Hx   . Esophageal cancer Neg Hx     Social History   Tobacco Use  . Smoking status: Never Smoker  . Smokeless tobacco: Never Used  Vaping Use  . Vaping Use: Never used   Substance Use Topics  . Alcohol use: No    Alcohol/week: 0.0 standard drinks  . Drug use: No    Home Medications Prior to Admission medications   Medication Sig Start Date End Date Taking? Authorizing Provider  albuterol (PROAIR HFA) 108 (90 Base) MCG/ACT inhaler Inhale 2 puffs into the lungs every 4 (four) hours as needed for wheezing or shortness of breath. 08/05/19   Baruch Gouty, FNP  Biotin 10 MG CAPS Take 10 mg by mouth daily.     [provider]  Budeson-Glycopyrrol-Formoterol (BREZTRI AEROSPHERE) 160-9-4.8 MCG/ACT AERO Inhale 2 Inhalers into the lungs in the morning and at bedtime. 11/27/19   Ivy Lynn, NP  Calcium Carb-Cholecalciferol (CALTRATE 600+D3) 600-800 MG-UNIT TABS Take 1 tablet by mouth daily.    [provider]  docusate sodium (COLACE) 100 MG capsule Take 1 capsule (100 mg total) by mouth 2 (two) times daily. 03/15/20   Barton Dubois, MD  EUTHYROX 25 MCG tablet Take 1 tablet by mouth once daily 03/07/20   [name], MD  famotidine (PEPCID) 20 MG tablet Take 1 tablet (20 mg total) by mouth at bedtime. 12/21/19 12/20/20  [name], MD  feeding supplement, ENSURE ENLIVE, (ENSURE ENLIVE) LIQD Take 237 mLs by mouth daily. 03/16/20   Barton Dubois, MD  folic acid (FOLVITE) 1 MG tablet Take 1 tablet (1 mg total) by mouth daily. 06/21/20   Erlene Quan, PA-C  furosemide (LASIX) 20 MG tablet Take 4 tablets (80 mg total) by mouth daily. 02/22/20   Lelon Perla, MD  iron polysaccharides (NIFEREX) 150 MG capsule Take 1 capsule (150 mg total) by mouth daily. 06/22/20   Erlene Quan, PA-C  memantine (NAMENDA) 10 MG tablet Take 1 tablet (10 mg total) by mouth 2 (two) times daily. 01/04/20   Garvin Fila, MD  metoprolol tartrate (LOPRESSOR) 25 MG tablet Take 1 tablet (25 mg total) by mouth 2 (two) times daily. 05/20/19   Camnitz, Ocie Doyne, MD  pantoprazole (PROTONIX) 40 MG tablet Take 1 tablet (40 mg total) by mouth 2 (two) times daily. 06/14/20    Hassell Done, Mary-Margaret, FNP  potassium chloride SA (KLOR-CON) 10 MEQ tablet Take 2 tablets (20 mEq total) by mouth 2 (two) times daily. Patient taking differently: Take 10 mEq by mouth 2 (two) times  daily. 02/09/20   [name], MD  traMADol (ULTRAM) 50 MG tablet Take 1 tablet (50 mg total) by mouth every 8 (eight) hours as needed for severe pain. 03/15/20   Barton Dubois, MD    Allergies    Hctz [hydrochlorothiazide], Aspirin, and Codeine  Review of Systems   Review of Systems  Constitutional: Positive for fatigue. Negative for chills and fever.  HENT: Negative for sore throat and trouble swallowing.   Respiratory: Positive for shortness of breath. Negative for cough.   Cardiovascular: Negative for chest pain and palpitations.  Gastrointestinal: Positive for blood in stool. Negative for abdominal pain, nausea and vomiting.  Genitourinary: Negative for dysuria, flank pain and hematuria.  Musculoskeletal: Negative for arthralgias, back pain, myalgias, neck pain and neck stiffness.  Skin: Negative for rash.  Neurological: Positive for weakness. Negative for dizziness, syncope, numbness and headaches.  Hematological: Does not bruise/bleed easily.    Physical Exam Updated Vital Signs BP (!) 126/55   Pulse 64   Temp 98.1 F (36.7 C) (Oral)   Resp 16   Ht 5' (1.524 m)   Wt 52.6 kg   SpO2 99%   BMI 22.65 kg/m   Physical Exam Vitals and nursing note reviewed. Exam conducted with a chaperone present.  Constitutional:      Appearance: Normal appearance. She is not ill-appearing.  HENT:     Mouth/Throat:     Mouth: Mucous membranes are moist.  Cardiovascular:     Rate and Rhythm: Normal rate and regular rhythm.     Pulses: Normal pulses.  Pulmonary:     Effort: Pulmonary effort is normal.     Breath sounds: Normal breath sounds.  Abdominal:     General: There is no distension.     Palpations: Abdomen is soft.     Tenderness: There is no abdominal tenderness.   Genitourinary:    Comments: Black heme positive stool.  No palpable rectal masses.  (pt takes iron supplement)  Musculoskeletal:        General: Normal range of motion.     Cervical back: Normal range of motion.     Right lower leg: No edema.     Left lower leg: No edema.  Skin:    General: Skin is warm.     Capillary Refill: Capillary refill takes less than 2 seconds.  Neurological:     General: No focal deficit present.     Mental Status: She is alert.     Sensory: No sensory deficit.     Motor: No weakness.     ED Results / Procedures / Treatments   Labs (all labs ordered are listed, but only abnormal results are displayed) Labs Reviewed  CBC WITH DIFFERENTIAL/PLATELET - Abnormal; Notable for the following components:      Result Value   RBC 2.87 (*)    Hemoglobin 7.4 (*)    HCT 24.5 (*)    MCH 25.8 (*)    RDW 16.6 (*)    All other components within normal limits  BASIC METABOLIC PANEL - Abnormal; Notable for the following components:   Sodium 134 (*)    Glucose, Bld 111 (*)    BUN 25 (*)    Creatinine, Ser 1.22 (*)    Calcium 8.3 (*)    GFR, Estimated 42 (*)    All other components within normal limits  HEPATIC FUNCTION PANEL - Abnormal; Notable for the following components:   Albumin 3.0 (*)    All other components within  normal limits  POC OCCULT BLOOD, ED - Abnormal; Notable for the following components:   Fecal Occult Bld POSITIVE (*)    All other components within normal limits  TYPE AND SCREEN  PREPARE RBC (CROSSMATCH)    EKG EKG Interpretation  Date/Time:  Wednesday June 22 2020 15:47:42 EST Ventricular Rate:  60 PR Interval:    QRS Duration: 157 QT Interval:  494 QTC Calculation: 494 R Axis:   -79 Text Interpretation: Atrial fibrillation Nonspecific IVCD with LAD LVH with secondary repolarization abnormality Inferior infarct, acute (RCA) Anterolateral infarct, old No significant change since last tracing Confirmed by Varney Biles [phone])  on 06/22/2020 4:15:52 PM   Radiology No results found.  Procedures Procedures (including critical care time)  Medications Ordered in ED Medications - No data to display  ED Course  I have reviewed the triage vital signs and the nursing notes.  Pertinent labs & imaging results that were available during my care of the patient were reviewed by me and considered in my medical decision making (see chart for details).    MDM Rules/Calculators/A&P                          Patient seen here by cardiology office for evaluation of hemoglobin of 7. Hemoglobin here today 7.4  This appears to be near her baseline, although daughter notes hemoglobin of 11 in September.  Daughter notes increased generalized weakness and bloody stool this morning. On review of medical records, patient received iron infusion 04/28/2020 at [facility].  She was admitted here in late August for GI bleed.   [place] Dr. Laural Golden and discussed findings.  He recommends clear liquids tonight and he will see her in the morning. Will call hospitalist.   Consulted triad hospitalist, Dr. Maurene Capes who agrees to admit.     Final Clinical Impression(s) / ED Diagnoses Final diagnoses:  Lower GI bleed  Symptomatic anemia    Rx / DC Orders ED Discharge Orders    None       Kem Parkinson, PA-C 06/22/20 1842    Truddie Hidden, MD 06/23/20 [phone]

## 2020-06-22 NOTE — H&P (Signed)
Patient Demographics:    Crystal Cooper, is a 86 y.o. female  MRN: [id number]   DOB - Jun 18, 1927  Admit Date - 06/22/2020  Outpatient Primary MD for the patient is [name], MD   Assessment & Plan:    Principal Problem:   Acute GI bleeding Active Problems:   S/P TAVR (transcatheter aortic valve replacement)   AVM (arteriovenous malformation)   PAF (paroxysmal atrial fibrillation) (HCC)   COPD (chronic obstructive pulmonary disease) (HCC)   CKD (chronic kidney disease) stage 3, GFR 30-59 ml/min (HCC)   1)Acute Gi Bleed with acute on Chronic Anemia--- -- POC +ve, -Hemoglobin is up to 7.4 from 7.0 yesterday EDP discussed case with on-call gastroenterologist Dr. Laural Golden who recommended observation overnight and possible endoluminal evaluation in a.m---please see #2 below -EDP ordered PRBC transfusion -IV Protonix as ordered Recheck H&H after transfusion  2)Duodenal arteriovenous malformation/care/GERD--- IV Protonix as ordered, GI consult as above #1  3)PAFib-- s/p PPM--- Pacemaker Placed May 2017 for post TAVR junctional bradycardia MDT duel chamber device. Dr Curt Bears follows -Continue metoprolol 25 mg twice daily, -Eliquis previously was discontinued due to GI bleed  4)status post TAVR/HFpEF-chronic diastolic dysfunction/pacemaker implantation-  -TAVR May 2017- -Echo from July 06, 2019 with EF of 60 to 63%, grade 2 diastolic dysfunction, -Monitor closely, patient at risk for volume overload after transfusion of PRBC  5)Essential hypertension--=--Stable, continue metoprolol 25 mg twice daily  6)Hypothyroidism--- continue levothyroxine 25 mcg  7)-chronic kidney disease a stage IIIb--renal function is stable -renally adjust medications, avoid nephrotoxic agents / dehydration  / hypotension  8)COPD  (chronic obstructive pulmonary disease) (HCC) -No wheezing no using accessory muscles. -Stable, no COPD flareup, continue Dulera/Incruse Ellipta .  9-Dementia without behavioral disturbance ([place]) -Patient with cognitive and memory deficits but no significant behavioral problems. -Continue the use of Namenda -Continue supportive care.  10)HypoNatremia-- Na is 134, monitor closely  ---Social--  Plan of care Discussed with daughter Ms Synetta Fail at bedside -   Disposition/Need for in-Hospital Stay- patient unable to be discharged at this time due to --- Gi bleed requiring Transfusion and possible endoluminal eval  Dispo: The patient is from: Home              Anticipated d/c is to: Home              Anticipated d/c date is: 1 day              Patient currently is not medically stable to d/c. Barriers: Not Clinically Stable-    With History of - Reviewed by me  Past Medical History:  Diagnosis Date  . AKI (acute kidney injury) ([place])   . Anemia    years ago  . Aortic stenosis   . Arthritis   . Asthma   . Atrial fibrillation ([place])   . CHF (congestive heart failure) ([place]) 11/2014  . CKD (chronic kidney disease)    had many uti this year   .  Dementia ([place])   . Family history of adverse reaction to anesthesia    2 daughters would have N/V  . Gastric AVM   . GERD (gastroesophageal reflux disease)    was seen by Gi for bleeding ulcer which was cauterized   . GI bleed   . Glaucoma   . Hearing loss   . Heart murmur    Rheumatic fever whe she was young,  sees Dr. Stanford Breed in [place]  . HTN (hypertension)   . Hypokalemia   . Myocardial infarction ([place])    in her 69 when she had two heart attacks   . Pneumonia    stayed from friday to sunday in august  . Presence of permanent cardiac pacemaker    placed about 4 years ago  . Prolapsing mitral leaflet syndrome   . Shortness of breath dyspnea    with exertion  . Stroke ([place])   . SVT (supraventricular tachycardia)  (HCC)    S/P ablation of AVNRT in 2003      Past Surgical History:  Procedure Laterality Date  . APPENDECTOMY    . BIOPSY  04/07/2018   Procedure: BIOPSY;  Surgeon: Jerene Bears, MD;  Location: [facility] ENDOSCOPY;  Service: Gastroenterology;;  . BIOPSY  02/19/2020   Procedure: BIOPSY;  Surgeon: Harvel Quale, MD;  [facility] ENDO SU[facility];  Service: Gastroenterology;;  gastric   . BLADDER SURGERY    . CARDIAC CATHETERIZATION N/A 09/26/2015   Procedure: Right/Left Heart Cath and Coronary Angiography;  Surgeon: Burnell Blanks, MD;  Location: [place] CV LAB;  Service: Cardiovascular;  Laterality: N/A;  . CARDIAC SURGERY    . CARDIOVERSION N/A 03/02/2016   Procedure: CARDIOVERSION;  Surgeon: Thayer Headings, MD;  Location: [facility] ENDOSCOPY;  Service: Cardiovascular;  Laterality: N/A;  . ENTEROSCOPY  02/19/2020   Procedure: ENTEROSCOPY;  Surgeon: Harvel Quale, MD;  Location: [facility] ENDO SUITE;  Service: Gastroenterology;;  . EP IMPLANTABLE DEVICE N/A 10/26/2015   Procedure: Pacemaker Implant;  Surgeon: Will Meredith Leeds, MD;  Location: [place] CV LAB;  Service: Cardiovascular;  Laterality: N/A;  . ESOPHAGOGASTRODUODENOSCOPY (EGD) WITH PROPOFOL N/A 04/07/2018   Procedure: ESOPHAGOGASTRODUODENOSCOPY (EGD) WITH PROPOFOL;  Surgeon: Jerene Bears, MD;  Location: [facility] ENDOSCOPY;  Service: Gastroenterology;  Laterality: N/A;  . ESOPHAGOGASTRODUODENOSCOPY (EGD) WITH PROPOFOL N/A 10/12/2019   Procedure: ESOPHAGOGASTRODUODENOSCOPY (EGD) WITH PROPOFOL;  Surgeon: Danie Binder, MD;  [facility];  Service: Endoscopy;  Laterality: N/A;  . EYE SURGERY Bilateral    cataract surgery  . FLEXIBLE SIGMOIDOSCOPY  02/19/2020   Procedure: FLEXIBLE SIGMOIDOSCOPY;  Surgeon: Montez Morita, Quillian Quince, MD;  [facility];  Service: Gastroenterology;;  . HOT HEMOSTASIS N/A 04/07/2018   Procedure: HOT HEMOSTASIS (ARGON PLASMA COAGULATION/BICAP);  Surgeon: Jerene Bears, MD;   Location: [facility] ENDOSCOPY;  Service: Gastroenterology;  Laterality: N/A;  . HOT HEMOSTASIS  10/12/2019   Procedure: HOT HEMOSTASIS (ARGON PLASMA COAGULATION/BICAP);  Surgeon: Danie Binder, MD;  [facility] ENDO [facility];  Service: Endoscopy;;  . INTRAMEDULLARY (IM) NAIL INTERTROCHANTERIC Left 03/10/2020   Procedure: OPEN TREATMENT INTERNAL FIXATION LEFT HIP (WITH GAMMA NAIL);  Surgeon: Carole Civil, MD;  Location: [facility] ORS;  Service: Orthopedics;  Laterality: Left;  . NASAL SINUS SURGERY    . PACEMAKER INSERTION    . TEE WITHOUT CARDIOVERSION N/A 10/25/2015   Procedure: TRANSESOPHAGEAL ECHOCARDIOGRAM (TEE);  Surgeon: Burnell Blanks, MD;  Location: [facility];  Service: Open Heart Surgery;  Laterality: N/A;  . TRANSCATHETER  AORTIC VALVE REPLACEMENT, TRANSFEMORAL Right 10/25/2015   Procedure: TRANSCATHETER AORTIC VALVE REPLACEMENT, TRANSFEMORAL;  Surgeon: Burnell Blanks, MD;  Location: [place];  Service: Open Heart Surgery;  Laterality: Right;   No chief complaint on file.     HPI:    Crystal Cooper  is a 86 y.o. female  With Pmhx of severe aortic stenosis--- s/p TAVR May 2017. She had aMDTpacemaker placed at that time as well for junctional bradycardia. She also had PAFand underwent a DCCVin September 2017. Shewas placedon Eliquis, later discontinued due to Gi Bleed with w/u showing duodenitis and duodenal AVM, COPD, GERD, CKDIIIb, diastolic dysfunction, HTN, -As well as h/o Dementia, hearing Loss, prior CVA 11/2019 present to ED with blood in stool -- - Pt is a poor historian due to Dementia and [place]-- History is obtained from daughter Ms Synetta Fail at bedside -  Was seen at Cardiology office yesterday by Kerin Ransom, PA and hgb was 7.0 - Repeat Hgb today is 7.4  (baseline 7 to 8) Pt takes iron pills so stool is usually dark , as per daughter there was a slight amount of blood in his stool today -No nausea no vomiting no diarrhea no abdominal pain -Patient endorses  fatigue and generalized weakness but no chest pains palpitations or dizziness  -EDP discussed case with on-call gastroenterologist Dr. Laural Golden who recommended observation overnight and possible endoluminal evaluation in a.m. -EDP ordered PRBC transfusion  POC +ve -Hemoglobin is up to 7.4 from 7.0 yesterday -Creatinine is 1.2, LFTs are not elevated -Sodium is 134, COVID-19 negative  Review of systems:    In addition to the HPI above,   A full Review of  Systems was done, all other systems reviewed are negative except as noted above in HPI , .    Social History:  Reviewed by me    Social History   Tobacco Use  . Smoking status: Never Smoker  . Smokeless tobacco: Never Used  Substance Use Topics  . Alcohol use: No    Alcohol/week: 0.0 standard drinks       Family History :  Reviewed by me    Family History  Problem Relation Age of Onset  . Hypertension Mother   . Pneumonia Father   . Stomach cancer Brother   . Stroke Brother   . AAA (abdominal aortic aneurysm) Brother   . Cervical cancer Daughter   . Heart attack Neg Hx   . Colon cancer Neg Hx   . Esophageal cancer Neg Hx      Home Medications:   Prior to Admission medications   Medication Sig Start Date End Date Taking? Authorizing Provider  albuterol (PROAIR HFA) 108 (90 Base) MCG/ACT inhaler Inhale 2 puffs into the lungs every 4 (four) hours as needed for wheezing or shortness of breath. 08/05/19  Yes RakesConnye Burkitt, FNP  Biotin 10 MG CAPS Take 10 mg by mouth daily.    Yes [provider]  Budeson-Glycopyrrol-Formoterol (BREZTRI AEROSPHERE) 160-9-4.8 MCG/ACT AERO Inhale 2 Inhalers into the lungs in the morning and at bedtime. 11/27/19  Yes Ivy Lynn, NP  Calcium Carb-Cholecalciferol (CALTRATE 600+D3) 600-800 MG-UNIT TABS Take 1 tablet by mouth daily.   Yes [provider]  docusate sodium (COLACE) 100 MG capsule Take 1 capsule (100 mg total) by mouth 2 (two) times daily. 03/15/20  Yes  Barton Dubois, MD  EUTHYROX 25 MCG tablet Take 1 tablet by mouth once daily 03/07/20  Yes [name], MD  famotidine (PEPCID) 20  MG tablet Take 1 tablet (20 mg total) by mouth at bedtime. 12/21/19 12/20/20 Yes Stacks, [name], MD  folic acid (FOLVITE) 1 MG tablet Take 1 tablet (1 mg total) by mouth daily. 06/21/20  Yes Kilroy, Luke K, PA-C  furosemide (LASIX) 20 MG tablet Take 4 tablets (80 mg total) by mouth daily. 02/22/20  Yes Lelon Perla, MD  iron polysaccharides (NIFEREX) 150 MG capsule Take 1 capsule (150 mg total) by mouth daily. 06/22/20  Yes Kilroy, Luke K, PA-C  memantine (NAMENDA) 10 MG tablet Take 1 tablet (10 mg total) by mouth 2 (two) times daily. 01/04/20  Yes Garvin Fila, MD  metoprolol tartrate (LOPRESSOR) 25 MG tablet Take 1 tablet (25 mg total) by mouth 2 (two) times daily. 05/20/19  Yes Camnitz, Will Hassell Done, MD  pantoprazole (PROTONIX) 40 MG tablet Take 1 tablet (40 mg total) by mouth 2 (two) times daily. 06/14/20  Yes Martin, Mary-Margaret, FNP  potassium chloride SA (KLOR-CON) 10 MEQ tablet Take 2 tablets (20 mEq total) by mouth 2 (two) times daily. Patient taking differently: Take 10 mEq by mouth 2 (two) times daily. 02/09/20  Yes [name], MD     Allergies:     Allergies  Allergen Reactions  . Hctz [Hydrochlorothiazide] Other (See Comments)    Pt was ill and this affected her kidneys   . Aspirin Other (See Comments)    Cardiologist said the patient is to not take this  . Codeine Other (See Comments)    Made the patient feel ill, has not had any problems since 1977     Physical Exam:   Vitals  Blood pressure (!) 156/60, pulse 66, temperature (!) 97.5 F (36.4 C), temperature source Oral, resp. rate (!) 22, height 5' (1.524 m), weight 52.6 kg, SpO2 100 %.  Physical Examination: General appearance - alert,  and in no distress a Mental status - alert, oriented to person, baseline memory and cognitive deficits Eyes - sclera  anicteric Ears---HOH Neck - supple, no JVD elevation , Chest - clear  to auscultation bilaterally, symmetrical air movement,  Heart - S1 and S2 normal, regular, PPM in situ Abdomen - soft, nontender, nondistended, no masses or organomegaly Neurological - screening mental status exam normal, neck supple without rigidity, cranial nerves II through XII intact, DTR's normal and symmetric Extremities - no pedal edema noted, intact peripheral pulses  Skin - warm, dry, old bruises noted   Data Review:    CBC Recent Labs  Lab 06/21/20 1525 06/22/20 1113  WBC 6.1 5.7  HGB 7.0* 7.4*  HCT 23.3* 24.5*  PLT 247 274  MCV 86 85.4  MCH 25.7* 25.8*  MCHC 30.0* 30.2  RDW 15.9* 16.6*  LYMPHSABS  --  0.7  MONOABS  --  0.5  EOSABS  --  0.1  BASOSABS  --  0.1   ------------------------------------------------------------------------------------------------------------------  Chemistries  Recent Labs  Lab 06/21/20 1525 06/22/20 1113  NA 136 134*  K 3.9 3.7  CL 98 99  CO2 25 26  GLUCOSE 120* 111*  BUN 25 25*  CREATININE 1.26* 1.22*  CALCIUM 8.1* 8.3*  AST  --  23  ALT  --  13  ALKPHOS  --  122  BILITOT  --  0.5   ------------------------------------------------------------------------------------------------------------------ estimated creatinine clearance is 21.6 mL/min (A) (by C-G formula based on SCr of 1.22 mg/dL (H)). ------------------------------------------------------------------------------------------------------------------ No results for input(s): TSH, T4TOTAL, T3FREE, THYROIDAB in the last 72 hours.  Invalid input(s): FREET3   Coagulation profile No  results for input(s): INR, PROTIME in the last 168 hours. ------------------------------------------------------------------------------------------------------------------- No results for input(s): DDIMER in the last 72  hours. -------------------------------------------------------------------------------------------------------------------  Cardiac Enzymes No results for input(s): CKMB, TROPONINI, MYOGLOBIN in the last 168 hours.  Invalid input(s): CK ------------------------------------------------------------------------------------------------------------------    Component Value Date/Time   BNP 313.0 (H) 02/12/2020 0752     ---------------------------------------------------------------------------------------------------------------  Urinalysis    Component Value Date/Time   COLORURINE YELLOW 02/12/2020 1353   APPEARANCEUR CLEAR 02/12/2020 1353   APPEARANCEUR Hazy (A) 02/09/2020 1400   LABSPEC 1.012 02/12/2020 1353   PHURINE 7.0 02/12/2020 1353   GLUCOSEU NEGATIVE 02/12/2020 1353   HGBUR NEGATIVE 02/12/2020 [place] 02/12/2020 1353   BILIRUBINUR Negative 02/09/2020 1400   KETONESUR NEGATIVE 02/12/2020 1353   PROTEINUR NEGATIVE 02/12/2020 1353   NITRITE NEGATIVE 02/12/2020 1353   LEUKOCYTESUR NEGATIVE 02/12/2020 1353     Imaging Results:    No results found.  Radiological Exams on Admission: No results found.  DVT Prophylaxis -SCD AM Labs Ordered, also please review Full Orders  Family Communication: Admission, patients condition and plan of care including tests being ordered have been discussed with the patient and daughter Quita Skye who indicate understanding and agree with the plan   Code Status - Full Code  Likely DC to  home  Condition   stable  Roxan Hockey M.D on 06/22/2020 at 9:05 PM Go to [email or web address] -  for contact info  [facility] - Office  [phone]

## 2020-06-22 NOTE — Telephone Encounter (Signed)
Nu Iron called in- I decreased the dose to 150 mg daily.  Kerin Ransom PA-C 06/22/2020 8:40 AM

## 2020-06-22 NOTE — Progress Notes (Signed)
Patient just arrived on unit with blood infusing. Vital signs stable. Patient alert and oriented x3

## 2020-06-22 NOTE — ED Notes (Signed)
Per daughter pt noted blood in her stool this morning.

## 2020-06-23 DIAGNOSIS — K922 Gastrointestinal hemorrhage, unspecified: Secondary | ICD-10-CM | POA: Diagnosis not present

## 2020-06-23 DIAGNOSIS — D500 Iron deficiency anemia secondary to blood loss (chronic): Secondary | ICD-10-CM | POA: Diagnosis not present

## 2020-06-23 DIAGNOSIS — Q2730 Arteriovenous malformation, site unspecified: Secondary | ICD-10-CM

## 2020-06-23 DIAGNOSIS — J439 Emphysema, unspecified: Secondary | ICD-10-CM

## 2020-06-23 DIAGNOSIS — D649 Anemia, unspecified: Secondary | ICD-10-CM | POA: Diagnosis not present

## 2020-06-23 LAB — TYPE AND SCREEN
ABO/RH(D): A POS
Antibody Screen: NEGATIVE
Unit division: 0
Unit division: 0

## 2020-06-23 LAB — CBC
HCT: 37.9 % (ref 36.0–46.0)
Hemoglobin: 12.1 g/dL (ref 12.0–15.0)
MCH: 26.9 pg (ref 26.0–34.0)
MCHC: 31.9 g/dL (ref 30.0–36.0)
MCV: 84.2 fL (ref 80.0–100.0)
Platelets: 261 10*3/uL (ref 150–400)
RBC: 4.5 MIL/uL (ref 3.87–5.11)
RDW: 15.3 % (ref 11.5–15.5)
WBC: 8.4 10*3/uL (ref 4.0–10.5)
nRBC: 0 % (ref 0.0–0.2)

## 2020-06-23 LAB — RENAL FUNCTION PANEL
Albumin: 3.3 g/dL — ABNORMAL LOW (ref 3.5–5.0)
Anion gap: 11 (ref 5–15)
BUN: 21 mg/dL (ref 8–23)
CO2: 26 mmol/L (ref 22–32)
Calcium: 8.8 mg/dL — ABNORMAL LOW (ref 8.9–10.3)
Chloride: 97 mmol/L — ABNORMAL LOW (ref 98–111)
Creatinine, Ser: 1.09 mg/dL — ABNORMAL HIGH (ref 0.44–1.00)
GFR, Estimated: 48 mL/min — ABNORMAL LOW (ref >60)
Glucose, Bld: 117 mg/dL — ABNORMAL HIGH (ref 70–99)
Phosphorus: 3.4 mg/dL (ref 2.5–4.6)
Potassium: 3.4 mmol/L — ABNORMAL LOW (ref 3.5–5.1)
Sodium: 134 mmol/L — ABNORMAL LOW (ref 135–145)

## 2020-06-23 LAB — BPAM RBC
Blood Product Expiration Date: 202201242359
Blood Product Expiration Date: 202201242359
ISSUE DATE / TIME: 202112291535
ISSUE DATE / TIME: 202112291741
Unit Type and Rh: 6200
Unit Type and Rh: 6200

## 2020-06-23 LAB — HEMOGLOBIN AND HEMATOCRIT, BLOOD
HCT: 37.2 % (ref 36.0–46.0)
Hemoglobin: 11.8 g/dL — ABNORMAL LOW (ref 12.0–15.0)

## 2020-06-23 MED ORDER — POTASSIUM CHLORIDE 20 MEQ PO PACK: 20.0000 meq | PACK | Freq: Every day | ORAL | Status: DC

## 2020-06-23 MED ORDER — POTASSIUM CHLORIDE CRYS ER 20 MEQ PO TBCR: 20.0000 meq | EXTENDED_RELEASE_TABLET | Freq: Once | ORAL | Status: DC

## 2020-06-23 MED ORDER — POTASSIUM CHLORIDE 20 MEQ PO PACK
40.0000 meq | PACK | Freq: Once | ORAL | Status: AC
  Administered 2020-06-23: 09:00:00 40 meq via ORAL
  Filled 2020-06-23: qty 2

## 2020-06-23 NOTE — Progress Notes (Signed)
Pt discharged home. Discharge teaching given to pt and daughter who was present bedside. No further questions at this time. Pt taken down to daughter car via w/c.

## 2020-06-23 NOTE — Discharge Summary (Signed)
Physician Discharge Summary  Crystal Cooper:[id number] DOB: [birth year] DOA: 06/22/2020  PCP: [name], MD  Admit date: 06/22/2020 Discharge date: 06/23/2020  Admitted From: Home  Disposition:  Home   Recommendations for Outpatient Follow-up:  1. Follow up with PCP Dr. Livia Snellen in 1 week 2. Dr. Livia Snellen: Please obtain CBC in 1 week to follow Hgb and BMP to repeat K level 3. Follow up with GI, Dr. Hilarie Fredrickson in 4-6 weeks     Home Health: None  Equipment/Devices: None  Discharge Condition: Good  CODE STATUS: FULL Diet recommendation: Cardiac  Brief/Interim Summary: Crystal Cooper is a 86 y.o. F with vascular dementia, dCHF, bradycardia s/p PPM 2017, hx TAVR, chronic IDA and AoCKD, hx CVA, CKD IIIb, HTN, pAF and hx recurrent GIB now off anticoagulation who presented with abnormal labs.  Family hadn't particularly noted blood in stool, but seen in Cardiology office for routine visit day before admission, noted to be pale, have had some exertional SOB relieved with inhaler.  There Hgb measured at 7, so sent to ER.  In addition, from H&P: "There have been a couple of attempts at resuming her Eliquis.but she was evetually taken off anticoagulation permanently. In addition to the above she has dementia, is [facility], and has had a stoke June 2021.  Since then she has also had problems with URI and CAP as well as an admission for recurrent GI bleeding and in Sept 2021 she was admitted to [facility] for fractured Lt hip.  Cardiology did not see her during that admission.  She was discharged to in patient rehab and returned home 05/27/2020."         [place] DIAGNOSIS: Subacute blood loss anemia    Discharge Diagnoses:   Subacute blood loss anemia on chronic iron deficiency anemia from chronic GI blood loss Patient with Hgb 7.4 on admission.  Transfused 2 units overnight for symptomatic anemia.  This morning had one small dark BM, but none further, and showed complete hemodynamic  stability.    Repeat Hgb up to 11.4-12 g/dL.  Clinical picture not consistent with an unstable or large GI bleed, suspect she has had slow bleeding over weeks.    Given return precautions, recommended follow up with Dr. Hilarie Fredrickson.   Dementia History stroke   COPD No active flare Continue ICS/LABA/LAMA  CKD IIIb Cr stable relative to baseline  Paroxysmal atrial fibrillation Not on [facility] for several years and per report has had recurrent bleeding with any attempt to restart it.     Chronic diastolic CHF Pacemaker in place TAVR Appeared euvolemic  Hypothyroidism Continue levothyroxine  Hypokalemia K low after post-transfusion furosemide last night.  Repleted.              Discharge Instructions  Discharge Instructions    Diet - low sodium heart healthy   Complete by: As directed    Discharge instructions   Complete by: As directed    From Dr. Loleta Books: You were admitted for anemia You received a transfusion and your blood level improved.    You should continue your home iron  You should continue all your other home medicines without change.  Go see Dr. Livia Snellen in 1 week and call your gastroenterologist for an appointment to follow up in 2-4 weeks   Increase activity slowly   Complete by: As directed      Allergies as of 06/23/2020      Reactions   Hctz [hydrochlorothiazide] Other (See Comments)   Pt was ill and  this affected her kidneys   Aspirin Other (See Comments)   Cardiologist said the patient is to not take this   Codeine Other (See Comments)   Made the patient feel ill, has not had any problems since 1977      Medication List    TAKE these medications   albuterol 108 (90 Base) MCG/ACT inhaler Commonly known as: ProAir HFA Inhale 2 puffs into the lungs every 4 (four) hours as needed for wheezing or shortness of breath.   Biotin 10 MG Caps Take 10 mg by mouth daily.   Breztri Aerosphere 160-9-4.8 MCG/ACT Aero Generic drug:  Budeson-Glycopyrrol-Formoterol Inhale 2 Inhalers into the lungs in the morning and at bedtime.   Caltrate 600+D3 600-800 MG-UNIT Tabs Generic drug: Calcium Carb-Cholecalciferol Take 1 tablet by mouth daily.   docusate sodium 100 MG capsule Commonly known as: COLACE Take 1 capsule (100 mg total) by mouth 2 (two) times daily.   Euthyrox 25 MCG tablet Generic drug: levothyroxine Take 1 tablet by mouth once daily   famotidine 20 MG tablet Commonly known as: Pepcid Take 1 tablet (20 mg total) by mouth at bedtime.   folic acid 1 MG tablet Commonly known as: FOLVITE Take 1 tablet (1 mg total) by mouth daily.   furosemide 20 MG tablet Commonly known as: LASIX Take 4 tablets (80 mg total) by mouth daily.   iron polysaccharides 150 MG capsule Commonly known as: NIFEREX Take 1 capsule (150 mg total) by mouth daily.   memantine 10 MG tablet Commonly known as: NAMENDA Take 1 tablet (10 mg total) by mouth 2 (two) times daily.   metoprolol tartrate 25 MG tablet Commonly known as: LOPRESSOR Take 1 tablet (25 mg total) by mouth 2 (two) times daily.   pantoprazole 40 MG tablet Commonly known as: PROTONIX Take 1 tablet (40 mg total) by mouth 2 (two) times daily.   potassium chloride 10 MEQ tablet Commonly known as: KLOR-CON Take 2 tablets (20 mEq total) by mouth 2 (two) times daily. What changed: how much to take       Follow-up Information    [name], MD. Schedule an appointment as soon as possible for a visit in 1 week(s).   Specialty: Family Medicine Contact information: [address] [place] [address] [phone]              Allergies  Allergen Reactions  . Hctz [Hydrochlorothiazide] Other (See Comments)    Pt was ill and this affected her kidneys   . Aspirin Other (See Comments)    Cardiologist said the patient is to not take this  . Codeine Other (See Comments)    Made the patient feel ill, has not had any problems since 1977     Consultations:  Gastroenterology   Procedures/Studies: CUP [place]  Result Date: 05/30/2020 Scheduled remote reviewed. Normal device function.  Known persistent AF, not OAC candidate. Next remote 91 days. HB  DG HIP UNILAT WITH PELVIS 2-3 VIEWS LEFT  Result Date: 06/06/2020 Radiology report on pelvis and left hip x-rays AP and lateral after hip fracture with internal fixation The hip and pelvic x-rays show a short gamma nail with a distal locking screw.  Hardware is intact without complication Tip to apex distance is within the range of 24 mm The fracture has healed Impression healed fracture without complications left hip inner troches fracture      Subjective: One small BM overnight, no hematochezia, no syncope, no dizziness, no confusion, no  dyspnea with exertion.  No fever, no vomiting.  Discharge Exam: Vitals:   06/23/20 0158 06/23/20 0639  BP: (!) 163/66 (!) 150/80  Pulse: 63 72  Resp:  20  Temp:  97.6 F (36.4 C)  SpO2:  95%   Vitals:   06/22/20 2152 06/23/20 0157 06/23/20 0158 06/23/20 0639  BP: (!) 141/79 (!) 157/94 (!) 163/66 (!) 150/80  Pulse: 64 68 63 72  Resp: 20 16  20   Temp: 97.6 F (36.4 C) 97.7 F (36.5 C)  97.6 F (36.4 C)  TempSrc: Oral     SpO2: 97% 94%  95%  Weight:      Height:        General: Pt is alert, awake, not in acute distress, sitting in recliner, interactive, pleasant Cardiovascular: RRR, nl S1-S2, no murmurs appreciated.   No LE edema.   Respiratory: Normal respiratory rate and rhythm.  CTAB without rales or wheezes. Abdominal: Abdomen soft and non-tender.  No distension or HSM.   Neuro/Psych: Strength symmetric in upper and lower extremities.  Judgment and insight appear moderately impaired by dementia.   The results of significant diagnostics from this hospitalization (including imaging, microbiology, ancillary and laboratory) are listed below for reference.     Microbiology: Recent Results (from  the past 240 hour(s))  Resp Panel by RT-PCR (Flu A&B, Covid) Nasopharyngeal Swab     Status: None   Collection Time: 06/22/20  6:05 PM   Specimen: Nasopharyngeal Swab; Nasopharyngeal(NP) swabs in vial transport medium  Result Value Ref Range Status   SARS Coronavirus 2 by RT PCR NEGATIVE NEGATIVE Final    Comment: (NOTE) SARS-CoV-2 target nucleic acids are NOT DETECTED.  The SARS-CoV-2 RNA is generally detectable in upper respiratory specimens during the acute phase of infection. The lowest concentration of SARS-CoV-2 viral copies this assay can detect is 138 copies/mL. A negative result does not preclude SARS-Cov-2 infection and should not be used as the sole basis for treatment or other patient management decisions. A negative result may occur with  improper specimen collection/handling, submission of specimen other than nasopharyngeal swab, presence of viral mutation(s) within the areas targeted by this assay, and inadequate number of viral copies(<138 copies/mL). A negative result must be combined with clinical observations, patient history, and epidemiological information. The expected result is Negative.  Fact Sheet for Patients:  EntrepreneurPulse.com.au  Fact Sheet for Healthcare Providers:  IncredibleEmployment.be  This test is no t yet approved or cleared by the [place] FDA and  has been authorized for detection and/or diagnosis of SARS-CoV-2 by FDA under an Emergency Use Authorization (EUA). This EUA will remain  in effect (meaning this test can be used) for the duration of the COVID-19 declaration under Section 564(b)(1) of the Act, 21 U.S.C.section 360bbb-3(b)(1), unless the authorization is terminated  or revoked sooner.       Influenza A by PCR NEGATIVE NEGATIVE Final   Influenza B by PCR NEGATIVE NEGATIVE Final    Comment: (NOTE) The Xpert Xpress SARS-CoV-2/FLU/RSV plus assay is intended as an aid in the diagnosis of  influenza from Nasopharyngeal swab specimens and should not be used as a sole basis for treatment. Nasal washings and aspirates are unacceptable for Xpert Xpress SARS-CoV-2/FLU/RSV testing.  Fact Sheet for Patients: EntrepreneurPulse.com.au  Fact Sheet for Healthcare Providers: IncredibleEmployment.be  This test is not yet approved or cleared by the [place] FDA and has been authorized for detection and/or diagnosis of SARS-CoV-2 by FDA under an Emergency Use Authorization (EUA). This EUA  will remain in effect (meaning this test can be used) for the duration of the COVID-19 declaration under Section 564(b)(1) of the Act, 21 U.S.C. section 360bbb-3(b)(1), unless the authorization is terminated or revoked.  Performed at [facility], [address], [place], [place] [address]      Labs: BNP (last 3 results) Recent Labs    08/26/19 1135 12/13/19 0625 02/12/20 0752  BNP 483.0* 468.0* 854.6*   Basic Metabolic Panel: Recent Labs  Lab 06/21/20 1525 06/22/20 1113 06/23/20 0446  NA 136 134* 134*  K 3.9 3.7 3.4*  CL 98 99 97*  CO2 25 26 26   GLUCOSE 120* 111* 117*  BUN 25 25* 21  CREATININE 1.26* 1.22* 1.09*  CALCIUM 8.1* 8.3* 8.8*  PHOS  --   --  3.4   Liver Function Tests: Recent Labs  Lab 06/22/20 1113 06/23/20 0446  AST 23  --   ALT 13  --   ALKPHOS 122  --   BILITOT 0.5  --   PROT 6.5  --   ALBUMIN 3.0* 3.3*   No results for input(s): LIPASE, AMYLASE in the last 168 hours. No results for input(s): AMMONIA in the last 168 hours. CBC: Recent Labs  Lab 06/21/20 1525 06/22/20 1113 06/23/20 0446 06/23/20 0820  WBC 6.1 5.7 8.4  --   NEUTROABS  --  4.4  --   --   HGB 7.0* 7.4* 12.1 11.8*  HCT 23.3* 24.5* 37.9 37.2  MCV 86 85.4 84.2  --   PLT 247 274 261  --    Cardiac Enzymes: No results for input(s): CKTOTAL, CKMB, CKMBINDEX, TROPONINI in the last 168 hours. BNP: Invalid input(s): POCBNP CBG: No results for  input(s): GLUCAP in the last 168 hours. D-Dimer No results for input(s): DDIMER in the last 72 hours. Hgb A1c No results for input(s): HGBA1C in the last 72 hours. Lipid Profile No results for input(s): CHOL, HDL, LDLCALC, TRIG, CHOLHDL, LDLDIRECT in the last 72 hours. Thyroid function studies No results for input(s): TSH, T4TOTAL, T3FREE, THYROIDAB in the last 72 hours.  Invalid input(s): FREET3 Anemia work up No results for input(s): VITAMINB12, FOLATE, FERRITIN, TIBC, IRON, RETICCTPCT in the last 72 hours. Urinalysis    Component Value Date/Time   COLORURINE YELLOW 02/12/2020 1353   APPEARANCEUR CLEAR 02/12/2020 1353   APPEARANCEUR Hazy (A) 02/09/2020 1400   LABSPEC 1.012 02/12/2020 1353   PHURINE 7.0 02/12/2020 1353   GLUCOSEU NEGATIVE 02/12/2020 1353   HGBUR NEGATIVE 02/12/2020 1353   BILIRUBINUR NEGATIVE 02/12/2020 1353   BILIRUBINUR Negative 02/09/2020 1400   KETONESUR NEGATIVE 02/12/2020 1353   PROTEINUR NEGATIVE 02/12/2020 1353   NITRITE NEGATIVE 02/12/2020 1353   LEUKOCYTESUR NEGATIVE 02/12/2020 1353   Sepsis Labs Invalid input(s): PROCALCITONIN,  WBC,  LACTICIDVEN Microbiology Recent Results (from the past 240 hour(s))  Resp Panel by RT-PCR (Flu A&B, Covid) Nasopharyngeal Swab     Status: None   Collection Time: 06/22/20  6:05 PM   Specimen: Nasopharyngeal Swab; Nasopharyngeal(NP) swabs in vial transport medium  Result Value Ref Range Status   SARS Coronavirus 2 by RT PCR NEGATIVE NEGATIVE Final    Comment: (NOTE) SARS-CoV-2 target nucleic acids are NOT DETECTED.  The SARS-CoV-2 RNA is generally detectable in upper respiratory specimens during the acute phase of infection. The lowest concentration of SARS-CoV-2 viral copies this assay can detect is 138 copies/mL. A negative result does not preclude SARS-Cov-2 infection and should not be used as the sole basis for treatment or other patient  management decisions. A negative result may occur with  improper  specimen collection/handling, submission of specimen other than nasopharyngeal swab, presence of viral mutation(s) within the areas targeted by this assay, and inadequate number of viral copies(<138 copies/mL). A negative result must be combined with clinical observations, patient history, and epidemiological information. The expected result is Negative.  Fact Sheet for Patients:  EntrepreneurPulse.com.au  Fact Sheet for Healthcare Providers:  IncredibleEmployment.be  This test is no t yet approved or cleared by the [place] FDA and  has been authorized for detection and/or diagnosis of SARS-CoV-2 by FDA under an Emergency Use Authorization (EUA). This EUA will remain  in effect (meaning this test can be used) for the duration of the COVID-19 declaration under Section 564(b)(1) of the Act, 21 U.S.C.section 360bbb-3(b)(1), unless the authorization is terminated  or revoked sooner.       Influenza A by PCR NEGATIVE NEGATIVE Final   Influenza B by PCR NEGATIVE NEGATIVE Final    Comment: (NOTE) The Xpert Xpress SARS-CoV-2/FLU/RSV plus assay is intended as an aid in the diagnosis of influenza from Nasopharyngeal swab specimens and should not be used as a sole basis for treatment. Nasal washings and aspirates are unacceptable for Xpert Xpress SARS-CoV-2/FLU/RSV testing.  Fact Sheet for Patients: EntrepreneurPulse.com.au  Fact Sheet for Healthcare Providers: IncredibleEmployment.be  This test is not yet approved or cleared by the [place] FDA and has been authorized for detection and/or diagnosis of SARS-CoV-2 by FDA under an Emergency Use Authorization (EUA). This EUA will remain in effect (meaning this test can be used) for the duration of the COVID-19 declaration under Section 564(b)(1) of the Act, 21 U.S.C. section 360bbb-3(b)(1), unless the authorization is terminated or revoked.  Performed at  [facility], [address] [place], [place] [address]      Time coordinating discharge: 35 minutes The [place] controlled substances registry was reviewed for this patient      SIGNED:   Edwin Dada, MD  [facility] 06/23/2020, 1:31 PM

## 2020-06-23 NOTE — TOC Transition Note (Signed)
Transition of Care [facility]) - CM/SW Discharge Note   Patient Details  Name: Crystal Cooper MRN: [id number] Date of Birth: 07-Nov-1926  Transition of Care [facility]) CM/SW Contact:  Boneta Lucks, RN Phone Number: 06/23/2020, 2:07 PM   Clinical Narrative:   Patient admitted in OBS forGI bleed. Patient is active with [facility]. Patient concerned about losing her aid, with hospital admission. Explain OBS status and updated Georgina Snell with [facility].     Final next level of care: [place] Barriers to Discharge: No Barriers Identified   Patient Goals and CMS Choice Patient states their goals for this hospitalization and ongoing recovery are:: to go home. CMS Medicare.gov Compare Post Acute Care list provided to:: Patient Represenative (must comment) Choice offered to / list presented to : Adult Children  Discharge Placement                Patient to be transferred to facility by: [name] Name of family member notified: [place] Patient and family notified of of transfer: 06/23/20  Discharge Plan and Services    Readmission Risk Interventions Readmission Risk Prevention Plan 10/11/2019  Transportation Screening Complete  HRI or Home Care Consult Complete  Palliative Care Screening Not Applicable  Medication Review (RN Care Manager) Complete  Some recent data might be hidden

## 2020-06-23 NOTE — Consult Note (Addendum)
Referring Provider: [facility] Primary Care Physician:  [name], MD Primary Gastroenterologist:  Dr. Hilarie Fredrickson  Date of Admission: 06/22/20 Date of Consultation: 06/23/20  Reason for Consultation:  GI Bleed, acute on chronic anemia  HPI:  Crystal Cooper is a 86 y.o. female with a past medical history of AKI, aortic stenosis status post TAVR, A. fib on chronic anticoagulation, CHF, CKD, dementia, GERD, hypertension, MI, CVA, SVT.  The patient was last seen in the GI office 12/18/2019 for IDA on chronic anticoagulation and angiodysplasia of the intestinal tract.  Overall IDA due to chronic blood loss and history of gastric angiectasia/chronic anticoagulation.  This is felt to be exacerbated by Eliquis.  Continued anticoagulation due to risk versus benefit analysis felt beneficial to remain on Eliquis from CVA standpoint.  Further GI evaluation with colonoscopy felt to be too risky at that time.  Recommended close monitoring of blood counts and iron studies, recommend hematology referral for repeat IV iron as needed.  Last complete colonoscopy 08/19/2006 which found scattered sigmoid diverticula and two 3 mm polyps at hepatic flexure, 2 in the descending colon.  Surgical pathology found the polyps to be hyperplastic (hepatic flexure) and serrated adenoma (descending colon).  The patient was also admitted for GI bleed this year from 02/18/2020 through 02/21/2020.  She had a hemoglobin drop from 10.59.1 on the day of admission and was started on IV Protonix.  EGD completed 425 found oozing gastric ulcer with adherent clot treated with bipolar cautery and a flexible sigmoidoscopy found extensive stool from the anus to transverse colon making visualization difficult.  Stools noted to be brown and black but no evidence of fresh blood.  She presented to the emergency department yesterday after a routine cardiology appointment found hemoglobin of 7 and advised to come the emergency department.   Noted increasing generalized weakness, fatigue, and episode of shortness of breath at night as well as an episode of bloody stool that morning.  Due to dementia the patient is unable to provide any history.  Not currently anticoagulated due to her previous GI bleed.  In the emergency room her vitals are stable, fecal occult blood positive.  Hemoglobin found to be 7.4 which appears to be near her recent baseline, although daughter indicates hemoglobin of 11 in September.  Review of recent labs finds a single value of 10.7 on 03/09/2020 and 9.5 on 03/10/2020, otherwise she appears to be in the hemoglobin of 7-9 range since March of this year.  Of note, she is on Protonix 40 mg bid at home. No longer on Eliquis per home medication reconciliation.  Today she is pleasantly confused but able to contribute to some portions of her history. States she saw red blood in her stool a couple days ago, no more since. Denies melena, GERD symptoms, dysphagia.  Does have some chronic shortness of breath but appears to be no worse than baseline.  She does have shortness of breath but this seems to be chronic and she appears near baseline.  She states she is feeling okay this morning.  States that she has previously had bleeding problems before.  She notes her blood level is low.  Denies abdominal pain, nausea, vomiting.  No other overt GI complaints.  She gave expressed permission to discuss her care with her daughter.  Past Medical History:  Diagnosis Date  . AKI (acute kidney injury) ([place])   . Anemia    years ago  . Aortic stenosis   . Arthritis   .  Asthma   . Atrial fibrillation ([place])   . CHF (congestive heart failure) ([place]) 11/2014  . CKD (chronic kidney disease)    had many uti this year   . Dementia ([place])   . Family history of adverse reaction to anesthesia    2 daughters would have N/V  . Gastric AVM   . GERD (gastroesophageal reflux disease)    was seen by Gi for bleeding ulcer which was cauterized   .  GI bleed   . Glaucoma   . Hearing loss   . Heart murmur    Rheumatic fever whe she was young,  sees Dr. Stanford Breed in [place]  . HTN (hypertension)   . Hypokalemia   . Myocardial infarction ([place])    in her 4 when she had two heart attacks   . Pneumonia    stayed from friday to sunday in august  . Presence of permanent cardiac pacemaker    placed about 4 years ago  . Prolapsing mitral leaflet syndrome   . Shortness of breath dyspnea    with exertion  . Stroke ([place])   . SVT (supraventricular tachycardia) ([facility])    S/P ablation of AVNRT in 2003    Past Surgical History:  Procedure Laterality Date  . APPENDECTOMY    . BIOPSY  04/07/2018   Procedure: BIOPSY;  Surgeon: Jerene Bears, MD;  Location: [facility] ENDOSCOPY;  Service: Gastroenterology;;  . BIOPSY  02/19/2020   Procedure: BIOPSY;  Surgeon: Harvel Quale, MD;  [facility] END[facility] SU[facility];  Service: Gastroenterology;;  gastric   . BLADDER SURGERY    . CARDIAC CATHETERIZATION N/A 09/26/2015   Procedure: Right/Left Heart Cath and Coronary Angiography;  Surgeon: Burnell Blanks, MD;  Location: [place] CV LAB;  Service: Cardiovascular;  Laterality: N/A;  . CARDIAC SURGERY    . CARDIOVERSION N/A 03/02/2016   Procedure: CARDIOVERSION;  Surgeon: Thayer Headings, MD;  Location: [facility] ENDOSCOPY;  Service: Cardiovascular;  Laterality: N/A;  . ENTEROSCOPY  02/19/2020   Procedure: ENTEROSCOPY;  Surgeon: Harvel Quale, MD;  [facility] ENDO SU[facility];  Service: Gastroenterology;;  . EP IMPLANTABLE DEVICE N/A 10/26/2015   Procedure: Pacemaker Implant;  Surgeon: Will Meredith Leeds, MD;  Location: [place] CV LAB;  Service: Cardiovascular;  Laterality: N/A;  . ESOPHAGOGASTRODUODENOSCOPY (EGD) WITH PROPOFOL N/A 04/07/2018   Procedure: ESOPHAGOGASTRODUODENOSCOPY (EGD) WITH PROPOFOL;  Surgeon: Jerene Bears, MD;  Location: [facility] ENDOSCOPY;  Service: Gastroenterology;  Laterality: N/A;  . ESOPHAGOGASTRODUODENOSCOPY (EGD) WITH  PROPOFOL N/A 10/12/2019   Procedure: ESOPHAGOGASTRODUODENOSCOPY (EGD) WITH PROPOFOL;  Surgeon: Danie Binder, MD;  [facility];  Service: Endoscopy;  Laterality: N/A;  . EYE SURGERY Bilateral    cataract surgery  . FLEXIBLE SIGMOIDOSCOPY  02/19/2020   Procedure: FLEXIBLE SIGMOIDOSCOPY;  Surgeon: Montez Morita, Quillian Quince, MD;  [facility];  Service: Gastroenterology;;  . HOT HEMOSTASIS N/A 04/07/2018   Procedure: HOT HEMOSTASIS (ARGON PLASMA COAGULATION/BICAP);  Surgeon: Jerene Bears, MD;  Location: [facility] ENDOSCOPY;  Service: Gastroenterology;  Laterality: N/A;  . HOT HEMOSTASIS  10/12/2019   Procedure: HOT HEMOSTASIS (ARGON PLASMA COAGULATION/BICAP);  Surgeon: Danie Binder, MD;  [facility];  Service: Endoscopy;;  . INTRAMEDULLARY (IM) NAIL INTERTROCHANTERIC Left 03/10/2020   Procedure: OPEN TREATMENT INTERNAL FIXATION LEFT HIP (WITH GAMMA NAIL);  Surgeon: Carole Civil, MD;  Location: [facility] ORS;  Service: Orthopedics;  Laterality: Left;  . NASAL SINUS SURGERY    . PACEMAKER INSERTION    . TEE  WITHOUT CARDIOVERSION N/A 10/25/2015   Procedure: TRANSESOPHAGEAL ECHOCARDIOGRAM (TEE);  Surgeon: Burnell Blanks, MD;  Location: [place];  Service: Open Heart Surgery;  Laterality: N/A;  . TRANSCATHETER AORTIC VALVE REPLACEMENT, TRANSFEMORAL Right 10/25/2015   Procedure: TRANSCATHETER AORTIC VALVE REPLACEMENT, TRANSFEMORAL;  Surgeon: Burnell Blanks, MD;  Location: [place];  Service: Open Heart Surgery;  Laterality: Right;    Prior to Admission medications   Medication Sig Start Date End Date Taking? Authorizing Provider  albuterol (PROAIR HFA) 108 (90 Base) MCG/ACT inhaler Inhale 2 puffs into the lungs every 4 (four) hours as needed for wheezing or shortness of breath. 08/05/19  Yes RakesConnye Burkitt, FNP  Biotin 10 MG CAPS Take 10 mg by mouth daily.    Yes [provider]  Budeson-Glycopyrrol-Formoterol (BREZTRI AEROSPHERE) 160-9-4.8 MCG/ACT AERO  Inhale 2 Inhalers into the lungs in the morning and at bedtime. 11/27/19  Yes Ivy Lynn, NP  Calcium Carb-Cholecalciferol (CALTRATE 600+D3) 600-800 MG-UNIT TABS Take 1 tablet by mouth daily.   Yes [provider]  docusate sodium (COLACE) 100 MG capsule Take 1 capsule (100 mg total) by mouth 2 (two) times daily. 03/15/20  Yes Barton Dubois, MD  EUTHYROX 25 MCG tablet Take 1 tablet by mouth once daily 03/07/20  Yes Stacks, [name], MD  famotidine (PEPCID) 20 MG tablet Take 1 tablet (20 mg total) by mouth at bedtime. 12/21/19 12/20/20 Yes Stacks, [name], MD  folic acid (FOLVITE) 1 MG tablet Take 1 tablet (1 mg total) by mouth daily. 06/21/20  Yes Kilroy, Luke K, PA-C  furosemide (LASIX) 20 MG tablet Take 4 tablets (80 mg total) by mouth daily. 02/22/20  Yes Lelon Perla, MD  iron polysaccharides (NIFEREX) 150 MG capsule Take 1 capsule (150 mg total) by mouth daily. 06/22/20  Yes Kilroy, Luke K, PA-C  memantine (NAMENDA) 10 MG tablet Take 1 tablet (10 mg total) by mouth 2 (two) times daily. 01/04/20  Yes Garvin Fila, MD  metoprolol tartrate (LOPRESSOR) 25 MG tablet Take 1 tablet (25 mg total) by mouth 2 (two) times daily. 05/20/19  Yes Camnitz, Will Hassell Done, MD  pantoprazole (PROTONIX) 40 MG tablet Take 1 tablet (40 mg total) by mouth 2 (two) times daily. 06/14/20  Yes Martin, Mary-Margaret, FNP  potassium chloride SA (KLOR-CON) 10 MEQ tablet Take 2 tablets (20 mEq total) by mouth 2 (two) times daily. Patient taking differently: Take 10 mEq by mouth 2 (two) times daily. 02/09/20  Yes [name], MD    Current Facility-Administered Medications  Medication Dose Route Frequency Provider Last Rate Last Admin  . 0.9 %  sodium chloride infusion  250 mL Intravenous PRN Emokpae, Courage, MD      . acetaminophen (TYLENOL) tablet 650 mg  650 mg Oral Q6H PRN Emokpae, Courage, MD       Or  . acetaminophen (TYLENOL) suppository 650 mg  650 mg Rectal Q6H PRN Emokpae, Courage, MD      .  albuterol (VENTOLIN HFA) 108 (90 Base) MCG/ACT inhaler 2 puff  2 puff Inhalation Q4H PRN Emokpae, Courage, MD      . bisacodyl (DULCOLAX) suppository 10 mg  10 mg Rectal Daily PRN Emokpae, Courage, MD      . famotidine (PEPCID) tablet 20 mg  20 mg Oral QHS Emokpae, Courage, MD   20 mg at 06/22/20 1915  . feeding supplement (ENSURE ENLIVE / ENSURE PLUS) liquid 237 mL  237 mL Oral Q24H Emokpae, Courage, MD      . folic  acid (FOLVITE) tablet 1 mg  1 mg Oral Daily Emokpae, Courage, MD      . furosemide (LASIX) injection 40 mg  40 mg Intravenous BID Denton Brick, Courage, MD   40 mg at 06/22/20 1748  . labetalol (NORMODYNE) injection 10 mg  10 mg Intravenous Q4H PRN Emokpae, Courage, MD      . levothyroxine (SYNTHROID) tablet 25 mcg  25 mcg Oral [id number] Roxan Hockey, MD   25 mcg at 06/23/20 0519  . memantine (NAMENDA) tablet 10 mg  10 mg Oral BID Roxan Hockey, MD   10 mg at 06/22/20 1916  . metoprolol tartrate (LOPRESSOR) tablet 25 mg  25 mg Oral BID Roxan Hockey, MD   25 mg at 06/22/20 1916  . mometasone-formoterol (DULERA) 200-5 MCG/ACT inhaler 2 puff  2 puff Inhalation BID Roxan Hockey, MD   2 puff at 06/22/20 1921   And  . umeclidinium bromide (INCRUSE ELLIPTA) 62.5 MCG/INH 1 puff  1 puff Inhalation Daily Emokpae, Courage, MD      . ondansetron (ZOFRAN) tablet 4 mg  4 mg Oral Q6H PRN Emokpae, Courage, MD       Or  . ondansetron (ZOFRAN) injection 4 mg  4 mg Intravenous Q6H PRN Emokpae, Courage, MD      . pantoprazole (PROTONIX) injection 40 mg  40 mg Intravenous Q12H Emokpae, Courage, MD      . polyethylene glycol (MIRALAX / GLYCOLAX) packet 17 g  17 g Oral Daily PRN Emokpae, Courage, MD      . Derrill Memo ON 06/24/2020] potassium chloride (KLOR-CON) packet 20 mEq  20 mEq Oral Daily Danford, Christopher P, MD      . potassium chloride (KLOR-CON) packet 40 mEq  40 mEq Oral Once Danford, Suann Larry, MD      . senna-docusate (Senokot-S) tablet 2 tablet  2 tablet Oral QHS Roxan Hockey, MD    2 tablet at 06/22/20 1920  . sodium chloride flush (NS) 0.9 % injection 3 mL  3 mL Intravenous Q12H Emokpae, Courage, MD   3 mL at 06/22/20 2152  . sodium chloride flush (NS) 0.9 % injection 3 mL  3 mL Intravenous Q12H Emokpae, Courage, MD   3 mL at 06/22/20 2152  . sodium chloride flush (NS) 0.9 % injection 3 mL  3 mL Intravenous PRN Emokpae, Courage, MD      . traZODone (DESYREL) tablet 50 mg  50 mg Oral QHS PRN Roxan Hockey, MD        Allergies as of 06/22/2020 - Review Complete 06/22/2020  Allergen Reaction Noted  . Hctz [hydrochlorothiazide] Other (See Comments)   . Aspirin Other (See Comments) 08/23/2019  . Codeine Other (See Comments) 01/11/2016    Family History  Problem Relation Age of Onset  . Hypertension Mother   . Pneumonia Father   . Stomach cancer Brother   . Stroke Brother   . AAA (abdominal aortic aneurysm) Brother   . Cervical cancer Daughter   . Heart attack Neg Hx   . Colon cancer Neg Hx   . Esophageal cancer Neg Hx     Social History   Socioeconomic History  . Marital status: Widowed    Spouse name: Not on file  . Number of children: 3  . Years of education: Not on file  . Highest education level: 12th grade  Occupational History  . Occupation: Retired  Tobacco Use  . Smoking status: Never Smoker  . Smokeless tobacco: Never Used  Vaping Use  . Vaping Use: Never  used  Substance and Sexual Activity  . Alcohol use: No    Alcohol/week: 0.0 standard drinks  . Drug use: No  . Sexual activity: Not Currently  Other Topics Concern  . Not on file  Social History Narrative  . Not on file   Social Determinants of Health   Financial Resource Strain: Low Risk   . Difficulty of Paying Living Expenses: Not hard at all  Food Insecurity: No Food Insecurity  . Worried About Charity fundraiser in the Last Year: Never true  . Ran Out of Food in the Last Year: Never true  Transportation Needs: Not on file  Physical Activity: Inactive  . Days of  Exercise per Week: 0 days  . Minutes of Exercise per Session: 0 min  Stress: No Stress Concern Present  . Feeling of Stress : Not at all  Social Connections: Moderately Integrated  . Frequency of Communication with Friends and Family: More than three times a week  . Frequency of Social Gatherings with Friends and Family: More than three times a week  . Attends Religious Services: More than 4 times per year  . Active Member of Clubs or Organizations: Yes  . Attends Archivist Meetings: More than 4 times per year  . Marital Status: Widowed  Intimate Partner Violence: Not At Risk  . Fear of Current or Ex-Partner: No  . Emotionally Abused: No  . Physically Abused: No  . Sexually Abused: No    Review of Systems: General: Negative for anorexia, weight loss, fever, chills. ENT: Negative for hoarseness, difficulty swallowing. CV: Negative for chest pain, angina, palpitations, peripheral edema.  Respiratory: Admits chronic dyspnea at baseline, denies cough, sputum, wheezing.  GI: See history of present illness. MS: Admits arthritis and some difficulty with joints this morning.   Endo: Negative for unusual weight change.  Heme: Negative for bruising or bleeding. Allergy: Negative for rash or hives.  Physical Exam: Vital signs in last 24 hours: Temp:  [97.5 F (36.4 C)-98.4 F (36.9 C)] 97.6 F (36.4 C) (12/30 0639) Pulse Rate:  [34-72] 72 (12/30 0639) Resp:  [16-27] 20 (12/30 0639) BP: (126-179)/(55-139) 150/80 (12/30 0639) SpO2:  [90 %-100 %] 95 % (12/30 0639) Weight:  [52.6 kg] 52.6 kg (12/29 1111)   General:   Alert,  Well-developed, well-nourished, pleasant and cooperative in NAD Head:  Normocephalic and atraumatic. Eyes:  Sclera clear, no icterus. Conjunctiva pink. Ears:  Normal auditory acuity. Neck:  Supple; no masses or thyromegaly. Lungs:  Clear throughout to auscultation. No wheezes, crackles, or rhonchi. No acute distress. Heart:  Regular rate and rhythm; no  murmurs, clicks, rubs, or gallops. Abdomen:  Soft, nontender and nondistended. No masses, hepatosplenomegaly or hernias noted. Normal bowel sounds, without guarding, and without rebound.   Rectal:  Deferred.   Pulses:  Normal bilateral DP pulses noted. Extremities:  Without clubbing or edema. Neurologic:  Alert and mildly pleasantly confused this morning. Skin:  Intact without significant lesions or rashes. Psych:  Alert and cooperative. Normal mood and affect.  Intake/Output from previous day: 12/29 0701 - 12/30 0700 In: 630 [Blood:630] Out: 1450 [Urine:1450] Intake/Output this shift: No intake/output data recorded.  Lab Results: Recent Labs    06/21/20 1525 06/22/20 1113 06/23/20 0446  WBC 6.1 5.7 8.4  HGB 7.0* 7.4* 12.1  HCT 23.3* 24.5* 37.9  PLT 247 274 261   BMET Recent Labs    06/21/20 1525 06/22/20 1113 06/23/20 0446  NA 136 134* 134*  K 3.9  3.7 3.4*  CL 98 99 97*  CO2 25 26 26   GLUCOSE 120* 111* 117*  BUN 25 25* 21  CREATININE 1.26* 1.22* 1.09*  CALCIUM 8.1* 8.3* 8.8*   LFT Recent Labs    06/22/20 1113 06/23/20 0446  PROT 6.5  --   ALBUMIN 3.0* 3.3*  AST 23  --   ALT 13  --   ALKPHOS 122  --   BILITOT 0.5  --   BILIDIR 0.1  --   IBILI 0.4  --    PT/INR No results for input(s): LABPROT, INR in the last 72 hours. Hepatitis Panel No results for input(s): HEPBSAG, HCVAB, HEPAIGM, HEPBIGM in the last 72 hours. C-Diff No results for input(s): CDIFFTOX in the last 72 hours.  Studies/Results: No results found.  Impression: 86 year old female with a history of acute on chronic anemia, GI bleed previously on Eliquis for chronic anticoagulation although no longer chronically anticoagulated due to recent GI bleed earlier this year.  She does have a history of dementia making history difficult.  Majority of the historical information was retrieved by chart review.  It appears the patient did receive PRBC transfusion and interestingly this morning her  hemoglobin is now 12.1 (only appears to have received 2 units which makes this result confusing).  Anemia/GI Bleed: She does have a history of peptic ulcer disease with adherent clot treated with bipolar cautery.  Attempted flexible sigmoidoscopy this year was unsuccessful.  Baseline hemoglobin appears to be in the 7-9 range since March of this year, normal prior to then.  She presented due to episode of GI bleed (per the patient's daughter) and found to be heme positive with a hemoglobin of 7 that increased to 7.4 on recheck in the emergency department.  Some progressive weakness, episode of dyspnea, fatigue, other symptoms of symptomatic anemia.  Differentials include benign anorectal source, recurrent AVM of the small bowel with bleeding, bleeding polyp, less likely metastatic process or upper GI/gastric rapid transit bleed.  Overall she was previously deemed too high risk for colonoscopy.  Likely culprit of her acute on chronic anemia due to GI bleed includes recurrent peptic ulcer disease, lymphangiectasia's (as previously noted by primary GI), gastritis, duodenitis, esophagitis.  Less likely malignant process although it does remain in differentials.  Plan: 1. Monitor hgb closely 2. Monitor for recurrent, active GI bleed 3. Transfuse as necessary 4. Recheck H&H for confirmation of hgb 12.1 5. Will discuss with daughter goals and wishes regarding any possible procedure 6. Further recommendations to follow hgb check. 7. Supportive measures   Thank you for allowing [place] to participate in the care of [place], DNP, AGNP-C Adult & Gerontological Nurse Practitioner [facility]   LOS: 0 days     06/23/2020, 7:40 AM   Attending note: Discussed with Dr. Loleta Books.  Discharge is desired.  Hemoglobin  up to 12 after transfusion.  Clinically, stable.  As discussed, she is a very poor endoscopy candidate. Would be reasonable to allow her to be discharged  with  the understanding that she would need to return if she develops signs/symptoms of overt bleeding. She should continue once daily PPI and avoid antiplatelet/anticoagulation therapy moving forward. Follow-up with Dr. Hilarie Fredrickson as an outpatient.

## 2020-06-27 ENCOUNTER — Telehealth: Payer: Self-pay | Admitting: Cardiology

## 2020-06-27 DIAGNOSIS — G309 Alzheimer's disease, unspecified: Secondary | ICD-10-CM | POA: Diagnosis not present

## 2020-06-27 DIAGNOSIS — F0280 Dementia in other diseases classified elsewhere without behavioral disturbance: Secondary | ICD-10-CM | POA: Diagnosis not present

## 2020-06-27 DIAGNOSIS — J449 Chronic obstructive pulmonary disease, unspecified: Secondary | ICD-10-CM | POA: Diagnosis not present

## 2020-06-27 DIAGNOSIS — D649 Anemia, unspecified: Secondary | ICD-10-CM

## 2020-06-27 DIAGNOSIS — S72002D Fracture of unspecified part of neck of left femur, subsequent encounter for closed fracture with routine healing: Secondary | ICD-10-CM | POA: Diagnosis not present

## 2020-06-27 DIAGNOSIS — F0150 Vascular dementia without behavioral disturbance: Secondary | ICD-10-CM | POA: Diagnosis not present

## 2020-06-27 DIAGNOSIS — I69351 Hemiplegia and hemiparesis following cerebral infarction affecting right dominant side: Secondary | ICD-10-CM | POA: Diagnosis not present

## 2020-06-27 NOTE — Telephone Encounter (Signed)
   *  STAT* If patient is at the pharmacy, call can be transferred to refill team.   1. Which medications need to be refilled? (please list name of each medication and dose if known) iron polysaccharides (NIFEREX) 150 MG capsule  2. Which pharmacy/location (including street and city if local pharmacy) is medication to be sent to? [place], [place] [address]  3. Do they need a 30 day or 90 day supply? 30 days   Per pt's daughter, pharmacy said they did not receive prescription for this medication

## 2020-06-28 ENCOUNTER — Other Ambulatory Visit: Payer: Self-pay

## 2020-06-28 ENCOUNTER — Ambulatory Visit (INDEPENDENT_AMBULATORY_CARE_PROVIDER_SITE_OTHER): Payer: Medicare Other | Admitting: Family Medicine

## 2020-06-28 ENCOUNTER — Encounter: Payer: Self-pay | Admitting: Family Medicine

## 2020-06-28 VITALS — BP 113/64 | HR 64 | Temp 98.0°F | Ht 60.0 in

## 2020-06-28 DIAGNOSIS — G309 Alzheimer's disease, unspecified: Secondary | ICD-10-CM | POA: Diagnosis not present

## 2020-06-28 DIAGNOSIS — D62 Acute posthemorrhagic anemia: Secondary | ICD-10-CM

## 2020-06-28 DIAGNOSIS — Z9289 Personal history of other medical treatment: Secondary | ICD-10-CM

## 2020-06-28 DIAGNOSIS — Z8719 Personal history of other diseases of the digestive system: Secondary | ICD-10-CM

## 2020-06-28 DIAGNOSIS — F0280 Dementia in other diseases classified elsewhere without behavioral disturbance: Secondary | ICD-10-CM

## 2020-06-28 DIAGNOSIS — R2689 Other abnormalities of gait and mobility: Secondary | ICD-10-CM

## 2020-06-28 LAB — HEMOGLOBIN, FINGERSTICK: Hemoglobin: 11 g/dL — ABNORMAL LOW (ref 11.1–15.9)

## 2020-06-28 NOTE — Progress Notes (Signed)
Subjective:  Patient ID: Crystal Cooper, female    DOB: Sep 02, 1926  Age: 87 y.o. MRN: [id number]  CC: Follow-up (ER )   HPI Crystal Cooper presents for hospital follow-up due to recent stay for transfusion.  Her hemoglobin recently fell to 7.0 and she was hospital by her cardiologist for multiple transfusions.  Her hemoglobin went back up to 12 and she was discharged.  That was about a week ago.  Her daughter is here with him and gives history saying that she passed some blood in her stool a couple of days ago.  However other than that she has been normal.  She is easily confused.  She has been nonambulatory since her hip fracture 4 months ago.  She also had a stroke back in March of last year.  She cannot stand up except briefly with support from another person.  She has no balance at all.  She has been steady.  She  cannot  dress herself.  Depression screen [facility] 2/9 06/28/2020 02/09/2020 01/12/2020  Decreased Interest 0 0 0  Down, Depressed, Hopeless 0 0 0  PHQ - 2 Score 0 0 0  Altered sleeping - - -  Tired, decreased energy - - -  Change in appetite - - -  Feeling bad or failure about yourself  - - -  Trouble concentrating - - -  Moving slowly or fidgety/restless - - -  Suicidal thoughts - - -  PHQ-9 Score - - -  Difficult doing work/chores - - -  Some recent data might be hidden    History Crystal Cooper has a past medical history of AKI (acute kidney injury) ([place]), Anemia, Aortic stenosis, Arthritis, Asthma, Atrial fibrillation ([place]), Blood transfusion without reported diagnosis, CHF (congestive heart failure) ([place]) (11/2014), CKD (chronic kidney disease), Dementia ([place]), Family history of adverse reaction to anesthesia, Gastric AVM, GERD (gastroesophageal reflux disease), GI bleed, Glaucoma, Hearing loss, Heart murmur, HTN (hypertension), Hypokalemia, Myocardial infarction ([place]), Pneumonia, Presence of permanent cardiac pacemaker, Prolapsing mitral leaflet syndrome, Shortness of breath dyspnea,  Stroke ([facility]), and SVT (supraventricular tachycardia) ([place]).   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   Her family history includes AAA (abdominal aortic aneurysm) in her brother; Cervical cancer in her daughter; Hypertension in her mother; Pneumonia in her father; Stomach cancer in her brother; Stroke in her brother.She reports that she has never smoked. She has never used smokeless tobacco. She reports that she does not drink alcohol and does not use drugs.    ROS Review of Systems  Constitutional: Negative.   HENT: Negative.   Respiratory: Negative for shortness of breath.   Cardiovascular: Negative for chest pain.  Gastrointestinal: Positive for blood in stool. Negative for abdominal pain and diarrhea.  Musculoskeletal: Positive for gait problem. Negative for arthralgias.  Psychiatric/Behavioral: Positive for confusion.    Objective:  BP 113/64   Pulse 64   Temp 98 F (36.7 C) (Temporal)   Ht 5' (1.524 m)   BMI 22.65 kg/m   BP Readings from Last 3 Encounters:  06/28/20 113/64  06/23/20 [name])  150/80  06/21/20 (!) 94/44    Wt Readings from Last 3 Encounters:  06/22/20 116 lb (52.6 kg)  06/21/20 116 lb 8 oz (52.8 kg)  06/06/20 106 lb (48.1 kg)     Physical Exam Constitutional:       General: She is not in acute distress.    Appearance: She is well-developed.  HENT:     Head: Normocephalic and atraumatic.  Eyes:     Conjunctiva/sclera: Conjunctivae normal.     Pupils: Pupils are equal, round, and reactive to light.  Neck:     Thyroid: No thyromegaly.  Cardiovascular:     Rate and Rhythm: Normal rate and regular rhythm.     Heart sounds: Normal heart sounds. No murmur heard.   Pulmonary:     Effort: Pulmonary effort is normal. No respiratory distress.     Breath sounds: Normal breath sounds. No wheezing or rales.  Abdominal:     General: Bowel sounds are normal. There is no distension.     Palpations: Abdomen is soft.     Tenderness: There is no abdominal tenderness.  Musculoskeletal:        General: Normal range of motion.     Cervical back: Normal range of motion and neck supple.  Lymphadenopathy:     Cervical: No cervical adenopathy.  Skin:    General: Skin is warm and dry.  Neurological:     Mental Status: She is alert and oriented to person, place, and time.       Assessment & Plan:   Crystal Cooper was seen today for follow-up.  Diagnoses and all orders for this visit:  Acute blood loss anemia -     CBC with Differential/Platelet -     CMP14+EGFR  History of recent blood transfusion -     Hemoglobin, fingerstick -     CBC with Differential/Platelet -     CMP14+EGFR  History of GI bleed -     Hemoglobin, fingerstick -     CBC with Differential/Platelet -     CMP14+EGFR  Alzheimer's dementia without behavioral disturbance, unspecified timing of dementia onset (HCC)  Impairment of balance       I am having Crystal Cooper maintain her Biotin, metoprolol tartrate, albuterol, Caltrate 600+D3, Breztri Aerosphere, famotidine, memantine, potassium chloride, furosemide, Euthyrox, docusate sodium, pantoprazole, folic acid, and iron polysaccharides.  Allergies as of 06/28/2020      Reactions   Hctz [hydrochlorothiazide] Other (See  Comments)   Pt was ill and this affected her kidneys   Aspirin Other (See Comments)   Cardiologist said the patient is to not take this   Codeine Other (See Comments)   Made the patient feel ill, has not had any problems since 1977      Medication List       Accurate as of June 28, 2020  5:07 PM. If you have any questions, ask your nurse or doctor.        albuterol 108 (90 Base) MCG/ACT inhaler Commonly known as: ProAir HFA Inhale 2 puffs into the lungs every 4 (four) hours as needed for wheezing or shortness of breath.   Biotin 10 MG Caps Take 10 mg by mouth daily.   Breztri Aerosphere 160-9-4.8 MCG/ACT Aero Generic drug: Budeson-Glycopyrrol-Formoterol Inhale 2 Inhalers into the lungs in the morning and at bedtime.   Caltrate 600+D3 600-800 MG-UNIT Tabs Generic drug: Calcium Carb-Cholecalciferol Take 1 tablet by mouth daily.   docusate sodium 100 MG capsule Commonly known as: COLACE  Take 1 capsule (100 mg total) by mouth 2 (two) times daily.   Euthyrox 25 MCG tablet Generic drug: levothyroxine Take 1 tablet by mouth once daily   famotidine 20 MG tablet Commonly known as: Pepcid Take 1 tablet (20 mg total) by mouth at bedtime.   folic acid 1 MG tablet Commonly known as: FOLVITE Take 1 tablet (1 mg total) by mouth daily.   furosemide 20 MG tablet Commonly known as: LASIX Take 4 tablets (80 mg total) by mouth daily.   iron polysaccharides 150 MG capsule Commonly known as: NIFEREX Take 1 capsule (150 mg total) by mouth daily.   memantine 10 MG tablet Commonly known as: NAMENDA Take 1 tablet (10 mg total) by mouth 2 (two) times daily.   metoprolol tartrate 25 MG tablet Commonly known as: LOPRESSOR Take 1 tablet (25 mg total) by mouth 2 (two) times daily.   pantoprazole 40 MG tablet Commonly known as: PROTONIX Take 1 tablet (40 mg total) by mouth 2 (two) times daily.   potassium chloride 10 MEQ tablet Commonly known as: KLOR-CON Take 2 tablets (20 mEq  total) by mouth 2 (two) times daily. What changed: how much to take        Follow-up: No follow-ups on file.  [name], M.D.

## 2020-06-28 NOTE — Telephone Encounter (Signed)
*  STAT* If patient is at the pharmacy, call can be transferred to refill team.   1. Which medications need to be refilled? (please list name of each medication and dose if known) Ferrex  2. Which pharmacy/location (including street and city if local pharmacy) is medication to be sent to? [facility] [place]  3. Do they need a 30 day or 90 day supply?  90 days

## 2020-06-29 ENCOUNTER — Telehealth: Payer: Self-pay

## 2020-06-29 DIAGNOSIS — F0280 Dementia in other diseases classified elsewhere without behavioral disturbance: Secondary | ICD-10-CM | POA: Diagnosis not present

## 2020-06-29 DIAGNOSIS — G309 Alzheimer's disease, unspecified: Secondary | ICD-10-CM | POA: Diagnosis not present

## 2020-06-29 DIAGNOSIS — F0150 Vascular dementia without behavioral disturbance: Secondary | ICD-10-CM | POA: Diagnosis not present

## 2020-06-29 DIAGNOSIS — S72002D Fracture of unspecified part of neck of left femur, subsequent encounter for closed fracture with routine healing: Secondary | ICD-10-CM | POA: Diagnosis not present

## 2020-06-29 DIAGNOSIS — I69351 Hemiplegia and hemiparesis following cerebral infarction affecting right dominant side: Secondary | ICD-10-CM | POA: Diagnosis not present

## 2020-06-29 DIAGNOSIS — J449 Chronic obstructive pulmonary disease, unspecified: Secondary | ICD-10-CM | POA: Diagnosis not present

## 2020-06-29 LAB — CMP14+EGFR
ALT: 11 IU/L (ref 0–32)
AST: 21 IU/L (ref 0–40)
Albumin/Globulin Ratio: 1.1 — ABNORMAL LOW (ref 1.2–2.2)
Albumin: 3.5 g/dL (ref 3.5–4.6)
Alkaline Phosphatase: 168 IU/L — ABNORMAL HIGH (ref 44–121)
BUN/Creatinine Ratio: 18 (ref 12–28)
BUN: 25 mg/dL (ref 10–36)
Bilirubin Total: 0.5 mg/dL (ref 0.0–1.2)
CO2: 27 mmol/L (ref 20–29)
Calcium: 8.7 mg/dL (ref 8.7–10.3)
Chloride: 95 mmol/L — ABNORMAL LOW (ref 96–106)
Creatinine, Ser: 1.39 mg/dL — ABNORMAL HIGH (ref 0.57–1.00)
GFR calc Af Amer: 38 mL/min/{1.73_m2} — ABNORMAL LOW (ref >59)
GFR calc non Af Amer: 33 mL/min/{1.73_m2} — ABNORMAL LOW (ref >59)
Globulin, Total: 3.2 g/dL (ref 1.5–4.5)
Glucose: 104 mg/dL — ABNORMAL HIGH (ref 65–99)
Potassium: 3.9 mmol/L (ref 3.5–5.2)
Sodium: 137 mmol/L (ref 134–144)
Total Protein: 6.7 g/dL (ref 6.0–8.5)

## 2020-06-29 LAB — CBC WITH DIFFERENTIAL/PLATELET
Basophils Absolute: 0.1 10*3/uL (ref 0.0–0.2)
Basos: 1 %
EOS (ABSOLUTE): 0.1 10*3/uL (ref 0.0–0.4)
Eos: 2 %
Hematocrit: 36.9 % (ref 34.0–46.6)
Hemoglobin: 12 g/dL (ref 11.1–15.9)
Immature Grans (Abs): 0 10*3/uL (ref 0.0–0.1)
Immature Granulocytes: 0 %
Lymphocytes Absolute: 0.9 10*3/uL (ref 0.7–3.1)
Lymphs: 15 %
MCH: 27.1 pg (ref 26.6–33.0)
MCHC: 32.5 g/dL (ref 31.5–35.7)
MCV: 83 fL (ref 79–97)
Monocytes Absolute: 0.6 10*3/uL (ref 0.1–0.9)
Monocytes: 11 %
Neutrophils Absolute: 4.2 10*3/uL (ref 1.4–7.0)
Neutrophils: 71 %
Platelets: 266 10*3/uL (ref 150–450)
RBC: 4.43 x10E6/uL (ref 3.77–5.28)
RDW: 14.7 % (ref 11.7–15.4)
WBC: 6 10*3/uL (ref 3.4–10.8)

## 2020-06-29 MED ORDER — POLYSACCHARIDE IRON COMPLEX 150 MG PO CAPS: 150.0000 mg | ORAL_CAPSULE | Freq: Every day | ORAL | 3 refills | Status: DC

## 2020-06-29 NOTE — Telephone Encounter (Signed)
Aware and verbalizes understanding.  

## 2020-06-29 NOTE — Telephone Encounter (Signed)
    Pt's daughter calling back to f/u, per [facility] they haven't receive the prescription for iron polysaccharides (NIFEREX) 150 MG capsule and pt is running out of meds

## 2020-06-29 NOTE — Telephone Encounter (Signed)
There is an orange form we use. Not sure where to find it. Charna Busman or Abigail Butts might know.

## 2020-06-29 NOTE — Progress Notes (Signed)
Hello Crystal Cooper,  Your lab result is normal and/or stable.Some minor variations that are not significant are commonly marked abnormal, but do not represent any medical problem for you.  Best regards, Naveen Lorusso, M.D.

## 2020-06-29 NOTE — Telephone Encounter (Signed)
Patient's daughter calling to ask about medicine that patient could take for pain (over the counter) that would not hurt her stomach since she has bleeding issues.  She does not experience a lot of pain but would like to know what she could take for headaches, etc when needed.  Saw Dr. Livia Snellen 06/28/20 and forgot to ask during the visit.  Thank you!

## 2020-06-29 NOTE — Telephone Encounter (Signed)
Tylenol / acetaminophen is the only one safe for her

## 2020-06-30 DIAGNOSIS — G309 Alzheimer's disease, unspecified: Secondary | ICD-10-CM | POA: Diagnosis not present

## 2020-06-30 DIAGNOSIS — I69351 Hemiplegia and hemiparesis following cerebral infarction affecting right dominant side: Secondary | ICD-10-CM | POA: Diagnosis not present

## 2020-06-30 DIAGNOSIS — S72002D Fracture of unspecified part of neck of left femur, subsequent encounter for closed fracture with routine healing: Secondary | ICD-10-CM | POA: Diagnosis not present

## 2020-06-30 DIAGNOSIS — F0280 Dementia in other diseases classified elsewhere without behavioral disturbance: Secondary | ICD-10-CM | POA: Diagnosis not present

## 2020-06-30 DIAGNOSIS — J449 Chronic obstructive pulmonary disease, unspecified: Secondary | ICD-10-CM | POA: Diagnosis not present

## 2020-06-30 DIAGNOSIS — F0150 Vascular dementia without behavioral disturbance: Secondary | ICD-10-CM | POA: Diagnosis not present

## 2020-06-30 NOTE — Telephone Encounter (Signed)
Form placed on providers desk 

## 2020-07-01 DIAGNOSIS — I471 Supraventricular tachycardia: Secondary | ICD-10-CM | POA: Diagnosis not present

## 2020-07-01 DIAGNOSIS — M47812 Spondylosis without myelopathy or radiculopathy, cervical region: Secondary | ICD-10-CM | POA: Diagnosis not present

## 2020-07-01 DIAGNOSIS — Q2733 Arteriovenous malformation of digestive system vessel: Secondary | ICD-10-CM | POA: Diagnosis not present

## 2020-07-01 DIAGNOSIS — D631 Anemia in chronic kidney disease: Secondary | ICD-10-CM | POA: Diagnosis not present

## 2020-07-01 DIAGNOSIS — M170 Bilateral primary osteoarthritis of knee: Secondary | ICD-10-CM | POA: Diagnosis not present

## 2020-07-01 DIAGNOSIS — E039 Hypothyroidism, unspecified: Secondary | ICD-10-CM | POA: Diagnosis not present

## 2020-07-01 DIAGNOSIS — H409 Unspecified glaucoma: Secondary | ICD-10-CM | POA: Diagnosis not present

## 2020-07-01 DIAGNOSIS — S72002D Fracture of unspecified part of neck of left femur, subsequent encounter for closed fracture with routine healing: Secondary | ICD-10-CM | POA: Diagnosis not present

## 2020-07-01 DIAGNOSIS — J449 Chronic obstructive pulmonary disease, unspecified: Secondary | ICD-10-CM | POA: Diagnosis not present

## 2020-07-01 DIAGNOSIS — I5032 Chronic diastolic (congestive) heart failure: Secondary | ICD-10-CM | POA: Diagnosis not present

## 2020-07-01 DIAGNOSIS — K219 Gastro-esophageal reflux disease without esophagitis: Secondary | ICD-10-CM | POA: Diagnosis not present

## 2020-07-01 DIAGNOSIS — I130 Hypertensive heart and chronic kidney disease with heart failure and stage 1 through stage 4 chronic kidney disease, or unspecified chronic kidney disease: Secondary | ICD-10-CM | POA: Diagnosis not present

## 2020-07-01 DIAGNOSIS — I480 Paroxysmal atrial fibrillation: Secondary | ICD-10-CM | POA: Diagnosis not present

## 2020-07-01 DIAGNOSIS — F0280 Dementia in other diseases classified elsewhere without behavioral disturbance: Secondary | ICD-10-CM | POA: Diagnosis not present

## 2020-07-01 DIAGNOSIS — N184 Chronic kidney disease, stage 4 (severe): Secondary | ICD-10-CM | POA: Diagnosis not present

## 2020-07-01 DIAGNOSIS — I69351 Hemiplegia and hemiparesis following cerebral infarction affecting right dominant side: Secondary | ICD-10-CM | POA: Diagnosis not present

## 2020-07-01 DIAGNOSIS — M4802 Spinal stenosis, cervical region: Secondary | ICD-10-CM | POA: Diagnosis not present

## 2020-07-01 DIAGNOSIS — M791 Myalgia, unspecified site: Secondary | ICD-10-CM | POA: Diagnosis not present

## 2020-07-01 DIAGNOSIS — D62 Acute posthemorrhagic anemia: Secondary | ICD-10-CM | POA: Diagnosis not present

## 2020-07-01 DIAGNOSIS — G309 Alzheimer's disease, unspecified: Secondary | ICD-10-CM | POA: Diagnosis not present

## 2020-07-01 DIAGNOSIS — F0150 Vascular dementia without behavioral disturbance: Secondary | ICD-10-CM | POA: Diagnosis not present

## 2020-07-01 DIAGNOSIS — I495 Sick sinus syndrome: Secondary | ICD-10-CM | POA: Diagnosis not present

## 2020-07-01 DIAGNOSIS — I0981 Rheumatic heart failure: Secondary | ICD-10-CM | POA: Diagnosis not present

## 2020-07-01 DIAGNOSIS — I059 Rheumatic mitral valve disease, unspecified: Secondary | ICD-10-CM | POA: Diagnosis not present

## 2020-07-01 DIAGNOSIS — K259 Gastric ulcer, unspecified as acute or chronic, without hemorrhage or perforation: Secondary | ICD-10-CM | POA: Diagnosis not present

## 2020-07-04 ENCOUNTER — Ambulatory Visit: Payer: Self-pay | Admitting: Licensed Clinical Social Worker

## 2020-07-04 ENCOUNTER — Telehealth: Payer: Self-pay

## 2020-07-04 DIAGNOSIS — M810 Age-related osteoporosis without current pathological fracture: Secondary | ICD-10-CM

## 2020-07-04 DIAGNOSIS — N1832 Chronic kidney disease, stage 3b: Secondary | ICD-10-CM

## 2020-07-04 DIAGNOSIS — F0150 Vascular dementia without behavioral disturbance: Secondary | ICD-10-CM

## 2020-07-04 DIAGNOSIS — I639 Cerebral infarction, unspecified: Secondary | ICD-10-CM

## 2020-07-04 DIAGNOSIS — I2510 Atherosclerotic heart disease of native coronary artery without angina pectoris: Secondary | ICD-10-CM

## 2020-07-04 DIAGNOSIS — H9041 Sensorineural hearing loss, unilateral, right ear, with unrestricted hearing on the contralateral side: Secondary | ICD-10-CM

## 2020-07-04 DIAGNOSIS — J4520 Mild intermittent asthma, uncomplicated: Secondary | ICD-10-CM

## 2020-07-04 DIAGNOSIS — I10 Essential (primary) hypertension: Secondary | ICD-10-CM

## 2020-07-04 NOTE — Patient Instructions (Addendum)
Licensed Clinical Social Worker Visit Information  07/04/2020  Name: Crystal Cooper      MRN: [id number]       DOB: 09-03-1926  Crystal Cooper is a 87 y.o. year old female who is a primary care patient of Stacks, [name], MD. The CCM team was consulted for assistance with [employer] .   Review of patient status, including review of consultants reports, other relevant assessments, and collaboration with appropriate care team members and the patient's provider was performed as part of comprehensive patient evaluation and provision of chronic care management services.    SDOH (Social Determinants of Health) assessments performed: No; risk for physical inactivity; risk for stress; risk for depression; risk for financial strain  Materials Provided: No  LCSW received call today from Synetta Fail, daughter of client. Quita Skye and LCSW spoke of process of referral for client from PCP office to a skilled nursing facility.  Quita Skye said they were just gathering information on this process and were not sure of a time line at present but wanted to know about process if they wanted to pursue this option for client care in the future. Quita Skye was appreciative of information provided to her by LCSW today  Follow Up Plan: LCSW to call client or Quita Skye, daughter, in next 4 weeks to discuss social work needs of client at that time  The patient/ Synetta Fail, daughter of patient,  verbalized understanding of instructions provided today and declined a print copy of patient instruction materials.   [name].Rosilyn Coachman MSW, LCSW Licensed Clinical Social Worker [facility] [phone]

## 2020-07-04 NOTE — Chronic Care Management (AMB) (Signed)
Chronic Care Management    Clinical Social Work Follow Up Note  07/04/2020 Name: Crystal Cooper MRN: [id number] DOB: 20-Sep-1926  Crystal Cooper is a 87 y.o. year old female who is a primary care patient of Stacks, [name], MD. The CCM team was consulted for assistance with [employer] .   Review of patient status, including review of consultants reports, other relevant assessments, and collaboration with appropriate care team members and the patient's provider was performed as part of comprehensive patient evaluation and provision of chronic care management services.    SDOH (Social Determinants of Health) assessments performed: No; risk for physical inactivity; risk for stress; risk for depression; risk for financial strain  Flowsheet Row Chronic Care Management from 09/11/2019 in [place]  PHQ-9 Total Score 5     GAD 7 : Generalized Anxiety Score 09/11/2019  Nervous, Anxious, on Edge 1  Control/stop worrying 1  Worry too much - different things 1  Trouble relaxing 0  Restless 0  Easily annoyed or irritable 0  Afraid - awful might happen 0  Total GAD 7 Score 3  Anxiety Difficulty Somewhat difficult    Outpatient Encounter Medications as of 07/04/2020  Medication Sig Note  . albuterol (PROAIR HFA) 108 (90 Base) MCG/ACT inhaler Inhale 2 puffs into the lungs every 4 (four) hours as needed for wheezing or shortness of breath.   . Biotin 10 MG CAPS Take 10 mg by mouth daily.    . Budeson-Glycopyrrol-Formoterol (BREZTRI AEROSPHERE) 160-9-4.8 MCG/ACT AERO Inhale 2 Inhalers into the lungs in the morning and at bedtime.   . Calcium Carb-Cholecalciferol (CALTRATE 600+D3) 600-800 MG-UNIT TABS Take 1 tablet by mouth daily.   [name] docusate sodium (COLACE) 100 MG capsule Take 1 capsule (100 mg total) by mouth 2 (two) times daily.   [name] 25 MCG tablet Take 1 tablet by mouth once daily   . famotidine (PEPCID) 20 MG tablet Take 1 tablet (20 mg total) by mouth at  bedtime.   . folic acid (FOLVITE) 1 MG tablet Take 1 tablet (1 mg total) by mouth daily.   . furosemide (LASIX) 20 MG tablet Take 4 tablets (80 mg total) by mouth daily.   . iron polysaccharides (NIFEREX) 150 MG capsule Take 1 capsule (150 mg total) by mouth daily.   . memantine (NAMENDA) 10 MG tablet Take 1 tablet (10 mg total) by mouth 2 (two) times daily.   . metoprolol tartrate (LOPRESSOR) 25 MG tablet Take 1 tablet (25 mg total) by mouth 2 (two) times daily.   . pantoprazole (PROTONIX) 40 MG tablet Take 1 tablet (40 mg total) by mouth 2 (two) times daily.   . potassium chloride SA (KLOR-CON) 10 MEQ tablet Take 2 tablets (20 mEq total) by mouth 2 (two) times daily. (Patient taking differently: Take 10 mEq by mouth 2 (two) times daily.) 03/11/2020: Prescribed 24meq BID, Pt's daughter says she gives it 33meq daily   No facility-administered encounter medications on file as of 07/04/2020.   LCSW received call today from Synetta Fail, daughter of client. Crystal Cooper and LCSW spoke of process of referral for client from PCP office to a skilled nursing facility.  Crystal Cooper said they were just gathering information on this process and were not sure of a time line at present but wanted to know about process if they wanted to pursue this option for client care in the future. Crystal Cooper was appreciative of information provided to her by LCSW today  Follow Up Plan: LCSW  to call client or Crystal Cooper, daughter, in next 4 weeks to discuss social work needs of client at that time  [name].Marenda Accardi MSW, LCSW Licensed Clinical Social Worker [facility] [phone]

## 2020-07-04 NOTE — Telephone Encounter (Signed)
Left message to call back  

## 2020-07-05 ENCOUNTER — Emergency Department (HOSPITAL_COMMUNITY)
Admission: EM | Admit: 2020-07-05 | Discharge: 2020-07-05 | Disposition: A | Discharge status: Home / Self Care | Payer: Medicare Other | Attending: Emergency Medicine | Admitting: Emergency Medicine

## 2020-07-05 ENCOUNTER — Encounter (HOSPITAL_COMMUNITY): Payer: Self-pay | Admitting: Emergency Medicine

## 2020-07-05 ENCOUNTER — Other Ambulatory Visit: Payer: Self-pay

## 2020-07-05 DIAGNOSIS — Z954 Presence of other heart-valve replacement: Secondary | ICD-10-CM | POA: Insufficient documentation

## 2020-07-05 DIAGNOSIS — Z79899 Other long term (current) drug therapy: Secondary | ICD-10-CM | POA: Diagnosis not present

## 2020-07-05 DIAGNOSIS — I130 Hypertensive heart and chronic kidney disease with heart failure and stage 1 through stage 4 chronic kidney disease, or unspecified chronic kidney disease: Secondary | ICD-10-CM | POA: Diagnosis not present

## 2020-07-05 DIAGNOSIS — Z950 Presence of cardiac pacemaker: Secondary | ICD-10-CM | POA: Diagnosis not present

## 2020-07-05 DIAGNOSIS — J449 Chronic obstructive pulmonary disease, unspecified: Secondary | ICD-10-CM | POA: Diagnosis not present

## 2020-07-05 DIAGNOSIS — K625 Hemorrhage of anus and rectum: Secondary | ICD-10-CM | POA: Diagnosis present

## 2020-07-05 DIAGNOSIS — N1832 Chronic kidney disease, stage 3b: Secondary | ICD-10-CM | POA: Insufficient documentation

## 2020-07-05 DIAGNOSIS — I509 Heart failure, unspecified: Secondary | ICD-10-CM | POA: Insufficient documentation

## 2020-07-05 DIAGNOSIS — F0390 Unspecified dementia without behavioral disturbance: Secondary | ICD-10-CM | POA: Insufficient documentation

## 2020-07-05 DIAGNOSIS — K922 Gastrointestinal hemorrhage, unspecified: Secondary | ICD-10-CM | POA: Insufficient documentation

## 2020-07-05 DIAGNOSIS — I10 Essential (primary) hypertension: Secondary | ICD-10-CM | POA: Diagnosis not present

## 2020-07-05 LAB — COMPREHENSIVE METABOLIC PANEL
ALT: 13 U/L (ref 0–44)
AST: 22 U/L (ref 15–41)
Albumin: 2.8 g/dL — ABNORMAL LOW (ref 3.5–5.0)
Alkaline Phosphatase: 111 U/L (ref 38–126)
Anion gap: 9 (ref 5–15)
BUN: 28 mg/dL — ABNORMAL HIGH (ref 8–23)
CO2: 28 mmol/L (ref 22–32)
Calcium: 8.7 mg/dL — ABNORMAL LOW (ref 8.9–10.3)
Chloride: 99 mmol/L (ref 98–111)
Creatinine, Ser: 1.39 mg/dL — ABNORMAL HIGH (ref 0.44–1.00)
GFR, Estimated: 36 mL/min — ABNORMAL LOW (ref >60)
Glucose, Bld: 102 mg/dL — ABNORMAL HIGH (ref 70–99)
Potassium: 4 mmol/L (ref 3.5–5.1)
Sodium: 136 mmol/L (ref 135–145)
Total Bilirubin: 0.6 mg/dL (ref 0.3–1.2)
Total Protein: 6.2 g/dL — ABNORMAL LOW (ref 6.5–8.1)

## 2020-07-05 LAB — CBC WITH DIFFERENTIAL/PLATELET
Abs Immature Granulocytes: 0.03 10*3/uL (ref 0.00–0.07)
Basophils Absolute: 0.1 10*3/uL (ref 0.0–0.1)
Basophils Relative: 1 %
Eosinophils Absolute: 0.1 10*3/uL (ref 0.0–0.5)
Eosinophils Relative: 1 %
HCT: 33 % — ABNORMAL LOW (ref 36.0–46.0)
Hemoglobin: 9.8 g/dL — ABNORMAL LOW (ref 12.0–15.0)
Immature Granulocytes: 1 %
Lymphocytes Relative: 14 %
Lymphs Abs: 0.9 10*3/uL (ref 0.7–4.0)
MCH: 26.6 pg (ref 26.0–34.0)
MCHC: 29.7 g/dL — ABNORMAL LOW (ref 30.0–36.0)
MCV: 89.4 fL (ref 80.0–100.0)
Monocytes Absolute: 0.8 10*3/uL (ref 0.1–1.0)
Monocytes Relative: 12 %
Neutro Abs: 4.7 10*3/uL (ref 1.7–7.7)
Neutrophils Relative %: 71 %
Platelets: 266 10*3/uL (ref 150–400)
RBC: 3.69 MIL/uL — ABNORMAL LOW (ref 3.87–5.11)
RDW: 16.5 % — ABNORMAL HIGH (ref 11.5–15.5)
WBC: 6.5 10*3/uL (ref 4.0–10.5)
nRBC: 0 % (ref 0.0–0.2)

## 2020-07-05 LAB — POC OCCULT BLOOD, ED
Fecal Occult Bld: NEGATIVE
Fecal Occult Bld: POSITIVE — AB

## 2020-07-05 MED ORDER — SODIUM CHLORIDE 0.9 % IV BOLUS
250.0000 mL | Freq: Once | INTRAVENOUS | Status: AC
  Administered 2020-07-05: 250 mL via INTRAVENOUS

## 2020-07-05 MED ORDER — PANTOPRAZOLE SODIUM 40 MG IV SOLR
40.0000 mg | Freq: Once | INTRAVENOUS | Status: AC
  Administered 2020-07-05: 40 mg via INTRAVENOUS
  Filled 2020-07-05: qty 40

## 2020-07-05 NOTE — Discharge Instructions (Addendum)
Make sure you seeing your primary care doctor either Thursday or Friday to get a repeat hemoglobin.  Return if significant bleeding occurs.  Call your GI doctor to see if they may be able to see you next week

## 2020-07-05 NOTE — ED Provider Notes (Signed)
[facility] Provider Note   CSN: [id number] Arrival date & time: 07/05/20  7262     History Chief Complaint  Patient presents with  . GI Bleeding    Crystal Cooper is a 87 y.o. female.  Patient had some rectal bleeding today according to daughter. Patient has been worked up in the hospital for this and was recently transfused.  The history is provided by the patient and medical records. No language interpreter was used.  Rectal Bleeding Quality:  Bright red Amount:  Scant Timing:  Intermittent Chronicity:  Recurrent Context: not anal fissures   Similar prior episodes: no   Relieved by:  Nothing Worsened by:  Nothing Associated symptoms: no abdominal pain        Past Medical History:  Diagnosis Date  . AKI (acute kidney injury) ([place])   . Anemia    years ago  . Aortic stenosis   . Arthritis   . Asthma   . Atrial fibrillation ([place])   . Blood transfusion without reported diagnosis   . CHF (congestive heart failure) ([place]) 11/2014  . CKD (chronic kidney disease)    had many uti this year   . Dementia ([place])   . Family history of adverse reaction to anesthesia    2 daughters would have N/V  . Gastric AVM   . GERD (gastroesophageal reflux disease)    was seen by Gi for bleeding ulcer which was cauterized   . GI bleed   . Glaucoma   . Hearing loss   . Heart murmur    Rheumatic fever whe she was young,  sees Dr. Stanford Breed in [place]  . HTN (hypertension)   . Hypokalemia   . Myocardial infarction ([place])    in her 50 when she had two heart attacks   . Pneumonia    stayed from friday to sunday in august  . Presence of permanent cardiac pacemaker    placed about 4 years ago  . Prolapsing mitral leaflet syndrome   . Shortness of breath dyspnea    with exertion  . Stroke ([place])   . SVT (supraventricular tachycardia) (HCC)    S/P ablation of AVNRT in 2003    Patient Active Problem List   Diagnosis Date Noted  . Impairment of balance  06/28/2020  . Acute GI bleeding 06/22/2020  . Status post hip surgery   . Closed left hip fracture, initial encounter ([place]) 03/09/2020  . Upper GI bleed 02/19/2020  . Acute blood loss anemia 02/19/2020  . CKD (chronic kidney disease) stage 3, GFR 30-59 ml/min (HCC) 02/19/2020  . Hematochezia 02/19/2020  . Iron deficiency anemia due to chronic blood loss 02/18/2020  . AVM (arteriovenous malformation) 02/18/2020  . COPD (chronic obstructive pulmonary disease) ([place]) 02/18/2020  . Dementia without behavioral disturbance ([place]) 02/18/2020  . SOB (shortness of breath)   . COPD with acute exacerbation ([place])   . CAP (community acquired pneumonia) 02/12/2020  . Bilateral impacted cerumen 11/27/2019  . Long term (current) use of anticoagulants   . Acute renal failure superimposed on stage 3b chronic kidney disease ([place])   . Wheezing 09/23/2019  . Abnormal breath sounds 09/09/2019  . Heme positive stool   . Acute CVA (cerebrovascular accident) ([place]) 08/27/2019  . PNA (pneumonia) 08/25/2019  . Hypokalemia 08/23/2019  . Angio-edema, initial encounter 08/23/2019  . Reactive airway disease 03/24/2019  . Gastroesophageal reflux disease 03/24/2019  . CKD (chronic kidney disease), stage III ([place]) 11/12/2018  . GI bleed 05/12/2018  .  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 Diastolic dysfunction 74/94/4967  . Pacemaker 05/12/2018  . Iron deficiency anemia   . Melena   . Duodenal arteriovenous malformation   . Gastritis and gastroduodenitis   . Normocytic anemia 04/06/2018  . Osteoporosis 08/27/2016  . ASCVD (arteriosclerotic cardiovascular disease) 01/11/2016  . Asthma 01/11/2016  . Glaucoma 01/11/2016  . Hearing loss 01/11/2016  . Other nonrheumatic mitral valve disorders 01/11/2016  . Junctional bradycardia   . PAF (paroxysmal atrial fibrillation) ([place]) 09/06/2015  . Hyponatremia 12/17/2014  . Congestive heart failure ([place]) 12/12/2014  . Murmur 10/19/2011  . Chest  pain 10/19/2011  . Essential hypertension 10/19/2011  . History of cardiac radiofrequency ablation (RFA) 10/19/2011    Past Surgical History:  Procedure Laterality Date  . APPENDECTOMY    . BIOPSY  04/07/2018   Procedure: BIOPSY;  Surgeon: Jerene Bears, MD;  Location[facility]COPY;  Service: Gastroenterology;;  . BIOPSY  02/19/2020   Procedure: BIOPSY;  Surgeon: Harvel Quale, MD;  Location: [facility] ENDO SU[facility];  Service: Gastroenterology;;  gastric   . BLADDER SURGERY    . CARDIAC CATHETERIZATION N/A 09/26/2015   Procedure: Right/Left Heart Cath and Coronary Angiography;  Surgeon: Burnell Blanks, MD;  Location: [facility] CV LAB;  Service: Cardiovascular;  Laterality: N/A;  . CARDIAC SURGERY    . CARDIOVERSION N/A 03/02/2016   Procedure: CARDIOVERSION;  Surgeon: Thayer Headings, MD;  Location: [facility] ENDOSCOPY;  Service: Cardiovascular;  Laterality: N/A;  . ENTEROSCOPY  02/19/2020   Procedure: ENTEROSCOPY;  Surgeon: Harvel Quale, MD;  Location: [facility] ENDO [facility];  Service: Gastroenterology;;  . EP IMPLANTABLE DEVICE N/A 10/26/2015   Procedure: Pacemaker Implant;  Surgeon: Will Meredith Leeds, MD;  Location: [facility] CV LAB;  Service: Cardiovascular;  Laterality: N/A;  . ESOPHAGOGASTRODUODENOSCOPY (EGD) WITH PROPOFOL N/A 04/07/2018   Procedure: ESOPHAGOGASTRODUODENOSCOPY (EGD) WITH PROPOFOL;  Surgeon: Jerene Bears, MD;  Location: [facility] ENDOSCOPY;  Service: Gastroenterology;  Laterality: N/A;  . ESOPHAGOGASTRODUODENOSCOPY (EGD) WITH PROPOFOL N/A 10/12/2019   Procedure: ESOPHAGOGASTRODUODENOSCOPY (EGD) WITH PROPOFOL;  Surgeon: Danie Binder, MD;  [facility];  Service: Endoscopy;  Laterality: N/A;  . EYE SURGERY Bilateral    cataract surgery  . FLEXIBLE SIGMOIDOSCOPY  02/19/2020   Procedure: FLEXIBLE SIGMOIDOSCOPY;  Surgeon: Montez Morita, Quillian Quince, MD;  [facility];  Service: Gastroenterology;;  . HOT HEMOSTASIS N/A 04/07/2018   Procedure: HOT  HEMOSTASIS (ARGON PLASMA COAGULATION/BICAP);  Surgeon: Jerene Bears, MD;  Location: [facility] ENDOSCOPY;  Service: Gastroenterology;  Laterality: N/A;  . HOT HEMOSTASIS  10/12/2019   Procedure: HOT HEMOSTASIS (ARGON PLASMA COAGULATION/BICAP);  Surgeon: Danie Binder, MD;  [facility];  Service: Endoscopy;;  . INTRAMEDULLARY (IM) NAIL INTERTROCHANTERIC Left 03/10/2020   Procedure: OPEN TREATMENT INTERNAL FIXATION LEFT HIP (WITH GAMMA NAIL);  Surgeon: Carole Civil, MD;  Location: [facility] ORS;  Service: Orthopedics;  Laterality: Left;  . NASAL SINUS SURGERY    . PACEMAKER INSERTION    . TEE WITHOUT CARDIOVERSION N/A 10/25/2015   Procedure: TRANSESOPHAGEAL ECHOCARDIOGRAM (TEE);  Surgeon: Burnell Blanks, MD;  Location: [facility];  Service: Open Heart Surgery;  Laterality: N/A;  . TRANSCATHETER AORTIC VALVE REPLACEMENT, TRANSFEMORAL Right 10/25/2015   Procedure: TRANSCATHETER AORTIC VALVE REPLACEMENT, TRANSFEMORAL;  Surgeon: Burnell Blanks, MD;  Location: [facility];  Service: Open Heart Surgery;  Laterality: Right;     OB History    Gravida  3   Para  3  Term  3   Preterm      AB      Living  2     SAB      IAB      Ectopic      Multiple      Live Births              Family History  Problem Relation Age of Onset  . Hypertension Mother   . Pneumonia Father   . Stomach cancer Brother   . Stroke Brother   . AAA (abdominal aortic aneurysm) Brother   . Cervical cancer Daughter   . Heart attack Neg Hx   . Colon cancer Neg Hx   . Esophageal cancer Neg Hx     Social History   Tobacco Use  . Smoking status: Never Smoker  . Smokeless tobacco: Never Used  Vaping Use  . Vaping Use: Never used  Substance Use Topics  . Alcohol use: No    Alcohol/week: 0.0 standard drinks  . Drug use: No    Home Medications Prior to Admission medications   Medication Sig Start Date End Date Taking? Authorizing Provider  albuterol (PROAIR HFA) 108 (90 Base) MCG/ACT  inhaler Inhale 2 puffs into the lungs every 4 (four) hours as needed for wheezing or shortness of breath. 08/05/19   Baruch Gouty, FNP  Biotin 10 MG CAPS Take 10 mg by mouth daily.     [provider]  Budeson-Glycopyrrol-Formoterol (BREZTRI AEROSPHERE) 160-9-4.8 MCG/ACT AERO Inhale 2 Inhalers into the lungs in the morning and at bedtime. 11/27/19   Ivy Lynn, NP  Calcium Carb-Cholecalciferol (CALTRATE 600+D3) 600-800 MG-UNIT TABS Take 1 tablet by mouth daily.    [provider]  docusate sodium (COLACE) 100 MG capsule Take 1 capsule (100 mg total) by mouth 2 (two) times daily. 03/15/20   Barton Dubois, MD  EUTHYROX 25 MCG tablet Take 1 tablet by mouth once daily 03/07/20   [name], MD  famotidine (PEPCID) 20 MG tablet Take 1 tablet (20 mg total) by mouth at bedtime. 12/21/19 12/20/20  [name], MD  folic acid (FOLVITE) 1 MG tablet Take 1 tablet (1 mg total) by mouth daily. 06/21/20   Erlene Quan, PA-C  furosemide (LASIX) 20 MG tablet Take 4 tablets (80 mg total) by mouth daily. 02/22/20   Lelon Perla, MD  iron polysaccharides (NIFEREX) 150 MG capsule Take 1 capsule (150 mg total) by mouth daily. 06/29/20   Lelon Perla, MD  memantine (NAMENDA) 10 MG tablet Take 1 tablet (10 mg total) by mouth 2 (two) times daily. 01/04/20   Garvin Fila, MD  metoprolol tartrate (LOPRESSOR) 25 MG tablet Take 1 tablet (25 mg total) by mouth 2 (two) times daily. 05/20/19   Camnitz, Ocie Doyne, MD  pantoprazole (PROTONIX) 40 MG tablet Take 1 tablet (40 mg total) by mouth 2 (two) times daily. 06/14/20   Hassell Done, Mary-Margaret, FNP  potassium chloride SA (KLOR-CON) 10 MEQ tablet Take 2 tablets (20 mEq total) by mouth 2 (two) times daily. Patient taking differently: Take 10 mEq by mouth 2 (two) times daily. 02/09/20   [name], MD    Allergies    Hctz [hydrochlorothiazide], Aspirin, and Codeine  Review of Systems   Review of Systems  Constitutional: Negative for  appetite change and fatigue.  HENT: Negative for congestion, ear discharge and sinus pressure.   Eyes: Negative for discharge.  Respiratory: Negative for cough.   Cardiovascular: Negative for chest  pain.  Gastrointestinal: Positive for hematochezia. Negative for abdominal pain and diarrhea.       Minimal rectal bleeding  Genitourinary: Negative for frequency and hematuria.  Musculoskeletal: Negative for back pain.  Skin: Negative for rash.  Neurological: Negative for seizures and headaches.  Psychiatric/Behavioral: Negative for hallucinations.    Physical Exam Updated Vital Signs BP (!) 144/61   Pulse (!) 59   Temp 98 F (36.7 C) (Oral)   Resp 19   Ht 5' (1.524 m)   Wt 52.6 kg   SpO2 98%   BMI 22.65 kg/m   Physical Exam Vitals and nursing note reviewed.  Constitutional:      Appearance: She is well-developed.  HENT:     Head: Normocephalic.     Nose: Nose normal.  Eyes:     General: No scleral icterus.    Extraocular Movements: EOM normal.     Conjunctiva/sclera: Conjunctivae normal.  Neck:     Thyroid: No thyromegaly.  Cardiovascular:     Rate and Rhythm: Normal rate and regular rhythm.     Heart sounds: No murmur heard. No friction rub. No gallop.   Pulmonary:     Breath sounds: No stridor. No wheezing or rales.  Chest:     Chest wall: No tenderness.  Abdominal:     General: There is no distension.     Tenderness: There is no abdominal tenderness. There is no rebound.  Genitourinary:    Comments: Rectal exam Brown stool heme positive Musculoskeletal:        General: No edema. Normal range of motion.     Cervical back: Neck supple.  Lymphadenopathy:     Cervical: No cervical adenopathy.  Skin:    Findings: No erythema or rash.  Neurological:     Mental Status: She is alert and oriented to person, place, and time.     Motor: No abnormal muscle tone.     Coordination: Coordination normal.  Psychiatric:        Mood and Affect: Mood and affect normal.         Behavior: Behavior normal.     ED Results / Procedures / Treatments   Labs (all labs ordered are listed, but only abnormal results are displayed) Labs Reviewed  CBC WITH DIFFERENTIAL/PLATELET - Abnormal; Notable for the following components:      Result Value   RBC 3.69 (*)    Hemoglobin 9.8 (*)    HCT 33.0 (*)    MCHC 29.7 (*)    RDW 16.5 (*)    All other components within normal limits  COMPREHENSIVE METABOLIC PANEL - Abnormal; Notable for the following components:   Glucose, Bld 102 (*)    BUN 28 (*)    Creatinine, Ser 1.39 (*)    Calcium 8.7 (*)    Total Protein 6.2 (*)    Albumin 2.8 (*)    GFR, Estimated 36 (*)    All other components within normal limits  POC OCCULT BLOOD, ED - Abnormal; Notable for the following components:   Fecal Occult Bld POSITIVE (*)    All other components within normal limits  POC OCCULT BLOOD, ED    EKG None  Radiology No results found.  Procedures Procedures (including critical care time)  Medications Ordered in ED Medications  sodium chloride 0.9 % bolus 250 mL (0 mLs Intravenous Stopped 07/05/20 1146)  pantoprazole (PROTONIX) injection 40 mg (40 mg Intravenous Given 07/05/20 0854)    ED Course  I have reviewed the  triage vital signs and the nursing notes.  Pertinent labs & imaging results that were available during my care of the patient were reviewed by me and considered in my medical decision making (see chart for details).   Patient with small amounts of rectal bleeding probably upper GI bleed.  Vital signs stable hemoglobin 9.8 slightly lower than discharge.  I spoke with GI and they felt like the patient can be discharged home with close follow-up MDM Rules/Calculators/A&P                          Patient with most likely upper GI bleed that stable. Her daughter will closely monitor her and get her to see her GI doctor's within a week Final Clinical Impression(s) / ED Diagnoses Final diagnoses:  UGI bleed    Rx /  DC Orders ED Discharge Orders    None       Milton Ferguson, MD 07/09/20 [phone]

## 2020-07-05 NOTE — ED Triage Notes (Signed)
Pts daughter reports patient passed bright red stool this morning. Pt denies dizziness and lightheadedness.

## 2020-07-06 ENCOUNTER — Telehealth: Payer: Self-pay

## 2020-07-06 ENCOUNTER — Other Ambulatory Visit: Payer: Self-pay | Admitting: Family Medicine

## 2020-07-06 DIAGNOSIS — I69351 Hemiplegia and hemiparesis following cerebral infarction affecting right dominant side: Secondary | ICD-10-CM | POA: Diagnosis not present

## 2020-07-06 DIAGNOSIS — F0280 Dementia in other diseases classified elsewhere without behavioral disturbance: Secondary | ICD-10-CM | POA: Diagnosis not present

## 2020-07-06 DIAGNOSIS — F0150 Vascular dementia without behavioral disturbance: Secondary | ICD-10-CM | POA: Diagnosis not present

## 2020-07-06 DIAGNOSIS — S72002D Fracture of unspecified part of neck of left femur, subsequent encounter for closed fracture with routine healing: Secondary | ICD-10-CM | POA: Diagnosis not present

## 2020-07-06 DIAGNOSIS — J449 Chronic obstructive pulmonary disease, unspecified: Secondary | ICD-10-CM | POA: Diagnosis not present

## 2020-07-06 DIAGNOSIS — G309 Alzheimer's disease, unspecified: Secondary | ICD-10-CM | POA: Diagnosis not present

## 2020-07-06 MED ORDER — LEVALBUTEROL HCL 1.25 MG/3ML IN NEBU: 1.2500 mg | INHALATION_SOLUTION | RESPIRATORY_TRACT | 12 refills | Status: DC | PRN

## 2020-07-06 NOTE — Telephone Encounter (Signed)
Please let the patient know that I sent their prescription to their pharmacy. Thanks, WS 

## 2020-07-07 ENCOUNTER — Telehealth: Payer: Self-pay

## 2020-07-07 ENCOUNTER — Encounter: Payer: Self-pay | Admitting: Gastroenterology

## 2020-07-07 ENCOUNTER — Other Ambulatory Visit: Payer: Self-pay

## 2020-07-07 ENCOUNTER — Ambulatory Visit (INDEPENDENT_AMBULATORY_CARE_PROVIDER_SITE_OTHER): Payer: Medicare Other | Admitting: Gastroenterology

## 2020-07-07 ENCOUNTER — Other Ambulatory Visit: Payer: Self-pay | Admitting: *Deleted

## 2020-07-07 ENCOUNTER — Other Ambulatory Visit (INDEPENDENT_AMBULATORY_CARE_PROVIDER_SITE_OTHER): Payer: Medicare Other

## 2020-07-07 VITALS — BP 112/56 | HR 48 | Ht 60.0 in | Wt 119.4 lb

## 2020-07-07 DIAGNOSIS — I2510 Atherosclerotic heart disease of native coronary artery without angina pectoris: Secondary | ICD-10-CM | POA: Diagnosis not present

## 2020-07-07 DIAGNOSIS — D500 Iron deficiency anemia secondary to blood loss (chronic): Secondary | ICD-10-CM

## 2020-07-07 DIAGNOSIS — J45909 Unspecified asthma, uncomplicated: Secondary | ICD-10-CM

## 2020-07-07 DIAGNOSIS — K625 Hemorrhage of anus and rectum: Secondary | ICD-10-CM | POA: Diagnosis not present

## 2020-07-07 LAB — IBC PANEL
Iron: 25 ug/dL — ABNORMAL LOW (ref 42–145)
Saturation Ratios: 7.5 % — ABNORMAL LOW (ref 20.0–50.0)
Transferrin: 239 mg/dL (ref 212.0–360.0)

## 2020-07-07 LAB — CBC
HCT: 29.2 % — ABNORMAL LOW (ref 36.0–46.0)
Hemoglobin: 9.5 g/dL — ABNORMAL LOW (ref 12.0–15.0)
MCHC: 32.5 g/dL (ref 30.0–36.0)
MCV: 82.1 fl (ref 78.0–100.0)
Platelets: 293 10*3/uL (ref 150.0–400.0)
RBC: 3.55 Mil/uL — ABNORMAL LOW (ref 3.87–5.11)
RDW: 18.6 % — ABNORMAL HIGH (ref 11.5–15.5)
WBC: 5.7 10*3/uL (ref 4.0–10.5)

## 2020-07-07 LAB — FERRITIN: Ferritin: 20.7 ng/mL (ref 10.0–291.0)

## 2020-07-07 MED ORDER — LEVALBUTEROL HCL 1.25 MG/3ML IN NEBU: 1.2500 mg | INHALATION_SOLUTION | RESPIRATORY_TRACT | 12 refills | Status: DC | PRN

## 2020-07-07 MED ORDER — LEVALBUTEROL HCL 1.25 MG/3ML IN NEBU: 1.2500 mg | INHALATION_SOLUTION | RESPIRATORY_TRACT | 12 refills | Status: AC | PRN

## 2020-07-07 NOTE — Progress Notes (Signed)
Addendum: Reviewed and agree with assessment and management plan. Inri Sobieski M, MD  

## 2020-07-07 NOTE — Telephone Encounter (Signed)
Makynzee Anastasia KeyGardiner Sleeper - PA Case ID: [id number] - Rx #: [id number] Need help? Call us at [phone]  Status Sent to [facility] Levalbuterol HCl 1.25MG /3ML nebulizer solution  Form [facility] Electronic PA Form [phone] NCPDP) [facility]  ALBUTEROL SULFATE [phone] VENTOLIN HFA [phone] [place] [phone]

## 2020-07-07 NOTE — Telephone Encounter (Signed)
Aware. 

## 2020-07-07 NOTE — Progress Notes (Signed)
07/07/2020 Crystal Cooper [id number] 03-Nov-1926   HISTORY OF PRESENT ILLNESS:  This is a 87 year old female with a history of iron deficiency anemia due to chronic GI blood loss, history of gastric angioectasias, remote colon polyps, recent CVA on Eliquis, history of atrial fibrillation, history of aortic stenosis and CHF, hypertension who is seen for follow-up.  She is here today with her daughter.  She tells me that over the weekend her mother had 2 episodes of bright red blood per rectum with bowel movements.  This was seen as a small to moderate amount in the toilet bowl.  She has not had any recurrence since then.  She continues to have ongoing dark stools due to her iron supplementation.   She had a CVA in March 2021, she was on Eliquis, but has now been off of that for several months.    Dr. Oneida Alar performed an upper endoscopy on 10/12/2019.  This revealed a single angiectasia on the greater curvature of the stomach which was ablated using APC.  The exam was otherwise normal.  She has remained on oral iron supplements.  Then in August 2021 she underwent a small bowel endoscopy by Dr. Jenetta Downer at [facility] that showed the following:  - Normal esophagus. - Oozing gastric ulcer with an adherent clot (Forrest Class IIb). Treated with bipolar cautery. Biopsied. - Gastric mucosal atrophy. - Normal examined duodenum. - The examined portion of the jejunum was normal.  Pathology showed chronic gastritis and intestinal metaplasia, negative for dysplasia and negative for H. Pylori.  They tell me that back in November she did get an iron infusion and about 2 weeks ago her hemoglobin was down to 7.0 g and she received 2 units of packed red blood cells.  Her hemoglobin 2 days ago on January 11 was 9.8 g.  Her daughter tells me that her PCP wanted it rechecked by the end of the week.  Otherwise they do not have any other complaints.  She does not complain of abdominal pain, rectal pain, she has  regular bowel movements without constipation or diarrhea.  They say her appetite is good.  Her weight stable.  She does not have any nausea or vomiting.   Last colonoscopy was in 2008 with a few diverticula, one hyperplastic and one serrated adenomatous polyp.    Past Medical History:  Diagnosis Date  . AKI (acute kidney injury) ([place])   . Anemia    years ago  . Aortic stenosis   . Arthritis   . Asthma   . Atrial fibrillation ([place])   . Blood transfusion without reported diagnosis   . CHF (congestive heart failure) ([place]) 11/2014  . CKD (chronic kidney disease)    had many uti this year   . Dementia ([place])   . Family history of adverse reaction to anesthesia    2 daughters would have N/V  . Gastric AVM   . GERD (gastroesophageal reflux disease)    was seen by Gi for bleeding ulcer which was cauterized   . GI bleed   . Glaucoma   . Hearing loss   . Heart murmur    Rheumatic fever whe she was young,  sees Dr. Stanford Breed in [place]  . HTN (hypertension)   . Hypokalemia   . Myocardial infarction ([place])    in her 87 when she had two heart attacks   . Pneumonia    stayed from friday to sunday in august  . Presence of permanent cardiac  pacemaker    placed about 4 years ago  . Prolapsing mitral leaflet syndrome   . Shortness of breath dyspnea    with exertion  . Stroke ([place])   . SVT (supraventricular tachycardia) ([facility])    S/P ablation of AVNRT in 2003   Past Surgical History:  Procedure Laterality Date  . APPENDECTOMY    . BIOPSY  04/07/2018   Procedure: BIOPSY;  Surgeon: Jerene Bears, MD;  Location: [facility] ENDOSCOPY;  Service: Gastroenterology;;  . BIOPSY  02/19/2020   Procedure: BIOPSY;  Surgeon: Harvel Quale, MD;  Location: [facility] ENDO SUITE;  Service: Gastroenterology;;  gastric   . BLADDER SURGERY    . CARDIAC CATHETERIZATION N/A 09/26/2015   Procedure: Right/Left Heart Cath and Coronary Angiography;  Surgeon: Burnell Blanks, MD;  Location: [facility] CV  LAB;  Service: Cardiovascular;  Laterality: N/A;  . CARDIAC SURGERY    . CARDIOVERSION N/A 03/02/2016   Procedure: CARDIOVERSION;  Surgeon: Thayer Headings, MD;  Location: [facility] ENDOSCOPY;  Service: Cardiovascular;  Laterality: N/A;  . ENTEROSCOPY  02/19/2020   Procedure: ENTEROSCOPY;  Surgeon: Harvel Quale, MD;  Location: [facility];  Service: Gastroenterology;;  . EP IMPLANTABLE DEVICE N/A 10/26/2015   Procedure: Pacemaker Implant;  Surgeon: Will Meredith Leeds, MD;  Location: [facility] LAB;  Service: Cardiovascular;  Laterality: N/A;  . ESOPHAGOGASTRODUODENOSCOPY (EGD) WITH PROPOFOL N/A 04/07/2018   Procedure: ESOPHAGOGASTRODUODENOSCOPY (EGD) WITH PROPOFOL;  Surgeon: Jerene Bears, MD;  Location: [facility] ENDOSCOPY;  Service: Gastroenterology;  Laterality: N/A;  . ESOPHAGOGASTRODUODENOSCOPY (EGD) WITH PROPOFOL N/A 10/12/2019   Procedure: ESOPHAGOGASTRODUODENOSCOPY (EGD) WITH PROPOFOL;  Surgeon: Danie Binder, MD;  [facility];  Service: Endoscopy;  Laterality: N/A;  . EYE SURGERY Bilateral    cataract surgery  . FLEXIBLE SIGMOIDOSCOPY  02/19/2020   Procedure: FLEXIBLE SIGMOIDOSCOPY;  Surgeon: Montez Morita, Quillian Quince, MD;  Location: [facility];  Service: Gastroenterology;;  . HOT HEMOSTASIS N/A 04/07/2018   Procedure: HOT HEMOSTASIS (ARGON PLASMA COAGULATION/BICAP);  Surgeon: Jerene Bears, MD;  Location: [facility] ENDOSCOPY;  Service: Gastroenterology;  Laterality: N/A;  . HOT HEMOSTASIS  10/12/2019   Procedure: HOT HEMOSTASIS (ARGON PLASMA COAGULATION/BICAP);  Surgeon: Danie Binder, MD;  Location[facility];  Service: Endoscopy;;  . INTRAMEDULLARY (IM) NAIL INTERTROCHANTERIC Left 03/10/2020   Procedure: OPEN TREATMENT INTERNAL FIXATION LEFT HIP (WITH GAMMA NAIL);  Surgeon: Carole Civil, MD;  Location: [facility] ORS;  Service: Orthopedics;  Laterality: Left;  . NASAL SINUS SURGERY    . PACEMAKER INSERTION    . TEE WITHOUT CARDIOVERSION N/A 10/25/2015   Procedure:  TRANSESOPHAGEAL ECHOCARDIOGRAM (TEE);  Surgeon: Burnell Blanks, MD;  Location: [place];  Service: Open Heart Surgery;  Laterality: N/A;  . TRANSCATHETER AORTIC VALVE REPLACEMENT, TRANSFEMORAL Right 10/25/2015   Procedure: TRANSCATHETER AORTIC VALVE REPLACEMENT, TRANSFEMORAL;  Surgeon: Burnell Blanks, MD;  Location: [facility];  Service: Open Heart Surgery;  Laterality: Right;    reports that she has never smoked. She has never used smokeless tobacco. She reports that she does not drink alcohol and does not use drugs. family history includes AAA (abdominal aortic aneurysm) in her brother; Cervical cancer in her daughter; Hypertension in her mother; Pneumonia in her father; Stomach cancer in her brother; Stroke in her brother. Allergies  Allergen Reactions  . Hctz [Hydrochlorothiazide] Other (See Comments)    Pt was ill and this affected her kidneys   . Aspirin Other (See Comments)    Cardiologist said the  patient is to not take this  . Codeine Other (See Comments)    Made the patient feel ill, has not had any problems since 1977      Outpatient Encounter Medications as of 07/07/2020  Medication Sig  . albuterol (PROAIR HFA) 108 (90 Base) MCG/ACT inhaler Inhale 2 puffs into the lungs every 4 (four) hours as needed for wheezing or shortness of breath.  . Biotin 10 MG CAPS Take 10 mg by mouth daily.   . Budeson-Glycopyrrol-Formoterol (BREZTRI AEROSPHERE) 160-9-4.8 MCG/ACT AERO Inhale 2 Inhalers into the lungs in the morning and at bedtime.  . Calcium Carb-Cholecalciferol (CALTRATE 600+D3) 600-800 MG-UNIT TABS Take 1 tablet by mouth daily.  [name] docusate sodium (COLACE) 100 MG capsule Take 1 capsule (100 mg total) by mouth 2 (two) times daily.  [name] 25 MCG tablet Take 1 tablet by mouth once daily  . famotidine (PEPCID) 20 MG tablet Take 1 tablet (20 mg total) by mouth at bedtime.  . folic acid (FOLVITE) 1 MG tablet Take 1 tablet (1 mg total) by mouth daily.  . furosemide (LASIX) 20 MG  tablet Take 4 tablets (80 mg total) by mouth daily.  . iron polysaccharides (NIFEREX) 150 MG capsule Take 1 capsule (150 mg total) by mouth daily.  [name] levalbuterol (XOPENEX) 1.25 MG/3ML nebulizer solution Take 1.25 mg by nebulization every 4 (four) hours as needed for wheezing.  . memantine (NAMENDA) 10 MG tablet Take 1 tablet (10 mg total) by mouth 2 (two) times daily.  . metoprolol tartrate (LOPRESSOR) 25 MG tablet Take 1 tablet (25 mg total) by mouth 2 (two) times daily.  . pantoprazole (PROTONIX) 40 MG tablet Take 1 tablet (40 mg total) by mouth 2 (two) times daily.  . potassium chloride SA (KLOR-CON) 10 MEQ tablet Take 2 tablets (20 mEq total) by mouth 2 (two) times daily. (Patient taking differently: Take 10 mEq by mouth 2 (two) times daily.)   No facility-administered encounter medications on file as of 07/07/2020.    REVIEW OF SYSTEMS  : All other systems reviewed and negative except where noted in the History of Present Illness.   PHYSICAL EXAM: Ht 5' (1.524 m)   Wt 119 lb 6.4 oz (54.2 kg)   BMI 23.32 kg/m  General:  Elderly, frail white female in no acute distress; in wheelchair Head: Normocephalic and atraumatic Eyes:  Sclerae anicteric, conjunctiva pink. Ears: Normal auditory acuity Lungs: Clear throughout to auscultation; no W/R/R. Heart: Irregularly irregular.  Bradycardic. Abdomen: Soft, non-distended.  BS present.  Non-tender. Musculoskeletal: Symmetrical with no gross deformities  Skin: No lesions on visible extremities Extremities: No edema  Neurological: Alert oriented x 4, grossly non-focal Psychological:  Alert and cooperative. Normal mood and affect  ASSESSMENT AND PLAN: 87 year old female with a history of iron deficiency anemia due to chronic GI blood loss, history of gastric angioectasias, remote colon polyps, fairly recent CVA (March 2021) previously on Eliquis but that has been discontinued several months ago now, history of atrial fibrillation, history of  aortic stenosis and CHF, hypertension who is seen for follow-up.   IDA due to chronic blood loss/history of gastric angiectasia --she is no longer on Eliquis for the past several months now.  She has ongoing dark stools due to iron supplements.  Had two episodes of bright red blood in the toilet bowl with bowel movements over the weekend.  Her hemoglobin has bounced around and she required 2 units of packed red blood cells a couple of weeks ago.  Had  a dose of IV iron back in November.  We discussed further GI evaluation with colonoscopy but at this point I feel that this test is too high risk for her.  Her daughter is in agreement and wishes to avoid colonoscopy.  Thus we would recommend continued close monitoring of blood counts and iron studies with supportive care.  She is on oral iron and if iron remains low I would repeat IV iron as needed. --CBC and iron studies today. --If iron studies low then would recommend another IV iron infusion. --Needs close monitoring of her Hgb. --No GI evaluation for now, continued supportive care.  CC:  [name], MD

## 2020-07-07 NOTE — Patient Instructions (Signed)
If you are age 85 or older, your body mass index should be between 23-30. Your Body mass index is 23.32 kg/m. If this is out of the aforementioned range listed, please consider follow up with your Primary Care Provider.  If you are age 23 or younger, your body mass index should be between 19-25. Your Body mass index is 23.32 kg/m. If this is out of the aformentioned range listed, please consider follow up with your Primary Care Provider.   Your provider has requested that you go to the basement level for lab work before leaving today. Press "B" on the elevator. The lab is located at the first door on the left as you exit the elevator.  Follow up pending that results of your labs.  Thank you for entrusting me with your care and choosing [facility].  Alonza Bogus, PA-C

## 2020-07-07 NOTE — Telephone Encounter (Signed)
Aware Rx corrected and resent to pharmacy

## 2020-07-08 DIAGNOSIS — J449 Chronic obstructive pulmonary disease, unspecified: Secondary | ICD-10-CM | POA: Diagnosis not present

## 2020-07-08 DIAGNOSIS — I69351 Hemiplegia and hemiparesis following cerebral infarction affecting right dominant side: Secondary | ICD-10-CM | POA: Diagnosis not present

## 2020-07-08 DIAGNOSIS — F0280 Dementia in other diseases classified elsewhere without behavioral disturbance: Secondary | ICD-10-CM | POA: Diagnosis not present

## 2020-07-08 DIAGNOSIS — S72002D Fracture of unspecified part of neck of left femur, subsequent encounter for closed fracture with routine healing: Secondary | ICD-10-CM | POA: Diagnosis not present

## 2020-07-08 DIAGNOSIS — F0150 Vascular dementia without behavioral disturbance: Secondary | ICD-10-CM | POA: Diagnosis not present

## 2020-07-08 DIAGNOSIS — G309 Alzheimer's disease, unspecified: Secondary | ICD-10-CM | POA: Diagnosis not present

## 2020-07-11 ENCOUNTER — Ambulatory Visit: Payer: Self-pay | Admitting: Adult Health

## 2020-07-11 NOTE — Telephone Encounter (Signed)
LMTCB No call back  This encounter will be closed

## 2020-07-12 ENCOUNTER — Other Ambulatory Visit: Payer: Self-pay | Admitting: Nurse Practitioner

## 2020-07-13 DIAGNOSIS — S72002D Fracture of unspecified part of neck of left femur, subsequent encounter for closed fracture with routine healing: Secondary | ICD-10-CM | POA: Diagnosis not present

## 2020-07-13 DIAGNOSIS — J449 Chronic obstructive pulmonary disease, unspecified: Secondary | ICD-10-CM | POA: Diagnosis not present

## 2020-07-13 DIAGNOSIS — F0280 Dementia in other diseases classified elsewhere without behavioral disturbance: Secondary | ICD-10-CM | POA: Diagnosis not present

## 2020-07-13 DIAGNOSIS — F0150 Vascular dementia without behavioral disturbance: Secondary | ICD-10-CM | POA: Diagnosis not present

## 2020-07-13 DIAGNOSIS — G309 Alzheimer's disease, unspecified: Secondary | ICD-10-CM | POA: Diagnosis not present

## 2020-07-13 DIAGNOSIS — I69351 Hemiplegia and hemiparesis following cerebral infarction affecting right dominant side: Secondary | ICD-10-CM | POA: Diagnosis not present

## 2020-07-14 ENCOUNTER — Other Ambulatory Visit: Payer: Self-pay

## 2020-07-14 ENCOUNTER — Inpatient Hospital Stay (HOSPITAL_COMMUNITY): Payer: Medicare Other | Attending: Oncology | Admitting: Oncology

## 2020-07-14 DIAGNOSIS — F0280 Dementia in other diseases classified elsewhere without behavioral disturbance: Secondary | ICD-10-CM | POA: Diagnosis not present

## 2020-07-14 DIAGNOSIS — F0150 Vascular dementia without behavioral disturbance: Secondary | ICD-10-CM | POA: Diagnosis not present

## 2020-07-14 DIAGNOSIS — S72002D Fracture of unspecified part of neck of left femur, subsequent encounter for closed fracture with routine healing: Secondary | ICD-10-CM | POA: Diagnosis not present

## 2020-07-14 DIAGNOSIS — J449 Chronic obstructive pulmonary disease, unspecified: Secondary | ICD-10-CM | POA: Diagnosis not present

## 2020-07-14 DIAGNOSIS — D500 Iron deficiency anemia secondary to blood loss (chronic): Secondary | ICD-10-CM | POA: Diagnosis not present

## 2020-07-14 DIAGNOSIS — D631 Anemia in chronic kidney disease: Secondary | ICD-10-CM | POA: Insufficient documentation

## 2020-07-14 DIAGNOSIS — K625 Hemorrhage of anus and rectum: Secondary | ICD-10-CM | POA: Diagnosis not present

## 2020-07-14 DIAGNOSIS — N189 Chronic kidney disease, unspecified: Secondary | ICD-10-CM | POA: Insufficient documentation

## 2020-07-14 DIAGNOSIS — I69351 Hemiplegia and hemiparesis following cerebral infarction affecting right dominant side: Secondary | ICD-10-CM | POA: Diagnosis not present

## 2020-07-14 DIAGNOSIS — I129 Hypertensive chronic kidney disease with stage 1 through stage 4 chronic kidney disease, or unspecified chronic kidney disease: Secondary | ICD-10-CM | POA: Insufficient documentation

## 2020-07-14 DIAGNOSIS — Z79899 Other long term (current) drug therapy: Secondary | ICD-10-CM | POA: Insufficient documentation

## 2020-07-14 DIAGNOSIS — G309 Alzheimer's disease, unspecified: Secondary | ICD-10-CM | POA: Diagnosis not present

## 2020-07-14 NOTE — Progress Notes (Signed)
CONSULT NOTE  Patient Care Team: [name], MD as PCP - General (Family Medicine) Stanford Breed Denice Bors, MD as PCP - Cardiology (Cardiology) Ander Slade, Carlisle Beers, MD as Referring Physician (Ophthalmology) Stanford Breed Denice Bors, MD as Consulting Physician (Cardiology) Constance Haw, MD as Consulting Physician (Cardiology) Ilean China, RN as Case Manager (General Practice) Shea Evans, [name], LCSW as Social Worker (Licensed Clinical Social Worker)  CHIEF COMPLAINTS/PURPOSE OF CONSULTATION: Anemia  I connected with Crystal Cooper on 07/14/20 at  9:50 AM EST by telephone visit and verified that I am speaking with the correct person using two identifiers.   I discussed the limitations, risks, security and privacy concerns of performing an evaluation and management service by telemedicine and the availability of in-person appointments. I also discussed with the patient that there may be a patient responsible charge related to this service. The patient expressed understanding and agreed to proceed.   Other persons participating in the visit and their role in the encounter: Daughter   Patient's location: Home Provider's location: Clinic     HISTORY OF PRESENTING ILLNESS:  Crystal Cooper 87 y.o. female is here for follow-up for anemia secondary to chronic kidney disease and iron deficiency.  She was last seen in our clinic on 12/29/2019 for initial consultation for anemia.  Work-up included labs which revealed low iron and B12 levels. She was given 2 doses of IV Feraheme on 01/01/2020 and 01/08/2020.  She was also given vitamin B12 on 01/18/2020.   Her history was reviewed by Dr. Delton Coombes.  She has longstanding history of intermittent GI bleeding and has been on and off anticoagulation secondary to atrial fibrillation.  She unfortunately suffered a stroke while off Eliquis in March 2021.  She has had several iron infusions in her past that were helpful.  She has chronic kidney disease  with a baseline creatinine between 1.3 and 1.6.  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   In the interim she has been in and out of the hospital.  02/12/2020-02/14/2020 for shortness of breath.  She was treated for pneumonia. 02/18/2020-02/21/2020 for hematochezia.  Had EGD showing oozing gastric ulcer.  She did not require a blood transfusion.  Hemoglobin stable at discharge 02/29/2020 for chest pain.  Cardiac work-up was negative. 03/09/2020-03/15/2020 for mechanical fall.  Suffered left hip fracture required ORIF with gamma implant.  Sent to SNF for rehab and conditioning per PT.  She was discharged home from SNF on 05/27/2020.  06/22/2020-06/23/2020 for GI bleeding.  Hemoglobin was 7.4 on admission.  She was given 2 units of packed red blood cells.  Discharged with hemoglobin of 11.4. 07/05/2020 for upper GI bleed.  Hemoccult positive x2.  Did not require blood transfusion.  Hemoglobin stable at 9.8.  She was evaluated by GI on 07/07/2020 for follow-up.  They recommended against additional work-up such as colonoscopy given her age.  Today, her daughter reports that she is stable.  She has not had any additional bleeding since last week.  She is scheduled for an iron infusion tomorrow that was set up by GI.  She is recovering from her hip fracture well.  She continues home physical therapy.   MEDICAL HISTORY:  Past Medical History:  Diagnosis Date  . AKI (acute kidney injury) ([place])   . Anemia    years ago  . Aortic stenosis   . Arthritis   . Asthma   . Atrial fibrillation ([place])   . Blood transfusion without  reported diagnosis   . CHF (congestive heart failure) ([place]) 11/2014  . CKD (chronic kidney disease)    had many uti this year   . Dementia ([place])   . Family history of adverse reaction to anesthesia    2 daughters would have N/V  . Gastric AVM   . GERD (gastroesophageal reflux disease)    was seen by Gi for bleeding ulcer which was cauterized    . GI bleed   . Glaucoma   . Hearing loss   . Heart murmur    Rheumatic fever whe she was young,  sees Dr. Stanford Breed in [place]  . HTN (hypertension)   . Hypokalemia   . Myocardial infarction ([place])    in her 42 when she had two heart attacks   . Pneumonia    stayed from friday to sunday in august  . Presence of permanent cardiac pacemaker    placed about 4 years ago  . Prolapsing mitral leaflet syndrome   . Shortness of breath dyspnea    with exertion  . Stroke ([place])   . SVT (supraventricular tachycardia) ([facility])    S/P ablation of AVNRT in 2003    SURGICAL HISTORY: Past Surgical History:  Procedure Laterality Date  . APPENDECTOMY    . BIOPSY  04/07/2018   Procedure: BIOPSY;  Surgeon: Jerene Bears, MD;  Location: [facility] ENDOSCOPY;  Service: Gastroenterology;;  . BIOPSY  02/19/2020   Procedure: BIOPSY;  Surgeon: Harvel Quale, MD;  [facility];  Service: Gastroenterology;;  gastric   . BLADDER SURGERY    . CARDIAC CATHETERIZATION N/A 09/26/2015   Procedure: Right/Left Heart Cath and Coronary Angiography;  Surgeon: Burnell Blanks, MD;  Location: [facility] CV LAB;  Service: Cardiovascular;  Laterality: N/A;  . CARDIAC SURGERY    . CARDIOVERSION N/A 03/02/2016   Procedure: CARDIOVERSION;  Surgeon: Thayer Headings, MD;  Location: [facility] ENDOSCOPY;  Service: Cardiovascular;  Laterality: N/A;  . ENTEROSCOPY  02/19/2020   Procedure: ENTEROSCOPY;  Surgeon: Harvel Quale, MD;  [facility];  Service: Gastroenterology;;  . EP IMPLANTABLE DEVICE N/A 10/26/2015   Procedure: Pacemaker Implant;  Surgeon: Will Meredith Leeds, MD;  Location: [facility] LAB;  Service: Cardiovascular;  Laterality: N/A;  . ESOPHAGOGASTRODUODENOSCOPY (EGD) WITH PROPOFOL N/A 04/07/2018   Procedure: ESOPHAGOGASTRODUODENOSCOPY (EGD) WITH PROPOFOL;  Surgeon: Jerene Bears, MD;  Location: [facility] ENDOSCOPY;  Service: Gastroenterology;  Laterality: N/A;  .  ESOPHAGOGASTRODUODENOSCOPY (EGD) WITH PROPOFOL N/A 10/12/2019   Procedure: ESOPHAGOGASTRODUODENOSCOPY (EGD) WITH PROPOFOL;  Surgeon: Danie Binder, MD;  [facility];  Service: Endoscopy;  Laterality: N/A;  . EYE SURGERY Bilateral    cataract surgery  . FLEXIBLE SIGMOIDOSCOPY  02/19/2020   Procedure: FLEXIBLE SIGMOIDOSCOPY;  Surgeon: Harvel Quale, MD;  [facility];  Service: Gastroenterology;;  . HIP FRACTURE SURGERY Left 02/2020  . HOT HEMOSTASIS N/A 04/07/2018   Procedure: HOT HEMOSTASIS (ARGON PLASMA COAGULATION/BICAP);  Surgeon: Jerene Bears, MD;  [facility] ENDOSCOPY;  Service: Gastroenterology;  Laterality: N/A;  . HOT HEMOSTASIS  10/12/2019   Procedure: HOT HEMOSTASIS (ARGON PLASMA COAGULATION/BICAP);  Surgeon: Danie Binder, MD;  [facility];  Service: Endoscopy;;  . INTRAMEDULLARY (IM) NAIL INTERTROCHANTERIC Left 03/10/2020   Procedure: OPEN TREATMENT INTERNAL FIXATION LEFT HIP (WITH GAMMA NAIL);  Surgeon: Carole Civil, MD;  Location: [facility] ORS;  Service: Orthopedics;  Laterality: Left;  . NASAL SINUS SURGERY    . PACEMAKER INSERTION    .  TEE WITHOUT CARDIOVERSION N/A 10/25/2015   Procedure: TRANSESOPHAGEAL ECHOCARDIOGRAM (TEE);  Surgeon: Burnell Blanks, MD;  Location: [place];  Service: Open Heart Surgery;  Laterality: N/A;  . TRANSCATHETER AORTIC VALVE REPLACEMENT, TRANSFEMORAL Right 10/25/2015   Procedure: TRANSCATHETER AORTIC VALVE REPLACEMENT, TRANSFEMORAL;  Surgeon: Burnell Blanks, MD;  Location: [place];  Service: Open Heart Surgery;  Laterality: Right;    SOCIAL HISTORY: Social History   Socioeconomic History  . Marital status: Widowed    Spouse name: Not on file  . Number of children: 3  . Years of education: Not on file  . Highest education level: 12th grade  Occupational History  . Occupation: Retired  Tobacco Use  . Smoking status: Never Smoker  . Smokeless tobacco: Never Used  Vaping Use  . Vaping  Use: Never used  Substance and Sexual Activity  . Alcohol use: No    Alcohol/week: 0.0 standard drinks  . Drug use: No  . Sexual activity: Not Currently  Other Topics Concern  . Not on file  Social History Narrative  . Not on file   Social Determinants of Health   Financial Resource Strain: Low Risk   . Difficulty of Paying Living Expenses: Not hard at all  Food Insecurity: No Food Insecurity  . Worried About Charity fundraiser in the Last Year: Never true  . Ran Out of Food in the Last Year: Never true  Transportation Needs: Not on file  Physical Activity: Inactive  . Days of Exercise per Week: 0 days  . Minutes of Exercise per Session: 0 min  Stress: No Stress Concern Present  . Feeling of Stress : Not at all  Social Connections: Moderately Integrated  . Frequency of Communication with Friends and Family: More than three times a week  . Frequency of Social Gatherings with Friends and Family: More than three times a week  . Attends Religious Services: More than 4 times per year  . Active Member of Clubs or Organizations: Yes  . Attends Archivist Meetings: More than 4 times per year  . Marital Status: Widowed  Intimate Partner Violence: Not At Risk  . Fear of Current or Ex-Partner: No  . Emotionally Abused: No  . Physically Abused: No  . Sexually Abused: No    FAMILY HISTORY: Family History  Problem Relation Age of Onset  . Hypertension Mother   . Pneumonia Father   . Stomach cancer Brother   . Stroke Brother   . AAA (abdominal aortic aneurysm) Brother   . Cervical cancer Daughter   . Heart attack Neg Hx   . Colon cancer Neg Hx   . Esophageal cancer Neg Hx     ALLERGIES:  is allergic to hctz [hydrochlorothiazide], aspirin, and codeine.  MEDICATIONS:  Current Outpatient Medications  Medication Sig Dispense Refill  . albuterol (PROAIR HFA) 108 (90 Base) MCG/ACT inhaler Inhale 2 puffs into the lungs every 4 (four) hours as needed for wheezing or  shortness of breath. 18 g 11  . Biotin 10 MG CAPS Take 10 mg by mouth daily.     . Budeson-Glycopyrrol-Formoterol (BREZTRI AEROSPHERE) 160-9-4.8 MCG/ACT AERO Inhale 2 Inhalers into the lungs in the morning and at bedtime. 5.9 g 1  . Calcium Carb-Cholecalciferol (CALTRATE 600+D3) 600-800 MG-UNIT TABS Take 1 tablet by mouth daily.    [name] docusate sodium (COLACE) 100 MG capsule Take 1 capsule (100 mg total) by mouth 2 (two) times daily.    [name] 25 MCG tablet Take  1 tablet by mouth once daily 30 tablet 7  . famotidine (PEPCID) 20 MG tablet Take 1 tablet (20 mg total) by mouth at bedtime. 90 tablet 1  . folic acid (FOLVITE) 1 MG tablet Take 1 tablet (1 mg total) by mouth daily. 30 tablet 6  . furosemide (LASIX) 20 MG tablet Take 4 tablets (80 mg total) by mouth daily. 270 tablet 3  . iron polysaccharides (NIFEREX) 150 MG capsule Take 1 capsule (150 mg total) by mouth daily. 30 capsule 3  . levalbuterol (XOPENEX) 1.25 MG/3ML nebulizer solution Take 1.25 mg by nebulization every 4 (four) hours as needed for wheezing. Dx J45.909 72 mL 12  . memantine (NAMENDA) 10 MG tablet Take 1 tablet (10 mg total) by mouth 2 (two) times daily. 180 tablet 1  . metoprolol tartrate (LOPRESSOR) 25 MG tablet Take 1 tablet (25 mg total) by mouth 2 (two) times daily. 180 tablet 3  . pantoprazole (PROTONIX) 40 MG tablet Take 1 tablet by mouth twice daily 60 tablet 2  . potassium chloride SA (KLOR-CON) 10 MEQ tablet Take 2 tablets (20 mEq total) by mouth 2 (two) times daily. (Patient taking differently: Take 10 mEq by mouth 2 (two) times daily.) 60 tablet 2   No current facility-administered medications for this visit.    REVIEW OF SYSTEMS:   Review of Systems  Constitutional: Negative.  Negative for chills, fever, malaise/fatigue and weight loss.  HENT: Negative for congestion, ear pain and tinnitus.   Eyes: Negative.  Negative for blurred vision and double vision.  Respiratory: Negative.  Negative for cough, sputum  production and shortness of breath.   Cardiovascular: Negative.  Negative for chest pain, palpitations and leg swelling.  Gastrointestinal: Negative.  Negative for abdominal pain, constipation, diarrhea, nausea and vomiting.  Genitourinary: Negative for dysuria, frequency and urgency.  Musculoskeletal: Negative for back pain and falls.  Skin: Negative.  Negative for rash.  Neurological: Negative.  Negative for weakness and headaches.  Endo/Heme/Allergies: Negative.  Does not bruise/bleed easily.  Psychiatric/Behavioral: Negative.  Negative for depression. The patient is not nervous/anxious and does not have insomnia.     PHYSICAL EXAMINATION: ECOG PERFORMANCE STATUS: 1 - Symptomatic but completely ambulatory  There were no vitals filed for this visit. There were no vitals filed for this visit.  -Limited secondary to virtual visit. -In no acute distress.   LABORATORY DATA:  I have reviewed the data as listed Recent Results (from the past 2160 hour(s))  CUP PACEART REMOTE DEVICE CHECK     Status: None   Collection Time: 05/28/20 11:38 AM  Result Value Ref Range   Date Time Interrogation Session [id number]    Pulse Generator Manufacturer MERM    Pulse Gen Model [id number] Advisa DR MRI    Pulse Gen Serial Number [id number]    Clinic Name [facility]    Implantable Pulse Generator Type Implantable Pulse Generator    Implantable Pulse Generator Implant Date 85462703    Implantable Lead Manufacturer MERM    Implantable Lead Model 5076 CapSureFix Novus MRI SureScan    Implantable Lead Serial Number [id number]    Implantable Lead Implant Date 50093818    Implantable Lead Location Detail 1 UNKNOWN    Implantable Lead Location [id number]    Implantable Lead Manufacturer MERM    Implantable Lead Model 5076 CapSureFix Novus MRI SureScan    Implantable Lead Serial Number [id number]    Implantable Lead Implant Date 89381017    Implantable Lead Location Detail 1 UNKNOWN  Implantable  Lead Location [id number]    Lead Channel Setting Sensing Sensitivity 2.8 mV   Lead Channel Setting Pacing Amplitude 2 V   Lead Channel Setting Pacing Pulse Width 0.4 ms   Lead Channel Setting Pacing Amplitude 2.5 V   Lead Channel Impedance Value 399 ohm   Lead Channel Impedance Value 285 ohm   Lead Channel Sensing Intrinsic Amplitude 1.125 mV   Lead Channel Sensing Intrinsic Amplitude 1.125 mV   Lead Channel Pacing Threshold Amplitude 1 V   Lead Channel Pacing Threshold Pulse Width 0.4 ms   Lead Channel Impedance Value 399 ohm   Lead Channel Impedance Value 342 ohm   Lead Channel Sensing Intrinsic Amplitude 6.75 mV   Lead Channel Sensing Intrinsic Amplitude 6.75 mV   Lead Channel Pacing Threshold Amplitude 0.875 V   Lead Channel Pacing Threshold Pulse Width 0.4 ms   Battery Status OK    Battery Remaining Longevity 47 mo   Battery Voltage 2.99 V   Brady Statistic RA Percent Paced 0 %   Brady Statistic RV Percent Paced 74.57 %  CBC     Status: Abnormal   Collection Time: 06/21/20  3:25 PM  Result Value Ref Range   WBC 6.1 3.4 - 10.8 x10E3/uL   RBC 2.72 (LL) 3.77 - 5.28 x10E6/uL   Hemoglobin 7.0 (LL) 11.1 - 15.9 g/dL   Hematocrit 23.3 (L) 34.0 - 46.6 %   MCV 86 79 - 97 fL   MCH 25.7 (L) 26.6 - 33.0 pg   MCHC 30.0 (L) 31.5 - 35.7 g/dL   RDW 15.9 (H) 11.7 - 15.4 %   Platelets 247 150 - 450 H96Q2/WL  Basic Metabolic Panel (BMET)     Status: Abnormal   Collection Time: 06/21/20  3:25 PM  Result Value Ref Range   Glucose 120 (H) 65 - 99 mg/dL   BUN 25 10 - 36 mg/dL   Creatinine, Ser 1.26 (H) 0.57 - 1.00 mg/dL   GFR calc non Af Amer 37 (L) >59 mL/min/1.73   GFR calc Af Amer 43 (L) >59 mL/min/1.73    Comment: **In accordance with recommendations from the NKF-ASN Task force,**   [facility] is in the process of updating its eGFR calculation to the   2021 CKD-EPI creatinine equation that estimates kidney function   without a race variable.    BUN/Creatinine Ratio 20 12 - 28   Sodium 136  134 - 144 mmol/L   Potassium 3.9 3.5 - 5.2 mmol/L   Chloride 98 96 - 106 mmol/L   CO2 25 20 - 29 mmol/L   Calcium 8.1 (L) 8.7 - 10.3 mg/dL  CBC with Differential     Status: Abnormal   Collection Time: 06/22/20 11:13 AM  Result Value Ref Range   WBC 5.7 4.0 - 10.5 K/uL   RBC 2.87 (L) 3.87 - 5.11 MIL/uL   Hemoglobin 7.4 (L) 12.0 - 15.0 g/dL   HCT 24.5 (L) 36.0 - 46.0 %   MCV 85.4 80.0 - 100.0 fL   MCH 25.8 (L) 26.0 - 34.0 pg   MCHC 30.2 30.0 - 36.0 g/dL   RDW 16.6 (H) 11.5 - 15.5 %   Platelets 274 150 - 400 K/uL   nRBC 0.0 0.0 - 0.2 %   Neutrophils Relative % 76 %   Neutro Abs 4.4 1.7 - 7.7 K/uL   Lymphocytes Relative 11 %   Lymphs Abs 0.7 0.7 - 4.0 K/uL   Monocytes Relative 9 %   Monocytes Absolute 0.5  0.1 - 1.0 K/uL   Eosinophils Relative 2 %   Eosinophils Absolute 0.1 0.0 - 0.5 K/uL   Basophils Relative 1 %   Basophils Absolute 0.1 0.0 - 0.1 K/uL   Immature Granulocytes 1 %   Abs Immature Granulocytes 0.03 0.00 - 0.07 K/uL    Comment: Performed at [facility], [address] [place], [place] [address]  Basic metabolic panel     Status: Abnormal   Collection Time: 06/22/20 11:13 AM  Result Value Ref Range   Sodium 134 (L) 135 - 145 mmol/L   Potassium 3.7 3.5 - 5.1 mmol/L   Chloride 99 98 - 111 mmol/L   CO2 26 22 - 32 mmol/L   Glucose, Bld 111 (H) 70 - 99 mg/dL    Comment: Glucose reference range applies only to samples taken after fasting for at least 8 hours.   BUN 25 (H) 8 - 23 mg/dL   Creatinine, Ser 1.22 (H) 0.44 - 1.00 mg/dL   Calcium 8.3 (L) 8.9 - 10.3 mg/dL   GFR, Estimated 42 (L) >60 mL/min    Comment: (NOTE) Calculated using the CKD-EPI Creatinine Equation (2021)    Anion gap 9 5 - 15    Comment: Performed at [facility], [address], [place], [place] [address]  Hepatic function panel     Status: Abnormal   Collection Time: 06/22/20 11:13 AM  Result Value Ref Range   Total Protein 6.5 6.5 - 8.1 g/dL   Albumin 3.0 (L) 3.5 - 5.0 g/dL   AST 23 15 - 41  U/L   ALT 13 0 - 44 U/L   Alkaline Phosphatase 122 38 - 126 U/L   Total Bilirubin 0.5 0.3 - 1.2 mg/dL   Bilirubin, Direct 0.1 0.0 - 0.2 mg/dL   Indirect Bilirubin 0.4 0.3 - 0.9 mg/dL    Comment: Performed at [facility] [address], [place], [place] [address]  Type and screen [facility]     Status: None   Collection Time: 06/22/20 11:14 AM  Result Value Ref Range   ABO/RH(D) A POS    Antibody Screen NEG    Sample Expiration 06/25/2020,2359    Unit Number [id number]    Blood Component Type RED CELLS,LR    Unit division 00    Status of Unit ISSUED,FINAL    Transfusion Status OK TO TRANSFUSE    Crossmatch Result Compatible    Unit Number [id number]    Blood Component Type RED CELLS,LR    Unit division 00    Status of Unit ISSUED,FINAL    Transfusion Status OK TO TRANSFUSE    Crossmatch Result      Compatible Performed at [facility], [address], [place], [place] [address]   Prepare RBC (crossmatch)     Status: None   Collection Time: 06/22/20 11:14 AM  Result Value Ref Range   Order Confirmation      ORDER PROCESSED BY BLOOD BANK Performed at [facility], [address], [place], [place] [address]   BPAM RBC     Status: None   Collection Time: 06/22/20 11:14 AM  Result Value Ref Range   ISSUE DATE / TIME 166060045997    Blood Product Unit Number [id number]    PRODUCT CODE [id number]    Unit Type and Rh 6200    Blood Product Expiration Date 683729021115    ISSUE DATE / TIME 520802233612    Blood Product Unit Number [id number]    PRODUCT CODE [id number]  Unit Type and Rh 6200    Blood Product Expiration Date 706237628315   POC occult blood, ED     Status: Abnormal   Collection Time: 06/22/20  2:09 PM  Result Value Ref Range   Fecal Occult Bld POSITIVE (A) NEGATIVE  Resp Panel by RT-PCR (Flu A&B, Covid) Nasopharyngeal Swab     Status: None   Collection Time: 06/22/20  6:05 PM   Specimen: Nasopharyngeal Swab; Nasopharyngeal(NP) swabs in vial  transport medium  Result Value Ref Range   SARS Coronavirus 2 by RT PCR NEGATIVE NEGATIVE    Comment: (NOTE) SARS-CoV-2 target nucleic acids are NOT DETECTED.  The SARS-CoV-2 RNA is generally detectable in upper respiratory specimens during the acute phase of infection. The lowest concentration of SARS-CoV-2 viral copies this assay can detect is 138 copies/mL. A negative result does not preclude SARS-Cov-2 infection and should not be used as the sole basis for treatment or other patient management decisions. A negative result may occur with  improper specimen collection/handling, submission of specimen other than nasopharyngeal swab, presence of viral mutation(s) within the areas targeted by this assay, and inadequate number of viral copies(<138 copies/mL). A negative result must be combined with clinical observations, patient history, and epidemiological information. The expected result is Negative.  Fact Sheet for Patients:  EntrepreneurPulse.com.au  Fact Sheet for Healthcare Providers:  IncredibleEmployment.be  This test is no t yet approved or cleared by the [place] FDA and  has been authorized for detection and/or diagnosis of SARS-CoV-2 by FDA under an Emergency Use Authorization (EUA). This EUA will remain  in effect (meaning this test can be used) for the duration of the COVID-19 declaration under Section 564(b)(1) of the Act, 21 U.S.C.section 360bbb-3(b)(1), unless the authorization is terminated  or revoked sooner.       Influenza A by PCR NEGATIVE NEGATIVE   Influenza B by PCR NEGATIVE NEGATIVE    Comment: (NOTE) The Xpert Xpress SARS-CoV-2/FLU/RSV plus assay is intended as an aid in the diagnosis of influenza from Nasopharyngeal swab specimens and should not be used as a sole basis for treatment. Nasal washings and aspirates are unacceptable for Xpert Xpress SARS-CoV-2/FLU/RSV testing.  Fact Sheet for  Patients: EntrepreneurPulse.com.au  Fact Sheet for Healthcare Providers: IncredibleEmployment.be  This test is not yet approved or cleared by the [place] FDA and has been authorized for detection and/or diagnosis of SARS-CoV-2 by FDA under an Emergency Use Authorization (EUA). This EUA will remain in effect (meaning this test can be used) for the duration of the COVID-19 declaration under Section 564(b)(1) of the Act, 21 U.S.C. section 360bbb-3(b)(1), unless the authorization is terminated or revoked.  Performed at [facility], [address], [place], [place] [address]   CBC     Status: None   Collection Time: 06/23/20  4:46 AM  Result Value Ref Range   WBC 8.4 4.0 - 10.5 K/uL   RBC 4.50 3.87 - 5.11 MIL/uL   Hemoglobin 12.1 12.0 - 15.0 g/dL    Comment: POST TRANSFUSION SPECIMEN   HCT 37.9 36.0 - 46.0 %   MCV 84.2 80.0 - 100.0 fL   MCH 26.9 26.0 - 34.0 pg   MCHC 31.9 30.0 - 36.0 g/dL   RDW 15.3 11.5 - 15.5 %   Platelets 261 150 - 400 K/uL   nRBC 0.0 0.0 - 0.2 %    Comment: Performed at [facility], [address] [place], [place] [address]  Renal function panel     Status: Abnormal  Collection Time: 06/23/20  4:46 AM  Result Value Ref Range   Sodium 134 (L) 135 - 145 mmol/L   Potassium 3.4 (L) 3.5 - 5.1 mmol/L   Chloride 97 (L) 98 - 111 mmol/L   CO2 26 22 - 32 mmol/L   Glucose, Bld 117 (H) 70 - 99 mg/dL    Comment: Glucose reference range applies only to samples taken after fasting for at least 8 hours.   BUN 21 8 - 23 mg/dL   Creatinine, Ser 1.09 (H) 0.44 - 1.00 mg/dL   Calcium 8.8 (L) 8.9 - 10.3 mg/dL   Phosphorus 3.4 2.5 - 4.6 mg/dL   Albumin 3.3 (L) 3.5 - 5.0 g/dL   GFR, Estimated 48 (L) >60 mL/min    Comment: (NOTE) Calculated using the CKD-EPI Creatinine Equation (2021)    Anion gap 11 5 - 15    Comment: Performed at [facility], [address] [place], [place] [address]  Hemoglobin and hematocrit, blood     Status:  Abnormal   Collection Time: 06/23/20  8:20 AM  Result Value Ref Range   Hemoglobin 11.8 (L) 12.0 - 15.0 g/dL   HCT 37.2 36.0 - 46.0 %    Comment: Performed at [facility], [address] [place], [place] [address]  Hemoglobin, fingerstick     Status: Abnormal   Collection Time: 06/28/20  2:27 PM  Result Value Ref Range   Hemoglobin 11.0 (L) 11.1 - 15.9 g/dL  CBC with Differential/Platelet     Status: None   Collection Time: 06/28/20  2:57 PM  Result Value Ref Range   WBC 6.0 3.4 - 10.8 x10E3/uL   RBC 4.43 3.77 - 5.28 x10E6/uL   Hemoglobin 12.0 11.1 - 15.9 g/dL   Hematocrit 36.9 34.0 - 46.6 %   MCV 83 79 - 97 fL   MCH 27.1 26.6 - 33.0 pg   MCHC 32.5 31.5 - 35.7 g/dL   RDW 14.7 11.7 - 15.4 %   Platelets 266 150 - 450 x10E3/uL   Neutrophils 71 Not Estab. %   Lymphs 15 Not Estab. %   Monocytes 11 Not Estab. %   Eos 2 Not Estab. %   Basos 1 Not Estab. %   Neutrophils Absolute 4.2 1.4 - 7.0 x10E3/uL   Lymphocytes Absolute 0.9 0.7 - 3.1 x10E3/uL   Monocytes Absolute 0.6 0.1 - 0.9 x10E3/uL   EOS (ABSOLUTE) 0.1 0.0 - 0.4 x10E3/uL   Basophils Absolute 0.1 0.0 - 0.2 x10E3/uL   Immature Granulocytes 0 Not Estab. %   Immature Grans (Abs) 0.0 0.0 - 0.1 x10E3/uL  CMP14+EGFR     Status: Abnormal   Collection Time: 06/28/20  2:57 PM  Result Value Ref Range   Glucose 104 (H) 65 - 99 mg/dL   BUN 25 10 - 36 mg/dL   Creatinine, Ser 1.39 (H) 0.57 - 1.00 mg/dL   GFR calc non Af Amer 33 (L) >59 mL/min/1.73   GFR calc Af Amer 38 (L) >59 mL/min/1.73    Comment: **In accordance with recommendations from the NKF-ASN Task force,**   [facility] is in the process of updating its eGFR calculation to the   2021 CKD-EPI creatinine equation that estimates kidney function   without a race variable.    BUN/Creatinine Ratio 18 12 - 28   Sodium 137 134 - 144 mmol/L   Potassium 3.9 3.5 - 5.2 mmol/L   Chloride 95 (L) 96 - 106 mmol/L   CO2 27 20 - 29 mmol/L  Calcium 8.7 8.7 - 10.3 mg/dL   Total Protein 6.7  6.0 - 8.5 g/dL   Albumin 3.5 3.5 - 4.6 g/dL   Globulin, Total 3.2 1.5 - 4.5 g/dL   Albumin/Globulin Ratio 1.1 (L) 1.2 - 2.2   Bilirubin Total 0.5 0.0 - 1.2 mg/dL   Alkaline Phosphatase 168 (H) 44 - 121 IU/L    Comment:               **Please note reference interval change**   AST 21 0 - 40 IU/L   ALT 11 0 - 32 IU/L  POC occult blood, ED     Status: None   Collection Time: 07/05/20  8:48 AM  Result Value Ref Range   Fecal Occult Bld NEGATIVE NEGATIVE    Comment: PATIENT IDENTIFICATION ERROR. PLEASE DISREGARD RESULTS. ACCOUNT WILL BE CREDITED. Performed at [facility], [address], [place], [place] [address]   CBC with Differential/Platelet     Status: Abnormal   Collection Time: 07/05/20  8:58 AM  Result Value Ref Range   WBC 6.5 4.0 - 10.5 K/uL   RBC 3.69 (L) 3.87 - 5.11 MIL/uL   Hemoglobin 9.8 (L) 12.0 - 15.0 g/dL   HCT 33.0 (L) 36.0 - 46.0 %   MCV 89.4 80.0 - 100.0 fL   MCH 26.6 26.0 - 34.0 pg   MCHC 29.7 (L) 30.0 - 36.0 g/dL   RDW 16.5 (H) 11.5 - 15.5 %   Platelets 266 150 - 400 K/uL   nRBC 0.0 0.0 - 0.2 %   Neutrophils Relative % 71 %   Neutro Abs 4.7 1.7 - 7.7 K/uL   Lymphocytes Relative 14 %   Lymphs Abs 0.9 0.7 - 4.0 K/uL   Monocytes Relative 12 %   Monocytes Absolute 0.8 0.1 - 1.0 K/uL   Eosinophils Relative 1 %   Eosinophils Absolute 0.1 0.0 - 0.5 K/uL   Basophils Relative 1 %   Basophils Absolute 0.1 0.0 - 0.1 K/uL   Immature Granulocytes 1 %   Abs Immature Granulocytes 0.03 0.00 - 0.07 K/uL    Comment: Performed at [facility], [address], [place], [place] [address]  Comprehensive metabolic panel     Status: Abnormal   Collection Time: 07/05/20  8:58 AM  Result Value Ref Range   Sodium 136 135 - 145 mmol/L   Potassium 4.0 3.5 - 5.1 mmol/L   Chloride 99 98 - 111 mmol/L   CO2 28 22 - 32 mmol/L   Glucose, Bld 102 (H) 70 - 99 mg/dL    Comment: Glucose reference range applies only to samples taken after fasting for at least 8 hours.   BUN 28 (H) 8 - 23  mg/dL   Creatinine, Ser 1.39 (H) 0.44 - 1.00 mg/dL   Calcium 8.7 (L) 8.9 - 10.3 mg/dL   Total Protein 6.2 (L) 6.5 - 8.1 g/dL   Albumin 2.8 (L) 3.5 - 5.0 g/dL   AST 22 15 - 41 U/L   ALT 13 0 - 44 U/L   Alkaline Phosphatase 111 38 - 126 U/L   Total Bilirubin 0.6 0.3 - 1.2 mg/dL   GFR, Estimated 36 (L) >60 mL/min    Comment: (NOTE) Calculated using the CKD-EPI Creatinine Equation (2021)    Anion gap 9 5 - 15    Comment: Performed at [facility], [address] [place],  [address]  POC occult blood, ED     Status: Abnormal   Collection Time: 07/05/20 12:57 PM  Result Value Ref Range   Fecal Occult Bld POSITIVE (A) NEGATIVE  Ferritin     Status: None   Collection Time: 07/07/20 10:52 AM  Result Value Ref Range   Ferritin 20.7 10.0 - 291.0 ng/mL  IBC panel     Status: Abnormal   Collection Time: 07/07/20 10:52 AM  Result Value Ref Range   Iron 25 (L) 42 - 145 ug/dL   Transferrin 239.0 212.0 - 360.0 mg/dL   Saturation Ratios 7.5 (L) 20.0 - 50.0 %  CBC     Status: Abnormal   Collection Time: 07/07/20 10:52 AM  Result Value Ref Range   WBC 5.7 4.0 - 10.5 K/uL   RBC 3.55 (L) 3.87 - 5.11 Mil/uL   Platelets 293.0 150.0 - 400.0 K/uL   Hemoglobin 9.5 (L) 12.0 - 15.0 g/dL   HCT 29.2 (L) 36.0 - 46.0 %   MCV 82.1 78.0 - 100.0 fl   MCHC 32.5 30.0 - 36.0 g/dL   RDW 18.6 (H) 11.5 - 15.5 %    RADIOGRAPHIC STUDIES: I have personally reviewed the radiological images as listed and agreed with the findings in the report. No results found. I have independently interviewed and examined this patient.  I agree with HPI written by my nurse practitioner Wenda Low, FNP.  I have independently formulated my assessment and plan.  ASSESSMENT & PLAN:  1.  Normocytic to macrocytic anemia: -Likely secondary to CKD and GI bleeding -Received Feraheme in the past, in 2019. -EGD on 10/12/2019 showed small angiectasia with no bleeding found on the greater curvature of the stomach, status post APC.   Esophagus and duodenum was normal. - Eliquis has been discontinued indefinitely secondary to recurrent GI bleeding. -She has been hospitalized over the past 6 months for hematochezia and required blood and iron. -Had EGD and flexible sigmoidoscopy while hospitalized in Aug 2021 which showed a bleeding ulcer. -Previous work-up included r/o vitamin deficiencies-Copper, vitamin G28, and folic acid were normal. MMA elevated at 642. Hemolysis labs were negative.SPEP negative. LDH slightly elevated at 210.  -She received 2 doses of IV Feraheme on 01/01/2020 and 01/08/2020 and 1 B12 injection.  --Had an iron infusion while at [facility] in November 2021. -She is followed by gastroenterology and saw them on 07/05/2020.  They do not recommend any additional follow-up at this time. -Lab work from 07/05/2020 shows a hemoglobin of 9.8, ferritin 20.7, saturation ratio 7.5.  - She missed follow-up with [place] secondary to hospitalizations and rehab  2. atrial fibrillation: - Had a stroke in March 2021 while off anticoagulation secondary to GI bleed. -She was restarted on Eliquis but unfortunately has had recurrent GI bleed since. -She is currently not on anticoagulation.  3.  Hip fracture: -Patient fell in September 2021 and required ORIF -Required SNF placement and just returned home in early December 2021. - Continues home physical therapy  4. CKD: -Labs from 07/05/2020 show a creatinine of 1.39 with a BUN of 28.  Disposition: - Recommend 2 doses of IV Feraheme and monthly B12 injections -RTC in 8 weeks for follow-up with labs (CBC, CMP, iron, ferritin, vitamin B12)  I provided 20 minutes of non face-to-face telephone visit time during this encounter, and > 50% was spent counseling as documented under my assessment & plan.  All questions were answered. The patient knows to call the clinic with any problems, questions or concerns.    Jacquelin Hawking, NP 07/14/20 9:57 AM

## 2020-07-14 NOTE — Telephone Encounter (Signed)
Patient was able to get medication and has been taking.

## 2020-07-14 NOTE — Telephone Encounter (Signed)
PA denied states must get through her medicare part B. I have called daughter and asked her to call us back to discuss.

## 2020-07-15 ENCOUNTER — Encounter (HOSPITAL_COMMUNITY): Payer: Self-pay

## 2020-07-15 ENCOUNTER — Inpatient Hospital Stay (HOSPITAL_COMMUNITY): Payer: Medicare Other

## 2020-07-15 VITALS — BP 124/64 | HR 59 | Temp 96.9°F | Resp 18

## 2020-07-15 DIAGNOSIS — I129 Hypertensive chronic kidney disease with stage 1 through stage 4 chronic kidney disease, or unspecified chronic kidney disease: Secondary | ICD-10-CM | POA: Diagnosis not present

## 2020-07-15 DIAGNOSIS — D631 Anemia in chronic kidney disease: Secondary | ICD-10-CM | POA: Diagnosis not present

## 2020-07-15 DIAGNOSIS — N189 Chronic kidney disease, unspecified: Secondary | ICD-10-CM | POA: Diagnosis not present

## 2020-07-15 DIAGNOSIS — D500 Iron deficiency anemia secondary to blood loss (chronic): Secondary | ICD-10-CM

## 2020-07-15 DIAGNOSIS — Z79899 Other long term (current) drug therapy: Secondary | ICD-10-CM | POA: Diagnosis not present

## 2020-07-15 MED ORDER — SODIUM CHLORIDE 0.9 % IV SOLN: Freq: Once | INTRAVENOUS | Status: AC

## 2020-07-15 MED ORDER — CYANOCOBALAMIN 1000 MCG/ML IJ SOLN
1000.0000 ug | Freq: Once | INTRAMUSCULAR | Status: AC
  Administered 2020-07-15: 1000 ug via INTRAMUSCULAR
  Filled 2020-07-15: qty 1

## 2020-07-15 MED ORDER — LORATADINE 10 MG PO TABS
10.0000 mg | ORAL_TABLET | Freq: Once | ORAL | Status: AC
  Administered 2020-07-15: 10 mg via ORAL

## 2020-07-15 MED ORDER — ACETAMINOPHEN 325 MG PO TABS
650.0000 mg | ORAL_TABLET | Freq: Once | ORAL | Status: AC
  Administered 2020-07-15: 650 mg via ORAL

## 2020-07-15 MED ORDER — ACETAMINOPHEN 325 MG PO TABS
ORAL_TABLET | ORAL | Status: AC
  Filled 2020-07-15: qty 2

## 2020-07-15 MED ORDER — FAMOTIDINE 20 MG PO TABS
ORAL_TABLET | ORAL | Status: AC
  Filled 2020-07-15: qty 1

## 2020-07-15 MED ORDER — LORATADINE 10 MG PO TABS
ORAL_TABLET | ORAL | Status: AC
  Filled 2020-07-15: qty 1

## 2020-07-15 MED ORDER — FAMOTIDINE 20 MG PO TABS
20.0000 mg | ORAL_TABLET | Freq: Once | ORAL | Status: AC
  Administered 2020-07-15: 20 mg via ORAL

## 2020-07-15 MED ORDER — SODIUM CHLORIDE 0.9 % IV SOLN
510.0000 mg | Freq: Once | INTRAVENOUS | Status: AC
  Administered 2020-07-15: 510 mg via INTRAVENOUS
  Filled 2020-07-15: qty 510

## 2020-07-15 NOTE — Patient Instructions (Signed)
[facility]  Discharge Instructions:   _______________________________________________________________  Thank you for choosing [facility] to provide your oncology and hematology care.  To afford each patient quality time with our providers, please arrive at least 15 minutes before your scheduled appointment.  You need to re-schedule your appointment if you arrive 10 or more minutes late.  We strive to give you quality time with our providers, and arriving late affects you and other patients whose appointments are after yours.  Also, if you no show three or more times for appointments you may be dismissed from the clinic.  Again, thank you for choosing [facility]. Our hope is that these requests will allow you access to exceptional care and in a timely manner. _______________________________________________________________  If you have questions after your visit, please contact our office at [phone] between the hours of 8:30 a.m. and 5:00 p.m. Voicemails left after 4:30 p.m. will not be returned until the following business day. _______________________________________________________________  For prescription refill requests, have your pharmacy contact our office. _______________________________________________________________  Recommendations made by the consultant and any test results will be sent to your referring physician. _______________________________________________________________ 

## 2020-07-15 NOTE — Progress Notes (Signed)
Tolerated iron infusion well today without incidence.  Discharged via wheelchair in stable condition.  Vital signs stable prior to discharge.

## 2020-07-18 ENCOUNTER — Ambulatory Visit: Payer: Medicare Other | Admitting: Nurse Practitioner

## 2020-07-20 DIAGNOSIS — I69351 Hemiplegia and hemiparesis following cerebral infarction affecting right dominant side: Secondary | ICD-10-CM | POA: Diagnosis not present

## 2020-07-20 DIAGNOSIS — J449 Chronic obstructive pulmonary disease, unspecified: Secondary | ICD-10-CM | POA: Diagnosis not present

## 2020-07-20 DIAGNOSIS — G309 Alzheimer's disease, unspecified: Secondary | ICD-10-CM | POA: Diagnosis not present

## 2020-07-20 DIAGNOSIS — F0150 Vascular dementia without behavioral disturbance: Secondary | ICD-10-CM | POA: Diagnosis not present

## 2020-07-20 DIAGNOSIS — S72002D Fracture of unspecified part of neck of left femur, subsequent encounter for closed fracture with routine healing: Secondary | ICD-10-CM | POA: Diagnosis not present

## 2020-07-20 DIAGNOSIS — F0280 Dementia in other diseases classified elsewhere without behavioral disturbance: Secondary | ICD-10-CM | POA: Diagnosis not present

## 2020-07-21 DIAGNOSIS — S72002D Fracture of unspecified part of neck of left femur, subsequent encounter for closed fracture with routine healing: Secondary | ICD-10-CM | POA: Diagnosis not present

## 2020-07-21 DIAGNOSIS — J449 Chronic obstructive pulmonary disease, unspecified: Secondary | ICD-10-CM | POA: Diagnosis not present

## 2020-07-21 DIAGNOSIS — G309 Alzheimer's disease, unspecified: Secondary | ICD-10-CM | POA: Diagnosis not present

## 2020-07-21 DIAGNOSIS — F0150 Vascular dementia without behavioral disturbance: Secondary | ICD-10-CM | POA: Diagnosis not present

## 2020-07-21 DIAGNOSIS — F0280 Dementia in other diseases classified elsewhere without behavioral disturbance: Secondary | ICD-10-CM | POA: Diagnosis not present

## 2020-07-21 DIAGNOSIS — I69351 Hemiplegia and hemiparesis following cerebral infarction affecting right dominant side: Secondary | ICD-10-CM | POA: Diagnosis not present

## 2020-07-22 ENCOUNTER — Other Ambulatory Visit: Payer: Self-pay

## 2020-07-22 ENCOUNTER — Inpatient Hospital Stay (HOSPITAL_COMMUNITY): Payer: Medicare Other

## 2020-07-22 VITALS — BP 100/50 | HR 63 | Temp 97.0°F | Resp 18

## 2020-07-22 DIAGNOSIS — D631 Anemia in chronic kidney disease: Secondary | ICD-10-CM | POA: Diagnosis not present

## 2020-07-22 DIAGNOSIS — N189 Chronic kidney disease, unspecified: Secondary | ICD-10-CM | POA: Diagnosis not present

## 2020-07-22 DIAGNOSIS — Z79899 Other long term (current) drug therapy: Secondary | ICD-10-CM | POA: Diagnosis not present

## 2020-07-22 DIAGNOSIS — I129 Hypertensive chronic kidney disease with stage 1 through stage 4 chronic kidney disease, or unspecified chronic kidney disease: Secondary | ICD-10-CM | POA: Diagnosis not present

## 2020-07-22 DIAGNOSIS — D500 Iron deficiency anemia secondary to blood loss (chronic): Secondary | ICD-10-CM

## 2020-07-22 MED ORDER — SODIUM CHLORIDE 0.9 % IV SOLN
510.0000 mg | Freq: Once | INTRAVENOUS | Status: AC
  Administered 2020-07-22: 510 mg via INTRAVENOUS
  Filled 2020-07-22: qty 510

## 2020-07-22 MED ORDER — LORATADINE 10 MG PO TABS
10.0000 mg | ORAL_TABLET | Freq: Once | ORAL | Status: AC
  Administered 2020-07-22: 10 mg via ORAL
  Filled 2020-07-22: qty 1

## 2020-07-22 MED ORDER — FAMOTIDINE 20 MG PO TABS
20.0000 mg | ORAL_TABLET | Freq: Once | ORAL | Status: AC
  Administered 2020-07-22: 20 mg via ORAL
  Filled 2020-07-22: qty 1

## 2020-07-22 MED ORDER — SODIUM CHLORIDE 0.9 % IV SOLN: Freq: Once | INTRAVENOUS | Status: AC

## 2020-07-22 MED ORDER — ACETAMINOPHEN 325 MG PO TABS
650.0000 mg | ORAL_TABLET | Freq: Once | ORAL | Status: AC
  Administered 2020-07-22: 650 mg via ORAL
  Filled 2020-07-22: qty 2

## 2020-07-22 NOTE — Patient Instructions (Signed)
[facility] Discharge Instructions for Patients Receiving Chemotherapy  Today you received the following chemotherapy agents   To help prevent nausea and vomiting after your treatment, we encourage you to take your nausea medication   If you develop nausea and vomiting that is not controlled by your nausea medication, call the clinic.   BELOW ARE SYMPTOMS THAT SHOULD BE REPORTED IMMEDIATELY:  *FEVER GREATER THAN 100.5 F  *CHILLS WITH OR WITHOUT FEVER  NAUSEA AND VOMITING THAT IS NOT CONTROLLED WITH YOUR NAUSEA MEDICATION  *UNUSUAL SHORTNESS OF BREATH  *UNUSUAL BRUISING OR BLEEDING  TENDERNESS IN MOUTH AND THROAT WITH OR WITHOUT PRESENCE OF ULCERS  *URINARY PROBLEMS  *BOWEL PROBLEMS  UNUSUAL RASH Items with * indicate a potential emergency and should be followed up as soon as possible.  Feel free to call the clinic should you have any questions or concerns. The clinic phone number is [phone].  Please show the CHEMO ALERT CARD at check-in to the Emergency Department and triage nurse.   

## 2020-07-22 NOTE — Progress Notes (Signed)
Patient present s today for Feraheme infusion.  Vital signs WNL.  No new complaints since last infusion.  Treatment given today per MD orders.  Tolerated infusion without adverse affects.  Vital signs stable.  No complaints at this time.  Discharge from clinic via wheelchair in stable condition.  Alert and oriented X 3.  Follow up with [facility] as scheduled.

## 2020-07-25 DIAGNOSIS — S72002D Fracture of unspecified part of neck of left femur, subsequent encounter for closed fracture with routine healing: Secondary | ICD-10-CM | POA: Diagnosis not present

## 2020-07-25 DIAGNOSIS — G309 Alzheimer's disease, unspecified: Secondary | ICD-10-CM | POA: Diagnosis not present

## 2020-07-25 DIAGNOSIS — F0150 Vascular dementia without behavioral disturbance: Secondary | ICD-10-CM | POA: Diagnosis not present

## 2020-07-25 DIAGNOSIS — I69351 Hemiplegia and hemiparesis following cerebral infarction affecting right dominant side: Secondary | ICD-10-CM | POA: Diagnosis not present

## 2020-07-25 DIAGNOSIS — F0280 Dementia in other diseases classified elsewhere without behavioral disturbance: Secondary | ICD-10-CM | POA: Diagnosis not present

## 2020-07-25 DIAGNOSIS — J449 Chronic obstructive pulmonary disease, unspecified: Secondary | ICD-10-CM | POA: Diagnosis not present

## 2020-07-26 ENCOUNTER — Encounter: Payer: Self-pay | Admitting: Adult Health

## 2020-07-26 ENCOUNTER — Other Ambulatory Visit: Payer: Self-pay

## 2020-07-26 ENCOUNTER — Ambulatory Visit (INDEPENDENT_AMBULATORY_CARE_PROVIDER_SITE_OTHER): Payer: Medicare Other | Admitting: Adult Health

## 2020-07-26 VITALS — BP 109/61 | HR 63 | Ht 61.0 in

## 2020-07-26 DIAGNOSIS — F0280 Dementia in other diseases classified elsewhere without behavioral disturbance: Secondary | ICD-10-CM | POA: Diagnosis not present

## 2020-07-26 DIAGNOSIS — G309 Alzheimer's disease, unspecified: Secondary | ICD-10-CM | POA: Diagnosis not present

## 2020-07-26 DIAGNOSIS — F0150 Vascular dementia without behavioral disturbance: Secondary | ICD-10-CM

## 2020-07-26 DIAGNOSIS — I63511 Cerebral infarction due to unspecified occlusion or stenosis of right middle cerebral artery: Secondary | ICD-10-CM

## 2020-07-26 MED ORDER — MEMANTINE HCL 10 MG PO TABS: 10.0000 mg | ORAL_TABLET | Freq: Two times a day (BID) | ORAL | 3 refills | Status: DC

## 2020-07-26 NOTE — Progress Notes (Signed)
I agree with the above plan 

## 2020-07-26 NOTE — Progress Notes (Signed)
[facility] [address] [place]. [place] [address] [phone]       OFFICE FOLLOW-UP NOTE  [name] Date of Birth:  02-19-1927 Medical Record Number:  [id number]    Referring physician Dr  Crystal Cooper  Reason for referral : dementia and stroke    Chief Complaint  Patient presents with  . Follow-up    Treatment Room with daughter ([place]) Pt is well, broke hip in Sept.     HPI:  Today, 07/26/2020, Crystal Cooper returns for 85-month stroke and dementia follow-up.  Unfortunately, since prior visit she has had multiple ED admissions for GI bleeds as well as left hip fracture s/p OTIF with gamma implant 03/09/2020.  History of A. fib but due to recurrent GI bleeds, she is not an AC candidate and routinely follows with cardiology.  Overall stable from stroke standpoint without new stroke/TIA symptoms.  Dementia has been relatively stable since prior visit.  No behavioral concerns.  She continues to live in her own home but does have 24/7 caregivers and her daughter will stay with her 3-4 nights per week.  MMSE today 20/30.  Remains on Namenda 10 mg twice daily tolerating without side effects.  Blood pressure today 109/61.  No further concerns at this time.     History provided for reference purposes only Update 01/04/2020 Dr. Leonie Cooper: She returns for follow-up after last visit 85 months ago.  She is accompanied by daughter.  She is tolerating Namenda well without any significant dizziness sleepiness or other side effects and in fact has noticed some cognitive improvement.  She is able to carry out conversations better and remember recent information.  She has 24/7 caregiver support at home.  She is able to ambulate with a cane and is fairly careful and has had no falls or injuries.  She remains on Eliquis which is tolerating well without bleeding or other side effects.  She does have some occasional delusions and hallucinations but there she can easily be redirected.  She is  sleeping all right.  She has had no unsafe behavior.  She does do still a lot of word searches.  She has no new complaints.  She did undergo MRI scan of the brain on 12/03/2019 which showed no acute abnormality but showed old bilateral cerebellar and right thalamic and corona radiata infarcts.  EEG on 12/21/2019 was normal.  TSH and free T4 on 12/31/2019 were normal.  Initial visit 11/11/2019 Dr. Phillips Climes. Cooper is an 87 year old Caucasian lady seen today for initial office consultation visit following significant new cognitive changes following her recent stroke.  She is accompanied by her daughter.  History is obtained from them, review of electronic medical records and I personally reviewed available imaging films in PACS.  She has a past medical history of SVT s/p ablation, mitral valve prolapse, hypertension, atrial fibrillation on chronic anticoagulation who had developed GI hemorrhage and hence anticoagulation was stopped.  She will was found on the floor unresponsive.  She was considered outside the code stroke window.  CT scan showed no acute abnormality but MRI scan showed right thalamocapsular junction infarct with punctate infarcts also involving the right insula, right parietal and occipital lobes with multiple old infarcts involving the basal ganglia, right thalamus, cerebellum bilaterally.  Carotid ultrasound showed no significant bilateral extracranial stenosis.  2D echo done in January 2021 showed normal ejection fraction.  Patient also had previous changes of transaortic valve repair.  LDL cholesterol was 83 mg percent and hemoglobin A1c  was 5.3.  Patient had been off anticoagulation due to multiple embolic strokes after careful discussion of risk benefits of anticoagulation and bleeding and discussion with patient and daughter she will was recommended to go back on Eliquis.  The daughter however informs me that the patient has not been taking Eliquis as they were waiting on seeing the primary care  physician on the have not yet seen.  However the daughter's main complaint today is that patient had significant cognitive worsening.  She is has increasing confusion and agitation with caregivers as well as family members.  She often does not follow directions and when caregivers are not nearby she on one occasion went to the mailbox and on another occasion got a screwdriver to try to open the door and it was locked.  She often refuses bathing and self-care.  On Mother's Day she had to have children and grandchildren and later on asked where they were going in did not remember them having, and wished her.  She feels straight up her coffee and eats the food called.  She is seeing people in the home who were not there and some of them are even deceased.  At one visit she wore a double panties and double depends and double pads.  The daughter feels that she is clearly had major cognitive decline since the recent strokes.  She has not been evaluated for reversible causes of cognitive impairment and has not been tried on Aricept or Namenda.  The daughter is not sure whether she had some cognitive impairment even before her recent strokes.  Patient lives by herself but the daughter lives right next door and helps her out with most activities.  There is no prior history of seizures.  There is no family history of dementia.  ROS:   14 system review of systems is positive for memory loss, confusion, hallucinations, disorientation, gait difficulty getting lost and all other systems negative  PMH:  Past Medical History:  Diagnosis Date  . AKI (acute kidney injury) ([place])   . Anemia    years ago  . Aortic stenosis   . Arthritis   . Asthma   . Atrial fibrillation ([place])   . Blood transfusion without reported diagnosis   . CHF (congestive heart failure) ([place]) 11/2014  . CKD (chronic kidney disease)    had many uti this year   . Dementia ([place])   . Family history of adverse reaction to anesthesia    2 daughters  would have N/V  . Gastric AVM   . GERD (gastroesophageal reflux disease)    was seen by Gi for bleeding ulcer which was cauterized   . GI bleed   . Glaucoma   . Hearing loss   . Heart murmur    Rheumatic fever whe she was young,  sees Dr. Stanford Breed in [place]  . HTN (hypertension)   . Hypokalemia   . Myocardial infarction ([facility])    in her 65 when she had two heart attacks   . Pneumonia    stayed from friday to sunday in august  . Presence of permanent cardiac pacemaker    placed about 4 years ago  . Prolapsing mitral leaflet syndrome   . Shortness of breath dyspnea    with exertion  . Stroke ([facility])   . SVT (supraventricular tachycardia) ([facility])    S/P ablation of AVNRT in 2003    Social History:  Social History   Socioeconomic History  . Marital status: Widowed  Spouse name: Not on file  . Number of children: 3  . Years of education: Not on file  . Highest education level: 12th grade  Occupational History  . Occupation: Retired  Tobacco Use  . Smoking status: Never Smoker  . Smokeless tobacco: Never Used  Vaping Use  . Vaping Use: Never used  Substance and Sexual Activity  . Alcohol use: No    Alcohol/week: 0.0 standard drinks  . Drug use: No  . Sexual activity: Not Currently  Other Topics Concern  . Not on file  Social History Narrative  . Not on file   Social Determinants of Health   Financial Resource Strain: Low Risk   . Difficulty of Paying Living Expenses: Not hard at all  Food Insecurity: No Food Insecurity  . Worried About Charity fundraiser in the Last Year: Never true  . Ran Out of Food in the Last Year: Never true  Transportation Needs: Not on file  Physical Activity: Inactive  . Days of Exercise per Week: 0 days  . Minutes of Exercise per Session: 0 min  Stress: No Stress Concern Present  . Feeling of Stress : Not at all  Social Connections: Moderately Integrated  . Frequency of Communication with Friends and Family: More than three times  a week  . Frequency of Social Gatherings with Friends and Family: More than three times a week  . Attends Religious Services: More than 4 times per year  . Active Member of Clubs or Organizations: Yes  . Attends Archivist Meetings: More than 4 times per year  . Marital Status: Widowed  Intimate Partner Violence: Not At Risk  . Fear of Current or Ex-Partner: No  . Emotionally Abused: No  . Physically Abused: No  . Sexually Abused: No    Medications:   Current Outpatient Medications on File Prior to Visit  Medication Sig Dispense Refill  . albuterol (PROAIR HFA) 108 (90 Base) MCG/ACT inhaler Inhale 2 puffs into the lungs every 4 (four) hours as needed for wheezing or shortness of breath. 18 g 11  . Biotin 10 MG CAPS Take 10 mg by mouth daily.     . Budeson-Glycopyrrol-Formoterol (BREZTRI AEROSPHERE) 160-9-4.8 MCG/ACT AERO Inhale 2 Inhalers into the lungs in the morning and at bedtime. 5.9 g 1  . Calcium Carb-Cholecalciferol (CALTRATE 600+D3) 600-800 MG-UNIT TABS Take 1 tablet by mouth daily.    [name] docusate sodium (COLACE) 100 MG capsule Take 1 capsule (100 mg total) by mouth 2 (two) times daily.    [name] 25 MCG tablet Take 1 tablet by mouth once daily 30 tablet 7  . famotidine (PEPCID) 20 MG tablet Take 1 tablet (20 mg total) by mouth at bedtime. 90 tablet 1  . folic acid (FOLVITE) 1 MG tablet Take 1 tablet (1 mg total) by mouth daily. 30 tablet 6  . furosemide (LASIX) 20 MG tablet Take 4 tablets (80 mg total) by mouth daily. 270 tablet 3  . iron polysaccharides (NIFEREX) 150 MG capsule Take 1 capsule (150 mg total) by mouth daily. 30 capsule 3  . levalbuterol (XOPENEX) 1.25 MG/3ML nebulizer solution Take 1.25 mg by nebulization every 4 (four) hours as needed for wheezing. Dx J45.909 72 mL 12  . memantine (NAMENDA) 10 MG tablet Take 1 tablet (10 mg total) by mouth 2 (two) times daily. 180 tablet 1  . metoprolol tartrate (LOPRESSOR) 25 MG tablet Take 1 tablet (25 mg total) by  mouth 2 (two) times daily. [place]  tablet 3  . pantoprazole (PROTONIX) 40 MG tablet Take 1 tablet by mouth twice daily 60 tablet 2  . potassium chloride SA (KLOR-CON) 10 MEQ tablet Take 2 tablets (20 mEq total) by mouth 2 (two) times daily. (Patient taking differently: Take 10 mEq by mouth 2 (two) times daily.) 60 tablet 2   No current facility-administered medications on file prior to visit.    Allergies:   Allergies  Allergen Reactions  . Hctz [Hydrochlorothiazide] Other (See Comments)    Pt was ill and this affected her kidneys   . Aspirin Other (See Comments)    Cardiologist said the patient is to not take this  . Codeine Other (See Comments)    Made the patient feel ill, has not had any problems since 1977    Physical Exam Today's Vitals   07/26/20 0946  BP: 109/61  Pulse: 63  Height: 5\' 1"  (1.549 m)   Body mass index is 22.56 kg/m.   General: well developed, well nourished very pleasant elderly Caucasian lady, seated, in no evident distress.   Head: head normocephalic and atraumatic.  Neck: supple with no carotid or supraclavicular bruits Cardiovascular: regular rate and rhythm, no murmurs Musculoskeletal: no deformity Skin:  no rash/petichiae Vascular:  Normal pulses all extremities  Neurologic Exam Mental Status: Awake and fully alert. Recent and remote memory poor.  Attention span, concentration and fund of knowledge diminished with daughter providing majority of history.  Mood and affect appropriate and cooperative with exam.  MMSE - Mini Mental State Exam 07/26/2020 02/05/2018  Orientation to time 2 5  Orientation to Place 2 5  Registration 3 3  Attention/ Calculation 5 5  Recall 1 3  Language- name 2 objects 2 2  Language- repeat 0 0  Language- follow 3 step command 3 3  Language- read & follow direction 1 1  Write a sentence 1 1  Copy design 0 1  Total score 20 29    Cranial Nerves: pupils equal, briskly reactive to light. Extraocular movements full without  nystagmus. Visual fields full to confrontation. Hearing significantly diminished bilaterally despite hearing aids.  Facial sensation intact. Face, tongue, palate moves normally and symmetrically.  Motor: Normal bulk and tone. Normal strength in all tested extremity muscles.  Diminished fine finger movements on the left.  Orbits right over left upper extremity.  Trace weakness of left grip. Sensory.: intact to touch ,pinprick .position and vibratory sensation.  Coordination: Rapid alternating movements normal in all extremities. Finger-to-nose and heel-to-shin performed accurately bilaterally. Gait and Station: Deferred as RW not present Reflexes: 1+ and symmetric. Toes downgoing.      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  She has shown some response to Namenda.  Vascular risk factors include A. 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 Right MCA stroke: Discussed consideration of initiating aspirin 81 mg daily for secondary stroke prevention and she has not an AC candidate with history of A. fib due to recurrent GI bleeds.  Per daughter, cardiology advised not to use aspirin or any blood thinning medications due to GI bleeds.  Request consideration in the future by cardiology and GI of initiating aspirin once stabilized from  GI bleeds for secondary stroke prevention.  Discussed secondary stroke prevention measures and importance of close PCP f/u for aggressive stroke risk factor management including HTN with BP goal<130/90 2. Dementia, mixed type of vascular and Alzheimer's: Overall stable without behavioral concerns.  Continue  Namenda 10 mg twice daily -refill provided 3. Atrial fibrillation: No AC due to GI bleed 09/2019, 01/2020, 05/2020 and 06/2020.  Routinely follows with cardiology    Follow-up in 1 year or call earlier if needed  CC:  [facility] provider: Dr. Sharlene Motts, [name], MD   I spent 30 minutes of face-to-face and non-face-to-face time with patient and daughter.  This included previsit chart review, lab review, study review, order entry, electronic health record documentation, patient and daughter education and discussion regarding history of prior stroke, dementia with completion and review of MMSE, importance of managing stroke risk factors and answered all other questions to patient and daughter satisfaction  Frann Rider, AGNP-BC  [facility] [address] [place], [place] [address]  Phone [phone] Fax [phone] Note: This document was prepared with digital dictation and possible smart phrase technology. Any transcriptional errors that result from this process are unintentional.

## 2020-07-26 NOTE — Patient Instructions (Addendum)
Your Plan:  Continue Namenda 10 mg twice daily -refill provided    Follow-up in 1 year or call earlier if needed     Thank you for coming to see [place] at [facility]. I hope we have been able to provide you high quality care today.  You may receive a patient satisfaction survey over the next few weeks. We would appreciate your feedback and comments so that we may continue to improve ourselves and the health of our patients.     Dementia Caregiver Guide Dementia is a term used to describe a number of symptoms that affect memory and thinking. The most common symptoms include:  Memory loss.  Trouble with language and communication.  Trouble concentrating.  Poor judgment and problems with reasoning.  Wandering from home or public places.  Extreme anxiety or depression.  Being suspicious or having angry outbursts and accusations.  Child-like behavior and language. Dementia can be frightening and confusing. And taking care of someone with dementia can be challenging. This guide provides tips to help you when providing care for a person with dementia. How to help manage lifestyle changes Dementia usually gets worse slowly over time. In the early stages, people with dementia can stay independent and safe with some help. In later stages, they need help with daily tasks such as dressing, grooming, and using the bathroom. There are actions you can take to help a person manage his or her life while living with this condition. Communicating  When the person is talking or seems frustrated, make eye contact and hold the person's hand.  Ask specific questions that need yes or no answers.  Use simple words, short sentences, and a calm voice. Only give one direction at a time.  When offering choices, limit the person to just one or two.  Avoid correcting the person in a negative way.  If the person is struggling to find the right words, gently try to help him or  her. Preventing injury  Keep floors clear of clutter. Remove rugs, magazine racks, and floor lamps.  Keep hallways well lit, especially at night.  Put a handrail and nonslip mat in the bathtub or shower.  Put childproof locks on cabinets that contain dangerous items, such as medicines, alcohol, guns, toxic cleaning items, sharp tools or utensils, matches, and lighters.  For doors to the outside of the house, put the locks in places where the person cannot see or reach them easily. This will help ensure that the person does not wander out of the house and get lost.  Be prepared for emergencies. Keep a list of emergency phone numbers and addresses in a convenient area.  Remove car keys and lock garage doors so that the person does not try to get in the car and drive.  Have the person wear a bracelet that tracks locations and identifies the person as having memory problems. This should be worn at all times for safety.   Helping with daily life  Keep the person on track with his or her routine.  Try to identify areas where the person may need help.  Be supportive, patient, calm, and encouraging.  Gently remind the person that adjusting to changes takes time.  Help with the tasks that the person has asked for help with.  Keep the person involved in daily tasks and decisions as much as possible.  Encourage conversation, but try not to get frustrated if the person struggles to find words or does not seem to  appreciate your help.   How to recognize stress Look for signs of stress in yourself and in the person you are caring for. If you notice signs of stress, take steps to manage it. Symptoms of stress include:  Feeling anxious, irritable, frustrated, or angry.  Denying that the person has dementia or that his or her symptoms will not improve.  Feeling depressed, hopeless, or unappreciated.  Difficulty sleeping.  Difficulty concentrating.  Developing stress-related health  problems.  Feeling like you have too little time for your own life. Follow these instructions at home: Take care of your health Make sure that you and the person you are caring for:  Get regular sleep.  Exercise regularly.  Eat regular, nutritious meals.  Take over-the-counter and prescription medicines only as told by your health care providers.  Drink enough fluid to keep your urine pale yellow.  Attend all scheduled health care appointments.   General instructions  Join a support group with others who are caregivers.  Ask about respite care resources. Respite care can provide short-term care for the person so that you can have a regular break from the stress of caregiving.  Consider any safety risks and take steps to avoid them.  Organize medicines in a pill box for each day of the week.  Create a plan to handle any legal or financial matters. Get legal or financial advice if needed.  Keep a calendar in a central location to remind the person of appointments or other activities. Where to find support: Many individuals and organizations offer support. These include:  Support groups for people with dementia.  Support groups for caregivers.  Counselors or therapists.  Home health care services.  Adult day care centers. Where to find more information  Centers for Disease Control and Prevention: http://www.wolf.info/  Alzheimer's Association: [employer]Mile.co.nz  Family Caregiver Alliance: www.caregiver.[place]: www.alzfdn.org Contact a health care provider if:  The person's health is rapidly getting worse.  You are no longer able to care for the person.  Caring for the person is affecting your physical and emotional health.  You are feeling depressed or anxious about caring for the person. Get help right away if:  The person threatens himself or herself, you, or anyone else.  You feel depressed or sad, or feel that you want to harm  yourself. If you ever feel like your loved one may hurt himself or herself or others, or if he or she shares thoughts about taking his or her own life, get help right away. You can go to your nearest emergency department or:  Call your local emergency services (911 in the [place]  Call a suicide crisis helpline, such as the [place] at 407-723-7682. This is open 24 hours a day in the [place]  Text the Crisis Text Line at (518) 765-6109 (in the [place].). Summary  Dementia is a term used to describe a number of symptoms that affect memory and thinking.  Dementia usually gets worse slowly over time.  Take steps to reduce the person's risk of injury and to plan for future care.  Caregivers need support, relief from caregiving, and time for their own lives. This information is not intended to replace advice given to you by your health care provider. Make sure you discuss any questions you have with your health care provider. Document Revised: 10/26/2019 Document Reviewed: 10/26/2019 Elsevier Patient Education  2021 [place].   Vascular Dementia Dementia is a condition in which a person  has problems with thinking, memory, and behavior that are severe enough to interfere with daily life. Vascular dementia is a type of dementia. It results from brain damage that is caused by the brain not getting enough blood. This condition may also be called vascular cognitive impairment. What are the causes? Vascular dementia is caused by conditions that reduce blood flow to the brain. Common causes of this condition include:  Multiple small strokes. These may happen without symptoms (silent stroke).  Major stroke.  Damage to small blood vessels in the brain (cerebral small vessel disease). What increases the risk? The following factors may make you more likely to develop this condition:  Having had a stroke.  Having high blood pressure (hypertension) or high cholesterol.  Having  a disease that affects the heart or blood vessels.  Smoking.  Not being active.  Being over age 57.  Having any of these conditions: ? Diabetes. ? Metabolic syndrome. ? Obesity. ? Depression. ? A genetic condition that leads to stroke, such as CADASIL (cerebral autosomal dominant arteriopathy with subcortical infarcts and leukoencephalopathy). What are the signs or symptoms? Symptoms of vascular dementia can vary from one person to another. Symptoms may be mild or severe depending on the amount of damage and which parts of the brain have been affected. Symptoms may begin suddenly or may develop slowly. Mental symptoms may include:  Confusion and memory problems.  Poor attention and concentration.  Trouble understanding speech.  Depression.  Personality changes.  Trouble recognizing familiar people.  Agitation or aggression.  Paranoia or false beliefs (delusions).  Hallucinations. These involve seeing, hearing, tasting, smelling, or feeling things that are not real. Physical symptoms may include:  Weakness.  Poor balance.  Loss of bladder or bowel control (incontinence).  Unsteady walking (gait).  Speaking problems. Behavioral symptoms may include:  Getting lost in familiar places.  Problems with planning and judgment.  Trouble following instructions.  Social problems.  Emotional outbursts.  Trouble with daily activities and self-care.  Problems handling money. Symptoms may remain stable, or they may get worse over time. Symptoms of vascular dementia may be similar to those of Alzheimer's disease. The two conditions can occur together (mixed dementia). How is this diagnosed? This condition may be diagnosed based on:  Your medical history and a physical exam.  Symptoms or changes that are reported by friends and family.  Lab tests or other tests that check brain and nervous system function. Tests may include: ? Blood tests. ? Brain imaging  tests. ? Tests of movement, speech, and other daily activities (neurological exam). ? Tests of memory, thinking, and problem-solving (neuropsychological or neurocognitive testing). There is not a specific test to diagnose vascular dementia. Diagnosis may involve several specialists. These may include:  A health care provider who specializes in the brain and nervous system (neurologist).  A health care provider who specializes in understanding how problems in the brain can alter behavior and cognitive function (neuropsychologist). How is this treated? There is no cure for vascular dementia. Brain damage that has already occurred cannot be reversed. Treatment depends on:  How severe the condition is.  Which parts of your brain have been affected.  Your overall health. Treatment measures aim to:  Treat the underlying cause of vascular dementia and manage risk factors. This may include: ? Controlling blood pressure or lowering cholesterol. ? Treating diabetes. ? Making lifestyle changes, such as quitting smoking or losing weight.  Manage symptoms.  Prevent further brain damage.  Improve the person's health  and quality of life. Treatment for dementia may involve a team of health care providers, including:  A neurologist.  A provider who specializes in disorders of the mind (psychiatrist).  A provider who specializes in helping people learn daily living skills (occupational therapist).  A provider who focuses on speech and language changes (Electrical engineer).  A heart specialist (cardiologist).  A provider who helps people learn how to manage physical changes, such as movement and walking (exercise physiologist or physical therapist). Follow these instructions at home: Medicines  Take over-the-counter and prescription medicines only as told by your health care provider.  Use a pill organizer or pill reminder to help you manage your medicines. Lifestyle  Do not use any  products that contain nicotine or tobacco, such as cigarettes, e-cigarettes, and chewing tobacco. If you need help quitting, ask your health care provider.  Eat a healthy, balanced diet.  Maintain a healthy weight, or lose weight if needed.  Be physically active as told by your health care provider. General instructions  Work with your health care provider to determine what you need help with and what your safety needs are.  Follow the health care provider's instructions for treating the condition that caused the dementia.  Keep all follow-up visits. This is important. If you are the caregiver: People with vascular dementia may need regular help at home or daily care from a family member or home health care worker. Home care for a person with vascular dementia depends on what caused the condition and how severe the symptoms are. General guidelines for caregivers include:  Help the person with dementia remember people, appointments, and daily activities.  Help the person with dementia manage his or her medicines.  Help family and friends learn about ways to communicate with the person with dementia.  Create a safe living space to reduce the risk of injury or falls.  Find a support group to help caregivers and family cope with the effects of dementia.   Where to find more information  [employer] of Neurological Disorders and Stroke: MasterBoxes.it Contact a health care provider if:  New behavioral problems develop.  Problems with swallowing develop.  Confusion gets worse.  Sleepiness gets worse. Get help right away if:  Loss of consciousness occurs.  There is a sudden loss of speech, balance, or thinking ability.  New numbness or paralysis occurs.  Sudden, severe headache occurs.  Vision is lost or suddenly gets worse in one or both eyes. If you ever feel like the person with dementia may hurt himself or herself or others, or if he or she shares thoughts  about taking his or her own life, get help right away. You can go to your nearest emergency department or:  Call your local emergency services (911 in the [place]  Call a suicide crisis helpline, such as the [place] at (506)387-4749. This is open 24 hours a day in the [place]  Text the Crisis Text Line at 434-867-8090 (in the [place].). Summary  Vascular dementia is a type of dementia. It results from brain damage that is caused by the brain not getting enough blood.  Vascular dementia is caused by conditions that reduce blood flow to the brain. Common causes of this condition include stroke and damage to small blood vessels in the brain.  Treatment focuses on treating the underlying cause of vascular dementia and managing any risk factors.  People with vascular dementia may need regular help at home or daily care from  a family member or home health care worker.  Contact a health care provider if you or your caregiver notices any new symptoms. This information is not intended to replace advice given to you by your health care provider. Make sure you discuss any questions you have with your health care provider. Document Revised: 10/26/2019 Document Reviewed: 10/26/2019 Elsevier Patient Education  [place].

## 2020-07-29 ENCOUNTER — Telehealth: Payer: Self-pay

## 2020-08-01 ENCOUNTER — Telehealth: Payer: Self-pay

## 2020-08-01 NOTE — Telephone Encounter (Signed)
Patient would like to know if she can start getting her monthly b12 injections done here? States that the [facility] is [place] far to drive. Please advise . Her next appointment for her b12 injection was 2/18

## 2020-08-01 NOTE — Telephone Encounter (Signed)
Spoke with daughter, her mom has gotten very weak in the last few days.  She called EMS today and they checked her vitals and everything was normal.  They recommended to her that she not be transported to the ER because of all the Covid cases and since her vitals were normal.  The daughter agrees and does not want her to go to the ER but instead wants her to be seen here by Dr. Livia Snellen. I scheduled her an appointment on 08/04/20 with Dr. Livia Snellen but did advise her that her mother continued to be this weak she should not wait until then to be seen by Dr. Livia Snellen but should instead go to the ER.  Daughter again says she does not want to do that.

## 2020-08-02 ENCOUNTER — Other Ambulatory Visit: Payer: Self-pay

## 2020-08-02 DIAGNOSIS — E538 Deficiency of other specified B group vitamins: Secondary | ICD-10-CM

## 2020-08-02 MED ORDER — CYANOCOBALAMIN 1000 MCG/ML IJ SOLN
1000.0000 ug | INTRAMUSCULAR | Status: DC
Start: 2020-08-02 — End: 2020-08-18
  Administered 2020-08-12: 1000 ug via INTRAMUSCULAR

## 2020-08-02 NOTE — Telephone Encounter (Signed)
That sounds like a good plan to me. When is the next shot due?

## 2020-08-02 NOTE — Telephone Encounter (Signed)
Returning nurse call.

## 2020-08-02 NOTE — Telephone Encounter (Signed)
Pts next B12 is due 08/12/20. I put her on the nurse schedule to come that day. Standing orders for monthly B12 injections placed.

## 2020-08-02 NOTE — Telephone Encounter (Signed)
Thanks. Need anythingelse from me?

## 2020-08-03 NOTE — Telephone Encounter (Signed)
Daughter aware.

## 2020-08-04 ENCOUNTER — Encounter: Payer: Self-pay | Admitting: Family Medicine

## 2020-08-04 ENCOUNTER — Ambulatory Visit (INDEPENDENT_AMBULATORY_CARE_PROVIDER_SITE_OTHER): Payer: Medicare Other

## 2020-08-04 ENCOUNTER — Ambulatory Visit (INDEPENDENT_AMBULATORY_CARE_PROVIDER_SITE_OTHER): Payer: Medicare Other | Admitting: Family Medicine

## 2020-08-04 ENCOUNTER — Other Ambulatory Visit: Payer: Self-pay

## 2020-08-04 VITALS — BP 130/62 | HR 64 | Temp 97.4°F | Resp 22 | Ht 61.0 in

## 2020-08-04 DIAGNOSIS — Z7901 Long term (current) use of anticoagulants: Secondary | ICD-10-CM | POA: Diagnosis not present

## 2020-08-04 DIAGNOSIS — E559 Vitamin D deficiency, unspecified: Secondary | ICD-10-CM

## 2020-08-04 DIAGNOSIS — R0689 Other abnormalities of breathing: Secondary | ICD-10-CM

## 2020-08-04 DIAGNOSIS — R5383 Other fatigue: Secondary | ICD-10-CM

## 2020-08-04 DIAGNOSIS — R2689 Other abnormalities of gait and mobility: Secondary | ICD-10-CM

## 2020-08-04 DIAGNOSIS — I2510 Atherosclerotic heart disease of native coronary artery without angina pectoris: Secondary | ICD-10-CM

## 2020-08-04 DIAGNOSIS — R062 Wheezing: Secondary | ICD-10-CM | POA: Diagnosis not present

## 2020-08-04 DIAGNOSIS — D500 Iron deficiency anemia secondary to blood loss (chronic): Secondary | ICD-10-CM

## 2020-08-04 DIAGNOSIS — I480 Paroxysmal atrial fibrillation: Secondary | ICD-10-CM | POA: Diagnosis not present

## 2020-08-04 DIAGNOSIS — R059 Cough, unspecified: Secondary | ICD-10-CM

## 2020-08-04 DIAGNOSIS — R0602 Shortness of breath: Secondary | ICD-10-CM | POA: Diagnosis not present

## 2020-08-04 DIAGNOSIS — E538 Deficiency of other specified B group vitamins: Secondary | ICD-10-CM

## 2020-08-04 DIAGNOSIS — G309 Alzheimer's disease, unspecified: Secondary | ICD-10-CM

## 2020-08-04 DIAGNOSIS — F0280 Dementia in other diseases classified elsewhere without behavioral disturbance: Secondary | ICD-10-CM

## 2020-08-04 DIAGNOSIS — R531 Weakness: Secondary | ICD-10-CM | POA: Diagnosis not present

## 2020-08-04 DIAGNOSIS — I517 Cardiomegaly: Secondary | ICD-10-CM | POA: Diagnosis not present

## 2020-08-04 DIAGNOSIS — J449 Chronic obstructive pulmonary disease, unspecified: Secondary | ICD-10-CM | POA: Diagnosis not present

## 2020-08-04 MED ORDER — MOXIFLOXACIN HCL 400 MG PO TABS: 400.0000 mg | ORAL_TABLET | Freq: Every day | ORAL | 0 refills | Status: DC

## 2020-08-04 MED ORDER — BETAMETHASONE SOD PHOS & ACET 6 (3-3) MG/ML IJ SUSP
6.0000 mg | Freq: Once | INTRAMUSCULAR | Status: AC
Start: 2020-08-04 — End: 2020-08-04
  Administered 2020-08-04: 6 mg via INTRAMUSCULAR

## 2020-08-04 NOTE — Progress Notes (Signed)
Subjective:  Patient ID: Crystal Cooper, female    DOB: 11-09-1926  Age: 87 y.o. MRN: [id number]  CC: Weakness   HPI [name] presents for WEAKNESS. nONFOCAL 2 DAYS AGO. bETTER NOW. WHEEZING, USING NEBS AT HOME.  Caregivers are having trouble waking her.  She is sluggish.  This lasted for 2 days and has somewhat improved now but she still having trouble standing up.  She is having trouble walking but not falling.  She is just weak.  Of note is that EMS called and would not take her to the hospital because there is too much Covid over there.  Family is concerned that she has had some coughing.  She has not had a fever.  Depression screen [facility] 2/9 08/04/2020 06/28/2020 02/09/2020  Decreased Interest 0 0 0  Down, Depressed, Hopeless 0 0 0  PHQ - 2 Score 0 0 0  Altered sleeping - - -  Tired, decreased energy - - -  Change in appetite - - -  Feeling bad or failure about yourself  - - -  Trouble concentrating - - -  Moving slowly or fidgety/restless - - -  Suicidal thoughts - - -  PHQ-9 Score - - -  Difficult doing work/chores - - -  Some recent data might be hidden    History Anahli has a past medical history of Acute CVA (cerebrovascular accident) ([place]) (08/27/2019), Acute GI bleeding (06/22/2020), Acute renal failure superimposed on stage 3b chronic kidney disease ([place]), AKI (acute kidney injury) ([place]), Anemia, Angio-edema, initial encounter (08/23/2019), Aortic stenosis, Arthritis, Asthma, Atrial fibrillation ([place]), Blood transfusion without reported diagnosis, CHF (congestive heart failure) ([place]) (11/2014), CKD (chronic kidney disease), Closed left hip fracture, initial encounter ([place]) (03/09/2020), Dementia ([place]), Family history of adverse reaction to anesthesia, Gastric AVM, GERD (gastroesophageal reflux disease), GI bleed, Glaucoma, Hearing loss, Heart murmur, HTN (hypertension), Hypokalemia, Myocardial infarction ([place]), PNA (pneumonia) (08/25/2019), Pneumonia, Presence of permanent cardiac  pacemaker, Prolapsing mitral leaflet syndrome, Shortness of breath dyspnea, Stroke ([place]), and SVT (supraventricular tachycardia) ([place]).   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   Her family history includes AAA (abdominal aortic aneurysm) in her brother; Cervical cancer in her daughter; Hypertension in her mother; Pneumonia in her father; Stomach cancer in her brother; Stroke in her brother.She reports that she has never smoked. She has never used smokeless tobacco. She reports that she does not drink alcohol and does not use drugs.    ROS Review of Systems  Unable to perform ROS: Dementia    Objective:  BP 130/62   Pulse 64   Temp (!) 97.4 F (36.3 C) (Temporal)   Resp (!) 22   Ht _0  (1.549 m)   SpO2 97%   BMI 22.56 kg/m   BP Readings from Last 3 Encounters:  08/04/20 130/62  07/26/20 109/61  07/22/20 (!) 100/50    Wt Readings from Last 3 Encounters:  07/07/20 119 lb 6.4 oz (54.2 kg)  07/05/20 115 lb 15.4 oz (52.6 kg)  06/22/20 116 lb (52.6 kg)  Physical Exam Constitutional:      General: She is not in acute distress.    Appearance: She is well-developed.  HENT:     Head: Normocephalic and atraumatic.  Eyes:     Conjunctiva/sclera:  Conjunctivae normal.     Pupils: Pupils are equal, round, and reactive to light.  Neck:     Thyroid: No thyromegaly.  Cardiovascular:     Rate and Rhythm: Normal rate and regular rhythm.     Heart sounds: Normal heart sounds. No murmur heard.   Pulmonary:     Effort: Pulmonary effort is normal. No respiratory distress.     Breath sounds: Normal breath sounds. No wheezing or rales.  Abdominal:     General: Bowel sounds are normal. There is no distension.     Palpations: Abdomen is soft.     Tenderness: There is no abdominal tenderness.  Musculoskeletal:     Cervical back: Normal range of motion and neck supple.     Comments: Patient is unable to stand on her own today.  She was not able to put weight on her legs.  Lymphadenopathy:     Cervical: No cervical adenopathy.  Skin:    General: Skin is warm and dry.  Neurological:     Mental Status: She is alert and oriented to person, place, and time.  Psychiatric:        Behavior: Behavior normal.        Thought Content: Thought content normal.        Judgment: Judgment normal.       Assessment & Plan:   [name] was seen today for weakness.  Diagnoses and all orders for this visit:  Cough -     DG Chest 2 View; Future -     CBC with Differential/Platelet -     CMP14+EGFR -     TSH + free T4 -     betamethasone acetate-betamethasone sodium phosphate (CELESTONE) injection 6 mg  B12 deficiency -     CBC with Differential/Platelet -     CMP14+EGFR -     TSH + free T4 -     Vitamin B12  Iron deficiency anemia due to chronic blood loss -     CBC with Differential/Platelet -     CMP14+EGFR -     TSH + free T4  Vitamin D deficiency -     CBC with Differential/Platelet -     CMP14+EGFR -     TSH + free T4 -     VITAMIN D 25 Hydroxy (Vit-D Deficiency, Fractures)  Weakness  Abnormal breath sounds -     betamethasone acetate-betamethasone sodium phosphate (CELESTONE) injection 6 mg  Alzheimer's dementia without  behavioral disturbance, unspecified timing of dementia onset (HCC)  Impairment of balance  Long term (current) use of anticoagulants  PAF (paroxysmal atrial fibrillation) (HCC)  Wheezing -     betamethasone acetate-betamethasone sodium phosphate (CELESTONE) injection 6 mg  Fatigue, unspecified type  Other orders -     Discontinue: moxifloxacin (AVELOX) 400 MG tablet; Take 1 tablet (400 mg total) by mouth daily. Take all of these, for infection       I am having Zari W. Bates maintain her Biotin, metoprolol tartrate, albuterol, Caltrate 600+D3, Breztri Aerosphere, famotidine, potassium chloride, furosemide, Euthyrox, docusate sodium, folic acid, iron polysaccharides, levalbuterol, pantoprazole, and memantine. We administered betamethasone acetate-betamethasone sodium phosphate. We will continue to administer cyanocobalamin.  Allergies as of 08/04/2020      Reactions  Hctz [hydrochlorothiazide] Other (See Comments)   Pt was ill and this affected her kidneys   Aspirin Other (See Comments)   Cardiologist said the patient is to not take this   Codeine Other (See Comments)   Made the patient feel ill, has not had any problems since 1977      Medication List       Accurate as of August 04, 2020 11:59 PM. If you have any questions, ask your nurse or doctor.        albuterol 108 (90 Base) MCG/ACT inhaler Commonly known as: ProAir HFA Inhale 2 puffs into the lungs every 4 (four) hours as needed for wheezing or shortness of breath.   Biotin 10 MG Caps Take 10 mg by mouth daily.   Breztri Aerosphere 160-9-4.8 MCG/ACT Aero Generic drug: Budeson-Glycopyrrol-Formoterol Inhale 2 Inhalers into the lungs in the morning and at bedtime.   Caltrate 600+D3 600-800 MG-UNIT Tabs Generic drug: Calcium Carb-Cholecalciferol Take 1 tablet by mouth daily.   docusate sodium 100 MG capsule Commonly known as: COLACE Take 1 capsule (100 mg total) by mouth 2 (two) times daily.    Euthyrox 25 MCG tablet Generic drug: levothyroxine Take 1 tablet by mouth once daily   famotidine 20 MG tablet Commonly known as: Pepcid Take 1 tablet (20 mg total) by mouth at bedtime.   folic acid 1 MG tablet Commonly known as: FOLVITE Take 1 tablet (1 mg total) by mouth daily.   furosemide 20 MG tablet Commonly known as: LASIX Take 4 tablets (80 mg total) by mouth daily.   iron polysaccharides 150 MG capsule Commonly known as: NIFEREX Take 1 capsule (150 mg total) by mouth daily.   levalbuterol 1.25 MG/3ML nebulizer solution Commonly known as: XOPENEX Take 1.25 mg by nebulization every 4 (four) hours as needed for wheezing. Dx J45.909   memantine 10 MG tablet Commonly known as: NAMENDA Take 1 tablet (10 mg total) by mouth 2 (two) times daily.   metoprolol tartrate 25 MG tablet Commonly known as: LOPRESSOR Take 1 tablet (25 mg total) by mouth 2 (two) times daily.   moxifloxacin 400 MG tablet Commonly known as: Avelox Take 1 tablet (400 mg total) by mouth daily. Take all of these, for infection Started by: [name], MD   pantoprazole 40 MG tablet Commonly known as: PROTONIX Take 1 tablet by mouth twice daily   potassium chloride 10 MEQ tablet Commonly known as: KLOR-CON Take 2 tablets (20 mEq total) by mouth 2 (two) times daily. What changed: how much to take        Follow-up: Return in about 6 weeks (around 09/15/2020).  [name], M.D.

## 2020-08-05 ENCOUNTER — Telehealth: Payer: Self-pay

## 2020-08-05 LAB — CBC WITH DIFFERENTIAL/PLATELET
Basophils Absolute: 0.1 10*3/uL (ref 0.0–0.2)
Basos: 1 %
EOS (ABSOLUTE): 0.1 10*3/uL (ref 0.0–0.4)
Eos: 2 %
Hematocrit: 30.6 % — ABNORMAL LOW (ref 34.0–46.6)
Hemoglobin: 9.8 g/dL — ABNORMAL LOW (ref 11.1–15.9)
Immature Grans (Abs): 0 10*3/uL (ref 0.0–0.1)
Immature Granulocytes: 0 %
Lymphocytes Absolute: 0.8 10*3/uL (ref 0.7–3.1)
Lymphs: 11 %
MCH: 28.4 pg (ref 26.6–33.0)
MCHC: 32 g/dL (ref 31.5–35.7)
MCV: 89 fL (ref 79–97)
Monocytes Absolute: 0.9 10*3/uL (ref 0.1–0.9)
Monocytes: 13 %
Neutrophils Absolute: 5.1 10*3/uL (ref 1.4–7.0)
Neutrophils: 73 %
Platelets: 280 10*3/uL (ref 150–450)
RBC: 3.45 x10E6/uL — ABNORMAL LOW (ref 3.77–5.28)
RDW: 19.2 % — ABNORMAL HIGH (ref 11.7–15.4)
WBC: 7 10*3/uL (ref 3.4–10.8)

## 2020-08-05 LAB — CMP14+EGFR
ALT: 9 IU/L (ref 0–32)
AST: 25 IU/L (ref 0–40)
Albumin/Globulin Ratio: 0.9 — ABNORMAL LOW (ref 1.2–2.2)
Albumin: 3.1 g/dL — ABNORMAL LOW (ref 3.5–4.6)
Alkaline Phosphatase: 133 IU/L — ABNORMAL HIGH (ref 44–121)
BUN/Creatinine Ratio: 19 (ref 12–28)
BUN: 24 mg/dL (ref 10–36)
Bilirubin Total: 0.4 mg/dL (ref 0.0–1.2)
CO2: 27 mmol/L (ref 20–29)
Calcium: 8.5 mg/dL — ABNORMAL LOW (ref 8.7–10.3)
Chloride: 94 mmol/L — ABNORMAL LOW (ref 96–106)
Creatinine, Ser: 1.27 mg/dL — ABNORMAL HIGH (ref 0.57–1.00)
GFR calc Af Amer: 43 mL/min/{1.73_m2} — ABNORMAL LOW (ref >59)
GFR calc non Af Amer: 37 mL/min/{1.73_m2} — ABNORMAL LOW (ref >59)
Globulin, Total: 3.3 g/dL (ref 1.5–4.5)
Glucose: 139 mg/dL — ABNORMAL HIGH (ref 65–99)
Potassium: 4.1 mmol/L (ref 3.5–5.2)
Sodium: 133 mmol/L — ABNORMAL LOW (ref 134–144)
Total Protein: 6.4 g/dL (ref 6.0–8.5)

## 2020-08-05 LAB — VITAMIN B12: Vitamin B-12: >2000 pg/mL — ABNORMAL HIGH (ref 232–1245)

## 2020-08-05 LAB — TSH+FREE T4
Free T4: 1.74 ng/dL (ref 0.82–1.77)
TSH: 3.1 u[IU]/mL (ref 0.450–4.500)

## 2020-08-05 LAB — VITAMIN D 25 HYDROXY (VIT D DEFICIENCY, FRACTURES): Vit D, 25-Hydroxy: 58.9 ng/mL (ref 30.0–100.0)

## 2020-08-05 MED ORDER — AZITHROMYCIN 250 MG PO TABS: ORAL_TABLET | ORAL | 0 refills | Status: DC

## 2020-08-05 NOTE — Telephone Encounter (Signed)
z pak prescription sent to pharmacy

## 2020-08-05 NOTE — Telephone Encounter (Signed)
Called stating that the antibiotic that was sent in for pt yesterday is not covered by her insurance and needs something else sent in that her insurance will cover.

## 2020-08-05 NOTE — Telephone Encounter (Signed)
[name] aware.

## 2020-08-05 NOTE — Progress Notes (Signed)
Patient aware and verbalized understanding. °

## 2020-08-08 ENCOUNTER — Telehealth: Payer: Self-pay | Admitting: *Deleted

## 2020-08-08 ENCOUNTER — Encounter: Payer: Self-pay | Admitting: Family Medicine

## 2020-08-08 NOTE — Telephone Encounter (Signed)
PA came in for Moxifloxacin - this was changed to Zpak on Friday - will cancel PA

## 2020-08-12 ENCOUNTER — Ambulatory Visit (INDEPENDENT_AMBULATORY_CARE_PROVIDER_SITE_OTHER): Payer: Medicare Other | Admitting: *Deleted

## 2020-08-12 ENCOUNTER — Encounter: Payer: Self-pay | Admitting: Nurse Practitioner

## 2020-08-12 ENCOUNTER — Ambulatory Visit (HOSPITAL_COMMUNITY): Payer: Medicare Other

## 2020-08-12 ENCOUNTER — Ambulatory Visit (INDEPENDENT_AMBULATORY_CARE_PROVIDER_SITE_OTHER): Payer: Medicare Other | Admitting: Nurse Practitioner

## 2020-08-12 ENCOUNTER — Other Ambulatory Visit: Payer: Self-pay

## 2020-08-12 VITALS — BP 107/71 | HR 61 | Temp 98.0°F | Ht 61.0 in | Wt 113.0 lb

## 2020-08-12 DIAGNOSIS — L89151 Pressure ulcer of sacral region, stage 1: Secondary | ICD-10-CM

## 2020-08-12 DIAGNOSIS — E538 Deficiency of other specified B group vitamins: Secondary | ICD-10-CM

## 2020-08-12 DIAGNOSIS — I2510 Atherosclerotic heart disease of native coronary artery without angina pectoris: Secondary | ICD-10-CM | POA: Diagnosis not present

## 2020-08-12 NOTE — Patient Instructions (Signed)
Preventing Pressure Injuries  A pressure injury, sometimes called a bedsore or a pressure ulcer, is an injury to the skin and underlying tissue caused by pressure. A pressure injury can happen when your skin presses against a surface, such as a mattress or wheelchair seat, for too long. The pressure on the blood vessels causes reduced blood flow to your skin. This can eventually cause the skin tissue to die and break down into a wound. Pressure injuries usually develop:  Over bony parts of the body, such as the tailbone, shoulders, elbows, hips, and heels.  Under medical devices, such as respiratory equipment, stockings, tubes, and splints. How can this condition affect me? Pressure injuries are caused by a lack of blood supply to an area of skin. These injuries begin as a reddened area on the skin and can become an open sore. They can result from intense pressure over a short period of time or from less pressure over a long period of time. Pressure injuries can vary in severity. They can cause pain, muscle damage, and infection. What can increase my risk? This condition is more likely to develop in people who:  Are in the hospital or an extended care facility.  Are bedridden or in a wheelchair.  Have an injury or disease that keeps them from: ? Moving normally. ? Feeling pain or pressure. ? Communicating if they feel pain or pressure.  Have a condition that: ? Makes them sleepy or less alert. ? Causes poor blood flow.  Need to wear a medical device.  Have poor control of their bladder or bowel functions (incontinence).  Have poor nutrition (malnutrition).  Have had this condition before.  Are of certain ethnicities. People of African American, Latino, or Hispanic descent are at higher risk compared to other ethnic groups. What actions can I take to prevent pressure injuries? Reducing and redistributing pressure  Do not lie or sit in one position for a long time. Move or change  position: ? Every hour when out of bed in a chair. ? Every two hours when in bed. ? As often as told by your health care provider.  Use pillows, wedges, or cushions to redistribute pressure. Ask your health care provider to recommend a mattress, cushions, or pads for you.  Use medical devices that do not rub your skin. Tell your health care provider if one of your medical devices is causing pain or irritation. Skin care If you are in the hospital, your health care providers:  Will inspect your skin, including areas under or around medical devices, at least twice a day.  May recommend that you use certain types of bedding to help prevent pressure injuries. These may include a pad, mattress, or chair cushion that is filled with gel, air, water, or foam.  Will evaluate your nutrition and consult a dietitian if needed.  Will inspect and change any wound dressings regularly.  May help you move into different positions every few hours.  Will adjust any medical devices and braces as needed to limit pressure on your skin.  Will keep your skin clean and dry.  May use gentle cleansers and skin protectants if you are incontinent.  Will moisturize any dry skin. In general, at home:  Keep your skin clean and dry. Gently pat your skin dry.  Do not rub or massage bony areas of your skin.  Moisturize dry skin.  Use gentle cleansers and skin protectants routinely if you are incontinent.  Check your skin at least once   a day for any changes in color and for any new blisters or sores. Make sure to check under and around any medical devices and between skin folds. Have a caregiver do this for you if you are not able.   Lifestyle  Be as active as you can every day. Ask your health care provider to suggest safe exercises or activities.  Do not abuse drugs or alcohol.  Do not use any products that contain nicotine or tobacco, such as cigarettes, e-cigarettes, and chewing tobacco. If you need  help quitting, ask your health care provider. General instructions  Take over-the-counter and prescription medicines only as told by your health care provider.  Work with your health care provider to manage any chronic health conditions.  Eat a healthy diet that includes protein, vitamins, and minerals. Ask your health care provider what types of food you should eat.  Drink enough fluid to keep your urine pale yellow.  Keep all follow-up visits as told by your health care provider. This is important.   Contact a health care provider if you:  Feel or see any changes in your skin. Summary  A pressure injury, sometimes called a bedsore or a pressure ulcer, is an injury to the skin and underlying tissue caused by pressure.  Do not lie or sit in one position for a long time.  Check your skin at least once a day for any changes in color and for any new blisters or sores.  Make sure to check under and around any medical devices and between skin folds. Have a caregiver do this for you if you are not able.  Eat a healthy diet that includes protein, vitamins, and minerals. Ask your health care provider what types of food you should eat. This information is not intended to replace advice given to you by your health care provider. Make sure you discuss any questions you have with your health care provider. Document Revised: 10/03/2018 Document Reviewed: 03/04/2018 Elsevier Patient Education  2021 [employer].

## 2020-08-12 NOTE — Progress Notes (Signed)
   Subjective:    Patient ID: Crystal Cooper, female    DOB: 1926-12-01, 87 y.o.   MRN: [id number]   Chief Complaint: Wound Check (On gluteal/)   HPI Pt presents today for bilateral gluteal wounds. Her daughter is her historian as she has advanced dementia. This has been ongoing for the last month. The wounds heal, but then break open again. She is incontinent and it causes them break open more often. Was seeing home health when she was d/c from the nursing home in Dec. Home health services was discontinued 3 weeks and the wounds opened up around a week after. Prior, the home health nurse was treating with skin protecting bandages.    Review of Systems  Constitutional: Negative for activity change, fatigue and fever.  Respiratory: Negative for cough and shortness of breath.   Cardiovascular: Negative for chest pain and palpitations.  Genitourinary: Negative for difficulty urinating and dysuria.  Skin: Positive for wound.       Objective:   Physical Exam Constitutional:      Appearance: Normal appearance.  Cardiovascular:     Rate and Rhythm: Normal rate and regular rhythm.     Pulses: Normal pulses.     Heart sounds: Normal heart sounds.  Pulmonary:     Effort: Pulmonary effort is normal.  Abdominal:     General: Abdomen is flat.     Palpations: Abdomen is soft.  Skin:    General: Skin is warm and dry.     Capillary Refill: Capillary refill takes less than 2 seconds.     Findings: Wound present.       Neurological:     Mental Status: She is alert. Mental status is at baseline. She is disoriented.  Psychiatric:        Mood and Affect: Mood normal.        Behavior: Behavior normal.    BP 107/71   Pulse 61   Temp 98 F (36.7 C) (Temporal)   Ht 5\' 1"  (1.549 m)   Wt 113 lb (51.3 kg)   SpO2 99%   BMI 21.35 kg/m         Assessment & Plan:   Crystal Cooper in today with chief complaint of Wound Check (On gluteal/)   1. Pressure injury of sacral region, stage  1 Begin using heart shaped Mepilex. 1 new bandage daily. Continue to use barrier cream while healing. When would heals, begin using Calmoseptine cream daily. Keep skin dry and clean. Use cusioning to relieve pressure on the sacral area, frequently change positions.   - For home use only DME Other see comment- mepilex- pads - For home use only DME Other see comment- calmoseptine cream     The above assessment and management plan was discussed with the patient. The patient verbalized understanding of and has agreed to the management plan. Patient is aware to call the clinic if symptoms persist or worsen. Patient is aware when to return to the clinic for a follow-up visit. Patient educated on when it is appropriate to go to the emergency department.   Mary-Margaret Hassell Done, FNP

## 2020-08-12 NOTE — Progress Notes (Signed)
Vitamin B12 injection given and patient tolerated well.  

## 2020-08-13 ENCOUNTER — Other Ambulatory Visit: Payer: Self-pay

## 2020-08-13 ENCOUNTER — Emergency Department (HOSPITAL_COMMUNITY): Payer: Medicare Other

## 2020-08-13 ENCOUNTER — Encounter (HOSPITAL_COMMUNITY): Payer: Self-pay

## 2020-08-13 ENCOUNTER — Inpatient Hospital Stay (HOSPITAL_COMMUNITY)
Admission: EM | Admit: 2020-08-13 | Discharge: 2020-08-16 | DRG: 065 | Disposition: A | Discharge status: Skilled Nursing Facility | Payer: Medicare Other | Attending: Internal Medicine | Admitting: Internal Medicine

## 2020-08-13 DIAGNOSIS — G309 Alzheimer's disease, unspecified: Secondary | ICD-10-CM | POA: Diagnosis not present

## 2020-08-13 DIAGNOSIS — E039 Hypothyroidism, unspecified: Secondary | ICD-10-CM | POA: Diagnosis present

## 2020-08-13 DIAGNOSIS — Z8711 Personal history of peptic ulcer disease: Secondary | ICD-10-CM

## 2020-08-13 DIAGNOSIS — H409 Unspecified glaucoma: Secondary | ICD-10-CM | POA: Diagnosis present

## 2020-08-13 DIAGNOSIS — I10 Essential (primary) hypertension: Secondary | ICD-10-CM | POA: Diagnosis not present

## 2020-08-13 DIAGNOSIS — I63512 Cerebral infarction due to unspecified occlusion or stenosis of left middle cerebral artery: Secondary | ICD-10-CM | POA: Diagnosis not present

## 2020-08-13 DIAGNOSIS — H9190 Unspecified hearing loss, unspecified ear: Secondary | ICD-10-CM | POA: Diagnosis present

## 2020-08-13 DIAGNOSIS — R000 Tachycardia, unspecified: Secondary | ICD-10-CM | POA: Diagnosis not present

## 2020-08-13 DIAGNOSIS — K922 Gastrointestinal hemorrhage, unspecified: Secondary | ICD-10-CM | POA: Diagnosis not present

## 2020-08-13 DIAGNOSIS — Z8249 Family history of ischemic heart disease and other diseases of the circulatory system: Secondary | ICD-10-CM

## 2020-08-13 DIAGNOSIS — L8991 Pressure ulcer of unspecified site, stage 1: Secondary | ICD-10-CM | POA: Diagnosis present

## 2020-08-13 DIAGNOSIS — Z7901 Long term (current) use of anticoagulants: Secondary | ICD-10-CM

## 2020-08-13 DIAGNOSIS — Z79899 Other long term (current) drug therapy: Secondary | ICD-10-CM

## 2020-08-13 DIAGNOSIS — R531 Weakness: Secondary | ICD-10-CM | POA: Diagnosis not present

## 2020-08-13 DIAGNOSIS — I252 Old myocardial infarction: Secondary | ICD-10-CM

## 2020-08-13 DIAGNOSIS — F0280 Dementia in other diseases classified elsewhere without behavioral disturbance: Secondary | ICD-10-CM | POA: Diagnosis not present

## 2020-08-13 DIAGNOSIS — R4701 Aphasia: Secondary | ICD-10-CM | POA: Diagnosis present

## 2020-08-13 DIAGNOSIS — J449 Chronic obstructive pulmonary disease, unspecified: Secondary | ICD-10-CM | POA: Diagnosis present

## 2020-08-13 DIAGNOSIS — I480 Paroxysmal atrial fibrillation: Secondary | ICD-10-CM | POA: Diagnosis not present

## 2020-08-13 DIAGNOSIS — R2990 Unspecified symptoms and signs involving the nervous system: Secondary | ICD-10-CM | POA: Diagnosis not present

## 2020-08-13 DIAGNOSIS — L8990 Pressure ulcer of unspecified site, unspecified stage: Secondary | ICD-10-CM | POA: Insufficient documentation

## 2020-08-13 DIAGNOSIS — K219 Gastro-esophageal reflux disease without esophagitis: Secondary | ICD-10-CM | POA: Diagnosis present

## 2020-08-13 DIAGNOSIS — G9389 Other specified disorders of brain: Secondary | ICD-10-CM | POA: Diagnosis present

## 2020-08-13 DIAGNOSIS — Z66 Do not resuscitate: Secondary | ICD-10-CM | POA: Diagnosis not present

## 2020-08-13 DIAGNOSIS — Z20822 Contact with and (suspected) exposure to covid-19: Secondary | ICD-10-CM | POA: Diagnosis present

## 2020-08-13 DIAGNOSIS — G8191 Hemiplegia, unspecified affecting right dominant side: Secondary | ICD-10-CM | POA: Diagnosis present

## 2020-08-13 DIAGNOSIS — Z885 Allergy status to narcotic agent status: Secondary | ICD-10-CM

## 2020-08-13 DIAGNOSIS — Z823 Family history of stroke: Secondary | ICD-10-CM

## 2020-08-13 DIAGNOSIS — F0150 Vascular dementia without behavioral disturbance: Secondary | ICD-10-CM | POA: Diagnosis present

## 2020-08-13 DIAGNOSIS — R2689 Other abnormalities of gait and mobility: Secondary | ICD-10-CM | POA: Diagnosis not present

## 2020-08-13 DIAGNOSIS — R4182 Altered mental status, unspecified: Secondary | ICD-10-CM | POA: Diagnosis not present

## 2020-08-13 DIAGNOSIS — I5032 Chronic diastolic (congestive) heart failure: Secondary | ICD-10-CM | POA: Diagnosis present

## 2020-08-13 DIAGNOSIS — R262 Difficulty in walking, not elsewhere classified: Secondary | ICD-10-CM

## 2020-08-13 DIAGNOSIS — N183 Chronic kidney disease, stage 3 unspecified: Secondary | ICD-10-CM | POA: Diagnosis present

## 2020-08-13 DIAGNOSIS — I495 Sick sinus syndrome: Secondary | ICD-10-CM | POA: Diagnosis present

## 2020-08-13 DIAGNOSIS — J322 Chronic ethmoidal sinusitis: Secondary | ICD-10-CM | POA: Diagnosis present

## 2020-08-13 DIAGNOSIS — I130 Hypertensive heart and chronic kidney disease with heart failure and stage 1 through stage 4 chronic kidney disease, or unspecified chronic kidney disease: Secondary | ICD-10-CM | POA: Diagnosis present

## 2020-08-13 DIAGNOSIS — Z8673 Personal history of transient ischemic attack (TIA), and cerebral infarction without residual deficits: Secondary | ICD-10-CM

## 2020-08-13 DIAGNOSIS — R5381 Other malaise: Secondary | ICD-10-CM | POA: Diagnosis present

## 2020-08-13 DIAGNOSIS — Z8049 Family history of malignant neoplasm of other genital organs: Secondary | ICD-10-CM

## 2020-08-13 DIAGNOSIS — R54 Age-related physical debility: Secondary | ICD-10-CM | POA: Diagnosis not present

## 2020-08-13 DIAGNOSIS — F0390 Unspecified dementia without behavioral disturbance: Secondary | ICD-10-CM | POA: Diagnosis present

## 2020-08-13 DIAGNOSIS — E785 Hyperlipidemia, unspecified: Secondary | ICD-10-CM | POA: Diagnosis present

## 2020-08-13 DIAGNOSIS — R29810 Facial weakness: Secondary | ICD-10-CM | POA: Diagnosis present

## 2020-08-13 DIAGNOSIS — Z950 Presence of cardiac pacemaker: Secondary | ICD-10-CM

## 2020-08-13 DIAGNOSIS — N1832 Chronic kidney disease, stage 3b: Secondary | ICD-10-CM

## 2020-08-13 DIAGNOSIS — I6602 Occlusion and stenosis of left middle cerebral artery: Secondary | ICD-10-CM | POA: Diagnosis not present

## 2020-08-13 DIAGNOSIS — G9349 Other encephalopathy: Secondary | ICD-10-CM | POA: Diagnosis not present

## 2020-08-13 DIAGNOSIS — Z800 Family history of malignant neoplasm of digestive organs: Secondary | ICD-10-CM

## 2020-08-13 DIAGNOSIS — R350 Frequency of micturition: Secondary | ICD-10-CM

## 2020-08-13 DIAGNOSIS — I639 Cerebral infarction, unspecified: Secondary | ICD-10-CM

## 2020-08-13 DIAGNOSIS — Z886 Allergy status to analgesic agent status: Secondary | ICD-10-CM

## 2020-08-13 DIAGNOSIS — J439 Emphysema, unspecified: Secondary | ICD-10-CM | POA: Diagnosis not present

## 2020-08-13 DIAGNOSIS — R404 Transient alteration of awareness: Secondary | ICD-10-CM | POA: Diagnosis not present

## 2020-08-13 DIAGNOSIS — Z888 Allergy status to other drugs, medicaments and biological substances status: Secondary | ICD-10-CM

## 2020-08-13 DIAGNOSIS — D631 Anemia in chronic kidney disease: Secondary | ICD-10-CM | POA: Diagnosis present

## 2020-08-13 LAB — RESP PANEL BY RT-PCR (FLU A&B, COVID) ARPGX2
Influenza A by PCR: NEGATIVE
Influenza B by PCR: NEGATIVE
SARS Coronavirus 2 by RT PCR: NEGATIVE

## 2020-08-13 LAB — DIFFERENTIAL
Abs Immature Granulocytes: 0.03 10*3/uL (ref 0.00–0.07)
Basophils Absolute: 0.1 10*3/uL (ref 0.0–0.1)
Basophils Relative: 1 %
Eosinophils Absolute: 0.1 10*3/uL (ref 0.0–0.5)
Eosinophils Relative: 1 %
Immature Granulocytes: 0 %
Lymphocytes Relative: 11 %
Lymphs Abs: 0.8 10*3/uL (ref 0.7–4.0)
Monocytes Absolute: 0.5 10*3/uL (ref 0.1–1.0)
Monocytes Relative: 7 %
Neutro Abs: 5.9 10*3/uL (ref 1.7–7.7)
Neutrophils Relative %: 80 %

## 2020-08-13 LAB — CBC
HCT: 31.8 % — ABNORMAL LOW (ref 36.0–46.0)
Hemoglobin: 9.4 g/dL — ABNORMAL LOW (ref 12.0–15.0)
MCH: 28.3 pg (ref 26.0–34.0)
MCHC: 29.6 g/dL — ABNORMAL LOW (ref 30.0–36.0)
MCV: 95.8 fL (ref 80.0–100.0)
Platelets: 270 10*3/uL (ref 150–400)
RBC: 3.32 MIL/uL — ABNORMAL LOW (ref 3.87–5.11)
RDW: 20.7 % — ABNORMAL HIGH (ref 11.5–15.5)
WBC: 7.5 10*3/uL (ref 4.0–10.5)
nRBC: 0 % (ref 0.0–0.2)

## 2020-08-13 LAB — I-STAT CHEM 8, ED
BUN: 25 mg/dL — ABNORMAL HIGH (ref 8–23)
Calcium, Ion: 1.1 mmol/L — ABNORMAL LOW (ref 1.15–1.40)
Chloride: 97 mmol/L — ABNORMAL LOW (ref 98–111)
Creatinine, Ser: 1.3 mg/dL — ABNORMAL HIGH (ref 0.44–1.00)
Glucose, Bld: 116 mg/dL — ABNORMAL HIGH (ref 70–99)
HCT: 31 % — ABNORMAL LOW (ref 36.0–46.0)
Hemoglobin: 10.5 g/dL — ABNORMAL LOW (ref 12.0–15.0)
Potassium: 3.8 mmol/L (ref 3.5–5.1)
Sodium: 136 mmol/L (ref 135–145)
TCO2: 32 mmol/L (ref 22–32)

## 2020-08-13 LAB — COMPREHENSIVE METABOLIC PANEL
ALT: 15 U/L (ref 0–44)
AST: 26 U/L (ref 15–41)
Albumin: 2.9 g/dL — ABNORMAL LOW (ref 3.5–5.0)
Alkaline Phosphatase: 92 U/L (ref 38–126)
Anion gap: 8 (ref 5–15)
BUN: 24 mg/dL — ABNORMAL HIGH (ref 8–23)
CO2: 29 mmol/L (ref 22–32)
Calcium: 8.7 mg/dL — ABNORMAL LOW (ref 8.9–10.3)
Chloride: 98 mmol/L (ref 98–111)
Creatinine, Ser: 1.28 mg/dL — ABNORMAL HIGH (ref 0.44–1.00)
GFR, Estimated: 40 mL/min — ABNORMAL LOW (ref >60)
Glucose, Bld: 122 mg/dL — ABNORMAL HIGH (ref 70–99)
Potassium: 3.9 mmol/L (ref 3.5–5.1)
Sodium: 135 mmol/L (ref 135–145)
Total Bilirubin: 0.7 mg/dL (ref 0.3–1.2)
Total Protein: 6.3 g/dL — ABNORMAL LOW (ref 6.5–8.1)

## 2020-08-13 LAB — URINALYSIS, ROUTINE W REFLEX MICROSCOPIC
Bilirubin Urine: NEGATIVE
Glucose, UA: NEGATIVE mg/dL
Hgb urine dipstick: NEGATIVE
Ketones, ur: NEGATIVE mg/dL
Leukocytes,Ua: NEGATIVE
Nitrite: NEGATIVE
Protein, ur: NEGATIVE mg/dL
Specific Gravity, Urine: 1.026 (ref 1.005–1.030)
pH: 8 (ref 5.0–8.0)

## 2020-08-13 LAB — RAPID URINE DRUG SCREEN, HOSP PERFORMED
Amphetamines: NOT DETECTED
Barbiturates: NOT DETECTED
Benzodiazepines: NOT DETECTED
Cocaine: NOT DETECTED
Opiates: NOT DETECTED
Tetrahydrocannabinol: NOT DETECTED

## 2020-08-13 LAB — PROTIME-INR
INR: 1.2 (ref 0.8–1.2)
Prothrombin Time: 14.3 seconds (ref 11.4–15.2)

## 2020-08-13 LAB — APTT: aPTT: 25 seconds (ref 24–36)

## 2020-08-13 LAB — CBG MONITORING, ED: Glucose-Capillary: 114 mg/dL — ABNORMAL HIGH (ref 70–99)

## 2020-08-13 LAB — ETHANOL: Alcohol, Ethyl (B): <10 mg/dL (ref <10)

## 2020-08-13 MED ORDER — UMECLIDINIUM BROMIDE 62.5 MCG/INH IN AEPB
1.0000 | INHALATION_SPRAY | Freq: Every day | RESPIRATORY_TRACT | Status: DC
  Administered 2020-08-13 – 2020-08-16 (×3): 1 via RESPIRATORY_TRACT
  Filled 2020-08-13: qty 7

## 2020-08-13 MED ORDER — SODIUM CHLORIDE 0.9 % IV SOLN: INTRAVENOUS | Status: AC

## 2020-08-13 MED ORDER — ACETAMINOPHEN 160 MG/5ML PO SOLN: 650.0000 mg | ORAL | Status: DC | PRN

## 2020-08-13 MED ORDER — LEVOTHYROXINE SODIUM 25 MCG PO TABS
25.0000 ug | ORAL_TABLET | Freq: Every day | ORAL | Status: DC
  Administered 2020-08-14 – 2020-08-16 (×3): 25 ug via ORAL
  Filled 2020-08-13 (×3): qty 1

## 2020-08-13 MED ORDER — STROKE: EARLY STAGES OF RECOVERY BOOK: Freq: Once | Status: DC

## 2020-08-13 MED ORDER — IOHEXOL 350 MG/ML SOLN
100.0000 mL | Freq: Once | INTRAVENOUS | Status: AC | PRN
  Administered 2020-08-13: 100 mL via INTRAVENOUS

## 2020-08-13 MED ORDER — ARFORMOTEROL TARTRATE 15 MCG/2ML IN NEBU
15.0000 ug | INHALATION_SOLUTION | Freq: Two times a day (BID) | RESPIRATORY_TRACT | Status: DC
  Administered 2020-08-13 – 2020-08-16 (×5): 15 ug via RESPIRATORY_TRACT
  Filled 2020-08-13 (×6): qty 2

## 2020-08-13 MED ORDER — ACETAMINOPHEN 325 MG PO TABS: 650.0000 mg | ORAL_TABLET | ORAL | Status: DC | PRN

## 2020-08-13 MED ORDER — FAMOTIDINE 20 MG PO TABS
20.0000 mg | ORAL_TABLET | Freq: Every day | ORAL | Status: DC
  Administered 2020-08-13 – 2020-08-15 (×3): 20 mg via ORAL
  Filled 2020-08-13 (×3): qty 1

## 2020-08-13 MED ORDER — DOCUSATE SODIUM 100 MG PO CAPS
100.0000 mg | ORAL_CAPSULE | Freq: Two times a day (BID) | ORAL | Status: DC
  Administered 2020-08-13 – 2020-08-16 (×6): 100 mg via ORAL
  Filled 2020-08-13 (×6): qty 1

## 2020-08-13 MED ORDER — ACETAMINOPHEN 650 MG RE SUPP: 650.0000 mg | RECTAL | Status: DC | PRN

## 2020-08-13 MED ORDER — PANTOPRAZOLE SODIUM 40 MG PO TBEC
40.0000 mg | DELAYED_RELEASE_TABLET | Freq: Two times a day (BID) | ORAL | Status: DC
  Administered 2020-08-13 – 2020-08-16 (×6): 40 mg via ORAL
  Filled 2020-08-13 (×6): qty 1

## 2020-08-13 MED ORDER — MEMANTINE HCL 10 MG PO TABS
10.0000 mg | ORAL_TABLET | Freq: Two times a day (BID) | ORAL | Status: DC
  Administered 2020-08-13 – 2020-08-16 (×6): 10 mg via ORAL
  Filled 2020-08-13 (×6): qty 1

## 2020-08-13 MED ORDER — METOPROLOL TARTRATE 25 MG PO TABS
25.0000 mg | ORAL_TABLET | Freq: Two times a day (BID) | ORAL | Status: DC
  Administered 2020-08-14 – 2020-08-16 (×5): 25 mg via ORAL
  Filled 2020-08-13 (×6): qty 1

## 2020-08-13 MED ORDER — BUDESONIDE 0.25 MG/2ML IN SUSP
0.2500 mg | Freq: Two times a day (BID) | RESPIRATORY_TRACT | Status: DC
  Administered 2020-08-13 – 2020-08-16 (×5): 0.25 mg via RESPIRATORY_TRACT
  Filled 2020-08-13 (×6): qty 2

## 2020-08-13 MED ORDER — ALTEPLASE 100 MG IV SOLR
INTRAVENOUS | Status: AC
  Filled 2020-08-13: qty 100

## 2020-08-13 MED ORDER — BUDESON-GLYCOPYRROL-FORMOTEROL 160-9-4.8 MCG/ACT IN AERO: 2.0000 | INHALATION_SPRAY | Freq: Two times a day (BID) | RESPIRATORY_TRACT | Status: DC

## 2020-08-13 MED ORDER — ALBUTEROL SULFATE HFA 108 (90 BASE) MCG/ACT IN AERS: 2.0000 | INHALATION_SPRAY | RESPIRATORY_TRACT | Status: DC | PRN

## 2020-08-13 NOTE — Progress Notes (Signed)
Very hard of hearing but could follow some commands after understanding.  Able to state name of some of handout pictures but not all. Able  to read all words and phrases with no hesitation and with clarity.  Daughter says that either herself or sitters are with patient 24/7  and that patient walks with walker, (assisted) at home.  Daughter spending the night

## 2020-08-13 NOTE — ED Notes (Signed)
New IV started on pt in left forearm. Pt now showing verbal response. Pt yelled "ouch your hurting me" when doing IV. This is first verbal response pt has made since coming to ED.

## 2020-08-13 NOTE — ED Triage Notes (Signed)
Pt brought to ED via [facility]. Per caregiver, pt won't talk or follow commands, won't walk, unsteady when trying to ambulate pt, started 2100 last night. LKW 08/12/20 at 2100. CBG 156.

## 2020-08-13 NOTE — ED Notes (Signed)
Pt 18G IV infiltrated during perfusion study in CT. Arm is cool to touch. IV removed. MD made aware.

## 2020-08-13 NOTE — ED Notes (Signed)
Pt soiled bed. Pt cleaned and changed, bed linens also changed. Pure wick placed on pt to obtain urine sample.

## 2020-08-13 NOTE — ED Notes (Signed)
Pt transported to CT ?

## 2020-08-13 NOTE — H&P (Signed)
History and Physical    Crystal Cooper VPX:[id number] DOB: [birth year] DOA: 08/13/2020  PCP: [name], MD   Patient coming from: home  I have personally briefly reviewed patient's old medical records in [place]  Chief Complaint: stroke like symptoms.  HPI: Crystal Cooper is a 87 y.o. female with medical history significant of CKD stage 3b, PAF, dementia, GI bleed, pacemaker implantation, HTN, prior hx of stroke, GERD and chronic diastolic HF; who presented to ED secondary to right side leaning, slow to respond and inability to walk.  Per caregiver patient symptoms worsen and may help to become disoriented.  She was last seen normal around 7:45 in the morning.  By the time the patient presented to the emergency department, her history was confirmed she was outside the therapeutic window for TPA. Patient denies chest pain, fever, chills, nausea, vomiting, dysuria, hematuria, melena, hematochezia, abdominal pain or any other complaints.  ED Course: Patient with aphasia and mild left facial droop.  CT angiogram demonstrated left M1 occlusion with good collateral flow.  CT perfusion scan with a mismatch of 26 mL concerning for acute left MCA stroke.  Teleneurology was consulted who has recommended admission to complete a stroke work-up.  Review of Systems: As per HPI otherwise all other systems reviewed and are negative.   Past Medical History:  Diagnosis Date  . Acute CVA (cerebrovascular accident) ([place]) 08/27/2019  . Acute GI bleeding 06/22/2020  . Acute renal failure superimposed on stage 3b chronic kidney disease ([place])   . AKI (acute kidney injury) ([place])   . Anemia    years ago  . Angio-edema, initial encounter 08/23/2019  . Aortic stenosis   . Arthritis   . Asthma   . Atrial fibrillation ([place])   . Blood transfusion without reported diagnosis   . CHF (congestive heart failure) ([place]) 11/2014  . CKD (chronic kidney disease)    had many uti this year   . Closed left hip  fracture, initial encounter ([place]) 03/09/2020  . Dementia ([place])   . Family history of adverse reaction to anesthesia    2 daughters would have N/V  . Gastric AVM   . GERD (gastroesophageal reflux disease)    was seen by Gi for bleeding ulcer which was cauterized   . GI bleed   . Glaucoma   . Hearing loss   . Heart murmur    Rheumatic fever whe she was young,  sees Dr. Stanford Breed in [place]  . HTN (hypertension)   . Hypokalemia   . Myocardial infarction ([place])    in her 26 when she had two heart attacks   . PNA (pneumonia) 08/25/2019  . Pneumonia    stayed from friday to sunday in august  . Presence of permanent cardiac pacemaker    placed about 4 years ago  . Prolapsing mitral leaflet syndrome   . Shortness of breath dyspnea    with exertion  . Stroke ([place])   . SVT (supraventricular tachycardia) ([facility])    S/P ablation of AVNRT in 2003    Past Surgical History:  Procedure Laterality Date  . APPENDECTOMY    . BIOPSY  04/07/2018   Procedure: BIOPSY;  Surgeon: Jerene Bears, MD;  Location: [facility] ENDOSCOPY;  Service: Gastroenterology;;  . BIOPSY  02/19/2020   Procedure: BIOPSY;  Surgeon: Harvel Quale, MD;  [facility] END[facility] SU[facility];  Service: Gastroenterology;;  gastric   . BLADDER SURGERY    . CARDIAC CATHETERIZATION N/A 09/26/2015   Procedure:  Right/Left Heart Cath and Coronary Angiography;  Surgeon: Burnell Blanks, MD;  Location: [facility] CV LAB;  Service: Cardiovascular;  Laterality: N/A;  . CARDIAC SURGERY    . CARDIOVERSION N/A 03/02/2016   Procedure: CARDIOVERSION;  Surgeon: Thayer Headings, MD;  Location: [facility] ENDOSCOPY;  Service: Cardiovascular;  Laterality: N/A;  . ENTEROSCOPY  02/19/2020   Procedure: ENTEROSCOPY;  Surgeon: Harvel Quale, MD;  [facility] ENDO [facility];  Service: Gastroenterology;;  . EP IMPLANTABLE DEVICE N/A 10/26/2015   Procedure: Pacemaker Implant;  Surgeon: Will Meredith Leeds, MD;  Location: [facility] CV LAB;  Service:  Cardiovascular;  Laterality: N/A;  . ESOPHAGOGASTRODUODENOSCOPY (EGD) WITH PROPOFOL N/A 04/07/2018   Procedure: ESOPHAGOGASTRODUODENOSCOPY (EGD) WITH PROPOFOL;  Surgeon: Jerene Bears, MD;  Location[facility] ENDOSCOPY;  Service: Gastroenterology;  Laterality: N/A;  . ESOPHAGOGASTRODUODENOSCOPY (EGD) WITH PROPOFOL N/A 10/12/2019   Procedure: ESOPHAGOGASTRODUODENOSCOPY (EGD) WITH PROPOFOL;  Surgeon: Danie Binder, MD;  [facility];  Service: Endoscopy;  Laterality: N/A;  . EYE SURGERY Bilateral    cataract surgery  . FLEXIBLE SIGMOIDOSCOPY  02/19/2020   Procedure: FLEXIBLE SIGMOIDOSCOPY;  Surgeon: Harvel Quale, MD;  [facility];  Service: Gastroenterology;;  . HIP FRACTURE SURGERY Left 02/2020  . HOT HEMOSTASIS N/A 04/07/2018   Procedure: HOT HEMOSTASIS (ARGON PLASMA COAGULATION/BICAP);  Surgeon: Jerene Bears, MD;  Location: [facility] ENDOSCOPY;  Service: Gastroenterology;  Laterality: N/A;  . HOT HEMOSTASIS  10/12/2019   Procedure: HOT HEMOSTASIS (ARGON PLASMA COAGULATION/BICAP);  Surgeon: Danie Binder, MD;  [facility] ENDO [facility];  Service: Endoscopy;;  . INTRAMEDULLARY (IM) NAIL INTERTROCHANTERIC Left 03/10/2020   Procedure: OPEN TREATMENT INTERNAL FIXATION LEFT HIP (WITH GAMMA NAIL);  Surgeon: Carole Civil, MD;  Location: [facility] ORS;  Service: Orthopedics;  Laterality: Left;  . NASAL SINUS SURGERY    . PACEMAKER INSERTION    . TEE WITHOUT CARDIOVERSION N/A 10/25/2015   Procedure: TRANSESOPHAGEAL ECHOCARDIOGRAM (TEE);  Surgeon: Burnell Blanks, MD;  Location: [place];  Service: Open Heart Surgery;  Laterality: N/A;  . TRANSCATHETER AORTIC VALVE REPLACEMENT, TRANSFEMORAL Right 10/25/2015   Procedure: TRANSCATHETER AORTIC VALVE REPLACEMENT, TRANSFEMORAL;  Surgeon: Burnell Blanks, MD;  Location: [facility];  Service: Open Heart Surgery;  Laterality: Right;    Social History  reports that she has never smoked. She has never used smokeless tobacco. She reports  that she does not drink alcohol and does not use drugs.  Allergies  Allergen Reactions  . Hctz [Hydrochlorothiazide] Other (See Comments)    Pt was ill and this affected her kidneys   . Aspirin Other (See Comments)    Cardiologist said the patient is to not take this  . Codeine Other (See Comments)    Made the patient feel ill, has not had any problems since 1977    Family History  Problem Relation Age of Onset  . Hypertension Mother   . Pneumonia Father   . Stomach cancer Brother   . Stroke Brother   . AAA (abdominal aortic aneurysm) Brother   . Cervical cancer Daughter   . Heart attack Neg Hx   . Colon cancer Neg Hx   . Esophageal cancer Neg Hx     Prior to Admission medications   Medication Sig Start Date End Date Taking? Authorizing Provider  albuterol (PROAIR HFA) 108 (90 Base) MCG/ACT inhaler Inhale 2 puffs into the lungs every 4 (four) hours as needed for wheezing or shortness of breath. 08/05/19  Yes Rakes, Connye Burkitt,  FNP  Biotin 10 MG CAPS Take 10 mg by mouth daily.    Yes [provider]  Budeson-Glycopyrrol-Formoterol (BREZTRI AEROSPHERE) 160-9-4.8 MCG/ACT AERO Inhale 2 Inhalers into the lungs in the morning and at bedtime. 11/27/19  Yes Ivy Lynn, NP  Calcium Carb-Cholecalciferol (CALTRATE 600+D3) 600-800 MG-UNIT TABS Take 1 tablet by mouth daily.   Yes [provider]  docusate sodium (COLACE) 100 MG capsule Take 1 capsule (100 mg total) by mouth 2 (two) times daily. 03/15/20  Yes Barton Dubois, MD  EUTHYROX 25 MCG tablet Take 1 tablet by mouth once daily 03/07/20  Yes Stacks, [name], MD  famotidine (PEPCID) 20 MG tablet Take 1 tablet (20 mg total) by mouth at bedtime. 12/21/19 12/20/20 Yes Stacks, [name], MD  folic acid (FOLVITE) 1 MG tablet Take 1 tablet (1 mg total) by mouth daily. 06/21/20  Yes Kilroy, Luke K, PA-C  furosemide (LASIX) 20 MG tablet Take 4 tablets (80 mg total) by mouth daily. 02/22/20  Yes Lelon Perla, MD  iron  polysaccharides (NIFEREX) 150 MG capsule Take 1 capsule (150 mg total) by mouth daily. 06/29/20  Yes Lelon Perla, MD  levalbuterol Penne Lash) 1.25 MG/3ML nebulizer solution Take 1.25 mg by nebulization every 4 (four) hours as needed for wheezing. Dx J45.909 07/07/20  Yes [name], MD  memantine (NAMENDA) 10 MG tablet Take 1 tablet (10 mg total) by mouth 2 (two) times daily. 07/26/20  Yes Frann Rider, NP  metoprolol tartrate (LOPRESSOR) 25 MG tablet Take 1 tablet (25 mg total) by mouth 2 (two) times daily. 05/20/19  Yes Camnitz, Will Hassell Done, MD  pantoprazole (PROTONIX) 40 MG tablet Take 1 tablet by mouth twice daily 07/13/20  Yes Stacks, [name], MD  potassium chloride SA (KLOR-CON) 10 MEQ tablet Take 2 tablets (20 mEq total) by mouth 2 (two) times daily. Patient taking differently: Take 10 mEq by mouth 2 (two) times daily. 02/09/20  Yes Stacks, [name], MD  vitamin B-12 (CYANOCOBALAMIN) 1000 MCG tablet Take 1,000 mcg by mouth daily.   Yes [provider]    Physical Exam: Vitals:   08/13/20 1336 08/13/20 1400 08/13/20 1445 08/13/20 1552  BP: (!) 141/66 (!) 137/57  (!) 150/77  Pulse: 64 60  60  Resp: 17 (!) 24  17  Temp:   99.5 F (37.5 C) 98.3 F (36.8 C)  TempSrc:   Rectal Oral  SpO2: 98% 94%  94%  Weight:      Height:        Constitutional: NAD, calm, following simple commands.  With some difficulties finding words and expressing herself.  Also demonstrating right-sided weakness and poor coordination. Vitals:   08/13/20 1336 08/13/20 1400 08/13/20 1445 08/13/20 1552  BP: (!) 141/66 (!) 137/57  (!) 150/77  Pulse: 64 60  60  Resp: 17 (!) 24  17  Temp:   99.5 F (37.5 C) 98.3 F (36.8 C)  TempSrc:   Rectal Oral  SpO2: 98% 94%  94%  Weight:      Height:       Eyes: PERRL, lids and conjunctivae normal; no icterus, no nystagmus. ENMT: Mucous membranes are moist. Posterior pharynx clear of any exudate or lesions. Neck: normal, supple, no masses, no thyromegaly; no  JVD. Respiratory: clear to auscultation bilaterally, no wheezing, no crackles. Normal respiratory effort. No accessory muscle use.  Good saturation on room air. Cardiovascular: Regular rate; currently sinus rhythm.  Pulse murmur appreciated.  Trace to 1+ lower extremity edema bilaterally appreciated.  Pacemaker  in place. Abdomen: no tenderness, no masses palpated. No hepatosplenomegaly. Bowel sounds positive.  Musculoskeletal: no clubbing / cyanosis. No joint deformity upper and lower extremities. Good ROM, no contractures.   Skin: No petechiae, no induration. Neurologic: Right side upper and lower extremity muscle strength 3 out of 5; left upper extremity and lower extremity muscle strength 4 out of 5 bilaterally in the setting of poor effort and deconditioning.  Cranial nerves grossly intact.  Positive for facial and mild left facial droop. Psychiatric: Normal mood.  Judgment and insight impaired secondary to underlying dementia.  Labs on Admission: I have personally reviewed following labs and imaging studies  CBC: Recent Labs  Lab 08/13/20 1215 08/13/20 1222  WBC 7.5  --   NEUTROABS 5.9  --   HGB 9.4* 10.5*  HCT 31.8* 31.0*  MCV 95.8  --   PLT 270  --     Basic Metabolic Panel: Recent Labs  Lab 08/13/20 1215 08/13/20 1222  NA 135 136  K 3.9 3.8  CL 98 97*  CO2 29  --   GLUCOSE 122* 116*  BUN 24* 25*  CREATININE 1.28* 1.30*  CALCIUM 8.7*  --     GFR: Estimated Creatinine Clearance: 21.3 mL/min (A) (by C-G formula based on SCr of 1.3 mg/dL (H)).  Liver Function Tests: Recent Labs  Lab 08/13/20 1215  AST 26  ALT 15  ALKPHOS 92  BILITOT 0.7  PROT 6.3*  ALBUMIN 2.9*    Urine analysis:    Component Value Date/Time   COLORURINE STRAW (A) 08/13/2020 1209   APPEARANCEUR CLEAR 08/13/2020 1209   APPEARANCEUR Hazy (A) 02/09/2020 1400   LABSPEC 1.026 08/13/2020 1209   PHURINE 8.0 08/13/2020 1209   GLUCOSEU NEGATIVE 08/13/2020 1209   [place] 08/13/2020 [place] 08/13/2020 1209   BILIRUBINUR Negative 02/09/2020 1400   KETONESUR NEGATIVE 08/13/2020 1209   PROTEINUR NEGATIVE 08/13/2020 1209   NITRITE NEGATIVE 08/13/2020 1209   LEUKOCYTESUR NEGATIVE 08/13/2020 1209    Radiological Exams on Admission: CT Head Wo Contrast  Result Date: 08/13/2020 CLINICAL DATA:  Altered mental status since 9 p.m. last night. EXAM: CT HEAD WITHOUT CONTRAST TECHNIQUE: Contiguous axial images were obtained from the base of the skull through the vertex without intravenous contrast. COMPARISON:  Head CT 03/09/2020. FINDINGS: Brain: No evidence of acute infarction, hemorrhage, hydrocephalus, extra-axial collection or mass lesion/mass effect. Atrophy and chronic microvascular ischemic disease are unchanged in appearance. Vascular: No hyperdense vessel or unexpected calcification. Skull: Intact.  No focal lesion. Sinuses/Orbits: Status post cataract surgery.  Otherwise negative. Other: None. IMPRESSION: No acute abnormality. Atrophy and chronic microvascular ischemic disease. Electronically Signed   By: Inge Rise M.D.   On: 08/13/2020 12:16   CT CEREBRAL PERFUSION W CONTRAST  Result Date: 08/13/2020 CLINICAL DATA:  Speech disturbance. EXAM: CT ANGIOGRAPHY HEAD AND NECK CT PERFUSION BRAIN TECHNIQUE: Multidetector CT imaging of the head and neck was performed using the standard protocol during bolus administration of intravenous contrast. Multiplanar CT image reconstructions and MIPs were obtained to evaluate the vascular anatomy. Carotid stenosis measurements (when applicable) are obtained utilizing NASCET criteria, using the distal internal carotid diameter as the denominator. Multiphase CT imaging of the brain was performed following IV bolus contrast injection. Subsequent parametric perfusion maps were calculated using RAPID software. CONTRAST:  18mL OMNIPAQUE IOHEXOL 350 MG/ML SOLN COMPARISON:  None. FINDINGS: CTA NECK FINDINGS Aortic arch: Standard 3  vessel aortic arch with mild atherosclerotic plaque and widely patent arch vessel  origins. Right carotid system: Patent with mild-to-moderate calcified and soft plaque at the carotid bifurcation not resulting in significant stenosis. Left carotid system: Patent with mild-to-moderate calcified plaque at the carotid bifurcation not resulting in significant stenosis. Vertebral arteries: Patent without evidence of stenosis or dissection. Mildly to moderately dominant left vertebral artery. Skeleton: Advanced cervical disc degeneration with evidence of severe spinal and neural foraminal stenosis at C5-6. Other neck: No evidence of cervical lymphadenopathy or mass. Upper chest: Motion artifact through the lung apices with scattered small ground-glass and nodular densities in the included portions of the upper lobes likely related to the suspected bilateral upper lobe pneumonia on 08/04/2020 chest radiographs. Review of the MIP images confirms the above findings CTA HEAD FINDINGS Anterior circulation: The internal carotid arteries are patent from skull base to carotid termini with mild atherosclerotic plaque bilaterally not resulting in significant stenosis. There is occlusion of the left M1 segment with good distal collateralization. The right MCA and both ACAs are patent without evidence of a significant proximal stenosis. No aneurysm is identified. Posterior circulation: Intracranial vertebral arteries are widely patent to the basilar. Patent PICA, AICA, and SCA origins are seen bilaterally with the right PICA and right AICA sharing a common trunk. The basilar artery is widely patent. There are right larger than left posterior communicating arteries with a fetal origin of the right PCA. Both PCAs are patent without evidence of a significant proximal stenosis. No aneurysm is identified. Venous sinuses: Patent. Anatomic variants: Fetal right PCA. Review of the MIP images confirms the above findings CT Brain Perfusion  Findings: ASPECTS: 10 CBF (<30%) Volume: 0 mL Perfusion (Tmax>6.0s) volume: 26 mL Mismatch Volume: 26 mL Infarction Location: No core infarct identified by CTP. Ischemic penumbra in the left MCA territory primarily in the left temporoparietal junction region. IMPRESSION: 1. 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 2. 26 mL ischemic penumbra in the left MCA territory without evidence of a core infarct. 3. Mild-to-moderate cervical carotid atherosclerosis without significant stenosis. 4. Aortic Atherosclerosis (ICD10-I70.0). These results were called by telephone at the time of interpretation on 08/13/2020 at 1:12 p.m. to Dr. Donnetta Simpers, who verbally acknowledged these results. Electronically Signed   By: Logan Bores M.D.   On: 08/13/2020 13:31   CT ANGIO HEAD CODE STROKE  Result Date: 08/13/2020 CLINICAL DATA:  Speech disturbance. EXAM: CT ANGIOGRAPHY HEAD AND NECK CT PERFUSION BRAIN TECHNIQUE: Multidetector CT imaging of the head and neck was performed using the standard protocol during bolus administration of intravenous contrast. Multiplanar CT image reconstructions and MIPs were obtained to evaluate the vascular anatomy. Carotid stenosis measurements (when applicable) are obtained utilizing NASCET criteria, using the distal internal carotid diameter as the denominator. Multiphase CT imaging of the brain was performed following IV bolus contrast injection. Subsequent parametric perfusion maps were calculated using RAPID software. CONTRAST:  111mL OMNIPAQUE IOHEXOL 350 MG/ML SOLN COMPARISON:  None. FINDINGS: CTA NECK FINDINGS Aortic arch: Standard 3 vessel aortic arch with mild atherosclerotic plaque and widely patent arch vessel origins. Right carotid system: Patent with mild-to-moderate calcified and soft plaque at the carotid bifurcation not resulting in significant stenosis. Left carotid system: Patent with mild-to-moderate calcified plaque at the carotid bifurcation not resulting in  significant stenosis. Vertebral arteries: Patent without evidence of stenosis or dissection. Mildly to moderately dominant left vertebral artery. Skeleton: Advanced cervical disc degeneration with evidence of severe spinal and neural foraminal stenosis at C5-6. Other neck: No evidence of cervical lymphadenopathy or mass. Upper chest: Motion artifact  through the lung apices with scattered small ground-glass and nodular densities in the included portions of the upper lobes likely related to the suspected bilateral upper lobe pneumonia on 08/04/2020 chest radiographs. Review of the MIP images confirms the above findings CTA HEAD FINDINGS Anterior circulation: The internal carotid arteries are patent from skull base to carotid termini with mild atherosclerotic plaque bilaterally not resulting in significant stenosis. There is occlusion of the left M1 segment with good distal collateralization. The right MCA and both ACAs are patent without evidence of a significant proximal stenosis. No aneurysm is identified. Posterior circulation: Intracranial vertebral arteries are widely patent to the basilar. Patent PICA, AICA, and SCA origins are seen bilaterally with the right PICA and right AICA sharing a common trunk. The basilar artery is widely patent. There are right larger than left posterior communicating arteries with a fetal origin of the right PCA. Both PCAs are patent without evidence of a significant proximal stenosis. No aneurysm is identified. Venous sinuses: Patent. Anatomic variants: Fetal right PCA. Review of the MIP images confirms the above findings CT Brain Perfusion Findings: ASPECTS: 10 CBF (<30%) Volume: 0 mL Perfusion (Tmax>6.0s) volume: 26 mL Mismatch Volume: 26 mL Infarction Location: No core infarct identified by CTP. Ischemic penumbra in the left MCA territory primarily in the left temporoparietal junction region. IMPRESSION: 1. 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 2. 26 mL ischemic  penumbra in the left MCA territory without evidence of a core infarct. 3. Mild-to-moderate cervical carotid atherosclerosis without significant stenosis. 4. Aortic Atherosclerosis (ICD10-I70.0). These results were called by telephone at the time of interpretation on 08/13/2020 at 1:12 p.m. to Dr. Donnetta Simpers, who verbally acknowledged these results. Electronically Signed   By: Logan Bores M.D.   On: 08/13/2020 13:31   CT ANGIO NECK CODE STROKE  Result Date: 08/13/2020 CLINICAL DATA:  Speech disturbance. EXAM: CT ANGIOGRAPHY HEAD AND NECK CT PERFUSION BRAIN TECHNIQUE: Multidetector CT imaging of the head and neck was performed using the standard protocol during bolus administration of intravenous contrast. Multiplanar CT image reconstructions and MIPs were obtained to evaluate the vascular anatomy. Carotid stenosis measurements (when applicable) are obtained utilizing NASCET criteria, using the distal internal carotid diameter as the denominator. Multiphase CT imaging of the brain was performed following IV bolus contrast injection. Subsequent parametric perfusion maps were calculated using RAPID software. CONTRAST:  175mL OMNIPAQUE IOHEXOL 350 MG/ML SOLN COMPARISON:  None. FINDINGS: CTA NECK FINDINGS Aortic arch: Standard 3 vessel aortic arch with mild atherosclerotic plaque and widely patent arch vessel origins. Right carotid system: Patent with mild-to-moderate calcified and soft plaque at the carotid bifurcation not resulting in significant stenosis. Left carotid system: Patent with mild-to-moderate calcified plaque at the carotid bifurcation not resulting in significant stenosis. Vertebral arteries: Patent without evidence of stenosis or dissection. Mildly to moderately dominant left vertebral artery. Skeleton: Advanced cervical disc degeneration with evidence of severe spinal and neural foraminal stenosis at C5-6. Other neck: No evidence of cervical lymphadenopathy or mass. Upper chest: Motion  artifact through the lung apices with scattered small ground-glass and nodular densities in the included portions of the upper lobes likely related to the suspected bilateral upper lobe pneumonia on 08/04/2020 chest radiographs. Review of the MIP images confirms the above findings CTA HEAD FINDINGS Anterior circulation: The internal carotid arteries are patent from skull base to carotid termini with mild atherosclerotic plaque bilaterally not resulting in significant stenosis. There is occlusion of the left M1 segment with good distal collateralization. The right  MCA and both ACAs are patent without evidence of a significant proximal stenosis. No aneurysm is identified. Posterior circulation: Intracranial vertebral arteries are widely patent to the basilar. Patent PICA, AICA, and SCA origins are seen bilaterally with the right PICA and right AICA sharing a common trunk. The basilar artery is widely patent. There are right larger than left posterior communicating arteries with a fetal origin of the right PCA. Both PCAs are patent without evidence of a significant proximal stenosis. No aneurysm is identified. Venous sinuses: Patent. Anatomic variants: Fetal right PCA. Review of the MIP images confirms the above findings CT Brain Perfusion Findings: ASPECTS: 10 CBF (<30%) Volume: 0 mL Perfusion (Tmax>6.0s) volume: 26 mL Mismatch Volume: 26 mL Infarction Location: No core infarct identified by CTP. Ischemic penumbra in the left MCA territory primarily in the left temporoparietal junction region. IMPRESSION: 1. 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 2. 26 mL ischemic penumbra in the left MCA territory without evidence of a core infarct. 3. Mild-to-moderate cervical carotid atherosclerosis without significant stenosis. 4. Aortic Atherosclerosis (ICD10-I70.0). These results were called by telephone at the time of interpretation on 08/13/2020 at 1:12 p.m. to Dr. Donnetta Simpers, who verbally acknowledged  these results. Electronically Signed   By: Logan Bores M.D.   On: 08/13/2020 13:31    EKG: Independently reviewed. Positive rate controlled atrial fibrillation; no acute ischemic changes.  Left ventricle hypertrophy by voltage appreciated.  Unchanged from previous tracing.  Assessment/Plan 1-acute left MCA stroke -Risk factors include age, hypertension, atrial fibrillation -Unable to use anticoagulation secondary to life-threatening GI bleed in the past. -After discussing with patient's daughter at bedside she declines the use of aspirin as have been previously informed not to use any NSAIDs. -Patient's family has also declined the use of heparin products for DVT prophylaxis. -Will check A1c, lipid panel, 2D echo and carotid Dopplers. -Unable to proceed with MRI evaluation secondary to pacemaker implantation. -Will further discuss with neurology service for alternatives. -Allow permissive hypertension in the next 24 hours due to acute ischemic changes. -Physical therapy, Occupational Therapy and speech therapy ordered.  2-essential hypertension -Allowing permissive hypertension secondary to acute ischemic changes. -Follow vital signs.  3-tachybradycardia syndrome/PAF (paroxysmal atrial fibrillation) (HCC) -S/p pacemaker implantation -Continue low-dose metoprolol. -Not on anticoagulation secondary to life-threatening GI bleed in the past.  4-COPD (chronic obstructive pulmonary disease) (HCC) -No wheezing currently -Patient denies shortness of breath. -Continue home inhaler regimen.  5-Dementia without behavioral disturbance (HCC) -Continue supportive care and assistance -Constant reorientation -Continue the use of Namenda  6-GERD/Upper GI bleed -Continue PPI and Pepcid.  7-CKD (chronic kidney disease) stage 3, GFR 30-59 ml/min (HCC) -Appears to be stable and at baseline currently -Continue to follow renal function trend. -Minimize contrast, hypotension and nephrotoxic  agents.  8-hypothyroidism -Continue Synthroid.  9-DNR/DNI -CODE STATUS clarified with patient's daughter at bedside -DNR form presented -Wishes will be respected and granted.   DVT prophylaxis: SCD's Code Status:   DNR/DNI Family Communication:  Daughter at bedside. Disposition Plan:   Patient is from:  home  Anticipated DC to:  To be determined.  Anticipated DC date:  1-2 days  Anticipated DC barriers: Stroke work up completion and further neurology recommendations.  Consults called:  Neurology  Admission status:  Observation, LOS < 2 midnights, telemetry bed.  Severity of Illness: Moderate severity with risk for decompensations. High risk for re-stroke. Admitted with acute left MCA stroke to complete work up.    Barton Dubois MD [facility]  How to  contact the [facility] Attending or Consulting provider [place] or covering provider during after hours [place], for this patient?   1. Check the care team in [facility] and look for a) attending/consulting [facility] provider listed and b) the [facility] team listed 2. Log into [email or web address] and use [place]'s universal password to access. If you do not have the password, please contact the hospital operator. 3. Locate the [facility] provider you are looking for under [facility] and page to a number that you can be directly reached. 4. If you still have difficulty reaching the provider, please page the [facility] (Director on Call) for the Hospitalists listed on amion for assistance.  08/13/2020, 5:24 PM

## 2020-08-13 NOTE — Consult Note (Signed)
[facility] TELEMEDICINE  CONSULT   Date of service: August 13, 2020 Patient Name: Crystal Cooper MRN:  [id number] DOB:  02-17-1927 Requesting Provider: Dr. Dina Rich Location of the provider: [facility] Neurohospitalist office Location of the patient: [facility] Reason for consult: "Stroke code"  This consult was provided via telemedicine with 2-way video and audio communication. The patient/family was informed that care would be provided in this way and agreed to receive care in this manner.   History of Present Illness  Crystal Cooper is a 87 y.o. female with PMH significant for prior hx of stroke with no significant residual deficit, hx of CKD 3b, afibb who was recently off Eliquis due to GI bleed with plan to resume Eliquis soon. Hx of dementia, glaucoma, pacemaker placement, mitral valve prolapse who presents with confusion and not following commands. She was seen by her daughter at 48 on 08/13/20 and had breakfast. Daughter noted that she was slow to respond. She was able to walk with a walker. Shelast wasseen at her baseline by caretaker at 38 when she was ableto stand by herself. She seemed to be not muc interactive when daughter left at 10AM. About 0630, was having some problems, was having hard time getting her up. While daugther was over there, they were able to get her up. She was sitting on her chair and she ate breakfast. Family left at 1000 and she was sluggish but seemed to be doing good. About 11AM, she was leaning to the right and would not get up and seemed to be not responding a lot.  At 1040 AM, caretaker attempted to get her out of her chair and she could not follow commands to get into her lift chair. When they tried to get her up and she was unable to get up and slumped over to the right.  They called EMS and she was brought in to the hospital.  Stroke Measures   Last Known Well: Unclear. Initially reported by family at 79. Caretaker clarified  that it was 0745. TPA Given: No, outside the tPA window by the time of [facility] interpretation. IR Thrombectomy: No, poor baseline mRS. mRS: Modified Rankin Scale: 4-Needs assistance to walk and tend to bodily needs Time of teleneurologist evaluation: 13   Vitals   Vitals:   08/13/20 1157 08/13/20 1200  BP:  129/72  Pulse:  62  Resp:  (!) 21  SpO2:  99%  Weight: 52.1 kg   Height: 5\' 1"  (1.549 m)      Body mass index is 21.69 kg/m.   Tele-neuro Exam and NIHSS     NIHSS components Score: Comment  1a Level of Conscious 0[]  1[x]  2[]  3[]      1b LOC Questions 0[x]  1[]  2[]       1c LOC Commands 0[]  1[]  2[x]       2 Best Gaze 0[x]  1[]  2[]       3 Visual 0[x]  1[]  2[]  3[]      4 Facial Palsy 0[]  1[x]  2[]  3[]      5a Motor Arm - left 0[x]  1[]  2[]  3[]  4[]  UN[]    5b Motor Arm - Right 0[x]  1[]  2[]  3[]  4[]  UN[]    6a Motor Leg - Left 0[]  1[x]  2[]  3[]  4[]  UN[]    6b Motor Leg - Right 0[]  1[x]  2[]  3[]  4[]  UN[]    7 Limb Ataxia 0[]  1[x]  2[]  3[]  UN[]     8 Sensory 0[x]  1[]  2[]  UN[]      9 Best Language 0[]  1[x]  2[]   3[]      10 Dysarthria 0[x]  1[]  2[]  UN[]      11 Extinct. and Inattention 0[]  1[x]  2[]       TOTAL: 8     Imaging and Labs   CBC: No results for input(s): WBC, NEUTROABS, HGB, HCT, MCV, PLT in the last 168 hours.  Basic Metabolic Panel:  Lab Results  Component Value Date   NA 133 (L) 08/04/2020   K 4.1 08/04/2020   CO2 27 08/04/2020   GLUCOSE 139 (H) 08/04/2020   BUN 24 08/04/2020   CREATININE 1.27 (H) 08/04/2020   CALCIUM 8.5 (L) 08/04/2020   GFRNONAA 37 (L) 08/04/2020   GFRAA 43 (L) 08/04/2020   Lipid Panel:  Lab Results  Component Value Date   LDLCALC 51 09/29/2019   HgbA1c:  Lab Results  Component Value Date   HGBA1C 5.3 08/27/2019   Urine Drug Screen: No results found for: LABOPIA, COCAINSCRNUR, LABBENZ, AMPHETMU, THCU, LABBARB  Alcohol Level No results found for: [facility]  Results for orders placed during the hospital encounter of 03/09/20  CT HEAD WO  CONTRAST  Narrative CLINICAL DATA:  Head trauma, minor. Poly trauma, critical, head/cervical spine injury suspected. Additional history obtained from [place] slid out of bed and hit left side of head, bruising noted to left side of forehead, patient reports left leg pain.  EXAM: CT HEAD WITHOUT CONTRAST  CT CERVICAL SPINE WITHOUT CONTRAST  TECHNIQUE: Multidetector CT imaging of the head and cervical spine was performed following the standard protocol without intravenous contrast. Multiplanar CT image reconstructions of the cervical spine were also generated.  COMPARISON:  Brain MRI 12/02/2019.  Head CT 08/25/2019.  FINDINGS: CT HEAD FINDINGS  Brain:  Stable, moderate generalized parenchymal atrophy.  Known chronic lacunar infarcts within the right corona radiata, basal ganglia, thalami and bilateral cerebellar hemispheres, some of which were better appreciated on the MRI of 12/02/2019.  There is no acute intracranial hemorrhage.  No demarcated cortical infarct.  No extra-axial fluid collection.  No evidence of intracranial mass.  No midline shift.  Vascular: No hyperdense vessel.  Atherosclerotic calcifications  Skull: Normal. Negative for fracture or focal lesion.  Sinuses/Orbits: Visualized orbits show no acute finding. Scattered mild mucosal thickening and frothy secretions within the ethmoid air cells. No significant mastoid effusion.  Other: Mild left frontal scalp/forehead soft tissue swelling.  CT CERVICAL SPINE FINDINGS  Mildly motion degraded examination  Alignment: Cervical levocurvature. Straightening of the expected cervical lordosis. No significant spondylolisthesis.  Skull base and vertebrae: The basion-dental and atlanto-dental intervals are maintained.No evidence of acute fracture to the cervical spine.  Soft tissues and spinal canal: No prevertebral fluid or swelling. No visible canal hematoma.  Disc  levels: Cervical spondylosis with multilevel disc space narrowing, posterior disc osteophytes, uncovertebral and facet hypertrophy. A prominent C5-C6 posterior disc osteophyte complex contributes to severe bony spinal canal stenosis  Upper chest: No consolidation within the imaged lung apices. No visible pneumothorax.  IMPRESSION: CT head:  1. No evidence of acute intracranial abnormality. 2. Mild left frontal scalp/forehead soft tissue swelling. 3. Stable moderate generalized parenchymal atrophy and advanced chronic small vessel ischemic disease with multiple chronic lacunar infarcts as described. 4. Ethmoid sinusitis.  CT angio head and neck: 1. 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 2. 26 mL ischemic penumbra in the left MCA territory without evidence of a core infarct.  CT perfusion: 50ml mismatch.   Impression   Crystal Cooper is a 87 y.o. female with PMH significant  for prior hx of stroke with no significant residual deficit, hx of CKD 3b, afibb who was recently off Eliquis due to GI bleed with plan to resume Eliquis soon. Hx of dementia, glaucoma, pacemaker placement, mitral valve prolapse who presents with confusion and not following commands. Her limited tele-neurologic examination is notable for confusion vs aphasia with mild L facial droop. CT angio with L M1 occlusion with good colateral flow. CTP with a mismatch of 26 ml.  At initial presentation, LKW was thought to be 2100 on 08/12/20. On clarification with patient's daughter, this was around 48. On further discussion with patient's care provider, this was clarified to be 0745. Unfortunately given the extensive nature of conversation(about an hour) just to clarify the LKW, resulted in patient being outside the window for 4.5 hours for tPA. She is not a candidate for thrombectomy due to poor functional baseline require 24/7 care with most of her bodily needs.  Recommendations  Plan:  Acute L MCA  stroke: Recommend that primary team order following: - Frequent Neuro checks per stroke unit protocol - Recommend brain imaging with MRI Brain without contrast - Recommend Vascular imaging with MRA Angio Head without contrast and US Carotid doppler - Recommend obtaining TTE - Recommend obtaining Lipid panel with LDL - Please start statin if LDL > 70 - Recommend HbA1c - Antithrombotic - Aspirin 81mg daily. - Recommend DVT ppx - SBP goal - permissive hypertension first 24 h < 220/110. Hold home meds. Gradual normotension after that. - Recommend Telemetry monitoring for arrythmia - Recommend bedside swallow screen prior to PO intake. - Stroke education booklet - Recommend PT/OT/SLP consult  ______________________________________________________________________    This patient is receiving care for possible acute neurological changes. There was 70 minutes of care by this provider at the time of service, including time for direct evaluation via telemedicine, review of medical records, imaging studies and discussion of findings with providers, the patient and/or family.  Donnetta Simpers [facility] Pager Number [phone]  If 7pm- 7am, please page neurology on call as listed in [place].

## 2020-08-13 NOTE — ED Provider Notes (Signed)
[facility] EMERGENCY DEPARTMENT Provider Note   CSN: [id number] Arrival date & time: 08/13/20  1149  An emergency department physician performed an initial assessment on this suspected stroke patient at 1202.  History Chief Complaint  Patient presents with  . Weakness    Crystal Cooper is a 87 y.o. female.  HPI   87 year old female with past medical history of HTN, HLD, atrial fibrillation with a pacemaker in place not on anticoagulation (had a complication with a GI bleed) presents the emergency department with concern for stroke.  On arrival patient is aphasic, leaning to the right, seems to be neglecting the right, stroke page was placed.  Initial reported last known normal was 9 PM last night, approximately 15 hours ago.  Head CT did not show any acute pathology, teleneurology is at bedside.  Past Medical History:  Diagnosis Date  . Acute CVA (cerebrovascular accident) ([place]) 08/27/2019  . Acute GI bleeding 06/22/2020  . Acute renal failure superimposed on stage 3b chronic kidney disease ([place])   . AKI (acute kidney injury) ([place])   . Anemia    years ago  . Angio-edema, initial encounter 08/23/2019  . Aortic stenosis   . Arthritis   . Asthma   . Atrial fibrillation ([place])   . Blood transfusion without reported diagnosis   . CHF (congestive heart failure) ([place]) 11/2014  . CKD (chronic kidney disease)    had many uti this year   . Closed left hip fracture, initial encounter ([place]) 03/09/2020  . Dementia ()   . Family history of adverse reaction to anesthesia    2 daughters would have N/V  . Gastric AVM   . GERD (gastroesophageal reflux disease)    was seen by Gi for bleeding ulcer which was cauterized   . GI bleed   . Glaucoma   . Hearing loss   . Heart murmur    Rheumatic fever whe she was young,  sees Dr. Stanford Breed in [place]  . HTN (hypertension)   . Hypokalemia   . Myocardial infarction ([place])    in her 27 when she had two heart attacks   . PNA (pneumonia)  08/25/2019  . Pneumonia    stayed from friday to sunday in august  . Presence of permanent cardiac pacemaker    placed about 4 years ago  . Prolapsing mitral leaflet syndrome   . Shortness of breath dyspnea    with exertion  . Stroke ([place])   . SVT (supraventricular tachycardia) (HCC)    S/P ablation of AVNRT in 2003    Patient Active Problem List   Diagnosis Date Noted  . Rectal bleeding 07/07/2020  . Impairment of balance 06/28/2020  . Status post hip surgery   . Upper GI bleed 02/19/2020  . Acute blood loss anemia 02/19/2020  . CKD (chronic kidney disease) stage 3, GFR 30-59 ml/min (HCC) 02/19/2020  . Hematochezia 02/19/2020  . Iron deficiency anemia due to chronic blood loss 02/18/2020  . AVM (arteriovenous malformation) 02/18/2020  . COPD (chronic obstructive pulmonary disease) ([place]) 02/18/2020  . Dementia without behavioral disturbance ([place]) 02/18/2020  . SOB (shortness of breath)   . COPD with acute exacerbation ([employer])   . CAP (community acquired pneumonia) 02/12/2020  . Long term (current) use of anticoagulants   . Wheezing 09/23/2019  . Abnormal breath sounds 09/09/2019  . Hypokalemia 08/23/2019  . Reactive airway disease 03/24/2019  . Gastroesophageal reflux disease 03/24/2019  . GI bleed 05/12/2018  . 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 Diastolic dysfunction 16/03/9603  . Pacemaker 05/12/2018  . Iron deficiency anemia   . Melena   . Duodenal arteriovenous malformation   . Gastritis and gastroduodenitis   . Normocytic anemia 04/06/2018  . Osteoporosis 08/27/2016  . ASCVD (arteriosclerotic cardiovascular disease) 01/11/2016  . Asthma 01/11/2016  . Glaucoma 01/11/2016  . Hearing loss 01/11/2016  . Other nonrheumatic mitral valve disorders 01/11/2016  . Junctional bradycardia   . PAF (paroxysmal atrial fibrillation) ([place]) 09/06/2015  . Hyponatremia 12/17/2014  . Congestive heart failure ([place]) 12/12/2014  . Murmur 10/19/2011  .  Essential hypertension 10/19/2011  . History of cardiac radiofrequency ablation (RFA) 10/19/2011    Past Surgical History:  Procedure Laterality Date  . APPENDECTOMY    . BIOPSY  04/07/2018   Procedure: BIOPSY;  Surgeon: Jerene Bears, MD;  Location: [facility] ENDOSCOPY;  Service: Gastroenterology;;  . BIOPSY  02/19/2020   Procedure: BIOPSY;  Surgeon: Harvel Quale, MD;  Location: [facility] ENDO SU[facility];  Service: Gastroenterology;;  gastric   . BLADDER SURGERY    . CARDIAC CATHETERIZATION N/A 09/26/2015   Procedure: Right/Left Heart Cath and Coronary Angiography;  Surgeon: Burnell Blanks, MD;  Location: [place] CV LAB;  Service: Cardiovascular;  Laterality: N/A;  . CARDIAC SURGERY    . CARDIOVERSION N/A 03/02/2016   Procedure: CARDIOVERSION;  Surgeon: Thayer Headings, MD;  [facility] END[facility]COPY;  Service: Cardiovascular;  Laterality: N/A;  . ENTEROSCOPY  02/19/2020   Procedure: ENTEROSCOPY;  Surgeon: Harvel Quale, MD;  Location: [facility] ENDO SUITE;  Service: Gastroenterology;;  . EP IMPLANTABLE DEVICE N/A 10/26/2015   Procedure: Pacemaker Implant;  Surgeon: Will Meredith Leeds, MD;  Location: [place] CV LAB;  Service: Cardiovascular;  Laterality: N/A;  . ESOPHAGOGASTRODUODENOSCOPY (EGD) WITH PROPOFOL N/A 04/07/2018   Procedure: ESOPHAGOGASTRODUODENOSCOPY (EGD) WITH PROPOFOL;  Surgeon: Jerene Bears, MD;  Location: [facility] ENDOSCOPY;  Service: Gastroenterology;  Laterality: N/A;  . ESOPHAGOGASTRODUODENOSCOPY (EGD) WITH PROPOFOL N/A 10/12/2019   Procedure: ESOPHAGOGASTRODUODENOSCOPY (EGD) WITH PROPOFOL;  Surgeon: Danie Binder, MD;  [facility] ENDO [facility];  Service: Endoscopy;  Laterality: N/A;  . EYE SURGERY Bilateral    cataract surgery  . FLEXIBLE SIGMOIDOSCOPY  02/19/2020   Procedure: FLEXIBLE SIGMOIDOSCOPY;  Surgeon: Harvel Quale, MD;  [facility];  Service: Gastroenterology;;  . HIP FRACTURE SURGERY Left 02/2020  . HOT HEMOSTASIS N/A 04/07/2018    Procedure: HOT HEMOSTASIS (ARGON PLASMA COAGULATION/BICAP);  Surgeon: Jerene Bears, MD;  Location: [facility] ENDOSCOPY;  Service: Gastroenterology;  Laterality: N/A;  . HOT HEMOSTASIS  10/12/2019   Procedure: HOT HEMOSTASIS (ARGON PLASMA COAGULATION/BICAP);  Surgeon: Danie Binder, MD;  Location: [facility] ENDO [facility];  Service: Endoscopy;;  . INTRAMEDULLARY (IM) NAIL INTERTROCHANTERIC Left 03/10/2020   Procedure: OPEN TREATMENT INTERNAL FIXATION LEFT HIP (WITH GAMMA NAIL);  Surgeon: Carole Civil, MD;  Location: [facility] ORS;  Service: Orthopedics;  Laterality: Left;  . NASAL SINUS SURGERY    . PACEMAKER INSERTION    . TEE WITHOUT CARDIOVERSION N/A 10/25/2015   Procedure: TRANSESOPHAGEAL ECHOCARDIOGRAM (TEE);  Surgeon: Burnell Blanks, MD;  Location: [place];  Service: Open Heart Surgery;  Laterality: N/A;  . TRANSCATHETER AORTIC VALVE REPLACEMENT, TRANSFEMORAL Right 10/25/2015   Procedure: TRANSCATHETER AORTIC VALVE REPLACEMENT, TRANSFEMORAL;  Surgeon: Burnell Blanks, MD;  Location: [place];  Service: Open Heart Surgery;  Laterality: Right;     OB History    Gravida  3   Para  3   Term  3  Preterm      AB      Living  2     SAB      IAB      Ectopic      Multiple      Live Births              Family History  Problem Relation Age of Onset  . Hypertension Mother   . Pneumonia Father   . Stomach cancer Brother   . Stroke Brother   . AAA (abdominal aortic aneurysm) Brother   . Cervical cancer Daughter   . Heart attack Neg Hx   . Colon cancer Neg Hx   . Esophageal cancer Neg Hx     Social History   Tobacco Use  . Smoking status: Never Smoker  . Smokeless tobacco: Never Used  Vaping Use  . Vaping Use: Never used  Substance Use Topics  . Alcohol use: No    Alcohol/week: 0.0 standard drinks  . Drug use: No    Home Medications Prior to Admission medications   Medication Sig Start Date End Date Taking? Authorizing Provider  albuterol (PROAIR HFA) 108 (90 Base)  MCG/ACT inhaler Inhale 2 puffs into the lungs every 4 (four) hours as needed for wheezing or shortness of breath. 08/05/19   Baruch Gouty, FNP  azithromycin (ZITHROMAX Z-PAK) 250 MG tablet As directed 08/05/20   Hassell Done, Mary-Margaret, FNP  Biotin 10 MG CAPS Take 10 mg by mouth daily.     [provider]  Budeson-Glycopyrrol-Formoterol (BREZTRI AEROSPHERE) 160-9-4.8 MCG/ACT AERO Inhale 2 Inhalers into the lungs in the morning and at bedtime. 11/27/19   Ivy Lynn, NP  Calcium Carb-Cholecalciferol (CALTRATE 600+D3) 600-800 MG-UNIT TABS Take 1 tablet by mouth daily.    [provider]  docusate sodium (COLACE) 100 MG capsule Take 1 capsule (100 mg total) by mouth 2 (two) times daily. 03/15/20   Barton Dubois, MD  EUTHYROX 25 MCG tablet Take 1 tablet by mouth once daily 03/07/20   [name], MD  famotidine (PEPCID) 20 MG tablet Take 1 tablet (20 mg total) by mouth at bedtime. 12/21/19 12/20/20  [name], MD  folic acid (FOLVITE) 1 MG tablet Take 1 tablet (1 mg total) by mouth daily. 06/21/20   Erlene Quan, PA-C  furosemide (LASIX) 20 MG tablet Take 4 tablets (80 mg total) by mouth daily. 02/22/20   Lelon Perla, MD  iron polysaccharides (NIFEREX) 150 MG capsule Take 1 capsule (150 mg total) by mouth daily. 06/29/20   Lelon Perla, MD  levalbuterol Penne Lash) 1.25 MG/3ML nebulizer solution Take 1.25 mg by nebulization every 4 (four) hours as needed for wheezing. Dx L79.892 07/07/20   [name], MD  memantine (NAMENDA) 10 MG tablet Take 1 tablet (10 mg total) by mouth 2 (two) times daily. 07/26/20   Frann Rider, NP  metoprolol tartrate (LOPRESSOR) 25 MG tablet Take 1 tablet (25 mg total) by mouth 2 (two) times daily. 05/20/19   Camnitz, Will Hassell Done, MD  pantoprazole (PROTONIX) 40 MG tablet Take 1 tablet by mouth twice daily 07/13/20   [name], MD  potassium chloride SA (KLOR-CON) 10 MEQ tablet Take 2 tablets (20 mEq total) by mouth 2 (two) times  daily. Patient taking differently: Take 10 mEq by mouth 2 (two) times daily. 02/09/20   [name], MD    Allergies    Hctz [hydrochlorothiazide], Aspirin, and Codeine  Review of Systems   Review of Systems  Unable to  perform ROS: Patient nonverbal    Physical Exam Updated Vital Signs BP 129/72   Pulse 62   Resp (!) 21   Ht 5\' 1"  (1.549 m)   Wt 52.1 kg   SpO2 99%   BMI 21.69 kg/m   Physical Exam Vitals and nursing note reviewed.  Constitutional:      Appearance: Normal appearance.  HENT:     Head: Normocephalic.     Mouth/Throat:     Mouth: Mucous membranes are moist.  Eyes:     Pupils: Pupils are equal, round, and reactive to light.  Cardiovascular:     Rate and Rhythm: Normal rate.  Pulmonary:     Effort: Pulmonary effort is normal. No respiratory distress.  Abdominal:     Palpations: Abdomen is soft.     Tenderness: There is no abdominal tenderness.  Musculoskeletal:        General: No deformity.  Skin:    General: Skin is warm.  Neurological:     Mental Status: She is alert.     Comments: Please see NIH  Psychiatric:        Mood and Affect: Mood normal.     ED Results / Procedures / Treatments   Labs (all labs ordered are listed, but only abnormal results are displayed) Labs Reviewed  CBG MONITORING, ED - Abnormal; Notable for the following components:      Result Value   Glucose-Capillary 114 (*)    All other components within normal limits  RESP PANEL BY RT-PCR (FLU A&B, COVID) ARPGX2  ETHANOL  PROTIME-INR  APTT  CBC  DIFFERENTIAL  COMPREHENSIVE METABOLIC PANEL  RAPID URINE DRUG SCREEN, HOSP PERFORMED  URINALYSIS, ROUTINE W REFLEX MICROSCOPIC  I-STAT CHEM 8, ED    EKG None  Radiology CT Head Wo Contrast  Result Date: 08/13/2020 CLINICAL DATA:  Altered mental status since 9 p.m. last night. EXAM: CT HEAD WITHOUT CONTRAST TECHNIQUE: Contiguous axial images were obtained from the base of the skull through the vertex without  intravenous contrast. COMPARISON:  Head CT 03/09/2020. FINDINGS: Brain: No evidence of acute infarction, hemorrhage, hydrocephalus, extra-axial collection or mass lesion/mass effect. Atrophy and chronic microvascular ischemic disease are unchanged in appearance. Vascular: No hyperdense vessel or unexpected calcification. Skull: Intact.  No focal lesion. Sinuses/Orbits: Status post cataract surgery.  Otherwise negative. Other: None. IMPRESSION: No acute abnormality. Atrophy and chronic microvascular ischemic disease. Electronically Signed   By: Inge Rise M.D.   On: 08/13/2020 12:16    Procedures .Critical Care Performed by: Lorelle Gibbs, DO Authorized by: Lorelle Gibbs, DO   Critical care provider statement:    Critical care time (minutes):  45   Critical care was time spent personally by me on the following activities:  Discussions with consultants, evaluation of patient's response to treatment, examination of patient, ordering and performing treatments and interventions, ordering and review of laboratory studies, ordering and review of radiographic studies, pulse oximetry, re-evaluation of patient's condition, obtaining history from patient or surrogate and review of old charts   I assumed direction of critical care for this patient from another provider in my specialty: no       Medications Ordered in ED Medications  alteplase (ACTIVASE) 1 mg/mL injection (has no administration in time range)    ED Course  I have reviewed the triage vital signs and the nursing notes.  Pertinent labs & imaging results that were available during my care of the patient were reviewed by me and considered in  my medical decision making (see chart for details).    MDM Rules/Calculators/A&P                          87 year old female arrived to the emergency department, evaluated as a stroke page, neuro telemedicine at bedside.  She has a high NIH deficit, aphasic and appears to be neglecting  the right side.  Head CT showed no acute abnormality.  CTA does show a cut off at the M1.  However given patient's age, comorbidities and timeframe she is not a TPA/thrombectomy candidate.  Daughter is at bedside.  On reevaluation patient is much improved, able to localize to me on the right, has been saying words and phrases to the nurse.  Vitals are stable.  Patients evaluation and results requires admission for further treatment and care. Patient agrees with admission plan, offers no new complaints and is stable/unchanged at time of admit.  Final Clinical Impression(s) / ED Diagnoses Final diagnoses:  None    Rx / DC Orders ED Discharge Orders    None       Lorelle Gibbs, DO 08/13/20 1529

## 2020-08-14 ENCOUNTER — Observation Stay (HOSPITAL_COMMUNITY): Payer: Medicare Other

## 2020-08-14 DIAGNOSIS — H9193 Unspecified hearing loss, bilateral: Secondary | ICD-10-CM | POA: Diagnosis not present

## 2020-08-14 DIAGNOSIS — Z66 Do not resuscitate: Secondary | ICD-10-CM | POA: Diagnosis present

## 2020-08-14 DIAGNOSIS — K219 Gastro-esophageal reflux disease without esophagitis: Secondary | ICD-10-CM

## 2020-08-14 DIAGNOSIS — I480 Paroxysmal atrial fibrillation: Secondary | ICD-10-CM | POA: Diagnosis present

## 2020-08-14 DIAGNOSIS — Z20822 Contact with and (suspected) exposure to covid-19: Secondary | ICD-10-CM | POA: Diagnosis present

## 2020-08-14 DIAGNOSIS — Z950 Presence of cardiac pacemaker: Secondary | ICD-10-CM | POA: Diagnosis not present

## 2020-08-14 DIAGNOSIS — R4701 Aphasia: Secondary | ICD-10-CM | POA: Diagnosis present

## 2020-08-14 DIAGNOSIS — Z886 Allergy status to analgesic agent status: Secondary | ICD-10-CM | POA: Diagnosis not present

## 2020-08-14 DIAGNOSIS — Z823 Family history of stroke: Secondary | ICD-10-CM | POA: Diagnosis not present

## 2020-08-14 DIAGNOSIS — L8990 Pressure ulcer of unspecified site, unspecified stage: Secondary | ICD-10-CM | POA: Insufficient documentation

## 2020-08-14 DIAGNOSIS — I639 Cerebral infarction, unspecified: Secondary | ICD-10-CM

## 2020-08-14 DIAGNOSIS — I63512 Cerebral infarction due to unspecified occlusion or stenosis of left middle cerebral artery: Secondary | ICD-10-CM | POA: Diagnosis present

## 2020-08-14 DIAGNOSIS — R29810 Facial weakness: Secondary | ICD-10-CM | POA: Diagnosis present

## 2020-08-14 DIAGNOSIS — Z8049 Family history of malignant neoplasm of other genital organs: Secondary | ICD-10-CM | POA: Diagnosis not present

## 2020-08-14 DIAGNOSIS — Z79899 Other long term (current) drug therapy: Secondary | ICD-10-CM | POA: Diagnosis not present

## 2020-08-14 DIAGNOSIS — F0280 Dementia in other diseases classified elsewhere without behavioral disturbance: Secondary | ICD-10-CM | POA: Diagnosis not present

## 2020-08-14 DIAGNOSIS — Z885 Allergy status to narcotic agent status: Secondary | ICD-10-CM | POA: Diagnosis not present

## 2020-08-14 DIAGNOSIS — K254 Chronic or unspecified gastric ulcer with hemorrhage: Secondary | ICD-10-CM | POA: Diagnosis not present

## 2020-08-14 DIAGNOSIS — L89151 Pressure ulcer of sacral region, stage 1: Secondary | ICD-10-CM | POA: Diagnosis not present

## 2020-08-14 DIAGNOSIS — Z8249 Family history of ischemic heart disease and other diseases of the circulatory system: Secondary | ICD-10-CM | POA: Diagnosis not present

## 2020-08-14 DIAGNOSIS — M159 Polyosteoarthritis, unspecified: Secondary | ICD-10-CM | POA: Diagnosis not present

## 2020-08-14 DIAGNOSIS — D631 Anemia in chronic kidney disease: Secondary | ICD-10-CM | POA: Diagnosis present

## 2020-08-14 DIAGNOSIS — H409 Unspecified glaucoma: Secondary | ICD-10-CM | POA: Diagnosis present

## 2020-08-14 DIAGNOSIS — I252 Old myocardial infarction: Secondary | ICD-10-CM | POA: Diagnosis not present

## 2020-08-14 DIAGNOSIS — I6523 Occlusion and stenosis of bilateral carotid arteries: Secondary | ICD-10-CM | POA: Diagnosis not present

## 2020-08-14 DIAGNOSIS — Z8673 Personal history of transient ischemic attack (TIA), and cerebral infarction without residual deficits: Secondary | ICD-10-CM | POA: Diagnosis not present

## 2020-08-14 DIAGNOSIS — G8191 Hemiplegia, unspecified affecting right dominant side: Secondary | ICD-10-CM | POA: Diagnosis present

## 2020-08-14 DIAGNOSIS — R2681 Unsteadiness on feet: Secondary | ICD-10-CM | POA: Diagnosis not present

## 2020-08-14 DIAGNOSIS — M6281 Muscle weakness (generalized): Secondary | ICD-10-CM | POA: Diagnosis not present

## 2020-08-14 DIAGNOSIS — Z800 Family history of malignant neoplasm of digestive organs: Secondary | ICD-10-CM | POA: Diagnosis not present

## 2020-08-14 DIAGNOSIS — R531 Weakness: Secondary | ICD-10-CM | POA: Diagnosis not present

## 2020-08-14 DIAGNOSIS — Z888 Allergy status to other drugs, medicaments and biological substances status: Secondary | ICD-10-CM | POA: Diagnosis not present

## 2020-08-14 DIAGNOSIS — G309 Alzheimer's disease, unspecified: Secondary | ICD-10-CM | POA: Diagnosis not present

## 2020-08-14 DIAGNOSIS — F0150 Vascular dementia without behavioral disturbance: Secondary | ICD-10-CM | POA: Diagnosis not present

## 2020-08-14 DIAGNOSIS — D649 Anemia, unspecified: Secondary | ICD-10-CM | POA: Diagnosis not present

## 2020-08-14 DIAGNOSIS — K922 Gastrointestinal hemorrhage, unspecified: Secondary | ICD-10-CM | POA: Diagnosis not present

## 2020-08-14 DIAGNOSIS — G9349 Other encephalopathy: Secondary | ICD-10-CM | POA: Diagnosis present

## 2020-08-14 DIAGNOSIS — I10 Essential (primary) hypertension: Secondary | ICD-10-CM | POA: Diagnosis not present

## 2020-08-14 DIAGNOSIS — R4182 Altered mental status, unspecified: Secondary | ICD-10-CM | POA: Diagnosis not present

## 2020-08-14 DIAGNOSIS — I69392 Facial weakness following cerebral infarction: Secondary | ICD-10-CM | POA: Diagnosis not present

## 2020-08-14 DIAGNOSIS — I69398 Other sequelae of cerebral infarction: Secondary | ICD-10-CM | POA: Diagnosis not present

## 2020-08-14 DIAGNOSIS — N1832 Chronic kidney disease, stage 3b: Secondary | ICD-10-CM | POA: Diagnosis present

## 2020-08-14 DIAGNOSIS — I482 Chronic atrial fibrillation, unspecified: Secondary | ICD-10-CM | POA: Diagnosis not present

## 2020-08-14 DIAGNOSIS — I5032 Chronic diastolic (congestive) heart failure: Secondary | ICD-10-CM | POA: Diagnosis present

## 2020-08-14 DIAGNOSIS — L89152 Pressure ulcer of sacral region, stage 2: Secondary | ICD-10-CM | POA: Diagnosis not present

## 2020-08-14 DIAGNOSIS — J439 Emphysema, unspecified: Secondary | ICD-10-CM | POA: Diagnosis not present

## 2020-08-14 DIAGNOSIS — J45998 Other asthma: Secondary | ICD-10-CM | POA: Diagnosis not present

## 2020-08-14 DIAGNOSIS — I69320 Aphasia following cerebral infarction: Secondary | ICD-10-CM | POA: Diagnosis not present

## 2020-08-14 DIAGNOSIS — R2990 Unspecified symptoms and signs involving the nervous system: Secondary | ICD-10-CM | POA: Diagnosis not present

## 2020-08-14 DIAGNOSIS — R5381 Other malaise: Secondary | ICD-10-CM | POA: Diagnosis not present

## 2020-08-14 DIAGNOSIS — I130 Hypertensive heart and chronic kidney disease with heart failure and stage 1 through stage 4 chronic kidney disease, or unspecified chronic kidney disease: Secondary | ICD-10-CM | POA: Diagnosis present

## 2020-08-14 LAB — HEMOGLOBIN A1C
Hgb A1c MFr Bld: 4.7 % — ABNORMAL LOW (ref 4.8–5.6)
Mean Plasma Glucose: 88.19 mg/dL

## 2020-08-14 LAB — LIPID PANEL
Cholesterol: 134 mg/dL (ref 0–200)
HDL: 49 mg/dL (ref >40)
LDL Cholesterol: 66 mg/dL (ref 0–99)
Total CHOL/HDL Ratio: 2.7 RATIO
Triglycerides: 96 mg/dL (ref <150)
VLDL: 19 mg/dL (ref 0–40)

## 2020-08-14 LAB — ECHOCARDIOGRAM COMPLETE
AV Mean grad: 6 mmHg
Area-P 1/2: 2.39 cm2
Height: 61 in
S' Lateral: 2.5 cm
Weight: 1836.8 oz

## 2020-08-14 MED ORDER — PERFLUTREN LIPID MICROSPHERE
1.0000 mL | INTRAVENOUS | Status: AC | PRN
  Administered 2020-08-14: 2 mL via INTRAVENOUS
  Filled 2020-08-14: qty 10

## 2020-08-14 MED ORDER — FOLIC ACID 1 MG PO TABS
1.0000 mg | ORAL_TABLET | Freq: Every day | ORAL | Status: DC
  Administered 2020-08-14 – 2020-08-16 (×3): 1 mg via ORAL
  Filled 2020-08-14 (×3): qty 1

## 2020-08-14 MED ORDER — POLYSACCHARIDE IRON COMPLEX 150 MG PO CAPS
150.0000 mg | ORAL_CAPSULE | Freq: Every day | ORAL | Status: DC
  Administered 2020-08-14 – 2020-08-16 (×3): 150 mg via ORAL
  Filled 2020-08-14 (×3): qty 1

## 2020-08-14 NOTE — Plan of Care (Signed)
  Problem: Acute Rehab PT Goals(only PT should resolve) Goal: Pt Will Go Supine/Side To Sit Flowsheets (Taken 08/14/2020 1051) Pt will go Supine/Side to Sit:  with cues (comment type and amount)  with moderate assist Goal: Pt Will Go Sit To Supine/Side Flowsheets (Taken 08/14/2020 1051) Pt will go Sit to Supine/Side: with moderate assist Goal: Pt Will Transfer Bed To Chair/Chair To Bed Flowsheets (Taken 08/14/2020 1051) Pt will Transfer Bed to Chair/Chair to Bed: with mod assist Goal: Pt Will Ambulate Flowsheets (Taken 08/14/2020 1051) Pt will Ambulate:  15 feet  with moderate assist  with rolling walker

## 2020-08-14 NOTE — Progress Notes (Signed)
*  PRELIMINARY RESULTS* Echocardiogram 2D Echocardiogram has been performed with Definity.  Samuel Germany 08/14/2020, 10:49 AM

## 2020-08-14 NOTE — Progress Notes (Signed)
Patient refused nebs after rx scanned and open.

## 2020-08-14 NOTE — Progress Notes (Signed)
PROGRESS NOTE    Crystal Cooper  SWF:[id number] DOB: 1926-09-12 DOA: 08/13/2020 PCP: [name], MD   Chief Complaint  Patient presents with  . Weakness    Brief admission narrative:  Crystal Cooper is a 87 y.o. female with medical history significant of CKD stage 3b, PAF, dementia, GI bleed, pacemaker implantation, HTN, prior hx of stroke, GERD and chronic diastolic HF; who presented to ED secondary to right side leaning, slow to respond and inability to walk.  Per caregiver patient symptoms worsen and may help to become disoriented.  She was last seen normal around 7:45 in the morning.  By the time the patient presented to the emergency department, her history was confirmed she was outside the therapeutic window for TPA. Patient is still not back to baseline; demonstrating aphasia, right-sided weakness and poor coordination.  Assessment & Plan: 1-Acute left ischemic MCA -Risk factors including hypertension, paroxysmal atrial fibrillation and age -Physical therapy has recommended skilled nursing facility versus home health services with 24-hour supervision. -OT and speech therapy evaluation pending at this time -Complete a stroke work-up with 2D echo, carotid Dopplers and discussion with neurology regarding the need for MRI versus repeat CT head. -Patient status post pacemaker implantation and unable to have MRI done at any pain hospital. -Due to life-threatening GI bleed in the past hesitant to take aspirin or heparin products.  2-paroxysmal atrial fibrillation -Currently sinus on rate control -Continue the use of beta-blockers -Not on anticoagulation secondary to GI bleed in the past.  3-essential hypertension -Stable currently -Continue monitoring allow permissive hypertension in the setting of acute ischemic process.  4-gastroesophageal flux disease/GI bleed -Continue PPI twice a day and Pepcid nightly.  5-chronic kidney disease a stage IIIb -Stable and at  baseline -Continue monitoring renal function trend.  6-stage I pressure injury in her sacrum -Present on admission -No signs of superimposed infection.  7-dementia without behavioral disturbances -Continue constant reorientation -Continue the use of Namenda.   DVT prophylaxis: SCDs Code Status: DNR/DNI Family Communication: Daughter at bedside. Disposition:   Status is: Inpatient  Dispo: The patient is from: Home              Anticipated d/c is to: To be determined              Anticipated d/c date is: To be determined              Patient currently no medically stable for discharge; still demonstrating son ongoing neurologic deficits and needing completion of her stroke work-up.   Difficult to place patient No    Consultants:   Neurology service   Procedures:  See below for x-ray reports 2D echo: Pending Carotid Dopplers: Pending   Antimicrobials:  None   Subjective: Poor coordination, aphasia and right-sided weakness appreciated.  Daughter at bedside expressing patient is not back to baseline.  Objective: Vitals:   08/13/20 1853 08/13/20 2100 08/14/20 0100 08/14/20 0500  BP: (!) 179/64 (!) 130/55 135/66 (!) 143/66  Pulse: (!) 58 (!) 59 (!) 59 60  Resp: 16 16 16 16   Temp:  97.7 F (36.5 C) 98 F (36.7 C) 98.5 F (36.9 C)  TempSrc:  Oral Oral Oral  SpO2: 98% 100% 98% 98%  Weight:      Height:        Intake/Output Summary (Last 24 hours) at 08/14/2020 1620 Last data filed at 08/14/2020 1100 Gross per 24 hour  Intake 120 ml  Output 750 ml  Net -630 ml  Filed Weights   08/13/20 1157  Weight: 52.1 kg    Examination:  General exam: Appears calm and comfortable; still demonstrating aphasia and right-sided weakness.  Per discussion with daughter at bedside, she is not at her baseline. Respiratory system: Clear to auscultation. Respiratory effort normal.  No using accessory muscle.  Good saturation on room air. Cardiovascular system: Rate controlled,  no rubs, no gallops, no JVD appreciated on exam. Gastrointestinal system: Abdomen is nondistended, soft and nontender. No organomegaly or masses felt. Normal bowel sounds heard. Central nervous system: Alert, oriented x1, following simple commands.  Cranial nerves grossly intact.  Right-sided weakness with muscle strength of 3 out of 5 appreciated.  Poor coordination and positive aphasia. Extremities: No cyanosis or clubbing. Skin: No petechiae.  Stage I pressure injury appreciated in her sacral area; no superimposed infection.  Present on admission. Psychiatry: Mood & affect appropriate.     Data Reviewed: I have personally reviewed following labs and imaging studies  CBC: Recent Labs  Lab 08/13/20 1215 08/13/20 1222  WBC 7.5  --   NEUTROABS 5.9  --   HGB 9.4* 10.5*  HCT 31.8* 31.0*  MCV 95.8  --   PLT 270  --     Basic Metabolic Panel: Recent Labs  Lab 08/13/20 1215 08/13/20 1222  NA 135 136  K 3.9 3.8  CL 98 97*  CO2 29  --   GLUCOSE 122* 116*  BUN 24* 25*  CREATININE 1.28* 1.30*  CALCIUM 8.7*  --     GFR: Estimated Creatinine Clearance: 21.3 mL/min (A) (by C-G formula based on SCr of 1.3 mg/dL (H)).  Liver Function Tests: Recent Labs  Lab 08/13/20 1215  AST 26  ALT 15  ALKPHOS 92  BILITOT 0.7  PROT 6.3*  ALBUMIN 2.9*    CBG: Recent Labs  Lab 08/13/20 1201  GLUCAP 114*     Recent Results (from the past 240 hour(s))  Resp Panel by RT-PCR (Flu A&B, Covid) Nasopharyngeal Swab     Status: None   Collection Time: 08/13/20  2:25 PM   Specimen: Nasopharyngeal Swab; Nasopharyngeal(NP) swabs in vial transport medium  Result Value Ref Range Status   SARS Coronavirus 2 by RT PCR NEGATIVE NEGATIVE Final    Comment: (NOTE) SARS-CoV-2 target nucleic acids are NOT DETECTED.  The SARS-CoV-2 RNA is generally detectable in upper respiratory specimens during the acute phase of infection. The lowest concentration of SARS-CoV-2 viral copies this assay can detect  is 138 copies/mL. A negative result does not preclude SARS-Cov-2 infection and should not be used as the sole basis for treatment or other patient management decisions. A negative result may occur with  improper specimen collection/handling, submission of specimen other than nasopharyngeal swab, presence of viral mutation(s) within the areas targeted by this assay, and inadequate number of viral copies(<138 copies/mL). A negative result must be combined with clinical observations, patient history, and epidemiological information. The expected result is Negative.  Fact Sheet for Patients:  EntrepreneurPulse.com.au  Fact Sheet for Healthcare Providers:  IncredibleEmployment.be  This test is no t yet approved or cleared by the [place] FDA and  has been authorized for detection and/or diagnosis of SARS-CoV-2 by FDA under an Emergency Use Authorization (EUA). This EUA will remain  in effect (meaning this test can be used) for the duration of the COVID-19 declaration under Section 564(b)(1) of the Act, 21 U.S.C.section 360bbb-3(b)(1), unless the authorization is terminated  or revoked sooner.       Influenza A  by PCR NEGATIVE NEGATIVE Final   Influenza B by PCR NEGATIVE NEGATIVE Final    Comment: (NOTE) The Xpert Xpress SARS-CoV-2/FLU/RSV plus assay is intended as an aid in the diagnosis of influenza from Nasopharyngeal swab specimens and should not be used as a sole basis for treatment. Nasal washings and aspirates are unacceptable for Xpert Xpress SARS-CoV-2/FLU/RSV testing.  Fact Sheet for Patients: EntrepreneurPulse.com.au  Fact Sheet for Healthcare Providers: IncredibleEmployment.be  This test is not yet approved or cleared by the [place] FDA and has been authorized for detection and/or diagnosis of SARS-CoV-2 by FDA under an Emergency Use Authorization (EUA). This EUA will remain in effect  (meaning this test can be used) for the duration of the COVID-19 declaration under Section 564(b)(1) of the Act, 21 U.S.C. section 360bbb-3(b)(1), unless the authorization is terminated or revoked.  Performed at [facility], [address] [place], [place] [address]      Radiology Studies: CT Head Wo Contrast  Result Date: 08/13/2020 CLINICAL DATA:  Altered mental status since 9 p.m. last night. EXAM: CT HEAD WITHOUT CONTRAST TECHNIQUE: Contiguous axial images were obtained from the base of the skull through the vertex without intravenous contrast. COMPARISON:  Head CT 03/09/2020. FINDINGS: Brain: No evidence of acute infarction, hemorrhage, hydrocephalus, extra-axial collection or mass lesion/mass effect. Atrophy and chronic microvascular ischemic disease are unchanged in appearance. Vascular: No hyperdense vessel or unexpected calcification. Skull: Intact.  No focal lesion. Sinuses/Orbits: Status post cataract surgery.  Otherwise negative. Other: None. IMPRESSION: No acute abnormality. Atrophy and chronic microvascular ischemic disease. Electronically Signed   By: Inge Rise M.D.   On: 08/13/2020 12:16   US Carotid Bilateral (at [facility] and [facility] only)  Result Date: 08/14/2020 CLINICAL DATA:  87 year old female with a history of stroke EXAM: BILATERAL CAROTID DUPLEX ULTRASOUND TECHNIQUE: Pearline Cables scale imaging, color Doppler and duplex ultrasound were performed of bilateral carotid and vertebral arteries in the neck. COMPARISON:  None. FINDINGS: Criteria: Quantification of carotid stenosis is based on velocity parameters that correlate the residual internal carotid diameter with NASCET-based stenosis levels, using the diameter of the distal internal carotid lumen as the denominator for stenosis measurement. The following velocity measurements were obtained: RIGHT ICA:  Systolic 90 cm/sec, Diastolic 19 cm/sec CCA:  740 cm/sec SYSTOLIC ICA/CCA RATIO:  0.8 ECA:  97 cm/sec LEFT ICA:  Systolic 60 cm/sec,  Diastolic 14 cm/sec CCA:  68 cm/sec SYSTOLIC ICA/CCA RATIO:  0.9 ECA:  87 cm/sec Right Brachial SBP: Not acquired Left Brachial SBP: Not acquired RIGHT CAROTID ARTERY: No significant calcifications of the right common carotid artery. Intermediate waveform maintained. Heterogeneous and partially calcified plaque at the right carotid bifurcation. No significant lumen shadowing. Low resistance waveform of the right ICA. No significant tortuosity. RIGHT VERTEBRAL ARTERY: Antegrade flow with low resistance waveform. LEFT CAROTID ARTERY: No significant calcifications of the left common carotid artery. Intermediate waveform maintained. Heterogeneous and partially calcified plaque at the left carotid bifurcation without significant lumen shadowing. Low resistance waveform of the left ICA. No significant tortuosity. LEFT VERTEBRAL ARTERY:  Antegrade flow with low resistance waveform. IMPRESSION: Color duplex indicates minimal heterogeneous and calcified plaque, with no hemodynamically significant stenosis by duplex criteria in the extracranial cerebrovascular circulation. Signed, Dulcy Fanny. Dellia Nims, RPVI [facility] Electronically Signed   By: Corrie Mckusick D.O.   On: 08/14/2020 13:44   CT CEREBRAL PERFUSION W CONTRAST  Result Date: 08/13/2020 CLINICAL DATA:  Speech disturbance. EXAM: CT ANGIOGRAPHY HEAD AND NECK CT  PERFUSION BRAIN TECHNIQUE: Multidetector CT imaging of the head and neck was performed using the standard protocol during bolus administration of intravenous contrast. Multiplanar CT image reconstructions and MIPs were obtained to evaluate the vascular anatomy. Carotid stenosis measurements (when applicable) are obtained utilizing NASCET criteria, using the distal internal carotid diameter as the denominator. Multiphase CT imaging of the brain was performed following IV bolus contrast injection. Subsequent parametric perfusion maps were calculated  using RAPID software. CONTRAST:  132mL OMNIPAQUE IOHEXOL 350 MG/ML SOLN COMPARISON:  None. FINDINGS: CTA NECK FINDINGS Aortic arch: Standard 3 vessel aortic arch with mild atherosclerotic plaque and widely patent arch vessel origins. Right carotid system: Patent with mild-to-moderate calcified and soft plaque at the carotid bifurcation not resulting in significant stenosis. Left carotid system: Patent with mild-to-moderate calcified plaque at the carotid bifurcation not resulting in significant stenosis. Vertebral arteries: Patent without evidence of stenosis or dissection. Mildly to moderately dominant left vertebral artery. Skeleton: Advanced cervical disc degeneration with evidence of severe spinal and neural foraminal stenosis at C5-6. Other neck: No evidence of cervical lymphadenopathy or mass. Upper chest: Motion artifact through the lung apices with scattered small ground-glass and nodular densities in the included portions of the upper lobes likely related to the suspected bilateral upper lobe pneumonia on 08/04/2020 chest radiographs. Review of the MIP images confirms the above findings CTA HEAD FINDINGS Anterior circulation: The internal carotid arteries are patent from skull base to carotid termini with mild atherosclerotic plaque bilaterally not resulting in significant stenosis. There is occlusion of the left M1 segment with good distal collateralization. The right MCA and both ACAs are patent without evidence of a significant proximal stenosis. No aneurysm is identified. Posterior circulation: Intracranial vertebral arteries are widely patent to the basilar. Patent PICA, AICA, and SCA origins are seen bilaterally with the right PICA and right AICA sharing a common trunk. The basilar artery is widely patent. There are right larger than left posterior communicating arteries with a fetal origin of the right PCA. Both PCAs are patent without evidence of a significant proximal stenosis. No aneurysm is  identified. Venous sinuses: Patent. Anatomic variants: Fetal right PCA. Review of the MIP images confirms the above findings CT Brain Perfusion Findings: ASPECTS: 10 CBF (<30%) Volume: 0 mL Perfusion (Tmax>6.0s) volume: 26 mL Mismatch Volume: 26 mL Infarction Location: No core infarct identified by CTP. Ischemic penumbra in the left MCA territory primarily in the left temporoparietal junction region. IMPRESSION: 1. 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 2. 26 mL ischemic penumbra in the left MCA territory without evidence of a core infarct. 3. Mild-to-moderate cervical carotid atherosclerosis without significant stenosis. 4. Aortic Atherosclerosis (ICD10-I70.0). These results were called by telephone at the time of interpretation on 08/13/2020 at 1:12 p.m. to Dr. Donnetta Simpers, who verbally acknowledged these results. Electronically Signed   By: Logan Bores M.D.   On: 08/13/2020 13:31   ECHOCARDIOGRAM COMPLETE  Result Date: 08/14/2020    ECHOCARDIOGRAM REPORT   Patient Name:   Crystal Cooper [facility] Date of Exam: 08/14/2020 Medical Rec #:  [id number]       Height:       61.0 in Accession #:    [id number]      Weight:       114.8 lb Date of Birth:  [birth year]        BSA:          1.492 m Patient Age:    24 years        BP:  143/66 mmHg Patient Gender: F               HR:           60 bpm. Exam Location:  [facility] Procedure: 2D Echo, Cardiac Doppler and Color Doppler Indications:    Stroke 434.91 / I163.9  History:        Patient has prior history of Echocardiogram examinations.                 Pacemaker, COPD, Arrythmias:Atrial Fibrillation; Risk                 Factors:Hypertension. Long term (current) use of anticoagulants,                 S/P TAVR (transcatheter aortic valve replacement).  Sonographer:    Alvino Chapel RCS Referring Phys: [place]  1. Left ventricular ejection fraction, by estimation, is 60 to 65%. The left ventricle has normal function. The left  ventricle has no regional wall motion abnormalities. There is mild left ventricular hypertrophy. Left ventricular diastolic parameters are indeterminate.  2. Right ventricular systolic function is normal. The right ventricular size is normal. There is normal pulmonary artery systolic pressure. The estimated right ventricular systolic pressure is 35.5 mmHg.  3. Left atrial size was moderately dilated.  4. Right atrial size was severely dilated.  5. The mitral valve is abnormal. No evidence of mitral valve regurgitation. Moderate mitral annular calcification.  6. There is a 29 mm Sapien prosthetic, stented (TAVR) valve present in the aortic position. Aortic valve regurgitation is not visualized. Echo findings are consistent with normal structure and function of the aortic valve prosthesis. Aortic valve mean gradient measures 6.0 mmHg.  7. The inferior vena cava is normal in size with greater than 50% respiratory variability, suggesting right atrial pressure of 3 mmHg. FINDINGS  Left Ventricle: Left ventricular ejection fraction, by estimation, is 60 to 65%. The left ventricle has normal function. The left ventricle has no regional wall motion abnormalities. The left ventricular internal cavity size was normal in size. There is  mild left ventricular hypertrophy. Left ventricular diastolic parameters are indeterminate. Right Ventricle: The right ventricular size is normal. No increase in right ventricular wall thickness. Right ventricular systolic function is normal. There is normal pulmonary artery systolic pressure. The tricuspid regurgitant velocity is 2.70 m/s, and  with an assumed right atrial pressure of 3 mmHg, the estimated right ventricular systolic pressure is 73.2 mmHg. Left Atrium: Left atrial size was moderately dilated. Right Atrium: Right atrial size was severely dilated. Pericardium: There is no evidence of pericardial effusion. Mitral Valve: The mitral valve is abnormal. Moderate mitral annular  calcification. No evidence of mitral valve regurgitation. Tricuspid Valve: The tricuspid valve is normal in structure. Tricuspid valve regurgitation is trivial. Aortic Valve: The aortic valve has been repaired/replaced. Aortic valve regurgitation is not visualized. Aortic valve mean gradient measures 6.0 mmHg. There is a 29 mm Sapien prosthetic, stented (TAVR) valve present in the aortic position. Echo findings are consistent with normal structure and function of the aortic valve prosthesis. Pulmonic Valve: The pulmonic valve was not well visualized. Pulmonic valve regurgitation is not visualized. Aorta: The aortic root is normal in size and structure. Venous: The inferior vena cava is normal in size with greater than 50% respiratory variability, suggesting right atrial pressure of 3 mmHg. IAS/Shunts: The interatrial septum was not well visualized.  LEFT VENTRICLE PLAX 2D LVIDd:  3.70 cm  Diastology LVIDs:         2.50 cm  LV e' medial:    6.53 cm/s LV PW:         1.10 cm  LV E/e' medial:  18.4 LV IVS:        1.20 cm  LV e' lateral:   6.74 cm/s LVOT diam:     1.50 cm  LV E/e' lateral: 17.8 LV SV:         29 LV SV Index:   20 LVOT Area:     1.77 cm  RIGHT VENTRICLE RV S prime:     10.40 cm/s TAPSE (M-mode): 1.5 cm LEFT ATRIUM             Index       RIGHT ATRIUM           Index LA diam:        4.00 cm 2.68 cm/m  RA Area:     27.20 cm LA Vol (A2C):   72.5 ml 48.60 ml/m RA Volume:   96.00 ml  64.35 ml/m LA Vol (A4C):   53.3 ml 35.73 ml/m LA Biplane Vol: 68.1 ml 45.65 ml/m  AORTIC VALVE AV Mean Grad: 6.0 mmHg LVOT Vmax:    79.20 cm/s LVOT Vmean:   48.200 cm/s LVOT VTI:     0.165 m  AORTA Ao Root diam: 2.70 cm MITRAL VALVE                TRICUSPID VALVE MV Area (PHT): 2.39 cm     TR Peak grad:   29.2 mmHg MV Decel Time: 318 msec     TR Vmax:        270.00 cm/s MV E velocity: 120.00 cm/s MV A velocity: 29.50 cm/s   SHUNTS MV E/A ratio:  4.07         Systemic VTI:  0.16 m                             Systemic  Diam: 1.50 cm Oswaldo Milian MD Electronically signed by Oswaldo Milian MD Signature Date/Time: 08/14/2020/1:19:31 PM    Final    CT ANGIO HEAD CODE STROKE  Result Date: 08/13/2020 CLINICAL DATA:  Speech disturbance. EXAM: CT ANGIOGRAPHY HEAD AND NECK CT PERFUSION BRAIN TECHNIQUE: Multidetector CT imaging of the head and neck was performed using the standard protocol during bolus administration of intravenous contrast. Multiplanar CT image reconstructions and MIPs were obtained to evaluate the vascular anatomy. Carotid stenosis measurements (when applicable) are obtained utilizing NASCET criteria, using the distal internal carotid diameter as the denominator. Multiphase CT imaging of the brain was performed following IV bolus contrast injection. Subsequent parametric perfusion maps were calculated using RAPID software. CONTRAST:  191mL OMNIPAQUE IOHEXOL 350 MG/ML SOLN COMPARISON:  None. FINDINGS: CTA NECK FINDINGS Aortic arch: Standard 3 vessel aortic arch with mild atherosclerotic plaque and widely patent arch vessel origins. Right carotid system: Patent with mild-to-moderate calcified and soft plaque at the carotid bifurcation not resulting in significant stenosis. Left carotid system: Patent with mild-to-moderate calcified plaque at the carotid bifurcation not resulting in significant stenosis. Vertebral arteries: Patent without evidence of stenosis or dissection. Mildly to moderately dominant left vertebral artery. Skeleton: Advanced cervical disc degeneration with evidence of severe spinal and neural foraminal stenosis at C5-6. Other neck: No evidence of cervical lymphadenopathy or mass. Upper chest: Motion artifact through the lung apices with scattered small ground-glass and  nodular densities in the included portions of the upper lobes likely related to the suspected bilateral upper lobe pneumonia on 08/04/2020 chest radiographs. Review of the MIP images confirms the above findings CTA HEAD  FINDINGS Anterior circulation: The internal carotid arteries are patent from skull base to carotid termini with mild atherosclerotic plaque bilaterally not resulting in significant stenosis. There is occlusion of the left M1 segment with good distal collateralization. The right MCA and both ACAs are patent without evidence of a significant proximal stenosis. No aneurysm is identified. Posterior circulation: Intracranial vertebral arteries are widely patent to the basilar. Patent PICA, AICA, and SCA origins are seen bilaterally with the right PICA and right AICA sharing a common trunk. The basilar artery is widely patent. There are right larger than left posterior communicating arteries with a fetal origin of the right PCA. Both PCAs are patent without evidence of a significant proximal stenosis. No aneurysm is identified. Venous sinuses: Patent. Anatomic variants: Fetal right PCA. Review of the MIP images confirms the above findings CT Brain Perfusion Findings: ASPECTS: 10 CBF (<30%) Volume: 0 mL Perfusion (Tmax>6.0s) volume: 26 mL Mismatch Volume: 26 mL Infarction Location: No core infarct identified by CTP. Ischemic penumbra in the left MCA territory primarily in the left temporoparietal junction region. IMPRESSION: 1. 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 2. 26 mL ischemic penumbra in the left MCA territory without evidence of a core infarct. 3. Mild-to-moderate cervical carotid atherosclerosis without significant stenosis. 4. Aortic Atherosclerosis (ICD10-I70.0). These results were called by telephone at the time of interpretation on 08/13/2020 at 1:12 p.m. to Dr. Donnetta Simpers, who verbally acknowledged these results. Electronically Signed   By: Logan Bores M.D.   On: 08/13/2020 13:31   CT ANGIO NECK CODE STROKE  Result Date: 08/13/2020 CLINICAL DATA:  Speech disturbance. EXAM: CT ANGIOGRAPHY HEAD AND NECK CT PERFUSION BRAIN TECHNIQUE: Multidetector CT imaging of the head and neck was  performed using the standard protocol during bolus administration of intravenous contrast. Multiplanar CT image reconstructions and MIPs were obtained to evaluate the vascular anatomy. Carotid stenosis measurements (when applicable) are obtained utilizing NASCET criteria, using the distal internal carotid diameter as the denominator. Multiphase CT imaging of the brain was performed following IV bolus contrast injection. Subsequent parametric perfusion maps were calculated using RAPID software. CONTRAST:  119mL OMNIPAQUE IOHEXOL 350 MG/ML SOLN COMPARISON:  None. FINDINGS: CTA NECK FINDINGS Aortic arch: Standard 3 vessel aortic arch with mild atherosclerotic plaque and widely patent arch vessel origins. Right carotid system: Patent with mild-to-moderate calcified and soft plaque at the carotid bifurcation not resulting in significant stenosis. Left carotid system: Patent with mild-to-moderate calcified plaque at the carotid bifurcation not resulting in significant stenosis. Vertebral arteries: Patent without evidence of stenosis or dissection. Mildly to moderately dominant left vertebral artery. Skeleton: Advanced cervical disc degeneration with evidence of severe spinal and neural foraminal stenosis at C5-6. Other neck: No evidence of cervical lymphadenopathy or mass. Upper chest: Motion artifact through the lung apices with scattered small ground-glass and nodular densities in the included portions of the upper lobes likely related to the suspected bilateral upper lobe pneumonia on 08/04/2020 chest radiographs. Review of the MIP images confirms the above findings CTA HEAD FINDINGS Anterior circulation: The internal carotid arteries are patent from skull base to carotid termini with mild atherosclerotic plaque bilaterally not resulting in significant stenosis. There is occlusion of the left M1 segment with good distal collateralization. The right MCA and both ACAs are patent without evidence of  a significant proximal  stenosis. No aneurysm is identified. Posterior circulation: Intracranial vertebral arteries are widely patent to the basilar. Patent PICA, AICA, and SCA origins are seen bilaterally with the right PICA and right AICA sharing a common trunk. The basilar artery is widely patent. There are right larger than left posterior communicating arteries with a fetal origin of the right PCA. Both PCAs are patent without evidence of a significant proximal stenosis. No aneurysm is identified. Venous sinuses: Patent. Anatomic variants: Fetal right PCA. Review of the MIP images confirms the above findings CT Brain Perfusion Findings: ASPECTS: 10 CBF (<30%) Volume: 0 mL Perfusion (Tmax>6.0s) volume: 26 mL Mismatch Volume: 26 mL Infarction Location: No core infarct identified by CTP. Ischemic penumbra in the left MCA territory primarily in the left temporoparietal junction region. IMPRESSION: 1. 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 2. 26 mL ischemic penumbra in the left MCA territory without evidence of a core infarct. 3. Mild-to-moderate cervical carotid atherosclerosis without significant stenosis. 4. Aortic Atherosclerosis (ICD10-I70.0). These results were called by telephone at the time of interpretation on 08/13/2020 at 1:12 p.m. to Dr. Donnetta Simpers, who verbally acknowledged these results. Electronically Signed   By: Logan Bores M.D.   On: 08/13/2020 13:31    Scheduled Meds: .  stroke: mapping our early stages of recovery book   Does not apply Once  . arformoterol  15 mcg Nebulization BID  . budesonide  0.25 mg Nebulization BID  . docusate sodium  100 mg Oral BID  . famotidine  20 mg Oral QHS  . folic acid  1 mg Oral Daily  . iron polysaccharides  150 mg Oral Daily  . levothyroxine  25 mcg Oral Daily  . memantine  10 mg Oral BID  . metoprolol tartrate  25 mg Oral BID  . pantoprazole  40 mg Oral BID  . umeclidinium bromide  1 puff Inhalation Daily   Continuous Infusions:   LOS: 0 days     Time spent: 35 minutes    Barton Dubois, MD [facility]   To contact the attending provider between 7A-7P or the covering provider during after hours 7P-7A, please log into the web site [email or web address] and access using universal [place] password for that web site. If you do not have the password, please call the hospital operator.  08/14/2020, 4:20 PM

## 2020-08-14 NOTE — Evaluation (Signed)
Physical Therapy Evaluation Patient Details Name: Crystal Cooper MRN: [id number] DOB: 16-Apr-1927 Today's Date: 08/14/2020   History of Present Illness  HPI: Crystal Cooper is a 87 y.o. female with medical history significant of CKD stage 3b, PAF, dementia, GI bleed, pacemaker implantation, HTN, prior hx of stroke, GERD and chronic diastolic HF; who presented to ED secondary to right side leaning, slow to respond and inability to walk.  Per caregiver patient symptoms worsen and may help to become disoriented.  She was last seen normal around 7:45 in the morning.  By the time the patient presented to the emergency department, her history was confirmed she was outside the therapeutic window for TPA.  Patient denies chest pain, fever, chills, nausea, vomiting, dysuria, hematuria, melena, hematochezia, abdominal pain  Clinical Impression  Pt appears to be cooperating with therapist but does not move legs upon command, noted small motions after therapist attempted to get pt to move her ankles and when sitting straighten her knees.  Pt is non verbal at this time. Normally able to ambulate with assistance for short distances, ie bedroom to den but unable to stand without max assist at this time.     Follow Up Recommendations SNF;Home health PT;Supervision/Assistance - 24 hour    Equipment Recommendations  None recommended by PT    Recommendations for Other Services   speech   Precautions / Restrictions Precautions Precautions: Fall      Mobility  Bed Mobility Overal bed mobility: Needs Assistance Bed Mobility: Supine to Sit;Sit to Supine     Supine to sit: Max assist Sit to supine: Max assist        Transfers Overall transfer level: Needs assistance   Transfers: Sit to/from Stand Sit to Stand: Max assist            Ambulation/Gait             General Gait Details: unable to ambulate or side step  Stairs            Wheelchair Mobility    Modified Rankin  (Stroke Patients Only)       Balance Overall balance assessment: Needs assistance Sitting-balance support: Bilateral upper extremity supported Sitting balance-Leahy Scale: Fair     Standing balance support: Bilateral upper extremity supported Standing balance-Leahy Scale: Poor                               Pertinent Vitals/Pain Pain Assessment: Faces Pain Score: 6  Pain Location: IV site    Home Living Family/patient expects to be discharged to:: Skilled nursing facility Living Arrangements: Alone Available Help at Discharge: Family;Personal care attendant;Available 24 hours/day Type of Home: House Home Access: Stairs to enter Entrance Stairs-Rails: Left Entrance Stairs-Number of Steps: 3 Home Layout: One level Home Equipment: Cane - single point;Walker - 2 wheels;Grab bars - toilet;Grab bars - tub/shower      Prior Function Level of Independence: Needs assistance   Gait / Transfers Assistance Needed: pt ambulates household distances with RW, gt belt and always someone with her ; family and caregivers assist  ADL's / Homemaking Assistance Needed: family completes        Hand Dominance        Extremity/Trunk Assessment        Lower Extremity Assessment Lower Extremity Assessment: Difficult to assess due to impaired cognition;Generalized weakness (Pt does not assist in exercises, when standing major lean to RT can not straighten up  on command.)       Communication   Communication: HOH  Cognition Arousal/Alertness: Awake/alert Behavior During Therapy: WFL for tasks assessed/performed Overall Cognitive Status: Difficult to assess                                               Assessment/Plan    PT Assessment Patient needs continued PT services  PT Problem List Decreased strength;Decreased mobility;Decreased activity tolerance;Decreased balance       PT Treatment Interventions Gait training;Therapeutic  activities;Therapeutic exercise;Balance training;Patient/family education    PT Goals (Current goals can be found in the Care Plan section)       Frequency Min 4X/week   Barriers to discharge           AM-PAC PT "6 Clicks" Mobility  Outcome Measure Help needed turning from your back to your side while in a flat bed without using bedrails?: Total Help needed moving from lying on your back to sitting on the side of a flat bed without using bedrails?: Total Help needed moving to and from a bed to a chair (including a wheelchair)?: Total Help needed standing up from a chair using your arms (e.g., wheelchair or bedside chair)?: Total Help needed to walk in hospital room?: Total Help needed climbing 3-5 steps with a railing? : Total 6 Click Score: 6    End of Session Equipment Utilized During Treatment: Gait belt Activity Tolerance: Patient tolerated treatment well Patient left: in bed;Other (comment) (Pt having echo completed) Nurse Communication: Mobility status PT Visit Diagnosis: Unsteadiness on feet (R26.81);Other abnormalities of gait and mobility (R26.89);Muscle weakness (generalized) (M62.81)    Time: 1000-1045 PT Time Calculation (min) (ACUTE ONLY): 45 min   Charges:   PT Evaluation $PT Eval Low Complexity: 1 Low PT Treatments $Therapeutic Activity: 8-22 mins          Rayetta Humphrey, PT CLT [phone] 08/14/2020, 10:53 AM

## 2020-08-15 DIAGNOSIS — R4701 Aphasia: Secondary | ICD-10-CM | POA: Diagnosis not present

## 2020-08-15 DIAGNOSIS — R4182 Altered mental status, unspecified: Secondary | ICD-10-CM | POA: Diagnosis not present

## 2020-08-15 LAB — GLUCOSE, CAPILLARY: Glucose-Capillary: 102 mg/dL — ABNORMAL HIGH (ref 70–99)

## 2020-08-15 MED ORDER — FUROSEMIDE 40 MG PO TABS
40.0000 mg | ORAL_TABLET | Freq: Every day | ORAL | Status: DC
  Administered 2020-08-15 – 2020-08-16 (×2): 40 mg via ORAL
  Filled 2020-08-15 (×2): qty 1

## 2020-08-15 MED ORDER — PHENOL 1.4 % MT LIQD
1.0000 | OROMUCOSAL | Status: DC | PRN
  Filled 2020-08-15: qty 177

## 2020-08-15 NOTE — Progress Notes (Signed)
Pt remains up in chair, has slept most of afternoon. Answers most questions appropriately, but is HOH. Still only oriented to person. Still complains of sore throat even after oral chloraseptic administered.  Oral pharynx examined with light, no reddness or swelling noted. Pt given ice cream/ensure milkshake and tolerated well.

## 2020-08-15 NOTE — TOC Progression Note (Signed)
Transition of Care [facility]) - Progression Note    Patient Details  Name: Crystal Cooper MRN: [id number] Date of Birth: 08-12-1926  Transition [facility] CM/SW Contact  Roda Shutters Margretta Sidle, RN Phone Number: 08/15/2020, 12:40 PM  The Appropriate Use Committee has met to discuss this patient's plan of care to review recommendations for post-acute treatment options.  The options were reviewed with the attending MD, TOC, rehab services and the physician advisor.  All available documentation has been reviewed and the determination has been made that this patient does meet criteria for placement in a [facility] for short-term rehab. A consult to the Transitions of Care Team has been made to discuss alternative options and discharge planning with the patient/family.

## 2020-08-15 NOTE — NC FL2 (Signed)
[place] MEDICAID FL2 LEVEL OF CARE SCREENING TOOL     IDENTIFICATION  Patient Name: Crystal Cooper Birthdate: [birth year] Sex: female Admission Date (Current Location): 08/13/2020  [facility] and [place] Number:  [employer] and Address:  [place] [address] [place]      Provider Number: [id number]  Attending Physician Name and Address:  Barton Dubois, MD  Relative Name and Phone Number:  Synetta Fail - daughter  [phone]    Current Level of Care: Hospital Recommended Level of Care: [place] Prior Approval Number:    Date Approved/Denied:   PASRR Number: [id number]  Discharge Plan:      Current Diagnoses: Patient Active Problem List   Diagnosis Date Noted  . Pressure injury of skin 08/14/2020  . Acute ischemic left MCA stroke ([place]) 08/14/2020  . Stroke-like symptoms 08/13/2020  . Rectal bleeding 07/07/2020  . Impairment of balance 06/28/2020  . Status post hip surgery   . Upper GI bleed 02/19/2020  . Acute blood loss anemia 02/19/2020  . CKD (chronic kidney disease) stage 3, GFR 30-59 ml/min (HCC) 02/19/2020  . Hematochezia 02/19/2020  . Iron deficiency anemia due to chronic blood loss 02/18/2020  . AVM (arteriovenous malformation) 02/18/2020  . COPD (chronic obstructive pulmonary disease) ([place]) 02/18/2020  . Dementia without behavioral disturbance ([place]) 02/18/2020  . SOB (shortness of breath)   . COPD with acute exacerbation ([place])   . CAP (community acquired pneumonia) 02/12/2020  . Long term (current) use of anticoagulants   . Wheezing 09/23/2019  . Abnormal breath sounds 09/09/2019  . Hypokalemia 08/23/2019  . Reactive airway disease 03/24/2019  . Gastroesophageal reflux disease 03/24/2019  . GI bleed 05/12/2018  . 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 Diastolic dysfunction 77/41/2878  . Pacemaker 05/12/2018  . Iron deficiency anemia   . Melena   . Duodenal  arteriovenous malformation   . Gastritis and gastroduodenitis   . Normocytic anemia 04/06/2018  . Osteoporosis 08/27/2016  . ASCVD (arteriosclerotic cardiovascular disease) 01/11/2016  . Asthma 01/11/2016  . Glaucoma 01/11/2016  . Hearing loss 01/11/2016  . Other nonrheumatic mitral valve disorders 01/11/2016  . Junctional bradycardia   . PAF (paroxysmal atrial fibrillation) ([place]) 09/06/2015  . Hyponatremia 12/17/2014  . Congestive heart failure ([place]) 12/12/2014  . Murmur 10/19/2011  . Essential hypertension 10/19/2011  . History of cardiac radiofrequency ablation (RFA) 10/19/2011    Orientation RESPIRATION BLADDER Height & Weight     Self,Time,Situation  Normal External catheter Weight: 52.1 kg Height:  5\' 1"  (154.9 cm)  BEHAVIORAL SYMPTOMS/MOOD NEUROLOGICAL BOWEL NUTRITION STATUS      Continent Diet (DC Summary)  AMBULATORY STATUS COMMUNICATION OF NEEDS Skin   Extensive Assist Verbally Normal                       Personal Care Assistance Level of Assistance  Bathing,Feeding,Dressing Bathing Assistance: Maximum assistance Feeding assistance: Limited assistance Dressing Assistance: Maximum assistance     Functional Limitations Info  Sight,Speech,Hearing Sight Info: Adequate Hearing Info: Adequate      SPECIAL CARE FACTORS FREQUENCY  PT (By licensed PT),OT (By licensed OT)     PT Frequency: 5 times a week OT Frequency: 3 times a week            Contractures Contractures Info: Not present    Additional Factors Info  Code Status,Allergies Code Status Info: DNR Allergies Info: HCTZ, aspririn, codeine  Current Medications (08/15/2020):  This is the current hospital active medication list Current Facility-Administered Medications  Medication Dose Route Frequency Provider Last Rate Last Admin  .  stroke: mapping our early stages of recovery book   Does not apply Once Barton Dubois, MD      . acetaminophen (TYLENOL) tablet 650 mg  650 mg Oral  Q4H PRN Barton Dubois, MD       Or  . acetaminophen (TYLENOL) 160 MG/5ML solution 650 mg  650 mg Per Tube Q4H PRN Barton Dubois, MD       Or  . acetaminophen (TYLENOL) suppository 650 mg  650 mg Rectal Q4H PRN Barton Dubois, MD      . albuterol (VENTOLIN HFA) 108 (90 Base) MCG/ACT inhaler 2 puff  2 puff Inhalation Q4H PRN Barton Dubois, MD      . arformoterol (BROVANA) nebulizer solution 15 mcg  15 mcg Nebulization BID Barton Dubois, MD   15 mcg at 08/15/20 8329  . budesonide (PULMICORT) nebulizer solution 0.25 mg  0.25 mg Nebulization BID Barton Dubois, MD   0.25 mg at 08/15/20 1916  . docusate sodium (COLACE) capsule 100 mg  100 mg Oral BID Barton Dubois, MD   100 mg at 08/15/20 1129  . famotidine (PEPCID) tablet 20 mg  20 mg Oral QHS Barton Dubois, MD   20 mg at 08/14/20 2138  . folic acid (FOLVITE) tablet 1 mg  1 mg Oral Daily Barton Dubois, MD   1 mg at 08/15/20 1129  . iron polysaccharides (NIFEREX) capsule 150 mg  150 mg Oral Daily Barton Dubois, MD   150 mg at 08/15/20 1130  . levothyroxine (SYNTHROID) tablet 25 mcg  25 mcg Oral Daily Barton Dubois, MD   25 mcg at 08/15/20 1130  . memantine (NAMENDA) tablet 10 mg  10 mg Oral BID Barton Dubois, MD   10 mg at 08/15/20 1129  . metoprolol tartrate (LOPRESSOR) tablet 25 mg  25 mg Oral BID Barton Dubois, MD   25 mg at 08/15/20 1140  . pantoprazole (PROTONIX) EC tablet 40 mg  40 mg Oral BID Barton Dubois, MD   40 mg at 08/15/20 1130  . phenol (CHLORASEPTIC) mouth spray 1 spray  1 spray Mouth/Throat PRN Barton Dubois, MD      . umeclidinium bromide (INCRUSE ELLIPTA) 62.5 MCG/INH 1 puff  1 puff Inhalation Daily Barton Dubois, MD   1 puff at 08/15/20 6060     Discharge Medications: Please see discharge summary for a list of discharge medications.  Relevant Imaging Results:  Relevant Lab Results:   Additional Information SS# [id number]  Boneta Lucks, RN

## 2020-08-15 NOTE — Plan of Care (Signed)
  Problem: Acute Rehab OT Goals (only OT should resolve) Goal: Pt. Will Perform Grooming Flowsheets (Taken 08/15/2020 1018) Pt Will Perform Grooming:  with modified independence  sitting  with adaptive equipment Goal: Pt. Will Perform Upper Body Bathing Flowsheets (Taken 08/15/2020 1018) Pt Will Perform Upper Body Bathing:  with supervision  sitting Goal: Pt. Will Perform Upper Body Dressing Flowsheets (Taken 08/15/2020 1018) Pt Will Perform Upper Body Dressing:  with supervision  sitting  with adaptive equipment Goal: Pt. Will Perform Lower Body Dressing Flowsheets (Taken 08/15/2020 1018) Pt Will Perform Lower Body Dressing:  with mod assist  sitting/lateral leans  sit to/from stand Goal: Pt. Will Transfer To Toilet Flowsheets (Taken 08/15/2020 1018) Pt Will Transfer to Toilet:  with min guard assist  with supervision  stand pivot transfer Goal: Pt/Caregiver Will Perform Home Exercise Program Flowsheets (Taken 08/15/2020 1018) Pt/caregiver will Perform Home Exercise Program:  Increased strength  Both right and left upper extremity  With Supervision

## 2020-08-15 NOTE — Progress Notes (Signed)
PROGRESS NOTE    Crystal Cooper  OMA:[id number] DOB: 05-Jun-1927 DOA: 08/13/2020 PCP: [name], MD   Chief Complaint  Patient presents with  . Weakness    Brief admission narrative:  Crystal Cooper is a 87 y.o. female with medical history significant of CKD stage 3b, PAF, dementia, GI bleed, pacemaker implantation, HTN, prior hx of stroke, GERD and chronic diastolic HF; who presented to ED secondary to right side leaning, slow to respond and inability to walk.  Per caregiver patient symptoms worsen and may help to become disoriented.  She was last seen normal around 7:45 in the morning.  By the time the patient presented to the emergency department, her history was confirmed she was outside the therapeutic window for TPA. Patient is still not back to baseline; demonstrating aphasia, right-sided weakness and poor coordination.  Assessment & Plan: 1-Acute left ischemic MCA -Risk factors including hypertension, paroxysmal atrial fibrillation and age -Physical therapy and occupational therapy has recommended to skilled nursing facility.  Speech therapy evaluation pending.  But tolerating diet without problems. -No significant normalities on carotid Dopplers and a stable 2D echo. -Patient status post pacemaker implantation and unable to have MRI done at [facility] hospital.  After discussing with neurology service no need to repeat further brain images at this point. -We will follow final recommendation for secondary prevention daily. -Due to life-threatening GI bleed in the past hesitant to take aspirin or heparin products.  2-paroxysmal atrial fibrillation -Currently sinus on rate control -Continue the use of beta-blockers -Not on anticoagulation secondary to GI bleed in the past.  3-essential hypertension -Stable currently -Continue monitoring allow permissive hypertension in the setting of acute ischemic process.  4-gastroesophageal flux disease/GI bleed -Continue PPI twice a day and  Pepcid nightly.  5-chronic kidney disease a stage IIIb -Stable and at baseline -Continue monitoring renal function trend.  6-stage I pressure injury in her sacrum -Present on admission -No signs of superimposed infection.  7-dementia without behavioral disturbances -Continue constant reorientation -Continue the use of Namenda.   DVT prophylaxis: SCDs Code Status: DNR/DNI Family Communication: Daughter at bedside. Disposition:   Status is: Inpatient  Dispo: The patient is from: Home              Anticipated d/c is to: SNF              Anticipated d/c date is: To be determined              Patient currently no medically stable for discharge; still demonstrating son ongoing neurologic deficits and needing completion of her stroke work-up.   Difficult to place patient No    Consultants:   Neurology service   Procedures:  See below for x-ray reports 2D echo: No wall motion abnormalities, preserved ejection fraction.  Stable TAVR. Carotid Dopplers: Demonstrating minimal stenosis without significant hemodynamic vascular compromises.   Antimicrobials:  None   Subjective: In no distress still having some right-sided weakness but demonstrated improvement in her ability to articulate wants, communicate and coordinate activities.  Sitting up having breakfast with minimal assistance.  No fever, no chest pain, no nausea, no vomiting.  Objective: Vitals:   08/15/20 0605 08/15/20 0723 08/15/20 1100 08/15/20 1519  BP: (!) 151/64  127/62 (!) 147/75  Pulse: 62  (!) 55 63  Resp: 18  16 17   Temp: 98 F (36.7 C)     TempSrc: Oral     SpO2: 96% 97% 100% 97%  Weight:  Height:        Intake/Output Summary (Last 24 hours) at 08/15/2020 1634 Last data filed at 08/15/2020 1500 Gross per 24 hour  Intake 600 ml  Output 600 ml  Net 0 ml   Filed Weights   08/13/20 1157  Weight: 52.1 kg    Examination: General exam: Alert, awake, and following commands.  No chest pain, no  nausea, no vomiting.  Sitting up having breakfast this morning.  Daughter at bedside reported improvement in her overall neurologic previous seeing deficits. Respiratory system: Clear to auscultation. Respiratory effort normal.  No wheezing, no crackles.  No using accessory muscles.  No requiring oxygen supplementation. Cardiovascular system: Rate controlled, positive click, no JVD, no rubs or gallops. Gastrointestinal system: Abdomen is nondistended, soft and nontender. No organomegaly or masses felt. Normal bowel sounds heard. Central nervous system: Cranial nerves grossly intact, with right-sided muscle strength 3 out of 5 upper and lower extremities.  Patient has demonstrated improvement in gait ability to articulate words and very responsive. Extremities: No cyanosis or clubbing. Skin: Stage I pressure injury appreciating her sacral area, no superimposed infection.  Present on admission. Psychiatry: Mood & affect appropriate.     Data Reviewed: I have personally reviewed following labs and imaging studies  CBC: Recent Labs  Lab 08/13/20 1215 08/13/20 1222  WBC 7.5  --   NEUTROABS 5.9  --   HGB 9.4* 10.5*  HCT 31.8* 31.0*  MCV 95.8  --   PLT 270  --     Basic Metabolic Panel: Recent Labs  Lab 08/13/20 1215 08/13/20 1222  NA 135 136  K 3.9 3.8  CL 98 97*  CO2 29  --   GLUCOSE 122* 116*  BUN 24* 25*  CREATININE 1.28* 1.30*  CALCIUM 8.7*  --     GFR: Estimated Creatinine Clearance: 21.3 mL/min (A) (by C-G formula based on SCr of 1.3 mg/dL (H)).  Liver Function Tests: Recent Labs  Lab 08/13/20 1215  AST 26  ALT 15  ALKPHOS 92  BILITOT 0.7  PROT 6.3*  ALBUMIN 2.9*    CBG: Recent Labs  Lab 08/13/20 1201 08/15/20 0727  GLUCAP 114* 102*    Recent Results (from the past 240 hour(s))  Resp Panel by RT-PCR (Flu A&B, Covid) Nasopharyngeal Swab     Status: None   Collection Time: 08/13/20  2:25 PM   Specimen: Nasopharyngeal Swab; Nasopharyngeal(NP) swabs in  vial transport medium  Result Value Ref Range Status   SARS Coronavirus 2 by RT PCR NEGATIVE NEGATIVE Final    Comment: (NOTE) SARS-CoV-2 target nucleic acids are NOT DETECTED.  The SARS-CoV-2 RNA is generally detectable in upper respiratory specimens during the acute phase of infection. The lowest concentration of SARS-CoV-2 viral copies this assay can detect is 138 copies/mL. A negative result does not preclude SARS-Cov-2 infection and should not be used as the sole basis for treatment or other patient management decisions. A negative result may occur with  improper specimen collection/handling, submission of specimen other than nasopharyngeal swab, presence of viral mutation(s) within the areas targeted by this assay, and inadequate number of viral copies(<138 copies/mL). A negative result must be combined with clinical observations, patient history, and epidemiological information. The expected result is Negative.  Fact Sheet for Patients:  EntrepreneurPulse.com.au  Fact Sheet for Healthcare Providers:  IncredibleEmployment.be  This test is no t yet approved or cleared by the [place] FDA and  has been authorized for detection and/or diagnosis of SARS-CoV-2 by FDA under  an Emergency Use Authorization (EUA). This EUA will remain  in effect (meaning this test can be used) for the duration of the COVID-19 declaration under Section 564(b)(1) of the Act, 21 U.S.C.section 360bbb-3(b)(1), unless the authorization is terminated  or revoked sooner.       Influenza A by PCR NEGATIVE NEGATIVE Final   Influenza B by PCR NEGATIVE NEGATIVE Final    Comment: (NOTE) The Xpert Xpress SARS-CoV-2/FLU/RSV plus assay is intended as an aid in the diagnosis of influenza from Nasopharyngeal swab specimens and should not be used as a sole basis for treatment. Nasal washings and aspirates are unacceptable for Xpert Xpress SARS-CoV-2/FLU/RSV testing.  Fact  Sheet for Patients: EntrepreneurPulse.com.au  Fact Sheet for Healthcare Providers: IncredibleEmployment.be  This test is not yet approved or cleared by the [place] FDA and has been authorized for detection and/or diagnosis of SARS-CoV-2 by FDA under an Emergency Use Authorization (EUA). This EUA will remain in effect (meaning this test can be used) for the duration of the COVID-19 declaration under Section 564(b)(1) of the Act, 21 U.S.C. section 360bbb-3(b)(1), unless the authorization is terminated or revoked.  Performed at [facility], [address] [place], [place] [address]      Radiology Studies: US Carotid Bilateral (at [facility] and [facility] only)  Result Date: 08/14/2020 CLINICAL DATA:  87 year old female with a history of stroke EXAM: BILATERAL CAROTID DUPLEX ULTRASOUND TECHNIQUE: Pearline Cables scale imaging, color Doppler and duplex ultrasound were performed of bilateral carotid and vertebral arteries in the neck. COMPARISON:  None. FINDINGS: Criteria: Quantification of carotid stenosis is based on velocity parameters that correlate the residual internal carotid diameter with NASCET-based stenosis levels, using the diameter of the distal internal carotid lumen as the denominator for stenosis measurement. The following velocity measurements were obtained: RIGHT ICA:  Systolic 90 cm/sec, Diastolic 19 cm/sec CCA:  373 cm/sec SYSTOLIC ICA/CCA RATIO:  0.8 ECA:  97 cm/sec LEFT ICA:  Systolic 60 cm/sec, Diastolic 14 cm/sec CCA:  68 cm/sec SYSTOLIC ICA/CCA RATIO:  0.9 ECA:  87 cm/sec Right Brachial SBP: Not acquired Left Brachial SBP: Not acquired RIGHT CAROTID ARTERY: No significant calcifications of the right common carotid artery. Intermediate waveform maintained. Heterogeneous and partially calcified plaque at the right carotid bifurcation. No significant lumen shadowing. Low resistance waveform of the right ICA. No significant tortuosity. RIGHT VERTEBRAL ARTERY:  Antegrade flow with low resistance waveform. LEFT CAROTID ARTERY: No significant calcifications of the left common carotid artery. Intermediate waveform maintained. Heterogeneous and partially calcified plaque at the left carotid bifurcation without significant lumen shadowing. Low resistance waveform of the left ICA. No significant tortuosity. LEFT VERTEBRAL ARTERY:  Antegrade flow with low resistance waveform. IMPRESSION: Color duplex indicates minimal heterogeneous and calcified plaque, with no hemodynamically significant stenosis by duplex criteria in the extracranial cerebrovascular circulation. Signed, Dulcy Fanny. Dellia Nims, RPVI [facility] Radiology Electronically Signed   By: Corrie Mckusick D.O.   On: 08/14/2020 13:44   ECHOCARDIOGRAM COMPLETE  Result Date: 08/14/2020    ECHOCARDIOGRAM REPORT   Patient Name:   CURLIE SITTNER [facility] Date of Exam: 08/14/2020 Medical Rec #:  [id number]       Height:       61.0 in Accession #:    [id number]      Weight:       114.8 lb Date of Birth:  1926-10-25        BSA:          1.492 m Patient Age:  89 years        BP:           143/66 mmHg Patient Gender: F               HR:           60 bpm. Exam Location:  [facility] Procedure: 2D Echo, Cardiac Doppler and Color Doppler Indications:    Stroke 434.91 / I163.9  History:        Patient has prior history of Echocardiogram examinations.                 Pacemaker, COPD, Arrythmias:Atrial Fibrillation; Risk                 Factors:Hypertension. Long term (current) use of anticoagulants,                 S/P TAVR (transcatheter aortic valve replacement).  Sonographer:    Alvino Chapel RCS Referring Phys: [place]  1. Left ventricular ejection fraction, by estimation, is 60 to 65%. The left ventricle has normal function. The left ventricle has no regional wall motion abnormalities. There is mild left ventricular hypertrophy. Left ventricular diastolic parameters are  indeterminate.  2. Right ventricular systolic function is normal. The right ventricular size is normal. There is normal pulmonary artery systolic pressure. The estimated right ventricular systolic pressure is 13.0 mmHg.  3. Left atrial size was moderately dilated.  4. Right atrial size was severely dilated.  5. The mitral valve is abnormal. No evidence of mitral valve regurgitation. Moderate mitral annular calcification.  6. There is a 29 mm Sapien prosthetic, stented (TAVR) valve present in the aortic position. Aortic valve regurgitation is not visualized. Echo findings are consistent with normal structure and function of the aortic valve prosthesis. Aortic valve mean gradient measures 6.0 mmHg.  7. The inferior vena cava is normal in size with greater than 50% respiratory variability, suggesting right atrial pressure of 3 mmHg. FINDINGS  Left Ventricle: Left ventricular ejection fraction, by estimation, is 60 to 65%. The left ventricle has normal function. The left ventricle has no regional wall motion abnormalities. The left ventricular internal cavity size was normal in size. There is  mild left ventricular hypertrophy. Left ventricular diastolic parameters are indeterminate. Right Ventricle: The right ventricular size is normal. No increase in right ventricular wall thickness. Right ventricular systolic function is normal. There is normal pulmonary artery systolic pressure. The tricuspid regurgitant velocity is 2.70 m/s, and  with an assumed right atrial pressure of 3 mmHg, the estimated right ventricular systolic pressure is 86.5 mmHg. Left Atrium: Left atrial size was moderately dilated. Right Atrium: Right atrial size was severely dilated. Pericardium: There is no evidence of pericardial effusion. Mitral Valve: The mitral valve is abnormal. Moderate mitral annular calcification. No evidence of mitral valve regurgitation. Tricuspid Valve: The tricuspid valve is normal in structure. Tricuspid valve  regurgitation is trivial. Aortic Valve: The aortic valve has been repaired/replaced. Aortic valve regurgitation is not visualized. Aortic valve mean gradient measures 6.0 mmHg. There is a 29 mm Sapien prosthetic, stented (TAVR) valve present in the aortic position. Echo findings are consistent with normal structure and function of the aortic valve prosthesis. Pulmonic Valve: The pulmonic valve was not well visualized. Pulmonic valve regurgitation is not visualized. Aorta: The aortic root is normal in size and structure. Venous: The inferior vena cava is normal in size with greater than 50% respiratory variability, suggesting right atrial pressure of 3 mmHg. IAS/Shunts:  The interatrial septum was not well visualized.  LEFT VENTRICLE PLAX 2D LVIDd:         3.70 cm  Diastology LVIDs:         2.50 cm  LV e' medial:    6.53 cm/s LV PW:         1.10 cm  LV E/e' medial:  18.4 LV IVS:        1.20 cm  LV e' lateral:   6.74 cm/s LVOT diam:     1.50 cm  LV E/e' lateral: 17.8 LV SV:         29 LV SV Index:   20 LVOT Area:     1.77 cm  RIGHT VENTRICLE RV S prime:     10.40 cm/s TAPSE (M-mode): 1.5 cm LEFT ATRIUM             Index       RIGHT ATRIUM           Index LA diam:        4.00 cm 2.68 cm/m  RA Area:     27.20 cm LA Vol (A2C):   72.5 ml 48.60 ml/m RA Volume:   96.00 ml  64.35 ml/m LA Vol (A4C):   53.3 ml 35.73 ml/m LA Biplane Vol: 68.1 ml 45.65 ml/m  AORTIC VALVE AV Mean Grad: 6.0 mmHg LVOT Vmax:    79.20 cm/s LVOT Vmean:   48.200 cm/s LVOT VTI:     0.165 m  AORTA Ao Root diam: 2.70 cm MITRAL VALVE                TRICUSPID VALVE MV Area (PHT): 2.39 cm     TR Peak grad:   29.2 mmHg MV Decel Time: 318 msec     TR Vmax:        270.00 cm/s MV E velocity: 120.00 cm/s MV A velocity: 29.50 cm/s   SHUNTS MV E/A ratio:  4.07         Systemic VTI:  0.16 m                             Systemic Diam: 1.50 cm Oswaldo Milian MD Electronically signed by Oswaldo Milian MD Signature Date/Time: 08/14/2020/1:19:31 PM     Final     Scheduled Meds: .  stroke: mapping our early stages of recovery book   Does not apply Once  . arformoterol  15 mcg Nebulization BID  . budesonide  0.25 mg Nebulization BID  . docusate sodium  100 mg Oral BID  . famotidine  20 mg Oral QHS  . folic acid  1 mg Oral Daily  . iron polysaccharides  150 mg Oral Daily  . levothyroxine  25 mcg Oral Daily  . memantine  10 mg Oral BID  . metoprolol tartrate  25 mg Oral BID  . pantoprazole  40 mg Oral BID  . umeclidinium bromide  1 puff Inhalation Daily   Continuous Infusions:   LOS: 1 day    Time spent: 30 minutes    Barton Dubois, MD [facility]   To contact the attending provider between 7A-7P or the covering provider during after hours 7P-7A, please log into the web site [email or web address] and access using universal [place] password for that web site. If you do not have the password, please call the hospital operator.  08/15/2020, 4:34 PM

## 2020-08-15 NOTE — Progress Notes (Signed)
Pt's daughter reports that pt "spit up" some of her pills from this am, most notably her iron tablet. Emesis was described as frothy with pill fragments, but only identifiable one was the iron. Pt now c/o sore throat. Taking sips of water and juice but refusing lunch tray. Pt did eat late breakfast. Pt denies any n/v at present.

## 2020-08-15 NOTE — Consult Note (Addendum)
[place] A. Merlene Laughter, MD     [email or web address]          Crystal Cooper is an 87 y.o. female.   ASSESSMENT/PLAN: 1.  Acute ischemic stroke presenting with aphasia/acute altered mental status in the setting of atrial fibrillation.  The patient typically should be on anticoagulation but given recurrent severe GI bleed and her age, this is not recommended at this time. 2.  Vascular dementia 3.  Recurrent GI bleed  This is a 87 year old white female who presents with altered mental status and thought to have had aphasia new in the setting of chronic atrial fibrillation.  The patient has had repeat GI bleed in the past with the most recent one being about 3 months ago December 2021.  Because of the GI bleed seem to be multiple including gastric ulcers and apparent history of gastric AVM.  The family and the patient's gastroenterologist have elected to take the patient off anticoagulation.  Her last bleed occurred while while she was on aspirin.  She previously has had bleeding episodes on anticoagulation.  The family continues to be very reluctant to place the patient on any type of antiplatelet or anticoagulation given her previous history of recurrent bleeding there is a baseline history of dementia which limits the patient's history.  Review of system is not obtainable.    GENERAL: There is a thin very pleasant female who is doing fine at this time.  HEENT: Neck is supple no trauma noted.  There is significant hearing impairment.  ABDOMEN: soft  EXTREMITIES: No edema; there is significant arthritic changes noted diffusely.  There is mild ecchymosis noted of the forearms bilaterally.  BACK: Normal  SKIN: Normal by inspection.    MENTAL STATUS: She is awake and alert but disoriented and cannot provide a history.  She does follow commands briskly.  There is no dysarthria noted.  She does speak in full sentences but nonsensical suggestive of receptive  aphasia.  CRANIAL NERVES: Pupils are equal, round and reactive to light and accomodation; extra ocular movements are full, there is no significant nystagmus; visual fields are full; upper and lower facial muscles are normal in strength and symmetric, there is no flattening of the nasolabial folds; tongue is midline; uvula is midline; shoulder elevation is normal.  MOTOR: Tone and bulk are normal throughout.  She has antigravity strength in all 4 extremities.  She has significant drift in all 4 extremities.  COORDINATION: Left finger to nose is normal, right finger to nose is normal, No rest tremor; no intention tremor; no postural tremor; no bradykinesia.  SENSATION: Normal to pain.  NIHSS 2,2,2,2,2, 2 equal 12.     Blood pressure (!) 147/75, pulse 63, temperature 98 F (36.7 C), temperature source Oral, resp. rate 17, height 5\' 1"  (1.549 m), weight 52.1 kg, SpO2 97 %.  Past Medical History:  Diagnosis Date  . Acute CVA (cerebrovascular accident) ([place]) 08/27/2019  . Acute GI bleeding 06/22/2020  . Acute renal failure superimposed on stage 3b chronic kidney disease ([place])   . AKI (acute kidney injury) ([place])   . Anemia    years ago  . Angio-edema, initial encounter 08/23/2019  . Aortic stenosis   . Arthritis   . Asthma   . Atrial fibrillation ([place])   . Blood transfusion without reported diagnosis   . CHF (congestive heart failure) ([place]) 11/2014  . CKD (chronic kidney disease)    had many uti this year   . Closed left hip fracture,  initial encounter ([place]) 03/09/2020  . Dementia ([place])   . Family history of adverse reaction to anesthesia    2 daughters would have N/V  . Gastric AVM   . GERD (gastroesophageal reflux disease)    was seen by Gi for bleeding ulcer which was cauterized   . GI bleed   . Glaucoma   . Hearing loss   . Heart murmur    Rheumatic fever whe she was young,  sees Dr. Stanford Breed in [place]  . HTN (hypertension)   . Hypokalemia   . Myocardial infarction ([place])     in her 66 when she had two heart attacks   . PNA (pneumonia) 08/25/2019  . Pneumonia    stayed from friday to sunday in august  . Presence of permanent cardiac pacemaker    placed about 4 years ago  . Prolapsing mitral leaflet syndrome   . Shortness of breath dyspnea    with exertion  . Stroke ([place])   . SVT (supraventricular tachycardia) (H[facility])    S/P ablation of AVNRT in 2003    Past Surgical History:  Procedure Laterality Date  . APPENDECTOMY    . BIOPSY  04/07/2018   Procedure: BIOPSY;  Surgeon: Jerene Bears, MD;  Location: [facility] ENDOSCOPY;  Service: Gastroenterology;;  . BIOPSY  02/19/2020   Procedure: BIOPSY;  Surgeon: Harvel Quale, MD;  Location: [facility] ENDO SUITE;  Service: Gastroenterology;;  gastric   . BLADDER SURGERY    . CARDIAC CATHETERIZATION N/A 09/26/2015   Procedure: Right/Left Heart Cath and Coronary Angiography;  Surgeon: Burnell Blanks, MD;  Location: [place] CV LAB;  Service: Cardiovascular;  Laterality: N/A;  . CARDIAC SURGERY    . CARDIOVERSION N/A 03/02/2016   Procedure: CARDIOVERSION;  Surgeon: Thayer Headings, MD;  Location: [facility] ENDOSCOPY;  Service: Cardiovascular;  Laterality: N/A;  . ENTEROSCOPY  02/19/2020   Procedure: ENTEROSCOPY;  Surgeon: Harvel Quale, MD;  Location: [facility] ENDO SUITE;  Service: Gastroenterology;;  . EP IMPLANTABLE DEVICE N/A 10/26/2015   Procedure: Pacemaker Implant;  Surgeon: Will Meredith Leeds, MD;  Location: [place] CV LAB;  Service: Cardiovascular;  Laterality: N/A;  . ESOPHAGOGASTRODUODENOSCOPY (EGD) WITH PROPOFOL N/A 04/07/2018   Procedure: ESOPHAGOGASTRODUODENOSCOPY (EGD) WITH PROPOFOL;  Surgeon: Jerene Bears, MD;  Location: [facility] ENDOSCOPY;  Service: Gastroenterology;  Laterality: N/A;  . ESOPHAGOGASTRODUODENOSCOPY (EGD) WITH PROPOFOL N/A 10/12/2019   Procedure: ESOPHAGOGASTRODUODENOSCOPY (EGD) WITH PROPOFOL;  Surgeon: Danie Binder, MD;  Location[facility] ENDO SUITE;  Service: Endoscopy;  Laterality: N/A;   . EYE SURGERY Bilateral    cataract surgery  . FLEXIBLE SIGMOIDOSCOPY  02/19/2020   Procedure: FLEXIBLE SIGMOIDOSCOPY;  Surgeon: Harvel Quale, MD;  [facility] END[facility];  Service: Gastroenterology;;  . HIP FRACTURE SURGERY Left 02/2020  . HOT HEMOSTASIS N/A 04/07/2018   Procedure: HOT HEMOSTASIS (ARGON PLASMA COAGULATION/BICAP);  Surgeon: Jerene Bears, MD;  Location: [facility] ENDOSCOPY;  Service: Gastroenterology;  Laterality: N/A;  . HOT HEMOSTASIS  10/12/2019   Procedure: HOT HEMOSTASIS (ARGON PLASMA COAGULATION/BICAP);  Surgeon: Danie Binder, MD;  Location: [facility] ENDO SU[facility];  Service: Endoscopy;;  . INTRAMEDULLARY (IM) NAIL INTERTROCHANTERIC Left 03/10/2020   Procedure: OPEN TREATMENT INTERNAL FIXATION LEFT HIP (WITH GAMMA NAIL);  Surgeon: Carole Civil, MD;  Location: [facility] ORS;  Service: Orthopedics;  Laterality: Left;  . NASAL SINUS SURGERY    . PACEMAKER INSERTION    . TEE WITHOUT CARDIOVERSION N/A 10/25/2015   Procedure: TRANSESOPHAGEAL ECHOCARDIOGRAM (TEE);  Surgeon: Burnell Blanks,  MD;  Location: [facility] OR;  Service: Open Heart Surgery;  Laterality: N/A;  . TRANSCATHETER AORTIC VALVE REPLACEMENT, TRANSFEMORAL Right 10/25/2015   Procedure: TRANSCATHETER AORTIC VALVE REPLACEMENT, TRANSFEMORAL;  Surgeon: Burnell Blanks, MD;  Location: [place];  Service: Open Heart Surgery;  Laterality: Right;    Family History  Problem Relation Age of Onset  . Hypertension Mother   . Pneumonia Father   . Stomach cancer Brother   . Stroke Brother   . AAA (abdominal aortic aneurysm) Brother   . Cervical cancer Daughter   . Heart attack Neg Hx   . Colon cancer Neg Hx   . Esophageal cancer Neg Hx     Social History:  reports that she has never smoked. She has never used smokeless tobacco. She reports that she does not drink alcohol and does not use drugs.  Allergies:  Allergies  Allergen Reactions  . Hctz [Hydrochlorothiazide] Other (See Comments)    Pt was ill and this  affected her kidneys   . Aspirin Other (See Comments)    Cardiologist said the patient is to not take this  . Codeine Other (See Comments)    Made the patient feel ill, has not had any problems since 1977    Medications: Prior to Admission medications   Medication Sig Start Date End Date Taking? Authorizing Provider  albuterol (PROAIR HFA) 108 (90 Base) MCG/ACT inhaler Inhale 2 puffs into the lungs every 4 (four) hours as needed for wheezing or shortness of breath. 08/05/19  Yes RakesConnye Burkitt, FNP  Biotin 10 MG CAPS Take 10 mg by mouth daily.    Yes [provider]  Budeson-Glycopyrrol-Formoterol (BREZTRI AEROSPHERE) 160-9-4.8 MCG/ACT AERO Inhale 2 Inhalers into the lungs in the morning and at bedtime. 11/27/19  Yes Ivy Lynn, NP  Calcium Carb-Cholecalciferol (CALTRATE 600+D3) 600-800 MG-UNIT TABS Take 1 tablet by mouth daily.   Yes [provider]  docusate sodium (COLACE) 100 MG capsule Take 1 capsule (100 mg total) by mouth 2 (two) times daily. 03/15/20  Yes Barton Dubois, MD  EUTHYROX 25 MCG tablet Take 1 tablet by mouth once daily 03/07/20  Yes Stacks, [name], MD  famotidine (PEPCID) 20 MG tablet Take 1 tablet (20 mg total) by mouth at bedtime. 12/21/19 12/20/20 Yes Stacks, [name], MD  folic acid (FOLVITE) 1 MG tablet Take 1 tablet (1 mg total) by mouth daily. 06/21/20  Yes Kilroy, Luke K, PA-C  furosemide (LASIX) 20 MG tablet Take 4 tablets (80 mg total) by mouth daily. 02/22/20  Yes Lelon Perla, MD  iron polysaccharides (NIFEREX) 150 MG capsule Take 1 capsule (150 mg total) by mouth daily. 06/29/20  Yes Lelon Perla, MD  levalbuterol Penne Lash) 1.25 MG/3ML nebulizer solution Take 1.25 mg by nebulization every 4 (four) hours as needed for wheezing. Dx J45.909 07/07/20  Yes [name], MD  memantine (NAMENDA) 10 MG tablet Take 1 tablet (10 mg total) by mouth 2 (two) times daily. 07/26/20  Yes Frann Rider, NP  metoprolol tartrate (LOPRESSOR) 25 MG tablet Take  1 tablet (25 mg total) by mouth 2 (two) times daily. 05/20/19  Yes Camnitz, Will Hassell Done, MD  pantoprazole (PROTONIX) 40 MG tablet Take 1 tablet by mouth twice daily 07/13/20  Yes Stacks, [name], MD  potassium chloride SA (KLOR-CON) 10 MEQ tablet Take 2 tablets (20 mEq total) by mouth 2 (two) times daily. Patient taking differently: Take 10 mEq by mouth 2 (two) times daily. 02/09/20  Yes [name], MD  vitamin  B-12 (CYANOCOBALAMIN) 1000 MCG tablet Take 1,000 mcg by mouth daily.   Yes [provider]    Scheduled Meds: .  stroke: mapping our early stages of recovery book   Does not apply Once  . arformoterol  15 mcg Nebulization BID  . budesonide  0.25 mg Nebulization BID  . docusate sodium  100 mg Oral BID  . famotidine  20 mg Oral QHS  . folic acid  1 mg Oral Daily  . furosemide  40 mg Oral Daily  . iron polysaccharides  150 mg Oral Daily  . levothyroxine  25 mcg Oral Daily  . memantine  10 mg Oral BID  . metoprolol tartrate  25 mg Oral BID  . pantoprazole  40 mg Oral BID  . umeclidinium bromide  1 puff Inhalation Daily   Continuous Infusions: PRN Meds:.acetaminophen **OR** acetaminophen (TYLENOL) oral liquid 160 mg/5 mL **OR** acetaminophen, albuterol, phenol     Results for orders placed or performed during the hospital encounter of 08/13/20 (from the past 48 hour(s))  Hemoglobin A1c     Status: Abnormal   Collection Time: 08/14/20  6:51 AM  Result Value Ref Range   Hgb A1c MFr Bld 4.7 (L) 4.8 - 5.6 %    Comment: (NOTE) Pre diabetes:          5.7%-6.4%  Diabetes:              >6.4%  Glycemic control for   <7.0% adults with diabetes    Mean Plasma Glucose 88.19 mg/dL    Comment: Performed at [facility], [place] [address], [place], [place] [address]  Lipid panel     Status: None   Collection Time: 08/14/20  6:51 AM  Result Value Ref Range   Cholesterol 134 0 - 200 mg/dL   Triglycerides 96 <150 mg/dL   HDL 49 >40 mg/dL   Total CHOL/HDL Ratio 2.7 RATIO    VLDL 19 0 - 40 mg/dL   LDL Cholesterol 66 0 - 99 mg/dL    Comment:        Total Cholesterol/HDL:CHD Risk Coronary Heart Disease Risk Table                     Men   Women  1/2 Average Risk   3.4   3.3  Average Risk       5.0   4.4  2 X Average Risk   9.6   7.1  3 X Average Risk  23.4   11.0        Use the calculated Patient Ratio above and the CHD Risk Table to determine the patient's CHD Risk.        ATP III CLASSIFICATION (LDL):  <100     mg/dL   Optimal  100-129  mg/dL   Near or Above                    Optimal  130-159  mg/dL   Borderline  160-189  mg/dL   High  >190     mg/dL   Very High Performed at [facility] [place], [place] [address]   Glucose, capillary     Status: Abnormal   Collection Time: 08/15/20  7:27 AM  Result Value Ref Range   Glucose-Capillary 102 (H) 70 - 99 mg/dL    Comment: Glucose reference range applies only to samples taken after fasting for at least 8 hours.    Studies/Results:  HEAD NECK CTA EXAM: CT ANGIOGRAPHY HEAD AND NECK  CT PERFUSION BRAIN  TECHNIQUE: Multidetector CT imaging of the head and neck was performed using the standard protocol during bolus administration of intravenous contrast. Multiplanar CT image reconstructions and MIPs were obtained to evaluate the vascular anatomy. Carotid stenosis measurements (when applicable) are obtained utilizing NASCET criteria, using the distal internal carotid diameter as the denominator.  Multiphase CT imaging of the brain was performed following IV bolus contrast injection. Subsequent parametric perfusion maps were calculated using RAPID software.  CONTRAST:  186mL OMNIPAQUE IOHEXOL 350 MG/ML SOLN  COMPARISON:  None.  FINDINGS: CTA NECK FINDINGS  Aortic arch: Standard 3 vessel aortic arch with mild atherosclerotic plaque and widely patent arch vessel origins.  Right carotid system: Patent with mild-to-moderate calcified and soft plaque at the  carotid bifurcation not resulting in significant stenosis.  Left carotid system: Patent with mild-to-moderate calcified plaque at the carotid bifurcation not resulting in significant stenosis.  Vertebral arteries: Patent without evidence of stenosis or dissection. Mildly to moderately dominant left vertebral artery.  Skeleton: Advanced cervical disc degeneration with evidence of severe spinal and neural foraminal stenosis at C5-6.  Other neck: No evidence of cervical lymphadenopathy or mass.  Upper chest: Motion artifact through the lung apices with scattered small ground-glass and nodular densities in the included portions of the upper lobes likely related to the suspected bilateral upper lobe pneumonia on 08/04/2020 chest radiographs.  Review of the MIP images confirms the above findings  CTA HEAD FINDINGS  Anterior circulation: The internal carotid arteries are patent from skull base to carotid termini with mild atherosclerotic plaque bilaterally not resulting in significant stenosis. There is occlusion of the left M1 segment with good distal collateralization. The right MCA and both ACAs are patent without evidence of a significant proximal stenosis. No aneurysm is identified.  Posterior circulation: Intracranial vertebral arteries are widely patent to the basilar. Patent PICA, AICA, and SCA origins are seen bilaterally with the right PICA and right AICA sharing a common trunk. The basilar artery is widely patent. There are right larger than left posterior communicating arteries with a fetal origin of the right PCA. Both PCAs are patent without evidence of a significant proximal stenosis. No aneurysm is identified.  Venous sinuses: Patent.  Anatomic variants: Fetal right PCA.  Review of the MIP images confirms the above findings  CT Brain Perfusion Findings:  ASPECTS: 10  CBF (<30%) Volume: 0 mL  Perfusion (Tmax>6.0s) volume: 26 mL  Mismatch  Volume: 26 mL  Infarction Location: No core infarct identified by CTP. Ischemic penumbra in the left MCA territory primarily in the left temporoparietal junction region.  IMPRESSION: 1. 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 2. 26 mL ischemic penumbra in the left MCA territory without evidence of a core infarct. 3. Mild-to-moderate cervical carotid atherosclerosis without significant stenosis. 4. Aortic Atherosclerosis (ICD10-I70.0).     TTE IMPRESSIONS    1. Left ventricular ejection fraction, by estimation, is 60 to 65%. The  left ventricle has normal function. The left ventricle has no regional  wall motion abnormalities. There is mild left ventricular hypertrophy.  Left ventricular diastolic parameters  are indeterminate.  2. Right ventricular systolic function is normal. The right ventricular  size is normal. There is normal pulmonary artery systolic pressure. The  estimated right ventricular systolic pressure is 26.8 mmHg.  3. Left atrial size was moderately dilated.  4. Right atrial size was severely dilated.  5. The mitral valve is abnormal. No  evidence of mitral valve  regurgitation. Moderate mitral annular calcification.  6. There is a 29 mm Sapien prosthetic, stented (TAVR) valve present in  the aortic position. Aortic valve regurgitation is not visualized. Echo  findings are consistent with normal structure and function of the aortic  valve prosthesis. Aortic valve mean  gradient measures 6.0 mmHg.  7. The inferior vena cava is normal in size with greater than 50%  respiratory variability, suggesting right atrial pressure of 3 mmHg.     The head CT scan is reviewed in person in shows diffuse atrophy and the severe confluent hypodensity consistent with severe leukoencephalopathy. There is evidence of encephalomalacia involving the inferior  aspect of the right cerebellum consistent with remote infarct.    Areon Cocuzza A. Merlene Laughter, M.D.   Diplomate, Tax adviser of Psychiatry and Neurology ( Neurology). 08/15/2020, 5:30 PM

## 2020-08-15 NOTE — TOC Initial Note (Addendum)
Transition of Care [facility]) - Initial/Assessment Note    Patient Details  Name: Crystal Cooper MRN: [id number] Date of Birth: 1926-07-22  [facility] CM/SW Contact:    Boneta Lucks, RN Phone Number: 08/15/2020, 3:42 PM  Clinical Narrative:     Patient lives at home with caregiver.  TOC spoke to daughter Quita Skye, She and her sister share POA.  PT is recommending SNF. Quita Skye is in agreement.  FL2 sent out for bed offers. TOC to follow.              Addendum : PNC offered, Daughter accepted  Expected Discharge Plan: [place]    Patient Goals and CMS Choice   CMS Medicare.gov Compare Post Acute Care list provided to:: Patient Represenative (must comment) Choice offered to / list presented to : Adult Children  Expected Discharge Plan and Services Expected Discharge Plan: [place] Acute Care Choice: Nursing Home Living arrangements for the past 2 months: Single Family Home                   Prior Living Arrangements/Services Living arrangements for the past 2 months: Single Family Home Lives with:: Other (Comment) (caregiver) Patient language and need for interpreter reviewed:: Yes        Need for Family Participation in Patient Care: Yes (Comment) Care giver support system in place?: Yes (comment) Current home services: DME Criminal Activity/Legal Involvement Pertinent to Current Situation/Hospitalization: No - Comment as needed  Activities of Daily Living Home Assistive Devices/Equipment: Wheelchair,Walker (specify type),Bedside commode/3-in-1 ADL Screening (condition at time of admission) Patient's cognitive ability adequate to safely complete daily activities?: No Is the patient deaf or have difficulty hearing?: Yes Does the patient have difficulty seeing, even when wearing glasses/contacts?: No Does the patient have difficulty concentrating, remembering, or making decisions?: Yes Patient able to express need for  assistance with ADLs?: No Does the patient have difficulty dressing or bathing?: Yes Independently performs ADLs?: No Communication: Independent,Needs assistance Is this a change from baseline?: Pre-admission baseline Dressing (OT): Needs assistance Is this a change from baseline?: Pre-admission baseline Grooming: Needs assistance Is this a change from baseline?: Pre-admission baseline Feeding: Independent Bathing: Dependent Is this a change from baseline?: Pre-admission baseline Toileting: Dependent Is this a change from baseline?: Pre-admission baseline In/Out Bed: Dependent Is this a change from baseline?: Pre-admission baseline Walks in Home: Needs assistance Is this a change from baseline?: Pre-admission baseline Does the patient have difficulty walking or climbing stairs?: Yes Weakness of Legs: Both Weakness of Arms/Hands: Both  Permission Sought/Granted     Emotional Assessment      Orientation: : Oriented to Self Alcohol / Substance Use: Not Applicable Psych Involvement: No (comment)  Admission diagnosis:  Stroke-like symptoms [R29.90] Acute ischemic left MCA stroke [facility]) [I63.512] Patient Active Problem List   Diagnosis Date Noted  . Pressure injury of skin 08/14/2020  . Acute ischemic left MCA stroke ([place]) 08/14/2020  . Stroke-like symptoms 08/13/2020  . Rectal bleeding 07/07/2020  . Impairment of balance 06/28/2020  . Status post hip surgery   . Upper GI bleed 02/19/2020  . Acute blood loss anemia 02/19/2020  . CKD (chronic kidney disease) stage 3, GFR 30-59 ml/min (HCC) 02/19/2020  . Hematochezia 02/19/2020  . Iron deficiency anemia due to chronic blood loss 02/18/2020  . AVM (arteriovenous malformation) 02/18/2020  . COPD (chronic obstructive pulmonary disease) ([place]) 02/18/2020  . Dementia without behavioral disturbance ([place]) 02/18/2020  .  SOB (shortness of breath)   . COPD with acute exacerbation ([place])   . CAP (community acquired pneumonia) 02/12/2020   . Long term (current) use of anticoagulants   . Wheezing 09/23/2019  . Abnormal breath sounds 09/09/2019  . Hypokalemia 08/23/2019  . Reactive airway disease 03/24/2019  . Gastroesophageal reflux disease 03/24/2019  . GI bleed 05/12/2018  . 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 Diastolic dysfunction 74/82/7078  . Pacemaker 05/12/2018  . Iron deficiency anemia   . Melena   . Duodenal arteriovenous malformation   . Gastritis and gastroduodenitis   . Normocytic anemia 04/06/2018  . Osteoporosis 08/27/2016  . ASCVD (arteriosclerotic cardiovascular disease) 01/11/2016  . Asthma 01/11/2016  . Glaucoma 01/11/2016  . Hearing loss 01/11/2016  . Other nonrheumatic mitral valve disorders 01/11/2016  . Junctional bradycardia   . PAF (paroxysmal atrial fibrillation) ([place]) 09/06/2015  . Hyponatremia 12/17/2014  . Congestive heart failure ([place]) 12/12/2014  . Murmur 10/19/2011  . Essential hypertension 10/19/2011  . History of cardiac radiofrequency ablation (RFA) 10/19/2011   PCP:  [name], MD Pharmacy:   [facility] [address] [place] [address] Phone: [phone] Fax: [phone]   Readmission Risk Interventions Readmission Risk Prevention Plan 08/15/2020 10/11/2019  Transportation Screening Complete Complete  HRI or Home Care Consult - Complete  Palliative Care Screening - Not Applicable  Medication Review (RN Care Manager) Complete Complete  HRI or Home Care Consult Complete -  SW Recovery Care/Counseling Consult Complete -  Palliative Care Screening Not Applicable -  Skilled Nursing Facility Complete -  Some recent data might be hidden

## 2020-08-15 NOTE — Progress Notes (Signed)
SLP Cancellation Note  Patient Details Name: Crystal Cooper MRN: [id number] DOB: 10-Dec-1926   Cancelled treatment:       Reason Eval/Treat Not Completed: Other (comment); Order received for SLE, however unable to complete this date due to schedule restraints. SLP spoke with Pt and daughter. Daughter indicates that Pt lives at home with 24/7 care due to dementia, however she is able to communicate independently. She is currently complaining of a sore throat and was RN gave medicine. SLP will check back tomorrow for SLE.  Thank you,  Genene Churn, [place]    [place] 08/15/2020, 3:49 PM

## 2020-08-15 NOTE — Progress Notes (Signed)
Pt assisted up to chair by PT, tolerated well. Pt remains oriented to self only, does not answer questions, states, "I got babies at home to tend and ya'll ain't doing me right."

## 2020-08-15 NOTE — Evaluation (Signed)
Occupational Therapy Evaluation Patient Details Name: Crystal Cooper MRN: [id number] DOB: 07-18-1926 Today's Date: 08/15/2020    History of Present Illness HPI: Crystal Cooper is a 87 y.o. female with medical history significant of CKD stage 3b, PAF, dementia, GI bleed, pacemaker implantation, HTN, prior hx of stroke, GERD and chronic diastolic HF; who presented to ED secondary to right side leaning, slow to respond and inability to walk.  Per caregiver patient symptoms worsen and may help to become disoriented.  She was last seen normal around 7:45 in the morning.  By the time the patient presented to the emergency department, her history was confirmed she was outside the therapeutic window for TPA.  Patient denies chest pain, fever, chills, nausea, vomiting, dysuria, hematuria, melena, hematochezia, abdominal pain   Clinical Impression   Pt agreeable to OT evaluation. Pt required maximal assist for supine to sit bed mobility. Minimal to moderate assist for stand pivot transfer from EOB to chair using RW. Pt demonstrated moderate difficulty following one step directions but was able to talk with this OT at times. Pt was able to complete hair brushing with s/u assist. Pt would benefit from further acute OT services to address strength and endurance to increase ADL levels prior to d/c. Recommended for SNF if pt cannot proved 24/7 support. Home health OT recommended with family can provide 24/7 assist.     Follow Up Recommendations  SNF;Home health OT;Supervision/Assistance - 24 hour    Equipment Recommendations  None recommended by OT           Precautions / Restrictions Precautions Precautions: Fall Restrictions Weight Bearing Restrictions: No      Mobility Bed Mobility Overal bed mobility: Needs Assistance Bed Mobility: Supine to Sit     Supine to sit: Max assist          Transfers Overall transfer level: Needs assistance Equipment used: Rolling walker (2  wheeled) Transfers: Stand Pivot Transfers Sit to Stand: Mod assist Stand pivot transfers: Min assist;Mod assist       General transfer comment: Completed from EOB to chair.    Balance Overall balance assessment: Needs assistance Sitting-balance support: No upper extremity supported Sitting balance-Leahy Scale: Fair     Standing balance support: Bilateral upper extremity supported Standing balance-Leahy Scale: Fair                             ADL either performed or assessed with clinical judgement   ADL Overall ADL's : Needs assistance/impaired     Grooming: Set up;Brushing hair;Sitting Grooming Details (indicate cue type and reason): Able to brush hair with s/u assist seated in chair.                 Toilet Transfer: Stand-pivot;Minimal assistance;Moderate assistance;RW Toilet Transfer Details (indicate cue type and reason): Simulated via stand pivot to chair from EOB using RW. Minimal to moderate assist for the transfer.           General ADL Comments: slow labored movement     Vision Baseline Vision/History: No visual deficits                  Pertinent Vitals/Pain Pain Assessment: Faces Faces Pain Scale: Hurts little more Pain Location:  (Unknown. During bed mobility.) Pain Descriptors / Indicators: Grimacing;Guarding Pain Intervention(s): Monitored during session     Hand Dominance Right   Extremity/Trunk Assessment Upper Extremity Assessment Upper Extremity Assessment: Generalized weakness   Lower  Extremity Assessment Lower Extremity Assessment: Defer to PT evaluation       Communication Communication Communication: HOH;Expressive difficulties;Receptive difficulties (Posible expressive and receptive difficulties due to difficulty at time with following 1 step directions.)   Cognition Arousal/Alertness: Awake/alert Behavior During Therapy: WFL for tasks assessed/performed Overall Cognitive Status: History of cognitive  impairments - at baseline                                                      [place] expects to be discharged to:: Skilled nursing facility Living Arrangements: Alone Available Help at Discharge: Family;Personal care attendant;Available 24 hours/day Type of Home: House Home Access: Stairs to enter [employer] of Steps: 3 Entrance Stairs-Rails: Left Home Layout: One level     Bathroom Shower/Tub: Teacher, early years/pre: Standard Bathroom Accessibility: Yes   Home Equipment: Cane - single point;Walker - 2 wheels;Grab bars - toilet;Grab bars - tub/shower   Additional Comments: Pt history taken from document review due to being a poor historian with minimal communication      Prior Functioning/Environment Level of Independence: Needs assistance  Gait / Transfers Assistance Needed: pt ambulates household distances with RW, gt belt and always someone with her ; family and caregivers assist ADL's / Homemaking Assistance Needed: family completes Communication / Swallowing Assistance Needed: normally [facility] but communicates Comments: Pt history taken from document review due to being a poor historian with minimal communication        OT Problem List: Decreased strength;Decreased range of motion;Decreased activity tolerance;Impaired balance (sitting and/or standing)      OT Treatment/Interventions: Self-care/ADL training;Therapeutic exercise;Therapeutic activities;Patient/family education;Balance training    OT Goals(Current goals can be found in the care plan section) Acute Rehab OT Goals Patient Stated Goal: Pt did not vocalize goal. OT Goal Formulation: Patient unable to participate in goal setting Time For Goal Achievement: 08/29/20 Potential to Achieve Goals: Fair  OT Frequency: Min 2X/week                     End of Session Equipment Utilized During Treatment: Gait belt;Rolling walker Nurse  Communication: Other (comment) (Nursing notified that pure wik was soiled and that new one was placed in the genral groin region when pt was seated in chair)  Activity Tolerance: Patient tolerated treatment well Patient left: in chair;with chair alarm set;with call bell/phone within reach  OT Visit Diagnosis: Unsteadiness on feet (R26.81);Muscle weakness (generalized) (M62.81)                Time: 3009-2330 OT Time Calculation (min): 28 min Charges:  OT General Charges $OT Visit: 1 Visit OT Evaluation $OT Eval Low Complexity: 1 Low  Willowdean Luhmann OT, MOT   Larey Seat 08/15/2020, 10:15 AM

## 2020-08-16 DIAGNOSIS — I480 Paroxysmal atrial fibrillation: Secondary | ICD-10-CM | POA: Diagnosis not present

## 2020-08-16 DIAGNOSIS — E440 Moderate protein-calorie malnutrition: Secondary | ICD-10-CM | POA: Diagnosis not present

## 2020-08-16 DIAGNOSIS — R195 Other fecal abnormalities: Secondary | ICD-10-CM | POA: Diagnosis present

## 2020-08-16 DIAGNOSIS — M159 Polyosteoarthritis, unspecified: Secondary | ICD-10-CM | POA: Diagnosis not present

## 2020-08-16 DIAGNOSIS — Z20822 Contact with and (suspected) exposure to covid-19: Secondary | ICD-10-CM | POA: Diagnosis not present

## 2020-08-16 DIAGNOSIS — I517 Cardiomegaly: Secondary | ICD-10-CM | POA: Diagnosis not present

## 2020-08-16 DIAGNOSIS — I639 Cerebral infarction, unspecified: Secondary | ICD-10-CM | POA: Diagnosis not present

## 2020-08-16 DIAGNOSIS — R5381 Other malaise: Secondary | ICD-10-CM | POA: Diagnosis not present

## 2020-08-16 DIAGNOSIS — J45998 Other asthma: Secondary | ICD-10-CM | POA: Diagnosis not present

## 2020-08-16 DIAGNOSIS — R140 Abdominal distension (gaseous): Secondary | ICD-10-CM | POA: Diagnosis not present

## 2020-08-16 DIAGNOSIS — M6281 Muscle weakness (generalized): Secondary | ICD-10-CM | POA: Diagnosis not present

## 2020-08-16 DIAGNOSIS — I69320 Aphasia following cerebral infarction: Secondary | ICD-10-CM | POA: Diagnosis not present

## 2020-08-16 DIAGNOSIS — N1832 Chronic kidney disease, stage 3b: Secondary | ICD-10-CM | POA: Diagnosis not present

## 2020-08-16 DIAGNOSIS — D649 Anemia, unspecified: Secondary | ICD-10-CM | POA: Diagnosis not present

## 2020-08-16 DIAGNOSIS — R2681 Unsteadiness on feet: Secondary | ICD-10-CM | POA: Diagnosis not present

## 2020-08-16 DIAGNOSIS — K922 Gastrointestinal hemorrhage, unspecified: Secondary | ICD-10-CM | POA: Diagnosis not present

## 2020-08-16 DIAGNOSIS — K921 Melena: Secondary | ICD-10-CM | POA: Diagnosis not present

## 2020-08-16 DIAGNOSIS — I482 Chronic atrial fibrillation, unspecified: Secondary | ICD-10-CM | POA: Diagnosis not present

## 2020-08-16 DIAGNOSIS — I700 Atherosclerosis of aorta: Secondary | ICD-10-CM | POA: Diagnosis not present

## 2020-08-16 DIAGNOSIS — J439 Emphysema, unspecified: Secondary | ICD-10-CM | POA: Diagnosis not present

## 2020-08-16 DIAGNOSIS — Z79899 Other long term (current) drug therapy: Secondary | ICD-10-CM | POA: Diagnosis not present

## 2020-08-16 DIAGNOSIS — K219 Gastro-esophageal reflux disease without esophagitis: Secondary | ICD-10-CM | POA: Diagnosis not present

## 2020-08-16 DIAGNOSIS — I10 Essential (primary) hypertension: Secondary | ICD-10-CM | POA: Diagnosis not present

## 2020-08-16 DIAGNOSIS — I4892 Unspecified atrial flutter: Secondary | ICD-10-CM | POA: Diagnosis not present

## 2020-08-16 DIAGNOSIS — I69398 Other sequelae of cerebral infarction: Secondary | ICD-10-CM | POA: Diagnosis not present

## 2020-08-16 DIAGNOSIS — G309 Alzheimer's disease, unspecified: Secondary | ICD-10-CM | POA: Diagnosis not present

## 2020-08-16 DIAGNOSIS — J45909 Unspecified asthma, uncomplicated: Secondary | ICD-10-CM | POA: Diagnosis not present

## 2020-08-16 DIAGNOSIS — C189 Malignant neoplasm of colon, unspecified: Secondary | ICD-10-CM | POA: Diagnosis not present

## 2020-08-16 DIAGNOSIS — I5032 Chronic diastolic (congestive) heart failure: Secondary | ICD-10-CM | POA: Diagnosis not present

## 2020-08-16 DIAGNOSIS — J449 Chronic obstructive pulmonary disease, unspecified: Secondary | ICD-10-CM | POA: Diagnosis not present

## 2020-08-16 DIAGNOSIS — K254 Chronic or unspecified gastric ulcer with hemorrhage: Secondary | ICD-10-CM | POA: Diagnosis not present

## 2020-08-16 DIAGNOSIS — L89152 Pressure ulcer of sacral region, stage 2: Secondary | ICD-10-CM

## 2020-08-16 DIAGNOSIS — H9193 Unspecified hearing loss, bilateral: Secondary | ICD-10-CM | POA: Diagnosis not present

## 2020-08-16 DIAGNOSIS — I130 Hypertensive heart and chronic kidney disease with heart failure and stage 1 through stage 4 chronic kidney disease, or unspecified chronic kidney disease: Secondary | ICD-10-CM | POA: Diagnosis not present

## 2020-08-16 DIAGNOSIS — I69392 Facial weakness following cerebral infarction: Secondary | ICD-10-CM | POA: Diagnosis not present

## 2020-08-16 DIAGNOSIS — J189 Pneumonia, unspecified organism: Secondary | ICD-10-CM | POA: Diagnosis not present

## 2020-08-16 DIAGNOSIS — Z950 Presence of cardiac pacemaker: Secondary | ICD-10-CM | POA: Diagnosis not present

## 2020-08-16 DIAGNOSIS — F0150 Vascular dementia without behavioral disturbance: Secondary | ICD-10-CM | POA: Diagnosis not present

## 2020-08-16 DIAGNOSIS — F0280 Dementia in other diseases classified elsewhere without behavioral disturbance: Secondary | ICD-10-CM | POA: Diagnosis not present

## 2020-08-16 DIAGNOSIS — H409 Unspecified glaucoma: Secondary | ICD-10-CM | POA: Diagnosis not present

## 2020-08-16 DIAGNOSIS — K6389 Other specified diseases of intestine: Secondary | ICD-10-CM | POA: Diagnosis not present

## 2020-08-16 LAB — RESP PANEL BY RT-PCR (FLU A&B, COVID) ARPGX2
Influenza A by PCR: NEGATIVE
Influenza B by PCR: NEGATIVE
SARS Coronavirus 2 by RT PCR: NEGATIVE

## 2020-08-16 MED ORDER — POTASSIUM CHLORIDE CRYS ER 10 MEQ PO TBCR
20.0000 meq | EXTENDED_RELEASE_TABLET | Freq: Every day | ORAL | 2 refills | Status: DC
Start: 2020-08-16 — End: 2020-11-22

## 2020-08-16 MED ORDER — FUROSEMIDE 40 MG PO TABS: 40.0000 mg | ORAL_TABLET | Freq: Every day | ORAL | Status: DC

## 2020-08-16 MED ORDER — ACETAMINOPHEN 325 MG PO TABS: 650.0000 mg | ORAL_TABLET | ORAL | Status: AC | PRN

## 2020-08-16 NOTE — Discharge Summary (Signed)
Physician Discharge Summary  Crystal Cooper:[id number] DOB: September 27, 1926 DOA: 08/13/2020  PCP: Crystal Fraise, MD  Admit date: 08/13/2020 Discharge date: 08/16/2020  Time spent: 35 minutes  Recommendations for Outpatient Follow-up:  1. Repeat basic metabolic panel to follow electrolytes 2. Recent literature includes antihypertensive treatment as needed 3. Outpatient follow-up with neurology service.   Discharge Diagnoses:  Principal Problem:   Stroke-like symptoms Active Problems:   Essential hypertension   PAF (paroxysmal atrial fibrillation) (HCC)   Pacemaker   COPD (chronic obstructive pulmonary disease) (HCC)   Dementia without behavioral disturbance (HCC)   Upper GI bleed   CKD (chronic kidney disease) stage 3, GFR 30-59 ml/min (HCC)   Pressure injury of skin   Stroke [facility])   Physical debility   Discharge Condition: Stable and improved.  Patient discharged to skilled nursing facility for further care of rehabilitation.  Code status: DNR/DNI  Diet recommendation: Heart healthy/low-sodium diet.  Filed Weights   08/13/20 1157  Weight: 52.1 kg    History of present illness:  Crystal Dao Priddyis a 87 y.o.femalewith medical history significant ofCKD stage 3b, PAF, dementia, GI bleed, pacemaker implantation, HTN, prior hx of stroke, GERD and chronic diastolic HF; who presented to ED secondary to right side leaning, slow to respond and inability to walk.Per caregiver patient symptoms worsen and may help to become disoriented. She was last seen normal around 7:45 in the morning. By the time the patient presented to the emergency department, her history was confirmed she was outside the therapeutic window for TPA. Patient is still not back to baseline; demonstrating aphasia, right-sided weakness and poor coordination.  Hospital Course:  1-Acute left ischemic MCA -Risk factors including hypertension, paroxysmal atrial fibrillation and age -Physical therapy and  occupational therapy has recommended to skilled nursing facility.  no dysarthria and good swallowing capacity appreciated. -No significant abnormalities on carotid Dopplers and stable 2D echo. -Patient status post pacemaker implantation and unable to have MRI done at [facility] hospital.  After discussing with neurology service no need to repeat further brain images at this point. -Due to life-threatening GI bleed in the past not a candidate for antiplatelet therapy or anticoagulation.  This has been discussed with neurology service and with family members, who are in agreement. -continue risk factors modifications and rehab.  2-paroxysmal atrial fibrillation -Currently sinus and rate controlled. -Continue the use of beta-blockers -Not on anticoagulation secondary to GI bleed in the past.  3-essential hypertension -Stable currently. -Resume home antihypertensive regimen -Continue heart healthy diet.  4-gastroesophageal flux disease/GI bleed -Continue PPI twice a day and Pepcid nightly.  5-chronic kidney disease a stage IIIb -Stable and at baseline -Continue monitoring renal function trend.  6-stage I pressure injury in her sacrum -Present on admission -No signs of superimposed infection.  7-dementia without behavioral disturbances -Continue constant reorientation -Continue the use of Namenda.  Procedures: See below for x-ray report 2D echo: No wall motion abnormalities, preserved ejection fraction.  Stable TAVR. Carotid Dopplers: Demonstrating minimal stenosis without significant hemodynamic vascular compromises.  Consultations:  Neurology service.  Discharge Exam: Vitals:   08/16/20 0507 08/16/20 0729  BP: 134/65   Pulse: 60   Resp: 17   Temp: (!) 97.2 F (36.2 C)   SpO2: 99% 99%    General exam: Alert, awake, and following commands. Daughter at bedside reporting no new changes in her mentation changes today; patient answering questions appropriately.  No fever, no  chest pain, no nausea, no vomiting, no shortness directed Respiratory system: Clear to  auscultation. Respiratory effort normal.  No wheezing, no crackles.  No using accessory muscles.  No requiring oxygen supplementation. Cardiovascular system: Rate controlled, positive click, no JVD, no rubs or gallops. Gastrointestinal system: Abdomen is nondistended, soft and nontender. No organomegaly or masses felt. Normal bowel sounds heard. Central nervous system: Cranial nerves grossly intact, continue to have right-sided muscle strength 3 out of 5 upper and lower extremities.    No dysarthria and swallowing well. Extremities: No cyanosis or clubbing. Skin: Stage I pressure injury appreciating her sacral area, no superimposed infection.  Present on admission. Psychiatry: Mood & affect appropriate.   Discharge Instructions   Discharge Instructions    (HEART FAILURE PATIENTS) Call MD:  Anytime you have any of the following symptoms: 1) 3 pound weight gain in 24 hours or 5 pounds in 1 week 2) shortness of breath, with or without a dry hacking cough 3) swelling in the hands, feet or stomach 4) if you have to sleep on extra pillows at night in order to breathe.   Complete by: As directed    Diet - low sodium heart healthy   Complete by: As directed    Discharge instructions   Complete by: As directed    Take medications as prescribed Maintain adequate hydration Follow-up heart healthy/low-sodium diet Check weight on daily basis Physical therapy rehabilitationconsultant facility protocol.   Increase activity slowly   Complete by: As directed    No dressing needed   Complete by: As directed      Allergies as of 08/16/2020      Reactions   Hctz [hydrochlorothiazide] Other (See Comments)   Pt was ill and this affected her kidneys   Aspirin Other (See Comments)   Cardiologist said the patient is to not take this   Codeine Other (See Comments)   Made the patient feel ill, has not had any problems  since 1977      Medication List    STOP taking these medications   vitamin B-12 1000 MCG tablet Commonly known as: CYANOCOBALAMIN     TAKE these medications   acetaminophen 325 MG tablet Commonly known as: TYLENOL Take 2 tablets (650 mg total) by mouth every 4 (four) hours as needed for mild pain (or temp > 37.5 C (99.5 F)).   albuterol 108 (90 Base) MCG/ACT inhaler Commonly known as: ProAir HFA Inhale 2 puffs into the lungs every 4 (four) hours as needed for wheezing or shortness of breath.   Biotin 10 MG Caps Take 10 mg by mouth daily.   Breztri Aerosphere 160-9-4.8 MCG/ACT Aero Generic drug: Budeson-Glycopyrrol-Formoterol Inhale 2 Inhalers into the lungs in the morning and at bedtime.   Caltrate 600+D3 600-800 MG-UNIT Tabs Generic drug: Calcium Carb-Cholecalciferol Take 1 tablet by mouth daily.   docusate sodium 100 MG capsule Commonly known as: COLACE Take 1 capsule (100 mg total) by mouth 2 (two) times daily.   Euthyrox 25 MCG tablet Generic drug: levothyroxine Take 1 tablet by mouth once daily   famotidine 20 MG tablet Commonly known as: Pepcid Take 1 tablet (20 mg total) by mouth at bedtime.   folic acid 1 MG tablet Commonly known as: FOLVITE Take 1 tablet (1 mg total) by mouth daily.   furosemide 40 MG tablet Commonly known as: LASIX Take 1 tablet (40 mg total) by mouth daily. Start taking on: August 17, 2020 What changed:   medication strength  how much to take   iron polysaccharides 150 MG capsule Commonly known as: NIFEREX Take  1 capsule (150 mg total) by mouth daily.   levalbuterol 1.25 MG/3ML nebulizer solution Commonly known as: XOPENEX Take 1.25 mg by nebulization every 4 (four) hours as needed for wheezing. Dx J45.909   memantine 10 MG tablet Commonly known as: NAMENDA Take 1 tablet (10 mg total) by mouth 2 (two) times daily.   metoprolol tartrate 25 MG tablet Commonly known as: LOPRESSOR Take 1 tablet (25 mg total) by mouth 2  (two) times daily.   pantoprazole 40 MG tablet Commonly known as: PROTONIX Take 1 tablet by mouth twice daily   potassium chloride 10 MEQ tablet Commonly known as: KLOR-CON Take 2 tablets (20 mEq total) by mouth daily. What changed: when to take this            Discharge Care Instructions  (From admission, onward)         Start     Ordered   08/16/20 0000  No dressing needed        08/16/20 1509         Allergies  Allergen Reactions  . Hctz [Hydrochlorothiazide] Other (See Comments)    Pt was ill and this affected her kidneys   . Aspirin Other (See Comments)    Cardiologist said the patient is to not take this  . Codeine Other (See Comments)    Made the patient feel ill, has not had any problems since 1977    Contact information for follow-up providers    Crystal Fraise, MD. Schedule an appointment as soon as possible for a visit in 2 week(s).   Specialty: Family Medicine Why: after discharge from SNF Contact information: [place] [address] [phone]        Lelon Perla, MD .   Specialty: Cardiology Contact information: [address] [place] [address] [phone]        Phillips Odor, MD. Schedule an appointment as soon as possible for a visit in 6 week(s).   Specialty: Neurology Contact information: [address] [place] [address] [phone]            Contact information for after-discharge care    [facility] .   Service: Skilled Nursing Contact information: [address] [place] [phone]                  The results of significant diagnostics from this hospitalization (including imaging, microbiology, ancillary and laboratory) are listed below for reference.    Significant Diagnostic Studies: DG Chest 2 View  Result Date: 08/05/2020 CLINICAL DATA:  Cough, shortness of breath EXAM: CHEST - 2  VIEW COMPARISON:  03/09/2020 FINDINGS: There is hyperinflation of the lungs compatible with COPD. Cardiomegaly. Prior TAVR. Left pacer remains in place, unchanged. Patchy airspace opacity in the upper lobes concerning for pneumonia. No effusions. No acute bony abnormality. IMPRESSION: Cardiomegaly, COPD. Bilateral upper lobe airspace opacities concerning for pneumonia. Electronically Signed   By: Rolm Baptise M.D.   On: 08/05/2020 00:00   CT Head Wo Contrast  Result Date: 08/13/2020 CLINICAL DATA:  Altered mental status since 9 p.m. last night. EXAM: CT HEAD WITHOUT CONTRAST TECHNIQUE: Contiguous axial images were obtained from the base of the skull through the vertex without intravenous contrast. COMPARISON:  Head CT 03/09/2020. FINDINGS: Brain: No evidence of acute infarction, hemorrhage, hydrocephalus, extra-axial collection or mass lesion/mass effect. Atrophy and chronic microvascular ischemic disease are unchanged in appearance. Vascular: No  hyperdense vessel or unexpected calcification. Skull: Intact.  No focal lesion. Sinuses/Orbits: Status post cataract surgery.  Otherwise negative. Other: None. IMPRESSION: No acute abnormality. Atrophy and chronic microvascular ischemic disease. Electronically Signed   By: Inge Rise M.D.   On: 08/13/2020 12:16   US Carotid Bilateral (at [facility] and [facility] only)  Result Date: 08/14/2020 CLINICAL DATA:  87 year old female with a history of stroke EXAM: BILATERAL CAROTID DUPLEX ULTRASOUND TECHNIQUE: Pearline Cables scale imaging, color Doppler and duplex ultrasound were performed of bilateral carotid and vertebral arteries in the neck. COMPARISON:  None. FINDINGS: Criteria: Quantification of carotid stenosis is based on velocity parameters that correlate the residual internal carotid diameter with NASCET-based stenosis levels, using the diameter of the distal internal carotid lumen as the denominator for stenosis measurement. The following velocity measurements were obtained:  RIGHT ICA:  Systolic 90 cm/sec, Diastolic 19 cm/sec CCA:  161 cm/sec SYSTOLIC ICA/CCA RATIO:  0.8 ECA:  97 cm/sec LEFT ICA:  Systolic 60 cm/sec, Diastolic 14 cm/sec CCA:  68 cm/sec SYSTOLIC ICA/CCA RATIO:  0.9 ECA:  87 cm/sec Right Brachial SBP: Not acquired Left Brachial SBP: Not acquired RIGHT CAROTID ARTERY: No significant calcifications of the right common carotid artery. Intermediate waveform maintained. Heterogeneous and partially calcified plaque at the right carotid bifurcation. No significant lumen shadowing. Low resistance waveform of the right ICA. No significant tortuosity. RIGHT VERTEBRAL ARTERY: Antegrade flow with low resistance waveform. LEFT CAROTID ARTERY: No significant calcifications of the left common carotid artery. Intermediate waveform maintained. Heterogeneous and partially calcified plaque at the left carotid bifurcation without significant lumen shadowing. Low resistance waveform of the left ICA. No significant tortuosity. LEFT VERTEBRAL ARTERY:  Antegrade flow with low resistance waveform. IMPRESSION: Color duplex indicates minimal heterogeneous and calcified plaque, with no hemodynamically significant stenosis by duplex criteria in the extracranial cerebrovascular circulation. Signed, Dulcy Fanny. Dellia Nims, RPVI [facility] Radiology Electronically Signed   By: Corrie Mckusick D.O.   On: 08/14/2020 13:44   CT CEREBRAL PERFUSION W CONTRAST  Result Date: 08/13/2020 CLINICAL DATA:  Speech disturbance. EXAM: CT ANGIOGRAPHY HEAD AND NECK CT PERFUSION BRAIN TECHNIQUE: Multidetector CT imaging of the head and neck was performed using the standard protocol during bolus administration of intravenous contrast. Multiplanar CT image reconstructions and MIPs were obtained to evaluate the vascular anatomy. Carotid stenosis measurements (when applicable) are obtained utilizing NASCET criteria, using the distal internal carotid diameter as the  denominator. Multiphase CT imaging of the brain was performed following IV bolus contrast injection. Subsequent parametric perfusion maps were calculated using RAPID software. CONTRAST:  13mL OMNIPAQUE IOHEXOL 350 MG/ML SOLN COMPARISON:  None. FINDINGS: CTA NECK FINDINGS Aortic arch: Standard 3 vessel aortic arch with mild atherosclerotic plaque and widely patent arch vessel origins. Right carotid system: Patent with mild-to-moderate calcified and soft plaque at the carotid bifurcation not resulting in significant stenosis. Left carotid system: Patent with mild-to-moderate calcified plaque at the carotid bifurcation not resulting in significant stenosis. Vertebral arteries: Patent without evidence of stenosis or dissection. Mildly to moderately dominant left vertebral artery. Skeleton: Advanced cervical disc degeneration with evidence of severe spinal and neural foraminal stenosis at C5-6. Other neck: No evidence of cervical lymphadenopathy or mass. Upper chest: Motion artifact through the lung apices with scattered small ground-glass and nodular densities in the included portions of the upper lobes likely related to the suspected bilateral upper lobe pneumonia on 08/04/2020 chest radiographs. Review of the MIP images confirms the above findings CTA HEAD FINDINGS Anterior  circulation: The internal carotid arteries are patent from skull base to carotid termini with mild atherosclerotic plaque bilaterally not resulting in significant stenosis. There is occlusion of the left M1 segment with good distal collateralization. The right MCA and both ACAs are patent without evidence of a significant proximal stenosis. No aneurysm is identified. Posterior circulation: Intracranial vertebral arteries are widely patent to the basilar. Patent PICA, AICA, and SCA origins are seen bilaterally with the right PICA and right AICA sharing a common trunk. The basilar artery is widely patent. There are right larger than left posterior  communicating arteries with a fetal origin of the right PCA. Both PCAs are patent without evidence of a significant proximal stenosis. No aneurysm is identified. Venous sinuses: Patent. Anatomic variants: Fetal right PCA. Review of the MIP images confirms the above findings CT Brain Perfusion Findings: ASPECTS: 10 CBF (<30%) Volume: 0 mL Perfusion (Tmax>6.0s) volume: 26 mL Mismatch Volume: 26 mL Infarction Location: No core infarct identified by CTP. Ischemic penumbra in the left MCA territory primarily in the left temporoparietal junction region. IMPRESSION: 1. 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 2. 26 mL ischemic penumbra in the left MCA territory without evidence of a core infarct. 3. Mild-to-moderate cervical carotid atherosclerosis without significant stenosis. 4. Aortic Atherosclerosis (ICD10-I70.0). These results were called by telephone at the time of interpretation on 08/13/2020 at 1:12 p.m. to Dr. Donnetta Simpers, who verbally acknowledged these results. Electronically Signed   By: Logan Bores M.D.   On: 08/13/2020 13:31   ECHOCARDIOGRAM COMPLETE  Result Date: 08/14/2020    ECHOCARDIOGRAM REPORT   Patient Name:   BREAUNA MAZZEO [facility] Date of Exam: 08/14/2020 Medical Rec #:  [id number]       Height:       61.0 in Accession #:    [id number]      Weight:       114.8 lb Date of Birth:  12-31-1926        BSA:          1.492 m Patient Age:    61 years        BP:           143/66 mmHg Patient Gender: F               HR:           60 bpm. Exam Location:  [facility] Procedure: 2D Echo, Cardiac Doppler and Color Doppler Indications:    Stroke 434.91 / I163.9  History:        Patient has prior history of Echocardiogram examinations.                 Pacemaker, COPD, Arrythmias:Atrial Fibrillation; Risk                 Factors:Hypertension. Long term (current) use of anticoagulants,                 S/P TAVR (transcatheter aortic valve replacement).  Sonographer:    Alvino Chapel RCS Referring Phys: Alvan  1. Left ventricular ejection fraction, by estimation, is 60 to 65%. The left ventricle has normal function. The left ventricle has no regional wall motion abnormalities. There is mild left ventricular hypertrophy. Left ventricular diastolic parameters are indeterminate.  2. Right ventricular systolic function is normal. The right ventricular size is normal. There is normal pulmonary artery systolic pressure. The estimated right ventricular systolic pressure is 25.9 mmHg.  3. Left atrial size was moderately  dilated.  4. Right atrial size was severely dilated.  5. The mitral valve is abnormal. No evidence of mitral valve regurgitation. Moderate mitral annular calcification.  6. There is a 29 mm Sapien prosthetic, stented (TAVR) valve present in the aortic position. Aortic valve regurgitation is not visualized. Echo findings are consistent with normal structure and function of the aortic valve prosthesis. Aortic valve mean gradient measures 6.0 mmHg.  7. The inferior vena cava is normal in size with greater than 50% respiratory variability, suggesting right atrial pressure of 3 mmHg. FINDINGS  Left Ventricle: Left ventricular ejection fraction, by estimation, is 60 to 65%. The left ventricle has normal function. The left ventricle has no regional wall motion abnormalities. The left ventricular internal cavity size was normal in size. There is  mild left ventricular hypertrophy. Left ventricular diastolic parameters are indeterminate. Right Ventricle: The right ventricular size is normal. No increase in right ventricular wall thickness. Right ventricular systolic function is normal. There is normal pulmonary artery systolic pressure. The tricuspid regurgitant velocity is 2.70 m/s, and  with an assumed right atrial pressure of 3 mmHg, the estimated right ventricular systolic pressure is 73.7 mmHg. Left Atrium: Left atrial size was moderately dilated. Right Atrium: Right atrial size was severely  dilated. Pericardium: There is no evidence of pericardial effusion. Mitral Valve: The mitral valve is abnormal. Moderate mitral annular calcification. No evidence of mitral valve regurgitation. Tricuspid Valve: The tricuspid valve is normal in structure. Tricuspid valve regurgitation is trivial. Aortic Valve: The aortic valve has been repaired/replaced. Aortic valve regurgitation is not visualized. Aortic valve mean gradient measures 6.0 mmHg. There is a 29 mm Sapien prosthetic, stented (TAVR) valve present in the aortic position. Echo findings are consistent with normal structure and function of the aortic valve prosthesis. Pulmonic Valve: The pulmonic valve was not well visualized. Pulmonic valve regurgitation is not visualized. Aorta: The aortic root is normal in size and structure. Venous: The inferior vena cava is normal in size with greater than 50% respiratory variability, suggesting right atrial pressure of 3 mmHg. IAS/Shunts: The interatrial septum was not well visualized.  LEFT VENTRICLE PLAX 2D LVIDd:         3.70 cm  Diastology LVIDs:         2.50 cm  LV e' medial:    6.53 cm/s LV PW:         1.10 cm  LV E/e' medial:  18.4 LV IVS:        1.20 cm  LV e' lateral:   6.74 cm/s LVOT diam:     1.50 cm  LV E/e' lateral: 17.8 LV SV:         29 LV SV Index:   20 LVOT Area:     1.77 cm  RIGHT VENTRICLE RV S prime:     10.40 cm/s TAPSE (M-mode): 1.5 cm LEFT ATRIUM             Index       RIGHT ATRIUM           Index LA diam:        4.00 cm 2.68 cm/m  RA Area:     27.20 cm LA Vol (A2C):   72.5 ml 48.60 ml/m RA Volume:   96.00 ml  64.35 ml/m LA Vol (A4C):   53.3 ml 35.73 ml/m LA Biplane Vol: 68.1 ml 45.65 ml/m  AORTIC VALVE AV Mean Grad: 6.0 mmHg LVOT Vmax:    79.20 cm/s LVOT Vmean:   48.200  cm/s LVOT VTI:     0.165 m  AORTA Ao Root diam: 2.70 cm MITRAL VALVE                TRICUSPID VALVE MV Area (PHT): 2.39 cm     TR Peak grad:   29.2 mmHg MV Decel Time: 318 msec     TR Vmax:        270.00 cm/s MV E  velocity: 120.00 cm/s MV A velocity: 29.50 cm/s   SHUNTS MV E/A ratio:  4.07         Systemic VTI:  0.16 m                             Systemic Diam: 1.50 cm Oswaldo Milian MD Electronically signed by Oswaldo Milian MD Signature Date/Time: 08/14/2020/1:19:31 PM    Final    CT ANGIO HEAD CODE STROKE  Result Date: 08/13/2020 CLINICAL DATA:  Speech disturbance. EXAM: CT ANGIOGRAPHY HEAD AND NECK CT PERFUSION BRAIN TECHNIQUE: Multidetector CT imaging of the head and neck was performed using the standard protocol during bolus administration of intravenous contrast. Multiplanar CT image reconstructions and MIPs were obtained to evaluate the vascular anatomy. Carotid stenosis measurements (when applicable) are obtained utilizing NASCET criteria, using the distal internal carotid diameter as the denominator. Multiphase CT imaging of the brain was performed following IV bolus contrast injection. Subsequent parametric perfusion maps were calculated using RAPID software. CONTRAST:  155mL OMNIPAQUE IOHEXOL 350 MG/ML SOLN COMPARISON:  None. FINDINGS: CTA NECK FINDINGS Aortic arch: Standard 3 vessel aortic arch with mild atherosclerotic plaque and widely patent arch vessel origins. Right carotid system: Patent with mild-to-moderate calcified and soft plaque at the carotid bifurcation not resulting in significant stenosis. Left carotid system: Patent with mild-to-moderate calcified plaque at the carotid bifurcation not resulting in significant stenosis. Vertebral arteries: Patent without evidence of stenosis or dissection. Mildly to moderately dominant left vertebral artery. Skeleton: Advanced cervical disc degeneration with evidence of severe spinal and neural foraminal stenosis at C5-6. Other neck: No evidence of cervical lymphadenopathy or mass. Upper chest: Motion artifact through the lung apices with scattered small ground-glass and nodular densities in the included portions of the upper lobes likely related to  the suspected bilateral upper lobe pneumonia on 08/04/2020 chest radiographs. Review of the MIP images confirms the above findings CTA HEAD FINDINGS Anterior circulation: The internal carotid arteries are patent from skull base to carotid termini with mild atherosclerotic plaque bilaterally not resulting in significant stenosis. There is occlusion of the left M1 segment with good distal collateralization. The right MCA and both ACAs are patent without evidence of a significant proximal stenosis. No aneurysm is identified. Posterior circulation: Intracranial vertebral arteries are widely patent to the basilar. Patent PICA, AICA, and SCA origins are seen bilaterally with the right PICA and right AICA sharing a common trunk. The basilar artery is widely patent. There are right larger than left posterior communicating arteries with a fetal origin of the right PCA. Both PCAs are patent without evidence of a significant proximal stenosis. No aneurysm is identified. Venous sinuses: Patent. Anatomic variants: Fetal right PCA. Review of the MIP images confirms the above findings CT Brain Perfusion Findings: ASPECTS: 10 CBF (<30%) Volume: 0 mL Perfusion (Tmax>6.0s) volume: 26 mL Mismatch Volume: 26 mL Infarction Location: No core infarct identified by CTP. Ischemic penumbra in the left MCA territory primarily in the left temporoparietal junction region. IMPRESSION: 1. 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 2. 26 mL ischemic penumbra in the left MCA territory without evidence of a core infarct. 3. Mild-to-moderate cervical carotid atherosclerosis without significant stenosis. 4. Aortic Atherosclerosis (ICD10-I70.0). These results were called by telephone at the time of interpretation on 08/13/2020 at 1:12 p.m. to Dr. Donnetta Simpers, who verbally acknowledged these results. Electronically Signed   By: Logan Bores M.D.   On: 08/13/2020 13:31   CT ANGIO NECK CODE STROKE  Result Date: 08/13/2020 CLINICAL  DATA:  Speech disturbance. EXAM: CT ANGIOGRAPHY HEAD AND NECK CT PERFUSION BRAIN TECHNIQUE: Multidetector CT imaging of the head and neck was performed using the standard protocol during bolus administration of intravenous contrast. Multiplanar CT image reconstructions and MIPs were obtained to evaluate the vascular anatomy. Carotid stenosis measurements (when applicable) are obtained utilizing NASCET criteria, using the distal internal carotid diameter as the denominator. Multiphase CT imaging of the brain was performed following IV bolus contrast injection. Subsequent parametric perfusion maps were calculated using RAPID software. CONTRAST:  166mL OMNIPAQUE IOHEXOL 350 MG/ML SOLN COMPARISON:  None. FINDINGS: CTA NECK FINDINGS Aortic arch: Standard 3 vessel aortic arch with mild atherosclerotic plaque and widely patent arch vessel origins. Right carotid system: Patent with mild-to-moderate calcified and soft plaque at the carotid bifurcation not resulting in significant stenosis. Left carotid system: Patent with mild-to-moderate calcified plaque at the carotid bifurcation not resulting in significant stenosis. Vertebral arteries: Patent without evidence of stenosis or dissection. Mildly to moderately dominant left vertebral artery. Skeleton: Advanced cervical disc degeneration with evidence of severe spinal and neural foraminal stenosis at C5-6. Other neck: No evidence of cervical lymphadenopathy or mass. Upper chest: Motion artifact through the lung apices with scattered small ground-glass and nodular densities in the included portions of the upper lobes likely related to the suspected bilateral upper lobe pneumonia on 08/04/2020 chest radiographs. Review of the MIP images confirms the above findings CTA HEAD FINDINGS Anterior circulation: The internal carotid arteries are patent from skull base to carotid termini with mild atherosclerotic plaque bilaterally not resulting in significant stenosis. There is  occlusion of the left M1 segment with good distal collateralization. The right MCA and both ACAs are patent without evidence of a significant proximal stenosis. No aneurysm is identified. Posterior circulation: Intracranial vertebral arteries are widely patent to the basilar. Patent PICA, AICA, and SCA origins are seen bilaterally with the right PICA and right AICA sharing a common trunk. The basilar artery is widely patent. There are right larger than left posterior communicating arteries with a fetal origin of the right PCA. Both PCAs are patent without evidence of a significant proximal stenosis. No aneurysm is identified. Venous sinuses: Patent. Anatomic variants: Fetal right PCA. Review of the MIP images confirms the above findings CT Brain Perfusion Findings: ASPECTS: 10 CBF (<30%) Volume: 0 mL Perfusion (Tmax>6.0s) volume: 26 mL Mismatch Volume: 26 mL Infarction Location: No core infarct identified by CTP. Ischemic penumbra in the left MCA territory primarily in the left temporoparietal junction region. IMPRESSION: 1. 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 2. 26 mL ischemic penumbra in the left MCA territory without evidence of a core infarct. 3. Mild-to-moderate cervical carotid atherosclerosis without significant stenosis. 4. Aortic Atherosclerosis (ICD10-I70.0). These results were called by telephone at the time of interpretation on 08/13/2020 at 1:12 p.m. to Dr. Donnetta Simpers, who verbally acknowledged these results. Electronically Signed   By: Logan Bores M.D.   On: 08/13/2020 13:31   Microbiology: Recent Results (from the past 240 hour(s))  Resp  Panel by RT-PCR (Flu A&B, Covid) Nasopharyngeal Swab     Status: None   Collection Time: 08/13/20  2:25 PM   Specimen: Nasopharyngeal Swab; Nasopharyngeal(NP) swabs in vial transport medium  Result Value Ref Range Status   SARS Coronavirus 2 by RT PCR NEGATIVE NEGATIVE Final    Comment: (NOTE) SARS-CoV-2 target nucleic acids are NOT  DETECTED.  The SARS-CoV-2 RNA is generally detectable in upper respiratory specimens during the acute phase of infection. The lowest concentration of SARS-CoV-2 viral copies this assay can detect is 138 copies/mL. A negative result does not preclude SARS-Cov-2 infection and should not be used as the sole basis for treatment or other patient management decisions. A negative result may occur with  improper specimen collection/handling, submission of specimen other than nasopharyngeal swab, presence of viral mutation(s) within the areas targeted by this assay, and inadequate number of viral copies(<138 copies/mL). A negative result must be combined with clinical observations, patient history, and epidemiological information. The expected result is Negative.  Fact Sheet for Patients:  EntrepreneurPulse.com.au  Fact Sheet for Healthcare Providers:  IncredibleEmployment.be  This test is no t yet approved or cleared by the [place] FDA and  has been authorized for detection and/or diagnosis of SARS-CoV-2 by FDA under an Emergency Use Authorization (EUA). This EUA will remain  in effect (meaning this test can be used) for the duration of the COVID-19 declaration under Section 564(b)(1) of the Act, 21 U.S.C.section 360bbb-3(b)(1), unless the authorization is terminated  or revoked sooner.       Influenza A by PCR NEGATIVE NEGATIVE Final   Influenza B by PCR NEGATIVE NEGATIVE Final    Comment: (NOTE) The Xpert Xpress SARS-CoV-2/FLU/RSV plus assay is intended as an aid in the diagnosis of influenza from Nasopharyngeal swab specimens and should not be used as a sole basis for treatment. Nasal washings and aspirates are unacceptable for Xpert Xpress SARS-CoV-2/FLU/RSV testing.  Fact Sheet for Patients: EntrepreneurPulse.com.au  Fact Sheet for Healthcare Providers: IncredibleEmployment.be  This test is not yet  approved or cleared by the [place] FDA and has been authorized for detection and/or diagnosis of SARS-CoV-2 by FDA under an Emergency Use Authorization (EUA). This EUA will remain in effect (meaning this test can be used) for the duration of the COVID-19 declaration under Section 564(b)(1) of the Act, 21 U.S.C. section 360bbb-3(b)(1), unless the authorization is terminated or revoked.  Performed at [facility], [address], [place], [place] [address]   Resp Panel by RT-PCR (Flu A&B, Covid) Nasopharyngeal Swab     Status: None   Collection Time: 08/16/20  9:23 AM   Specimen: Nasopharyngeal Swab; Nasopharyngeal(NP) swabs in vial transport medium  Result Value Ref Range Status   SARS Coronavirus 2 by RT PCR NEGATIVE NEGATIVE Final    Comment: (NOTE) SARS-CoV-2 target nucleic acids are NOT DETECTED.  The SARS-CoV-2 RNA is generally detectable in upper respiratory specimens during the acute phase of infection. The lowest concentration of SARS-CoV-2 viral copies this assay can detect is 138 copies/mL. A negative result does not preclude SARS-Cov-2 infection and should not be used as the sole basis for treatment or other patient management decisions. A negative result may occur with  improper specimen collection/handling, submission of specimen other than nasopharyngeal swab, presence of viral mutation(s) within the areas targeted by this assay, and inadequate number of viral copies(<138 copies/mL). A negative result must be combined with clinical observations, patient history, and epidemiological information. The expected result is Negative.  Fact Sheet for  Patients:  EntrepreneurPulse.com.au  Fact Sheet for Healthcare Providers:  IncredibleEmployment.be  This test is no t yet approved or cleared by the [place] FDA and  has been authorized for detection and/or diagnosis of SARS-CoV-2 by FDA under an Emergency Use Authorization (EUA).  This EUA will remain  in effect (meaning this test can be used) for the duration of the COVID-19 declaration under Section 564(b)(1) of the Act, 21 U.S.C.section 360bbb-3(b)(1), unless the authorization is terminated  or revoked sooner.       Influenza A by PCR NEGATIVE NEGATIVE Final   Influenza B by PCR NEGATIVE NEGATIVE Final    Comment: (NOTE) The Xpert Xpress SARS-CoV-2/FLU/RSV plus assay is intended as an aid in the diagnosis of influenza from Nasopharyngeal swab specimens and should not be used as a sole basis for treatment. Nasal washings and aspirates are unacceptable for Xpert Xpress SARS-CoV-2/FLU/RSV testing.  Fact Sheet for Patients: EntrepreneurPulse.com.au  Fact Sheet for Healthcare Providers: IncredibleEmployment.be  This test is not yet approved or cleared by the [place] FDA and has been authorized for detection and/or diagnosis of SARS-CoV-2 by FDA under an Emergency Use Authorization (EUA). This EUA will remain in effect (meaning this test can be used) for the duration of the COVID-19 declaration under Section 564(b)(1) of the Act, 21 U.S.C. section 360bbb-3(b)(1), unless the authorization is terminated or revoked.  Performed at [facility], [address] [place], [place] [address]      Labs: Basic Metabolic Panel: Recent Labs  Lab 08/13/20 1215 08/13/20 1222  NA 135 136  K 3.9 3.8  CL 98 97*  CO2 29  --   GLUCOSE 122* 116*  BUN 24* 25*  CREATININE 1.28* 1.30*  CALCIUM 8.7*  --    Liver Function Tests: Recent Labs  Lab 08/13/20 1215  AST 26  ALT 15  ALKPHOS 92  BILITOT 0.7  PROT 6.3*  ALBUMIN 2.9*   CBC: Recent Labs  Lab 08/13/20 1215 08/13/20 1222  WBC 7.5  --   NEUTROABS 5.9  --   HGB 9.4* 10.5*  HCT 31.8* 31.0*  MCV 95.8  --   PLT 270  --     BNP (last 3 results) Recent Labs    08/26/19 1135 12/13/19 0625 02/12/20 0752  BNP 483.0* 468.0* 313.0*    CBG: Recent Labs  Lab  08/13/20 1201 08/15/20 0727  GLUCAP 114* 102*    Signed:  Barton Dubois MD.  [facility] 08/16/2020, 3:12 PM

## 2020-08-16 NOTE — Progress Notes (Signed)
PT Cancellation Note  Patient Details Name: Crystal Cooper MRN: [id number] DOB: 11-29-1926   Cancelled Treatment:    Reason Eval/Treat Not Completed: Other (comment) (discharging today).  Per pt's daughter in room, pt discharging to SNF today and daughter would prefer to allow pt to rest. Will check on pt tomorrow as schedule permits if d/c does not occur today.    Talbot Grumbling PT, DPT 08/16/20, 11:32 AM

## 2020-08-16 NOTE — Progress Notes (Signed)
Report called to [facility], awaiting patient transfer

## 2020-08-16 NOTE — TOC Transition Note (Signed)
Transition of Care [facility]) - CM/SW Discharge Note   Patient Details  Name: Crystal Cooper MRN: [id number] Date of Birth: Sep 14, 1926  [facility] of [facility]) CM/SW Contact:  Boneta Lucks, RN Phone Number: 08/16/2020, 10:29 AM   Clinical Narrative:   Patient medically ready to go to [facility]. TOC updated daughter, Quita Skye with DC plan and [facility] visiting policy.  COVID test ordered. RN to call report.    Final next level of care: Skilled Nursing Facility Barriers to Discharge: Barriers Resolved   Patient Goals and CMS Choice Patient states their goals for this hospitalization and ongoing recovery are:: to go to SNF. CMS Medicare.gov Compare Post Acute Care list provided to:: Patient Represenative (must comment) Choice offered to / list presented to : Adult Children  Discharge Placement              Patient chooses bed at: [facility] Patient to be transferred to facility by: [facility] staff Name of family member notified: Quita Skye - daughter Patient and family notified of of transfer: 08/16/20  Discharge Plan and Services     Post Acute Care Choice: Nursing Home            Readmission Risk Interventions Readmission Risk Prevention Plan 08/15/2020 10/11/2019  Transportation Screening Complete Complete  HRI or Home Care Consult - Complete  Palliative Care Screening - Not Applicable  Medication Review (RN Care Manager) Complete Complete  HRI or Home Care Consult Complete -  SW Recovery Care/Counseling Consult Complete -  Palliative Care Screening Not Applicable -  Skilled Nursing Facility Complete -  Some recent data might be hidden

## 2020-08-16 NOTE — Plan of Care (Signed)
  Problem: Education: Goal: Knowledge of General Education information will improve Description: Including pain rating scale, medication(s)/side effects and non-pharmacologic comfort measures Outcome: Adequate for Discharge   Problem: Health Behavior/Discharge Planning: Goal: Ability to manage health-related needs will improve Outcome: Adequate for Discharge   Problem: Clinical Measurements: Goal: Ability to maintain clinical measurements within normal limits will improve Outcome: Adequate for Discharge Goal: Will remain free from infection Outcome: Adequate for Discharge Goal: Diagnostic test results will improve Outcome: Adequate for Discharge Goal: Respiratory complications will improve Outcome: Adequate for Discharge Goal: Cardiovascular complication will be avoided Outcome: Adequate for Discharge   Problem: Activity: Goal: Risk for activity intolerance will decrease Outcome: Adequate for Discharge   Problem: Nutrition: Goal: Adequate nutrition will be maintained Outcome: Adequate for Discharge   Problem: Coping: Goal: Level of anxiety will decrease Outcome: Adequate for Discharge   Problem: Elimination: Goal: Will not experience complications related to bowel motility Outcome: Adequate for Discharge Goal: Will not experience complications related to urinary retention Outcome: Adequate for Discharge   Problem: Pain Managment: Goal: General experience of comfort will improve Outcome: Adequate for Discharge   Problem: Safety: Goal: Ability to remain free from injury will improve Outcome: Adequate for Discharge   Problem: Skin Integrity: Goal: Risk for impaired skin integrity will decrease Outcome: Adequate for Discharge   Problem: Education: Goal: Knowledge of disease or condition will improve Outcome: Adequate for Discharge Goal: Knowledge of secondary prevention will improve Outcome: Adequate for Discharge Goal: Knowledge of patient specific risk factors  addressed and post discharge goals established will improve Outcome: Adequate for Discharge Goal: Individualized Educational Video(s) Outcome: Adequate for Discharge   Problem: Health Behavior/Discharge Planning: Goal: Ability to manage health-related needs will improve Outcome: Adequate for Discharge   Problem: Self-Care: Goal: Ability to participate in self-care as condition permits will improve Outcome: Adequate for Discharge Goal: Verbalization of feelings and concerns over difficulty with self-care will improve Outcome: Adequate for Discharge Goal: Ability to communicate needs accurately will improve Outcome: Adequate for Discharge   Problem: Nutrition: Goal: Dietary intake will improve Outcome: Adequate for Discharge

## 2020-08-16 NOTE — Progress Notes (Signed)
Patient offered breakfast but refused to eat.

## 2020-08-16 NOTE — Progress Notes (Signed)
SLP Cancellation Note  Patient Details Name: Crystal Cooper MRN: [id number] DOB: [birth year]   Cancelled treatment:       Reason Eval/Treat Not Completed: Other (comment). SLP spoke with Pt and Pt's daughter who was present at bedside; upon admission Pt was reportedly presenting with slurred and garbled speech which was an acute change, this has however, resolved. At baseline, Pt has dementia; her baseline deficits include short term memory impairment, forgetting significant people in her life have passed away, getting mixed up on where she is, etc. Pt is very hard of hearing. Pt will benefit from a formal cognitive assessment in the SNF setting in order to provide education to family and to implement strategies for cognitive maintenance and facilitating maintaining independence, however, since there is no acute change in her cognition and/or speech SLE is not indicated in acute care. ST will sign off at this time, thank you for this referral.  Porter Moes H. Roddie Mc, CCC-SLP Speech Language Pathologist   Wende Bushy 08/16/2020, 11:42 AM

## 2020-08-17 ENCOUNTER — Emergency Department (HOSPITAL_COMMUNITY): Payer: Medicare Other

## 2020-08-17 ENCOUNTER — Inpatient Hospital Stay
Admission: RE | Admit: 2020-08-17 | Discharge: 2021-01-21 | Disposition: A | Discharge status: Other Acute Inpatient Hospital | Payer: Medicare Other | Source: Ambulatory Visit | Attending: Internal Medicine | Admitting: Internal Medicine

## 2020-08-17 ENCOUNTER — Encounter (HOSPITAL_COMMUNITY): Payer: Self-pay | Admitting: Emergency Medicine

## 2020-08-17 ENCOUNTER — Observation Stay (HOSPITAL_COMMUNITY)
Admission: EM | Admit: 2020-08-17 | Discharge: 2020-08-17 | Disposition: A | Discharge status: Home / Self Care | Payer: Medicare Other | Attending: Family Medicine | Admitting: Family Medicine

## 2020-08-17 DIAGNOSIS — N1832 Chronic kidney disease, stage 3b: Secondary | ICD-10-CM | POA: Diagnosis not present

## 2020-08-17 DIAGNOSIS — J449 Chronic obstructive pulmonary disease, unspecified: Secondary | ICD-10-CM | POA: Diagnosis not present

## 2020-08-17 DIAGNOSIS — F0390 Unspecified dementia without behavioral disturbance: Secondary | ICD-10-CM | POA: Diagnosis present

## 2020-08-17 DIAGNOSIS — J45909 Unspecified asthma, uncomplicated: Secondary | ICD-10-CM | POA: Diagnosis not present

## 2020-08-17 DIAGNOSIS — J189 Pneumonia, unspecified organism: Secondary | ICD-10-CM | POA: Diagnosis not present

## 2020-08-17 DIAGNOSIS — K922 Gastrointestinal hemorrhage, unspecified: Principal | ICD-10-CM

## 2020-08-17 DIAGNOSIS — K921 Melena: Secondary | ICD-10-CM | POA: Diagnosis not present

## 2020-08-17 DIAGNOSIS — Z79899 Other long term (current) drug therapy: Secondary | ICD-10-CM | POA: Insufficient documentation

## 2020-08-17 DIAGNOSIS — K31819 Angiodysplasia of stomach and duodenum without bleeding: Secondary | ICD-10-CM

## 2020-08-17 DIAGNOSIS — Z950 Presence of cardiac pacemaker: Secondary | ICD-10-CM | POA: Diagnosis not present

## 2020-08-17 DIAGNOSIS — I5032 Chronic diastolic (congestive) heart failure: Secondary | ICD-10-CM | POA: Insufficient documentation

## 2020-08-17 DIAGNOSIS — I639 Cerebral infarction, unspecified: Secondary | ICD-10-CM

## 2020-08-17 DIAGNOSIS — I130 Hypertensive heart and chronic kidney disease with heart failure and stage 1 through stage 4 chronic kidney disease, or unspecified chronic kidney disease: Secondary | ICD-10-CM | POA: Diagnosis not present

## 2020-08-17 DIAGNOSIS — N183 Chronic kidney disease, stage 3 unspecified: Secondary | ICD-10-CM | POA: Diagnosis present

## 2020-08-17 DIAGNOSIS — K6389 Other specified diseases of intestine: Secondary | ICD-10-CM | POA: Diagnosis present

## 2020-08-17 DIAGNOSIS — Z20822 Contact with and (suspected) exposure to covid-19: Secondary | ICD-10-CM | POA: Diagnosis not present

## 2020-08-17 DIAGNOSIS — K2970 Gastritis, unspecified, without bleeding: Secondary | ICD-10-CM | POA: Diagnosis present

## 2020-08-17 DIAGNOSIS — R195 Other fecal abnormalities: Secondary | ICD-10-CM | POA: Diagnosis present

## 2020-08-17 DIAGNOSIS — R140 Abdominal distension (gaseous): Secondary | ICD-10-CM | POA: Diagnosis not present

## 2020-08-17 DIAGNOSIS — D62 Acute posthemorrhagic anemia: Secondary | ICD-10-CM | POA: Diagnosis present

## 2020-08-17 DIAGNOSIS — I480 Paroxysmal atrial fibrillation: Secondary | ICD-10-CM | POA: Diagnosis present

## 2020-08-17 DIAGNOSIS — I517 Cardiomegaly: Secondary | ICD-10-CM | POA: Diagnosis not present

## 2020-08-17 LAB — CBC WITH DIFFERENTIAL/PLATELET
Abs Immature Granulocytes: 0.03 10*3/uL (ref 0.00–0.07)
Basophils Absolute: 0.1 10*3/uL (ref 0.0–0.1)
Basophils Relative: 1 %
Eosinophils Absolute: 0.1 10*3/uL (ref 0.0–0.5)
Eosinophils Relative: 2 %
HCT: 30.4 % — ABNORMAL LOW (ref 36.0–46.0)
Hemoglobin: 9.4 g/dL — ABNORMAL LOW (ref 12.0–15.0)
Immature Granulocytes: 0 %
Lymphocytes Relative: 7 %
Lymphs Abs: 0.5 10*3/uL — ABNORMAL LOW (ref 0.7–4.0)
MCH: 29.2 pg (ref 26.0–34.0)
MCHC: 30.9 g/dL (ref 30.0–36.0)
MCV: 94.4 fL (ref 80.0–100.0)
Monocytes Absolute: 0.7 10*3/uL (ref 0.1–1.0)
Monocytes Relative: 9 %
Neutro Abs: 6.2 10*3/uL (ref 1.7–7.7)
Neutrophils Relative %: 81 %
Platelets: 242 10*3/uL (ref 150–400)
RBC: 3.22 MIL/uL — ABNORMAL LOW (ref 3.87–5.11)
RDW: 19.9 % — ABNORMAL HIGH (ref 11.5–15.5)
WBC: 7.7 10*3/uL (ref 4.0–10.5)
nRBC: 0 % (ref 0.0–0.2)

## 2020-08-17 LAB — PROTIME-INR
INR: 1.2 (ref 0.8–1.2)
Prothrombin Time: 14.8 seconds (ref 11.4–15.2)

## 2020-08-17 LAB — COMPREHENSIVE METABOLIC PANEL
ALT: 16 U/L (ref 0–44)
AST: 24 U/L (ref 15–41)
Albumin: 2.7 g/dL — ABNORMAL LOW (ref 3.5–5.0)
Alkaline Phosphatase: 95 U/L (ref 38–126)
Anion gap: 9 (ref 5–15)
BUN: 17 mg/dL (ref 8–23)
CO2: 28 mmol/L (ref 22–32)
Calcium: 8.9 mg/dL (ref 8.9–10.3)
Chloride: 99 mmol/L (ref 98–111)
Creatinine, Ser: 1.16 mg/dL — ABNORMAL HIGH (ref 0.44–1.00)
GFR, Estimated: 45 mL/min — ABNORMAL LOW (ref >60)
Glucose, Bld: 111 mg/dL — ABNORMAL HIGH (ref 70–99)
Potassium: 3.7 mmol/L (ref 3.5–5.1)
Sodium: 136 mmol/L (ref 135–145)
Total Bilirubin: 1.2 mg/dL (ref 0.3–1.2)
Total Protein: 6 g/dL — ABNORMAL LOW (ref 6.5–8.1)

## 2020-08-17 LAB — HEMOGLOBIN AND HEMATOCRIT, BLOOD
HCT: 29.3 % — ABNORMAL LOW (ref 36.0–46.0)
Hemoglobin: 9.1 g/dL — ABNORMAL LOW (ref 12.0–15.0)

## 2020-08-17 LAB — POC OCCULT BLOOD, ED: Fecal Occult Bld: POSITIVE — AB

## 2020-08-17 LAB — SARS CORONAVIRUS 2 (TAT 6-24 HRS): SARS Coronavirus 2: NEGATIVE

## 2020-08-17 MED ORDER — GUAIFENESIN ER 600 MG PO TB12: 600.0000 mg | ORAL_TABLET | Freq: Two times a day (BID) | ORAL | 0 refills | Status: AC

## 2020-08-17 MED ORDER — IOHEXOL 300 MG/ML  SOLN
75.0000 mL | Freq: Once | INTRAMUSCULAR | Status: AC | PRN
  Administered 2020-08-17: 75 mL via INTRAVENOUS

## 2020-08-17 MED ORDER — CEFDINIR 300 MG PO CAPS: 300.0000 mg | ORAL_CAPSULE | Freq: Every day | ORAL | 0 refills | Status: AC

## 2020-08-17 MED ORDER — VANCOMYCIN HCL 750 MG/150ML IV SOLN: 750.0000 mg | INTRAVENOUS | Status: DC

## 2020-08-17 MED ORDER — DOXYCYCLINE HYCLATE 100 MG PO TABS: 100.0000 mg | ORAL_TABLET | Freq: Two times a day (BID) | ORAL | 0 refills | Status: AC

## 2020-08-17 MED ORDER — SODIUM CHLORIDE 0.9 % IV SOLN
2.0000 g | Freq: Once | INTRAVENOUS | Status: AC
  Administered 2020-08-17: 2 g via INTRAVENOUS
  Filled 2020-08-17: qty 2

## 2020-08-17 MED ORDER — SODIUM CHLORIDE 0.9 % IV SOLN: 1.0000 g | Freq: Once | INTRAVENOUS | Status: DC

## 2020-08-17 MED ORDER — VANCOMYCIN HCL 1250 MG/250ML IV SOLN
1250.0000 mg | Freq: Once | INTRAVENOUS | Status: AC
  Administered 2020-08-17: 1250 mg via INTRAVENOUS
  Filled 2020-08-17: qty 250

## 2020-08-17 NOTE — Consult Note (Signed)
Patient Demographics:    Crystal Cooper, is a 87 y.o. female  MRN: [id number]   DOB - [birth year]  Admit Date - 08/17/2020  Outpatient Primary MD for the patient is [name], MD   Assessment & Plan:    Principal Problem:   Acute GI bleeding Active Problems:   Ascending Colonic mass--- presumed to be colon cancer   PAF (paroxysmal atrial fibrillation) (HCC)   Duodenal arteriovenous malformation   Gastritis and gastroduodenitis   Dementia without behavioral disturbance (HCC)   Acute blood loss anemia   CKD (chronic kidney disease) stage 3, GFR 30-59 ml/min (HCC)    1)Ascending Colonic mass--- presumed to be colon cancer--- conference with Dr. Laural Golden gastroenterologist , as well as patient's daughter/POA -Family request outpatient follow-up r with Dr. Jenetta Downer on Monday, 08/22/2020 to discuss possible work-up of this colonic mass and possible transition to palliative care  2) possible pneumonia--- patient received Vanco and cefepime in the ED with discharge on Omnicef and azithromycin -No fevers, no leukocytosis, no hypoxia  3) acute on chronic GI bleed--- multifactorial etiology including right-sided colonic mass presumed to be colon cancer,, AVMs, gastritis and gastroduodenitis --Outpatient follow-up with Dr. Jenetta Downer as above #1 -Repeat CBC every Monday and Friday and transfuse as clinically indicated  4) presumed acute stroke/Acute left ischemic MCA--- please see full discharge summary dictated by Dr. Dyann Kief on 08/16/2020 -Return to [facility] for ongoing rehab therapy -Not a candidate for antiplatelet agent due to ongoing GI bleed with recurrent transfusions  5) paroxysmal atrial fibrillation--- not a candidate for anticoagulation due to ongoing GI bleed with recurrent transfusions  6)  social/ethics--plan of care discussed with patient's 2 daughters, they are we await further conversation with Dr. Jenetta Downer on 08/22/2020 to decide if they should transition to palliative care mode  7) dementia--- patient with advanced dementia with significant cognitive and memory deficits----appears stable at this time  8) CKD 3B--- continue to avoid nephrotoxic agents  9) chronic anemia--due to ongoing GI blood loss, Hgb currently above 9 repeat CBC every Monday and Friday and transfuse as clinically indicated  Discharge Instructions:- 1) please take Omnicef and doxycycline as prescribed for presumed pneumonia 2) please keep your appointment with gastroenterologist Dr. Jenetta Downer for Monday, 08/22/2020 at 10 AM--- to discuss finding of possible right-sided colon cancer within the ascending colon 3) please consider palliative care referral given multiple comorbid conditions including new finding of possible right sided/ascending colon ---colon cancer 4) please repeat CBC every Monday and Fridays starting on 08/19/2020, please consider transfusion if hemoglobin is below 7 or if patient is symptomatic 5) please return to [facility]  SNF for ongoing rehab due to concerns about clinical diagnosis of recent stroke with weakness and deconditioning  Disposition/---- please see discharge instructions above  - -Thank you Dr. Rogene Houston for this ED consult  With History of - Reviewed by me  Past Medical History:  Diagnosis Date  . Acute CVA (cerebrovascular accident) (  [place]) 08/27/2019  . Acute GI bleeding 06/22/2020  . Acute renal failure superimposed on stage 3b chronic kidney disease ([place])   . AKI (acute kidney injury) ([place])   . Anemia    years ago  . Angio-edema, initial encounter 08/23/2019  . Aortic stenosis   . Arthritis   . Asthma   . Atrial fibrillation ([place])   . Blood transfusion without reported diagnosis   . CHF (congestive heart failure) ([place]) 11/2014  . CKD (chronic kidney disease)     had many uti this year   . Closed left hip fracture, initial encounter ([place]) 03/09/2020  . Dementia ([place])   . Family history of adverse reaction to anesthesia    2 daughters would have N/V  . Gastric AVM   . GERD (gastroesophageal reflux disease)    was seen by Gi for bleeding ulcer which was cauterized   . GI bleed   . Glaucoma   . Hearing loss   . Heart murmur    Rheumatic fever whe she was young,  sees Dr. Stanford Breed in [place]  . HTN (hypertension)   . Hypokalemia   . Myocardial infarction ([place])    in her 44 when she had two heart attacks   . PNA (pneumonia) 08/25/2019  . Pneumonia    stayed from friday to sunday in august  . Presence of permanent cardiac pacemaker    placed about 4 years ago  . Prolapsing mitral leaflet syndrome   . Shortness of breath dyspnea    with exertion  . Stroke ([place])   . SVT (supraventricular tachycardia) (HCC)    S/P ablation of AVNRT in 2003      Past Surgical History:  Procedure Laterality Date  . APPENDECTOMY    . BIOPSY  04/07/2018   Procedure: BIOPSY;  Surgeon: Jerene Bears, MD;  Location: [facility] ENDOSCOPY;  Service: Gastroenterology;;  . BIOPSY  02/19/2020   Procedure: BIOPSY;  Surgeon: Harvel Quale, MD;  Location: [facility] ENDO SUITE;  Service: Gastroenterology;;  gastric   . BLADDER SURGERY    . CARDIAC CATHETERIZATION N/A 09/26/2015   Procedure: Right/Left Heart Cath and Coronary Angiography;  Surgeon: Burnell Blanks, MD;  Location: [place] CV LAB;  Service: Cardiovascular;  Laterality: N/A;  . CARDIAC SURGERY    . CARDIOVERSION N/A 03/02/2016   Procedure: CARDIOVERSION;  Surgeon: Thayer Headings, MD;  Location: [facility] ENDOSCOPY;  Service: Cardiovascular;  Laterality: N/A;  . ENTEROSCOPY  02/19/2020   Procedure: ENTEROSCOPY;  Surgeon: Harvel Quale, MD;  Location: [facility] ENDO SUITE;  Service: Gastroenterology;;  . EP IMPLANTABLE DEVICE N/A 10/26/2015   Procedure: Pacemaker Implant;  Surgeon: Will Meredith Leeds, MD;   Location: [place] CV LAB;  Service: Cardiovascular;  Laterality: N/A;  . ESOPHAGOGASTRODUODENOSCOPY (EGD) WITH PROPOFOL N/A 04/07/2018   Procedure: ESOPHAGOGASTRODUODENOSCOPY (EGD) WITH PROPOFOL;  Surgeon: Jerene Bears, MD;  Location: [facility] ENDOSCOPY;  Service: Gastroenterology;  Laterality: N/A;  . ESOPHAGOGASTRODUODENOSCOPY (EGD) WITH PROPOFOL N/A 10/12/2019   Procedure: ESOPHAGOGASTRODUODENOSCOPY (EGD) WITH PROPOFOL;  Surgeon: Danie Binder, MD;  [facility] END[facility];  Service: Endoscopy;  Laterality: N/A;  . EYE SURGERY Bilateral    cataract surgery  . FLEXIBLE SIGMOIDOSCOPY  02/19/2020   Procedure: FLEXIBLE SIGMOIDOSCOPY;  Surgeon: Harvel Quale, MD;  [facility];  Service: Gastroenterology;;  . HIP FRACTURE SURGERY Left 02/2020  . HOT HEMOSTASIS N/A 04/07/2018   Procedure: HOT HEMOSTASIS (ARGON PLASMA COAGULATION/BICAP);  Surgeon: Jerene Bears, MD;  Location: [facility]  ENDOSCOPY;  Service: Gastroenterology;  Laterality: N/A;  . HOT HEMOSTASIS  10/12/2019   Procedure: HOT HEMOSTASIS (ARGON PLASMA COAGULATION/BICAP);  Surgeon: Danie Binder, MD;  [facility] ENDO [facility];  Service: Endoscopy;;  . INTRAMEDULLARY (IM) NAIL INTERTROCHANTERIC Left 03/10/2020   Procedure: OPEN TREATMENT INTERNAL FIXATION LEFT HIP (WITH GAMMA NAIL);  Surgeon: Carole Civil, MD;  Location: [facility] ORS;  Service: Orthopedics;  Laterality: Left;  . NASAL SINUS SURGERY    . PACEMAKER INSERTION    . TEE WITHOUT CARDIOVERSION N/A 10/25/2015   Procedure: TRANSESOPHAGEAL ECHOCARDIOGRAM (TEE);  Surgeon: Burnell Blanks, MD;  Location: [place];  Service: Open Heart Surgery;  Laterality: N/A;  . TRANSCATHETER AORTIC VALVE REPLACEMENT, TRANSFEMORAL Right 10/25/2015   Procedure: TRANSCATHETER AORTIC VALVE REPLACEMENT, TRANSFEMORAL;  Surgeon: Burnell Blanks, MD;  Location: [place];  Service: Open Heart Surgery;  Laterality: Right;      Chief Complaint  Patient presents with  . GI Bleeding       HPI:    Ermelinda Eckert  is a 86 y.o. female with medical history significant ofCKD stage 3b, PAF, dementia, GI bleed, pacemaker implantation, HTN, prior hx of stroke, GERD and chronic diastolic who was discharged to [facility] rehab on 08/16/2020 after work-up for presumed left MCA stroke -Returns to the ED for concerns of melena/GI bleed -Work-up in the ED reveals possible---Ascending Colonic mass--- presumed to be colon cancer--- conference with Dr. Laural Golden gastroenterologist , as well as patient's daughter/POA -Family request outpatient follow-up r with Dr. Jenetta Downer on Monday, 08/22/2020 to discuss possible work-up of this colonic mass and possible transition to palliative care  -ED work-up also revealed possible pneumonia--- patient received Vanco and cefepime in the ED with discharge on Omnicef and azithromycin -No fevers, no leukocytosis, no hypoxia - CBC    Component Value Date/Time   WBC 7.7 08/17/2020 0918   RBC 3.22 (L) 08/17/2020 0918   HGB 9.1 (L) 08/17/2020 1459   HGB 9.8 (L) 08/04/2020 1630   HCT 29.3 (L) 08/17/2020 1459   HCT 30.6 (L) 08/04/2020 1630   PLT 242 08/17/2020 0918   PLT 280 08/04/2020 1630   MCV 94.4 08/17/2020 0918   MCV 89 08/04/2020 1630   MCH 29.2 08/17/2020 0918   MCHC 30.9 08/17/2020 0918   RDW 19.9 (H) 08/17/2020 0918   RDW 19.2 (H) 08/04/2020 1630   LYMPHSABS 0.5 (L) 08/17/2020 0918   LYMPHSABS 0.8 08/04/2020 1630   MONOABS 0.7 08/17/2020 0918   EOSABS 0.1 08/17/2020 0918   EOSABS 0.1 08/04/2020 1630   BASOSABS 0.1 08/17/2020 0918   BASOSABS 0.1 08/04/2020 1630      Review of systems:    In addition to the HPI above,   A full Review of  Systems was done, all other systems reviewed are negative except as noted above in HPI , .    Social History:  Reviewed by me    Social History   Tobacco Use  . Smoking status: Never Smoker  . Smokeless tobacco: Never Used  Substance Use Topics  . Alcohol use: No    Alcohol/week: 0.0  standard drinks       Family History :  Reviewed by me    Family History  Problem Relation Age of Onset  . Hypertension Mother   . Pneumonia Father   . Stomach cancer Brother   . Stroke Brother   . AAA (abdominal aortic aneurysm) Brother   . Cervical cancer Daughter   . Heart attack Neg  Hx   . Colon cancer Neg Hx   . Esophageal cancer Neg Hx      Home Medications:   Prior to Admission medications   Medication Sig Start Date End Date Taking? Authorizing Provider  acetaminophen (TYLENOL) 325 MG tablet Take 2 tablets (650 mg total) by mouth every 4 (four) hours as needed for mild pain (or temp > 37.5 C (99.5 F)). 08/16/20  Yes Barton Dubois, MD  albuterol (PROAIR HFA) 108 (90 Base) MCG/ACT inhaler Inhale 2 puffs into the lungs every 4 (four) hours as needed for wheezing or shortness of breath. 08/05/19  Yes Rakes, Connye Burkitt, FNP  Balsam [place]-Castor Oil OINT Apply 1 application topically daily. Apply to sacrum and bilateral buttocks every shift   Yes [provider]  Biotin 10 MG CAPS Take 10 mg by mouth daily.    Yes [provider]  Budeson-Glycopyrrol-Formoterol (BREZTRI AEROSPHERE) 160-9-4.8 MCG/ACT AERO Inhale 2 Inhalers into the lungs in the morning and at bedtime. 11/27/19  Yes Ivy Lynn, NP  Calcium Carb-Cholecalciferol (CALTRATE 600+D3) 600-800 MG-UNIT TABS Take 1 tablet by mouth daily.   Yes [provider]  cefdinir (OMNICEF) 300 MG capsule Take 1 capsule (300 mg total) by mouth daily for 5 days. 08/18/20 08/23/20 Yes Norris Bodley, MD  docusate sodium (COLACE) 100 MG capsule Take 1 capsule (100 mg total) by mouth 2 (two) times daily. 03/15/20  Yes Barton Dubois, MD  doxycycline (VIBRA-TABS) 100 MG tablet Take 1 tablet (100 mg total) by mouth 2 (two) times daily for 5 days. 08/17/20 08/22/20 Yes Roxan Hockey, MD  EUTHYROX 25 MCG tablet Take 1 tablet by mouth once daily 03/07/20  Yes Stacks, [name], MD  famotidine (PEPCID) 20 MG tablet Take 1  tablet (20 mg total) by mouth at bedtime. 12/21/19 12/20/20 Yes Stacks, [name], MD  folic acid (FOLVITE) 1 MG tablet Take 1 tablet (1 mg total) by mouth daily. 06/21/20  Yes Kilroy, Luke K, PA-C  furosemide (LASIX) 40 MG tablet Take 1 tablet (40 mg total) by mouth daily. 08/17/20  Yes Barton Dubois, MD  guaiFENesin (MUCINEX) 600 MG 12 hr tablet Take 1 tablet (600 mg total) by mouth 2 (two) times daily for 10 days. 08/17/20 08/27/20 Yes Margree Gimbel, MD  iron polysaccharides (NIFEREX) 150 MG capsule Take 1 capsule (150 mg total) by mouth daily. 06/29/20  Yes Lelon Perla, MD  levalbuterol Penne Lash) 1.25 MG/3ML nebulizer solution Take 1.25 mg by nebulization every 4 (four) hours as needed for wheezing. Dx J45.909 07/07/20  Yes [name], MD  memantine (NAMENDA) 10 MG tablet Take 1 tablet (10 mg total) by mouth 2 (two) times daily. 07/26/20  Yes Frann Rider, NP  metoprolol tartrate (LOPRESSOR) 25 MG tablet Take 1 tablet (25 mg total) by mouth 2 (two) times daily. 05/20/19  Yes Camnitz, Will Hassell Done, MD  pantoprazole (PROTONIX) 40 MG tablet Take 1 tablet by mouth twice daily Patient taking differently: Take 40 mg by mouth 2 (two) times daily. 07/13/20  Yes Stacks, [name], MD  potassium chloride (KLOR-CON) 10 MEQ tablet Take 2 tablets (20 mEq total) by mouth daily. 08/16/20  Yes Barton Dubois, MD     Allergies:     Allergies  Allergen Reactions  . Hctz [Hydrochlorothiazide] Other (See Comments)    Pt was ill and this affected her kidneys   . Aspirin Other (See Comments)    Cardiologist said the patient is to not take this  . Codeine Other (See Comments)  Made the patient feel ill, has not had any problems since 1977     Physical Exam:   Vitals  Blood pressure (!) 143/64, pulse 60, temperature 99.2 F (37.3 C), temperature source Oral, resp. rate 20, SpO2 97 %.  Physical Examination: General appearance - alert, chronically ill appearing, and in no distress Mental status  -underlying memory and cognitive deficits  eyes - sclera anicteric Neck - supple, no JVD elevation , Chest -  mostly clear, no wheezing Heart - S1 and S2 normal, regular  Abdomen - soft, nontender, nondistended, no masses or organomegaly Neurological -patient has generalized weakness, per daughter no new focal deficits today  extremities - no pedal edema noted, intact peripheral pulses  Skin - warm, dry     Data Review:    CBC Recent Labs  Lab 08/13/20 1215 08/13/20 1222 08/17/20 0918 08/17/20 1459  WBC 7.5  --  7.7  --   HGB 9.4* 10.5* 9.4* 9.1*  HCT 31.8* 31.0* 30.4* 29.3*  PLT 270  --  242  --   MCV 95.8  --  94.4  --   MCH 28.3  --  29.2  --   MCHC 29.6*  --  30.9  --   RDW 20.7*  --  19.9*  --   LYMPHSABS 0.8  --  0.5*  --   MONOABS 0.5  --  0.7  --   EOSABS 0.1  --  0.1  --   BASOSABS 0.1  --  0.1  --    ------------------------------------------------------------------------------------------------------------------  Chemistries  Recent Labs  Lab 08/13/20 1215 08/13/20 1222 08/17/20 0918  NA 135 136 136  K 3.9 3.8 3.7  CL 98 97* 99  CO2 29  --  28  GLUCOSE 122* 116* 111*  BUN 24* 25* 17  CREATININE 1.28* 1.30* 1.16*  CALCIUM 8.7*  --  8.9  AST 26  --  24  ALT 15  --  16  ALKPHOS 92  --  95  BILITOT 0.7  --  1.2   ------------------------------------------------------------------------------------------------------------------ estimated creatinine clearance is 23.8 mL/min (A) (by C-G formula based on SCr of 1.16 mg/dL (H)). ------------------------------------------------------------------------------------------------------------------ No results for input(s): TSH, T4TOTAL, T3FREE, THYROIDAB in the last 72 hours.  Invalid input(s): FREET3   Coagulation profile Recent Labs  Lab 08/13/20 1215 08/17/20 0918  INR 1.2 1.2   ------------------------------------------------------------------------------------------------------------------- No  results for input(s): DDIMER in the last 72 hours. -------------------------------------------------------------------------------------------------------------------  Cardiac Enzymes No results for input(s): CKMB, TROPONINI, MYOGLOBIN in the last 168 hours.  Invalid input(s): CK ------------------------------------------------------------------------------------------------------------------    Component Value Date/Time   BNP 313.0 (H) 02/12/2020 0752     ---------------------------------------------------------------------------------------------------------------  Urinalysis    Component Value Date/Time   COLORURINE STRAW (A) 08/13/2020 1209   APPEARANCEUR CLEAR 08/13/2020 1209   APPEARANCEUR Hazy (A) 02/09/2020 1400   LABSPEC 1.026 08/13/2020 1209   PHURINE 8.0 08/13/2020 1209   GLUCOSEU NEGATIVE 08/13/2020 1209   [place] 08/13/2020 1209   [place] 08/13/2020 1209   BILIRUBINUR Negative 02/09/2020 [place] 08/13/2020 1209   PROTEINUR NEGATIVE 08/13/2020 1209   NITRITE NEGATIVE 08/13/2020 1209   [place] 08/13/2020 1209    ----------------------------------------------------------------------------------------------------------------   Imaging Results:    CT Chest W Contrast  Result Date: 08/17/2020 CLINICAL DATA:  Abdominal distension. Suspicion of diverticulitis. Melena. Appendectomy and bladder surgery EXAM: CT CHEST, ABDOMEN, AND PELVIS WITH CONTRAST TECHNIQUE: Multidetector CT imaging of the chest, abdomen and pelvis was performed following the standard protocol  during bolus administration of intravenous contrast. CONTRAST:  11mL OMNIPAQUE IOHEXOL 300 MG/ML  SOLN COMPARISON:  CT chest 07/06/2016 FINDINGS: CT CHEST FINDINGS Cardiovascular: Aortic valve repair. Pacemaker noted. Coronary artery calcification and aortic atherosclerotic calcification. Mediastinum/Nodes: No axillary supraclavicular adenopathy. No mediastinal  or hilar adenopathy. No pericardial fluid. Lungs/Pleura: Several foci of peribronchial thickening scattered throughout the LEFT and RIGHT lungs. For example in the RIGHT upper lobe on image 59/3. LEFT upper lobe image 47/3. Small RIGHT effusion. Small focus of consolidation at the RIGHT lung base representing atelectasis or potentially pneumonia (image 115/3). Musculoskeletal: No aggressive osseous lesion. CT ABDOMEN AND PELVIS FINDINGS Hepatobiliary: No focal hepatic lesion. No biliary ductal dilatation. Gallbladder is normal. Common bile duct is normal. Pancreas: Pancreas is normal. No ductal dilatation. No pancreatic inflammation. Spleen: Normal spleen Adrenals/urinary tract: Adrenal glands and kidneys normal. Multiple diverticula of the bladder. Stomach/Bowel: Stomach, duodenum,and small-bowel normal. Terminal ileum is normal. Within the ascending colon there is a 5.5 cm segment of circumferential bowel wall submucosal thickening (axial image 88/2 and coronal image 63/4). No evidence of obstruction proximal to this the circumferential submucosal thickening. The distal colon (transverse and descending colon) is normal. There is stool in the rectosigmoid colon. Rectum normal. Vascular/Lymphatic: Abdominal aorta normal caliber with intimal calcifications. Small retrocrural lymph node measuring 7 mm on image 50/2). No additional abdominal or mesenteric adenopathy. Reproductive: Uterus and adnexa unremarkable. Other: No peritoneal nodularity.  No free fluid. Musculoskeletal: Internal fixation of RIGHT hip fracture. No aggressive osseous lesion IMPRESSION: Chest Impression: 1. Scattered peribronchial thickening in LEFT and RIGHT lung suggest mild pulmonary inflammation or infection. 2. Focus of consolidation with pleural fluid at the RIGHT lung base differential include pneumonia or round atelectasis. Abdomen / Pelvis Impression: 1. Circumferential submucosal thickening over 6 cm segment of the ascending colon.  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 Recommend endoscopy for further evaluation. 2. No evidence of metastatic adenopathy other than a small retrocrural lymph node which is favored unrelated 3. No liver metastasis. 4. Multiple diverticula bladder. Electronically Signed   By: Suzy Bouchard M.D.   On: 08/17/2020 14:26   CT Abdomen Pelvis W Contrast  Result Date: 08/17/2020 CLINICAL DATA:  Abdominal distension. Suspicion of diverticulitis. Melena. Appendectomy and bladder surgery EXAM: CT CHEST, ABDOMEN, AND PELVIS WITH CONTRAST TECHNIQUE: Multidetector CT imaging of the chest, abdomen and pelvis was performed following the standard protocol during bolus administration of intravenous contrast. CONTRAST:  42mL OMNIPAQUE IOHEXOL 300 MG/ML  SOLN COMPARISON:  CT chest 07/06/2016 FINDINGS: CT CHEST FINDINGS Cardiovascular: Aortic valve repair. Pacemaker noted. Coronary artery calcification and aortic atherosclerotic calcification. Mediastinum/Nodes: No axillary supraclavicular adenopathy. No mediastinal or hilar adenopathy. No pericardial fluid. Lungs/Pleura: Several foci of peribronchial thickening scattered throughout the LEFT and RIGHT lungs. For example in the RIGHT upper lobe on image 59/3. LEFT upper lobe image 47/3. Small RIGHT effusion. Small focus of consolidation at the RIGHT lung base representing atelectasis or potentially pneumonia (image 115/3). Musculoskeletal: No aggressive osseous lesion. CT ABDOMEN AND PELVIS FINDINGS Hepatobiliary: No focal hepatic lesion. No biliary ductal dilatation. Gallbladder is normal. Common bile duct is normal. Pancreas: Pancreas is normal. No ductal dilatation. No pancreatic inflammation. Spleen: Normal spleen Adrenals/urinary tract: Adrenal glands and kidneys normal. Multiple diverticula of the bladder. Stomach/Bowel: Stomach, duodenum,and small-bowel normal. Terminal ileum is normal. Within the ascending colon there is a 5.5 cm segment of circumferential bowel  wall submucosal thickening (axial image 88/2 and coronal image 63/4). No evidence of obstruction proximal to this the circumferential  submucosal thickening. The distal colon (transverse and descending colon) is normal. There is stool in the rectosigmoid colon. Rectum normal. Vascular/Lymphatic: Abdominal aorta normal caliber with intimal calcifications. Small retrocrural lymph node measuring 7 mm on image 50/2). No additional abdominal or mesenteric adenopathy. Reproductive: Uterus and adnexa unremarkable. Other: No peritoneal nodularity.  No free fluid. Musculoskeletal: Internal fixation of RIGHT hip fracture. No aggressive osseous lesion IMPRESSION: Chest Impression: 1. Scattered peribronchial thickening in LEFT and RIGHT lung suggest mild pulmonary inflammation or infection. 2. Focus of consolidation with pleural fluid at the RIGHT lung base differential include pneumonia or round atelectasis. Abdomen / Pelvis Impression: 1. Circumferential submucosal thickening over 6 cm segment of the ascending colon. 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 Recommend endoscopy for further evaluation. 2. No evidence of metastatic adenopathy other than a small retrocrural lymph node which is favored unrelated 3. No liver metastasis. 4. Multiple diverticula bladder. Electronically Signed   By: Suzy Bouchard M.D.   On: 08/17/2020 14:26   DG Chest Port 1 View  Result Date: 08/17/2020 CLINICAL DATA:  Gastrointestinal bleeding. EXAM: PORTABLE CHEST 1 VIEW COMPARISON:  August 04, 2020. FINDINGS: Stable cardiomegaly. Status post transcatheter aortic valve repair. Stable position of left-sided pacemaker. No pneumothorax or pleural effusion is noted. Stable bilateral upper lobe opacities are noted concerning for scarring or possibly pneumonia. Bony thorax is unremarkable. IMPRESSION: Stable bilateral upper lobe opacities are noted concerning for scarring or possibly pneumonia. Stable cardiomegaly. Aortic Atherosclerosis  (ICD10-I70.0). Electronically Signed   By: Marijo Conception M.D.   On: 08/17/2020 10:08    Radiological Exams on Admission: CT Chest W Contrast  Result Date: 08/17/2020 CLINICAL DATA:  Abdominal distension. Suspicion of diverticulitis. Melena. Appendectomy and bladder surgery EXAM: CT CHEST, ABDOMEN, AND PELVIS WITH CONTRAST TECHNIQUE: Multidetector CT imaging of the chest, abdomen and pelvis was performed following the standard protocol during bolus administration of intravenous contrast. CONTRAST:  74mL OMNIPAQUE IOHEXOL 300 MG/ML  SOLN COMPARISON:  CT chest 07/06/2016 FINDINGS: CT CHEST FINDINGS Cardiovascular: Aortic valve repair. Pacemaker noted. Coronary artery calcification and aortic atherosclerotic calcification. Mediastinum/Nodes: No axillary supraclavicular adenopathy. No mediastinal or hilar adenopathy. No pericardial fluid. Lungs/Pleura: Several foci of peribronchial thickening scattered throughout the LEFT and RIGHT lungs. For example in the RIGHT upper lobe on image 59/3. LEFT upper lobe image 47/3. Small RIGHT effusion. Small focus of consolidation at the RIGHT lung base representing atelectasis or potentially pneumonia (image 115/3). Musculoskeletal: No aggressive osseous lesion. CT ABDOMEN AND PELVIS FINDINGS Hepatobiliary: No focal hepatic lesion. No biliary ductal dilatation. Gallbladder is normal. Common bile duct is normal. Pancreas: Pancreas is normal. No ductal dilatation. No pancreatic inflammation. Spleen: Normal spleen Adrenals/urinary tract: Adrenal glands and kidneys normal. Multiple diverticula of the bladder. Stomach/Bowel: Stomach, duodenum,and small-bowel normal. Terminal ileum is normal. Within the ascending colon there is a 5.5 cm segment of circumferential bowel wall submucosal thickening (axial image 88/2 and coronal image 63/4). No evidence of obstruction proximal to this the circumferential submucosal thickening. The distal colon (transverse and descending colon) is  normal. There is stool in the rectosigmoid colon. Rectum normal. Vascular/Lymphatic: Abdominal aorta normal caliber with intimal calcifications. Small retrocrural lymph node measuring 7 mm on image 50/2). No additional abdominal or mesenteric adenopathy. Reproductive: Uterus and adnexa unremarkable. Other: No peritoneal nodularity.  No free fluid. Musculoskeletal: Internal fixation of RIGHT hip fracture. No aggressive osseous lesion IMPRESSION: Chest Impression: 1. Scattered peribronchial thickening in LEFT and RIGHT lung suggest mild pulmonary inflammation or infection. 2. Focus of  consolidation with pleural fluid at the RIGHT lung base differential include pneumonia or round atelectasis. Abdomen / Pelvis Impression: 1. Circumferential submucosal thickening over 6 cm segment of the ascending colon. 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 Recommend endoscopy for further evaluation. 2. No evidence of metastatic adenopathy other than a small retrocrural lymph node which is favored unrelated 3. No liver metastasis. 4. Multiple diverticula bladder. Electronically Signed   By: Suzy Bouchard M.D.   On: 08/17/2020 14:26   CT Abdomen Pelvis W Contrast  Result Date: 08/17/2020 CLINICAL DATA:  Abdominal distension. Suspicion of diverticulitis. Melena. Appendectomy and bladder surgery EXAM: CT CHEST, ABDOMEN, AND PELVIS WITH CONTRAST TECHNIQUE: Multidetector CT imaging of the chest, abdomen and pelvis was performed following the standard protocol during bolus administration of intravenous contrast. CONTRAST:  22mL OMNIPAQUE IOHEXOL 300 MG/ML  SOLN COMPARISON:  CT chest 07/06/2016 FINDINGS: CT CHEST FINDINGS Cardiovascular: Aortic valve repair. Pacemaker noted. Coronary artery calcification and aortic atherosclerotic calcification. Mediastinum/Nodes: No axillary supraclavicular adenopathy. No mediastinal or hilar adenopathy. No pericardial fluid. Lungs/Pleura: Several foci of peribronchial thickening scattered  throughout the LEFT and RIGHT lungs. For example in the RIGHT upper lobe on image 59/3. LEFT upper lobe image 47/3. Small RIGHT effusion. Small focus of consolidation at the RIGHT lung base representing atelectasis or potentially pneumonia (image 115/3). Musculoskeletal: No aggressive osseous lesion. CT ABDOMEN AND PELVIS FINDINGS Hepatobiliary: No focal hepatic lesion. No biliary ductal dilatation. Gallbladder is normal. Common bile duct is normal. Pancreas: Pancreas is normal. No ductal dilatation. No pancreatic inflammation. Spleen: Normal spleen Adrenals/urinary tract: Adrenal glands and kidneys normal. Multiple diverticula of the bladder. Stomach/Bowel: Stomach, duodenum,and small-bowel normal. Terminal ileum is normal. Within the ascending colon there is a 5.5 cm segment of circumferential bowel wall submucosal thickening (axial image 88/2 and coronal image 63/4). No evidence of obstruction proximal to this the circumferential submucosal thickening. The distal colon (transverse and descending colon) is normal. There is stool in the rectosigmoid colon. Rectum normal. Vascular/Lymphatic: Abdominal aorta normal caliber with intimal calcifications. Small retrocrural lymph node measuring 7 mm on image 50/2). No additional abdominal or mesenteric adenopathy. Reproductive: Uterus and adnexa unremarkable. Other: No peritoneal nodularity.  No free fluid. Musculoskeletal: Internal fixation of RIGHT hip fracture. No aggressive osseous lesion IMPRESSION: Chest Impression: 1. Scattered peribronchial thickening in LEFT and RIGHT lung suggest mild pulmonary inflammation or infection. 2. Focus of consolidation with pleural fluid at the RIGHT lung base differential include pneumonia or round atelectasis. Abdomen / Pelvis Impression: 1. Circumferential submucosal thickening over 6 cm segment of the ascending colon. 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 Recommend endoscopy for further evaluation. 2. No evidence of  metastatic adenopathy other than a small retrocrural lymph node which is favored unrelated 3. No liver metastasis. 4. Multiple diverticula bladder. Electronically Signed   By: Suzy Bouchard M.D.   On: 08/17/2020 14:26   DG Chest Port 1 View  Result Date: 08/17/2020 CLINICAL DATA:  Gastrointestinal bleeding. EXAM: PORTABLE CHEST 1 VIEW COMPARISON:  August 04, 2020. FINDINGS: Stable cardiomegaly. Status post transcatheter aortic valve repair. Stable position of left-sided pacemaker. No pneumothorax or pleural effusion is noted. Stable bilateral upper lobe opacities are noted concerning for scarring or possibly pneumonia. Bony thorax is unremarkable. IMPRESSION: Stable bilateral upper lobe opacities are noted concerning for scarring or possibly pneumonia. Stable cardiomegaly. Aortic Atherosclerosis (ICD10-I70.0). Electronically Signed   By: Marijo Conception M.D.   On: 08/17/2020 10:08   Family Communication: Admission, patients condition and plan of care including tests  being ordered have been discussed with the patient and her 2 daughters  who indicate understanding and agree with the plan   Code Status - DNR Likely DC to  [facility]  Condition   Stable  Allergies as of 08/17/2020      Reactions   Hctz [hydrochlorothiazide] Other (See Comments)   Pt was ill and this affected her kidneys   Aspirin Other (See Comments)   Cardiologist said the patient is to not take this   Codeine Other (See Comments)   Made the patient feel ill, has not had any problems since 1977      Medication List    TAKE these medications   acetaminophen 325 MG tablet Commonly known as: TYLENOL Take 2 tablets (650 mg total) by mouth every 4 (four) hours as needed for mild pain (or temp > 37.5 C (99.5 F)).   albuterol 108 (90 Base) MCG/ACT inhaler Commonly known as: ProAir HFA Inhale 2 puffs into the lungs every 4 (four) hours as needed for wheezing or shortness of breath.   Balsam [place]-Castor Oil Oint Apply 1  application topically daily. Apply to sacrum and bilateral buttocks every shift   Biotin 10 MG Caps Take 10 mg by mouth daily.   Breztri Aerosphere 160-9-4.8 MCG/ACT Aero Generic drug: Budeson-Glycopyrrol-Formoterol Inhale 2 Inhalers into the lungs in the morning and at bedtime.   Caltrate 600+D3 600-800 MG-UNIT Tabs Generic drug: Calcium Carb-Cholecalciferol Take 1 tablet by mouth daily.   cefdinir 300 MG capsule Commonly known as: OMNICEF Take 1 capsule (300 mg total) by mouth daily for 5 days. Start taking on: August 18, 2020   docusate sodium 100 MG capsule Commonly known as: COLACE Take 1 capsule (100 mg total) by mouth 2 (two) times daily.   doxycycline 100 MG tablet Commonly known as: VIBRA-TABS Take 1 tablet (100 mg total) by mouth 2 (two) times daily for 5 days.   Euthyrox 25 MCG tablet Generic drug: levothyroxine Take 1 tablet by mouth once daily   famotidine 20 MG tablet Commonly known as: Pepcid Take 1 tablet (20 mg total) by mouth at bedtime.   folic acid 1 MG tablet Commonly known as: FOLVITE Take 1 tablet (1 mg total) by mouth daily.   furosemide 40 MG tablet Commonly known as: LASIX Take 1 tablet (40 mg total) by mouth daily.   guaiFENesin 600 MG 12 hr tablet Commonly known as: Mucinex Take 1 tablet (600 mg total) by mouth 2 (two) times daily for 10 days.   iron polysaccharides 150 MG capsule Commonly known as: NIFEREX Take 1 capsule (150 mg total) by mouth daily.   levalbuterol 1.25 MG/3ML nebulizer solution Commonly known as: XOPENEX Take 1.25 mg by nebulization every 4 (four) hours as needed for wheezing. Dx J45.909   memantine 10 MG tablet Commonly known as: NAMENDA Take 1 tablet (10 mg total) by mouth 2 (two) times daily.   metoprolol tartrate 25 MG tablet Commonly known as: LOPRESSOR Take 1 tablet (25 mg total) by mouth 2 (two) times daily.   pantoprazole 40 MG tablet Commonly known as: PROTONIX Take 1 tablet by mouth twice  daily   potassium chloride 10 MEQ tablet Commonly known as: KLOR-CON Take 2 tablets (20 mEq total) by mouth daily.        Roxan Hockey M.D on 08/17/2020 at 3:55 PM Go to [email or web address] -  for contact info  [facility] - Office  [phone]

## 2020-08-17 NOTE — ED Notes (Signed)
Family gone to get food for pt.

## 2020-08-17 NOTE — ED Notes (Signed)
Report given to [facility]. 

## 2020-08-17 NOTE — ED Provider Notes (Signed)
[facility] EMERGENCY DEPARTMENT Provider Note   CSN: [id number] Arrival date & time: 08/17/20  6712     History Chief Complaint  Patient presents with  . GI Bleeding    Crystal Cooper is a 87 y.o. female.  Patient brought in from [facility] just discharged from hospitalization following CVA yesterday.  They noted that she had black stool today.  So she was referred back.  Patient appears to be in no acute distress.  But does have baseline mental status changes secondary to the stroke.  Patient is not on any blood thinners.  From chart review patient has had GI bleed problems before that is why she is not on any blood thinners.        Past Medical History:  Diagnosis Date  . Acute CVA (cerebrovascular accident) ([place]) 08/27/2019  . Acute GI bleeding 06/22/2020  . Acute renal failure superimposed on stage 3b chronic kidney disease ([place])   . AKI (acute kidney injury) ([place])   . Anemia    years ago  . Angio-edema, initial encounter 08/23/2019  . Aortic stenosis   . Arthritis   . Asthma   . Atrial fibrillation ([place])   . Blood transfusion without reported diagnosis   . CHF (congestive heart failure) ([place]) 11/2014  . CKD (chronic kidney disease)    had many uti this year   . Closed left hip fracture, initial encounter ([place]) 03/09/2020  . Dementia ([place])   . Family history of adverse reaction to anesthesia    2 daughters would have N/V  . Gastric AVM   . GERD (gastroesophageal reflux disease)    was seen by Gi for bleeding ulcer which was cauterized   . GI bleed   . Glaucoma   . Hearing loss   . Heart murmur    Rheumatic fever whe she was young,  sees Dr. Stanford Breed in [place]  . HTN (hypertension)   . Hypokalemia   . Myocardial infarction ([place])    in her 72 when she had two heart attacks   . PNA (pneumonia) 08/25/2019  . Pneumonia    stayed from friday to sunday in august  . Presence of permanent cardiac pacemaker    placed about 4 years ago  . Prolapsing mitral  leaflet syndrome   . Shortness of breath dyspnea    with exertion  . Stroke ([place])   . SVT (supraventricular tachycardia) (HCC)    S/P ablation of AVNRT in 2003    Patient Active Problem List   Diagnosis Date Noted  . Physical debility   . Pressure injury of skin 08/14/2020  . Stroke ([place]) 08/14/2020  . Stroke-like symptoms 08/13/2020  . Rectal bleeding 07/07/2020  . Impairment of balance 06/28/2020  . Status post hip surgery   . Upper GI bleed 02/19/2020  . Acute blood loss anemia 02/19/2020  . CKD (chronic kidney disease) stage 3, GFR 30-59 ml/min (HCC) 02/19/2020  . Hematochezia 02/19/2020  . Iron deficiency anemia due to chronic blood loss 02/18/2020  . AVM (arteriovenous malformation) 02/18/2020  . COPD (chronic obstructive pulmonary disease) ([place]) 02/18/2020  . Dementia without behavioral disturbance ([place]) 02/18/2020  . SOB (shortness of breath)   . COPD with acute exacerbation ([place])   . CAP (community acquired pneumonia) 02/12/2020  . Long term (current) use of anticoagulants   . Wheezing 09/23/2019  . Abnormal breath sounds 09/09/2019  . Hypokalemia 08/23/2019  . Reactive airway disease 03/24/2019  . Gastroesophageal reflux disease 03/24/2019  . GI  bleed 05/12/2018  . 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 Diastolic dysfunction 96/28/3662  . Pacemaker 05/12/2018  . Iron deficiency anemia   . Melena   . Duodenal arteriovenous malformation   . Gastritis and gastroduodenitis   . Normocytic anemia 04/06/2018  . Osteoporosis 08/27/2016  . ASCVD (arteriosclerotic cardiovascular disease) 01/11/2016  . Asthma 01/11/2016  . Glaucoma 01/11/2016  . Hearing loss 01/11/2016  . Other nonrheumatic mitral valve disorders 01/11/2016  . Junctional bradycardia   . PAF (paroxysmal atrial fibrillation) ([place]) 09/06/2015  . Hyponatremia 12/17/2014  . Congestive heart failure ([place]) 12/12/2014  . Murmur 10/19/2011  . Essential hypertension 10/19/2011  .  History of cardiac radiofrequency ablation (RFA) 10/19/2011    Past Surgical History:  Procedure Laterality Date  . APPENDECTOMY    . BIOPSY  04/07/2018   Procedure: BIOPSY;  Surgeon: Jerene Bears, MD;  Location: [facility] ENDOSCOPY;  Service: Gastroenterology;;  . BIOPSY  02/19/2020   Procedure: BIOPSY;  Surgeon: Harvel Quale, MD;  Location: [facility] ENDO SUITE;  Service: Gastroenterology;;  gastric   . BLADDER SURGERY    . CARDIAC CATHETERIZATION N/A 09/26/2015   Procedure: Right/Left Heart Cath and Coronary Angiography;  Surgeon: Burnell Blanks, MD;  Location: [place] CV LAB;  Service: Cardiovascular;  Laterality: N/A;  . CARDIAC SURGERY    . CARDIOVERSION N/A 03/02/2016   Procedure: CARDIOVERSION;  Surgeon: Thayer Headings, MD;  Location: [facility] ENDOSCOPY;  Service: Cardiovascular;  Laterality: N/A;  . ENTEROSCOPY  02/19/2020   Procedure: ENTEROSCOPY;  Surgeon: Harvel Quale, MD;  Location: [facility] ENDO SUITE;  Service: Gastroenterology;;  . EP IMPLANTABLE DEVICE N/A 10/26/2015   Procedure: Pacemaker Implant;  Surgeon: Will Meredith Leeds, MD;  Location: [place] CV LAB;  Service: Cardiovascular;  Laterality: N/A;  . ESOPHAGOGASTRODUODENOSCOPY (EGD) WITH PROPOFOL N/A 04/07/2018   Procedure: ESOPHAGOGASTRODUODENOSCOPY (EGD) WITH PROPOFOL;  Surgeon: Jerene Bears, MD;  Location: [facility] ENDOSCOPY;  Service: Gastroenterology;  Laterality: N/A;  . ESOPHAGOGASTRODUODENOSCOPY (EGD) WITH PROPOFOL N/A 10/12/2019   Procedure: ESOPHAGOGASTRODUODENOSCOPY (EGD) WITH PROPOFOL;  Surgeon: Danie Binder, MD;  Location: [facility] ENDO SU[facility];  Service: Endoscopy;  Laterality: N/A;  . EYE SURGERY Bilateral    cataract surgery  . FLEXIBLE SIGMOIDOSCOPY  02/19/2020   Procedure: FLEXIBLE SIGMOIDOSCOPY;  Surgeon: Harvel Quale, MD;  [facility]: [facility] END[facility];  Service: Gastroenterology;;  . HIP FRACTURE SURGERY Left 02/2020  . HOT HEMOSTASIS N/A 04/07/2018   Procedure: HOT HEMOSTASIS (ARGON  PLASMA COAGULATION/BICAP);  Surgeon: Jerene Bears, MD;  Location: [facility] ENDOSCOPY;  Service: Gastroenterology;  Laterality: N/A;  . HOT HEMOSTASIS  10/12/2019   Procedure: HOT HEMOSTASIS (ARGON PLASMA COAGULATION/BICAP);  Surgeon: Danie Binder, MD;  Location: [facility] ENDO SU[facility];  Service: Endoscopy;;  . INTRAMEDULLARY (IM) NAIL INTERTROCHANTERIC Left 03/10/2020   Procedure: OPEN TREATMENT INTERNAL FIXATION LEFT HIP (WITH GAMMA NAIL);  Surgeon: Carole Civil, MD;  Location: [facility] ORS;  Service: Orthopedics;  Laterality: Left;  . NASAL SINUS SURGERY    . PACEMAKER INSERTION    . TEE WITHOUT CARDIOVERSION N/A 10/25/2015   Procedure: TRANSESOPHAGEAL ECHOCARDIOGRAM (TEE);  Surgeon: Burnell Blanks, MD;  Location: [place];  Service: Open Heart Surgery;  Laterality: N/A;  . TRANSCATHETER AORTIC VALVE REPLACEMENT, TRANSFEMORAL Right 10/25/2015   Procedure: TRANSCATHETER AORTIC VALVE REPLACEMENT, TRANSFEMORAL;  Surgeon: Burnell Blanks, MD;  Location: [place];  Service: Open Heart Surgery;  Laterality: Right;     OB History    Gravida  3  Para  3   Term  3   Preterm      AB      Living  2     SAB      IAB      Ectopic      Multiple      Live Births              Family History  Problem Relation Age of Onset  . Hypertension Mother   . Pneumonia Father   . Stomach cancer Brother   . Stroke Brother   . AAA (abdominal aortic aneurysm) Brother   . Cervical cancer Daughter   . Heart attack Neg Hx   . Colon cancer Neg Hx   . Esophageal cancer Neg Hx     Social History   Tobacco Use  . Smoking status: Never Smoker  . Smokeless tobacco: Never Used  Vaping Use  . Vaping Use: Never used  Substance Use Topics  . Alcohol use: No    Alcohol/week: 0.0 standard drinks  . Drug use: No    Home Medications Prior to Admission medications   Medication Sig Start Date End Date Taking? Authorizing Provider  acetaminophen (TYLENOL) 325 MG tablet Take 2 tablets (650 mg total)  by mouth every 4 (four) hours as needed for mild pain (or temp > 37.5 C (99.5 F)). 08/16/20  Yes Barton Dubois, MD  albuterol (PROAIR HFA) 108 (90 Base) MCG/ACT inhaler Inhale 2 puffs into the lungs every 4 (four) hours as needed for wheezing or shortness of breath. 08/05/19  Yes Rakes, Connye Burkitt, FNP  Balsam [place]-Castor Oil OINT Apply 1 application topically daily. Apply to sacrum and bilateral buttocks every shift   Yes [provider]  Biotin 10 MG CAPS Take 10 mg by mouth daily.    Yes [provider]  Budeson-Glycopyrrol-Formoterol (BREZTRI AEROSPHERE) 160-9-4.8 MCG/ACT AERO Inhale 2 Inhalers into the lungs in the morning and at bedtime. 11/27/19  Yes Ivy Lynn, NP  Calcium Carb-Cholecalciferol (CALTRATE 600+D3) 600-800 MG-UNIT TABS Take 1 tablet by mouth daily.   Yes [provider]  docusate sodium (COLACE) 100 MG capsule Take 1 capsule (100 mg total) by mouth 2 (two) times daily. 03/15/20  Yes Barton Dubois, MD  EUTHYROX 25 MCG tablet Take 1 tablet by mouth once daily 03/07/20  Yes Stacks, [name], MD  famotidine (PEPCID) 20 MG tablet Take 1 tablet (20 mg total) by mouth at bedtime. 12/21/19 12/20/20 Yes Stacks, [name], MD  folic acid (FOLVITE) 1 MG tablet Take 1 tablet (1 mg total) by mouth daily. 06/21/20  Yes Kilroy, Luke K, PA-C  furosemide (LASIX) 40 MG tablet Take 1 tablet (40 mg total) by mouth daily. 08/17/20  Yes Barton Dubois, MD  iron polysaccharides (NIFEREX) 150 MG capsule Take 1 capsule (150 mg total) by mouth daily. 06/29/20  Yes Lelon Perla, MD  levalbuterol Penne Lash) 1.25 MG/3ML nebulizer solution Take 1.25 mg by nebulization every 4 (four) hours as needed for wheezing. Dx J45.909 07/07/20  Yes [name], MD  memantine (NAMENDA) 10 MG tablet Take 1 tablet (10 mg total) by mouth 2 (two) times daily. 07/26/20  Yes Frann Rider, NP  metoprolol tartrate (LOPRESSOR) 25 MG tablet Take 1 tablet (25 mg total) by mouth 2 (two) times daily. 05/20/19  Yes  Camnitz, Will Hassell Done, MD  pantoprazole (PROTONIX) 40 MG tablet Take 1 tablet by mouth twice daily Patient taking differently: Take 40 mg by mouth 2 (two) times daily. 07/13/20  Yes [name], MD  potassium chloride (KLOR-CON) 10 MEQ tablet Take 2 tablets (20 mEq total) by mouth daily. 08/16/20  Yes Barton Dubois, MD    Allergies    Hctz [hydrochlorothiazide], Aspirin, and Codeine  Review of Systems   Review of Systems  Unable to perform ROS: Dementia    Physical Exam Updated Vital Signs BP 140/68   Pulse (!) 59   Temp 99.2 F (37.3 C) (Oral)   Resp 19   SpO2 98%   Physical Exam Vitals and nursing note reviewed.  Constitutional:      General: She is not in acute distress.    Appearance: Normal appearance. She is well-developed and well-nourished.  HENT:     Head: Normocephalic and atraumatic.  Eyes:     Extraocular Movements: Extraocular movements intact.     Conjunctiva/sclera: Conjunctivae normal.     Pupils: Pupils are equal, round, and reactive to light.  Cardiovascular:     Rate and Rhythm: Normal rate and regular rhythm.     Heart sounds: No murmur heard.   Pulmonary:     Effort: Pulmonary effort is normal. No respiratory distress.     Breath sounds: Normal breath sounds.  Abdominal:     Palpations: Abdomen is soft.     Tenderness: There is no abdominal tenderness.  Genitourinary:    Rectum: Guaiac result positive.     Comments: Stool black in color no gross blood. Musculoskeletal:        General: No edema.     Cervical back: Normal range of motion and neck supple.  Skin:    General: Skin is warm and dry.     Capillary Refill: Capillary refill takes less than 2 seconds.  Neurological:     Mental Status: She is alert. Mental status is at baseline.     Comments: According to family member patient is baseline as far as her mental status and neuro exam.  Psychiatric:        Mood and Affect: Mood and affect normal.     ED Results / Procedures /  Treatments   Labs (all labs ordered are listed, but only abnormal results are displayed) Labs Reviewed  CBC WITH DIFFERENTIAL/PLATELET - Abnormal; Notable for the following components:      Result Value   RBC 3.22 (*)    Hemoglobin 9.4 (*)    HCT 30.4 (*)    RDW 19.9 (*)    Lymphs Abs 0.5 (*)    All other components within normal limits  COMPREHENSIVE METABOLIC PANEL - Abnormal; Notable for the following components:   Glucose, Bld 111 (*)    Creatinine, Ser 1.16 (*)    Total Protein 6.0 (*)    Albumin 2.7 (*)    GFR, Estimated 45 (*)    All other components within normal limits  POC OCCULT BLOOD, ED - Abnormal; Notable for the following components:   Fecal Occult Bld POSITIVE (*)    All other components within normal limits  SARS CORONAVIRUS 2 (TAT 6-24 HRS)  PROTIME-INR  HEMOGLOBIN AND HEMATOCRIT, BLOOD    EKG EKG Interpretation  Date/Time:  Wednesday August 17 2020 09:12:14 EST Ventricular Rate:  63 PR Interval:    QRS Duration: 159 QT Interval:  464 QTC Calculation: 475 R Axis:   -79 Text Interpretation: Afib/flutter and ventricular-paced rhythm No further analysis attempted due to paced rhythm Confirmed by Fredia Sorrow [phone]) on 08/17/2020 9:19:59 AM   Radiology CT Chest W Contrast  Result Date: 08/17/2020  CLINICAL DATA:  Abdominal distension. Suspicion of diverticulitis. Melena. Appendectomy and bladder surgery EXAM: CT CHEST, ABDOMEN, AND PELVIS WITH CONTRAST TECHNIQUE: Multidetector CT imaging of the chest, abdomen and pelvis was performed following the standard protocol during bolus administration of intravenous contrast. CONTRAST:  69mL OMNIPAQUE IOHEXOL 300 MG/ML  SOLN COMPARISON:  CT chest 07/06/2016 FINDINGS: CT CHEST FINDINGS Cardiovascular: Aortic valve repair. Pacemaker noted. Coronary artery calcification and aortic atherosclerotic calcification. Mediastinum/Nodes: No axillary supraclavicular adenopathy. No mediastinal or hilar adenopathy. No  pericardial fluid. Lungs/Pleura: Several foci of peribronchial thickening scattered throughout the LEFT and RIGHT lungs. For example in the RIGHT upper lobe on image 59/3. LEFT upper lobe image 47/3. Small RIGHT effusion. Small focus of consolidation at the RIGHT lung base representing atelectasis or potentially pneumonia (image 115/3). Musculoskeletal: No aggressive osseous lesion. CT ABDOMEN AND PELVIS FINDINGS Hepatobiliary: No focal hepatic lesion. No biliary ductal dilatation. Gallbladder is normal. Common bile duct is normal. Pancreas: Pancreas is normal. No ductal dilatation. No pancreatic inflammation. Spleen: Normal spleen Adrenals/urinary tract: Adrenal glands and kidneys normal. Multiple diverticula of the bladder. Stomach/Bowel: Stomach, duodenum,and small-bowel normal. Terminal ileum is normal. Within the ascending colon there is a 5.5 cm segment of circumferential bowel wall submucosal thickening (axial image 88/2 and coronal image 63/4). No evidence of obstruction proximal to this the circumferential submucosal thickening. The distal colon (transverse and descending colon) is normal. There is stool in the rectosigmoid colon. Rectum normal. Vascular/Lymphatic: Abdominal aorta normal caliber with intimal calcifications. Small retrocrural lymph node measuring 7 mm on image 50/2). No additional abdominal or mesenteric adenopathy. Reproductive: Uterus and adnexa unremarkable. Other: No peritoneal nodularity.  No free fluid. Musculoskeletal: Internal fixation of RIGHT hip fracture. No aggressive osseous lesion IMPRESSION: Chest Impression: 1. Scattered peribronchial thickening in LEFT and RIGHT lung suggest mild pulmonary inflammation or infection. 2. Focus of consolidation with pleural fluid at the RIGHT lung base differential include pneumonia or round atelectasis. Abdomen / Pelvis Impression: 1. Circumferential submucosal thickening over 6 cm segment of the ascending colon. 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 Recommend endoscopy for further evaluation. 2. No evidence of metastatic adenopathy other than a small retrocrural lymph node which is favored unrelated 3. No liver metastasis. 4. Multiple diverticula bladder. Electronically Signed   By: Suzy Bouchard M.D.   On: 08/17/2020 14:26   CT Abdomen Pelvis W Contrast  Result Date: 08/17/2020 CLINICAL DATA:  Abdominal distension. Suspicion of diverticulitis. Melena. Appendectomy and bladder surgery EXAM: CT CHEST, ABDOMEN, AND PELVIS WITH CONTRAST TECHNIQUE: Multidetector CT imaging of the chest, abdomen and pelvis was performed following the standard protocol during bolus administration of intravenous contrast. CONTRAST:  56mL OMNIPAQUE IOHEXOL 300 MG/ML  SOLN COMPARISON:  CT chest 07/06/2016 FINDINGS: CT CHEST FINDINGS Cardiovascular: Aortic valve repair. Pacemaker noted. Coronary artery calcification and aortic atherosclerotic calcification. Mediastinum/Nodes: No axillary supraclavicular adenopathy. No mediastinal or hilar adenopathy. No pericardial fluid. Lungs/Pleura: Several foci of peribronchial thickening scattered throughout the LEFT and RIGHT lungs. For example in the RIGHT upper lobe on image 59/3. LEFT upper lobe image 47/3. Small RIGHT effusion. Small focus of consolidation at the RIGHT lung base representing atelectasis or potentially pneumonia (image 115/3). Musculoskeletal: No aggressive osseous lesion. CT ABDOMEN AND PELVIS FINDINGS Hepatobiliary: No focal hepatic lesion. No biliary ductal dilatation. Gallbladder is normal. Common bile duct is normal. Pancreas: Pancreas is normal. No ductal dilatation. No pancreatic inflammation. Spleen: Normal spleen Adrenals/urinary tract: Adrenal glands and kidneys normal. Multiple diverticula of the bladder. Stomach/Bowel: Stomach, duodenum,and small-bowel normal.  Terminal ileum is normal. Within the ascending colon there is a 5.5 cm segment of circumferential bowel wall submucosal thickening  (axial image 88/2 and coronal image 63/4). No evidence of obstruction proximal to this the circumferential submucosal thickening. The distal colon (transverse and descending colon) is normal. There is stool in the rectosigmoid colon. Rectum normal. Vascular/Lymphatic: Abdominal aorta normal caliber with intimal calcifications. Small retrocrural lymph node measuring 7 mm on image 50/2). No additional abdominal or mesenteric adenopathy. Reproductive: Uterus and adnexa unremarkable. Other: No peritoneal nodularity.  No free fluid. Musculoskeletal: Internal fixation of RIGHT hip fracture. No aggressive osseous lesion IMPRESSION: Chest Impression: 1. Scattered peribronchial thickening in LEFT and RIGHT lung suggest mild pulmonary inflammation or infection. 2. Focus of consolidation with pleural fluid at the RIGHT lung base differential include pneumonia or round atelectasis. Abdomen / Pelvis Impression: 1. Circumferential submucosal thickening over 6 cm segment of the ascending colon. 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 Recommend endoscopy for further evaluation. 2. No evidence of metastatic adenopathy other than a small retrocrural lymph node which is favored unrelated 3. No liver metastasis. 4. Multiple diverticula bladder. Electronically Signed   By: Suzy Bouchard M.D.   On: 08/17/2020 14:26   DG Chest Port 1 View  Result Date: 08/17/2020 CLINICAL DATA:  Gastrointestinal bleeding. EXAM: PORTABLE CHEST 1 VIEW COMPARISON:  August 04, 2020. FINDINGS: Stable cardiomegaly. Status post transcatheter aortic valve repair. Stable position of left-sided pacemaker. No pneumothorax or pleural effusion is noted. Stable bilateral upper lobe opacities are noted concerning for scarring or possibly pneumonia. Bony thorax is unremarkable. IMPRESSION: Stable bilateral upper lobe opacities are noted concerning for scarring or possibly pneumonia. Stable cardiomegaly. Aortic Atherosclerosis (ICD10-I70.0). Electronically  Signed   By: Marijo Conception M.D.   On: 08/17/2020 10:08    Procedures Procedures   Medications Ordered in ED Medications  ceFEPIme (MAXIPIME) 1 g in sodium chloride 0.9 % 100 mL IVPB (has no administration in time range)  iohexol (OMNIPAQUE) 300 MG/ML solution 75 mL (75 mLs Intravenous Contrast Given 08/17/20 1251)    ED Course  I have reviewed the triage vital signs and the nursing notes.  Pertinent labs & imaging results that were available during my care of the patient were reviewed by me and considered in my medical decision making (see chart for details).    MDM Rules/Calculators/A&P                          Patient definitely with melena.  Concerns for GI bleed.  Initial hemoglobin 9.4.  Not requiring blood transfusion.  We will go ahead and check H&H again.  Patient is getting CT chest for concerns for upper lobe pneumonias.  And also I will go ahead and CT her abdomen because she has not had CT her abdomen despite the history of GI bleeds.   Patient hemodynamically stable.  We will review her CT results when they come back.  And will recheck her H&H.  With her being at [facility].  If the H&H is stable and the CTs did not show a reason for admission she may be able to go back to the [facility] and they can follow her hemoglobin hematocrits there.  CT findings raise concern for right base pneumonia pleural effusion.  Also there is evidence of a sending colonic mass.  Which may explain the melena.  Patient pneumonia would be healthcare acquired.  Antibiotics ordered.  Will discuss with hospitalist for admission.  Final Clinical Impression(s) / ED Diagnoses Final diagnoses:  Melena  Gastrointestinal hemorrhage, unspecified gastrointestinal hemorrhage type  Colonic mass  Healthcare-associated pneumonia    Rx / DC Orders ED Discharge Orders    None       Fredia Sorrow, MD 08/23/20 1655

## 2020-08-17 NOTE — Discharge Instructions (Signed)
1) please take Omnicef and doxycycline as prescribed for presumed pneumonia 2) please keep your appointment with gastroenterologist Dr. Jenetta Downer for Monday, 08/22/2020 at 10 AM--- to discuss finding of possible right-sided colon cancer within the ascending colon 3) please consider palliative care referral given multiple comorbid conditions including new finding of possible right sided/ascending colon ---colon cancer 4) please repeat CBC every Monday and Fridays starting on 08/19/2020, please consider transfusion if hemoglobin is below 7 or if patient is symptomatic 5) please return to pension to SNF for ongoing rehab due to concerns about clinical diagnosis of recent stroke with weakness and deconditioning

## 2020-08-17 NOTE — ED Triage Notes (Signed)
Per staff at [facility] pt had black tarry stools this AM.

## 2020-08-17 NOTE — Progress Notes (Signed)
Pharmacy Antibiotic Note  Crystal Cooper is a 87 y.o. female admitted on 08/17/2020 with pneumonia.  Pharmacy has been consulted for Vancomycin dosing.  Plan: Vancomycin 1250 mg IV x 1 dose. Vancomycin 750 mg IV every 48 hours. Expected AUC 458. Cefepime 2000 mg IV x 1 dose. F/U cefepime consult and continuation. Monitor labs, c/s, and vanco level as indicated.     Temp (24hrs), Avg:99.2 F (37.3 C), Min:99.2 F (37.3 C), Max:99.2 F (37.3 C)  Recent Labs  Lab 08/13/20 1215 08/13/20 1222 08/17/20 0918  WBC 7.5  --  7.7  CREATININE 1.28* 1.30* 1.16*    Estimated Creatinine Clearance: 23.8 mL/min (A) (by C-G formula based on SCr of 1.16 mg/dL (H)).    Allergies  Allergen Reactions   Hctz [Hydrochlorothiazide] Other (See Comments)    Pt was ill and this affected her kidneys    Aspirin Other (See Comments)    Cardiologist said the patient is to not take this   Codeine Other (See Comments)    Made the patient feel ill, has not had any problems since 1977    Antimicrobials this admission: Vanco 2/23 >>  Cefepime 2/23      Thank you for allowing pharmacy to be a part of this patients care.  Ramond Craver 08/17/2020 3:07 PM

## 2020-08-17 NOTE — ED Notes (Signed)
Pt gone to CT 

## 2020-08-17 NOTE — ED Notes (Signed)
Provider in to see pt. Assisted with fecal occult. Fecal occult positive.

## 2020-08-18 ENCOUNTER — Non-Acute Institutional Stay (SKILLED_NURSING_FACILITY): Payer: Medicare Other | Admitting: Internal Medicine

## 2020-08-18 ENCOUNTER — Encounter: Payer: Self-pay | Admitting: Internal Medicine

## 2020-08-18 DIAGNOSIS — F0280 Dementia in other diseases classified elsewhere without behavioral disturbance: Secondary | ICD-10-CM

## 2020-08-18 DIAGNOSIS — E440 Moderate protein-calorie malnutrition: Secondary | ICD-10-CM | POA: Diagnosis not present

## 2020-08-18 DIAGNOSIS — N1832 Chronic kidney disease, stage 3b: Secondary | ICD-10-CM | POA: Diagnosis not present

## 2020-08-18 DIAGNOSIS — G309 Alzheimer's disease, unspecified: Secondary | ICD-10-CM

## 2020-08-18 DIAGNOSIS — I480 Paroxysmal atrial fibrillation: Secondary | ICD-10-CM | POA: Diagnosis not present

## 2020-08-18 DIAGNOSIS — I639 Cerebral infarction, unspecified: Secondary | ICD-10-CM

## 2020-08-18 NOTE — Assessment & Plan Note (Addendum)
Med list reviewed; no nephrotoxic agents identified. Current creatinine 1.16 & GFR 45

## 2020-08-18 NOTE — Patient Instructions (Signed)
See assessment and plan under each diagnosis in the problem list and acutely for this visit 

## 2020-08-18 NOTE — Assessment & Plan Note (Signed)
She can provide no meaningful history and confabulates nonsensically.

## 2020-08-18 NOTE — Progress Notes (Signed)
NURSING HOME LOCATION: [place] ROOM NUMBER: [id number]   CODE STATUS: DNR   PCP:  [name], MD  This is a comprehensive admission note to [facility] performed on this date less than 30 days from date of admission. Included are preadmission medical/surgical history; reconciled medication list; family history; social history and comprehensive review of systems.  Corrections and additions to the records were documented. Comprehensive physical exam was also performed. Additionally a clinical summary was entered for each active diagnosis pertinent to this admission in the Problem List to enhance continuity of care.  HPI: She was hospitalized 2/19-2/22/2022 with acute L ischemic MCA. Clinically stroke like picture was manifest as leaning to the right,slow verbal responses, & inability to ambulate. Presentation exceeded the TPA window. Carotid Dopplers and echo revealed no significant findings.  MRI could not be completed as the patient is status post pacemaker implantation. Because of a past history of life-threatening GI bleed due to AVM patient was not a candidate for antiplatelet therapy or anticoagulation.  Neurology discussed this with family members who were in agreement. She has a history of PAF but EKG revealed sinus rhythm with well-controlled rate. Present at admission was a stage I sacral  pressure injury. She did not return to baseline ; aphasia, R sided weakness & poor coordination persisted.  She had no dysarthria or impaired swallowing capacity. Past medical and surgical history:includes CKD stage IIIb,PAF,GERD, dementia,GI bleed,chronic diastolic CHF, history of MI, and dementia. Surgeries and procedures include pacemaker insertion, cardiac radiofrequency ablation, bladder surgery, cardiac catheterization, cardioversion, left intertrochanteric nailing, and transfemoral AV replacement  Social history: Nondrinker, never smoked.  Family history: Extensive  history reviewed.  This is noncontributory due to advanced age.   Review of systems:  Could not be completed due to dementia.  She stated that she has been in the hospital for "different reasons" but could not elaborate.  She would confabulate nonsensically when asked a question.  When I asked if she were having medical issues her response was that she had "finished school, had to get it done".  She was outlining letters on the front page of a word search booklet . Letters were arranged vertically, horizontally, and at an angle.  She was outlining letters which failed to spell any definite word.   Physical exam:  Pertinent or positive findings: Hair is thin.  She is markedly hard of hearing.  Eyebrows are essentially absent.  Facies are blank.  Complete dentures are present.  Pacemaker is present subcutaneous over the left anterior chest.  Heart rhythm is slightly irregular with an increased first heart sound.  She has expiratory low-grade musical rhonchi.  Pedal pulses are decreased.  Limbs are atrophic.  She has bruising over the dorsum of the hands.  General appearance: no acute distress, increased work of breathing is present.   Lymphatic: No lymphadenopathy about the head, neck, axilla. Eyes: No conjunctival inflammation or lid edema is present. There is no scleral icterus. Ears:  External ear exam shows no significant lesions or deformities.   Nose:  External nasal examination shows no deformity or inflammation. Nasal mucosa are pink and moist without lesions, exudates Oral exam: Lips and gums are healthy appearing.There is no oropharyngeal erythema or exudate. Neck:  No thyromegaly, masses, tenderness noted.    Heart:  No gallop, murmur, click, rub.  Lungs:  without wheezes, rales, rubs. Abdomen: Bowel sounds are normal.  Abdomen is soft and nontender with no organomegaly, hernias, masses. GU: Deferred  Extremities:  No cyanosis, clubbing, edema. Neurologic exam:Balance, Rhomberg, finger to  nose testing could not be completed due to clinical state Skin: Warm & dry w/o tenting. No significant  rash.  See clinical summary under each active problem in the Problem List with associated updated therapeutic plan

## 2020-08-18 NOTE — Assessment & Plan Note (Signed)
Rhythm is slightly irregular, but rate is well controlled.

## 2020-08-21 DIAGNOSIS — E46 Unspecified protein-calorie malnutrition: Secondary | ICD-10-CM | POA: Insufficient documentation

## 2020-08-21 NOTE — Assessment & Plan Note (Signed)
Current albumin 2.7 & TP 6.0. Nutrition consult @ SNF

## 2020-08-21 NOTE — Assessment & Plan Note (Signed)
Pursue secondary prevention beyond anticoagulation

## 2020-08-22 ENCOUNTER — Encounter (INDEPENDENT_AMBULATORY_CARE_PROVIDER_SITE_OTHER): Payer: Self-pay | Admitting: Gastroenterology

## 2020-08-22 ENCOUNTER — Other Ambulatory Visit: Payer: Self-pay

## 2020-08-22 ENCOUNTER — Ambulatory Visit (INDEPENDENT_AMBULATORY_CARE_PROVIDER_SITE_OTHER): Payer: Medicare Other | Admitting: Gastroenterology

## 2020-08-22 VITALS — Ht 61.0 in

## 2020-08-22 DIAGNOSIS — K921 Melena: Secondary | ICD-10-CM

## 2020-08-22 DIAGNOSIS — C189 Malignant neoplasm of colon, unspecified: Secondary | ICD-10-CM | POA: Insufficient documentation

## 2020-08-22 NOTE — Patient Instructions (Signed)
Referral to palliative care Follow up with Dr. Delton Coombes

## 2020-08-22 NOTE — Progress Notes (Signed)
Maylon Peppers, M.D. Gastroenterology & Hepatology [facility] [address] [place], [place] [address]  Primary Care Physician: [name], MD [place] [id number]  I will communicate my assessment and recommendations to the referring MD via EMR.  Problems: 1. Colon cancer in ascending colon 2. History of gastric ulcer  History of Present Illness: CARITA SOLLARS is a 87 y.o. female with past medical history of supraventricular tachycardia status post ablation, recent stroke, history of duodenal AVMs, chronic renal disease stage III, history of COPD, GERD, dementia, who has an appointment for follow-up of recent diagnosis of colon cancer.    Both of her daughters came to the clinic today to discuss the next steps in treatment, the patient is not present.  The patient was admitted to the hospital on 08/17/2020 after presenting episodes of melena.  She was transferred from the [facility] rehab center (recovering from a left MCA stroke) after presenting with recurrent melena and a drop in her hemoglobin to 9.4.  Patient underwent a CT of the abdomen and pelvis with IV contrast same day in the ER which showed circumferential submucosal 6 cm segment of the ascending colon concerning for colorectal cancer without presence of any metastatic adenopathy.  Discussion was held with the hospitalist, the inpatient GI attending (Dr. Laural Golden) and the daughters, who decided the patient will follow in the clinic for possible transition to palliative care.  Past Medical History: Past Medical History:  Diagnosis Date  . Acute CVA (cerebrovascular accident) ([place]) 08/27/2019  . Acute GI bleeding 06/22/2020  . Acute renal failure superimposed on stage 3b chronic kidney disease ([place])   . AKI (acute kidney injury) ([place])   . Anemia    years ago  . Angio-edema, initial encounter 08/23/2019  . Aortic stenosis   . Arthritis   . Asthma   .  Atrial fibrillation ([place])   . Blood transfusion without reported diagnosis   . CHF (congestive heart failure) ([place]) 11/2014  . CKD (chronic kidney disease)    had many uti this year   . Closed left hip fracture, initial encounter ([place]) 03/09/2020  . Dementia ([place])   . Family history of adverse reaction to anesthesia    2 daughters would have N/V  . Gastric AVM   . GERD (gastroesophageal reflux disease)    was seen by Gi for bleeding ulcer which was cauterized   . GI bleed   . Glaucoma   . Hearing loss   . Heart murmur    Rheumatic fever whe she was young,  sees Dr. Stanford Breed in [place]  . HTN (hypertension)   . Hypokalemia   . Myocardial infarction ([place])    in her 78 when she had two heart attacks   . PNA (pneumonia) 08/25/2019  . Pneumonia    stayed from friday to sunday in august  . Presence of permanent cardiac pacemaker    placed about 4 years ago  . Prolapsing mitral leaflet syndrome   . Shortness of breath dyspnea    with exertion  . Stroke ([place])   . SVT (supraventricular tachycardia) ([facility])    S/P ablation of AVNRT in 2003    Past Surgical History: Past Surgical History:  Procedure Laterality Date  . APPENDECTOMY    . BIOPSY  04/07/2018   Procedure: BIOPSY;  Surgeon: Jerene Bears, MD;  Location: [place];  Service: Gastroenterology;;  . BIOPSY  02/19/2020   Procedure: BIOPSY;  Surgeon:  Harvel Quale, MD;  [facility];  Service: Gastroenterology;;  gastric   . BLADDER SURGERY    . CARDIAC CATHETERIZATION N/A 09/26/2015   Procedure: Right/Left Heart Cath and Coronary Angiography;  Surgeon: Burnell Blanks, MD;  Location: [place] [facility];  Service: Cardiovascular;  Laterality: N/A;  . CARDIAC SURGERY    . CARDIOVERSION N/A 03/02/2016   Procedure: CARDIOVERSION;  Surgeon: Thayer Headings, MD;  Location: [facility] ENDOSCOPY;  Service: Cardiovascular;  Laterality: N/A;  . ENTEROSCOPY  02/19/2020   Procedure: ENTEROSCOPY;  Surgeon: Harvel Quale, MD;  [facility];  Service: Gastroenterology;;  . EP IMPLANTABLE DEVICE N/A 10/26/2015   Procedure: Pacemaker Implant;  Surgeon: Will Meredith Leeds, MD;  Location: [place] [facility];  Service: Cardiovascular;  Laterality: N/A;  . ESOPHAGOGASTRODUODENOSCOPY (EGD) WITH PROPOFOL N/A 04/07/2018   Procedure: ESOPHAGOGASTRODUODENOSCOPY (EGD) WITH PROPOFOL;  Surgeon: Jerene Bears, MD;  Location: [facility] ENDOSCOPY;  Service: Gastroenterology;  Laterality: N/A;  . ESOPHAGOGASTRODUODENOSCOPY (EGD) WITH PROPOFOL N/A 10/12/2019   Procedure: ESOPHAGOGASTRODUODENOSCOPY (EGD) WITH PROPOFOL;  Surgeon: Danie Binder, MD;  [facility];  Service: Endoscopy;  Laterality: N/A;  . EYE SURGERY Bilateral    cataract surgery  . FLEXIBLE SIGMOIDOSCOPY  02/19/2020   Procedure: FLEXIBLE SIGMOIDOSCOPY;  Surgeon: Harvel Quale, MD;  [facility];  Service: Gastroenterology;;  . HIP FRACTURE SURGERY Left 02/2020  . HOT HEMOSTASIS N/A 04/07/2018   Procedure: HOT HEMOSTASIS (ARGON PLASMA COAGULATION/BICAP);  Surgeon: Jerene Bears, MD;  Location: [facility] ENDOSCOPY;  Service: Gastroenterology;  Laterality: N/A;  . HOT HEMOSTASIS  10/12/2019   Procedure: HOT HEMOSTASIS (ARGON PLASMA COAGULATION/BICAP);  Surgeon: Danie Binder, MD;  [facility];  Service: Endoscopy;;  . INTRAMEDULLARY (IM) NAIL INTERTROCHANTERIC Left 03/10/2020   Procedure: OPEN TREATMENT INTERNAL FIXATION LEFT HIP (WITH GAMMA NAIL);  Surgeon: Carole Civil, MD;  Location: [facility];  Service: Orthopedics;  Laterality: Left;  . NASAL SINUS SURGERY    . PACEMAKER INSERTION    . TEE WITHOUT CARDIOVERSION N/A 10/25/2015   Procedure: TRANSESOPHAGEAL ECHOCARDIOGRAM (TEE);  Surgeon: Burnell Blanks, MD;  Location: [place];  Service: Open Heart Surgery;  Laterality: N/A;  . TRANSCATHETER AORTIC VALVE REPLACEMENT, TRANSFEMORAL Right 10/25/2015   Procedure: TRANSCATHETER AORTIC VALVE REPLACEMENT,  TRANSFEMORAL;  Surgeon: Burnell Blanks, MD;  Location: [place];  Service: Open Heart Surgery;  Laterality: Right;    Family History: Family History  Problem Relation Age of Onset  . Hypertension Mother   . Pneumonia Father   . Stomach cancer Brother   . Stroke Brother   . AAA (abdominal aortic aneurysm) Brother   . Cervical cancer Daughter   . Heart attack Neg Hx   . Colon cancer Neg Hx   . Esophageal cancer Neg Hx     Social History: Social History   Tobacco Use  Smoking Status Never Smoker  Smokeless Tobacco Never Used   Social History   Substance and Sexual Activity  Alcohol Use No  . Alcohol/week: 0.0 standard drinks   Social History   Substance and Sexual Activity  Drug Use No    Allergies: Allergies  Allergen Reactions  . Hctz [Hydrochlorothiazide] Other (See Comments)    Pt was ill and this affected her kidneys   . Aspirin Other (See Comments)    Cardiologist said the patient is to not take this  . Codeine Other (See Comments)    Made the patient feel  ill, has not had any problems since 1977    Medications: Current Outpatient Medications  Medication Sig Dispense Refill  . acetaminophen (TYLENOL) 325 MG tablet Take 2 tablets (650 mg total) by mouth every 4 (four) hours as needed for mild pain (or temp > 37.5 C (99.5 F)).    [name] albuterol (PROAIR HFA) 108 (90 Base) MCG/ACT inhaler Inhale 2 puffs into the lungs every 4 (four) hours as needed for wheezing or shortness of breath. 18 g 11  . Balsam [place]-Castor Oil OINT Apply 1 application topically daily. Apply to sacrum and bilateral buttocks every shift    . Biotin 10 MG CAPS Take 10 mg by mouth daily.     . Budeson-Glycopyrrol-Formoterol (BREZTRI AEROSPHERE) 160-9-4.8 MCG/ACT AERO Inhale 2 Inhalers into the lungs in the morning and at bedtime. 5.9 g 1  . Calcium Carb-Cholecalciferol (CALTRATE 600+D3) 600-800 MG-UNIT TABS Take 1 tablet by mouth daily.    . cefdinir (OMNICEF) 300 MG capsule Take 1  capsule (300 mg total) by mouth daily for 5 days. 5 capsule 0  . docusate sodium (COLACE) 100 MG capsule Take 1 capsule (100 mg total) by mouth 2 (two) times daily.    [name] doxycycline (VIBRA-TABS) 100 MG tablet Take 1 tablet (100 mg total) by mouth 2 (two) times daily for 5 days. 10 tablet 0  . EUTHYROX 25 MCG tablet Take 1 tablet by mouth once daily 30 tablet 7  . famotidine (PEPCID) 20 MG tablet Take 1 tablet (20 mg total) by mouth at bedtime. 90 tablet 1  . folic acid (FOLVITE) 1 MG tablet Take 1 tablet (1 mg total) by mouth daily. 30 tablet 6  . furosemide (LASIX) 40 MG tablet Take 1 tablet (40 mg total) by mouth daily.    [name] guaiFENesin (MUCINEX) 600 MG 12 hr tablet Take 1 tablet (600 mg total) by mouth 2 (two) times daily for 10 days. 20 tablet 0  . iron polysaccharides (NIFEREX) 150 MG capsule Take 1 capsule (150 mg total) by mouth daily. 30 capsule 3  . levalbuterol (XOPENEX) 1.25 MG/3ML nebulizer solution Take 1.25 mg by nebulization every 4 (four) hours as needed for wheezing. Dx J45.909 72 mL 12  . memantine (NAMENDA) 10 MG tablet Take 1 tablet (10 mg total) by mouth 2 (two) times daily. 180 tablet 3  . metoprolol tartrate (LOPRESSOR) 25 MG tablet Take 1 tablet (25 mg total) by mouth 2 (two) times daily. 180 tablet 3  . NON FORMULARY Diet NAS    . pantoprazole (PROTONIX) 40 MG tablet Take 1 tablet by mouth twice daily 60 tablet 2  . potassium chloride (KLOR-CON) 10 MEQ tablet Take 2 tablets (20 mEq total) by mouth daily. 60 tablet 2   No current facility-administered medications for this visit.    Review of Systems: GENERAL: negative for malaise, night sweats HEENT: No changes in hearing or vision, no nose bleeds or other nasal problems. NECK: Negative for lumps, goiter, pain and significant neck swelling RESPIRATORY: Negative for cough, wheezing CARDIOVASCULAR: Negative for chest pain, leg swelling, palpitations, orthopnea GI: SEE HPI MUSCULOSKELETAL: Negative for joint pain or  swelling, back pain, and muscle pain. SKIN: Negative for lesions, rash PSYCH: Negative for sleep disturbance, mood disorder and recent psychosocial stressors. HEMATOLOGY Negative for prolonged bleeding, bruising easily, and swollen nodes. ENDOCRINE: Negative for cold or heat intolerance, polyuria, polydipsia and goiter. NEURO: negative for tremor, gait imbalance, syncope and seizures. The remainder of the review of systems is noncontributory.   Physical  Exam: Patient not present during the encounter. Daughters sitting in the room.  Imaging/Labs: as above  I personally reviewed and interpreted the available labs, imaging and endoscopic files.  Impression and Plan: LADIAMOND GALLINA is a 87 y.o. female with past medical history of supraventricular tachycardia status post ablation, recent stroke, history of duodenal AVMs, chronic renal disease stage III, history of COPD, GERD, dementia, who has an appointment for follow-up of recent diagnosis of colon cancer.   I had a lengthy conversation with the daughter for close to 30 minutes regarding the clinical presentation of their mother and the imaging findings which were highly suggestive of colon cancer in the ascending colon.  This point advised him there was no evidence of metastatic disease but the options for her more were very limited as she has advanced age but also she has multiple comorbidities with a recent stroke which make her a poor surgical candidate.  I extensively discussed with them that realistically I do not expect her lifespan to be prolonged given the multiple severe disease that she has.  I advised him that the best will be for her to pursue options to improve her symptoms if she were to develop any episodes of obstruction or gastrointestinal bleeding that will make her more comfortable instead of an aggressive treatment such as surgery which may lead to more suffering.  Both daughters agreed and understood this.  I will reach Dr.  Delton Coombes who is her hematologist to discuss if there is any role for palliative chemotherapy at this point.  More importantly, will refer her to palliative care for comprehensive assessment and goals of care discussion.  - Referral to palliative care - Follow up with Dr. Delton Coombes  All questions were answered.      Harvel Quale, MD Gastroenterology and Hepatology [facility]

## 2020-08-26 ENCOUNTER — Non-Acute Institutional Stay (SKILLED_NURSING_FACILITY): Payer: Medicare Other | Admitting: Adult Health

## 2020-08-26 ENCOUNTER — Encounter: Payer: Self-pay | Admitting: Adult Health

## 2020-08-26 DIAGNOSIS — I480 Paroxysmal atrial fibrillation: Secondary | ICD-10-CM | POA: Diagnosis not present

## 2020-08-26 DIAGNOSIS — G309 Alzheimer's disease, unspecified: Secondary | ICD-10-CM | POA: Diagnosis not present

## 2020-08-26 DIAGNOSIS — I10 Essential (primary) hypertension: Secondary | ICD-10-CM | POA: Diagnosis not present

## 2020-08-26 DIAGNOSIS — I700 Atherosclerosis of aorta: Secondary | ICD-10-CM | POA: Diagnosis not present

## 2020-08-26 DIAGNOSIS — F0280 Dementia in other diseases classified elsewhere without behavioral disturbance: Secondary | ICD-10-CM

## 2020-08-26 NOTE — Progress Notes (Signed)
Location:  [facility] Room Number: 353 Place of Service:  SNF (31)   CODE STATUS: dnr  Allergies  Allergen Reactions  . Hctz [Hydrochlorothiazide] Other (See Comments)    Pt was ill and this affected her kidneys   . Aspirin Other (See Comments)    Cardiologist said the patient is to not take this  . Codeine Other (See Comments)    Made the patient feel ill, has not had any problems since 1977    Chief Complaint  Patient presents with  . Acute Visit    Behaviors     HPI:  The staff and her family are concerned about her behaviors. She is restless; can be angry at times. She is sleeping this morning. She does get out of bed daily; is out at the nurses station.   Past Medical History:  Diagnosis Date  . Acute CVA (cerebrovascular accident) ([place]) 08/27/2019  . Acute GI bleeding 06/22/2020  . Acute renal failure superimposed on stage 3b chronic kidney disease ([place])   . AKI (acute kidney injury) ([place])   . Anemia    years ago  . Angio-edema, initial encounter 08/23/2019  . Aortic stenosis   . Arthritis   . Asthma   . Atrial fibrillation ([place])   . Blood transfusion without reported diagnosis   . CHF (congestive heart failure) ([place]) 11/2014  . CKD (chronic kidney disease)    had many uti this year   . Closed left hip fracture, initial encounter ([place]) 03/09/2020  . Dementia ([place])   . Family history of adverse reaction to anesthesia    2 daughters would have N/V  . Gastric AVM   . GERD (gastroesophageal reflux disease)    was seen by Gi for bleeding ulcer which was cauterized   . GI bleed   . Glaucoma   . Hearing loss   . Heart murmur    Rheumatic fever whe she was young,  sees Dr. Stanford Breed in [place]  . HTN (hypertension)   . Hypokalemia   . Myocardial infarction ([place])    in her 46 when she had two heart attacks   . PNA (pneumonia) 08/25/2019  . Pneumonia    stayed from friday to sunday in august  . Presence of permanent cardiac pacemaker     placed about 4 years ago  . Prolapsing mitral leaflet syndrome   . Shortness of breath dyspnea    with exertion  . Stroke ([place])   . SVT (supraventricular tachycardia) ([facility])    S/P ablation of AVNRT in 2003    Past Surgical History:  Procedure Laterality Date  . APPENDECTOMY    . BIOPSY  04/07/2018   Procedure: BIOPSY;  Surgeon: Jerene Bears, MD;  Location: [facility] ENDOSCOPY;  Service: Gastroenterology;;  . BIOPSY  02/19/2020   Procedure: BIOPSY;  Surgeon: Harvel Quale, MD;  Location: [facility] END[facility] SU[facility];  Service: Gastroenterology;;  gastric   . BLADDER SURGERY    . CARDIAC CATHETERIZATION N/A 09/26/2015   Procedure: Right/Left Heart Cath and Coronary Angiography;  Surgeon: Burnell Blanks, MD;  Location: [facility] CV LAB;  Service: Cardiovascular;  Laterality: N/A;  . CARDIAC SURGERY    . CARDIOVERSION N/A 03/02/2016   Procedure: CARDIOVERSION;  Surgeon: Thayer Headings, MD;  Location: [facility];  Service: Cardiovascular;  Laterality: N/A;  . ENTEROSCOPY  02/19/2020   Procedure: ENTEROSCOPY;  Surgeon: Harvel Quale, MD;  Location: [facility] ENDO SU[facility];  Service: Gastroenterology;;  . EP  IMPLANTABLE DEVICE N/A 10/26/2015   Procedure: Pacemaker Implant;  Surgeon: Will Meredith Leeds, MD;  Location: [facility] CV LAB;  Service: Cardiovascular;  Laterality: N/A;  . ESOPHAGOGASTRODUODENOSCOPY (EGD) WITH PROPOFOL N/A 04/07/2018   Procedure: ESOPHAGOGASTRODUODENOSCOPY (EGD) WITH PROPOFOL;  Surgeon: Jerene Bears, MD;  Location: [facility]OSCOPY;  Service: Gastroenterology;  Laterality: N/A;  . ESOPHAGOGASTRODUODENOSCOPY (EGD) WITH PROPOFOL N/A 10/12/2019   Procedure: ESOPHAGOGASTRODUODENOSCOPY (EGD) WITH PROPOFOL;  Surgeon: Danie Binder, MD;  [facility] END[facility];  Service: Endoscopy;  Laterality: N/A;  . EYE SURGERY Bilateral    cataract surgery  . FLEXIBLE SIGMOIDOSCOPY  02/19/2020   Procedure: FLEXIBLE SIGMOIDOSCOPY;  Surgeon: Harvel Quale, MD;  [facility]:  [facility];  Service: Gastroenterology;;  . HIP FRACTURE SURGERY Left 02/2020  . HOT HEMOSTASIS N/A 04/07/2018   Procedure: HOT HEMOSTASIS (ARGON PLASMA COAGULATION/BICAP);  Surgeon: Jerene Bears, MD;  Location: [facility] ENDOSCOPY;  Service: Gastroenterology;  Laterality: N/A;  . HOT HEMOSTASIS  10/12/2019   Procedure: HOT HEMOSTASIS (ARGON PLASMA COAGULATION/BICAP);  Surgeon: Danie Binder, MD;  [facility];  Service: Endoscopy;;  . INTRAMEDULLARY (IM) NAIL INTERTROCHANTERIC Left 03/10/2020   Procedure: OPEN TREATMENT INTERNAL FIXATION LEFT HIP (WITH GAMMA NAIL);  Surgeon: Carole Civil, MD;  Location: [facility] ORS;  Service: Orthopedics;  Laterality: Left;  . NASAL SINUS SURGERY    . PACEMAKER INSERTION    . TEE WITHOUT CARDIOVERSION N/A 10/25/2015   Procedure: TRANSESOPHAGEAL ECHOCARDIOGRAM (TEE);  Surgeon: Burnell Blanks, MD;  Location: [facility];  Service: Open Heart Surgery;  Laterality: N/A;  . TRANSCATHETER AORTIC VALVE REPLACEMENT, TRANSFEMORAL Right 10/25/2015   Procedure: TRANSCATHETER AORTIC VALVE REPLACEMENT, TRANSFEMORAL;  Surgeon: Burnell Blanks, MD;  Location: [facility];  Service: Open Heart Surgery;  Laterality: Right;    Social History   Socioeconomic History  . Marital status: Widowed    Spouse name: Not on file  . Number of children: 3  . Years of education: Not on file  . Highest education level: 12th grade  Occupational History  . Occupation: Retired  Tobacco Use  . Smoking status: Never Smoker  . Smokeless tobacco: Never Used  Vaping Use  . Vaping Use: Never used  Substance and Sexual Activity  . Alcohol use: No    Alcohol/week: 0.0 standard drinks  . Drug use: No  . Sexual activity: Not Currently  Other Topics Concern  . Not on file  Social History Narrative  . Not on file   Social Determinants of Health   Financial Resource Strain: Low Risk   . Difficulty of Paying Living Expenses: Not hard at all  Food Insecurity: No Food Insecurity   . Worried About Charity fundraiser in the Last Year: Never true  . Ran Out of Food in the Last Year: Never true  Transportation Needs: Not on file  Physical Activity: Inactive  . Days of Exercise per Week: 0 days  . Minutes of Exercise per Session: 0 min  Stress: No Stress Concern Present  . Feeling of Stress : Not at all  Social Connections: Moderately Integrated  . Frequency of Communication with Friends and Family: More than three times a week  . Frequency of Social Gatherings with Friends and Family: More than three times a week  . Attends Religious Services: More than 4 times per year  . Active Member of Clubs or Organizations: Yes  . Attends Archivist Meetings: More than 4 times per year  . Marital Status:  Widowed  Intimate Partner Violence: Not At Risk  . Fear of Current or Ex-Partner: No  . Emotionally Abused: No  . Physically Abused: No  . Sexually Abused: No   Family History  Problem Relation Age of Onset  . Hypertension Mother   . Pneumonia Father   . Stomach cancer Brother   . Stroke Brother   . AAA (abdominal aortic aneurysm) Brother   . Cervical cancer Daughter   . Heart attack Neg Hx   . Colon cancer Neg Hx   . Esophageal cancer Neg Hx       VITAL SIGNS BP (!) 90/40   Pulse 64   Temp (!) 97.5 F (36.4 C)   Resp 20   Ht 5\' 1"  (1.549 m)   Wt 104 lb 3.2 oz (47.3 kg)   BMI 19.69 kg/m   Outpatient Encounter Medications as of 08/26/2020  Medication Sig  . acetaminophen (TYLENOL) 325 MG tablet Take 2 tablets (650 mg total) by mouth every 4 (four) hours as needed for mild pain (or temp > 37.5 C (99.5 F)).  [name] albuterol (PROAIR HFA) 108 (90 Base) MCG/ACT inhaler Inhale 2 puffs into the lungs every 4 (four) hours as needed for wheezing or shortness of breath.  Roseanne Kaufman [place]-Castor Oil OINT Apply 1 application topically daily. Apply to sacrum and bilateral buttocks every shift  . Biotin 10 MG CAPS Take 10 mg by mouth daily.   .  Budeson-Glycopyrrol-Formoterol (BREZTRI AEROSPHERE) 160-9-4.8 MCG/ACT AERO Inhale 2 Inhalers into the lungs in the morning and at bedtime.  . Calcium Carb-Cholecalciferol (CALTRATE 600+D3) 600-800 MG-UNIT TABS Take 1 tablet by mouth daily.  [name] docusate sodium (COLACE) 100 MG capsule Take 1 capsule (100 mg total) by mouth 2 (two) times daily.  [name] 25 MCG tablet Take 1 tablet by mouth once daily  . famotidine (PEPCID) 20 MG tablet Take 1 tablet (20 mg total) by mouth at bedtime.  . folic acid (FOLVITE) 1 MG tablet Take 1 tablet (1 mg total) by mouth daily.  . furosemide (LASIX) 40 MG tablet Take 1 tablet (40 mg total) by mouth daily.  [name] guaiFENesin (MUCINEX) 600 MG 12 hr tablet Take 1 tablet (600 mg total) by mouth 2 (two) times daily for 10 days.  . iron polysaccharides (NIFEREX) 150 MG capsule Take 1 capsule (150 mg total) by mouth daily.  [name] levalbuterol (XOPENEX) 1.25 MG/3ML nebulizer solution Take 1.25 mg by nebulization every 4 (four) hours as needed for wheezing. Dx J45.909  . memantine (NAMENDA) 10 MG tablet Take 1 tablet (10 mg total) by mouth 2 (two) times daily.  . metoprolol tartrate (LOPRESSOR) 25 MG tablet Take 1 tablet (25 mg total) by mouth 2 (two) times daily.  . NON FORMULARY Diet NAS  . pantoprazole (PROTONIX) 40 MG tablet Take 1 tablet by mouth twice daily  . potassium chloride (KLOR-CON) 10 MEQ tablet Take 2 tablets (20 mEq total) by mouth daily.   No facility-administered encounter medications on file as of 08/26/2020.     SIGNIFICANT DIAGNOSTIC EXAMS  TODAY  08-13-20: ct head:  No acute abnormality. Atrophy and chronic microvascular ischemic disease.  08-13-20: ct angio of head and neck:  1. 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 2. 26 mL ischemic penumbra in the left MCA territory without evidence of a core infarct. 3. Mild-to-moderate cervical carotid atherosclerosis without significant stenosis. 4. Aortic Atherosclerosis   08-14-20: carotid  doppler: Color duplex indicates minimal heterogeneous and calcified plaque, with no hemodynamically  significant stenosis by duplex criteria in the extracranial cerebrovascular circulation.  08-17-20: ct of chest; abdomen and pelvis Chest Impression: 1. Scattered peribronchial thickening in LEFT and RIGHT lung suggest mild pulmonary inflammation or infection. 2. Focus of consolidation with pleural fluid at the RIGHT lung base differential include pneumonia or round atelectasis.   Abdomen / Pelvis Impression: 1. Circumferential submucosal thickening over 6 cm segment of the ascending colon. 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 Recommend endoscopy for further evaluation. 2. No evidence of metastatic adenopathy other than a small retrocrural lymph node which is favored unrelated 3. No liver metastasis. 4. Multiple diverticula bladder.  LABS REVIEWED TODAY  08-13-20; wbc 7.5; hgb 9.4; hct 31.8; mcv 95.8 plt 270; glucose 122; bun 24; creat 1.28; k+ 3.9; na++ 135; ca 8.7 liver normal albumin 2.9 08-14-20; hgb a1c 4.7; chol 134; ldl 66; trig 96; hdl 49 08-17-20: wbc 7.7; hgb 9.4; hct 30.4; mcv 94.4 plt 242; glucose 111; bun 17; creat 1.16; k+ 3.7; na++ 136; ca 8.9 liver normal albumin 2.7    Review of Systems  Unable to perform ROS: Dementia (uanble to participate )   Physical Exam Constitutional:      General: She is not in acute distress.    Appearance: She is well-developed and well-nourished. She is not diaphoretic.  Neck:     Thyroid: No thyromegaly.  Cardiovascular:     Rate and Rhythm: Normal rate and regular rhythm.     Pulses: Normal pulses and intact distal pulses.     Heart sounds: Normal heart sounds.  Pulmonary:     Effort: Pulmonary effort is normal. No respiratory distress.     Breath sounds: Normal breath sounds.  Abdominal:     General: Bowel sounds are normal. There is no distension.     Palpations: Abdomen is soft.     Tenderness: There is no abdominal  tenderness.  Musculoskeletal:        General: No edema. Normal range of motion.     Cervical back: Neck supple.     Right lower leg: No edema.     Left lower leg: No edema.  Lymphadenopathy:     Cervical: No cervical adenopathy.  Skin:    General: Skin is warm and dry.  Neurological:     Mental Status: She is alert. Mental status is at baseline.  Psychiatric:        Mood and Affect: Mood and affect and mood normal.       ASSESSMENT/ PLAN:  TODAY  1. Alzheimer's dementia without behavioral disturbance unspecified timing of dementia onset 2. PAF(paroxsymal atrial fibrillation) 3. Essential hypertension 4. Aortic atherosclerosis   1. Will lower lopressor to 12.5 mg twice daily due to her bradycardia; and soft blood pressure readings 2. Will begin buspar 5 mg twice daily to manage her anxiety.  Will monitor her status.    Ok Edwards NP [facility]  Contact [phone] Monday through Friday 8am- 5pm  After hours call [phone]

## 2020-08-31 ENCOUNTER — Non-Acute Institutional Stay (SKILLED_NURSING_FACILITY): Payer: Medicare Other | Admitting: Adult Health

## 2020-08-31 ENCOUNTER — Encounter: Payer: Self-pay | Admitting: Adult Health

## 2020-08-31 DIAGNOSIS — I639 Cerebral infarction, unspecified: Secondary | ICD-10-CM | POA: Diagnosis not present

## 2020-08-31 DIAGNOSIS — G309 Alzheimer's disease, unspecified: Secondary | ICD-10-CM

## 2020-08-31 DIAGNOSIS — I700 Atherosclerosis of aorta: Secondary | ICD-10-CM

## 2020-08-31 DIAGNOSIS — F0280 Dementia in other diseases classified elsewhere without behavioral disturbance: Secondary | ICD-10-CM

## 2020-08-31 NOTE — Progress Notes (Signed)
Location:  [place] Room Number: 409 Place of Service:  SNF (31)   CODE STATUS: dnr  Allergies  Allergen Reactions  . Hctz [Hydrochlorothiazide] Other (See Comments)    Pt was ill and this affected her kidneys   . Aspirin Other (See Comments)    Cardiologist said the patient is to not take this  . Codeine Other (See Comments)    Made the patient feel ill, has not had any problems since 1977    Chief Complaint  Patient presents with  . Acute Visit    Care plan meeting.     HPI:  We have come together for her care plan meeting: no BIMS: mood 0/30. Family present   She is extensive to dependent for her adls. She requires extensive assist with eating with staff; will feed self with family present. . She is incontinent of bladder and bowel. She can be combative with staff during nursing care. She has had a fall on 08-19-20 without injury.  Weight is 104.2 pounds  admission weight is 107 pounds  appetite is 1-50%.   Therapy she does participate; no significant gains; max assist with toileting lower body dressing; mod assist with upper body; has ambulated 10 feet with minimal assist with  a walker. She is variable with transfer assistance. Will participate in activities at times.  There are no reports of pain present. She continues to be followed for her chronic illnesses including: Alzheimer's disease unspecified age onset  Aortic atherosclerosis Cerebrovascular accident (CVA) unspecified mechanism  Past Medical History:  Diagnosis Date  . Acute CVA (cerebrovascular accident) ([place]) 08/27/2019  . Acute GI bleeding 06/22/2020  . Acute renal failure superimposed on stage 3b chronic kidney disease ([place])   . AKI (acute kidney injury) ([place])   . Anemia    years ago  . Angio-edema, initial encounter 08/23/2019  . Aortic stenosis   . Arthritis   . Asthma   . Atrial fibrillation ([place])   . Blood transfusion without reported diagnosis   . CHF (congestive heart failure)  ([place]) 11/2014  . CKD (chronic kidney disease)    had many uti this year   . Closed left hip fracture, initial encounter ([place]) 03/09/2020  . Dementia ([place])   . Family history of adverse reaction to anesthesia    2 daughters would have N/V  . Gastric AVM   . GERD (gastroesophageal reflux disease)    was seen by Gi for bleeding ulcer which was cauterized   . GI bleed   . Glaucoma   . Hearing loss   . Heart murmur    Rheumatic fever whe she was young,  sees Dr. Stanford Breed in [place]  . HTN (hypertension)   . Hypokalemia   . Myocardial infarction ([place])    in her 79 when she had two heart attacks   . PNA (pneumonia) 08/25/2019  . Pneumonia    stayed from friday to sunday in august  . Presence of permanent cardiac pacemaker    placed about 4 years ago  . Prolapsing mitral leaflet syndrome   . Shortness of breath dyspnea    with exertion  . Stroke ([place])   . SVT (supraventricular tachycardia) ([facility])    S/P ablation of AVNRT in 2003    Past Surgical History:  Procedure Laterality Date  . APPENDECTOMY    . BIOPSY  04/07/2018   Procedure: BIOPSY;  Surgeon: Jerene Bears, MD;  Location: [facility] ENDOSCOPY;  Service: Gastroenterology;;  .  BIOPSY  02/19/2020   Procedure: BIOPSY;  Surgeon: Harvel Quale, MD;  [facility] ENDO SU[facility];  Service: Gastroenterology;;  gastric   . BLADDER SURGERY    . CARDIAC CATHETERIZATION N/A 09/26/2015   Procedure: Right/Left Heart Cath and Coronary Angiography;  Surgeon: Burnell Blanks, MD;  [facility];  Service: Cardiovascular;  Laterality: N/A;  . CARDIAC SURGERY    . CARDIOVERSION N/A 03/02/2016   Procedure: CARDIOVERSION;  Surgeon: Thayer Headings, MD;  Location: [facility] ENDOSCOPY;  Service: Cardiovascular;  Laterality: N/A;  . ENTEROSCOPY  02/19/2020   Procedure: ENTEROSCOPY;  Surgeon: Harvel Quale, MD;  [facility];  Service: Gastroenterology;;  . EP IMPLANTABLE DEVICE N/A 10/26/2015   Procedure:  Pacemaker Implant;  Surgeon: Will Meredith Leeds, MD;  Location: [place] CV LAB;  Service: Cardiovascular;  Laterality: N/A;  . ESOPHAGOGASTRODUODENOSCOPY (EGD) WITH PROPOFOL N/A 04/07/2018   Procedure: ESOPHAGOGASTRODUODENOSCOPY (EGD) WITH PROPOFOL;  Surgeon: Jerene Bears, MD;  Location[facility] ENDOSCOPY;  Service: Gastroenterology;  Laterality: N/A;  . ESOPHAGOGASTRODUODENOSCOPY (EGD) WITH PROPOFOL N/A 10/12/2019   Procedure: ESOPHAGOGASTRODUODENOSCOPY (EGD) WITH PROPOFOL;  Surgeon: Danie Binder, MD;  [facility];  Service: Endoscopy;  Laterality: N/A;  . EYE SURGERY Bilateral    cataract surgery  . FLEXIBLE SIGMOIDOSCOPY  02/19/2020   Procedure: FLEXIBLE SIGMOIDOSCOPY;  Surgeon: Harvel Quale, MD;  [facility];  Service: Gastroenterology;;  . HIP FRACTURE SURGERY Left 02/2020  . HOT HEMOSTASIS N/A 04/07/2018   Procedure: HOT HEMOSTASIS (ARGON PLASMA COAGULATION/BICAP);  Surgeon: Jerene Bears, MD;  Location: [facility] ENDOSCOPY;  Service: Gastroenterology;  Laterality: N/A;  . HOT HEMOSTASIS  10/12/2019   Procedure: HOT HEMOSTASIS (ARGON PLASMA COAGULATION/BICAP);  Surgeon: Danie Binder, MD;  [facility];  Service: Endoscopy;;  . INTRAMEDULLARY (IM) NAIL INTERTROCHANTERIC Left 03/10/2020   Procedure: OPEN TREATMENT INTERNAL FIXATION LEFT HIP (WITH GAMMA NAIL);  Surgeon: Carole Civil, MD;  Location: [facility] ORS;  Service: Orthopedics;  Laterality: Left;  . NASAL SINUS SURGERY    . PACEMAKER INSERTION    . TEE WITHOUT CARDIOVERSION N/A 10/25/2015   Procedure: TRANSESOPHAGEAL ECHOCARDIOGRAM (TEE);  Surgeon: Burnell Blanks, MD;  Location: [place];  Service: Open Heart Surgery;  Laterality: N/A;  . TRANSCATHETER AORTIC VALVE REPLACEMENT, TRANSFEMORAL Right 10/25/2015   Procedure: TRANSCATHETER AORTIC VALVE REPLACEMENT, TRANSFEMORAL;  Surgeon: Burnell Blanks, MD;  Location: [place];  Service: Open Heart Surgery;  Laterality: Right;    Social  History   Socioeconomic History  . Marital status: Widowed    Spouse name: Not on file  . Number of children: 3  . Years of education: Not on file  . Highest education level: 12th grade  Occupational History  . Occupation: Retired  Tobacco Use  . Smoking status: Never Smoker  . Smokeless tobacco: Never Used  Vaping Use  . Vaping Use: Never used  Substance and Sexual Activity  . Alcohol use: No    Alcohol/week: 0.0 standard drinks  . Drug use: No  . Sexual activity: Not Currently  Other Topics Concern  . Not on file  Social History Narrative  . Not on file   Social Determinants of Health   Financial Resource Strain: Low Risk   . Difficulty of Paying Living Expenses: Not hard at all  Food Insecurity: No Food Insecurity  . Worried About Charity fundraiser in the Last Year: Never true  . Ran Out of Food in the  Last Year: Never true  Transportation Needs: Not on file  Physical Activity: Inactive  . Days of Exercise per Week: 0 days  . Minutes of Exercise per Session: 0 min  Stress: No Stress Concern Present  . Feeling of Stress : Not at all  Social Connections: Moderately Integrated  . Frequency of Communication with Friends and Family: More than three times a week  . Frequency of Social Gatherings with Friends and Family: More than three times a week  . Attends Religious Services: More than 4 times per year  . Active Member of Clubs or Organizations: Yes  . Attends Archivist Meetings: More than 4 times per year  . Marital Status: Widowed  Intimate Partner Violence: Not At Risk  . Fear of Current or Ex-Partner: No  . Emotionally Abused: No  . Physically Abused: No  . Sexually Abused: No   Family History  Problem Relation Age of Onset  . Hypertension Mother   . Pneumonia Father   . Stomach cancer Brother   . Stroke Brother   . AAA (abdominal aortic aneurysm) Brother   . Cervical cancer Daughter   . Heart attack Neg Hx   . Colon cancer Neg Hx   .  Esophageal cancer Neg Hx       VITAL SIGNS BP 132/72   Pulse 78   Temp 98.4 F (36.9 C)   Resp 18   Ht 5\' 1"  (1.549 m)   Wt 104 lb 3.2 oz (47.3 kg)   SpO2 93%   BMI 19.69 kg/m   Outpatient Encounter Medications as of 08/31/2020  Medication Sig  . acetaminophen (TYLENOL) 325 MG tablet Take 2 tablets (650 mg total) by mouth every 4 (four) hours as needed for mild pain (or temp > 37.5 C (99.5 F)).  [name] albuterol (PROAIR HFA) 108 (90 Base) MCG/ACT inhaler Inhale 2 puffs into the lungs every 4 (four) hours as needed for wheezing or shortness of breath.  Roseanne Kaufman [place]-Castor Oil OINT Apply 1 application topically daily. Apply to sacrum and bilateral buttocks every shift  . Biotin 10 MG CAPS Take 10 mg by mouth daily.   . Budeson-Glycopyrrol-Formoterol (BREZTRI AEROSPHERE) 160-9-4.8 MCG/ACT AERO Inhale 2 Inhalers into the lungs in the morning and at bedtime.  . busPIRone (BUSPAR) 5 MG tablet Take 5 mg by mouth 2 (two) times daily.  . Calcium Carb-Cholecalciferol (CALTRATE 600+D3) 600-800 MG-UNIT TABS Take 1 tablet by mouth daily.  [name] docusate sodium (COLACE) 100 MG capsule Take 1 capsule (100 mg total) by mouth 2 (two) times daily.  [name] 25 MCG tablet Take 1 tablet by mouth once daily  . famotidine (PEPCID) 20 MG tablet Take 1 tablet (20 mg total) by mouth at bedtime.  . folic acid (FOLVITE) 1 MG tablet Take 1 tablet (1 mg total) by mouth daily.  . furosemide (LASIX) 40 MG tablet Take 1 tablet (40 mg total) by mouth daily.  . iron polysaccharides (NIFEREX) 150 MG capsule Take 1 capsule (150 mg total) by mouth daily.  [name] levalbuterol (XOPENEX) 1.25 MG/3ML nebulizer solution Take 1.25 mg by nebulization every 4 (four) hours as needed for wheezing. Dx J45.909  . memantine (NAMENDA) 10 MG tablet Take 1 tablet (10 mg total) by mouth 2 (two) times daily.  . metoprolol tartrate (LOPRESSOR) 25 MG tablet Take 1 tablet (25 mg total) by mouth 2 (two) times daily.  . NON FORMULARY Diet NAS  .  pantoprazole (PROTONIX) 40 MG tablet Take 1 tablet by  mouth twice daily  . potassium chloride (KLOR-CON) 10 MEQ tablet Take 2 tablets (20 mEq total) by mouth daily.   No facility-administered encounter medications on file as of 08/31/2020.     SIGNIFICANT DIAGNOSTIC EXAMS  PREVIOUS   08-13-20: ct head:  No acute abnormality. Atrophy and chronic microvascular ischemic disease.  08-13-20: ct angio of head and neck:  1. 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 2. 26 mL ischemic penumbra in the left MCA territory without evidence of a core infarct. 3. Mild-to-moderate cervical carotid atherosclerosis without significant stenosis. 4. Aortic Atherosclerosis   08-14-20: carotid doppler: Color duplex indicates minimal heterogeneous and calcified plaque, with no hemodynamically significant stenosis by duplex criteria in the extracranial cerebrovascular circulation.  08-17-20: ct of chest; abdomen and pelvis Chest Impression: 1. Scattered peribronchial thickening in LEFT and RIGHT lung suggest mild pulmonary inflammation or infection. 2. Focus of consolidation with pleural fluid at the RIGHT lung base differential include pneumonia or round atelectasis.   Abdomen / Pelvis Impression: 1. Circumferential submucosal thickening over 6 cm segment of the ascending colon. 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 Recommend endoscopy for further evaluation. 2. No evidence of metastatic adenopathy other than a small retrocrural lymph node which is favored unrelated 3. No liver metastasis. 4. Multiple diverticula bladder.  NO NEW EXAMS.   LABS REVIEWED PREVIOUS   08-13-20; wbc 7.5; hgb 9.4; hct 31.8; mcv 95.8 plt 270; glucose 122; bun 24; creat 1.28; k+ 3.9; na++ 135; ca 8.7 liver normal albumin 2.9 08-14-20; hgb a1c 4.7; chol 134; ldl 66; trig 96; hdl 49 08-17-20: wbc 7.7; hgb 9.4; hct 30.4; mcv 94.4 plt 242; glucose 111; bun 17; creat 1.16; k+ 3.7; na++ 136; ca 8.9 liver normal albumin  2.7   NO NEW LABS.    Review of Systems  Unable to perform ROS: Dementia (unable to participate )   Physical Exam Constitutional:      General: She is not in acute distress.    Appearance: She is well-developed and well-nourished. She is not diaphoretic.  Neck:     Thyroid: No thyromegaly.  Cardiovascular:     Rate and Rhythm: Normal rate and regular rhythm.     Pulses: Normal pulses and intact distal pulses.     Heart sounds: Normal heart sounds.     Comments: Psychologist, forensic in place  Pulmonary:     Effort: Pulmonary effort is normal. No respiratory distress.     Breath sounds: Normal breath sounds.  Abdominal:     General: Bowel sounds are normal. There is no distension.     Palpations: Abdomen is soft.     Tenderness: There is no abdominal tenderness.  Musculoskeletal:        General: No edema. Normal range of motion.     Cervical back: Neck supple.     Right lower leg: No edema.     Left lower leg: No edema.  Lymphadenopathy:     Cervical: No cervical adenopathy.  Skin:    General: Skin is warm and dry.  Neurological:     Mental Status: She is alert. Mental status is at baseline.  Psychiatric:        Mood and Affect: Mood and affect and mood normal.     ASSESSMENT/ PLAN:  TODAY  1. Alzheimer's disease unspecified age onset 2. Aortic atherosclerosis 3. Cerebrovascular accident (CVA) unspecified mechanism  Will continue current medications Will continue current plan of care Will continue therapy as directed Her goal of care is  for long term placement at this time.    Ok Edwards NP [facility]  Contact [phone] Monday through Friday 8am- 5pm  After hours call [phone]

## 2020-09-02 ENCOUNTER — Other Ambulatory Visit (HOSPITAL_COMMUNITY): Payer: Self-pay

## 2020-09-02 DIAGNOSIS — D500 Iron deficiency anemia secondary to blood loss (chronic): Secondary | ICD-10-CM

## 2020-09-05 ENCOUNTER — Inpatient Hospital Stay (HOSPITAL_COMMUNITY): Payer: Medicare Other | Attending: Hematology

## 2020-09-05 ENCOUNTER — Ambulatory Visit (INDEPENDENT_AMBULATORY_CARE_PROVIDER_SITE_OTHER): Payer: Medicare Other

## 2020-09-05 ENCOUNTER — Other Ambulatory Visit: Payer: Self-pay

## 2020-09-05 DIAGNOSIS — I480 Paroxysmal atrial fibrillation: Secondary | ICD-10-CM

## 2020-09-05 DIAGNOSIS — D500 Iron deficiency anemia secondary to blood loss (chronic): Secondary | ICD-10-CM

## 2020-09-05 LAB — COMPREHENSIVE METABOLIC PANEL
ALT: 22 U/L (ref 0–44)
AST: 31 U/L (ref 15–41)
Albumin: 2.9 g/dL — ABNORMAL LOW (ref 3.5–5.0)
Alkaline Phosphatase: 100 U/L (ref 38–126)
Anion gap: 10 (ref 5–15)
BUN: 22 mg/dL (ref 8–23)
CO2: 24 mmol/L (ref 22–32)
Calcium: 8.5 mg/dL — ABNORMAL LOW (ref 8.9–10.3)
Chloride: 97 mmol/L — ABNORMAL LOW (ref 98–111)
Creatinine, Ser: 1.55 mg/dL — ABNORMAL HIGH (ref 0.44–1.00)
GFR, Estimated: 31 mL/min — ABNORMAL LOW (ref >60)
Glucose, Bld: 123 mg/dL — ABNORMAL HIGH (ref 70–99)
Potassium: 4.1 mmol/L (ref 3.5–5.1)
Sodium: 131 mmol/L — ABNORMAL LOW (ref 135–145)
Total Bilirubin: 0.6 mg/dL (ref 0.3–1.2)
Total Protein: 6.6 g/dL (ref 6.5–8.1)

## 2020-09-05 LAB — IRON AND TIBC
Iron: 26 ug/dL — ABNORMAL LOW (ref 28–170)
Saturation Ratios: 12 % (ref 10.4–31.8)
TIBC: 222 ug/dL — ABNORMAL LOW (ref 250–450)
UIBC: 196 ug/dL

## 2020-09-05 LAB — CBC WITH DIFFERENTIAL/PLATELET
Abs Immature Granulocytes: 0.04 10*3/uL (ref 0.00–0.07)
Basophils Absolute: 0.1 10*3/uL (ref 0.0–0.1)
Basophils Relative: 1 %
Eosinophils Absolute: 0.1 10*3/uL (ref 0.0–0.5)
Eosinophils Relative: 1 %
HCT: 31.5 % — ABNORMAL LOW (ref 36.0–46.0)
Hemoglobin: 9.8 g/dL — ABNORMAL LOW (ref 12.0–15.0)
Immature Granulocytes: 1 %
Lymphocytes Relative: 8 %
Lymphs Abs: 0.7 10*3/uL (ref 0.7–4.0)
MCH: 29.4 pg (ref 26.0–34.0)
MCHC: 31.1 g/dL (ref 30.0–36.0)
MCV: 94.6 fL (ref 80.0–100.0)
Monocytes Absolute: 0.7 10*3/uL (ref 0.1–1.0)
Monocytes Relative: 9 %
Neutro Abs: 6.4 10*3/uL (ref 1.7–7.7)
Neutrophils Relative %: 80 %
Platelets: 245 10*3/uL (ref 150–400)
RBC: 3.33 MIL/uL — ABNORMAL LOW (ref 3.87–5.11)
RDW: 17.2 % — ABNORMAL HIGH (ref 11.5–15.5)
WBC: 8 10*3/uL (ref 4.0–10.5)
nRBC: 0 % (ref 0.0–0.2)

## 2020-09-05 LAB — FERRITIN: Ferritin: 79 ng/mL (ref 11–307)

## 2020-09-05 LAB — VITAMIN B12: Vitamin B-12: >7500 pg/mL — ABNORMAL HIGH (ref 180–914)

## 2020-09-06 LAB — CUP PACEART REMOTE DEVICE CHECK
Battery Remaining Longevity: 39 mo
Battery Voltage: 2.98 V
Brady Statistic RA Percent Paced: 0 %
Brady Statistic RV Percent Paced: 81.27 %
Date Time Interrogation Session: 20220312115714
Implantable Lead Implant Date: 20170503
Implantable Lead Implant Date: 20170503
Implantable Lead Location: 753859
Implantable Lead Location: 753860
Implantable Lead Model: 5076
Implantable Lead Model: 5076
Implantable Pulse Generator Implant Date: 20170503
Lead Channel Impedance Value: 285 Ohm
Lead Channel Impedance Value: 342 Ohm
Lead Channel Impedance Value: 380 Ohm
Lead Channel Impedance Value: 418 Ohm
Lead Channel Pacing Threshold Amplitude: 1 V
Lead Channel Pacing Threshold Amplitude: 1 V
Lead Channel Pacing Threshold Pulse Width: 0.4 ms
Lead Channel Pacing Threshold Pulse Width: 0.4 ms
Lead Channel Sensing Intrinsic Amplitude: 3 mV
Lead Channel Sensing Intrinsic Amplitude: 3 mV
Lead Channel Sensing Intrinsic Amplitude: 7.75 mV
Lead Channel Sensing Intrinsic Amplitude: 7.75 mV
Lead Channel Setting Pacing Amplitude: 2 V
Lead Channel Setting Pacing Amplitude: 2.5 V
Lead Channel Setting Pacing Pulse Width: 0.4 ms
Lead Channel Setting Sensing Sensitivity: 2.8 mV

## 2020-09-07 ENCOUNTER — Other Ambulatory Visit: Payer: Self-pay | Admitting: *Deleted

## 2020-09-07 NOTE — Patient Outreach (Signed)
Member screened for potential [facility] Care Management needs.  Crystal Cooper is receiving skilled therapy at [facility]   Update received from [facility] SNF SW reports member likely going to stay long term. Member had 24 hr care prior. Crystal Cooper has dementia.   No identifiable [facility] Care Management needs if member remains in SNF.    Marthenia Rolling, MSN, RN,BSN [place] Acute Care Coordinator [phone] [place] [facility]) [phone]  (Toll free office)

## 2020-09-12 ENCOUNTER — Ambulatory Visit (HOSPITAL_COMMUNITY): Payer: Medicare Other

## 2020-09-12 ENCOUNTER — Ambulatory Visit: Payer: Medicare Other

## 2020-09-12 ENCOUNTER — Ambulatory Visit (HOSPITAL_COMMUNITY): Payer: Medicare Other | Admitting: Hematology

## 2020-09-13 ENCOUNTER — Ambulatory Visit (HOSPITAL_COMMUNITY): Payer: Medicare Other

## 2020-09-13 ENCOUNTER — Ambulatory Visit (HOSPITAL_COMMUNITY): Payer: Medicare Other | Admitting: Oncology

## 2020-09-14 ENCOUNTER — Other Ambulatory Visit: Payer: Self-pay | Admitting: *Deleted

## 2020-09-14 ENCOUNTER — Telehealth (HOSPITAL_COMMUNITY): Payer: Medicare Other | Admitting: Hematology

## 2020-09-14 ENCOUNTER — Ambulatory Visit: Payer: Medicare Other | Admitting: *Deleted

## 2020-09-14 DIAGNOSIS — F0280 Dementia in other diseases classified elsewhere without behavioral disturbance: Secondary | ICD-10-CM

## 2020-09-14 NOTE — Patient Outreach (Addendum)
[facility] Post- Acute Care Coordinator follow up. Member screened for potential [facility] Care Management needs.  Mrs. Frenkel is receiving skilled therapy at [facility]. Update received from [place] reporting member will remain long term care.   No identifiable [facility] needs at this time. Will update [facility] embedded care coordination team at PCP office.   Marthenia Rolling, MSN, RN,BSN [place] Acute Care Coordinator [phone] [facility]) [phone]  (Toll free office)

## 2020-09-14 NOTE — Patient Instructions (Signed)
CCM team will be available for services in the future if her long-term care situation changes and CCM services are again needed.  [name], BSN, RN-BC Embedded Chronic Care Manager [facility] [place] Management Direct Dial: [phone]

## 2020-09-14 NOTE — Chronic Care Management (AMB) (Signed)
  Chronic Care Management   Note  09/14/2020 Name: Crystal Cooper MRN: [id number] DOB: Jan 10, 1927  Collaborated with [facility] Post- Acute Care Coordinator, Marthenia Rolling, RN regarding patient's stay at [facility].   Per my last conversation with patient's daughter, her stay at SNF was for short-term rehab. Update provided today that patient will remain at SNF long-term. Embedded CCM services are no longer needed. Patient will be removed from [place] and CCM Team Members will be removed from Care Team list. [place] Team Member, [place] forrest, LCSW, made aware.  Follow up plan: The CCM Team will be available to provide assistance in the future if her situation changes and services are needed.  [name], BSN, RN-BC Embedded Chronic Care Manager [facility] [place] Management Direct Dial: [phone]

## 2020-09-14 NOTE — Progress Notes (Signed)
Remote pacemaker transmission.   

## 2020-09-16 ENCOUNTER — Inpatient Hospital Stay (HOSPITAL_BASED_OUTPATIENT_CLINIC_OR_DEPARTMENT_OTHER): Payer: Medicare Other | Admitting: Hematology

## 2020-09-16 DIAGNOSIS — K6389 Other specified diseases of intestine: Secondary | ICD-10-CM

## 2020-09-16 DIAGNOSIS — D500 Iron deficiency anemia secondary to blood loss (chronic): Secondary | ICD-10-CM | POA: Diagnosis not present

## 2020-09-16 NOTE — Progress Notes (Signed)
Virtual Visit via Telephone Note  I connected with Crystal Cooper on 09/16/20 at 11:00 AM EDT by telephone and verified that I am speaking with the correct person using two identifiers.  Location: Patient: At [facility] Provider: At home   I discussed the limitations, risks, security and privacy concerns of performing an evaluation and management service by telephone and the availability of in person appointments. I also discussed with the patient that there may be a patient responsible charge related to this service. The patient expressed understanding and agreed to proceed.   History of Present Illness: She is seen in our clinic for anemia secondary to CKD and iron deficiency and chronic GI bleed.  She has some dementia and is currently residing at [facility].   Observations/Objective: I have talked with her daughters Quita Skye and [name].  Patient is at [facility].  She sits in a wheelchair and walks with the help of walker.  She has bleeding per rectum which is bright red blood on and off.  She also has melena.  Her last iron infusion was end of January this year.  Assessment and Plan:  1.  Normocytic anemia: -This is from combination of CKD, iron deficiency and blood loss. -Last Feraheme on 07/15/2020 on 07/22/2020. -She continues to have rectal bleeding on and off. -Reviewed labs from 09/05/2020.  Hemoglobin 9.8 with MCV 94.  B12 was normal.  Ferritin was 79 with percent saturation of 12.  Creatinine is 1.58. -Recommend Feraheme x2. -RTC 2 months with repeat labs.  2.  Presumed ascending colon cancer: -CTAP on 08/17/2020 showed 6 cm ascending colon mass with no adenopathy.  No liver metastasis. -We reviewed findings on the CT scan. -Because of her advanced age, dementia, decision was made to not to do any aggressive measures like colonoscopy or surgery. -Conservative management at this time.  Patient is DNR. -We will check a CEA level at next visit. -Her daughters expressed concern  for possible bowel obstruction and if there is any need for CT scans to monitor.  We will look into it at next visit if needed.  Follow Up Instructions: We will schedule for Feraheme x2.   RTC 2 months for follow-up.   I discussed the assessment and treatment plan with the patient. The patient was provided an opportunity to ask questions and all were answered. The patient agreed with the plan and demonstrated an understanding of the instructions.   The patient was advised to call back or seek an in-person evaluation if the symptoms worsen or if the condition fails to improve as anticipated.  I provided 22 minutes of non-face-to-face time during this encounter.   Derek Jack, MD

## 2020-09-17 ENCOUNTER — Other Ambulatory Visit (HOSPITAL_COMMUNITY)
Admission: RE | Admit: 2020-09-17 | Discharge: 2020-09-17 | Disposition: A | Discharge status: Home / Self Care | Payer: Medicare Other | Source: Skilled Nursing Facility | Attending: Adult Health | Admitting: Adult Health

## 2020-09-17 ENCOUNTER — Other Ambulatory Visit (HOSPITAL_COMMUNITY): Payer: Self-pay | Admitting: Hematology

## 2020-09-17 DIAGNOSIS — C189 Malignant neoplasm of colon, unspecified: Secondary | ICD-10-CM | POA: Diagnosis not present

## 2020-09-17 LAB — TSH: TSH: 1.637 u[IU]/mL (ref 0.350–4.500)

## 2020-09-19 ENCOUNTER — Telehealth: Payer: Self-pay | Admitting: Cardiology

## 2020-09-19 NOTE — Telephone Encounter (Signed)
Crystal Cooper is calling stating she is wanting to switch Crystal Cooper to the Crystal Cooper office so travel is not as hard for her for appointments. She is requesting Dr. Jacalyn Cooper recommendation on who Crystal Cooper should see at our [place] office. Pleas advise.

## 2020-09-19 NOTE — Telephone Encounter (Signed)
Spoke with the daughter. She would like to switch to the [place] office since it is closer for the patient.  Per her request, the appointment on 4/4 with Dr. Stanford Breed has been canceled.

## 2020-09-19 NOTE — Telephone Encounter (Signed)
Dr Domenic Polite if he has availability [name]

## 2020-09-20 ENCOUNTER — Non-Acute Institutional Stay (SKILLED_NURSING_FACILITY): Payer: Medicare Other | Admitting: Adult Health

## 2020-09-20 DIAGNOSIS — J439 Emphysema, unspecified: Secondary | ICD-10-CM | POA: Diagnosis not present

## 2020-09-20 DIAGNOSIS — G309 Alzheimer's disease, unspecified: Secondary | ICD-10-CM | POA: Diagnosis not present

## 2020-09-20 DIAGNOSIS — F0280 Dementia in other diseases classified elsewhere without behavioral disturbance: Secondary | ICD-10-CM

## 2020-09-20 DIAGNOSIS — I700 Atherosclerosis of aorta: Secondary | ICD-10-CM | POA: Diagnosis not present

## 2020-09-20 DIAGNOSIS — E440 Moderate protein-calorie malnutrition: Secondary | ICD-10-CM

## 2020-09-20 DIAGNOSIS — I5032 Chronic diastolic (congestive) heart failure: Secondary | ICD-10-CM | POA: Diagnosis not present

## 2020-09-20 DIAGNOSIS — I130 Hypertensive heart and chronic kidney disease with heart failure and stage 1 through stage 4 chronic kidney disease, or unspecified chronic kidney disease: Secondary | ICD-10-CM | POA: Diagnosis not present

## 2020-09-20 DIAGNOSIS — C189 Malignant neoplasm of colon, unspecified: Secondary | ICD-10-CM

## 2020-09-20 DIAGNOSIS — N1832 Chronic kidney disease, stage 3b: Secondary | ICD-10-CM

## 2020-09-20 DIAGNOSIS — Z952 Presence of prosthetic heart valve: Secondary | ICD-10-CM

## 2020-09-20 DIAGNOSIS — Z950 Presence of cardiac pacemaker: Secondary | ICD-10-CM

## 2020-09-20 DIAGNOSIS — I639 Cerebral infarction, unspecified: Secondary | ICD-10-CM

## 2020-09-20 DIAGNOSIS — D500 Iron deficiency anemia secondary to blood loss (chronic): Secondary | ICD-10-CM

## 2020-09-20 DIAGNOSIS — I480 Paroxysmal atrial fibrillation: Secondary | ICD-10-CM

## 2020-09-20 DIAGNOSIS — K219 Gastro-esophageal reflux disease without esophagitis: Secondary | ICD-10-CM

## 2020-09-20 DIAGNOSIS — K5909 Other constipation: Secondary | ICD-10-CM

## 2020-09-20 DIAGNOSIS — F0394 Unspecified dementia, unspecified severity, with anxiety: Secondary | ICD-10-CM

## 2020-09-20 DIAGNOSIS — F0391 Unspecified dementia with behavioral disturbance: Secondary | ICD-10-CM

## 2020-09-20 IMAGING — DX DG CHEST 1V PORT
1 series · 1 of 1 positions shown · non-contrast
Comparison: 12/13/2019.  10/19/2019.

CLINICAL DATA: Shortness of breath.

EXAM:
PORTABLE CHEST 1 VIEW

[chest ap]
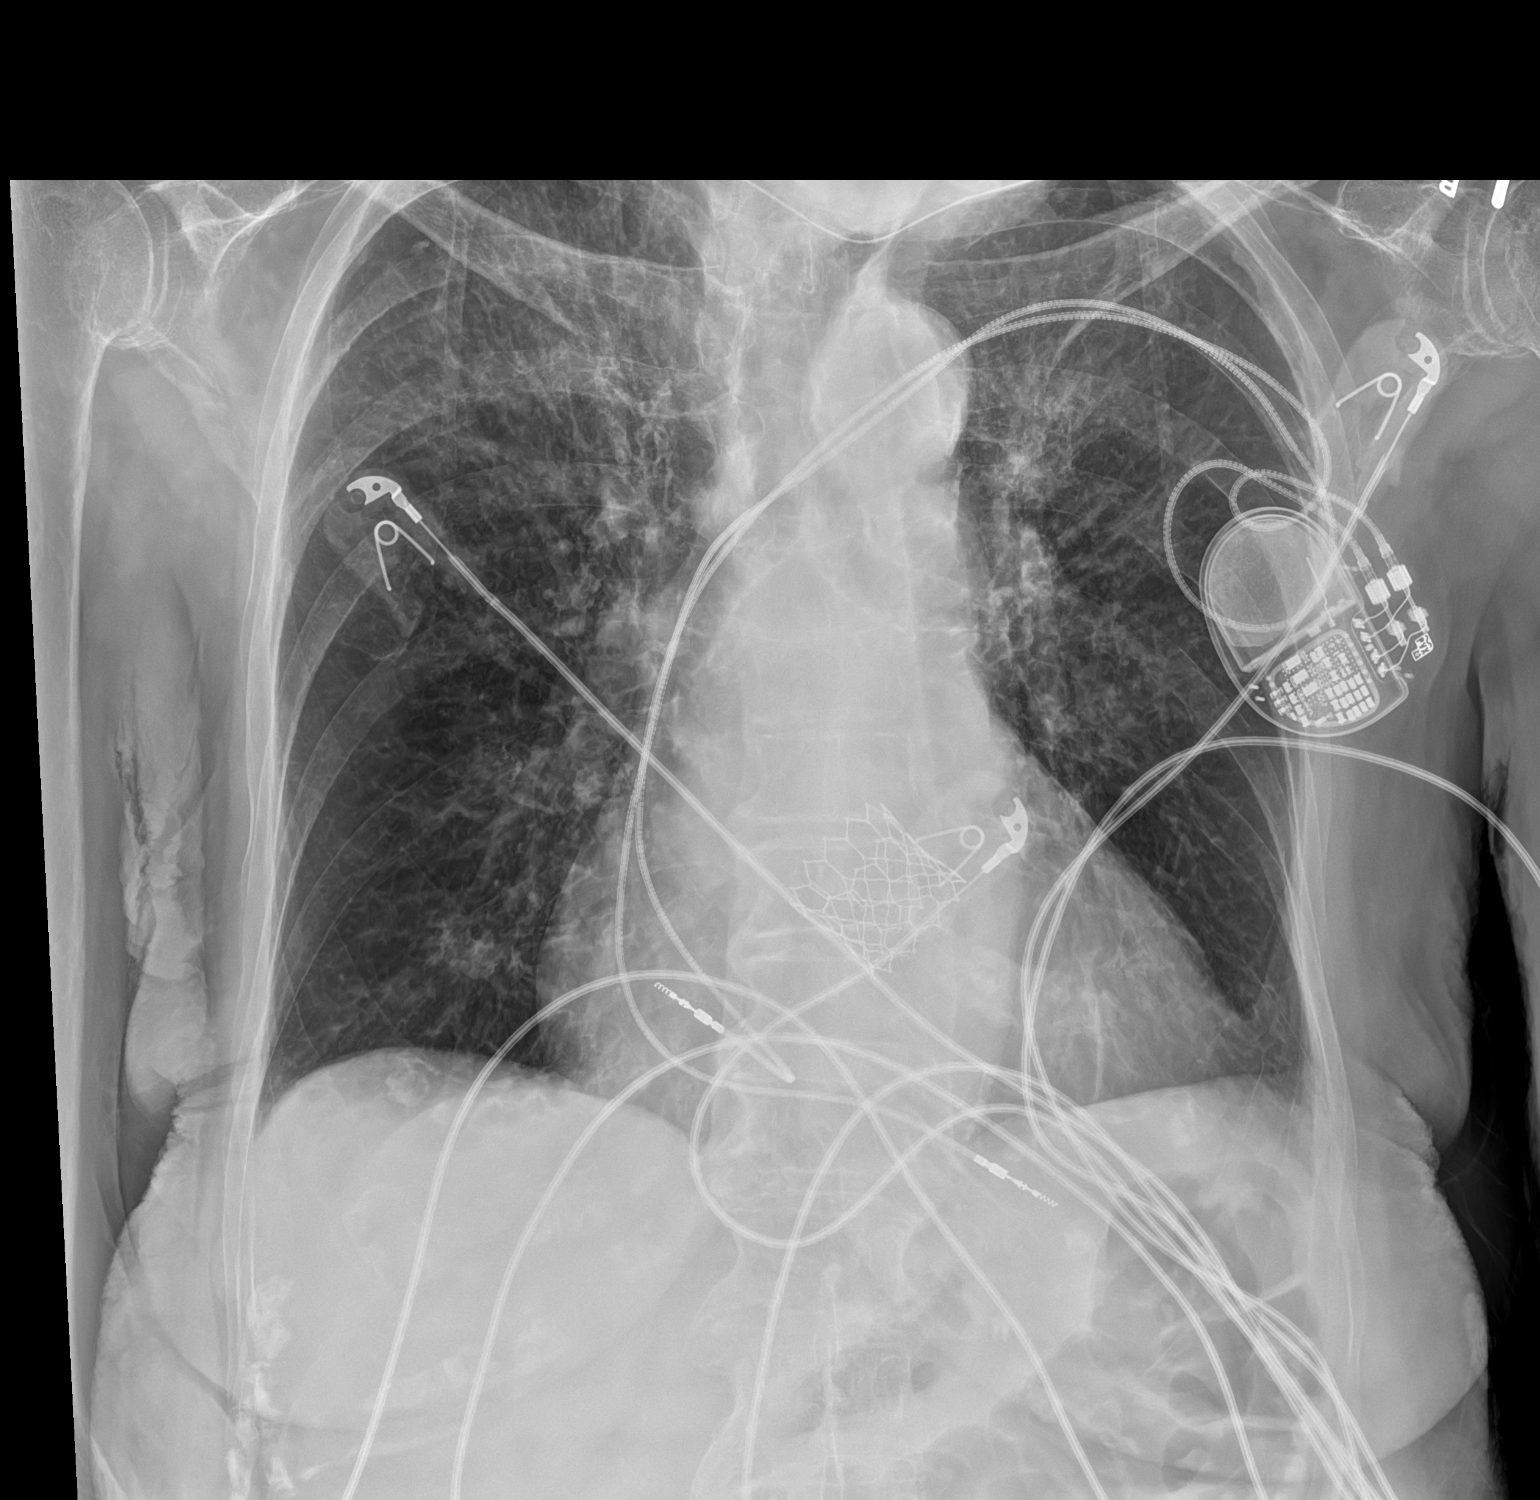

[1 of 1 positions shown; findings below may reference images not displayed]

FINDINGS: Cardiac pacer stable position. Prior cardiac valve replacement.
Stable cardiomegaly. No pulmonary venous congestion. Bilateral upper
lobe pulmonary infiltrates noted on today's exam. No pleural
effusion or pneumothorax.
IMPRESSION: 1. Cardiac pacer stable position. Prior cardiac valve replacement.
Stable cardiomegaly. No pulmonary venous congestion.

2. Bilateral upper lobe pulmonary infiltrates noted on today's exam.

## 2020-09-21 ENCOUNTER — Encounter: Payer: Self-pay | Admitting: Adult Health

## 2020-09-21 DIAGNOSIS — I5032 Chronic diastolic (congestive) heart failure: Secondary | ICD-10-CM | POA: Insufficient documentation

## 2020-09-21 DIAGNOSIS — K5909 Other constipation: Secondary | ICD-10-CM | POA: Insufficient documentation

## 2020-09-21 DIAGNOSIS — N1832 Chronic kidney disease, stage 3b: Secondary | ICD-10-CM | POA: Insufficient documentation

## 2020-09-21 DIAGNOSIS — F0391 Unspecified dementia with behavioral disturbance: Secondary | ICD-10-CM | POA: Insufficient documentation

## 2020-09-21 DIAGNOSIS — F0394 Unspecified dementia, unspecified severity, with anxiety: Secondary | ICD-10-CM | POA: Insufficient documentation

## 2020-09-21 NOTE — Telephone Encounter (Signed)
I am certainly happy to help out however I can, but our availability even for routine follow-up at this time is fairly limited.  If her next anticipated visit is routine and in the order of 6 months from now, that should not be a problem.

## 2020-09-21 NOTE — Telephone Encounter (Signed)
New Message:      Pt would like to switch to Dr Domenic Polite from Dr Stanford Breed, it is closer to her home. Is this okay with you Dr Domenic Polite?

## 2020-09-21 NOTE — Progress Notes (Signed)
Location:  [place] Room Number: 671 Place of Service:  SNF (31)   CODE STATUS: dnr  Allergies  Allergen Reactions  . Hctz [Hydrochlorothiazide] Other (See Comments)    Pt was ill and this affected her kidneys   . Aspirin Other (See Comments)    Cardiologist said the patient is to not take this  . Codeine Other (See Comments)    Made the patient feel ill, has not had any problems since 1977    Chief Complaint  Patient presents with  . Medical Management of Chronic Issues         Aortic atherosclerosis:  Chronic diastolic congestive heart failure:  Cerebral vascular accident (CVA) unspecified mechanism    HPI:  She is a 87 year old long term resident of this facility being seen for the management of her chronic illnesses: Aortic atherosclerosis:  Chronic diastolic congestive heart failure:  Cerebral vascular accident (CVA) unspecified mechanism. There are no reports of anxiety; no reports of cough; no shortness of breath.   Past Medical History:  Diagnosis Date  . Acute CVA (cerebrovascular accident) ([place]) 08/27/2019  . Acute GI bleeding 06/22/2020  . Acute renal failure superimposed on stage 3b chronic kidney disease ([place])   . AKI (acute kidney injury) ([place])   . Anemia    years ago  . Angio-edema, initial encounter 08/23/2019  . Aortic stenosis   . Arthritis   . Asthma   . Atrial fibrillation ([place])   . Blood transfusion without reported diagnosis   . CHF (congestive heart failure) ([place]) 11/2014  . CKD (chronic kidney disease)    had many uti this year   . Closed left hip fracture, initial encounter ([place]) 03/09/2020  . Dementia ([place])   . Family history of adverse reaction to anesthesia    2 daughters would have N/V  . Gastric AVM   . GERD (gastroesophageal reflux disease)    was seen by Gi for bleeding ulcer which was cauterized   . GI bleed   . Glaucoma   . Hearing loss   . Heart murmur    Rheumatic fever whe she was young,  sees Dr.  Stanford Breed in [place]  . HTN (hypertension)   . Hypokalemia   . Myocardial infarction ([place])    in her 76 when she had two heart attacks   . PNA (pneumonia) 08/25/2019  . Pneumonia    stayed from friday to sunday in august  . Presence of permanent cardiac pacemaker    placed about 4 years ago  . Prolapsing mitral leaflet syndrome   . Shortness of breath dyspnea    with exertion  . Stroke ([place])   . SVT (supraventricular tachycardia) ([facility])    S/P ablation of AVNRT in 2003    Past Surgical History:  Procedure Laterality Date  . APPENDECTOMY    . BIOPSY  04/07/2018   Procedure: BIOPSY;  Surgeon: Jerene Bears, MD;  Location: [facility] ENDOSCOPY;  Service: Gastroenterology;;  . BIOPSY  02/19/2020   Procedure: BIOPSY;  Surgeon: Harvel Quale, MD;  [facility] ENDO SU[facility];  Service: Gastroenterology;;  gastric   . BLADDER SURGERY    . CARDIAC CATHETERIZATION N/A 09/26/2015   Procedure: Right/Left Heart Cath and Coronary Angiography;  Surgeon: Burnell Blanks, MD;  Location: [place] CV LAB;  Service: Cardiovascular;  Laterality: N/A;  . CARDIAC SURGERY    . CARDIOVERSION N/A 03/02/2016   Procedure: CARDIOVERSION;  Surgeon: Thayer Headings, MD;  Location: [facility] ENDOSCOPY;  Service: Cardiovascular;  Laterality: N/A;  . ENTEROSCOPY  02/19/2020   Procedure: ENTEROSCOPY;  Surgeon: Harvel Quale, MD;  [facility] ENDO [facility];  Service: Gastroenterology;;  . EP IMPLANTABLE DEVICE N/A 10/26/2015   Procedure: Pacemaker Implant;  Surgeon: Will Meredith Leeds, MD;  Location: [place] CV LAB;  Service: Cardiovascular;  Laterality: N/A;  . ESOPHAGOGASTRODUODENOSCOPY (EGD) WITH PROPOFOL N/A 04/07/2018   Procedure: ESOPHAGOGASTRODUODENOSCOPY (EGD) WITH PROPOFOL;  Surgeon: Jerene Bears, MD;  Location: [facility] ENDOSCOPY;  Service: Gastroenterology;  Laterality: N/A;  . ESOPHAGOGASTRODUODENOSCOPY (EGD) WITH PROPOFOL N/A 10/12/2019   Procedure: ESOPHAGOGASTRODUODENOSCOPY (EGD) WITH  PROPOFOL;  Surgeon: Danie Binder, MD;  [facility];  Service: Endoscopy;  Laterality: N/A;  . EYE SURGERY Bilateral    cataract surgery  . FLEXIBLE SIGMOIDOSCOPY  02/19/2020   Procedure: FLEXIBLE SIGMOIDOSCOPY;  Surgeon: Harvel Quale, MD;  [facility];  Service: Gastroenterology;;  . HIP FRACTURE SURGERY Left 02/2020  . HOT HEMOSTASIS N/A 04/07/2018   Procedure: HOT HEMOSTASIS (ARGON PLASMA COAGULATION/BICAP);  Surgeon: Jerene Bears, MD;  Location[facility]COPY;  Service: Gastroenterology;  Laterality: N/A;  . HOT HEMOSTASIS  10/12/2019   Procedure: HOT HEMOSTASIS (ARGON PLASMA COAGULATION/BICAP);  Surgeon: Danie Binder, MD;  [facility];  Service: Endoscopy;;  . INTRAMEDULLARY (IM) NAIL INTERTROCHANTERIC Left 03/10/2020   Procedure: OPEN TREATMENT INTERNAL FIXATION LEFT HIP (WITH GAMMA NAIL);  Surgeon: Carole Civil, MD;  Location: [facility] ORS;  Service: Orthopedics;  Laterality: Left;  . NASAL SINUS SURGERY    . PACEMAKER INSERTION    . TEE WITHOUT CARDIOVERSION N/A 10/25/2015   Procedure: TRANSESOPHAGEAL ECHOCARDIOGRAM (TEE);  Surgeon: Burnell Blanks, MD;  Location: [place];  Service: Open Heart Surgery;  Laterality: N/A;  . TRANSCATHETER AORTIC VALVE REPLACEMENT, TRANSFEMORAL Right 10/25/2015   Procedure: TRANSCATHETER AORTIC VALVE REPLACEMENT, TRANSFEMORAL;  Surgeon: Burnell Blanks, MD;  Location: [place];  Service: Open Heart Surgery;  Laterality: Right;    Social History   Socioeconomic History  . Marital status: Widowed    Spouse name: Not on file  . Number of children: 3  . Years of education: Not on file  . Highest education level: 12th grade  Occupational History  . Occupation: Retired  Tobacco Use  . Smoking status: Never Smoker  . Smokeless tobacco: Never Used  Vaping Use  . Vaping Use: Never used  Substance and Sexual Activity  . Alcohol use: No    Alcohol/week: 0.0 standard drinks  . Drug use: No  .  Sexual activity: Not Currently  Other Topics Concern  . Not on file  Social History Narrative  . Not on file   Social Determinants of Health   Financial Resource Strain: Low Risk   . Difficulty of Paying Living Expenses: Not hard at all  Food Insecurity: No Food Insecurity  . Worried About Charity fundraiser in the Last Year: Never true  . Ran Out of Food in the Last Year: Never true  Transportation Needs: Not on file  Physical Activity: Inactive  . Days of Exercise per Week: 0 days  . Minutes of Exercise per Session: 0 min  Stress: No Stress Concern Present  . Feeling of Stress : Not at all  Social Connections: Moderately Integrated  . Frequency of Communication with Friends and Family: More than three times a week  . Frequency of Social Gatherings with Friends and Family: More than three times a week  .  Attends Religious Services: More than 4 times per year  . Active Member of Clubs or Organizations: Yes  . Attends Archivist Meetings: More than 4 times per year  . Marital Status: Widowed  Intimate Partner Violence: Not At Risk  . Fear of Current or Ex-Partner: No  . Emotionally Abused: No  . Physically Abused: No  . Sexually Abused: No   Family History  Problem Relation Age of Onset  . Hypertension Mother   . Pneumonia Father   . Stomach cancer Brother   . Stroke Brother   . AAA (abdominal aortic aneurysm) Brother   . Cervical cancer Daughter   . Heart attack Neg Hx   . Colon cancer Neg Hx   . Esophageal cancer Neg Hx       VITAL SIGNS BP 138/67   Pulse 84   Temp 97.9 F (36.6 C)   Resp 20   Ht 5\' 1"  (1.549 m)   Wt 104 lb 3.2 oz (47.3 kg)   BMI 19.69 kg/m   Outpatient Encounter Medications as of 09/20/2020  Medication Sig  . acetaminophen (TYLENOL) 325 MG tablet Take 2 tablets (650 mg total) by mouth every 4 (four) hours as needed for mild pain (or temp > 37.5 C (99.5 F)).  [name] albuterol (PROAIR HFA) 108 (90 Base) MCG/ACT inhaler Inhale 2 puffs  into the lungs every 4 (four) hours as needed for wheezing or shortness of breath.  Roseanne Kaufman [place]-Castor Oil OINT Apply 1 application topically daily. Apply to sacrum and bilateral buttocks every shift  . Biotin 10 MG CAPS Take 10 mg by mouth daily.   . Budeson-Glycopyrrol-Formoterol (BREZTRI AEROSPHERE) 160-9-4.8 MCG/ACT AERO Inhale 2 Inhalers into the lungs in the morning and at bedtime.  . busPIRone (BUSPAR) 5 MG tablet Take 5 mg by mouth 2 (two) times daily.  . Calcium Carb-Cholecalciferol (CALTRATE 600+D3) 600-800 MG-UNIT TABS Take 1 tablet by mouth daily.  [name] docusate sodium (COLACE) 100 MG capsule Take 1 capsule (100 mg total) by mouth 2 (two) times daily.  [name] 25 MCG tablet Take 1 tablet by mouth once daily  . famotidine (PEPCID) 20 MG tablet Take 1 tablet (20 mg total) by mouth at bedtime.  . folic acid (FOLVITE) 1 MG tablet Take 1 tablet (1 mg total) by mouth daily.  . furosemide (LASIX) 40 MG tablet Take 1 tablet (40 mg total) by mouth daily.  . iron polysaccharides (NIFEREX) 150 MG capsule Take 1 capsule (150 mg total) by mouth daily.  [name] levalbuterol (XOPENEX) 1.25 MG/3ML nebulizer solution Take 1.25 mg by nebulization every 4 (four) hours as needed for wheezing. Dx J45.909  . memantine (NAMENDA) 10 MG tablet Take 1 tablet (10 mg total) by mouth 2 (two) times daily.  . metoprolol tartrate (LOPRESSOR) 25 MG tablet Take 1 tablet (25 mg total) by mouth 2 (two) times daily.  . NON FORMULARY Diet NAS  . pantoprazole (PROTONIX) 40 MG tablet Take 1 tablet by mouth twice daily  . potassium chloride (KLOR-CON) 10 MEQ tablet Take 2 tablets (20 mEq total) by mouth daily.   No facility-administered encounter medications on file as of 09/20/2020.     SIGNIFICANT DIAGNOSTIC EXAMS  PREVIOUS   08-13-20: ct head:  No acute abnormality. Atrophy and chronic microvascular ischemic disease.  08-13-20: ct angio of head and neck:  1. 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 2. 26  mL ischemic penumbra in the left MCA territory without evidence of a core infarct. 3.  Mild-to-moderate cervical carotid atherosclerosis without significant stenosis. 4. Aortic Atherosclerosis   08-14-20: carotid doppler: Color duplex indicates minimal heterogeneous and calcified plaque, with no hemodynamically significant stenosis by duplex criteria in the extracranial cerebrovascular circulation.  08-17-20: ct of chest; abdomen and pelvis Chest Impression: 1. Scattered peribronchial thickening in LEFT and RIGHT lung suggest mild pulmonary inflammation or infection. 2. Focus of consolidation with pleural fluid at the RIGHT lung base differential include pneumonia or round atelectasis.   Abdomen / Pelvis Impression: 1. Circumferential submucosal thickening over 6 cm segment of the ascending colon. 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 Recommend endoscopy for further evaluation. 2. No evidence of metastatic adenopathy other than a small retrocrural lymph node which is favored unrelated 3. No liver metastasis. 4. Multiple diverticula bladder.  NO NEW EXAMS.   LABS REVIEWED PREVIOUS   08-13-20; wbc 7.5; hgb 9.4; hct 31.8; mcv 95.8 plt 270; glucose 122; bun 24; creat 1.28; k+ 3.9; na++ 135; ca 8.7 liver normal albumin 2.9 08-14-20; hgb a1c 4.7; chol 134; ldl 66; trig 96; hdl 49 08-17-20: wbc 7.7; hgb 9.4; hct 30.4; mcv 94.4 plt 242; glucose 111; bun 17; creat 1.16; k+ 3.7; na++ 136; ca 8.9 liver normal albumin 2.7   TODAY   09-05-20: wbc 8.0; hgb 9.8; hct 31.5; mcv 94.6 plt 245; glucose 123; bun 22; creat 1.55; k+ 4.1; na++ 131; ca 8.5 GFR 21 liver normal albumin 2.9; iron 26; ferritin 222; vit B 12: >7500 09-17-20: tsh 1.637    Review of Systems  Unable to perform ROS: Dementia (unable to participate )    Physical Exam Constitutional:      General: She is not in acute distress.    Appearance: She is well-developed. She is not diaphoretic.  Neck:     Thyroid: No thyromegaly.   Cardiovascular:     Rate and Rhythm: Normal rate and regular rhythm.     Heart sounds: Normal heart sounds.     Comments: Psychologist, forensic in place Pulmonary:     Effort: Pulmonary effort is normal. No respiratory distress.     Breath sounds: Normal breath sounds.  Abdominal:     General: Bowel sounds are normal. There is no distension.     Palpations: Abdomen is soft.     Tenderness: There is no abdominal tenderness.  Musculoskeletal:        General: Normal range of motion.     Cervical back: Neck supple.     Right lower leg: No edema.     Left lower leg: No edema.  Lymphadenopathy:     Cervical: No cervical adenopathy.  Skin:    General: Skin is warm and dry.  Neurological:     Mental Status: She is alert. Mental status is at baseline.  Psychiatric:        Mood and Affect: Mood normal.     ASSESSMENT/ PLAN:  TODAY  1. Aortic atherosclerosis: (ct 08-17-20) will monitor   2. Chronic diastolic congestive heart failure: is stable EF 60-65% (08-16-20) will continue lasix 40 mg daily with k+ 20 meq daily   3. Cerebral vascular accident (CVA) unspecified mechanism: is neurologically stable will monitor   4.  PAF(paraxysmal atrial fibrillation) heart rate is stable is status post pace maker: will continue lopressor 25 mg twice daily for rate control  5. 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: is stable b/p 138/67  Will continue lopressor 25 mg twice daily  6. Pulmonary emphysema unspecified emphysema type: is stable will continue breztri aerosphere 160-9-4.8 mcg 2 puffs twice daily; albuterol 2 puffs every 4 hours as needed xopenex 1.25 mg neb every 4 hours as needed  7.  Malignant neoplasm of colon unspecified part of colon: due to advanced age and dementia; no aggressive workup or treatment.   8. gastroesophageal reflux disease without esophagitis: is stable will continue protonix 40 mg daily twice  daily  9. Alzheimer's disease without behavioral disturbance unspecified age of onset: is without change weight is 104 pounds will monitor   10. Moderate protein calorie malnutrition: is without change albumin 2.9 will continue supplements as directed  11. Iron deficiency anemia due to chronic blood loss: is stable hgb 9.8 will continue niferex daily   12. Chronic constipation: is stable will continue colace twice daily   13. Vitamin B 12 deficiency: is stable level is >7500 will stop monthly injections at this time will monitor  14. 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  Contact [phone] Monday through Friday 8am- 5pm  After hours call [phone]

## 2020-09-23 ENCOUNTER — Encounter (HOSPITAL_COMMUNITY): Payer: Self-pay

## 2020-09-23 ENCOUNTER — Inpatient Hospital Stay (HOSPITAL_COMMUNITY): Payer: Medicare Other | Attending: Oncology

## 2020-09-23 VITALS — BP 113/50 | HR 66 | Temp 97.7°F | Resp 18

## 2020-09-23 DIAGNOSIS — I129 Hypertensive chronic kidney disease with stage 1 through stage 4 chronic kidney disease, or unspecified chronic kidney disease: Secondary | ICD-10-CM | POA: Diagnosis not present

## 2020-09-23 DIAGNOSIS — D500 Iron deficiency anemia secondary to blood loss (chronic): Secondary | ICD-10-CM

## 2020-09-23 DIAGNOSIS — D631 Anemia in chronic kidney disease: Secondary | ICD-10-CM | POA: Insufficient documentation

## 2020-09-23 DIAGNOSIS — Z79899 Other long term (current) drug therapy: Secondary | ICD-10-CM | POA: Insufficient documentation

## 2020-09-23 MED ORDER — ACETAMINOPHEN 325 MG PO TABS
650.0000 mg | ORAL_TABLET | Freq: Once | ORAL | Status: AC
  Administered 2020-09-23: 650 mg via ORAL

## 2020-09-23 MED ORDER — ACETAMINOPHEN 325 MG PO TABS
ORAL_TABLET | ORAL | Status: AC
  Filled 2020-09-23: qty 2

## 2020-09-23 MED ORDER — SODIUM CHLORIDE 0.9 % IV SOLN
510.0000 mg | Freq: Once | INTRAVENOUS | Status: AC
  Administered 2020-09-23: 510 mg via INTRAVENOUS
  Filled 2020-09-23: qty 510

## 2020-09-23 MED ORDER — FAMOTIDINE 20 MG PO TABS
20.0000 mg | ORAL_TABLET | Freq: Once | ORAL | Status: AC
  Administered 2020-09-23: 20 mg via ORAL

## 2020-09-23 MED ORDER — SODIUM CHLORIDE 0.9 % IV SOLN: Freq: Once | INTRAVENOUS | Status: AC

## 2020-09-23 MED ORDER — FAMOTIDINE 20 MG PO TABS
ORAL_TABLET | ORAL | Status: AC
  Filled 2020-09-23: qty 1

## 2020-09-23 MED ORDER — LORATADINE 10 MG PO TABS
10.0000 mg | ORAL_TABLET | Freq: Once | ORAL | Status: AC
  Administered 2020-09-23: 10 mg via ORAL

## 2020-09-23 MED ORDER — LORATADINE 10 MG PO TABS
ORAL_TABLET | ORAL | Status: AC
  Filled 2020-09-23: qty 1

## 2020-09-23 MED ORDER — CYANOCOBALAMIN 1000 MCG/ML IJ SOLN: 1000.0000 ug | Freq: Once | INTRAMUSCULAR | Status: DC

## 2020-09-23 NOTE — Progress Notes (Signed)
Tolerated infusion w/o adverse reaction.  Alert, in no distress.  VSS.  Discharged via wheelchair in [place] staff in stable condition.

## 2020-09-23 NOTE — Patient Instructions (Signed)
Feraheme infusion today. Return as scheduled for Feraheme infusion. Return as scheduled for lab work.

## 2020-09-26 ENCOUNTER — Ambulatory Visit: Payer: Medicare Other | Admitting: Cardiology

## 2020-09-30 ENCOUNTER — Inpatient Hospital Stay (HOSPITAL_COMMUNITY): Payer: Medicare Other

## 2020-09-30 VITALS — BP 140/55 | HR 80 | Temp 97.0°F | Resp 16

## 2020-09-30 DIAGNOSIS — Z79899 Other long term (current) drug therapy: Secondary | ICD-10-CM | POA: Diagnosis not present

## 2020-09-30 DIAGNOSIS — I129 Hypertensive chronic kidney disease with stage 1 through stage 4 chronic kidney disease, or unspecified chronic kidney disease: Secondary | ICD-10-CM | POA: Diagnosis not present

## 2020-09-30 DIAGNOSIS — D631 Anemia in chronic kidney disease: Secondary | ICD-10-CM | POA: Diagnosis not present

## 2020-09-30 DIAGNOSIS — D500 Iron deficiency anemia secondary to blood loss (chronic): Secondary | ICD-10-CM

## 2020-09-30 MED ORDER — LORATADINE 10 MG PO TABS
10.0000 mg | ORAL_TABLET | Freq: Once | ORAL | Status: AC
  Administered 2020-09-30: 10 mg via ORAL
  Filled 2020-09-30: qty 1

## 2020-09-30 MED ORDER — SODIUM CHLORIDE 0.9 % IV SOLN: Freq: Once | INTRAVENOUS | Status: AC

## 2020-09-30 MED ORDER — SODIUM CHLORIDE 0.9 % IV SOLN
510.0000 mg | Freq: Once | INTRAVENOUS | Status: AC
  Administered 2020-09-30: 510 mg via INTRAVENOUS
  Filled 2020-09-30: qty 510

## 2020-09-30 MED ORDER — ACETAMINOPHEN 325 MG PO TABS
650.0000 mg | ORAL_TABLET | Freq: Once | ORAL | Status: AC
  Administered 2020-09-30: 650 mg via ORAL
  Filled 2020-09-30: qty 2

## 2020-09-30 MED ORDER — FAMOTIDINE 20 MG PO TABS
20.0000 mg | ORAL_TABLET | Freq: Once | ORAL | Status: AC
  Administered 2020-09-30: 20 mg via ORAL
  Filled 2020-09-30: qty 1

## 2020-09-30 NOTE — Patient Instructions (Signed)
[facility] at [facility]  Discharge Instructions:   _______________________________________________________________  Thank you for choosing [facility] at [facility] to provide your oncology and hematology care.  To afford each patient quality time with our providers, please arrive at least 15 minutes before your scheduled appointment.  You need to re-schedule your appointment if you arrive 10 or more minutes late.  We strive to give you quality time with our providers, and arriving late affects you and other patients whose appointments are after yours.  Also, if you no show three or more times for appointments you may be dismissed from the clinic.  Again, thank you for choosing [facility] at [facility]. Our hope is that these requests will allow you access to exceptional care and in a timely manner. _______________________________________________________________  If you have questions after your visit, please contact our office at [phone] between the hours of 8:30 a.m. and 5:00 p.m. Voicemails left after 4:30 p.m. will not be returned until the following business day. _______________________________________________________________  For prescription refill requests, have your pharmacy contact our office. _______________________________________________________________  Recommendations made by the consultant and any test results will be sent to your referring physician. _______________________________________________________________ 

## 2020-09-30 NOTE — Progress Notes (Signed)
Iron infusion given per orders. Patient tolerated it well without problems. Vitals stable and discharged home from clinic via wheelchair with NT from [facility].  Follow up as scheduled.

## 2020-10-03 ENCOUNTER — Encounter: Payer: Self-pay | Admitting: Adult Health

## 2020-10-03 ENCOUNTER — Non-Acute Institutional Stay (SKILLED_NURSING_FACILITY): Payer: Medicare Other | Admitting: Adult Health

## 2020-10-03 DIAGNOSIS — I130 Hypertensive heart and chronic kidney disease with heart failure and stage 1 through stage 4 chronic kidney disease, or unspecified chronic kidney disease: Secondary | ICD-10-CM | POA: Diagnosis not present

## 2020-10-03 DIAGNOSIS — N1832 Chronic kidney disease, stage 3b: Secondary | ICD-10-CM | POA: Diagnosis not present

## 2020-10-03 DIAGNOSIS — I5032 Chronic diastolic (congestive) heart failure: Secondary | ICD-10-CM

## 2020-10-03 NOTE — Progress Notes (Signed)
Location:  [facility] Room Number: 142/D Place of Service:  SNF (31)   CODE STATUS: DNR  Allergies  Allergen Reactions  . Hctz [Hydrochlorothiazide] Other (See Comments)    Pt was ill and this affected her kidneys   . Aspirin Other (See Comments)    Cardiologist said the patient is to not take this  . Codeine Other (See Comments)    Made the patient feel ill, has not had any problems since 1977    Chief Complaint  Patient presents with  . Acute Visit    Blood Pressure     HPI:  Her blood pressure reading have been variable from 102-149/68-79. There are no reports of uncontrolled pain; no headaches; no dizziness. There are no reports of missed medications.   Past Medical History:  Diagnosis Date  . Acute CVA (cerebrovascular accident) ([place]) 08/27/2019  . Acute GI bleeding 06/22/2020  . Acute renal failure superimposed on stage 3b chronic kidney disease ([place])   . AKI (acute kidney injury) ([place])   . Anemia    years ago  . Angio-edema, initial encounter 08/23/2019  . Aortic stenosis   . Arthritis   . Asthma   . Atrial fibrillation ([place])   . Blood transfusion without reported diagnosis   . CHF (congestive heart failure) ([place]) 11/2014  . CKD (chronic kidney disease)    had many uti this year   . Closed left hip fracture, initial encounter ([place]) 03/09/2020  . Dementia ([place])   . Family history of adverse reaction to anesthesia    2 daughters would have N/V  . Gastric AVM   . GERD (gastroesophageal reflux disease)    was seen by Gi for bleeding ulcer which was cauterized   . GI bleed   . Glaucoma   . Hearing loss   . Heart murmur    Rheumatic fever whe she was young,  sees Dr. Stanford Breed in [place]  . HTN (hypertension)   . Hypokalemia   . Myocardial infarction ([place])    in her 35 when she had two heart attacks   . PNA (pneumonia) 08/25/2019  . Pneumonia    stayed from friday to sunday in august  . Presence of permanent cardiac pacemaker     placed about 4 years ago  . Prolapsing mitral leaflet syndrome   . Shortness of breath dyspnea    with exertion  . Stroke ([place])   . SVT (supraventricular tachycardia) ([facility])    S/P ablation of AVNRT in 2003    Past Surgical History:  Procedure Laterality Date  . APPENDECTOMY    . BIOPSY  04/07/2018   Procedure: BIOPSY;  Surgeon: Jerene Bears, MD;  Location: [facility] ENDOSCOPY;  Service: Gastroenterology;;  . BIOPSY  02/19/2020   Procedure: BIOPSY;  Surgeon: Harvel Quale, MD;  [facility] SU[facility];  Service: Gastroenterology;;  gastric   . BLADDER SURGERY    . CARDIAC CATHETERIZATION N/A 09/26/2015   Procedure: Right/Left Heart Cath and Coronary Angiography;  Surgeon: Burnell Blanks, MD;  Location: [facility] CV LAB;  Service: Cardiovascular;  Laterality: N/A;  . CARDIAC SURGERY    . CARDIOVERSION N/A 03/02/2016   Procedure: CARDIOVERSION;  Surgeon: Thayer Headings, MD;  Location: [facility];  Service: Cardiovascular;  Laterality: N/A;  . ENTEROSCOPY  02/19/2020   Procedure: ENTEROSCOPY;  Surgeon: Harvel Quale, MD;  [facility];  Service: Gastroenterology;;  . EP IMPLANTABLE DEVICE N/A 10/26/2015   Procedure: Pacemaker Implant;  Surgeon: Will Meredith Leeds, MD;  Location: [facility] CV LAB;  Service: Cardiovascular;  Laterality: N/A;  . ESOPHAGOGASTRODUODENOSCOPY (EGD) WITH PROPOFOL N/A 04/07/2018   Procedure: ESOPHAGOGASTRODUODENOSCOPY (EGD) WITH PROPOFOL;  Surgeon: Jerene Bears, MD;  Location: [facility] ENDOSCOPY;  Service: Gastroenterology;  Laterality: N/A;  . ESOPHAGOGASTRODUODENOSCOPY (EGD) WITH PROPOFOL N/A 10/12/2019   Procedure: ESOPHAGOGASTRODUODENOSCOPY (EGD) WITH PROPOFOL;  Surgeon: Danie Binder, MD;  [facility];  Service: Endoscopy;  Laterality: N/A;  . EYE SURGERY Bilateral    cataract surgery  . FLEXIBLE SIGMOIDOSCOPY  02/19/2020   Procedure: FLEXIBLE SIGMOIDOSCOPY;  Surgeon: Harvel Quale, MD;  [facility]  END[facility];  Service: Gastroenterology;;  . HIP FRACTURE SURGERY Left 02/2020  . HOT HEMOSTASIS N/A 04/07/2018   Procedure: HOT HEMOSTASIS (ARGON PLASMA COAGULATION/BICAP);  Surgeon: Jerene Bears, MD;  Location: [facility] ENDOSCOPY;  Service: Gastroenterology;  Laterality: N/A;  . HOT HEMOSTASIS  10/12/2019   Procedure: HOT HEMOSTASIS (ARGON PLASMA COAGULATION/BICAP);  Surgeon: Danie Binder, MD;  [facility];  Service: Endoscopy;;  . INTRAMEDULLARY (IM) NAIL INTERTROCHANTERIC Left 03/10/2020   Procedure: OPEN TREATMENT INTERNAL FIXATION LEFT HIP (WITH GAMMA NAIL);  Surgeon: Carole Civil, MD;  Location: [facility] ORS;  Service: Orthopedics;  Laterality: Left;  . NASAL SINUS SURGERY    . PACEMAKER INSERTION    . TEE WITHOUT CARDIOVERSION N/A 10/25/2015   Procedure: TRANSESOPHAGEAL ECHOCARDIOGRAM (TEE);  Surgeon: Burnell Blanks, MD;  Location: [facility];  Service: Open Heart Surgery;  Laterality: N/A;  . TRANSCATHETER AORTIC VALVE REPLACEMENT, TRANSFEMORAL Right 10/25/2015   Procedure: TRANSCATHETER AORTIC VALVE REPLACEMENT, TRANSFEMORAL;  Surgeon: Burnell Blanks, MD;  Location: [facility];  Service: Open Heart Surgery;  Laterality: Right;    Social History   Socioeconomic History  . Marital status: Widowed    Spouse name: Not on file  . Number of children: 3  . Years of education: Not on file  . Highest education level: 12th grade  Occupational History  . Occupation: Retired  Tobacco Use  . Smoking status: Never Smoker  . Smokeless tobacco: Never Used  Vaping Use  . Vaping Use: Never used  Substance and Sexual Activity  . Alcohol use: No    Alcohol/week: 0.0 standard drinks  . Drug use: No  . Sexual activity: Not Currently  Other Topics Concern  . Not on file  Social History Narrative  . Not on file   Social Determinants of Health   Financial Resource Strain: Not on file  Food Insecurity: Not on file  Transportation Needs: Not on file  Physical Activity: Not on  file  Stress: Not on file  Social Connections: Not on file  Intimate Partner Violence: Not on file   Family History  Problem Relation Age of Onset  . Hypertension Mother   . Pneumonia Father   . Stomach cancer Brother   . Stroke Brother   . AAA (abdominal aortic aneurysm) Brother   . Cervical cancer Daughter   . Heart attack Neg Hx   . Colon cancer Neg Hx   . Esophageal cancer Neg Hx       VITAL SIGNS BP (!) 149/68   Pulse 90   Temp 98.2 F (36.8 C)   Ht 5\' 1"  (1.549 m)   Wt 104 lb 3.2 oz (47.3 kg)   BMI 19.69 kg/m   Outpatient Encounter Medications as of 10/03/2020  Medication Sig  . acetaminophen (TYLENOL) 325 MG tablet Take 2 tablets (650 mg total) by  mouth every 4 (four) hours as needed for mild pain (or temp > 37.5 C (99.5 F)).  [name] albuterol (PROAIR HFA) 108 (90 Base) MCG/ACT inhaler Inhale 2 puffs into the lungs every 4 (four) hours as needed for wheezing or shortness of breath.  Roseanne Kaufman [place]-Castor Oil OINT Apply 1 application topically daily. Apply to sacrum and bilateral buttocks every shift  . Biotin 10 MG CAPS Take 10 mg by mouth daily.   . Budeson-Glycopyrrol-Formoterol (BREZTRI AEROSPHERE) 160-9-4.8 MCG/ACT AERO Inhale 2 Inhalers into the lungs in the morning and at bedtime.  . busPIRone (BUSPAR) 5 MG tablet Take 5 mg by mouth 2 (two) times daily.  . Calcium Carb-Cholecalciferol (CALTRATE 600+D3) 600-800 MG-UNIT TABS Take 1 tablet by mouth daily.  . carbamide peroxide (DEBROX) 6.5 % OTIC solution Place 3 drops into both ears 2 (two) times daily.  [name] docusate sodium (COLACE) 100 MG capsule Take 1 capsule (100 mg total) by mouth 2 (two) times daily.  . famotidine (PEPCID) 20 MG tablet Take 1 tablet (20 mg total) by mouth at bedtime.  . feeding supplement (ENSURE ENLIVE / ENSURE PLUS) LIQD Take 237 mLs by mouth 2 (two) times daily between meals.  . folic acid (FOLVITE) 1 MG tablet Take 1 tablet (1 mg total) by mouth daily.  . furosemide (LASIX) 40 MG tablet Take 1  tablet (40 mg total) by mouth daily.  . iron polysaccharides (NIFEREX) 150 MG capsule Take 1 capsule (150 mg total) by mouth daily.  [name] levalbuterol (XOPENEX) 1.25 MG/3ML nebulizer solution Take 1.25 mg by nebulization every 4 (four) hours as needed for wheezing. Dx J45.909  . levothyroxine (EUTHYROX) 25 MCG tablet Take 25 mcg by mouth daily before breakfast.  . memantine (NAMENDA) 10 MG tablet Take 1 tablet (10 mg total) by mouth 2 (two) times daily.  . metoprolol tartrate (LOPRESSOR) 12.5 mg TABS tablet Take 12.5 mg by mouth 2 (two) times daily.  . NON FORMULARY Diet NAS  . pantoprazole (PROTONIX) 40 MG tablet Take 1 tablet by mouth twice daily  . potassium chloride (KLOR-CON) 10 MEQ tablet Take 2 tablets (20 mEq total) by mouth daily.  . [DISCONTINUED] busPIRone (BUSPAR) 5 MG tablet Take 5 mg by mouth 2 (two) times daily.  . [DISCONTINUED] EUTHYROX 25 MCG tablet Take 1 tablet by mouth once daily  . [DISCONTINUED] metoprolol tartrate (LOPRESSOR) 25 MG tablet Take 1 tablet (25 mg total) by mouth 2 (two) times daily.   No facility-administered encounter medications on file as of 10/03/2020.     SIGNIFICANT DIAGNOSTIC EXAMS   PREVIOUS   08-13-20: ct head:  No acute abnormality. Atrophy and chronic microvascular ischemic disease.  08-13-20: ct angio of head and neck:  1. 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 2. 26 mL ischemic penumbra in the left MCA territory without evidence of a core infarct. 3. Mild-to-moderate cervical carotid atherosclerosis without significant stenosis. 4. Aortic Atherosclerosis   08-14-20: carotid doppler: Color duplex indicates minimal heterogeneous and calcified plaque, with no hemodynamically significant stenosis by duplex criteria in the extracranial cerebrovascular circulation.  08-17-20: ct of chest; abdomen and pelvis Chest Impression: 1. Scattered peribronchial thickening in LEFT and RIGHT lung suggest mild pulmonary inflammation or  infection. 2. Focus of consolidation with pleural fluid at the RIGHT lung base differential include pneumonia or round atelectasis.   Abdomen / Pelvis Impression: 1. Circumferential submucosal thickening over 6 cm segment of the ascending colon. 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 Recommend endoscopy for further evaluation. 2. No  evidence of metastatic adenopathy other than a small retrocrural lymph node which is favored unrelated 3. No liver metastasis. 4. Multiple diverticula bladder.  NO NEW EXAMS.   LABS REVIEWED PREVIOUS   08-13-20; wbc 7.5; hgb 9.4; hct 31.8; mcv 95.8 plt 270; glucose 122; bun 24; creat 1.28; k+ 3.9; na++ 135; ca 8.7 liver normal albumin 2.9 08-14-20; hgb a1c 4.7; chol 134; ldl 66; trig 96; hdl 49 08-17-20: wbc 7.7; hgb 9.4; hct 30.4; mcv 94.4 plt 242; glucose 111; bun 17; creat 1.16; k+ 3.7; na++ 136; ca 8.9 liver normal albumin 2.7  09-05-20: wbc 8.0; hgb 9.8; hct 31.5; mcv 94.6 plt 245; glucose 123; bun 22; creat 1.55; k+ 4.1; na++ 131; ca 8.5 GFR 21 liver normal albumin 2.9; iron 26; ferritin 222; vit B 12: >7500 09-17-20: tsh 1.637   NO NEW LABS.    Review of Systems  Unable to perform ROS: Dementia (unable to participate )    Physical Exam Constitutional:      General: She is not in acute distress.    Appearance: She is well-developed. She is not diaphoretic.  Neck:     Thyroid: No thyromegaly.  Cardiovascular:     Rate and Rhythm: Normal rate and regular rhythm.     Heart sounds: Normal heart sounds.     Comments: Psychologist, forensic in place Pulmonary:     Effort: Pulmonary effort is normal. No respiratory distress.     Breath sounds: Normal breath sounds.  Abdominal:     General: Bowel sounds are normal. There is no distension.     Palpations: Abdomen is soft.     Tenderness: There is no abdominal tenderness.  Musculoskeletal:        General: Normal range of motion.     Right lower leg: No edema.     Left lower leg: No edema.   Lymphadenopathy:     Cervical: No cervical adenopathy.  Skin:    General: Skin is warm and dry.  Neurological:     Mental Status: She is alert. Mental status is at baseline.  Psychiatric:        Mood and Affect: Mood normal.       ASSESSMENT/ PLAN:  TODAY  1. 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 And will make further changes as indicated.     Ok Edwards NP [facility]  Contact [phone] Monday through Friday 8am- 5pm  After hours call [phone]

## 2020-10-05 DIAGNOSIS — Z23 Encounter for immunization: Secondary | ICD-10-CM | POA: Diagnosis not present

## 2020-10-14 ENCOUNTER — Encounter: Payer: Self-pay | Admitting: Adult Health

## 2020-10-14 ENCOUNTER — Non-Acute Institutional Stay (SKILLED_NURSING_FACILITY): Payer: Medicare Other | Admitting: Adult Health

## 2020-10-14 DIAGNOSIS — I700 Atherosclerosis of aorta: Secondary | ICD-10-CM | POA: Diagnosis not present

## 2020-10-14 DIAGNOSIS — I639 Cerebral infarction, unspecified: Secondary | ICD-10-CM | POA: Diagnosis not present

## 2020-10-14 DIAGNOSIS — I5032 Chronic diastolic (congestive) heart failure: Secondary | ICD-10-CM

## 2020-10-14 DIAGNOSIS — I480 Paroxysmal atrial fibrillation: Secondary | ICD-10-CM

## 2020-10-14 NOTE — Progress Notes (Signed)
Location:  [facility] Room Number: 142/D Place of Service:  SNF (31)   CODE STATUS: DNR  Allergies  Allergen Reactions  . Hctz [Hydrochlorothiazide] Other (See Comments)    Pt was ill and this affected her kidneys   . Aspirin Other (See Comments)    Cardiologist said the patient is to not take this  . Codeine Other (See Comments)    Made the patient feel ill, has not had any problems since 1977    Chief Complaint  Patient presents with  . Medical Management of Chronic Issues           Aortic atherosclerosis:  Chronic diastolic congestive heart failure:    Cerebral vascular accident (CVA) unspecified mechanism . PAF (paraxysmal atrial fibrillation)      HPI:  She is a 87 year old long term resident of this facility being seen for the management of her chronic illnesses: Aortic atherosclerosis:  Chronic diastolic congestive heart failure:    Cerebral vascular accident (CVA) unspecified mechanism . PAF (paraxysmal atrial fibrillation) .  There are no reports of uncontrolled pain; no reports of changes in appetite weight is stable.    Past Medical History:  Diagnosis Date  . Acute CVA (cerebrovascular accident) ([place]) 08/27/2019  . Acute GI bleeding 06/22/2020  . Acute renal failure superimposed on stage 3b chronic kidney disease ([place])   . AKI (acute kidney injury) ([place])   . Anemia    years ago  . Angio-edema, initial encounter 08/23/2019  . Aortic stenosis   . Arthritis   . Asthma   . Atrial fibrillation ([place])   . Blood transfusion without reported diagnosis   . CHF (congestive heart failure) ([place]) 11/2014  . CKD (chronic kidney disease)    had many uti this year   . Closed left hip fracture, initial encounter ([place]) 03/09/2020  . Dementia ([place])   . Family history of adverse reaction to anesthesia    2 daughters would have N/V  . Gastric AVM   . GERD (gastroesophageal reflux disease)    was seen by Gi for bleeding ulcer which was cauterized   . GI  bleed   . Glaucoma   . Hearing loss   . Heart murmur    Rheumatic fever whe she was young,  sees Dr. Stanford Breed in [place]  . HTN (hypertension)   . Hypokalemia   . Myocardial infarction ([place])    in her 20 when she had two heart attacks   . PNA (pneumonia) 08/25/2019  . Pneumonia    stayed from friday to sunday in august  . Presence of permanent cardiac pacemaker    placed about 4 years ago  . Prolapsing mitral leaflet syndrome   . Shortness of breath dyspnea    with exertion  . Stroke ( [place])   . SVT (supraventricular tachycardia) ([facility])    S/P ablation of AVNRT in 2003    Past Surgical History:  Procedure Laterality Date  . APPENDECTOMY    . BIOPSY  04/07/2018   Procedure: BIOPSY;  Surgeon: Jerene Bears, MD;  Location: [facility] ENDOSCOPY;  Service: Gastroenterology;;  . BIOPSY  02/19/2020   Procedure: BIOPSY;  Surgeon: Harvel Quale, MD;  [facility];  Service: Gastroenterology;;  gastric   . BLADDER SURGERY    . CARDIAC CATHETERIZATION N/A 09/26/2015   Procedure: Right/Left Heart Cath and Coronary Angiography;  Surgeon: Burnell Blanks, MD;  Location: [facility] CV LAB;  Service: Cardiovascular;  Laterality: N/A;  .  CARDIAC SURGERY    . CARDIOVERSION N/A 03/02/2016   Procedure: CARDIOVERSION;  Surgeon: Thayer Headings, MD;  Location: [facility] ENDOSCOPY;  Service: Cardiovascular;  Laterality: N/A;  . ENTEROSCOPY  02/19/2020   Procedure: ENTEROSCOPY;  Surgeon: Harvel Quale, MD;  [facility];  Service: Gastroenterology;;  . EP IMPLANTABLE DEVICE N/A 10/26/2015   Procedure: Pacemaker Implant;  Surgeon: Will Meredith Leeds, MD;  Location: [facility] CV LAB;  Service: Cardiovascular;  Laterality: N/A;  . ESOPHAGOGASTRODUODENOSCOPY (EGD) WITH PROPOFOL N/A 04/07/2018   Procedure: ESOPHAGOGASTRODUODENOSCOPY (EGD) WITH PROPOFOL;  Surgeon: Jerene Bears, MD;  Location: [facility] ENDOSCOPY;  Service: Gastroenterology;  Laterality: N/A;  .  ESOPHAGOGASTRODUODENOSCOPY (EGD) WITH PROPOFOL N/A 10/12/2019   Procedure: ESOPHAGOGASTRODUODENOSCOPY (EGD) WITH PROPOFOL;  Surgeon: Danie Binder, MD;  [facility];  Service: Endoscopy;  Laterality: N/A;  . EYE SURGERY Bilateral    cataract surgery  . FLEXIBLE SIGMOIDOSCOPY  02/19/2020   Procedure: FLEXIBLE SIGMOIDOSCOPY;  Surgeon: Harvel Quale, MD;  [facility];  Service: Gastroenterology;;  . HIP FRACTURE SURGERY Left 02/2020  . HOT HEMOSTASIS N/A 04/07/2018   Procedure: HOT HEMOSTASIS (ARGON PLASMA COAGULATION/BICAP);  Surgeon: Jerene Bears, MD;  Location: [facility] ENDOSCOPY;  Service: Gastroenterology;  Laterality: N/A;  . HOT HEMOSTASIS  10/12/2019   Procedure: HOT HEMOSTASIS (ARGON PLASMA COAGULATION/BICAP);  Surgeon: Danie Binder, MD;  [facility];  Service: Endoscopy;;  . INTRAMEDULLARY (IM) NAIL INTERTROCHANTERIC Left 03/10/2020   Procedure: OPEN TREATMENT INTERNAL FIXATION LEFT HIP (WITH GAMMA NAIL);  Surgeon: Carole Civil, MD;  Location: [facility] ORS;  Service: Orthopedics;  Laterality: Left;  . NASAL SINUS SURGERY    . PACEMAKER INSERTION    . TEE WITHOUT CARDIOVERSION N/A 10/25/2015   Procedure: TRANSESOPHAGEAL ECHOCARDIOGRAM (TEE);  Surgeon: Burnell Blanks, MD;  Location: [facility];  Service: Open Heart Surgery;  Laterality: N/A;  . TRANSCATHETER AORTIC VALVE REPLACEMENT, TRANSFEMORAL Right 10/25/2015   Procedure: TRANSCATHETER AORTIC VALVE REPLACEMENT, TRANSFEMORAL;  Surgeon: Burnell Blanks, MD;  Location: [facility];  Service: Open Heart Surgery;  Laterality: Right;    Social History   Socioeconomic History  . Marital status: Widowed    Spouse name: Not on file  . Number of children: 3  . Years of education: Not on file  . Highest education level: 12th grade  Occupational History  . Occupation: Retired  Tobacco Use  . Smoking status: Never Smoker  . Smokeless tobacco: Never Used  Vaping Use  . Vaping Use: Never used   Substance and Sexual Activity  . Alcohol use: No    Alcohol/week: 0.0 standard drinks  . Drug use: No  . Sexual activity: Not Currently  Other Topics Concern  . Not on file  Social History Narrative  . Not on file   Social Determinants of Health   Financial Resource Strain: Not on file  Food Insecurity: Not on file  Transportation Needs: Not on file  Physical Activity: Not on file  Stress: Not on file  Social Connections: Not on file  Intimate Partner Violence: Not on file   Family History  Problem Relation Age of Onset  . Hypertension Mother   . Pneumonia Father   . Stomach cancer Brother   . Stroke Brother   . AAA (abdominal aortic aneurysm) Brother   . Cervical cancer Daughter   . Heart attack Neg Hx   . Colon cancer Neg Hx   . Esophageal cancer Neg Hx  VITAL SIGNS BP (!) 130/50   Pulse (!) 106   Temp 98.4 F (36.9 C)   Resp (!) 22   Ht 5\' 1"  (1.549 m)   Wt 108 lb 6.4 oz (49.2 kg)   BMI 20.48 kg/m   Outpatient Encounter Medications as of 10/14/2020  Medication Sig  . acetaminophen (TYLENOL) 325 MG tablet Take 2 tablets (650 mg total) by mouth every 4 (four) hours as needed for mild pain (or temp > 37.5 C (99.5 F)).  [name] albuterol (PROAIR HFA) 108 (90 Base) MCG/ACT inhaler Inhale 2 puffs into the lungs every 4 (four) hours as needed for wheezing or shortness of breath.  Roseanne Kaufman [place]-Castor Oil OINT Apply 1 application topically daily. Apply to sacrum and bilateral buttocks every shift  . Biotin 10 MG CAPS Take 10 mg by mouth daily.   . Budeson-Glycopyrrol-Formoterol (BREZTRI AEROSPHERE) 160-9-4.8 MCG/ACT AERO Inhale 2 Inhalers into the lungs in the morning and at bedtime.  . busPIRone (BUSPAR) 5 MG tablet Take 5 mg by mouth 2 (two) times daily.  . Calcium Carb-Cholecalciferol (CALTRATE 600+D3) 600-800 MG-UNIT TABS Take 1 tablet by mouth daily.  [name] docusate sodium (COLACE) 100 MG capsule Take 1 capsule (100 mg total) by mouth 2 (two) times daily.  .  famotidine (PEPCID) 20 MG tablet Take 1 tablet (20 mg total) by mouth at bedtime.  . feeding supplement (ENSURE ENLIVE / ENSURE PLUS) LIQD Take 237 mLs by mouth 2 (two) times daily between meals.  . folic acid (FOLVITE) 1 MG tablet Take 1 tablet (1 mg total) by mouth daily.  . furosemide (LASIX) 40 MG tablet Take 1 tablet (40 mg total) by mouth daily.  . iron polysaccharides (NIFEREX) 150 MG capsule Take 1 capsule (150 mg total) by mouth daily.  [name] levalbuterol (XOPENEX) 1.25 MG/3ML nebulizer solution Take 1.25 mg by nebulization every 4 (four) hours as needed for wheezing. Dx J45.909  . levothyroxine (SYNTHROID) 25 MCG tablet Take 25 mcg by mouth daily before breakfast.  . memantine (NAMENDA) 10 MG tablet Take 1 tablet (10 mg total) by mouth 2 (two) times daily.  . metoprolol tartrate (LOPRESSOR) 12.5 mg TABS tablet Take 12.5 mg by mouth 2 (two) times daily.  . NON FORMULARY Diet NAS  . omeprazole (PRILOSEC) 40 MG capsule Take 40 mg by mouth in the morning and at bedtime.  . potassium chloride (KLOR-CON) 10 MEQ tablet Take 2 tablets (20 mEq total) by mouth daily.  . [DISCONTINUED] carbamide peroxide (DEBROX) 6.5 % OTIC solution Place 3 drops into both ears 2 (two) times daily.  . [DISCONTINUED] pantoprazole (PROTONIX) 40 MG tablet Take 1 tablet by mouth twice daily   No facility-administered encounter medications on file as of 10/14/2020.     SIGNIFICANT DIAGNOSTIC EXAMS   PREVIOUS   08-13-20: ct head:  No acute abnormality. Atrophy and chronic microvascular ischemic disease.  08-13-20: ct angio of head and neck:  1. 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 2. 26 mL ischemic penumbra in the left MCA territory without evidence of a core infarct. 3. Mild-to-moderate cervical carotid atherosclerosis without significant stenosis. 4. Aortic Atherosclerosis   08-14-20: carotid doppler: Color duplex indicates minimal heterogeneous and calcified plaque, with no hemodynamically  significant stenosis by duplex criteria in the extracranial cerebrovascular circulation.  08-17-20: ct of chest; abdomen and pelvis Chest Impression: 1. Scattered peribronchial thickening in LEFT and RIGHT lung suggest mild pulmonary inflammation or infection. 2. Focus of consolidation with pleural fluid at the RIGHT lung base  differential include pneumonia or round atelectasis.   Abdomen / Pelvis Impression: 1. Circumferential submucosal thickening over 6 cm segment of the ascending colon. 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 Recommend endoscopy for further evaluation. 2. No evidence of metastatic adenopathy other than a small retrocrural lymph node which is favored unrelated 3. No liver metastasis. 4. Multiple diverticula bladder.  NO NEW EXAMS.   LABS REVIEWED PREVIOUS   08-13-20; wbc 7.5; hgb 9.4; hct 31.8; mcv 95.8 plt 270; glucose 122; bun 24; creat 1.28; k+ 3.9; na++ 135; ca 8.7 liver normal albumin 2.9 08-14-20; hgb a1c 4.7; chol 134; ldl 66; trig 96; hdl 49 08-17-20: wbc 7.7; hgb 9.4; hct 30.4; mcv 94.4 plt 242; glucose 111; bun 17; creat 1.16; k+ 3.7; na++ 136; ca 8.9 liver normal albumin 2.7  09-05-20: wbc 8.0; hgb 9.8; hct 31.5; mcv 94.6 plt 245; glucose 123; bun 22; creat 1.55; k+ 4.1; na++ 131; ca 8.5 GFR 21 liver normal albumin 2.9; iron 26; ferritin 222; vit B 12: >7500 09-17-20: tsh 1.637   NO NEW LABS    Review of Systems  Unable to perform ROS: Dementia (unable to participate )     Physical Exam Constitutional:      General: She is not in acute distress.    Appearance: She is well-developed. She is not diaphoretic.  Neck:     Thyroid: No thyromegaly.  Cardiovascular:     Rate and Rhythm: Normal rate and regular rhythm.     Heart sounds: Normal heart sounds.     Comments: Pace maker  Pulmonary:     Effort: Pulmonary effort is normal. No respiratory distress.     Breath sounds: Normal breath sounds.  Abdominal:     General: Bowel sounds are normal.  There is no distension.     Palpations: Abdomen is soft.     Tenderness: There is no abdominal tenderness.  Musculoskeletal:        General: Normal range of motion.     Cervical back: Neck supple.     Right lower leg: No edema.     Left lower leg: No edema.  Lymphadenopathy:     Cervical: No cervical adenopathy.  Skin:    General: Skin is warm and dry.  Neurological:     Mental Status: She is alert. Mental status is at baseline.  Psychiatric:        Mood and Affect: Mood normal.     ASSESSMENT/ PLAN:  TODAY  1. Aortic atherosclerosis: (ct 08-17-20) will monitor   2. Chronic diastolic congestive heart failure: is stable EF 60-65% (08-16-20) will continue lasix 40 mg daily with k+ 20 meq daily   3. Cerebral vascular accident (CVA) unspecified mechanism is neurologically stable will monitor  4. PAF (paraxysmal atrial fibrillation) heart rate is stable; status post pace maker will continue lopressor 12.5 mg twice daily for rate control    PREVIOUS   5. 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: is stable b/p 130/50  Will continue lopressor 25 mg twice daily   6. Pulmonary emphysema unspecified emphysema type: is stable will continue breztri aerosphere 160-9-4.8 mcg 2 puffs twice daily; albuterol 2 puffs every 4 hours as needed xopenex 1.25 mg neb every 4 hours as needed  7.  Malignant neoplasm of colon unspecified part of colon: due to advanced age and dementia; no aggressive workup or treatment.   8. gastroesophageal reflux disease without esophagitis: is stable will continue  protonix 40 mg daily twice daily  9. Alzheimer's disease without behavioral disturbance unspecified age of onset: is without change weight is 104 pounds will monitor   10. Moderate protein calorie malnutrition: is without change albumin 2.9 will continue supplements as directed  11. Iron deficiency anemia due to chronic blood  loss: is stable hgb 9.8 will continue niferex daily   12. Chronic constipation: is stable will continue colace twice daily   13. Vitamin B 12 deficiency: is stable level is >7500 will monitor   14. 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

## 2020-10-16 ENCOUNTER — Encounter: Payer: Self-pay | Admitting: Cardiology

## 2020-10-16 NOTE — Progress Notes (Signed)
Cardiology Office Note  Date: 10/17/2020   ID: [place], Braun [birth year], MRN [id number]  PCP:  [name], MD  Cardiologist:  Rozann Lesches, MD Electrophysiologist:  None   Chief Complaint  Patient presents with  . Cardiac follow-up    History of Present Illness: Crystal Cooper is a 87 y.o. female former patient of Dr. Stanford Breed now presenting to establish follow-up with me.  I reviewed her records and updated the chart.  She was last seen in December 2021 by Mr. Reino Bellis.  She is here today with her daughter.  She resides at the [facility].  She has significant dementia, did not voice any specific complaints today, appeared comfortable seated in wheelchair.  She follows with Dr. Curt Bears, Medtronic pacemaker in place.  Device interrogation in March revealed normal function.  She has a history of recurrent GI bleed, previous duodenitis and duodenal AVM, has not been anticoagulated for this reason (taken off Eliquis).  CHA2DS2-VASc score is 7.  Echocardiogram from February of this year revealed LVEF 60 to 65%, moderately dilated left atrium, mitral annular calcification, and stable TAVR prosthesis with mean gradient 6 mmHg and no paravalvular leak.  She has chronic anemia in the setting of recurrent GI bleeding with iron deficiency, also CKD.  She undergoes iron infusions and follows with Dr. Delton Coombes.  Of note, she also has CT evidence of colon cancer that is being managed conservatively without further work-up.  I reviewed her medications which are outlined below.  Past Medical History:  Diagnosis Date  . AKI (acute kidney injury) ([place])   . Anemia   . Aortic stenosis    Status post TAVR 2017  . Arthritis   . Asthma   . Atrial fibrillation ([place])   . AVM (arteriovenous malformation) of small bowel, acquired   . CKD (chronic kidney disease) stage 3, GFR 30-59 ml/min (HCC)   . Colon cancer ([place])   . Dementia ([place])   . Essential hypertension   . GERD  (gastroesophageal reflux disease)   . GI bleed   . Glaucoma   . Hearing loss   . History of pneumonia   . Pacemaker    Medtronic  . Recurrent UTI   . Stroke ([place])   . SVT (supraventricular tachycardia) (HCC)    S/P ablation of AVNRT in 2003    Past Surgical History:  Procedure Laterality Date  . APPENDECTOMY    . BIOPSY  04/07/2018   Procedure: BIOPSY;  Surgeon: Jerene Bears, MD;  Location: [facility] ENDOSCOPY;  Service: Gastroenterology;;  . BIOPSY  02/19/2020   Procedure: BIOPSY;  Surgeon: Harvel Quale, MD;  [facility];  Service: Gastroenterology;;  gastric   . BLADDER SURGERY    . CARDIAC CATHETERIZATION N/A 09/26/2015   Procedure: Right/Left Heart Cath and Coronary Angiography;  Surgeon: Burnell Blanks, MD;  Location: [place] CV LAB;  Service: Cardiovascular;  Laterality: N/A;  . CARDIAC SURGERY    . CARDIOVERSION N/A 03/02/2016   Procedure: CARDIOVERSION;  Surgeon: Thayer Headings, MD;  Location[facility] ENDOSCOPY;  Service: Cardiovascular;  Laterality: N/A;  . ENTEROSCOPY  02/19/2020   Procedure: ENTEROSCOPY;  Surgeon: Harvel Quale, MD;  [facility]O [facility];  Service: Gastroenterology;;  . EP IMPLANTABLE DEVICE N/A 10/26/2015   Procedure: Pacemaker Implant;  Surgeon: Will Meredith Leeds, MD;  Location: [place] CV LAB;  Service: Cardiovascular;  Laterality: N/A;  . ESOPHAGOGASTRODUODENOSCOPY (EGD) WITH PROPOFOL N/A 04/07/2018   Procedure: ESOPHAGOGASTRODUODENOSCOPY (  EGD) WITH PROPOFOL;  Surgeon: Jerene Bears, MD;  Location: [place];  Service: Gastroenterology;  Laterality: N/A;  . ESOPHAGOGASTRODUODENOSCOPY (EGD) WITH PROPOFOL N/A 10/12/2019   Procedure: ESOPHAGOGASTRODUODENOSCOPY (EGD) WITH PROPOFOL;  Surgeon: Danie Binder, MD;  [facility];  Service: Endoscopy;  Laterality: N/A;  . EYE SURGERY Bilateral    cataract surgery  . FLEXIBLE SIGMOIDOSCOPY  02/19/2020   Procedure: FLEXIBLE SIGMOIDOSCOPY;  Surgeon:  Harvel Quale, MD;  [facility];  Service: Gastroenterology;;  . HIP FRACTURE SURGERY Left 02/2020  . HOT HEMOSTASIS N/A 04/07/2018   Procedure: HOT HEMOSTASIS (ARGON PLASMA COAGULATION/BICAP);  Surgeon: Jerene Bears, MD;  Location: [facility] ENDOSCOPY;  Service: Gastroenterology;  Laterality: N/A;  . HOT HEMOSTASIS  10/12/2019   Procedure: HOT HEMOSTASIS (ARGON PLASMA COAGULATION/BICAP);  Surgeon: Danie Binder, MD;  [facility];  Service: Endoscopy;;  . INTRAMEDULLARY (IM) NAIL INTERTROCHANTERIC Left 03/10/2020   Procedure: OPEN TREATMENT INTERNAL FIXATION LEFT HIP (WITH GAMMA NAIL);  Surgeon: Carole Civil, MD;  [facility]: [facility] ORS;  Service: Orthopedics;  Laterality: Left;  . NASAL SINUS SURGERY    . PACEMAKER INSERTION    . TEE WITHOUT CARDIOVERSION N/A 10/25/2015   Procedure: TRANSESOPHAGEAL ECHOCARDIOGRAM (TEE);  Surgeon: Burnell Blanks, MD;  Location: [place];  Service: Open Heart Surgery;  Laterality: N/A;  . TRANSCATHETER AORTIC VALVE REPLACEMENT, TRANSFEMORAL Right 10/25/2015   Procedure: TRANSCATHETER AORTIC VALVE REPLACEMENT, TRANSFEMORAL;  Surgeon: Burnell Blanks, MD;  Location: [place];  Service: Open Heart Surgery;  Laterality: Right;    Medications: Current Outpatient Medications on File Prior to Visit  Medication Sig Dispense Refill  . acetaminophen (TYLENOL) 325 MG tablet Take 2 tablets (650 mg total) by mouth every 4 (four) hours as needed for mild pain (or temp > 37.5 C (99.5 F)).    [name] albuterol (PROAIR HFA) 108 (90 Base) MCG/ACT inhaler Inhale 2 puffs into the lungs every 4 (four) hours as needed for wheezing or shortness of breath. 18 g 11  . Balsam [place]-Castor Oil OINT Apply 1 application topically daily. Apply to sacrum and bilateral buttocks every shift    . Biotin 10 MG CAPS Take 10 mg by mouth daily.     . Budeson-Glycopyrrol-Formoterol (BREZTRI AEROSPHERE) 160-9-4.8 MCG/ACT AERO Inhale 2 Inhalers into the lungs in the  morning and at bedtime. 5.9 g 1  . busPIRone (BUSPAR) 5 MG tablet Take 5 mg by mouth 2 (two) times daily.    . Calcium Carb-Cholecalciferol (CALTRATE 600+D3) 600-800 MG-UNIT TABS Take 1 tablet by mouth daily.    [name] docusate sodium (COLACE) 100 MG capsule Take 1 capsule (100 mg total) by mouth 2 (two) times daily.    . famotidine (PEPCID) 20 MG tablet Take 1 tablet (20 mg total) by mouth at bedtime. 90 tablet 1  . feeding supplement (ENSURE ENLIVE / ENSURE PLUS) LIQD Take 237 mLs by mouth 2 (two) times daily between meals.    . folic acid (FOLVITE) 1 MG tablet Take 1 tablet (1 mg total) by mouth daily. 30 tablet 6  . furosemide (LASIX) 40 MG tablet Take 1 tablet (40 mg total) by mouth daily.    . iron polysaccharides (NIFEREX) 150 MG capsule Take 1 capsule (150 mg total) by mouth daily. 30 capsule 3  . levalbuterol (XOPENEX) 1.25 MG/3ML nebulizer solution Take 1.25 mg by nebulization every 4 (four) hours as needed for wheezing. Dx J45.909 72 mL 12  . levothyroxine (SYNTHROID) 25 MCG tablet Take  25 mcg by mouth daily before breakfast.    . memantine (NAMENDA) 10 MG tablet Take 1 tablet (10 mg total) by mouth 2 (two) times daily. 180 tablet 3  . metoprolol tartrate (LOPRESSOR) 12.5 mg TABS tablet Take 12.5 mg by mouth 2 (two) times daily.    . NON FORMULARY Diet NAS    . omeprazole (PRILOSEC) 40 MG capsule Take 40 mg by mouth in the morning and at bedtime.    . potassium chloride (KLOR-CON) 10 MEQ tablet Take 2 tablets (20 mEq total) by mouth daily. 60 tablet 2   No current facility-administered medications on file prior to visit.    Allergies:  Hctz [hydrochlorothiazide], Aspirin, and Codeine   ROS: Limited by dementia.  Physical Exam: VS:  BP (!) 106/52   Pulse 67   Wt 108 lb (49 kg)   SpO2 99%   BMI 20.41 kg/m , BMI Body mass index is 20.41 kg/m.  Wt Readings from Last 3 Encounters:  10/17/20 108 lb (49 kg)  10/14/20 108 lb 6.4 oz (49.2 kg)  10/03/20 104 lb 3.2 oz (47.3 kg)     General: Elderly woman seated in wheelchair, appears comfortable at rest. HEENT: Conjunctiva and lids normal, wearing a mask. Neck: Supple, no elevated JVP or carotid bruits, no thyromegaly. Lungs: Clear to auscultation, nonlabored breathing at rest. Cardiac: Regular rate and rhythm, no S3, soft systolic murmur, no pericardial rub. Extremities: No pitting edema.  ECG:  An ECG dated 08/17/2020 was personally reviewed today and demonstrated:  Ventricular pacing with underlying atrial fibrillation.  Recent Labwork: 02/12/2020: B Natriuretic Peptide 313.0 09/05/2020: ALT 22; AST 31; BUN 22; Creatinine, Ser 1.55; Hemoglobin 9.8; Platelets 245; Potassium 4.1; Sodium 131 09/17/2020: TSH 1.637     Component Value Date/Time   CHOL 134 08/14/2020 0651   CHOL 129 09/29/2019 1021   TRIG 96 08/14/2020 0651   HDL 49 08/14/2020 0651   HDL 59 09/29/2019 1021   CHOLHDL 2.7 08/14/2020 0651   VLDL 19 08/14/2020 0651   LDLCALC 66 08/14/2020 0651   LDLCALC 51 09/29/2019 1021    Other Studies Reviewed Today:  Echocardiogram 08/14/2020: 1. Left ventricular ejection fraction, by estimation, is 60 to 65%. The  left ventricle has normal function. The left ventricle has no regional  wall motion abnormalities. There is mild left ventricular hypertrophy.  Left ventricular diastolic parameters  are indeterminate.  2. Right ventricular systolic function is normal. The right ventricular  size is normal. There is normal pulmonary artery systolic pressure. The  estimated right ventricular systolic pressure is 36.1 mmHg.  3. Left atrial size was moderately dilated.  4. Right atrial size was severely dilated.  5. The mitral valve is abnormal. No evidence of mitral valve  regurgitation. Moderate mitral annular calcification.  6. There is a 29 mm Sapien prosthetic, stented (TAVR) valve present in  the aortic position. Aortic valve regurgitation is not visualized. Echo  findings are consistent with normal  structure and function of the aortic  valve prosthesis. Aortic valve mean  gradient measures 6.0 mmHg.  7. The inferior vena cava is normal in size with greater than 50%  respiratory variability, suggesting right atrial pressure of 3 mmHg.   Carotid Dopplers 08/14/2020: IMPRESSION: Color duplex indicates minimal heterogeneous and calcified plaque, with no hemodynamically significant stenosis by duplex criteria in the extracranial cerebrovascular circulation.  Assessment and Plan:  1.  History of aortic stenosis status post TAVR in 2017.  Follow-up echocardiogram in February of this year  revealed normal prosthetic function with mean gradient 6 mmHg and no paravalvular leak.  Continue observation.  2.  Persistent atrial fibrillation with CHA2DS2-VASc score of 7.  Due to recurrent GI bleeding with persistent anemia, previously documented duodenal AVMs, she is not anticoagulated.  3.  Medtronic pacemaker in place with history of sinus node dysfunction.  We will try to transition her EP follow-up to Dr. Lovena Le here in [place].  Last device interrogation in March revealed normal function.  Medication Adjustments/Labs and Tests Ordered: Current medicines are reviewed at length with the patient today.  Concerns regarding medicines are outlined above.   Tests Ordered: No orders of the defined types were placed in this encounter.   Medication Changes: No orders of the defined types were placed in this encounter.   Disposition:  Follow up 6 months.  Signed, Satira Sark, MD, [facility] 10/17/2020 9:02 AM    [place] at [facility] [address] [place], [place] [address] Phone: [phone]; Fax: [phone]

## 2020-10-17 ENCOUNTER — Encounter: Payer: Self-pay | Admitting: Cardiology

## 2020-10-17 ENCOUNTER — Ambulatory Visit (INDEPENDENT_AMBULATORY_CARE_PROVIDER_SITE_OTHER): Payer: Medicare Other | Admitting: Cardiology

## 2020-10-17 VITALS — BP 106/52 | HR 67 | Wt 108.0 lb

## 2020-10-17 DIAGNOSIS — Z950 Presence of cardiac pacemaker: Secondary | ICD-10-CM

## 2020-10-17 DIAGNOSIS — Z952 Presence of prosthetic heart valve: Secondary | ICD-10-CM

## 2020-10-17 DIAGNOSIS — I4819 Other persistent atrial fibrillation: Secondary | ICD-10-CM

## 2020-10-17 DIAGNOSIS — I2510 Atherosclerotic heart disease of native coronary artery without angina pectoris: Secondary | ICD-10-CM

## 2020-10-17 NOTE — Patient Instructions (Signed)

## 2020-10-18 DIAGNOSIS — M2042 Other hammer toe(s) (acquired), left foot: Secondary | ICD-10-CM | POA: Diagnosis not present

## 2020-10-18 DIAGNOSIS — M79675 Pain in left toe(s): Secondary | ICD-10-CM | POA: Diagnosis not present

## 2020-10-18 DIAGNOSIS — M2041 Other hammer toe(s) (acquired), right foot: Secondary | ICD-10-CM | POA: Diagnosis not present

## 2020-10-18 DIAGNOSIS — R262 Difficulty in walking, not elsewhere classified: Secondary | ICD-10-CM | POA: Diagnosis not present

## 2020-10-18 DIAGNOSIS — M79674 Pain in right toe(s): Secondary | ICD-10-CM | POA: Diagnosis not present

## 2020-10-18 DIAGNOSIS — B351 Tinea unguium: Secondary | ICD-10-CM | POA: Diagnosis not present

## 2020-10-18 DIAGNOSIS — I739 Peripheral vascular disease, unspecified: Secondary | ICD-10-CM | POA: Diagnosis not present

## 2020-10-25 ENCOUNTER — Encounter: Payer: Self-pay | Admitting: Internal Medicine

## 2020-10-25 ENCOUNTER — Ambulatory Visit (INDEPENDENT_AMBULATORY_CARE_PROVIDER_SITE_OTHER): Payer: Medicare Other | Admitting: Internal Medicine

## 2020-10-25 VITALS — BP 90/56 | HR 77 | Ht 61.0 in | Wt 94.0 lb

## 2020-10-25 DIAGNOSIS — I2510 Atherosclerotic heart disease of native coronary artery without angina pectoris: Secondary | ICD-10-CM

## 2020-10-25 DIAGNOSIS — I4819 Other persistent atrial fibrillation: Secondary | ICD-10-CM | POA: Diagnosis not present

## 2020-10-25 DIAGNOSIS — Z950 Presence of cardiac pacemaker: Secondary | ICD-10-CM

## 2020-10-25 DIAGNOSIS — I495 Sick sinus syndrome: Secondary | ICD-10-CM | POA: Diagnosis not present

## 2020-10-25 NOTE — Progress Notes (Signed)
HPI Crystal Cooper is referred today by Dr. Curt Bears for ongoing followup of her PPM and atrial arrhythmias. She is a pleasant 87 yo woman with the above problems as well as HTN and diastolic heart failure. She has renal insufficiency as well. She had AS and underwent TAVR in 2017. She has a h/o AVM's.  Allergies  Allergen Reactions  . Hctz [Hydrochlorothiazide] Other (See Comments)    Pt was ill and this affected her kidneys   . Aspirin Other (See Comments)    Cardiologist said the patient is to not take this  . Codeine Other (See Comments)    Made the patient feel ill, has not had any problems since 1977     No current outpatient medications on file.   No current facility-administered medications for this visit.     Past Medical History:  Diagnosis Date  . AKI (acute kidney injury) ([place])   . Anemia   . Aortic stenosis    Status post TAVR 2017  . Arthritis   . Asthma   . Atrial fibrillation ([place])   . AVM (arteriovenous malformation) of small bowel, acquired   . CKD (chronic kidney disease) stage 3, GFR 30-59 ml/min (HCC)   . Colon cancer ([place])   . Dementia ([place])   . Essential hypertension   . GERD (gastroesophageal reflux disease)   . GI bleed   . Glaucoma   . Hearing loss   . History of pneumonia   . Pacemaker    Medtronic  . Recurrent UTI   . Stroke ([place])   . SVT (supraventricular tachycardia) (HCC)    S/P ablation of AVNRT in 2003    ROS:   All systems reviewed and negative except as noted in the HPI.   Past Surgical History:  Procedure Laterality Date  . APPENDECTOMY    . BIOPSY  04/07/2018   Procedure: BIOPSY;  Surgeon: Jerene Bears, MD;  Location: [facility] ENDOSCOPY;  Service: Gastroenterology;;  . BIOPSY  02/19/2020   Procedure: BIOPSY;  Surgeon: Harvel Quale, MD;  [facility];  Service: Gastroenterology;;  gastric   . BLADDER SURGERY    . CARDIAC CATHETERIZATION N/A 09/26/2015   Procedure: Right/Left Heart Cath and Coronary  Angiography;  Surgeon: Burnell Blanks, MD;  Location: [place] CV LAB;  Service: Cardiovascular;  Laterality: N/A;  . CARDIAC SURGERY    . CARDIOVERSION N/A 03/02/2016   Procedure: CARDIOVERSION;  Surgeon: Thayer Headings, MD;  Location: [facility] ENDOSCOPY;  Service: Cardiovascular;  Laterality: N/A;  . ENTEROSCOPY  02/19/2020   Procedure: ENTEROSCOPY;  Surgeon: Harvel Quale, MD;  [facility];  Service: Gastroenterology;;  . EP IMPLANTABLE DEVICE N/A 10/26/2015   Procedure: Pacemaker Implant;  Surgeon: Will Meredith Leeds, MD;  Location: [place] CV LAB;  Service: Cardiovascular;  Laterality: N/A;  . ESOPHAGOGASTRODUODENOSCOPY (EGD) WITH PROPOFOL N/A 04/07/2018   Procedure: ESOPHAGOGASTRODUODENOSCOPY (EGD) WITH PROPOFOL;  Surgeon: Jerene Bears, MD;  Location: [facility] ENDOSCOPY;  Service: Gastroenterology;  Laterality: N/A;  . ESOPHAGOGASTRODUODENOSCOPY (EGD) WITH PROPOFOL N/A 10/12/2019   Procedure: ESOPHAGOGASTRODUODENOSCOPY (EGD) WITH PROPOFOL;  Surgeon: Danie Binder, MD;  [facility];  Service: Endoscopy;  Laterality: N/A;  . EYE SURGERY Bilateral    cataract surgery  . FLEXIBLE SIGMOIDOSCOPY  02/19/2020   Procedure: FLEXIBLE SIGMOIDOSCOPY;  Surgeon: Harvel Quale, MD;  [facility];  Service: Gastroenterology;;  . HIP FRACTURE SURGERY Left 02/2020  . HOT HEMOSTASIS N/A  04/07/2018   Procedure: HOT HEMOSTASIS (ARGON PLASMA COAGULATION/BICAP);  Surgeon: Jerene Bears, MD;  Location: [facility] ENDOSCOPY;  Service: Gastroenterology;  Laterality: N/A;  . HOT HEMOSTASIS  10/12/2019   Procedure: HOT HEMOSTASIS (ARGON PLASMA COAGULATION/BICAP);  Surgeon: Danie Binder, MD;  [facility];  Service: Endoscopy;;  . INTRAMEDULLARY (IM) NAIL INTERTROCHANTERIC Left 03/10/2020   Procedure: OPEN TREATMENT INTERNAL FIXATION LEFT HIP (WITH GAMMA NAIL);  Surgeon: Carole Civil, MD;  [facility] ORS;  Service: Orthopedics;  Laterality: Left;   . NASAL SINUS SURGERY    . PACEMAKER INSERTION    . TEE WITHOUT CARDIOVERSION N/A 10/25/2015   Procedure: TRANSESOPHAGEAL ECHOCARDIOGRAM (TEE);  Surgeon: Burnell Blanks, MD;  Location: [place];  Service: Open Heart Surgery;  Laterality: N/A;  . TRANSCATHETER AORTIC VALVE REPLACEMENT, TRANSFEMORAL Right 10/25/2015   Procedure: TRANSCATHETER AORTIC VALVE REPLACEMENT, TRANSFEMORAL;  Surgeon: Burnell Blanks, MD;  Location: [place];  Service: Open Heart Surgery;  Laterality: Right;     Family History  Problem Relation Age of Onset  . Hypertension Mother   . Pneumonia Father   . Stomach cancer Brother   . Stroke Brother   . AAA (abdominal aortic aneurysm) Brother   . Cervical cancer Daughter   . Heart attack Neg Hx   . Colon cancer Neg Hx   . Esophageal cancer Neg Hx      Social History   Socioeconomic History  . Marital status: Widowed    Spouse name: Not on file  . Number of children: 3  . Years of education: Not on file  . Highest education level: 12th grade  Occupational History  . Occupation: Retired  Tobacco Use  . Smoking status: Never Smoker  . Smokeless tobacco: Never Used  Vaping Use  . Vaping Use: Never used  Substance and Sexual Activity  . Alcohol use: No    Alcohol/week: 0.0 standard drinks  . Drug use: No  . Sexual activity: Not on file  Other Topics Concern  . Not on file  Social History Narrative  . Not on file   Social Determinants of Health   Financial Resource Strain: Not on file  Food Insecurity: Not on file  Transportation Needs: Not on file  Physical Activity: Not on file  Stress: Not on file  Social Connections: Not on file  Intimate Partner Violence: Not on file     BP (!) 90/56   Pulse 77   Ht 5\' 1"  (1.549 m)   Wt 94 lb (42.6 kg) Comment: Stated weight by family member  BMI 17.76 kg/m   Physical Exam:  Well appearing NAD HEENT: Unremarkable Neck:  No JVD, no thyromegally Lymphatics:  No adenopathy Back:  No CVA  tenderness Lungs:  Clear HEART:  Regular rate rhythm, no murmurs, no rubs, no clicks Abd:  soft, positive bowel sounds, no organomegally, no rebound, no guarding Ext:  2 plus pulses, no edema, no cyanosis, no clubbing Skin:  No rashes no nodules Neuro:  CN II through XII intact, motor grossly intact   DEVICE  Normal device function.  See PaceArt for details.   Assess/Plan: 1. Atrial fib - her VR is well controlled. She is not a candidate for systemic anti-coagulation due to bleeding and anemia. 2. AS - she is s/p TAVR and her exam is unremarkable 3. Sinus node dysfunction - she is asymptomatic, s/p PPM insertion.   Crystal Overlie Dayron Odland,MD

## 2020-10-25 NOTE — Patient Instructions (Signed)
Medication Instructions:  Your physician recommends that you continue on your current medications as directed. Please refer to the Current Medication list given to you today.  *If you need a refill on your cardiac medications before your next appointment, please call your pharmacy*   Lab Work: NONE   If you have labs (blood work) drawn today and your tests are completely normal, you will receive your results only by: . MyChart Message (if you have MyChart) OR . A paper copy in the mail If you have any lab test that is abnormal or we need to change your treatment, we will call you to review the results.   Testing/Procedures: NONE    Follow-Up: At [facility], you and your health needs are our priority.  As part of our continuing mission to provide you with exceptional heart care, we have created designated Provider Care Teams.  These Care Teams include your primary Cardiologist (physician) and Advanced Practice Providers (APPs -  Physician Assistants and Nurse Practitioners) who all work together to provide you with the care you need, when you need it.  We recommend signing up for the patient portal called "MyChart".  Sign up information is provided on this After Visit Summary.  MyChart is used to connect with patients for Virtual Visits (Telemedicine).  Patients are able to view lab/test results, encounter notes, upcoming appointments, etc.  Non-urgent messages can be sent to your provider as well.   To learn more about what you can do with MyChart, go to [email or web address].    Your next appointment:   1 year(s)  The format for your next appointment:   In Person  Provider:   Gregg Taylor, MD   Other Instructions Thank you for choosing [facility]!    

## 2020-11-08 DIAGNOSIS — M79642 Pain in left hand: Secondary | ICD-10-CM | POA: Diagnosis not present

## 2020-11-08 DIAGNOSIS — M25532 Pain in left wrist: Secondary | ICD-10-CM | POA: Diagnosis not present

## 2020-11-08 DIAGNOSIS — M19031 Primary osteoarthritis, right wrist: Secondary | ICD-10-CM | POA: Diagnosis not present

## 2020-11-08 DIAGNOSIS — M19042 Primary osteoarthritis, left hand: Secondary | ICD-10-CM | POA: Diagnosis not present

## 2020-11-08 DIAGNOSIS — R2232 Localized swelling, mass and lump, left upper limb: Secondary | ICD-10-CM | POA: Diagnosis not present

## 2020-11-09 ENCOUNTER — Other Ambulatory Visit: Payer: Self-pay

## 2020-11-09 ENCOUNTER — Inpatient Hospital Stay (HOSPITAL_COMMUNITY): Payer: Medicare Other | Attending: Hematology

## 2020-11-09 DIAGNOSIS — R1900 Intra-abdominal and pelvic swelling, mass and lump, unspecified site: Secondary | ICD-10-CM | POA: Diagnosis not present

## 2020-11-09 DIAGNOSIS — Z79899 Other long term (current) drug therapy: Secondary | ICD-10-CM | POA: Insufficient documentation

## 2020-11-09 DIAGNOSIS — D509 Iron deficiency anemia, unspecified: Secondary | ICD-10-CM | POA: Insufficient documentation

## 2020-11-09 DIAGNOSIS — N189 Chronic kidney disease, unspecified: Secondary | ICD-10-CM | POA: Diagnosis not present

## 2020-11-09 DIAGNOSIS — E538 Deficiency of other specified B group vitamins: Secondary | ICD-10-CM | POA: Diagnosis not present

## 2020-11-09 DIAGNOSIS — D500 Iron deficiency anemia secondary to blood loss (chronic): Secondary | ICD-10-CM

## 2020-11-09 DIAGNOSIS — K6389 Other specified diseases of intestine: Secondary | ICD-10-CM

## 2020-11-09 DIAGNOSIS — R970 Elevated carcinoembryonic antigen [CEA]: Secondary | ICD-10-CM | POA: Insufficient documentation

## 2020-11-09 LAB — CBC WITH DIFFERENTIAL/PLATELET
Abs Immature Granulocytes: 0.01 10*3/uL (ref 0.00–0.07)
Basophils Absolute: 0.1 10*3/uL (ref 0.0–0.1)
Basophils Relative: 2 %
Eosinophils Absolute: 0.1 10*3/uL (ref 0.0–0.5)
Eosinophils Relative: 2 %
HCT: 29.4 % — ABNORMAL LOW (ref 36.0–46.0)
Hemoglobin: 8.7 g/dL — ABNORMAL LOW (ref 12.0–15.0)
Immature Granulocytes: 0 %
Lymphocytes Relative: 18 %
Lymphs Abs: 0.8 10*3/uL (ref 0.7–4.0)
MCH: 28.8 pg (ref 26.0–34.0)
MCHC: 29.6 g/dL — ABNORMAL LOW (ref 30.0–36.0)
MCV: 97.4 fL (ref 80.0–100.0)
Monocytes Absolute: 0.4 10*3/uL (ref 0.1–1.0)
Monocytes Relative: 10 %
Neutro Abs: 3 10*3/uL (ref 1.7–7.7)
Neutrophils Relative %: 68 %
Platelets: 180 10*3/uL (ref 150–400)
RBC: 3.02 MIL/uL — ABNORMAL LOW (ref 3.87–5.11)
RDW: 15.6 % — ABNORMAL HIGH (ref 11.5–15.5)
WBC: 4.4 10*3/uL (ref 4.0–10.5)
nRBC: 0 % (ref 0.0–0.2)

## 2020-11-09 LAB — COMPREHENSIVE METABOLIC PANEL
ALT: 19 U/L (ref 0–44)
AST: 31 U/L (ref 15–41)
Albumin: 2.2 g/dL — ABNORMAL LOW (ref 3.5–5.0)
Alkaline Phosphatase: 136 U/L — ABNORMAL HIGH (ref 38–126)
Anion gap: 7 (ref 5–15)
BUN: 24 mg/dL — ABNORMAL HIGH (ref 8–23)
CO2: 25 mmol/L (ref 22–32)
Calcium: 7.8 mg/dL — ABNORMAL LOW (ref 8.9–10.3)
Chloride: 102 mmol/L (ref 98–111)
Creatinine, Ser: 1.16 mg/dL — ABNORMAL HIGH (ref 0.44–1.00)
GFR, Estimated: 45 mL/min — ABNORMAL LOW (ref >60)
Glucose, Bld: 99 mg/dL (ref 70–99)
Potassium: 4.1 mmol/L (ref 3.5–5.1)
Sodium: 134 mmol/L — ABNORMAL LOW (ref 135–145)
Total Bilirubin: 0.5 mg/dL (ref 0.3–1.2)
Total Protein: 5.6 g/dL — ABNORMAL LOW (ref 6.5–8.1)

## 2020-11-09 LAB — IRON AND TIBC
Iron: 30 ug/dL (ref 28–170)
Saturation Ratios: 16 % (ref 10.4–31.8)
TIBC: 187 ug/dL — ABNORMAL LOW (ref 250–450)
UIBC: 157 ug/dL

## 2020-11-09 LAB — FERRITIN: Ferritin: 120 ng/mL (ref 11–307)

## 2020-11-10 ENCOUNTER — Inpatient Hospital Stay (HOSPITAL_BASED_OUTPATIENT_CLINIC_OR_DEPARTMENT_OTHER): Payer: Medicare Other | Admitting: Hematology

## 2020-11-10 DIAGNOSIS — R1900 Intra-abdominal and pelvic swelling, mass and lump, unspecified site: Secondary | ICD-10-CM

## 2020-11-10 DIAGNOSIS — D500 Iron deficiency anemia secondary to blood loss (chronic): Secondary | ICD-10-CM | POA: Diagnosis not present

## 2020-11-10 LAB — CEA: CEA: 8.1 ng/mL — ABNORMAL HIGH (ref 0.0–4.7)

## 2020-11-10 NOTE — Progress Notes (Signed)
Virtual Visit via Telephone Note  I connected with Ahriana Gunkel Ebeling on 11/10/20 at  4:15 PM EDT by telephone and verified that I am speaking with the correct person using two identifiers.  Location: Patient: At [facility] Provider: In the office   I discussed the limitations, risks, security and privacy concerns of performing an evaluation and management service by telephone and the availability of in person appointments. I also discussed with the patient that there may be a patient responsible charge related to this service. The patient expressed understanding and agreed to proceed.   History of Present Illness: She is seen in our clinic for anemia secondary to severe iron deficiency from bleeding and CKD.  She has also presumed ascending colon cancer and has rectal bleeding from it.  She has dementia and is currently a resident at [facility].   Observations/Objective: I have talked to her daughter Quita Skye on the phone at [phone].  She talked to her today and patient reportedly had a very good day.  She was very talkative today.  She reportedly eats very good breakfast but does not eat much at lunch or dinner.  She is continuing to have occasional bleeding per rectum.  Assessment and Plan:  1.  Normocytic anemia: - This is from combination of GI blood loss, iron deficiency, CKD. - Last Feraheme on 09/23/2020 on 09/30/2020. - She is continuing to have bleeding per rectum on and off. - Reviewed labs from 11/09/2020.  Hemoglobin dropped to 8.7 from 9.8.  Ferritin has improved to 120 from 79. - We again discussed whether to treat her anemia aggressively with parenteral iron therapy.  Daughter wishes her mother to be comfortable and not symptomatic from severe anemia as she becomes very nontalkative if she is feeling weak. - We will proceed with Feraheme x2.  We will repeat labs and regroup in 2 months.  2.  Presumed ascending colon cancer: - CTAP on 08/17/2020 with 6 cm ascending colon mass  with no adenopathy and no liver metastasis. - Based on her advanced age, dementia, we have decided not to do anything aggressive measures like colonoscopy or surgery. - We have reviewed CEA level from 11/09/2020 increased at 8.1.  Alk phos was also elevated at 136. - We are not doing any scans to monitor this.   Follow Up Instructions: Phone visit in 2 months with labs.   I discussed the assessment and treatment plan with the patient. The patient was provided an opportunity to ask questions and all were answered. The patient agreed with the plan and demonstrated an understanding of the instructions.   The patient was advised to call back or seek an in-person evaluation if the symptoms worsen or if the condition fails to improve as anticipated.  I provided 21 minutes of non-face-to-face time during this encounter.   Derek Jack, MD

## 2020-11-11 ENCOUNTER — Encounter: Payer: Self-pay | Admitting: Adult Health

## 2020-11-11 ENCOUNTER — Non-Acute Institutional Stay (SKILLED_NURSING_FACILITY): Payer: Medicare Other | Admitting: Adult Health

## 2020-11-11 DIAGNOSIS — F0390 Unspecified dementia without behavioral disturbance: Secondary | ICD-10-CM

## 2020-11-11 DIAGNOSIS — R443 Hallucinations, unspecified: Secondary | ICD-10-CM

## 2020-11-11 DIAGNOSIS — F0392 Unspecified dementia, unspecified severity, with psychotic disturbance: Secondary | ICD-10-CM

## 2020-11-11 DIAGNOSIS — G309 Alzheimer's disease, unspecified: Secondary | ICD-10-CM | POA: Diagnosis not present

## 2020-11-11 DIAGNOSIS — F0280 Dementia in other diseases classified elsewhere without behavioral disturbance: Secondary | ICD-10-CM

## 2020-11-11 NOTE — Progress Notes (Signed)
Location:  [place] Room Number: 142-D Place of Service:  SNF (31)   CODE STATUS: DNR  Allergies  Allergen Reactions  . Hctz [Hydrochlorothiazide] Other (See Comments)    Pt was ill and this affected her kidneys   . Aspirin Other (See Comments)    Cardiologist said the patient is to not take this  . Codeine Other (See Comments)    Made the patient feel ill, has not had any problems since 1977    Chief Complaint  Patient presents with  . Acute Visit    Acute for hallucinations.    HPI:  She does have a history of dementia. She is presently taking buspar 5 mg twice daily. Family reports that she is having hallucinations about seeing bugs on the ceiling. This does make her nervous but is not terrifying. There are no reports of changes in appetite; no changes in urine no indications of pain; there are no reports of fevers present.   Past Medical History:  Diagnosis Date  . AKI (acute kidney injury) ([place])   . Anemia   . Aortic stenosis    Status post TAVR 2017  . Arthritis   . Asthma   . Atrial fibrillation ([place])   . AVM (arteriovenous malformation) of small bowel, acquired   . CKD (chronic kidney disease) stage 3, GFR 30-59 ml/min (HCC)   . Colon cancer ([place])   . Dementia ([place])   . Essential hypertension   . GERD (gastroesophageal reflux disease)   . GI bleed   . Glaucoma   . Hearing loss   . History of pneumonia   . Pacemaker    [employer]  . Recurrent UTI   . Stroke ([place])   . SVT (supraventricular tachycardia) (HCC)    S/P ablation of AVNRT in 2003    Past Surgical History:  Procedure Laterality Date  . APPENDECTOMY    . BIOPSY  04/07/2018   Procedure: BIOPSY;  Surgeon: Jerene Bears, MD;  Location: [facility] ENDOSCOPY;  Service: Gastroenterology;;  . BIOPSY  02/19/2020   Procedure: BIOPSY;  Surgeon: Harvel Quale, MD;  [facility] ENDO SU[facility];  Service: Gastroenterology;;  gastric   . BLADDER SURGERY    . CARDIAC  CATHETERIZATION N/A 09/26/2015   Procedure: Right/Left Heart Cath and Coronary Angiography;  Surgeon: Burnell Blanks, MD;  Location: [place] CV LAB;  Service: Cardiovascular;  Laterality: N/A;  . CARDIAC SURGERY    . CARDIOVERSION N/A 03/02/2016   Procedure: CARDIOVERSION;  Surgeon: Thayer Headings, MD;  Location: [facility] ENDOSCOPY;  Service: Cardiovascular;  Laterality: N/A;  . ENTEROSCOPY  02/19/2020   Procedure: ENTEROSCOPY;  Surgeon: Harvel Quale, MD;  Location: [facility]O [facility];  Service: Gastroenterology;;  . EP IMPLANTABLE DEVICE N/A 10/26/2015   Procedure: Pacemaker Implant;  Surgeon: Will Meredith Leeds, MD;  Location: [place] CV LAB;  Service: Cardiovascular;  Laterality: N/A;  . ESOPHAGOGASTRODUODENOSCOPY (EGD) WITH PROPOFOL N/A 04/07/2018   Procedure: ESOPHAGOGASTRODUODENOSCOPY (EGD) WITH PROPOFOL;  Surgeon: Jerene Bears, MD;  Location: [facility] ENDOSCOPY;  Service: Gastroenterology;  Laterality: N/A;  . ESOPHAGOGASTRODUODENOSCOPY (EGD) WITH PROPOFOL N/A 10/12/2019   Procedure: ESOPHAGOGASTRODUODENOSCOPY (EGD) WITH PROPOFOL;  Surgeon: Danie Binder, MD;  [facility];  Service: Endoscopy;  Laterality: N/A;  . EYE SURGERY Bilateral    cataract surgery  . FLEXIBLE SIGMOIDOSCOPY  02/19/2020   Procedure: FLEXIBLE SIGMOIDOSCOPY;  Surgeon: Montez Morita, Quillian Quince, MD;  [facility];  Service: Gastroenterology;;  . HIP  FRACTURE SURGERY Left 02/2020  . HOT HEMOSTASIS N/A 04/07/2018   Procedure: HOT HEMOSTASIS (ARGON PLASMA COAGULATION/BICAP);  Surgeon: Jerene Bears, MD;  Location: [facility] ENDOSCOPY;  Service: Gastroenterology;  Laterality: N/A;  . HOT HEMOSTASIS  10/12/2019   Procedure: HOT HEMOSTASIS (ARGON PLASMA COAGULATION/BICAP);  Surgeon: Danie Binder, MD;  Location[facility];  Service: Endoscopy;;  . INTRAMEDULLARY (IM) NAIL INTERTROCHANTERIC Left 03/10/2020   Procedure: OPEN TREATMENT INTERNAL FIXATION LEFT HIP (WITH GAMMA NAIL);  Surgeon:  Carole Civil, MD;  Location: [facility] ORS;  Service: Orthopedics;  Laterality: Left;  . NASAL SINUS SURGERY    . PACEMAKER INSERTION    . TEE WITHOUT CARDIOVERSION N/A 10/25/2015   Procedure: TRANSESOPHAGEAL ECHOCARDIOGRAM (TEE);  Surgeon: Burnell Blanks, MD;  Location: [place];  Service: Open Heart Surgery;  Laterality: N/A;  . TRANSCATHETER AORTIC VALVE REPLACEMENT, TRANSFEMORAL Right 10/25/2015   Procedure: TRANSCATHETER AORTIC VALVE REPLACEMENT, TRANSFEMORAL;  Surgeon: Burnell Blanks, MD;  Location: [place];  Service: Open Heart Surgery;  Laterality: Right;    Social History   Socioeconomic History  . Marital status: Widowed    Spouse name: Not on file  . Number of children: 3  . Years of education: Not on file  . Highest education level: 12th grade  Occupational History  . Occupation: Retired  Tobacco Use  . Smoking status: Never Smoker  . Smokeless tobacco: Never Used  Vaping Use  . Vaping Use: Never used  Substance and Sexual Activity  . Alcohol use: No    Alcohol/week: 0.0 standard drinks  . Drug use: No  . Sexual activity: Not on file  Other Topics Concern  . Not on file  Social History Narrative  . Not on file   Social Determinants of Health   Financial Resource Strain: Not on file  Food Insecurity: Not on file  Transportation Needs: Not on file  Physical Activity: Not on file  Stress: Not on file  Social Connections: Not on file  Intimate Partner Violence: Not on file   Family History  Problem Relation Age of Onset  . Hypertension Mother   . Pneumonia Father   . Stomach cancer Brother   . Stroke Brother   . AAA (abdominal aortic aneurysm) Brother   . Cervical cancer Daughter   . Heart attack Neg Hx   . Colon cancer Neg Hx   . Esophageal cancer Neg Hx       VITAL SIGNS BP 127/61   Pulse 74   Temp (!) 97.4 F (36.3 C)   Resp (!) 22   Ht 5' 1"  (1.549 m)   Wt 97 lb 6.4 oz (44.2 kg)   SpO2 93%   BMI 18.40 kg/m   Outpatient  Encounter Medications as of 11/11/2020  Medication Sig  . acetaminophen (TYLENOL) 325 MG tablet Take 2 tablets (650 mg total) by mouth every 4 (four) hours as needed for mild pain (or temp > 37.5 C (99.5 F)).  [name] albuterol (PROAIR HFA) 108 (90 Base) MCG/ACT inhaler Inhale 2 puffs into the lungs every 4 (four) hours as needed for wheezing or shortness of breath.  Roseanne Kaufman [place]-Castor Oil OINT Apply 1 application topically daily. Apply to sacrum and bilateral buttocks every shift  . Biotin 10 MG CAPS Take 10 mg by mouth daily.   . Budeson-Glycopyrrol-Formoterol (BREZTRI AEROSPHERE) 160-9-4.8 MCG/ACT AERO Inhale 2 Inhalers into the lungs in the morning and at bedtime.  . busPIRone (BUSPAR) 5 MG tablet Take 5 mg by  mouth 2 (two) times daily.  . Calcium Carb-Cholecalciferol (CALTRATE 600+D3) 600-800 MG-UNIT TABS Take 1 tablet by mouth daily.  [name] docusate sodium (COLACE) 100 MG capsule Take 1 capsule (100 mg total) by mouth 2 (two) times daily.  . famotidine (PEPCID) 20 MG tablet Take 1 tablet (20 mg total) by mouth at bedtime.  [name] Fe Bisgly-Fe Polysac-Vit C (NIFEREX-150 PO) Take by mouth. Take 150 mg iron- 60 mg-1 mg; oral,Once A Day; 09:00 AM  . feeding supplement (ENSURE ENLIVE / ENSURE PLUS) LIQD Take 237 mLs by mouth 2 (two) times daily between meals.  . folic acid (FOLVITE) 1 MG tablet Take 1 tablet (1 mg total) by mouth daily.  . furosemide (LASIX) 40 MG tablet Take 1 tablet (40 mg total) by mouth daily.  [name] levalbuterol (XOPENEX) 1.25 MG/3ML nebulizer solution Take 1.25 mg by nebulization every 4 (four) hours as needed for wheezing. Dx J45.909  . levothyroxine (SYNTHROID) 25 MCG tablet Take 25 mcg by mouth daily before breakfast.  . loratadine (CLARITIN) 10 MG tablet Take 10 mg by mouth daily.  . memantine (NAMENDA) 10 MG tablet Take 1 tablet (10 mg total) by mouth 2 (two) times daily.  . metoprolol tartrate (LOPRESSOR) 12.5 mg TABS tablet Take 12.5 mg by mouth 2 (two) times daily.  . NON FORMULARY  Regular diet  . omeprazole (PRILOSEC) 40 MG capsule Take 40 mg by mouth in the morning and at bedtime.  . potassium chloride (KLOR-CON) 10 MEQ tablet Take 2 tablets (20 mEq total) by mouth daily.  . [DISCONTINUED] carbamide peroxide (DEBROX) 6.5 % OTIC solution 5 drops 2 (two) times daily. (Patient not taking: Reported on 11/11/2020)  . [DISCONTINUED] pantoprazole (PROTONIX) 40 MG tablet Take 40 mg by mouth 2 (two) times daily. (Patient not taking: Reported on 11/11/2020)   No facility-administered encounter medications on file as of 11/11/2020.     SIGNIFICANT DIAGNOSTIC EXAMS  PREVIOUS   08-13-20: ct head:  No acute abnormality. Atrophy and chronic microvascular ischemic disease.  08-13-20: ct angio of head and neck:  1. 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 2. 26 mL ischemic penumbra in the left MCA territory without evidence of a core infarct. 3. Mild-to-moderate cervical carotid atherosclerosis without significant stenosis. 4. Aortic Atherosclerosis   08-14-20: carotid doppler: Color duplex indicates minimal heterogeneous and calcified plaque, with no hemodynamically significant stenosis by duplex criteria in the extracranial cerebrovascular circulation.  08-17-20: ct of chest; abdomen and pelvis Chest Impression: 1. Scattered peribronchial thickening in LEFT and RIGHT lung suggest mild pulmonary inflammation or infection. 2. Focus of consolidation with pleural fluid at the RIGHT lung base differential include pneumonia or round atelectasis.   Abdomen / Pelvis Impression: 1. Circumferential submucosal thickening over 6 cm segment of the ascending colon. 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 Recommend endoscopy for further evaluation. 2. No evidence of metastatic adenopathy other than a small retrocrural lymph node which is favored unrelated 3. No liver metastasis. 4. Multiple diverticula bladder.  NO NEW EXAMS.   LABS REVIEWED PREVIOUS   08-13-20; wbc 7.5;  hgb 9.4; hct 31.8; mcv 95.8 plt 270; glucose 122; bun 24; creat 1.28; k+ 3.9; na++ 135; ca 8.7 liver normal albumin 2.9 08-14-20; hgb a1c 4.7; chol 134; ldl 66; trig 96; hdl 49 08-17-20: wbc 7.7; hgb 9.4; hct 30.4; mcv 94.4 plt 242; glucose 111; bun 17; creat 1.16; k+ 3.7; na++ 136; ca 8.9 liver normal albumin 2.7  09-05-20: wbc 8.0; hgb 9.8; hct 31.5; mcv 94.6 plt 245;  glucose 123; bun 22; creat 1.55; k+ 4.1; na++ 131; ca 8.5 GFR 21 liver normal albumin 2.9; iron 26; ferritin 222; vit B 12: >7500 09-17-20: tsh 1.637   TODAY  11-09-20: wbc 4.4; hgb 8.7; hct 29.4 mcv 97.4 plt 180; glucose 99; bun 24; creat 1.16; k+ 4.1; na++ 134; ca 7.8 GFR 45; alk phos 136; albumin 2.2; CEA 8.1; iron 30; tibc 187; ferritin 120     Review of Systems  Unable to perform ROS: Dementia (unable to participate )    Physical Exam Constitutional:      General: She is not in acute distress.    Appearance: She is well-developed. She is not diaphoretic.  Neck:     Thyroid: No thyromegaly.  Cardiovascular:     Rate and Rhythm: Normal rate and regular rhythm.     Heart sounds: Normal heart sounds.     Comments: Pace maker  Pulmonary:     Effort: Pulmonary effort is normal. No respiratory distress.     Breath sounds: Normal breath sounds.  Abdominal:     General: Bowel sounds are normal. There is no distension.     Palpations: Abdomen is soft.     Tenderness: There is no abdominal tenderness.  Musculoskeletal:        General: Normal range of motion.     Cervical back: Neck supple.     Right lower leg: No edema.     Left lower leg: No edema.  Lymphadenopathy:     Cervical: No cervical adenopathy.  Skin:    General: Skin is warm and dry.  Neurological:     Mental Status: She is alert. Mental status is at baseline.  Psychiatric:        Mood and Affect: Mood normal.        ASSESSMENT/ PLAN:   TODAY  1. Alzheimer's disease without behavioral disturbance unspecified age onset:  2. Hallucinations due to  late onset dementia:   Will increase buspar to 5 mg three times daily   Ok Edwards NP [facility] Adult [facility]  Contact [phone] Monday through Friday 8am- 5pm  After hours call [phone]

## 2020-11-15 DIAGNOSIS — F0390 Unspecified dementia without behavioral disturbance: Secondary | ICD-10-CM | POA: Insufficient documentation

## 2020-11-15 DIAGNOSIS — F0392 Unspecified dementia, unspecified severity, with psychotic disturbance: Secondary | ICD-10-CM | POA: Insufficient documentation

## 2020-11-16 ENCOUNTER — Telehealth (HOSPITAL_COMMUNITY): Payer: Medicare Other | Admitting: Physician Assistant

## 2020-11-16 ENCOUNTER — Inpatient Hospital Stay (HOSPITAL_COMMUNITY): Payer: Medicare Other

## 2020-11-16 VITALS — BP 109/49 | HR 110 | Temp 96.8°F | Resp 18

## 2020-11-16 DIAGNOSIS — R970 Elevated carcinoembryonic antigen [CEA]: Secondary | ICD-10-CM | POA: Diagnosis not present

## 2020-11-16 DIAGNOSIS — Z79899 Other long term (current) drug therapy: Secondary | ICD-10-CM | POA: Diagnosis not present

## 2020-11-16 DIAGNOSIS — N189 Chronic kidney disease, unspecified: Secondary | ICD-10-CM | POA: Diagnosis not present

## 2020-11-16 DIAGNOSIS — D500 Iron deficiency anemia secondary to blood loss (chronic): Secondary | ICD-10-CM

## 2020-11-16 DIAGNOSIS — D509 Iron deficiency anemia, unspecified: Secondary | ICD-10-CM | POA: Diagnosis not present

## 2020-11-16 DIAGNOSIS — R1900 Intra-abdominal and pelvic swelling, mass and lump, unspecified site: Secondary | ICD-10-CM | POA: Diagnosis not present

## 2020-11-16 DIAGNOSIS — E538 Deficiency of other specified B group vitamins: Secondary | ICD-10-CM | POA: Diagnosis not present

## 2020-11-16 MED ORDER — LORATADINE 10 MG PO TABS
10.0000 mg | ORAL_TABLET | Freq: Once | ORAL | Status: AC
  Administered 2020-11-16: 10 mg via ORAL
  Filled 2020-11-16: qty 1

## 2020-11-16 MED ORDER — ACETAMINOPHEN 325 MG PO TABS
650.0000 mg | ORAL_TABLET | Freq: Once | ORAL | Status: AC
  Administered 2020-11-16: 650 mg via ORAL
  Filled 2020-11-16: qty 2

## 2020-11-16 MED ORDER — FAMOTIDINE 20 MG PO TABS
20.0000 mg | ORAL_TABLET | Freq: Once | ORAL | Status: AC
  Administered 2020-11-16: 20 mg via ORAL
  Filled 2020-11-16: qty 1

## 2020-11-16 MED ORDER — CYANOCOBALAMIN 1000 MCG/ML IJ SOLN
1000.0000 ug | Freq: Once | INTRAMUSCULAR | Status: AC
  Administered 2020-11-16: 1000 ug via INTRAMUSCULAR
  Filled 2020-11-16: qty 1

## 2020-11-16 MED ORDER — SODIUM CHLORIDE 0.9 % IV SOLN
510.0000 mg | Freq: Once | INTRAVENOUS | Status: AC
  Administered 2020-11-16: 510 mg via INTRAVENOUS
  Filled 2020-11-16: qty 510

## 2020-11-16 MED ORDER — SODIUM CHLORIDE 0.9 % IV SOLN: Freq: Once | INTRAVENOUS | Status: AC

## 2020-11-16 NOTE — Patient Instructions (Signed)
[facility]  Discharge Instructions: Thank you for choosing [facility] to provide your oncology and hematology care.  If you have a lab appointment with the Cancer Center, please come in thru the Main Entrance and check in at the main information desk.  Wear comfortable clothing and clothing appropriate for easy access to any Portacath or PICC line.   We strive to give you quality time with your provider. You may need to reschedule your appointment if you arrive late (15 or more minutes).  Arriving late affects you and other patients whose appointments are after yours.  Also, if you miss three or more appointments without notifying the office, you may be dismissed from the clinic at the provider's discretion.      For prescription refill requests, have your pharmacy contact our office and allow 72 hours for refills to be completed.    Today you received Feraheme.   To help prevent nausea and vomiting after your treatment, we encourage you to take your nausea medication as directed.  BELOW ARE SYMPTOMS THAT SHOULD BE REPORTED IMMEDIATELY: . *FEVER GREATER THAN 100.4 F (38 C) OR HIGHER . *CHILLS OR SWEATING . *NAUSEA AND VOMITING THAT IS NOT CONTROLLED WITH YOUR NAUSEA MEDICATION . *UNUSUAL SHORTNESS OF BREATH . *UNUSUAL BRUISING OR BLEEDING . *URINARY PROBLEMS (pain or burning when urinating, or frequent urination) . *BOWEL PROBLEMS (unusual diarrhea, constipation, pain near the anus) . TENDERNESS IN MOUTH AND THROAT WITH OR WITHOUT PRESENCE OF ULCERS (sore throat, sores in mouth, or a toothache) . UNUSUAL RASH, SWELLING OR PAIN  . UNUSUAL VAGINAL DISCHARGE OR ITCHING   Items with * indicate a potential emergency and should be followed up as soon as possible or go to the Emergency Department if any problems should occur.  Please show the CHEMOTHERAPY ALERT CARD or IMMUNOTHERAPY ALERT CARD at check-in to the Emergency Department and triage nurse.  Should you  have questions after your visit or need to cancel or reschedule your appointment, please contact [facility] [phone]  and follow the prompts.  Office hours are 8:00 a.m. to 4:30 p.m. Monday - Friday. Please note that voicemails left after 4:00 p.m. may not be returned until the following business day.  We are closed weekends and major holidays. You have access to a nurse at all times for urgent questions. Please call the main number to the clinic [phone] and follow the prompts.  For any non-urgent questions, you may also contact your provider using MyChart. We now offer e-Visits for anyone 18 and older to request care online for non-urgent symptoms. For details visit [email or web address].   Also download the MyChart app! Go to the app store, search "MyChart", open the app, select [place], and log in with your MyChart username and password.  Due to Covid, a mask is required upon entering the hospital/clinic. If you do not have a mask, one will be given to you upon arrival. For doctor visits, patients may have 1 support person aged 18 or older with them. For treatment visits, patients cannot have anyone with them due to current Covid guidelines and our immunocompromised population.  

## 2020-11-16 NOTE — Progress Notes (Signed)
Patient presents today for Feraheme infusion.  Vital signs WNL.  Patient has no new complaints since last visit.    Peripheral IV started and blood noted pre and post infusion.    Feraheme infusion given today per MD orders.  Stable during infusion without adverse affects.  Vital signs stable.  No complaints at this time.  Discharge from clinic via wheelchair in stable condition.  Alert and oriented X 3.  Follow up with [facility] as scheduled.

## 2020-11-22 ENCOUNTER — Non-Acute Institutional Stay (SKILLED_NURSING_FACILITY): Payer: Medicare Other | Admitting: Adult Health

## 2020-11-22 ENCOUNTER — Encounter: Payer: Self-pay | Admitting: Adult Health

## 2020-11-22 DIAGNOSIS — I5032 Chronic diastolic (congestive) heart failure: Secondary | ICD-10-CM | POA: Diagnosis not present

## 2020-11-22 DIAGNOSIS — K219 Gastro-esophageal reflux disease without esophagitis: Secondary | ICD-10-CM

## 2020-11-22 DIAGNOSIS — C189 Malignant neoplasm of colon, unspecified: Secondary | ICD-10-CM

## 2020-11-22 DIAGNOSIS — J439 Emphysema, unspecified: Secondary | ICD-10-CM

## 2020-11-22 DIAGNOSIS — N1832 Hypertensive heart and chronic kidney disease with heart failure and stage 1 through stage 4 chronic kidney disease, or unspecified chronic kidney disease: Secondary | ICD-10-CM

## 2020-11-22 DIAGNOSIS — I130 Hypertensive heart and chronic kidney disease with heart failure and stage 1 through stage 4 chronic kidney disease, or unspecified chronic kidney disease: Secondary | ICD-10-CM | POA: Diagnosis not present

## 2020-11-22 NOTE — Progress Notes (Signed)
Location:  [facility] Room Number: 142-D Place of Service:  SNF (31)   CODE STATUS: DNR  Allergies  Allergen Reactions  . Hctz [Hydrochlorothiazide] Other (See Comments)    Pt was ill and this affected her kidneys   . Aspirin Other (See Comments)    Cardiologist said the patient is to not take this  . Codeine Other (See Comments)    Made the patient feel ill, has not had any problems since 1977    Chief Complaint  Patient presents with  . 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:     Pulmonary emphysema unspecified emphysema type:   Colon cancer of colon unspecified part of colon:   Gastroesophageal reflux disease without esophagitis    HPI:  She is a 87 year old long term resident of this facility being seen for the management of her chronic illnesses: 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:     Pulmonary emphysema unspecified emphysema type:   Colon cancer of colon unspecified part of colon:   Gastroesophageal reflux disease without esophagitis :   Colon cancer of colon unspecified part of colon.  There are no reports of anxiety; no reports of uncontrolled pain; no reports of insomnia.   Past Medical History:  Diagnosis Date  . AKI (acute kidney injury) ([place])   . Anemia   . Aortic stenosis    Status post TAVR 2017  . Arthritis   . Asthma   . Atrial fibrillation ([place])   . AVM (arteriovenous malformation) of small bowel, acquired   . CKD (chronic kidney disease) stage 3, GFR 30-59 ml/min (HCC)   . Colon cancer ([place])   . Dementia ([place])   . Essential hypertension   . GERD (gastroesophageal reflux disease)   . GI bleed   . Glaucoma   . Hearing loss   . History of pneumonia   . Pacemaker    [employer]  . Recurrent UTI   . Stroke ([place])   .  SVT (supraventricular tachycardia) (HCC)    S/P ablation of AVNRT in 2003    Past Surgical History:  Procedure Laterality Date  . APPENDECTOMY    . BIOPSY  04/07/2018   Procedure: BIOPSY;  Surgeon: Jerene Bears, MD;  Location: [facility] ENDOSCOPY;  Service: Gastroenterology;;  . BIOPSY  02/19/2020   Procedure: BIOPSY;  Surgeon: Harvel Quale, MD;  [facility] END[facility] SU[facility];  Service: Gastroenterology;;  gastric   . BLADDER SURGERY    . CARDIAC CATHETERIZATION N/A 09/26/2015   Procedure: Right/Left Heart Cath and Coronary Angiography;  Surgeon: Burnell Blanks, MD;  Location: [place] CV LAB;  Service: Cardiovascular;  Laterality: N/A;  . CARDIAC SURGERY    . CARDIOVERSION N/A 03/02/2016   Procedure: CARDIOVERSION;  Surgeon: Thayer Headings, MD;  Location: [facility] ENDOSCOPY;  Service: Cardiovascular;  Laterality: N/A;  . ENTEROSCOPY  02/19/2020   Procedure: ENTEROSCOPY;  Surgeon: Harvel Quale, MD;  [facility] ENDO SU[facility];  Service: Gastroenterology;;  . EP IMPLANTABLE DEVICE N/A 10/26/2015   Procedure: Pacemaker Implant;  Surgeon: Will Meredith Leeds, MD;  Location: [place] CV LAB;  Service: Cardiovascular;  Laterality: N/A;  . ESOPHAGOGASTRODUODENOSCOPY (EGD) WITH PROPOFOL N/A 04/07/2018   Procedure: ESOPHAGOGASTRODUODENOSCOPY (EGD) WITH PROPOFOL;  Surgeon: Jerene Bears, MD;  Location: [facility] ENDOSCOPY;  Service: Gastroenterology;  Laterality: N/A;  . ESOPHAGOGASTRODUODENOSCOPY (EGD) WITH PROPOFOL N/A 10/12/2019   Procedure: ESOPHAGOGASTRODUODENOSCOPY (EGD) WITH PROPOFOL;  Surgeon: Danie Binder, MD;  [facility] ENDO [facility];  Service: Endoscopy;  Laterality: N/A;  . EYE SURGERY Bilateral    cataract surgery  . FLEXIBLE SIGMOIDOSCOPY  02/19/2020   Procedure: FLEXIBLE SIGMOIDOSCOPY;  Surgeon: Harvel Quale, MD;  [facility];  Service: Gastroenterology;;  . HIP FRACTURE SURGERY Left 02/2020  . HOT HEMOSTASIS N/A 04/07/2018   Procedure: HOT  HEMOSTASIS (ARGON PLASMA COAGULATION/BICAP);  Surgeon: Jerene Bears, MD;  Location[facility] ENDOSCOPY;  Service: Gastroenterology;  Laterality: N/A;  . HOT HEMOSTASIS  10/12/2019   Procedure: HOT HEMOSTASIS (ARGON PLASMA COAGULATION/BICAP);  Surgeon: Danie Binder, MD;  [facility] ENDO [facility];  Service: Endoscopy;;  . INTRAMEDULLARY (IM) NAIL INTERTROCHANTERIC Left 03/10/2020   Procedure: OPEN TREATMENT INTERNAL FIXATION LEFT HIP (WITH GAMMA NAIL);  Surgeon: Carole Civil, MD;  Location: [facility] ORS;  Service: Orthopedics;  Laterality: Left;  . NASAL SINUS SURGERY    . PACEMAKER INSERTION    . TEE WITHOUT CARDIOVERSION N/A 10/25/2015   Procedure: TRANSESOPHAGEAL ECHOCARDIOGRAM (TEE);  Surgeon: Burnell Blanks, MD;  Location: [employer];  Service: Open Heart Surgery;  Laterality: N/A;  . TRANSCATHETER AORTIC VALVE REPLACEMENT, TRANSFEMORAL Right 10/25/2015   Procedure: TRANSCATHETER AORTIC VALVE REPLACEMENT, TRANSFEMORAL;  Surgeon: Burnell Blanks, MD;  Location: [place];  Service: Open Heart Surgery;  Laterality: Right;    Social History   Socioeconomic History  . Marital status: Widowed    Spouse name: Not on file  . Number of children: 3  . Years of education: Not on file  . Highest education level: 12th grade  Occupational History  . Occupation: Retired  Tobacco Use  . Smoking status: Never Smoker  . Smokeless tobacco: Never Used  Vaping Use  . Vaping Use: Never used  Substance and Sexual Activity  . Alcohol use: No    Alcohol/week: 0.0 standard drinks  . Drug use: No  . Sexual activity: Not on file  Other Topics Concern  . Not on file  Social History Narrative  . Not on file   Social Determinants of Health   Financial Resource Strain: Not on file  Food Insecurity: Not on file  Transportation Needs: Not on file  Physical Activity: Not on file  Stress: Not on file  Social Connections: Not on file  Intimate Partner Violence: Not on file   Family History  Problem  Relation Age of Onset  . Hypertension Mother   . Pneumonia Father   . Stomach cancer Brother   . Stroke Brother   . AAA (abdominal aortic aneurysm) Brother   . Cervical cancer Daughter   . Heart attack Neg Hx   . Colon cancer Neg Hx   . Esophageal cancer Neg Hx       VITAL SIGNS BP 122/64   Pulse 76   Temp 97.6 F (36.4 C)   Resp (!) 22   Ht _0  (1.549 m)   Wt 97 lb 6.4 oz (44.2 kg)   SpO2 93%   BMI 18.40 kg/m   Outpatient Encounter Medications as of 11/22/2020  Medication Sig  . acetaminophen (TYLENOL) 325 MG tablet Take 2 tablets (650 mg total) by mouth every 4 (four) hours as needed for mild pain (or temp > 37.5 C (99.5 F)).  [name] albuterol (PROAIR HFA) 108 (90 Base) MCG/ACT inhaler Inhale 2 puffs  into the lungs every 4 (four) hours as needed for wheezing or shortness of breath.  . Amino Acids-Protein Hydrolys (FEEDING SUPPLEMENT, PRO-STAT SUGAR FREE 64,) LIQD Take 30 mLs by mouth 3 (three) times daily with meals. for albumin of 2.2  . Balsam [place]-Castor Oil OINT Apply 1 application topically daily. Apply to sacrum and bilateral buttocks every shift  . Biotin 10 MG CAPS Take 10 mg by mouth daily.   . Budeson-Glycopyrrol-Formoterol (BREZTRI AEROSPHERE) 160-9-4.8 MCG/ACT AERO Inhale 2 Inhalers into the lungs in the morning and at bedtime.  . busPIRone (BUSPAR) 5 MG tablet Take 5 mg by mouth 3 (three) times daily.  . Calcium Carb-Cholecalciferol (CALTRATE 600+D3) 600-800 MG-UNIT TABS Take 1 tablet by mouth daily.  [name] docusate sodium (COLACE) 100 MG capsule Take 1 capsule (100 mg total) by mouth 2 (two) times daily.  . famotidine (PEPCID) 20 MG tablet Take 1 tablet (20 mg total) by mouth at bedtime.  [name] Fe Bisgly-Fe Polysac-Vit C (NIFEREX-150 PO) Take by mouth. Take 150 mg iron- 60 mg-1 mg; oral,Once A Day; 09:00 AM  . feeding supplement (ENSURE ENLIVE / ENSURE PLUS) LIQD Take 237 mLs by mouth 2 (two) times daily between meals.  . folic acid (FOLVITE) 1 MG tablet Take 1 tablet (1  mg total) by mouth daily.  . furosemide (LASIX) 40 MG tablet Take 1 tablet (40 mg total) by mouth daily.  [name] levalbuterol (XOPENEX) 1.25 MG/3ML nebulizer solution Take 1.25 mg by nebulization every 4 (four) hours as needed for wheezing. Dx J45.909  . levothyroxine (SYNTHROID) 25 MCG tablet Take 25 mcg by mouth daily before breakfast.  . loratadine (CLARITIN) 10 MG tablet Take 10 mg by mouth daily.  . memantine (NAMENDA) 10 MG tablet Take 1 tablet (10 mg total) by mouth 2 (two) times daily.  . metoprolol tartrate (LOPRESSOR) 25 MG tablet Take 12.5 mg by mouth 2 (two) times daily. HOLD FOR SYSTOLIC B/P <= 881 [DX: Paroxysmal atrial fibrillation]  . NON FORMULARY Regular diet  . omeprazole (PRILOSEC) 40 MG capsule Take 40 mg by mouth in the morning and at bedtime.  . potassium chloride SA (KLOR-CON) 20 MEQ tablet Take 20 mEq by mouth daily.  . [DISCONTINUED] metoprolol tartrate (LOPRESSOR) 12.5 mg TABS tablet Take 12.5 mg by mouth 2 (two) times daily.  . [DISCONTINUED] potassium chloride (KLOR-CON) 10 MEQ tablet Take 2 tablets (20 mEq total) by mouth daily.   No facility-administered encounter medications on file as of 11/22/2020.     SIGNIFICANT DIAGNOSTIC EXAMS  PREVIOUS   08-13-20: ct head:  No acute abnormality. Atrophy and chronic microvascular ischemic disease.  08-13-20: ct angio of head and neck:  1. 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 2. 26 mL ischemic penumbra in the left MCA territory without evidence of a core infarct. 3. Mild-to-moderate cervical carotid atherosclerosis without significant stenosis. 4. Aortic Atherosclerosis   08-14-20: carotid doppler: Color duplex indicates minimal heterogeneous and calcified plaque, with no hemodynamically significant stenosis by duplex criteria in the extracranial cerebrovascular circulation.  08-17-20: ct of chest; abdomen and pelvis Chest Impression: 1. Scattered peribronchial thickening in LEFT and RIGHT lung suggest  mild pulmonary inflammation or infection. 2. Focus of consolidation with pleural fluid at the RIGHT lung base differential include pneumonia or round atelectasis.   Abdomen / Pelvis Impression: 1. Circumferential submucosal thickening over 6 cm segment of the ascending colon. 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 Recommend endoscopy for further evaluation. 2. No evidence of metastatic adenopathy other than a small  retrocrural lymph node which is favored unrelated 3. No liver metastasis. 4. Multiple diverticula bladder.  NO NEW EXAMS.   LABS REVIEWED PREVIOUS   08-13-20; wbc 7.5; hgb 9.4; hct 31.8; mcv 95.8 plt 270; glucose 122; bun 24; creat 1.28; k+ 3.9; na++ 135; ca 8.7 liver normal albumin 2.9 08-14-20; hgb a1c 4.7; chol 134; ldl 66; trig 96; hdl 49 08-17-20: wbc 7.7; hgb 9.4; hct 30.4; mcv 94.4 plt 242; glucose 111; bun 17; creat 1.16; k+ 3.7; na++ 136; ca 8.9 liver normal albumin 2.7  09-05-20: wbc 8.0; hgb 9.8; hct 31.5; mcv 94.6 plt 245; glucose 123; bun 22; creat 1.55; k+ 4.1; na++ 131; ca 8.5 GFR 21 liver normal albumin 2.9; iron 26; ferritin 222; vit B 12: >7500 09-17-20: tsh 1.637   TODAY  11-09-20: wbc 4.4; hgb 8.7; hct 29.4; mcv 97.4 plt 180; glucose 99; bun 24; creat 1.16; k+ 4.1; na++ 134; ca 7.8; GFR 45; alk phos 136; albumin 2.2; CEA 8.1; iron 30; tibc 187; ferritin 120    Review of Systems  Unable to perform ROS: Dementia (unable to participate )   Physical Exam Constitutional:      General: She is not in acute distress.    Appearance: She is well-developed. She is not diaphoretic.  Neck:     Thyroid: No thyromegaly.  Cardiovascular:     Rate and Rhythm: Normal rate and regular rhythm.     Heart sounds: Normal heart sounds.     Comments: Pacemaker  Pulmonary:     Effort: Pulmonary effort is normal. No respiratory distress.     Breath sounds: Normal breath sounds.  Abdominal:     General: Bowel sounds are normal. There is no distension.     Palpations:  Abdomen is soft.     Tenderness: There is no abdominal tenderness.  Musculoskeletal:        General: Normal range of motion.     Cervical back: Neck supple.     Right lower leg: No edema.     Left lower leg: No edema.  Lymphadenopathy:     Cervical: No cervical adenopathy.  Skin:    General: Skin is warm and dry.  Neurological:     Mental Status: She is alert. Mental status is at baseline.  Psychiatric:        Mood and Affect: Mood normal.      ASSESSMENT/ PLAN:  TODAY  1. 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 Pulmonary emphysema unspecified emphysema type: is stable will continue breztri aerosphere 160-4.8 mcg 2 puffs twice daily albuterol 2 puffs every 4 hours as needed; xopenex 1.25 mg neb every 4 hours as needed  3. Colon cancer of colon unspecified part of colon: due to her advanced age and dementia; no aggressive workup or treatment CEA 8.1   4. Gastroesophageal reflux disease without esophagitis: is stable will continue protonix 40 mg twice daily    PREVIOUS   5. Alzheimer's disease without behavioral disturbance unspecified age of onset: is losing weight her  weight is 97 pounds will monitor  Unfortunately weight loss is an expected outcome at the late stages of this disease process.   6. Moderate protein calorie malnutrition: is without change albumin 2.2 will continue supplements as directed  7. Iron deficiency anemia due to chronic blood loss: is stable hgb 8.7 will continue niferex daily  8. Chronic constipation: is stable will continue colace twice daily   9. Vitamin B 12 deficiency: is stable level is >7500 will monitor   10. 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 Aortic atherosclerosis: (ct 08-17-20) will monitor   12. Chronic diastolic  congestive heart failure: is stable EF 60-65% (08-16-20) will continue lasix 40 mg daily with k+ 20 meq daily   13. Cerebral vascular accident (CVA) unspecified mechanism is neurologically stable will monitor  14. PAF (paraxysmal atrial fibrillation) heart rate is stable; status post pace maker will continue lopressor 12.5 mg twice daily for rate control     Ok Edwards NP [facility]  Contact [phone] Monday through Friday 8am- 5pm  After hours call [phone]

## 2020-11-23 ENCOUNTER — Inpatient Hospital Stay (HOSPITAL_COMMUNITY): Payer: Medicare Other | Attending: Oncology

## 2020-11-23 ENCOUNTER — Encounter (HOSPITAL_COMMUNITY): Payer: Self-pay

## 2020-11-23 ENCOUNTER — Encounter: Payer: Medicare Other | Admitting: Internal Medicine

## 2020-11-23 VITALS — BP 116/92 | HR 64 | Temp 96.9°F | Resp 16

## 2020-11-23 DIAGNOSIS — Z79899 Other long term (current) drug therapy: Secondary | ICD-10-CM | POA: Insufficient documentation

## 2020-11-23 DIAGNOSIS — N189 Chronic kidney disease, unspecified: Secondary | ICD-10-CM | POA: Diagnosis not present

## 2020-11-23 DIAGNOSIS — I129 Hypertensive chronic kidney disease with stage 1 through stage 4 chronic kidney disease, or unspecified chronic kidney disease: Secondary | ICD-10-CM | POA: Insufficient documentation

## 2020-11-23 DIAGNOSIS — D631 Anemia in chronic kidney disease: Secondary | ICD-10-CM | POA: Insufficient documentation

## 2020-11-23 DIAGNOSIS — D500 Iron deficiency anemia secondary to blood loss (chronic): Secondary | ICD-10-CM

## 2020-11-23 MED ORDER — FERUMOXYTOL INJECTION 510 MG/17 ML
510.0000 mg | Freq: Once | INTRAVENOUS | Status: AC
  Administered 2020-11-23: 510 mg via INTRAVENOUS
  Filled 2020-11-23: qty 17

## 2020-11-23 MED ORDER — SODIUM CHLORIDE 0.9 % IV SOLN: Freq: Once | INTRAVENOUS | Status: AC

## 2020-11-23 MED ORDER — FAMOTIDINE 20 MG PO TABS
ORAL_TABLET | ORAL | Status: AC
  Filled 2020-11-23: qty 1

## 2020-11-23 MED ORDER — FAMOTIDINE 20 MG PO TABS
20.0000 mg | ORAL_TABLET | Freq: Once | ORAL | Status: AC
  Administered 2020-11-23: 20 mg via ORAL

## 2020-11-23 MED ORDER — LORATADINE 10 MG PO TABS
10.0000 mg | ORAL_TABLET | Freq: Once | ORAL | Status: AC
  Administered 2020-11-23: 10 mg via ORAL

## 2020-11-23 MED ORDER — ACETAMINOPHEN 325 MG PO TABS
650.0000 mg | ORAL_TABLET | Freq: Once | ORAL | Status: AC
  Administered 2020-11-23: 650 mg via ORAL

## 2020-11-23 MED ORDER — LORATADINE 10 MG PO TABS
ORAL_TABLET | ORAL | Status: AC
  Filled 2020-11-23: qty 1

## 2020-11-23 MED ORDER — ACETAMINOPHEN 325 MG PO TABS
ORAL_TABLET | ORAL | Status: AC
  Filled 2020-11-23: qty 2

## 2020-11-23 NOTE — Progress Notes (Signed)
Patient presents today for Feraheme infusion. Vital signs stable. Patient's daughter at the bedside. MAR reviewed and updated. Patient has no complaints today. Patient given Premedications prior to [facility]. Patient denies any pain at this time.   Feraheme given today per MD orders. Tolerated infusion without adverse affects. Vital signs stable. No complaints at this time. Discharged from clinic via wheel chair in stable condition. Alert and oriented x 3. F/U with [facility] as scheduled.

## 2020-11-23 NOTE — Patient Instructions (Signed)
[facility]  Discharge Instructions: Thank you for choosing [facility] to provide your oncology and hematology care.  If you have a lab appointment with the [facility], please come in thru the Main Entrance and check in at the main information desk.  We strive to give you quality time with your provider. You may need to reschedule your appointment if you arrive late (15 or more minutes).  Arriving late affects you and other patients whose appointments are after yours.  Also, if you miss three or more appointments without notifying the office, you may be dismissed from the clinic at the provider's discretion.      For prescription refill requests, have your pharmacy contact our office and allow 72 hours for refills to be completed.    Today you received the Feraheme iron infusion.    To help prevent nausea and vomiting after your treatment, we encourage you to take your nausea medication as directed.  BELOW ARE SYMPTOMS THAT SHOULD BE REPORTED IMMEDIATELY: . *FEVER GREATER THAN 100.4 F (38 C) OR HIGHER . *CHILLS OR SWEATING . *NAUSEA AND VOMITING THAT IS NOT CONTROLLED WITH YOUR NAUSEA MEDICATION . *UNUSUAL SHORTNESS OF BREATH . *UNUSUAL BRUISING OR BLEEDING . *URINARY PROBLEMS (pain or burning when urinating, or frequent urination) . *BOWEL PROBLEMS (unusual diarrhea, constipation, pain near the anus) . TENDERNESS IN MOUTH AND THROAT WITH OR WITHOUT PRESENCE OF ULCERS (sore throat, sores in mouth, or a toothache) . UNUSUAL RASH, SWELLING OR PAIN  . UNUSUAL VAGINAL DISCHARGE OR ITCHING   Items with * indicate a potential emergency and should be followed up as soon as possible or go to the Emergency Department if any problems should occur.  Please show the CHEMOTHERAPY ALERT CARD or IMMUNOTHERAPY ALERT CARD at check-in to the Emergency Department and triage nurse.  Should you have questions after your visit or need to cancel or reschedule your appointment,  please contact [facility] [phone]  and follow the prompts.  Office hours are 8:00 a.m. to 4:30 p.m. Monday - Friday. Please note that voicemails left after 4:00 p.m. may not be returned until the following business day.  We are closed weekends and major holidays. You have access to a nurse at all times for urgent questions. Please call the main number to the clinic [phone] and follow the prompts.  For any non-urgent questions, you may also contact your provider using MyChart. We now offer e-Visits for anyone 44 and older to request care online for non-urgent symptoms. For details visit [email or web address].   Also download the MyChart app! Go to the app store, search "MyChart", open the app, select [facility], and log in with your MyChart username and password.  Due to Covid, a mask is required upon entering the hospital/clinic. If you do not have a mask, one will be given to you upon arrival. For doctor visits, patients may have 1 support person aged 30 or older with them. For treatment visits, patients cannot have anyone with them due to current Covid guidelines and our immunocompromised population.

## 2020-12-01 ENCOUNTER — Encounter: Payer: Self-pay | Admitting: Adult Health

## 2020-12-01 ENCOUNTER — Non-Acute Institutional Stay (SKILLED_NURSING_FACILITY): Payer: Medicare Other | Admitting: Adult Health

## 2020-12-01 DIAGNOSIS — I700 Atherosclerosis of aorta: Secondary | ICD-10-CM

## 2020-12-01 DIAGNOSIS — C189 Malignant neoplasm of colon, unspecified: Secondary | ICD-10-CM | POA: Diagnosis not present

## 2020-12-01 DIAGNOSIS — I639 Cerebral infarction, unspecified: Secondary | ICD-10-CM

## 2020-12-01 NOTE — Progress Notes (Signed)
Location:  [place] Room Number: 142-D Place of Service:  SNF (31)   CODE STATUS: DNR  Allergies  Allergen Reactions   Hctz [Hydrochlorothiazide] Other (See Comments)    Pt was ill and this affected her kidneys    Aspirin Other (See Comments)    Cardiologist said the patient is to not take this   Codeine Other (See Comments)    Made the patient feel ill, has not had any problems since 1977    Chief Complaint  Patient presents with   Acute Visit    Care plan meeting.    HPI:  We have come together for her care plan meeting. no BIMS: mood; 3/30: decreased interest; poor appetite; short tempered. She is dependent with all of her adls; she requires assist with her meals. She is incontinent of bladder and bowel. There have been no falls.  Therapy: none at this time.     Dietary: she is losing weight with her current weght at 93.6 pounds down 10% in one month. Is on a regular diet.  is on supplements for albumin of 2.2. she continues to be followed for her chronic illnesses including: Cerebrovascular accident unspecified mechanism Aortic atherosclerosis Malignant neoplasm of colon unspecified part of colon  Past Medical History:  Diagnosis Date   AKI (acute kidney injury) ([place])    Anemia    Aortic stenosis    Status post TAVR 2017   Arthritis    Asthma    Atrial fibrillation (HCC)    AVM (arteriovenous malformation) of small bowel, acquired    CKD (chronic kidney disease) stage 3, GFR 30-59 ml/min (HCC)    Colon cancer (HCC)    Dementia (HCC)    Essential hypertension    GERD (gastroesophageal reflux disease)    GI bleed    Glaucoma    Hearing loss    History of pneumonia    Pacemaker    [employer]tr[employer]   Recurrent UTI    Stroke [facility])    SVT (supraventricular tachycardia) (HCC)    S/P ablation of AVNRT in 2003    Past Surgical History:  Procedure Laterality Date   APPENDECTOMY     BIOPSY  04/07/2018   Procedure: BIOPSY;  Surgeon: Jerene Bears,  MD;  Location: [place];  Service: Gastroenterology;;   BIOPSY  02/19/2020   Procedure: BIOPSY;  Surgeon: Harvel Quale, MD;  [facility];  Service: Gastroenterology;;  gastric    BLADDER SURGERY     CARDIAC CATHETERIZATION N/A 09/26/2015   Procedure: Right/Left Heart Cath and Coronary Angiography;  Surgeon: Burnell Blanks, MD;  Location: [facility] [facility];  Service: Cardiovascular;  Laterality: N/A;   CARDIAC SURGERY     CARDIOVERSION N/A 03/02/2016   Procedure: CARDIOVERSION;  Surgeon: Thayer Headings, MD;  Location: [place];  Service: Cardiovascular;  Laterality: N/A;   ENTEROSCOPY  02/19/2020   Procedure: ENTEROSCOPY;  Surgeon: Harvel Quale, MD;  [facility];  Service: Gastroenterology;;   EP IMPLANTABLE DEVICE N/A 10/26/2015   Procedure: Pacemaker Implant;  Surgeon: Will Meredith Leeds, MD;  Location: [place] [facility];  Service: Cardiovascular;  Laterality: N/A;   ESOPHAGOGASTRODUODENOSCOPY (EGD) WITH PROPOFOL N/A 04/07/2018   Procedure: ESOPHAGOGASTRODUODENOSCOPY (EGD) WITH PROPOFOL;  Surgeon: Jerene Bears, MD;  Location: [facility];  Service: Gastroenterology;  Laterality: N/A;   ESOPHAGOGASTRODUODENOSCOPY (EGD) WITH PROPOFOL N/A 10/12/2019   Procedure: ESOPHAGOGASTRODUODENOSCOPY (EGD) WITH PROPOFOL;  Surgeon: Danie Binder, MD;  [facility];  Service: Endoscopy;  Laterality: N/A;   EYE SURGERY Bilateral    cataract surgery   FLEXIBLE SIGMOIDOSCOPY  02/19/2020   Procedure: FLEXIBLE SIGMOIDOSCOPY;  Surgeon: Harvel Quale, MD;  [facility];  Service: Gastroenterology;;   HIP FRACTURE SURGERY Left 02/2020   HOT HEMOSTASIS N/A 04/07/2018   Procedure: HOT HEMOSTASIS (ARGON PLASMA COAGULATION/BICAP);  Surgeon: Jerene Bears, MD;  Location: [facility] ENDOSCOPY;  Service: Gastroenterology;  Laterality: N/A;   HOT HEMOSTASIS  10/12/2019   Procedure: HOT HEMOSTASIS (ARGON PLASMA COAGULATION/BICAP);   Surgeon: Danie Binder, MD;  [facility];  Service: Endoscopy;;   INTRAMEDULLARY (IM) NAIL INTERTROCHANTERIC Left 03/10/2020   Procedure: OPEN TREATMENT INTERNAL FIXATION LEFT HIP (WITH GAMMA NAIL);  Surgeon: Carole Civil, MD;  Location: [facility];  Service: Orthopedics;  Laterality: Left;   NASAL SINUS SURGERY     PACEMAKER INSERTION     TEE WITHOUT CARDIOVERSION N/A 10/25/2015   Procedure: TRANSESOPHAGEAL ECHOCARDIOGRAM (TEE);  Surgeon: Burnell Blanks, MD;  Location: [place];  Service: Open Heart Surgery;  Laterality: N/A;   TRANSCATHETER AORTIC VALVE REPLACEMENT, TRANSFEMORAL Right 10/25/2015   Procedure: TRANSCATHETER AORTIC VALVE REPLACEMENT, TRANSFEMORAL;  Surgeon: Burnell Blanks, MD;  Location: [place];  Service: Open Heart Surgery;  Laterality: Right;    Social History   Socioeconomic History   Marital status: Widowed    Spouse name: Not on file   Number of children: 3   Years of education: Not on file   Highest education level: 12th grade  Occupational History   Occupation: Retired  Tobacco Use   Smoking status: Never   Smokeless tobacco: Never  Vaping Use   Vaping Use: Never used  Substance and Sexual Activity   Alcohol use: No    Alcohol/week: 0.0 standard drinks   Drug use: No   Sexual activity: Not on file  Other Topics Concern   Not on file  Social History Narrative   Not on file   Social Determinants of Health   Financial Resource Strain: Not on file  Food Insecurity: Not on file  Transportation Needs: Not on file  Physical Activity: Not on file  Stress: Not on file  Social Connections: Not on file  Intimate Partner Violence: Not on file   Family History  Problem Relation Age of Onset   Hypertension Mother    Pneumonia Father    Stomach cancer Brother    Stroke Brother    AAA (abdominal aortic aneurysm) Brother    Cervical cancer Daughter    Heart attack Neg Hx    Colon cancer Neg Hx    Esophageal cancer Neg Hx        VITAL SIGNS BP (!) 105/54   Pulse 62   Temp 98.1 F (36.7 C)   Resp (!) 22   Ht _0  (1.549 m)   Wt 93 lb 12.8 oz (42.5 kg)   SpO2 93%   BMI 17.72 kg/m   Outpatient Encounter Medications as of 12/01/2020  Medication Sig   acetaminophen (TYLENOL) 325 MG tablet Take 2 tablets (650 mg total) by mouth every 4 (four) hours as needed for mild pain (or temp > 37.5 C (99.5 F)).   albuterol (PROAIR HFA) 108 (90 Base) MCG/ACT inhaler Inhale 2 puffs into the lungs every 4 (four) hours as needed for wheezing or shortness of breath.   Amino Acids-Protein Hydrolys (FEEDING SUPPLEMENT, PRO-STAT SUGAR FREE 64,) LIQD Take 30 mLs by mouth 3 (  three) times daily with meals. for albumin of 2.2   Balsam [place]-Castor Oil OINT Apply 1 application topically daily. Apply to sacrum and bilateral buttocks every shift   Biotin 10 MG CAPS Take 10 mg by mouth daily.    Budeson-Glycopyrrol-Formoterol (BREZTRI AEROSPHERE) 160-9-4.8 MCG/ACT AERO Inhale 2 Inhalers into the lungs in the morning and at bedtime.   busPIRone (BUSPAR) 5 MG tablet Take 5 mg by mouth 3 (three) times daily.   Calcium Carb-Cholecalciferol (CALTRATE 600+D3) 600-800 MG-UNIT TABS Take 1 tablet by mouth daily.   docusate sodium (COLACE) 100 MG capsule Take 1 capsule (100 mg total) by mouth 2 (two) times daily.   famotidine (PEPCID) 20 MG tablet Take 1 tablet (20 mg total) by mouth at bedtime.   Fe Bisgly-Fe Polysac-Vit C (NIFEREX-150 PO) Take by mouth. Take 150 mg iron- 60 mg-1 mg; oral,Once A Day; 09:00 AM   feeding supplement (ENSURE ENLIVE / ENSURE PLUS) LIQD Take by mouth once.   folic acid (FOLVITE) 1 MG tablet Take 1 tablet (1 mg total) by mouth daily.   furosemide (LASIX) 40 MG tablet Take 1 tablet (40 mg total) by mouth daily.   levalbuterol (XOPENEX) 1.25 MG/3ML nebulizer solution Take 1.25 mg by nebulization every 4 (four) hours as needed for wheezing. Dx J45.909   levothyroxine (SYNTHROID) 25 MCG tablet Take 25 mcg by mouth daily  before breakfast.   loratadine (CLARITIN) 10 MG tablet Take 10 mg by mouth daily.   memantine (NAMENDA) 10 MG tablet Take 1 tablet (10 mg total) by mouth 2 (two) times daily.   metoprolol tartrate (LOPRESSOR) 25 MG tablet Take 12.5 mg by mouth 2 (two) times daily. HOLD FOR SYSTOLIC B/P <= 564 [DX: Paroxysmal atrial fibrillation]   NON FORMULARY Regular diet   omeprazole (PRILOSEC) 40 MG capsule Take 40 mg by mouth in the morning and at bedtime.   potassium chloride SA (KLOR-CON) 20 MEQ tablet Take 20 mEq by mouth daily.   No facility-administered encounter medications on file as of 12/01/2020.     SIGNIFICANT DIAGNOSTIC EXAMS   PREVIOUS   08-13-20: ct head:  No acute abnormality. Atrophy and chronic microvascular ischemic disease.  08-13-20: ct angio of head and neck:  1. 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 2. 26 mL ischemic penumbra in the left MCA territory without evidence of a core infarct. 3. Mild-to-moderate cervical carotid atherosclerosis without significant stenosis. 4. Aortic Atherosclerosis   08-14-20: carotid doppler: Color duplex indicates minimal heterogeneous and calcified plaque, with no hemodynamically significant stenosis by duplex criteria in the extracranial cerebrovascular circulation.  08-17-20: ct of chest; abdomen and pelvis Chest Impression: 1. Scattered peribronchial thickening in LEFT and RIGHT lung suggest mild pulmonary inflammation or infection. 2. Focus of consolidation with pleural fluid at the RIGHT lung base differential include pneumonia or round atelectasis.   Abdomen / Pelvis Impression: 1. Circumferential submucosal thickening over 6 cm segment of the ascending colon. 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 Recommend endoscopy for further evaluation. 2. No evidence of metastatic adenopathy other than a small retrocrural lymph node which is favored unrelated 3. No liver metastasis. 4. Multiple diverticula bladder.  NO NEW  EXAMS.   LABS REVIEWED PREVIOUS   08-13-20; wbc 7.5; hgb 9.4; hct 31.8; mcv 95.8 plt 270; glucose 122; bun 24; creat 1.28; k+ 3.9; na++ 135; ca 8.7 liver normal albumin 2.9 08-14-20; hgb a1c 4.7; chol 134; ldl 66; trig 96; hdl 49 08-17-20: wbc 7.7; hgb 9.4; hct 30.4; mcv 94.4 plt 242;  glucose 111; bun 17; creat 1.16; k+ 3.7; na++ 136; ca 8.9 liver normal albumin 2.7  09-05-20: wbc 8.0; hgb 9.8; hct 31.5; mcv 94.6 plt 245; glucose 123; bun 22; creat 1.55; k+ 4.1; na++ 131; ca 8.5 GFR 21 liver normal albumin 2.9; iron 26; ferritin 222; vit B 12: >7500 09-17-20: tsh 1.637  11-09-20: wbc 4.4; hgb 8.7; hct 29.4; mcv 97.4 plt 180; glucose 99; bun 24; creat 1.16; k+ 4.1; na++ 134; ca 7.8; GFR 45; alk phos 136; albumin 2.2; CEA 8.1; iron 30; tibc 187; ferritin 120   NO NEW LABS.   Review of Systems  Unable to perform ROS: Dementia (unable to participate)   Physical Exam Constitutional:      General: She is not in acute distress.    Appearance: She is well-developed. She is not diaphoretic.  Neck:     Thyroid: No thyromegaly.  Cardiovascular:     Rate and Rhythm: Normal rate and regular rhythm.     Heart sounds: Normal heart sounds.     Comments: Pace maker  Pulmonary:     Effort: Pulmonary effort is normal. No respiratory distress.     Breath sounds: Normal breath sounds.  Abdominal:     General: Bowel sounds are normal. There is no distension.     Palpations: Abdomen is soft.     Tenderness: There is no abdominal tenderness.  Musculoskeletal:        General: Normal range of motion.     Cervical back: Neck supple.     Right lower leg: No edema.     Left lower leg: No edema.  Lymphadenopathy:     Cervical: No cervical adenopathy.  Skin:    General: Skin is warm and dry.  Neurological:     Mental Status: She is alert. Mental status is at baseline.  Psychiatric:        Mood and Affect: Mood normal.     ASSESSMENT/ PLAN:  TODAY  Cerebrovascular accident unspecified  mechanism Aortic atherosclerosis Malignant neoplasm of colon unspecified part of colon  Will continue current medications Will continue current plan of care Will continue to monitor her status.   Time spent with patient: 40 minutes: discussed overall status; goals of care; medications.    Ok Edwards NP [facility]  Contact [phone] Monday through Friday 8am- 5pm  After hours call [phone]

## 2020-12-05 ENCOUNTER — Ambulatory Visit (INDEPENDENT_AMBULATORY_CARE_PROVIDER_SITE_OTHER): Payer: Medicare Other

## 2020-12-05 DIAGNOSIS — I495 Sick sinus syndrome: Secondary | ICD-10-CM | POA: Diagnosis not present

## 2020-12-06 LAB — CUP PACEART REMOTE DEVICE CHECK
Battery Remaining Longevity: 36 mo
Battery Voltage: 2.98 V
Brady Statistic RA Percent Paced: 0 %
Brady Statistic RV Percent Paced: 30.23 %
Date Time Interrogation Session: 20220613154802
Implantable Lead Implant Date: 20170503
Implantable Lead Implant Date: 20170503
Implantable Lead Location: 753859
Implantable Lead Location: 753860
Implantable Lead Model: 5076
Implantable Lead Model: 5076
Implantable Pulse Generator Implant Date: 20170503
Lead Channel Impedance Value: 285 Ohm
Lead Channel Impedance Value: 342 Ohm
Lead Channel Impedance Value: 380 Ohm
Lead Channel Impedance Value: 418 Ohm
Lead Channel Pacing Threshold Amplitude: 0.875 V
Lead Channel Pacing Threshold Amplitude: 1 V
Lead Channel Pacing Threshold Pulse Width: 0.4 ms
Lead Channel Pacing Threshold Pulse Width: 0.4 ms
Lead Channel Sensing Intrinsic Amplitude: 3.125 mV
Lead Channel Sensing Intrinsic Amplitude: 3.125 mV
Lead Channel Sensing Intrinsic Amplitude: 7.875 mV
Lead Channel Sensing Intrinsic Amplitude: 7.875 mV
Lead Channel Setting Pacing Amplitude: 2 V
Lead Channel Setting Pacing Amplitude: 2.5 V
Lead Channel Setting Pacing Pulse Width: 0.4 ms
Lead Channel Setting Sensing Sensitivity: 2.8 mV

## 2020-12-21 ENCOUNTER — Encounter: Payer: Self-pay | Admitting: Adult Health

## 2020-12-21 ENCOUNTER — Non-Acute Institutional Stay (SKILLED_NURSING_FACILITY): Payer: Medicare Other | Admitting: Adult Health

## 2020-12-21 DIAGNOSIS — D508 Other iron deficiency anemias: Secondary | ICD-10-CM | POA: Diagnosis not present

## 2020-12-21 DIAGNOSIS — K5909 Other constipation: Secondary | ICD-10-CM | POA: Diagnosis not present

## 2020-12-21 DIAGNOSIS — F0280 Dementia in other diseases classified elsewhere without behavioral disturbance: Secondary | ICD-10-CM

## 2020-12-21 DIAGNOSIS — I69398 Other sequelae of cerebral infarction: Secondary | ICD-10-CM | POA: Diagnosis not present

## 2020-12-21 DIAGNOSIS — L03114 Cellulitis of left upper limb: Secondary | ICD-10-CM

## 2020-12-21 DIAGNOSIS — E440 Moderate protein-calorie malnutrition: Secondary | ICD-10-CM

## 2020-12-21 DIAGNOSIS — Z1159 Encounter for screening for other viral diseases: Secondary | ICD-10-CM | POA: Diagnosis not present

## 2020-12-21 DIAGNOSIS — G309 Alzheimer's disease, unspecified: Secondary | ICD-10-CM

## 2020-12-21 DIAGNOSIS — R627 Adult failure to thrive: Secondary | ICD-10-CM | POA: Diagnosis not present

## 2020-12-21 NOTE — Progress Notes (Signed)
Location:  [place] Room Number: 142 Place of Service:  SNF (31)   CODE STATUS: dnr   Allergies  Allergen Reactions  . Hctz [Hydrochlorothiazide] Other (See Comments)    Pt was ill and this affected her kidneys   . Aspirin Other (See Comments)    Cardiologist said the patient is to not take this  . Codeine Other (See Comments)    Made the patient feel ill, has not had any problems since 1977    Chief Complaint  Patient presents with  . Medical Management of Chronic Issues        Alzheimer's disease without behavioral disturbance; unspecified age of onset:     Failure to thrive in adult/moderate protein calorie malnutrition:     Iron deficiency anemia due to chronic blood loss:     Chronic constipation    HPI:  She is a 87 year old long term resident of this facility being seen for the management of her chronic illnesses; Alzheimer's disease without behavioral disturbance; unspecified age of onset:     Failure to thrive in adult/moderate protein calorie malnutrition:     Iron deficiency anemia due to chronic blood loss:     Chronic constipation. She continues to decline; she will decline medications at times. Her left arm abrasion is red hot and painful to touch. No reports of fevers present.   Past Medical History:  Diagnosis Date  . AKI (acute kidney injury) ([place])   . Anemia   . Aortic stenosis    Status post TAVR 2017  . Arthritis   . Asthma   . Atrial fibrillation ([place])   . AVM (arteriovenous malformation) of small bowel, acquired   . CKD (chronic kidney disease) stage 3, GFR 30-59 ml/min (HCC)   . Colon cancer ([place])   . Dementia ([place])   . Essential hypertension   . GERD (gastroesophageal reflux disease)   . GI bleed   . Glaucoma   . Hearing loss   . History of pneumonia   . Pacemaker    Medtronic  . Recurrent UTI   . Stroke ([place])   . SVT (supraventricular tachycardia) (HCC)    S/P ablation of AVNRT in 2003    Past Surgical  History:  Procedure Laterality Date  . APPENDECTOMY    . BIOPSY  04/07/2018   Procedure: BIOPSY;  Surgeon: Jerene Bears, MD;  Location: [facility] ENDOSCOPY;  Service: Gastroenterology;;  . BIOPSY  02/19/2020   Procedure: BIOPSY;  Surgeon: Harvel Quale, MD;  [facility] END[facility];  Service: Gastroenterology;;  gastric   . BLADDER SURGERY    . CARDIAC CATHETERIZATION N/A 09/26/2015   Procedure: Right/Left Heart Cath and Coronary Angiography;  Surgeon: Burnell Blanks, MD;  Location: [place] CV LAB;  Service: Cardiovascular;  Laterality: N/A;  . CARDIAC SURGERY    . CARDIOVERSION N/A 03/02/2016   Procedure: CARDIOVERSION;  Surgeon: Thayer Headings, MD;  Location: [facility] ENDOSCOPY;  Service: Cardiovascular;  Laterality: N/A;  . ENTEROSCOPY  02/19/2020   Procedure: ENTEROSCOPY;  Surgeon: Harvel Quale, MD;  [facility]: [facility] ENDO SUITE;  Service: Gastroenterology;;  . EP IMPLANTABLE DEVICE N/A 10/26/2015   Procedure: Pacemaker Implant;  Surgeon: Will Meredith Leeds, MD;  Location: [place] CV LAB;  Service: Cardiovascular;  Laterality: N/A;  . ESOPHAGOGASTRODUODENOSCOPY (EGD) WITH PROPOFOL N/A 04/07/2018   Procedure: ESOPHAGOGASTRODUODENOSCOPY (EGD) WITH PROPOFOL;  Surgeon: Jerene Bears, MD;  Location: [facility] ENDOSCOPY;  Service: Gastroenterology;  Laterality: N/A;  .  ESOPHAGOGASTRODUODENOSCOPY (EGD) WITH PROPOFOL N/A 10/12/2019   Procedure: ESOPHAGOGASTRODUODENOSCOPY (EGD) WITH PROPOFOL;  Surgeon: Danie Binder, MD;  [facility] ENDO [facility];  Service: Endoscopy;  Laterality: N/A;  . EYE SURGERY Bilateral    cataract surgery  . FLEXIBLE SIGMOIDOSCOPY  02/19/2020   Procedure: FLEXIBLE SIGMOIDOSCOPY;  Surgeon: Harvel Quale, MD;  [facility];  Service: Gastroenterology;;  . HIP FRACTURE SURGERY Left 02/2020  . HOT HEMOSTASIS N/A 04/07/2018   Procedure: HOT HEMOSTASIS (ARGON PLASMA COAGULATION/BICAP);  Surgeon: Jerene Bears, MD;  Location: [facility] ENDOSCOPY;   Service: Gastroenterology;  Laterality: N/A;  . HOT HEMOSTASIS  10/12/2019   Procedure: HOT HEMOSTASIS (ARGON PLASMA COAGULATION/BICAP);  Surgeon: Danie Binder, MD;  [facility]: [facility] END[facility];  Service: Endoscopy;;  . INTRAMEDULLARY (IM) NAIL INTERTROCHANTERIC Left 03/10/2020   Procedure: OPEN TREATMENT INTERNAL FIXATION LEFT HIP (WITH GAMMA NAIL);  Surgeon: Carole Civil, MD;  Location: [facility] ORS;  Service: Orthopedics;  Laterality: Left;  . NASAL SINUS SURGERY    . PACEMAKER INSERTION    . TEE WITHOUT CARDIOVERSION N/A 10/25/2015   Procedure: TRANSESOPHAGEAL ECHOCARDIOGRAM (TEE);  Surgeon: Burnell Blanks, MD;  Location: [place];  Service: Open Heart Surgery;  Laterality: N/A;  . TRANSCATHETER AORTIC VALVE REPLACEMENT, TRANSFEMORAL Right 10/25/2015   Procedure: TRANSCATHETER AORTIC VALVE REPLACEMENT, TRANSFEMORAL;  Surgeon: Burnell Blanks, MD;  Location: [place];  Service: Open Heart Surgery;  Laterality: Right;    Social History   Socioeconomic History  . Marital status: Widowed    Spouse name: Not on file  . Number of children: 3  . Years of education: Not on file  . Highest education level: 12th grade  Occupational History  . Occupation: Retired  Tobacco Use  . Smoking status: Never  . Smokeless tobacco: Never  Vaping Use  . Vaping Use: Never used  Substance and Sexual Activity  . Alcohol use: No    Alcohol/week: 0.0 standard drinks  . Drug use: No  . Sexual activity: Not on file  Other Topics Concern  . Not on file  Social History Narrative  . Not on file   Social Determinants of Health   Financial Resource Strain: Not on file  Food Insecurity: Not on file  Transportation Needs: Not on file  Physical Activity: Not on file  Stress: Not on file  Social Connections: Not on file  Intimate Partner Violence: Not on file   Family History  Problem Relation Age of Onset  . Hypertension Mother   . Pneumonia Father   . Stomach cancer Brother   . Stroke Brother    . AAA (abdominal aortic aneurysm) Brother   . Cervical cancer Daughter   . Heart attack Neg Hx   . Colon cancer Neg Hx   . Esophageal cancer Neg Hx       VITAL SIGNS BP 126/74   Pulse 72   Temp 98.1 F (36.7 C)   Resp 18   Ht 5' 1" (1.549 m)   Wt 93 lb 12.8 oz (42.5 kg)   BMI 17.72 kg/m   Outpatient Encounter Medications as of 12/21/2020  Medication Sig  . acetaminophen (TYLENOL) 325 MG tablet Take 2 tablets (650 mg total) by mouth every 4 (four) hours as needed for mild pain (or temp > 37.5 C (99.5 F)).  [name] albuterol (PROAIR HFA) 108 (90 Base) MCG/ACT inhaler Inhale 2 puffs into the lungs every 4 (four) hours as needed for wheezing or shortness of breath.  . Amino  Acids-Protein Hydrolys (FEEDING SUPPLEMENT, PRO-STAT SUGAR FREE 64,) LIQD Take 30 mLs by mouth 3 (three) times daily with meals. for albumin of 2.2  . Balsam [place]-Castor Oil OINT Apply 1 application topically daily. Apply to sacrum and bilateral buttocks every shift  . Biotin 10 MG CAPS Take 10 mg by mouth daily.   . Budeson-Glycopyrrol-Formoterol (BREZTRI AEROSPHERE) 160-9-4.8 MCG/ACT AERO Inhale 2 Inhalers into the lungs in the morning and at bedtime.  . busPIRone (BUSPAR) 5 MG tablet Take 5 mg by mouth 3 (three) times daily.  . Calcium Carb-Cholecalciferol (CALTRATE 600+D3) 600-800 MG-UNIT TABS Take 1 tablet by mouth daily.  [name] docusate sodium (COLACE) 100 MG capsule Take 1 capsule (100 mg total) by mouth 2 (two) times daily.  . famotidine (PEPCID) 20 MG tablet Take 1 tablet (20 mg total) by mouth at bedtime.  [name] Fe Bisgly-Fe Polysac-Vit C (NIFEREX-150 PO) Take by mouth. Take 150 mg iron- 60 mg-1 mg; oral,Once A Day; 09:00 AM  . feeding supplement (ENSURE ENLIVE / ENSURE PLUS) LIQD Take by mouth once.  . folic acid (FOLVITE) 1 MG tablet Take 1 tablet (1 mg total) by mouth daily.  . furosemide (LASIX) 40 MG tablet Take 1 tablet (40 mg total) by mouth daily.  [name] levalbuterol (XOPENEX) 1.25 MG/3ML nebulizer solution Take  1.25 mg by nebulization every 4 (four) hours as needed for wheezing. Dx J45.909  . levothyroxine (SYNTHROID) 25 MCG tablet Take 25 mcg by mouth daily before breakfast.  . loratadine (CLARITIN) 10 MG tablet Take 10 mg by mouth daily.  . memantine (NAMENDA) 10 MG tablet Take 1 tablet (10 mg total) by mouth 2 (two) times daily.  . metoprolol tartrate (LOPRESSOR) 25 MG tablet Take 12.5 mg by mouth 2 (two) times daily. HOLD FOR SYSTOLIC B/P <= 465 [DX: Paroxysmal atrial fibrillation]  . NON FORMULARY Regular diet  . omeprazole (PRILOSEC) 40 MG capsule Take 40 mg by mouth in the morning and at bedtime.  . potassium chloride SA (KLOR-CON) 20 MEQ tablet Take 20 mEq by mouth daily.   No facility-administered encounter medications on file as of 12/21/2020.     SIGNIFICANT DIAGNOSTIC EXAMS   PREVIOUS   08-13-20: ct head:  No acute abnormality. Atrophy and chronic microvascular ischemic disease.  08-13-20: ct angio of head and neck:  1. 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 2. 26 mL ischemic penumbra in the left MCA territory without evidence of a core infarct. 3. Mild-to-moderate cervical carotid atherosclerosis without significant stenosis. 4. Aortic Atherosclerosis   08-14-20: carotid doppler: Color duplex indicates minimal heterogeneous and calcified plaque, with no hemodynamically significant stenosis by duplex criteria in the extracranial cerebrovascular circulation.  08-17-20: ct of chest; abdomen and pelvis Chest Impression: 1. Scattered peribronchial thickening in LEFT and RIGHT lung suggest mild pulmonary inflammation or infection. 2. Focus of consolidation with pleural fluid at the RIGHT lung base differential include pneumonia or round atelectasis.   Abdomen / Pelvis Impression: 1. Circumferential submucosal thickening over 6 cm segment of the ascending colon. 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 Recommend endoscopy for further evaluation. 2. No evidence of  metastatic adenopathy other than a small retrocrural lymph node which is favored unrelated 3. No liver metastasis. 4. Multiple diverticula bladder.  NO NEW EXAMS.   LABS REVIEWED PREVIOUS   08-13-20; wbc 7.5; hgb 9.4; hct 31.8; mcv 95.8 plt 270; glucose 122; bun 24; creat 1.28; k+ 3.9; na++ 135; ca 8.7 liver normal albumin 2.9 08-14-20; hgb a1c 4.7; chol 134; ldl 66;  trig 96; hdl 49 08-17-20: wbc 7.7; hgb 9.4; hct 30.4; mcv 94.4 plt 242; glucose 111; bun 17; creat 1.16; k+ 3.7; na++ 136; ca 8.9 liver normal albumin 2.7  09-05-20: wbc 8.0; hgb 9.8; hct 31.5; mcv 94.6 plt 245; glucose 123; bun 22; creat 1.55; k+ 4.1; na++ 131; ca 8.5 GFR 21 liver normal albumin 2.9; iron 26; ferritin 222; vit B 12: >7500 09-17-20: tsh 1.637  11-09-20: wbc 4.4; hgb 8.7; hct 29.4; mcv 97.4 plt 180; glucose 99; bun 24; creat 1.16; k+ 4.1; na++ 134; ca 7.8; GFR 45; alk phos 136; albumin 2.2; CEA 8.1; iron 30; tibc 187; ferritin 120   NO NEW LABS.    Review of Systems  Unable to perform ROS: Dementia (unable to participate)    Physical Exam Constitutional:      General: She is not in acute distress.    Appearance: She is cachectic. She is not diaphoretic.  Neck:     Thyroid: No thyromegaly.  Cardiovascular:     Rate and Rhythm: Normal rate and regular rhythm.     Heart sounds: Normal heart sounds.     Comments: Pace maker  Pulmonary:     Effort: Pulmonary effort is normal. No respiratory distress.     Breath sounds: Normal breath sounds.  Abdominal:     General: Bowel sounds are normal. There is no distension.     Palpations: Abdomen is soft.     Tenderness: There is no abdominal tenderness.  Musculoskeletal:        General: Normal range of motion.     Cervical back: Neck supple.     Right lower leg: No edema.     Left lower leg: No edema.  Lymphadenopathy:     Cervical: No cervical adenopathy.  Skin:    General: Skin is warm and dry.     Comments: Left forearm with small abrasion: is red hot  painful to touch   Neurological:     Mental Status: Mental status is at baseline.  Psychiatric:        Mood and Affect: Mood normal.     ASSESSMENT/ PLAN:  TODAY  Alzheimer's disease without behavioral disturbance; unspecified age of onset: is losing weight her current weight is 93 pounds; unfortunately this is an expected outcome int he late stages of this disease.   2. Failure to thrive in adult/moderate protein calorie malnutrition: is without significant change albumin is 2.2; she continues to lose weight.   3. Iron deficiency anemia due to chronic blood loss:  hgb 8.7 will continue niferex daily   4. Left arm cellulitis: is worse will begin rocephin 500 mg daily through 12-25-20.   5. Chronic constipation: is stable will continue colace twice daily    PREVIOUS   6. Vitamin B 12 deficiency: is stable level is >7500 will monitor   7. 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 Aortic atherosclerosis: (ct 08-17-20) will monitor   9. Chronic diastolic congestive heart failure: is stable EF 60-65% (08-16-20) will continue lasix 40 mg daily with k+ 20 meq daily   10. Cerebral vascular accident (CVA) unspecified mechanism is neurologically stable will monitor  12. PAF (paraxysmal atrial fibrillation) heart rate is stable; status post pace maker will continue lopressor 12.5 mg twice daily for rate control   13. 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: is stable  b/p 122/64 will continue lopressor 12.5 mg twice daily   14. Pulmonary emphysema unspecified emphysema type: is stable will continue breztri aerosphere 160-4.8 mcg 2 puffs twice daily albuterol 2 puffs every 4 hours as needed; xopenex 1.25 mg neb every 4 hours as needed  15. Colon cancer of colon unspecified part of colon: due to her advanced age and dementia; no aggressive workup or treatment  CEA 8.1   16. Gastroesophageal reflux disease without esophagitis: is stable will continue protonix 40 mg twice daily       Ok Edwards NP [facility]  Contact [phone] Monday through Friday 8am- 5pm  After hours call [phone]

## 2020-12-22 ENCOUNTER — Encounter: Payer: Self-pay | Admitting: Adult Health

## 2020-12-22 ENCOUNTER — Non-Acute Institutional Stay (SKILLED_NURSING_FACILITY): Payer: Medicare Other | Admitting: Adult Health

## 2020-12-22 DIAGNOSIS — I700 Atherosclerosis of aorta: Secondary | ICD-10-CM

## 2020-12-22 DIAGNOSIS — F0280 Dementia in other diseases classified elsewhere without behavioral disturbance: Secondary | ICD-10-CM

## 2020-12-22 DIAGNOSIS — C189 Malignant neoplasm of colon, unspecified: Secondary | ICD-10-CM

## 2020-12-22 DIAGNOSIS — G309 Alzheimer's disease, unspecified: Secondary | ICD-10-CM

## 2020-12-22 DIAGNOSIS — I639 Cerebral infarction, unspecified: Secondary | ICD-10-CM

## 2020-12-22 NOTE — Progress Notes (Signed)
Location:  [place] Room Number: 142 Place of Service:  SNF (31)   CODE STATUS: dnr  Allergies  Allergen Reactions  . Hctz [Hydrochlorothiazide] Other (See Comments)    Pt was ill and this affected her kidneys   . Aspirin Other (See Comments)    Cardiologist said the patient is to not take this  . Codeine Other (See Comments)    Made the patient feel ill, has not had any problems since 1977    Chief Complaint  Patient presents with  . Acute Visit    Care plan meeting     HPI:    Past Medical History:  Diagnosis Date  . AKI (acute kidney injury) ([place])   . Anemia   . Aortic stenosis    Status post TAVR 2017  . Arthritis   . Asthma   . Atrial fibrillation ([place])   . AVM (arteriovenous malformation) of small bowel, acquired   . CKD (chronic kidney disease) stage 3, GFR 30-59 ml/min (HCC)   . Colon cancer ([place])   . Dementia ([place])   . Essential hypertension   . GERD (gastroesophageal reflux disease)   . GI bleed   . Glaucoma   . Hearing loss   . History of pneumonia   . Pacemaker    Medtronic  . Recurrent UTI   . Stroke ([place])   . SVT (supraventricular tachycardia) (HCC)    S/P ablation of AVNRT in 2003    Past Surgical History:  Procedure Laterality Date  . APPENDECTOMY    . BIOPSY  04/07/2018   Procedure: BIOPSY;  Surgeon: Jerene Bears, MD;  Location: [facility] ENDOSCOPY;  Service: Gastroenterology;;  . BIOPSY  02/19/2020   Procedure: BIOPSY;  Surgeon: Harvel Quale, MD;  [facility];  Service: Gastroenterology;;  gastric   . BLADDER SURGERY    . CARDIAC CATHETERIZATION N/A 09/26/2015   Procedure: Right/Left Heart Cath and Coronary Angiography;  Surgeon: Burnell Blanks, MD;  Location: [place] CV LAB;  Service: Cardiovascular;  Laterality: N/A;  . CARDIAC SURGERY    . CARDIOVERSION N/A 03/02/2016   Procedure: CARDIOVERSION;  Surgeon: Thayer Headings, MD;  Location: [facility] ENDOSCOPY;  Service: Cardiovascular;   Laterality: N/A;  . ENTEROSCOPY  02/19/2020   Procedure: ENTEROSCOPY;  Surgeon: Harvel Quale, MD;  [facility];  Service: Gastroenterology;;  . EP IMPLANTABLE DEVICE N/A 10/26/2015   Procedure: Pacemaker Implant;  Surgeon: Will Meredith Leeds, MD;  Location: [place] CV LAB;  Service: Cardiovascular;  Laterality: N/A;  . ESOPHAGOGASTRODUODENOSCOPY (EGD) WITH PROPOFOL N/A 04/07/2018   Procedure: ESOPHAGOGASTRODUODENOSCOPY (EGD) WITH PROPOFOL;  Surgeon: Jerene Bears, MD;  Location: [facility] ENDOSCOPY;  Service: Gastroenterology;  Laterality: N/A;  . ESOPHAGOGASTRODUODENOSCOPY (EGD) WITH PROPOFOL N/A 10/12/2019   Procedure: ESOPHAGOGASTRODUODENOSCOPY (EGD) WITH PROPOFOL;  Surgeon: Danie Binder, MD;  [facility];  Service: Endoscopy;  Laterality: N/A;  . EYE SURGERY Bilateral    cataract surgery  . FLEXIBLE SIGMOIDOSCOPY  02/19/2020   Procedure: FLEXIBLE SIGMOIDOSCOPY;  Surgeon: Harvel Quale, MD;  [facility];  Service: Gastroenterology;;  . HIP FRACTURE SURGERY Left 02/2020  . HOT HEMOSTASIS N/A 04/07/2018   Procedure: HOT HEMOSTASIS (ARGON PLASMA COAGULATION/BICAP);  Surgeon: Jerene Bears, MD;  Location: [facility] ENDOSCOPY;  Service: Gastroenterology;  Laterality: N/A;  . HOT HEMOSTASIS  10/12/2019   Procedure: HOT HEMOSTASIS (ARGON PLASMA COAGULATION/BICAP);  Surgeon: Danie Binder, MD;  [facility];  Service: Endoscopy;;  . INTRAMEDULLARY (IM) NAIL INTERTROCHANTERIC Left 03/10/2020   Procedure: OPEN TREATMENT INTERNAL FIXATION LEFT HIP (WITH GAMMA NAIL);  Surgeon: Carole Civil, MD;  Location: [facility] ORS;  Service: Orthopedics;  Laterality: Left;  . NASAL SINUS SURGERY    . PACEMAKER INSERTION    . TEE WITHOUT CARDIOVERSION N/A 10/25/2015   Procedure: TRANSESOPHAGEAL ECHOCARDIOGRAM (TEE);  Surgeon: Burnell Blanks, MD;  Location: [place];  Service: Open Heart Surgery;  Laterality: N/A;  . TRANSCATHETER AORTIC VALVE  REPLACEMENT, TRANSFEMORAL Right 10/25/2015   Procedure: TRANSCATHETER AORTIC VALVE REPLACEMENT, TRANSFEMORAL;  Surgeon: Burnell Blanks, MD;  Location: [place];  Service: Open Heart Surgery;  Laterality: Right;    Social History   Socioeconomic History  . Marital status: Widowed    Spouse name: Not on file  . Number of children: 3  . Years of education: Not on file  . Highest education level: 12th grade  Occupational History  . Occupation: Retired  Tobacco Use  . Smoking status: Never  . Smokeless tobacco: Never  Vaping Use  . Vaping Use: Never used  Substance and Sexual Activity  . Alcohol use: No    Alcohol/week: 0.0 standard drinks  . Drug use: No  . Sexual activity: Not on file  Other Topics Concern  . Not on file  Social History Narrative  . Not on file   Social Determinants of Health   Financial Resource Strain: Not on file  Food Insecurity: Not on file  Transportation Needs: Not on file  Physical Activity: Not on file  Stress: Not on file  Social Connections: Not on file  Intimate Partner Violence: Not on file   Family History  Problem Relation Age of Onset  . Hypertension Mother   . Pneumonia Father   . Stomach cancer Brother   . Stroke Brother   . AAA (abdominal aortic aneurysm) Brother   . Cervical cancer Daughter   . Heart attack Neg Hx   . Colon cancer Neg Hx   . Esophageal cancer Neg Hx       VITAL SIGNS BP 135/89   Pulse 72   Temp 97.8 F (36.6 C)   Resp 18   Ht 5\' 1"  (1.549 m)   Wt 93 lb 12.8 oz (42.5 kg)   BMI 17.72 kg/m   Outpatient Encounter Medications as of 12/22/2020  Medication Sig  . acetaminophen (TYLENOL) 325 MG tablet Take 2 tablets (650 mg total) by mouth every 4 (four) hours as needed for mild pain (or temp > 37.5 C (99.5 F)).  [name] albuterol (PROAIR HFA) 108 (90 Base) MCG/ACT inhaler Inhale 2 puffs into the lungs every 4 (four) hours as needed for wheezing or shortness of breath.  . Amino Acids-Protein Hydrolys (FEEDING  SUPPLEMENT, PRO-STAT SUGAR FREE 64,) LIQD Take 30 mLs by mouth 3 (three) times daily with meals. for albumin of 2.2  . Balsam [place]-Castor Oil OINT Apply 1 application topically daily. Apply to sacrum and bilateral buttocks every shift  . Biotin 10 MG CAPS Take 10 mg by mouth daily.   . Budeson-Glycopyrrol-Formoterol (BREZTRI AEROSPHERE) 160-9-4.8 MCG/ACT AERO Inhale 2 Inhalers into the lungs in the morning and at bedtime.  . busPIRone (BUSPAR) 5 MG tablet Take 5 mg by mouth 3 (three) times daily.  . Calcium Carb-Cholecalciferol (CALTRATE 600+D3) 600-800 MG-UNIT TABS Take 1 tablet by mouth daily.  [name] docusate sodium (COLACE) 100 MG capsule Take 1 capsule (100 mg total) by mouth 2 (two) times daily.  [name]  famotidine (PEPCID) 20 MG tablet Take 1 tablet (20 mg total) by mouth at bedtime.  [name] Fe Bisgly-Fe Polysac-Vit C (NIFEREX-150 PO) Take by mouth. Take 150 mg iron- 60 mg-1 mg; oral,Once A Day; 09:00 AM  . feeding supplement (ENSURE ENLIVE / ENSURE PLUS) LIQD Take by mouth once.  . folic acid (FOLVITE) 1 MG tablet Take 1 tablet (1 mg total) by mouth daily.  . furosemide (LASIX) 40 MG tablet Take 1 tablet (40 mg total) by mouth daily.  [name] levalbuterol (XOPENEX) 1.25 MG/3ML nebulizer solution Take 1.25 mg by nebulization every 4 (four) hours as needed for wheezing. Dx J45.909  . levothyroxine (SYNTHROID) 25 MCG tablet Take 25 mcg by mouth daily before breakfast.  . loratadine (CLARITIN) 10 MG tablet Take 10 mg by mouth daily.  . memantine (NAMENDA) 10 MG tablet Take 1 tablet (10 mg total) by mouth 2 (two) times daily.  . metoprolol tartrate (LOPRESSOR) 25 MG tablet Take 12.5 mg by mouth 2 (two) times daily. HOLD FOR SYSTOLIC B/P <= 885 [DX: Paroxysmal atrial fibrillation]  . NON FORMULARY Regular diet  . omeprazole (PRILOSEC) 40 MG capsule Take 40 mg by mouth in the morning and at bedtime.  . potassium chloride SA (KLOR-CON) 20 MEQ tablet Take 20 mEq by mouth daily.   No facility-administered encounter  medications on file as of 12/22/2020.     SIGNIFICANT DIAGNOSTIC EXAMS       ASSESSMENT/ PLAN:     Ok Edwards NP [facility]  Contact [phone] Monday through Friday 8am- 5pm  After hours call [phone]   This encounter was created in error - please disregard.

## 2020-12-22 NOTE — Progress Notes (Signed)
Location:  [place] Room Number: 142-D Place of Service:  SNF (31)   CODE STATUS: DNR  Allergies  Allergen Reactions  . Hctz [Hydrochlorothiazide] Other (See Comments)    Pt was ill and this affected her kidneys   . Aspirin Other (See Comments)    Cardiologist said the patient is to not take this  . Codeine Other (See Comments)    Made the patient feel ill, has not had any problems since 1977    Chief Complaint  Patient presents with  . Acute Visit    Care plan meeting.    HPI:  We have come together for her care plan meeting to discuss goals of care. Family present. BIMS 10/15 mood 3/30: decreased interest in activities; decreased appetite; short tempered. She is physically and verbally aggressive at time. She is non-ambulatory. She requires extensive to dependent for her adl needs. She requires assist with eating. She is incontinent of bladder and bowel. She is frequently declining medication; treatments and oral care. She remains on iron transfusions. She is losing weight from 108 to her current weight of 93.8 pounds. We have discussed her medications with family: they have decided to keep her iron will stop vitamins; will lower her lasix to 30 mg daily. We have discussed her advanced directives: they wish to continue her DNR status; no feeding tube and limited hospitalization. They do desire for palliative care.   Past Medical History:  Diagnosis Date  . AKI (acute kidney injury) ([place])   . Anemia   . Aortic stenosis    Status post TAVR 2017  . Arthritis   . Asthma   . Atrial fibrillation ([place])   . AVM (arteriovenous malformation) of small bowel, acquired   . CKD (chronic kidney disease) stage 3, GFR 30-59 ml/min (HCC)   . Colon cancer ([place])   . Dementia ([place])   . Essential hypertension   . GERD (gastroesophageal reflux disease)   . GI bleed   . Glaucoma   . Hearing loss   . History of pneumonia   . Pacemaker    Medtronic  . Recurrent UTI    . Stroke ([place])   . SVT (supraventricular tachycardia) (HCC)    S/P ablation of AVNRT in 2003    Past Surgical History:  Procedure Laterality Date  . APPENDECTOMY    . BIOPSY  04/07/2018   Procedure: BIOPSY;  Surgeon: Jerene Bears, MD;  Location: [facility] ENDOSCOPY;  Service: Gastroenterology;;  . BIOPSY  02/19/2020   Procedure: BIOPSY;  Surgeon: Harvel Quale, MD;  [facility];  Service: Gastroenterology;;  gastric   . BLADDER SURGERY    . CARDIAC CATHETERIZATION N/A 09/26/2015   Procedure: Right/Left Heart Cath and Coronary Angiography;  Surgeon: Burnell Blanks, MD;  Location: [place] CV LAB;  Service: Cardiovascular;  Laterality: N/A;  . CARDIAC SURGERY    . CARDIOVERSION N/A 03/02/2016   Procedure: CARDIOVERSION;  Surgeon: Thayer Headings, MD;  Location: [facility] ENDOSCOPY;  Service: Cardiovascular;  Laterality: N/A;  . ENTEROSCOPY  02/19/2020   Procedure: ENTEROSCOPY;  Surgeon: Harvel Quale, MD;  [facility] ENDO SUITE;  Service: Gastroenterology;;  . EP IMPLANTABLE DEVICE N/A 10/26/2015   Procedure: Pacemaker Implant;  Surgeon: Will Meredith Leeds, MD;  Location: [place] CV LAB;  Service: Cardiovascular;  Laterality: N/A;  . ESOPHAGOGASTRODUODENOSCOPY (EGD) WITH PROPOFOL N/A 04/07/2018   Procedure: ESOPHAGOGASTRODUODENOSCOPY (EGD) WITH PROPOFOL;  Surgeon: Jerene Bears, MD;  Location: [place] ENDOSCOPY;  Service: Gastroenterology;  Laterality: N/A;  . ESOPHAGOGASTRODUODENOSCOPY (EGD) WITH PROPOFOL N/A 10/12/2019   Procedure: ESOPHAGOGASTRODUODENOSCOPY (EGD) WITH PROPOFOL;  Surgeon: Danie Binder, MD;  [facility] ENDO [facility];  Service: Endoscopy;  Laterality: N/A;  . EYE SURGERY Bilateral    cataract surgery  . FLEXIBLE SIGMOIDOSCOPY  02/19/2020   Procedure: FLEXIBLE SIGMOIDOSCOPY;  Surgeon: Harvel Quale, MD;  [facility];  Service: Gastroenterology;;  . HIP FRACTURE SURGERY Left 02/2020  . HOT HEMOSTASIS N/A 04/07/2018    Procedure: HOT HEMOSTASIS (ARGON PLASMA COAGULATION/BICAP);  Surgeon: Jerene Bears, MD;  Location: [facility] ENDOSCOPY;  Service: Gastroenterology;  Laterality: N/A;  . HOT HEMOSTASIS  10/12/2019   Procedure: HOT HEMOSTASIS (ARGON PLASMA COAGULATION/BICAP);  Surgeon: Danie Binder, MD;  [facility];  Service: Endoscopy;;  . INTRAMEDULLARY (IM) NAIL INTERTROCHANTERIC Left 03/10/2020   Procedure: OPEN TREATMENT INTERNAL FIXATION LEFT HIP (WITH GAMMA NAIL);  Surgeon: Carole Civil, MD;  Location: [facility] ORS;  Service: Orthopedics;  Laterality: Left;  . NASAL SINUS SURGERY    . PACEMAKER INSERTION    . TEE WITHOUT CARDIOVERSION N/A 10/25/2015   Procedure: TRANSESOPHAGEAL ECHOCARDIOGRAM (TEE);  Surgeon: Burnell Blanks, MD;  Location: [place];  Service: Open Heart Surgery;  Laterality: N/A;  . TRANSCATHETER AORTIC VALVE REPLACEMENT, TRANSFEMORAL Right 10/25/2015   Procedure: TRANSCATHETER AORTIC VALVE REPLACEMENT, TRANSFEMORAL;  Surgeon: Burnell Blanks, MD;  Location: [place];  Service: Open Heart Surgery;  Laterality: Right;    Social History   Socioeconomic History  . Marital status: Widowed    Spouse name: Not on file  . Number of children: 3  . Years of education: Not on file  . Highest education level: 12th grade  Occupational History  . Occupation: Retired  Tobacco Use  . Smoking status: Never  . Smokeless tobacco: Never  Vaping Use  . Vaping Use: Never used  Substance and Sexual Activity  . Alcohol use: No    Alcohol/week: 0.0 standard drinks  . Drug use: No  . Sexual activity: Not on file  Other Topics Concern  . Not on file  Social History Narrative  . Not on file   Social Determinants of Health   Financial Resource Strain: Not on file  Food Insecurity: Not on file  Transportation Needs: Not on file  Physical Activity: Not on file  Stress: Not on file  Social Connections: Not on file  Intimate Partner Violence: Not on file   Family History  Problem  Relation Age of Onset  . Hypertension Mother   . Pneumonia Father   . Stomach cancer Brother   . Stroke Brother   . AAA (abdominal aortic aneurysm) Brother   . Cervical cancer Daughter   . Heart attack Neg Hx   . Colon cancer Neg Hx   . Esophageal cancer Neg Hx       VITAL SIGNS BP 135/89   Pulse 72   Temp 97.8 F (36.6 C)   Resp (!) 22   Ht _0  (1.549 m)   Wt 93 lb 12.8 oz (42.5 kg)   SpO2 93%   BMI 17.72 kg/m   Outpatient Encounter Medications as of 12/22/2020  Medication Sig  . acetaminophen (TYLENOL) 325 MG tablet Take 2 tablets (650 mg total) by mouth every 4 (four) hours as needed for mild pain (or temp > 37.5 C (99.5 F)).  [name] albuterol (PROAIR HFA) 108 (90 Base) MCG/ACT inhaler Inhale 2 puffs into the lungs every 4 (four)  hours as needed for wheezing or shortness of breath.  . Budeson-Glycopyrrol-Formoterol (BREZTRI AEROSPHERE) 160-9-4.8 MCG/ACT AERO Inhale 2 Inhalers into the lungs in the morning and at bedtime.  . busPIRone (BUSPAR) 5 MG tablet Take 5 mg by mouth 3 (three) times daily.  . cefTRIAXone (ROCEPHIN) 500 MG injection Inject 500 mg into the muscle once. For IM use in large muscle mass  . docusate sodium (COLACE) 100 MG capsule Take 1 capsule (100 mg total) by mouth 2 (two) times daily.  [name] Fe Bisgly-Fe Polysac-Vit C (NIFEREX-150 PO) Take by mouth. Take 150 mg iron- 60 mg-1 mg; oral,Once A Day; 09:00 AM  . feeding supplement (ENSURE ENLIVE / ENSURE PLUS) LIQD Take by mouth once.  . folic acid (FOLVITE) 1 MG tablet Take 1 tablet (1 mg total) by mouth daily.  [name] levalbuterol (XOPENEX) 1.25 MG/3ML nebulizer solution Take 1.25 mg by nebulization every 4 (four) hours as needed for wheezing. Dx J45.909  . levothyroxine (SYNTHROID) 25 MCG tablet Take 25 mcg by mouth daily before breakfast.  . loratadine (CLARITIN) 10 MG tablet Take 10 mg by mouth daily.  . memantine (NAMENDA) 10 MG tablet Take 1 tablet (10 mg total) by mouth 2 (two) times daily.  . metoprolol  tartrate (LOPRESSOR) 25 MG tablet Take 12.5 mg by mouth 2 (two) times daily. HOLD FOR SYSTOLIC B/P <= 163 [DX: Paroxysmal atrial fibrillation]  . NON FORMULARY Regular diet  . omeprazole (PRILOSEC) 40 MG capsule Take 40 mg by mouth in the morning and at bedtime.  . potassium chloride SA (KLOR-CON) 20 MEQ tablet Take 20 mEq by mouth daily.  Roseanne Kaufman [place]-Castor Oil OINT Apply 1 application topically daily. Apply to sacrum and bilateral buttocks every shift  . famotidine (PEPCID) 20 MG tablet Take 1 tablet (20 mg total) by mouth at bedtime.  . [DISCONTINUED] Amino Acids-Protein Hydrolys (FEEDING SUPPLEMENT, PRO-STAT SUGAR FREE 64,) LIQD Take 30 mLs by mouth 3 (three) times daily with meals. for albumin of 2.2  . [DISCONTINUED] Biotin 10 MG CAPS Take 10 mg by mouth daily.   . [DISCONTINUED] Calcium Carb-Cholecalciferol (CALTRATE 600+D3) 600-800 MG-UNIT TABS Take 1 tablet by mouth daily.  . [DISCONTINUED] furosemide (LASIX) 40 MG tablet Take 1 tablet (40 mg total) by mouth daily.   No facility-administered encounter medications on file as of 12/22/2020.     SIGNIFICANT DIAGNOSTIC EXAMS   PREVIOUS   08-13-20: ct head:  No acute abnormality. Atrophy and chronic microvascular ischemic disease.  08-13-20: ct angio of head and neck:  1. 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 2. 26 mL ischemic penumbra in the left MCA territory without evidence of a core infarct. 3. Mild-to-moderate cervical carotid atherosclerosis without significant stenosis. 4. Aortic Atherosclerosis   08-14-20: carotid doppler: Color duplex indicates minimal heterogeneous and calcified plaque, with no hemodynamically significant stenosis by duplex criteria in the extracranial cerebrovascular circulation.  08-17-20: ct of chest; abdomen and pelvis Chest Impression: 1. Scattered peribronchial thickening in LEFT and RIGHT lung suggest mild pulmonary inflammation or infection. 2. Focus of consolidation with pleural  fluid at the RIGHT lung base differential include pneumonia or round atelectasis.   Abdomen / Pelvis Impression: 1. Circumferential submucosal thickening over 6 cm segment of the ascending colon. 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 Recommend endoscopy for further evaluation. 2. No evidence of metastatic adenopathy other than a small retrocrural lymph node which is favored unrelated 3. No liver metastasis. 4. Multiple diverticula bladder.  NO NEW EXAMS.   LABS REVIEWED PREVIOUS  08-13-20; wbc 7.5; hgb 9.4; hct 31.8; mcv 95.8 plt 270; glucose 122; bun 24; creat 1.28; k+ 3.9; na++ 135; ca 8.7 liver normal albumin 2.9 08-14-20; hgb a1c 4.7; chol 134; ldl 66; trig 96; hdl 49 08-17-20: wbc 7.7; hgb 9.4; hct 30.4; mcv 94.4 plt 242; glucose 111; bun 17; creat 1.16; k+ 3.7; na++ 136; ca 8.9 liver normal albumin 2.7  09-05-20: wbc 8.0; hgb 9.8; hct 31.5; mcv 94.6 plt 245; glucose 123; bun 22; creat 1.55; k+ 4.1; na++ 131; ca 8.5 GFR 21 liver normal albumin 2.9; iron 26; ferritin 222; vit B 12: >7500 09-17-20: tsh 1.637  11-09-20: wbc 4.4; hgb 8.7; hct 29.4; mcv 97.4 plt 180; glucose 99; bun 24; creat 1.16; k+ 4.1; na++ 134; ca 7.8; GFR 45; alk phos 136; albumin 2.2; CEA 8.1; iron 30; tibc 187; ferritin 120   NO NEW LABS.   Review of Systems  Unable to perform ROS: Dementia (unable to participate)   Physical Exam Constitutional:      General: She is not in acute distress.    Appearance: She is cachectic. She is not diaphoretic.  Neck:     Thyroid: No thyromegaly.  Cardiovascular:     Rate and Rhythm: Normal rate and regular rhythm.     Pulses: Normal pulses.     Heart sounds: Normal heart sounds.  Pulmonary:     Effort: Pulmonary effort is normal. No respiratory distress.     Breath sounds: Normal breath sounds.  Abdominal:     General: Bowel sounds are normal. There is no distension.     Palpations: Abdomen is soft.     Tenderness: There is no abdominal tenderness.   Musculoskeletal:        General: Normal range of motion.     Cervical back: Neck supple.     Right lower leg: No edema.     Left lower leg: No edema.  Lymphadenopathy:     Cervical: No cervical adenopathy.  Skin:    General: Skin is warm and dry.  Neurological:     Mental Status: She is alert. Mental status is at baseline.  Psychiatric:        Mood and Affect: Mood normal.     ASSESSMENT/ PLAN:  TODAY  Aortic atherosclerosis Cerebrovascular accident (CVA) unspecified mechanism  Malignant neoplasm of colon unspecified part of colon  Alzheimer dementia without behavioral disturbance unspecified age of onset  Will stop vitamins and will lower lasix to 30 mg daily will keep iron Will continue current plan of care:  MOST: DNR: no feeding tube; limited hospitalization;   Time spent with patient and family: 50 minutes (30 minutes with advanced directives)     Ok Edwards NP [facility] Adult Medicine  Contact [phone] Monday through Friday 8am- 5pm  After hours call [phone]

## 2020-12-27 DIAGNOSIS — I69398 Other sequelae of cerebral infarction: Secondary | ICD-10-CM | POA: Diagnosis not present

## 2020-12-27 DIAGNOSIS — Z1159 Encounter for screening for other viral diseases: Secondary | ICD-10-CM | POA: Diagnosis not present

## 2020-12-27 NOTE — Progress Notes (Signed)
Remote pacemaker transmission.   

## 2020-12-28 DIAGNOSIS — I69398 Other sequelae of cerebral infarction: Secondary | ICD-10-CM | POA: Diagnosis not present

## 2020-12-28 DIAGNOSIS — R627 Adult failure to thrive: Secondary | ICD-10-CM | POA: Insufficient documentation

## 2020-12-28 DIAGNOSIS — Z1159 Encounter for screening for other viral diseases: Secondary | ICD-10-CM | POA: Diagnosis not present

## 2020-12-28 DIAGNOSIS — L03114 Cellulitis of left upper limb: Secondary | ICD-10-CM | POA: Insufficient documentation

## 2020-12-29 ENCOUNTER — Other Ambulatory Visit (HOSPITAL_COMMUNITY)
Admission: RE | Admit: 2020-12-29 | Discharge: 2020-12-29 | Disposition: A | Discharge status: Home / Self Care | Payer: Medicare Other | Source: Skilled Nursing Facility | Attending: Adult Health | Admitting: Adult Health

## 2020-12-29 DIAGNOSIS — I5032 Chronic diastolic (congestive) heart failure: Secondary | ICD-10-CM | POA: Diagnosis not present

## 2020-12-29 LAB — BASIC METABOLIC PANEL
Anion gap: 7 (ref 5–15)
BUN: 21 mg/dL (ref 8–23)
CO2: 25 mmol/L (ref 22–32)
Calcium: 8.1 mg/dL — ABNORMAL LOW (ref 8.9–10.3)
Chloride: 106 mmol/L (ref 98–111)
Creatinine, Ser: 0.88 mg/dL (ref 0.44–1.00)
GFR, Estimated: >60 mL/min (ref >60)
Glucose, Bld: 83 mg/dL (ref 70–99)
Potassium: 4.5 mmol/L (ref 3.5–5.1)
Sodium: 138 mmol/L (ref 135–145)

## 2020-12-30 ENCOUNTER — Non-Acute Institutional Stay (SKILLED_NURSING_FACILITY): Payer: Medicare Other | Admitting: Adult Health

## 2020-12-30 ENCOUNTER — Other Ambulatory Visit: Payer: Self-pay | Admitting: Adult Health

## 2020-12-30 ENCOUNTER — Encounter: Payer: Self-pay | Admitting: Adult Health

## 2020-12-30 DIAGNOSIS — I480 Paroxysmal atrial fibrillation: Secondary | ICD-10-CM

## 2020-12-30 MED ORDER — MORPHINE SULFATE (CONCENTRATE) 20 MG/ML PO SOLN: 5.0000 mg | ORAL | 0 refills | Status: AC | PRN

## 2020-12-30 NOTE — Progress Notes (Signed)
Location:  [place] Room Number: 142-D Place of Service:  SNF (31)   CODE STATUS: DNR  Allergies  Allergen Reactions  . Hctz [Hydrochlorothiazide] Other (See Comments)    Pt was ill and this affected her kidneys   . Aspirin Other (See Comments)    Cardiologist said the patient is to not take this  . Codeine Other (See Comments)    Made the patient feel ill, has not had any problems since 1977    Chief Complaint  Patient presents with  . Acute Visit    Change in status    HPI:  Her family is concerned about her status. She is having tachycardia and shortness of breath present. She does not open her eyes. When asked is she feels good she did nod no. Her skin is warm and dry. Her po intake today is poor. There are no reports of fevers present.   Past Medical History:  Diagnosis Date  . AKI (acute kidney injury) ([place])   . Anemia   . Aortic stenosis    Status post TAVR 2017  . Arthritis   . Asthma   . Atrial fibrillation ([place])   . AVM (arteriovenous malformation) of small bowel, acquired   . CKD (chronic kidney disease) stage 3, GFR 30-59 ml/min (HCC)   . Colon cancer ([place])   . Dementia ([place])   . Essential hypertension   . GERD (gastroesophageal reflux disease)   . GI bleed   . Glaucoma   . Hearing loss   . History of pneumonia   . Pacemaker    Medtronic  . Recurrent UTI   . Stroke ([place])   . SVT (supraventricular tachycardia) (HCC)    S/P ablation of AVNRT in 2003    Past Surgical History:  Procedure Laterality Date  . APPENDECTOMY    . BIOPSY  04/07/2018   Procedure: BIOPSY;  Surgeon: Jerene Bears, MD;  Location: [facility] ENDOSCOPY;  Service: Gastroenterology;;  . BIOPSY  02/19/2020   Procedure: BIOPSY;  Surgeon: Harvel Quale, MD;  [facility];  Service: Gastroenterology;;  gastric   . BLADDER SURGERY    . CARDIAC CATHETERIZATION N/A 09/26/2015   Procedure: Right/Left Heart Cath and Coronary Angiography;  Surgeon:  Burnell Blanks, MD;  Location: [place] CV LAB;  Service: Cardiovascular;  Laterality: N/A;  . CARDIAC SURGERY    . CARDIOVERSION N/A 03/02/2016   Procedure: CARDIOVERSION;  Surgeon: Thayer Headings, MD;  Location: [facility] ENDOSCOPY;  Service: Cardiovascular;  Laterality: N/A;  . ENTEROSCOPY  02/19/2020   Procedure: ENTEROSCOPY;  Surgeon: Harvel Quale, MD;  [facility];  Service: Gastroenterology;;  . EP IMPLANTABLE DEVICE N/A 10/26/2015   Procedure: Pacemaker Implant;  Surgeon: Will Meredith Leeds, MD;  Location: [place] CV LAB;  Service: Cardiovascular;  Laterality: N/A;  . ESOPHAGOGASTRODUODENOSCOPY (EGD) WITH PROPOFOL N/A 04/07/2018   Procedure: ESOPHAGOGASTRODUODENOSCOPY (EGD) WITH PROPOFOL;  Surgeon: Jerene Bears, MD;  Location: [facility] ENDOSCOPY;  Service: Gastroenterology;  Laterality: N/A;  . ESOPHAGOGASTRODUODENOSCOPY (EGD) WITH PROPOFOL N/A 10/12/2019   Procedure: ESOPHAGOGASTRODUODENOSCOPY (EGD) WITH PROPOFOL;  Surgeon: Danie Binder, MD;  [facility];  Service: Endoscopy;  Laterality: N/A;  . EYE SURGERY Bilateral    cataract surgery  . FLEXIBLE SIGMOIDOSCOPY  02/19/2020   Procedure: FLEXIBLE SIGMOIDOSCOPY;  Surgeon: Harvel Quale, MD;  [facility];  Service: Gastroenterology;;  . HIP FRACTURE SURGERY Left 02/2020  . HOT HEMOSTASIS N/A 04/07/2018  Procedure: HOT HEMOSTASIS (ARGON PLASMA COAGULATION/BICAP);  Surgeon: Jerene Bears, MD;  Location: [facility] ENDOSCOPY;  Service: Gastroenterology;  Laterality: N/A;  . HOT HEMOSTASIS  10/12/2019   Procedure: HOT HEMOSTASIS (ARGON PLASMA COAGULATION/BICAP);  Surgeon: Danie Binder, MD;  [facility];  Service: Endoscopy;;  . INTRAMEDULLARY (IM) NAIL INTERTROCHANTERIC Left 03/10/2020   Procedure: OPEN TREATMENT INTERNAL FIXATION LEFT HIP (WITH GAMMA NAIL);  Surgeon: Carole Civil, MD;  Location: [facility] ORS;  Service: Orthopedics;  Laterality: Left;  . NASAL SINUS  SURGERY    . PACEMAKER INSERTION    . TEE WITHOUT CARDIOVERSION N/A 10/25/2015   Procedure: TRANSESOPHAGEAL ECHOCARDIOGRAM (TEE);  Surgeon: Burnell Blanks, MD;  Location: [place];  Service: Open Heart Surgery;  Laterality: N/A;  . TRANSCATHETER AORTIC VALVE REPLACEMENT, TRANSFEMORAL Right 10/25/2015   Procedure: TRANSCATHETER AORTIC VALVE REPLACEMENT, TRANSFEMORAL;  Surgeon: Burnell Blanks, MD;  Location: [place];  Service: Open Heart Surgery;  Laterality: Right;    Social History   Socioeconomic History  . Marital status: Widowed    Spouse name: Not on file  . Number of children: 3  . Years of education: Not on file  . Highest education level: 12th grade  Occupational History  . Occupation: Retired  Tobacco Use  . Smoking status: Never  . Smokeless tobacco: Never  Vaping Use  . Vaping Use: Never used  Substance and Sexual Activity  . Alcohol use: No    Alcohol/week: 0.0 standard drinks  . Drug use: No  . Sexual activity: Not on file  Other Topics Concern  . Not on file  Social History Narrative  . Not on file   Social Determinants of Health   Financial Resource Strain: Not on file  Food Insecurity: Not on file  Transportation Needs: Not on file  Physical Activity: Not on file  Stress: Not on file  Social Connections: Not on file  Intimate Partner Violence: Not on file   Family History  Problem Relation Age of Onset  . Hypertension Mother   . Pneumonia Father   . Stomach cancer Brother   . Stroke Brother   . AAA (abdominal aortic aneurysm) Brother   . Cervical cancer Daughter   . Heart attack Neg Hx   . Colon cancer Neg Hx   . Esophageal cancer Neg Hx       VITAL SIGNS BP 128/76   Pulse 67   Temp 97.9 F (36.6 C)   Resp (!) 22   Wt 99 lb 3.2 oz (45 kg)   SpO2 93%   BMI 18.74 kg/m   Outpatient Encounter Medications as of 12/30/2020  Medication Sig  . acetaminophen (TYLENOL) 325 MG tablet Take 2 tablets (650 mg total) by mouth every 4 (four)  hours as needed for mild pain (or temp > 37.5 C (99.5 F)).  [name] albuterol (PROAIR HFA) 108 (90 Base) MCG/ACT inhaler Inhale 2 puffs into the lungs every 4 (four) hours as needed for wheezing or shortness of breath.  Roseanne Kaufman [place]-Castor Oil OINT Apply 1 application topically daily. Apply to sacrum and bilateral buttocks every shift  . Budeson-Glycopyrrol-Formoterol (BREZTRI AEROSPHERE) 160-9-4.8 MCG/ACT AERO Inhale 2 Inhalers into the lungs in the morning and at bedtime.  . busPIRone (BUSPAR) 5 MG tablet Take 5 mg by mouth 3 (three) times daily.  . cefTRIAXone (ROCEPHIN) 500 MG injection Inject 500 mg into the muscle once. For IM use in large muscle mass  . docusate sodium (COLACE) 100 MG capsule Take  1 capsule (100 mg total) by mouth 2 (two) times daily.  [name] Fe Bisgly-Fe Polysac-Vit C (NIFEREX-150 PO) Take by mouth. Take 150 mg iron- 60 mg-1 mg; oral,Once A Day; 09:00 AM  . feeding supplement (ENSURE ENLIVE / ENSURE PLUS) LIQD Take by mouth once.  . folic acid (FOLVITE) 1 MG tablet Take 1 tablet (1 mg total) by mouth daily.  [name] levalbuterol (XOPENEX) 1.25 MG/3ML nebulizer solution Take 1.25 mg by nebulization every 4 (four) hours as needed for wheezing. Dx J45.909  . levothyroxine (SYNTHROID) 25 MCG tablet Take 25 mcg by mouth daily before breakfast.  . loratadine (CLARITIN) 10 MG tablet Take 10 mg by mouth daily.  . memantine (NAMENDA) 10 MG tablet Take 1 tablet (10 mg total) by mouth 2 (two) times daily.  . metoprolol tartrate (LOPRESSOR) 25 MG tablet Take 12.5 mg by mouth 2 (two) times daily. HOLD FOR SYSTOLIC B/P <= 007 [DX: Paroxysmal atrial fibrillation]  . morphine (ROXANOL) 20 MG/ML concentrated solution Take 0.25 mLs (5 mg total) by mouth every 4 (four) hours as needed for severe pain.  . NON FORMULARY Regular diet  . omeprazole (PRILOSEC) 40 MG capsule Take 40 mg by mouth in the morning and at bedtime.  . potassium chloride SA (KLOR-CON) 20 MEQ tablet Take 20 mEq by mouth daily.  .  famotidine (PEPCID) 20 MG tablet Take 1 tablet (20 mg total) by mouth at bedtime.   No facility-administered encounter medications on file as of 12/30/2020.     SIGNIFICANT DIAGNOSTIC EXAMS   PREVIOUS   08-13-20: ct head:  No acute abnormality. Atrophy and chronic microvascular ischemic disease.  08-13-20: ct angio of head and neck:  1. 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 2. 26 mL ischemic penumbra in the left MCA territory without evidence of a core infarct. 3. Mild-to-moderate cervical carotid atherosclerosis without significant stenosis. 4. Aortic Atherosclerosis   08-14-20: carotid doppler: Color duplex indicates minimal heterogeneous and calcified plaque, with no hemodynamically significant stenosis by duplex criteria in the extracranial cerebrovascular circulation.  08-17-20: ct of chest; abdomen and pelvis Chest Impression: 1. Scattered peribronchial thickening in LEFT and RIGHT lung suggest mild pulmonary inflammation or infection. 2. Focus of consolidation with pleural fluid at the RIGHT lung base differential include pneumonia or round atelectasis.   Abdomen / Pelvis Impression: 1. Circumferential submucosal thickening over 6 cm segment of the ascending colon. 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 Recommend endoscopy for further evaluation. 2. No evidence of metastatic adenopathy other than a small retrocrural lymph node which is favored unrelated 3. No liver metastasis. 4. Multiple diverticula bladder.  NO NEW EXAMS.   LABS REVIEWED PREVIOUS   08-13-20; wbc 7.5; hgb 9.4; hct 31.8; mcv 95.8 plt 270; glucose 122; bun 24; creat 1.28; k+ 3.9; na++ 135; ca 8.7 liver normal albumin 2.9 08-14-20; hgb a1c 4.7; chol 134; ldl 66; trig 96; hdl 49 08-17-20: wbc 7.7; hgb 9.4; hct 30.4; mcv 94.4 plt 242; glucose 111; bun 17; creat 1.16; k+ 3.7; na++ 136; ca 8.9 liver normal albumin 2.7  09-05-20: wbc 8.0; hgb 9.8; hct 31.5; mcv 94.6 plt 245; glucose 123; bun 22;  creat 1.55; k+ 4.1; na++ 131; ca 8.5 GFR 21 liver normal albumin 2.9; iron 26; ferritin 222; vit B 12: >7500 09-17-20: tsh 1.637  11-09-20: wbc 4.4; hgb 8.7; hct 29.4; mcv 97.4 plt 180; glucose 99; bun 24; creat 1.16; k+ 4.1; na++ 134; ca 7.8; GFR 45; alk phos 136; albumin 2.2; CEA 8.1; iron 30;  tibc 187; ferritin 120   NO NEW LABS.   Review of Systems  Unable to perform ROS: Dementia (unable to participate)   Physical Exam Constitutional:      General: She is not in acute distress.    Appearance: She is cachectic. She is not diaphoretic.  Neck:     Thyroid: No thyromegaly.  Cardiovascular:     Rate and Rhythm: Normal rate and regular rhythm.     Pulses: Normal pulses.     Heart sounds: Normal heart sounds.  Pulmonary:     Effort: Pulmonary effort is normal. No respiratory distress.     Breath sounds: Normal breath sounds.  Abdominal:     General: Bowel sounds are normal. There is no distension.     Palpations: Abdomen is soft.     Tenderness: There is no abdominal tenderness.  Musculoskeletal:        General: Normal range of motion.     Cervical back: Neck supple.     Right lower leg: No edema.     Left lower leg: No edema.  Lymphadenopathy:     Cervical: No cervical adenopathy.  Skin:    General: Skin is warm and dry.  Neurological:     Comments: Is aware       ASSESSMENT/ PLAN:  TODAY  PAF (paroxysmal atrial fibrillation)  she is worse will give her 12.5 mg lopressor now. Will continue 12.5 mg twice daily and will add 12.5 mg twice daily as needed for tachycardia; will monitor    Ok Edwards NP [facility] Adult Medicine  Contact [phone] Monday through Friday 8am- 5pm  After hours call [phone]

## 2021-01-03 DIAGNOSIS — I69398 Other sequelae of cerebral infarction: Secondary | ICD-10-CM | POA: Diagnosis not present

## 2021-01-03 DIAGNOSIS — Z1159 Encounter for screening for other viral diseases: Secondary | ICD-10-CM | POA: Diagnosis not present

## 2021-01-04 ENCOUNTER — Other Ambulatory Visit (HOSPITAL_COMMUNITY): Payer: Medicare Other

## 2021-01-10 ENCOUNTER — Encounter: Payer: Self-pay | Admitting: Adult Health

## 2021-01-10 ENCOUNTER — Non-Acute Institutional Stay (SKILLED_NURSING_FACILITY): Payer: Medicare Other | Admitting: Adult Health

## 2021-01-10 DIAGNOSIS — Z0000 Encounter for general adult medical examination without abnormal findings: Secondary | ICD-10-CM | POA: Diagnosis not present

## 2021-01-10 NOTE — Patient Instructions (Signed)
Crystal Cooper , Thank you for taking time to come for your Medicare Wellness Visit. I appreciate your ongoing commitment to your health goals. Please review the following plan we discussed and let me know if I can assist you in the future.   These are the goals we discussed:  Goals       Chronic Disease Management Needs      CARE PLAN ENTRY (see longtitudinal plan of care for additional care plan information)  Current Barriers:  Chronic Disease Management support, education, and care coordination needs related to Osteoporosis, HTN, CKD, CHF, Asthma, PAF, ASCVD, hearing loss, CVA  Clinical Goal(s) related to Osteoporosis, HTN, CKD, CHF, Asthma, PAF, ASCVD, hearing loss, CVA:  Over the next 45 days, patient will:  Work with the care management team to address educational, disease management, and care coordination needs  Begin or continue self health monitoring activities as directed today Measure and record weight daily Call provider office for new or worsened signs and symptoms Weight outside established parameters and Shortness of breath Call care management team with questions or concerns Verbalize basic understanding of patient centered plan of care established today  Interventions related to Osteoporosis, HTN, CKD, CHF, Asthma, PAF, ASCVD, hearing loss, CVA:  Evaluation of current treatment plans and patient's adherence to plan as established by provider Assessed patient understanding of disease states Assessed patient's education and care coordination needs Provided disease specific education to patient  Collaborated with appropriate clinical care team members regarding patient needs Provided with RN Care Manager telephone number and encouraged to reach out as needed  Patient Self Care Activities related to Osteoporosis, HTN, CKD, CHF, Asthma, PAF, ASCVD, hearing loss, CVA:  Patient is unable to independently self-manage chronic health conditions  Initial goal documentation        Daughter: "We want mom to improve and come home from SNF" (pt-stated)      CARE PLAN ENTRY (see longitudinal plan of care for additional care plan information)  Current Barriers:  Care Coordination needs related to mobility due to recent hip fracture and repair in a patient with osteoporosis, HTN, CKD, CHF, vascular dementia Cognitive Deficits  Nurse Case Manager Clinical Goal(s):  Over the next 4 weeks, patient will continue to work with physical therapist at SNF to improve strength and mobility Over the next 4 weeks, patient/family will talk with RN Care Manager about progress in the area of strength and mobility  Interventions:  Inter-disciplinary care team collaboration (see longitudinal plan of care) Chart reviewed including recent office notes and discharge notes Talked with daughter, Crystal Cooper, regarding Crystal Cooper's current condition Staying at [facility] for physical therapy and rehab Still needs maximum assistance for any activities involving lower extremity Working with PT daily Mon-Fri Using walker but can only walk about 15 feet Uses wheelchair at other times Discussed pain and daughter reports that Crystal Cooper denies having any surgical pain Discussed patient/family's desire for patient to improve to baseline and return home They want her her to continue to receive inpatient therapy until she is back to or very close to baseline Discussed family support 2 daughters and they alternate days that they visit Both have been encouraging patient to exercise upper and lower extremity and help her when they are there Daughters are very involved and talk with nursing staff and PT staff routinely  Previously provided [name] with RN Care Manager telephone number and encouraged to reach out as needed RN plans to follow-up in 4 weeks but Crystal Cooper should  call if she is discharged sooner than that or if there are any changes Advised that TOC appt with [facility] will be needed and that they  should receive a telephone call from [facility] staff to schedule this after SNF discharge  Patient Self Care Activities:  Unable to perform ADLs independently Unable to perform IADLs independently In SNF currently  Initial goal documentation       Exercise 3x per week (30 min per time)      Increase walking time by 15 minutes per day         This is a list of the screening recommended for you and due dates:  Health Maintenance  Topic Date Due   Zoster (Shingles) Vaccine (2 of 2) 04/02/2018   Flu Shot  01/23/2021   COVID-19 Vaccine (5 - Booster for Moderna series) 02/04/2021   Tetanus Vaccine  08/16/2026   DEXA scan (bone density measurement)  Completed   Pneumonia vaccines  Completed   HPV Vaccine  Aged Out

## 2021-01-10 NOTE — Progress Notes (Signed)
Subjective:   Crystal Cooper is a 87 y.o. female who presents for Medicare Annual (Subsequent) preventive examination.  Review of Systems    Review of Systems  Unable to perform ROS: Dementia (unable to participate)   Cardiac Risk Factors include: advanced age (>46men, >24 women);sedentary lifestyle     Objective:    Today's Vitals   01/10/21 1238  BP: 140/82  Pulse: 77  Resp: 16  Temp: 98 F (36.7 C)  SpO2: 98%  Weight: 99 lb 3.2 oz (45 kg)  Height: 5\' 1"  (1.549 m)   Body mass index is 18.74 kg/m.  Advanced Directives 12/30/2020 12/22/2020 12/01/2020 11/23/2020 11/22/2020 11/16/2020 11/11/2020  Does Patient Have a Medical Advance Directive? Yes Yes Yes Yes Yes Yes Yes  Type of Advance Directive Out of facility DNR (pink MOST or yellow form) Out of facility DNR (pink MOST or yellow form) Out of facility DNR (pink MOST or yellow form) Out of facility DNR (pink MOST or yellow form) Out of facility DNR (pink MOST or yellow form) Out of facility DNR (pink MOST or yellow form) Out of facility DNR (pink MOST or yellow form)  Does patient want to make changes to medical advance directive? No - Patient declined No - Patient declined No - Patient declined No - Patient declined No - Patient declined - No - Patient declined  Copy of [place] in Chart? - - - No - copy requested - No - copy requested -  Would patient like information on creating a medical advance directive? - - - No - Patient declined - No - Patient declined -  Pre-existing out of facility DNR order (yellow form or pink MOST form) Pink MOST/Yellow Form most recent copy in chart - Physician notified to receive inpatient order - - - Yellow form placed in chart (order not valid for inpatient use) - -    Current Medications (verified) Outpatient Encounter Medications as of 01/10/2021  Medication Sig   acetaminophen (TYLENOL) 325 MG tablet Take 2 tablets (650 mg total) by mouth every 4 (four) hours as needed for  mild pain (or temp > 37.5 C (99.5 F)).   albuterol (PROAIR HFA) 108 (90 Base) MCG/ACT inhaler Inhale 2 puffs into the lungs every 4 (four) hours as needed for wheezing or shortness of breath.   Balsam [place]-Castor Oil OINT Apply 1 application topically daily. Apply to sacrum and bilateral buttocks every shift   Budeson-Glycopyrrol-Formoterol (BREZTRI AEROSPHERE) 160-9-4.8 MCG/ACT AERO Inhale 2 Inhalers into the lungs in the morning and at bedtime.   busPIRone (BUSPAR) 5 MG tablet Take 5 mg by mouth 3 (three) times daily.   cefTRIAXone (ROCEPHIN) 500 MG injection Inject 500 mg into the muscle once. For IM use in large muscle mass   famotidine (PEPCID) 20 MG tablet Take 1 tablet (20 mg total) by mouth at bedtime.   Fe Bisgly-Fe Polysac-Vit C (NIFEREX-150 PO) Take by mouth. Take 150 mg iron- 60 mg-1 mg; oral,Once A Day; 09:00 AM   feeding supplement (ENSURE ENLIVE / ENSURE PLUS) LIQD Take by mouth once.   folic acid (FOLVITE) 1 MG tablet Take 1 tablet (1 mg total) by mouth daily.   levalbuterol (XOPENEX) 1.25 MG/3ML nebulizer solution Take 1.25 mg by nebulization every 4 (four) hours as needed for wheezing. Dx J45.909   levothyroxine (SYNTHROID) 25 MCG tablet Take 25 mcg by mouth daily before breakfast.   loratadine (CLARITIN) 10 MG tablet Take 10 mg by mouth daily.   memantine (  NAMENDA) 10 MG tablet Take 1 tablet (10 mg total) by mouth 2 (two) times daily.   metoprolol tartrate (LOPRESSOR) 25 MG tablet Take 12.5 mg by mouth 2 (two) times daily. HOLD FOR SYSTOLIC B/P <= 354 [DX: Paroxysmal atrial fibrillation]   morphine (ROXANOL) 20 MG/ML concentrated solution Take 0.25 mLs (5 mg total) by mouth every 4 (four) hours as needed for severe pain.   NON FORMULARY Regular diet   omeprazole (PRILOSEC) 40 MG capsule Take 40 mg by mouth in the morning and at bedtime.   potassium chloride SA (KLOR-CON) 20 MEQ tablet Take 20 mEq by mouth daily.   [DISCONTINUED] docusate sodium (COLACE) 100 MG capsule Take 1  capsule (100 mg total) by mouth 2 (two) times daily.   No facility-administered encounter medications on file as of 01/10/2021.    Allergies (verified) Hctz [hydrochlorothiazide], Aspirin, and Codeine   History: Past Medical History:  Diagnosis Date   AKI (acute kidney injury) ([place])    Anemia    Aortic stenosis    Status post TAVR 2017   Arthritis    Asthma    Atrial fibrillation (HCC)    AVM (arteriovenous malformation) of small bowel, acquired    CKD (chronic kidney disease) stage 3, GFR 30-59 ml/min (HCC)    Colon cancer (HCC)    Dementia (HCC)    Essential hypertension    GERD (gastroesophageal reflux disease)    GI bleed    Glaucoma    Hearing loss    History of pneumonia    Pacemaker    Medtronic   Recurrent UTI    Stroke [facility])    SVT (supraventricular tachycardia) (HCC)    S/P ablation of AVNRT in 2003   Past Surgical History:  Procedure Laterality Date   APPENDECTOMY     BIOPSY  04/07/2018   Procedure: BIOPSY;  Surgeon: Jerene Bears, MD;  Location: [place];  Service: Gastroenterology;;   BIOPSY  02/19/2020   Procedure: BIOPSY;  Surgeon: Harvel Quale, MD;  [facility];  Service: Gastroenterology;;  gastric    BLADDER SURGERY     CARDIAC CATHETERIZATION N/A 09/26/2015   Procedure: Right/Left Heart Cath and Coronary Angiography;  Surgeon: Burnell Blanks, MD;  Location: [place] CV LAB;  Service: Cardiovascular;  Laterality: N/A;   CARDIAC SURGERY     CARDIOVERSION N/A 03/02/2016   Procedure: CARDIOVERSION;  Surgeon: Thayer Headings, MD;  Location: [place];  Service: Cardiovascular;  Laterality: N/A;   ENTEROSCOPY  02/19/2020   Procedure: ENTEROSCOPY;  Surgeon: Harvel Quale, MD;  [facility];  Service: Gastroenterology;;   EP IMPLANTABLE DEVICE N/A 10/26/2015   Procedure: Pacemaker Implant;  Surgeon: Will Meredith Leeds, MD;  Location: [place] CV LAB;  Service: Cardiovascular;  Laterality: N/A;    ESOPHAGOGASTRODUODENOSCOPY (EGD) WITH PROPOFOL N/A 04/07/2018   Procedure: ESOPHAGOGASTRODUODENOSCOPY (EGD) WITH PROPOFOL;  Surgeon: Jerene Bears, MD;  Location: [facility] ENDOSCOPY;  Service: Gastroenterology;  Laterality: N/A;   ESOPHAGOGASTRODUODENOSCOPY (EGD) WITH PROPOFOL N/A 10/12/2019   Procedure: ESOPHAGOGASTRODUODENOSCOPY (EGD) WITH PROPOFOL;  Surgeon: Danie Binder, MD;  [facility];  Service: Endoscopy;  Laterality: N/A;   EYE SURGERY Bilateral    cataract surgery   FLEXIBLE SIGMOIDOSCOPY  02/19/2020   Procedure: FLEXIBLE SIGMOIDOSCOPY;  Surgeon: Harvel Quale, MD;  [facility];  Service: Gastroenterology;;   HIP FRACTURE SURGERY Left 02/2020   HOT HEMOSTASIS N/A 04/07/2018   Procedure: HOT HEMOSTASIS (ARGON PLASMA COAGULATION/BICAP);  Surgeon: Jerene Bears, MD;  [facility]COPY;  Service: Gastroenterology;  Laterality: N/A;   HOT HEMOSTASIS  10/12/2019   Procedure: HOT HEMOSTASIS (ARGON PLASMA COAGULATION/BICAP);  Surgeon: Danie Binder, MD;  [facility];  Service: Endoscopy;;   INTRAMEDULLARY (IM) NAIL INTERTROCHANTERIC Left 03/10/2020   Procedure: OPEN TREATMENT INTERNAL FIXATION LEFT HIP (WITH GAMMA NAIL);  Surgeon: Carole Civil, MD;  [facility];  Service: Orthopedics;  Laterality: Left;   NASAL SINUS SURGERY     PACEMAKER INSERTION     TEE WITHOUT CARDIOVERSION N/A 10/25/2015   Procedure: TRANSESOPHAGEAL ECHOCARDIOGRAM (TEE);  Surgeon: Burnell Blanks, MD;  Location: [place];  Service: Open Heart Surgery;  Laterality: N/A;   TRANSCATHETER AORTIC VALVE REPLACEMENT, TRANSFEMORAL Right 10/25/2015   Procedure: TRANSCATHETER AORTIC VALVE REPLACEMENT, TRANSFEMORAL;  Surgeon: Burnell Blanks, MD;  Location: [place];  Service: Open Heart Surgery;  Laterality: Right;   Family History  Problem Relation Age of Onset   Hypertension Mother    Pneumonia Father    Stomach cancer Brother    Stroke Brother    AAA  (abdominal aortic aneurysm) Brother    Cervical cancer Daughter    Heart attack Neg Hx    Colon cancer Neg Hx    Esophageal cancer Neg Hx    Social History   Socioeconomic History   Marital status: Widowed    Spouse name: Not on file   Number of children: 3   Years of education: Not on file   Highest education level: 12th grade  Occupational History   Occupation: Retired  Tobacco Use   Smoking status: Never   Smokeless tobacco: Never  Vaping Use   Vaping Use: Never used  Substance and Sexual Activity   Alcohol use: No    Alcohol/week: 0.0 standard drinks   Drug use: No   Sexual activity: Not Currently  Other Topics Concern   Not on file  Social History Narrative   Long term resident of [facility]    Social Determinants of Health   Financial Resource Strain: Not on file  Food Insecurity: Not on file  Transportation Needs: Not on file  Physical Activity: Not on file  Stress: Not on file  Social Connections: Not on file    Tobacco Counseling Counseling given: Not Answered   Clinical Intake:  Pre-visit preparation completed: Yes  Pain : Faces Faces Pain Scale: No hurt  Faces Pain Scale: No hurt  BMI - recorded: 18.74 Nutritional Status: BMI <19  Underweight Nutritional Risks: Unintentional weight loss Diabetes: No  How often do you need to have someone help you when you read instructions, pamphlets, or other written materials from your doctor or pharmacy?: 5 - [place] Needed?: No      Activities of Daily Living In your present state of health, do you have any difficulty performing the following activities: 01/10/2021 08/13/2020  Hearing? N Y  Vision? N N  Difficulty concentrating or making decisions? Tempie Donning  Walking or climbing stairs? Y Y  Dressing or bathing? Y Y  Doing errands, shopping? Tempie Donning  Preparing Food and eating ? Y -  Using the Toilet? Y -  In the past six months, have you accidently leaked urine? Y -  Do you have  problems with loss of bowel control? Y -  Managing your Medications? Y -  Managing your Finances? Y -  Housekeeping or managing your Housekeeping? Y -  Some recent data might be  hidden    Patient Care Team: Gerlene Fee, NP as PCP - General (Geriatric Medicine) Ander Slade Carlisle Beers, MD as Referring Physician (Ophthalmology) Constance Haw, MD as Consulting Physician (Cardiology) [facility] [place] [facility]  Indicate any recent [place] you may have received from other than [facility] providers in the past year (date may be approximate).     Assessment:   This is a routine wellness examination for [place].  Hearing/Vision screen No results found.  Dietary issues and exercise activities discussed: Current Exercise Habits: The patient does not participate in regular exercise at present   Goals Addressed               This Visit's Progress     Chronic Disease Management Needs   On track     [place] (see longtitudinal plan of care for additional care plan information)  Current Barriers:  Chronic Disease Management support, education, and care coordination needs related to Osteoporosis, HTN, CKD, CHF, Asthma, PAF, ASCVD, hearing loss, CVA  Clinical Goal(s) related to Osteoporosis, HTN, CKD, CHF, Asthma, PAF, ASCVD, hearing loss, CVA:  Over the next 45 days, patient will:  Work with the care management team to address educational, disease management, and care coordination needs  Begin or continue self health monitoring activities as directed today Measure and record weight daily Call provider office for new or worsened signs and symptoms Weight outside established parameters and Shortness of breath Call care management team with questions or concerns Verbalize basic understanding of patient centered plan of care established today  Interventions related to Osteoporosis, HTN, CKD, CHF, Asthma, PAF, ASCVD, hearing loss, CVA:  Evaluation of  current treatment plans and patient's adherence to plan as established by provider Assessed patient understanding of disease states Assessed patient's education and care coordination needs Provided disease specific education to patient  Collaborated with appropriate clinical care team members regarding patient needs Provided with RN Care Manager telephone number and encouraged to reach out as needed  Patient Self Care Activities related to Osteoporosis, HTN, CKD, CHF, Asthma, PAF, ASCVD, hearing loss, CVA:  Patient is unable to independently self-manage chronic health conditions  Initial goal documentation       COMPLETED: Client will communicate with LCSW in next 30 days to talk about ADLs completion and challenges in doing daily activities of clinet (pt-stated)          Current Barriers:  Challenges with ADLs completion in client with Chronic Diagnoses of  HTN< CKD, Hyponatremia, GERD, CHF Some memory challenges  Clinical Social Work Clinical Goal(s):  LCSW will communicate with client in next 30 days to talk with client about ADLs completion and challenges in doing daily activities for client  Interventions: Discussed CCM program support Encouraged client to communicate with RNCM as needed for nursing support Talked with client about her ADLs completion daily Talked with client about physical therapy sessions received by client Talked with client about social support network (she said her daughters are supportive) Talked with client about ambulation challenges of client   Patient Self Care Activities:  Attends scheduled medical appointments  SELF CARE DEFICITS: Challenges in ADLs completion Some mobility challenges    Initial goal documentation       COMPLETED: Daughter, [name]:"I'm concerned about her memory" (pt-stated)        CARE PLAN ENTRY (see longitudinal plan of care for additional care plan information)  Current Barriers:  Care Coordination needs related to  memory loss in a patient  with HTN, CHF, CKD, hx of CVA (disease states) Cognitive Deficits  Nurse Case Manager Clinical Goal(s):  Over the next 45 days, patient will have an initial visit with a neurologist Over the next 30 days, patient/family will reach out to PCP with any new or worsening symptoms  Interventions:  Inter-disciplinary care team collaboration (see longitudinal plan of care) Chart reviewed including recent office notes and lab results Talked with daughter, [name], by telephone Discussed recent decline in memory and cognition Discussed safety concerns Dicussed recent UTI Discussed lower extremity edema Collaborated with PCP clinical staff to add an ammonia level to the labs that home health is drawing Talked with home health nurse about adding an ammonia level Reviewed upcoming appointments Provided with RNCM contact information and encouraged to reach out as needed Encouraged daughter to reach out to PCP with any new or worsening symptoms  Patient Self Care Activities:  Unable to perform IADLs independently Needs assistance with some ADLs  Initial goal documentation       Exercise 3x per week (30 min per time)   Not on track     Increase walking time by 15 minutes per day        Depression Screen PHQ 2/9 Scores 01/10/2021 08/12/2020 08/04/2020 06/28/2020 02/09/2020 01/12/2020 12/01/2019  PHQ - 2 Score - 0 0 0 0 0 0  PHQ- 9 Score - - - - - - -  Exception Documentation Other- indicate reason in comment box - - - - - -  Not completed unable to participate - - - - - -    Fall Risk Fall Risk  01/10/2021 08/12/2020 08/04/2020 06/28/2020 02/09/2020  Falls in the past year? 0 1 1 1  0  Number falls in past yr: 0 1 1 1  -  Injury with Fall? 0 1 1 1  -  Risk for fall due to : Impaired balance/gait;Impaired mobility History of fall(s);Impaired balance/gait;Impaired mobility History of fall(s);Impaired balance/gait;Impaired mobility History of fall(s);Impaired balance/gait;Impaired  mobility -  Follow up Falls evaluation completed Falls evaluation completed Falls evaluation completed Falls evaluation completed Falls evaluation completed    FALL RISK PREVENTION PERTAINING TO THE HOME:  Any stairs in or around the home? No  If so, are there any without handrails?  na Home free of loose throw rugs in walkways, pet beds, electrical cords, etc? Yes  Adequate lighting in your home to reduce risk of falls? Yes   ASSISTIVE DEVICES UTILIZED TO PREVENT FALLS:  Life alert? No  Use of a cane, walker or w/c?  N/a Grab bars in the bathroom? Yes  Shower chair or bench in shower? Yes  Elevated toilet seat or a handicapped toilet? Yes   TIMED UP AND GO:  Was the test performed? No .  Length of time to ambulate 10 feet:  sec.   Nonambulatory   Cognitive Function: MMSE - Mini Mental State Exam 07/26/2020 02/05/2018  Orientation to time 2 5  Orientation to Place 2 5  Registration 3 3  Attention/ Calculation 5 5  Recall 1 3  Language- name 2 objects 2 2  Language- repeat 0 0  Language- follow 3 step command 3 3  Language- read & follow direction 1 1  Write a sentence 1 1  Copy design 0 1  Total score 20 29     6CIT Screen 02/09/2019  What Year? 0 points  What month? 0 points  What time? 0 points  Count back from 20 0 points  Months in reverse 0 points  Repeat phrase 0 points  Total Score 0    Immunizations Immunization History  Administered Date(s) Administered   Fluad Quad(high Dose 65+) 03/24/2019   Influenza Inj Mdck Quad Pf 03/24/2019   Influenza, High Dose Seasonal PF 03/26/2015, 05/01/2017, 04/08/2018   Influenza-Unspecified 04/26/2016   Moderna SARS-COV2 Booster Vaccination 05/11/2020, 10/05/2020   Moderna Sars-Covid-2 Vaccination 07/21/2019, 08/21/2019   Pneumococcal Conjugate-13 09/16/2013, 08/10/2016   Pneumococcal Polysaccharide-23 04/10/1996   Tdap 08/16/2016   Zoster Recombinat (Shingrix) 02/05/2018    TDAP status: Up to date  Flu Vaccine  status: Up to date  Pneumococcal vaccine status: Up to date  Covid-19 vaccine status: Completed vaccines  Qualifies for Shingles Vaccine? No   Zostavax completed No   Shingrix Completed?: No.    Education has been provided regarding the importance of this vaccine. Patient has been advised to call insurance company to determine out of pocket expense if they have not yet received this vaccine. Advised may also receive vaccine at local pharmacy or Health Dept. Verbalized acceptance and understanding.  Screening Tests Health Maintenance  Topic Date Due   Zoster Vaccines- Shingrix (2 of 2) 04/02/2018   INFLUENZA VACCINE  01/23/2021   COVID-19 Vaccine (5 - Booster for Moderna series) 02/04/2021   TETANUS/TDAP  08/16/2026   DEXA SCAN  Completed   PNA vac Low Risk Adult  Completed   HPV VACCINES  Aged Out    Health Maintenance  Health Maintenance Due  Topic Date Due   Zoster Vaccines- Shingrix (2 of 2) 04/02/2018    Colorectal cancer screening: No longer required.   Mammogram status: No longer required due to age.  Dexa: aged out   Lung Cancer Screening: (Low Dose CT Chest recommended if Age 29-80 years, 30 pack-year currently smoking OR have quit w/in 15years.) does not qualify.   Lung Cancer Screening Referral: n/a  Additional Screening:  Hepatitis C Screening: does not qualify; Completed na  Vision Screening: Recommended annual ophthalmology exams for early detection of glaucoma and other disorders of the eye. Is the patient up to date with their annual eye exam?  Yes  Who is the provider or what is the name of the office in which the patient attends annual eye exams? At facility  If pt is not established with a provider, would they like to be referred to a provider to establish care? No .   Dental Screening: Recommended annual dental exams for proper oral hygiene  Community Resource Referral / Chronic Care Management: CRR required this visit?  No   CCM required this  visit?  No      Plan:     I have personally reviewed and noted the following in the patient's chart:   Medical and social history Use of alcohol, tobacco or illicit drugs  Current medications and supplements including opioid prescriptions.  Functional ability and status Nutritional status Physical activity Advanced directives List of other physicians Hospitalizations, surgeries, and ER visits in previous 12 months Vitals Screenings to include cognitive, depression, and falls Referrals and appointments  In addition, I have reviewed and discussed with patient certain preventive protocols, quality metrics, and best practice recommendations. A written personalized care plan for preventive services as well as general preventive health recommendations were provided to patient.     Gerlene Fee, NP   01/10/2021   Nurse Notes:

## 2021-01-11 ENCOUNTER — Telehealth (HOSPITAL_COMMUNITY): Payer: Medicare Other | Admitting: Hematology and Oncology

## 2021-01-11 ENCOUNTER — Encounter: Payer: Self-pay | Admitting: Adult Health

## 2021-01-17 ENCOUNTER — Non-Acute Institutional Stay (SKILLED_NURSING_FACILITY): Payer: Medicare Other | Admitting: Internal Medicine

## 2021-01-17 ENCOUNTER — Encounter: Payer: Self-pay | Admitting: Internal Medicine

## 2021-01-17 DIAGNOSIS — F0280 Dementia in other diseases classified elsewhere without behavioral disturbance: Secondary | ICD-10-CM

## 2021-01-17 DIAGNOSIS — G309 Alzheimer's disease, unspecified: Secondary | ICD-10-CM | POA: Diagnosis not present

## 2021-01-17 DIAGNOSIS — D500 Iron deficiency anemia secondary to blood loss (chronic): Secondary | ICD-10-CM | POA: Diagnosis not present

## 2021-01-17 DIAGNOSIS — I480 Paroxysmal atrial fibrillation: Secondary | ICD-10-CM | POA: Diagnosis not present

## 2021-01-17 DIAGNOSIS — C189 Malignant neoplasm of colon, unspecified: Secondary | ICD-10-CM | POA: Diagnosis not present

## 2021-01-17 DIAGNOSIS — F0150 Vascular dementia without behavioral disturbance: Secondary | ICD-10-CM | POA: Diagnosis not present

## 2021-01-17 DIAGNOSIS — N1832 Chronic kidney disease, stage 3b: Secondary | ICD-10-CM

## 2021-01-17 DIAGNOSIS — Z515 Encounter for palliative care: Secondary | ICD-10-CM | POA: Diagnosis not present

## 2021-01-17 NOTE — Assessment & Plan Note (Signed)
She can provide no history; no behavioral issue reported by staff

## 2021-01-17 NOTE — Assessment & Plan Note (Addendum)
Current creat 0.88/GFR > 60;CKD Stage 2 Medication List reviewed; no nephrotoxic agents identified. Results shared w her daughter

## 2021-01-17 NOTE — Progress Notes (Signed)
   NURSING HOME LOCATION: [facility] ROOM NUMBER:  [id number]  CODE STATUS:  DNR  PCP:  Ok Edwards NP  This is a nursing facility follow up visit of chronic medical diagnoses & to document compliance with Regulation 483.30 (c) in The [place] Manual Phase 2 which mandates caregiver visit ( visits can alternate among physician, PA or NP as per statutes) within 10 days of 30 days / 60 days/ 90 days post admission to SNF date    Interim medical record and care since last SNF visit was updated with review of diagnostic studies and change in clinical status since last visit were documented.  HPI: She is a permanent resident of facility with medical diagnoses of iron deficiency anemia,aortic stenosis, history of asthma, PAF, small bowel AVM, history of bleeding gastric ulcer, essential hypertension, GERD, history of stroke, and dementia. She is presumed to have cancer of ascending colon based on progressive anemia & intermittent rectal bleeding with 2/23 CTAP revealing 6 cm ascending colon mass. Surgeries and procedures include bladder surgery, EGD, cardioversion, and transcatheter aortic valve replacement.  Review of systems: Dementia precluded completion.  She was oriented to person.  She gave her daughter's date of birth as [birth year] but could not give the year.  Actual birthday is [birth year]. Her daughter Baker Janus states that Palliative Care is to see her mother.  They will decide whether to continue the iron infusions for the progressive anemia.  As noted they have elected not to pursue further intervention in reference to likelihood of colon cancer .  Physical exam:  Pertinent or positive findings: She appears her age and frail and chronically ill.  The frontal scalp reveals hypopigmented hair.  She has complete dentures.  Low-grade rales are noted anteriorly.  There is a grade 1 systolic murmur and increased S2.  Bowel sounds were active. Limbs are atrophic and she has  interosseous wasting.  Pedal pulses are decreased but palpable.  There is extensive ecchymoses over the left shin.  The right shin and the right wrist are wrapped.  General appearance: no acute distress, increased work of breathing is present.   Lymphatic: No lymphadenopathy about the head, neck, axilla. Eyes: No conjunctival inflammation or lid edema is present. There is no scleral icterus. Ears:  External ear exam shows no significant lesions or deformities.   Nose:  External nasal examination shows no deformity or inflammation. Nasal mucosa are pink and moist without lesions, exudates Oral exam: There is no oropharyngeal erythema or exudate. Neck:  No thyromegaly, masses, tenderness noted.    Heart:  Normal rate and regular rhythm. S1 normal without gallop, murmur, click, rub .  Lungs: without wheezes, rhonchi, rubs. Abdomen:  Abdomen is soft and nontender with no organomegaly, hernias, masses. GU: Deferred  Extremities:  No cyanosis, clubbing, edema  Neurologic exam :Balance, [place], finger to nose testing could not be completed due to clinical state Skin: Warm & dry w/o tenting.  See summary under each active problem in the Problem List with associated updated therapeutic plan

## 2021-01-17 NOTE — Assessment & Plan Note (Addendum)
Family will decide on continuing iron infusions after Palliative Care consult.

## 2021-01-17 NOTE — Assessment & Plan Note (Addendum)
11/09/2020 H/H 8.7/29.4 down from values of 9.8/31.5 on 09/05/2020.  CEA was 8.1 with normals of 0-4.7. Daughter confirmed family did not elect further intervention 7/26.

## 2021-01-17 NOTE — Assessment & Plan Note (Addendum)
Rhythm is regular; there is slight tachycardia which may be due to her documented anemia.

## 2021-01-17 NOTE — Patient Instructions (Signed)
See assessment and plan under each diagnosis in the problem list and acutely for this visit 

## 2021-01-21 ENCOUNTER — Emergency Department (HOSPITAL_COMMUNITY)
Admission: EM | Admit: 2021-01-21 | Discharge: 2021-01-21 | Disposition: A | Discharge status: Home / Self Care | Payer: Medicare Other | Attending: Emergency Medicine | Admitting: Emergency Medicine

## 2021-01-21 ENCOUNTER — Inpatient Hospital Stay
Admission: RE | Admit: 2021-01-21 | Discharge: 2021-05-25 | Disposition: E | Payer: Medicare Other | Source: Ambulatory Visit | Attending: Internal Medicine | Admitting: Internal Medicine

## 2021-01-21 DIAGNOSIS — K625 Hemorrhage of anus and rectum: Secondary | ICD-10-CM | POA: Diagnosis not present

## 2021-01-21 DIAGNOSIS — F0390 Unspecified dementia without behavioral disturbance: Secondary | ICD-10-CM | POA: Insufficient documentation

## 2021-01-21 DIAGNOSIS — I5032 Chronic diastolic (congestive) heart failure: Secondary | ICD-10-CM | POA: Diagnosis not present

## 2021-01-21 DIAGNOSIS — Z79899 Other long term (current) drug therapy: Secondary | ICD-10-CM | POA: Insufficient documentation

## 2021-01-21 DIAGNOSIS — J449 Chronic obstructive pulmonary disease, unspecified: Secondary | ICD-10-CM | POA: Diagnosis not present

## 2021-01-21 DIAGNOSIS — N183 Chronic kidney disease, stage 3 unspecified: Secondary | ICD-10-CM | POA: Insufficient documentation

## 2021-01-21 DIAGNOSIS — I130 Hypertensive heart and chronic kidney disease with heart failure and stage 1 through stage 4 chronic kidney disease, or unspecified chronic kidney disease: Secondary | ICD-10-CM | POA: Diagnosis not present

## 2021-01-21 DIAGNOSIS — Z85038 Personal history of other malignant neoplasm of large intestine: Secondary | ICD-10-CM | POA: Insufficient documentation

## 2021-01-21 DIAGNOSIS — Z7951 Long term (current) use of inhaled steroids: Secondary | ICD-10-CM | POA: Insufficient documentation

## 2021-01-21 DIAGNOSIS — Z950 Presence of cardiac pacemaker: Secondary | ICD-10-CM | POA: Insufficient documentation

## 2021-01-21 DIAGNOSIS — J45909 Unspecified asthma, uncomplicated: Secondary | ICD-10-CM | POA: Diagnosis not present

## 2021-01-21 LAB — COMPREHENSIVE METABOLIC PANEL
ALT: 14 U/L (ref 0–44)
AST: 24 U/L (ref 15–41)
Albumin: 2.3 g/dL — ABNORMAL LOW (ref 3.5–5.0)
Alkaline Phosphatase: 131 U/L — ABNORMAL HIGH (ref 38–126)
Anion gap: 4 — ABNORMAL LOW (ref 5–15)
BUN: 19 mg/dL (ref 8–23)
CO2: 30 mmol/L (ref 22–32)
Calcium: 7.6 mg/dL — ABNORMAL LOW (ref 8.9–10.3)
Chloride: 104 mmol/L (ref 98–111)
Creatinine, Ser: 1.04 mg/dL — ABNORMAL HIGH (ref 0.44–1.00)
GFR, Estimated: 50 mL/min — ABNORMAL LOW (ref >60)
Glucose, Bld: 112 mg/dL — ABNORMAL HIGH (ref 70–99)
Potassium: 3.3 mmol/L — ABNORMAL LOW (ref 3.5–5.1)
Sodium: 138 mmol/L (ref 135–145)
Total Bilirubin: 0.6 mg/dL (ref 0.3–1.2)
Total Protein: 5.7 g/dL — ABNORMAL LOW (ref 6.5–8.1)

## 2021-01-21 LAB — CBC WITH DIFFERENTIAL/PLATELET
Abs Immature Granulocytes: 0.02 10*3/uL (ref 0.00–0.07)
Basophils Absolute: 0 10*3/uL (ref 0.0–0.1)
Basophils Relative: 1 %
Eosinophils Absolute: 0.1 10*3/uL (ref 0.0–0.5)
Eosinophils Relative: 1 %
HCT: 32.8 % — ABNORMAL LOW (ref 36.0–46.0)
Hemoglobin: 9.8 g/dL — ABNORMAL LOW (ref 12.0–15.0)
Immature Granulocytes: 0 %
Lymphocytes Relative: 11 %
Lymphs Abs: 0.7 10*3/uL (ref 0.7–4.0)
MCH: 28 pg (ref 26.0–34.0)
MCHC: 29.9 g/dL — ABNORMAL LOW (ref 30.0–36.0)
MCV: 93.7 fL (ref 80.0–100.0)
Monocytes Absolute: 0.5 10*3/uL (ref 0.1–1.0)
Monocytes Relative: 8 %
Neutro Abs: 5 10*3/uL (ref 1.7–7.7)
Neutrophils Relative %: 79 %
Platelets: 209 10*3/uL (ref 150–400)
RBC: 3.5 MIL/uL — ABNORMAL LOW (ref 3.87–5.11)
RDW: 17.2 % — ABNORMAL HIGH (ref 11.5–15.5)
WBC: 6.3 10*3/uL (ref 4.0–10.5)
nRBC: 0 % (ref 0.0–0.2)

## 2021-01-21 LAB — TYPE AND SCREEN
ABO/RH(D): A POS
Antibody Screen: NEGATIVE

## 2021-01-21 MED ORDER — SODIUM CHLORIDE 0.9 % IV BOLUS
500.0000 mL | Freq: Once | INTRAVENOUS | Status: AC
  Administered 2021-01-21: 500 mL via INTRAVENOUS

## 2021-01-21 MED ORDER — PANTOPRAZOLE SODIUM 20 MG PO TBEC: 20.0000 mg | DELAYED_RELEASE_TABLET | Freq: Every day | ORAL | 1 refills | Status: DC

## 2021-01-21 MED ORDER — PANTOPRAZOLE SODIUM 40 MG IV SOLR
40.0000 mg | Freq: Once | INTRAVENOUS | Status: AC
  Administered 2021-01-21: 40 mg via INTRAVENOUS
  Filled 2021-01-21: qty 40

## 2021-01-21 NOTE — ED Provider Notes (Signed)
[facility] EMERGENCY DEPARTMENT Provider Note   CSN: [id number] Arrival date & time: 12/28/2020  1129     History Chief Complaint  Patient presents with   Rectal Bleeding    Crystal Cooper is a 87 y.o. female.  Patient presented from the nursing home with some rectal bleeding.  She has a history of gastric ulcers along with small bowel AVM and a colon cancer that has not been removed.  She is suppose to have a hospice consult soon  The history is provided by medical records and the EMS personnel. No language interpreter was used.  Rectal Bleeding Quality:  Bright red Amount:  Scant Timing:  Intermittent Chronicity:  Recurrent Context: not anal fissures   Similar prior episodes: yes   Relieved by:  Nothing Worsened by:  Nothing Ineffective treatments:  None tried Risk factors: no anticoagulant use       Past Medical History:  Diagnosis Date   AKI (acute kidney injury) ([place])    Anemia    Aortic stenosis    Status post TAVR 2017   Arthritis    Asthma    Atrial fibrillation ([place])    AVM (arteriovenous malformation) of small bowel, acquired    CKD (chronic kidney disease) stage 3, GFR 30-59 ml/min (HCC)    Colon cancer (HCC)    Dementia ([place])    Essential hypertension    GERD (gastroesophageal reflux disease)    GI bleed    Glaucoma    Hearing loss    History of pneumonia    Pacemaker    [employer]tr[employer]   Recurrent UTI    Stroke [facility])    SVT (supraventricular tachycardia) (HCC)    S/P ablation of AVNRT in 2003    Patient Active Problem List   Diagnosis Date Noted   Failure to thrive in adult 12/28/2020   Left arm cellulitis 12/28/2020   Hallucinations due to late onset dementia ([place]) 11/15/2020   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 ([place]) 09/21/2020   Chronic constipation 09/21/2020   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 ([place]) 09/21/2020   Aortic atherosclerosis ([place]) 08/26/2020   Colon  cancer ([place]) 08/22/2020   Unspecified protein-calorie malnutrition ([place]) 08/21/2020   Acute GI bleeding 08/17/2020   Ascending Colonic mass--- presumed to be colon cancer 08/17/2020   Physical debility    Pressure injury of skin 08/14/2020   Stroke ([place]) 08/14/2020   Stroke-like symptoms 08/13/2020   Rectal bleeding 07/07/2020   Impairment of balance 06/28/2020   Status post hip surgery    Upper GI bleed 02/19/2020   Acute blood loss anemia 02/19/2020   CKD (chronic kidney disease) stage 3, GFR 30-59 ml/min ([place]) 02/19/2020   Hematochezia 02/19/2020   Iron deficiency anemia due to chronic blood loss 02/18/2020   AVM (arteriovenous malformation) 02/18/2020   COPD (chronic obstructive pulmonary disease) ([place]) 02/18/2020   Dementia without behavioral disturbance ([place]) 02/18/2020   SOB (shortness of breath)    COPD with acute exacerbation ([place])    CAP (community acquired pneumonia) 02/12/2020   Wheezing 09/23/2019   Abnormal breath sounds 09/09/2019   Hypokalemia 08/23/2019   Reactive airway disease 03/24/2019   Gastroesophageal reflux disease 03/24/2019   GI bleed 05/12/2018   S/P TAVR (transcatheter aortic valve replacement) 76/19/5093   Diastolic dysfunction 26/71/2458   Pacemaker 05/12/2018   Iron deficiency anemia    Melena    Duodenal arteriovenous malformation    Gastritis and gastroduodenitis  Normocytic anemia 04/06/2018   Osteoporosis 08/27/2016   ASCVD (arteriosclerotic cardiovascular disease) 01/11/2016   Asthma 01/11/2016   Glaucoma 01/11/2016   Hearing loss 01/11/2016   Other nonrheumatic mitral valve disorders 01/11/2016   Junctional bradycardia    PAF (paroxysmal atrial fibrillation) ([place]) 09/06/2015   Hyponatremia 12/17/2014   Congestive heart failure ([place]) 12/12/2014   Murmur 10/19/2011   Essential hypertension 10/19/2011   History of cardiac radiofrequency ablation (RFA) 10/19/2011    Past Surgical History:  Procedure Laterality Date    APPENDECTOMY     BIOPSY  04/07/2018   Procedure: BIOPSY;  Surgeon: Jerene Bears, MD;  Location: [place];  Service: Gastroenterology;;   BIOPSY  02/19/2020   Procedure: BIOPSY;  Surgeon: Harvel Quale, MD;  [facility];  Service: Gastroenterology;;  gastric    BLADDER SURGERY     CARDIAC CATHETERIZATION N/A 09/26/2015   Procedure: Right/Left Heart Cath and Coronary Angiography;  Surgeon: Burnell Blanks, MD;  Location: [place] [facility];  Service: Cardiovascular;  Laterality: N/A;   CARDIAC SURGERY     CARDIOVERSION N/A 03/02/2016   Procedure: CARDIOVERSION;  Surgeon: Thayer Headings, MD;  Location: [place];  Service: Cardiovascular;  Laterality: N/A;   ENTEROSCOPY  02/19/2020   Procedure: ENTEROSCOPY;  Surgeon: Harvel Quale, MD;  [facility];  Service: Gastroenterology;;   EP IMPLANTABLE DEVICE N/A 10/26/2015   Procedure: Pacemaker Implant;  Surgeon: Will Meredith Leeds, MD;  Location: [place] [facility];  Service: Cardiovascular;  Laterality: N/A;   ESOPHAGOGASTRODUODENOSCOPY (EGD) WITH PROPOFOL N/A 04/07/2018   Procedure: ESOPHAGOGASTRODUODENOSCOPY (EGD) WITH PROPOFOL;  Surgeon: Jerene Bears, MD;  Location: [facility] ENDOSCOPY;  Service: Gastroenterology;  Laterality: N/A;   ESOPHAGOGASTRODUODENOSCOPY (EGD) WITH PROPOFOL N/A 10/12/2019   Procedure: ESOPHAGOGASTRODUODENOSCOPY (EGD) WITH PROPOFOL;  Surgeon: Danie Binder, MD;  [facility];  Service: Endoscopy;  Laterality: N/A;   EYE SURGERY Bilateral    cataract surgery   FLEXIBLE SIGMOIDOSCOPY  02/19/2020   Procedure: FLEXIBLE SIGMOIDOSCOPY;  Surgeon: Harvel Quale, MD;  [facility];  Service: Gastroenterology;;   HIP FRACTURE SURGERY Left 02/2020   HOT HEMOSTASIS N/A 04/07/2018   Procedure: HOT HEMOSTASIS (ARGON PLASMA COAGULATION/BICAP);  Surgeon: Jerene Bears, MD;  Location[facility] ENDOSCOPY;  Service: Gastroenterology;  Laterality: N/A;   HOT  HEMOSTASIS  10/12/2019   Procedure: HOT HEMOSTASIS (ARGON PLASMA COAGULATION/BICAP);  Surgeon: Danie Binder, MD;  [facility];  Service: Endoscopy;;   INTRAMEDULLARY (IM) NAIL INTERTROCHANTERIC Left 03/10/2020   Procedure: OPEN TREATMENT INTERNAL FIXATION LEFT HIP (WITH GAMMA NAIL);  Surgeon: Carole Civil, MD;  Location: [facility];  Service: Orthopedics;  Laterality: Left;   NASAL SINUS SURGERY     PACEMAKER INSERTION     TEE WITHOUT CARDIOVERSION N/A 10/25/2015   Procedure: TRANSESOPHAGEAL ECHOCARDIOGRAM (TEE);  Surgeon: Burnell Blanks, MD;  Location: [place];  Service: Open Heart Surgery;  Laterality: N/A;   TRANSCATHETER AORTIC VALVE REPLACEMENT, TRANSFEMORAL Right 10/25/2015   Procedure: TRANSCATHETER AORTIC VALVE REPLACEMENT, TRANSFEMORAL;  Surgeon: Burnell Blanks, MD;  Location: [place];  Service: Open Heart Surgery;  Laterality: Right;     OB History     Gravida  3   Para  3   Term  3   Preterm      AB      Living  2      SAB      IAB  Ectopic      Multiple      Live Births              Family History  Problem Relation Age of Onset   Hypertension Mother    Pneumonia Father    Stomach cancer Brother    Stroke Brother    AAA (abdominal aortic aneurysm) Brother    Cervical cancer Daughter    Heart attack Neg Hx    Colon cancer Neg Hx    Esophageal cancer Neg Hx     Social History   Tobacco Use   Smoking status: Never   Smokeless tobacco: Never  Vaping Use   Vaping Use: Never used  Substance Use Topics   Alcohol use: No    Alcohol/week: 0.0 standard drinks   Drug use: No    Home Medications Prior to Admission medications   Medication Sig Start Date End Date Taking? Authorizing Provider  pantoprazole (PROTONIX) 20 MG tablet Take 1 tablet (20 mg total) by mouth daily. 01/03/2021  Yes Milton Ferguson, MD  acetaminophen (TYLENOL) 325 MG tablet Take 2 tablets (650 mg total) by mouth every 4 (four) hours as needed for mild  pain (or temp > 37.5 C (99.5 F)). 08/16/20   Barton Dubois, MD  albuterol (PROAIR HFA) 108 (90 Base) MCG/ACT inhaler Inhale 2 puffs into the lungs every 4 (four) hours as needed for wheezing or shortness of breath. 08/05/19   Rakes, Connye Burkitt, FNP  Balsam [place]-Castor Oil OINT Apply 1 application topically daily. Apply to sacrum and bilateral buttocks every shift    [provider]  Budeson-Glycopyrrol-Formoterol (BREZTRI AEROSPHERE) 160-9-4.8 MCG/ACT AERO Inhale 2 Inhalers into the lungs in the morning and at bedtime. 11/27/19   Ivy Lynn, NP  busPIRone (BUSPAR) 5 MG tablet Take 5 mg by mouth 3 (three) times daily.    [provider]  docusate (COLACE) 50 MG/5ML liquid Take 100 mg by mouth 2 (two) times daily.    [provider]  famotidine (PEPCID) 20 MG tablet Take 1 tablet (20 mg total) by mouth at bedtime. 12/21/19 06/25/48  [name], MD  Fe Bisgly-Fe Polysac-Vit C (NIFEREX-150 PO) Take by mouth. Take 150 mg iron- 60 mg-1 mg; oral,Once A Day; 09:00 AM    [provider]  folic acid (FOLVITE) 1 MG tablet Take 1 tablet (1 mg total) by mouth daily. 06/21/20   Erlene Quan, PA-C  furosemide (LASIX) 20 MG tablet Take 30 mg by mouth daily.    [provider]  levalbuterol Penne Lash) 1.25 MG/3ML nebulizer solution Take 1.25 mg by nebulization every 4 (four) hours as needed for wheezing. Dx V40.981 07/07/20   [name], MD  levothyroxine (SYNTHROID) 25 MCG tablet Take 25 mcg by mouth daily before breakfast.    [provider]  loratadine (CLARITIN) 10 MG tablet Take 10 mg by mouth daily.    [provider]  memantine (NAMENDA) 10 MG tablet Take 1 tablet (10 mg total) by mouth 2 (two) times daily. 07/26/20   Frann Rider, NP  metoprolol tartrate (LOPRESSOR) 25 MG tablet Take 12.5 mg by mouth 2 (two) times daily. HOLD FOR SYSTOLIC B/P <= 191 [DX: Paroxysmal atrial fibrillation], also give twice daily as needed for tachycardia over 100     [provider]  morphine (ROXANOL) 20 MG/ML concentrated solution Take 0.25 mLs (5 mg total) by mouth every 4 (four) hours as needed for severe pain. 12/30/20   Gerlene Fee, NP  NON FORMULARY Regular diet    [provider]  Nutritional Supplements (ENSURE CLEAR) LIQD Take 1 Bottle by mouth daily.    [provider]  omeprazole (PRILOSEC) 40 MG capsule Take 40 mg by mouth in the morning and at bedtime.    [provider]  ondansetron (ZOFRAN-ODT) 4 MG disintegrating tablet Take 4 mg by mouth every 6 (six) hours as needed for nausea or vomiting.    [provider]  potassium chloride SA (KLOR-CON) 20 MEQ tablet Take 20 mEq by mouth daily.    [provider]    Allergies    Hctz [hydrochlorothiazide], Aspirin, and Codeine  Review of Systems   Review of Systems  Unable to perform ROS: Mental status change  Gastrointestinal:  Positive for hematochezia.   Physical Exam Updated Vital Signs BP (!) 143/63   Pulse 60   Temp 97.7 F (36.5 C) (Oral)   Resp 17   Ht 5\' 1"  (1.549 m)   Wt 45 kg   SpO2 100%   BMI 18.74 kg/m   Physical Exam Vitals reviewed.  Constitutional:      Appearance: She is well-developed.  HENT:     Head: Normocephalic.     Nose: Nose normal.  Eyes:     General: No scleral icterus.    Conjunctiva/sclera: Conjunctivae normal.  Neck:     Thyroid: No thyromegaly.  Cardiovascular:     Rate and Rhythm: Normal rate and regular rhythm.     Heart sounds: No murmur heard.   No friction rub. No gallop.  Pulmonary:     Breath sounds: No stridor. No wheezing or rales.  Chest:     Chest wall: No tenderness.  Abdominal:     General: There is no distension.     Tenderness: There is no abdominal tenderness. There is no rebound.  Genitourinary:    Comments: Brown stool heme positive Musculoskeletal:        General: Normal range of motion.     Cervical back: Neck supple.  Lymphadenopathy:     Cervical: No  cervical adenopathy.  Skin:    Findings: No erythema or rash.  Neurological:     Mental Status: She is alert.     Motor: No abnormal muscle tone.     Coordination: Coordination normal.     Comments: Oriented to person only  Psychiatric:        Behavior: Behavior normal.    ED Results / Procedures / Treatments   Labs (all labs ordered are listed, but only abnormal results are displayed) Labs Reviewed  CBC WITH DIFFERENTIAL/PLATELET - Abnormal; Notable for the following components:      Result Value   RBC 3.50 (*)    Hemoglobin 9.8 (*)    HCT 32.8 (*)    MCHC 29.9 (*)    RDW 17.2 (*)    All other components within normal limits  COMPREHENSIVE METABOLIC PANEL - Abnormal; Notable for the following components:   Potassium 3.3 (*)    Glucose, Bld 112 (*)    Creatinine, Ser 1.04 (*)    Calcium 7.6 (*)    Total Protein 5.7 (*)    Albumin 2.3 (*)    Alkaline Phosphatase 131 (*)    GFR, Estimated 50 (*)    Anion gap 4 (*)    All other components within normal limits  TYPE AND SCREEN    EKG None  Radiology No results found.  Procedures Procedures   Medications Ordered in ED  Medications  sodium chloride 0.9 % bolus 500 mL (500 mLs Intravenous New Bag/Given 01/04/2021 1328)  pantoprazole (PROTONIX) injection 40 mg (40 mg Intravenous Given 12/31/2020 1320)    ED Course  I have reviewed the triage vital signs and the nursing notes.  Pertinent labs & imaging results that were available during my care of the patient were reviewed by me and considered in my medical decision making (see chart for details).    MDM Rules/Calculators/A&P                           Patient with rectal bleeding and stable hemoglobin and vital signs.  She is placed on Protonix and will go back to the nursing home to follow-up with her doctor at the nursing home Dr. Linna Darner Final Clinical Impression(s) / ED Diagnoses Final diagnoses:  Rectal bleeding    Rx / DC Orders ED Discharge Orders           Ordered    pantoprazole (PROTONIX) 20 MG tablet  Daily        12/29/2020 1447             Milton Ferguson, MD 01/23/21 1036

## 2021-01-21 NOTE — ED Notes (Signed)
Attempted report x 2 

## 2021-01-21 NOTE — Discharge Instructions (Addendum)
Follow-up with Dr. Linna Darner next week.

## 2021-01-21 NOTE — ED Triage Notes (Signed)
Pt. Arrives from [facility] with complaints of rectal bleeding from staff and family.

## 2021-01-21 NOTE — ED Notes (Signed)
Attempted report x1. 

## 2021-01-23 ENCOUNTER — Other Ambulatory Visit (HOSPITAL_COMMUNITY)
Admission: RE | Admit: 2021-01-23 | Discharge: 2021-01-23 | Disposition: A | Discharge status: Home / Self Care | Payer: Medicare Other | Source: Skilled Nursing Facility | Attending: Adult Health | Admitting: Adult Health

## 2021-01-23 ENCOUNTER — Non-Acute Institutional Stay (SKILLED_NURSING_FACILITY): Payer: Medicare Other | Admitting: Adult Health

## 2021-01-23 ENCOUNTER — Encounter: Payer: Self-pay | Admitting: Adult Health

## 2021-01-23 DIAGNOSIS — I509 Heart failure, unspecified: Secondary | ICD-10-CM | POA: Diagnosis not present

## 2021-01-23 DIAGNOSIS — K254 Chronic or unspecified gastric ulcer with hemorrhage: Secondary | ICD-10-CM | POA: Insufficient documentation

## 2021-01-23 DIAGNOSIS — R627 Adult failure to thrive: Secondary | ICD-10-CM

## 2021-01-23 DIAGNOSIS — N3946 Mixed incontinence: Secondary | ICD-10-CM | POA: Diagnosis not present

## 2021-01-23 DIAGNOSIS — N183 Chronic kidney disease, stage 3 unspecified: Secondary | ICD-10-CM | POA: Diagnosis not present

## 2021-01-23 DIAGNOSIS — R630 Anorexia: Secondary | ICD-10-CM | POA: Diagnosis not present

## 2021-01-23 DIAGNOSIS — Z950 Presence of cardiac pacemaker: Secondary | ICD-10-CM | POA: Diagnosis not present

## 2021-01-23 DIAGNOSIS — C189 Malignant neoplasm of colon, unspecified: Secondary | ICD-10-CM | POA: Diagnosis not present

## 2021-01-23 DIAGNOSIS — I4891 Unspecified atrial fibrillation: Secondary | ICD-10-CM | POA: Diagnosis not present

## 2021-01-23 DIAGNOSIS — F0280 Dementia in other diseases classified elsewhere without behavioral disturbance: Secondary | ICD-10-CM | POA: Diagnosis not present

## 2021-01-23 DIAGNOSIS — I639 Cerebral infarction, unspecified: Secondary | ICD-10-CM | POA: Diagnosis not present

## 2021-01-23 DIAGNOSIS — G311 Senile degeneration of brain, not elsewhere classified: Secondary | ICD-10-CM | POA: Diagnosis not present

## 2021-01-23 DIAGNOSIS — G309 Alzheimer's disease, unspecified: Secondary | ICD-10-CM

## 2021-01-23 DIAGNOSIS — J449 Chronic obstructive pulmonary disease, unspecified: Secondary | ICD-10-CM | POA: Diagnosis not present

## 2021-01-23 LAB — CBC WITH DIFFERENTIAL/PLATELET
Abs Immature Granulocytes: 0.01 10*3/uL (ref 0.00–0.07)
Basophils Absolute: 0.1 10*3/uL (ref 0.0–0.1)
Basophils Relative: 1 %
Eosinophils Absolute: 0.1 10*3/uL (ref 0.0–0.5)
Eosinophils Relative: 1 %
HCT: 32.5 % — ABNORMAL LOW (ref 36.0–46.0)
Hemoglobin: 9.9 g/dL — ABNORMAL LOW (ref 12.0–15.0)
Immature Granulocytes: 0 %
Lymphocytes Relative: 17 %
Lymphs Abs: 0.8 10*3/uL (ref 0.7–4.0)
MCH: 28 pg (ref 26.0–34.0)
MCHC: 30.5 g/dL (ref 30.0–36.0)
MCV: 92.1 fL (ref 80.0–100.0)
Monocytes Absolute: 0.4 10*3/uL (ref 0.1–1.0)
Monocytes Relative: 8 %
Neutro Abs: 3.4 10*3/uL (ref 1.7–7.7)
Neutrophils Relative %: 73 %
Platelets: 182 10*3/uL (ref 150–400)
RBC: 3.53 MIL/uL — ABNORMAL LOW (ref 3.87–5.11)
RDW: 16.7 % — ABNORMAL HIGH (ref 11.5–15.5)
WBC: 4.7 10*3/uL (ref 4.0–10.5)
nRBC: 0 % (ref 0.0–0.2)

## 2021-01-23 LAB — IRON AND TIBC
Iron: 24 ug/dL — ABNORMAL LOW (ref 28–170)
Saturation Ratios: 14 % (ref 10.4–31.8)
TIBC: 169 ug/dL — ABNORMAL LOW (ref 250–450)
UIBC: 145 ug/dL

## 2021-01-23 LAB — FERRITIN: Ferritin: 111 ng/mL (ref 11–307)

## 2021-01-23 NOTE — Progress Notes (Signed)
Location:  [facility] Room Number: 142-D Place of Service:  SNF (31)   CODE STATUS: DNR  Allergies  Allergen Reactions  . Hctz [Hydrochlorothiazide] Other (See Comments)    Pt was ill and this affected her kidneys   . Aspirin Other (See Comments)    Cardiologist said the patient is to not take this  . Codeine Other (See Comments)    Made the patient feel ill, has not had any problems since 1977    Chief Complaint  Patient presents with  . Acute Visit    ED follow-up.    HPI:  She was taken to the ED for rectal bleeding. Her hgb is stable. There are no reports of further rectal bleeding. There are no indications of pain; her po intake is very poor. I have spoken at great length with her daughters about her overall poor status. She is not eating or drinking enough to sustain life. She is remain a dnr. Her family is interested hospice care and to stop any unnecessary medications that do not promote comfort.   Past Medical History:  Diagnosis Date  . AKI (acute kidney injury) ([place])   . Anemia   . Aortic stenosis    Status post TAVR 2017  . Arthritis   . Asthma   . Atrial fibrillation ([place])   . AVM (arteriovenous malformation) of small bowel, acquired   . CKD (chronic kidney disease) stage 3, GFR 30-59 ml/min (HCC)   . Colon cancer ([place])   . Dementia ([place])   . Essential hypertension   . GERD (gastroesophageal reflux disease)   . GI bleed   . Glaucoma   . Hearing loss   . History of pneumonia   . Pacemaker    [employer]  . Recurrent UTI   . Stroke ([place])   . SVT (supraventricular tachycardia) (HCC)    S/P ablation of AVNRT in 2003    Past Surgical History:  Procedure Laterality Date  . APPENDECTOMY    . BIOPSY  04/07/2018   Procedure: BIOPSY;  Surgeon: Jerene Bears, MD;  Location: [facility] ENDOSCOPY;  Service: Gastroenterology;;  . BIOPSY  02/19/2020   Procedure: BIOPSY;  Surgeon: Harvel Quale, MD;  [facility] SU[facility];  Service:  Gastroenterology;;  gastric   . BLADDER SURGERY    . CARDIAC CATHETERIZATION N/A 09/26/2015   Procedure: Right/Left Heart Cath and Coronary Angiography;  Surgeon: Burnell Blanks, MD;  Location: [place] CV LAB;  Service: Cardiovascular;  Laterality: N/A;  . CARDIAC SURGERY    . CARDIOVERSION N/A 03/02/2016   Procedure: CARDIOVERSION;  Surgeon: Thayer Headings, MD;  Location: [facility] ENDOSCOPY;  Service: Cardiovascular;  Laterality: N/A;  . ENTEROSCOPY  02/19/2020   Procedure: ENTEROSCOPY;  Surgeon: Harvel Quale, MD;  [facility] ENDO [facility];  Service: Gastroenterology;;  . EP IMPLANTABLE DEVICE N/A 10/26/2015   Procedure: Pacemaker Implant;  Surgeon: Will Meredith Leeds, MD;  Location: [place] CV LAB;  Service: Cardiovascular;  Laterality: N/A;  . ESOPHAGOGASTRODUODENOSCOPY (EGD) WITH PROPOFOL N/A 04/07/2018   Procedure: ESOPHAGOGASTRODUODENOSCOPY (EGD) WITH PROPOFOL;  Surgeon: Jerene Bears, MD;  Location: [facility] ENDOSCOPY;  Service: Gastroenterology;  Laterality: N/A;  . ESOPHAGOGASTRODUODENOSCOPY (EGD) WITH PROPOFOL N/A 10/12/2019   Procedure: ESOPHAGOGASTRODUODENOSCOPY (EGD) WITH PROPOFOL;  Surgeon: Danie Binder, MD;  [facility];  Service: Endoscopy;  Laterality: N/A;  . EYE SURGERY Bilateral    cataract surgery  . FLEXIBLE SIGMOIDOSCOPY  02/19/2020   Procedure: FLEXIBLE SIGMOIDOSCOPY;  Surgeon: Montez Morita, Quillian Quince, MD;  [facility];  Service: Gastroenterology;;  . HIP FRACTURE SURGERY Left 02/2020  . HOT HEMOSTASIS N/A 04/07/2018   Procedure: HOT HEMOSTASIS (ARGON PLASMA COAGULATION/BICAP);  Surgeon: Jerene Bears, MD;  Location: [facility] ENDOSCOPY;  Service: Gastroenterology;  Laterality: N/A;  . HOT HEMOSTASIS  10/12/2019   Procedure: HOT HEMOSTASIS (ARGON PLASMA COAGULATION/BICAP);  Surgeon: Danie Binder, MD;  [facility];  Service: Endoscopy;;  . INTRAMEDULLARY (IM) NAIL INTERTROCHANTERIC Left 03/10/2020   Procedure: OPEN TREATMENT  INTERNAL FIXATION LEFT HIP (WITH GAMMA NAIL);  Surgeon: Carole Civil, MD;  Location: [facility] ORS;  Service: Orthopedics;  Laterality: Left;  . NASAL SINUS SURGERY    . PACEMAKER INSERTION    . TEE WITHOUT CARDIOVERSION N/A 10/25/2015   Procedure: TRANSESOPHAGEAL ECHOCARDIOGRAM (TEE);  Surgeon: Burnell Blanks, MD;  Location: [place];  Service: Open Heart Surgery;  Laterality: N/A;  . TRANSCATHETER AORTIC VALVE REPLACEMENT, TRANSFEMORAL Right 10/25/2015   Procedure: TRANSCATHETER AORTIC VALVE REPLACEMENT, TRANSFEMORAL;  Surgeon: Burnell Blanks, MD;  Location: [place];  Service: Open Heart Surgery;  Laterality: Right;    Social History   Socioeconomic History  . Marital status: Widowed    Spouse name: Not on file  . Number of children: 3  . Years of education: Not on file  . Highest education level: 12th grade  Occupational History  . Occupation: Retired  Tobacco Use  . Smoking status: Never  . Smokeless tobacco: Never  Vaping Use  . Vaping Use: Never used  Substance and Sexual Activity  . Alcohol use: No    Alcohol/week: 0.0 standard drinks  . Drug use: No  . Sexual activity: Not Currently  Other Topics Concern  . Not on file  Social History Narrative   Long term resident of [facility]    Social Determinants of Health   Financial Resource Strain: Not on file  Food Insecurity: Not on file  Transportation Needs: Not on file  Physical Activity: Not on file  Stress: Not on file  Social Connections: Not on file  Intimate Partner Violence: Not on file   Family History  Problem Relation Age of Onset  . Hypertension Mother   . Pneumonia Father   . Stomach cancer Brother   . Stroke Brother   . AAA (abdominal aortic aneurysm) Brother   . Cervical cancer Daughter   . Heart attack Neg Hx   . Colon cancer Neg Hx   . Esophageal cancer Neg Hx       VITAL SIGNS BP 132/78   Pulse 84   Temp 97.7 F (36.5 C)   Resp 16   Ht _0  (1.549 m)   Wt 99 lb 3.2 oz (45 kg)    SpO2 98%   BMI 18.74 kg/m   Outpatient Encounter Medications as of 01/23/2021  Medication Sig  . acetaminophen (TYLENOL) 325 MG tablet Take 2 tablets (650 mg total) by mouth every 4 (four) hours as needed for mild pain (or temp > 37.5 C (99.5 F)).  [name] albuterol (PROAIR HFA) 108 (90 Base) MCG/ACT inhaler Inhale 2 puffs into the lungs every 4 (four) hours as needed for wheezing or shortness of breath.  Roseanne Kaufman [place]-Castor Oil OINT Apply 1 application topically daily. Apply to sacrum and bilateral buttocks every shift  . Budeson-Glycopyrrol-Formoterol (BREZTRI AEROSPHERE) 160-9-4.8 MCG/ACT AERO Inhale 2 Inhalers into the lungs in the morning and at bedtime.  . busPIRone (BUSPAR) 5 MG tablet Take 5 mg  by mouth 3 (three) times daily.  [name] docusate (COLACE) 50 MG/5ML liquid Take 100 mg by mouth 2 (two) times daily.  . famotidine (PEPCID) 20 MG tablet Take 1 tablet (20 mg total) by mouth at bedtime.  [name] Fe Bisgly-Fe Polysac-Vit C (NIFEREX-150 PO) Take by mouth. Take 150 mg iron- 60 mg-1 mg; oral,Once A Day; 16:10 AM  . folic acid (FOLVITE) 1 MG tablet Take 1 tablet (1 mg total) by mouth daily.  . furosemide (LASIX) 20 MG tablet Take 30 mg by mouth daily.  [name] levalbuterol (XOPENEX) 1.25 MG/3ML nebulizer solution Take 1.25 mg by nebulization every 4 (four) hours as needed for wheezing. Dx J45.909  . levothyroxine (SYNTHROID) 25 MCG tablet Take 25 mcg by mouth daily before breakfast.  . loratadine (CLARITIN) 10 MG tablet Take 10 mg by mouth daily.  . memantine (NAMENDA) 10 MG tablet Take 1 tablet (10 mg total) by mouth 2 (two) times daily.  . metoprolol tartrate (LOPRESSOR) 25 MG tablet Take 12.5 mg by mouth 2 (two) times daily. HOLD FOR SYSTOLIC B/P <= 960 [DX: Paroxysmal atrial fibrillation], also give twice daily as needed for tachycardia over 100  . morphine (ROXANOL) 20 MG/ML concentrated solution Take 0.25 mLs (5 mg total) by mouth every 4 (four) hours as needed for severe pain.  . NON FORMULARY Regular  diet  . Nutritional Supplements (ENSURE CLEAR) LIQD Take 1 Bottle by mouth daily.  [name] omeprazole (PRILOSEC) 40 MG capsule Take 40 mg by mouth in the morning and at bedtime.  . ondansetron (ZOFRAN-ODT) 4 MG disintegrating tablet Take 4 mg by mouth every 6 (six) hours as needed for nausea or vomiting.  . potassium chloride SA (KLOR-CON) 20 MEQ tablet Take 20 mEq by mouth daily.  . [DISCONTINUED] pantoprazole (PROTONIX) 20 MG tablet Take 1 tablet (20 mg total) by mouth daily.   No facility-administered encounter medications on file as of 01/23/2021.     SIGNIFICANT DIAGNOSTIC EXAMS   PREVIOUS   08-13-20: ct head:  No acute abnormality. Atrophy and chronic microvascular ischemic disease.  08-13-20: ct angio of head and neck:  1. 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 2. 26 mL ischemic penumbra in the left MCA territory without evidence of a core infarct. 3. Mild-to-moderate cervical carotid atherosclerosis without significant stenosis. 4. Aortic Atherosclerosis   08-14-20: carotid doppler: Color duplex indicates minimal heterogeneous and calcified plaque, with no hemodynamically significant stenosis by duplex criteria in the extracranial cerebrovascular circulation.  08-17-20: ct of chest; abdomen and pelvis Chest Impression: 1. Scattered peribronchial thickening in LEFT and RIGHT lung suggest mild pulmonary inflammation or infection. 2. Focus of consolidation with pleural fluid at the RIGHT lung base differential include pneumonia or round atelectasis.   Abdomen / Pelvis Impression: 1. Circumferential submucosal thickening over 6 cm segment of the ascending colon. 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 Recommend endoscopy for further evaluation. 2. No evidence of metastatic adenopathy other than a small retrocrural lymph node which is favored unrelated 3. No liver metastasis. 4. Multiple diverticula bladder.  NO NEW EXAMS.   LABS REVIEWED PREVIOUS   08-13-20; wbc  7.5; hgb 9.4; hct 31.8; mcv 95.8 plt 270; glucose 122; bun 24; creat 1.28; k+ 3.9; na++ 135; ca 8.7 liver normal albumin 2.9 08-14-20; hgb a1c 4.7; chol 134; ldl 66; trig 96; hdl 49 08-17-20: wbc 7.7; hgb 9.4; hct 30.4; mcv 94.4 plt 242; glucose 111; bun 17; creat 1.16; k+ 3.7; na++ 136; ca 8.9 liver normal albumin 2.7  09-05-20: wbc  8.0; hgb 9.8; hct 31.5; mcv 94.6 plt 245; glucose 123; bun 22; creat 1.55; k+ 4.1; na++ 131; ca 8.5 GFR 21 liver normal albumin 2.9; iron 26; ferritin 222; vit B 12: >7500 09-17-20: tsh 1.637  11-09-20: wbc 4.4; hgb 8.7; hct 29.4; mcv 97.4 plt 180; glucose 99; bun 24; creat 1.16; k+ 4.1; na++ 134; ca 7.8; GFR 45; alk phos 136; albumin 2.2; CEA 8.1; iron 30; tibc 187; ferritin 120   TODAY  12-29-20: glucose 83; bun 21; creat 0.88; k+ 4.5; na++ 138; ca 8.1; GFR >60 01/14/2021: wbc 6.3; hgb 9.8; hct 32.8; mcv 93.7 plt 209; glucose 112; bun 19; creat 1.04; k+ 3.3; na++ 138; ca 7.6; GFR 50; alk phos 131; albumin 2.3 01-23-21: wbc 4.7; hgb 9.9; hct 32.5; mcv 92.1 plt 182; rion 24; tibc 169; ferritin 111     Review of Systems  Unable to perform ROS: Dementia (unable to participate)   Physical Exam Constitutional:      General: She is not in acute distress.    Appearance: She is cachectic. She is not diaphoretic.  Neck:     Thyroid: No thyromegaly.  Cardiovascular:     Rate and Rhythm: Normal rate and regular rhythm.     Heart sounds: Normal heart sounds.  Pulmonary:     Effort: Pulmonary effort is normal. No respiratory distress.     Breath sounds: Normal breath sounds.  Abdominal:     General: Bowel sounds are normal. There is no distension.     Palpations: Abdomen is soft.     Tenderness: There is no abdominal tenderness.  Musculoskeletal:        General: Normal range of motion.     Cervical back: Neck supple.     Right lower leg: No edema.     Left lower leg: No edema.  Lymphadenopathy:     Cervical: No cervical adenopathy.  Skin:    General: Skin is warm and  dry.  Neurological:     Mental Status: She is alert.     Comments: Little verbalization      ASSESSMENT/ PLAN:  TODAY  Failure to thrive in adult Vascular dementia without behavioral disturbance unspecified timing of dementia onset Colon cancer unspecified part of colon   Will setup hospice consult Will stop the following medications; lasix; k+ namenda; lopressor routine inhalers; vitamins; prilosec; synthroid; claritin Will focus her care upon comfort only   Time spent with patient and family: 40 minutes: discussion of her poor status; goals of care; hospice care.   Ok Edwards NP [facility]  Contact [phone] Monday through Friday 8am- 5pm  After hours call [phone]

## 2021-01-23 DEATH — deceased

## 2021-01-24 ENCOUNTER — Inpatient Hospital Stay (HOSPITAL_COMMUNITY): Payer: Medicare Other

## 2021-01-25 DIAGNOSIS — Z950 Presence of cardiac pacemaker: Secondary | ICD-10-CM | POA: Diagnosis not present

## 2021-01-25 DIAGNOSIS — I509 Heart failure, unspecified: Secondary | ICD-10-CM | POA: Diagnosis not present

## 2021-01-25 DIAGNOSIS — G311 Senile degeneration of brain, not elsewhere classified: Secondary | ICD-10-CM | POA: Diagnosis not present

## 2021-01-25 DIAGNOSIS — N183 Chronic kidney disease, stage 3 unspecified: Secondary | ICD-10-CM | POA: Diagnosis not present

## 2021-01-25 DIAGNOSIS — J449 Chronic obstructive pulmonary disease, unspecified: Secondary | ICD-10-CM | POA: Diagnosis not present

## 2021-01-25 DIAGNOSIS — I4891 Unspecified atrial fibrillation: Secondary | ICD-10-CM | POA: Diagnosis not present

## 2021-01-27 DIAGNOSIS — J449 Chronic obstructive pulmonary disease, unspecified: Secondary | ICD-10-CM | POA: Diagnosis not present

## 2021-01-27 DIAGNOSIS — I4891 Unspecified atrial fibrillation: Secondary | ICD-10-CM | POA: Diagnosis not present

## 2021-01-27 DIAGNOSIS — N183 Chronic kidney disease, stage 3 unspecified: Secondary | ICD-10-CM | POA: Diagnosis not present

## 2021-01-27 DIAGNOSIS — Z950 Presence of cardiac pacemaker: Secondary | ICD-10-CM | POA: Diagnosis not present

## 2021-01-27 DIAGNOSIS — G311 Senile degeneration of brain, not elsewhere classified: Secondary | ICD-10-CM | POA: Diagnosis not present

## 2021-01-27 DIAGNOSIS — I509 Heart failure, unspecified: Secondary | ICD-10-CM | POA: Diagnosis not present

## 2021-01-28 DIAGNOSIS — I4891 Unspecified atrial fibrillation: Secondary | ICD-10-CM | POA: Diagnosis not present

## 2021-01-28 DIAGNOSIS — I509 Heart failure, unspecified: Secondary | ICD-10-CM | POA: Diagnosis not present

## 2021-01-28 DIAGNOSIS — N183 Chronic kidney disease, stage 3 unspecified: Secondary | ICD-10-CM | POA: Diagnosis not present

## 2021-01-28 DIAGNOSIS — Z950 Presence of cardiac pacemaker: Secondary | ICD-10-CM | POA: Diagnosis not present

## 2021-01-28 DIAGNOSIS — G311 Senile degeneration of brain, not elsewhere classified: Secondary | ICD-10-CM | POA: Diagnosis not present

## 2021-01-28 DIAGNOSIS — J449 Chronic obstructive pulmonary disease, unspecified: Secondary | ICD-10-CM | POA: Diagnosis not present

## 2021-01-31 ENCOUNTER — Telehealth (HOSPITAL_COMMUNITY): Payer: Medicare Other | Admitting: Hematology

## 2021-02-02 DIAGNOSIS — I4891 Unspecified atrial fibrillation: Secondary | ICD-10-CM | POA: Diagnosis not present

## 2021-02-02 DIAGNOSIS — J449 Chronic obstructive pulmonary disease, unspecified: Secondary | ICD-10-CM | POA: Diagnosis not present

## 2021-02-02 DIAGNOSIS — Z950 Presence of cardiac pacemaker: Secondary | ICD-10-CM | POA: Diagnosis not present

## 2021-02-02 DIAGNOSIS — G311 Senile degeneration of brain, not elsewhere classified: Secondary | ICD-10-CM | POA: Diagnosis not present

## 2021-02-02 DIAGNOSIS — I509 Heart failure, unspecified: Secondary | ICD-10-CM | POA: Diagnosis not present

## 2021-02-02 DIAGNOSIS — N183 Chronic kidney disease, stage 3 unspecified: Secondary | ICD-10-CM | POA: Diagnosis not present

## 2021-02-04 DIAGNOSIS — N183 Chronic kidney disease, stage 3 unspecified: Secondary | ICD-10-CM | POA: Diagnosis not present

## 2021-02-04 DIAGNOSIS — J449 Chronic obstructive pulmonary disease, unspecified: Secondary | ICD-10-CM | POA: Diagnosis not present

## 2021-02-04 DIAGNOSIS — G311 Senile degeneration of brain, not elsewhere classified: Secondary | ICD-10-CM | POA: Diagnosis not present

## 2021-02-04 DIAGNOSIS — I4891 Unspecified atrial fibrillation: Secondary | ICD-10-CM | POA: Diagnosis not present

## 2021-02-04 DIAGNOSIS — Z950 Presence of cardiac pacemaker: Secondary | ICD-10-CM | POA: Diagnosis not present

## 2021-02-04 DIAGNOSIS — I509 Heart failure, unspecified: Secondary | ICD-10-CM | POA: Diagnosis not present

## 2021-02-08 DIAGNOSIS — Z950 Presence of cardiac pacemaker: Secondary | ICD-10-CM | POA: Diagnosis not present

## 2021-02-08 DIAGNOSIS — G311 Senile degeneration of brain, not elsewhere classified: Secondary | ICD-10-CM | POA: Diagnosis not present

## 2021-02-08 DIAGNOSIS — J449 Chronic obstructive pulmonary disease, unspecified: Secondary | ICD-10-CM | POA: Diagnosis not present

## 2021-02-08 DIAGNOSIS — I509 Heart failure, unspecified: Secondary | ICD-10-CM | POA: Diagnosis not present

## 2021-02-08 DIAGNOSIS — I4891 Unspecified atrial fibrillation: Secondary | ICD-10-CM | POA: Diagnosis not present

## 2021-02-08 DIAGNOSIS — N183 Chronic kidney disease, stage 3 unspecified: Secondary | ICD-10-CM | POA: Diagnosis not present

## 2021-02-09 ENCOUNTER — Non-Acute Institutional Stay (SKILLED_NURSING_FACILITY): Payer: Medicare Other | Admitting: Adult Health

## 2021-02-09 ENCOUNTER — Encounter: Payer: Self-pay | Admitting: Adult Health

## 2021-02-09 DIAGNOSIS — J439 Emphysema, unspecified: Secondary | ICD-10-CM | POA: Diagnosis not present

## 2021-02-09 DIAGNOSIS — I700 Atherosclerosis of aorta: Secondary | ICD-10-CM | POA: Diagnosis not present

## 2021-02-09 DIAGNOSIS — F0280 Dementia in other diseases classified elsewhere without behavioral disturbance: Secondary | ICD-10-CM

## 2021-02-09 DIAGNOSIS — G309 Alzheimer's disease, unspecified: Secondary | ICD-10-CM | POA: Diagnosis not present

## 2021-02-09 DIAGNOSIS — C189 Malignant neoplasm of colon, unspecified: Secondary | ICD-10-CM | POA: Diagnosis not present

## 2021-02-09 NOTE — Progress Notes (Signed)
Location:  [place] Room Number: 142 Place of Service:  SNF (31)   CODE STATUS: dnr  Allergies  Allergen Reactions   Hctz [Hydrochlorothiazide] Other (See Comments)    Pt was ill and this affected her kidneys    Aspirin Other (See Comments)    Cardiologist said the patient is to not take this   Codeine Other (See Comments)    Made the patient feel ill, has not had any problems since 1977    Chief Complaint  Patient presents with   Acute Visit    Care plan meeting     HPI:  We have come together for her care plan meeting.  BIMS none mood 0/30. She requires extensive assist with adls. She is able to feed self after setup. She is incontinent of bladder and bowel. No falls. Dietary: weight is 89.2 pounds down 19 pounds in 4 months. Regular diet.    therapy none at this time. She will continue to be followed for her chronic illnesses including: Aortic atherosclerosis  Alzheimer's dementia without behavioral disturbance unspecified timing of dementia onset  Malignant neoplasm of colon unspecified part of colon   Pulmonary emphysema unspecified emphysema type  Past Medical History:  Diagnosis Date   AKI (acute kidney injury) ([place])    Anemia    Aortic stenosis    Status post TAVR 2017   Arthritis    Asthma    Atrial fibrillation ([place])    AVM (arteriovenous malformation) of small bowel, acquired    CKD (chronic kidney disease) stage 3, GFR 30-59 ml/min (HCC)    Colon cancer (HCC)    Dementia (HCC)    Essential hypertension    GERD (gastroesophageal reflux disease)    GI bleed    Glaucoma    Hearing loss    History of pneumonia    Pacemaker    [employer]   Recurrent UTI    Stroke [facility])    SVT (supraventricular tachycardia) (HCC)    S/P ablation of AVNRT in 2003    Past Surgical History:  Procedure Laterality Date   APPENDECTOMY     BIOPSY  04/07/2018   Procedure: BIOPSY;  Surgeon: Jerene Bears, MD;  Location: [place];  Service:  Gastroenterology;;   BIOPSY  02/19/2020   Procedure: BIOPSY;  Surgeon: Harvel Quale, MD;  [facility];  Service: Gastroenterology;;  gastric    BLADDER SURGERY     CARDIAC CATHETERIZATION N/A 09/26/2015   Procedure: Right/Left Heart Cath and Coronary Angiography;  Surgeon: Burnell Blanks, MD;  Location: [place] [facility];  Service: Cardiovascular;  Laterality: N/A;   CARDIAC SURGERY     CARDIOVERSION N/A 03/02/2016   Procedure: CARDIOVERSION;  Surgeon: Thayer Headings, MD;  Location: [place];  Service: Cardiovascular;  Laterality: N/A;   ENTEROSCOPY  02/19/2020   Procedure: ENTEROSCOPY;  Surgeon: Harvel Quale, MD;  [facility];  Service: Gastroenterology;;   EP IMPLANTABLE DEVICE N/A 10/26/2015   Procedure: Pacemaker Implant;  Surgeon: Will Meredith Leeds, MD;  Location: [place] [facility];  Service: Cardiovascular;  Laterality: N/A;   ESOPHAGOGASTRODUODENOSCOPY (EGD) WITH PROPOFOL N/A 04/07/2018   Procedure: ESOPHAGOGASTRODUODENOSCOPY (EGD) WITH PROPOFOL;  Surgeon: Jerene Bears, MD;  Location: [facility] ENDOSCOPY;  Service: Gastroenterology;  Laterality: N/A;   ESOPHAGOGASTRODUODENOSCOPY (EGD) WITH PROPOFOL N/A 10/12/2019   Procedure: ESOPHAGOGASTRODUODENOSCOPY (EGD) WITH PROPOFOL;  Surgeon: Danie Binder, MD;  [facility];  Service: Endoscopy;  Laterality: N/A;  EYE SURGERY Bilateral    cataract surgery   FLEXIBLE SIGMOIDOSCOPY  02/19/2020   Procedure: FLEXIBLE SIGMOIDOSCOPY;  Surgeon: Harvel Quale, MD;  [facility];  Service: Gastroenterology;;   HIP FRACTURE SURGERY Left 02/2020   HOT HEMOSTASIS N/A 04/07/2018   Procedure: HOT HEMOSTASIS (ARGON PLASMA COAGULATION/BICAP);  Surgeon: Jerene Bears, MD;  Location[facility] ENDOSCOPY;  Service: Gastroenterology;  Laterality: N/A;   HOT HEMOSTASIS  10/12/2019   Procedure: HOT HEMOSTASIS (ARGON PLASMA COAGULATION/BICAP);  Surgeon: Danie Binder, MD;  [facility];  Service: Endoscopy;;   INTRAMEDULLARY (IM) NAIL INTERTROCHANTERIC Left 03/10/2020   Procedure: OPEN TREATMENT INTERNAL FIXATION LEFT HIP (WITH GAMMA NAIL);  Surgeon: Carole Civil, MD;  Location: [facility];  Service: Orthopedics;  Laterality: Left;   NASAL SINUS SURGERY     PACEMAKER INSERTION     TEE WITHOUT CARDIOVERSION N/A 10/25/2015   Procedure: TRANSESOPHAGEAL ECHOCARDIOGRAM (TEE);  Surgeon: Burnell Blanks, MD;  Location: [place];  Service: Open Heart Surgery;  Laterality: N/A;   TRANSCATHETER AORTIC VALVE REPLACEMENT, TRANSFEMORAL Right 10/25/2015   Procedure: TRANSCATHETER AORTIC VALVE REPLACEMENT, TRANSFEMORAL;  Surgeon: Burnell Blanks, MD;  Location: [place];  Service: Open Heart Surgery;  Laterality: Right;    Social History   Socioeconomic History   Marital status: Widowed    Spouse name: Not on file   Number of children: 3   Years of education: Not on file   Highest education level: 12th grade  Occupational History   Occupation: Retired  Tobacco Use   Smoking status: Never   Smokeless tobacco: Never  Vaping Use   Vaping Use: Never used  Substance and Sexual Activity   Alcohol use: No    Alcohol/week: 0.0 standard drinks   Drug use: No   Sexual activity: Not Currently  Other Topics Concern   Not on file  Social History Narrative   Long term resident of [facility]    Social Determinants of Health   Financial Resource Strain: Not on file  Food Insecurity: Not on file  Transportation Needs: Not on file  Physical Activity: Not on file  Stress: Not on file  Social Connections: Not on file  Intimate Partner Violence: Not on file   Family History  Problem Relation Age of Onset   Hypertension Mother    Pneumonia Father    Stomach cancer Brother    Stroke Brother    AAA (abdominal aortic aneurysm) Brother    Cervical cancer Daughter    Heart attack Neg Hx    Colon cancer Neg Hx    Esophageal cancer Neg Hx       VITAL SIGNS BP 137/61    Pulse 66   Temp (!) 97.4 F (36.3 C)   Ht 5' 1"  (1.549 m)   Wt 89 lb 3.2 oz (40.5 kg)   BMI 16.85 kg/m   Outpatient Encounter Medications as of 02/09/2021  Medication Sig   acetaminophen (TYLENOL) 325 MG tablet Take 2 tablets (650 mg total) by mouth every 4 (four) hours as needed for mild pain (or temp > 37.5 C (99.5 F)).   albuterol (PROAIR HFA) 108 (90 Base) MCG/ACT inhaler Inhale 2 puffs into the lungs every 4 (four) hours as needed for wheezing or shortness of breath.   Balsam [place]-Castor Oil OINT Apply 1 application topically daily. Apply to sacrum and bilateral buttocks every shift   Budeson-Glycopyrrol-Formoterol (BREZTRI AEROSPHERE) 160-9-4.8 MCG/ACT AERO Inhale 2 Inhalers into the lungs in the morning  and at bedtime.   busPIRone (BUSPAR) 5 MG tablet Take 5 mg by mouth 3 (three) times daily.   docusate (COLACE) 50 MG/5ML liquid Take 100 mg by mouth 2 (two) times daily.   famotidine (PEPCID) 20 MG tablet Take 1 tablet (20 mg total) by mouth at bedtime.   Fe Bisgly-Fe Polysac-Vit C (NIFEREX-150 PO) Take by mouth. Take 150 mg iron- 60 mg-1 mg; oral,Once A Day; 98:92 AM   folic acid (FOLVITE) 1 MG tablet Take 1 tablet (1 mg total) by mouth daily.   furosemide (LASIX) 20 MG tablet Take 30 mg by mouth daily.   levalbuterol (XOPENEX) 1.25 MG/3ML nebulizer solution Take 1.25 mg by nebulization every 4 (four) hours as needed for wheezing. Dx J45.909   levothyroxine (SYNTHROID) 25 MCG tablet Take 25 mcg by mouth daily before breakfast.   loratadine (CLARITIN) 10 MG tablet Take 10 mg by mouth daily.   memantine (NAMENDA) 10 MG tablet Take 1 tablet (10 mg total) by mouth 2 (two) times daily.   metoprolol tartrate (LOPRESSOR) 25 MG tablet Take 12.5 mg by mouth 2 (two) times daily. HOLD FOR SYSTOLIC B/P <= 119 [DX: Paroxysmal atrial fibrillation], also give twice daily as needed for tachycardia over 100   morphine (ROXANOL) 20 MG/ML concentrated solution Take 0.25 mLs (5 mg total) by mouth every  4 (four) hours as needed for severe pain.   NON FORMULARY Regular diet   Nutritional Supplements (ENSURE CLEAR) LIQD Take 1 Bottle by mouth daily.   omeprazole (PRILOSEC) 40 MG capsule Take 40 mg by mouth in the morning and at bedtime.   ondansetron (ZOFRAN-ODT) 4 MG disintegrating tablet Take 4 mg by mouth every 6 (six) hours as needed for nausea or vomiting.   potassium chloride SA (KLOR-CON) 20 MEQ tablet Take 20 mEq by mouth daily.   No facility-administered encounter medications on file as of 02/09/2021.     SIGNIFICANT DIAGNOSTIC EXAMS  PREVIOUS   08-13-20: ct head:  No acute abnormality. Atrophy and chronic microvascular ischemic disease.  08-13-20: ct angio of head and neck:  1. 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 2. 26 mL ischemic penumbra in the left MCA territory without evidence of a core infarct. 3. Mild-to-moderate cervical carotid atherosclerosis without significant stenosis. 4. Aortic Atherosclerosis   08-14-20: carotid doppler: Color duplex indicates minimal heterogeneous and calcified plaque, with no hemodynamically significant stenosis by duplex criteria in the extracranial cerebrovascular circulation.  08-17-20: ct of chest; abdomen and pelvis Chest Impression: 1. Scattered peribronchial thickening in LEFT and RIGHT lung suggest mild pulmonary inflammation or infection. 2. Focus of consolidation with pleural fluid at the RIGHT lung base differential include pneumonia or round atelectasis.   Abdomen / Pelvis Impression: 1. Circumferential submucosal thickening over 6 cm segment of the ascending colon. 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 Recommend endoscopy for further evaluation. 2. No evidence of metastatic adenopathy other than a small retrocrural lymph node which is favored unrelated 3. No liver metastasis. 4. Multiple diverticula bladder.  NO NEW EXAMS.   LABS REVIEWED PREVIOUS   08-13-20; wbc 7.5; hgb 9.4; hct 31.8; mcv 95.8 plt  270; glucose 122; bun 24; creat 1.28; k+ 3.9; na++ 135; ca 8.7 liver normal albumin 2.9 08-14-20; hgb a1c 4.7; chol 134; ldl 66; trig 96; hdl 49 08-17-20: wbc 7.7; hgb 9.4; hct 30.4; mcv 94.4 plt 242; glucose 111; bun 17; creat 1.16; k+ 3.7; na++ 136; ca 8.9 liver normal albumin 2.7  09-05-20: wbc 8.0; hgb 9.8; hct 31.5;  mcv 94.6 plt 245; glucose 123; bun 22; creat 1.55; k+ 4.1; na++ 131; ca 8.5 GFR 21 liver normal albumin 2.9; iron 26; ferritin 222; vit B 12: >7500 09-17-20: tsh 1.637  11-09-20: wbc 4.4; hgb 8.7; hct 29.4; mcv 97.4 plt 180; glucose 99; bun 24; creat 1.16; k+ 4.1; na++ 134; ca 7.8; GFR 45; alk phos 136; albumin 2.2; CEA 8.1; iron 30; tibc 187; ferritin 120  12-29-20: glucose 83; bun 21; creat 0.88; k+ 4.5; na++ 138; ca 8.1; GFR >60 01/06/2021: wbc 6.3; hgb 9.8; hct 32.8; mcv 93.7 plt 209; glucose 112; bun 19; creat 1.04; k+ 3.3; na++ 138; ca 7.6; GFR 50; alk phos 131; albumin 2.3 01-23-21: wbc 4.7; hgb 9.9; hct 32.5; mcv 92.1 plt 182; iron 24; tibc 169; ferritin 111    NO NEW LABS.   Review of Systems  Unable to perform ROS: Dementia (unable to participate)    Physical Exam Constitutional:      General: She is not in acute distress.    Appearance: She is cachectic. She is not diaphoretic.  Neck:     Thyroid: No thyromegaly.  Cardiovascular:     Rate and Rhythm: Normal rate and regular rhythm.     Pulses: Normal pulses.     Heart sounds: Normal heart sounds.  Pulmonary:     Effort: Pulmonary effort is normal. No respiratory distress.     Breath sounds: Normal breath sounds.  Abdominal:     General: Bowel sounds are normal. There is no distension.     Palpations: Abdomen is soft.     Tenderness: There is no abdominal tenderness.  Musculoskeletal:     Cervical back: Neck supple.     Right lower leg: No edema.     Left lower leg: No edema.     Comments: Is able to move extremities   Lymphadenopathy:     Cervical: No cervical adenopathy.  Skin:    General: Skin is warm and  dry.  Neurological:     Mental Status: She is alert.     Comments: Little verbalization      ASSESSMENT/ PLAN:  TODAY  Aortic atherosclerosis Alzheimer's dementia without behavioral disturbance unspecified timing of dementia onset Malignant neoplasm of colon unspecified part of colon Pulmonary emphysema unspecified emphysema type  Will continue current medications Will continue current plan of care Will continue to monitor her status.  Will continue to be followed by hospice    Time spent with patient: 40 minutes: goals of care; hospice care; medications.    Ok Edwards NP [facility]  Contact [phone] Monday through Friday 8am- 5pm  After hours call [phone]

## 2021-02-11 DIAGNOSIS — I509 Heart failure, unspecified: Secondary | ICD-10-CM | POA: Diagnosis not present

## 2021-02-11 DIAGNOSIS — I4891 Unspecified atrial fibrillation: Secondary | ICD-10-CM | POA: Diagnosis not present

## 2021-02-11 DIAGNOSIS — Z950 Presence of cardiac pacemaker: Secondary | ICD-10-CM | POA: Diagnosis not present

## 2021-02-11 DIAGNOSIS — G311 Senile degeneration of brain, not elsewhere classified: Secondary | ICD-10-CM | POA: Diagnosis not present

## 2021-02-11 DIAGNOSIS — J449 Chronic obstructive pulmonary disease, unspecified: Secondary | ICD-10-CM | POA: Diagnosis not present

## 2021-02-11 DIAGNOSIS — N183 Chronic kidney disease, stage 3 unspecified: Secondary | ICD-10-CM | POA: Diagnosis not present

## 2021-02-15 DIAGNOSIS — Z950 Presence of cardiac pacemaker: Secondary | ICD-10-CM | POA: Diagnosis not present

## 2021-02-15 DIAGNOSIS — I509 Heart failure, unspecified: Secondary | ICD-10-CM | POA: Diagnosis not present

## 2021-02-15 DIAGNOSIS — N183 Chronic kidney disease, stage 3 unspecified: Secondary | ICD-10-CM | POA: Diagnosis not present

## 2021-02-15 DIAGNOSIS — J449 Chronic obstructive pulmonary disease, unspecified: Secondary | ICD-10-CM | POA: Diagnosis not present

## 2021-02-15 DIAGNOSIS — G311 Senile degeneration of brain, not elsewhere classified: Secondary | ICD-10-CM | POA: Diagnosis not present

## 2021-02-15 DIAGNOSIS — I4891 Unspecified atrial fibrillation: Secondary | ICD-10-CM | POA: Diagnosis not present

## 2021-02-17 ENCOUNTER — Encounter: Payer: Self-pay | Admitting: Adult Health

## 2021-02-17 ENCOUNTER — Non-Acute Institutional Stay (SKILLED_NURSING_FACILITY): Payer: Medicare Other | Admitting: Adult Health

## 2021-02-17 DIAGNOSIS — E539 Vitamin B deficiency, unspecified: Secondary | ICD-10-CM

## 2021-02-17 DIAGNOSIS — I5032 Chronic diastolic (congestive) heart failure: Secondary | ICD-10-CM | POA: Diagnosis not present

## 2021-02-17 DIAGNOSIS — I700 Atherosclerosis of aorta: Secondary | ICD-10-CM | POA: Diagnosis not present

## 2021-02-17 DIAGNOSIS — F0391 Unspecified dementia with behavioral disturbance: Secondary | ICD-10-CM | POA: Diagnosis not present

## 2021-02-17 DIAGNOSIS — F0394 Unspecified dementia, unspecified severity, with anxiety: Secondary | ICD-10-CM

## 2021-02-17 NOTE — Progress Notes (Signed)
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 Chronic diastolic congestive heart failure. There are no reports of uncontrolled pain; no reports of agitation or anxiety. She continues to be followed by hospice care.   Past Medical History:  Diagnosis Date   AKI (acute kidney injury) ([place])    Anemia    Aortic stenosis    Status post TAVR 2017   Arthritis    Asthma    Atrial fibrillation (HCC)    AVM (arteriovenous malformation) of small bowel, acquired    CKD (chronic kidney disease) stage 3, GFR 30-59 ml/min (HCC)    Colon cancer (HCC)    Dementia (HCC)    Essential hypertension    GERD (gastroesophageal reflux disease)    GI bleed    Glaucoma    Hearing loss    History of pneumonia    Pacemaker    [employer]   Recurrent UTI    Stroke [facility])    SVT (supraventricular tachycardia) (HCC)    S/P ablation of AVNRT in 2003    Past Surgical History:  Procedure Laterality Date   APPENDECTOMY     BIOPSY  04/07/2018   Procedure: BIOPSY;  Surgeon: Jerene Bears, MD;  Location: [place];  Service: Gastroenterology;;   BIOPSY  02/19/2020   Procedure: BIOPSY;  Surgeon: Harvel Quale, MD;   [facility];  Service: Gastroenterology;;  gastric    BLADDER SURGERY     CARDIAC CATHETERIZATION N/A 09/26/2015   Procedure: Right/Left Heart Cath and Coronary Angiography;  Surgeon: Burnell Blanks, MD;  Location: [place] CV [facility];  Service: Cardiovascular;  Laterality: N/A;   CARDIAC SURGERY     CARDIOVERSION N/A 03/02/2016   Procedure: CARDIOVERSION;  Surgeon: Thayer Headings, MD;  Location: [place];  Service: Cardiovascular;  Laterality: N/A;   ENTEROSCOPY  02/19/2020   Procedure: ENTEROSCOPY;  Surgeon: Harvel Quale, MD;  [facility];  Service: Gastroenterology;;   EP IMPLANTABLE DEVICE N/A 10/26/2015   Procedure: Pacemaker Implant;  Surgeon: Will Meredith Leeds, MD;  Location: [place] [facility];  Service: Cardiovascular;  Laterality: N/A;   ESOPHAGOGASTRODUODENOSCOPY (EGD) WITH PROPOFOL N/A 04/07/2018   Procedure: ESOPHAGOGASTRODUODENOSCOPY (EGD) WITH PROPOFOL;  Surgeon: Jerene Bears, MD;  [facility] ENDOSCOPY;  Service: Gastroenterology;  Laterality: N/A;   ESOPHAGOGASTRODUODENOSCOPY (EGD) WITH PROPOFOL N/A 10/12/2019   Procedure: ESOPHAGOGASTRODUODENOSCOPY (EGD) WITH PROPOFOL;  Surgeon: Danie Binder, MD;  [facility];  Service: Endoscopy;  Laterality: N/A;   EYE SURGERY Bilateral    cataract surgery  FLEXIBLE SIGMOIDOSCOPY  02/19/2020   Procedure: FLEXIBLE SIGMOIDOSCOPY;  Surgeon: Harvel Quale, MD;  [facility];  Service: Gastroenterology;;   HIP FRACTURE SURGERY Left 02/2020   HOT HEMOSTASIS N/A 04/07/2018   Procedure: HOT HEMOSTASIS (ARGON PLASMA COAGULATION/BICAP);  Surgeon: Jerene Bears, MD;  Location: [facility] ENDOSCOPY;  Service: Gastroenterology;  Laterality: N/A;   HOT HEMOSTASIS  10/12/2019   Procedure: HOT HEMOSTASIS (ARGON PLASMA COAGULATION/BICAP);  Surgeon: Danie Binder, MD;  [facility];  Service: Endoscopy;;   INTRAMEDULLARY (IM) NAIL INTERTROCHANTERIC Left 03/10/2020    Procedure: OPEN TREATMENT INTERNAL FIXATION LEFT HIP (WITH GAMMA NAIL);  Surgeon: Carole Civil, MD;  Location: [facility];  Service: Orthopedics;  Laterality: Left;   NASAL SINUS SURGERY     PACEMAKER INSERTION     TEE WITHOUT CARDIOVERSION N/A 10/25/2015   Procedure: TRANSESOPHAGEAL ECHOCARDIOGRAM (TEE);  Surgeon: Burnell Blanks, MD;  Location: [place];  Service: Open Heart Surgery;  Laterality: N/A;   TRANSCATHETER AORTIC VALVE REPLACEMENT, TRANSFEMORAL Right 10/25/2015   Procedure: TRANSCATHETER AORTIC VALVE REPLACEMENT, TRANSFEMORAL;  Surgeon: Burnell Blanks, MD;  Location: [place];  Service: Open Heart Surgery;  Laterality: Right;    Social History   Socioeconomic History   Marital status: Widowed    Spouse name: Not on file   Number of children: 3   Years of education: Not on file   Highest education level: 12th grade  Occupational History   Occupation: Retired  Tobacco Use   Smoking status: Never   Smokeless tobacco: Never  Vaping Use   Vaping Use: Never used  Substance and Sexual Activity   Alcohol use: No    Alcohol/week: 0.0 standard drinks   Drug use: No   Sexual activity: Not Currently  Other Topics Concern   Not on file  Social History Narrative   [facility] term resident of [facility]    Social Determinants of Health   Financial Resource Strain: Not on file  Food Insecurity: Not on file  Transportation Needs: Not on file  Physical Activity: Not on file  Stress: Not on file  Social Connections: Not on file  Intimate Partner Violence: Not on file   Family History  Problem Relation Age of Onset   Hypertension Mother    Pneumonia Father    Stomach cancer Brother    Stroke Brother    AAA (abdominal aortic aneurysm) Brother    Cervical cancer Daughter    Heart attack Neg Hx    Colon cancer Neg Hx    Esophageal cancer Neg Hx       VITAL SIGNS BP (!) 172/82   Pulse 69   Temp 98.1 F (36.7 C)   Resp 16   Ht 5' 1"  (1.549 m)   Wt 89 lb 3.2 oz (40.5  kg)   SpO2 98%   BMI 16.85 kg/m   Outpatient Encounter Medications as of 02/17/2021  Medication Sig   acetaminophen (TYLENOL) 325 MG tablet Take 2 tablets (650 mg total) by mouth every 4 (four) hours as needed for mild pain (or temp > 37.5 C (99.5 F)).   albuterol (PROAIR HFA) 108 (90 Base) MCG/ACT inhaler Inhale 2 puffs into the lungs every 4 (four) hours as needed for wheezing or shortness of breath.   Balsam [place]-Castor Oil OINT Apply 1 application topically daily. Apply to sacrum and bilateral buttocks every shift   busPIRone (BUSPAR) 5 MG tablet Take 5 mg by mouth 3 (three) times daily.   docusate (  COLACE) 50 MG/5ML liquid Take 100 mg by mouth 2 (two) times daily.   famotidine (PEPCID) 20 MG tablet Take 1 tablet (20 mg total) by mouth at bedtime.   levalbuterol (XOPENEX) 1.25 MG/3ML nebulizer solution Take 1.25 mg by nebulization every 4 (four) hours as needed for wheezing. Dx J45.909   morphine (ROXANOL) 20 MG/ML concentrated solution Take 0.25 mLs (5 mg total) by mouth every 4 (four) hours as needed for severe pain.   NON FORMULARY Regular diet   Nutritional Supplements (ENSURE CLEAR) LIQD Take 1 Bottle by mouth daily.   ondansetron (ZOFRAN-ODT) 4 MG disintegrating tablet Take 4 mg by mouth every 6 (six) hours as needed for nausea or vomiting.   [DISCONTINUED] Budeson-Glycopyrrol-Formoterol (BREZTRI AEROSPHERE) 160-9-4.8 MCG/ACT AERO Inhale 2 Inhalers into the lungs in the morning and at bedtime.   [DISCONTINUED] Fe Bisgly-Fe Polysac-Vit C (NIFEREX-150 PO) Take by mouth. Take 150 mg iron- 60 mg-1 mg; oral,Once A Day; 09:00 AM   [DISCONTINUED] folic acid (FOLVITE) 1 MG tablet Take 1 tablet (1 mg total) by mouth daily.   [DISCONTINUED] furosemide (LASIX) 20 MG tablet Take 30 mg by mouth daily.   [DISCONTINUED] levothyroxine (SYNTHROID) 25 MCG tablet Take 25 mcg by mouth daily before breakfast.   [DISCONTINUED] loratadine (CLARITIN) 10 MG tablet Take 10 mg by mouth daily.   [DISCONTINUED]  memantine (NAMENDA) 10 MG tablet Take 1 tablet (10 mg total) by mouth 2 (two) times daily.   [DISCONTINUED] metoprolol tartrate (LOPRESSOR) 25 MG tablet Take 12.5 mg by mouth 2 (two) times daily. HOLD FOR SYSTOLIC B/P <= 188 [DX: Paroxysmal atrial fibrillation], also give twice daily as needed for tachycardia over 100   [DISCONTINUED] omeprazole (PRILOSEC) 40 MG capsule Take 40 mg by mouth in the morning and at bedtime.   [DISCONTINUED] potassium chloride SA (KLOR-CON) 20 MEQ tablet Take 20 mEq by mouth daily.   No facility-administered encounter medications on file as of 02/17/2021.     SIGNIFICANT DIAGNOSTIC EXAMS   PREVIOUS   08-13-20: ct head:  No acute abnormality. Atrophy and chronic microvascular ischemic disease.  08-13-20: ct angio of head and neck:  1. 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 2. 26 mL ischemic penumbra in the left MCA territory without evidence of a core infarct. 3. Mild-to-moderate cervical carotid atherosclerosis without significant stenosis. 4. Aortic Atherosclerosis   08-14-20: carotid doppler: Color duplex indicates minimal heterogeneous and calcified plaque, with no hemodynamically significant stenosis by duplex criteria in the extracranial cerebrovascular circulation.  08-17-20: ct of chest; abdomen and pelvis Chest Impression: 1. Scattered peribronchial thickening in LEFT and RIGHT lung suggest mild pulmonary inflammation or infection. 2. Focus of consolidation with pleural fluid at the RIGHT lung base differential include pneumonia or round atelectasis.   Abdomen / Pelvis Impression: 1. Circumferential submucosal thickening over 6 cm segment of the ascending colon. 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 Recommend endoscopy for further evaluation. 2. No evidence of metastatic adenopathy other than a small retrocrural lymph node which is favored unrelated 3. No liver metastasis. 4. Multiple diverticula bladder.  NO NEW EXAMS.    LABS REVIEWED PREVIOUS   08-13-20; wbc 7.5; hgb 9.4; hct 31.8; mcv 95.8 plt 270; glucose 122; bun 24; creat 1.28; k+ 3.9; na++ 135; ca 8.7 liver normal albumin 2.9 08-14-20; hgb a1c 4.7; chol 134; ldl 66; trig 96; hdl 49 08-17-20: wbc 7.7; hgb 9.4; hct 30.4; mcv 94.4 plt 242; glucose 111; bun 17; creat 1.16; k+ 3.7; na++ 136; ca 8.9 liver normal albumin  2.7  09-05-20: wbc 8.0; hgb 9.8; hct 31.5; mcv 94.6 plt 245; glucose 123; bun 22; creat 1.55; k+ 4.1; na++ 131; ca 8.5 GFR 21 liver normal albumin 2.9; iron 26; ferritin 222; vit B 12: >7500 09-17-20: tsh 1.637  11-09-20: wbc 4.4; hgb 8.7; hct 29.4; mcv 97.4 plt 180; glucose 99; bun 24; creat 1.16; k+ 4.1; na++ 134; ca 7.8; GFR 45; alk phos 136; albumin 2.2; CEA 8.1; iron 30; tibc 187; ferritin 120  12-29-20: glucose 83; bun 21; creat 0.88; k+ 4.5; na++ 138; ca 8.1; GFR >60 12/27/2020: wbc 6.3; hgb 9.8; hct 32.8; mcv 93.7 plt 209; glucose 112; bun 19; creat 1.04; k+ 3.3; na++ 138; ca 7.6; GFR 50; alk phos 131; albumin 2.3 01-23-21: wbc 4.7; hgb 9.9; hct 32.5; mcv 92.1 plt 182; iron 24; tibc 169; ferritin 111    NO NEW LABS.   Review of Systems  Unable to perform ROS: Dementia (unable to participate)    Physical Exam Constitutional:      General: She is not in acute distress.    Appearance: She is cachectic. She is not diaphoretic.  Neck:     Thyroid: No thyromegaly.  Cardiovascular:     Rate and Rhythm: Normal rate and regular rhythm.     Pulses: Normal pulses.     Heart sounds: Normal heart sounds.  Pulmonary:     Effort: Pulmonary effort is normal. No respiratory distress.     Breath sounds: Normal breath sounds.  Abdominal:     General: Bowel sounds are normal. There is no distension.     Palpations: Abdomen is soft.     Tenderness: There is no abdominal tenderness.  Musculoskeletal:     Cervical back: Neck supple.     Right lower leg: No edema.     Left lower leg: No edema.  Lymphadenopathy:     Cervical: No cervical adenopathy.   Skin:    General: Skin is warm and dry.  Neurological:     Mental Status: She is alert.     Comments: Little verbalization    ASSESSMENT/ PLAN:  TODAY   1. Vitamin B 12 deficiency: is stable level >7500 will monitor   2. 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 Aortic atherosclerosis (ct 08-17-20) will monitor  4. Chronic diastolic congestive heart failure: is stagle EF 60-65% (08-16-20) will continue to monitor her status.    PREVIOUS   5. Cerebral vascular accident (CVA) unspecified mechanism is neurologically stable will monitor  6. PAF (paraxysmal atrial fibrillation) heart rate is stable; status post pace maker   7. 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: is stable b/p 172/82  8. Pulmonary emphysema unspecified emphysema type: is stable will continue albuterol 2 puffs every 4 hours as needed; xopenex 1.25 mg neb every 4 hours as needed  9. Colon cancer of colon unspecified part of colon: due to her advanced age and dementia; no aggressive workup or treatment CEA 8.1   10. Gastroesophageal reflux disease without esophagitis: is stable will continue pepcid 20 mg daily    11. Alzheimer's disease without behavioral disturbance; unspecified age of onset: is losing weight her current weight is 89 pounds; unfortunately this is an expected outcome in the late stages of this disease.   12. Failure to thrive in adult/moderate protein calorie malnutrition: is without significant change albumin is 2.2; she continues  to lose weight.   13. Iron deficiency anemia due to chronic blood loss:  hgb 8.7 will continue niferex daily   14. Chronic constipation: is stable will continue colace twice daily   Ok Edwards NP [facility]  Contact [phone] Monday through Friday 8am- 5pm  After hours call [phone]

## 2021-02-18 DIAGNOSIS — G311 Senile degeneration of brain, not elsewhere classified: Secondary | ICD-10-CM | POA: Diagnosis not present

## 2021-02-18 DIAGNOSIS — Z950 Presence of cardiac pacemaker: Secondary | ICD-10-CM | POA: Diagnosis not present

## 2021-02-18 DIAGNOSIS — I509 Heart failure, unspecified: Secondary | ICD-10-CM | POA: Diagnosis not present

## 2021-02-18 DIAGNOSIS — J449 Chronic obstructive pulmonary disease, unspecified: Secondary | ICD-10-CM | POA: Diagnosis not present

## 2021-02-18 DIAGNOSIS — I4891 Unspecified atrial fibrillation: Secondary | ICD-10-CM | POA: Diagnosis not present

## 2021-02-18 DIAGNOSIS — N183 Chronic kidney disease, stage 3 unspecified: Secondary | ICD-10-CM | POA: Diagnosis not present

## 2021-02-20 DIAGNOSIS — E539 Vitamin B deficiency, unspecified: Secondary | ICD-10-CM | POA: Insufficient documentation

## 2021-02-21 DIAGNOSIS — G311 Senile degeneration of brain, not elsewhere classified: Secondary | ICD-10-CM | POA: Diagnosis not present

## 2021-02-21 DIAGNOSIS — Z950 Presence of cardiac pacemaker: Secondary | ICD-10-CM | POA: Diagnosis not present

## 2021-02-21 DIAGNOSIS — J449 Chronic obstructive pulmonary disease, unspecified: Secondary | ICD-10-CM | POA: Diagnosis not present

## 2021-02-21 DIAGNOSIS — N183 Chronic kidney disease, stage 3 unspecified: Secondary | ICD-10-CM | POA: Diagnosis not present

## 2021-02-21 DIAGNOSIS — I509 Heart failure, unspecified: Secondary | ICD-10-CM | POA: Diagnosis not present

## 2021-02-21 DIAGNOSIS — I4891 Unspecified atrial fibrillation: Secondary | ICD-10-CM | POA: Diagnosis not present

## 2021-02-23 DIAGNOSIS — N183 Chronic kidney disease, stage 3 unspecified: Secondary | ICD-10-CM | POA: Diagnosis not present

## 2021-02-23 DIAGNOSIS — N3946 Mixed incontinence: Secondary | ICD-10-CM | POA: Diagnosis not present

## 2021-02-23 DIAGNOSIS — I509 Heart failure, unspecified: Secondary | ICD-10-CM | POA: Diagnosis not present

## 2021-02-23 DIAGNOSIS — Z950 Presence of cardiac pacemaker: Secondary | ICD-10-CM | POA: Diagnosis not present

## 2021-02-23 DIAGNOSIS — R630 Anorexia: Secondary | ICD-10-CM | POA: Diagnosis not present

## 2021-02-23 DIAGNOSIS — I639 Cerebral infarction, unspecified: Secondary | ICD-10-CM | POA: Diagnosis not present

## 2021-02-23 DIAGNOSIS — G311 Senile degeneration of brain, not elsewhere classified: Secondary | ICD-10-CM | POA: Diagnosis not present

## 2021-02-23 DIAGNOSIS — I4891 Unspecified atrial fibrillation: Secondary | ICD-10-CM | POA: Diagnosis not present

## 2021-02-23 DIAGNOSIS — J449 Chronic obstructive pulmonary disease, unspecified: Secondary | ICD-10-CM | POA: Diagnosis not present

## 2021-02-24 ENCOUNTER — Other Ambulatory Visit: Payer: Self-pay | Admitting: Adult Health

## 2021-02-24 MED ORDER — LORAZEPAM 2 MG/ML PO CONC
0.5000 mg | Freq: Three times a day (TID) | ORAL | 0 refills | Status: DC | PRN
Start: 2021-02-24 — End: 2021-05-04

## 2021-02-25 DIAGNOSIS — N183 Chronic kidney disease, stage 3 unspecified: Secondary | ICD-10-CM | POA: Diagnosis not present

## 2021-02-25 DIAGNOSIS — G311 Senile degeneration of brain, not elsewhere classified: Secondary | ICD-10-CM | POA: Diagnosis not present

## 2021-02-25 DIAGNOSIS — I509 Heart failure, unspecified: Secondary | ICD-10-CM | POA: Diagnosis not present

## 2021-02-25 DIAGNOSIS — J449 Chronic obstructive pulmonary disease, unspecified: Secondary | ICD-10-CM | POA: Diagnosis not present

## 2021-02-25 DIAGNOSIS — I4891 Unspecified atrial fibrillation: Secondary | ICD-10-CM | POA: Diagnosis not present

## 2021-02-25 DIAGNOSIS — Z950 Presence of cardiac pacemaker: Secondary | ICD-10-CM | POA: Diagnosis not present

## 2021-03-01 DIAGNOSIS — I4891 Unspecified atrial fibrillation: Secondary | ICD-10-CM | POA: Diagnosis not present

## 2021-03-01 DIAGNOSIS — I509 Heart failure, unspecified: Secondary | ICD-10-CM | POA: Diagnosis not present

## 2021-03-01 DIAGNOSIS — G311 Senile degeneration of brain, not elsewhere classified: Secondary | ICD-10-CM | POA: Diagnosis not present

## 2021-03-01 DIAGNOSIS — N183 Chronic kidney disease, stage 3 unspecified: Secondary | ICD-10-CM | POA: Diagnosis not present

## 2021-03-01 DIAGNOSIS — Z950 Presence of cardiac pacemaker: Secondary | ICD-10-CM | POA: Diagnosis not present

## 2021-03-01 DIAGNOSIS — J449 Chronic obstructive pulmonary disease, unspecified: Secondary | ICD-10-CM | POA: Diagnosis not present

## 2021-03-04 DIAGNOSIS — J449 Chronic obstructive pulmonary disease, unspecified: Secondary | ICD-10-CM | POA: Diagnosis not present

## 2021-03-04 DIAGNOSIS — G311 Senile degeneration of brain, not elsewhere classified: Secondary | ICD-10-CM | POA: Diagnosis not present

## 2021-03-04 DIAGNOSIS — Z950 Presence of cardiac pacemaker: Secondary | ICD-10-CM | POA: Diagnosis not present

## 2021-03-04 DIAGNOSIS — I509 Heart failure, unspecified: Secondary | ICD-10-CM | POA: Diagnosis not present

## 2021-03-04 DIAGNOSIS — I4891 Unspecified atrial fibrillation: Secondary | ICD-10-CM | POA: Diagnosis not present

## 2021-03-04 DIAGNOSIS — N183 Chronic kidney disease, stage 3 unspecified: Secondary | ICD-10-CM | POA: Diagnosis not present

## 2021-03-06 ENCOUNTER — Telehealth: Payer: Self-pay

## 2021-03-06 ENCOUNTER — Encounter (HOSPITAL_COMMUNITY): Payer: Self-pay | Admitting: Hematology

## 2021-03-06 ENCOUNTER — Telehealth (HOSPITAL_COMMUNITY): Payer: Medicare Other | Admitting: Hematology

## 2021-03-06 NOTE — Telephone Encounter (Signed)
Last in-clinic check 10/25/20, underlying rhythm AF.  Noted to not be dependant.    Future remote checks have been cancelled, forwarding to MD for approval to unplug remote monitor and remove from [facility].

## 2021-03-06 NOTE — Telephone Encounter (Signed)
Patient daughter called in to let us know she is in hospice care and they advise for the patient to no longer have remotes. I let patient know I will send to nurse to let them know. I will cancel appointments for now unless instructed to put back on schedule

## 2021-03-06 NOTE — Telephone Encounter (Signed)
Spoke with patient daughter Quita Skye.  Confirmed that they do wish to discontinue monitoring.  Advised to unplug monitor, patient enrollment in [facility] cancelled, return kit ordered.

## 2021-03-08 DIAGNOSIS — N183 Chronic kidney disease, stage 3 unspecified: Secondary | ICD-10-CM | POA: Diagnosis not present

## 2021-03-08 DIAGNOSIS — G311 Senile degeneration of brain, not elsewhere classified: Secondary | ICD-10-CM | POA: Diagnosis not present

## 2021-03-08 DIAGNOSIS — J449 Chronic obstructive pulmonary disease, unspecified: Secondary | ICD-10-CM | POA: Diagnosis not present

## 2021-03-08 DIAGNOSIS — I4891 Unspecified atrial fibrillation: Secondary | ICD-10-CM | POA: Diagnosis not present

## 2021-03-08 DIAGNOSIS — Z950 Presence of cardiac pacemaker: Secondary | ICD-10-CM | POA: Diagnosis not present

## 2021-03-08 DIAGNOSIS — I509 Heart failure, unspecified: Secondary | ICD-10-CM | POA: Diagnosis not present

## 2021-03-11 DIAGNOSIS — I509 Heart failure, unspecified: Secondary | ICD-10-CM | POA: Diagnosis not present

## 2021-03-11 DIAGNOSIS — J449 Chronic obstructive pulmonary disease, unspecified: Secondary | ICD-10-CM | POA: Diagnosis not present

## 2021-03-11 DIAGNOSIS — G311 Senile degeneration of brain, not elsewhere classified: Secondary | ICD-10-CM | POA: Diagnosis not present

## 2021-03-11 DIAGNOSIS — I4891 Unspecified atrial fibrillation: Secondary | ICD-10-CM | POA: Diagnosis not present

## 2021-03-11 DIAGNOSIS — N183 Chronic kidney disease, stage 3 unspecified: Secondary | ICD-10-CM | POA: Diagnosis not present

## 2021-03-11 DIAGNOSIS — Z950 Presence of cardiac pacemaker: Secondary | ICD-10-CM | POA: Diagnosis not present

## 2021-03-15 DIAGNOSIS — G311 Senile degeneration of brain, not elsewhere classified: Secondary | ICD-10-CM | POA: Diagnosis not present

## 2021-03-15 DIAGNOSIS — I4891 Unspecified atrial fibrillation: Secondary | ICD-10-CM | POA: Diagnosis not present

## 2021-03-15 DIAGNOSIS — Z950 Presence of cardiac pacemaker: Secondary | ICD-10-CM | POA: Diagnosis not present

## 2021-03-15 DIAGNOSIS — I509 Heart failure, unspecified: Secondary | ICD-10-CM | POA: Diagnosis not present

## 2021-03-15 DIAGNOSIS — N183 Chronic kidney disease, stage 3 unspecified: Secondary | ICD-10-CM | POA: Diagnosis not present

## 2021-03-15 DIAGNOSIS — J449 Chronic obstructive pulmonary disease, unspecified: Secondary | ICD-10-CM | POA: Diagnosis not present

## 2021-03-18 ENCOUNTER — Encounter: Payer: Self-pay | Admitting: Adult Health

## 2021-03-18 ENCOUNTER — Non-Acute Institutional Stay (SKILLED_NURSING_FACILITY): Payer: Medicare Other | Admitting: Adult Health

## 2021-03-18 DIAGNOSIS — I5032 Chronic diastolic (congestive) heart failure: Secondary | ICD-10-CM | POA: Diagnosis not present

## 2021-03-18 DIAGNOSIS — F0394 Unspecified dementia, unspecified severity, with anxiety: Secondary | ICD-10-CM

## 2021-03-18 DIAGNOSIS — F0391 Unspecified dementia with behavioral disturbance: Secondary | ICD-10-CM | POA: Diagnosis not present

## 2021-03-18 DIAGNOSIS — G311 Senile degeneration of brain, not elsewhere classified: Secondary | ICD-10-CM | POA: Diagnosis not present

## 2021-03-18 DIAGNOSIS — N183 Chronic kidney disease, stage 3 unspecified: Secondary | ICD-10-CM | POA: Diagnosis not present

## 2021-03-18 DIAGNOSIS — Z950 Presence of cardiac pacemaker: Secondary | ICD-10-CM | POA: Diagnosis not present

## 2021-03-18 DIAGNOSIS — J449 Chronic obstructive pulmonary disease, unspecified: Secondary | ICD-10-CM | POA: Diagnosis not present

## 2021-03-18 DIAGNOSIS — E539 Vitamin B deficiency, unspecified: Secondary | ICD-10-CM | POA: Diagnosis not present

## 2021-03-18 DIAGNOSIS — I509 Heart failure, unspecified: Secondary | ICD-10-CM | POA: Diagnosis not present

## 2021-03-18 DIAGNOSIS — I700 Atherosclerosis of aorta: Secondary | ICD-10-CM

## 2021-03-18 DIAGNOSIS — I4891 Unspecified atrial fibrillation: Secondary | ICD-10-CM | POA: Diagnosis not present

## 2021-03-18 NOTE — Progress Notes (Signed)
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 She continues to be followed by hospice care. There are no reports of uncontrolled pain; she continues to slowly lose weight. No reports of anxiety or agitation.   Past Medical History:  Diagnosis Date   AKI (acute kidney injury) ([place])    Anemia    Aortic stenosis    Status post TAVR 2017   Arthritis    Asthma    Atrial fibrillation (HCC)    AVM (arteriovenous malformation) of small bowel, acquired    CKD (chronic kidney disease) stage 3, GFR 30-59 ml/min (HCC)    Colon cancer (HCC)    Dementia (HCC)    Essential hypertension    GERD (gastroesophageal reflux disease)    GI bleed    Glaucoma    Hearing loss    History of pneumonia    Pacemaker    [employer]   Recurrent UTI    Stroke [facility])    SVT (supraventricular tachycardia) (HCC)    S/P ablation of AVNRT in 2003    Past Surgical History:  Procedure Laterality Date   APPENDECTOMY     BIOPSY  04/07/2018   Procedure: BIOPSY;  Surgeon: Jerene Bears, MD;  Location: [place];  Service: Gastroenterology;;   BIOPSY  02/19/2020   Procedure: BIOPSY;   Surgeon: Harvel Quale, MD;  [facility];  Service: Gastroenterology;;  gastric    BLADDER SURGERY     CARDIAC CATHETERIZATION N/A 09/26/2015   Procedure: Right/Left Heart Cath and Coronary Angiography;  Surgeon: Burnell Blanks, MD;  Location: [place] [facility];  Service: Cardiovascular;  Laterality: N/A;   CARDIAC SURGERY     CARDIOVERSION N/A 03/02/2016   Procedure: CARDIOVERSION;  Surgeon: Thayer Headings, MD;  Location: [place];  Service: Cardiovascular;  Laterality: N/A;   ENTEROSCOPY  02/19/2020   Procedure: ENTEROSCOPY;  Surgeon: Harvel Quale, MD;  [facility];  Service: Gastroenterology;;   EP IMPLANTABLE DEVICE N/A 10/26/2015   Procedure: Pacemaker Implant;  Surgeon: Will Meredith Leeds, MD;  Location: [place] [facility];  Service: Cardiovascular;  Laterality: N/A;   ESOPHAGOGASTRODUODENOSCOPY (EGD) WITH PROPOFOL N/A 04/07/2018   Procedure: ESOPHAGOGASTRODUODENOSCOPY (EGD) WITH PROPOFOL;  Surgeon: Jerene Bears, MD;  Location: [facility] ENDOSCOPY;  Service: Gastroenterology;  Laterality: N/A;   ESOPHAGOGASTRODUODENOSCOPY (EGD) WITH PROPOFOL N/A 10/12/2019   Procedure: ESOPHAGOGASTRODUODENOSCOPY (EGD) WITH PROPOFOL;  Surgeon: Danie Binder, MD;  [facility];  Service: Endoscopy;  Laterality: N/A;   EYE SURGERY  Bilateral    cataract surgery   FLEXIBLE SIGMOIDOSCOPY  02/19/2020   Procedure: FLEXIBLE SIGMOIDOSCOPY;  Surgeon: Harvel Quale, MD;  [facility];  Service: Gastroenterology;;   HIP FRACTURE SURGERY Left 02/2020   HOT HEMOSTASIS N/A 04/07/2018   Procedure: HOT HEMOSTASIS (ARGON PLASMA COAGULATION/BICAP);  Surgeon: Jerene Bears, MD;  Location: [facility] ENDOSCOPY;  Service: Gastroenterology;  Laterality: N/A;   HOT HEMOSTASIS  10/12/2019   Procedure: HOT HEMOSTASIS (ARGON PLASMA COAGULATION/BICAP);  Surgeon: Danie Binder, MD;  [facility];  Service: Endoscopy;;   INTRAMEDULLARY (IM) NAIL  INTERTROCHANTERIC Left 03/10/2020   Procedure: OPEN TREATMENT INTERNAL FIXATION LEFT HIP (WITH GAMMA NAIL);  Surgeon: Carole Civil, MD;  Location: [facility];  Service: Orthopedics;  Laterality: Left;   NASAL SINUS SURGERY     PACEMAKER INSERTION     TEE WITHOUT CARDIOVERSION N/A 10/25/2015   Procedure: TRANSESOPHAGEAL ECHOCARDIOGRAM (TEE);  Surgeon: Burnell Blanks, MD;  Location: [place];  Service: Open Heart Surgery;  Laterality: N/A;   TRANSCATHETER AORTIC VALVE REPLACEMENT, TRANSFEMORAL Right 10/25/2015   Procedure: TRANSCATHETER AORTIC VALVE REPLACEMENT, TRANSFEMORAL;  Surgeon: Burnell Blanks, MD;  Location: [place];  Service: Open Heart Surgery;  Laterality: Right;    Social History   Socioeconomic History   Marital status: Widowed    Spouse name: Not on file   Number of children: 3   Years of education: Not on file   Highest education level: 12th grade  Occupational History   Occupation: Retired  Tobacco Use   Smoking status: Never   Smokeless tobacco: Never  Vaping Use   Vaping Use: Never used  Substance and Sexual Activity   Alcohol use: No    Alcohol/week: 0.0 standard drinks   Drug use: No   Sexual activity: Not Currently  Other Topics Concern   Not on file  Social History Narrative   Long term resident of [facility]    Social Determinants of Health   Financial Resource Strain: Not on file  Food Insecurity: Not on file  Transportation Needs: Not on file  Physical Activity: Not on file  Stress: Not on file  Social Connections: Not on file  Intimate Partner Violence: Not on file   Family History  Problem Relation Age of Onset   Hypertension Mother    Pneumonia Father    Stomach cancer Brother    Stroke Brother    AAA (abdominal aortic aneurysm) Brother    Cervical cancer Daughter    Heart attack Neg Hx    Colon cancer Neg Hx    Esophageal cancer Neg Hx       VITAL SIGNS BP 123/78   Pulse 89   Temp 97.6 F (36.4 C)   Ht 5' 1"  (1.549 m)   Wt  89 lb 3.2 oz (40.5 kg)   BMI 16.85 kg/m   Outpatient Encounter Medications as of 03/18/2021  Medication Sig   acetaminophen (TYLENOL) 325 MG tablet Take 2 tablets (650 mg total) by mouth every 4 (four) hours as needed for mild pain (or temp > 37.5 C (99.5 F)).   albuterol (PROAIR HFA) 108 (90 Base) MCG/ACT inhaler Inhale 2 puffs into the lungs every 4 (four) hours as needed for wheezing or shortness of breath.   Balsam [place]-Castor Oil OINT Apply 1 application topically daily. Apply to sacrum and bilateral buttocks every shift   busPIRone (BUSPAR) 5 MG tablet Take 5 mg by mouth 3 (three) times daily.   docusate (COLACE)  50 MG/5ML liquid Take 100 mg by mouth 2 (two) times daily.   famotidine (PEPCID) 20 MG tablet Take 1 tablet (20 mg total) by mouth at bedtime.   levalbuterol (XOPENEX) 1.25 MG/3ML nebulizer solution Take 1.25 mg by nebulization every 4 (four) hours as needed for wheezing. Dx J45.909   LORazepam (LORAZEPAM INTENSOL) 2 MG/ML concentrated solution Take 0.3 mLs (0.6 mg total) by mouth every 8 (eight) hours as needed for anxiety.   morphine (ROXANOL) 20 MG/ML concentrated solution Take 0.25 mLs (5 mg total) by mouth every 4 (four) hours as needed for severe pain.   NON FORMULARY Regular diet   Nutritional Supplements (ENSURE CLEAR) LIQD Take 1 Bottle by mouth daily.   ondansetron (ZOFRAN-ODT) 4 MG disintegrating tablet Take 4 mg by mouth every 6 (six) hours as needed for nausea or vomiting.   No facility-administered encounter medications on file as of 03/18/2021.     SIGNIFICANT DIAGNOSTIC EXAMS   PREVIOUS   08-13-20: ct head:  No acute abnormality. Atrophy and chronic microvascular ischemic disease.  08-13-20: ct angio of head and neck:  1. 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 2. 26 mL ischemic penumbra in the left MCA territory without evidence of a core infarct. 3. Mild-to-moderate cervical carotid atherosclerosis without significant stenosis. 4. Aortic  Atherosclerosis   08-14-20: carotid doppler: Color duplex indicates minimal heterogeneous and calcified plaque, with no hemodynamically significant stenosis by duplex criteria in the extracranial cerebrovascular circulation.  08-17-20: ct of chest; abdomen and pelvis Chest Impression: 1. Scattered peribronchial thickening in LEFT and RIGHT lung suggest mild pulmonary inflammation or infection. 2. Focus of consolidation with pleural fluid at the RIGHT lung base differential include pneumonia or round atelectasis.   Abdomen / Pelvis Impression: 1. Circumferential submucosal thickening over 6 cm segment of the ascending colon. 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 Recommend endoscopy for further evaluation. 2. No evidence of metastatic adenopathy other than a small retrocrural lymph node which is favored unrelated 3. No liver metastasis. 4. Multiple diverticula bladder.  NO NEW EXAMS.   LABS REVIEWED PREVIOUS   08-13-20; wbc 7.5; hgb 9.4; hct 31.8; mcv 95.8 plt 270; glucose 122; bun 24; creat 1.28; k+ 3.9; na++ 135; ca 8.7 liver normal albumin 2.9 08-14-20; hgb a1c 4.7; chol 134; ldl 66; trig 96; hdl 49 08-17-20: wbc 7.7; hgb 9.4; hct 30.4; mcv 94.4 plt 242; glucose 111; bun 17; creat 1.16; k+ 3.7; na++ 136; ca 8.9 liver normal albumin 2.7  09-05-20: wbc 8.0; hgb 9.8; hct 31.5; mcv 94.6 plt 245; glucose 123; bun 22; creat 1.55; k+ 4.1; na++ 131; ca 8.5 GFR 21 liver normal albumin 2.9; iron 26; ferritin 222; vit B 12: >7500 09-17-20: tsh 1.637  11-09-20: wbc 4.4; hgb 8.7; hct 29.4; mcv 97.4 plt 180; glucose 99; bun 24; creat 1.16; k+ 4.1; na++ 134; ca 7.8; GFR 45; alk phos 136; albumin 2.2; CEA 8.1; iron 30; tibc 187; ferritin 120   NO NEW LABS.   Review of Systems  Unable to perform ROS: Dementia (unable to participate)   Physical Exam Constitutional:      General: She is not in acute distress.    Appearance: She is cachectic. She is not diaphoretic.  Neck:     Thyroid: No  thyromegaly.  Cardiovascular:     Rate and Rhythm: Normal rate and regular rhythm.     Heart sounds: Normal heart sounds.  Pulmonary:     Effort: Pulmonary effort is normal. No respiratory distress.  Breath sounds: Normal breath sounds.  Abdominal:     General: Bowel sounds are normal. There is no distension.     Palpations: Abdomen is soft.     Tenderness: There is no abdominal tenderness.  Musculoskeletal:     Cervical back: Neck supple.     Right lower leg: No edema.     Left lower leg: No edema.  Lymphadenopathy:     Cervical: No cervical adenopathy.  Skin:    General: Skin is warm and dry.  Neurological:     Comments: Is aware of surroundings; little verbalization       ASSESSMENT/ PLAN:  TODAY  Vitamin B 12 deficiency: is stable level >7500 will monitor   2. 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 Aortic atherosclerosis: (ct 08-17-20) will monitor   4. Chronic diastolic congestive heart failure: is stable EF 60-65% (08-16-20) will continue lasix 40 mg daily with k+ 20 meq daily    PREVIOUS   5. Cerebral vascular accident (CVA) unspecified mechanism is neurologically stable will monitor  6. PAF (paraxysmal atrial fibrillation) heart rate is stable; status post pace maker will continue lopressor 12.5 mg twice daily for rate control   7. 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: is stable b/p 123/78 will continue lopressor 12.5 mg twice daily   8. Pulmonary emphysema unspecified emphysema type: is stable will continue breztri aerosphere 160-4.8 mcg 2 puffs twice daily albuterol 2 puffs every 4 hours as needed; xopenex 1.25 mg neb every 4 hours as needed  9. Colon cancer of colon unspecified part of colon: due to her advanced age and dementia; no aggressive workup or treatment CEA 8.1   10. Gastroesophageal reflux disease without  esophagitis: is stable will continue protonix 40 mg twice daily   11. Alzheimer's disease without behavioral disturbance; unspecified age of onset: is losing weight her current weight is 89 pounds; unfortunately this is an expected outcome int he late stages of this disease.   12. Failure to thrive in adult/moderate protein calorie malnutrition: is without significant change albumin is 2.2; she continues to lose weight.   13. Iron deficiency anemia due to chronic blood loss:  hgb 8.7 will continue niferex daily   14. Chronic constipation: is stable will continue colace twice daily      Ok Edwards NP [facility]  Contact [phone] Monday through Friday 8am- 5pm  After hours call [phone]

## 2021-03-22 DIAGNOSIS — I4891 Unspecified atrial fibrillation: Secondary | ICD-10-CM | POA: Diagnosis not present

## 2021-03-22 DIAGNOSIS — J449 Chronic obstructive pulmonary disease, unspecified: Secondary | ICD-10-CM | POA: Diagnosis not present

## 2021-03-22 DIAGNOSIS — I509 Heart failure, unspecified: Secondary | ICD-10-CM | POA: Diagnosis not present

## 2021-03-22 DIAGNOSIS — N183 Chronic kidney disease, stage 3 unspecified: Secondary | ICD-10-CM | POA: Diagnosis not present

## 2021-03-22 DIAGNOSIS — Z950 Presence of cardiac pacemaker: Secondary | ICD-10-CM | POA: Diagnosis not present

## 2021-03-22 DIAGNOSIS — G311 Senile degeneration of brain, not elsewhere classified: Secondary | ICD-10-CM | POA: Diagnosis not present

## 2021-03-25 DIAGNOSIS — I509 Heart failure, unspecified: Secondary | ICD-10-CM | POA: Diagnosis not present

## 2021-03-25 DIAGNOSIS — Z950 Presence of cardiac pacemaker: Secondary | ICD-10-CM | POA: Diagnosis not present

## 2021-03-25 DIAGNOSIS — N183 Chronic kidney disease, stage 3 unspecified: Secondary | ICD-10-CM | POA: Diagnosis not present

## 2021-03-25 DIAGNOSIS — J449 Chronic obstructive pulmonary disease, unspecified: Secondary | ICD-10-CM | POA: Diagnosis not present

## 2021-03-25 DIAGNOSIS — R630 Anorexia: Secondary | ICD-10-CM | POA: Diagnosis not present

## 2021-03-25 DIAGNOSIS — N3946 Mixed incontinence: Secondary | ICD-10-CM | POA: Diagnosis not present

## 2021-03-25 DIAGNOSIS — G311 Senile degeneration of brain, not elsewhere classified: Secondary | ICD-10-CM | POA: Diagnosis not present

## 2021-03-25 DIAGNOSIS — I4891 Unspecified atrial fibrillation: Secondary | ICD-10-CM | POA: Diagnosis not present

## 2021-03-25 DIAGNOSIS — I639 Cerebral infarction, unspecified: Secondary | ICD-10-CM | POA: Diagnosis not present

## 2021-03-26 IMAGING — CT CT ABD-PELV W/ CM
2 of 5 series · 12 of 36 positions shown, 15 images · IV contrast (Omnipaque or Isovue)
Comparison: CT chest 07/06/2016

CLINICAL DATA: Abdominal distension. Suspicion of diverticulitis.
Melena. Appendectomy and bladder surgery

EXAM:
CT CHEST, ABDOMEN, AND PELVIS WITH CONTRAST
TECHNIQUE: Multidetector CT imaging of the chest, abdomen and pelvis was
performed following the standard protocol during bolus
administration of intravenous contrast.
CONTRAST:  75mL OMNIPAQUE IOHEXOL 300 MG/ML  SOLN

[Series 2: cap with · axial · 0.79mm/px · z∈[+829,+1314]mm · 9 of 123 slices shown, 12 images]
[im 13/123  mediastinal]
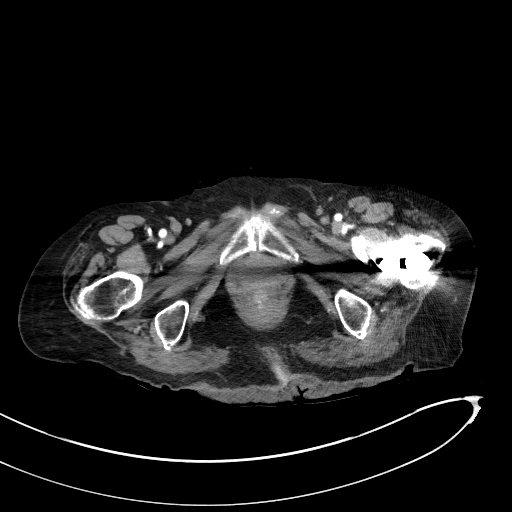
[im 13/123  lung]
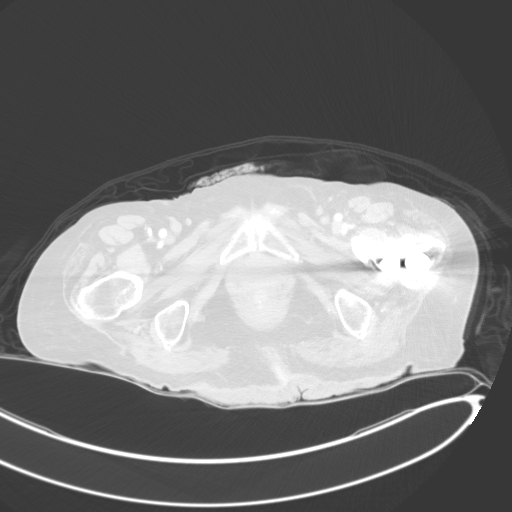
[im 25/123  lung]
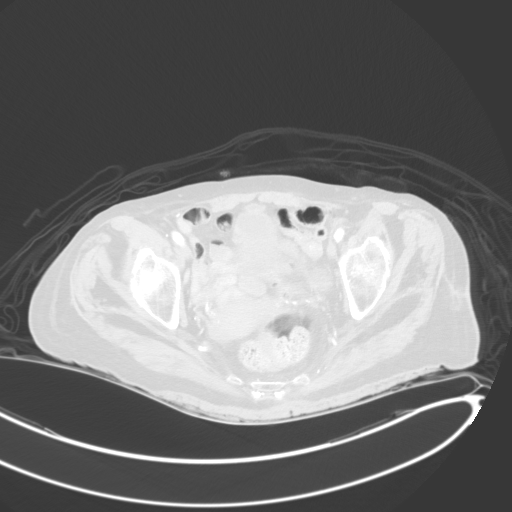
[im 37/123  lung]
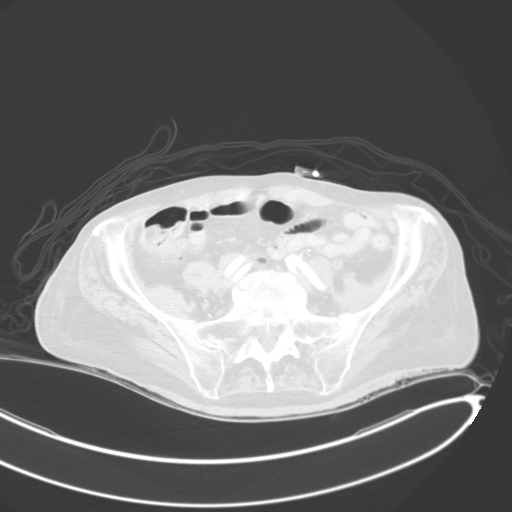
[im 49/123  lung]
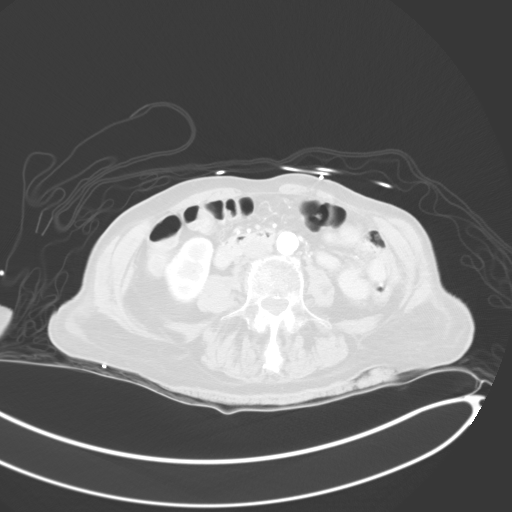
[im 62/123  mediastinal]
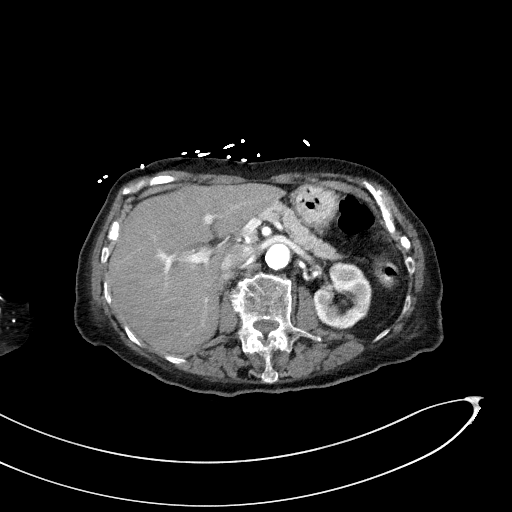
[im 62/123  lung]
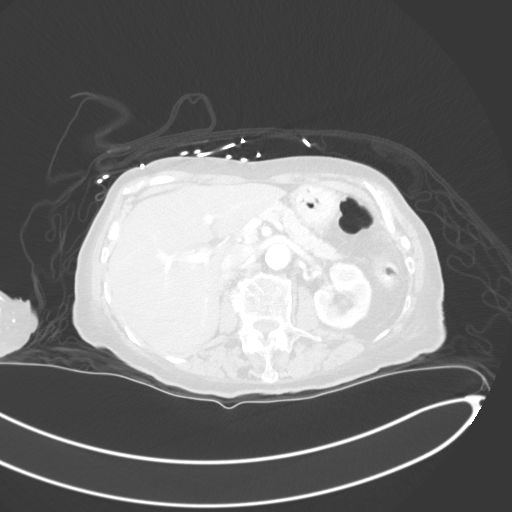
[im 74/123  lung]
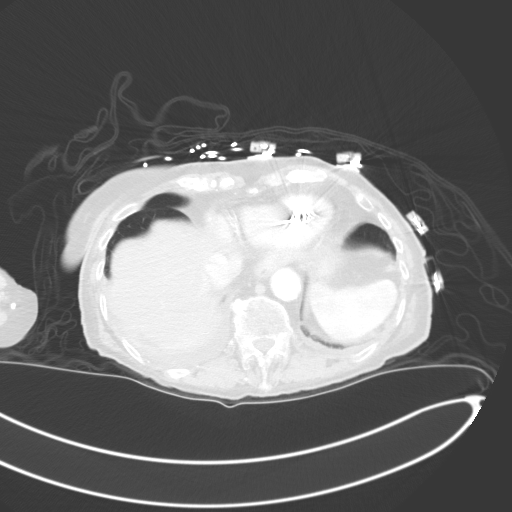
[im 86/123  lung]
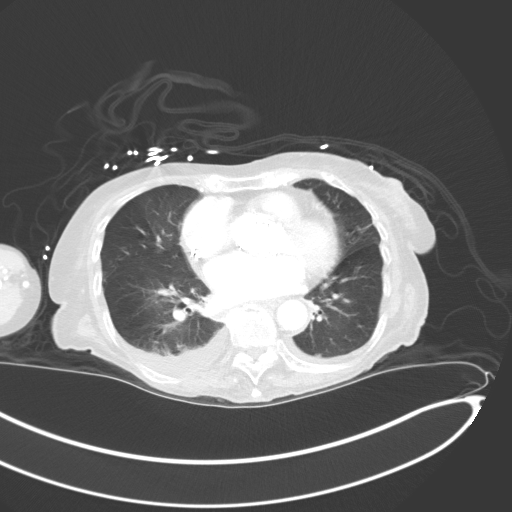
[im 98/123  lung]
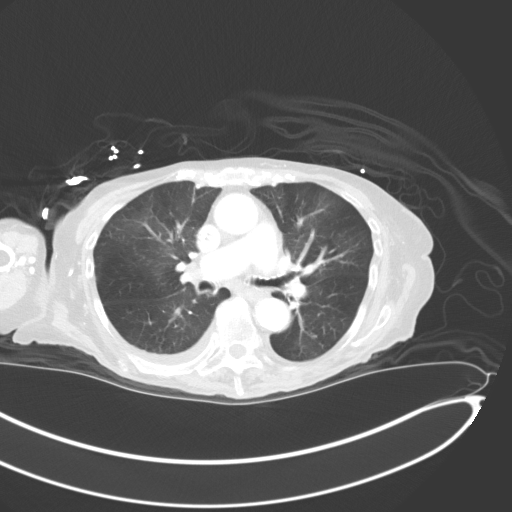
[im 110/123  mediastinal]
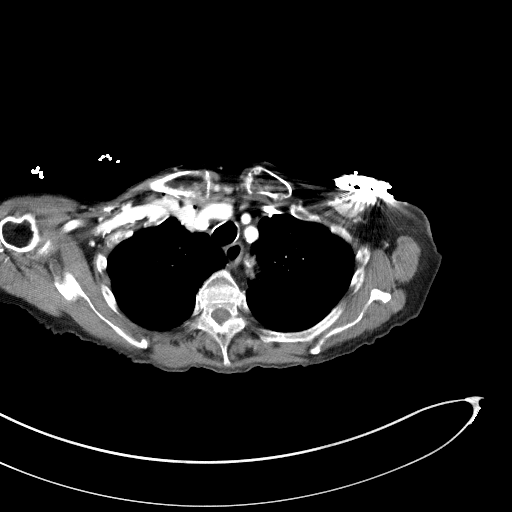
[im 110/123  lung]
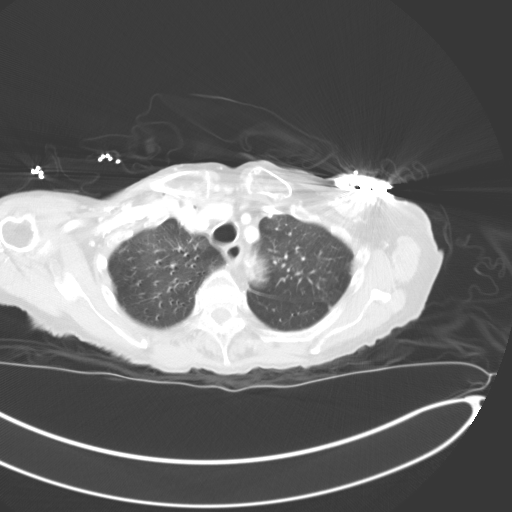

[Series 4: coronals · coronal · 0.72mm/px · 3 of 135 slices shown]
[im 27/135  lung]
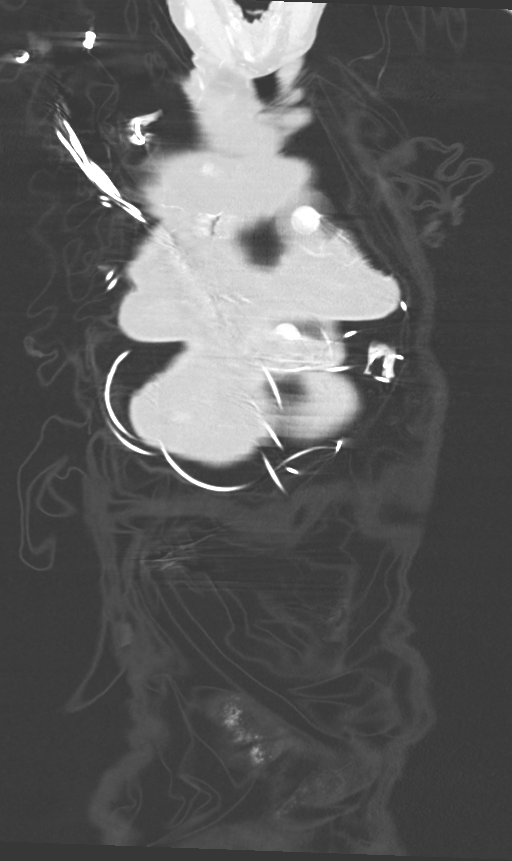
[im 54/135  lung]
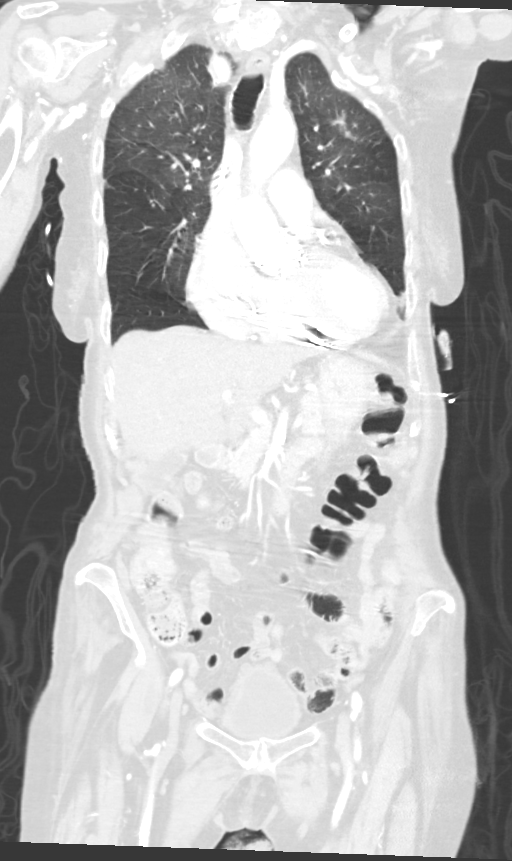
[im 81/135  lung]
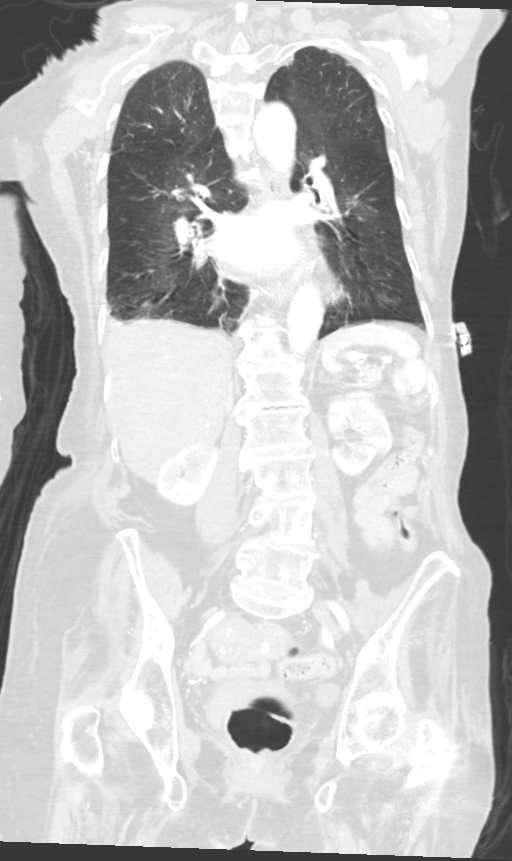

[12 of 36 positions shown; findings below may reference images not displayed]

FINDINGS: CT CHEST FINDINGS

Cardiovascular: Aortic valve repair. Pacemaker noted. Coronary
artery calcification and aortic atherosclerotic calcification.

Mediastinum/Nodes: No axillary supraclavicular adenopathy. No
mediastinal or hilar adenopathy. No pericardial fluid.

Lungs/Pleura: Several foci of peribronchial thickening scattered
throughout the LEFT and RIGHT lungs. For example in the RIGHT upper
lobe on image 59/3. LEFT upper lobe image 47/3.

Small RIGHT effusion. Small focus of consolidation at the RIGHT lung
base representing atelectasis or potentially pneumonia (image
115/3).

Musculoskeletal: No aggressive osseous lesion.

CT ABDOMEN AND PELVIS FINDINGS

Hepatobiliary: No focal hepatic lesion. No biliary ductal
dilatation. Gallbladder is normal. Common bile duct is normal.

Pancreas: Pancreas is normal. No ductal dilatation. No pancreatic
inflammation.

Spleen: Normal spleen

Adrenals/urinary tract: Adrenal glands and kidneys normal. Multiple
diverticula of the bladder.

Stomach/Bowel: Stomach, duodenum,and small-bowel normal. Terminal
ileum is normal. Within the ascending colon there is a 5.5 cm
segment of circumferential bowel wall submucosal thickening (axial
image 88/2 and coronal image 63/4). No evidence of obstruction
proximal to this the circumferential submucosal thickening. The
distal colon (transverse and descending colon) is normal. There is
stool in the rectosigmoid colon. Rectum normal.

Vascular/Lymphatic: Abdominal aorta normal caliber with intimal
calcifications. Small retrocrural lymph node measuring 7 mm on image
50/2). No additional abdominal or mesenteric adenopathy.

Reproductive: Uterus and adnexa unremarkable.

Other: No peritoneal nodularity.  No free fluid.

Musculoskeletal: Internal fixation of RIGHT hip fracture. No
aggressive osseous lesion
IMPRESSION: Chest Impression:

1. Scattered peribronchial thickening in LEFT and RIGHT lung suggest
mild pulmonary inflammation or infection.
2. Focus of consolidation with pleural fluid at the RIGHT lung base
differential include pneumonia or round atelectasis.

Abdomen / Pelvis Impression:

1. Circumferential submucosal thickening over 6 cm segment of the
ascending colon. Findings concerning for COLORECTAL CARCINOMA.
Recommend endoscopy for further evaluation.
2. No evidence of metastatic adenopathy other than a small
retrocrural lymph node which is favored unrelated
3. No liver metastasis.
4. Multiple diverticula bladder.

## 2021-03-26 IMAGING — CT CT CHEST W/ CM
2 of 5 series · 12 of 36 positions shown, 15 images · IV contrast (omnipaque)
Comparison: CT chest 07/06/2016

CLINICAL DATA: Abdominal distension. Suspicion of diverticulitis.
Melena. Appendectomy and bladder surgery

EXAM:
CT CHEST, ABDOMEN, AND PELVIS WITH CONTRAST
TECHNIQUE: Multidetector CT imaging of the chest, abdomen and pelvis was
performed following the standard protocol during bolus
administration of intravenous contrast.
CONTRAST:  75mL OMNIPAQUE IOHEXOL 300 MG/ML  SOLN

[Series 2: cap with · axial · 0.79mm/px · z∈[+829,+1314]mm · 9 of 123 slices shown, 12 images]
[im 13/123  mediastinal]
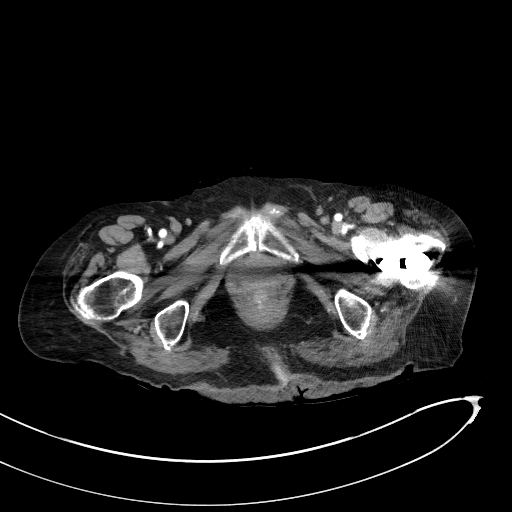
[im 13/123  lung]
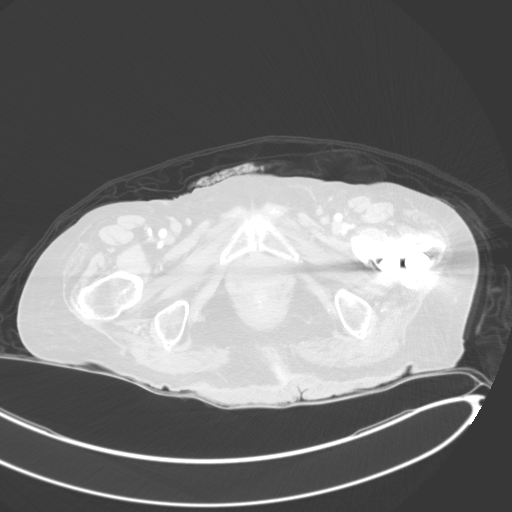
[im 25/123  lung]
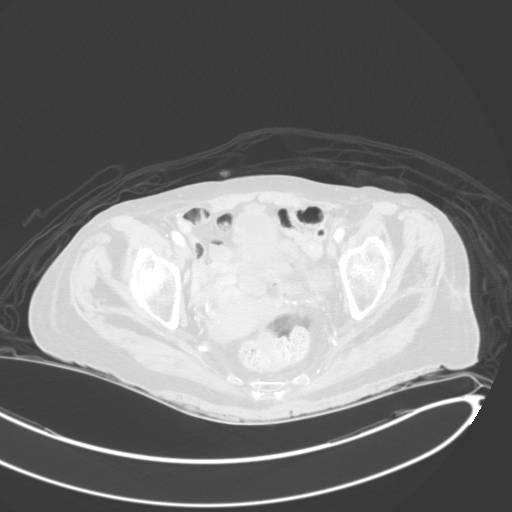
[im 37/123  lung]
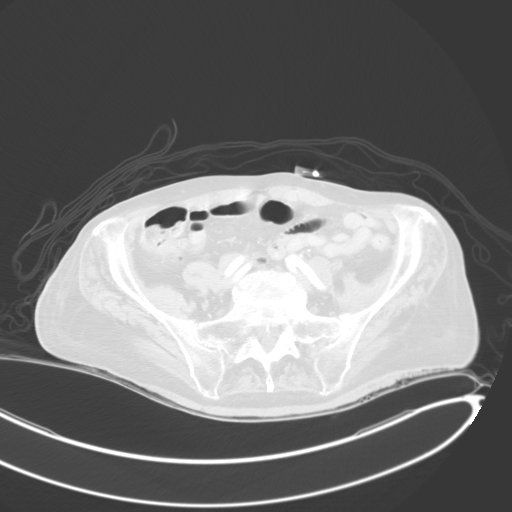
[im 49/123  lung]
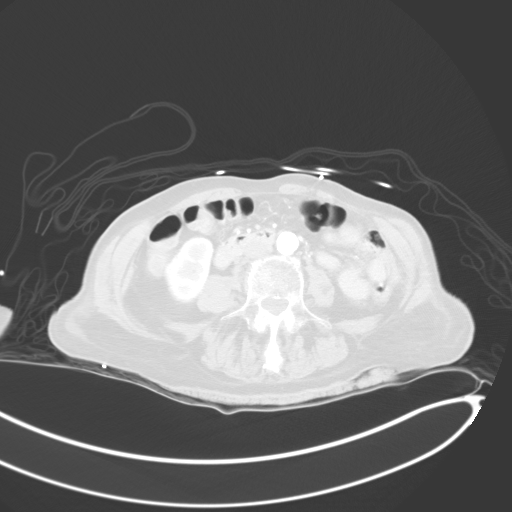
[im 62/123  mediastinal]
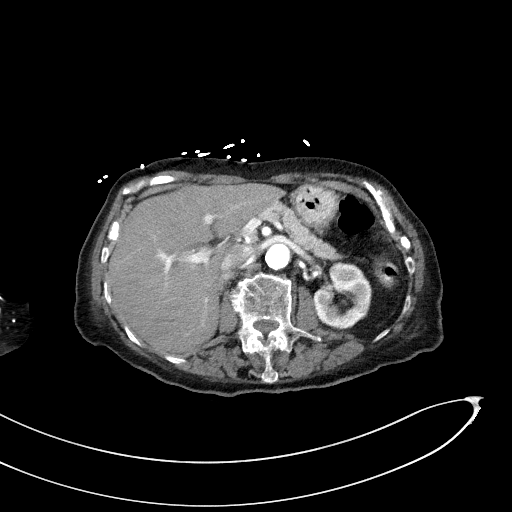
[im 62/123  lung]
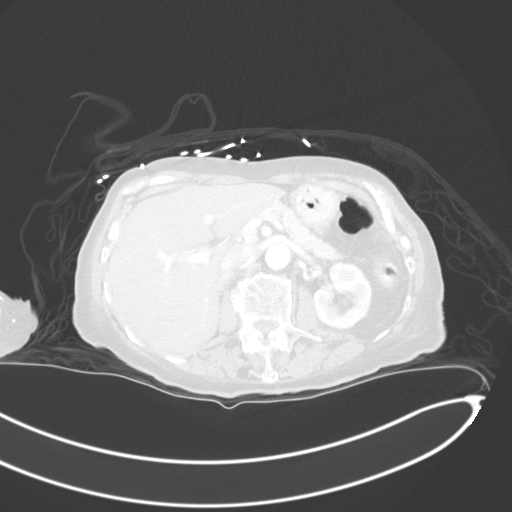
[im 74/123  lung]
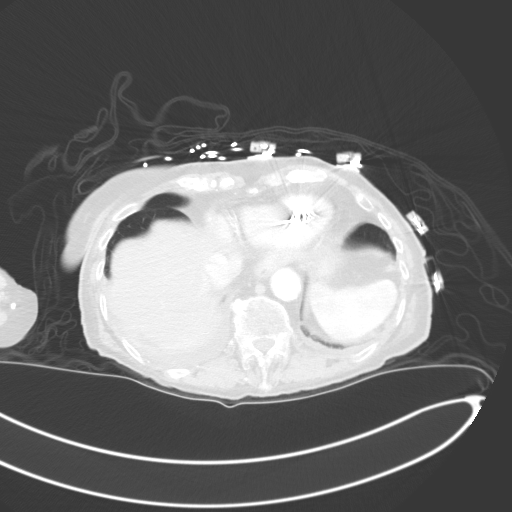
[im 86/123  lung]
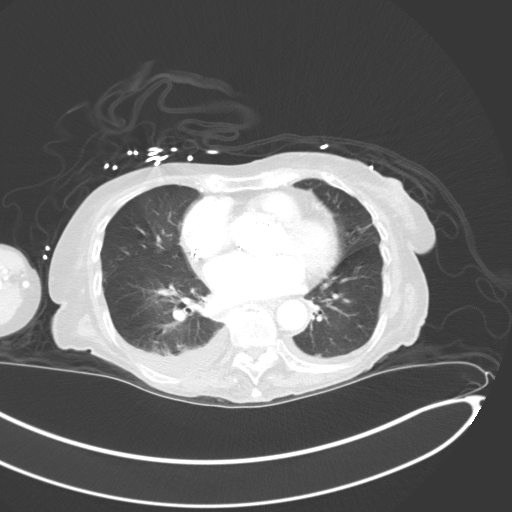
[im 98/123  lung]
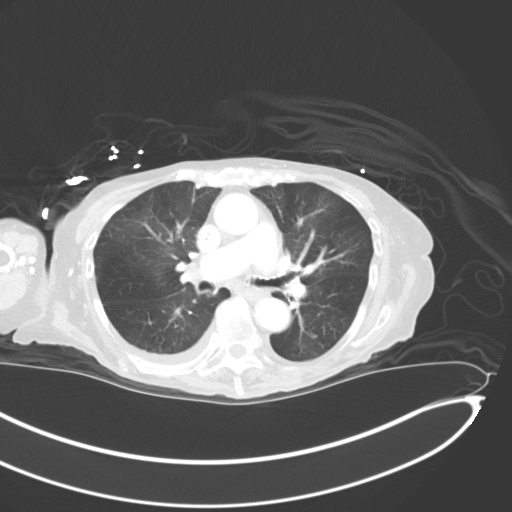
[im 110/123  mediastinal]
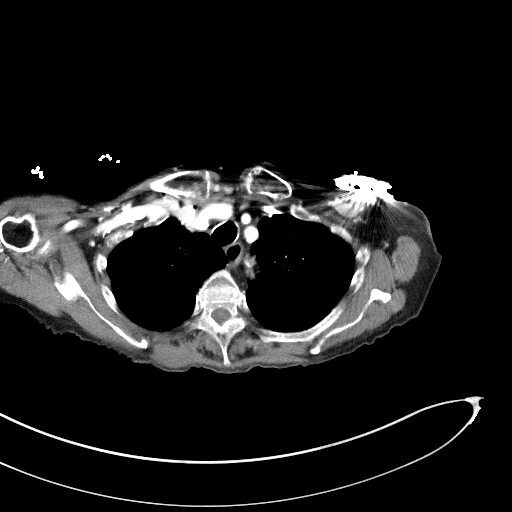
[im 110/123  lung]
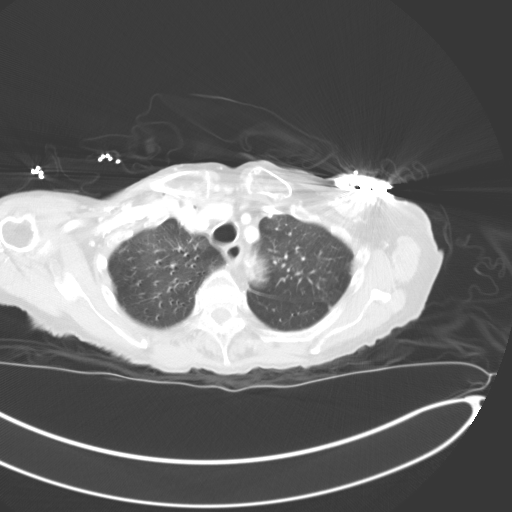

[Series 4: coronals · coronal · 0.72mm/px · 3 of 135 slices shown]
[im 27/135  lung]
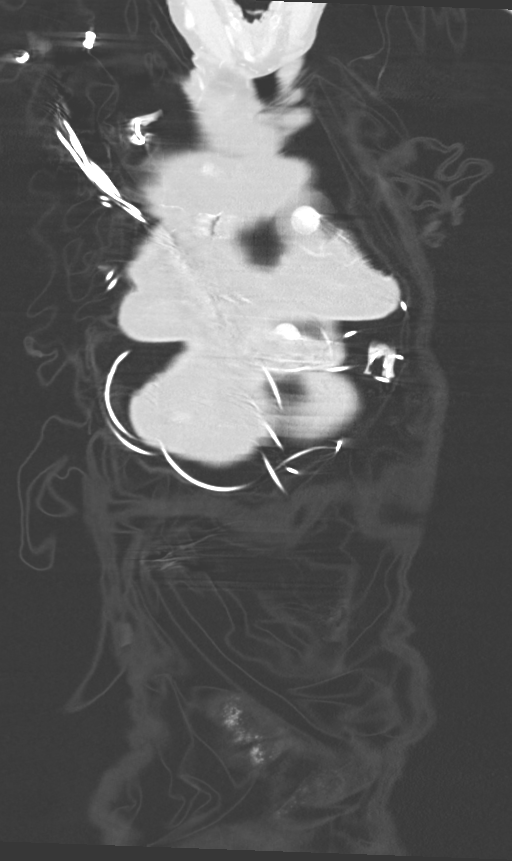
[im 54/135  lung]
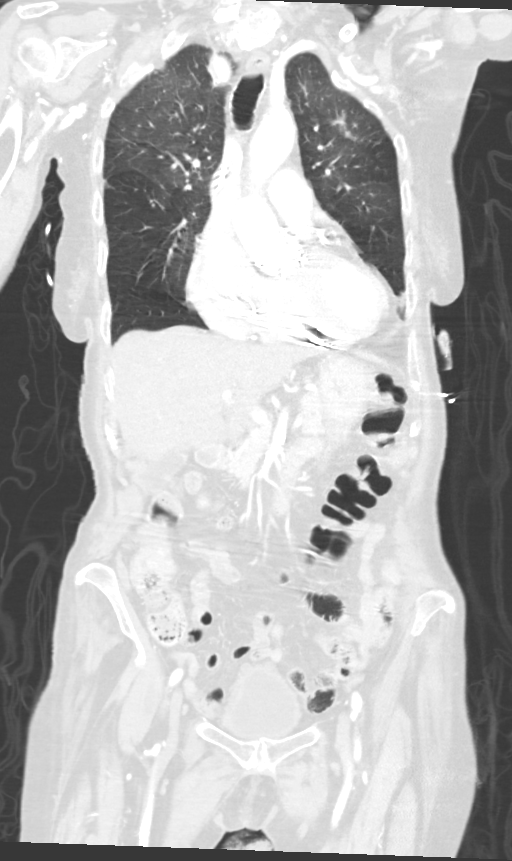
[im 81/135  lung]
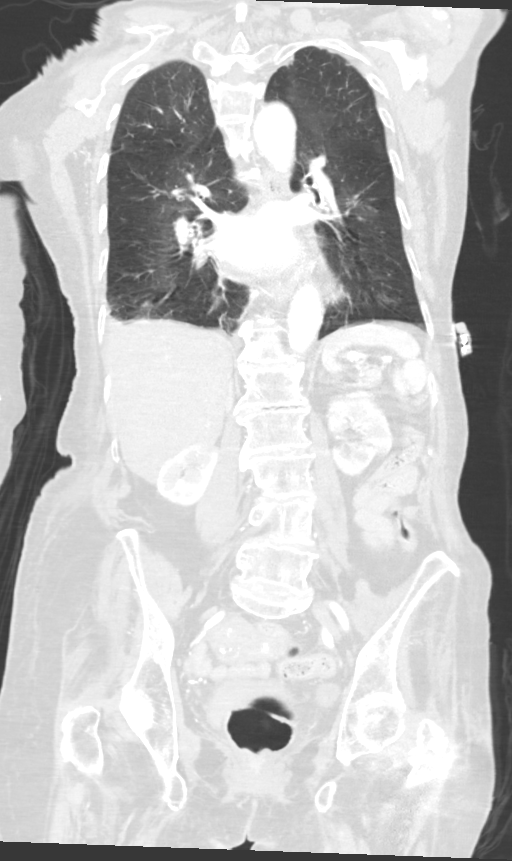

[12 of 36 positions shown; findings below may reference images not displayed]

FINDINGS: CT CHEST FINDINGS

Cardiovascular: Aortic valve repair. Pacemaker noted. Coronary
artery calcification and aortic atherosclerotic calcification.

Mediastinum/Nodes: No axillary supraclavicular adenopathy. No
mediastinal or hilar adenopathy. No pericardial fluid.

Lungs/Pleura: Several foci of peribronchial thickening scattered
throughout the LEFT and RIGHT lungs. For example in the RIGHT upper
lobe on image 59/3. LEFT upper lobe image 47/3.

Small RIGHT effusion. Small focus of consolidation at the RIGHT lung
base representing atelectasis or potentially pneumonia (image
115/3).

Musculoskeletal: No aggressive osseous lesion.

CT ABDOMEN AND PELVIS FINDINGS

Hepatobiliary: No focal hepatic lesion. No biliary ductal
dilatation. Gallbladder is normal. Common bile duct is normal.

Pancreas: Pancreas is normal. No ductal dilatation. No pancreatic
inflammation.

Spleen: Normal spleen

Adrenals/urinary tract: Adrenal glands and kidneys normal. Multiple
diverticula of the bladder.

Stomach/Bowel: Stomach, duodenum,and small-bowel normal. Terminal
ileum is normal. Within the ascending colon there is a 5.5 cm
segment of circumferential bowel wall submucosal thickening (axial
image 88/2 and coronal image 63/4). No evidence of obstruction
proximal to this the circumferential submucosal thickening. The
distal colon (transverse and descending colon) is normal. There is
stool in the rectosigmoid colon. Rectum normal.

Vascular/Lymphatic: Abdominal aorta normal caliber with intimal
calcifications. Small retrocrural lymph node measuring 7 mm on image
50/2). No additional abdominal or mesenteric adenopathy.

Reproductive: Uterus and adnexa unremarkable.

Other: No peritoneal nodularity.  No free fluid.

Musculoskeletal: Internal fixation of RIGHT hip fracture. No
aggressive osseous lesion
IMPRESSION: Chest Impression:

1. Scattered peribronchial thickening in LEFT and RIGHT lung suggest
mild pulmonary inflammation or infection.
2. Focus of consolidation with pleural fluid at the RIGHT lung base
differential include pneumonia or round atelectasis.

Abdomen / Pelvis Impression:

1. Circumferential submucosal thickening over 6 cm segment of the
ascending colon. Findings concerning for COLORECTAL CARCINOMA.
Recommend endoscopy for further evaluation.
2. No evidence of metastatic adenopathy other than a small
retrocrural lymph node which is favored unrelated
3. No liver metastasis.
4. Multiple diverticula bladder.

## 2021-03-29 DIAGNOSIS — Z23 Encounter for immunization: Secondary | ICD-10-CM | POA: Diagnosis not present

## 2021-03-29 DIAGNOSIS — I4891 Unspecified atrial fibrillation: Secondary | ICD-10-CM | POA: Diagnosis not present

## 2021-03-29 DIAGNOSIS — N183 Chronic kidney disease, stage 3 unspecified: Secondary | ICD-10-CM | POA: Diagnosis not present

## 2021-03-29 DIAGNOSIS — I509 Heart failure, unspecified: Secondary | ICD-10-CM | POA: Diagnosis not present

## 2021-03-29 DIAGNOSIS — G311 Senile degeneration of brain, not elsewhere classified: Secondary | ICD-10-CM | POA: Diagnosis not present

## 2021-03-29 DIAGNOSIS — Z950 Presence of cardiac pacemaker: Secondary | ICD-10-CM | POA: Diagnosis not present

## 2021-03-29 DIAGNOSIS — J449 Chronic obstructive pulmonary disease, unspecified: Secondary | ICD-10-CM | POA: Diagnosis not present

## 2021-04-01 DIAGNOSIS — I4891 Unspecified atrial fibrillation: Secondary | ICD-10-CM | POA: Diagnosis not present

## 2021-04-01 DIAGNOSIS — G311 Senile degeneration of brain, not elsewhere classified: Secondary | ICD-10-CM | POA: Diagnosis not present

## 2021-04-01 DIAGNOSIS — Z950 Presence of cardiac pacemaker: Secondary | ICD-10-CM | POA: Diagnosis not present

## 2021-04-01 DIAGNOSIS — J449 Chronic obstructive pulmonary disease, unspecified: Secondary | ICD-10-CM | POA: Diagnosis not present

## 2021-04-01 DIAGNOSIS — I509 Heart failure, unspecified: Secondary | ICD-10-CM | POA: Diagnosis not present

## 2021-04-01 DIAGNOSIS — N183 Chronic kidney disease, stage 3 unspecified: Secondary | ICD-10-CM | POA: Diagnosis not present

## 2021-04-04 ENCOUNTER — Non-Acute Institutional Stay (SKILLED_NURSING_FACILITY): Payer: Medicare Other | Admitting: Internal Medicine

## 2021-04-04 ENCOUNTER — Encounter: Payer: Self-pay | Admitting: Internal Medicine

## 2021-04-04 DIAGNOSIS — N1831 Chronic kidney disease, stage 3a: Secondary | ICD-10-CM

## 2021-04-04 DIAGNOSIS — I10 Essential (primary) hypertension: Secondary | ICD-10-CM

## 2021-04-04 DIAGNOSIS — J439 Emphysema, unspecified: Secondary | ICD-10-CM | POA: Diagnosis not present

## 2021-04-04 DIAGNOSIS — C189 Malignant neoplasm of colon, unspecified: Secondary | ICD-10-CM | POA: Diagnosis not present

## 2021-04-04 DIAGNOSIS — D500 Iron deficiency anemia secondary to blood loss (chronic): Secondary | ICD-10-CM | POA: Diagnosis not present

## 2021-04-04 DIAGNOSIS — R627 Adult failure to thrive: Secondary | ICD-10-CM

## 2021-04-04 NOTE — Progress Notes (Signed)
   NURSING HOME LOCATION:  [facility] ROOM NUMBER:  [id number]  CODE STATUS:  DNR  PCP: Ok Edwards NP   This is a nursing facility follow up visit of chronic medical diagnoses & to document compliance with Regulation 483.30 (c) in [employer] [place] Manual Phase 2 which mandates caregiver visit ( visits can alternate among physician, PA or NP as per statutes) within 10 days of 30 days / 60 days/ 90 days post admission to SNF date    Interim medical record and care since last SNF visit was updated with review of diagnostic studies and change in clinical status since last visit were documented.  HPI:She is a permanent resident of this facility with diagnoses of HTN, history of aortic stenosis, PAF, CKD, history of colon cancer, history of stroke, and history of dementia. Surgeries include transcatheter aortic valve replacement and pacemaker insertion.  Review of systems: She can provide no history.  I discussed her case with the staff and her daughter.  Both validate that she is eating and drinking better.  Apparently her dentures were ill fitting and were removed and she was placed on a pured diet with a subsequent improvement in intake.  Her daughter states that she intermittently seems to be dyspneic.  She does have a history of COPD as well as a history of asthma.  Physical exam:  Pertinent or positive findings: She appears chronically ill and malnourished.  Eyes are sunken.  She is edentulous.  She has dry rales diffusely .Slight tachycardia present. First and second heart sounds are increased.  Dorsalis pedis pulses are stronger than posterior tibial pulses.  Feet are in booties.  She has limb wasting and interosseous muscle wasting.  General appearance:  no acute distress, increased work of breathing is present.   Lymphatic: No lymphadenopathy about the head, neck, axilla. Eyes: No conjunctival inflammation or lid edema is present. There is no scleral  icterus. Ears:  External ear exam shows no significant lesions or deformities.   Nose:  External nasal examination shows no deformity or inflammation. Nasal mucosa are pink and moist without lesions, exudates Oral exam:  Lips and gums are healthy appearing. There is no oropharyngeal erythema or exudate. Neck:  No thyromegaly, masses, tenderness noted.    Heart:  No murmur, click, rub .  Lungs: without wheezes, rhonchi,  rubs. Abdomen: Bowel sounds are normal. Abdomen is soft and nontender with no organomegaly, hernias, masses. GU: Deferred  Extremities:  No cyanosis, clubbing, edema  Skin: Warm & dry w/o tenting. No significant lesions or rash.  See summary under each active problem in the Problem List with associated updated therapeutic plan

## 2021-04-04 NOTE — Assessment & Plan Note (Signed)
Today's blood pressure is an outlier; no change indicated.

## 2021-04-04 NOTE — Patient Instructions (Signed)
See assessment and plan under each diagnosis in the problem list and acutely for this visit 

## 2021-04-04 NOTE — Assessment & Plan Note (Signed)
Current creatinine 1.04 / GFR 50  ; CKD Stage 3a Medication List reviewed. No nephrotoxic agents identified.

## 2021-04-04 NOTE — Assessment & Plan Note (Addendum)
On exam diffuse, homogenous dry rales are present worrisome for pulmonary fibrosis rather than interstitial edema.  Daughter denies any history of pulmonary fibrosis or chemotherapy. Daughter states that she intermittently seems markedly dyspneic.  I recommended that morphine be employed with such paroxysmal dyspnea as it would decrease intrathoracic pressures.

## 2021-04-04 NOTE — Assessment & Plan Note (Signed)
Hospice Nurse monitoring her

## 2021-04-04 NOTE — Assessment & Plan Note (Addendum)
Current H/H 9.9/32.5; prior H/H 9.8/32.8 Indices normochromic/cytic despite iron level of 24 Anemia stable to minimally improved   No bleeding dyscrasias reported by Staff

## 2021-04-05 DIAGNOSIS — I509 Heart failure, unspecified: Secondary | ICD-10-CM | POA: Diagnosis not present

## 2021-04-05 DIAGNOSIS — Z950 Presence of cardiac pacemaker: Secondary | ICD-10-CM | POA: Diagnosis not present

## 2021-04-05 DIAGNOSIS — J449 Chronic obstructive pulmonary disease, unspecified: Secondary | ICD-10-CM | POA: Diagnosis not present

## 2021-04-05 DIAGNOSIS — G311 Senile degeneration of brain, not elsewhere classified: Secondary | ICD-10-CM | POA: Diagnosis not present

## 2021-04-05 DIAGNOSIS — I4891 Unspecified atrial fibrillation: Secondary | ICD-10-CM | POA: Diagnosis not present

## 2021-04-05 DIAGNOSIS — N183 Chronic kidney disease, stage 3 unspecified: Secondary | ICD-10-CM | POA: Diagnosis not present

## 2021-04-06 NOTE — Assessment & Plan Note (Signed)
Multifactorial but chiefly due to untreated colon cancer Hospice appropriate

## 2021-04-08 DIAGNOSIS — G311 Senile degeneration of brain, not elsewhere classified: Secondary | ICD-10-CM | POA: Diagnosis not present

## 2021-04-08 DIAGNOSIS — J449 Chronic obstructive pulmonary disease, unspecified: Secondary | ICD-10-CM | POA: Diagnosis not present

## 2021-04-08 DIAGNOSIS — I4891 Unspecified atrial fibrillation: Secondary | ICD-10-CM | POA: Diagnosis not present

## 2021-04-08 DIAGNOSIS — N183 Chronic kidney disease, stage 3 unspecified: Secondary | ICD-10-CM | POA: Diagnosis not present

## 2021-04-08 DIAGNOSIS — Z950 Presence of cardiac pacemaker: Secondary | ICD-10-CM | POA: Diagnosis not present

## 2021-04-08 DIAGNOSIS — I509 Heart failure, unspecified: Secondary | ICD-10-CM | POA: Diagnosis not present

## 2021-04-12 DIAGNOSIS — I509 Heart failure, unspecified: Secondary | ICD-10-CM | POA: Diagnosis not present

## 2021-04-12 DIAGNOSIS — Z950 Presence of cardiac pacemaker: Secondary | ICD-10-CM | POA: Diagnosis not present

## 2021-04-12 DIAGNOSIS — N183 Chronic kidney disease, stage 3 unspecified: Secondary | ICD-10-CM | POA: Diagnosis not present

## 2021-04-12 DIAGNOSIS — G311 Senile degeneration of brain, not elsewhere classified: Secondary | ICD-10-CM | POA: Diagnosis not present

## 2021-04-12 DIAGNOSIS — J449 Chronic obstructive pulmonary disease, unspecified: Secondary | ICD-10-CM | POA: Diagnosis not present

## 2021-04-12 DIAGNOSIS — I4891 Unspecified atrial fibrillation: Secondary | ICD-10-CM | POA: Diagnosis not present

## 2021-04-17 DIAGNOSIS — I509 Heart failure, unspecified: Secondary | ICD-10-CM | POA: Diagnosis not present

## 2021-04-17 DIAGNOSIS — J449 Chronic obstructive pulmonary disease, unspecified: Secondary | ICD-10-CM | POA: Diagnosis not present

## 2021-04-17 DIAGNOSIS — N183 Chronic kidney disease, stage 3 unspecified: Secondary | ICD-10-CM | POA: Diagnosis not present

## 2021-04-17 DIAGNOSIS — I4891 Unspecified atrial fibrillation: Secondary | ICD-10-CM | POA: Diagnosis not present

## 2021-04-17 DIAGNOSIS — G311 Senile degeneration of brain, not elsewhere classified: Secondary | ICD-10-CM | POA: Diagnosis not present

## 2021-04-17 DIAGNOSIS — Z950 Presence of cardiac pacemaker: Secondary | ICD-10-CM | POA: Diagnosis not present

## 2021-04-19 DIAGNOSIS — I4891 Unspecified atrial fibrillation: Secondary | ICD-10-CM | POA: Diagnosis not present

## 2021-04-19 DIAGNOSIS — J449 Chronic obstructive pulmonary disease, unspecified: Secondary | ICD-10-CM | POA: Diagnosis not present

## 2021-04-19 DIAGNOSIS — Z950 Presence of cardiac pacemaker: Secondary | ICD-10-CM | POA: Diagnosis not present

## 2021-04-19 DIAGNOSIS — I509 Heart failure, unspecified: Secondary | ICD-10-CM | POA: Diagnosis not present

## 2021-04-19 DIAGNOSIS — G311 Senile degeneration of brain, not elsewhere classified: Secondary | ICD-10-CM | POA: Diagnosis not present

## 2021-04-19 DIAGNOSIS — N183 Chronic kidney disease, stage 3 unspecified: Secondary | ICD-10-CM | POA: Diagnosis not present

## 2021-04-24 DIAGNOSIS — N183 Chronic kidney disease, stage 3 unspecified: Secondary | ICD-10-CM | POA: Diagnosis not present

## 2021-04-24 DIAGNOSIS — J449 Chronic obstructive pulmonary disease, unspecified: Secondary | ICD-10-CM | POA: Diagnosis not present

## 2021-04-24 DIAGNOSIS — G311 Senile degeneration of brain, not elsewhere classified: Secondary | ICD-10-CM | POA: Diagnosis not present

## 2021-04-24 DIAGNOSIS — I509 Heart failure, unspecified: Secondary | ICD-10-CM | POA: Diagnosis not present

## 2021-04-24 DIAGNOSIS — I4891 Unspecified atrial fibrillation: Secondary | ICD-10-CM | POA: Diagnosis not present

## 2021-04-24 DIAGNOSIS — Z950 Presence of cardiac pacemaker: Secondary | ICD-10-CM | POA: Diagnosis not present

## 2021-04-25 DIAGNOSIS — I509 Heart failure, unspecified: Secondary | ICD-10-CM | POA: Diagnosis not present

## 2021-04-25 DIAGNOSIS — I639 Cerebral infarction, unspecified: Secondary | ICD-10-CM | POA: Diagnosis not present

## 2021-04-25 DIAGNOSIS — Z950 Presence of cardiac pacemaker: Secondary | ICD-10-CM | POA: Diagnosis not present

## 2021-04-25 DIAGNOSIS — R630 Anorexia: Secondary | ICD-10-CM | POA: Diagnosis not present

## 2021-04-25 DIAGNOSIS — G311 Senile degeneration of brain, not elsewhere classified: Secondary | ICD-10-CM | POA: Diagnosis not present

## 2021-04-25 DIAGNOSIS — I4891 Unspecified atrial fibrillation: Secondary | ICD-10-CM | POA: Diagnosis not present

## 2021-04-25 DIAGNOSIS — N3946 Mixed incontinence: Secondary | ICD-10-CM | POA: Diagnosis not present

## 2021-04-25 DIAGNOSIS — J449 Chronic obstructive pulmonary disease, unspecified: Secondary | ICD-10-CM | POA: Diagnosis not present

## 2021-04-25 DIAGNOSIS — N183 Chronic kidney disease, stage 3 unspecified: Secondary | ICD-10-CM | POA: Diagnosis not present

## 2021-04-26 DIAGNOSIS — I4891 Unspecified atrial fibrillation: Secondary | ICD-10-CM | POA: Diagnosis not present

## 2021-04-26 DIAGNOSIS — I509 Heart failure, unspecified: Secondary | ICD-10-CM | POA: Diagnosis not present

## 2021-04-26 DIAGNOSIS — G311 Senile degeneration of brain, not elsewhere classified: Secondary | ICD-10-CM | POA: Diagnosis not present

## 2021-04-26 DIAGNOSIS — J449 Chronic obstructive pulmonary disease, unspecified: Secondary | ICD-10-CM | POA: Diagnosis not present

## 2021-04-26 DIAGNOSIS — N183 Chronic kidney disease, stage 3 unspecified: Secondary | ICD-10-CM | POA: Diagnosis not present

## 2021-04-26 DIAGNOSIS — Z950 Presence of cardiac pacemaker: Secondary | ICD-10-CM | POA: Diagnosis not present

## 2021-05-01 DIAGNOSIS — I4891 Unspecified atrial fibrillation: Secondary | ICD-10-CM | POA: Diagnosis not present

## 2021-05-01 DIAGNOSIS — I509 Heart failure, unspecified: Secondary | ICD-10-CM | POA: Diagnosis not present

## 2021-05-01 DIAGNOSIS — Z950 Presence of cardiac pacemaker: Secondary | ICD-10-CM | POA: Diagnosis not present

## 2021-05-01 DIAGNOSIS — G311 Senile degeneration of brain, not elsewhere classified: Secondary | ICD-10-CM | POA: Diagnosis not present

## 2021-05-01 DIAGNOSIS — J449 Chronic obstructive pulmonary disease, unspecified: Secondary | ICD-10-CM | POA: Diagnosis not present

## 2021-05-01 DIAGNOSIS — N183 Chronic kidney disease, stage 3 unspecified: Secondary | ICD-10-CM | POA: Diagnosis not present

## 2021-05-03 DIAGNOSIS — J449 Chronic obstructive pulmonary disease, unspecified: Secondary | ICD-10-CM | POA: Diagnosis not present

## 2021-05-03 DIAGNOSIS — G311 Senile degeneration of brain, not elsewhere classified: Secondary | ICD-10-CM | POA: Diagnosis not present

## 2021-05-03 DIAGNOSIS — N183 Chronic kidney disease, stage 3 unspecified: Secondary | ICD-10-CM | POA: Diagnosis not present

## 2021-05-03 DIAGNOSIS — I509 Heart failure, unspecified: Secondary | ICD-10-CM | POA: Diagnosis not present

## 2021-05-03 DIAGNOSIS — I4891 Unspecified atrial fibrillation: Secondary | ICD-10-CM | POA: Diagnosis not present

## 2021-05-03 DIAGNOSIS — Z950 Presence of cardiac pacemaker: Secondary | ICD-10-CM | POA: Diagnosis not present

## 2021-05-04 ENCOUNTER — Encounter: Payer: Self-pay | Admitting: Adult Health

## 2021-05-04 ENCOUNTER — Non-Acute Institutional Stay (SKILLED_NURSING_FACILITY): Payer: Medicare Other | Admitting: Adult Health

## 2021-05-04 DIAGNOSIS — J439 Emphysema, unspecified: Secondary | ICD-10-CM

## 2021-05-04 DIAGNOSIS — I130 Hypertensive heart and chronic kidney disease with heart failure and stage 1 through stage 4 chronic kidney disease, or unspecified chronic kidney disease: Secondary | ICD-10-CM

## 2021-05-04 DIAGNOSIS — I639 Cerebral infarction, unspecified: Secondary | ICD-10-CM | POA: Diagnosis not present

## 2021-05-04 DIAGNOSIS — N1832 Chronic kidney disease, stage 3b: Secondary | ICD-10-CM | POA: Diagnosis not present

## 2021-05-04 DIAGNOSIS — Z66 Do not resuscitate: Secondary | ICD-10-CM

## 2021-05-04 DIAGNOSIS — I5032 Chronic diastolic (congestive) heart failure: Secondary | ICD-10-CM | POA: Diagnosis not present

## 2021-05-04 DIAGNOSIS — I480 Paroxysmal atrial fibrillation: Secondary | ICD-10-CM | POA: Diagnosis not present

## 2021-05-04 NOTE — Progress Notes (Signed)
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 She continues to be followed by hospice care. There are no reports of uncontrolled pain. No reports of agitation or anxiety.   Past Medical History:  Diagnosis Date   AKI (acute kidney injury) ([place])    Anemia    Aortic stenosis    Status post TAVR 2017   Arthritis    Asthma    Atrial fibrillation (HCC)    AVM (arteriovenous malformation) of small bowel, acquired    CKD (chronic kidney disease) stage 3, GFR 30-59 ml/min (HCC)    Colon cancer (HCC)    Dementia (HCC)    Essential hypertension    GERD (gastroesophageal reflux disease)    GI bleed    Glaucoma    Hearing loss    History of pneumonia    Pacemaker    Medtronic   Recurrent UTI    Stroke [facility])    SVT (supraventricular tachycardia) (HCC)    S/P ablation of AVNRT in  2003    Past Surgical History:  Procedure Laterality Date   APPENDECTOMY     BIOPSY  04/07/2018   Procedure: BIOPSY;  Surgeon: Jerene Bears, MD;  Location: [place];  Service: Gastroenterology;;   BIOPSY  02/19/2020   Procedure: BIOPSY;  Surgeon: Harvel Quale, MD;  [facility];  Service: Gastroenterology;;  gastric    BLADDER SURGERY     CARDIAC CATHETERIZATION N/A 09/26/2015   Procedure: Right/Left Heart Cath and Coronary Angiography;  Surgeon: Burnell Blanks, MD;  Location: [place] CV LAB;  Service: Cardiovascular;  Laterality: N/A;   CARDIAC SURGERY     CARDIOVERSION N/A 03/02/2016   Procedure: CARDIOVERSION;  Surgeon: Thayer Headings, MD;  Location: [place];  Service: Cardiovascular;  Laterality: N/A;   ENTEROSCOPY  02/19/2020   Procedure: ENTEROSCOPY;  Surgeon: Harvel Quale, MD;  [facility];  Service: Gastroenterology;;   EP IMPLANTABLE DEVICE N/A 10/26/2015   Procedure: Pacemaker Implant;  Surgeon: Will Meredith Leeds, MD;  Location: [place] CV LAB;  Service: Cardiovascular;  Laterality: N/A;   ESOPHAGOGASTRODUODENOSCOPY (EGD) WITH PROPOFOL N/A 04/07/2018   Procedure: ESOPHAGOGASTRODUODENOSCOPY (EGD) WITH PROPOFOL;  Surgeon: Jerene Bears, MD;  Location: [facility] ENDOSCOPY;  Service: Gastroenterology;  Laterality: N/A;   ESOPHAGOGASTRODUODENOSCOPY (EGD) WITH PROPOFOL N/A 10/12/2019   Procedure:  ESOPHAGOGASTRODUODENOSCOPY (EGD) WITH PROPOFOL;  Surgeon: Danie Binder, MD;  [facility];  Service: Endoscopy;  Laterality: N/A;   EYE SURGERY Bilateral    cataract surgery   FLEXIBLE SIGMOIDOSCOPY  02/19/2020   Procedure: FLEXIBLE SIGMOIDOSCOPY;  Surgeon: Harvel Quale, MD;  [facility];  Service: Gastroenterology;;   HIP FRACTURE SURGERY Left 02/2020   HOT HEMOSTASIS N/A 04/07/2018   Procedure: HOT HEMOSTASIS (ARGON PLASMA COAGULATION/BICAP);  Surgeon: Jerene Bears, MD;  Location: [facility]  ENDOSCOPY;  Service: Gastroenterology;  Laterality: N/A;   HOT HEMOSTASIS  10/12/2019   Procedure: HOT HEMOSTASIS (ARGON PLASMA COAGULATION/BICAP);  Surgeon: Danie Binder, MD;  [facility];  Service: Endoscopy;;   INTRAMEDULLARY (IM) NAIL INTERTROCHANTERIC Left 03/10/2020   Procedure: OPEN TREATMENT INTERNAL FIXATION LEFT HIP (WITH GAMMA NAIL);  Surgeon: Carole Civil, MD;  Location: [facility];  Service: Orthopedics;  Laterality: Left;   NASAL SINUS SURGERY     PACEMAKER INSERTION     TEE WITHOUT CARDIOVERSION N/A 10/25/2015   Procedure: TRANSESOPHAGEAL ECHOCARDIOGRAM (TEE);  Surgeon: Burnell Blanks, MD;  Location: [place];  Service: Open Heart Surgery;  Laterality: N/A;   TRANSCATHETER AORTIC VALVE REPLACEMENT, TRANSFEMORAL Right 10/25/2015   Procedure: TRANSCATHETER AORTIC VALVE REPLACEMENT, TRANSFEMORAL;  Surgeon: Burnell Blanks, MD;  Location: [place];  Service: Open Heart Surgery;  Laterality: Right;    Social History   Socioeconomic History   Marital status: Widowed    Spouse name: Not on file   Number of children: 3   Years of education: Not on file   Highest education level: 12th grade  Occupational History   Occupation: Retired  Tobacco Use   Smoking status: Never   Smokeless tobacco: Never  Vaping Use   Vaping Use: Never used  Substance and Sexual Activity   Alcohol use: No    Alcohol/week: 0.0 standard drinks   Drug use: No   Sexual activity: Not Currently  Other Topics Concern   Not on file  Social History Narrative   Long term resident of [facility]    Social Determinants of Health   Financial Resource Strain: Not on file  Food Insecurity: Not on file  Transportation Needs: Not on file  Physical Activity: Not on file  Stress: Not on file  Social Connections: Not on file  Intimate Partner Violence: Not on file   Family History  Problem Relation Age of Onset   Hypertension Mother    Pneumonia Father    Stomach cancer Brother    Stroke  Brother    AAA (abdominal aortic aneurysm) Brother    Cervical cancer Daughter    Heart attack Neg Hx    Colon cancer Neg Hx    Esophageal cancer Neg Hx       VITAL SIGNS BP (!) 162/74   Pulse 86   Temp (!) 97.5 F (36.4 C)   Resp 16   Ht 5' 1"  (1.549 m)   Wt 85 lb 9.6 oz (38.8 kg)   SpO2 98%   BMI 16.17 kg/m   Outpatient Encounter Medications as of 05/04/2021  Medication Sig   acetaminophen (TYLENOL) 325 MG tablet Take 2 tablets (650 mg total) by mouth every 4 (four) hours as needed for mild pain (or temp > 37.5 C (99.5 F)).   albuterol (PROAIR HFA) 108 (90 Base) MCG/ACT inhaler Inhale 2 puffs into the lungs every 4 (four) hours as needed for wheezing or shortness of breath.   Balsam [place]-Castor  Oil OINT Apply 1 application topically daily. Apply to sacrum and bilateral buttocks every shift   busPIRone (BUSPAR) 7.5 MG tablet Take 7.5 mg by mouth 3 (three) times daily.   docusate (COLACE) 50 MG/5ML liquid Take 100 mg by mouth 2 (two) times daily.   famotidine (PEPCID) 20 MG tablet Take 1 tablet (20 mg total) by mouth at bedtime.   levalbuterol (XOPENEX) 1.25 MG/3ML nebulizer solution Take 1.25 mg by nebulization every 4 (four) hours as needed for wheezing. Dx J45.909   LORazepam (ATIVAN) 0.5 MG tablet Take 0.5 mg by mouth every 8 (eight) hours as needed for anxiety.   morphine (ROXANOL) 20 MG/ML concentrated solution Take 0.25 mLs (5 mg total) by mouth every 4 (four) hours as needed for severe pain.   NON FORMULARY Diet: Puree due to poorly fitting dentures and family request   Nutritional Supplements (ENSURE CLEAR) LIQD Take 1 Bottle by mouth daily.   ondansetron (ZOFRAN-ODT) 4 MG disintegrating tablet Take 4 mg by mouth every 6 (six) hours as needed for nausea or vomiting.   [DISCONTINUED] busPIRone (BUSPAR) 5 MG tablet Take 5 mg by mouth 3 (three) times daily. (Patient not taking: Reported on 05/04/2021)   [DISCONTINUED] LORazepam (LORAZEPAM INTENSOL) 2 MG/ML concentrated  solution Take 0.3 mLs (0.6 mg total) by mouth every 8 (eight) hours as needed for anxiety.   No facility-administered encounter medications on file as of 05/04/2021.     SIGNIFICANT DIAGNOSTIC EXAMS   PREVIOUS   08-13-20: ct head:  No acute abnormality. Atrophy and chronic microvascular ischemic disease.  08-13-20: ct angio of head and neck:  1. 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 2. 26 mL ischemic penumbra in the left MCA territory without evidence of a core infarct. 3. Mild-to-moderate cervical carotid atherosclerosis without significant stenosis. 4. Aortic Atherosclerosis   08-14-20: carotid doppler: Color duplex indicates minimal heterogeneous and calcified plaque, with no hemodynamically significant stenosis by duplex criteria in the extracranial cerebrovascular circulation.  08-17-20: ct of chest; abdomen and pelvis Chest Impression: 1. Scattered peribronchial thickening in LEFT and RIGHT lung suggest mild pulmonary inflammation or infection. 2. Focus of consolidation with pleural fluid at the RIGHT lung base differential include pneumonia or round atelectasis.   Abdomen / Pelvis Impression: 1. Circumferential submucosal thickening over 6 cm segment of the ascending colon. 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 Recommend endoscopy for further evaluation. 2. No evidence of metastatic adenopathy other than a small retrocrural lymph node which is favored unrelated 3. No liver metastasis. 4. Multiple diverticula bladder.  NO NEW EXAMS.   LABS REVIEWED PREVIOUS   08-13-20; wbc 7.5; hgb 9.4; hct 31.8; mcv 95.8 plt 270; glucose 122; bun 24; creat 1.28; k+ 3.9; na++ 135; ca 8.7 liver normal albumin 2.9 08-14-20; hgb a1c 4.7; chol 134; ldl 66; trig 96; hdl 49 08-17-20: wbc 7.7; hgb 9.4; hct 30.4; mcv 94.4 plt 242; glucose 111; bun 17; creat 1.16; k+ 3.7; na++ 136; ca 8.9 liver normal albumin 2.7  09-05-20: wbc 8.0; hgb 9.8; hct 31.5; mcv 94.6 plt 245; glucose  123; bun 22; creat 1.55; k+ 4.1; na++ 131; ca 8.5 GFR 21 liver normal albumin 2.9; iron 26; ferritin 222; vit B 12: >7500 09-17-20: tsh 1.637  11-09-20: wbc 4.4; hgb 8.7; hct 29.4; mcv 97.4 plt 180; glucose 99; bun 24; creat 1.16; k+ 4.1; na++ 134; ca 7.8; GFR 45; alk phos 136; albumin 2.2; CEA 8.1; iron 30; tibc 187; ferritin 120   NO NEW LABS.  Review of Systems  Unable to perform ROS: Dementia (unable to participate)   Physical Exam Constitutional:      General: She is not in acute distress.    Appearance: She is cachectic. She is not diaphoretic.  Neck:     Thyroid: No thyromegaly.  Cardiovascular:     Rate and Rhythm: Normal rate and regular rhythm.     Heart sounds: Normal heart sounds.  Pulmonary:     Effort: Pulmonary effort is normal. No respiratory distress.     Breath sounds: Normal breath sounds.  Abdominal:     General: Bowel sounds are normal. There is no distension.     Palpations: Abdomen is soft.     Tenderness: There is no abdominal tenderness.  Musculoskeletal:        General: Normal range of motion.     Cervical back: Neck supple.     Right lower leg: No edema.     Left lower leg: No edema.  Lymphadenopathy:     Cervical: No cervical adenopathy.  Skin:    General: Skin is warm and dry.  Neurological:     Mental Status: She is alert. Mental status is at baseline.  Psychiatric:        Mood and Affect: Mood normal.    ASSESSMENT/ PLAN:  TODAY  Cerebral vascular accident (CVA) unspecified mechanism: is stable will monitor   2. PAF (paroxsymal atrial fibrillation) heart rate is stable; has pace maker; will continue lopressor 12.5 mg twice daily for rate control   3. 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 Pulmonary emphysema unspecified emphysema type: is stable unable to utilize breztri. Will conitue  albumin 2 puffs every 4 hours as needed; xopenex 1.25 mg neb every 4 hours as needed.    PREVIOUS   5. Colon cancer of colon unspecified part of colon: due to her advanced age and dementia; no aggressive workup or treatment CEA 8.1   6. Gastroesophageal reflux disease without esophagitis: is stable will continue protonix 40 mg twice daily   7. Alzheimer's disease without behavioral disturbance; unspecified age of onset: is losing weight her current weight is 85 pounds; unfortunately this is an expected outcome int he late stages of this disease.   8. Failure to thrive in adult/moderate protein calorie malnutrition: is without significant change albumin is 2.2; she continues to lose weight.   9. Iron deficiency anemia due to chronic blood loss:  hgb 8.7 will continue niferex daily   10. Chronic constipation: is stable will continue colace twice daily   11. Vitamin B 12 deficiency: is stable level >7500 will monitor   12. 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 Aortic atherosclerosis: (ct 08-17-20) will monitor   14. Chronic diastolic congestive heart failure: is stable EF 60-65% (08-16-20) will continue lasix 40 mg daily with k+ 20 meq daily         Ok Edwards NP [facility]  Contact [phone] Monday through Friday 8am- 5pm  After hours call [phone]

## 2021-05-05 DIAGNOSIS — I4891 Unspecified atrial fibrillation: Secondary | ICD-10-CM | POA: Diagnosis not present

## 2021-05-05 DIAGNOSIS — G311 Senile degeneration of brain, not elsewhere classified: Secondary | ICD-10-CM | POA: Diagnosis not present

## 2021-05-05 DIAGNOSIS — N183 Chronic kidney disease, stage 3 unspecified: Secondary | ICD-10-CM | POA: Diagnosis not present

## 2021-05-05 DIAGNOSIS — I509 Heart failure, unspecified: Secondary | ICD-10-CM | POA: Diagnosis not present

## 2021-05-05 DIAGNOSIS — Z950 Presence of cardiac pacemaker: Secondary | ICD-10-CM | POA: Diagnosis not present

## 2021-05-05 DIAGNOSIS — J449 Chronic obstructive pulmonary disease, unspecified: Secondary | ICD-10-CM | POA: Diagnosis not present

## 2021-05-10 DIAGNOSIS — I4891 Unspecified atrial fibrillation: Secondary | ICD-10-CM | POA: Diagnosis not present

## 2021-05-10 DIAGNOSIS — N183 Chronic kidney disease, stage 3 unspecified: Secondary | ICD-10-CM | POA: Diagnosis not present

## 2021-05-10 DIAGNOSIS — I509 Heart failure, unspecified: Secondary | ICD-10-CM | POA: Diagnosis not present

## 2021-05-10 DIAGNOSIS — J449 Chronic obstructive pulmonary disease, unspecified: Secondary | ICD-10-CM | POA: Diagnosis not present

## 2021-05-10 DIAGNOSIS — Z950 Presence of cardiac pacemaker: Secondary | ICD-10-CM | POA: Diagnosis not present

## 2021-05-10 DIAGNOSIS — G311 Senile degeneration of brain, not elsewhere classified: Secondary | ICD-10-CM | POA: Diagnosis not present

## 2021-05-12 ENCOUNTER — Encounter (HOSPITAL_COMMUNITY): Payer: Self-pay | Admitting: Hematology

## 2021-05-12 ENCOUNTER — Encounter: Payer: Self-pay | Admitting: Adult Health

## 2021-05-12 ENCOUNTER — Non-Acute Institutional Stay (SKILLED_NURSING_FACILITY): Payer: Medicare Other | Admitting: Adult Health

## 2021-05-12 DIAGNOSIS — I700 Atherosclerosis of aorta: Secondary | ICD-10-CM | POA: Diagnosis not present

## 2021-05-12 DIAGNOSIS — C189 Malignant neoplasm of colon, unspecified: Secondary | ICD-10-CM | POA: Diagnosis not present

## 2021-05-12 DIAGNOSIS — F0390 Unspecified dementia without behavioral disturbance: Secondary | ICD-10-CM | POA: Diagnosis not present

## 2021-05-12 NOTE — Progress Notes (Signed)
Location:  [place] Room Number: 142-D Place of Service:  SNF (31) Provider: Ok Edwards, NP  CODE STATUS: DNR  Allergies  Allergen Reactions   Hctz [Hydrochlorothiazide] Other (See Comments)    Pt was ill and this affected her kidneys    Aspirin Other (See Comments)    Cardiologist said the patient is to not take this   Codeine Other (See Comments)    Made the patient feel ill, has not had any problems since 1977    Chief Complaint  Patient presents with   Acute Visit    Care plan meeting.    HPI:  We have come together for her care plan meeting. Family present. No BIMS; mood 1/30: not interested in activities. She is dependent assist for her adls. She is incontinent of bladder and bowel. There have been no falls. She is bed bound. Dietary: weight is 85 pounds is slowly losing weight.  puree diet; is fed. Therapy none at this time . She continues to be followed for her chronic illnesses including:   Aortic atherosclerosis   Malignant neoplasm of colon unspecified part of colon Dementia without behavioral disturbance  Past Medical History:  Diagnosis Date   AKI (acute kidney injury) ([place])    Anemia    Aortic stenosis    Status post TAVR 2017   Arthritis    Asthma    Atrial fibrillation (HCC)    AVM (arteriovenous malformation) of small bowel, acquired    CKD (chronic kidney disease) stage 3, GFR 30-59 ml/min (HCC)    Colon cancer (HCC)    Dementia (HCC)    Essential hypertension    GERD (gastroesophageal reflux disease)    GI bleed    Glaucoma    Hearing loss    History of pneumonia    Pacemaker    Medtronic   Recurrent UTI    Stroke [facility])    SVT (supraventricular tachycardia) (HCC)    S/P ablation of AVNRT in 2003    Past Surgical History:  Procedure Laterality Date   APPENDECTOMY     BIOPSY  04/07/2018   Procedure: BIOPSY;  Surgeon: Jerene Bears, MD;  Location: [place];  Service: Gastroenterology;;   BIOPSY  02/19/2020    Procedure: BIOPSY;  Surgeon: Harvel Quale, MD;  [facility];  Service: Gastroenterology;;  gastric    BLADDER SURGERY     CARDIAC CATHETERIZATION N/A 09/26/2015   Procedure: Right/Left Heart Cath and Coronary Angiography;  Surgeon: Burnell Blanks, MD;  Location: [place] [facility];  Service: Cardiovascular;  Laterality: N/A;   CARDIAC SURGERY     CARDIOVERSION N/A 03/02/2016   Procedure: CARDIOVERSION;  Surgeon: Thayer Headings, MD;  Location: [place];  Service: Cardiovascular;  Laterality: N/A;   ENTEROSCOPY  02/19/2020   Procedure: ENTEROSCOPY;  Surgeon: Harvel Quale, MD;  [facility];  Service: Gastroenterology;;   EP IMPLANTABLE DEVICE N/A 10/26/2015   Procedure: Pacemaker Implant;  Surgeon: Will Meredith Leeds, MD;  Location: [place] [facility];  Service: Cardiovascular;  Laterality: N/A;   ESOPHAGOGASTRODUODENOSCOPY (EGD) WITH PROPOFOL N/A 04/07/2018   Procedure: ESOPHAGOGASTRODUODENOSCOPY (EGD) WITH PROPOFOL;  Surgeon: Jerene Bears, MD;  Location: [facility] ENDOSCOPY;  Service: Gastroenterology;  Laterality: N/A;   ESOPHAGOGASTRODUODENOSCOPY (EGD) WITH PROPOFOL N/A 10/12/2019   Procedure: ESOPHAGOGASTRODUODENOSCOPY (EGD) WITH PROPOFOL;  Surgeon: Danie Binder, MD;  [facility];  Service: Endoscopy;  Laterality: N/A;   EYE SURGERY Bilateral  cataract surgery   FLEXIBLE SIGMOIDOSCOPY  02/19/2020   Procedure: FLEXIBLE SIGMOIDOSCOPY;  Surgeon: Harvel Quale, MD;  [facility];  Service: Gastroenterology;;   HIP FRACTURE SURGERY Left 02/2020   HOT HEMOSTASIS N/A 04/07/2018   Procedure: HOT HEMOSTASIS (ARGON PLASMA COAGULATION/BICAP);  Surgeon: Jerene Bears, MD;  Location: [facility] ENDOSCOPY;  Service: Gastroenterology;  Laterality: N/A;   HOT HEMOSTASIS  10/12/2019   Procedure: HOT HEMOSTASIS (ARGON PLASMA COAGULATION/BICAP);  Surgeon: Danie Binder, MD;  [facility];  Service: Endoscopy;;    INTRAMEDULLARY (IM) NAIL INTERTROCHANTERIC Left 03/10/2020   Procedure: OPEN TREATMENT INTERNAL FIXATION LEFT HIP (WITH GAMMA NAIL);  Surgeon: Carole Civil, MD;  Location: [facility];  Service: Orthopedics;  Laterality: Left;   NASAL SINUS SURGERY     PACEMAKER INSERTION     TEE WITHOUT CARDIOVERSION N/A 10/25/2015   Procedure: TRANSESOPHAGEAL ECHOCARDIOGRAM (TEE);  Surgeon: Burnell Blanks, MD;  Location: [place];  Service: Open Heart Surgery;  Laterality: N/A;   TRANSCATHETER AORTIC VALVE REPLACEMENT, TRANSFEMORAL Right 10/25/2015   Procedure: TRANSCATHETER AORTIC VALVE REPLACEMENT, TRANSFEMORAL;  Surgeon: Burnell Blanks, MD;  Location: [place];  Service: Open Heart Surgery;  Laterality: Right;    Social History   Socioeconomic History   Marital status: Widowed    Spouse name: Not on file   Number of children: 3   Years of education: Not on file   Highest education level: 12th grade  Occupational History   Occupation: Retired  Tobacco Use   Smoking status: Never   Smokeless tobacco: Never  Vaping Use   Vaping Use: Never used  Substance and Sexual Activity   Alcohol use: No    Alcohol/week: 0.0 standard drinks   Drug use: No   Sexual activity: Not Currently  Other Topics Concern   Not on file  Social History Narrative   [facility] resident of [facility]    Social Determinants of Health   Financial Resource Strain: Not on file  Food Insecurity: Not on file  Transportation Needs: Not on file  Physical Activity: Not on file  Stress: Not on file  Social Connections: Not on file  Intimate Partner Violence: Not on file   Family History  Problem Relation Age of Onset   Hypertension Mother    Pneumonia Father    Stomach cancer Brother    Stroke Brother    AAA (abdominal aortic aneurysm) Brother    Cervical cancer Daughter    Heart attack Neg Hx    Colon cancer Neg Hx    Esophageal cancer Neg Hx       VITAL SIGNS BP 128/70   Pulse 69   Temp 97.6 F (36.4 C)    Resp 16   Ht $R'5\' 1"'Th$  (1.549 m)   Wt 85 lb 9.6 oz (38.8 kg)   SpO2 98%   BMI 16.17 kg/m   Outpatient Encounter Medications as of 05/12/2021  Medication Sig   acetaminophen (TYLENOL) 325 MG tablet Take 2 tablets (650 mg total) by mouth every 4 (four) hours as needed for mild pain (or temp > 37.5 C (99.5 F)).   albuterol (PROAIR HFA) 108 (90 Base) MCG/ACT inhaler Inhale 2 puffs into the lungs every 4 (four) hours as needed for wheezing or shortness of breath.   Balsam [place]-Castor Oil OINT Apply 1 application topically daily. Apply to sacrum and bilateral buttocks every shift   busPIRone (BUSPAR) 7.5 MG tablet Take 7.5 mg by mouth 3 (three) times daily.  docusate (COLACE) 50 MG/5ML liquid Take 100 mg by mouth 2 (two) times daily.   famotidine (PEPCID) 20 MG tablet Take 1 tablet (20 mg total) by mouth at bedtime.   levalbuterol (XOPENEX) 1.25 MG/3ML nebulizer solution Take 1.25 mg by nebulization every 4 (four) hours as needed for wheezing. Dx J45.909   morphine (ROXANOL) 20 MG/ML concentrated solution Take 0.25 mLs (5 mg total) by mouth every 4 (four) hours as needed for severe pain.   NON FORMULARY Diet: Puree due to poorly fitting dentures and family request   Nutritional Supplements (ENSURE CLEAR) LIQD Take 1 Bottle by mouth daily.   ondansetron (ZOFRAN-ODT) 4 MG disintegrating tablet Take 4 mg by mouth every 6 (six) hours as needed for nausea or vomiting.   [DISCONTINUED] LORazepam (ATIVAN) 0.5 MG tablet Take 0.5 mg by mouth every 8 (eight) hours as needed for anxiety.   No facility-administered encounter medications on file as of 05/12/2021.     SIGNIFICANT DIAGNOSTIC EXAMS  PREVIOUS   08-13-20: ct head:  No acute abnormality. Atrophy and chronic microvascular ischemic disease.  08-13-20: ct angio of head and neck:  1. 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 2. 26 mL ischemic penumbra in the left MCA territory without evidence of a core infarct. 3. Mild-to-moderate  cervical carotid atherosclerosis without significant stenosis. 4. Aortic Atherosclerosis   08-14-20: carotid doppler: Color duplex indicates minimal heterogeneous and calcified plaque, with no hemodynamically significant stenosis by duplex criteria in the extracranial cerebrovascular circulation.  08-17-20: ct of chest; abdomen and pelvis Chest Impression: 1. Scattered peribronchial thickening in LEFT and RIGHT lung suggest mild pulmonary inflammation or infection. 2. Focus of consolidation with pleural fluid at the RIGHT lung base differential include pneumonia or round atelectasis.   Abdomen / Pelvis Impression: 1. Circumferential submucosal thickening over 6 cm segment of the ascending colon. 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 Recommend endoscopy for further evaluation. 2. No evidence of metastatic adenopathy other than a small retrocrural lymph node which is favored unrelated 3. No liver metastasis. 4. Multiple diverticula bladder.  NO NEW EXAMS.   LABS REVIEWED PREVIOUS   08-13-20; wbc 7.5; hgb 9.4; hct 31.8; mcv 95.8 plt 270; glucose 122; bun 24; creat 1.28; k+ 3.9; na++ 135; ca 8.7 liver normal albumin 2.9 08-14-20; hgb a1c 4.7; chol 134; ldl 66; trig 96; hdl 49 08-17-20: wbc 7.7; hgb 9.4; hct 30.4; mcv 94.4 plt 242; glucose 111; bun 17; creat 1.16; k+ 3.7; na++ 136; ca 8.9 liver normal albumin 2.7  09-05-20: wbc 8.0; hgb 9.8; hct 31.5; mcv 94.6 plt 245; glucose 123; bun 22; creat 1.55; k+ 4.1; na++ 131; ca 8.5 GFR 21 liver normal albumin 2.9; iron 26; ferritin 222; vit B 12: >7500 09-17-20: tsh 1.637  11-09-20: wbc 4.4; hgb 8.7; hct 29.4; mcv 97.4 plt 180; glucose 99; bun 24; creat 1.16; k+ 4.1; na++ 134; ca 7.8; GFR 45; alk phos 136; albumin 2.2; CEA 8.1; iron 30; tibc 187; ferritin 120   NO NEW LABS.   Review of Systems  Unable to perform ROS: Dementia (unable to participate)   Physical Exam Constitutional:      General: She is not in acute distress.    Appearance: She  is cachectic. She is not diaphoretic.  Neck:     Thyroid: No thyromegaly.  Cardiovascular:     Rate and Rhythm: Normal rate and regular rhythm.     Pulses: Normal pulses.     Heart sounds: Normal heart sounds.  Pulmonary:  Effort: Pulmonary effort is normal. No respiratory distress.     Breath sounds: Normal breath sounds.  Abdominal:     General: Bowel sounds are normal. There is no distension.     Palpations: Abdomen is soft.     Tenderness: There is no abdominal tenderness.  Musculoskeletal:     Cervical back: Neck supple.     Right lower leg: No edema.     Left lower leg: No edema.  Lymphadenopathy:     Cervical: No cervical adenopathy.  Skin:    General: Skin is warm and dry.  Neurological:     Comments: Is aware       ASSESSMENT/ PLAN:  TODAY  Aortic atherosclerosis  Malignant neoplasm of colon unspecified part of colon Dementia without behavioral disturbance  Will continue current medications Will continue current plan of care Continues with hospice care   Time spent with patient: 40 minutes: care plan; medications; hospice care.    Ok Edwards NP [facility]  Contact [phone] Monday through Friday 8am- 5pm  After hours call [phone]

## 2021-05-15 ENCOUNTER — Encounter: Payer: Self-pay | Admitting: Adult Health

## 2021-05-15 ENCOUNTER — Non-Acute Institutional Stay (SKILLED_NURSING_FACILITY): Payer: Medicare Other | Admitting: Adult Health

## 2021-05-15 DIAGNOSIS — G311 Senile degeneration of brain, not elsewhere classified: Secondary | ICD-10-CM | POA: Diagnosis not present

## 2021-05-15 DIAGNOSIS — C189 Malignant neoplasm of colon, unspecified: Secondary | ICD-10-CM

## 2021-05-15 DIAGNOSIS — I509 Heart failure, unspecified: Secondary | ICD-10-CM | POA: Diagnosis not present

## 2021-05-15 DIAGNOSIS — F0390 Unspecified dementia without behavioral disturbance: Secondary | ICD-10-CM | POA: Diagnosis not present

## 2021-05-15 DIAGNOSIS — N183 Chronic kidney disease, stage 3 unspecified: Secondary | ICD-10-CM | POA: Diagnosis not present

## 2021-05-15 DIAGNOSIS — I4891 Unspecified atrial fibrillation: Secondary | ICD-10-CM | POA: Diagnosis not present

## 2021-05-15 DIAGNOSIS — Z950 Presence of cardiac pacemaker: Secondary | ICD-10-CM | POA: Diagnosis not present

## 2021-05-15 DIAGNOSIS — J449 Chronic obstructive pulmonary disease, unspecified: Secondary | ICD-10-CM | POA: Diagnosis not present

## 2021-05-15 NOTE — Progress Notes (Signed)
Location:  [place] Room Number: 142 Place of Service:  SNF (31)   CODE STATUS: dnr   Allergies  Allergen Reactions   Hctz [Hydrochlorothiazide] Other (See Comments)    Pt was ill and this affected her kidneys    Aspirin Other (See Comments)    Cardiologist said the patient is to not take this   Codeine Other (See Comments)    Made the patient feel ill, has not had any problems since 1977    Chief Complaint  Patient presents with   Acute Visit    End of life     HPI:  She has had a change in status. She continues to be followed by hospice care. She is not resting quietly. She has been given her morphine which is not lasting for 4 hours. Her family is wanting to keep her comfortable and are now ready to stop all routine medications.   Past Medical History:  Diagnosis Date   AKI (acute kidney injury) ([place])    Anemia    Aortic stenosis    Status post TAVR 2017   Arthritis    Asthma    Atrial fibrillation (HCC)    AVM (arteriovenous malformation) of small bowel, acquired    CKD (chronic kidney disease) stage 3, GFR 30-59 ml/min (HCC)    Colon cancer (HCC)    Dementia (HCC)    Essential hypertension    GERD (gastroesophageal reflux disease)    GI bleed    Glaucoma    Hearing loss    History of pneumonia    Pacemaker    [employer]   Recurrent UTI    Stroke [facility])    SVT (supraventricular tachycardia) (HCC)    S/P ablation of AVNRT in 2003    Past Surgical History:  Procedure Laterality Date   APPENDECTOMY     BIOPSY  04/07/2018   Procedure: BIOPSY;  Surgeon: Jerene Bears, MD;  Location: [place];  Service: Gastroenterology;;   BIOPSY  02/19/2020   Procedure: BIOPSY;  Surgeon: Harvel Quale, MD;  [facility];  Service: Gastroenterology;;  gastric    BLADDER SURGERY     CARDIAC CATHETERIZATION N/A 09/26/2015   Procedure: Right/Left Heart Cath and Coronary Angiography;  Surgeon: Burnell Blanks, MD;   Location: [place] CV LAB;  Service: Cardiovascular;  Laterality: N/A;   CARDIAC SURGERY     CARDIOVERSION N/A 03/02/2016   Procedure: CARDIOVERSION;  Surgeon: Thayer Headings, MD;  Location: [place];  Service: Cardiovascular;  Laterality: N/A;   ENTEROSCOPY  02/19/2020   Procedure: ENTEROSCOPY;  Surgeon: Harvel Quale, MD;  [facility];  Service: Gastroenterology;;   EP IMPLANTABLE DEVICE N/A 10/26/2015   Procedure: Pacemaker Implant;  Surgeon: Will Meredith Leeds, MD;  Location: [place] [facility];  Service: Cardiovascular;  Laterality: N/A;   ESOPHAGOGASTRODUODENOSCOPY (EGD) WITH PROPOFOL N/A 04/07/2018   Procedure: ESOPHAGOGASTRODUODENOSCOPY (EGD) WITH PROPOFOL;  Surgeon: Jerene Bears, MD;  Location: [facility] ENDOSCOPY;  Service: Gastroenterology;  Laterality: N/A;   ESOPHAGOGASTRODUODENOSCOPY (EGD) WITH PROPOFOL N/A 10/12/2019   Procedure: ESOPHAGOGASTRODUODENOSCOPY (EGD) WITH PROPOFOL;  Surgeon: Danie Binder, MD;  [facility];  Service: Endoscopy;  Laterality: N/A;   EYE SURGERY Bilateral    cataract surgery   FLEXIBLE SIGMOIDOSCOPY  02/19/2020   Procedure: FLEXIBLE SIGMOIDOSCOPY;  Surgeon: Harvel Quale, MD;  [facility];  Service: Gastroenterology;;   HIP FRACTURE SURGERY Left 02/2020   HOT HEMOSTASIS  N/A 04/07/2018   Procedure: HOT HEMOSTASIS (ARGON PLASMA COAGULATION/BICAP);  Surgeon: Jerene Bears, MD;  Location: [facility] ENDOSCOPY;  Service: Gastroenterology;  Laterality: N/A;   HOT HEMOSTASIS  10/12/2019   Procedure: HOT HEMOSTASIS (ARGON PLASMA COAGULATION/BICAP);  Surgeon: Danie Binder, MD;  [facility];  Service: Endoscopy;;   INTRAMEDULLARY (IM) NAIL INTERTROCHANTERIC Left 03/10/2020   Procedure: OPEN TREATMENT INTERNAL FIXATION LEFT HIP (WITH GAMMA NAIL);  Surgeon: Carole Civil, MD;  Location: [facility];  Service: Orthopedics;  Laterality: Left;   NASAL SINUS SURGERY     PACEMAKER INSERTION     TEE  WITHOUT CARDIOVERSION N/A 10/25/2015   Procedure: TRANSESOPHAGEAL ECHOCARDIOGRAM (TEE);  Surgeon: Burnell Blanks, MD;  Location: [place];  Service: Open Heart Surgery;  Laterality: N/A;   TRANSCATHETER AORTIC VALVE REPLACEMENT, TRANSFEMORAL Right 10/25/2015   Procedure: TRANSCATHETER AORTIC VALVE REPLACEMENT, TRANSFEMORAL;  Surgeon: Burnell Blanks, MD;  Location: [place];  Service: Open Heart Surgery;  Laterality: Right;    Social History   Socioeconomic History   Marital status: Widowed    Spouse name: Not on file   Number of children: 3   Years of education: Not on file   Highest education level: 12th grade  Occupational History   Occupation: Retired  Tobacco Use   Smoking status: Never   Smokeless tobacco: Never  Vaping Use   Vaping Use: Never used  Substance and Sexual Activity   Alcohol use: No    Alcohol/week: 0.0 standard drinks   Drug use: No   Sexual activity: Not Currently  Other Topics Concern   Not on file  Social History Narrative   Long term resident of [facility]    Social Determinants of Health   Financial Resource Strain: Not on file  Food Insecurity: Not on file  Transportation Needs: Not on file  Physical Activity: Not on file  Stress: Not on file  Social Connections: Not on file  Intimate Partner Violence: Not on file   Family History  Problem Relation Age of Onset   Hypertension Mother    Pneumonia Father    Stomach cancer Brother    Stroke Brother    AAA (abdominal aortic aneurysm) Brother    Cervical cancer Daughter    Heart attack Neg Hx    Colon cancer Neg Hx    Esophageal cancer Neg Hx       VITAL SIGNS BP 108/60   Pulse 64   Temp (!) 97.3 F (36.3 C)   Ht 5' 1"  (1.549 m)   Wt 85 lb 9.6 oz (38.8 kg)   BMI 16.17 kg/m   Outpatient Encounter Medications as of 05/15/2021  Medication Sig   acetaminophen (TYLENOL) 325 MG tablet Take 2 tablets (650 mg total) by mouth every 4 (four) hours as needed for mild pain (or temp > 37.5 C  (99.5 F)).   albuterol (PROAIR HFA) 108 (90 Base) MCG/ACT inhaler Inhale 2 puffs into the lungs every 4 (four) hours as needed for wheezing or shortness of breath.   Balsam [place]-Castor Oil OINT Apply 1 application topically daily. Apply to sacrum and bilateral buttocks every shift   busPIRone (BUSPAR) 7.5 MG tablet Take 7.5 mg by mouth 3 (three) times daily.   docusate (COLACE) 50 MG/5ML liquid Take 100 mg by mouth 2 (two) times daily.   famotidine (PEPCID) 20 MG tablet Take 1 tablet (20 mg total) by mouth at bedtime.   levalbuterol (XOPENEX) 1.25 MG/3ML nebulizer solution Take 1.25 mg by  nebulization every 4 (four) hours as needed for wheezing. Dx J45.909   morphine (ROXANOL) 20 MG/ML concentrated solution Take 0.25 mLs (5 mg total) by mouth every 4 (four) hours as needed for severe pain.   NON FORMULARY Diet: Puree due to poorly fitting dentures and family request   Nutritional Supplements (ENSURE CLEAR) LIQD Take 1 Bottle by mouth daily.   ondansetron (ZOFRAN-ODT) 4 MG disintegrating tablet Take 4 mg by mouth every 6 (six) hours as needed for nausea or vomiting.   No facility-administered encounter medications on file as of 05/15/2021.     SIGNIFICANT DIAGNOSTIC EXAMS  PREVIOUS   08-13-20: ct head:  No acute abnormality. Atrophy and chronic microvascular ischemic disease.  08-13-20: ct angio of head and neck:  1. 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 2. 26 mL ischemic penumbra in the left MCA territory without evidence of a core infarct. 3. Mild-to-moderate cervical carotid atherosclerosis without significant stenosis. 4. Aortic Atherosclerosis   08-14-20: carotid doppler: Color duplex indicates minimal heterogeneous and calcified plaque, with no hemodynamically significant stenosis by duplex criteria in the extracranial cerebrovascular circulation.  08-17-20: ct of chest; abdomen and pelvis Chest Impression: 1. Scattered peribronchial thickening in LEFT and RIGHT lung  suggest mild pulmonary inflammation or infection. 2. Focus of consolidation with pleural fluid at the RIGHT lung base differential include pneumonia or round atelectasis.   Abdomen / Pelvis Impression: 1. Circumferential submucosal thickening over 6 cm segment of the ascending colon. 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 Recommend endoscopy for further evaluation. 2. No evidence of metastatic adenopathy other than a small retrocrural lymph node which is favored unrelated 3. No liver metastasis. 4. Multiple diverticula bladder.  NO NEW EXAMS.   LABS REVIEWED PREVIOUS   08-13-20; wbc 7.5; hgb 9.4; hct 31.8; mcv 95.8 plt 270; glucose 122; bun 24; creat 1.28; k+ 3.9; na++ 135; ca 8.7 liver normal albumin 2.9 08-14-20; hgb a1c 4.7; chol 134; ldl 66; trig 96; hdl 49 08-17-20: wbc 7.7; hgb 9.4; hct 30.4; mcv 94.4 plt 242; glucose 111; bun 17; creat 1.16; k+ 3.7; na++ 136; ca 8.9 liver normal albumin 2.7  09-05-20: wbc 8.0; hgb 9.8; hct 31.5; mcv 94.6 plt 245; glucose 123; bun 22; creat 1.55; k+ 4.1; na++ 131; ca 8.5 GFR 21 liver normal albumin 2.9; iron 26; ferritin 222; vit B 12: >7500 09-17-20: tsh 1.637  11-09-20: wbc 4.4; hgb 8.7; hct 29.4; mcv 97.4 plt 180; glucose 99; bun 24; creat 1.16; k+ 4.1; na++ 134; ca 7.8; GFR 45; alk phos 136; albumin 2.2; CEA 8.1; iron 30; tibc 187; ferritin 120   NO NEW LABS.   Review of Systems  Unable to perform ROS: Dementia (unable to participate)   Physical Exam Constitutional:      General: She is not in acute distress.    Appearance: She is cachectic. She is not diaphoretic.  Neck:     Thyroid: No thyromegaly.  Cardiovascular:     Rate and Rhythm: Normal rate and regular rhythm.     Pulses: Normal pulses.     Heart sounds: Normal heart sounds.  Pulmonary:     Effort: Pulmonary effort is normal. No respiratory distress.     Breath sounds: Normal breath sounds.  Abdominal:     General: Bowel sounds are normal. There is no distension.      Palpations: Abdomen is soft.     Tenderness: There is no abdominal tenderness.  Musculoskeletal:     Cervical back: Neck supple.  Right lower leg: No edema.     Left lower leg: No edema.  Lymphadenopathy:     Cervical: No cervical adenopathy.  Skin:    General: Skin is warm and dry.  Neurological:     Comments: Is aware       ASSESSMENT/ PLAN:  TODAY  Malignant neoplasm of unspecified portion of colon Dementia without behavioral disturbance  Will stop the following medications: buspar; colace; pepcid.  Will change roxanol to 5 mg every 2 hours as needed Will monitor her status and will continue to focus upon comfort only    Ok Edwards NP [facility]  Contact [phone] Monday through Friday 8am- 5pm  After hours call [phone]

## 2021-05-15 NOTE — Progress Notes (Signed)
This encounter was created in error - please disregard.

## 2021-05-16 DIAGNOSIS — N183 Chronic kidney disease, stage 3 unspecified: Secondary | ICD-10-CM | POA: Diagnosis not present

## 2021-05-16 DIAGNOSIS — I4891 Unspecified atrial fibrillation: Secondary | ICD-10-CM | POA: Diagnosis not present

## 2021-05-16 DIAGNOSIS — G311 Senile degeneration of brain, not elsewhere classified: Secondary | ICD-10-CM | POA: Diagnosis not present

## 2021-05-16 DIAGNOSIS — J449 Chronic obstructive pulmonary disease, unspecified: Secondary | ICD-10-CM | POA: Diagnosis not present

## 2021-05-16 DIAGNOSIS — I509 Heart failure, unspecified: Secondary | ICD-10-CM | POA: Diagnosis not present

## 2021-05-16 DIAGNOSIS — Z950 Presence of cardiac pacemaker: Secondary | ICD-10-CM | POA: Diagnosis not present

## 2021-05-17 DIAGNOSIS — N183 Chronic kidney disease, stage 3 unspecified: Secondary | ICD-10-CM | POA: Diagnosis not present

## 2021-05-17 DIAGNOSIS — J449 Chronic obstructive pulmonary disease, unspecified: Secondary | ICD-10-CM | POA: Diagnosis not present

## 2021-05-17 DIAGNOSIS — I4891 Unspecified atrial fibrillation: Secondary | ICD-10-CM | POA: Diagnosis not present

## 2021-05-17 DIAGNOSIS — Z950 Presence of cardiac pacemaker: Secondary | ICD-10-CM | POA: Diagnosis not present

## 2021-05-17 DIAGNOSIS — I509 Heart failure, unspecified: Secondary | ICD-10-CM | POA: Diagnosis not present

## 2021-05-17 DIAGNOSIS — G311 Senile degeneration of brain, not elsewhere classified: Secondary | ICD-10-CM | POA: Diagnosis not present

## 2021-05-25 DEATH — deceased

## 2021-07-26 ENCOUNTER — Ambulatory Visit: Payer: Medicare Other | Admitting: Adult Health
